# Patient Record
Sex: Male | Born: 1965
Health system: Southern US, Community
[De-identification: ages and names within clinical notes are randomized; demographics above are authoritative.]

## PROBLEM LIST (undated history)

## (undated) DIAGNOSIS — S069X9A Unspecified intracranial injury with loss of consciousness of unspecified duration, initial encounter: Secondary | ICD-10-CM

## (undated) DIAGNOSIS — R569 Unspecified convulsions: Secondary | ICD-10-CM

## (undated) DIAGNOSIS — E119 Type 2 diabetes mellitus without complications: Secondary | ICD-10-CM

## (undated) HISTORY — DX: Type 2 diabetes mellitus without complications: E11.9

---

## 2005-01-09 ENCOUNTER — Ambulatory Visit: Payer: Self-pay | Admitting: Internal Medicine

## 2005-02-02 ENCOUNTER — Ambulatory Visit: Payer: Self-pay | Admitting: Internal Medicine

## 2005-02-18 ENCOUNTER — Ambulatory Visit: Payer: Self-pay | Admitting: Internal Medicine

## 2013-10-19 ENCOUNTER — Encounter (HOSPITAL_COMMUNITY): Payer: Self-pay | Admitting: Emergency Medicine

## 2013-10-19 ENCOUNTER — Emergency Department (HOSPITAL_COMMUNITY)
Admission: EM | Admit: 2013-10-19 | Discharge: 2013-10-19 | Disposition: A | Discharge status: Home / Self Care | Payer: BC Managed Care – PPO | Attending: Emergency Medicine | Admitting: Emergency Medicine

## 2013-10-19 DIAGNOSIS — Z792 Long term (current) use of antibiotics: Secondary | ICD-10-CM | POA: Insufficient documentation

## 2013-10-19 DIAGNOSIS — L723 Sebaceous cyst: Secondary | ICD-10-CM | POA: Insufficient documentation

## 2013-10-19 DIAGNOSIS — L720 Epidermal cyst: Secondary | ICD-10-CM

## 2013-10-19 DIAGNOSIS — L0211 Cutaneous abscess of neck: Secondary | ICD-10-CM | POA: Insufficient documentation

## 2013-10-19 DIAGNOSIS — M549 Dorsalgia, unspecified: Secondary | ICD-10-CM | POA: Insufficient documentation

## 2013-10-19 LAB — CBC WITH DIFFERENTIAL/PLATELET
Basophils Relative: 0 % (ref 0–1)
Eosinophils Absolute: 0.1 10*3/uL (ref 0.0–0.7)
Eosinophils Relative: 1 % (ref 0–5)
HCT: 41.2 % (ref 39.0–52.0)
Hemoglobin: 15.2 g/dL (ref 13.0–17.0)
MCH: 30.6 pg (ref 26.0–34.0)
MCHC: 36.9 g/dL — ABNORMAL HIGH (ref 30.0–36.0)
MCV: 83.1 fL (ref 78.0–100.0)
Monocytes Absolute: 0.3 10*3/uL (ref 0.1–1.0)
Monocytes Relative: 4 % (ref 3–12)
Neutro Abs: 5.6 10*3/uL (ref 1.7–7.7)
RBC: 4.96 MIL/uL (ref 4.22–5.81)

## 2013-10-19 MED ORDER — CLINDAMYCIN HCL 150 MG PO CAPS: 450.0000 mg | ORAL_CAPSULE | Freq: Three times a day (TID) | ORAL | Status: DC

## 2013-10-19 MED ORDER — SODIUM CHLORIDE 0.9 % IV BOLUS (SEPSIS)
1000.0000 mL | Freq: Once | INTRAVENOUS | Status: AC
  Administered 2013-10-19: 1000 mL via INTRAVENOUS

## 2013-10-19 NOTE — ED Notes (Signed)
Pt has abscess to back of neck and upper back, sts comes and goes, now has become painful.

## 2013-10-19 NOTE — ED Provider Notes (Signed)
CSN: [id number]     Arrival date & time 10/19/13  4098 History  This chart was scribed for non-physician practitioner, Raymon Mutton, PA-C working with Raeford Razor, MD by Greggory Stallion, ED scribe. This patient was seen in room TR08C/TR08C and the patient's care was started at 10:29 AM.   Chief Complaint  Patient presents with  . Neck Pain  . Abscess   The history is provided by the patient. No language interpreter was used.   HPI Comments: [name] is a 47 y.o. male who presents to the Emergency Department complaining of a recurrent abscess to the back of his neck that started 2-3 weeks ago. He states it has worsened and become more painful in the last 3 days. Pt states palpation causes neck pain and upper back pain. Denies drainage. Denies fever, chills, nausea, emesis, neck stiffness, abnormal weight change. Pt denies family history of cancer.   History reviewed. No pertinent past medical history. History reviewed. No pertinent past surgical history. History reviewed. No pertinent family history. History  Substance Use Topics  . Smoking status: Never Smoker   . Smokeless tobacco: Not on file  . Alcohol Use: Yes     Comment: socially    Review of Systems  Constitutional: Negative for fever, chills and unexpected weight change.  Gastrointestinal: Negative for nausea and vomiting.  Musculoskeletal: Positive for back pain and neck pain. Negative for neck stiffness.  Skin:       Abscess  All other systems reviewed and are negative.    Allergies  Review of patient's allergies indicates no known allergies.  Home Medications   Current Outpatient Rx  Name  Route  Sig  Dispense  Refill  . clindamycin (CLEOCIN) 150 MG capsule   Oral   Take 3 capsules (450 mg total) by mouth 3 (three) times daily.   90 capsule   0     BP 133/79  Pulse 79  Temp(Src) 98 F (36.7 C) (Oral)  Resp 20  Ht 5\' 2"  (1.575 m)  Wt 160 lb (72.576 kg)  BMI 29.26 kg/m2  SpO2  100%  Physical Exam  Nursing note and vitals reviewed. Constitutional: He is oriented to person, place, and time. He appears well-developed and well-nourished. No distress.  HENT:  Head: Normocephalic and atraumatic.  Eyes: EOM are normal. Right eye exhibits no discharge. Left eye exhibits no discharge.  Neck: Normal range of motion. Neck supple. No tracheal deviation present.    Negative neck stiffness Negative nuchal rigidity Negative pain upon palpation to the c-spine  Cardiovascular: Normal rate, regular rhythm and normal heart sounds.  Exam reveals no friction rub.   No murmur heard. Pulses:      Radial pulses are 2+ on the right side, and 2+ on the left side.  Pulmonary/Chest: Effort normal and breath sounds normal. No respiratory distress. He has no wheezes. He has no rales.  Musculoskeletal: Normal range of motion.  Full ROM to upper and lower extremities bilaterally without difficulty noted  Neurological: He is alert and oriented to person, place, and time. He exhibits normal muscle tone. Coordination normal.  Strength 5+/5+ to upper extremities bilaterally with resistance applied, equal distribution noted   Skin: Skin is warm and dry.  Approximately 3 cm x 3 cm indurated lesion localized nears the C7 prominence, leftward deviation from the spine. Negative erythema, inflammation, drainage, bleeding, pustule, warmth upon palpation. Pain upon palpation to the lesion.   Psychiatric: He has a normal mood and affect. His  behavior is normal.    ED Course  Procedures (including critical care time)  DIAGNOSTIC STUDIES: Oxygen Saturation is 100% on RA, normal by my interpretation.    COORDINATION OF CARE: 10:32 AM-Discussed treatment plan which includes I&D with pt at bedside and pt agreed to plan.   INCISION AND DRAINAGE Performed by: Raymon Mutton, PA-C Consent: Verbal consent obtained. Risks and benefits: risks, benefits and alternatives were discussed Type:  abscess Body area: nape of neck, near C7 prominence Anesthesia: local infiltration Incision was made with a scalpel. Local anesthetic: lidocaine 2% with epinephrine Anesthetic total: 1 ml Complexity: complex Blunt dissection to break up loculations Drainage: purulent Drainage amount: 10-15 mL Patient tolerance: Patient tolerated the procedure well with no immediate complications.  12:10 PM Patient reported that he started to feel dizzy. Blood pressure was taken and was 90/64. Patient given fluids and sandwich PO and monitored. Blood pressure continued to be low and patient continued to report that he felt dizzy. Patient placed on IV fluids and monitored blood pressure.   2:07 PM Patient reported that he was feeling better after 2L of IV fluids. Patient reported that he did not eat or drink anything this morning. Blood pressure improved to 114/72. Patient walking around without any neurological deficits noted to gait. Patient reported that he is feeling better and ready to go home. Discussed instructions with patient and wound care. Patient discharged.   Labs Review Labs Reviewed  CBC WITH DIFFERENTIAL - Abnormal; Notable for the following:    MCHC 36.9 (*)    All other components within normal limits  WOUND CULTURE   Imaging Review No results found.  EKG Interpretation   None         MDM   1. Epidermal cyst     Filed Vitals:   10/19/13 1311 10/19/13 1314 10/19/13 1317 10/19/13 1357  BP: 112/70 118/74 108/64 114/72  Pulse: 73 66 68 70  Temp:      TempSrc:      Resp: 16 16 16 16   Height:      Weight:      SpO2: 100% 100% 100% 100%   I personally performed the services described in this documentation, which was scribed in my presence. The recorded information has been reviewed and is accurate.  Patient presenting to emergency department with lesion localized to the back of the neck that has been ongoing for the past 2 weeks. Patient reports that it hurts with  palpation. Reported that the lesion has gotten progressively larger.  Negative drainage, erythema, inflammation, fevers, neck stiffness. Alert and oriented. GCS 15. Heart rate and rhythm normal. Lungs clear to auscultation upper and lower lobes bilaterally. Pulses palpable and strong, radial 2+ bilaterally. Full range of motion to the neck. Cranial nerves grossly intact. Negative cervical lymphadenopathy identified. Approximately 3 cm x 3 cm lesion localized to the C7 prominence, leftward deviation. Indurated. Negative erythema, negative swelling, negative inflammation noted. Negative warmth upon palpation. Incision and drainage performed with immense amount of pus removed and drained. Irrigated and cleaned thoroughly. Wound bandaged. Patient tolerated procedure well. Shortly before discharge patient reported that he felt dizzy. Blood pressure taken and was recorded to be 90/64. Patient given food and fluids PO - continued to have dizziness and low blood pressure. Patient placed on IV fluids and monitored. Patient tolerated and responded well to the 2 L of IV fluids. Patient felt better and blood pressure improved. Blood pressure 114/72 upon discharge.  Suspicion to be epidermal cyst. Doubt  cancer. Doubt tumor. Doubt abscess.Patient stable, afebrile. Discharged patient. Discussed with patient proper hygiene techniques. Discussed with patient to closely monitor symptoms and if symptoms are to worsen or change report back to emergency department - strict return structures given. Patient agreed to plan of care, understood, all questions answered.  Raymon Mutton, PA-C 10/20/13 [address] PA-C 10/20/13 1258

## 2013-10-22 LAB — WOUND CULTURE
Culture: NO GROWTH
Gram Stain: NONE SEEN

## 2013-10-27 NOTE — ED Provider Notes (Signed)
Medical screening examination/treatment/procedure(s) were performed by non-physician practitioner and as supervising physician I was immediately available for consultation/collaboration.  EKG Interpretation    Date/Time:  Thursday October 19 2013 12:28:51 EST Ventricular Rate:  69 PR Interval:  178 QRS Duration: 84 QT Interval:  394 QTC Calculation: 422 R Axis:   55 Text Interpretation:  Normal sinus rhythm Normal ECG ED PHYSICIAN INTERPRETATION AVAILABLE IN [facility] Confirmed by TEST, RECORD ([id number]) on 10/21/2013 8:50:40 AM             Raeford Razor, MD 10/27/13 2158

## 2014-12-31 ENCOUNTER — Ambulatory Visit (INDEPENDENT_AMBULATORY_CARE_PROVIDER_SITE_OTHER): Payer: Self-pay | Admitting: Physician Assistant

## 2014-12-31 VITALS — BP 118/78 | HR 86 | Temp 98.1°F | Resp 16 | Ht 64.75 in | Wt 156.2 lb

## 2014-12-31 DIAGNOSIS — K047 Periapical abscess without sinus: Secondary | ICD-10-CM

## 2014-12-31 DIAGNOSIS — K088 Other specified disorders of teeth and supporting structures: Secondary | ICD-10-CM

## 2014-12-31 DIAGNOSIS — K089 Disorder of teeth and supporting structures, unspecified: Secondary | ICD-10-CM

## 2014-12-31 DIAGNOSIS — K0889 Other specified disorders of teeth and supporting structures: Secondary | ICD-10-CM

## 2014-12-31 MED ORDER — NAPROXEN 500 MG PO TABS: 500.0000 mg | ORAL_TABLET | Freq: Two times a day (BID) | ORAL | Status: DC

## 2014-12-31 MED ORDER — AMOXICILLIN-POT CLAVULANATE 875-125 MG PO TABS: 1.0000 | ORAL_TABLET | Freq: Two times a day (BID) | ORAL | Status: AC

## 2014-12-31 NOTE — Patient Instructions (Signed)
[facility] at [phone] for an appointment.

## 2014-12-31 NOTE — Progress Notes (Signed)
   12/31/2014 at 11:29 AM  [name] / DOB: 1966-07-11 / MRN: [id number]  The patient  does not have a problem list on file.  SUBJECTIVE  Chief compalaint: Dental Pain   History of present illness: [name] is 49 y.o. well appearing male presenting for teeth pain which he states is moderate. This started 7 days ago and is worsening . Associated symptoms include nothing.  He denies difficulty eating, ear pain, change in hearing, nasal and eye symptoms, fevers, chills presyncope, and diaphoresis. Marland Kitchen.  He has tried nothing for this problem. He would have gone to a dentist first, however, he suspected an abscess, as he had on 6 years ago with a similar presentation, and was told he needed antibiotics before any procedure.  His last dental check up was 2 years ago and was "normal."   He  has a past medical history of Diabetes mellitus without complication.    He currently has no medications in their medication list.  [name] has No Known Allergies. He  reports that he has never smoked. He does not have any smokeless tobacco history on file. He reports that he drinks alcohol. He reports that he does not use illicit drugs. He  has no sexual activity history on file. The patient  has no past surgical history on file.  His family history includes Diabetes in his father and mother.  ROS  Per HPI   OBJECTIVE  His  height is 5' 4.75" (1.645 m) and weight is 156 lb 3.2 oz (70.852 kg). His oral temperature is 98.1 F (36.7 C). His blood pressure is 118/78 and his pulse is 86. His respiration is 16 and oxygen saturation is 98%.  The patient's body mass index is 26.18 kg/(m^2).  Physical Exam  Constitutional: Vital signs are normal. He appears well-developed and well-nourished. No distress.  HENT:  Mouth/Throat: Uvula is midline, oropharynx is clear and moist and mucous membranes are normal. He does not have dentures. Dental caries present. No uvula swelling.    Skin: He is not diaphoretic.     No results found for this or any previous visit (from the past 24 hour(s)).  ASSESSMENT & PLAN  [name] was seen today for dental pain.  Diagnoses and all orders for this visit:  Dental abscess: Orders: -     amoxicillin-clavulanate (AUGMENTIN) 875-125 MG per tablet; Take 1 tablet by mouth 2 (two) times daily.  Pain in a tooth or teeth Orders: -     naproxen (NAPROSYN) 500 MG tablet; Take 1 tablet (500 mg total) by mouth 2 (two) times daily with a meal.  Poor dentition: [facility]      The patient was advised to call or come back to clinic if he does not see an improvement in symptoms, or worsens with the above plan.   Deliah BostonMichael Clark, MHS, PA-C [facility] 12/31/2014 11:29 AM

## 2015-06-13 ENCOUNTER — Ambulatory Visit (INDEPENDENT_AMBULATORY_CARE_PROVIDER_SITE_OTHER): Payer: Self-pay | Admitting: Urgent Care

## 2015-06-13 ENCOUNTER — Telehealth: Payer: Self-pay | Admitting: Urgent Care

## 2015-06-13 VITALS — BP 122/68 | HR 72 | Temp 97.2°F | Resp 15 | Ht 64.5 in | Wt 155.0 lb

## 2015-06-13 DIAGNOSIS — E1165 Type 2 diabetes mellitus with hyperglycemia: Secondary | ICD-10-CM

## 2015-06-13 DIAGNOSIS — M7750 Other enthesopathy of unspecified foot: Secondary | ICD-10-CM

## 2015-06-13 DIAGNOSIS — M6588 Other synovitis and tenosynovitis, other site: Secondary | ICD-10-CM

## 2015-06-13 DIAGNOSIS — N529 Male erectile dysfunction, unspecified: Secondary | ICD-10-CM

## 2015-06-13 DIAGNOSIS — Z833 Family history of diabetes mellitus: Secondary | ICD-10-CM

## 2015-06-13 DIAGNOSIS — IMO0002 Reserved for concepts with insufficient information to code with codable children: Secondary | ICD-10-CM

## 2015-06-13 DIAGNOSIS — E781 Pure hyperglyceridemia: Secondary | ICD-10-CM

## 2015-06-13 LAB — COMPREHENSIVE METABOLIC PANEL
ALBUMIN: 4.1 g/dL (ref 3.6–5.1)
ALT: 18 U/L (ref 9–46)
AST: 12 U/L (ref 10–40)
Alkaline Phosphatase: 55 U/L (ref 40–115)
BUN: 17 mg/dL (ref 7–25)
CO2: 26 mmol/L (ref 20–31)
Calcium: 8.7 mg/dL (ref 8.6–10.3)
Chloride: 103 mmol/L (ref 98–110)
Creat: 0.73 mg/dL (ref 0.60–1.35)
Glucose, Bld: 214 mg/dL — ABNORMAL HIGH (ref 65–99)
POTASSIUM: 4.4 mmol/L (ref 3.5–5.3)
Sodium: 136 mmol/L (ref 135–146)
Total Bilirubin: 0.7 mg/dL (ref 0.2–1.2)
Total Protein: 6.9 g/dL (ref 6.1–8.1)

## 2015-06-13 LAB — LIPID PANEL
CHOLESTEROL: 229 mg/dL — AB (ref 125–200)
HDL: 45 mg/dL (ref >=40)
LDL CALC: 106 mg/dL (ref <130)
TRIGLYCERIDES: 389 mg/dL — AB (ref <150)
Total CHOL/HDL Ratio: 5.1 Ratio — ABNORMAL HIGH (ref <=5.0)
VLDL: 78 mg/dL — AB (ref <30)

## 2015-06-13 LAB — POCT GLYCOSYLATED HEMOGLOBIN (HGB A1C): Hemoglobin A1C: 8

## 2015-06-13 MED ORDER — SILDENAFIL CITRATE 100 MG PO TABS: 50.0000 mg | ORAL_TABLET | Freq: Every day | ORAL | Status: DC | PRN

## 2015-06-13 MED ORDER — FENOFIBRATE 54 MG PO TABS: 54.0000 mg | ORAL_TABLET | Freq: Every day | ORAL | Status: DC

## 2015-06-13 MED ORDER — METFORMIN HCL 500 MG PO TABS: 500.0000 mg | ORAL_TABLET | Freq: Two times a day (BID) | ORAL | Status: DC

## 2015-06-13 NOTE — Telephone Encounter (Signed)
Reported CMet was unremarkable including electrolytes, blood sugar, liver enzymes and kidney function. Patient is to start fenofibrate for his hypertriglyceridemia. Script sent electronically. Recheck lipids in 6 months, diabetes f/u in 3 months.

## 2015-06-13 NOTE — Progress Notes (Signed)
MRN: [id number] DOB: [birth year]  Subjective:   [name] is a 49 y.o. male presenting for chief complaint of Follow-up  Diabetes - reports that he would like to be checked for diabetes. Has a family history with his father, mother, siblings. Also reports an unhealthy diet but does exercise 2-3 times per week. He drinks 1-2 beers per week. Denies polydipsia, polyuria, polyphagia, blurred vision, chest pain, shob, abdominal pain, numbness and tingling.  Foot issue - reports that he has intermittent bilateral plantar tenderness. 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 Has tried ibuprofen intermittently. Denies ankle pain, toe pain, numbness or tingling, decreased ROM.  ED - reports several month history of difficulty obtaining and maintaning an erection. He denies history of ED. Denies decreased libido. Denies any other aggravating or relieving factors, no other questions or concerns.  [name] has a current medication list which includes the following prescription(s): naproxen. Also has No Known Allergies.  [name]  has a past medical history of Diabetes mellitus without complication. Also  has no past surgical history on file.  Objective:   Vitals: BP 122/68 mmHg  Pulse 72  Temp(Src) 97.2 F (36.2 C) (Oral)  Resp 15  Ht 5' 4.5" (1.638 m)  Wt 155 lb (70.308 kg)  BMI 26.20 kg/m2  SpO2 98%  Physical Exam  Constitutional: He is oriented to person, place, and time. He appears well-developed and well-nourished.  HENT:  Mouth/Throat: Oropharynx is clear and moist.  Eyes: Conjunctivae are normal. Pupils are equal, round, and reactive to light. No scleral icterus.  Cardiovascular: Normal rate, regular rhythm and intact distal pulses.  Exam reveals no gallop and no friction rub.   No murmur heard. Pulmonary/Chest: No respiratory distress. He has no wheezes. He has no rales.  Abdominal: Soft. Bowel sounds are normal. He exhibits no distension and no  mass. There is no tenderness.  Musculoskeletal: Normal range of motion. He exhibits no edema.       Right foot: There is tenderness (over plantar surface bilaterally). There is normal range of motion, no bony tenderness, no swelling, normal capillary refill, no crepitus, no deformity and no laceration.       Left foot: There is tenderness (over plantar surface bilaterally). There is normal range of motion, no bony tenderness, no swelling, normal capillary refill, no crepitus, no deformity and no laceration.  Neurological: He is alert and oriented to person, place, and time.  Skin: Skin is warm and dry. No rash noted. No erythema. No pallor.   Results for orders placed or performed in visit on 06/13/15 (from the past 24 hour(s))  POCT glycosylated hemoglobin (Hb A1C)     Status: Abnormal   Collection Time: 06/13/15 11:14 AM  Result Value Ref Range   Hemoglobin A1C 8.0    Assessment and Plan :   1. Diabetes type 2, uncontrolled 2. Family history of diabetes mellitus - Start Metformin, advised dietary modifications and continue regular exercise. Follow up in 3 months. - metFORMIN (GLUCOPHAGE) 500 MG tablet; Take 1 tablet (500 mg total) by mouth 2 (two) times daily with a meal.  Dispense: 180 tablet; Refill: 1  3. Erectile dysfunction, unspecified erectile dysfunction type - Labs pending, offered Viagra but will draw testosterone level if lipid panel is normal since patient is paying out of pocket. - Comprehensive metabolic panel - Lipid panel - sildenafil (VIAGRA) 100 MG tablet; Take 0.5 tablets (50 mg total) by mouth daily as needed for  erectile dysfunction.  Dispense: 5 tablet; Refill: 11  4. Tendinitis of foot - Recommended conservative management, wear plantar supports, ice bath for feet, NSAIDs. Recheck in 3 months  Wallis Bamberg, PA-C [facility] [phone] 06/13/2015 10:40 AM

## 2015-06-13 NOTE — Patient Instructions (Signed)
Diabetes mellitus tipo2 (Type 2 Diabetes Mellitus) La diabetes mellitus tipo2, generalmente denominada diabetes tipo2, es una enfermedad prolongada (crnica). En la diabetes tipo2, el pncreas no produce suficiente insulina (una hormona), las clulas son menos sensibles a la insulina que se produce (resistencia a la insulina), o ambos. Normalmente, la insulina mueve los azcares de los alimentos a las clulas de los tejidos. Las clulas de los tejidos utilizan los azcares para obtener energa. La falta de insulina o la falta de una respuesta normal a la insulina hace que el exceso de azcar se acumule en la sangre en lugar de penetrar en las clulas de los tejidos. Como resultado, se producen niveles altos de azcar en la sangre (hiperglucemia). El efecto de los niveles altos de azcar (glucosa) puede causar muchas complicaciones.  La diabetes tipo2 antes tambin se denominaba diabetes del adulto, pero puede ocurrir a cualquier edad.  FACTORES DE RIESGO  Una persona tiene mayor predisposicin a desarrollar diabetes tipo 2 si alguien en su familia tiene la enfermedad y tambin tiene uno o ms de los siguientes factores de riesgo principales:  Sobrepeso.  Estilo de vida sedentario.  Una historia de consumo constante de alimentos de altas caloras. Mantener un peso saludable y realizar actividad fsica regular puede reducir la probabilidad de desarrollar diabetes tipo 2. SNTOMAS  Una persona con diabetes tipo 2 no presenta sntomas en un principio. Los sntomas de la diabetes tipo 2 aparecen lentamente. Los sntomas son:  Aumento de la sed (polidipsia).  Aumento de la miccin (poliuria).  Orina con ms frecuencia durante la noche (nocturia).  Prdida de peso. La prdida de peso puede ser muy rpida.  Infecciones frecuentes y recurrentes.  Cansancio (fatiga).  Debilidad.  Cambios en la visin, como visin borrosa.  Olor a fruta en el aliento.  Dolor abdominal.  Nuseas o  vmitos.  Cortes o moretones que tardan en sanarse.  Hormigueo o adormecimiento de las manos y los pies. DIAGNSTICO Con frecuencia la diabetes tipo 2 no se diagnostica hasta que se presentan las complicaciones de la diabetes. La diabetes tipo 2 se diagnostica cuando los sntomas o las complicaciones se presentan y cuando aumentan los niveles de glucosa en la sangre. El nivel de glucosa en la sangre puede controlarse en uno o ms de los siguientes anlisis de sangre:  Medicin de glucosa en la sangre en ayunas. No se le permitir comer durante al menos 8 horas antes de que se tome una muestra de sangre.  Pruebas al azar de glucosa en la sangre. El nivel de glucosa en la sangre se controla en cualquier momento del da sin importar el momento en que haya comido.  Prueba de A1c (hemoglobina glucosilada) Una prueba de A1c proporciona informacin sobre el control de la glucosa en la sangre durante los ltimos 3 meses.  Prueba de tolerancia a la glucosa oral (PTGO). La glucosa en la sangre se mide despus de no haber comido (ayunas) durante dos horas y despus de beber una bebida que contenga glucosa. TRATAMIENTO   Usted puede necesitar administrarse insulina o medicamentos para la diabetes todos los das para mantener los niveles de glucosa en la sangre en el rango deseado.  Si usa insulina, tal vez necesite ajustar la dosis segn los carbohidratos que haya consumido en cada comida o colacin. El objetivo del tratamiento es mantener el nivel de azcar en la sangre previo a comer (glucosa preprandial) entre 70 y 130mg/dl. INSTRUCCIONES PARA EL CUIDADO EN EL HOGAR   Controle su nivel de   hemoglobina A1c dos veces al ao.  Contrlese a diario Retail buyer de glucosa en la sangre segn las indicaciones de su mdico.  Supervise las cetonas en la orina cuando est enferma y segn las indicaciones de su [place] medicamento para la diabetes o adminstrese insulina segn las indicaciones de su  mdico para Contractor nivel de glucosa en la sangre en el rango deseado.  Nunca se quede sin medicamento para la diabetes o sin insulina. Es necesario que la reciba [employer].  Si [place] insulina, tal vez deba ajustar la cantidad de insulina administrada segn los carbohidratos consumidos. Los hidratos de carbono pueden aumentar los niveles de glucosa en la sangre, pero deben incluirse en su dieta. Los hidratos de carbono aportan vitaminas, minerales y [place] que son Ardelia Mems parte esencial de una dieta saludable. Los hidratos de carbono se encuentran en frutas, verduras, cereales integrales, productos lcteos, legumbres y alimentos que contienen azcares aadidos.  Consuma alimentos saludables. Programe una cita con un nutricionista certificado para que lo ayude a Building services engineer de alimentacin adecuado para usted.  Baje de peso si es necesario.  Lleve una tarjeta de alerta mdica o use una pulsera o medalla de alerta mdica.  Lleve con usted una colacin de 15gramos de hidratos de carbono en todo momento para controlar los niveles bajos de glucosa en la sangre (hipoglucemia). Algunos ejemplos de colaciones de 15gramos de hidratos de carbono son los siguientes:  Tabletas de glucosa, 3 o 4.  Gel de glucosa, tubo de 15 gramos.  Pasas de uva, 2 cucharadas (24 gramos).  Caramelos de goma, 6.  Galletas de [place], 8.  Gaseosa comn, 4onzas (152mlilitros).  Pastillas de goma, 9.  Reconocer la hipoglucemia. La hipoglucemia se produce cuando los niveles de glucosa en la sangre son de 70 mg/dl o menos. El riesgo de hipoglucemia aumenta durante el ayuno o cuando se saltea las comidas, durante o despus de rOptometristejercicio intenso y mCornvilleduerme. Los sntomas de hipoglucemia son:  Temblores o sacudidas.  Disminucin de la capacidad de concentracin.  Sudoracin.  Aumento de la frecuencia cardaca.  Dolor de c[place]  Sequedad en la  boca.  Hambre.  Irritabilidad.  Ansiedad.  Sueo agitado.  Alteracin del habla o de la coordinacin.  Confusin.  Tratar la hipoglucemia rpidamente. Si usted est alerta y puede tragar con seguridad, siga la regla de 15/15 que consiste en:  TMerck & Co15 y 20gramos de glucosa de accin rpida o carbohidratos. Las opciones de accin rpida son un gel de glucosa, tabletas de glucosa, o 4 onzas (120 ml) de jugo de frutas, gaseosa comn, o leche baja en grasa.  Compruebe su nivel de glucosa en la sangre 15 minutos despus de tomar la glucosa.  Tome entre 15 y 264gramos ms de glucosa si el nivel de glucosa en la sangre todava es de 753mdl o inferior.  Ingiera una comida o una colacin en el lapso de 1 hora una vez que los niveles de glucosa en la sangre vuelven a la normalidad.  Est atento a si siente mucha sed u orina con mayor frecuencia, porque son signos tempranos de hiperglucemia. El reconocimiento temprano de la hiperglucemia permite un tratamiento oportuno. Trate la hiperglucemia segn le indic su mdico.  Haga, al menos, 15060mtos de actividad fsica moderada a la semana, distribuidos en, por lo menos, 3das a la semana o como lo indique su mdico. Adems, debe realizar ejercicios de resistencia por lo menos 2veces a la semEntergy Corporation  o como lo indique su mdico. Trate de no permanecer inactivo durante ms de seguidos.  Ajuste su medicamento y la ingesta de alimentos, segn sea necesario, si inicia un nuevo ejercicio o deporte.  Siga su plan para los [address] de enfermedad cuando no puede comer o beber como de [place].  No consuma ningn producto que contenga tabaco, como cigarrillos, tabaco de Theatre manager o Administrator, Civil Service. Si necesita ayuda para dejar de fumar, hable con el mdico.  Limite el consumo de alcohol a no ms de 1 medida por da en las mujeres no embarazadas y 2 medidas en los hombres. Debe beber alcohol solo mientras come. Hable con su mdico  acerca de si el alcohol es seguro para usted. Informe a su mdico si bebe alcohol varias veces a la semana.  Concurra a todas las visitas de control como se lo haya indicado el mdico. Esto es importante.  Programe un examen de la vista poco despus del diagnstico de diabetes tipo 2 y luego anualmente.  Realice diariamente el cuidado de la piel y de los pies. Examine su piel y los pies diariamente para ver si tiene cortes, moretones, enrojecimiento, problemas en las uas, sangrado, ampollas o Advertising account planner. Su mdico debe hacerle un examen de los pies una vez por ao.  Cepllese los dientes y encas por lo menos dos veces al da y use hilo dental al menos una vez por da. Concurra regularmente a las visitas de control con el dentista.  Comparta su plan de control de diabetes en el trabajo o en la escuela.  Mantngase al da con las vacunas. Se recomienda que las [employer] de [id number] con diabetes se apliquen la vacuna contra la neumona. En algunos casos, pueden administrarse dos inyecciones separadas. Pregntele al mdico si tiene la vacuna contra la neumona al da.  Aprenda a Dealer.  Obtenga la mayor cantidad posible de informacin sobre la diabetes y solicite ayuda siempre que sea necesario.  Busque programas de rehabilitacin y participe en ellos para mantener o mejorar su independencia y su calidad de vida. Solicite la derivacin a fisioterapia o terapia ocupacional si se le [facility] o la mano, o tiene problemas para asearse, vestirse, comer, o durante la [place] fsica. SOLICITE ATENCIN MDICA SI:   No puede comer alimentos o beber por ms de 6 horas.  Tuvo nuseas o ha vomitado durante ms de 6 horas.  Su nivel de glucosa en la sangre es mayor de 240 mg/dl.  Presenta algn cambio en el estado mental.  Desarrolla una enfermedad grave adicional.  Tuvo diarrea durante ms de 6 horas.  Ha estado enfermo o ha tenido fiebre durante un par [address] y no  mejora.  Siente dolor al practicar cualquier actividad fsica. SOLICITE ATENCIN MDICA DE INMEDIATO SI:  Tiene dificultad para respirar.  Tiene niveles de cetonas moderados a altos. ASEGRESE DE QUE:  Comprende estas instrucciones.  Controlar su afeccin.  Recibir ayuda de inmediato si no mejora o si empeora. Document Released: 10/19/2005 Document Revised: 03/05/2014 [facility] Patient Information 2015 [place], [place]. This information is not intended to replace advice given to you by your health care provider. Make sure you discuss any questions you have with your health care provider.    La diabetes mellitus y los alimentos (Diabetes Mellitus and Food) Es importante que controle su nivel de azcar en la sangre (glucosa). El nivel de glucosa en sangre depende en gran medida de lo que usted come. Comer alimentos saludables en las  cantidades [place] a lo largo del Futures trader, aproximadamente a la [employer], lo ayudar a Chief Operating Officer su nivel de Event organiser. Tambin puede ayudarlo a retrasar o Fish farm manager de la diabetes mellitus. Comer de [employer] saludable incluso puede ayudarlo a Event organiser de presin arterial y a Barista o Pharmacologist un peso saludable.  CMO PUEDEN AFECTARME LOS ALIMENTOS? Carbohidratos Los carbohidratos afectan el nivel de glucosa en sangre ms que cualquier otro tipo de alimento. El nutricionista lo ayudar a Chief Strategy Officer cuntos carbohidratos puede consumir en cada comida y ensearle a contarlos. El recuento de carbohidratos es importante para mantener la glucosa en sangre en un nivel saludable, en especial si utiliza insulina o toma determinados medicamentos para la diabetes mellitus. Alcohol El alcohol puede provocar disminuciones sbitas de la glucosa en sangre (hipoglucemia), en especial si utiliza insulina o toma determinados medicamentos para la diabetes mellitus. La hipoglucemia es una afeccin que puede poner en peligro la vida. Los  sntomas de la hipoglucemia (somnolencia, mareos y Administrator) son similares a los sntomas de haber consumido mucho alcohol.  Si el mdico lo autoriza a beber alcohol, hgalo con moderacin y siga estas pautas:  Las mujeres no deben beber ms de un trago por da, y los hombres no deben beber ms de dos tragos por Futures trader. Un trago es igual a:  12 onzas (355 ml) de cerveza  5 onzas de vino (150 ml) de vino  1,5onzas (45ml) de bebidas espirituosas  No beba con el estmago vaco.  Mantngase hidratado. Beba agua, gaseosas dietticas o t helado sin azcar.  Las gaseosas comunes, los jugos y otros refrescos podran contener muchos carbohidratos y se Heritage manager. QU ALIMENTOS NO SE RECOMIENDAN? Cuando haga las elecciones de alimentos, es importante que recuerde que todos los alimentos son distintos. Algunos tienen menos nutrientes que otros por porcin, aunque podran tener la misma cantidad de caloras o carbohidratos. Es difcil darle al cuerpo lo que necesita cuando consume alimentos con menos nutrientes. Estos son algunos ejemplos de alimentos que debera evitar ya que contienen muchas caloras y carbohidratos, pero pocos nutrientes:  Neurosurgeon trans (la mayora de los alimentos procesados incluyen grasas trans en la etiqueta de Informacin nutricional).  Gaseosas comunes.  Jugos.  Caramelos.  Dulces, como tortas, pasteles, rosquillas y [place].  Comidas fritas. QU ALIMENTOS PUEDO COMER? Consuma alimentos ricos en nutrientes, que nutrirn el cuerpo y lo mantendrn saludable. Los alimentos que debe comer tambin dependern de varios factores, como:  Las caloras que necesita.  Los medicamentos que toma.  Su peso.  El nivel de glucosa en [place].  El [place] de presin arterial.  El nivel de colesterol. Tambin debe consumir una variedad de [place], como:  Protenas, como carne, aves, pescado, tofu, frutos secos y semillas (las protenas de [place] magros son  mejores).  Nils Pyle.  Verduras.  Productos lcteos, como [place], queso y yogur (descremados son mejores).  Panes, granos, pastas, cereales, arroz y frijoles.  Grasas, como aceite de [place], [place] sin grasas trans, aceite de canola, aguacate y [place]. TODOS LOS QUE PADECEN DIABETES MELLITUS TIENEN EL MISMO PLAN DE COMIDAS? Dado que todas las personas que padecen diabetes mellitus son distintas, no hay un solo plan de comidas que funcione para todos. Es muy importante que se rena con un nutricionista que lo ayudar a crear un plan de comidas adecuado para usted. Document Released: 01/26/2008 Document Revised: 10/24/2013 [facility] Patient Information 2015 [place], [place]. This information is not intended to replace advice given to  you by your health care provider. Make sure you discuss any questions you have with your health care provider.   Tendinitis (Tendinitis) La tendinitis es la hinchazn e inflamacin de los tendones. Los tendones son tejidos similares a una banda que conectan el msculo al [employer]. La tendinitis suele producirse en:   Los hombros (manguito rotador).  Los talones (tendn de [place]).  Los codos (tendn del trceps). CAUSAS La tendinitis suele originarse en el uso excesivo del tendn, los msculos y la articulacin involucrados. Cuando el tejido que rodea al tendn (la sinovia) se inflama, se denomina tenosinovitis. En general, la tendinitis se presenta en las personas que hacen trabajos que requieren movimientos repetitivos. SNTOMAS  Dolor.  Sensibilidad.  Inflamacin leve. DIAGNSTICO La tendinitis normalmente se diagnostica con un examen fsico. El mdico tambin podr solicitar radiografas u otros estudios de diagnstico por imgenes. TRATAMIENTO El mdico podr recomendar determinados medicamentos o ejercicios para Scientist, research (medical). INSTRUCCIONES PARA EL CUIDADO EN EL HOGAR   Use un cabestrillo o una frula durante todo el tiempo indicado por el  mdico hasta que Secretary/administrator.  Aplique hielo sobre la zona lesionada.  Ponga el hielo en una bolsa plstica.  Colquese una toalla entre la piel y la bolsa de hielo.  Aplique el hielo durante 15 a , 3 a 4veces por da, o como se lo haya indicado el mdico.  Evite utilizar la extremidad mientras sienta dolor en el tendn. Haga ejercicios suaves con amplitud de movimientos solamente como se los haya indicado el mdico. Suspenda los ejercicios si el dolor o las molestias [place], a menos que el mdico le indique otra cosa.  Utilice los medicamentos de venta libre o recetados para Primary school teacher, Environmental health practitioner o la fiebre, segn se lo indique el mdico. SOLICITE ATENCIN MDICA SI:   El dolor y la hinchazn aumentan.  Presenta sntomas nuevos o desconocidos, especialmente mayor adormecimiento en las manos. ASEGRESE DE QUE:   Comprende estas instrucciones.  Controlar su afeccin.  Recibir ayuda de inmediato si no mejora o si empeora. Document Released: 07/29/2005 Document Revised: 03/05/2014 [facility] Patient Information 2015 [place], [place]. This information is not intended to replace advice given to you by your health care provider. Make sure you discuss any questions you have with your health care provider.

## 2015-12-12 ENCOUNTER — Ambulatory Visit (INDEPENDENT_AMBULATORY_CARE_PROVIDER_SITE_OTHER): Payer: Self-pay | Admitting: Family Medicine

## 2015-12-12 VITALS — BP 132/70 | HR 75 | Temp 98.5°F | Resp 16 | Ht 64.0 in | Wt 158.0 lb

## 2015-12-12 DIAGNOSIS — IMO0001 Reserved for inherently not codable concepts without codable children: Secondary | ICD-10-CM

## 2015-12-12 DIAGNOSIS — N529 Male erectile dysfunction, unspecified: Secondary | ICD-10-CM

## 2015-12-12 DIAGNOSIS — K089 Disorder of teeth and supporting structures, unspecified: Secondary | ICD-10-CM

## 2015-12-12 DIAGNOSIS — E1165 Type 2 diabetes mellitus with hyperglycemia: Secondary | ICD-10-CM

## 2015-12-12 DIAGNOSIS — E781 Pure hyperglyceridemia: Secondary | ICD-10-CM

## 2015-12-12 LAB — POCT GLYCOSYLATED HEMOGLOBIN (HGB A1C): Hemoglobin A1C: 8.8

## 2015-12-12 MED ORDER — SITAGLIPTIN PHOS-METFORMIN HCL 50-1000 MG PO TABS: 1.0000 | ORAL_TABLET | Freq: Two times a day (BID) | ORAL | Status: DC

## 2015-12-12 MED ORDER — FENOFIBRATE 54 MG PO TABS: 54.0000 mg | ORAL_TABLET | Freq: Every day | ORAL | Status: DC

## 2015-12-12 MED ORDER — SILDENAFIL CITRATE 100 MG PO TABS: 50.0000 mg | ORAL_TABLET | Freq: Every day | ORAL | Status: DC | PRN

## 2015-12-12 MED ORDER — METFORMIN HCL 500 MG PO TABS: 500.0000 mg | ORAL_TABLET | Freq: Two times a day (BID) | ORAL | Status: DC

## 2015-12-12 NOTE — Patient Instructions (Signed)
Diabetes y normas bsicas de atencin mdica (Diabetes and Standards of Medical Care) La diabetes es una enfermedad complicada. El equipo que trate su diabetes deber incluir un nutricionista, un enfermero, un educador para la diabetes, un oftalmlogo y ms. Para que todas las personas conozcan sobre su enfermedad y para que los pacientes tengan los cuidados que necesitan, se crearon las siguientes normas bsicas para un mejor control. A continuacin se indican los estudios, vacunas, medicamentos, educacin y planes que necesitar. Prueba de HbA1c Esta prueba muestra cmo ha sido controlada su glucosa en los ltimos 2 o 3 meses. Se utiliza para verificar si el plan de control de la diabetes debe ser ajustado.   Hgalos al menos 2 veces al ao si cumple los objetivos del tratamiento.  Si le han cambiado el tratamiento o si no cumple con los objetivos del tratamiento, debe hacerlo 4 veces al ao. Control de la presin arterial.  Hgalas en cada visita mdica de rutina. El objetivo es tener menos de 140/90 mm Hg en la mayora de las personas, pero 130/80 mm Hg en algunos casos. Consulte a su mdico acerca de su objetivo. Examen dental.  Concurra regularmente a las visitas de control con el dentista. Examen ocular.  Si le diagnosticaron diabetes tipo 1 siendo un nio, debe hacerse estudios al llegar a los 10 aos o ms y si ha sufrido de diabetes durante 3 a 5 aos. Se recomienda hacer anualmente los exmenes oculares despus de ese examen inicial.  Si le diagnosticaron diabetes tipo 1 siendo adulto, hgase un examen dentro de los 5 aos del diagnstico y luego una vez por ao.  Si le diagnosticaron diabetes tipo 2, hgase un estudio lo antes posible despus del diagnstico y luego una vez por ao. Examen de los pies  Se har una inspeccin visual en cada visita mdica de rutina. Estos controles observarn si hay cortes, lesiones u otros problemas en los pies.  Debe realizarse un examen  completo de los pies cada ao. Esto incluye revisar la estructura y la piel de los pies, y examinar los pulsos y la sensacin de los pies.  Diabetes tipo 1: La primera prueba se realiza 5 aos despus del diagnstico.  Diabetes tipo 2: La primera prueba se realiza en el momento del diagnstico.  Contrlese los pies todas las noches para ver si hay cortes, lesiones u otros problemas. Comunquese con su mdico si observa que no se curan. Estudio de la funcin renal (microalbmina en orina)  Debe realizarse una vez por ao.  Diabetes tipo 1: La primera prueba se realiza a los 5 aos despus del diagnstico.  Diabetes tipo2: La primera prueba se realiza en el momento del diagnstico.  La creatinina srica y el ndice de filtracin glomerular estimada (eGFR, por sus siglas en ingls) se realizan una vez por ao para informar el nivel de enfermedad renal crnica, si la hubiera. Perfil lipdico (colesterol, HDL, LDL, triglicridos).  La mayora de las personas lo hacen cada 5 aos.  En relacin al LDL, el objetivo es tener menos de 100 mg/dl. Si tiene alto riesgo, el objetivo es tener menos de 70 mg/dl.  En relacin al HDL, el objetivo es tener entre 40 y 50 mg/dl para los hombres y entre 50 y 60 mg/dl para las mujeres. Un nivel de colesterol HDL de 60 mg/dl o superior da una cierta proteccin contra la enfermedad cardaca.  En relacin a los triglicridos, el objetivo es tener menos de 150 mg/dl. Vacunas  Se recomienda   aplicar de forma anual la vacuna contra la gripe a todas las personas de 6 meses en adelante que tengan diabetes.  La vacuna contra la neumona (antineumoccica) est recomendada para todas las personas de 2 aos en adelante que tengan diabetes. Los adultos de 65 aos o ms pueden recibir la vacuna antineumoccica como una serie de dos inyecciones diferentes.  Se recomienda administrar la vacuna contra la hepatitis B en adultos poco despus de que hayan recibido el  diagnstico de diabetes.  La vacuna Tdap (contra el ttanos, la difteria y la tosferina) debe aplicarse de la siguiente manera:  Segn las pautas normales de vacunacin infantil en el caso de los nios.  Cada 10 aos en el caso de los adultos con diabetes. Educacin para el autocontrol de la diabetes  Recomendaciones al momento del diagnstico y los controles segn sea necesario. Plan de tratamiento  Su plan de tratamiento ser revisado en cada visita mdica.   Esta informacin no tiene Marine scientist el consejo del mdico. Asegrese de hacerle al mdico cualquier pregunta que tenga.   Document Released: 01/13/2010 Document Revised: 11/09/2014 Elsevier Interactive Patient Education [employer]. Diabetes y cuidados del pie (Diabetes and Foot Care) La diabetes puede ser la causa de que el flujo sanguneo (circulacin) en las piernas y los pies sea deficiente. Debido a esto, la piel de los pies se torna ms delgada, se rompe con facilidad y se cura ms lentamente. La piel puede estar seca, despellejarse y Medical illustrator. Tambin pueden estar daados los nervios de las piernas y de los pies lo que provoca una disminucin de la sensibilidad. Es posible que no advierta heridas ms pequeas en los pies, que pueden causar infecciones graves. Cuidar sus pies es una de las cosas ms importantes que puede hacer por usted mismo.  INSTRUCCIONES PARA EL CUIDADO EN EL HOGAR  Use siempre calzado, an dentro de su casa. No camine descalzo. Caminar descalzo facilita que se lastime.  Controle sus pies diariamente para observar ampollas, cortes y enrojecimiento. Si no puede ver la planta del pie, use un espejo o pdale ayuda a Nurse, children's.  Lave sus pies con agua tibia (no use agua caliente) y un [place]. Seque bien sus pies, y la zona [employer] dedos dando [place], hasta que estn completamente secos. Noremoje los pies, ya que esto puede resecar la piel.  Aplique una locin hidratante o  vaselina (que no contenga alcohol ni perfume) en los pies y en las uas secas y [place]. No aplique locin entre los dedos.  Recorte las uas en forma recta. No escarbe debajo de las uas o alrededor [employer]. Lime los bordes de las uas con una lima o esmeril.  No corte las durezas o callosidades, ni trate de quitarlas con medicamentos.  Use calcetines de algodn o medias [employer]. Asegrese de que no le [employer]. Nouse calcetines que le lleguen a las rodillas, ya que podran disminuir el flujo de sangre a las piernas.  Use zapatos de cuero que le queden bien y que sean acolchados. Para amoldar los zapatos, clcelos slo algunas horas por da. Esto evitar lesiones en los pies. Revise siempre los zapatos antes de ponerlos para asegurarse de que no haya objetos en su interior.  No cruce las piernas. Esto puede disminuir el flujo de sangre a los pies.  Si algo le ha raspado, cortado o lastimado la piel de los pies, mantenga la piel de esa zona limpia y [place]. Debe higienizar  estas zonas con agua y un jabn suave. No limpie la zona con agua oxigenada, alcohol ni yodo.  Cuando se quite un vendaje adhesivo, asegrese de no daar la piel.  Si tiene una herida, obsrvela varias veces por da para asegurarse de que se est curando.  No use bolsas de agua caliente ni almohadillas trmicas. Podran causar quemaduras. Si ha perdido la sensibilidad en los pies o las piernas, no sabr lo que le est sucediendo hasta que sea demasiado tarde.  Asegrese de que su mdico le haga un examen completo de los pies por lo menos una vez al ao, o con ms frecuencia si usted tiene [employer]. Informe todos los cortes, llagas o moretones a su mdico inmediatamente. SOLICITE ATENCIN MDICA SI:   Tiene una lesin que no se cura.  Tiene cortes o rajaduras en la piel.  Tiene una ua encarnada.  Nota una zona irritada en las piernas o los pies.  Siente una sensacin de  ardor u hormigueo en las piernas o los pies.  Siente dolor o calambres en las piernas o los pies.  Las piernas o los pies estn adormecidos.  Siente los pies siempre fros. SOLICITE ATENCIN MDICA DE INMEDIATO SI:   Presenta enrojecimiento, hinchazn o aumento del dolor en una herida.  Nota una lnea roja que sube por pierna.  Aparece pus en la herida.  Le sube la fiebre o segn lo que le indique el mdico.  Advierte un olor ftido que proviene de una lcera o una herida.   Esta informacin no tiene Marine scientist el consejo del mdico. Asegrese de hacerle al mdico cualquier pregunta que tenga.   Document Released: 10/19/2005 Document Revised: 06/21/2013 Elsevier Interactive Patient Education [employer].

## 2015-12-12 NOTE — Progress Notes (Addendum)
By signing my name below, I, Rawaa Al Rifaie, attest that this documentation has been prepared under the direction and in the presence of Elvina Sidle, MD.  Watt Climes Rifaie, Medical Scribe. 12/12/2015.  10:05 AM.  Patient ID: [name] MRN: [id number], DOB: 07-06-1966, 50 y.o. Date of Encounter: 12/12/2015  Primary Physician: No PCP Per Patient  Chief Complaint:  Chief Complaint  Patient presents with  . Diabetes    check glucose levels  . Medication Refill    metformin    HPI:  [name] is a 50 y.o. male with a history of DM who presents to [facility] for a diabetes check.  Pt reports that his wife requested him to be checked for this. Pt is requesting a medication refill for metformin.  Pt is also complaining with difficulty keeping an erection for about 6 months now.   Pt is a spanish-speaking only. Pt works 2 jobs.    Past Medical History  Diagnosis Date  . Diabetes mellitus without complication (HCC)      Home Meds: Prior to Admission medications   Medication Sig Start Date End Date Taking? Authorizing Provider  metFORMIN (GLUCOPHAGE) 500 MG tablet Take 1 tablet (500 mg total) by mouth 2 (two) times daily with a meal. 06/13/15  Yes Wallis Bamberg, PA-C  fenofibrate 54 MG tablet Take 1 tablet (54 mg total) by mouth daily. 06/13/15   Wallis Bamberg, PA-C  naproxen (NAPROSYN) 500 MG tablet Take 1 tablet (500 mg total) by mouth 2 (two) times daily with a meal. Patient not taking: Reported on 06/13/2015 12/31/14   Ofilia Neas, PA-C  sildenafil (VIAGRA) 100 MG tablet Take 0.5 tablets (50 mg total) by mouth daily as needed for erectile dysfunction. 06/13/15   Wallis Bamberg, PA-C    Allergies: No Known Allergies  Social History   Social History  . Marital Status: Married    Spouse Name: N/A  . Number of Children: N/A  . Years of Education: N/A   Occupational History  . Not on file.   Social History Main Topics  . Smoking status: Never  Smoker   . Smokeless tobacco: Not on file  . Alcohol Use: Yes     Comment: socially  . Drug Use: No  . Sexual Activity: Not on file   Other Topics Concern  . Not on file   Social History Narrative     Review of Systems: Constitutional: negative for chills, fever, night sweats, weight changes, or fatigue  HEENT: negative for vision changes, hearing loss, congestion, rhinorrhea, ST, epistaxis, or sinus pressure Cardiovascular: negative for chest pain or palpitations Respiratory: negative for hemoptysis, wheezing, shortness of breath, or cough Abdominal: negative for abdominal pain, nausea, vomiting, diarrhea, or constipation Dermatological: negative for rash Neurologic: negative for headache, dizziness, or syncope All other systems reviewed and are otherwise negative with the exception to those above and in the HPI.  Physical Exam: Blood pressure 132/70, pulse 75, temperature 98.5 F (36.9 C), resp. rate 16, height  (1.626 m), weight 158 lb (71.668 kg), SpO2 97 %., Body mass index is 27.11 kg/(m^2). General: Well developed, well nourished, in no acute distress. Head: Normocephalic, atraumatic, eyes without discharge, sclera non-icteric, nares are without discharge. Bilateral auditory canals clear, TM's are without perforation, pearly grey and translucent with reflective cone of light bilaterally. Oral cavity moist, posterior pharynx without exudate, erythema, peritonsillar abscess, or post nasal drip. Poor dentition. Several dental carries.   Neck:  Supple. No thyromegaly. Full ROM. No lymphadenopathy. Lungs: Clear bilaterally to auscultation without wheezes, rales, or rhonchi. Breathing is unlabored. Heart: RRR with S1 S2. No murmurs, rubs, or gallops appreciated. Good PD pulse. Msk:  Strength and tone normal for age. Extremities/Skin: Warm and dry. No clubbing or cyanosis. No edema. No rashes or suspicious lesions. No signs of infection on the legs or feet.  Good pedal pulses and  no interdigital erythema on feet.  Normal sensation of feet Neuro: Alert and oriented X 3. Moves all extremities spontaneously. Gait is normal. CNII-XII grossly in tact. Psych:  Responds to questions appropriately with a normal affect.   Labs: Results for orders placed or performed in visit on 12/12/15  POCT glycosylated hemoglobin (Hb A1C)  Result Value Ref Range   Hemoglobin A1C 8.8      ASSESSMENT AND PLAN:  50 y.o. year old male with uncontrolled diabetes  This chart was scribed in my presence and reviewed by me personally.    ICD-9-CM ICD-10-CM   1. Uncontrolled type 2 diabetes mellitus without complication, without long-term current use of insulin (HCC) 250.02 E11.65 POCT glycosylated hemoglobin (Hb A1C)     metFORMIN (GLUCOPHAGE) 500 MG tablet     sitaGLIPtin-metformin (JANUMET) 50-1000 MG tablet  2. Poor dentition 525.9 K08.9 POCT glycosylated hemoglobin (Hb A1C)  3. Erectile dysfunction, unspecified erectile dysfunction type 607.84 N52.9 POCT glycosylated hemoglobin (Hb A1C)     sildenafil (VIAGRA) 100 MG tablet  4. Hypertriglyceridemia 272.1 E78.1 fenofibrate 54 MG tablet      Signed, Elvina Sidle, MD 12/12/2015 9:58 AM

## 2016-06-02 ENCOUNTER — Ambulatory Visit (INDEPENDENT_AMBULATORY_CARE_PROVIDER_SITE_OTHER): Payer: 59 | Admitting: Family Medicine

## 2016-06-02 VITALS — BP 116/80 | HR 86 | Temp 98.1°F | Resp 18 | Ht 64.0 in | Wt 154.0 lb

## 2016-06-02 DIAGNOSIS — E1165 Type 2 diabetes mellitus with hyperglycemia: Secondary | ICD-10-CM | POA: Diagnosis not present

## 2016-06-02 DIAGNOSIS — R809 Proteinuria, unspecified: Secondary | ICD-10-CM

## 2016-06-02 DIAGNOSIS — E785 Hyperlipidemia, unspecified: Secondary | ICD-10-CM

## 2016-06-02 DIAGNOSIS — E1129 Type 2 diabetes mellitus with other diabetic kidney complication: Secondary | ICD-10-CM

## 2016-06-02 DIAGNOSIS — IMO0001 Reserved for inherently not codable concepts without codable children: Secondary | ICD-10-CM

## 2016-06-02 LAB — COMPREHENSIVE METABOLIC PANEL
ALK PHOS: 54 U/L (ref 40–115)
ALT: 17 U/L (ref 9–46)
AST: 13 U/L (ref 10–35)
Albumin: 3.8 g/dL (ref 3.6–5.1)
BUN: 12 mg/dL (ref 7–25)
CALCIUM: 8.8 mg/dL (ref 8.6–10.3)
CHLORIDE: 100 mmol/L (ref 98–110)
CO2: 25 mmol/L (ref 20–31)
Creat: 0.71 mg/dL (ref 0.70–1.33)
GLUCOSE: 309 mg/dL — AB (ref 65–99)
Potassium: 4.6 mmol/L (ref 3.5–5.3)
Sodium: 134 mmol/L — ABNORMAL LOW (ref 135–146)
Total Bilirubin: 0.6 mg/dL (ref 0.2–1.2)
Total Protein: 6.8 g/dL (ref 6.1–8.1)

## 2016-06-02 LAB — LIPID PANEL
CHOL/HDL RATIO: 5.2 ratio — AB (ref <=5.0)
CHOLESTEROL: 263 mg/dL — AB (ref 125–200)
HDL: 51 mg/dL (ref >=40)
Triglycerides: 487 mg/dL — ABNORMAL HIGH (ref <150)

## 2016-06-02 LAB — GLUCOSE, POCT (MANUAL RESULT ENTRY): POC GLUCOSE: 277 mg/dL — AB (ref 70–99)

## 2016-06-02 LAB — POCT GLYCOSYLATED HEMOGLOBIN (HGB A1C): HEMOGLOBIN A1C: 10.9

## 2016-06-02 MED ORDER — ASPIRIN EC 81 MG PO TBEC: 81.0000 mg | DELAYED_RELEASE_TABLET | Freq: Every day | ORAL | 3 refills | Status: DC

## 2016-06-02 MED ORDER — SITAGLIPTIN PHOS-METFORMIN HCL 50-1000 MG PO TABS: 1.0000 | ORAL_TABLET | Freq: Two times a day (BID) | ORAL | 1 refills | Status: DC

## 2016-06-02 MED ORDER — GLIPIZIDE 5 MG PO TABS: 5.0000 mg | ORAL_TABLET | Freq: Every day | ORAL | 1 refills | Status: DC

## 2016-06-02 NOTE — Patient Instructions (Addendum)
Your diabetes has gotten much worse.  We will restart medications but please recheck in 3 months BEFORE you run out of medi   IF you received an x-ray today, you will receive an invoice from [facility]. Please contact [facility] at [phone] with questions or concerns regarding your invoice.   IF you received labwork today, you will receive an invoice from [employer]. Please contact [facility] at [phone] with questions or concerns regarding your invoice.   Our billing staff will not be able to assist you with questions regarding bills from these companies.  You will be contacted with the lab results as soon as they are available. The fastest way to get your results is to activate your My Chart account. Instructions are located on the last page of this paperwork. If you have not heard from [place] regarding the results in 2 weeks, please contact this office.     Control del nivel de glucosa en la sangre - Adultos (Blood Glucose Monitoring, Adult) El control de la glucosa en la sangre (tambin llamada azcar en la sangre) lo ayudar a tener la diabetes bajo control. Tambin ayuda a que usted y Lexicographer la diabetes y determinen si el tratamiento es Engineer, manufacturing. POR QU HAY QUE CONTROLAR LA GLUCOSA EN LA SANGRE?  Esto puede ayudar a comprender de [employer], la actividad fsica y los medicamentos inciden en los niveles de [place].  Le permite conocer el nivel de glucosa en la sangre en cualquier momento dado. Puede saber rpidamente si el nivel es bajo (hipoglucemia) o alto (hiperglucemia).  Puede ser de ayuda para que usted y el mdico sepan cmo Presenter, broadcasting,  y para entender cmo controlar una enfermedad o ajustar los medicamentos para hacer ejercicio. CUNDO DEBE HACERSE LAS PRUEBAS? El mdico lo ayudar a decidir con qu frecuencia deber [employer] niveles de glucosa en la [place]. Esto puede depender  del tipo de diabetes que tenga, su control de la diabetes o los tipos de medicamentos que tome. Asegrese de anotar todos los valores de la glucosa en la [place], de modo que esta informacin pueda ser revisada por su mdico. A continuacin puede ver ejemplos de los momentos para Education officer, environmental la prueba que el mdico puede Neurosurgeon. Diabetes tipo1  Mdaselo al menos 2 veces al da si la diabetes est bien controlada, si [place] una bomba de insulina o si se aplica muchas inyecciones diarias.  Si la diabetes no est bien controlada o si est enfermo, puede ser necesario que se controle con ms frecuencia.  Es recomendable que tambin lo mida en estas oportunidades:  Antes de cada inyeccin de insulina.  Antes y despus de hacer ejercicio.  Entre las comidas y 2horas despus de Arts administrator.  Ocasionalmente, entre las 2:00a.m. y las 3:00a.m. Diabetes tipo2  Si est utilizando insulina, realice la medicin al menos 2 veces al [employer]. Sin embargo, es Insurance claims handler medicin antes de cada inyeccin de [place].  Si toma medicamentos por boca (va oral), hgase la prueba 2veces por da.  Si sigue una dieta controlada, hgase la prueba una vez por da.  Si la diabetes no est bien controlada o si est enfermo, puede ser necesario que se controle con ms frecuencia. CMO CONTROLAR EL NIVEL DE GLUCOSA EN LA SANGRE Insumos necesarios  Medidor de glucosa en la sangre.  Tiras reactivas para el medidor. Cada medidor tiene sus propias tiras reactivas. Debe usar las tiras reactivas correspondientes a su medidor.  Neomia Dear  aguja para pinchar (lanceta).  Un dispositivo que sujeta la lanceta (dispositivo de puncin).  Un diario o libro de anotaciones para [employer]. Procedimiento  Lave sus manos con agua y [place]. No se recomienda usar alcohol.  Pnchese el costado del dedo (no la punta) con Optometrist.  Apriete suavemente el dedo hasta que aparezca una pequea gota de [place].  Siga las  instrucciones que vienen con el medidor para Public affairs consultant tira Firefighter, Contractor la sangre sobre la tira y usar el medidor de Horticulturist, commercial. Otras zonas de las que se puede tomar sangre para la prueba Algunos medidores le permiten tomar sangre para la prueba de otras zonas del cuerpo (que no son el dedo). Estas reas se llaman sitios alternativos. Los sitios alternativos ms comunes son los siguientes:  El Product manager.  El muslo.  La zona posterior de la parte inferior de la pierna.  La palma de la mano. El flujo de sangre en estas zonas es ms lento. Por lo tanto, los valores de glucosa en la sangre que obtenga pueden estar demorados, y los nmeros son diferentes de los que obtiene de los dedos. No saque sangre de sitios alternativos si cree que tiene hipoglucemia. Los valores no sern precisos. Siempre extraiga del dedo si tiene hipoglucemia. Adems, si no puede darse cuenta cuando tiene bajos los niveles (hipoglucemia asintomtica), siempre extraiga sangre de los dedos para los controles de glucosa en la [place]. CONSEJOS ADICIONALES PARA EL CONTROL DE LA GLUCOSA  No vuelva a utilizar las lancetas.  Siempre tenga los insumos a mano.  Todos los medidores de glucosa incluyen un nmero de telfono "directo", disponible las 24 horas, al que podr llamar si tiene preguntas o [place].  Ajuste (calibre) el medidor de glucosa con una solucin de control despus de terminar algunas cajas de tiras reactivas. LLEVE REGISTROS DE LOS NIVELES DE GLUCOSA EN LA SANGRE Es recomendable llevar un diario o un registro de los valores de glucosa en la [place]. La [employer] de los medidores de glucosa, sino todos, conservan el registro de la glucosa en el dispositivo. Algunos medidores permiten descargar los registros a su computadora. Llevar un registro de los valores de glucosa en la sangre es especialmente til si desea observar los patrones. Haga anotaciones simultneas con la Microbiologist de los valores de  glucosa en la sangre debido a que podra olvidar lo que ocurri en el momento exacto. Llevar un buen registro los ayudar a usted y al mdico a Printmaker juntos para Personnel officer un buen control de la diabetes.    Esta informacin no tiene Theme park manager el consejo del mdico. Asegrese de hacerle al mdico cualquier pregunta que tenga.   Document Released: 10/19/2005 Document Revised: 11/09/2014 Elsevier Interactive Patient Education 2016 [employer]. Cmo evitar los problemas relacionados con la diabetes (How to Avoid Diabetes Problems) Usted puede hacer varias cosas para prevenir o disminuir los problemas relacionados con la diabetes. Seguir un plan para la diabetes y cuidarse usted mismo puede reducir el riesgo de complicaciones graves o potencialmente mortales. A continuacin, encontrar algunas cosas que puede hacer para prevenir los problemas de la diabetes. CONTROLE LA DIABETES Siga las instrucciones de su mdico, enfermera educadora en diabetes y nutricionista para Scientist, physiological enfermedad. Le ensearn los fundamentos para el cuidado de la diabetes. Le ayudar con las preguntas que pueda tener. Aprenda acerca de la diabetes y a tomar decisiones saludables en materia de alimentacin y [place] fsica. Controle su nivel de glucosa en la  sangre con regularidad. El Conservation officer, historic buildings a decidir con qu frecuencia debe revisar su nivel de glucosa en la sangre, en funcin de los objetivos de su tratamiento y el xito en cumplirlos.  NO USE NICOTINA La nicotina y la diabetes son Neomia Dear combinacin peligrosa. La nicotina aumenta el riesgo de problemas con la diabetes. Si deja de Leisure centre manager, reducir el riesgo de infarto de miocardio, ictus, enfermedades del sistema nervioso y enfermedades renales. Pueden mejorar el colesterol y sus niveles de presin arterial. La circulacin sangunea mejorar tambin. No consuma ningn producto que contenga tabaco, incluidos cigarrillos, tabaco de Theatre manager o cigarrillos  electrnicos. Si necesita ayuda para dejar de fumar, hable con el mdico. MANTENGA SU PRESIN ARTERIAL BAJO CONTROL El mdico determinar cul debera ser su presin arterial en funcin de su edad, los medicamentos que [place], el tiempo que hace que tiene diabetes y cualquier otra enfermedad que padezca. La presin arterial consiste en dos nmeros. En general, el objetivo es mantener el nmero de [place] (presin sistlica) en un valor mximo de 130 y el nmero de abajo (presin diastlica) en un valor mximo de 80. Si corresponde, el mdico recomendar un objetivo de presin arterial con valores ms bajos. La planificacin de comidas, los medicamentos y el ejercicio pueden ayudarle a Barista sus objetivos. Asegrese de que el mdico le mida la presin arterial en cada visita. MANTENGA LOS NIVELES DE COLESTEROL BAJO CONTROL Los niveles normales de colesterol ayudan a prevenir enfermedades del corazn y el ictus. Estos son los [employer] de salud para las personas con diabetes. Mantener los niveles de colesterol bajo control tambin puede ayudar con el flujo sanguneo. Controle su nivel de colesterol por lo menos una vez al ao. Su mdico puede recetarle un medicamento llamado estatina. Las estatinas reducen Print production planner. Si no est tomando una estatina, pregntele a su mdico si debera tomarla. La planificacin de comidas, el ejercicio y los medicamentos pueden ayudar a Barista sus objetivos relacionados con el nivel de colesterol.  PLANIFIQUE Y CUMPLA CON SUS EXAMENES FISICOS Y OCULARES ANUALES El mdico le dir la frecuencia con la que quiere controlarlo en funcin de su plan de tratamiento. Es importante que cumpla con estos controles para identificar rpidamente posibles problemas y puedan evitarse o tratarse las complicaciones.  En cada visita, su mdico debe pesarlo, medirle la presin arterial y Development worker, community su control del nivel de glucosa.  La hemoglobina A1c debe controlarse:  Por lo [employer] al ao si usted est en el nivel adecuado.  Cada 3 meses si hay cambios en el tratamiento.  Si usted no est alcanzando sus objetivos.  Los lpidos de la sangre deben controlarse anualmente. Tambin hay que controlar anualmente la presencia de protenas en la orina (microalbuminuria).  Si tiene diabetes tipo1, programe un examen de fondo de ojos en el perodo de 5aos a partir del diagnstico y despus una vez al ao. Si tiene diabetes tipo2, programe un examen de fondo de ojos cuando reciba el diagnstico y despus una vez al ao. Los exmenes posteriores deben hacerse cada 2 o 3 aos si uno o ms exmenes han sido normales. MANTNGASE AL DA CON LAS VACUNAS Se recomienda que se vacune contra la gripe todos los [place]. Adems, que se vacune contra la neumona (vacuna antineumoccica). Si es mayor de 73 aos y nunca se Animator la neumona, esta vacuna puede administrarse como una serie de dos vacunas por separado. Pregntele al mdico qu otras vacunas se pueden recomendar. CUIDE SUS  PIES  La diabetes puede hacer que el flujo sanguneo (circulacin) en las piernas y los pies sea deficiente. Debido a esto, la piel se torna ms delgada, se rompe con facilidad y se cura ms lentamente. Tambin puede sufrir un dao en los nervios de las piernas y los pies, lo que disminuye la sensibilidad. Es posible que no advierta las heridas ms pequeas que pueden conducir a infecciones graves. El cuidado de los pies es muy importante. Se har una inspeccin visual en cada visita mdica de rutina. Estos controles observarn si hay cortes, lesiones u otros problemas en los pies. Una vez por ao debe hacerse un examen ms intensivo. Este examen incluye la inspeccin visual y Neomia Dear evaluacin de los pulsos del pie y la sensibilidad. Usted tambin debe hacer lo siguiente:  Examine sus pies todos los [place] para detectar cortes, ampollas, callos, uas encarnadas, y signos de infeccin, tales como  enrojecimiento, hinchazn o pus.  Lave y seque bien los pies, [employer] dedos.  Evite sumergir sus pies regularmente en agua caliente.  Hidrate la piel seca con locin, evitando colocarla [employer] dedos.  Crtese las uas en lnea recta y lime los bordes.  Evite los zapatos que no calzan bien o tienen reas que irritan la piel.  Evite ir descalzo o con calcetines solamente. Sus pies necesitan proteccin. CUIDE SUS DIENTES Las personas con diabetes mal controlada son ms propensas a Immunologist (periodontales). Estas infecciones hacen que la diabetes sea difcil de Chief Operating Officer. Las [employer], si se dejan sin tratamiento, pueden conducir a la prdida de dientes. [place] sus [employer] al da, use hilo dental y visite al dentista para los controles y limpieza cada 6 meses o 2 veces al ao. CONSULTE A SU MDICO SOBRE EL CONSUMO DE ASPIRINA Tomar aspirina a diario se recomienda para ayudar a prevenir la enfermedad cardiovascular en personas con y sin diabetes. Pregntele a su mdico si esto lo beneficiara y cul es la dosis [employer] recomienda. BEBA DE MANERA RESPONSABLE Las cantidades moderadas de alcohol (menos de 1 bebida al da para mujeres adultas y menores de 2 bebidas al da para hombres adultos) tienen un mnimo efecto sobre la glucosa en la sangre si se ingiere con los alimentos. Es importante comer alimentos cuando se bebe alcohol para evitar la hipoglucemia. Las Building services engineer el alcohol si tienen un historial de consumo excesivo o dependencia, si es una mujer Industry, y si tiene una enfermedad heptica, pancreatitis, neuropata avanzada, o hipertrigliceridemia grave. DISMINUYA EL NIVEL DE ESTRS Vivir con diabetes puede ser estresante. Cuando usted est bajo estrs, el nivel de glucosa en la sangre puede verse afectada de dos maneras:  Las hormonas del estrs pueden hacer que la glucosa en la sangre se  eleve.  Probablemente no se cuid lo suficiente. Es Neomia Dear buena idea estar al tanto del nivel de estrs y Radio producer los cambios que sean necesarios para ayudar a Company secretary mejor las situaciones difciles. Los grupos de apoyo, Manufacturing systems engineer planificada, un pasatiempo que le guste, la [place], las relaciones saludables y Radio producer ejercicio son factores que ayudan a reducir el nivel de Librarian, academic. Si sus esfuerzos no parecen [employer], pdale ayuda a su mdico o a Administrator, sports de la salud mental capacitado.   Esta informacin no tiene Theme park manager el consejo del mdico. Asegrese de hacerle al mdico cualquier pregunta que tenga.   Document Released: 10/08/2011 Document Revised: 11/09/2014 Elsevier Interactive Patient Education 2016 Elsevier  Inc.  

## 2016-06-03 LAB — MICROALBUMIN / CREATININE URINE RATIO
CREATININE, URINE: 67 mg/dL (ref 20–370)
MICROALB/CREAT RATIO: 109 ug/mg{creat} — AB (ref <30)
Microalb, Ur: 7.3 mg/dL

## 2016-06-08 NOTE — Progress Notes (Addendum)
By signing my name below, I, [name], attest that this documentation has been prepared under the direction and in the presence of [name], MD.  Electronically Signed: Arvilla MarketMesha Guinyard, Medical Scribe. 06/08/16. 12:09 PM.  Subjective:    Patient ID: [name], male    DOB: 1966-09-02, 50 y.o.   MRN: [id number]  HPI Chief Complaint  Patient presents with  . Follow-up    Diabetes    HPI Comments: [name] is a 50 y.o. male who presents to the [facility] for DM follow-up. Pt doesn't speak English so an interpreter was used to translate (interpreter [phone]). 6 months prior pt was seen by Dr. Milus GlazierLauenstein and his A1c was 8.8. He was placed on Janumet 50-100 mg BID, question to addition to Metformin 500 mg BID. Pt had a glass of milk around 7am this morning.  Pt takes a medication for cholesterol, and for DM. Pt states he just ran out of his DM medication yesterday. Pt takes 2 pills in the morning and two pills at night. Pt mentions his feet feel wet even when it's dry, and feels like there is will be foot edema in bilateral feet. Pt puts an OTC on his feet. Pt doesn't check his blood sugar at home, although he has a glucometer at home, and hasn't seen a ophthalmologist in the past year. Pt denies experiencing hypoglycemic symptoms- sweaty, tremors, or syncopal. Pt denies experiencing side affects  while on his DM medication- no N/V/D. Pt denies having any negative side affects from his cholesterol medications- chest pain, and abdominal pain.   Patient Active Problem List   Diagnosis Date Noted  . Diabetes type 2, uncontrolled (HCC) 06/13/2015   Past Medical History:  Diagnosis Date  . Diabetes mellitus without complication (HCC)    No past surgical history on file. No Known Allergies Prior to Admission medications   Medication Sig Start Date End Date Taking? Authorizing Provider  fenofibrate 54 MG tablet Take 1 tablet (54 mg total) by mouth  daily. Patient not taking: Reported on 06/02/2016 12/12/15   Elvina SidleKurt Lauenstein, MD  metFORMIN (GLUCOPHAGE) 500 MG tablet Take 1 tablet (500 mg total) by mouth 2 (two) times daily with a meal. Patient not taking: Reported on 06/02/2016 12/12/15   Elvina SidleKurt Lauenstein, MD  naproxen (NAPROSYN) 500 MG tablet Take 1 tablet (500 mg total) by mouth 2 (two) times daily with a meal. Patient not taking: Reported on 06/02/2016 12/31/14   Ofilia NeasMichael L Clark, PA-C  sildenafil (VIAGRA) 100 MG tablet Take 0.5 tablets (50 mg total) by mouth daily as needed for erectile dysfunction. Patient not taking: Reported on 06/02/2016 12/12/15   Elvina SidleKurt Lauenstein, MD  sitaGLIPtin-metformin (JANUMET) 50-1000 MG tablet Take 1 tablet by mouth 2 (two) times daily with a meal. Patient not taking: Reported on 06/02/2016 12/12/15   Elvina SidleKurt Lauenstein, MD   Social History   Social History  . Marital status: Married    Spouse name: N/A  . Number of children: N/A  . Years of education: N/A   Occupational History  . Not on file.   Social History Main Topics  . Smoking status: Never Smoker  . Smokeless tobacco: Not on file  . Alcohol use Yes     Comment: socially  . Drug use: No  . Sexual activity: Not on file   Other Topics Concern  . Not on file   Social History Narrative  . No narrative on file   Depression screen  PHQ 2/9 06/02/2016  Decreased Interest 0  Down, Depressed, Hopeless 0  PHQ - 2 Score 0   Review of Systems  Constitutional: Negative for diaphoresis and fever.  Respiratory: Negative for shortness of breath.   Cardiovascular: Negative for chest pain and palpitations.  Gastrointestinal: Negative for abdominal pain, diarrhea, nausea and vomiting.  Musculoskeletal: Negative for arthralgias and myalgias.  Skin: Negative for rash.  Neurological: Negative for tremors, syncope and numbness.    Objective:  Physical Exam  Constitutional: He appears well-developed and well-nourished. No distress.  HENT:  Head: Normocephalic and  atraumatic.  Eyes: Conjunctivae are normal.  Neck: Neck supple.  Cardiovascular: Normal rate, regular rhythm and normal heart sounds.  Exam reveals no gallop and no friction rub.   No murmur heard. Pulses:      Dorsalis pedis pulses are 2+ on the right side, and 2+ on the left side.       Posterior tibial pulses are 2+ on the right side, and 2+ on the left side.  Pulmonary/Chest: Effort normal and breath sounds normal. No respiratory distress. He has no wheezes. He has no rales.  Neurological: He is alert.  Skin: Skin is warm and dry.  Psychiatric: He has a normal mood and affect. His behavior is normal.  Nursing note and vitals reviewed.  BP 116/80 (BP Location: Right Arm, Patient Position: Sitting, Cuff Size: Normal)   Pulse 86   Temp 98.1 F (36.7 C) (Oral)   Resp 18   Ht  (1.626 m)   Wt 154 lb (69.9 kg)   SpO2 97%   BMI 26.43 kg/m   Diabetic Foot Exam - Simple   Simple Foot Form Diabetic Foot exam was performed with the following findings:  Yes 06/02/2016 12:24 PM  Visual Inspection No deformities, no ulcerations, no other skin breakdown bilaterally:  Yes Sensation Testing Intact to touch and monofilament testing bilaterally:  Yes Pulse Check Posterior Tibialis and Dorsalis pulse intact bilaterally:  Yes Comments     Results for orders placed or performed in visit on 06/02/16  Comprehensive metabolic panel  Result Value Ref Range   Sodium 134 (L) 135 - 146 mmol/L   Potassium 4.6 3.5 - 5.3 mmol/L   Chloride 100 98 - 110 mmol/L   CO2 25 20 - 31 mmol/L   Glucose, Bld 309 (H) 65 - 99 mg/dL   BUN 12 7 - 25 mg/dL   Creat 7.82 9.56 - 2.13 mg/dL   Total Bilirubin 0.6 0.2 - 1.2 mg/dL   Alkaline Phosphatase 54 40 - 115 U/L   AST 13 10 - 35 U/L   ALT 17 9 - 46 U/L   Total Protein 6.8 6.1 - 8.1 g/dL   Albumin 3.8 3.6 - 5.1 g/dL   Calcium 8.8 8.6 - 08.6 mg/dL  Lipid panel  Result Value Ref Range   Cholesterol 263 (H) 125 - 200 mg/dL   Triglycerides 578 (H) <150  mg/dL   HDL 51 >=46 mg/dL   Total CHOL/HDL Ratio 5.2 (H) <=5.0 Ratio   VLDL NOT CALC <30 mg/dL   LDL Cholesterol NOT CALC <130 mg/dL  Microalbumin/Creatinine Ratio, Urine  Result Value Ref Range   Creatinine, Urine 67 20 - 370 mg/dL   Microalb, Ur 7.3 Not estab mg/dL   Microalb Creat Ratio 109 (H) <30 mcg/mg creat  POCT glycosylated hemoglobin (Hb A1C)  Result Value Ref Range   Hemoglobin A1C 10.9   POCT glucose (manual entry)  Result Value Ref Range  POC Glucose 277 (A) 70 - 99 mg/dl   Assessment & Plan:   1. Uncontrolled type 2 diabetes mellitus with hyperglycemia, without long-term current use of insulin (HCC)   2. Uncontrolled type 2 diabetes mellitus without complication, without long-term current use of insulin (HCC)   Unsure what pt is actually taking now as reports he is taking the max dose janumet in addition to metformin both bid which would mean he is on 1500mg  bid.  Stop the metformin 500 bid, cont the janumet 50-1000 bid, start glipizide since a1c sig worsened.  Start asa 81mg . Refer for diabetic eye exam.  +microalbuminuria - start lisinopril 2.5.  Recheck bmp at next ov  HPL: LDL incalculable but non-HDL 212 (when goal is 130) so start atrovastatin 40. Check lfts at next OV.  Recheck on 11/1 for repeat a1c and cmp.  Orders Placed This Encounter  Procedures  . Comprehensive metabolic panel    Order Specific Question:   Has the patient fasted?    Answer:   Yes  . Lipid panel    Order Specific Question:   Has the patient fasted?    Answer:   Yes  . Microalbumin/Creatinine Ratio, Urine  . Ambulatory referral to Ophthalmology    Referral Priority:   Routine    Referral Type:   Consultation    Referral Reason:   Specialty Services Required    Requested Specialty:   Ophthalmology    Number of Visits Requested:   1  . POCT glycosylated hemoglobin (Hb A1C)  . POCT glucose (manual entry)  . HM DIABETES FOOT EXAM    Meds ordered this encounter  Medications  .  aspirin EC 81 MG tablet    Sig: Take 1 tablet (81 mg total) by mouth daily.    Dispense:  90 tablet    Refill:  3  . DISCONTD: sitaGLIPtin-metformin (JANUMET) 50-1000 MG tablet    Sig: Take 1 tablet by mouth 2 (two) times daily with a meal.    Dispense:  180 tablet    Refill:  1  . glipiZIDE (GLUCOTROL) 5 MG tablet    Sig: Take 1 tablet (5 mg total) by mouth daily before breakfast.    Dispense:  90 tablet    Refill:  1  . sitaGLIPtin-metformin (JANUMET) 50-1000 MG tablet    Sig: Take 1 tablet by mouth 2 (two) times daily with a meal.    Dispense:  180 tablet    Refill:  1   Language level caveat - interpretor used.  I personally performed the services described in this documentation, which was scribed in my presence. The recorded information has been reviewed and considered, and addended by me as needed.   Norberto Sorenson, M.D.  [facility] [address] [place], [place] [address] [phone] phone [phone] fax  06/08/16 1:21 AM

## 2016-06-14 MED ORDER — LISINOPRIL 2.5 MG PO TABS: 2.5000 mg | ORAL_TABLET | Freq: Every day | ORAL | 0 refills | Status: DC

## 2016-06-14 MED ORDER — ATORVASTATIN CALCIUM 40 MG PO TABS: 40.0000 mg | ORAL_TABLET | Freq: Every day | ORAL | 0 refills | Status: DC

## 2016-06-14 NOTE — Addendum Note (Signed)
Addended by: Norberto SorensonSHAW, Odena Mcquaid on: 06/14/2016 12:10 PM   Modules accepted: Orders

## 2016-07-27 ENCOUNTER — Ambulatory Visit (INDEPENDENT_AMBULATORY_CARE_PROVIDER_SITE_OTHER): Payer: 59 | Admitting: Family Medicine

## 2016-07-27 VITALS — BP 116/76 | HR 87 | Temp 98.3°F | Resp 17 | Ht 64.0 in | Wt 152.0 lb

## 2016-07-27 DIAGNOSIS — E11628 Type 2 diabetes mellitus with other skin complications: Secondary | ICD-10-CM

## 2016-07-27 DIAGNOSIS — Z23 Encounter for immunization: Secondary | ICD-10-CM | POA: Diagnosis not present

## 2016-07-27 DIAGNOSIS — L84 Corns and callosities: Secondary | ICD-10-CM | POA: Diagnosis not present

## 2016-07-27 DIAGNOSIS — S91331A Puncture wound without foreign body, right foot, initial encounter: Secondary | ICD-10-CM

## 2016-07-27 NOTE — Progress Notes (Signed)
Patient ID: [name], male    DOB: Jul 17, 1966, 50 y.o.   MRN: [id number]  PCP: No PCP Per Patient  Chief Complaint  Patient presents with  . Foot Injury    Stepped on nail Saturday.     Subjective:   HPI interpreter number [id number] 50 year old male presents for evaluation his right foot after stepping on a rusty nail two days ago. He was able to immediately pull the nail from his foot.  He reports small amount of bleeding occurred. He cleaned the site of injury and wanted to come in to have it checked out. He reports mild pain still present when he stands on his foot. Patient is uncertain as to whether or not he's had a tetanus vaccination.  Social History   Social History  . Marital status: Married    Spouse name: N/A  . Number of children: N/A  . Years of education: N/A   Occupational History  . Not on file.   Social History Main Topics  . Smoking status: Never Smoker  . Smokeless tobacco: Not on file  . Alcohol use Yes     Comment: socially  . Drug use: No  . Sexual activity: Not on file   Other Topics Concern  . Not on file   Social History Narrative  . No narrative on file    Family History  Problem Relation Age of Onset  . Diabetes Mother   . Diabetes Father    Review of Systems SEE HPI  Patient Active Problem List   Diagnosis Date Noted  . Diabetes type 2, uncontrolled (HCC) 06/13/2015     Prior to Admission medications   Medication Sig Start Date End Date Taking? Authorizing Provider  aspirin EC 81 MG tablet Take 1 tablet (81 mg total) by mouth daily. 06/02/16  Yes Sherren MochaEva N Shaw, MD  atorvastatin (LIPITOR) 40 MG tablet Take 1 tablet (40 mg total) by mouth daily at 6 PM. 06/14/16  Yes Sherren MochaEva N Shaw, MD  glipiZIDE (GLUCOTROL) 5 MG tablet Take 1 tablet (5 mg total) by mouth daily before breakfast. 06/02/16  Yes Sherren MochaEva N Shaw, MD  lisinopril (PRINIVIL,ZESTRIL) 2.5 MG tablet Take 1 tablet (2.5 mg total) by mouth daily. 06/14/16  Yes Sherren MochaEva N Shaw, MD    sitaGLIPtin-metformin (JANUMET) 50-1000 MG tablet Take 1 tablet by mouth 2 (two) times daily with a meal. 06/02/16  Yes Sherren MochaEva N Shaw, MD     No Known Allergies     Objective:  Physical Exam  Constitutional: He is oriented to person, place, and time. He appears well-developed and well-nourished.  HENT:  Head: Normocephalic and atraumatic.  Right Ear: External ear normal.  Left Ear: External ear normal.  Mouth/Throat: Oropharynx is clear and moist.  Eyes: Conjunctivae and EOM are normal. Pupils are equal, round, and reactive to light.  Neck: Normal range of motion.  Cardiovascular: Normal rate, regular rhythm, normal heart sounds and intact distal pulses.   Pulmonary/Chest: Effort normal.  Musculoskeletal: Normal range of motion.  Neurological: He is alert and oriented to person, place, and time.  Skin: Skin is warm and dry.  Right foot has approximately 3 mm closed puncture wound. Non erythematous. Mildly tender to touch. Negative for edema.  Psychiatric: He has a normal mood and affect. His behavior is normal. Judgment and thought content normal.    Diabetic Foot Exam - Simple   Simple Foot Form Visual Inspection No deformities, no ulcerations, no other skin breakdown bilaterally:  Yes Sensation Testing Intact to touch and monofilament testing bilaterally:  Yes Pulse Check Comments Posterior dorsalis pulse intact bilaterally. Unable to palpate posterior tibialis  Vitals:   07/27/16 1550  BP: 116/76  Pulse: 87  Resp: 17  Temp: 98.3 F (36.8 C)   Assessment & Plan:  1. Puncture wound of foot excluding toes without complication, right, initial encounter On exam the right foot is negative for signs of infection. The patient has type 2 diabetes therefore, advising him to return to the office for follow-up 07/31/16 in order to have foot inspected.   2. Type 2 diabetes mellitus with pressure callus (HCC), multiple regions of both feet bilaterally - Ambulatory referral to  Podiatry   Godfrey Pick. Tiburcio Pea, MSN, FNP-C [facility]

## 2016-07-27 NOTE — Patient Instructions (Addendum)
Return for foot evaluation on 07/31/16.  Referral to podiatry to evaluate foot ulcers ordered.  They will call you to schedule appointment.   Vuelta para la evaluacin del pie el 29/09/17. Referencia a la podologa para evaluar lceras peditricas ordenadas. Ellos le llamarn para programar una cita.  IF you received an x-ray today, you will receive an invoice from [facility]. Please contact [facility] at [phone] with questions or concerns regarding your invoice.   IF you received labwork today, you will receive an invoice from [facility]. Please contact [facility] at [phone] with questions or concerns regarding your invoice.   Our billing staff will not be able to assist you with questions regarding bills from these companies.  You will be contacted with the lab results as soon as they are available. The fastest way to get your results is to activate your My Chart account. Instructions are located on the last page of this paperwork. If you have not heard from us regarding the results in 2 weeks, please contact this office.

## 2016-07-31 ENCOUNTER — Ambulatory Visit (INDEPENDENT_AMBULATORY_CARE_PROVIDER_SITE_OTHER): Payer: 59 | Admitting: Family Medicine

## 2016-07-31 VITALS — BP 116/72 | HR 77 | Temp 98.5°F | Resp 16 | Ht 62.5 in | Wt 150.0 lb

## 2016-07-31 DIAGNOSIS — M79671 Pain in right foot: Secondary | ICD-10-CM

## 2016-07-31 MED ORDER — NAPROXEN 500 MG PO TABS: 500.0000 mg | ORAL_TABLET | Freq: Two times a day (BID) | ORAL | 0 refills | Status: DC

## 2016-07-31 NOTE — Progress Notes (Signed)
Patient ID: [name], male    DOB: [birth year], 50 y.o.   MRN: [id number]  PCP: No PCP Per Patient  Chief Complaint  Patient presents with  . Follow-up    recheck Rt Foot    Subjective:   HPI 50 year old male presents for evaluation of right foot pain following stepping on a nail times 6 days ago.  He reports his foot remains tender with walking. Pain hasn't worsened. Denies any drainage or swelling. Remains able to perform daily work responsibilities. Request something mild for pain due to he experiences pain after standing on foot for prolonged period of time.  Social History   Social History  . Marital status: Married    Spouse name: N/A  . Number of children: N/A  . Years of education: N/A   Occupational History  . Not on file.   Social History Main Topics  . Smoking status: Never Smoker  . Smokeless tobacco: Not on file  . Alcohol use Yes     Comment: socially  . Drug use: No  . Sexual activity: Not on file   Other Topics Concern  . Not on file   Social History Narrative  . No narrative on file   Family History  Problem Relation Age of Onset  . Diabetes Mother   . Diabetes Father    Review of Systems See HPI   Patient Active Problem List   Diagnosis Date Noted  . Diabetes type 2, uncontrolled (HCC) 06/13/2015     Prior to Admission medications   Medication Sig Start Date End Date Taking? Authorizing Provider  aspirin EC 81 MG tablet Take 1 tablet (81 mg total) by mouth daily. 06/02/16  Yes Sherren MochaEva N Shaw, MD  atorvastatin (LIPITOR) 40 MG tablet Take 1 tablet (40 mg total) by mouth daily at 6 PM. 06/14/16  Yes Sherren MochaEva N Shaw, MD  glipiZIDE (GLUCOTROL) 5 MG tablet Take 1 tablet (5 mg total) by mouth daily before breakfast. 06/02/16  Yes Sherren MochaEva N Shaw, MD  lisinopril (PRINIVIL,ZESTRIL) 2.5 MG tablet Take 1 tablet (2.5 mg total) by mouth daily. 06/14/16  Yes Sherren MochaEva N Shaw, MD  sitaGLIPtin-metformin (JANUMET) 50-1000 MG tablet Take 1 tablet by mouth 2 (two)  times daily with a meal. 06/02/16  Yes Sherren MochaEva N Shaw, MD   No Known Allergies     Objective:  Physical Exam  Constitutional: He is oriented to person, place, and time. He appears well-developed and well-nourished.  HENT:  Head: Normocephalic and atraumatic.  Right Ear: External ear normal.  Left Ear: External ear normal.  Nose: Nose normal.  Eyes: Conjunctivae and EOM are normal. Pupils are equal, round, and reactive to light.  Neck: Normal range of motion.  Cardiovascular: Normal rate, regular rhythm, normal heart sounds and intact distal pulses.   Pulmonary/Chest: Effort normal and breath sounds normal.  Musculoskeletal: Normal range of motion.  Neurological: He is alert and oriented to person, place, and time.  Skin: Skin is warm and dry.  Psychiatric: He has a normal mood and affect. His behavior is normal. Judgment and thought content normal.    Vitals:   07/31/16 1223  BP: 116/72  Pulse: 77  Resp: 16  Temp: 98.5 F (36.9 C)   Assessment & Plan:  1. Right foot pain, patient presents to for follow-up of his right foot after he stepped on a nail and suffered a puncture to foot.  His foot is negative of redness, swelling, and the puncture site is  completely closed. Negative for tenderness with palpation, although the patient reports tenderness of right foot with prolonged standing.  . naproxen (NAPROSYN) 500 MG tablet    Sig: Take 1 tablet (500 mg total) by mouth 2 (two) times daily with a meal.    Dispense:  30 tablet   Godfrey Pick. Tiburcio Pea, MSN, FNP-C [facility]

## 2016-07-31 NOTE — Patient Instructions (Addendum)
Take naproxen 500 mg twice daily as needed for foot pain.  Return for follow-up 1-2 month for diabetes recheck.  Tome naproxen 500 mg dos veces al da segn sea necesario para Insurance claims handlerel dolor en el pie. Regreso para el seguimiento 1-2 meses para la revisin de la diabetes   IF you received an x-ray today, you will receive an invoice from [facility]. Please contact [facility] at [phone] with questions or concerns regarding your invoice.   IF you received labwork today, you will receive an invoice from [facility]. Please contact [facility] at [phone] with questions or concerns regarding your invoice.   Our billing staff will not be able to assist you with questions regarding bills from these companies.  You will be contacted with the lab results as soon as they are available. The fastest way to get your results is to activate your My Chart account. Instructions are located on the last page of this paperwork. If you have not heard from us regarding the results in 2 weeks, please contact this office.

## 2016-08-25 ENCOUNTER — Telehealth: Payer: Self-pay

## 2016-08-25 MED ORDER — LINAGLIPTIN-METFORMIN HCL 2.5-1000 MG PO TABS: 1.0000 | ORAL_TABLET | Freq: Two times a day (BID) | ORAL | 1 refills | Status: DC

## 2016-08-25 NOTE — Telephone Encounter (Signed)
Dr Clelia CroftShaw, message from pharm that janumet needs PA. When I attempted it, the form advised that pt needs to have tried/failed:  Jentadueto (linagliptin/metformin immediate-release) AND one of the following: Kazano (alogliptin/metformin immediate-release), Kombiglyze XR. Do you want to Rx one of these?

## 2016-08-25 NOTE — Telephone Encounter (Signed)
Pt has been on Janumet since 12/2015 - 8 mos.  I assume his ins or formulary must have changed but I don't see that there would be any problems trying him on these other meds. Please let pt know. If he can't tolerate it, let us know.

## 2016-09-03 NOTE — Telephone Encounter (Signed)
LMOM for pt [facility] Asked for CB w/any further ?s and if he has any SEs or it is not effective.

## 2017-01-10 ENCOUNTER — Inpatient Hospital Stay (HOSPITAL_COMMUNITY): Payer: Medicaid Other

## 2017-01-10 ENCOUNTER — Emergency Department (HOSPITAL_COMMUNITY): Payer: Medicaid Other

## 2017-01-10 ENCOUNTER — Encounter (HOSPITAL_COMMUNITY): Payer: Self-pay | Admitting: Anesthesiology

## 2017-01-10 ENCOUNTER — Inpatient Hospital Stay (HOSPITAL_COMMUNITY): Payer: Medicaid Other | Admitting: Certified Registered"

## 2017-01-10 ENCOUNTER — Inpatient Hospital Stay (HOSPITAL_COMMUNITY)
Admission: EM | Admit: 2017-01-10 | Discharge: 2017-02-01 | DRG: 023 | Disposition: A | Discharge status: Rehabilitation Facility | Payer: Medicaid Other | Attending: General Surgery | Admitting: General Surgery

## 2017-01-10 ENCOUNTER — Encounter (HOSPITAL_COMMUNITY): Payer: Self-pay

## 2017-01-10 DIAGNOSIS — S066X9A Traumatic subarachnoid hemorrhage with loss of consciousness of unspecified duration, initial encounter: Principal | ICD-10-CM | POA: Diagnosis present

## 2017-01-10 DIAGNOSIS — E1165 Type 2 diabetes mellitus with hyperglycemia: Secondary | ICD-10-CM | POA: Diagnosis present

## 2017-01-10 DIAGNOSIS — I619 Nontraumatic intracerebral hemorrhage, unspecified: Secondary | ICD-10-CM | POA: Diagnosis present

## 2017-01-10 DIAGNOSIS — E872 Acidosis: Secondary | ICD-10-CM | POA: Diagnosis present

## 2017-01-10 DIAGNOSIS — S06339A Contusion and laceration of cerebrum, unspecified, with loss of consciousness of unspecified duration, initial encounter: Secondary | ICD-10-CM

## 2017-01-10 DIAGNOSIS — Z79899 Other long term (current) drug therapy: Secondary | ICD-10-CM

## 2017-01-10 DIAGNOSIS — Z0189 Encounter for other specified special examinations: Secondary | ICD-10-CM

## 2017-01-10 DIAGNOSIS — D62 Acute posthemorrhagic anemia: Secondary | ICD-10-CM | POA: Diagnosis not present

## 2017-01-10 DIAGNOSIS — Z09 Encounter for follow-up examination after completed treatment for conditions other than malignant neoplasm: Secondary | ICD-10-CM

## 2017-01-10 DIAGNOSIS — J9690 Respiratory failure, unspecified, unspecified whether with hypoxia or hypercapnia: Secondary | ICD-10-CM | POA: Diagnosis present

## 2017-01-10 DIAGNOSIS — R319 Hematuria, unspecified: Secondary | ICD-10-CM | POA: Diagnosis not present

## 2017-01-10 DIAGNOSIS — S0219XA Other fracture of base of skull, initial encounter for closed fracture: Secondary | ICD-10-CM | POA: Diagnosis present

## 2017-01-10 DIAGNOSIS — Z7982 Long term (current) use of aspirin: Secondary | ICD-10-CM | POA: Diagnosis not present

## 2017-01-10 DIAGNOSIS — J189 Pneumonia, unspecified organism: Secondary | ICD-10-CM

## 2017-01-10 DIAGNOSIS — Z7984 Long term (current) use of oral hypoglycemic drugs: Secondary | ICD-10-CM | POA: Diagnosis not present

## 2017-01-10 DIAGNOSIS — E876 Hypokalemia: Secondary | ICD-10-CM | POA: Diagnosis not present

## 2017-01-10 DIAGNOSIS — R402432 Glasgow coma scale score 3-8, at arrival to emergency department: Secondary | ICD-10-CM | POA: Diagnosis present

## 2017-01-10 DIAGNOSIS — B9620 Unspecified Escherichia coli [E. coli] as the cause of diseases classified elsewhere: Secondary | ICD-10-CM | POA: Diagnosis not present

## 2017-01-10 DIAGNOSIS — E871 Hypo-osmolality and hyponatremia: Secondary | ICD-10-CM | POA: Diagnosis not present

## 2017-01-10 DIAGNOSIS — W109XXA Fall (on) (from) unspecified stairs and steps, initial encounter: Secondary | ICD-10-CM | POA: Diagnosis present

## 2017-01-10 DIAGNOSIS — J398 Other specified diseases of upper respiratory tract: Secondary | ICD-10-CM

## 2017-01-10 DIAGNOSIS — J15211 Pneumonia due to Methicillin susceptible Staphylococcus aureus: Secondary | ICD-10-CM | POA: Diagnosis not present

## 2017-01-10 DIAGNOSIS — Z978 Presence of other specified devices: Secondary | ICD-10-CM

## 2017-01-10 DIAGNOSIS — S299XXA Unspecified injury of thorax, initial encounter: Secondary | ICD-10-CM

## 2017-01-10 DIAGNOSIS — I6200 Nontraumatic subdural hemorrhage, unspecified: Secondary | ICD-10-CM | POA: Diagnosis present

## 2017-01-10 DIAGNOSIS — S065XAA Traumatic subdural hemorrhage with loss of consciousness status unknown, initial encounter: Secondary | ICD-10-CM

## 2017-01-10 DIAGNOSIS — G936 Cerebral edema: Secondary | ICD-10-CM | POA: Diagnosis present

## 2017-01-10 DIAGNOSIS — S065X9A Traumatic subdural hemorrhage with loss of consciousness of unspecified duration, initial encounter: Secondary | ICD-10-CM | POA: Diagnosis present

## 2017-01-10 DIAGNOSIS — S0990XA Unspecified injury of head, initial encounter: Secondary | ICD-10-CM

## 2017-01-10 DIAGNOSIS — N390 Urinary tract infection, site not specified: Secondary | ICD-10-CM | POA: Diagnosis not present

## 2017-01-10 DIAGNOSIS — R29818 Other symptoms and signs involving the nervous system: Secondary | ICD-10-CM

## 2017-01-10 DIAGNOSIS — W19XXXA Unspecified fall, initial encounter: Secondary | ICD-10-CM | POA: Diagnosis present

## 2017-01-10 DIAGNOSIS — R29898 Other symptoms and signs involving the musculoskeletal system: Secondary | ICD-10-CM

## 2017-01-10 LAB — RAPID URINE DRUG SCREEN, HOSP PERFORMED
Amphetamines: NOT DETECTED
Barbiturates: NOT DETECTED
Benzodiazepines: NOT DETECTED
Cocaine: NOT DETECTED
Opiates: NOT DETECTED
Tetrahydrocannabinol: NOT DETECTED

## 2017-01-10 LAB — COMPREHENSIVE METABOLIC PANEL
ALT: 24 U/L (ref 17–63)
AST: 37 U/L (ref 15–41)
Albumin: 4.2 g/dL (ref 3.5–5.0)
Alkaline Phosphatase: 45 U/L (ref 38–126)
Anion gap: 18 — ABNORMAL HIGH (ref 5–15)
BILIRUBIN TOTAL: 0.9 mg/dL (ref 0.3–1.2)
BUN: 18 mg/dL (ref 6–20)
CO2: 22 mmol/L (ref 22–32)
CREATININE: 1 mg/dL (ref 0.61–1.24)
Calcium: 9.2 mg/dL (ref 8.9–10.3)
Chloride: 101 mmol/L (ref 101–111)
GFR calc Af Amer: >60 mL/min (ref >60)
Glucose, Bld: 456 mg/dL — ABNORMAL HIGH (ref 65–99)
Potassium: 4.5 mmol/L (ref 3.5–5.1)
Sodium: 141 mmol/L (ref 135–145)
TOTAL PROTEIN: 7.6 g/dL (ref 6.5–8.1)

## 2017-01-10 LAB — PROTIME-INR
INR: 1.04
PROTHROMBIN TIME: 13.7 s (ref 11.4–15.2)

## 2017-01-10 LAB — BLOOD GAS, ARTERIAL
Acid-base deficit: 1.1 mmol/L (ref 0.0–2.0)
Bicarbonate: 22.8 mmol/L (ref 20.0–28.0)
DRAWN BY: 24513
FIO2: 60
MECHVT: 400 mL
O2 SAT: 99.1 %
PEEP: 5 cmH2O
PH ART: 7.418 (ref 7.350–7.450)
Patient temperature: 98.6
RATE: 15 resp/min
pCO2 arterial: 35.9 mmHg (ref 32.0–48.0)
pO2, Arterial: 163 mmHg — ABNORMAL HIGH (ref 83.0–108.0)

## 2017-01-10 LAB — I-STAT VENOUS BLOOD GAS, ED
ACID-BASE DEFICIT: 2 mmol/L (ref 0.0–2.0)
BICARBONATE: 22.5 mmol/L (ref 20.0–28.0)
O2 SAT: 48 %
PH VEN: 7.415 (ref 7.250–7.430)
TCO2: 24 mmol/L (ref 0–100)
pCO2, Ven: 35.2 mmHg — ABNORMAL LOW (ref 44.0–60.0)
pO2, Ven: 25 mmHg — CL (ref 32.0–45.0)

## 2017-01-10 LAB — ETHANOL: ALCOHOL ETHYL (B): 6 mg/dL — AB (ref <5)

## 2017-01-10 LAB — I-STAT CG4 LACTIC ACID, ED: Lactic Acid, Venous: 7.79 mmol/L (ref 0.5–1.9)

## 2017-01-10 LAB — CBC WITH DIFFERENTIAL/PLATELET
Basophils Absolute: 0 10*3/uL (ref 0.0–0.1)
Basophils Relative: 0 %
EOS ABS: 0 10*3/uL (ref 0.0–0.7)
EOS PCT: 0 %
HCT: 40.6 % (ref 39.0–52.0)
HEMOGLOBIN: 14.5 g/dL (ref 13.0–17.0)
LYMPHS ABS: 0.6 10*3/uL — AB (ref 0.7–4.0)
LYMPHS PCT: 4 %
MCH: 31 pg (ref 26.0–34.0)
MCHC: 35.7 g/dL (ref 30.0–36.0)
MCV: 86.8 fL (ref 78.0–100.0)
MONOS PCT: 4 %
Monocytes Absolute: 0.5 10*3/uL (ref 0.1–1.0)
NEUTROS PCT: 92 %
Neutro Abs: 14.1 10*3/uL — ABNORMAL HIGH (ref 1.7–7.7)
Platelets: 212 10*3/uL (ref 150–400)
RBC: 4.68 MIL/uL (ref 4.22–5.81)
RDW: 12.6 % (ref 11.5–15.5)
WBC: 15.2 10*3/uL — ABNORMAL HIGH (ref 4.0–10.5)

## 2017-01-10 LAB — ACETAMINOPHEN LEVEL: Acetaminophen (Tylenol), Serum: <10 ug/mL — ABNORMAL LOW (ref 10–30)

## 2017-01-10 LAB — GLUCOSE, CAPILLARY
GLUCOSE-CAPILLARY: 240 mg/dL — AB (ref 65–99)
GLUCOSE-CAPILLARY: 257 mg/dL — AB (ref 65–99)
Glucose-Capillary: 367 mg/dL — ABNORMAL HIGH (ref 65–99)

## 2017-01-10 LAB — URINALYSIS, ROUTINE W REFLEX MICROSCOPIC
BILIRUBIN URINE: NEGATIVE
Bacteria, UA: NONE SEEN
KETONES UR: 20 mg/dL — AB
LEUKOCYTES UA: NEGATIVE
Nitrite: NEGATIVE
Protein, ur: NEGATIVE mg/dL
RBC / HPF: NONE SEEN RBC/hpf (ref 0–5)
Specific Gravity, Urine: 1.024 (ref 1.005–1.030)
Squamous Epithelial / LPF: NONE SEEN
pH: 5 (ref 5.0–8.0)

## 2017-01-10 LAB — TRIGLYCERIDES: TRIGLYCERIDES: 213 mg/dL — AB (ref <150)

## 2017-01-10 LAB — I-STAT CHEM 8, ED
BUN: 23 mg/dL — AB (ref 6–20)
CALCIUM ION: 1.02 mmol/L — AB (ref 1.15–1.40)
CHLORIDE: 102 mmol/L (ref 101–111)
Creatinine, Ser: 0.7 mg/dL (ref 0.61–1.24)
GLUCOSE: 471 mg/dL — AB (ref 65–99)
HCT: 41 % (ref 39.0–52.0)
Hemoglobin: 13.9 g/dL (ref 13.0–17.0)
Potassium: 4.4 mmol/L (ref 3.5–5.1)
Sodium: 140 mmol/L (ref 135–145)
TCO2: 23 mmol/L (ref 0–100)

## 2017-01-10 LAB — MRSA PCR SCREENING: MRSA BY PCR: NEGATIVE

## 2017-01-10 LAB — SALICYLATE LEVEL: Salicylate Lvl: <7 mg/dL (ref 2.8–30.0)

## 2017-01-10 LAB — LACTIC ACID, PLASMA: Lactic Acid, Venous: 2 mmol/L (ref 0.5–1.9)

## 2017-01-10 MED ORDER — IOPAMIDOL (ISOVUE-300) INJECTION 61%
INTRAVENOUS | Status: AC
  Administered 2017-01-10: 100 mL
  Filled 2017-01-10: qty 100

## 2017-01-10 MED ORDER — DEXTROSE 5 % IV SOLN
500.0000 mg | Freq: Once | INTRAVENOUS | Status: AC
  Administered 2017-01-10: 500 mg via INTRAVENOUS
  Filled 2017-01-10: qty 500

## 2017-01-10 MED ORDER — FENTANYL BOLUS VIA INFUSION
50.0000 ug | INTRAVENOUS | Status: DC | PRN
  Filled 2017-01-10: qty 50

## 2017-01-10 MED ORDER — ORAL CARE MOUTH RINSE
15.0000 mL | OROMUCOSAL | Status: DC
  Administered 2017-01-11 – 2017-01-19 (×84): 15 mL via OROMUCOSAL

## 2017-01-10 MED ORDER — FENTANYL 2500MCG IN NS 250ML (10MCG/ML) PREMIX INFUSION
25.0000 ug/h | INTRAVENOUS | Status: DC
  Administered 2017-01-10: 50 ug/h via INTRAVENOUS
  Administered 2017-01-13: 125 ug/h via INTRAVENOUS
  Administered 2017-01-13: 150 ug/h via INTRAVENOUS
  Administered 2017-01-14: 100 ug/h via INTRAVENOUS
  Administered 2017-01-15 – 2017-01-17 (×2): 50 ug/h via INTRAVENOUS
  Filled 2017-01-10 (×7): qty 250

## 2017-01-10 MED ORDER — PROPOFOL 1000 MG/100ML IV EMUL
5.0000 ug/kg/min | INTRAVENOUS | Status: DC
  Administered 2017-01-10: 60 ug/kg/min via INTRAVENOUS
  Administered 2017-01-10: 50 ug/kg/min via INTRAVENOUS
  Administered 2017-01-11 (×2): 25 ug/kg/min via INTRAVENOUS
  Administered 2017-01-11: 40 ug/kg/min via INTRAVENOUS
  Administered 2017-01-12: 25.012 ug/kg/min via INTRAVENOUS
  Administered 2017-01-12: 35 ug/kg/min via INTRAVENOUS
  Administered 2017-01-13: 25 ug/kg/min via INTRAVENOUS
  Administered 2017-01-13: 45 ug/kg/min via INTRAVENOUS
  Administered 2017-01-13: 25 ug/kg/min via INTRAVENOUS
  Administered 2017-01-14: 60 ug/kg/min via INTRAVENOUS
  Administered 2017-01-14: 40 ug/kg/min via INTRAVENOUS
  Administered 2017-01-14: 10 ug/kg/min via INTRAVENOUS
  Administered 2017-01-15: 30 ug/kg/min via INTRAVENOUS
  Filled 2017-01-10 (×15): qty 100

## 2017-01-10 MED ORDER — FENTANYL CITRATE (PF) 100 MCG/2ML IJ SOLN: 50.0000 ug | Freq: Once | INTRAMUSCULAR | Status: DC

## 2017-01-10 MED ORDER — METRONIDAZOLE IN NACL 5-0.79 MG/ML-% IV SOLN
500.0000 mg | Freq: Once | INTRAVENOUS | Status: DC
Start: 2017-01-10 — End: 2017-01-10
  Filled 2017-01-10: qty 100

## 2017-01-10 MED ORDER — PROPOFOL 10 MG/ML IV BOLUS
INTRAVENOUS | Status: DC | PRN
  Administered 2017-01-10: 120 mg via INTRAVENOUS

## 2017-01-10 MED ORDER — SUCCINYLCHOLINE CHLORIDE 20 MG/ML IJ SOLN
INTRAMUSCULAR | Status: DC | PRN
  Administered 2017-01-10: 100 mg via INTRAVENOUS

## 2017-01-10 MED ORDER — CIPROFLOXACIN-DEXAMETHASONE 0.3-0.1 % OT SUSP
4.0000 [drp] | Freq: Two times a day (BID) | OTIC | Status: AC
  Administered 2017-01-10 – 2017-01-16 (×13): 4 [drp] via OTIC
  Filled 2017-01-10: qty 7.5

## 2017-01-10 MED ORDER — SODIUM CHLORIDE 0.9 % IV SOLN
1000.0000 mg | Freq: Once | INTRAVENOUS | Status: AC
  Administered 2017-01-10: 1000 mg via INTRAVENOUS
  Filled 2017-01-10: qty 10

## 2017-01-10 MED ORDER — SODIUM CHLORIDE 0.9 % IV BOLUS (SEPSIS)
1000.0000 mL | Freq: Once | INTRAVENOUS | Status: AC
  Administered 2017-01-10: 1000 mL via INTRAVENOUS

## 2017-01-10 MED ORDER — SODIUM CHLORIDE 0.9 % IV SOLN
500.0000 mg | Freq: Two times a day (BID) | INTRAVENOUS | Status: DC
  Administered 2017-01-11 – 2017-01-15 (×10): 500 mg via INTRAVENOUS
  Filled 2017-01-10 (×11): qty 5

## 2017-01-10 MED ORDER — METRONIDAZOLE IN NACL 5-0.79 MG/ML-% IV SOLN
500.0000 mg | Freq: Once | INTRAVENOUS | Status: AC
  Administered 2017-01-10: 500 mg via INTRAVENOUS
  Filled 2017-01-10: qty 100

## 2017-01-10 MED ORDER — INSULIN ASPART 100 UNIT/ML ~~LOC~~ SOLN
0.0000 [IU] | SUBCUTANEOUS | Status: DC
  Administered 2017-01-10: 5 [IU] via SUBCUTANEOUS
  Administered 2017-01-10: 15 [IU] via SUBCUTANEOUS
  Administered 2017-01-11: 8 [IU] via SUBCUTANEOUS
  Administered 2017-01-11: 5 [IU] via SUBCUTANEOUS
  Administered 2017-01-11 (×2): 8 [IU] via SUBCUTANEOUS
  Administered 2017-01-11: 2 [IU] via SUBCUTANEOUS
  Administered 2017-01-11: 8 [IU] via SUBCUTANEOUS
  Administered 2017-01-11: 3 [IU] via SUBCUTANEOUS
  Administered 2017-01-12 (×2): 5 [IU] via SUBCUTANEOUS
  Administered 2017-01-12 – 2017-01-13 (×7): 3 [IU] via SUBCUTANEOUS
  Administered 2017-01-13: 5 [IU] via SUBCUTANEOUS
  Administered 2017-01-13 – 2017-01-14 (×3): 3 [IU] via SUBCUTANEOUS
  Administered 2017-01-14: 5 [IU] via SUBCUTANEOUS
  Administered 2017-01-14: 8 [IU] via SUBCUTANEOUS
  Administered 2017-01-14: 5 [IU] via SUBCUTANEOUS
  Administered 2017-01-14: 3 [IU] via SUBCUTANEOUS
  Administered 2017-01-15 (×2): 5 [IU] via SUBCUTANEOUS
  Administered 2017-01-15: 3 [IU] via SUBCUTANEOUS
  Administered 2017-01-15: 5 [IU] via SUBCUTANEOUS
  Administered 2017-01-15: 3 [IU] via SUBCUTANEOUS
  Administered 2017-01-16 (×5): 5 [IU] via SUBCUTANEOUS
  Administered 2017-01-16 – 2017-01-17 (×7): 3 [IU] via SUBCUTANEOUS
  Administered 2017-01-18: 2 [IU] via SUBCUTANEOUS
  Administered 2017-01-18 (×3): 3 [IU] via SUBCUTANEOUS
  Administered 2017-01-18: 5 [IU] via SUBCUTANEOUS
  Administered 2017-01-18 – 2017-01-19 (×2): 3 [IU] via SUBCUTANEOUS
  Administered 2017-01-19: 5 [IU] via SUBCUTANEOUS
  Administered 2017-01-19: 2 [IU] via SUBCUTANEOUS
  Administered 2017-01-19 – 2017-01-20 (×4): 3 [IU] via SUBCUTANEOUS
  Administered 2017-01-20: 5 [IU] via SUBCUTANEOUS
  Administered 2017-01-20 – 2017-01-21 (×3): 3 [IU] via SUBCUTANEOUS
  Administered 2017-01-21: 5 [IU] via SUBCUTANEOUS
  Administered 2017-01-21: 2 [IU] via SUBCUTANEOUS
  Administered 2017-01-21: 3 [IU] via SUBCUTANEOUS
  Administered 2017-01-22: 5 [IU] via SUBCUTANEOUS
  Administered 2017-01-22: 2 [IU] via SUBCUTANEOUS
  Administered 2017-01-22 (×3): 3 [IU] via SUBCUTANEOUS
  Administered 2017-01-22: 2 [IU] via SUBCUTANEOUS
  Administered 2017-01-23 (×6): 3 [IU] via SUBCUTANEOUS
  Administered 2017-01-24: 5 [IU] via SUBCUTANEOUS
  Administered 2017-01-24 (×5): 3 [IU] via SUBCUTANEOUS
  Administered 2017-01-25 (×2): 5 [IU] via SUBCUTANEOUS
  Administered 2017-01-25: 3 [IU] via SUBCUTANEOUS
  Administered 2017-01-25: 2 [IU] via SUBCUTANEOUS
  Administered 2017-01-25: 3 [IU] via SUBCUTANEOUS
  Administered 2017-01-25: 5 [IU] via SUBCUTANEOUS
  Administered 2017-01-26 (×4): 3 [IU] via SUBCUTANEOUS
  Administered 2017-01-27 (×4): 2 [IU] via SUBCUTANEOUS
  Administered 2017-01-27: 3 [IU] via SUBCUTANEOUS
  Administered 2017-01-28: 2 [IU] via SUBCUTANEOUS
  Administered 2017-01-28: 5 [IU] via SUBCUTANEOUS
  Administered 2017-01-28: 3 [IU] via SUBCUTANEOUS
  Administered 2017-01-28: 2 [IU] via SUBCUTANEOUS
  Administered 2017-01-28 – 2017-01-29 (×2): 3 [IU] via SUBCUTANEOUS
  Administered 2017-01-29 (×2): 2 [IU] via SUBCUTANEOUS
  Administered 2017-01-29 – 2017-01-30 (×4): 3 [IU] via SUBCUTANEOUS
  Administered 2017-01-30: 2 [IU] via SUBCUTANEOUS
  Administered 2017-01-31 (×2): 3 [IU] via SUBCUTANEOUS
  Administered 2017-01-31 – 2017-02-01 (×2): 2 [IU] via SUBCUTANEOUS
  Administered 2017-02-01: 3 [IU] via SUBCUTANEOUS
  Administered 2017-02-01: 2 [IU] via SUBCUTANEOUS

## 2017-01-10 MED ORDER — DEXTROSE 5 % IV SOLN
1.0000 g | Freq: Once | INTRAVENOUS | Status: AC
  Administered 2017-01-10: 1 g via INTRAVENOUS
  Filled 2017-01-10: qty 10

## 2017-01-10 MED ORDER — ACETAMINOPHEN 325 MG PO TABS
650.0000 mg | ORAL_TABLET | ORAL | Status: DC | PRN
  Administered 2017-01-11 – 2017-01-29 (×11): 650 mg via ORAL
  Filled 2017-01-10 (×11): qty 2

## 2017-01-10 MED ORDER — PROPOFOL 1000 MG/100ML IV EMUL
INTRAVENOUS | Status: AC
  Filled 2017-01-10: qty 100

## 2017-01-10 MED ORDER — SODIUM CHLORIDE 0.9 % IV SOLN
INTRAVENOUS | Status: DC
  Administered 2017-01-10 – 2017-01-21 (×9): via INTRAVENOUS
  Administered 2017-01-22: 50 mL/h via INTRAVENOUS
  Administered 2017-01-23 – 2017-01-31 (×7): via INTRAVENOUS

## 2017-01-10 MED ORDER — FENTANYL CITRATE (PF) 100 MCG/2ML IJ SOLN
INTRAMUSCULAR | Status: AC
  Filled 2017-01-10: qty 2

## 2017-01-10 MED ORDER — HYDRALAZINE HCL 20 MG/ML IJ SOLN: 10.0000 mg | INTRAMUSCULAR | Status: DC | PRN

## 2017-01-10 MED ORDER — M.V.I. ADULT IV INJ
INJECTION | Freq: Once | INTRAVENOUS | Status: DC
  Filled 2017-01-10: qty 1000

## 2017-01-10 MED ORDER — ACETAMINOPHEN 650 MG RE SUPP
650.0000 mg | RECTAL | Status: DC | PRN
  Administered 2017-01-10 – 2017-01-27 (×3): 650 mg via RECTAL
  Filled 2017-01-10 (×3): qty 1

## 2017-01-10 MED ORDER — FENTANYL CITRATE (PF) 100 MCG/2ML IJ SOLN
12.5000 ug | INTRAMUSCULAR | Status: DC | PRN
Start: 2017-01-10 — End: 2017-01-14
  Administered 2017-01-10: 50 ug via INTRAVENOUS

## 2017-01-10 MED ORDER — CHLORHEXIDINE GLUCONATE 0.12% ORAL RINSE (MEDLINE KIT)
15.0000 mL | Freq: Two times a day (BID) | OROMUCOSAL | Status: DC
  Administered 2017-01-10 – 2017-01-19 (×18): 15 mL via OROMUCOSAL

## 2017-01-10 NOTE — ED Notes (Signed)
Pt taken to CT 3 at this time 

## 2017-01-10 NOTE — ED Notes (Signed)
Pt family came out reporting that pt was wet. Pt changed by this RN and tech.

## 2017-01-10 NOTE — ED Notes (Signed)
Pt brought back to TRAUMA C from CT without incident

## 2017-01-10 NOTE — Progress Notes (Signed)
Notified Trauma MD about patient's GCS going from 8 to 6.  Also, informed that patient has been ST since he arrived on unit.  Patient does have a fever.  Orders received to continue monitor patient for airway protection and to give tylenol RE to treat fever.  Will continue to monitor patient.

## 2017-01-10 NOTE — Anesthesia Procedure Notes (Signed)
Procedure Name: Intubation Date/Time: 01/10/2017 7:27 PM Performed by: Arlice ColtMANESS, Dyllon Henken B Pre-anesthesia Checklist: Patient identified, Emergency Drugs available, Suction available, Patient being monitored and Timeout performed Patient Re-evaluated:Patient Re-evaluated prior to inductionOxygen Delivery Method: Circle system utilized Preoxygenation: Pre-oxygenation with 100% oxygen Intubation Type: IV induction and Rapid sequence Grade View: Grade I Tube type: Subglottic suction tube Tube size: 8.0 mm Number of attempts: 1 Airway Equipment and Method: Stylet and Video-laryngoscopy Placement Confirmation: ETT inserted through vocal cords under direct vision and positive ETCO2 Secured at: 22 cm Tube secured with: Tape Dental Injury: Teeth and Oropharynx as per pre-operative assessment

## 2017-01-10 NOTE — ED Notes (Signed)
Dr. Donell BeersByerly at bedside with trauma

## 2017-01-10 NOTE — ED Notes (Addendum)
Family at bedside and Dr. Madilyn Hookees at bedside providing family with an update. His sister Elease Etiennedriana Garcia states the patient has a roommate, who reports pt was drinking alcohol, he told her he would be right back, she heard him fall down 3 steps and went to him and called neighbors for help to put him on the couch. This was around 12am. She went to sleep and woke up this morning and went to check on pt and found him minimally responsive and covered in feces.

## 2017-01-10 NOTE — ED Notes (Signed)
Neuro surgeon at bedside at 1250

## 2017-01-10 NOTE — ED Triage Notes (Signed)
Pt arrived via [facility]; According to EMS, the patients family told them the patient was drinking alcohol last night, was found by family this morning and was not responded. EMS arrived to find the patient only responsive to pain. O2 sats 96%, RR unlabored and even. Pt still only responsive to pain. Pt arrived covered from head to toe in feces. Pt cleaned up upon arrival. CBG was 522 en route.

## 2017-01-10 NOTE — ED Notes (Signed)
Pt has returned from CT without incident 

## 2017-01-10 NOTE — Progress Notes (Addendum)
CRITICAL VALUE ALERT  Critical value received: Lactic Acid 2.0  Date of notification:  01/10/2017  Time of notification:  1635  Critical value read back:Yes.    Nurse who received alert:  Serena ColonelYeni Pineda, RN

## 2017-01-10 NOTE — Consult Note (Signed)
Anesthesiology Note:  Asked to intubate 51 year old hispanic male admitted this morning after having been found down. Head CT shows cerebral contusions with small R. subdural hematoma. Labs notable for glucose 471. He has been unresponsive to commands, no signs of airway obstruction at present but he has been exhibiting decerebrate posturing on the l. Side.  Patient given 150 mg propofol, 120 mg succinyl choline. Intubated with glide scope, 8.0 glottic suction ETT placed with out difficulty. Chords well visualized. Dried blood noted in mouth and posterior pharynx. BBSE, ETT taped at 22 cm at lip. (+) CO2 color change.  BP 105/70 HR-1105 O2 sat 100% following intubation.  Kipp Broodavid [place] Noyce

## 2017-01-10 NOTE — ED Notes (Signed)
Dr. Madilyn Hookees states to hold off on Banana Bag at this time

## 2017-01-10 NOTE — H&P (Signed)
History   [name] is an 51 y.o. male.   Chief Complaint:  Chief Complaint  Patient presents with  . Altered Mental Status    Pt is a 51 yo M who was found down this morning.  He was initially worked up as a medical admission with a low grade fever and mental status changes.  Once his family found him, more information came to light.  He was at a friend's house and was drinking.  He was assumed to be somewhere sleeping it off.  This morning they found him near the bottom of a staircase and they couldn't wake him up. He is assumed to have fallen.   He is unable to give any history.    Past Medical History:  Diagnosis Date  . Diabetes mellitus without complication ([place])     History reviewed. No pertinent surgical history.  Family History  Problem Relation Age of Onset  . Diabetes Mother   . Diabetes Father    Social History:  reports that he has never smoked. He has never used smokeless tobacco. He reports that he drinks alcohol. He reports that he does not use drugs.  Allergies  No Known Allergies  Home Medications   (Not in a hospital admission)  Trauma Course   Results for orders placed or performed during the hospital encounter of 01/10/17 (from the past 48 hour(s))  Comprehensive metabolic panel     Status: Abnormal   Collection Time: 01/10/17 10:22 AM  Result Value Ref Range   Sodium 141 135 - 145 mmol/L   Potassium 4.5 3.5 - 5.1 mmol/L   Chloride 101 101 - 111 mmol/L   CO2 22 22 - 32 mmol/L   Glucose, Bld 456 (H) 65 - 99 mg/dL   BUN 18 6 - 20 mg/dL   Creatinine, Ser 1.00 0.61 - 1.24 mg/dL   Calcium 9.2 8.9 - 10.3 mg/dL   Total Protein 7.6 6.5 - 8.1 g/dL   Albumin 4.2 3.5 - 5.0 g/dL   AST 37 15 - 41 U/L   ALT 24 17 - 63 U/L   Alkaline Phosphatase 45 38 - 126 U/L   Total Bilirubin 0.9 0.3 - 1.2 mg/dL   GFR calc non Af Amer >60 >60 mL/min   GFR calc Af Amer >60 >60 mL/min    Comment: (NOTE) The eGFR has been calculated using the CKD EPI  equation. This calculation has not been validated in all clinical situations. eGFR's persistently <60 mL/min signify possible Chronic Kidney Disease.    Anion gap 18 (H) 5 - 15  Ethanol     Status: Abnormal   Collection Time: 01/10/17 10:22 AM  Result Value Ref Range   Alcohol, Ethyl (B) 6 (H) <5 mg/dL    Comment:        LOWEST DETECTABLE LIMIT FOR SERUM ALCOHOL IS 5 mg/dL FOR MEDICAL PURPOSES ONLY   Acetaminophen level     Status: Abnormal   Collection Time: 01/10/17 10:22 AM  Result Value Ref Range   Acetaminophen (Tylenol), Serum <10 (L) 10 - 30 ug/mL    Comment:        THERAPEUTIC CONCENTRATIONS VARY SIGNIFICANTLY. A RANGE OF 10-30 ug/mL MAY BE AN EFFECTIVE CONCENTRATION FOR MANY PATIENTS. HOWEVER, SOME ARE BEST TREATED AT CONCENTRATIONS OUTSIDE THIS RANGE. ACETAMINOPHEN CONCENTRATIONS >150 ug/mL AT 4 HOURS AFTER INGESTION AND >50 ug/mL AT 12 HOURS AFTER INGESTION ARE OFTEN ASSOCIATED WITH TOXIC REACTIONS.   Salicylate level     Status: None  Collection Time: 01/10/17 10:22 AM  Result Value Ref Range   Salicylate Lvl <7.6 2.8 - 30.0 mg/dL  CBC with Differential     Status: Abnormal   Collection Time: 01/10/17 10:22 AM  Result Value Ref Range   WBC 15.2 (H) 4.0 - 10.5 K/uL   RBC 4.68 4.22 - 5.81 MIL/uL   Hemoglobin 14.5 13.0 - 17.0 g/dL   HCT 40.6 39.0 - 52.0 %   MCV 86.8 78.0 - 100.0 fL   MCH 31.0 26.0 - 34.0 pg   MCHC 35.7 30.0 - 36.0 g/dL   RDW 12.6 11.5 - 15.5 %   Platelets 212 150 - 400 K/uL   Neutrophils Relative % 92 %   Neutro Abs 14.1 (H) 1.7 - 7.7 K/uL   Lymphocytes Relative 4 %   Lymphs Abs 0.6 (L) 0.7 - 4.0 K/uL   Monocytes Relative 4 %   Monocytes Absolute 0.5 0.1 - 1.0 K/uL   Eosinophils Relative 0 %   Eosinophils Absolute 0.0 0.0 - 0.7 K/uL   Basophils Relative 0 %   Basophils Absolute 0.0 0.0 - 0.1 K/uL  Protime-INR     Status: None   Collection Time: 01/10/17 10:22 AM  Result Value Ref Range   Prothrombin Time 13.7 11.4 - 15.2  seconds   INR 1.04   I-stat Chem 8, ED     Status: Abnormal   Collection Time: 01/10/17 10:26 AM  Result Value Ref Range   Sodium 140 135 - 145 mmol/L   Potassium 4.4 3.5 - 5.1 mmol/L   Chloride 102 101 - 111 mmol/L   BUN 23 (H) 6 - 20 mg/dL   Creatinine, Ser 0.70 0.61 - 1.24 mg/dL   Glucose, Bld 471 (H) 65 - 99 mg/dL   Calcium, Ion 1.02 (L) 1.15 - 1.40 mmol/L   TCO2 23 0 - 100 mmol/L   Hemoglobin 13.9 13.0 - 17.0 g/dL   HCT 41.0 39.0 - 52.0 %  I-Stat CG4 Lactic Acid, ED     Status: Abnormal   Collection Time: 01/10/17 10:27 AM  Result Value Ref Range   Lactic Acid, Venous 7.79 (HH) 0.5 - 1.9 mmol/L   Comment NOTIFIED PHYSICIAN   I-Stat venous blood gas, ED     Status: Abnormal   Collection Time: 01/10/17 10:28 AM  Result Value Ref Range   pH, Ven 7.415 7.250 - 7.430   pCO2, Ven 35.2 (L) 44.0 - 60.0 mmHg   pO2, Ven 25.0 (LL) 32.0 - 45.0 mmHg   Bicarbonate 22.5 20.0 - 28.0 mmol/L   TCO2 24 0 - 100 mmol/L   O2 Saturation 48.0 %   Acid-base deficit 2.0 0.0 - 2.0 mmol/L   Patient temperature HIDE    Sample type VENOUS    Comment NOTIFIED PHYSICIAN   Urinalysis, Routine w reflex microscopic     Status: Abnormal   Collection Time: 01/10/17 12:43 PM  Result Value Ref Range   Color, Urine STRAW (A) YELLOW   APPearance CLEAR CLEAR   Specific Gravity, Urine 1.024 1.005 - 1.030   pH 5.0 5.0 - 8.0   Glucose, UA >=500 (A) NEGATIVE mg/dL   Hgb urine dipstick SMALL (A) NEGATIVE   Bilirubin Urine NEGATIVE NEGATIVE   Ketones, ur 20 (A) NEGATIVE mg/dL   Protein, ur NEGATIVE NEGATIVE mg/dL   Nitrite NEGATIVE NEGATIVE   Leukocytes, UA NEGATIVE NEGATIVE   RBC / HPF NONE SEEN 0 - 5 RBC/hpf   WBC, UA 0-5 0 - 5 WBC/hpf  Bacteria, UA NONE SEEN NONE SEEN   Squamous Epithelial / LPF NONE SEEN NONE SEEN   Ct Head Wo Contrast  Result Date: 01/10/2017 CLINICAL DATA:  51 year old male with fall and altered mental status. Initial encounter. EXAM: CT HEAD WITHOUT CONTRAST CT CERVICAL SPINE  WITHOUT CONTRAST TECHNIQUE: Multidetector CT imaging of the head and cervical spine was performed following the standard protocol without intravenous contrast. Multiplanar CT image reconstructions of the cervical spine were also generated. COMPARISON:  None. FINDINGS: CT HEAD FINDINGS Brain: Scattered areas of intraparenchymal hemorrhage bilaterally are noted, with the largest area measuring 4.5 cm the right frontal region. Smaller areas of hemorrhage are noted within the right temporal, right frontal, left temporal and left parietal lobes. A right subdural hematoma is noted measuring 6.5 mm in greatest diameter. Scattered areas of subarachnoid hemorrhage are identified, greatest in the high right frontal region. There is a 7 mm right to left midline shift. A small amount of intraventricular hemorrhage in the left occipital forearm identified. Vascular: No hyperdense vessels identified. Skull: A nondisplaced vertical fracture through the left mastoid air cells/left temporal bone noted with left mastoid effusion. The left middle ear is clear. Sinuses/Orbits: No acute finding. Other: Left scalp soft tissue swelling is noted. CT CERVICAL SPINE FINDINGS Alignment: Normal. Skull base and vertebrae: No cervical spine fracture identified. No focal bony lesions present. Soft tissues and spinal canal: No prevertebral fluid or swelling. No visible canal hematoma. Disc levels: Mild degenerative disc disease and spondylosis at C4-5 noted. Upper chest: Negative. Other: A fracture through the left mastoid/temporal bone noted with left mastoid effusion. IMPRESSION: Intracranial hemorrhage with 4.5 cm right frontal intraparenchymal hemorrhage, 6 mm right subdural hematoma and other scattered areas of intraparenchymal and subarachnoid hemorrhage as described. 7 mm right to left midline shift. Nondisplaced vertical fracture through the left temporal bone. Consider high-resolution CT as clinically indicated. No static evidence of  acute injury to the cervical spine. Critical Value/emergent results were called by telephone at the time of interpretation on 01/10/2017 at 11:45 am to Dr. Quintella Reichert , who verbally acknowledged these results. Electronically Signed   By: Margarette Canada M.D.   On: 01/10/2017 11:50   Ct Chest W Contrast  Result Date: 01/10/2017 CLINICAL DATA:  51 year old male with fall and unresponsive. Traumatic head injury. EXAM: CT CHEST, ABDOMEN, AND PELVIS WITH CONTRAST TECHNIQUE: Multidetector CT imaging of the chest, abdomen and pelvis was performed following the standard protocol during bolus administration of intravenous contrast. CONTRAST:  12m ISOVUE-300 IOPAMIDOL (ISOVUE-300) INJECTION 61% COMPARISON:  None. FINDINGS: CT CHEST FINDINGS Cardiovascular: No significant vascular findings. Normal heart size. No pericardial effusion. There is no evidence of thoracic aortic aneurysm. Mediastinum/Nodes: No enlarged mediastinal, hilar, or axillary lymph nodes. Thyroid gland, trachea, and esophagus demonstrate no significant findings. Lungs/Pleura: Mild central and bilateral lower lung ground-glass opacities are nonspecific and may represent edema, aspiration or infection. There is no evidence of consolidation, pleural effusion, pulmonary mass for pneumothorax. Musculoskeletal: No chest wall mass or suspicious bone lesions identified. CT ABDOMEN PELVIS FINDINGS Hepatobiliary: Mild hepatic steatosis noted. The liver and gallbladder are otherwise unremarkable. No biliary dilatation. Pancreas: Unremarkable Spleen: Unremarkable Adrenals/Urinary Tract: The bladder is markedly distended. There is moderate right and mild left hydroureteronephrosis which may be related to the distended bladder is no obstructing cause is identified. No solid renal mass or urinary calculi identified. The adrenal glands are unremarkable. Stomach/Bowel: Stomach is within normal limits. Appendix appears normal. No evidence of bowel wall thickening,  distention,  or inflammatory changes. Vascular/Lymphatic: Aortic atherosclerotic calcifications noted without aneurysm. No enlarged lymph nodes identified. Reproductive: Mild prostate enlargement noted. Other: No free fluid, focal collection or pneumoperitoneum. Musculoskeletal: No acute or suspicious abnormalities. Moderate degenerative disc disease and spondylosis at L5-S1 noted. IMPRESSION: No evidence of acute injury within the chest, abdomen or pelvis. Mild central bilateral lower lung ground-glass opacities which are nonspecific but may represent edema or infection. Aspiration is a consideration. Markedly distended bladder with moderate right and mild left hydroureteronephrosis. Mild hepatic steatosis and mild prostate enlargement. Abdominal aortic atherosclerosis. Electronically Signed   By: Margarette Canada M.D.   On: 01/10/2017 12:38   Ct Cervical Spine Wo Contrast  Result Date: 01/10/2017 CLINICAL DATA:  51 year old male with fall and altered mental status. Initial encounter. EXAM: CT HEAD WITHOUT CONTRAST CT CERVICAL SPINE WITHOUT CONTRAST TECHNIQUE: Multidetector CT imaging of the head and cervical spine was performed following the standard protocol without intravenous contrast. Multiplanar CT image reconstructions of the cervical spine were also generated. COMPARISON:  None. FINDINGS: CT HEAD FINDINGS Brain: Scattered areas of intraparenchymal hemorrhage bilaterally are noted, with the largest area measuring 4.5 cm the right frontal region. Smaller areas of hemorrhage are noted within the right temporal, right frontal, left temporal and left parietal lobes. A right subdural hematoma is noted measuring 6.5 mm in greatest diameter. Scattered areas of subarachnoid hemorrhage are identified, greatest in the high right frontal region. There is a 7 mm right to left midline shift. A small amount of intraventricular hemorrhage in the left occipital forearm identified. Vascular: No hyperdense vessels identified.  Skull: A nondisplaced vertical fracture through the left mastoid air cells/left temporal bone noted with left mastoid effusion. The left middle ear is clear. Sinuses/Orbits: No acute finding. Other: Left scalp soft tissue swelling is noted. CT CERVICAL SPINE FINDINGS Alignment: Normal. Skull base and vertebrae: No cervical spine fracture identified. No focal bony lesions present. Soft tissues and spinal canal: No prevertebral fluid or swelling. No visible canal hematoma. Disc levels: Mild degenerative disc disease and spondylosis at C4-5 noted. Upper chest: Negative. Other: A fracture through the left mastoid/temporal bone noted with left mastoid effusion. IMPRESSION: Intracranial hemorrhage with 4.5 cm right frontal intraparenchymal hemorrhage, 6 mm right subdural hematoma and other scattered areas of intraparenchymal and subarachnoid hemorrhage as described. 7 mm right to left midline shift. Nondisplaced vertical fracture through the left temporal bone. Consider high-resolution CT as clinically indicated. No static evidence of acute injury to the cervical spine. Critical Value/emergent results were called by telephone at the time of interpretation on 01/10/2017 at 11:45 am to Dr. Quintella Reichert , who verbally acknowledged these results. Electronically Signed   By: Margarette Canada M.D.   On: 01/10/2017 11:50   Ct Abdomen Pelvis W Contrast  Result Date: 01/10/2017 CLINICAL DATA:  51 year old male with fall and unresponsive. Traumatic head injury. EXAM: CT CHEST, ABDOMEN, AND PELVIS WITH CONTRAST TECHNIQUE: Multidetector CT imaging of the chest, abdomen and pelvis was performed following the standard protocol during bolus administration of intravenous contrast. CONTRAST:  122m ISOVUE-300 IOPAMIDOL (ISOVUE-300) INJECTION 61% COMPARISON:  None. FINDINGS: CT CHEST FINDINGS Cardiovascular: No significant vascular findings. Normal heart size. No pericardial effusion. There is no evidence of thoracic aortic aneurysm.  Mediastinum/Nodes: No enlarged mediastinal, hilar, or axillary lymph nodes. Thyroid gland, trachea, and esophagus demonstrate no significant findings. Lungs/Pleura: Mild central and bilateral lower lung ground-glass opacities are nonspecific and may represent edema, aspiration or infection. There is no evidence of consolidation, pleural effusion, pulmonary  mass for pneumothorax. Musculoskeletal: No chest wall mass or suspicious bone lesions identified. CT ABDOMEN PELVIS FINDINGS Hepatobiliary: Mild hepatic steatosis noted. The liver and gallbladder are otherwise unremarkable. No biliary dilatation. Pancreas: Unremarkable Spleen: Unremarkable Adrenals/Urinary Tract: The bladder is markedly distended. There is moderate right and mild left hydroureteronephrosis which may be related to the distended bladder is no obstructing cause is identified. No solid renal mass or urinary calculi identified. The adrenal glands are unremarkable. Stomach/Bowel: Stomach is within normal limits. Appendix appears normal. No evidence of bowel wall thickening, distention, or inflammatory changes. Vascular/Lymphatic: Aortic atherosclerotic calcifications noted without aneurysm. No enlarged lymph nodes identified. Reproductive: Mild prostate enlargement noted. Other: No free fluid, focal collection or pneumoperitoneum. Musculoskeletal: No acute or suspicious abnormalities. Moderate degenerative disc disease and spondylosis at L5-S1 noted. IMPRESSION: No evidence of acute injury within the chest, abdomen or pelvis. Mild central bilateral lower lung ground-glass opacities which are nonspecific but may represent edema or infection. Aspiration is a consideration. Markedly distended bladder with moderate right and mild left hydroureteronephrosis. Mild hepatic steatosis and mild prostate enlargement. Abdominal aortic atherosclerosis. Electronically Signed   By: Margarette Canada M.D.   On: 01/10/2017 12:38   Dg Chest Port 1 View  Result Date:  01/10/2017 CLINICAL DATA:  51 year old male with altered mental status and fever. EXAM: PORTABLE CHEST 1 VIEW COMPARISON:  None. FINDINGS: The cardiomediastinal silhouette is unremarkable. There is no evidence of focal airspace disease, pulmonary edema, suspicious pulmonary nodule/mass, pleural effusion, or pneumothorax. No acute bony abnormalities are identified. IMPRESSION: No active disease. Electronically Signed   By: Margarette Canada M.D.   On: 01/10/2017 10:30   Ct Maxillofacial Wo Cm  Result Date: 01/10/2017 CLINICAL DATA:  51 year old male status post fall with altered mental status. Hemorrhagic cerebral contusions with subarachnoid hemorrhage and subdural hematoma on the head CT earlier today. EXAM: CT MAXILLOFACIAL WITHOUT CONTRAST TECHNIQUE: Multidetector CT imaging of the maxillofacial structures was performed. Multiplanar CT image reconstructions were also generated. A small metallic BB was placed on the right temple in order to reliably differentiate right from left. COMPARISON:  Head and cervical spine CT 1009 hours today FINDINGS: Motion artifact on the initial axial images. Osseous: Mandible intact. Poor dentition right mandible bicuspid. No maxilla fracture. No zygoma fracture. No definite acute nasal bone fracture. Pterygoids are intact. The central skullbase appears intact. There is a nondepressed horizontally oriented left lateral skull fracture through the squamousal portion of the temporal bone best seen on coronal series 13, image 47. On sagittal images (series 14, image 73) there appears to be a second parallel nondisplaced fracture which tracks from the the lateral left temporal bone anteriorly to the greater wing of the sphenoid (series 14, image 62) and perhaps into the lateral wall of the left orbit (image 58). This orbital component also appears to beyond series 11, image 13. There is no associated orbital soft tissue abnormality. There is a superimposed nondisplaced vertically-oriented  fracture through the left mastoid air cells. There is fluid/blood throughout the mastoids and also in the left inferior temp panic cavity (hypo 10 planum) and throughout the left EAC. The left ossicles appear to remain normally aligned. Orbits: Aside from the suggestion of a nondisplaced fracture of the left skull which travels into the lateral wall of the left orbit (detailed above), the orbital walls appear intact. Bilateral orbits soft tissues appear normal. Sinuses: Left mastoids and middle ear described above. Mild paranasal sinus mucosal thickening. Right tympanic cavity and mastoids are clear. Soft  tissues: Negative visualized noncontrast larynx, pharynx (retained secretions in the hypopharynx), parapharyngeal spaces, retropharyngeal space, sublingual space, submandibular glands and parotid glands. The masticator spaces appear normal. Limited intracranial: Partially visible extensive right frontal and temporal hemorrhagic contusions plus right side subdural hematoma. Smaller posterior left temporal lobe hemorrhagic contusion also re- demonstrated. IMPRESSION: 1. Parallel nondisplaced horizontally oriented skull fractures along the left lateral convexity (sagittal series 14, image 73) tracking anteriorly to the greater wing of the left sphenoid. There may be extension into the left lateral wall of the orbit, but the orbits otherwise appear normal. 2. Vertically-oriented fracture through the left mastoid bone and air cells with fluid/blood in the left middle ear, mastoids, and EAC. The left ossicles appear intact. 3. No facial fracture identified. 4. Partially visible extensive intracranial hemorrhage as seen on the head CT today. Electronically Signed   By: Genevie Ann M.D.   On: 01/10/2017 12:58    Review of Systems  Unable to perform ROS: Patient unresponsive    Blood pressure 146/73, pulse (!) 124, temperature 100.7 F (38.2 C), temperature source Rectal, resp. rate 26, height '5\' 2"'$  (1.575 m), weight  77.1 kg (170 lb), SpO2 97 %. Physical Exam  Vitals reviewed. Constitutional: He appears well-developed and well-nourished. He appears distressed.  HENT:  Right Ear: External ear normal.  Left Ear: External ear normal.  Mouth/Throat: Oropharynx is clear and moist.  Eyes: Conjunctivae are normal. Pupils are equal, round, and reactive to light. Right eye exhibits no discharge. Left eye exhibits no discharge. No scleral icterus.  Neck: Neck supple. No tracheal deviation present. No thyromegaly present.  Cardiovascular: Regular rhythm, normal heart sounds and intact distal pulses.  Exam reveals no gallop and no friction rub.   No murmur heard. Respiratory: Effort normal and breath sounds normal. No respiratory distress. He has no wheezes. He has no rales. He exhibits no tenderness.  GI: Soft. He exhibits no distension. There is no tenderness. There is no rebound.  Musculoskeletal: He exhibits no edema, tenderness or deformity.  Lymphadenopathy:    He has no cervical adenopathy.  Neurological: He is unresponsive. GCS eye subscore is 1. GCS verbal subscore is 2. GCS motor subscore is 5.  Skin: Skin is warm and dry. No rash noted. He is not diaphoretic. No erythema. No pallor.  Psychiatric:  Unable to assess.     Assessment/Plan Presumed fall with SDH/IPH Mastoid fracture in temporal bone Low grade fever DM with hyperglycemia Lactic acidosis  ICU admission Neurosurgery consult- Dr. Ronnald Ramp. Pt with GCS 8, but appears to be protecting airway well. At this point will hold on intubation.  Can better assess neuro status and improvement if no meds given.   Have d/w Dr. Redmond Baseman of ENT.   Given skull fracture, trauma most likely mechanism of injury. NPO Insulin sliding scale for DM Resuscitate for acidosis.   Discussed with sister at bedside.    [name] 01/10/2017, 1:42 PM   Procedures

## 2017-01-10 NOTE — ED Provider Notes (Signed)
[facility]-EMERGENCY DEPT Provider Note   CSN: [id number] Arrival date & time: 01/10/17  4098     History   Chief Complaint Chief Complaint  Patient presents with  . Altered Mental Status    HPI [name] is a 51 y.o. male.  The history is provided by the EMS personnel. No language interpreter was used.    [name] is a 51 y.o. male who presents to the Emergency Department complaining of AMS.  Level V caveat due to AMS.  History is provided by EMS. Per report he came in his house last night intoxicated and attempted to go up the stairs and got up about 3 stairs before falling back down to the living room floor. He slept on the floor in a fetal position and had stooled and urinated on himself. He was minimally responsive this morning in his roomate and called 911.   After ED arrival additional history available from the patient's sister. His sister. states that the roommate told her he had been drinking last night and he fell down the stairs and the roommate went to help him get to the couch. When he did not wake up this morning she called 911. He came home around midnight. Past Medical History:  Diagnosis Date  . Diabetes mellitus without complication [facility])     Patient Active Problem List   Diagnosis Date Noted  . Diabetes type 2, uncontrolled (HCC) 06/13/2015    History reviewed. No pertinent surgical history.     Home Medications    Prior to Admission medications   Medication Sig Start Date End Date Taking? Authorizing Provider  aspirin EC 81 MG tablet Take 1 tablet (81 mg total) by mouth daily. 06/02/16   Sherren Mocha, MD  atorvastatin (LIPITOR) 40 MG tablet Take 1 tablet (40 mg total) by mouth daily at 6 PM. 06/14/16   Sherren Mocha, MD  glipiZIDE (GLUCOTROL) 5 MG tablet Take 1 tablet (5 mg total) by mouth daily before breakfast. 06/02/16   Sherren Mocha, MD  Linagliptin-Metformin HCl 2.03-999 MG TABS Take 1 tablet by mouth 2 (two) times daily. With food  08/25/16   Sherren Mocha, MD  lisinopril (PRINIVIL,ZESTRIL) 2.5 MG tablet Take 1 tablet (2.5 mg total) by mouth daily. 06/14/16   Sherren Mocha, MD  naproxen (NAPROSYN) 500 MG tablet Take 1 tablet (500 mg total) by mouth 2 (two) times daily with a meal. 07/31/16   Doyle Askew, FNP    Family History Family History  Problem Relation Age of Onset  . Diabetes Mother   . Diabetes Father     Social History Social History  Substance Use Topics  . Smoking status: Never Smoker  . Smokeless tobacco: Never Used  . Alcohol use Yes     Comment: socially     Allergies   Patient has no known allergies.   Review of Systems Review of Systems  Unable to perform ROS: Mental status change     Physical Exam Updated Vital Signs BP 147/79   Pulse 115   Temp 100.7 F (38.2 C) (Rectal)   Resp 24   SpO2 96%   Physical Exam  Constitutional: He appears well-developed and well-nourished.  HENT:  Head: Normocephalic.  Dried blood in bilateral nares, left ear canal.  Eyes: Pupils are equal, round, and reactive to light.  Cardiovascular: Regular rhythm.   No murmur heard. tachycardic  Pulmonary/Chest: Effort normal and breath sounds normal. No respiratory distress.  Abdominal:  Soft. There is no tenderness. There is no rebound and no guarding.  Musculoskeletal: He exhibits no edema or tenderness.  Neurological:  Lethargic, moans to painful stimuli, localizes to pain.    Skin: Skin is warm and dry.  Psychiatric: He has a normal mood and affect. His behavior is normal.  Nursing note and vitals reviewed.    ED Treatments / Results  Labs (all labs ordered are listed, but only abnormal results are displayed) Labs Reviewed  COMPREHENSIVE METABOLIC PANEL - Abnormal; Notable for the following:       Result Value   Glucose, Bld 456 (*)    Anion gap 18 (*)    All other components within normal limits  ETHANOL - Abnormal; Notable for the following:    Alcohol, Ethyl (B) 6 (*)    All  other components within normal limits  ACETAMINOPHEN LEVEL - Abnormal; Notable for the following:    Acetaminophen (Tylenol), Serum <10 (*)    All other components within normal limits  CBC WITH DIFFERENTIAL/PLATELET - Abnormal; Notable for the following:    WBC 15.2 (*)    Neutro Abs 14.1 (*)    Lymphs Abs 0.6 (*)    All other components within normal limits  I-STAT CHEM 8, ED - Abnormal; Notable for the following:    BUN 23 (*)    Glucose, Bld 471 (*)    Calcium, Ion 1.02 (*)    All other components within normal limits  I-STAT CG4 LACTIC ACID, ED - Abnormal; Notable for the following:    Lactic Acid, Venous 7.79 (*)    All other components within normal limits  I-STAT VENOUS BLOOD GAS, ED - Abnormal; Notable for the following:    pCO2, Ven 35.2 (*)    pO2, Ven 25.0 (*)    All other components within normal limits  CULTURE, BLOOD (ROUTINE X 2)  CULTURE, BLOOD (ROUTINE X 2)  URINE CULTURE  SALICYLATE LEVEL  URINALYSIS, ROUTINE W REFLEX MICROSCOPIC  RAPID URINE DRUG SCREEN, HOSP PERFORMED  PROTIME-INR  CBG MONITORING, ED    EKG  EKG Interpretation  Date/Time:  Sunday January 10 2017 10:18:00 EDT Ventricular Rate:  117 PR Interval:    QRS Duration: 79 QT Interval:  331 QTC Calculation: 462 R Axis:   88 Text Interpretation:  Atrial fibrillation Confirmed by Lincoln Brighamees, Liz [phone]([id number]) on 01/10/2017 11:07:38 AM       Radiology Ct Head Wo Contrast  Result Date: 01/10/2017 CLINICAL DATA:  51 year old male with fall and altered mental status. Initial encounter. EXAM: CT HEAD WITHOUT CONTRAST CT CERVICAL SPINE WITHOUT CONTRAST TECHNIQUE: Multidetector CT imaging of the head and cervical spine was performed following the standard protocol without intravenous contrast. Multiplanar CT image reconstructions of the cervical spine were also generated. COMPARISON:  None. FINDINGS: CT HEAD FINDINGS Brain: Scattered areas of intraparenchymal hemorrhage bilaterally are noted, with the largest area  measuring 4.5 cm the right frontal region. Smaller areas of hemorrhage are noted within the right temporal, right frontal, left temporal and left parietal lobes. A right subdural hematoma is noted measuring 6.5 mm in greatest diameter. Scattered areas of subarachnoid hemorrhage are identified, greatest in the high right frontal region. There is a 7 mm right to left midline shift. A small amount of intraventricular hemorrhage in the left occipital forearm identified. Vascular: No hyperdense vessels identified. Skull: A nondisplaced vertical fracture through the left mastoid air cells/left temporal bone noted with left mastoid effusion. The left middle ear is clear. Sinuses/Orbits: No  acute finding. Other: Left scalp soft tissue swelling is noted. CT CERVICAL SPINE FINDINGS Alignment: Normal. Skull base and vertebrae: No cervical spine fracture identified. No focal bony lesions present. Soft tissues and spinal canal: No prevertebral fluid or swelling. No visible canal hematoma. Disc levels: Mild degenerative disc disease and spondylosis at C4-5 noted. Upper chest: Negative. Other: A fracture through the left mastoid/temporal bone noted with left mastoid effusion. IMPRESSION: Intracranial hemorrhage with 4.5 cm right frontal intraparenchymal hemorrhage, 6 mm right subdural hematoma and other scattered areas of intraparenchymal and subarachnoid hemorrhage as described. 7 mm right to left midline shift. Nondisplaced vertical fracture through the left temporal bone. Consider high-resolution CT as clinically indicated. No static evidence of acute injury to the cervical spine. Critical Value/emergent results were called by telephone at the time of interpretation on 01/10/2017 at 11:45 am to Dr. Tilden Fossa , who verbally acknowledged these results. Electronically Signed   By: Harmon Pier M.D.   On: 01/10/2017 11:50   Ct Cervical Spine Wo Contrast  Result Date: 01/10/2017 CLINICAL DATA:  51 year old male with fall and  altered mental status. Initial encounter. EXAM: CT HEAD WITHOUT CONTRAST CT CERVICAL SPINE WITHOUT CONTRAST TECHNIQUE: Multidetector CT imaging of the head and cervical spine was performed following the standard protocol without intravenous contrast. Multiplanar CT image reconstructions of the cervical spine were also generated. COMPARISON:  None. FINDINGS: CT HEAD FINDINGS Brain: Scattered areas of intraparenchymal hemorrhage bilaterally are noted, with the largest area measuring 4.5 cm the right frontal region. Smaller areas of hemorrhage are noted within the right temporal, right frontal, left temporal and left parietal lobes. A right subdural hematoma is noted measuring 6.5 mm in greatest diameter. Scattered areas of subarachnoid hemorrhage are identified, greatest in the high right frontal region. There is a 7 mm right to left midline shift. A small amount of intraventricular hemorrhage in the left occipital forearm identified. Vascular: No hyperdense vessels identified. Skull: A nondisplaced vertical fracture through the left mastoid air cells/left temporal bone noted with left mastoid effusion. The left middle ear is clear. Sinuses/Orbits: No acute finding. Other: Left scalp soft tissue swelling is noted. CT CERVICAL SPINE FINDINGS Alignment: Normal. Skull base and vertebrae: No cervical spine fracture identified. No focal bony lesions present. Soft tissues and spinal canal: No prevertebral fluid or swelling. No visible canal hematoma. Disc levels: Mild degenerative disc disease and spondylosis at C4-5 noted. Upper chest: Negative. Other: A fracture through the left mastoid/temporal bone noted with left mastoid effusion. IMPRESSION: Intracranial hemorrhage with 4.5 cm right frontal intraparenchymal hemorrhage, 6 mm right subdural hematoma and other scattered areas of intraparenchymal and subarachnoid hemorrhage as described. 7 mm right to left midline shift. Nondisplaced vertical fracture through the left  temporal bone. Consider high-resolution CT as clinically indicated. No static evidence of acute injury to the cervical spine. Critical Value/emergent results were called by telephone at the time of interpretation on 01/10/2017 at 11:45 am to Dr. Tilden Fossa , who verbally acknowledged these results. Electronically Signed   By: Harmon Pier M.D.   On: 01/10/2017 11:50   Dg Chest Port 1 View  Result Date: 01/10/2017 CLINICAL DATA:  51 year old male with altered mental status and fever. EXAM: PORTABLE CHEST 1 VIEW COMPARISON:  None. FINDINGS: The cardiomediastinal silhouette is unremarkable. There is no evidence of focal airspace disease, pulmonary edema, suspicious pulmonary nodule/mass, pleural effusion, or pneumothorax. No acute bony abnormalities are identified. IMPRESSION: No active disease. Electronically Signed   By: Henrietta Hoover.D.  On: 01/10/2017 10:30    Procedures Procedures (including critical care time) CRITICAL CARE Performed by: Tilden Fossa   Total critical care time: 45 minutes  Critical care time was exclusive of separately billable procedures and treating other patients.  Critical care was necessary to treat or prevent imminent or life-threatening deterioration.  Critical care was time spent personally by me on the following activities: development of treatment plan with patient and/or surrogate as well as nursing, discussions with consultants, evaluation of patient's response to treatment, examination of patient, obtaining history from patient or surrogate, ordering and performing treatments and interventions, ordering and review of laboratory studies, ordering and review of radiographic studies, pulse oximetry and re-evaluation of patient's condition.   Medications Ordered in ED Medications  azithromycin (ZITHROMAX) 500 mg in dextrose 5 % 250 mL IVPB (not administered)  metroNIDAZOLE (FLAGYL) IVPB 500 mg (not administered)  levETIRAcetam (KEPPRA) 1,000 mg in sodium  chloride 0.9 % 100 mL IVPB (1,000 mg Intravenous New Bag/Given 01/10/17 1232)  sodium chloride 0.9 % bolus 1,000 mL (0 mLs Intravenous Stopped 01/10/17 1125)  sodium chloride 0.9 % bolus 1,000 mL (0 mLs Intravenous Stopped 01/10/17 1200)  cefTRIAXone (ROCEPHIN) 1 g in dextrose 5 % 50 mL IVPB (1 g Intravenous New Bag/Given 01/10/17 1157)  iopamidol (ISOVUE-300) 61 % injection (100 mLs  Contrast Given 01/10/17 1205)     Initial Impression / Assessment and Plan / ED Course  I have reviewed the triage vital signs and the nursing notes.  Pertinent labs & imaging results that were available during my care of the patient were reviewed by me and considered in my medical decision making (see chart for details).     Patient brought in by EMS with history of diabetes and reports of vomiting, altered mental status and alcohol intoxication. Initially concern for toxidrome or medical illness given complaints. He was noted to be mildly febrile and tachycardic, lactate elevated. He was treated with IV fluids and antibiotics for possible pneumonia. There is lactic acidosis present that is not thought to be due to sepsis. Acidosis likely secondary to head injury and other medical illnesses.  There is minimal external evidence of trauma and no significant trauma reported based on history. CT head demonstrates subdural hematoma as well as proximal hemorrhage and small skull fracture. Additional trauma imaging obtained to evaluate for other injuries given limited external evidence of trauma and patient's altered mental status. Discussed with Dr. Yetta Barre with neurosurgery who will see the patient in consult. Trauma surgeon consulted for admission given his injuries.  Final Clinical Impressions(s) / ED Diagnoses   Final diagnoses:  Subdural hematoma [facility])    New Prescriptions New Prescriptions   No medications on file     Tilden Fossa, MD 01/10/17 [phone]

## 2017-01-10 NOTE — ED Notes (Signed)
Report attempted 

## 2017-01-10 NOTE — Progress Notes (Signed)
Notified Trauma MD about patient becoming more agitated and HR in the 120s.  Also, notified that patient was unable to protect airway.  He was aspirating saliva and unable to cough properly.  Orders received to give Fentanyl 12.5 mcg- 50 mcg q1h for pain.  MD calling OR for anesthesia to intubate patient.  Will continue to monitor patient.

## 2017-01-10 NOTE — Consult Note (Signed)
Reason for Consult:[facility] Referring Physician: EDP  [name] is an 51 y.o. male.   HPI:  51 year old Hispanic male to the emergency department with altered mental status. Mechanism of injury is unknown. The EDP suggested that she heard the patient was intoxicated and fell down "3 steps." Head CT showed multiple cerebral contusions and a small subdural hematoma and neurosurgical consultation was requested. Patient is unable to cooperate with history and physical exam. His sister is at the bedside and serves as an interpreter.  Past Medical History:  Diagnosis Date  . Diabetes mellitus without complication (HCC)     History reviewed. No pertinent surgical history.  No Known Allergies  Social History  Substance Use Topics  . Smoking status: Never Smoker  . Smokeless tobacco: Never Used  . Alcohol use Yes     Comment: socially    Family History  Problem Relation Age of Onset  . Diabetes Mother   . Diabetes Father      Review of Systems  Positive ROS: unable to obtain  All other systems have been reviewed and were otherwise negative with the exception of those mentioned in the HPI and as above.  Objective: Vital signs in last 24 hours: Temp:  [100.7 F (38.2 C)] 100.7 F (38.2 C) (03/11 1020) Pulse Rate:  [105-115] 115 (03/11 1145) Resp:  [20-24] 24 (03/11 1145) BP: (134-147)/(73-79) 147/79 (03/11 1145) SpO2:  [93 %-97 %] 96 % (03/11 1145) Weight:  [77.1 kg (170 lb)] 77.1 kg (170 lb) (03/11 1254)  General Appearance: Lethargic but arousable, opens eyes spontaneously, will not follow commands Head: Dried blood around the face and mouth Eyes: PERRL, conjunctiva/corneas clear, gaze conjugate    Neck: Supple, symmetrical, trachea midline Lungs: respirations unlabored Heart: Tachycardic but regular rhythm Abdomen: Soft Extremities: Multiple small abrasions to the lower extremities no obvious deformities  NEUROLOGIC:   Mental status: Lethargic, arousable,  cannot test speech or attention span or fund of knowledge or memory Motor Exam - localizes on the right, flexes and extends the left arm does seem somewhat weaker in that extremity when compared to the right, good muscle tone and bulk Sensory Exam - unable to examine Reflexes: symmetric, no pathologic reflexes Coordination - unable to examine Gait - unable to examine Balance - unable to examine Cranial Nerves: I: smell Not tested  II: visual acuity  OS: na    OD: na  II: visual fields   II: pupils Equal and reactive  III,VII: ptosis   III,IV,VI: extraocular muscles    V: mastication   V: facial light touch sensation    V,VII: corneal reflex  present  VII: facial muscle function - upper    VII: facial muscle function - lower   VIII: hearing   IX: soft palate elevation    IX,X: gag reflex   XI: trapezius strength    XI: sternocleidomastoid strength   XI: neck flexion strength    XII: tongue strength      Data Review Lab Results  Component Value Date   WBC 15.2 (H) 01/10/2017   HGB 13.9 01/10/2017   HCT 41.0 01/10/2017   MCV 86.8 01/10/2017   PLT 212 01/10/2017   Lab Results  Component Value Date   NA 140 01/10/2017   K 4.4 01/10/2017   CL 102 01/10/2017   CO2 22 01/10/2017   BUN 23 (H) 01/10/2017   CREATININE 0.70 01/10/2017   GLUCOSE 471 (H) 01/10/2017   Lab Results  Component Value Date  INR 1.04 01/10/2017    Radiology: Ct Head Wo Contrast  Result Date: 01/10/2017 CLINICAL DATA:  51 year old male with fall and altered mental status. Initial encounter. EXAM: CT HEAD WITHOUT CONTRAST CT CERVICAL SPINE WITHOUT CONTRAST TECHNIQUE: Multidetector CT imaging of the head and cervical spine was performed following the standard protocol without intravenous contrast. Multiplanar CT image reconstructions of the cervical spine were also generated. COMPARISON:  None. FINDINGS: CT HEAD FINDINGS Brain: Scattered areas of intraparenchymal hemorrhage bilaterally are noted, with  the largest area measuring 4.5 cm the right frontal region. Smaller areas of hemorrhage are noted within the right temporal, right frontal, left temporal and left parietal lobes. A right subdural hematoma is noted measuring 6.5 mm in greatest diameter. Scattered areas of subarachnoid hemorrhage are identified, greatest in the high right frontal region. There is a 7 mm right to left midline shift. A small amount of intraventricular hemorrhage in the left occipital forearm identified. Vascular: No hyperdense vessels identified. Skull: A nondisplaced vertical fracture through the left mastoid air cells/left temporal bone noted with left mastoid effusion. The left middle ear is clear. Sinuses/Orbits: No acute finding. Other: Left scalp soft tissue swelling is noted. CT CERVICAL SPINE FINDINGS Alignment: Normal. Skull base and vertebrae: No cervical spine fracture identified. No focal bony lesions present. Soft tissues and spinal canal: No prevertebral fluid or swelling. No visible canal hematoma. Disc levels: Mild degenerative disc disease and spondylosis at C4-5 noted. Upper chest: Negative. Other: A fracture through the left mastoid/temporal bone noted with left mastoid effusion. IMPRESSION: Intracranial hemorrhage with 4.5 cm right frontal intraparenchymal hemorrhage, 6 mm right subdural hematoma and other scattered areas of intraparenchymal and subarachnoid hemorrhage as described. 7 mm right to left midline shift. Nondisplaced vertical fracture through the left temporal bone. Consider high-resolution CT as clinically indicated. No static evidence of acute injury to the cervical spine. Critical Value/emergent results were called by telephone at the time of interpretation on 01/10/2017 at 11:45 am to Dr. Tilden FossaELIZABETH REES , who verbally acknowledged these results. Electronically Signed   By: Harmon PierJeffrey  Hu M.D.   On: 01/10/2017 11:50   Ct Chest W Contrast  Result Date: 01/10/2017 CLINICAL DATA:  51 year old male with  fall and unresponsive. Traumatic head injury. EXAM: CT CHEST, ABDOMEN, AND PELVIS WITH CONTRAST TECHNIQUE: Multidetector CT imaging of the chest, abdomen and pelvis was performed following the standard protocol during bolus administration of intravenous contrast. CONTRAST:  100mL ISOVUE-300 IOPAMIDOL (ISOVUE-300) INJECTION 61% COMPARISON:  None. FINDINGS: CT CHEST FINDINGS Cardiovascular: No significant vascular findings. Normal heart size. No pericardial effusion. There is no evidence of thoracic aortic aneurysm. Mediastinum/Nodes: No enlarged mediastinal, hilar, or axillary lymph nodes. Thyroid gland, trachea, and esophagus demonstrate no significant findings. Lungs/Pleura: Mild central and bilateral lower lung ground-glass opacities are nonspecific and may represent edema, aspiration or infection. There is no evidence of consolidation, pleural effusion, pulmonary mass for pneumothorax. Musculoskeletal: No chest wall mass or suspicious bone lesions identified. CT ABDOMEN PELVIS FINDINGS Hepatobiliary: Mild hepatic steatosis noted. The liver and gallbladder are otherwise unremarkable. No biliary dilatation. Pancreas: Unremarkable Spleen: Unremarkable Adrenals/Urinary Tract: The bladder is markedly distended. There is moderate right and mild left hydroureteronephrosis which may be related to the distended bladder is no obstructing cause is identified. No solid renal mass or urinary calculi identified. The adrenal glands are unremarkable. Stomach/Bowel: Stomach is within normal limits. Appendix appears normal. No evidence of bowel wall thickening, distention, or inflammatory changes. Vascular/Lymphatic: Aortic atherosclerotic calcifications noted without aneurysm. No enlarged  lymph nodes identified. Reproductive: Mild prostate enlargement noted. Other: No free fluid, focal collection or pneumoperitoneum. Musculoskeletal: No acute or suspicious abnormalities. Moderate degenerative disc disease and spondylosis at L5-S1  noted. IMPRESSION: No evidence of acute injury within the chest, abdomen or pelvis. Mild central bilateral lower lung ground-glass opacities which are nonspecific but may represent edema or infection. Aspiration is a consideration. Markedly distended bladder with moderate right and mild left hydroureteronephrosis. Mild hepatic steatosis and mild prostate enlargement. Abdominal aortic atherosclerosis. Electronically Signed   By: Harmon Pier M.D.   On: 01/10/2017 12:38   Ct Cervical Spine Wo Contrast  Result Date: 01/10/2017 CLINICAL DATA:  51 year old male with fall and altered mental status. Initial encounter. EXAM: CT HEAD WITHOUT CONTRAST CT CERVICAL SPINE WITHOUT CONTRAST TECHNIQUE: Multidetector CT imaging of the head and cervical spine was performed following the standard protocol without intravenous contrast. Multiplanar CT image reconstructions of the cervical spine were also generated. COMPARISON:  None. FINDINGS: CT HEAD FINDINGS Brain: Scattered areas of intraparenchymal hemorrhage bilaterally are noted, with the largest area measuring 4.5 cm the right frontal region. Smaller areas of hemorrhage are noted within the right temporal, right frontal, left temporal and left parietal lobes. A right subdural hematoma is noted measuring 6.5 mm in greatest diameter. Scattered areas of subarachnoid hemorrhage are identified, greatest in the high right frontal region. There is a 7 mm right to left midline shift. A small amount of intraventricular hemorrhage in the left occipital forearm identified. Vascular: No hyperdense vessels identified. Skull: A nondisplaced vertical fracture through the left mastoid air cells/left temporal bone noted with left mastoid effusion. The left middle ear is clear. Sinuses/Orbits: No acute finding. Other: Left scalp soft tissue swelling is noted. CT CERVICAL SPINE FINDINGS Alignment: Normal. Skull base and vertebrae: No cervical spine fracture identified. No focal bony lesions  present. Soft tissues and spinal canal: No prevertebral fluid or swelling. No visible canal hematoma. Disc levels: Mild degenerative disc disease and spondylosis at C4-5 noted. Upper chest: Negative. Other: A fracture through the left mastoid/temporal bone noted with left mastoid effusion. IMPRESSION: Intracranial hemorrhage with 4.5 cm right frontal intraparenchymal hemorrhage, 6 mm right subdural hematoma and other scattered areas of intraparenchymal and subarachnoid hemorrhage as described. 7 mm right to left midline shift. Nondisplaced vertical fracture through the left temporal bone. Consider high-resolution CT as clinically indicated. No static evidence of acute injury to the cervical spine. Critical Value/emergent results were called by telephone at the time of interpretation on 01/10/2017 at 11:45 am to Dr. Tilden Fossa , who verbally acknowledged these results. Electronically Signed   By: Harmon Pier M.D.   On: 01/10/2017 11:50   Ct Abdomen Pelvis W Contrast  Result Date: 01/10/2017 CLINICAL DATA:  51 year old male with fall and unresponsive. Traumatic head injury. EXAM: CT CHEST, ABDOMEN, AND PELVIS WITH CONTRAST TECHNIQUE: Multidetector CT imaging of the chest, abdomen and pelvis was performed following the standard protocol during bolus administration of intravenous contrast. CONTRAST:  ISOVUE-300 IOPAMIDOL (ISOVUE-300) INJECTION 61% COMPARISON:  None. FINDINGS: CT CHEST FINDINGS Cardiovascular: No significant vascular findings. Normal heart size. No pericardial effusion. There is no evidence of thoracic aortic aneurysm. Mediastinum/Nodes: No enlarged mediastinal, hilar, or axillary lymph nodes. Thyroid gland, trachea, and esophagus demonstrate no significant findings. Lungs/Pleura: Mild central and bilateral lower lung ground-glass opacities are nonspecific and may represent edema, aspiration or infection. There is no evidence of consolidation, pleural effusion, pulmonary mass for  pneumothorax. Musculoskeletal: No chest wall mass or suspicious bone  lesions identified. CT ABDOMEN PELVIS FINDINGS Hepatobiliary: Mild hepatic steatosis noted. The liver and gallbladder are otherwise unremarkable. No biliary dilatation. Pancreas: Unremarkable Spleen: Unremarkable Adrenals/Urinary Tract: The bladder is markedly distended. There is moderate right and mild left hydroureteronephrosis which may be related to the distended bladder is no obstructing cause is identified. No solid renal mass or urinary calculi identified. The adrenal glands are unremarkable. Stomach/Bowel: Stomach is within normal limits. Appendix appears normal. No evidence of bowel wall thickening, distention, or inflammatory changes. Vascular/Lymphatic: Aortic atherosclerotic calcifications noted without aneurysm. No enlarged lymph nodes identified. Reproductive: Mild prostate enlargement noted. Other: No free fluid, focal collection or pneumoperitoneum. Musculoskeletal: No acute or suspicious abnormalities. Moderate degenerative disc disease and spondylosis at L5-S1 noted. IMPRESSION: No evidence of acute injury within the chest, abdomen or pelvis. Mild central bilateral lower lung ground-glass opacities which are nonspecific but may represent edema or infection. Aspiration is a consideration. Markedly distended bladder with moderate right and mild left hydroureteronephrosis. Mild hepatic steatosis and mild prostate enlargement. Abdominal aortic atherosclerosis. Electronically Signed   By: Harmon Pier M.D.   On: 01/10/2017 12:38   Dg Chest Port 1 View  Result Date: 01/10/2017 CLINICAL DATA:  51 year old male with altered mental status and fever. EXAM: PORTABLE CHEST 1 VIEW COMPARISON:  None. FINDINGS: The cardiomediastinal silhouette is unremarkable. There is no evidence of focal airspace disease, pulmonary edema, suspicious pulmonary nodule/mass, pleural effusion, or pneumothorax. No acute bony abnormalities are identified.  IMPRESSION: No active disease. Electronically Signed   By: Harmon Pier M.D.   On: 01/10/2017 10:30     Assessment/Plan: 51 year old gentleman with an unknown mechanism of trauma who presents with altered mental status and significant cerebral contusions and a small right subdural hematoma. At this point intracranial pressure monitoring is not indicated. I suspect he can get worse before it gets better as he developed some cerebral edema over the next 3 days.  At the present time I would prefer for this to be a trauma admission since we have an unknown mechanism of trauma, but a head injury which would be more consistent with a more significant trauma mechanism such as an assault, fall down a long staircase, a motor vehicle accident, or a fall from some height. In that instance it is not uncommon to find injuries that are unrecognized in the emergency department during initial workup. It may be considered "overkill," and this may eventually turn out to be isolated head trauma, but right now we simply don't know that he has a high lactic acid, mild fever, and leukocytosis. Trauma scans such as chest abdomen and pelvis are pending.  Repeat head CT scheduled for tomorrow morning. He will need a neuro/ trauma ICU bed with frequent neuro checks. Maintain systolic blood pressure less than 180.   Lucilla Petrenko S 01/10/2017 12:56 PM

## 2017-01-10 NOTE — Consult Note (Signed)
Reason for Consult:Temporal bone fracture Referring Physician: Trauma  [name] is an 51 y.o. male.  HPI: 51 year old male who evidently was intoxicated last night and fell down stairs.  When found this morning, he was minimally responsive and had defecated and urinated on himself.  He was brought to the ER by EMS.  CT imaging demonstrates a left-side temporal bone fracture.  He is being admitted to ICU but has not required intubation.  Past Medical History:  Diagnosis Date  . Diabetes mellitus without complication ([place])     History reviewed. No pertinent surgical history.  Family History  Problem Relation Age of Onset  . Diabetes Mother   . Diabetes Father     Social History:  reports that he has never smoked. He has never used smokeless tobacco. He reports that he drinks alcohol. He reports that he does not use drugs.  Allergies: No Known Allergies  Medications: I have reviewed the patient's current medications.  Results for orders placed or performed during the hospital encounter of 01/10/17 (from the past 48 hour(s))  Comprehensive metabolic panel     Status: Abnormal   Collection Time: 01/10/17 10:22 AM  Result Value Ref Range   Sodium 141 135 - 145 mmol/L   Potassium 4.5 3.5 - 5.1 mmol/L   Chloride 101 101 - 111 mmol/L   CO2 22 22 - 32 mmol/L   Glucose, Bld 456 (H) 65 - 99 mg/dL   BUN 18 6 - 20 mg/dL   Creatinine, Ser 1.00 0.61 - 1.24 mg/dL   Calcium 9.2 8.9 - 10.3 mg/dL   Total Protein 7.6 6.5 - 8.1 g/dL   Albumin 4.2 3.5 - 5.0 g/dL   AST 37 15 - 41 U/L   ALT 24 17 - 63 U/L   Alkaline Phosphatase 45 38 - 126 U/L   Total Bilirubin 0.9 0.3 - 1.2 mg/dL   GFR calc non Af Amer >60 >60 mL/min   GFR calc Af Amer >60 >60 mL/min    Comment: (NOTE) The eGFR has been calculated using the CKD EPI equation. This calculation has not been validated in all clinical situations. eGFR's persistently <60 mL/min signify possible Chronic Kidney Disease.    Anion gap  18 (H) 5 - 15  Ethanol     Status: Abnormal   Collection Time: 01/10/17 10:22 AM  Result Value Ref Range   Alcohol, Ethyl (B) 6 (H) <5 mg/dL    Comment:        LOWEST DETECTABLE LIMIT FOR SERUM ALCOHOL IS 5 mg/dL FOR MEDICAL PURPOSES ONLY   Acetaminophen level     Status: Abnormal   Collection Time: 01/10/17 10:22 AM  Result Value Ref Range   Acetaminophen (Tylenol), Serum <10 (L) 10 - 30 ug/mL    Comment:        THERAPEUTIC CONCENTRATIONS VARY SIGNIFICANTLY. A RANGE OF 10-30 ug/mL MAY BE AN EFFECTIVE CONCENTRATION FOR MANY PATIENTS. HOWEVER, SOME ARE BEST TREATED AT CONCENTRATIONS OUTSIDE THIS RANGE. ACETAMINOPHEN CONCENTRATIONS >150 ug/mL AT 4 HOURS AFTER INGESTION AND >50 ug/mL AT 12 HOURS AFTER INGESTION ARE OFTEN ASSOCIATED WITH TOXIC REACTIONS.   Salicylate level     Status: None   Collection Time: 01/10/17 10:22 AM  Result Value Ref Range   Salicylate Lvl <7.9 2.8 - 30.0 mg/dL  CBC with Differential     Status: Abnormal   Collection Time: 01/10/17 10:22 AM  Result Value Ref Range   WBC 15.2 (H) 4.0 - 10.5 K/uL  RBC 4.68 4.22 - 5.81 MIL/uL   Hemoglobin 14.5 13.0 - 17.0 g/dL   HCT 40.6 39.0 - 52.0 %   MCV 86.8 78.0 - 100.0 fL   MCH 31.0 26.0 - 34.0 pg   MCHC 35.7 30.0 - 36.0 g/dL   RDW 12.6 11.5 - 15.5 %   Platelets 212 150 - 400 K/uL   Neutrophils Relative % 92 %   Neutro Abs 14.1 (H) 1.7 - 7.7 K/uL   Lymphocytes Relative 4 %   Lymphs Abs 0.6 (L) 0.7 - 4.0 K/uL   Monocytes Relative 4 %   Monocytes Absolute 0.5 0.1 - 1.0 K/uL   Eosinophils Relative 0 %   Eosinophils Absolute 0.0 0.0 - 0.7 K/uL   Basophils Relative 0 %   Basophils Absolute 0.0 0.0 - 0.1 K/uL  Protime-INR     Status: None   Collection Time: 01/10/17 10:22 AM  Result Value Ref Range   Prothrombin Time 13.7 11.4 - 15.2 seconds   INR 1.04   I-stat Chem 8, ED     Status: Abnormal   Collection Time: 01/10/17 10:26 AM  Result Value Ref Range   Sodium 140 135 - 145 mmol/L   Potassium 4.4  3.5 - 5.1 mmol/L   Chloride 102 101 - 111 mmol/L   BUN 23 (H) 6 - 20 mg/dL   Creatinine, Ser 0.70 0.61 - 1.24 mg/dL   Glucose, Bld 471 (H) 65 - 99 mg/dL   Calcium, Ion 1.02 (L) 1.15 - 1.40 mmol/L   TCO2 23 0 - 100 mmol/L   Hemoglobin 13.9 13.0 - 17.0 g/dL   HCT 41.0 39.0 - 52.0 %  I-Stat CG4 Lactic Acid, ED     Status: Abnormal   Collection Time: 01/10/17 10:27 AM  Result Value Ref Range   Lactic Acid, Venous 7.79 (HH) 0.5 - 1.9 mmol/L   Comment NOTIFIED PHYSICIAN   I-Stat venous blood gas, ED     Status: Abnormal   Collection Time: 01/10/17 10:28 AM  Result Value Ref Range   pH, Ven 7.415 7.250 - 7.430   pCO2, Ven 35.2 (L) 44.0 - 60.0 mmHg   pO2, Ven 25.0 (LL) 32.0 - 45.0 mmHg   Bicarbonate 22.5 20.0 - 28.0 mmol/L   TCO2 24 0 - 100 mmol/L   O2 Saturation 48.0 %   Acid-base deficit 2.0 0.0 - 2.0 mmol/L   Patient temperature HIDE    Sample type VENOUS    Comment NOTIFIED PHYSICIAN   Urinalysis, Routine w reflex microscopic     Status: Abnormal   Collection Time: 01/10/17 12:43 PM  Result Value Ref Range   Color, Urine STRAW (A) YELLOW   APPearance CLEAR CLEAR   Specific Gravity, Urine 1.024 1.005 - 1.030   pH 5.0 5.0 - 8.0   Glucose, UA >=500 (A) NEGATIVE mg/dL   Hgb urine dipstick SMALL (A) NEGATIVE   Bilirubin Urine NEGATIVE NEGATIVE   Ketones, ur 20 (A) NEGATIVE mg/dL   Protein, ur NEGATIVE NEGATIVE mg/dL   Nitrite NEGATIVE NEGATIVE   Leukocytes, UA NEGATIVE NEGATIVE   RBC / HPF NONE SEEN 0 - 5 RBC/hpf   WBC, UA 0-5 0 - 5 WBC/hpf   Bacteria, UA NONE SEEN NONE SEEN   Squamous Epithelial / LPF NONE SEEN NONE SEEN    Ct Head Wo Contrast  Result Date: 01/10/2017 CLINICAL DATA:  51 year old male with fall and altered mental status. Initial encounter. EXAM: CT HEAD WITHOUT CONTRAST CT CERVICAL SPINE WITHOUT CONTRAST TECHNIQUE: Multidetector  CT imaging of the head and cervical spine was performed following the standard protocol without intravenous contrast. Multiplanar CT  image reconstructions of the cervical spine were also generated. COMPARISON:  None. FINDINGS: CT HEAD FINDINGS Brain: Scattered areas of intraparenchymal hemorrhage bilaterally are noted, with the largest area measuring 4.5 cm the right frontal region. Smaller areas of hemorrhage are noted within the right temporal, right frontal, left temporal and left parietal lobes. A right subdural hematoma is noted measuring 6.5 mm in greatest diameter. Scattered areas of subarachnoid hemorrhage are identified, greatest in the high right frontal region. There is a 7 mm right to left midline shift. A small amount of intraventricular hemorrhage in the left occipital forearm identified. Vascular: No hyperdense vessels identified. Skull: A nondisplaced vertical fracture through the left mastoid air cells/left temporal bone noted with left mastoid effusion. The left middle ear is clear. Sinuses/Orbits: No acute finding. Other: Left scalp soft tissue swelling is noted. CT CERVICAL SPINE FINDINGS Alignment: Normal. Skull base and vertebrae: No cervical spine fracture identified. No focal bony lesions present. Soft tissues and spinal canal: No prevertebral fluid or swelling. No visible canal hematoma. Disc levels: Mild degenerative disc disease and spondylosis at C4-5 noted. Upper chest: Negative. Other: A fracture through the left mastoid/temporal bone noted with left mastoid effusion. IMPRESSION: Intracranial hemorrhage with 4.5 cm right frontal intraparenchymal hemorrhage, 6 mm right subdural hematoma and other scattered areas of intraparenchymal and subarachnoid hemorrhage as described. 7 mm right to left midline shift. Nondisplaced vertical fracture through the left temporal bone. Consider high-resolution CT as clinically indicated. No static evidence of acute injury to the cervical spine. Critical Value/emergent results were called by telephone at the time of interpretation on 01/10/2017 at 11:45 am to Dr. Quintella Reichert , who  verbally acknowledged these results. Electronically Signed   By: Margarette Canada M.D.   On: 01/10/2017 11:50   Ct Chest W Contrast  Result Date: 01/10/2017 CLINICAL DATA:  51 year old male with fall and unresponsive. Traumatic head injury. EXAM: CT CHEST, ABDOMEN, AND PELVIS WITH CONTRAST TECHNIQUE: Multidetector CT imaging of the chest, abdomen and pelvis was performed following the standard protocol during bolus administration of intravenous contrast. CONTRAST:  153m ISOVUE-300 IOPAMIDOL (ISOVUE-300) INJECTION 61% COMPARISON:  None. FINDINGS: CT CHEST FINDINGS Cardiovascular: No significant vascular findings. Normal heart size. No pericardial effusion. There is no evidence of thoracic aortic aneurysm. Mediastinum/Nodes: No enlarged mediastinal, hilar, or axillary lymph nodes. Thyroid gland, trachea, and esophagus demonstrate no significant findings. Lungs/Pleura: Mild central and bilateral lower lung ground-glass opacities are nonspecific and may represent edema, aspiration or infection. There is no evidence of consolidation, pleural effusion, pulmonary mass for pneumothorax. Musculoskeletal: No chest wall mass or suspicious bone lesions identified. CT ABDOMEN PELVIS FINDINGS Hepatobiliary: Mild hepatic steatosis noted. The liver and gallbladder are otherwise unremarkable. No biliary dilatation. Pancreas: Unremarkable Spleen: Unremarkable Adrenals/Urinary Tract: The bladder is markedly distended. There is moderate right and mild left hydroureteronephrosis which may be related to the distended bladder is no obstructing cause is identified. No solid renal mass or urinary calculi identified. The adrenal glands are unremarkable. Stomach/Bowel: Stomach is within normal limits. Appendix appears normal. No evidence of bowel wall thickening, distention, or inflammatory changes. Vascular/Lymphatic: Aortic atherosclerotic calcifications noted without aneurysm. No enlarged lymph nodes identified. Reproductive: Mild prostate  enlargement noted. Other: No free fluid, focal collection or pneumoperitoneum. Musculoskeletal: No acute or suspicious abnormalities. Moderate degenerative disc disease and spondylosis at L5-S1 noted. IMPRESSION: No evidence of acute injury within the  chest, abdomen or pelvis. Mild central bilateral lower lung ground-glass opacities which are nonspecific but may represent edema or infection. Aspiration is a consideration. Markedly distended bladder with moderate right and mild left hydroureteronephrosis. Mild hepatic steatosis and mild prostate enlargement. Abdominal aortic atherosclerosis. Electronically Signed   By: Margarette Canada M.D.   On: 01/10/2017 12:38   Ct Cervical Spine Wo Contrast  Result Date: 01/10/2017 CLINICAL DATA:  51 year old male with fall and altered mental status. Initial encounter. EXAM: CT HEAD WITHOUT CONTRAST CT CERVICAL SPINE WITHOUT CONTRAST TECHNIQUE: Multidetector CT imaging of the head and cervical spine was performed following the standard protocol without intravenous contrast. Multiplanar CT image reconstructions of the cervical spine were also generated. COMPARISON:  None. FINDINGS: CT HEAD FINDINGS Brain: Scattered areas of intraparenchymal hemorrhage bilaterally are noted, with the largest area measuring 4.5 cm the right frontal region. Smaller areas of hemorrhage are noted within the right temporal, right frontal, left temporal and left parietal lobes. A right subdural hematoma is noted measuring 6.5 mm in greatest diameter. Scattered areas of subarachnoid hemorrhage are identified, greatest in the high right frontal region. There is a 7 mm right to left midline shift. A small amount of intraventricular hemorrhage in the left occipital forearm identified. Vascular: No hyperdense vessels identified. Skull: A nondisplaced vertical fracture through the left mastoid air cells/left temporal bone noted with left mastoid effusion. The left middle ear is clear. Sinuses/Orbits: No acute  finding. Other: Left scalp soft tissue swelling is noted. CT CERVICAL SPINE FINDINGS Alignment: Normal. Skull base and vertebrae: No cervical spine fracture identified. No focal bony lesions present. Soft tissues and spinal canal: No prevertebral fluid or swelling. No visible canal hematoma. Disc levels: Mild degenerative disc disease and spondylosis at C4-5 noted. Upper chest: Negative. Other: A fracture through the left mastoid/temporal bone noted with left mastoid effusion. IMPRESSION: Intracranial hemorrhage with 4.5 cm right frontal intraparenchymal hemorrhage, 6 mm right subdural hematoma and other scattered areas of intraparenchymal and subarachnoid hemorrhage as described. 7 mm right to left midline shift. Nondisplaced vertical fracture through the left temporal bone. Consider high-resolution CT as clinically indicated. No static evidence of acute injury to the cervical spine. Critical Value/emergent results were called by telephone at the time of interpretation on 01/10/2017 at 11:45 am to Dr. Quintella Reichert , who verbally acknowledged these results. Electronically Signed   By: Margarette Canada M.D.   On: 01/10/2017 11:50   Ct Abdomen Pelvis W Contrast  Result Date: 01/10/2017 CLINICAL DATA:  51 year old male with fall and unresponsive. Traumatic head injury. EXAM: CT CHEST, ABDOMEN, AND PELVIS WITH CONTRAST TECHNIQUE: Multidetector CT imaging of the chest, abdomen and pelvis was performed following the standard protocol during bolus administration of intravenous contrast. CONTRAST:  165m ISOVUE-300 IOPAMIDOL (ISOVUE-300) INJECTION 61% COMPARISON:  None. FINDINGS: CT CHEST FINDINGS Cardiovascular: No significant vascular findings. Normal heart size. No pericardial effusion. There is no evidence of thoracic aortic aneurysm. Mediastinum/Nodes: No enlarged mediastinal, hilar, or axillary lymph nodes. Thyroid gland, trachea, and esophagus demonstrate no significant findings. Lungs/Pleura: Mild central and  bilateral lower lung ground-glass opacities are nonspecific and may represent edema, aspiration or infection. There is no evidence of consolidation, pleural effusion, pulmonary mass for pneumothorax. Musculoskeletal: No chest wall mass or suspicious bone lesions identified. CT ABDOMEN PELVIS FINDINGS Hepatobiliary: Mild hepatic steatosis noted. The liver and gallbladder are otherwise unremarkable. No biliary dilatation. Pancreas: Unremarkable Spleen: Unremarkable Adrenals/Urinary Tract: The bladder is markedly distended. There is moderate right and mild left hydroureteronephrosis  which may be related to the distended bladder is no obstructing cause is identified. No solid renal mass or urinary calculi identified. The adrenal glands are unremarkable. Stomach/Bowel: Stomach is within normal limits. Appendix appears normal. No evidence of bowel wall thickening, distention, or inflammatory changes. Vascular/Lymphatic: Aortic atherosclerotic calcifications noted without aneurysm. No enlarged lymph nodes identified. Reproductive: Mild prostate enlargement noted. Other: No free fluid, focal collection or pneumoperitoneum. Musculoskeletal: No acute or suspicious abnormalities. Moderate degenerative disc disease and spondylosis at L5-S1 noted. IMPRESSION: No evidence of acute injury within the chest, abdomen or pelvis. Mild central bilateral lower lung ground-glass opacities which are nonspecific but may represent edema or infection. Aspiration is a consideration. Markedly distended bladder with moderate right and mild left hydroureteronephrosis. Mild hepatic steatosis and mild prostate enlargement. Abdominal aortic atherosclerosis. Electronically Signed   By: Margarette Canada M.D.   On: 01/10/2017 12:38   Dg Chest Port 1 View  Result Date: 01/10/2017 CLINICAL DATA:  51 year old male with altered mental status and fever. EXAM: PORTABLE CHEST 1 VIEW COMPARISON:  None. FINDINGS: The cardiomediastinal silhouette is unremarkable.  There is no evidence of focal airspace disease, pulmonary edema, suspicious pulmonary nodule/mass, pleural effusion, or pneumothorax. No acute bony abnormalities are identified. IMPRESSION: No active disease. Electronically Signed   By: Margarette Canada M.D.   On: 01/10/2017 10:30   Ct Maxillofacial Wo Cm  Result Date: 01/10/2017 CLINICAL DATA:  51 year old male status post fall with altered mental status. Hemorrhagic cerebral contusions with subarachnoid hemorrhage and subdural hematoma on the head CT earlier today. EXAM: CT MAXILLOFACIAL WITHOUT CONTRAST TECHNIQUE: Multidetector CT imaging of the maxillofacial structures was performed. Multiplanar CT image reconstructions were also generated. A small metallic BB was placed on the right temple in order to reliably differentiate right from left. COMPARISON:  Head and cervical spine CT 1009 hours today FINDINGS: Motion artifact on the initial axial images. Osseous: Mandible intact. Poor dentition right mandible bicuspid. No maxilla fracture. No zygoma fracture. No definite acute nasal bone fracture. Pterygoids are intact. The central skullbase appears intact. There is a nondepressed horizontally oriented left lateral skull fracture through the squamousal portion of the temporal bone best seen on coronal series 13, image 47. On sagittal images (series 14, image 73) there appears to be a second parallel nondisplaced fracture which tracks from the the lateral left temporal bone anteriorly to the greater wing of the sphenoid (series 14, image 62) and perhaps into the lateral wall of the left orbit (image 58). This orbital component also appears to beyond series 11, image 13. There is no associated orbital soft tissue abnormality. There is a superimposed nondisplaced vertically-oriented fracture through the left mastoid air cells. There is fluid/blood throughout the mastoids and also in the left inferior temp panic cavity (hypo 10 planum) and throughout the left EAC. The  left ossicles appear to remain normally aligned. Orbits: Aside from the suggestion of a nondisplaced fracture of the left skull which travels into the lateral wall of the left orbit (detailed above), the orbital walls appear intact. Bilateral orbits soft tissues appear normal. Sinuses: Left mastoids and middle ear described above. Mild paranasal sinus mucosal thickening. Right tympanic cavity and mastoids are clear. Soft tissues: Negative visualized noncontrast larynx, pharynx (retained secretions in the hypopharynx), parapharyngeal spaces, retropharyngeal space, sublingual space, submandibular glands and parotid glands. The masticator spaces appear normal. Limited intracranial: Partially visible extensive right frontal and temporal hemorrhagic contusions plus right side subdural hematoma. Smaller posterior left temporal lobe hemorrhagic contusion also  re- demonstrated. IMPRESSION: 1. Parallel nondisplaced horizontally oriented skull fractures along the left lateral convexity (sagittal series 14, image 73) tracking anteriorly to the greater wing of the left sphenoid. There may be extension into the left lateral wall of the orbit, but the orbits otherwise appear normal. 2. Vertically-oriented fracture through the left mastoid bone and air cells with fluid/blood in the left middle ear, mastoids, and EAC. The left ossicles appear intact. 3. No facial fracture identified. 4. Partially visible extensive intracranial hemorrhage as seen on the head CT today. Electronically Signed   By: Genevie Ann M.D.   On: 01/10/2017 12:58    Review of Systems  Unable to perform ROS: Mental status change   Blood pressure 141/70, pulse 114, temperature 100.7 F (38.2 C), temperature source Rectal, resp. rate 26, height _0  (1.575 m), weight 170 lb (77.1 kg), SpO2 92 %. Physical Exam  Constitutional: He appears well-developed and well-nourished. No distress.  HENT:  Head: Normocephalic and atraumatic.  Right Ear: External ear  normal.  Left Ear: External ear normal.  Right EAC clear with intact tympanic membrane and aerated middle ear.  Left EAC obstructed by dry blood.  Dry blood at nares and teeth.  No active bleeding.  Eyes: Pupils are equal, round, and reactive to light.  Neck: Normal range of motion. Neck supple.  Cardiovascular: Normal rate.   Respiratory: Effort normal.  Musculoskeletal: Normal range of motion.  Neurological: No cranial nerve deficit.  Somnolent, not following commands.  Facial movement appears normal but difficult to fully evaluate.  Skin: Skin is warm and dry.  Psychiatric:  Somnolent.    Assessment/Plan: Left temporal bone fracture  I personally reviewed his head and maxillofacial CT scans demonstrating a non-displaced left temporal bone fracture with no involvement of the ossicles or otic capsule.  Facial movement appears to be normal.  He will need no intervention.  I will have the left ear treated with drops for one week.  He can follow-up in two weeks for reevaluation.  He will need an audiogram after full recovery.   Nalee Lightle 01/10/2017, 1:53 PM

## 2017-01-11 ENCOUNTER — Inpatient Hospital Stay (HOSPITAL_COMMUNITY): Payer: Medicaid Other

## 2017-01-11 LAB — HEMOGLOBIN A1C
HEMOGLOBIN A1C: 10.4 % — AB (ref 4.8–5.6)
MEAN PLASMA GLUCOSE: 252 mg/dL

## 2017-01-11 LAB — CBC
HEMATOCRIT: 31.2 % — AB (ref 39.0–52.0)
HEMOGLOBIN: 11.1 g/dL — AB (ref 13.0–17.0)
MCH: 31.5 pg (ref 26.0–34.0)
MCHC: 35.6 g/dL (ref 30.0–36.0)
MCV: 88.6 fL (ref 78.0–100.0)
Platelets: 159 10*3/uL (ref 150–400)
RBC: 3.52 MIL/uL — AB (ref 4.22–5.81)
RDW: 13.1 % (ref 11.5–15.5)
WBC: 10.2 10*3/uL (ref 4.0–10.5)

## 2017-01-11 LAB — BASIC METABOLIC PANEL
Anion gap: 7 (ref 5–15)
BUN: 18 mg/dL (ref 6–20)
CHLORIDE: 110 mmol/L (ref 101–111)
CO2: 24 mmol/L (ref 22–32)
Calcium: 7.6 mg/dL — ABNORMAL LOW (ref 8.9–10.3)
Creatinine, Ser: 0.76 mg/dL (ref 0.61–1.24)
GFR calc non Af Amer: >60 mL/min (ref >60)
Glucose, Bld: 162 mg/dL — ABNORMAL HIGH (ref 65–99)
POTASSIUM: 3.3 mmol/L — AB (ref 3.5–5.1)
SODIUM: 141 mmol/L (ref 135–145)

## 2017-01-11 LAB — URINE CULTURE: CULTURE: NO GROWTH

## 2017-01-11 LAB — GLUCOSE, CAPILLARY
GLUCOSE-CAPILLARY: 139 mg/dL — AB (ref 65–99)
GLUCOSE-CAPILLARY: 197 mg/dL — AB (ref 65–99)
GLUCOSE-CAPILLARY: 209 mg/dL — AB (ref 65–99)
GLUCOSE-CAPILLARY: 262 mg/dL — AB (ref 65–99)
Glucose-Capillary: 235 mg/dL — ABNORMAL HIGH (ref 65–99)
Glucose-Capillary: 257 mg/dL — ABNORMAL HIGH (ref 65–99)

## 2017-01-11 LAB — MAGNESIUM
Magnesium: 2.3 mg/dL (ref 1.7–2.4)
Magnesium: 2.3 mg/dL (ref 1.7–2.4)

## 2017-01-11 LAB — PHOSPHORUS
PHOSPHORUS: 2.5 mg/dL (ref 2.5–4.6)
Phosphorus: 2.6 mg/dL (ref 2.5–4.6)

## 2017-01-11 LAB — HIV ANTIBODY (ROUTINE TESTING W REFLEX): HIV Screen 4th Generation wRfx: NONREACTIVE

## 2017-01-11 MED ORDER — SODIUM CHLORIDE 0.9 % IV SOLN
30.0000 meq | Freq: Once | INTRAVENOUS | Status: AC
  Administered 2017-01-11: 30 meq via INTRAVENOUS
  Filled 2017-01-11: qty 15

## 2017-01-11 MED ORDER — PRO-STAT SUGAR FREE PO LIQD
30.0000 mL | Freq: Two times a day (BID) | ORAL | Status: DC
  Administered 2017-01-11 – 2017-02-01 (×42): 30 mL
  Filled 2017-01-11 (×41): qty 30

## 2017-01-11 MED ORDER — VITAL HIGH PROTEIN PO LIQD: 1000.0000 mL | ORAL | Status: DC

## 2017-01-11 MED ORDER — ADULT MULTIVITAMIN LIQUID CH
15.0000 mL | Freq: Every day | ORAL | Status: DC
  Administered 2017-01-11 – 2017-02-01 (×21): 15 mL
  Filled 2017-01-11 (×22): qty 15

## 2017-01-11 MED ORDER — SELENIUM 50 MCG PO TABS
200.0000 ug | ORAL_TABLET | Freq: Every day | ORAL | Status: AC
  Administered 2017-01-11 – 2017-01-17 (×7): 200 ug
  Filled 2017-01-11 (×7): qty 4

## 2017-01-11 MED ORDER — INSULIN GLARGINE 100 UNIT/ML ~~LOC~~ SOLN
10.0000 [IU] | Freq: Every day | SUBCUTANEOUS | Status: DC
  Administered 2017-01-11: 10 [IU] via SUBCUTANEOUS
  Filled 2017-01-11 (×2): qty 0.1

## 2017-01-11 MED ORDER — GLUCERNA 1.2 CAL PO LIQD
1000.0000 mL | ORAL | Status: DC
  Administered 2017-01-11 – 2017-01-18 (×7): 1000 mL
  Filled 2017-01-11 (×13): qty 1000

## 2017-01-11 MED ORDER — PANTOPRAZOLE SODIUM 40 MG IV SOLR
40.0000 mg | Freq: Every day | INTRAVENOUS | Status: DC
Start: 2017-01-11 — End: 2017-01-12
  Administered 2017-01-11: 40 mg via INTRAVENOUS
  Filled 2017-01-11: qty 40

## 2017-01-11 MED ORDER — VITAMIN C 500 MG PO TABS
1000.0000 mg | ORAL_TABLET | Freq: Three times a day (TID) | ORAL | Status: AC
  Administered 2017-01-11 – 2017-01-17 (×21): 1000 mg
  Filled 2017-01-11 (×21): qty 2

## 2017-01-11 NOTE — Procedures (Signed)
Right frontal region shaved and prepped. Draped in usual sterile manor. Small stab incision made in midpupillary line right behind the hair line. Small twist drill burr hole placed in right frontal region. Bolt inserted through twist drill hole. Dural opening checked. The intracranial pressure monitor zeroed to atmospheric pressure, secured in right frontal region and in correct position with ICP of 12. Sterile dressing applied.   I was present and assisted with entire procedure by Dr. Marikay Alaravid Jones.

## 2017-01-11 NOTE — Progress Notes (Signed)
At bedside to place PICC and sister to sign consent , however sister refused to sign consent unless we give her a copy of the consent before we place it. I explained that she was welcome to go to medical records and sign a release form and the would give her a copy but we were not allowed to do so at the bedside. Therefore she stated she would  not sign.

## 2017-01-11 NOTE — Progress Notes (Signed)
Follow up - Trauma Critical Care  Patient Details:    [name] is an 51 y.o. male.  Lines/tubes : Airway 8 mm (Active)  Secured at (cm) 22 cm 01/11/2017  7:50 AM  Measured From Lips 01/11/2017  7:50 AM  Secured Location Center 01/11/2017  7:50 AM  Secured By [employer] 01/11/2017  7:50 AM  Tube Holder Repositioned Yes 01/11/2017  7:50 AM  Cuff Pressure (cm H2O) 24 cm H2O 01/11/2017  7:50 AM  Site Condition Dry 01/11/2017  7:50 AM     NG/OG Tube Orogastric 16 Fr. Center mouth (Active)  Site Assessment Clean;Dry;Intact 01/10/2017  8:00 PM  Ongoing Placement Verification Xray 01/10/2017  8:00 PM  Status Suction-low intermittent 01/10/2017  8:00 PM    Microbiology/Sepsis markers: Results for orders placed or performed during the hospital encounter of 01/10/17  MRSA PCR Screening     Status: None   Collection Time: 01/10/17  3:21 PM  Result Value Ref Range Status   MRSA by PCR NEGATIVE NEGATIVE Final    Comment:        The GeneXpert MRSA Assay (FDA approved for NASAL specimens only), is one component of a comprehensive MRSA colonization surveillance program. It is not intended to diagnose MRSA infection nor to guide or monitor treatment for MRSA infections.     Anti-infectives:  Anti-infectives    Start     Dose/Rate Route Frequency Ordered Stop   01/10/17 1600  metroNIDAZOLE (FLAGYL) IVPB 500 mg     500 mg 100 mL/hr over 60 Minutes Intravenous  Once 01/10/17 1518 01/10/17 1629   01/10/17 1045  cefTRIAXone (ROCEPHIN) 1 g in dextrose 5 % 50 mL IVPB     1 g 100 mL/hr over 30 Minutes Intravenous  Once 01/10/17 1035 01/10/17 1248   01/10/17 1045  azithromycin (ZITHROMAX) 500 mg in dextrose 5 % 250 mL IVPB     500 mg 250 mL/hr over 60 Minutes Intravenous  Once 01/10/17 1035 01/10/17 1353   01/10/17 1045  metroNIDAZOLE (FLAGYL) IVPB 500 mg  Status:  Discontinued     500 mg 100 mL/hr over 60 Minutes Intravenous  Once 01/10/17 1035 01/10/17 1517       Best Practice/Protocols:  VTE Prophylaxis: Mechanical Continous Sedation  Consults:     Studies:    Events:  Subjective:    Overnight Issues:   Objective:  Vital signs for last 24 hours: Temp:  [98.5 F (36.9 C)-101 F (38.3 C)] 98.5 F (36.9 C) (03/12 0400) Pulse Rate:  [71-129] 71 (03/12 0600) Resp:  [16-34] 20 (03/12 0600) BP: (105-164)/(60-139) 114/68 (03/12 0600) SpO2:  [92 %-100 %] 99 % (03/12 0750) FiO2 (%):  [30 %-100 %] 30 % (03/12 0750) Weight:  [67.3 kg (148 lb 5.9 oz)-77.1 kg (170 lb)] 67.3 kg (148 lb 5.9 oz) (03/11 1545)  Hemodynamic parameters for last 24 hours:    Intake/Output from previous day: 03/11 0701 - 03/12 0700 In: 4070.1 [I.V.:1705.1; IV Piggyback:2365] Out: 3495 [Urine:3495]  Intake/Output this shift: No intake/output data recorded.  Vent settings for last 24 hours: Vent Mode: PSV FiO2 (%):  [30 %-100 %] 30 % Set Rate:  [15 bmp] 15 bmp Vt Set:  [400 mL] 400 mL PEEP:  [5 cmH20] 5 cmH20 Pressure Support:  [5 cmH20] 5 cmH20 Plateau Pressure:  [11 cmH20-15 cmH20] 15 cmH20  Physical Exam:  General: on vent Neuro: PERL, grimace to pain, sedation just held HEENT/Neck: ETT Resp: clear to auscultation bilaterally CVS: RRR GI:  soft, NT, ND Extremities: claves soft  Results for orders placed or performed during the hospital encounter of 01/10/17 (from the past 24 hour(s))  Comprehensive metabolic panel     Status: Abnormal   Collection Time: 01/10/17 10:22 AM  Result Value Ref Range   Sodium 141 135 - 145 mmol/L   Potassium 4.5 3.5 - 5.1 mmol/L   Chloride 101 101 - 111 mmol/L   CO2 22 22 - 32 mmol/L   Glucose, Bld 456 (H) 65 - 99 mg/dL   BUN 18 6 - 20 mg/dL   Creatinine, Ser 1.61 0.61 - 1.24 mg/dL   Calcium 9.2 8.9 - 09.6 mg/dL   Total Protein 7.6 6.5 - 8.1 g/dL   Albumin 4.2 3.5 - 5.0 g/dL   AST 37 15 - 41 U/L   ALT 24 17 - 63 U/L   Alkaline Phosphatase 45 38 - 126 U/L   Total Bilirubin 0.9 0.3 - 1.2 mg/dL   GFR calc  non Af Amer >60 >60 mL/min   GFR calc Af Amer >60 >60 mL/min   Anion gap 18 (H) 5 - 15  Ethanol     Status: Abnormal   Collection Time: 01/10/17 10:22 AM  Result Value Ref Range   Alcohol, Ethyl (B) 6 (H) <5 mg/dL  Acetaminophen level     Status: Abnormal   Collection Time: 01/10/17 10:22 AM  Result Value Ref Range   Acetaminophen (Tylenol), Serum <10 (L) 10 - 30 ug/mL  Salicylate level     Status: None   Collection Time: 01/10/17 10:22 AM  Result Value Ref Range   Salicylate Lvl <7.0 2.8 - 30.0 mg/dL  CBC with Differential     Status: Abnormal   Collection Time: 01/10/17 10:22 AM  Result Value Ref Range   WBC 15.2 (H) 4.0 - 10.5 K/uL   RBC 4.68 4.22 - 5.81 MIL/uL   Hemoglobin 14.5 13.0 - 17.0 g/dL   HCT 04.5 40.9 - 81.1 %   MCV 86.8 78.0 - 100.0 fL   MCH 31.0 26.0 - 34.0 pg   MCHC 35.7 30.0 - 36.0 g/dL   RDW 91.4 78.2 - 95.6 %   Platelets 212 150 - 400 K/uL   Neutrophils Relative % 92 %   Neutro Abs 14.1 (H) 1.7 - 7.7 K/uL   Lymphocytes Relative 4 %   Lymphs Abs 0.6 (L) 0.7 - 4.0 K/uL   Monocytes Relative 4 %   Monocytes Absolute 0.5 0.1 - 1.0 K/uL   Eosinophils Relative 0 %   Eosinophils Absolute 0.0 0.0 - 0.7 K/uL   Basophils Relative 0 %   Basophils Absolute 0.0 0.0 - 0.1 K/uL  Protime-INR     Status: None   Collection Time: 01/10/17 10:22 AM  Result Value Ref Range   Prothrombin Time 13.7 11.4 - 15.2 seconds   INR 1.04   I-stat Chem 8, ED     Status: Abnormal   Collection Time: 01/10/17 10:26 AM  Result Value Ref Range   Sodium 140 135 - 145 mmol/L   Potassium 4.4 3.5 - 5.1 mmol/L   Chloride 102 101 - 111 mmol/L   BUN 23 (H) 6 - 20 mg/dL   Creatinine, Ser 2.13 0.61 - 1.24 mg/dL   Glucose, Bld 086 (H) 65 - 99 mg/dL   Calcium, Ion 5.78 (L) 1.15 - 1.40 mmol/L   TCO2 23 0 - 100 mmol/L   Hemoglobin 13.9 13.0 - 17.0 g/dL   HCT 46.9 62.9 - 52.8 %  I-Stat CG4 Lactic Acid, ED     Status: Abnormal   Collection Time: 01/10/17 10:27 AM  Result Value Ref Range    Lactic Acid, Venous 7.79 (HH) 0.5 - 1.9 mmol/L   Comment NOTIFIED PHYSICIAN   I-Stat venous blood gas, ED     Status: Abnormal   Collection Time: 01/10/17 10:28 AM  Result Value Ref Range   pH, Ven 7.415 7.250 - 7.430   pCO2, Ven 35.2 (L) 44.0 - 60.0 mmHg   pO2, Ven 25.0 (LL) 32.0 - 45.0 mmHg   Bicarbonate 22.5 20.0 - 28.0 mmol/L   TCO2 24 0 - 100 mmol/L   O2 Saturation 48.0 %   Acid-base deficit 2.0 0.0 - 2.0 mmol/L   Patient temperature HIDE    Sample type VENOUS    Comment NOTIFIED PHYSICIAN   Urinalysis, Routine w reflex microscopic     Status: Abnormal   Collection Time: 01/10/17 12:43 PM  Result Value Ref Range   Color, Urine STRAW (A) YELLOW   APPearance CLEAR CLEAR   Specific Gravity, Urine 1.024 1.005 - 1.030   pH 5.0 5.0 - 8.0   Glucose, UA >=500 (A) NEGATIVE mg/dL   Hgb urine dipstick SMALL (A) NEGATIVE   Bilirubin Urine NEGATIVE NEGATIVE   Ketones, ur 20 (A) NEGATIVE mg/dL   Protein, ur NEGATIVE NEGATIVE mg/dL   Nitrite NEGATIVE NEGATIVE   Leukocytes, UA NEGATIVE NEGATIVE   RBC / HPF NONE SEEN 0 - 5 RBC/hpf   WBC, UA 0-5 0 - 5 WBC/hpf   Bacteria, UA NONE SEEN NONE SEEN   Squamous Epithelial / LPF NONE SEEN NONE SEEN  Urine rapid drug screen (hosp performed)     Status: None   Collection Time: 01/10/17 12:43 PM  Result Value Ref Range   Opiates NONE DETECTED NONE DETECTED   Cocaine NONE DETECTED NONE DETECTED   Benzodiazepines NONE DETECTED NONE DETECTED   Amphetamines NONE DETECTED NONE DETECTED   Tetrahydrocannabinol NONE DETECTED NONE DETECTED   Barbiturates NONE DETECTED NONE DETECTED  MRSA PCR Screening     Status: None   Collection Time: 01/10/17  3:21 PM  Result Value Ref Range   MRSA by PCR NEGATIVE NEGATIVE  Glucose, capillary     Status: Abnormal   Collection Time: 01/10/17  3:33 PM  Result Value Ref Range   Glucose-Capillary 367 (H) 65 - 99 mg/dL  Lactic acid, plasma     Status: Abnormal   Collection Time: 01/10/17  3:40 PM  Result Value Ref  Range   Lactic Acid, Venous 2.0 (HH) 0.5 - 1.9 mmol/L  Triglycerides     Status: Abnormal   Collection Time: 01/10/17  7:55 PM  Result Value Ref Range   Triglycerides 213 (H) <150 mg/dL  Glucose, capillary     Status: Abnormal   Collection Time: 01/10/17  8:03 PM  Result Value Ref Range   Glucose-Capillary 240 (H) 65 - 99 mg/dL  Blood gas, arterial     Status: Abnormal   Collection Time: 01/10/17  8:35 PM  Result Value Ref Range   FIO2 60.00    Delivery systems VENTILATOR    Mode PRESSURE REGULATED VOLUME CONTROL    VT 400 mL   LHR 15 resp/min   Peep/cpap 5.0 cm H20   pH, Arterial 7.418 7.350 - 7.450   pCO2 arterial 35.9 32.0 - 48.0 mmHg   pO2, Arterial 163 (H) 83.0 - 108.0 mmHg   Bicarbonate 22.8 20.0 - 28.0 mmol/L   Acid-base deficit 1.1  0.0 - 2.0 mmol/L   O2 Saturation 99.1 %   Patient temperature 98.6    Collection site RIGHT BRACHIAL    Drawn by [phone]    Sample type ARTERIAL DRAW    Allens test (pass/fail) PASS PASS  Glucose, capillary     Status: Abnormal   Collection Time: 01/10/17 11:08 PM  Result Value Ref Range   Glucose-Capillary 257 (H) 65 - 99 mg/dL  CBC     Status: Abnormal   Collection Time: 01/11/17  2:27 AM  Result Value Ref Range   WBC 10.2 4.0 - 10.5 K/uL   RBC 3.52 (L) 4.22 - 5.81 MIL/uL   Hemoglobin 11.1 (L) 13.0 - 17.0 g/dL   HCT 19.1 (L) 47.8 - 29.5 %   MCV 88.6 78.0 - 100.0 fL   MCH 31.5 26.0 - 34.0 pg   MCHC 35.6 30.0 - 36.0 g/dL   RDW 62.1 30.8 - 65.7 %   Platelets 159 150 - 400 K/uL  Basic metabolic panel     Status: Abnormal   Collection Time: 01/11/17  2:27 AM  Result Value Ref Range   Sodium 141 135 - 145 mmol/L   Potassium 3.3 (L) 3.5 - 5.1 mmol/L   Chloride 110 101 - 111 mmol/L   CO2 24 22 - 32 mmol/L   Glucose, Bld 162 (H) 65 - 99 mg/dL   BUN 18 6 - 20 mg/dL   Creatinine, Ser 8.46 0.61 - 1.24 mg/dL   Calcium 7.6 (L) 8.9 - 10.3 mg/dL   GFR calc non Af Amer >60 >60 mL/min   GFR calc Af Amer >60 >60 mL/min   Anion gap 7 5 - 15   Glucose, capillary     Status: Abnormal   Collection Time: 01/11/17  3:20 AM  Result Value Ref Range   Glucose-Capillary 139 (H) 65 - 99 mg/dL    Assessment & Plan: Present on Admission: . Fall . Subdural hemorrhage (HCC)    LOS: 1 day   Additional comments:I reviewed the patient's new clinical lab test results. and CT Fall TBI/L SDH/B ICC/IVH - F/U CT done this AM, per Dr. Clyde Canterbury dependent resp failure - weaning for now, will not extubate DM/Hyperglycemia - improved on SSI FEN - hypokalemia - replace, PICC ordered, start TF VTE - PAS DIspo - ICU I spoke with his sister at the bedside  Critical Care Total Time*: 67 Minutes  [name], MD, MPH, FACS Trauma: [id number] General Surgery: [phone]  01/11/2017  *Care during the described time interval was provided by me. I have reviewed this patient's available data, including medical history, events of note, physical examination and test results as part of my evaluation.  Patient ID: [name], male   DOB: 09/24/1966, 51 y.o.   MRN: [id number]

## 2017-01-11 NOTE — Progress Notes (Addendum)
Subjective: Patient intubated and sedated.  Objective: Vital signs in last 24 hours: Temp:  [98.5 F (36.9 C)-101 F (38.3 C)] 98.5 F (36.9 C) (03/12 0400) Pulse Rate:  [71-129] 71 (03/12 0600) Resp:  [16-34] 20 (03/12 0600) BP: (105-164)/(60-139) 114/68 (03/12 0600) SpO2:  [92 %-100 %] 99 % (03/12 0750) FiO2 (%):  [30 %-100 %] 30 % (03/12 0750) Weight:  [67.3 kg (148 lb 5.9 oz)-77.1 kg (170 lb)] 67.3 kg (148 lb 5.9 oz) (03/11 1545)  Intake/Output from previous day: 03/11 0701 - 03/12 0700 In: 4070.1 [I.V.:1705.1; IV Piggyback:2365] Out: 3495 [Urine:3495] Intake/Output this shift: No intake/output data recorded.  Neurologic: Mental Status: obtunded, cannot test speech or attention span, knowledge, or memory Motor exam: no response to painful stimulus in all four extremities Sensory: unable to examine Coordination: unable to examine Gait: unable to examine Balance: unable to examine  Cranial Nerves: I: smell Not tested  II: visual acuity  OS: na    OD: na  II: visual fields   II: pupils Equal and reactive, sluggish  III,VII: ptosis   III,IV,VI: extraocular muscles    V: mastication   V: facial light touch sensation    V,VII: corneal reflex  present  VII: facial muscle function - upper    VII: facial muscle function - lower   VIII: hearing   IX: soft palate elevation    IX,X: gag reflex   XI: trapezius strength    XI: sternocleidomastoid strength   XI: neck flexion strength    XII: tongue strength       Lab Results: Lab Results  Component Value Date   WBC 10.2 01/11/2017   HGB 11.1 (L) 01/11/2017   HCT 31.2 (L) 01/11/2017   MCV 88.6 01/11/2017   PLT 159 01/11/2017   Lab Results  Component Value Date   INR 1.04 01/10/2017   BMET Lab Results  Component Value Date   NA 141 01/11/2017   K 3.3 (L) 01/11/2017   CL 110 01/11/2017   CO2 24 01/11/2017   GLUCOSE 162 (H) 01/11/2017   BUN 18 01/11/2017   CREATININE 0.76 01/11/2017   CALCIUM  7.6 (L) 01/11/2017    Studies/Results: Ct Head Wo Contrast  Result Date: 01/11/2017 CLINICAL DATA:  Followup intracranial hemorrhage. EXAM: CT HEAD WITHOUT CONTRAST TECHNIQUE: Contiguous axial images were obtained from the base of the skull through the vertex without intravenous contrast. COMPARISON:  Prior CT from 01/10/2017. FINDINGS: Brain: Right holo hemispheric subdural hematoma again seen, measuring up to 4 mm in maximal thickness. Slight extension along the falx. Superimposed extensive hemorrhagic contusions within the subjacent right frontal and temporal lobes, overall relatively similar in size. Associated vasogenic edema slightly increased. Scattered subarachnoid hemorrhage involving the bilateral cerebral hemispheres relatively similar. Few scattered peripheral hemorrhagic contusions within the left temporal lobe relatively stable in size as well the with increased edema. Mass effect within the right cerebral hemisphere with right-to-left midline shift, measuring 8 mm on today's study. Right lateral ventricle partially effaced. Mild asymmetric dilatation of the left lateral ventricle, relatively similar. Small volume intraventricular hemorrhage, likely related to redistribution, also similar. No hydrocephalus. Mild basilar cistern crowding is stable. No new intracranial process.  No appreciable infarct. Vascular: No hyperdense vessel. Skull: Slightly reduced scalp contusion on the left. Left-sided calvarial fracture again noted with blood/fluid within the left middle ear cavity and left mastoid air cells. Sinuses/Orbits: Globes and orbital soft tissues within normal limits. Scattered mucosal thickening within the paranasal sinuses. IMPRESSION: Traumatic brain injury.  Left subdural hematoma similar measuring up to 4 mm, with interval increase in vasogenic edema about multiple bilateral hemorrhagic contusions. Acute subarachnoid and intraventricular hemorrhage is similar. Slightly increased  right-to-left shift now measuring 8 mm. Electronically Signed   By: Rise MuBenjamin  McClintock M.D.   On: 01/11/2017 04:47   Ct Head Wo Contrast  Result Date: 01/10/2017 CLINICAL DATA:  51 year old male with fall and altered mental status. Initial encounter. EXAM: CT HEAD WITHOUT CONTRAST CT CERVICAL SPINE WITHOUT CONTRAST TECHNIQUE: Multidetector CT imaging of the head and cervical spine was performed following the standard protocol without intravenous contrast. Multiplanar CT image reconstructions of the cervical spine were also generated. COMPARISON:  None. FINDINGS: CT HEAD FINDINGS Brain: Scattered areas of intraparenchymal hemorrhage bilaterally are noted, with the largest area measuring 4.5 cm the right frontal region. Smaller areas of hemorrhage are noted within the right temporal, right frontal, left temporal and left parietal lobes. A right subdural hematoma is noted measuring 6.5 mm in greatest diameter. Scattered areas of subarachnoid hemorrhage are identified, greatest in the high right frontal region. There is a 7 mm right to left midline shift. A small amount of intraventricular hemorrhage in the left occipital forearm identified. Vascular: No hyperdense vessels identified. Skull: A nondisplaced vertical fracture through the left mastoid air cells/left temporal bone noted with left mastoid effusion. The left middle ear is clear. Sinuses/Orbits: No acute finding. Other: Left scalp soft tissue swelling is noted. CT CERVICAL SPINE FINDINGS Alignment: Normal. Skull base and vertebrae: No cervical spine fracture identified. No focal bony lesions present. Soft tissues and spinal canal: No prevertebral fluid or swelling. No visible canal hematoma. Disc levels: Mild degenerative disc disease and spondylosis at C4-5 noted. Upper chest: Negative. Other: A fracture through the left mastoid/temporal bone noted with left mastoid effusion. IMPRESSION: Intracranial hemorrhage with 4.5 cm right frontal intraparenchymal  hemorrhage, 6 mm right subdural hematoma and other scattered areas of intraparenchymal and subarachnoid hemorrhage as described. 7 mm right to left midline shift. Nondisplaced vertical fracture through the left temporal bone. Consider high-resolution CT as clinically indicated. No static evidence of acute injury to the cervical spine. Critical Value/emergent results were called by telephone at the time of interpretation on 01/10/2017 at 11:45 am to Dr. Tilden FossaELIZABETH REES , who verbally acknowledged these results. Electronically Signed   By: Harmon PierJeffrey  Hu M.D.   On: 01/10/2017 11:50   Ct Chest W Contrast  Result Date: 01/10/2017 CLINICAL DATA:  51 year old male with fall and unresponsive. Traumatic head injury. EXAM: CT CHEST, ABDOMEN, AND PELVIS WITH CONTRAST TECHNIQUE: Multidetector CT imaging of the chest, abdomen and pelvis was performed following the standard protocol during bolus administration of intravenous contrast. CONTRAST:  100mL ISOVUE-300 IOPAMIDOL (ISOVUE-300) INJECTION 61% COMPARISON:  None. FINDINGS: CT CHEST FINDINGS Cardiovascular: No significant vascular findings. Normal heart size. No pericardial effusion. There is no evidence of thoracic aortic aneurysm. Mediastinum/Nodes: No enlarged mediastinal, hilar, or axillary lymph nodes. Thyroid gland, trachea, and esophagus demonstrate no significant findings. Lungs/Pleura: Mild central and bilateral lower lung ground-glass opacities are nonspecific and may represent edema, aspiration or infection. There is no evidence of consolidation, pleural effusion, pulmonary mass for pneumothorax. Musculoskeletal: No chest wall mass or suspicious bone lesions identified. CT ABDOMEN PELVIS FINDINGS Hepatobiliary: Mild hepatic steatosis noted. The liver and gallbladder are otherwise unremarkable. No biliary dilatation. Pancreas: Unremarkable Spleen: Unremarkable Adrenals/Urinary Tract: The bladder is markedly distended. There is moderate right and mild left  hydroureteronephrosis which may be related to the distended bladder is no  obstructing cause is identified. No solid renal mass or urinary calculi identified. The adrenal glands are unremarkable. Stomach/Bowel: Stomach is within normal limits. Appendix appears normal. No evidence of bowel wall thickening, distention, or inflammatory changes. Vascular/Lymphatic: Aortic atherosclerotic calcifications noted without aneurysm. No enlarged lymph nodes identified. Reproductive: Mild prostate enlargement noted. Other: No free fluid, focal collection or pneumoperitoneum. Musculoskeletal: No acute or suspicious abnormalities. Moderate degenerative disc disease and spondylosis at L5-S1 noted. IMPRESSION: No evidence of acute injury within the chest, abdomen or pelvis. Mild central bilateral lower lung ground-glass opacities which are nonspecific but may represent edema or infection. Aspiration is a consideration. Markedly distended bladder with moderate right and mild left hydroureteronephrosis. Mild hepatic steatosis and mild prostate enlargement. Abdominal aortic atherosclerosis. Electronically Signed   By: Harmon Pier M.D.   On: 01/10/2017 12:38   Ct Cervical Spine Wo Contrast  Result Date: 01/10/2017 CLINICAL DATA:  51 year old male with fall and altered mental status. Initial encounter. EXAM: CT HEAD WITHOUT CONTRAST CT CERVICAL SPINE WITHOUT CONTRAST TECHNIQUE: Multidetector CT imaging of the head and cervical spine was performed following the standard protocol without intravenous contrast. Multiplanar CT image reconstructions of the cervical spine were also generated. COMPARISON:  None. FINDINGS: CT HEAD FINDINGS Brain: Scattered areas of intraparenchymal hemorrhage bilaterally are noted, with the largest area measuring 4.5 cm the right frontal region. Smaller areas of hemorrhage are noted within the right temporal, right frontal, left temporal and left parietal lobes. A right subdural hematoma is noted measuring 6.5  mm in greatest diameter. Scattered areas of subarachnoid hemorrhage are identified, greatest in the high right frontal region. There is a 7 mm right to left midline shift. A small amount of intraventricular hemorrhage in the left occipital forearm identified. Vascular: No hyperdense vessels identified. Skull: A nondisplaced vertical fracture through the left mastoid air cells/left temporal bone noted with left mastoid effusion. The left middle ear is clear. Sinuses/Orbits: No acute finding. Other: Left scalp soft tissue swelling is noted. CT CERVICAL SPINE FINDINGS Alignment: Normal. Skull base and vertebrae: No cervical spine fracture identified. No focal bony lesions present. Soft tissues and spinal canal: No prevertebral fluid or swelling. No visible canal hematoma. Disc levels: Mild degenerative disc disease and spondylosis at C4-5 noted. Upper chest: Negative. Other: A fracture through the left mastoid/temporal bone noted with left mastoid effusion. IMPRESSION: Intracranial hemorrhage with 4.5 cm right frontal intraparenchymal hemorrhage, 6 mm right subdural hematoma and other scattered areas of intraparenchymal and subarachnoid hemorrhage as described. 7 mm right to left midline shift. Nondisplaced vertical fracture through the left temporal bone. Consider high-resolution CT as clinically indicated. No static evidence of acute injury to the cervical spine. Critical Value/emergent results were called by telephone at the time of interpretation on 01/10/2017 at 11:45 am to Dr. Tilden Fossa , who verbally acknowledged these results. Electronically Signed   By: Harmon Pier M.D.   On: 01/10/2017 11:50   Ct Abdomen Pelvis W Contrast  Result Date: 01/10/2017 CLINICAL DATA:  51 year old male with fall and unresponsive. Traumatic head injury. EXAM: CT CHEST, ABDOMEN, AND PELVIS WITH CONTRAST TECHNIQUE: Multidetector CT imaging of the chest, abdomen and pelvis was performed following the standard protocol during  bolus administration of intravenous contrast. CONTRAST:  ISOVUE-300 IOPAMIDOL (ISOVUE-300) INJECTION 61% COMPARISON:  None. FINDINGS: CT CHEST FINDINGS Cardiovascular: No significant vascular findings. Normal heart size. No pericardial effusion. There is no evidence of thoracic aortic aneurysm. Mediastinum/Nodes: No enlarged mediastinal, hilar, or axillary lymph nodes. Thyroid gland, trachea,  and esophagus demonstrate no significant findings. Lungs/Pleura: Mild central and bilateral lower lung ground-glass opacities are nonspecific and may represent edema, aspiration or infection. There is no evidence of consolidation, pleural effusion, pulmonary mass for pneumothorax. Musculoskeletal: No chest wall mass or suspicious bone lesions identified. CT ABDOMEN PELVIS FINDINGS Hepatobiliary: Mild hepatic steatosis noted. The liver and gallbladder are otherwise unremarkable. No biliary dilatation. Pancreas: Unremarkable Spleen: Unremarkable Adrenals/Urinary Tract: The bladder is markedly distended. There is moderate right and mild left hydroureteronephrosis which may be related to the distended bladder is no obstructing cause is identified. No solid renal mass or urinary calculi identified. The adrenal glands are unremarkable. Stomach/Bowel: Stomach is within normal limits. Appendix appears normal. No evidence of bowel wall thickening, distention, or inflammatory changes. Vascular/Lymphatic: Aortic atherosclerotic calcifications noted without aneurysm. No enlarged lymph nodes identified. Reproductive: Mild prostate enlargement noted. Other: No free fluid, focal collection or pneumoperitoneum. Musculoskeletal: No acute or suspicious abnormalities. Moderate degenerative disc disease and spondylosis at L5-S1 noted. IMPRESSION: No evidence of acute injury within the chest, abdomen or pelvis. Mild central bilateral lower lung ground-glass opacities which are nonspecific but may represent edema or infection. Aspiration is a  consideration. Markedly distended bladder with moderate right and mild left hydroureteronephrosis. Mild hepatic steatosis and mild prostate enlargement. Abdominal aortic atherosclerosis. Electronically Signed   By: Harmon Pier M.D.   On: 01/10/2017 12:38   Dg Chest Port 1 View  Result Date: 01/10/2017 CLINICAL DATA:  51 year old male with fall and unresponsiveness and traumatic head injury. Status post intubation EXAM: PORTABLE CHEST 1 VIEW COMPARISON:  Chest CT dated 10/01/2017 FINDINGS: An endotracheal tube is noted with tip approximately 4.5 cm above the carina. And enteric tube extends into the abdomen with side-port in the epigastric area over the L1 vertebra and the tip beyond the inferior margin of the image, likely in the distal stomach. Bibasilar streaky densities more pronounced on the left as seen on the prior CT may represent atelectasis/infiltrate. No focal consolidation, pleural effusion, or pneumothorax. The cardiac silhouette is within normal limits. No acute osseous pathology. IMPRESSION: 1. Endotracheal tube above the carina and enteric tube with tip likely in the mid to distal stomach. 2. Bibasilar linear densities, likely atelectasis versus infiltrate. Electronically Signed   By: Elgie Collard M.D.   On: 01/10/2017 20:26   Dg Chest Port 1 View  Result Date: 01/10/2017 CLINICAL DATA:  51 year old male with altered mental status and fever. EXAM: PORTABLE CHEST 1 VIEW COMPARISON:  None. FINDINGS: The cardiomediastinal silhouette is unremarkable. There is no evidence of focal airspace disease, pulmonary edema, suspicious pulmonary nodule/mass, pleural effusion, or pneumothorax. No acute bony abnormalities are identified. IMPRESSION: No active disease. Electronically Signed   By: Harmon Pier M.D.   On: 01/10/2017 10:30   Ct Maxillofacial Wo Cm  Result Date: 01/10/2017 CLINICAL DATA:  51 year old male status post fall with altered mental status. Hemorrhagic cerebral contusions with  subarachnoid hemorrhage and subdural hematoma on the head CT earlier today. EXAM: CT MAXILLOFACIAL WITHOUT CONTRAST TECHNIQUE: Multidetector CT imaging of the maxillofacial structures was performed. Multiplanar CT image reconstructions were also generated. A small metallic BB was placed on the right temple in order to reliably differentiate right from left. COMPARISON:  Head and cervical spine CT 1009 hours today FINDINGS: Motion artifact on the initial axial images. Osseous: Mandible intact. Poor dentition right mandible bicuspid. No maxilla fracture. No zygoma fracture. No definite acute nasal bone fracture. Pterygoids are intact. The central skullbase appears intact. There is  a nondepressed horizontally oriented left lateral skull fracture through the squamousal portion of the temporal bone best seen on coronal series 13, image 47. On sagittal images (series 14, image 73) there appears to be a second parallel nondisplaced fracture which tracks from the the lateral left temporal bone anteriorly to the greater wing of the sphenoid (series 14, image 62) and perhaps into the lateral wall of the left orbit (image 58). This orbital component also appears to beyond series 11, image 13. There is no associated orbital soft tissue abnormality. There is a superimposed nondisplaced vertically-oriented fracture through the left mastoid air cells. There is fluid/blood throughout the mastoids and also in the left inferior temp panic cavity (hypo 10 planum) and throughout the left EAC. The left ossicles appear to remain normally aligned. Orbits: Aside from the suggestion of a nondisplaced fracture of the left skull which travels into the lateral wall of the left orbit (detailed above), the orbital walls appear intact. Bilateral orbits soft tissues appear normal. Sinuses: Left mastoids and middle ear described above. Mild paranasal sinus mucosal thickening. Right tympanic cavity and mastoids are clear. Soft tissues: Negative  visualized noncontrast larynx, pharynx (retained secretions in the hypopharynx), parapharyngeal spaces, retropharyngeal space, sublingual space, submandibular glands and parotid glands. The masticator spaces appear normal. Limited intracranial: Partially visible extensive right frontal and temporal hemorrhagic contusions plus right side subdural hematoma. Smaller posterior left temporal lobe hemorrhagic contusion also re- demonstrated. IMPRESSION: 1. Parallel nondisplaced horizontally oriented skull fractures along the left lateral convexity (sagittal series 14, image 73) tracking anteriorly to the greater wing of the left sphenoid. There may be extension into the left lateral wall of the orbit, but the orbits otherwise appear normal. 2. Vertically-oriented fracture through the left mastoid bone and air cells with fluid/blood in the left middle ear, mastoids, and EAC. The left ossicles appear intact. 3. No facial fracture identified. 4. Partially visible extensive intracranial hemorrhage as seen on the head CT today. Electronically Signed   By: Odessa Fleming M.D.   On: 01/10/2017 12:58    Assessment/Plan: Patient is intubated. WUA after one hour revealed no response to pain in his extremities, just facial grimacing. Slight increase in shift on CT scan from this morning, to be expected over the next couple days. Will continue to monitor.    LOS: 1 day    Tiana Loft Meyran 01/11/2017, 8:28 AM   Pt seen and examined. I think placement of an ICP monitor is now reasonable. CT reviewed - large contusions, but nothing requiring decompressive surgery yet, cisterns open. Marikay Alar, MD

## 2017-01-11 NOTE — Progress Notes (Addendum)
  Inpatient Diabetes Program Recommendations  AACE/ADA: New Consensus Statement on Inpatient Glycemic Control (2015)  Target Ranges:  Prepandial:   less than 140 mg/dL      Peak postprandial:   less than 180 mg/dL (1-2 hours)      Critically ill patients:  140 - 180 mg/dL   Results for [name], [name] (MRN [id number]) as of 01/11/2017 10:25  Ref. Range 01/10/2017 15:33 01/10/2017 20:03 01/10/2017 23:08 01/11/2017 03:20 01/11/2017 08:16  Glucose-Capillary Latest Ref Range: 65 - 99 mg/dL 130367 (H) 865240 (H) 784257 (H) 139 (H) 209 (H)    Admit with: Head Trauma  History: DM2  Home DM Meds: Glipizide 5 mg daily       Jentadueto 2.03/999 mg BID  Current Insulin Orders: Novolog Moderate Correction Scale/ SSI (0-15 units) Q4 hours      MD- Please consider initiating ICU Glycemic Control Protocol- Phase 1  Novolog 2-4-6 Q4 hours (standard SSI)  Addendum 1220pm- Spoke with Dr. Janee Mornhompson by phone.  Discussed CBGs.  Decision made to initiate Lantus 10 units daily.  Order placed.  If CBGs remain elevated, could still consider starting ICU Glycemic Control Protocol tomorrow     --Will follow patient during hospitalization--  Ambrose FinlandJeannine Johnston Oniya Mandarino RN, MSN, CDE Diabetes Coordinator Inpatient Glycemic Control Team Team Pager: [phone] (8a-5p)

## 2017-01-11 NOTE — Progress Notes (Signed)
Initial Nutrition Assessment  DOCUMENTATION CODES:   Not applicable  INTERVENTION:   Glucerna 1.2 @ 40 ml/hr (960 ml/day) 30 ml Prostat BID MVI daily Provides: 1552 kcal, 117 grams protein, and 772 ml free water.  TF regimen and propofol at current rate providing 1818 total kcal/day   NUTRITION DIAGNOSIS:   Increased nutrient needs related to  (TBI) as evidenced by estimated needs.  GOAL:   Patient will meet greater than or equal to 90% of their needs  MONITOR:   TF tolerance, Vent status, I & O's, Labs  REASON FOR ASSESSMENT:   Consult Enteral/tube feeding initiation and management  ASSESSMENT:   Pt admitted after a fall while intoxicated with TBI, L SDH, B ICC, IVH. Pt with hyperglycemia on admission with hx of DM.    Patient is currently intubated on ventilator support MV: 8.8 L/min Temp (24hrs), Avg:99.8 F (37.7 C), Min:98.5 F (36.9 C), Max:101 F (38.3 C)  Propofol: 10.1 ml/hr provides: 266 kcal  Medications reviewed and include: selenium, vitamin C CBG's: 209-235 Per family no recent weight changes. Good appetite PTA but not eating healthy. Works as a Administratorlandscaper.   Diet Order:  Diet NPO time specified  Skin:  Reviewed, no issues  Last BM:  3/11  Height:   Ht Readings from Last 1 Encounters:  01/10/17 5\' 1"  (1.549 m)    Weight:   Wt Readings from Last 1 Encounters:  01/10/17 148 lb 5.9 oz (67.3 kg)    Ideal Body Weight:  59 kg  BMI:  Body mass index is 28.03 kg/m.  Estimated Nutritional Needs:   Kcal:  1792  Protein:  100-120 grams  Fluid:  > 1.8 L/day  EDUCATION NEEDS:   No education needs identified at this time  Kendell BaneHeather Mauri Tolen RD, LDN, CNSC [phone] Pager [phone] After Hours Pager

## 2017-01-11 NOTE — Progress Notes (Signed)
Interpreter Wyvonnia DuskyGraciela Namihira for CSW and Florentina AddisonKatie

## 2017-01-12 ENCOUNTER — Inpatient Hospital Stay (HOSPITAL_COMMUNITY): Payer: Medicaid Other

## 2017-01-12 LAB — BLOOD GAS, ARTERIAL
ACID-BASE EXCESS: 1.3 mmol/L (ref 0.0–2.0)
BICARBONATE: 26 mmol/L (ref 20.0–28.0)
Drawn by: 40415
FIO2: 30
LHR: 15 {breaths}/min
MECHVT: 450 mL
O2 Saturation: 97.5 %
PEEP/CPAP: 5 cmH2O
PO2 ART: 96.5 mmHg (ref 83.0–108.0)
Patient temperature: 98.5
pCO2 arterial: 46.1 mmHg (ref 32.0–48.0)
pH, Arterial: 7.37 (ref 7.350–7.450)

## 2017-01-12 LAB — MAGNESIUM
Magnesium: 2.4 mg/dL (ref 1.7–2.4)
Magnesium: 2.1 mg/dL (ref 1.7–2.4)

## 2017-01-12 LAB — BASIC METABOLIC PANEL
ANION GAP: 3 — AB (ref 5–15)
BUN: 14 mg/dL (ref 6–20)
CALCIUM: 7.7 mg/dL — AB (ref 8.9–10.3)
CHLORIDE: 113 mmol/L — AB (ref 101–111)
CO2: 26 mmol/L (ref 22–32)
Creatinine, Ser: 0.54 mg/dL — ABNORMAL LOW (ref 0.61–1.24)
GFR calc non Af Amer: >60 mL/min (ref >60)
Glucose, Bld: 196 mg/dL — ABNORMAL HIGH (ref 65–99)
POTASSIUM: 4 mmol/L (ref 3.5–5.1)
Sodium: 142 mmol/L (ref 135–145)

## 2017-01-12 LAB — GLUCOSE, CAPILLARY
GLUCOSE-CAPILLARY: 168 mg/dL — AB (ref 65–99)
GLUCOSE-CAPILLARY: 177 mg/dL — AB (ref 65–99)
Glucose-Capillary: 164 mg/dL — ABNORMAL HIGH (ref 65–99)
Glucose-Capillary: 176 mg/dL — ABNORMAL HIGH (ref 65–99)
Glucose-Capillary: 210 mg/dL — ABNORMAL HIGH (ref 65–99)
Glucose-Capillary: 212 mg/dL — ABNORMAL HIGH (ref 65–99)

## 2017-01-12 LAB — CBC
HEMATOCRIT: 30.1 % — AB (ref 39.0–52.0)
HEMOGLOBIN: 10.2 g/dL — AB (ref 13.0–17.0)
MCH: 31.2 pg (ref 26.0–34.0)
MCHC: 33.9 g/dL (ref 30.0–36.0)
MCV: 92 fL (ref 78.0–100.0)
Platelets: 120 10*3/uL — ABNORMAL LOW (ref 150–400)
RBC: 3.27 MIL/uL — AB (ref 4.22–5.81)
RDW: 12.9 % (ref 11.5–15.5)
WBC: 7.5 10*3/uL (ref 4.0–10.5)

## 2017-01-12 LAB — PHOSPHORUS
PHOSPHORUS: 2.5 mg/dL (ref 2.5–4.6)
Phosphorus: 2.4 mg/dL — ABNORMAL LOW (ref 2.5–4.6)

## 2017-01-12 MED ORDER — SODIUM CHLORIDE 0.9% FLUSH: 10.0000 mL | INTRAVENOUS | Status: DC | PRN

## 2017-01-12 MED ORDER — PANTOPRAZOLE SODIUM 40 MG PO PACK
40.0000 mg | PACK | Freq: Every day | ORAL | Status: DC
  Administered 2017-01-12 – 2017-02-01 (×20): 40 mg
  Filled 2017-01-12 (×20): qty 20

## 2017-01-12 MED ORDER — INSULIN GLARGINE 100 UNIT/ML ~~LOC~~ SOLN
15.0000 [IU] | Freq: Every day | SUBCUTANEOUS | Status: DC
  Administered 2017-01-12 – 2017-01-15 (×4): 15 [IU] via SUBCUTANEOUS
  Filled 2017-01-12 (×4): qty 0.15

## 2017-01-12 MED ORDER — SODIUM CHLORIDE 0.9% FLUSH
10.0000 mL | Freq: Two times a day (BID) | INTRAVENOUS | Status: DC
  Administered 2017-01-12 – 2017-01-15 (×6): 10 mL
  Administered 2017-01-16: 20 mL
  Administered 2017-01-17 – 2017-01-19 (×5): 10 mL

## 2017-01-12 MED ORDER — CHLORHEXIDINE GLUCONATE CLOTH 2 % EX PADS
6.0000 | MEDICATED_PAD | Freq: Every day | CUTANEOUS | Status: DC
  Administered 2017-01-12 – 2017-01-19 (×9): 6 via TOPICAL

## 2017-01-12 NOTE — Care Management Note (Signed)
Case Management Note  Patient Details  Name: Loni BeckwithMiguel Garcia Cardenas MRN: [id number] Date of Birth: [birth year]  Subjective/Objective:  Pt admitted on 01/10/17 s/p presumed fall with SDH/IPH and mastoid fracture in temporal bone.  PTA, pt independent of ADLS.                    Action/Plan: Pt remains intubated currently.  Will follow for discharge planning as pt progresses.    Expected Discharge Date:                  Expected Discharge Plan:     In-House Referral:  Clinical Social Work  Discharge planning Services  CM Consult  Post Acute Care Choice:    Choice offered to:     DME Arranged:    DME Agency:     HH Arranged:    HH Agency:     Status of Service:  In process, will continue to follow  If discussed at Long Length of Stay Meetings, dates discussed:    Additional Comments:  Glennon Macmerson, Selah Zelman M, RN 01/12/2017, 4:38 PM

## 2017-01-12 NOTE — Progress Notes (Signed)
Interpreter Wyvonnia DuskyGraciela Namihira for family member

## 2017-01-12 NOTE — Progress Notes (Signed)
Patient ID: [name], male   DOB: 1966-11-01, 51 y.o.   MRN: [id number] Subjective: Patient remains intubated/ sedated so no exam availble  Objective: Vital signs in last 24 hours: Temp:  [98.5 F (36.9 C)-99.5 F (37.5 C)] 98.5 F (36.9 C) (03/13 0400) Pulse Rate:  [64-89] 73 (03/13 0803) Resp:  [11-21] 17 (03/13 0803) BP: (112-167)/(65-88) 125/75 (03/13 0803) SpO2:  [97 %-100 %] 99 % (03/13 0803) FiO2 (%):  [30 %] 30 % (03/13 0803) Weight:  [70.3 kg (154 lb 15.7 oz)-74.9 kg (165 lb 2 oz)] 74.9 kg (165 lb 2 oz) (03/13 0247)  Intake/Output from previous day: 03/12 0701 - 03/13 0700 In: 3997.1 [I.V.:2677.4; NG/GT:844.7; IV Piggyback:475] Out: 1150 [Urine:1150] Intake/Output this shift: No intake/output data recorded.  No real exam available, but nurses state he doesn't do much off sedation  Lab Results: Lab Results  Component Value Date   WBC 7.5 01/12/2017   HGB 10.2 (L) 01/12/2017   HCT 30.1 (L) 01/12/2017   MCV 92.0 01/12/2017   PLT 120 (L) 01/12/2017   Lab Results  Component Value Date   INR 1.04 01/10/2017   BMET Lab Results  Component Value Date   NA 142 01/12/2017   K 4.0 01/12/2017   CL 113 (H) 01/12/2017   CO2 26 01/12/2017   GLUCOSE 196 (H) 01/12/2017   BUN 14 01/12/2017   CREATININE 0.54 (L) 01/12/2017   CALCIUM 7.7 (L) 01/12/2017    Studies/Results: Ct Head Wo Contrast  Result Date: 01/11/2017 CLINICAL DATA:  Followup intracranial hemorrhage. EXAM: CT HEAD WITHOUT CONTRAST TECHNIQUE: Contiguous axial images were obtained from the base of the skull through the vertex without intravenous contrast. COMPARISON:  Prior CT from 01/10/2017. FINDINGS: Brain: Right holo hemispheric subdural hematoma again seen, measuring up to 4 mm in maximal thickness. Slight extension along the falx. Superimposed extensive hemorrhagic contusions within the subjacent right frontal and temporal lobes, overall relatively similar in size. Associated vasogenic edema  slightly increased. Scattered subarachnoid hemorrhage involving the bilateral cerebral hemispheres relatively similar. Few scattered peripheral hemorrhagic contusions within the left temporal lobe relatively stable in size as well the with increased edema. Mass effect within the right cerebral hemisphere with right-to-left midline shift, measuring 8 mm on today's study. Right lateral ventricle partially effaced. Mild asymmetric dilatation of the left lateral ventricle, relatively similar. Small volume intraventricular hemorrhage, likely related to redistribution, also similar. No hydrocephalus. Mild basilar cistern crowding is stable. No new intracranial process.  No appreciable infarct. Vascular: No hyperdense vessel. Skull: Slightly reduced scalp contusion on the left. Left-sided calvarial fracture again noted with blood/fluid within the left middle ear cavity and left mastoid air cells. Sinuses/Orbits: Globes and orbital soft tissues within normal limits. Scattered mucosal thickening within the paranasal sinuses. IMPRESSION: Traumatic brain injury. Left subdural hematoma similar measuring up to 4 mm, with interval increase in vasogenic edema about multiple bilateral hemorrhagic contusions. Acute subarachnoid and intraventricular hemorrhage is similar. Slightly increased right-to-left shift now measuring 8 mm. Electronically Signed   By: Rise Mu M.D.   On: 01/11/2017 04:47   Ct Head Wo Contrast  Result Date: 01/10/2017 CLINICAL DATA:  51 year old male with fall and altered mental status. Initial encounter. EXAM: CT HEAD WITHOUT CONTRAST CT CERVICAL SPINE WITHOUT CONTRAST TECHNIQUE: Multidetector CT imaging of the head and cervical spine was performed following the standard protocol without intravenous contrast. Multiplanar CT image reconstructions of the cervical spine were also generated. COMPARISON:  None. FINDINGS: CT HEAD FINDINGS Brain: Scattered areas  of intraparenchymal hemorrhage  bilaterally are noted, with the largest area measuring 4.5 cm the right frontal region. Smaller areas of hemorrhage are noted within the right temporal, right frontal, left temporal and left parietal lobes. A right subdural hematoma is noted measuring 6.5 mm in greatest diameter. Scattered areas of subarachnoid hemorrhage are identified, greatest in the high right frontal region. There is a 7 mm right to left midline shift. A small amount of intraventricular hemorrhage in the left occipital forearm identified. Vascular: No hyperdense vessels identified. Skull: A nondisplaced vertical fracture through the left mastoid air cells/left temporal bone noted with left mastoid effusion. The left middle ear is clear. Sinuses/Orbits: No acute finding. Other: Left scalp soft tissue swelling is noted. CT CERVICAL SPINE FINDINGS Alignment: Normal. Skull base and vertebrae: No cervical spine fracture identified. No focal bony lesions present. Soft tissues and spinal canal: No prevertebral fluid or swelling. No visible canal hematoma. Disc levels: Mild degenerative disc disease and spondylosis at C4-5 noted. Upper chest: Negative. Other: A fracture through the left mastoid/temporal bone noted with left mastoid effusion. IMPRESSION: Intracranial hemorrhage with 4.5 cm right frontal intraparenchymal hemorrhage, 6 mm right subdural hematoma and other scattered areas of intraparenchymal and subarachnoid hemorrhage as described. 7 mm right to left midline shift. Nondisplaced vertical fracture through the left temporal bone. Consider high-resolution CT as clinically indicated. No static evidence of acute injury to the cervical spine. Critical Value/emergent results were called by telephone at the time of interpretation on 01/10/2017 at 11:45 am to Dr. Tilden FossaELIZABETH REES , who verbally acknowledged these results. Electronically Signed   By: Harmon PierJeffrey  Hu M.D.   On: 01/10/2017 11:50   Ct Chest W Contrast  Result Date: 01/10/2017 CLINICAL  DATA:  51 year old male with fall and unresponsive. Traumatic head injury. EXAM: CT CHEST, ABDOMEN, AND PELVIS WITH CONTRAST TECHNIQUE: Multidetector CT imaging of the chest, abdomen and pelvis was performed following the standard protocol during bolus administration of intravenous contrast. CONTRAST:  100mL ISOVUE-300 IOPAMIDOL (ISOVUE-300) INJECTION 61% COMPARISON:  None. FINDINGS: CT CHEST FINDINGS Cardiovascular: No significant vascular findings. Normal heart size. No pericardial effusion. There is no evidence of thoracic aortic aneurysm. Mediastinum/Nodes: No enlarged mediastinal, hilar, or axillary lymph nodes. Thyroid gland, trachea, and esophagus demonstrate no significant findings. Lungs/Pleura: Mild central and bilateral lower lung ground-glass opacities are nonspecific and may represent edema, aspiration or infection. There is no evidence of consolidation, pleural effusion, pulmonary mass for pneumothorax. Musculoskeletal: No chest wall mass or suspicious bone lesions identified. CT ABDOMEN PELVIS FINDINGS Hepatobiliary: Mild hepatic steatosis noted. The liver and gallbladder are otherwise unremarkable. No biliary dilatation. Pancreas: Unremarkable Spleen: Unremarkable Adrenals/Urinary Tract: The bladder is markedly distended. There is moderate right and mild left hydroureteronephrosis which may be related to the distended bladder is no obstructing cause is identified. No solid renal mass or urinary calculi identified. The adrenal glands are unremarkable. Stomach/Bowel: Stomach is within normal limits. Appendix appears normal. No evidence of bowel wall thickening, distention, or inflammatory changes. Vascular/Lymphatic: Aortic atherosclerotic calcifications noted without aneurysm. No enlarged lymph nodes identified. Reproductive: Mild prostate enlargement noted. Other: No free fluid, focal collection or pneumoperitoneum. Musculoskeletal: No acute or suspicious abnormalities. Moderate degenerative disc  disease and spondylosis at L5-S1 noted. IMPRESSION: No evidence of acute injury within the chest, abdomen or pelvis. Mild central bilateral lower lung ground-glass opacities which are nonspecific but may represent edema or infection. Aspiration is a consideration. Markedly distended bladder with moderate right and mild left hydroureteronephrosis. Mild hepatic steatosis and  mild prostate enlargement. Abdominal aortic atherosclerosis. Electronically Signed   By: Harmon Pier M.D.   On: 01/10/2017 12:38   Ct Cervical Spine Wo Contrast  Result Date: 01/10/2017 CLINICAL DATA:  51 year old male with fall and altered mental status. Initial encounter. EXAM: CT HEAD WITHOUT CONTRAST CT CERVICAL SPINE WITHOUT CONTRAST TECHNIQUE: Multidetector CT imaging of the head and cervical spine was performed following the standard protocol without intravenous contrast. Multiplanar CT image reconstructions of the cervical spine were also generated. COMPARISON:  None. FINDINGS: CT HEAD FINDINGS Brain: Scattered areas of intraparenchymal hemorrhage bilaterally are noted, with the largest area measuring 4.5 cm the right frontal region. Smaller areas of hemorrhage are noted within the right temporal, right frontal, left temporal and left parietal lobes. A right subdural hematoma is noted measuring 6.5 mm in greatest diameter. Scattered areas of subarachnoid hemorrhage are identified, greatest in the high right frontal region. There is a 7 mm right to left midline shift. A small amount of intraventricular hemorrhage in the left occipital forearm identified. Vascular: No hyperdense vessels identified. Skull: A nondisplaced vertical fracture through the left mastoid air cells/left temporal bone noted with left mastoid effusion. The left middle ear is clear. Sinuses/Orbits: No acute finding. Other: Left scalp soft tissue swelling is noted. CT CERVICAL SPINE FINDINGS Alignment: Normal. Skull base and vertebrae: No cervical spine fracture  identified. No focal bony lesions present. Soft tissues and spinal canal: No prevertebral fluid or swelling. No visible canal hematoma. Disc levels: Mild degenerative disc disease and spondylosis at C4-5 noted. Upper chest: Negative. Other: A fracture through the left mastoid/temporal bone noted with left mastoid effusion. IMPRESSION: Intracranial hemorrhage with 4.5 cm right frontal intraparenchymal hemorrhage, 6 mm right subdural hematoma and other scattered areas of intraparenchymal and subarachnoid hemorrhage as described. 7 mm right to left midline shift. Nondisplaced vertical fracture through the left temporal bone. Consider high-resolution CT as clinically indicated. No static evidence of acute injury to the cervical spine. Critical Value/emergent results were called by telephone at the time of interpretation on 01/10/2017 at 11:45 am to Dr. Tilden Fossa , who verbally acknowledged these results. Electronically Signed   By: Harmon Pier M.D.   On: 01/10/2017 11:50   Ct Abdomen Pelvis W Contrast  Result Date: 01/10/2017 CLINICAL DATA:  51 year old male with fall and unresponsive. Traumatic head injury. EXAM: CT CHEST, ABDOMEN, AND PELVIS WITH CONTRAST TECHNIQUE: Multidetector CT imaging of the chest, abdomen and pelvis was performed following the standard protocol during bolus administration of intravenous contrast. CONTRAST:  ISOVUE-300 IOPAMIDOL (ISOVUE-300) INJECTION 61% COMPARISON:  None. FINDINGS: CT CHEST FINDINGS Cardiovascular: No significant vascular findings. Normal heart size. No pericardial effusion. There is no evidence of thoracic aortic aneurysm. Mediastinum/Nodes: No enlarged mediastinal, hilar, or axillary lymph nodes. Thyroid gland, trachea, and esophagus demonstrate no significant findings. Lungs/Pleura: Mild central and bilateral lower lung ground-glass opacities are nonspecific and may represent edema, aspiration or infection. There is no evidence of consolidation, pleural  effusion, pulmonary mass for pneumothorax. Musculoskeletal: No chest wall mass or suspicious bone lesions identified. CT ABDOMEN PELVIS FINDINGS Hepatobiliary: Mild hepatic steatosis noted. The liver and gallbladder are otherwise unremarkable. No biliary dilatation. Pancreas: Unremarkable Spleen: Unremarkable Adrenals/Urinary Tract: The bladder is markedly distended. There is moderate right and mild left hydroureteronephrosis which may be related to the distended bladder is no obstructing cause is identified. No solid renal mass or urinary calculi identified. The adrenal glands are unremarkable. Stomach/Bowel: Stomach is within normal limits. Appendix appears normal. No evidence  of bowel wall thickening, distention, or inflammatory changes. Vascular/Lymphatic: Aortic atherosclerotic calcifications noted without aneurysm. No enlarged lymph nodes identified. Reproductive: Mild prostate enlargement noted. Other: No free fluid, focal collection or pneumoperitoneum. Musculoskeletal: No acute or suspicious abnormalities. Moderate degenerative disc disease and spondylosis at L5-S1 noted. IMPRESSION: No evidence of acute injury within the chest, abdomen or pelvis. Mild central bilateral lower lung ground-glass opacities which are nonspecific but may represent edema or infection. Aspiration is a consideration. Markedly distended bladder with moderate right and mild left hydroureteronephrosis. Mild hepatic steatosis and mild prostate enlargement. Abdominal aortic atherosclerosis. Electronically Signed   By: Harmon Pier M.D.   On: 01/10/2017 12:38   Dg Chest Port 1 View  Result Date: 01/12/2017 CLINICAL DATA:  51 year old male post fall.  Subsequent encounter. EXAM: PORTABLE CHEST 1 VIEW COMPARISON:  01/10/2017. FINDINGS: Endotracheal tube tip 3.5 cm above the carina. Nasogastric tube courses into the stomach. Tip is not included on the present exam. Basilar subsegmental atelectasis greater on the left. Limited evaluation  of the aorta by plain film exam. Heart size top-normal. IMPRESSION: Basilar subsegmental atelectasis greater on left. Electronically Signed   By: Lacy Duverney M.D.   On: 01/12/2017 07:49   Dg Chest Port 1 View  Result Date: 01/10/2017 CLINICAL DATA:  51 year old male with fall and unresponsiveness and traumatic head injury. Status post intubation EXAM: PORTABLE CHEST 1 VIEW COMPARISON:  Chest CT dated 10/01/2017 FINDINGS: An endotracheal tube is noted with tip approximately 4.5 cm above the carina. And enteric tube extends into the abdomen with side-port in the epigastric area over the L1 vertebra and the tip beyond the inferior margin of the image, likely in the distal stomach. Bibasilar streaky densities more pronounced on the left as seen on the prior CT may represent atelectasis/infiltrate. No focal consolidation, pleural effusion, or pneumothorax. The cardiac silhouette is within normal limits. No acute osseous pathology. IMPRESSION: 1. Endotracheal tube above the carina and enteric tube with tip likely in the mid to distal stomach. 2. Bibasilar linear densities, likely atelectasis versus infiltrate. Electronically Signed   By: Elgie Collard M.D.   On: 01/10/2017 20:26   Dg Chest Port 1 View  Result Date: 01/10/2017 CLINICAL DATA:  51 year old male with altered mental status and fever. EXAM: PORTABLE CHEST 1 VIEW COMPARISON:  None. FINDINGS: The cardiomediastinal silhouette is unremarkable. There is no evidence of focal airspace disease, pulmonary edema, suspicious pulmonary nodule/mass, pleural effusion, or pneumothorax. No acute bony abnormalities are identified. IMPRESSION: No active disease. Electronically Signed   By: Harmon Pier M.D.   On: 01/10/2017 10:30   Ct Maxillofacial Wo Cm  Result Date: 01/10/2017 CLINICAL DATA:  51 year old male status post fall with altered mental status. Hemorrhagic cerebral contusions with subarachnoid hemorrhage and subdural hematoma on the head CT earlier  today. EXAM: CT MAXILLOFACIAL WITHOUT CONTRAST TECHNIQUE: Multidetector CT imaging of the maxillofacial structures was performed. Multiplanar CT image reconstructions were also generated. A small metallic BB was placed on the right temple in order to reliably differentiate right from left. COMPARISON:  Head and cervical spine CT 1009 hours today FINDINGS: Motion artifact on the initial axial images. Osseous: Mandible intact. Poor dentition right mandible bicuspid. No maxilla fracture. No zygoma fracture. No definite acute nasal bone fracture. Pterygoids are intact. The central skullbase appears intact. There is a nondepressed horizontally oriented left lateral skull fracture through the squamousal portion of the temporal bone best seen on coronal series 13, image 47. On sagittal images (series 14, image  73) there appears to be a second parallel nondisplaced fracture which tracks from the the lateral left temporal bone anteriorly to the greater wing of the sphenoid (series 14, image 62) and perhaps into the lateral wall of the left orbit (image 58). This orbital component also appears to beyond series 11, image 13. There is no associated orbital soft tissue abnormality. There is a superimposed nondisplaced vertically-oriented fracture through the left mastoid air cells. There is fluid/blood throughout the mastoids and also in the left inferior temp panic cavity (hypo 10 planum) and throughout the left EAC. The left ossicles appear to remain normally aligned. Orbits: Aside from the suggestion of a nondisplaced fracture of the left skull which travels into the lateral wall of the left orbit (detailed above), the orbital walls appear intact. Bilateral orbits soft tissues appear normal. Sinuses: Left mastoids and middle ear described above. Mild paranasal sinus mucosal thickening. Right tympanic cavity and mastoids are clear. Soft tissues: Negative visualized noncontrast larynx, pharynx (retained secretions in the  hypopharynx), parapharyngeal spaces, retropharyngeal space, sublingual space, submandibular glands and parotid glands. The masticator spaces appear normal. Limited intracranial: Partially visible extensive right frontal and temporal hemorrhagic contusions plus right side subdural hematoma. Smaller posterior left temporal lobe hemorrhagic contusion also re- demonstrated. IMPRESSION: 1. Parallel nondisplaced horizontally oriented skull fractures along the left lateral convexity (sagittal series 14, image 73) tracking anteriorly to the greater wing of the left sphenoid. There may be extension into the left lateral wall of the orbit, but the orbits otherwise appear normal. 2. Vertically-oriented fracture through the left mastoid bone and air cells with fluid/blood in the left middle ear, mastoids, and EAC. The left ossicles appear intact. 3. No facial fracture identified. 4. Partially visible extensive intracranial hemorrhage as seen on the head CT today. Electronically Signed   By: Odessa Fleming M.D.   On: 01/10/2017 12:58    Assessment/Plan: Stable, ICP controlled   LOS: 2 days    Gabriana Wilmott S 01/12/2017, 8:17 AM

## 2017-01-12 NOTE — Progress Notes (Signed)
Inpatient Diabetes Program Recommendations  AACE/ADA: New Consensus Statement on Inpatient Glycemic Control (2015)  Target Ranges:  Prepandial:   less than 140 mg/dL      Peak postprandial:   less than 180 mg/dL (1-2 hours)      Critically ill patients:  140 - 180 mg/dL   Lab Results  Component Value Date   GLUCAP 212 (H) 01/12/2017   HGBA1C 10.4 (H) 01/10/2017   Results for [name], [place] (MRN [id number]) as of 01/12/2017 12:08  Ref. Range 01/11/2017 08:16 01/11/2017 11:40 01/11/2017 16:15 01/11/2017 19:38 01/11/2017 23:40 01/12/2017 03:17 01/12/2017 08:05  Glucose-Capillary Latest Ref Range: 65 - 99 mg/dL 086209 (H) 578235 (H) 469257 (H) 262 (H) 197 (H) 164 (H) 212 (H)   Admit with: Head Trauma  History: DM2  Home DM Meds: Glipizide 5 mg daily                             Jentadueto 2.03/999 mg BID  Current Insulin Orders: Novolog Moderate Correction Scale/ SSI (0-15 units) Q4 hours, Lantus 15 units Q4H  Inpatient Diabetes Program Recommendations:   Insulin - Basal: Noted increase in Lantus to 15 units daily, please note that if tube feedings are held, basal insulin may need to be decreased.  Insulin - Meal Coverage: If CBG's trend greater than goal, please consider tube feeding coverage (to be held if tube feedings are held).  Thank you,  Kristine LineaKaren Shatarra Wehling, RN, MSN Diabetes Coordinator Inpatient Diabetes Program [phone] (Team Pager)

## 2017-01-12 NOTE — Progress Notes (Addendum)
Patient ID: [name], male   DOB: [birth year], 51 y.o.   MRN: [id number] Follow up - Trauma Critical Care  Patient Details:    [name] is an 51 y.o. male.  Lines/tubes : Airway 8 mm (Active)  Secured at (cm) 22 cm 01/12/2017  8:03 AM  Measured From Lips 01/12/2017  8:03 AM  Secured Location Left 01/12/2017  8:03 AM  Secured By [employer] Tube [employer] 01/12/2017  8:03 AM  Tube Holder Repositioned Yes 01/12/2017  8:03 AM  Cuff Pressure (cm H2O) 24 cm H2O 01/11/2017  3:34 PM  Site Condition Cool;Dry 01/12/2017  8:03 AM     NG/OG Tube Orogastric 16 Fr. Center mouth (Active)  Site Assessment Clean;Dry;Intact 01/12/2017  6:00 AM  Ongoing Placement Verification Xray 01/11/2017 12:00 PM  Status Infusing tube feed 01/12/2017  6:00 AM     ICP/Ventriculostomy ICP only via fiberoptic sensor-microsensor Right (Active)  Level Other (Comment) 01/12/2017  6:00 AM  Status To pressure monitoring 01/12/2017  6:00 AM  Site Assessment Clean;Dry 01/12/2017  6:00 AM  Dressing Status Clean;Dry;Intact 01/12/2017  6:00 AM    Microbiology/Sepsis markers: Results for orders placed or performed during the hospital encounter of 01/10/17  Blood Culture (routine x 2)     Status: None (Preliminary result)   Collection Time: 01/10/17 10:35 AM  Result Value Ref Range Status   Specimen Description BLOOD RIGHT ANTECUBITAL  Final   Special Requests BOTTLES DRAWN AEROBIC AND ANAEROBIC 10CC  Final   Culture NO GROWTH 1 DAY  Final   Report Status PENDING  Incomplete  Blood Culture (routine x 2)     Status: None (Preliminary result)   Collection Time: 01/10/17 10:40 AM  Result Value Ref Range Status   Specimen Description BLOOD RIGHT HAND  Final   Special Requests BOTTLES DRAWN AEROBIC AND ANAEROBIC 5CC  Final   Culture NO GROWTH 1 DAY  Final   Report Status PENDING  Incomplete  Urine culture     Status: None   Collection Time: 01/10/17 12:43 PM  Result Value Ref Range Status   Specimen  Description URINE, CATHETERIZED  Final   Special Requests NONE  Final   Culture NO GROWTH  Final   Report Status 01/11/2017 FINAL  Final  MRSA PCR Screening     Status: None   Collection Time: 01/10/17  3:21 PM  Result Value Ref Range Status   MRSA by PCR NEGATIVE NEGATIVE Final    Comment:        The GeneXpert MRSA Assay (FDA approved for NASAL specimens only), is one component of a comprehensive MRSA colonization surveillance program. It is not intended to diagnose MRSA infection nor to guide or monitor treatment for MRSA infections.     Anti-infectives:  Anti-infectives    Start     Dose/Rate Route Frequency Ordered Stop   01/10/17 1600  metroNIDAZOLE (FLAGYL) IVPB 500 mg     500 mg 100 mL/hr over 60 Minutes Intravenous  Once 01/10/17 1518 01/10/17 1629   01/10/17 1045  cefTRIAXone (ROCEPHIN) 1 g in dextrose 5 % 50 mL IVPB     1 g 100 mL/hr over 30 Minutes Intravenous  Once 01/10/17 1035 01/10/17 1248   01/10/17 1045  azithromycin (ZITHROMAX) 500 mg in dextrose 5 % 250 mL IVPB     500 mg 250 mL/hr over 60 Minutes Intravenous  Once 01/10/17 1035 01/10/17 1353   01/10/17 1045  metroNIDAZOLE (FLAGYL) IVPB 500 mg  Status:  Discontinued  500 mg 100 mL/hr over 60 Minutes Intravenous  Once 01/10/17 1035 01/10/17 1517      Best Practice/Protocols:  VTE Prophylaxis: Mechanical Continous Sedation  Consults: Treatment Team:  Tia Alert, MD    Studies:    Events:  Subjective:    Overnight Issues:  none Objective:  Vital signs for last 24 hours: Temp:  [98.5 F (36.9 C)-99.5 F (37.5 C)] 98.5 F (36.9 C) (03/13 0400) Pulse Rate:  [64-89] 73 (03/13 0803) Resp:  [11-21] 17 (03/13 0803) BP: (112-167)/(65-88) 125/75 (03/13 0803) SpO2:  [97 %-100 %] 99 % (03/13 0803) FiO2 (%):  [30 %] 30 % (03/13 0803) Weight:  [70.3 kg (154 lb 15.7 oz)-74.9 kg (165 lb 2 oz)] 74.9 kg (165 lb 2 oz) (03/13 0247)  Hemodynamic parameters for last 24 hours:    Intake/Output  from previous day: 03/12 0701 - 03/13 0700 In: 3997.1 [I.V.:2677.4; NG/GT:844.7; IV Piggyback:475] Out: 1150 [Urine:1150]  Intake/Output this shift: Total I/O In: 9 [I.V.:9] Out: -   Vent settings for last 24 hours: Vent Mode: PRVC FiO2 (%):  [30 %] 30 % Set Rate:  [15 bmp] 15 bmp Vt Set:  [450 mL] 450 mL PEEP:  [5 cmH20] 5 cmH20 Pressure Support:  [5 cmH20] 5 cmH20 Plateau Pressure:  [11 cmH20-15 cmH20] 12 cmH20  Physical Exam:  Intubated/sedated cta b/l Reg Soft, nd, +BS +SCDs, good pulses, no edema  Results for orders placed or performed during the hospital encounter of 01/10/17 (from the past 24 hour(s))  Magnesium     Status: None   Collection Time: 01/11/17  8:51 AM  Result Value Ref Range   Magnesium 2.3 1.7 - 2.4 mg/dL  Phosphorus     Status: None   Collection Time: 01/11/17  8:51 AM  Result Value Ref Range   Phosphorus 2.6 2.5 - 4.6 mg/dL  Glucose, capillary     Status: Abnormal   Collection Time: 01/11/17 11:40 AM  Result Value Ref Range   Glucose-Capillary 235 (H) 65 - 99 mg/dL   Comment 1 Notify RN    Comment 2 Document in Chart   Glucose, capillary     Status: Abnormal   Collection Time: 01/11/17  4:15 PM  Result Value Ref Range   Glucose-Capillary 257 (H) 65 - 99 mg/dL   Comment 1 Notify RN    Comment 2 Document in Chart   Magnesium     Status: None   Collection Time: 01/11/17  4:52 PM  Result Value Ref Range   Magnesium 2.3 1.7 - 2.4 mg/dL  Phosphorus     Status: None   Collection Time: 01/11/17  4:52 PM  Result Value Ref Range   Phosphorus 2.5 2.5 - 4.6 mg/dL  Glucose, capillary     Status: Abnormal   Collection Time: 01/11/17  7:38 PM  Result Value Ref Range   Glucose-Capillary 262 (H) 65 - 99 mg/dL  Glucose, capillary     Status: Abnormal   Collection Time: 01/11/17 11:40 PM  Result Value Ref Range   Glucose-Capillary 197 (H) 65 - 99 mg/dL  Glucose, capillary     Status: Abnormal   Collection Time: 01/12/17  3:17 AM  Result Value Ref  Range   Glucose-Capillary 164 (H) 65 - 99 mg/dL  Blood gas, arterial     Status: None   Collection Time: 01/12/17  4:06 AM  Result Value Ref Range   FIO2 30.00    Delivery systems VENTILATOR    Mode PRESSURE REGULATED VOLUME  CONTROL    VT 450 mL   LHR 15 resp/min   Peep/cpap 5.0 cm H20   pH, Arterial 7.370 7.350 - 7.450   pCO2 arterial 46.1 32.0 - 48.0 mmHg   pO2, Arterial 96.5 83.0 - 108.0 mmHg   Bicarbonate 26.0 20.0 - 28.0 mmol/L   Acid-Base Excess 1.3 0.0 - 2.0 mmol/L   O2 Saturation 97.5 %   Patient temperature 98.5    Collection site RIGHT RADIAL    Drawn by [phone]    Sample type ARTERIAL DRAW    Allens test (pass/fail) PASS PASS  CBC     Status: Abnormal   Collection Time: 01/12/17  5:15 AM  Result Value Ref Range   WBC 7.5 4.0 - 10.5 K/uL   RBC 3.27 (L) 4.22 - 5.81 MIL/uL   Hemoglobin 10.2 (L) 13.0 - 17.0 g/dL   HCT 60.4 (L) 54.0 - 98.1 %   MCV 92.0 78.0 - 100.0 fL   MCH 31.2 26.0 - 34.0 pg   MCHC 33.9 30.0 - 36.0 g/dL   RDW 19.1 47.8 - 29.5 %   Platelets 120 (L) 150 - 400 K/uL  Basic metabolic panel     Status: Abnormal   Collection Time: 01/12/17  5:15 AM  Result Value Ref Range   Sodium 142 135 - 145 mmol/L   Potassium 4.0 3.5 - 5.1 mmol/L   Chloride 113 (H) 101 - 111 mmol/L   CO2 26 22 - 32 mmol/L   Glucose, Bld 196 (H) 65 - 99 mg/dL   BUN 14 6 - 20 mg/dL   Creatinine, Ser 6.21 (L) 0.61 - 1.24 mg/dL   Calcium 7.7 (L) 8.9 - 10.3 mg/dL   GFR calc non Af Amer >60 >60 mL/min   GFR calc Af Amer >60 >60 mL/min   Anion gap 3 (L) 5 - 15  Magnesium     Status: None   Collection Time: 01/12/17  5:15 AM  Result Value Ref Range   Magnesium 2.4 1.7 - 2.4 mg/dL  Phosphorus     Status: Abnormal   Collection Time: 01/12/17  5:15 AM  Result Value Ref Range   Phosphorus 2.4 (L) 2.5 - 4.6 mg/dL  Glucose, capillary     Status: Abnormal   Collection Time: 01/12/17  8:05 AM  Result Value Ref Range   Glucose-Capillary 212 (H) 65 - 99 mg/dL   Comment 1 Notify RN     Comment 2 Document in Chart     Assessment & Plan: Present on Admission: . Fall . Subdural hemorrhage (HCC) Fall TBI/L SDH/B ICC/IVH - BOLT placed 3/12, ICP in teens, mgmt per Dr. Yetta Barre Vent dependent resp failure - weaning for now, will not extubate DM/Hyperglycemia - BS still slightly elevated, increase lantus FEN - hypokalemia - resolved, PICC placed, tolerating TF - continue; decrease IVF; change IV protonix to via tube VTE - PAS DIspo - ICU   LOS: 2 days  Spoke with family at [facility]  Additional comments:I reviewed the patient's new clinical lab test results.  and I reviewed the patients new imaging test results.   Critical Care Total Time*: 30 Minutes  Mary Sella. Andrey Campanile, MD, FACS General, Bariatric, & Minimally Invasive Surgery [facility], [place]   01/12/2017  *Care during the described time interval was provided by me. I have reviewed this patient's available data, including medical history, events of note, physical examination and test results as part of my evaluation.

## 2017-01-12 NOTE — Progress Notes (Signed)
Peripherally Inserted Central Catheter/Midline Placement  The IV Nurse has discussed with the patient and/or persons authorized to consent for the patient, the purpose of this procedure and the potential benefits and risks involved with this procedure.  The benefits include less needle sticks, lab draws from the catheter, and the patient may be discharged home with the catheter. Risks include, but not limited to, infection, bleeding, blood clot (thrombus formation), and puncture of an artery; nerve damage and irregular heartbeat and possibility to perform a PICC exchange if needed/ordered by physician.  Alternatives to this procedure were also discussed.  Bard Power PICC patient education guide, fact sheet on infection prevention and patient information card has been provided to patient /or left at bedside.    PICC/Midline Placement Documentation        Lisabeth DevoidGibbs, Mozell Haber Jeanette 01/12/2017, 3:09 PM Consent obtained by Arlina RobesLinda Alford, RN from family at bedside

## 2017-01-13 ENCOUNTER — Inpatient Hospital Stay (HOSPITAL_COMMUNITY): Payer: Medicaid Other

## 2017-01-13 ENCOUNTER — Encounter (HOSPITAL_COMMUNITY): Admission: EM | Disposition: A | Discharge status: Rehabilitation Facility | Payer: Self-pay | Source: Home / Self Care

## 2017-01-13 ENCOUNTER — Inpatient Hospital Stay (HOSPITAL_COMMUNITY): Payer: Medicaid Other | Admitting: Anesthesiology

## 2017-01-13 HISTORY — PX: CRANIOTOMY: SHX93

## 2017-01-13 LAB — BASIC METABOLIC PANEL
ANION GAP: 6 (ref 5–15)
BUN: 15 mg/dL (ref 6–20)
CALCIUM: 8 mg/dL — AB (ref 8.9–10.3)
CO2: 30 mmol/L (ref 22–32)
Chloride: 107 mmol/L (ref 101–111)
Creatinine, Ser: 0.59 mg/dL — ABNORMAL LOW (ref 0.61–1.24)
GLUCOSE: 190 mg/dL — AB (ref 65–99)
POTASSIUM: 3.9 mmol/L (ref 3.5–5.1)
Sodium: 143 mmol/L (ref 135–145)

## 2017-01-13 LAB — CBC
HCT: 29.9 % — ABNORMAL LOW (ref 39.0–52.0)
Hemoglobin: 9.9 g/dL — ABNORMAL LOW (ref 13.0–17.0)
MCH: 30.5 pg (ref 26.0–34.0)
MCHC: 33.1 g/dL (ref 30.0–36.0)
MCV: 92 fL (ref 78.0–100.0)
Platelets: 125 10*3/uL — ABNORMAL LOW (ref 150–400)
RBC: 3.25 MIL/uL — AB (ref 4.22–5.81)
RDW: 12.2 % (ref 11.5–15.5)
WBC: 6.7 10*3/uL (ref 4.0–10.5)

## 2017-01-13 LAB — GLUCOSE, CAPILLARY
GLUCOSE-CAPILLARY: 196 mg/dL — AB (ref 65–99)
GLUCOSE-CAPILLARY: 182 mg/dL — AB (ref 65–99)
GLUCOSE-CAPILLARY: 208 mg/dL — AB (ref 65–99)
Glucose-Capillary: 181 mg/dL — ABNORMAL HIGH (ref 65–99)
Glucose-Capillary: 186 mg/dL — ABNORMAL HIGH (ref 65–99)

## 2017-01-13 LAB — BLOOD GAS, ARTERIAL
ACID-BASE EXCESS: 5.2 mmol/L — AB (ref 0.0–2.0)
BICARBONATE: 30.1 mmol/L — AB (ref 20.0–28.0)
FIO2: 30
O2 Saturation: 97.6 %
PEEP: 5 cmH2O
Patient temperature: 98.9
RATE: 15 resp/min
VT: 450 mL
pCO2 arterial: 52.5 mmHg — ABNORMAL HIGH (ref 32.0–48.0)
pH, Arterial: 7.377 (ref 7.350–7.450)
pO2, Arterial: 97 mmHg (ref 83.0–108.0)

## 2017-01-13 LAB — TRIGLYCERIDES: Triglycerides: 152 mg/dL — ABNORMAL HIGH (ref <150)

## 2017-01-13 SURGERY — CRANIOTOMY HEMATOMA EVACUATION SUBDURAL
Anesthesia: General | Site: Head | Laterality: Right

## 2017-01-13 MED ORDER — ROCURONIUM BROMIDE 100 MG/10ML IV SOLN
INTRAVENOUS | Status: DC | PRN
  Administered 2017-01-13 – 2017-01-14 (×2): 50 mg via INTRAVENOUS

## 2017-01-13 MED ORDER — BACITRACIN ZINC 500 UNIT/GM EX OINT
TOPICAL_OINTMENT | CUTANEOUS | Status: AC
  Filled 2017-01-13: qty 28.35

## 2017-01-13 MED ORDER — PIPERACILLIN-TAZOBACTAM 3.375 G IVPB
3.3750 g | Freq: Three times a day (TID) | INTRAVENOUS | Status: DC
  Administered 2017-01-13 – 2017-01-18 (×14): 3.375 g via INTRAVENOUS
  Filled 2017-01-13 (×15): qty 50

## 2017-01-13 MED ORDER — THROMBIN 5000 UNITS EX SOLR
CUTANEOUS | Status: AC
  Filled 2017-01-13: qty 5000

## 2017-01-13 MED ORDER — SUFENTANIL CITRATE 50 MCG/ML IV SOLN
INTRAVENOUS | Status: DC | PRN
  Administered 2017-01-13: 20 ug via INTRAVENOUS
  Administered 2017-01-14 (×3): 10 ug via INTRAVENOUS

## 2017-01-13 MED ORDER — MANNITOL 25 % IV SOLN
25.0000 g | INTRAVENOUS | Status: AC
  Administered 2017-01-13 (×2): 12.5 g via INTRAVENOUS
  Filled 2017-01-13: qty 100

## 2017-01-13 MED ORDER — LIDOCAINE-EPINEPHRINE (PF) 2 %-1:200000 IJ SOLN
INTRAMUSCULAR | Status: AC
  Filled 2017-01-13: qty 20

## 2017-01-13 MED ORDER — SUFENTANIL CITRATE 50 MCG/ML IV SOLN
INTRAVENOUS | Status: AC
  Filled 2017-01-13: qty 1

## 2017-01-13 MED ORDER — PHENYLEPHRINE HCL 10 MG/ML IJ SOLN
INTRAMUSCULAR | Status: AC
  Filled 2017-01-13: qty 1

## 2017-01-13 MED ORDER — PIPERACILLIN-TAZOBACTAM 3.375 G IVPB 30 MIN
3.3750 g | Freq: Once | INTRAVENOUS | Status: AC
  Administered 2017-01-13: 3.375 g via INTRAVENOUS
  Filled 2017-01-13: qty 50

## 2017-01-13 MED ORDER — BUPIVACAINE-EPINEPHRINE (PF) 0.5% -1:200000 IJ SOLN
INTRAMUSCULAR | Status: AC
  Filled 2017-01-13: qty 30

## 2017-01-13 MED ORDER — THROMBIN 20000 UNITS EX SOLR
CUTANEOUS | Status: AC
  Filled 2017-01-13: qty 20000

## 2017-01-13 MED ORDER — BISACODYL 10 MG RE SUPP
10.0000 mg | Freq: Once | RECTAL | Status: AC
  Administered 2017-01-13: 10 mg via RECTAL
  Filled 2017-01-13: qty 1

## 2017-01-13 MED ORDER — SODIUM CHLORIDE 0.9 % IV SOLN
INTRAVENOUS | Status: DC | PRN
  Administered 2017-01-13: 23:00:00 via INTRAVENOUS

## 2017-01-13 SURGICAL SUPPLY — 73 items
ADH SKN CLS APL DERMABOND .7 (GAUZE/BANDAGES/DRESSINGS) ×1
APL SKNCLS STERI-STRIP NONHPOA (GAUZE/BANDAGES/DRESSINGS)
BANDAGE GAUZE 4  KLING STR (GAUZE/BANDAGES/DRESSINGS) ×3 IMPLANT
BENZOIN TINCTURE PRP APPL 2/3 (GAUZE/BANDAGES/DRESSINGS) IMPLANT
BIT DRILL WIRE PASS 1.3MM (BIT) ×1 IMPLANT
BLADE CLIPPER SURG (BLADE) ×3 IMPLANT
BLADE ULTRA TIP 2M (BLADE) ×3 IMPLANT
BNDG GAUZE ELAST 4 BULKY (GAUZE/BANDAGES/DRESSINGS) ×6 IMPLANT
BUR ACORN 6.0 PRECISION (BURR) ×2 IMPLANT
BUR ACORN 6.0MM PRECISION (BURR) ×1
BUR MATCHSTICK NEURO 3.0 LAGG (BURR) IMPLANT
BUR SPIRAL ROUTER 2.3 (BUR) ×2 IMPLANT
BUR SPIRAL ROUTER 2.3MM (BUR) ×1
CANISTER SUCT 3000ML PPV (MISCELLANEOUS) ×3 IMPLANT
CARTRIDGE OIL MAESTRO DRILL (MISCELLANEOUS) ×1 IMPLANT
CLIP TI MEDIUM 6 (CLIP) IMPLANT
DERMABOND ADVANCED (GAUZE/BANDAGES/DRESSINGS) ×2
DERMABOND ADVANCED .7 DNX12 (GAUZE/BANDAGES/DRESSINGS) ×1 IMPLANT
DIFFUSER DRILL AIR PNEUMATIC (MISCELLANEOUS) ×3 IMPLANT
DRAPE NEUROLOGICAL W/INCISE (DRAPES) ×3 IMPLANT
DRAPE SURG 17X23 STRL (DRAPES) IMPLANT
DRAPE WARM FLUID 44X44 (DRAPE) ×3 IMPLANT
DRILL WIRE PASS 1.3MM (BIT) ×3
DURAMATRIX ONLAY 3X3 (Plate) ×3 IMPLANT
DURAPREP 6ML APPLICATOR 50/CS (WOUND CARE) ×3 IMPLANT
ELECT CAUTERY BLADE 6.4 (BLADE) ×3 IMPLANT
ELECT REM PT RETURN 9FT ADLT (ELECTROSURGICAL) ×3
ELECTRODE REM PT RTRN 9FT ADLT (ELECTROSURGICAL) ×1 IMPLANT
EVACUATOR 1/8 PVC DRAIN (DRAIN) IMPLANT
EVACUATOR SILICONE 100CC (DRAIN) IMPLANT
GAUZE SPONGE 4X4 12PLY STRL (GAUZE/BANDAGES/DRESSINGS) ×3 IMPLANT
GAUZE SPONGE 4X4 16PLY XRAY LF (GAUZE/BANDAGES/DRESSINGS) IMPLANT
GLOVE BIO SURGEON STRL SZ 6.5 (GLOVE) ×4 IMPLANT
GLOVE BIO SURGEON STRL SZ7 (GLOVE) ×9 IMPLANT
GLOVE BIO SURGEONS STRL SZ 6.5 (GLOVE) ×2
GLOVE ECLIPSE 6.5 STRL STRAW (GLOVE) ×3 IMPLANT
GLOVE EXAM NITRILE LRG STRL (GLOVE) IMPLANT
GLOVE EXAM NITRILE XL STR (GLOVE) IMPLANT
GLOVE EXAM NITRILE XS STR PU (GLOVE) IMPLANT
GOWN STRL REUS W/ TWL LRG LVL3 (GOWN DISPOSABLE) ×2 IMPLANT
GOWN STRL REUS W/ TWL XL LVL3 (GOWN DISPOSABLE) IMPLANT
GOWN STRL REUS W/TWL 2XL LVL3 (GOWN DISPOSABLE) IMPLANT
GOWN STRL REUS W/TWL LRG LVL3 (GOWN DISPOSABLE) ×6
GOWN STRL REUS W/TWL XL LVL3 (GOWN DISPOSABLE)
HEMOSTAT SURGICEL 2X14 (HEMOSTASIS) IMPLANT
KIT BASIN OR (CUSTOM PROCEDURE TRAY) ×3 IMPLANT
KIT ROOM TURNOVER OR (KITS) ×3 IMPLANT
NEEDLE HYPO 25X1 1.5 SAFETY (NEEDLE) ×3 IMPLANT
NS IRRIG 1000ML POUR BTL (IV SOLUTION) ×3 IMPLANT
OIL CARTRIDGE MAESTRO DRILL (MISCELLANEOUS) ×3
PACK CRANIOTOMY (CUSTOM PROCEDURE TRAY) ×3 IMPLANT
PATTIES SURGICAL .5 X.5 (GAUZE/BANDAGES/DRESSINGS) IMPLANT
PATTIES SURGICAL .5 X3 (DISPOSABLE) IMPLANT
PATTIES SURGICAL 1X1 (DISPOSABLE) IMPLANT
SPONGE NEURO XRAY DETECT 1X3 (DISPOSABLE) IMPLANT
SPONGE SURGIFOAM ABS GEL 100 (HEMOSTASIS) ×3 IMPLANT
STAPLER VISISTAT 35W (STAPLE) ×9 IMPLANT
SUT ETHILON 3 0 FSL (SUTURE) IMPLANT
SUT ETHILON 3 0 PS 1 (SUTURE) IMPLANT
SUT NURALON 4 0 TR CR/8 (SUTURE) ×3 IMPLANT
SUT STEEL 0 (SUTURE)
SUT STEEL 0 18XMFL TIE 17 (SUTURE) IMPLANT
SUT VIC AB 2-0 CT2 18 VCP726D (SUTURE) ×12 IMPLANT
SUT VIC AB 3-0 SH 8-18 (SUTURE) ×3 IMPLANT
SYR CONTROL 10ML LL (SYRINGE) ×3 IMPLANT
TAPE CLOTH 1X10 TAN NS (GAUZE/BANDAGES/DRESSINGS) ×3 IMPLANT
TOWEL GREEN STERILE (TOWEL DISPOSABLE) ×2 IMPLANT
TOWEL GREEN STERILE FF (TOWEL DISPOSABLE) ×3 IMPLANT
TRAY FOLEY W/METER SILVER 16FR (SET/KITS/TRAYS/PACK) ×3 IMPLANT
TUBE CONNECTING 12'X1/4 (SUCTIONS) ×1
TUBE CONNECTING 12X1/4 (SUCTIONS) ×2 IMPLANT
UNDERPAD 30X30 (UNDERPADS AND DIAPERS) ×3 IMPLANT
WATER STERILE IRR 1000ML POUR (IV SOLUTION) ×3 IMPLANT

## 2017-01-13 NOTE — Progress Notes (Signed)
Patient transported on vent to CT and back to 65M-10 without complication.

## 2017-01-13 NOTE — Progress Notes (Signed)
Pharmacy Antibiotic Note  [name] is a 51 y.o. male admitted on 01/10/2017 with pneumonia, thick and purulent sputum per Trauma.  Pharmacy has been consulted for Zosyn dosing. Tmax/24h 101.5, wbc wnl, CrCl~90-95.  Plan: Zosyn 3.375g IV (30min inf) x1; then 3.375g IV q8h (4h inf) Monitor clinical progress, c/s, renal function, abx plan/LOT   Height: 5\' 1"  (154.9 cm) Weight: 159 lb 9.8 oz (72.4 kg) IBW/kg (Calculated) : 52.3  Temp (24hrs), Avg:99.8 F (37.7 C), Min:98.6 F (37 C), Max:101.5 F (38.6 C)   Recent Labs Lab 01/10/17 1022 01/10/17 1026 01/10/17 1027 01/10/17 1540 01/11/17 0227 01/12/17 0515 01/13/17 0500  WBC 15.2*  --   --   --  10.2 7.5 6.7  CREATININE 1.00 0.70  --   --  0.76 0.54* 0.59*  LATICACIDVEN  --   --  7.79* 2.0*  --   --   --     Estimated Creatinine Clearance: 93.2 mL/min (by C-G formula based on SCr of 0.59 mg/dL (L)).    No Known Allergies  [name], PharmD, BCPS Clinical Pharmacist 01/13/2017 10:15 AM

## 2017-01-13 NOTE — Progress Notes (Signed)
Follow up - Trauma and Critical Care  Patient Details:    [name] is an 51 y.o. male.  Lines/tubes : Airway 8 mm (Active)  Secured at (cm) 22 cm 01/13/2017  8:04 AM  Measured From Lips 01/13/2017  8:04 AM  Secured Location Left 01/13/2017  8:04 AM  Secured By [employer] 01/13/2017  8:04 AM  Tube Holder Repositioned Yes 01/13/2017  8:04 AM  Cuff Pressure (cm H2O) 30 cm H2O 01/12/2017 12:00 PM  Site Condition Dry 01/13/2017  8:04 AM     PICC Double Lumen 01/12/17 PICC Right Basilic 42 cm 0 cm (Active)  Indication for Insertion or Continuance of Line Vasoactive infusions;Prolonged intravenous therapies 01/12/2017  8:00 PM  Exposed Catheter (cm) 0 cm 01/12/2017  3:28 PM  Site Assessment Clean;Dry;Intact 01/12/2017  8:00 PM  Lumen #1 Status Flushed;Infusing 01/12/2017  8:00 PM  Lumen #2 Status Flushed 01/12/2017  8:00 PM  Dressing Type Transparent 01/12/2017  8:00 PM  Dressing Status Clean;Dry;Intact;Antimicrobial disc in place 01/12/2017  8:00 PM  Line Care Connections checked and tightened 01/12/2017  8:00 PM  Dressing Change Due 01/19/17 01/12/2017  8:00 PM     NG/OG Tube Orogastric 16 Fr. Center mouth (Active)  Site Assessment Clean;Dry;Intact 01/12/2017  8:00 PM  Ongoing Placement Verification No change in cm markings or external length of tube from initial placement;No change in respiratory status;No acute changes, not attributed to clinical condition 01/12/2017  8:00 PM  Status Infusing tube feed 01/12/2017  8:00 PM     ICP/Ventriculostomy ICP only via fiberoptic sensor-microsensor Right (Active)  Level Other (Comment) 01/12/2017  8:00 PM  Status To pressure monitoring 01/12/2017  8:00 PM  Site Assessment Clean;Dry 01/12/2017  8:00 PM  Dressing Status Clean;Dry;Intact 01/12/2017  8:00 PM    Microbiology/Sepsis markers: Results for orders placed or performed during the hospital encounter of 01/10/17  Blood Culture (routine x 2)     Status: None (Preliminary result)    Collection Time: 01/10/17 10:35 AM  Result Value Ref Range Status   Specimen Description BLOOD RIGHT ANTECUBITAL  Final   Special Requests BOTTLES DRAWN AEROBIC AND ANAEROBIC 10CC  Final   Culture NO GROWTH 2 DAYS  Final   Report Status PENDING  Incomplete  Blood Culture (routine x 2)     Status: None (Preliminary result)   Collection Time: 01/10/17 10:40 AM  Result Value Ref Range Status   Specimen Description BLOOD RIGHT HAND  Final   Special Requests BOTTLES DRAWN AEROBIC AND ANAEROBIC 5CC  Final   Culture NO GROWTH 2 DAYS  Final   Report Status PENDING  Incomplete  Urine culture     Status: None   Collection Time: 01/10/17 12:43 PM  Result Value Ref Range Status   Specimen Description URINE, CATHETERIZED  Final   Special Requests NONE  Final   Culture NO GROWTH  Final   Report Status 01/11/2017 FINAL  Final  MRSA PCR Screening     Status: None   Collection Time: 01/10/17  3:21 PM  Result Value Ref Range Status   MRSA by PCR NEGATIVE NEGATIVE Final    Comment:        The GeneXpert MRSA Assay (FDA approved for NASAL specimens only), is one component of a comprehensive MRSA colonization surveillance program. It is not intended to diagnose MRSA infection nor to guide or monitor treatment for MRSA infections.     Anti-infectives:  Anti-infectives    Start     Dose/Rate Route  Frequency Ordered Stop   01/10/17 1600  metroNIDAZOLE (FLAGYL) IVPB 500 mg     500 mg 100 mL/hr over 60 Minutes Intravenous  Once 01/10/17 1518 01/10/17 1629   01/10/17 1045  cefTRIAXone (ROCEPHIN) 1 g in dextrose 5 % 50 mL IVPB     1 g 100 mL/hr over 30 Minutes Intravenous  Once 01/10/17 1035 01/10/17 1248   01/10/17 1045  azithromycin (ZITHROMAX) 500 mg in dextrose 5 % 250 mL IVPB     500 mg 250 mL/hr over 60 Minutes Intravenous  Once 01/10/17 1035 01/10/17 1353   01/10/17 1045  metroNIDAZOLE (FLAGYL) IVPB 500 mg  Status:  Discontinued     500 mg 100 mL/hr over 60 Minutes Intravenous  Once  01/10/17 1035 01/10/17 1517      Best Practice/Protocols:  VTE Prophylaxis: Mechanical GI Prophylaxis: Proton Pump Inhibitor Continous Sedation Sedations being used to keep ICP down.  Consults: Treatment Team:  Tia Alert, MD    Events:  Subjective:    Overnight Issues: Spiked a fever to 101.5.  No distress.  Has been sedated  Objective:  Vital signs for last 24 hours: Temp:  [98.6 F (37 C)-101.5 F (38.6 C)] 98.9 F (37.2 C) (03/14 0804) Pulse Rate:  [63-72] 66 (03/14 0900) Resp:  [14-18] 15 (03/14 0900) BP: (105-141)/(60-80) 122/66 (03/14 0900) SpO2:  [97 %-100 %] 100 % (03/14 0900) FiO2 (%):  [30 %] 30 % (03/14 0804) Weight:  [72.4 kg (159 lb 9.8 oz)] 72.4 kg (159 lb 9.8 oz) (03/14 0500)  Hemodynamic parameters for last 24 hours:    Intake/Output from previous day: 03/13 0701 - 03/14 0700 In: 3138.7 [I.V.:2048.7; NG/GT:880; IV Piggyback:210] Out: 1950 [Urine:1950]  Intake/Output this shift: No intake/output data recorded.  Vent settings for last 24 hours: Vent Mode: PRVC FiO2 (%):  [30 %] 30 % Set Rate:  [15 bmp] 15 bmp Vt Set:  [450 mL] 450 mL PEEP:  [5 cmH20] 5 cmH20 Pressure Support:  [5 cmH20] 5 cmH20 Plateau Pressure:  [13 cmH20] 13 cmH20  Physical Exam:  General: no respiratory distress Neuro: nonfocal exam, RASS -1 and RASS -2 Resp: clear to auscultation bilaterally CVS: regular rate and rhythm, S1, S2 normal, no murmur, click, rub or gallop GI: soft, nontender, BS WNL, no r/g and tolerating tube feedings. Extremities: no edema, no erythema, pulses WNL  Results for orders placed or performed during the hospital encounter of 01/10/17 (from the past 24 hour(s))  Glucose, capillary     Status: Abnormal   Collection Time: 01/12/17 11:54 AM  Result Value Ref Range   Glucose-Capillary 210 (H) 65 - 99 mg/dL   Comment 1 Notify RN    Comment 2 Document in Chart   Magnesium     Status: None   Collection Time: 01/12/17  3:46 PM  Result Value  Ref Range   Magnesium 2.1 1.7 - 2.4 mg/dL  Phosphorus     Status: None   Collection Time: 01/12/17  3:46 PM  Result Value Ref Range   Phosphorus 2.5 2.5 - 4.6 mg/dL  Glucose, capillary     Status: Abnormal   Collection Time: 01/12/17  3:47 PM  Result Value Ref Range   Glucose-Capillary 168 (H) 65 - 99 mg/dL  Glucose, capillary     Status: Abnormal   Collection Time: 01/12/17  7:24 PM  Result Value Ref Range   Glucose-Capillary 177 (H) 65 - 99 mg/dL  Glucose, capillary     Status: Abnormal  Collection Time: 01/12/17 11:32 PM  Result Value Ref Range   Glucose-Capillary 176 (H) 65 - 99 mg/dL  Glucose, capillary     Status: Abnormal   Collection Time: 01/13/17  3:18 AM  Result Value Ref Range   Glucose-Capillary 181 (H) 65 - 99 mg/dL  Triglycerides     Status: Abnormal   Collection Time: 01/13/17  5:00 AM  Result Value Ref Range   Triglycerides 152 (H) <150 mg/dL  CBC     Status: Abnormal   Collection Time: 01/13/17  5:00 AM  Result Value Ref Range   WBC 6.7 4.0 - 10.5 K/uL   RBC 3.25 (L) 4.22 - 5.81 MIL/uL   Hemoglobin 9.9 (L) 13.0 - 17.0 g/dL   HCT 69.629.9 (L) 29.539.0 - 28.452.0 %   MCV 92.0 78.0 - 100.0 fL   MCH 30.5 26.0 - 34.0 pg   MCHC 33.1 30.0 - 36.0 g/dL   RDW 13.212.2 44.011.5 - 10.215.5 %   Platelets 125 (L) 150 - 400 K/uL  Basic metabolic panel     Status: Abnormal   Collection Time: 01/13/17  5:00 AM  Result Value Ref Range   Sodium 143 135 - 145 mmol/L   Potassium 3.9 3.5 - 5.1 mmol/L   Chloride 107 101 - 111 mmol/L   CO2 30 22 - 32 mmol/L   Glucose, Bld 190 (H) 65 - 99 mg/dL   BUN 15 6 - 20 mg/dL   Creatinine, Ser 7.250.59 (L) 0.61 - 1.24 mg/dL   Calcium 8.0 (L) 8.9 - 10.3 mg/dL   GFR calc non Af Amer >60 >60 mL/min   GFR calc Af Amer >60 >60 mL/min   Anion gap 6 5 - 15  Glucose, capillary     Status: Abnormal   Collection Time: 01/13/17  8:15 AM  Result Value Ref Range   Glucose-Capillary 196 (H) 65 - 99 mg/dL   Comment 1 Notify RN    Comment 2 Document in Chart       Assessment/Plan:   NEURO  Altered Mental Status:  sedation Trauma-CNS:  depressed level of consciousness, intracranial injury and increased intracranial pressure   Plan: Per neurosurgery, keep ICP monitoring,   PULM  No known issues, but sputum reported thick and purulent   Plan: Send respiratory culture.  Consider starting empiric antibiotics  CARDIO  No issues   Plan: CPM  RENAL  Urine output and renal function are good.   Plan: CPM  GI  No specific issues   Plan: Continue tube feedings.  ID  No known infectious problems   Plan: Consider empiric antibiotic  HEME  Anemia acute blood loss anemia)   Plan: No blood transfusion necessary  ENDO Diabetes Mellitus (Type II) and Hyperglycemia (stress related and diabetes)   Plan: CPM with control  Global Issues  Can start to wean the patient on the ventilator when neurosurgery wants to start to back down on sedation and consider removing ICP monitor.  Will send sputum for culture.  Start Zosyn empirically (patient is diabetic).  Check ABG    LOS: 3 days   Additional comments:I reviewed the patient's new clinical lab test results. cbc/bmet and I reviewed the patients new imaging test results. cxr  Critical Care Total Time*: 45 Minutes  Rad Gramling 01/13/2017  *Care during the described time interval was provided by me and/or other providers on the critical care team.  I have reviewed this patient's available data, including medical history, events of note, physical examination and  test results as part of my evaluation.

## 2017-01-13 NOTE — Anesthesia Preprocedure Evaluation (Signed)
Anesthesia Evaluation  Patient identified by MRN, date of birth, ID bandGeneral Assessment Comment:History noted. CGPreop documentation limited or incomplete due to emergent nature of procedure.  Airway Mallampati: Intubated       Dental   Pulmonary     + decreased breath sounds      Cardiovascular  Rhythm:Regular Rate:Normal     Neuro/Psych    GI/Hepatic   Endo/Other  diabetes  Renal/GU      Musculoskeletal   Abdominal   Peds  Hematology   Anesthesia Other Findings   Reproductive/Obstetrics                             Anesthesia Physical Anesthesia Plan  ASA: IV and emergent  Anesthesia Plan: General   Post-op Pain Management:    Induction: Intravenous  Airway Management Planned: Oral ETT  Additional Equipment:   Intra-op Plan:   Post-operative Plan: Post-operative intubation/ventilation  Informed Consent:   Plan Discussed with: CRNA, Anesthesiologist and Surgeon  Anesthesia Plan Comments:         Anesthesia Quick Evaluation

## 2017-01-13 NOTE — Progress Notes (Signed)
Patient ID: [name], male   DOB: 23-Sep-1966, 51 y.o.   MRN: [id number] BP 121/65   Pulse 73   Temp 98 F (36.7 C) (Axillary)   Resp 15   Ht 5\' 1"  (1.549 m)   Wt 72.4 kg (159 lb 9.8 oz)   SpO2 100%   BMI 30.16 kg/m  Patients CT shows increased right to left shift, now greater than 1cm. Basal cisterns now slightly effaced. Given the steady rise in his ICP not responsive to heavy sedation, and the evolution of the clots I have recommended that [name] undergo a craniotomy for hematoma evacuation and relief of elevated ICP.

## 2017-01-13 NOTE — Progress Notes (Signed)
Subjective: Patient continues to be intubated and sedated  Objective: Vital signs in last 24 hours: Temp:  [98.6 F (37 C)-101.5 F (38.6 C)] 98.6 F (37 C) (03/14 0400) Pulse Rate:  [63-72] 63 (03/14 0700) Resp:  [14-18] 15 (03/14 0700) BP: (105-141)/(60-80) 116/65 (03/14 0700) SpO2:  [97 %-100 %] 100 % (03/14 0804) FiO2 (%):  [30 %] 30 % (03/14 0804) Weight:  [72.4 kg (159 lb 9.8 oz)] 72.4 kg (159 lb 9.8 oz) (03/14 0500)  Intake/Output from previous day: 03/13 0701 - 03/14 0700 In: 3138.7 [I.V.:2048.7; NG/GT:880; IV Piggyback:210] Out: 1950 [Urine:1950] Intake/Output this shift: No intake/output data recorded.  Neurologic: Mental status: sedated Cranial nerves: I: sense of smell not tested, II: visual acuity not tested not tested, II: visual field normal, not tested, II: pupils equal, round, reactive to light and accommodation, III,VII: ptosis not tested, III,IV,VI: extraocular muscles not tested, V: mastication  , V: facial light touch sensation    , V,VII: corneal reflex    , VII: upper facial muscle function    , VII: lower facial muscle function    , VIII: hearing  , IX: soft palate elevation    , IX,X: gag reflex  , XI: trapezius strength    , XI: sternocleidomastoid strength    , XI: neck flexion strength  , XII: tongue strength    Sensory: normal, unable to examine Motor: no response to painful stimulus in all four extremities Reflexes: not tested Coordination: unable to examine Gait: unable to examine  Lab Results: Lab Results  Component Value Date   WBC 6.7 01/13/2017   HGB 9.9 (L) 01/13/2017   HCT 29.9 (L) 01/13/2017   MCV 92.0 01/13/2017   PLT 125 (L) 01/13/2017   Lab Results  Component Value Date   INR 1.04 01/10/2017   BMET Lab Results  Component Value Date   NA 143 01/13/2017   K 3.9 01/13/2017   CL 107 01/13/2017   CO2 30 01/13/2017   GLUCOSE 190 (H) 01/13/2017   BUN 15 01/13/2017   CREATININE 0.59 (L) 01/13/2017   CALCIUM 8.0 (L) 01/13/2017     Studies/Results: Dg Chest Port 1 View  Result Date: 01/13/2017 CLINICAL DATA:  Increased tracheal secretions. Intubated patient, subdural hemorrhage, diabetes. EXAM: PORTABLE CHEST 1 VIEW COMPARISON:  Portable chest x-ray of January 12, 2017 FINDINGS: There are increased densities in the left retrocardiac region in the right infrahilar region consistent with pneumonia. There is no pleural effusion or pneumothorax. The heart and pulmonary vascularity are normal. The endotracheal tube tip lies 3.7 cm above the carina. The esophagogastric tube tip in proximal port project below the inferior margin of the image. The right-sided PICC line tip projects over the distal third of the SVC. The observed bony thorax is unremarkable. IMPRESSION: Slight worsening of bilateral basilar pneumonia. The support tubes are in reasonable position. Electronically Signed   By: David  SwazilandJordan M.D.   On: 01/13/2017 07:52   Dg Chest Port 1 View  Result Date: 01/12/2017 CLINICAL DATA:  51 year old male post fall.  Subsequent encounter. EXAM: PORTABLE CHEST 1 VIEW COMPARISON:  01/10/2017. FINDINGS: Endotracheal tube tip 3.5 cm above the carina. Nasogastric tube courses into the stomach. Tip is not included on the present exam. Basilar subsegmental atelectasis greater on the left. Limited evaluation of the aorta by plain film exam. Heart size top-normal. IMPRESSION: Basilar subsegmental atelectasis greater on left. Electronically Signed   By: Lacy DuverneySteven  Olson M.D.   On: 01/12/2017 07:49  Assessment/Plan:  CT of head tomorrow morning. ICP's remain stable and less than 20. Possible DC of bolt tomorrow pending results of CT scan.    LOS: 3 days    Tiana Loft [facility] 01/13/2017, 8:34 AM

## 2017-01-14 ENCOUNTER — Encounter (HOSPITAL_COMMUNITY): Payer: Self-pay | Admitting: Neurosurgery

## 2017-01-14 DIAGNOSIS — I619 Nontraumatic intracerebral hemorrhage, unspecified: Secondary | ICD-10-CM | POA: Diagnosis present

## 2017-01-14 LAB — CBC WITH DIFFERENTIAL/PLATELET
BASOS ABS: 0 10*3/uL (ref 0.0–0.1)
BASOS PCT: 0 %
EOS ABS: 0 10*3/uL (ref 0.0–0.7)
EOS PCT: 0 %
HCT: 25.5 % — ABNORMAL LOW (ref 39.0–52.0)
Hemoglobin: 9 g/dL — ABNORMAL LOW (ref 13.0–17.0)
LYMPHS PCT: 9 %
Lymphs Abs: 0.5 10*3/uL — ABNORMAL LOW (ref 0.7–4.0)
MCH: 31.8 pg (ref 26.0–34.0)
MCHC: 35.3 g/dL (ref 30.0–36.0)
MCV: 90.1 fL (ref 78.0–100.0)
MONO ABS: 0.4 10*3/uL (ref 0.1–1.0)
Monocytes Relative: 6 %
Neutro Abs: 4.9 10*3/uL (ref 1.7–7.7)
Neutrophils Relative %: 85 %
PLATELETS: 135 10*3/uL — AB (ref 150–400)
RBC: 2.83 MIL/uL — AB (ref 4.22–5.81)
RDW: 12.1 % (ref 11.5–15.5)
WBC: 5.8 10*3/uL (ref 4.0–10.5)

## 2017-01-14 LAB — GLUCOSE, CAPILLARY
GLUCOSE-CAPILLARY: 184 mg/dL — AB (ref 65–99)
GLUCOSE-CAPILLARY: 202 mg/dL — AB (ref 65–99)
GLUCOSE-CAPILLARY: 294 mg/dL — AB (ref 65–99)
Glucose-Capillary: 164 mg/dL — ABNORMAL HIGH (ref 65–99)
Glucose-Capillary: 199 mg/dL — ABNORMAL HIGH (ref 65–99)
Glucose-Capillary: 203 mg/dL — ABNORMAL HIGH (ref 65–99)

## 2017-01-14 LAB — BLOOD GAS, ARTERIAL
ACID-BASE EXCESS: 7.7 mmol/L — AB (ref 0.0–2.0)
Acid-Base Excess: 6.5 mmol/L — ABNORMAL HIGH (ref 0.0–2.0)
Bicarbonate: 31.2 mmol/L — ABNORMAL HIGH (ref 20.0–28.0)
Bicarbonate: 31.5 mmol/L — ABNORMAL HIGH (ref 20.0–28.0)
Drawn by: 30136
Drawn by: 301361
FIO2: 30
FIO2: 30
LHR: 15 {breaths}/min
O2 SAT: 96.5 %
O2 Saturation: 96.9 %
PCO2 ART: 43.1 mmHg (ref 32.0–48.0)
PEEP: 5 cmH2O
PEEP: 5 cmH2O
PH ART: 7.477 — AB (ref 7.350–7.450)
PO2 ART: 89.9 mmHg (ref 83.0–108.0)
Patient temperature: 98.6
Patient temperature: 98.6
RATE: 20 resp/min
VT: 450 mL
VT: 450 mL
pCO2 arterial: 50.8 mmHg — ABNORMAL HIGH (ref 32.0–48.0)
pH, Arterial: 7.405 (ref 7.350–7.450)
pO2, Arterial: 82.2 mmHg — ABNORMAL LOW (ref 83.0–108.0)

## 2017-01-14 LAB — BASIC METABOLIC PANEL
ANION GAP: 3 — AB (ref 5–15)
BUN: 13 mg/dL (ref 6–20)
CALCIUM: 7.5 mg/dL — AB (ref 8.9–10.3)
CO2: 31 mmol/L (ref 22–32)
Chloride: 107 mmol/L (ref 101–111)
Creatinine, Ser: 0.5 mg/dL — ABNORMAL LOW (ref 0.61–1.24)
GFR calc Af Amer: >60 mL/min (ref >60)
GLUCOSE: 187 mg/dL — AB (ref 65–99)
POTASSIUM: 3.6 mmol/L (ref 3.5–5.1)
Sodium: 141 mmol/L (ref 135–145)

## 2017-01-14 MED ORDER — LIDOCAINE-EPINEPHRINE (PF) 2 %-1:200000 IJ SOLN
INTRAMUSCULAR | Status: DC | PRN
  Administered 2017-01-14: 10 mL via INTRADERMAL

## 2017-01-14 MED ORDER — LORAZEPAM 2 MG/ML IJ SOLN
1.0000 mg | INTRAMUSCULAR | Status: DC | PRN
  Administered 2017-01-14: 2 mg via INTRAVENOUS
  Filled 2017-01-14: qty 1

## 2017-01-14 MED ORDER — PROMETHAZINE HCL 12.5 MG PO TABS
12.5000 mg | ORAL_TABLET | ORAL | Status: DC | PRN
  Filled 2017-01-14: qty 2

## 2017-01-14 MED ORDER — SODIUM CHLORIDE 0.9 % IV SOLN
500.0000 mg | Freq: Once | INTRAVENOUS | Status: AC
  Administered 2017-01-14: 500 mg via INTRAVENOUS
  Filled 2017-01-14: qty 5

## 2017-01-14 MED ORDER — ONDANSETRON HCL 4 MG/2ML IJ SOLN: 4.0000 mg | INTRAMUSCULAR | Status: DC | PRN

## 2017-01-14 MED ORDER — MORPHINE SULFATE (PF) 2 MG/ML IV SOLN: 1.0000 mg | INTRAVENOUS | Status: DC | PRN

## 2017-01-14 MED ORDER — MICROFIBRILLAR COLL HEMOSTAT EX PADS
MEDICATED_PAD | CUTANEOUS | Status: DC | PRN
  Administered 2017-01-14: 1 via TOPICAL

## 2017-01-14 MED ORDER — 0.9 % SODIUM CHLORIDE (POUR BTL) OPTIME
TOPICAL | Status: DC | PRN
  Administered 2017-01-14 (×2): 1000 mL

## 2017-01-14 MED ORDER — ROCURONIUM BROMIDE 50 MG/5ML IV SOSY
PREFILLED_SYRINGE | INTRAVENOUS | Status: AC
  Filled 2017-01-14: qty 5

## 2017-01-14 MED ORDER — NALOXONE HCL 0.4 MG/ML IJ SOLN: 0.0800 mg | INTRAMUSCULAR | Status: DC | PRN

## 2017-01-14 MED ORDER — PROPOFOL 1000 MG/100ML IV EMUL: 5.0000 ug/kg/min | INTRAVENOUS | Status: DC

## 2017-01-14 MED ORDER — HYDROCODONE-ACETAMINOPHEN 5-325 MG PO TABS: 1.0000 | ORAL_TABLET | ORAL | Status: DC | PRN

## 2017-01-14 MED ORDER — LORAZEPAM 2 MG/ML IJ SOLN
INTRAMUSCULAR | Status: AC
  Filled 2017-01-14: qty 1

## 2017-01-14 MED ORDER — LACTATED RINGERS IV SOLN
INTRAVENOUS | Status: DC | PRN
  Administered 2017-01-13: 23:00:00 via INTRAVENOUS

## 2017-01-14 MED ORDER — LABETALOL HCL 5 MG/ML IV SOLN: 10.0000 mg | INTRAVENOUS | Status: DC | PRN

## 2017-01-14 MED ORDER — HEPARIN SODIUM (PORCINE) 5000 UNIT/ML IJ SOLN
5000.0000 [IU] | Freq: Three times a day (TID) | INTRAMUSCULAR | Status: DC
  Administered 2017-01-16 – 2017-02-01 (×50): 5000 [IU] via SUBCUTANEOUS
  Filled 2017-01-14 (×50): qty 1

## 2017-01-14 MED ORDER — THROMBIN 20000 UNITS EX SOLR
CUTANEOUS | Status: DC | PRN
  Administered 2017-01-14: 20 mL via TOPICAL

## 2017-01-14 MED ORDER — ONDANSETRON HCL 4 MG PO TABS: 4.0000 mg | ORAL_TABLET | ORAL | Status: DC | PRN

## 2017-01-14 NOTE — Anesthesia Postprocedure Evaluation (Signed)
Anesthesia Post Note  Patient: [name]  Procedure(s) Performed: Procedure(s) (LRB): 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, Placement of bone flap in abdomen (Right)  Patient location during evaluation: PACU Anesthesia Type: General Level of consciousness: awake Pain management: pain level controlled Vital Signs Assessment: post-procedure vital signs reviewed and stable Respiratory status: spontaneous breathing Cardiovascular status: stable Anesthetic complications: no       Last Vitals:  Vitals:   01/14/17 0630 01/14/17 0700  BP: 134/73 135/73  Pulse: 86 87  Resp: 15 15  Temp:      Last Pain:  Vitals:   01/14/17 0400  TempSrc: Axillary                 Atul Delucia

## 2017-01-14 NOTE — Progress Notes (Signed)
Dr. Franky Machoabbell called and notified of seizure-like activity. Patient had slight twitching to right neck, right face, and right upper extremity. Order placed for Ativan 1-2 mg q20 minutes until seizing stops. Per MD also wanted patient to receive an additional 500mg  of Keppra. Will continue to monitor patient.

## 2017-01-14 NOTE — Progress Notes (Signed)
Patient transported to OR for emergent crani without complications. Hand off given to CRNA.

## 2017-01-14 NOTE — Progress Notes (Signed)
Patient ID: Loni BeckwithMiguel Garcia Cardenas, male   DOB: Dec 23, 1965, 51 y.o.   MRN: [id number] I updated his sister at the bedside.  [name] Davonte Siebenaler, MD, MPH, FACS Trauma: [phone] General Surgery: [phone]

## 2017-01-14 NOTE — Progress Notes (Signed)
Patient transported from 3M10 to OR without any complications.

## 2017-01-14 NOTE — Transfer of Care (Signed)
Immediate Anesthesia Transfer of Care Note  Patient: [name]  Procedure(s) Performed: Procedure(s): 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, Placement of bone flap in abdomen (Right)  Patient Location: SICU  Anesthesia Type:General  Level of Consciousness: Patient remains intubated per anesthesia plan  Airway & Oxygen Therapy: Patient remains intubated per anesthesia plan and Patient placed on Ventilator (see vital sign flow sheet for setting)  Post-op Assessment: Report given to RN and Post -op Vital signs reviewed and stable  Post vital signs: Reviewed and stable  Last Vitals:  Vitals:   01/13/17 2200 01/13/17 2300  BP: (!) 112/54 (!) 93/51  Pulse: 75 68  Resp: 15 15  Temp:      Last Pain:  Vitals:   01/13/17 2000  TempSrc: Axillary         Complications: No apparent anesthesia complications

## 2017-01-14 NOTE — Op Note (Signed)
01/10/2017 - 01/14/2017  2:11 AM  PATIENT:  [name]  51 y.o. male whom sustained a traumatic head injury with right temporal and frontal intracerebral hematomas with increasing and refractory ICP.  PRE-OPERATIVE DIAGNOSIS: right frontal temporal Intracerebral hematomas, ELEVATED ICP  POST-OPERATIVE DIAGNOSIS: right frontal and temporal Intracerebral hematomas, ELEVATED ICP  PROCEDURE:  Procedure(s): 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, Placement of bone flap in abdomen  SURGEON: Surgeon(s): Coletta MemosKyle Halla Chopp, MD  ASSISTANTS:none  ANESTHESIA:   general  EBL:  Total I/O In: 1395 [I.V.:1285; NG/GT:110] Out: 500 [Urine:400; Blood:100]  BLOOD ADMINISTERED:none  CELL SAVER GIVEN:none  COUNT:per nursing  DRAINS: none   SPECIMEN:  No Specimen  DICTATION: [name] was taken to the operating room already intubated, and placed under a general anesthetic without difficulty. He was positioned supine on the operating table.Once adequate anesthesia was obtained I placed a three pin Mayfield head holder on his head. I then attached the Mayfield head holder to the bed and secured him with the [facility] [place] system. His head was shaved, prepped and draped finally in a sterile manner. I infiltrated the reverse question mark planned incision with lidocaine. I opened the scalp with a 10 blade and dissected sharply to fully open the scalp. I placed Raney clips around the scalp edges to control bleeding. I reflected the temporalis muscle together with the scalp to expose the frontotemporoparietal region of the skull. I drilled burr holes, connected them with the craniotome, and raised a bone flap exposing the frontal temporal and parietal regions. I immediately opened the dura basing the flap rostrally. I encountered dark clotted blood. I used the suction to remove the temporal tip which had been pulped by the injury. I also used the suction to remove  involved frontal lobe over the planum sphenoidale and orbital roof, and along the sylvian fissure. I used irrigation, cotton balls, and other hemostatic agents to achieve hemostasis. Once accomplished I irrigated , then subsequently closed. I chose not to replace the bone flap due to cerebral swelling. I covered the brain with native dura and a dural substitute. I then approximated the temporalis muscle loosely with vicryl sutures, I approximated the galea with vicryl sutures, and finally the scalp edges with staples.  The abdomen had also been prepped and draped in case I needed to place the bone flap in a subcutaneous pocket. I opened the skin with a 10 blade, and developed a subcutaneous pocket with cautery to hold the bone. I placed the bone flap in the pocket, then closed the incision. I approximated the wound with subcutaneous, and subcuticular sutures. I used dermabond for a sterile dressing on the abdominal wound. I wrapped the cranial incision with gauze to dress the incision. [name] was taken directly to the ICU for his postoperative recovery.     PLAN OF CARE: Admit to inpatient   PATIENT DISPOSITION:  PACU - hemodynamically stable.   Delay start of Pharmacological VTE agent (>24hrs) due to surgical blood loss or risk of bleeding:  yes

## 2017-01-14 NOTE — Progress Notes (Signed)
Dr. Franky Machoabbell paged and notified that patient's ICP is staying at 23-24 after receiving maximum sedation. No change in pupillary response or neuro status. Dr. Franky Machoabbell ordered STAT head CT. CT completed without complications. Plan per Dr. Franky Machoabbell is to take patient to OR for crani and evacuation of hematoma. Dr. Franky Machoabbell at bedside talking with sister and other family members. CHG bath performed prior to transporting patient to OR.

## 2017-01-14 NOTE — Progress Notes (Addendum)
Follow up - Trauma Critical Care  Patient Details:    [name] is an 51 y.o. male.  Lines/tubes : Airway 8 mm (Active)  Secured at (cm) 22 cm 01/14/2017  7:53 AM  Measured From Lips 01/14/2017  7:53 AM  Secured Location Right 01/14/2017  7:53 AM  Secured By [employer] 01/14/2017  7:53 AM  Tube Holder Repositioned Yes 01/14/2017  7:53 AM  Cuff Pressure (cm H2O) 24 cm H2O 01/14/2017  2:56 AM  Site Condition Dry 01/14/2017  7:53 AM     PICC Double Lumen 01/12/17 PICC Right Basilic 42 cm 0 cm (Active)  Indication for Insertion or Continuance of Line Prolonged intravenous therapies 01/13/2017  8:00 PM  Exposed Catheter (cm) 0 cm 01/12/2017  3:28 PM  Site Assessment Clean;Dry;Intact 01/13/2017  8:00 PM  Lumen #1 Status Infusing 01/13/2017  8:00 PM  Lumen #2 Status Flushed;Other (Comment) 01/13/2017  8:00 PM  Dressing Type Other (Comment) 01/13/2017  8:00 PM  Dressing Status Clean;Dry;Intact;Antimicrobial disc in place 01/13/2017  8:00 PM  Line Care Connections checked and tightened 01/13/2017  8:00 PM  Dressing Change Due 01/19/17 01/13/2017  8:00 PM     Arterial Line 01/13/17 Left Radial (Active)  Site Assessment Clean;Dry;Intact 01/14/2017  4:00 AM  Line Status Pulsatile blood flow 01/14/2017  4:00 AM  Art Line Waveform Whip 01/14/2017  4:00 AM  Art Line Interventions Zeroed and calibrated;Leveled;Connections checked and tightened;Flushed per protocol;Line pulled back 01/14/2017  4:00 AM  Color/Movement/Sensation Capillary refill less than 3 sec 01/14/2017  4:00 AM  Dressing Type Occlusive;Transparent 01/14/2017  4:00 AM  Dressing Status Clean;Dry;Intact 01/14/2017  4:00 AM     NG/OG Tube Orogastric 16 Fr. Center mouth (Active)  Site Assessment Clean;Dry;Intact 01/13/2017  8:00 PM  Ongoing Placement Verification No change in cm markings or external length of tube from initial placement;No change in respiratory status;No acute changes, not attributed to clinical condition  01/13/2017  8:00 PM  Status Infusing tube feed 01/13/2017  8:00 PM     Urethral Catheter Junious Dresser Dupont Latex 16 Fr. (Active)  Indication for Insertion or Continuance of Catheter Acute urinary retention 01/13/2017  8:00 PM  Site Assessment Clean;Intact 01/13/2017  8:00 PM  Catheter Maintenance Bag below level of bladder;Catheter secured;Drainage bag/tubing not touching floor;Seal intact;No dependent loops;Insertion date on drainage bag;Bag emptied prior to transport 01/13/2017  8:00 PM  Collection Container Standard drainage bag 01/13/2017  8:00 PM  Securement Method Securing device (Describe) 01/13/2017  8:00 PM  Urinary Catheter Interventions Unclamped 01/13/2017 12:12 PM  Output (mL) 525 mL 01/14/2017  6:16 AM     ICP/Ventriculostomy ICP only via fiberoptic sensor-microsensor Right (Active)  Level Other (Comment) 01/13/2017  8:00 PM  Status To pressure monitoring 01/13/2017  8:00 PM  Site Assessment Clean;Dry 01/13/2017  8:00 PM  Dressing Status Clean;Dry;Intact 01/13/2017  8:00 PM    Microbiology/Sepsis markers: Results for orders placed or performed during the hospital encounter of 01/10/17  Blood Culture (routine x 2)     Status: None (Preliminary result)   Collection Time: 01/10/17 10:35 AM  Result Value Ref Range Status   Specimen Description BLOOD RIGHT ANTECUBITAL  Final   Special Requests BOTTLES DRAWN AEROBIC AND ANAEROBIC 10CC  Final   Culture NO GROWTH 3 DAYS  Final   Report Status PENDING  Incomplete  Blood Culture (routine x 2)     Status: None (Preliminary result)   Collection Time: 01/10/17 10:40 AM  Result Value Ref Range Status  Specimen Description BLOOD RIGHT HAND  Final   Special Requests BOTTLES DRAWN AEROBIC AND ANAEROBIC 5CC  Final   Culture NO GROWTH 3 DAYS  Final   Report Status PENDING  Incomplete  Urine culture     Status: None   Collection Time: 01/10/17 12:43 PM  Result Value Ref Range Status   Specimen Description URINE, CATHETERIZED  Final   Special  Requests NONE  Final   Culture NO GROWTH  Final   Report Status 01/11/2017 FINAL  Final  MRSA PCR Screening     Status: None   Collection Time: 01/10/17  3:21 PM  Result Value Ref Range Status   MRSA by PCR NEGATIVE NEGATIVE Final    Comment:        The GeneXpert MRSA Assay (FDA approved for NASAL specimens only), is one component of a comprehensive MRSA colonization surveillance program. It is not intended to diagnose MRSA infection nor to guide or monitor treatment for MRSA infections.   Culture, respiratory (NON-Expectorated)     Status: None (Preliminary result)   Collection Time: 01/13/17 11:17 AM  Result Value Ref Range Status   Specimen Description TRACHEAL ASPIRATE  Final   Special Requests Normal  Final   Gram Stain   Final    MODERATE WBC PRESENT, PREDOMINANTLY PMN FEW SQUAMOUS EPITHELIAL CELLS PRESENT MODERATE GRAM POSITIVE COCCI IN PAIRS FEW GRAM NEGATIVE COCCOBACILLI FEW GRAM POSITIVE RODS    Culture PENDING  Incomplete   Report Status PENDING  Incomplete    Anti-infectives:  Anti-infectives    Start     Dose/Rate Route Frequency Ordered Stop   01/13/17 1830  piperacillin-tazobactam (ZOSYN) IVPB 3.375 g     3.375 g 12.5 mL/hr over 240 Minutes Intravenous Every 8 hours 01/13/17 1016     01/13/17 1030  piperacillin-tazobactam (ZOSYN) IVPB 3.375 g     3.375 g 100 mL/hr over 30 Minutes Intravenous  Once 01/13/17 1016 01/13/17 1125   01/10/17 1600  metroNIDAZOLE (FLAGYL) IVPB 500 mg     500 mg 100 mL/hr over 60 Minutes Intravenous  Once 01/10/17 1518 01/10/17 1629   01/10/17 1045  cefTRIAXone (ROCEPHIN) 1 g in dextrose 5 % 50 mL IVPB     1 g 100 mL/hr over 30 Minutes Intravenous  Once 01/10/17 1035 01/10/17 1248   01/10/17 1045  azithromycin (ZITHROMAX) 500 mg in dextrose 5 % 250 mL IVPB     500 mg 250 mL/hr over 60 Minutes Intravenous  Once 01/10/17 1035 01/10/17 1353   01/10/17 1045  metroNIDAZOLE (FLAGYL) IVPB 500 mg  Status:  Discontinued     500  mg 100 mL/hr over 60 Minutes Intravenous  Once 01/10/17 1035 01/10/17 1517      Best Practice/Protocols:  VTE Prophylaxis: Mechanical Continous Sedation  Consults: Treatment Team:  Tia Alertavid S Jones, MD   Subjective:    Overnight Issues:  craniectomy Objective:  Vital signs for last 24 hours: Temp:  [97.6 F (36.4 C)-99 F (37.2 C)] 97.6 F (36.4 C) (03/15 0400) Pulse Rate:  [61-96] 89 (03/15 0753) Resp:  [14-16] 15 (03/15 0753) BP: (93-176)/(51-81) 176/68 (03/15 0753) SpO2:  [96 %-100 %] 99 % (03/15 0753) Arterial Line BP: (148-181)/(62-69) 181/69 (03/15 0700) FiO2 (%):  [30 %] 30 % (03/15 0753) Weight:  [73 kg (160 lb 15 oz)] 73 kg (160 lb 15 oz) (03/15 0500)  Hemodynamic parameters for last 24 hours:    Intake/Output from previous day: 03/14 0701 - 03/15 0700 In: 3968.8 [I.V.:3194.5; NG/GT:619.3; IV  Piggyback:155] Out: 2310 [Urine:2210; Blood:100]  Intake/Output this shift: No intake/output data recorded.  Vent settings for last 24 hours: Vent Mode: PRVC FiO2 (%):  [30 %] 30 % Set Rate:  [15 bmp] 15 bmp Vt Set:  [450 mL] 450 mL PEEP:  [5 cmH20] 5 cmH20 Plateau Pressure:  [12 cmH20-14 cmH20] 12 cmH20  Physical Exam:  General: on vent Neuro: PERL, hard to open R eye, no movement to pain but sedated HEENT/Neck: ETT and dressing on head Resp: clear to auscultation bilaterally CVS: RRR GI: soft, NT, ND    Flap Results for orders placed or performed during the hospital encounter of 01/10/17 (from the past 24 hour(s))  Glucose, capillary     Status: Abnormal   Collection Time: 01/13/17  8:15 AM  Result Value Ref Range   Glucose-Capillary 196 (H) 65 - 99 mg/dL   Comment 1 Notify RN    Comment 2 Document in Chart   Culture, respiratory (NON-Expectorated)     Status: None (Preliminary result)   Collection Time: 01/13/17 11:17 AM  Result Value Ref Range   Specimen Description TRACHEAL ASPIRATE    Special Requests Normal    Gram Stain      MODERATE WBC  PRESENT, PREDOMINANTLY PMN FEW SQUAMOUS EPITHELIAL CELLS PRESENT MODERATE GRAM POSITIVE COCCI IN PAIRS FEW GRAM NEGATIVE COCCOBACILLI FEW GRAM POSITIVE RODS    Culture PENDING    Report Status PENDING   Glucose, capillary     Status: Abnormal   Collection Time: 01/13/17 11:34 AM  Result Value Ref Range   Glucose-Capillary 182 (H) 65 - 99 mg/dL  Blood gas, arterial     Status: Abnormal   Collection Time: 01/13/17 11:35 AM  Result Value Ref Range   FIO2 30.00    Delivery systems VENTILATOR    Mode PRESSURE REGULATED VOLUME CONTROL    VT 450 mL   LHR 15.0 resp/min   Peep/cpap 5.0 cm H20   pH, Arterial 7.377 7.350 - 7.450   pCO2 arterial 52.5 (H) 32.0 - 48.0 mmHg   pO2, Arterial 97.0 83.0 - 108.0 mmHg   Bicarbonate 30.1 (H) 20.0 - 28.0 mmol/L   Acid-Base Excess 5.2 (H) 0.0 - 2.0 mmol/L   O2 Saturation 97.6 %   Patient temperature 98.9    Collection site LEFT RADIAL    Sample type ARTERIAL DRAW    Allens test (pass/fail) PASS PASS  Glucose, capillary     Status: Abnormal   Collection Time: 01/13/17  4:08 PM  Result Value Ref Range   Glucose-Capillary 186 (H) 65 - 99 mg/dL  Glucose, capillary     Status: Abnormal   Collection Time: 01/13/17  7:28 PM  Result Value Ref Range   Glucose-Capillary 208 (H) 65 - 99 mg/dL  Glucose, capillary     Status: Abnormal   Collection Time: 01/14/17  2:04 AM  Result Value Ref Range   Glucose-Capillary 202 (H) 65 - 99 mg/dL  CBC with Differential/Platelet     Status: Abnormal   Collection Time: 01/14/17  4:00 AM  Result Value Ref Range   WBC 5.8 4.0 - 10.5 K/uL   RBC 2.83 (L) 4.22 - 5.81 MIL/uL   Hemoglobin 9.0 (L) 13.0 - 17.0 g/dL   HCT 96.2 (L) 95.2 - 84.1 %   MCV 90.1 78.0 - 100.0 fL   MCH 31.8 26.0 - 34.0 pg   MCHC 35.3 30.0 - 36.0 g/dL   RDW 32.4 40.1 - 02.7 %   Platelets 135 (L) 150 -  400 K/uL   Neutrophils Relative % 85 %   Neutro Abs 4.9 1.7 - 7.7 K/uL   Lymphocytes Relative 9 %   Lymphs Abs 0.5 (L) 0.7 - 4.0 K/uL    Monocytes Relative 6 %   Monocytes Absolute 0.4 0.1 - 1.0 K/uL   Eosinophils Relative 0 %   Eosinophils Absolute 0.0 0.0 - 0.7 K/uL   Basophils Relative 0 %   Basophils Absolute 0.0 0.0 - 0.1 K/uL  Basic metabolic panel     Status: Abnormal   Collection Time: 01/14/17  4:00 AM  Result Value Ref Range   Sodium 141 135 - 145 mmol/L   Potassium 3.6 3.5 - 5.1 mmol/L   Chloride 107 101 - 111 mmol/L   CO2 31 22 - 32 mmol/L   Glucose, Bld 187 (H) 65 - 99 mg/dL   BUN 13 6 - 20 mg/dL   Creatinine, Ser 1.61 (L) 0.61 - 1.24 mg/dL   Calcium 7.5 (L) 8.9 - 10.3 mg/dL   GFR calc non Af Amer >60 >60 mL/min   GFR calc Af Amer >60 >60 mL/min   Anion gap 3 (L) 5 - 15  Glucose, capillary     Status: Abnormal   Collection Time: 01/14/17  8:11 AM  Result Value Ref Range   Glucose-Capillary 164 (H) 65 - 99 mg/dL    Assessment & Plan: Present on Admission: . Fall . Subdural hemorrhage (HCC) . Intracerebral hemorrhage (HCC)    LOS: 4 days   Additional comments:I reviewed the patient's new clinical lab test results. . Fall TBI/L SDH/B ICC/IVH - ICPs remained high last night, went for decompressive craniectomy by Dr. Franky Macho, will keep sedated today Vent dependent resp failure - hold off on weaning today, check ABG now DM/Hyperglycemia - SSI, Lantus 15u QD ID - Zosyn empiric, resp CX P FEN - TF, Na OK ABL anemia VTE - SQ hep DIspo - ICU Critical Care Total Time*: 16 Minutes  [name], MD, MPH, FACS Trauma: [phone] General Surgery: [phone]  01/14/2017  *Care during the described time interval was provided by me. I have reviewed this patient's available data, including medical history, events of note, physical examination and test results as part of my evaluation.  Patient ID: [name], male   DOB: Apr 09, 1966, 52 y.o.   MRN: [id number]

## 2017-01-14 NOTE — Progress Notes (Signed)
Patient ID: [name], male   DOB: 02-14-1966, 51 y.o.   MRN: [id number] Subjective: Patient remains intubated and sedated, no exam available  Objective: Vital signs in last 24 hours: Temp:  [97.6 F (36.4 C)-101.5 F (38.6 C)] 99.7 F (37.6 C) (03/15 1600) Pulse Rate:  [67-101] 88 (03/15 1900) Resp:  [13-20] 20 (03/15 1900) BP: (93-176)/(51-81) 137/75 (03/15 1900) SpO2:  [95 %-100 %] 98 % (03/15 1900) Arterial Line BP: (146-186)/(62-94) 171/66 (03/15 1900) FiO2 (%):  [30 %] 30 % (03/15 1624) Weight:  [73 kg (160 lb 15 oz)] 73 kg (160 lb 15 oz) (03/15 0500)  Intake/Output from previous day: 03/14 0701 - 03/15 0700 In: 3968.8 [I.V.:3194.5; NG/GT:619.3; IV Piggyback:155] Out: 2310 [Urine:2210; Blood:100] Intake/Output this shift: No intake/output data recorded.  no eaxm available  Lab Results: Lab Results  Component Value Date   WBC 5.8 01/14/2017   HGB 9.0 (L) 01/14/2017   HCT 25.5 (L) 01/14/2017   MCV 90.1 01/14/2017   PLT 135 (L) 01/14/2017   Lab Results  Component Value Date   INR 1.04 01/10/2017   BMET Lab Results  Component Value Date   NA 141 01/14/2017   K 3.6 01/14/2017   CL 107 01/14/2017   CO2 31 01/14/2017   GLUCOSE 187 (H) 01/14/2017   BUN 13 01/14/2017   CREATININE 0.50 (L) 01/14/2017   CALCIUM 7.5 (L) 01/14/2017    Studies/Results: Ct Head Wo Contrast  Result Date: 01/13/2017 CLINICAL DATA:  Neurologic abnormalities, followup traumatic brain injury. History of diabetes. EXAM: CT HEAD WITHOUT CONTRAST TECHNIQUE: Contiguous axial images were obtained from the base of the skull through the vertex without intravenous contrast. COMPARISON:  CT HEAD January 11, 2017 FINDINGS: BRAIN: New RIGHT frontal burr hole and ICP monitor terminating in RIGHT frontal lobe. No hemorrhage about the monitor. Large confluent RIGHT frontotemporal hemorrhagic contusions with extensive surrounding vasogenic edema effacing the RIGHT lateral ventricle, resulting an  11 mm RIGHT to LEFT midline shift, previously 8 mm. Mild LEFT lateral ventricle entrapment, and new from prior examination. Similar dependent LEFT occipital horn blood products. Evolving small LEFT temporal hemorrhagic contusion. Moderate scattered subarachnoid hemorrhage. 3 mm RIGHT holo hemispheric subdural hematoma similar. Global edema and sulcal effacement, effaced basal cisterns, worse than prior examination. VASCULAR: Unremarkable. SKULL/SOFT TISSUES: LEFT scalp hematomas. Re- demonstration of LEFT parietotemporal skull fracture. ORBITS/SINUSES: The included ocular globes and orbital contents are normal.Blood products LEFT middle ear, mastoid air cells and external auditory canal. OTHER: Life-support lines in place. IMPRESSION: Evolving large RIGHT frontotemporal hemorrhagic contusions. Increasing 11 mm RIGHT to LEFT midline shift with new LEFT lateral ventricle entrapment. Worsening global edema.  Small LEFT temporal hemorrhagic contusion. New RIGHT frontal ICP monitor. Stable small RIGHT subdural hematoma and scattered subarachnoid hemorrhage. These results will be called to the ordering clinician or representative by the Radiologist Assistant, and communication documented in the [facility] Dashboard. Electronically Signed   By: Awilda Metro M.D.   On: 01/13/2017 22:27   Dg Chest Port 1 View  Result Date: 01/13/2017 CLINICAL DATA:  Increased tracheal secretions. Intubated patient, subdural hemorrhage, diabetes. EXAM: PORTABLE CHEST 1 VIEW COMPARISON:  Portable chest x-ray of January 12, 2017 FINDINGS: There are increased densities in the left retrocardiac region in the right infrahilar region consistent with pneumonia. There is no pleural effusion or pneumothorax. The heart and pulmonary vascularity are normal. The endotracheal tube tip lies 3.7 cm above the carina. The esophagogastric tube tip in proximal port project below the inferior margin  of the image. The right-sided PICC line tip projects over  the distal third of the SVC. The observed bony thorax is unremarkable. IMPRESSION: Slight worsening of bilateral basilar pneumonia. The support tubes are in reasonable position. Electronically Signed   By: Elaria Osias  [place] M.D.   On: 01/13/2017 07:52    Assessment/Plan: Events of last night noted, appreciate Dr Sueanne Margaritaabbell's assistance/ expertise on call. Continue supportive care   LOS: 4 days    Santia Labate S 01/14/2017, 7:15 PM

## 2017-01-15 ENCOUNTER — Inpatient Hospital Stay (HOSPITAL_COMMUNITY): Payer: Medicaid Other

## 2017-01-15 LAB — BLOOD GAS, ARTERIAL
ACID-BASE EXCESS: 6.9 mmol/L — AB (ref 0.0–2.0)
Bicarbonate: 30.6 mmol/L — ABNORMAL HIGH (ref 20.0–28.0)
DRAWN BY: 301361
FIO2: 30
LHR: 20 {breaths}/min
MECHVT: 450 mL
O2 SAT: 96 %
PATIENT TEMPERATURE: 99.8
PCO2 ART: 43.1 mmHg (ref 32.0–48.0)
PEEP: 5 cmH2O
PH ART: 7.469 — AB (ref 7.350–7.450)
PO2 ART: 80 mmHg — AB (ref 83.0–108.0)

## 2017-01-15 LAB — CULTURE, BLOOD (ROUTINE X 2)
CULTURE: NO GROWTH
Culture: NO GROWTH

## 2017-01-15 LAB — BASIC METABOLIC PANEL
Anion gap: 5 (ref 5–15)
BUN: 13 mg/dL (ref 6–20)
CALCIUM: 7.5 mg/dL — AB (ref 8.9–10.3)
CO2: 31 mmol/L (ref 22–32)
CREATININE: 0.5 mg/dL — AB (ref 0.61–1.24)
Chloride: 102 mmol/L (ref 101–111)
GFR calc Af Amer: >60 mL/min (ref >60)
GFR calc non Af Amer: >60 mL/min (ref >60)
GLUCOSE: 184 mg/dL — AB (ref 65–99)
Potassium: 3.4 mmol/L — ABNORMAL LOW (ref 3.5–5.1)
Sodium: 138 mmol/L (ref 135–145)

## 2017-01-15 LAB — GLUCOSE, CAPILLARY
GLUCOSE-CAPILLARY: 192 mg/dL — AB (ref 65–99)
GLUCOSE-CAPILLARY: 205 mg/dL — AB (ref 65–99)
Glucose-Capillary: 207 mg/dL — ABNORMAL HIGH (ref 65–99)
Glucose-Capillary: 186 mg/dL — ABNORMAL HIGH (ref 65–99)
Glucose-Capillary: 227 mg/dL — ABNORMAL HIGH (ref 65–99)
Glucose-Capillary: 229 mg/dL — ABNORMAL HIGH (ref 65–99)

## 2017-01-15 LAB — CULTURE, RESPIRATORY: SPECIAL REQUESTS: NORMAL

## 2017-01-15 LAB — CBC
HEMATOCRIT: 23.3 % — AB (ref 39.0–52.0)
Hemoglobin: 8.2 g/dL — ABNORMAL LOW (ref 13.0–17.0)
MCH: 30.9 pg (ref 26.0–34.0)
MCHC: 35.2 g/dL (ref 30.0–36.0)
MCV: 87.9 fL (ref 78.0–100.0)
Platelets: 162 10*3/uL (ref 150–400)
RBC: 2.65 MIL/uL — ABNORMAL LOW (ref 4.22–5.81)
RDW: 12.3 % (ref 11.5–15.5)
WBC: 6 10*3/uL (ref 4.0–10.5)

## 2017-01-15 LAB — CULTURE, RESPIRATORY W GRAM STAIN

## 2017-01-15 MED ORDER — INSULIN GLARGINE 100 UNIT/ML ~~LOC~~ SOLN
15.0000 [IU] | Freq: Two times a day (BID) | SUBCUTANEOUS | Status: DC
  Administered 2017-01-15 – 2017-02-01 (×35): 15 [IU] via SUBCUTANEOUS
  Filled 2017-01-15 (×37): qty 0.15

## 2017-01-15 MED ORDER — INSULIN GLARGINE 100 UNIT/ML ~~LOC~~ SOLN: 10.0000 [IU] | Freq: Two times a day (BID) | SUBCUTANEOUS | Status: DC

## 2017-01-15 MED ORDER — SODIUM CHLORIDE 0.9 % IV SOLN
750.0000 mg | Freq: Two times a day (BID) | INTRAVENOUS | Status: DC
  Administered 2017-01-16 – 2017-01-30 (×29): 750 mg via INTRAVENOUS
  Filled 2017-01-15 (×31): qty 7.5

## 2017-01-15 MED ORDER — INSULIN GLARGINE 100 UNIT/ML ~~LOC~~ SOLN
10.0000 [IU] | Freq: Two times a day (BID) | SUBCUTANEOUS | Status: DC
  Filled 2017-01-15: qty 0.1

## 2017-01-15 NOTE — Progress Notes (Signed)
Follow up - Trauma and Critical Care  Patient Details:    [name] is an 51 y.o. male.  Lines/tubes : Airway 8 mm (Active)  Secured at (cm) 22 cm 01/15/2017  8:00 AM  Measured From Lips 01/15/2017  8:00 AM  Secured Location Center 01/15/2017  8:00 AM  Secured By [employer] 01/15/2017  8:00 AM  Tube Holder Repositioned Yes 01/15/2017  8:00 AM  Cuff Pressure (cm H2O) 26 cm H2O 01/15/2017  2:59 AM  Site Condition Dry 01/15/2017  8:00 AM     PICC Double Lumen 01/12/17 PICC Right Basilic 42 cm 0 cm (Active)  Indication for Insertion or Continuance of Line Prolonged intravenous therapies;Head or chest injuries (Tracheotomy, burns, open chest wounds) 01/15/2017  8:00 AM  Exposed Catheter (cm) 0 cm 01/12/2017  3:28 PM  Site Assessment Clean;Dry;Intact 01/15/2017  8:00 AM  Lumen #1 Status Infusing 01/15/2017  8:00 AM  Lumen #2 Status Infusing 01/15/2017  8:00 AM  Dressing Type Transparent 01/15/2017  8:00 AM  Dressing Status Clean;Dry;Intact 01/15/2017  8:00 AM  Line Care Connections checked and tightened 01/15/2017  8:00 AM  Dressing Change Due 01/19/17 01/14/2017  8:00 PM     Arterial Line 01/13/17 Left Radial (Active)  Site Assessment Clean;Dry;Intact 01/14/2017  8:00 PM  Line Status Pulsatile blood flow 01/14/2017  8:00 PM  Art Line Waveform Whip 01/14/2017  8:00 PM  Art Line Interventions Zeroed and calibrated;Leveled;Flushed per protocol;Line pulled back 01/14/2017  8:00 PM  Color/Movement/Sensation Capillary refill less than 3 sec 01/14/2017  8:00 PM  Dressing Type Occlusive;Transparent 01/14/2017  8:00 PM  Dressing Status Clean;Dry;Intact 01/14/2017  8:00 PM  Dressing Change Due 01/20/17 01/14/2017  8:00 PM     NG/OG Tube Orogastric 16 Fr. Center mouth (Active)  Site Assessment Clean;Dry;Intact 01/15/2017  8:00 AM  Ongoing Placement Verification No change in cm markings or external length of tube from initial placement;No change in respiratory status 01/15/2017  8:00 AM   Status Infusing tube feed 01/15/2017  8:00 AM  Drainage Appearance Cloudy;Milky 01/14/2017  2:00 PM     Urethral Catheter Junious Dresser Dupont Latex 16 Fr. (Active)  Indication for Insertion or Continuance of Catheter Unstable critical patients (first 24-48 hours) 01/15/2017  8:00 AM  Site Assessment Clean;Intact;Dry 01/15/2017  8:00 AM  Catheter Maintenance Bag below level of bladder;Catheter secured;Drainage bag/tubing not touching floor;Insertion date on drainage bag;No dependent loops;Seal intact 01/15/2017  8:00 AM  Collection Container Standard drainage bag 01/15/2017  8:00 AM  Securement Method Securing device (Describe) 01/14/2017  8:00 PM  Urinary Catheter Interventions Unclamped 01/15/2017  8:00 AM  Output (mL) 125 mL 01/15/2017  6:00 AM    Microbiology/Sepsis markers: Results for orders placed or performed during the hospital encounter of 01/10/17  Blood Culture (routine x 2)     Status: None (Preliminary result)   Collection Time: 01/10/17 10:35 AM  Result Value Ref Range Status   Specimen Description BLOOD RIGHT ANTECUBITAL  Final   Special Requests BOTTLES DRAWN AEROBIC AND ANAEROBIC 10CC  Final   Culture NO GROWTH 4 DAYS  Final   Report Status PENDING  Incomplete  Blood Culture (routine x 2)     Status: None (Preliminary result)   Collection Time: 01/10/17 10:40 AM  Result Value Ref Range Status   Specimen Description BLOOD RIGHT HAND  Final   Special Requests BOTTLES DRAWN AEROBIC AND ANAEROBIC 5CC  Final   Culture NO GROWTH 4 DAYS  Final   Report Status PENDING  Incomplete  Urine culture     Status: None   Collection Time: 01/10/17 12:43 PM  Result Value Ref Range Status   Specimen Description URINE, CATHETERIZED  Final   Special Requests NONE  Final   Culture NO GROWTH  Final   Report Status 01/11/2017 FINAL  Final  MRSA PCR Screening     Status: None   Collection Time: 01/10/17  3:21 PM  Result Value Ref Range Status   MRSA by PCR NEGATIVE NEGATIVE Final    Comment:         The GeneXpert MRSA Assay (FDA approved for NASAL specimens only), is one component of a comprehensive MRSA colonization surveillance program. It is not intended to diagnose MRSA infection nor to guide or monitor treatment for MRSA infections.   Culture, respiratory (NON-Expectorated)     Status: None (Preliminary result)   Collection Time: 01/13/17 11:17 AM  Result Value Ref Range Status   Specimen Description TRACHEAL ASPIRATE  Final   Special Requests Normal  Final   Gram Stain   Final    MODERATE WBC PRESENT, PREDOMINANTLY PMN FEW SQUAMOUS EPITHELIAL CELLS PRESENT MODERATE GRAM POSITIVE COCCI IN PAIRS FEW GRAM NEGATIVE COCCOBACILLI FEW GRAM POSITIVE RODS    Culture   Final    MODERATE STAPHYLOCOCCUS AUREUS SUSCEPTIBILITIES TO FOLLOW    Report Status PENDING  Incomplete    Anti-infectives:  Anti-infectives    Start     Dose/Rate Route Frequency Ordered Stop   01/13/17 1830  piperacillin-tazobactam (ZOSYN) IVPB 3.375 g     3.375 g 12.5 mL/hr over 240 Minutes Intravenous Every 8 hours 01/13/17 1016     01/13/17 1030  piperacillin-tazobactam (ZOSYN) IVPB 3.375 g     3.375 g 100 mL/hr over 30 Minutes Intravenous  Once 01/13/17 1016 01/13/17 1125   01/10/17 1600  metroNIDAZOLE (FLAGYL) IVPB 500 mg     500 mg 100 mL/hr over 60 Minutes Intravenous  Once 01/10/17 1518 01/10/17 1629   01/10/17 1045  cefTRIAXone (ROCEPHIN) 1 g in dextrose 5 % 50 mL IVPB     1 g 100 mL/hr over 30 Minutes Intravenous  Once 01/10/17 1035 01/10/17 1248   01/10/17 1045  azithromycin (ZITHROMAX) 500 mg in dextrose 5 % 250 mL IVPB     500 mg 250 mL/hr over 60 Minutes Intravenous  Once 01/10/17 1035 01/10/17 1353   01/10/17 1045  metroNIDAZOLE (FLAGYL) IVPB 500 mg  Status:  Discontinued     500 mg 100 mL/hr over 60 Minutes Intravenous  Once 01/10/17 1035 01/10/17 1517      Best Practice/Protocols:  VTE Prophylaxis: Heparin (SQ) and Mechanical GI Prophylaxis: Proton Pump Inhibitor Continous  Sedation Only on fentanyl 50 per h ours  Consults: Treatment Team:  Tia Alert, MD    Events:  Subjective:    Overnight Issues: No significant events overnight.  Objective:  Vital signs for last 24 hours: Temp:  [98.7 F (37.1 C)-101.5 F (38.6 C)] 99.8 F (37.7 C) (03/16 0800) Pulse Rate:  [88-101] 92 (03/16 0800) Resp:  [13-20] 20 (03/16 0800) BP: (119-152)/(67-77) 142/72 (03/16 0800) SpO2:  [95 %-100 %] 98 % (03/16 0800) Arterial Line BP: (137-186)/(58-94) 183/69 (03/16 0800) FiO2 (%):  [30 %] 30 % (03/16 0700) Weight:  [73.3 kg (161 lb 9.6 oz)] 73.3 kg (161 lb 9.6 oz) (03/16 0445)  Hemodynamic parameters for last 24 hours:    Intake/Output from previous day: 03/15 0701 - 03/16 0700 In: 3539.9 [I.V.:2219.9; NG/GT:960; IV Piggyback:360] Out: 2625 [  Urine:2625]  Intake/Output this shift: Total I/O In: 91.3 [I.V.:11.3; NG/GT:80] Out: -   Vent settings for last 24 hours: Vent Mode: PRVC FiO2 (%):  [30 %] 30 % Set Rate:  [15 bmp-20 bmp] 20 bmp Vt Set:  [450 mL] 450 mL PEEP:  [5 cmH20] 5 cmH20 Plateau Pressure:  [9 cmH20-13 cmH20] 13 cmH20  Physical Exam:  General: no respiratory distress Neuro: RASS -1 and Moves a bit to stimulation, hard to tell if it is posturing.  Right pupil is 2mm, left is 1, hard to tell if reactive. Resp: clear to auscultation bilaterally CVS: regular rate and rhythm, S1, S2 normal, no murmur, click, rub or gallop GI: soft, nontender, BS WNL, no r/g and tolerating tube feedings well at 40 Extremities: no edema, no erythema, pulses WNL  Results for orders placed or performed during the hospital encounter of 01/10/17 (from the past 24 hour(s))  Glucose, capillary     Status: Abnormal   Collection Time: 01/14/17 11:43 AM  Result Value Ref Range   Glucose-Capillary 199 (H) 65 - 99 mg/dL   Comment 1 Notify RN    Comment 2 Document in Chart   Glucose, capillary     Status: Abnormal   Collection Time: 01/14/17  3:15 PM  Result Value  Ref Range   Glucose-Capillary 203 (H) 65 - 99 mg/dL   Comment 1 Notify RN    Comment 2 Document in Chart   Blood gas, arterial     Status: Abnormal   Collection Time: 01/14/17  3:50 PM  Result Value Ref Range   FIO2 30.00    Delivery systems VENTILATOR    Mode PRESSURE REGULATED VOLUME CONTROL    VT 450 mL   LHR 20 resp/min   Peep/cpap 5.0 cm H20   pH, Arterial 7.477 (H) 7.350 - 7.450   pCO2 arterial 43.1 32.0 - 48.0 mmHg   pO2, Arterial 82.2 (L) 83.0 - 108.0 mmHg   Bicarbonate 31.5 (H) 20.0 - 28.0 mmol/L   Acid-Base Excess 7.7 (H) 0.0 - 2.0 mmol/L   O2 Saturation 96.5 %   Patient temperature 98.6    Collection site A-LINE    Drawn by [id number]    Sample type ARTERIAL DRAW   Glucose, capillary     Status: Abnormal   Collection Time: 01/14/17  8:13 PM  Result Value Ref Range   Glucose-Capillary 184 (H) 65 - 99 mg/dL  Glucose, capillary     Status: Abnormal   Collection Time: 01/14/17 11:32 PM  Result Value Ref Range   Glucose-Capillary 294 (H) 65 - 99 mg/dL  Glucose, capillary     Status: Abnormal   Collection Time: 01/15/17  3:44 AM  Result Value Ref Range   Glucose-Capillary 207 (H) 65 - 99 mg/dL  CBC     Status: Abnormal   Collection Time: 01/15/17  5:45 AM  Result Value Ref Range   WBC 6.0 4.0 - 10.5 K/uL   RBC 2.65 (L) 4.22 - 5.81 MIL/uL   Hemoglobin 8.2 (L) 13.0 - 17.0 g/dL   HCT 47.823.3 (L) 29.539.0 - 62.152.0 %   MCV 87.9 78.0 - 100.0 fL   MCH 30.9 26.0 - 34.0 pg   MCHC 35.2 30.0 - 36.0 g/dL   RDW 30.812.3 65.711.5 - 84.615.5 %   Platelets 162 150 - 400 K/uL  Basic metabolic panel     Status: Abnormal   Collection Time: 01/15/17  5:45 AM  Result Value Ref Range   Sodium 138 135 -  145 mmol/L   Potassium 3.4 (L) 3.5 - 5.1 mmol/L   Chloride 102 101 - 111 mmol/L   CO2 31 22 - 32 mmol/L   Glucose, Bld 184 (H) 65 - 99 mg/dL   BUN 13 6 - 20 mg/dL   Creatinine, Ser 4.09 (L) 0.61 - 1.24 mg/dL   Calcium 7.5 (L) 8.9 - 10.3 mg/dL   GFR calc non Af Amer >60 >60 mL/min   GFR calc Af Amer  >60 >60 mL/min   Anion gap 5 5 - 15  Glucose, capillary     Status: Abnormal   Collection Time: 01/15/17  8:11 AM  Result Value Ref Range   Glucose-Capillary 186 (H) 65 - 99 mg/dL     Assessment/Plan:   NEURO  Altered Mental Status:  coma and sedation   Plan: Wean sedation as much as possible  PULM  Atelectasis/collapse (focal and LLL) small left effusion   Plan: Check ABG  CARDIO  No significant issues   Plan: CPM  RENAL  Urine output and renal function are good   Plan: CPM  GI  No specific issues, bone flap on the right.   Plan: CPM with tube feedings.  ID  Pneumonia (hospital acquired (not ventilator-associated) Staph aureus pneumonia, sensitivities pending.  Has not spike a fever and WBC okay.  Will just keep on Zosyn until sensitvities are back)   Plan: CPM  HEME  Anemia acute blood loss anemia)   Plan: Not anemic enough to transfuse  ENDO Diabetes Mellitus (Type II)   Plan: COntrol hyperglycemia  Global Issues  Patient is only on a small amount of sedation currently.  Not weaning yet, but will attempt.  Neurologically he has not started to progress.  Touched on the topic of tracheostomy and PEG with the sister today.  We will give the patient the weekend to declare how much he may recover neurologically.    LOS: 5 days   Additional comments:I reviewed the patient's new clinical lab test results. cbc/bmet and I reviewed the patients new imaging test results. cxr  Critical Care Total Time*: 30 Minutes  Labria Wos 01/15/2017  *Care during the described time interval was provided by me and/or other providers on the critical care team.  I have reviewed this patient's available data, including medical history, events of note, physical examination and test results as part of my evaluation.

## 2017-01-15 NOTE — Progress Notes (Signed)
Patient ID: Jamie BeckwithMiguel Garcia Berg, male   DOB: [birth year], 51 y.o.   MRN: [id number] No exam to follow, intubated and sedated, does have a cough, R eye swollen shut, L pupil small and sluggish

## 2017-01-15 NOTE — Progress Notes (Signed)
Case Management Note  Patient Details  Name: Loni BeckwithMiguel Garcia Cardenas MRN: [id number] Date of Birth: 1966/02/19  Subjective/Objective:  Pt admitted on 01/10/17 s/p presumed fall with SDH/IPH and mastoid fracture in temporal bone.  PTA, pt independent of ADLS.                    Action/Plan: Pt remains intubated currently.  Will follow for discharge planning as pt progresses.    Expected Discharge Date:                         Expected Discharge Plan:     In-House Referral:  Clinical Social Work  Discharge planning Services  CM Consult  Post Acute Care Choice:    Choice offered to:     DME Arranged:    DME Agency:     HH Arranged:    HH Agency:     Status of Service:  In process, will continue to follow  If discussed at Long Length of Stay Meetings, dates discussed:    Additional Comments:  01/15/17 J. Kailey Esquilin, RN, BSN  Pt to OR on 01/14/17 for Rt fronto temporal parietal craniectomy for hematoma evacuation.  Pt remains intubated and sedated currently.    Glennon MacAmerson, Aniyia Rane M, RN 01/12/2017, 4:38 PM

## 2017-01-16 ENCOUNTER — Inpatient Hospital Stay (HOSPITAL_COMMUNITY): Payer: Medicaid Other

## 2017-01-16 LAB — BASIC METABOLIC PANEL
ANION GAP: 11 (ref 5–15)
BUN: 13 mg/dL (ref 6–20)
CALCIUM: 7.5 mg/dL — AB (ref 8.9–10.3)
CO2: 26 mmol/L (ref 22–32)
Chloride: 99 mmol/L — ABNORMAL LOW (ref 101–111)
Creatinine, Ser: 0.55 mg/dL — ABNORMAL LOW (ref 0.61–1.24)
GFR calc Af Amer: >60 mL/min (ref >60)
GFR calc non Af Amer: >60 mL/min (ref >60)
GLUCOSE: 217 mg/dL — AB (ref 65–99)
POTASSIUM: 3.3 mmol/L — AB (ref 3.5–5.1)
SODIUM: 136 mmol/L (ref 135–145)

## 2017-01-16 LAB — CBC WITH DIFFERENTIAL/PLATELET
Basophils Absolute: 0 10*3/uL (ref 0.0–0.1)
Basophils Relative: 0 %
EOS ABS: 0 10*3/uL (ref 0.0–0.7)
EOS PCT: 0 %
HCT: 23.2 % — ABNORMAL LOW (ref 39.0–52.0)
Hemoglobin: 8.2 g/dL — ABNORMAL LOW (ref 13.0–17.0)
LYMPHS ABS: 0.7 10*3/uL (ref 0.7–4.0)
Lymphocytes Relative: 10 %
MCH: 30.7 pg (ref 26.0–34.0)
MCHC: 35.3 g/dL (ref 30.0–36.0)
MCV: 86.9 fL (ref 78.0–100.0)
Monocytes Absolute: 0.6 10*3/uL (ref 0.1–1.0)
Monocytes Relative: 9 %
Neutro Abs: 5.7 10*3/uL (ref 1.7–7.7)
Neutrophils Relative %: 81 %
PLATELETS: 198 10*3/uL (ref 150–400)
RBC: 2.67 MIL/uL — AB (ref 4.22–5.81)
RDW: 12.3 % (ref 11.5–15.5)
WBC: 7 10*3/uL (ref 4.0–10.5)

## 2017-01-16 LAB — BLOOD GAS, ARTERIAL
ACID-BASE EXCESS: 4.9 mmol/L — AB (ref 0.0–2.0)
BICARBONATE: 28.1 mmol/L — AB (ref 20.0–28.0)
DRAWN BY: 23604
FIO2: 30
O2 SAT: 97.3 %
PEEP/CPAP: 5 cmH2O
Patient temperature: 98.6
RATE: 20 resp/min
VT: 450 mL
pCO2 arterial: 36.1 mmHg (ref 32.0–48.0)
pH, Arterial: 7.503 — ABNORMAL HIGH (ref 7.350–7.450)
pO2, Arterial: 92.4 mmHg (ref 83.0–108.0)

## 2017-01-16 LAB — TRIGLYCERIDES: TRIGLYCERIDES: 88 mg/dL (ref <150)

## 2017-01-16 LAB — GLUCOSE, CAPILLARY
GLUCOSE-CAPILLARY: 222 mg/dL — AB (ref 65–99)
GLUCOSE-CAPILLARY: 188 mg/dL — AB (ref 65–99)
Glucose-Capillary: 170 mg/dL — ABNORMAL HIGH (ref 65–99)
Glucose-Capillary: 205 mg/dL — ABNORMAL HIGH (ref 65–99)
Glucose-Capillary: 206 mg/dL — ABNORMAL HIGH (ref 65–99)
Glucose-Capillary: 236 mg/dL — ABNORMAL HIGH (ref 65–99)

## 2017-01-16 NOTE — Progress Notes (Signed)
Patient ID: [name], male   DOB: [birth year], 51 y.o.   MRN: [id number] Patient remains comatose and intubated  Extensor posturing in the right left hemiparesis incision clean dry and intact  Continue observation

## 2017-01-16 NOTE — Progress Notes (Signed)
Follow up - Trauma and Critical Care  Patient Details:    [name] is an 51 y.o. male.  Lines/tubes : Airway 8 mm (Active)  Secured at (cm) 22 cm 01/16/2017 12:06 PM  Measured From Lips 01/16/2017 12:06 PM  Secured Location Right 01/16/2017 12:06 PM  Secured By [employer] 01/16/2017 12:06 PM  Tube Holder Repositioned Yes 01/16/2017 12:06 PM  Cuff Pressure (cm H2O) 22 cm H2O 01/16/2017 12:06 PM  Site Condition Dry 01/16/2017 12:06 PM     PICC Double Lumen 01/12/17 PICC Right Basilic 42 cm 0 cm (Active)  Indication for Insertion or Continuance of Line Prolonged intravenous therapies;Head or chest injuries (Tracheotomy, burns, open chest wounds) 01/16/2017  9:00 AM  Exposed Catheter (cm) 0 cm 01/12/2017  3:28 PM  Site Assessment Clean;Dry;Intact 01/16/2017  9:00 AM  Lumen #1 Status Infusing;Flushed;Blood return noted 01/16/2017  9:00 AM  Lumen #2 Status Infusing;Flushed;Blood return noted 01/16/2017  9:00 AM  Dressing Type Transparent 01/16/2017  9:00 AM  Dressing Status Clean;Dry;Intact 01/16/2017  9:00 AM  Line Care Connections checked and tightened 01/16/2017  9:00 AM  Dressing Change Due 01/19/17 01/16/2017  9:00 AM     NG/OG Tube Orogastric 16 Fr. Center mouth (Active)  Site Assessment Clean;Dry;Intact 01/16/2017  8:00 AM  Ongoing Placement Verification No change in cm markings or external length of tube from initial placement;No change in respiratory status 01/16/2017  8:00 AM  Status Infusing tube feed 01/16/2017  8:00 AM  Drainage Appearance Cloudy;Milky 01/14/2017  2:00 PM  Intake (mL) 100 mL 01/15/2017 10:00 PM     Urethral Catheter Connie Dupont Latex 16 Fr. (Active)  Indication for Insertion or Continuance of Catheter Acute urinary retention 01/16/2017  8:00 AM  Site Assessment Clean;Intact;Dry 01/16/2017  8:00 AM  Catheter Maintenance Bag below level of bladder;Catheter secured;Drainage bag/tubing not touching floor;Insertion date on drainage bag;No dependent  loops;Seal intact 01/16/2017  8:00 AM  Collection Container Standard drainage bag 01/16/2017  8:00 AM  Securement Method Securing device (Describe) 01/16/2017  8:00 AM  Urinary Catheter Interventions Unclamped 01/16/2017  8:00 AM  Output (mL) 1850 mL 01/16/2017  1:39 PM    Microbiology/Sepsis markers: Results for orders placed or performed during the hospital encounter of 01/10/17  Blood Culture (routine x 2)     Status: None   Collection Time: 01/10/17 10:35 AM  Result Value Ref Range Status   Specimen Description BLOOD RIGHT ANTECUBITAL  Final   Special Requests BOTTLES DRAWN AEROBIC AND ANAEROBIC 10CC  Final   Culture NO GROWTH 5 DAYS  Final   Report Status 01/15/2017 FINAL  Final  Blood Culture (routine x 2)     Status: None   Collection Time: 01/10/17 10:40 AM  Result Value Ref Range Status   Specimen Description BLOOD RIGHT HAND  Final   Special Requests BOTTLES DRAWN AEROBIC AND ANAEROBIC 5CC  Final   Culture NO GROWTH 5 DAYS  Final   Report Status 01/15/2017 FINAL  Final  Urine culture     Status: None   Collection Time: 01/10/17 12:43 PM  Result Value Ref Range Status   Specimen Description URINE, CATHETERIZED  Final   Special Requests NONE  Final   Culture NO GROWTH  Final   Report Status 01/11/2017 FINAL  Final  MRSA PCR Screening     Status: None   Collection Time: 01/10/17  3:21 PM  Result Value Ref Range Status   MRSA by PCR NEGATIVE NEGATIVE Final    Comment:  The GeneXpert MRSA Assay (FDA approved for NASAL specimens only), is one component of a comprehensive MRSA colonization surveillance program. It is not intended to diagnose MRSA infection nor to guide or monitor treatment for MRSA infections.   Culture, respiratory (NON-Expectorated)     Status: None   Collection Time: 01/13/17 11:17 AM  Result Value Ref Range Status   Specimen Description TRACHEAL ASPIRATE  Final   Special Requests Normal  Final   Gram Stain   Final    MODERATE WBC PRESENT,  PREDOMINANTLY PMN FEW SQUAMOUS EPITHELIAL CELLS PRESENT MODERATE GRAM POSITIVE COCCI IN PAIRS FEW GRAM NEGATIVE COCCOBACILLI FEW GRAM POSITIVE RODS    Culture MODERATE STAPHYLOCOCCUS AUREUS  Final   Report Status 01/15/2017 FINAL  Final   Organism ID, Bacteria STAPHYLOCOCCUS AUREUS  Final      Susceptibility   Staphylococcus aureus - MIC*    CIPROFLOXACIN <=0.5 SENSITIVE Sensitive     ERYTHROMYCIN >=8 RESISTANT Resistant     GENTAMICIN <=0.5 SENSITIVE Sensitive     OXACILLIN 0.5 SENSITIVE Sensitive     TETRACYCLINE >=16 RESISTANT Resistant     VANCOMYCIN 1 SENSITIVE Sensitive     TRIMETH/SULFA <=10 SENSITIVE Sensitive     CLINDAMYCIN RESISTANT Resistant     RIFAMPIN <=0.5 SENSITIVE Sensitive     Inducible Clindamycin POSITIVE Resistant     * MODERATE STAPHYLOCOCCUS AUREUS    Anti-infectives:  Anti-infectives    Start     Dose/Rate Route Frequency Ordered Stop   01/13/17 1830  piperacillin-tazobactam (ZOSYN) IVPB 3.375 g     3.375 g 12.5 mL/hr over 240 Minutes Intravenous Every 8 hours 01/13/17 1016     01/13/17 1030  piperacillin-tazobactam (ZOSYN) IVPB 3.375 g     3.375 g 100 mL/hr over 30 Minutes Intravenous  Once 01/13/17 1016 01/13/17 1125   01/10/17 1600  metroNIDAZOLE (FLAGYL) IVPB 500 mg     500 mg 100 mL/hr over 60 Minutes Intravenous  Once 01/10/17 1518 01/10/17 1629   01/10/17 1045  cefTRIAXone (ROCEPHIN) 1 g in dextrose 5 % 50 mL IVPB     1 g 100 mL/hr over 30 Minutes Intravenous  Once 01/10/17 1035 01/10/17 1248   01/10/17 1045  azithromycin (ZITHROMAX) 500 mg in dextrose 5 % 250 mL IVPB     500 mg 250 mL/hr over 60 Minutes Intravenous  Once 01/10/17 1035 01/10/17 1353   01/10/17 1045  metroNIDAZOLE (FLAGYL) IVPB 500 mg  Status:  Discontinued     500 mg 100 mL/hr over 60 Minutes Intravenous  Once 01/10/17 1035 01/10/17 1517      Best Practice/Protocols:  VTE Prophylaxis: Lovenox (prophylaxtic dose) and Mechanical GI Prophylaxis: Proton Pump  Inhibitor Sedation is currently off  Consults: Treatment Team:  Tia Alert, MD    Events:  Subjective:    Overnight Issues: No issues overnight.  Fever curve is better.  Objective:  Vital signs for last 24 hours: Temp:  [98.5 F (36.9 C)-100 F (37.8 C)] 99.2 F (37.3 C) (03/17 1200) Pulse Rate:  [81-103] 81 (03/17 1400) Resp:  [14-23] 21 (03/17 1400) BP: (99-152)/(52-79) 107/56 (03/17 1400) SpO2:  [97 %-100 %] 100 % (03/17 1400) FiO2 (%):  [30 %] 30 % (03/17 1400) Weight:  [71.6 kg (157 lb 13.6 oz)] 71.6 kg (157 lb 13.6 oz) (03/17 0500)  Hemodynamic parameters for last 24 hours:    Intake/Output from previous day: 03/16 0701 - 03/17 0700 In: 3407.1 [I.V.:2034.6; NG/GT:1060; IV Piggyback:312.5] Out: 3295 [Urine:3295]  Intake/Output this shift:  Total I/O In: 857.5 [I.V.:527.5; NG/GT:280; IV Piggyback:50] Out: 1850 [Urine:1850]  Vent settings for last 24 hours: Vent Mode: PSV;CPAP FiO2 (%):  [30 %] 30 % Set Rate:  [20 bmp] 20 bmp Vt Set:  [450 mL] 450 mL PEEP:  [5 cmH20] 5 cmH20 Pressure Support:  [5 cmH20-8 cmH20] 5 cmH20 Plateau Pressure:  [14 cmH20] 14 cmH20  Physical Exam:  General: no respiratory distress Neuro: weakness left upper extremity, weakness left lower extremity and postures on the right upper extremity Resp: clear to auscultation bilaterally CVS: regular rate and rhythm, S1, S2 normal, no murmur, click, rub or gallop GI: soft, nontender, BS WNL, no r/g Extremities: no edema, no erythema, pulses WNL  Results for orders placed or performed during the hospital encounter of 01/10/17 (from the past 24 hour(s))  Glucose, capillary     Status: Abnormal   Collection Time: 01/15/17  3:57 PM  Result Value Ref Range   Glucose-Capillary 192 (H) 65 - 99 mg/dL  Glucose, capillary     Status: Abnormal   Collection Time: 01/15/17  8:27 PM  Result Value Ref Range   Glucose-Capillary 205 (H) 65 - 99 mg/dL  Glucose, capillary     Status: Abnormal    Collection Time: 01/15/17 11:15 PM  Result Value Ref Range   Glucose-Capillary 229 (H) 65 - 99 mg/dL  Glucose, capillary     Status: Abnormal   Collection Time: 01/16/17  3:12 AM  Result Value Ref Range   Glucose-Capillary 206 (H) 65 - 99 mg/dL  Blood gas, arterial     Status: Abnormal   Collection Time: 01/16/17  4:09 AM  Result Value Ref Range   FIO2 30.00    Delivery systems VENTILATOR    Mode PRESSURE REGULATED VOLUME CONTROL    VT 450 mL   LHR 20 resp/min   Peep/cpap 5.0 cm H20   pH, Arterial 7.503 (H) 7.350 - 7.450   pCO2 arterial 36.1 32.0 - 48.0 mmHg   pO2, Arterial 92.4 83.0 - 108.0 mmHg   Bicarbonate 28.1 (H) 20.0 - 28.0 mmol/L   Acid-Base Excess 4.9 (H) 0.0 - 2.0 mmol/L   O2 Saturation 97.3 %   Patient temperature 98.6    Collection site LEFT RADIAL    Drawn by [id number]    Sample type ARTERIAL DRAW    Allens test (pass/fail) PASS PASS  Triglycerides     Status: None   Collection Time: 01/16/17  5:48 AM  Result Value Ref Range   Triglycerides 88 <150 mg/dL  CBC with Differential/Platelet     Status: Abnormal   Collection Time: 01/16/17  5:48 AM  Result Value Ref Range   WBC 7.0 4.0 - 10.5 K/uL   RBC 2.67 (L) 4.22 - 5.81 MIL/uL   Hemoglobin 8.2 (L) 13.0 - 17.0 g/dL   HCT 60.423.2 (L) 54.039.0 - 98.152.0 %   MCV 86.9 78.0 - 100.0 fL   MCH 30.7 26.0 - 34.0 pg   MCHC 35.3 30.0 - 36.0 g/dL   RDW 19.112.3 47.811.5 - 29.515.5 %   Platelets 198 150 - 400 K/uL   Neutrophils Relative % 81 %   Neutro Abs 5.7 1.7 - 7.7 K/uL   Lymphocytes Relative 10 %   Lymphs Abs 0.7 0.7 - 4.0 K/uL   Monocytes Relative 9 %   Monocytes Absolute 0.6 0.1 - 1.0 K/uL   Eosinophils Relative 0 %   Eosinophils Absolute 0.0 0.0 - 0.7 K/uL   Basophils Relative 0 %   Basophils  Absolute 0.0 0.0 - 0.1 K/uL  Basic metabolic panel     Status: Abnormal   Collection Time: 01/16/17  5:48 AM  Result Value Ref Range   Sodium 136 135 - 145 mmol/L   Potassium 3.3 (L) 3.5 - 5.1 mmol/L   Chloride 99 (L) 101 - 111 mmol/L   CO2  26 22 - 32 mmol/L   Glucose, Bld 217 (H) 65 - 99 mg/dL   BUN 13 6 - 20 mg/dL   Creatinine, Ser 6.04 (L) 0.61 - 1.24 mg/dL   Calcium 7.5 (L) 8.9 - 10.3 mg/dL   GFR calc non Af Amer >60 >60 mL/min   GFR calc Af Amer >60 >60 mL/min   Anion gap 11 5 - 15  Glucose, capillary     Status: Abnormal   Collection Time: 01/16/17  7:52 AM  Result Value Ref Range   Glucose-Capillary 222 (H) 65 - 99 mg/dL  Glucose, capillary     Status: Abnormal   Collection Time: 01/16/17 12:03 PM  Result Value Ref Range   Glucose-Capillary 236 (H) 65 - 99 mg/dL     Assessment/Plan:   NEURO  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   Plan: Tolerating tube feedings.  ID  Pneumonia (hospital acquired (not ventilator-associated) Staph sensitive to meds that he is on.)   Plan: CPM  HEME  Anemia acute blood loss anemia)   Plan: No blood for now  ENDO No specific issues   Plan: CPM  Global Issues  Patient is weaning.  Not responding neurologically.Pneumonia.  Probably will need tracheostomy and PEG next week.    LOS: 6 days   Additional comments:I reviewed the patient's new clinical lab test results. cbc/bmet  Critical Care Total Time*: 30 Minutes  Yorley Buch 01/16/2017  *Care during the described time interval was provided by me and/or other providers on the critical care team.  I have reviewed this patient's available data, including medical history, events of note, physical examination and test results as part of my evaluation.

## 2017-01-17 ENCOUNTER — Inpatient Hospital Stay (HOSPITAL_COMMUNITY): Payer: Medicaid Other

## 2017-01-17 LAB — BASIC METABOLIC PANEL
Anion gap: 9 (ref 5–15)
BUN: 15 mg/dL (ref 6–20)
CALCIUM: 7.5 mg/dL — AB (ref 8.9–10.3)
CO2: 27 mmol/L (ref 22–32)
CREATININE: 0.57 mg/dL — AB (ref 0.61–1.24)
Chloride: 101 mmol/L (ref 101–111)
Glucose, Bld: 186 mg/dL — ABNORMAL HIGH (ref 65–99)
Potassium: 3.2 mmol/L — ABNORMAL LOW (ref 3.5–5.1)
SODIUM: 137 mmol/L (ref 135–145)

## 2017-01-17 LAB — GLUCOSE, CAPILLARY
GLUCOSE-CAPILLARY: 190 mg/dL — AB (ref 65–99)
GLUCOSE-CAPILLARY: 161 mg/dL — AB (ref 65–99)
GLUCOSE-CAPILLARY: 179 mg/dL — AB (ref 65–99)
Glucose-Capillary: 198 mg/dL — ABNORMAL HIGH (ref 65–99)
Glucose-Capillary: 196 mg/dL — ABNORMAL HIGH (ref 65–99)

## 2017-01-17 LAB — CBC WITH DIFFERENTIAL/PLATELET
BASOS PCT: 0 %
Basophils Absolute: 0 10*3/uL (ref 0.0–0.1)
EOS ABS: 0.1 10*3/uL (ref 0.0–0.7)
Eosinophils Relative: 1 %
HCT: 23.7 % — ABNORMAL LOW (ref 39.0–52.0)
HEMOGLOBIN: 8.2 g/dL — AB (ref 13.0–17.0)
LYMPHS ABS: 0.7 10*3/uL (ref 0.7–4.0)
Lymphocytes Relative: 8 %
MCH: 30.5 pg (ref 26.0–34.0)
MCHC: 34.6 g/dL (ref 30.0–36.0)
MCV: 88.1 fL (ref 78.0–100.0)
MONOS PCT: 7 %
Monocytes Absolute: 0.6 10*3/uL (ref 0.1–1.0)
NEUTROS PCT: 84 %
Neutro Abs: 7.7 10*3/uL (ref 1.7–7.7)
PLATELETS: 231 10*3/uL (ref 150–400)
RBC: 2.69 MIL/uL — ABNORMAL LOW (ref 4.22–5.81)
RDW: 12.5 % (ref 11.5–15.5)
WBC: 9.1 10*3/uL (ref 4.0–10.5)

## 2017-01-17 MED ORDER — SENNA 8.6 MG PO TABS
1.0000 | ORAL_TABLET | Freq: Every day | ORAL | Status: DC
  Administered 2017-01-17 – 2017-01-31 (×13): 8.6 mg
  Filled 2017-01-17 (×14): qty 1

## 2017-01-17 MED ORDER — DOCUSATE SODIUM 50 MG/5ML PO LIQD
100.0000 mg | Freq: Every day | ORAL | Status: DC
  Administered 2017-01-17 – 2017-01-31 (×12): 100 mg
  Filled 2017-01-17 (×14): qty 10

## 2017-01-17 MED ORDER — MAGNESIUM HYDROXIDE 400 MG/5ML PO SUSP
30.0000 mL | Freq: Every day | ORAL | Status: DC
  Administered 2017-01-18 – 2017-01-26 (×7): 30 mL via ORAL
  Filled 2017-01-17 (×9): qty 30

## 2017-01-17 NOTE — Progress Notes (Signed)
Patient ID: [name], male   DOB: 07-07-1966, 51 y.o.   MRN: [id number] No significant change neurologically minimal nonpurposeful movement right upper extremity occasionally extensor posturing incision clean dry and intact flap tense

## 2017-01-17 NOTE — Progress Notes (Signed)
4 Days Post-Op  Subjective: Pt wih no acute events overnight.  Objective: Vital signs in last 24 hours: Temp:  [98 F (36.7 C)-100 F (37.8 C)] 98.1 F (36.7 C) (03/18 0831) Pulse Rate:  [79-97] 90 (03/18 0835) Resp:  [16-23] 19 (03/18 0835) BP: (107-146)/(52-78) 125/64 (03/18 0835) SpO2:  [99 %-100 %] 100 % (03/18 0835) FiO2 (%):  [30 %-60 %] 30 % (03/18 0835) Weight:  [73.2 kg (161 lb 6 oz)] 73.2 kg (161 lb 6 oz) (03/18 0339) Last BM Date: 01/14/17  Intake/Output from previous day: 03/17 0701 - 03/18 0700 In: 3071.8 [I.V.:1854.3; NG/GT:960; IV Piggyback:257.5] Out: 3775 [Urine:3775] Intake/Output this shift: Total I/O In: 120 [I.V.:80; NG/GT:40] Out: -   General appearance: sedated Cardio: RRR, not tachy GI: soft, non-tender; bowel sounds normal; no masses,  no organomegaly Neuro: not following  Lab Results:   Recent Labs  01/16/17 0548 01/17/17 0500  WBC 7.0 9.1  HGB 8.2* 8.2*  HCT 23.2* 23.7*  PLT 198 231   BMET  Recent Labs  01/16/17 0548 01/17/17 0500  NA 136 137  K 3.3* 3.2*  CL 99* 101  CO2 26 27  GLUCOSE 217* 186*  BUN 13 15  CREATININE 0.55* 0.57*  CALCIUM 7.5* 7.5*   PT/INR No results for input(s): LABPROT, INR in the last 72 hours. ABG  Recent Labs  01/15/17 0933 01/16/17 0409  PHART 7.469* 7.503*  HCO3 30.6* 28.1*    Studies/Results: Ct Head Wo Contrast  Result Date: 01/17/2017 CLINICAL DATA:  Intracranial hemorrhage EXAM: CT HEAD WITHOUT CONTRAST TECHNIQUE: Contiguous axial images were obtained from the base of the skull through the vertex without intravenous contrast. COMPARISON:  Head CT 01/13/2017 FINDINGS: Brain: The patient has undergone right pterional decompressive craniectomy. Midline shift has improved, now measuring 7 mm at the level of the foramina of [place]. The hemorrhagic contusion in the right frontal lobe is decreased relative to the prior study. Small focus of hemorrhage in the left temporal lobe and left parietal  subarachnoid blood is unchanged. A small amount of blood remains in the occipital horn of the left lateral ventricle. The size of the left temporal horn has decreased. Vascular: No hyperdense vessel or unexpected calcification. Skull: Status post right frontotemporoparietal craniectomy. There is an overlying scalp hematoma and multiple foci of soft tissue air. Sinuses/Orbits: Partial opacification of the left mastoid air cells. Mild bilateral maxillary mucosal thickening. Other: None. IMPRESSION: 1. Status post right pterional decompressive craniectomy and hematoma evacuation with decreased midline shift and decreased amount of right frontoparietal blood products. 2. Decreased size of the temporal horn of the left lateral ventricle with no current evidence of hydrocephalus or ventricular trapping. 3. Unchanged small amount of blood in the left lateral ventricle occipital horn. 4. Unchanged small focus hemorrhage in the left temporal lobe and left parietal subarachnoid blood. Electronically Signed   By: Deatra Robinson M.D.   On: 01/17/2017 05:44   Dg Chest Port 1 View  Result Date: 01/17/2017 CLINICAL DATA:  Chest trauma. EXAM: PORTABLE CHEST 1 VIEW COMPARISON:  01/15/2017 FINDINGS: Endotracheal tube with tip 3.9 cm above the carina. Nasogastric tube unchanged. Right-sided PICC line has tip over the cavoatrial junction. Lungs are hypoinflated with mild worsening opacification in the right perihilar and infrahilar region and persistent mild opacification of the left hilar region. No effusion. Cardiomediastinal silhouette and remainder the exam is unchanged. IMPRESSION: Hypoinflated lungs with slight worsening opacification in the right perihilar region and stable left perihilar opacification which may be  due to infection versus edema and atelectasis. Tubes and lines as described. Electronically Signed   By: Elberta Fortisaniel  Boyle M.D.   On: 01/17/2017 07:47    Anti-infectives: Anti-infectives    Start     Dose/Rate  Route Frequency Ordered Stop   01/13/17 1830  piperacillin-tazobactam (ZOSYN) IVPB 3.375 g     3.375 g 12.5 mL/hr over 240 Minutes Intravenous Every 8 hours 01/13/17 1016     01/13/17 1030  piperacillin-tazobactam (ZOSYN) IVPB 3.375 g     3.375 g 100 mL/hr over 30 Minutes Intravenous  Once 01/13/17 1016 01/13/17 1125   01/10/17 1600  metroNIDAZOLE (FLAGYL) IVPB 500 mg     500 mg 100 mL/hr over 60 Minutes Intravenous  Once 01/10/17 1518 01/10/17 1629   01/10/17 1045  cefTRIAXone (ROCEPHIN) 1 g in dextrose 5 % 50 mL IVPB     1 g 100 mL/hr over 30 Minutes Intravenous  Once 01/10/17 1035 01/10/17 1248   01/10/17 1045  azithromycin (ZITHROMAX) 500 mg in dextrose 5 % 250 mL IVPB     500 mg 250 mL/hr over 60 Minutes Intravenous  Once 01/10/17 1035 01/10/17 1353   01/10/17 1045  metroNIDAZOLE (FLAGYL) IVPB 500 mg  Status:  Discontinued     500 mg 100 mL/hr over 60 Minutes Intravenous  Once 01/10/17 1035 01/10/17 1517      Assessment/Plan: s/p Procedure(s): 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, Placement of bone flap in abdomen (Right) Con't supportive care con't abx Start bowel regimen May need trach-PEG later this week. [facility]   LOS: 7 days    Marigene EhlersRamirez Jr., [facility] 01/17/2017

## 2017-01-18 LAB — GLUCOSE, CAPILLARY
GLUCOSE-CAPILLARY: 148 mg/dL — AB (ref 65–99)
GLUCOSE-CAPILLARY: 168 mg/dL — AB (ref 65–99)
Glucose-Capillary: 181 mg/dL — ABNORMAL HIGH (ref 65–99)
Glucose-Capillary: 183 mg/dL — ABNORMAL HIGH (ref 65–99)
Glucose-Capillary: 192 mg/dL — ABNORMAL HIGH (ref 65–99)
Glucose-Capillary: 215 mg/dL — ABNORMAL HIGH (ref 65–99)

## 2017-01-18 MED ORDER — GLUCERNA 1.2 CAL PO LIQD
1000.0000 mL | ORAL | Status: DC
  Filled 2017-01-18 (×4): qty 1000

## 2017-01-18 MED ORDER — FREE WATER
100.0000 mL | Freq: Three times a day (TID) | Status: DC
  Administered 2017-01-18 (×3): 100 mL

## 2017-01-18 MED ORDER — CEFAZOLIN IN D5W 1 GM/50ML IV SOLN
1.0000 g | Freq: Three times a day (TID) | INTRAVENOUS | Status: AC
  Administered 2017-01-18 – 2017-01-22 (×15): 1 g via INTRAVENOUS
  Filled 2017-01-18 (×18): qty 50

## 2017-01-18 NOTE — Progress Notes (Signed)
This note also relates to the following rows which could not be included: SpO2 - Cannot attach notes to unvalidated device data  Pt placed back on full vent support due to decreased pt effort. 

## 2017-01-18 NOTE — Progress Notes (Signed)
Nutrition Follow-up  DOCUMENTATION CODES:   Not applicable  INTERVENTION:   Increase Glucerna 1.2 CAL to goal rate of 5355ml/hr (134820ml/day) Continue Prostat 30ml BID MVI daily  Regimen Provides 1784kcal, 109g protein, and 1063ml free water  NUTRITION DIAGNOSIS:   Increased nutrient needs related to  (TBI) as evidenced by increased estimated needs from protein.  GOAL:   Patient will meet greater than or equal to 90% of their needs  MONITOR:   TF tolerance, Vent status, I & O's, Labs  ASSESSMENT:   Pt admitted after a fall while intoxicated with TBI, L SDH, B ICC, IVH. Pt with hyperglycemia on admission with hx of DM.    Pt remains on ventilator. Pt had PICC placed 3/13. Pt underwent craniectomy for evacuation of hematoma on 3/15 followed by a possible seizure. Pt tolerating tube feeds well; blood glucoses improved. RD will adjust tube feed rate as pt is no longer on propofol. Plan is for pt to possibly get trach/PEG later this week. Pt with weight gain since admit. Pt with hypokalemia; continue to monitor and supplement as needed per MD discretion.     Medications reviewed and include: cefazolin, colace, fentanyl, heparin, insulin, Mg hydroxide, MVI, protonix, senokot  Labs reviewed: K 3.2(L), creat 0.57(L), Ca 7.5(L) Hgb 8.2(L), Hct 23.7(L) cbgs- 184, 217, 186 x 48 hrs AIC 10.4(H) 3/11  Patient is currently intubated on ventilator support MV: 9.2 L/min Temp (24hrs), Avg:99.7 F (37.6 C), Min:98.6 F (37 C), Max:101.3 F (38.5 C)  Propofol: none   Diet Order:  Diet NPO time specified  Skin:  Wound (see comment) (incision head and abdomen )  Last BM:  3/15  Height:   Ht Readings from Last 1 Encounters:  01/10/17 5\' 1"  (1.549 m)    Weight:   Wt Readings from Last 1 Encounters:  01/18/17 160 lb 0.9 oz (72.6 kg)    Ideal Body Weight:  59 kg  BMI:  Body mass index is 30.24 kg/m.  Estimated Nutritional Needs:   Kcal:  1792  Protein:  100-120  grams  Fluid:  > 1.8 L/day  EDUCATION NEEDS:   No education needs identified at this time  Betsey Holidayasey Zakariyya Helfman, RD, LDN Pager #- [phone]

## 2017-01-18 NOTE — Progress Notes (Signed)
Follow up - Trauma Critical Care  Patient Details:    [name] is an 51 y.o. male.  Lines/tubes : Airway 8 mm (Active)  Secured at (cm) 22 cm 01/18/2017  3:15 AM  Measured From Lips 01/18/2017  3:15 AM  Secured Location Left 01/18/2017  3:15 AM  Secured By [employer] Tube Holder 01/18/2017  3:15 AM  Tube Holder Repositioned Yes 01/18/2017  3:15 AM  Cuff Pressure (cm H2O) 28 cm H2O 01/17/2017  7:50 PM  Site Condition Dry 01/18/2017  3:15 AM     PICC Double Lumen 01/12/17 PICC Right Basilic 42 cm 0 cm (Active)  Indication for Insertion or Continuance of Line Head or chest injuries (Tracheotomy, burns, open chest wounds);Prolonged intravenous therapies 01/18/2017  8:00 AM  Exposed Catheter (cm) 0 cm 01/12/2017  3:28 PM  Site Assessment Clean;Dry;Intact 01/18/2017  8:00 AM  Lumen #1 Status Infusing;Flushed;Blood return noted 01/18/2017  8:00 AM  Lumen #2 Status Infusing;Flushed;Blood return noted 01/18/2017  8:00 AM  Dressing Type Transparent 01/18/2017  8:00 AM  Dressing Status Clean;Dry;Intact 01/18/2017  8:00 AM  Line Care Connections checked and tightened 01/18/2017  8:00 AM  Dressing Change Due 01/19/17 01/17/2017  8:00 PM     NG/OG Tube Orogastric 16 Fr. Center mouth (Active)  Site Assessment Clean;Dry;Intact 01/17/2017  8:00 PM  Ongoing Placement Verification No change in cm markings or external length of tube from initial placement;No change in respiratory status 01/17/2017  8:00 PM  Status Infusing tube feed 01/17/2017  8:00 PM  Drainage Appearance Cloudy;Milky 01/16/2017  8:00 PM  Intake (mL) 100 mL 01/15/2017 10:00 PM     Urethral Catheter Connie Dupont Latex 16 Fr. (Active)  Indication for Insertion or Continuance of Catheter Acute urinary retention 01/17/2017  8:00 PM  Site Assessment Clean;Intact;Dry 01/17/2017  8:00 PM  Catheter Maintenance Bag below level of bladder;Catheter secured;Drainage bag/tubing not touching floor;Insertion date on drainage bag;No dependent loops;Seal  intact 01/17/2017  8:00 PM  Collection Container Standard drainage bag 01/17/2017  8:00 PM  Securement Method Securing device (Describe) 01/17/2017  8:00 PM  Urinary Catheter Interventions Unclamped 01/17/2017  8:00 PM  Output (mL) 150 mL 01/18/2017  6:00 AM    Microbiology/Sepsis markers: Results for orders placed or performed during the hospital encounter of 01/10/17  Blood Culture (routine x 2)     Status: None   Collection Time: 01/10/17 10:35 AM  Result Value Ref Range Status   Specimen Description BLOOD RIGHT ANTECUBITAL  Final   Special Requests BOTTLES DRAWN AEROBIC AND ANAEROBIC 10CC  Final   Culture NO GROWTH 5 DAYS  Final   Report Status 01/15/2017 FINAL  Final  Blood Culture (routine x 2)     Status: None   Collection Time: 01/10/17 10:40 AM  Result Value Ref Range Status   Specimen Description BLOOD RIGHT HAND  Final   Special Requests BOTTLES DRAWN AEROBIC AND ANAEROBIC 5CC  Final   Culture NO GROWTH 5 DAYS  Final   Report Status 01/15/2017 FINAL  Final  Urine culture     Status: None   Collection Time: 01/10/17 12:43 PM  Result Value Ref Range Status   Specimen Description URINE, CATHETERIZED  Final   Special Requests NONE  Final   Culture NO GROWTH  Final   Report Status 01/11/2017 FINAL  Final  MRSA PCR Screening     Status: None   Collection Time: 01/10/17  3:21 PM  Result Value Ref Range Status   MRSA by PCR NEGATIVE  NEGATIVE Final    Comment:        The GeneXpert MRSA Assay (FDA approved for NASAL specimens only), is one component of a comprehensive MRSA colonization surveillance program. It is not intended to diagnose MRSA infection nor to guide or monitor treatment for MRSA infections.   Culture, respiratory (NON-Expectorated)     Status: None   Collection Time: 01/13/17 11:17 AM  Result Value Ref Range Status   Specimen Description TRACHEAL ASPIRATE  Final   Special Requests Normal  Final   Gram Stain   Final    MODERATE WBC PRESENT, PREDOMINANTLY  PMN FEW SQUAMOUS EPITHELIAL CELLS PRESENT MODERATE GRAM POSITIVE COCCI IN PAIRS FEW GRAM NEGATIVE COCCOBACILLI FEW GRAM POSITIVE RODS    Culture MODERATE STAPHYLOCOCCUS AUREUS  Final   Report Status 01/15/2017 FINAL  Final   Organism ID, Bacteria STAPHYLOCOCCUS AUREUS  Final      Susceptibility   Staphylococcus aureus - MIC*    CIPROFLOXACIN <=0.5 SENSITIVE Sensitive     ERYTHROMYCIN >=8 RESISTANT Resistant     GENTAMICIN <=0.5 SENSITIVE Sensitive     OXACILLIN 0.5 SENSITIVE Sensitive     TETRACYCLINE >=16 RESISTANT Resistant     VANCOMYCIN 1 SENSITIVE Sensitive     TRIMETH/SULFA <=10 SENSITIVE Sensitive     CLINDAMYCIN RESISTANT Resistant     RIFAMPIN <=0.5 SENSITIVE Sensitive     Inducible Clindamycin POSITIVE Resistant     * MODERATE STAPHYLOCOCCUS AUREUS    Anti-infectives:  Anti-infectives    Start     Dose/Rate Route Frequency Ordered Stop   01/18/17 0845  ceFAZolin (ANCEF) IVPB 1 g/50 mL premix     1 g 100 mL/hr over 30 Minutes Intravenous Every 8 hours 01/18/17 0843 01/23/17 0559   01/13/17 1830  piperacillin-tazobactam (ZOSYN) IVPB 3.375 g  Status:  Discontinued     3.375 g 12.5 mL/hr over 240 Minutes Intravenous Every 8 hours 01/13/17 1016 01/18/17 0843   01/13/17 1030  piperacillin-tazobactam (ZOSYN) IVPB 3.375 g     3.375 g 100 mL/hr over 30 Minutes Intravenous  Once 01/13/17 1016 01/13/17 1125   01/10/17 1600  metroNIDAZOLE (FLAGYL) IVPB 500 mg     500 mg 100 mL/hr over 60 Minutes Intravenous  Once 01/10/17 1518 01/10/17 1629   01/10/17 1045  cefTRIAXone (ROCEPHIN) 1 g in dextrose 5 % 50 mL IVPB     1 g 100 mL/hr over 30 Minutes Intravenous  Once 01/10/17 1035 01/10/17 1248   01/10/17 1045  azithromycin (ZITHROMAX) 500 mg in dextrose 5 % 250 mL IVPB     500 mg 250 mL/hr over 60 Minutes Intravenous  Once 01/10/17 1035 01/10/17 1353   01/10/17 1045  metroNIDAZOLE (FLAGYL) IVPB 500 mg  Status:  Discontinued     500 mg 100 mL/hr over 60 Minutes Intravenous   Once 01/10/17 1035 01/10/17 1517      Best Practice/Protocols:  VTE Prophylaxis: Heparin (SQ) Continous Sedation  Consults: Treatment Team:  Tia Alert, MD   Subjective:    Overnight Issues: stable  Objective:  Vital signs for last 24 hours: Temp:  [98.6 F (37 C)-101.3 F (38.5 C)] 99.6 F (37.6 C) (03/19 0800) Pulse Rate:  [76-94] 84 (03/19 0800) Resp:  [13-21] 13 (03/19 0800) BP: (116-141)/(58-72) 141/72 (03/19 0800) SpO2:  [98 %-100 %] 100 % (03/19 0800) FiO2 (%):  [30 %] 30 % (03/19 0315) Weight:  [72.6 kg (160 lb 0.9 oz)] 72.6 kg (160 lb 0.9 oz) (03/19 0500)  Hemodynamic parameters for  last 24 hours:    Intake/Output from previous day: 03/18 0701 - 03/19 0700 In: 3265 [I.V.:1940; NG/GT:960; IV Piggyback:365] Out: 1775 [Urine:1775]  Intake/Output this shift: Total I/O In: 120 [I.V.:80; NG/GT:40] Out: -   Vent settings for last 24 hours: Vent Mode: PRVC FiO2 (%):  [30 %] 30 % Set Rate:  [20 bmp] 20 bmp Vt Set:  [450 mL] 450 mL PEEP:  [5 cmH20] 5 cmH20 Pressure Support:  [5 cmH20] 5 cmH20 Plateau Pressure:  [15 cmH20] 15 cmH20  Physical Exam:  General: on vent Neuro: PERL, not F/C, occ opens eyes and spont MVT BUE HEENT/Neck: ETT and some edema anterior neck Resp: few rhonchi B CVS: RRR GI: soft, NT, ND  Results for orders placed or performed during the hospital encounter of 01/10/17 (from the past 24 hour(s))  Glucose, capillary     Status: Abnormal   Collection Time: 01/17/17 12:20 PM  Result Value Ref Range   Glucose-Capillary 198 (H) 65 - 99 mg/dL  Glucose, capillary     Status: Abnormal   Collection Time: 01/17/17  3:41 PM  Result Value Ref Range   Glucose-Capillary 196 (H) 65 - 99 mg/dL  Glucose, capillary     Status: Abnormal   Collection Time: 01/17/17  7:47 PM  Result Value Ref Range   Glucose-Capillary 179 (H) 65 - 99 mg/dL  Glucose, capillary     Status: Abnormal   Collection Time: 01/18/17 12:10 AM  Result Value Ref Range    Glucose-Capillary 192 (H) 65 - 99 mg/dL  Glucose, capillary     Status: Abnormal   Collection Time: 01/18/17  3:30 AM  Result Value Ref Range   Glucose-Capillary 183 (H) 65 - 99 mg/dL  Glucose, capillary     Status: Abnormal   Collection Time: 01/18/17  8:01 AM  Result Value Ref Range   Glucose-Capillary 181 (H) 65 - 99 mg/dL    Assessment & Plan: Present on Admission: . Fall . Subdural hemorrhage (HCC) . Intracerebral hemorrhage (HCC)    LOS: 8 days   Additional comments:I reviewed the patient's new clinical lab test results. . Fall TBI/L SDH/B ICC/IVH - S/P decompressive craniectomy 3/15 by Dr. Franky Macho, Dr. Yetta Barre following, on Keppra Vent dependent resp failure - weaning now, will need trach but anterior neck is quite swollen, will plan later this week DM/Hyperglycemia - SSI, Lantus 15u BID ID - OSSA PNA, D/C Zosyn, Ancef for 5 more days treatment FEN - TF, decrease IVF, add free water, labs in AM ABL anemia VTE - SQ hep DIspo - ICU I spoke with his sister at the bedside. I discussed the plan for trach/PEG later this week with her as well. Critical Care Total Time*: 34 Minutes  [name], MD, MPH, [facility] Trauma: [phone] General Surgery: [phone]  01/18/2017  *Care during the described time interval was provided by me. I have reviewed this patient's available data, including medical history, events of note, physical examination and test results as part of my evaluation.  Patient ID: Maxon Kresse, male   DOB: 1966-07-14, 51 y.o.   MRN: [id number]

## 2017-01-19 ENCOUNTER — Inpatient Hospital Stay (HOSPITAL_COMMUNITY): Payer: Medicaid Other

## 2017-01-19 LAB — CBC
HEMATOCRIT: 23.3 % — AB (ref 39.0–52.0)
HEMOGLOBIN: 8.1 g/dL — AB (ref 13.0–17.0)
MCH: 30.2 pg (ref 26.0–34.0)
MCHC: 34.8 g/dL (ref 30.0–36.0)
MCV: 86.9 fL (ref 78.0–100.0)
Platelets: 325 10*3/uL (ref 150–400)
RBC: 2.68 MIL/uL — ABNORMAL LOW (ref 4.22–5.81)
RDW: 12.4 % (ref 11.5–15.5)
WBC: 8.1 10*3/uL (ref 4.0–10.5)

## 2017-01-19 LAB — GLUCOSE, CAPILLARY
GLUCOSE-CAPILLARY: 151 mg/dL — AB (ref 65–99)
GLUCOSE-CAPILLARY: 106 mg/dL — AB (ref 65–99)
Glucose-Capillary: 215 mg/dL — ABNORMAL HIGH (ref 65–99)
Glucose-Capillary: 195 mg/dL — ABNORMAL HIGH (ref 65–99)
Glucose-Capillary: 184 mg/dL — ABNORMAL HIGH (ref 65–99)
Glucose-Capillary: 103 mg/dL — ABNORMAL HIGH (ref 65–99)
Glucose-Capillary: 151 mg/dL — ABNORMAL HIGH (ref 65–99)

## 2017-01-19 LAB — BASIC METABOLIC PANEL
Anion gap: 8 (ref 5–15)
BUN: 14 mg/dL (ref 6–20)
CHLORIDE: 98 mmol/L — AB (ref 101–111)
CO2: 28 mmol/L (ref 22–32)
CREATININE: 0.58 mg/dL — AB (ref 0.61–1.24)
Calcium: 7.8 mg/dL — ABNORMAL LOW (ref 8.9–10.3)
GFR calc Af Amer: >60 mL/min (ref >60)
GFR calc non Af Amer: >60 mL/min (ref >60)
GLUCOSE: 203 mg/dL — AB (ref 65–99)
Potassium: 3.6 mmol/L (ref 3.5–5.1)
SODIUM: 134 mmol/L — AB (ref 135–145)

## 2017-01-19 MED ORDER — IPRATROPIUM-ALBUTEROL 0.5-2.5 (3) MG/3ML IN SOLN
3.0000 mL | Freq: Four times a day (QID) | RESPIRATORY_TRACT | Status: DC | PRN
  Administered 2017-01-19 – 2017-01-29 (×2): 3 mL via RESPIRATORY_TRACT
  Filled 2017-01-19 (×2): qty 3

## 2017-01-19 MED ORDER — CHLORHEXIDINE GLUCONATE CLOTH 2 % EX PADS
6.0000 | MEDICATED_PAD | Freq: Every day | CUTANEOUS | Status: DC
  Administered 2017-01-20 – 2017-02-01 (×10): 6 via TOPICAL

## 2017-01-19 MED ORDER — OSMOLITE 1.2 CAL PO LIQD
1000.0000 mL | ORAL | Status: DC
  Filled 2017-01-19 (×2): qty 1000

## 2017-01-19 MED ORDER — GLUCERNA 1.2 CAL PO LIQD
1000.0000 mL | ORAL | Status: DC
  Administered 2017-01-19 – 2017-01-31 (×10): 1000 mL
  Filled 2017-01-19 (×24): qty 1000

## 2017-01-19 NOTE — Progress Notes (Signed)
Case Management Note  Patient Details  Name: Jamie BeckwithMiguel Garcia Berg MRN: [id number] Date of Birth: [birth year]  Subjective/Objective:  Pt admitted on 01/10/17 s/p presumed fall with SDH/IPH and mastoid fracture in temporal bone.  PTA, pt independent of ADLS.                    Action/Plan: Pt remains intubated currently.  Will follow for discharge planning as pt progresses.    Expected Discharge Date:                         Expected Discharge Plan:     In-House Referral:  Clinical Social Work  Discharge planning Services  CM Consult  Post Acute Care Choice:    Choice offered to:     DME Arranged:    DME Agency:     HH Arranged:    HH Agency:     Status of Service:  In process, will continue to follow  If discussed at Long Length of Stay Meetings, dates discussed:    Additional Comments:  01/19/17 J. Deunta Beneke, RN, BSN Pt extubated successfully today.  He is following commands well, per report.  Recommend PT/OT consults when able to tolerate therapies.    Glennon MacAmerson, [place] Souter M, RN 01/12/2017, 4:38 PM

## 2017-01-19 NOTE — Progress Notes (Signed)
Follow up - Trauma Critical Care  Patient Details:    [name] is an 50 y.o. male.  Lines/tubes : Airway 8 mm (Active)  Secured at (cm) 22 cm 01/19/2017  3:50 AM  Measured From Lips 01/19/2017  3:50 AM  Secured Location Left 01/19/2017  3:50 AM  Secured By [employer] 01/19/2017  3:50 AM  Tube Holder Repositioned Yes 01/19/2017  3:50 AM  Cuff Pressure (cm H2O) 26 cm H2O 01/18/2017  7:26 PM  Site Condition Dry 01/19/2017  3:50 AM     PICC Double Lumen 01/12/17 PICC Right Basilic 42 cm 0 cm (Active)  Indication for Insertion or Continuance of Line Head or chest injuries (Tracheotomy, burns, open chest wounds);Prolonged intravenous therapies;Limited venous access - need for IV therapy >5 days (PICC only) 01/18/2017  8:00 PM  Exposed Catheter (cm) 0 cm 01/12/2017  3:28 PM  Site Assessment Clean;Dry;Intact 01/18/2017  8:00 PM  Lumen #1 Status Infusing;Flushed;Blood return noted 01/18/2017  8:00 PM  Lumen #2 Status Infusing;Flushed;Blood return noted 01/18/2017  8:00 PM  Dressing Type Transparent 01/18/2017  8:00 PM  Dressing Status Clean;Dry;Intact 01/18/2017  8:00 PM  Line Care Connections checked and tightened 01/18/2017  8:00 PM  Dressing Change Due 01/19/17 01/18/2017  8:00 PM     NG/OG Tube Orogastric 16 Fr. Center mouth (Active)  Site Assessment Clean;Dry;Intact 01/18/2017  8:00 PM  Ongoing Placement Verification No change in cm markings or external length of tube from initial placement;No change in respiratory status 01/18/2017  8:00 PM  Status Infusing tube feed 01/18/2017  8:00 PM  Drainage Appearance Cloudy;Milky 01/16/2017  8:00 PM  Intake (mL) 100 mL 01/15/2017 10:00 PM     Urethral Catheter [name] Latex 16 Fr. (Active)  Indication for Insertion or Continuance of Catheter Acute urinary retention 01/18/2017  8:00 PM  Site Assessment Clean;Intact;Dry 01/18/2017  8:00 PM  Catheter Maintenance Bag below level of bladder;Catheter secured;Drainage bag/tubing not  touching floor;Insertion date on drainage bag;No dependent loops;Seal intact 01/18/2017  8:00 PM  Collection Container Standard drainage bag 01/18/2017  8:00 PM  Securement Method Securing device (Describe) 01/18/2017  8:00 PM  Urinary Catheter Interventions Unclamped 01/18/2017  8:00 PM  Output (mL) 300 mL 01/19/2017  6:00 AM    Microbiology/Sepsis markers: Results for orders placed or performed during the hospital encounter of 01/10/17  Blood Culture (routine x 2)     Status: None   Collection Time: 01/10/17 10:35 AM  Result Value Ref Range Status   Specimen Description BLOOD RIGHT ANTECUBITAL  Final   Special Requests BOTTLES DRAWN AEROBIC AND ANAEROBIC 10CC  Final   Culture NO GROWTH 5 DAYS  Final   Report Status 01/15/2017 FINAL  Final  Blood Culture (routine x 2)     Status: None   Collection Time: 01/10/17 10:40 AM  Result Value Ref Range Status   Specimen Description BLOOD RIGHT HAND  Final   Special Requests BOTTLES DRAWN AEROBIC AND ANAEROBIC 5CC  Final   Culture NO GROWTH 5 DAYS  Final   Report Status 01/15/2017 FINAL  Final  Urine culture     Status: None   Collection Time: 01/10/17 12:43 PM  Result Value Ref Range Status   Specimen Description URINE, CATHETERIZED  Final   Special Requests NONE  Final   Culture NO GROWTH  Final   Report Status 01/11/2017 FINAL  Final  MRSA PCR Screening     Status: None   Collection Time: 01/10/17  3:21 PM  Result Value Ref Range Status   MRSA by PCR NEGATIVE NEGATIVE Final    Comment:        The GeneXpert MRSA Assay (FDA approved for NASAL specimens only), is one component of a comprehensive MRSA colonization surveillance program. It is not intended to diagnose MRSA infection nor to guide or monitor treatment for MRSA infections.   Culture, respiratory (NON-Expectorated)     Status: None   Collection Time: 01/13/17 11:17 AM  Result Value Ref Range Status   Specimen Description TRACHEAL ASPIRATE  Final   Special Requests Normal   Final   Gram Stain   Final    MODERATE WBC PRESENT, PREDOMINANTLY PMN FEW SQUAMOUS EPITHELIAL CELLS PRESENT MODERATE GRAM POSITIVE COCCI IN PAIRS FEW GRAM NEGATIVE COCCOBACILLI FEW GRAM POSITIVE RODS    Culture MODERATE STAPHYLOCOCCUS AUREUS  Final   Report Status 01/15/2017 FINAL  Final   Organism ID, Bacteria STAPHYLOCOCCUS AUREUS  Final      Susceptibility   Staphylococcus aureus - MIC*    CIPROFLOXACIN <=0.5 SENSITIVE Sensitive     ERYTHROMYCIN >=8 RESISTANT Resistant     GENTAMICIN <=0.5 SENSITIVE Sensitive     OXACILLIN 0.5 SENSITIVE Sensitive     TETRACYCLINE >=16 RESISTANT Resistant     VANCOMYCIN 1 SENSITIVE Sensitive     TRIMETH/SULFA <=10 SENSITIVE Sensitive     CLINDAMYCIN RESISTANT Resistant     RIFAMPIN <=0.5 SENSITIVE Sensitive     Inducible Clindamycin POSITIVE Resistant     * MODERATE STAPHYLOCOCCUS AUREUS    Anti-infectives:  Anti-infectives    Start     Dose/Rate Route Frequency Ordered Stop   01/18/17 0845  ceFAZolin (ANCEF) IVPB 1 g/50 mL premix     1 g 100 mL/hr over 30 Minutes Intravenous Every 8 hours 01/18/17 0843 01/23/17 0559   01/13/17 1830  piperacillin-tazobactam (ZOSYN) IVPB 3.375 g  Status:  Discontinued     3.375 g 12.5 mL/hr over 240 Minutes Intravenous Every 8 hours 01/13/17 1016 01/18/17 0843   01/13/17 1030  piperacillin-tazobactam (ZOSYN) IVPB 3.375 g     3.375 g 100 mL/hr over 30 Minutes Intravenous  Once 01/13/17 1016 01/13/17 1125   01/10/17 1600  metroNIDAZOLE (FLAGYL) IVPB 500 mg     500 mg 100 mL/hr over 60 Minutes Intravenous  Once 01/10/17 1518 01/10/17 1629   01/10/17 1045  cefTRIAXone (ROCEPHIN) 1 g in dextrose 5 % 50 mL IVPB     1 g 100 mL/hr over 30 Minutes Intravenous  Once 01/10/17 1035 01/10/17 1248   01/10/17 1045  azithromycin (ZITHROMAX) 500 mg in dextrose 5 % 250 mL IVPB     500 mg 250 mL/hr over 60 Minutes Intravenous  Once 01/10/17 1035 01/10/17 1353   01/10/17 1045  metroNIDAZOLE (FLAGYL) IVPB 500 mg  Status:   Discontinued     500 mg 100 mL/hr over 60 Minutes Intravenous  Once 01/10/17 1035 01/10/17 1517      Best Practice/Protocols:  VTE Prophylaxis: Heparin (SQ) Intermittent Sedation  Consults: Treatment Team:  Tia Alert, MD    Studies:    Events:  Subjective:    Overnight Issues:   Objective:  Vital signs for last 24 hours: Temp:  [98.5 F (36.9 C)-102.6 F (39.2 C)] 100.5 F (38.1 C) (03/20 0400) Pulse Rate:  [76-96] 76 (03/20 0600) Resp:  [12-22] 20 (03/20 0600) BP: (132-157)/(70-83) 135/74 (03/20 0600) SpO2:  [91 %-100 %] 100 % (03/20 0600) FiO2 (%):  [30 %] 30 % (03/20 0400) Weight:  [  70.1 kg (154 lb 8.7 oz)] 70.1 kg (154 lb 8.7 oz) (03/20 0500)  Hemodynamic parameters for last 24 hours:    Intake/Output from previous day: 03/19 0701 - 03/20 0700 In: 2346.4 [I.V.:467.7; NG/GT:1356.3; IV Piggyback:522.5] Out: 2200 [Urine:2200]  Intake/Output this shift: No intake/output data recorded.  Vent settings for last 24 hours: Vent Mode: PRVC FiO2 (%):  [30 %] 30 % Set Rate:  [20 bmp] 20 bmp Vt Set:  [450 mL] 450 mL PEEP:  [5 cmH20] 5 cmH20 Pressure Support:  [8 cmH20] 8 cmH20 Plateau Pressure:  [13 cmH20-18 cmH20] 18 cmH20  Physical Exam:  General: on vent Neuro: awake, F/C well with RUE and some with LUE HEENT/Neck: ETT and still some edema anterior neck Resp: clear to auscultation bilaterally CVS: RRR GI: soft, NT, bone flap  Results for orders placed or performed during the hospital encounter of 01/10/17 (from the past 24 hour(s))  Glucose, capillary     Status: Abnormal   Collection Time: 01/18/17 11:17 AM  Result Value Ref Range   Glucose-Capillary 215 (H) 65 - 99 mg/dL   Comment 1 Notify RN    Comment 2 Document in Chart   Glucose, capillary     Status: Abnormal   Collection Time: 01/18/17  3:36 PM  Result Value Ref Range   Glucose-Capillary 148 (H) 65 - 99 mg/dL   Comment 1 Notify RN    Comment 2 Document in Chart   Glucose, capillary      Status: Abnormal   Collection Time: 01/18/17  8:10 PM  Result Value Ref Range   Glucose-Capillary 168 (H) 65 - 99 mg/dL   Comment 1 Notify RN    Comment 2 Document in Chart   Glucose, capillary     Status: Abnormal   Collection Time: 01/19/17  1:01 AM  Result Value Ref Range   Glucose-Capillary 215 (H) 65 - 99 mg/dL  Glucose, capillary     Status: Abnormal   Collection Time: 01/19/17  4:12 AM  Result Value Ref Range   Glucose-Capillary 195 (H) 65 - 99 mg/dL  CBC     Status: Abnormal   Collection Time: 01/19/17  4:30 AM  Result Value Ref Range   WBC 8.1 4.0 - 10.5 K/uL   RBC 2.68 (L) 4.22 - 5.81 MIL/uL   Hemoglobin 8.1 (L) 13.0 - 17.0 g/dL   HCT 16.123.3 (L) 09.639.0 - 04.552.0 %   MCV 86.9 78.0 - 100.0 fL   MCH 30.2 26.0 - 34.0 pg   MCHC 34.8 30.0 - 36.0 g/dL   RDW 40.912.4 81.111.5 - 91.415.5 %   Platelets 325 150 - 400 K/uL  Basic metabolic panel     Status: Abnormal   Collection Time: 01/19/17  4:30 AM  Result Value Ref Range   Sodium 134 (L) 135 - 145 mmol/L   Potassium 3.6 3.5 - 5.1 mmol/L   Chloride 98 (L) 101 - 111 mmol/L   CO2 28 22 - 32 mmol/L   Glucose, Bld 203 (H) 65 - 99 mg/dL   BUN 14 6 - 20 mg/dL   Creatinine, Ser 7.820.58 (L) 0.61 - 1.24 mg/dL   Calcium 7.8 (L) 8.9 - 10.3 mg/dL   GFR calc non Af Amer >60 >60 mL/min   GFR calc Af Amer >60 >60 mL/min   Anion gap 8 5 - 15  Glucose, capillary     Status: Abnormal   Collection Time: 01/19/17  8:01 AM  Result Value Ref Range   Glucose-Capillary 184 (  H) 65 - 99 mg/dL   Comment 1 Notify RN    Comment 2 Document in Chart     Assessment & Plan: Present on Admission: . Fall . Subdural hemorrhage (HCC) . Intracerebral hemorrhage (HCC)    LOS: 9 days   Additional comments:I reviewed the patient's new clinical lab test results. . Fall TBI/L SDH/B ICC/IVH - S/P decompressive craniectomy 3/15 by Dr. Franky Macho, Dr. Yetta Barre following, on Keppra. Exam has improved. Vent dependent resp failure - weaned well yesterday, hope to extubate this  AM now that his neuro status has improved DM/Hyperglycemia - SSI, Lantus 15u BID ID - Ancef for OSSA PNA FEN - stop TF and aspirate stomach with extubation plan, stop free water ABL anemia VTE - SQ hep DIspo - ICU I spoke with his sister at the bedside. Critical Care Total Time*: 32 Minutes  [name], MD, MPH, [facility] Trauma: [phone] General Surgery: [phone]  01/19/2017  *Care during the described time interval was provided by me. I have reviewed this patient's available data, including medical history, events of note, physical examination and test results as part of my evaluation.  Patient ID: [name], male   DOB: [birth year], 51 y.o.   MRN: [id number]

## 2017-01-19 NOTE — Progress Notes (Signed)
NEUROSURGERY PROGRESS NOTE  Patient purposeful in upper extremities. Follows commands in uppers when spoken to in spanish. Opening eyes spontaneously. Incision CDI.   Temp:  [98.5 F (36.9 C)-102.6 F (39.2 C)] 100.5 F (38.1 C) (03/20 0400) Pulse Rate:  [76-96] 76 (03/20 0600) Resp:  [12-22] 20 (03/20 0600) BP: (132-157)/(70-83) 135/74 (03/20 0600) SpO2:  [91 %-100 %] 100 % (03/20 0600) FiO2 (%):  [30 %] 30 % (03/20 0400) Weight:  [70.1 kg (154 lb 8.7 oz)] 70.1 kg (154 lb 8.7 oz) (03/20 0500)   Sherryl MangesKimberly Hannah Meyran, NP 01/19/2017 8:17 AM

## 2017-01-19 NOTE — Progress Notes (Signed)
50mL of fentanyl wasted in sink with Vianne Bullsich [place], RN witness

## 2017-01-19 NOTE — Procedures (Signed)
Extubation Procedure Note  Patient Details:   Name: [name] DOB: 31-Aug-1966 MRN: [id number]   Airway Documentation:     Evaluation  O2 sats: stable throughout Complications: No apparent complications Patient did tolerate procedure well. Bilateral Breath Sounds: Expiratory wheezes, Stridor   No   Positive cuff leak noted. Pt placed on [place] 4 L with humidity, some stridor/wheeze noted.  MD ordered Duoneb prn. Pt tolerating well at this time. Family present.  Rayburn FeltJean S Jazz Biddy 01/19/2017, 10:17 AM

## 2017-01-20 LAB — BASIC METABOLIC PANEL
ANION GAP: 9 (ref 5–15)
BUN: 16 mg/dL (ref 6–20)
CHLORIDE: 96 mmol/L — AB (ref 101–111)
CO2: 27 mmol/L (ref 22–32)
Calcium: 8.2 mg/dL — ABNORMAL LOW (ref 8.9–10.3)
Creatinine, Ser: 0.52 mg/dL — ABNORMAL LOW (ref 0.61–1.24)
Glucose, Bld: 199 mg/dL — ABNORMAL HIGH (ref 65–99)
POTASSIUM: 3.9 mmol/L (ref 3.5–5.1)
Sodium: 132 mmol/L — ABNORMAL LOW (ref 135–145)

## 2017-01-20 LAB — GLUCOSE, CAPILLARY
GLUCOSE-CAPILLARY: 167 mg/dL — AB (ref 65–99)
GLUCOSE-CAPILLARY: 190 mg/dL — AB (ref 65–99)
GLUCOSE-CAPILLARY: 225 mg/dL — AB (ref 65–99)
GLUCOSE-CAPILLARY: 171 mg/dL — AB (ref 65–99)
Glucose-Capillary: 192 mg/dL — ABNORMAL HIGH (ref 65–99)
Glucose-Capillary: 194 mg/dL — ABNORMAL HIGH (ref 65–99)

## 2017-01-20 MED ORDER — CHLORHEXIDINE GLUCONATE 0.12 % MT SOLN
15.0000 mL | Freq: Two times a day (BID) | OROMUCOSAL | Status: DC
  Administered 2017-01-20 – 2017-02-01 (×25): 15 mL via OROMUCOSAL
  Filled 2017-01-20 (×24): qty 15

## 2017-01-20 MED ORDER — BETHANECHOL CHLORIDE 10 MG PO TABS
10.0000 mg | ORAL_TABLET | Freq: Three times a day (TID) | ORAL | Status: DC
  Administered 2017-01-20 – 2017-01-26 (×20): 10 mg via ORAL
  Filled 2017-01-20 (×22): qty 1

## 2017-01-20 MED ORDER — ORAL CARE MOUTH RINSE
15.0000 mL | Freq: Two times a day (BID) | OROMUCOSAL | Status: DC
  Administered 2017-01-20 – 2017-02-01 (×20): 15 mL via OROMUCOSAL

## 2017-01-20 NOTE — Evaluation (Addendum)
Speech Language Pathology Evaluation Patient Details Name: Jamie BeckwithMiguel Garcia Berg MRN: [id number] DOB: September 09, 1966 Today's Date: 01/20/2017 Time: [id number] SLP Time Calculation (min) (ACUTE ONLY): 27 min  Problem List:  Patient Active Problem List   Diagnosis Date Noted  . Intracerebral hemorrhage (HCC) 01/14/2017  . Fall 01/10/2017  . Subdural hemorrhage (HCC) 01/10/2017  . Diabetes type 2, uncontrolled (HCC) 06/13/2015   Past Medical History:  Past Medical History:  Diagnosis Date  . Diabetes mellitus without complication [facility](HCC)    Past Surgical History:  Past Surgical History:  Procedure Laterality Date  . CRANIOTOMY Right 01/13/2017   Procedure: 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, Placement of bone flap in abdomen;  Surgeon: Coletta MemosKyle Cabbell, MD;  [facility];  Service: Neurosurgery;  Laterality: Right;   HPI:  Pt is a 51 y.o. male admitted to ED on 01/10/17 after being found down from a supposed fall. CT 3/11 shows R frontal intraparenchymal hemorrhage, R SDH, SAH, and midline shift; non-displaced L temporal bone fx; L mastoid bone fx. CT spine clear. CT 3/14 shows additional L temporal hemorrhagic contusion. S/p frontotemporal parietal craniotomy with bone flab in abdomen 3/15. CXR 3/18 shows hypoinflated lungs with potential infection. Intubated 3/11-3/20.    Assessment / Plan / Recommendation Clinical Impression  Pt presents most consistently as a Rancho level II (generlized response), although as session continued and his arousal started to improve, he did show some signs of more localized repsonses. He is inattentive to his right side, but does withdrawal to pain in RUE and he did follow a few simple commands with his BUE with interpreter present for translation. There were no attempts to vocalize or communicate despite Max cues. Education was initiated with his sister, who was present throughout session. Pt will benefit from additional SLP f/u to  maximize functional cognition and communication.     SLP Assessment  SLP Recommendation/Assessment: Patient needs continued Speech Lanaguage Pathology Services SLP Visit Diagnosis: Cognitive communication deficit (R41.841)    Follow Up Recommendations  Inpatient Rehab (pending progression)    Frequency and Duration min 2x/week  2 weeks      SLP Evaluation Cognition  Overall Cognitive Status: Impaired/Different from baseline Arousal/Alertness: Lethargic Orientation Level: Intubated/Tracheostomy - Unable to assess Attention: Focused Focused Attention: Impaired Focused Attention Impairment: Verbal basic;Functional basic Problem Solving: Impaired Problem Solving Impairment: Functional basic Safety/Judgment: Impaired [place] [address] Scales of Cognitive Functioning: Generalized response (some emerging III)       Comprehension  Auditory Comprehension Overall Auditory Comprehension: Impaired Commands: Impaired One Step Basic Commands: 25-49% accurate Interfering Components: Attention;Processing speed;Other (comment) (lethargy)    Expression Expression Primary Mode of Expression: Other (comment) (no attempts to communicate) Verbal Expression Overall Verbal Expression: Impaired Initiation: Impaired Automatic Speech:  (none) Level of Generative/Spontaneous Verbalization:  (none) Non-Verbal Means of Communication: Other (comment) (no attempts)   Oral / Motor  Motor Speech Overall Motor Speech: Other (comment) (not observed)   GO                    Maxcine Hamaiewonsky, Rayetta Veith 01/20/2017, 3:45 PM  Maxcine HamLaura Paiewonsky, M.A. CCC-SLP [phone]

## 2017-01-20 NOTE — Progress Notes (Signed)
Inpatient Rehabilitation  Per PT request, patient was screened by Douglas Rooks for appropriateness for an Inpatient Acute Rehab consult.  At this time we are recommending an Inpatient Rehab consult.  Please order if you are agreeable.    [name], M.A., CCC/SLP Admission Coordinator  [facility]  Cell [phone]  

## 2017-01-20 NOTE — Progress Notes (Signed)
Trauma Service Note  Subjective: Patient very sleepy this morning.  Will not follow any commands for me today.  Objective: Vital signs in last 24 hours: Temp:  [98.6 F (37 C)-99.9 F (37.7 C)] 99.4 F (37.4 C) (03/21 0800) Pulse Rate:  [76-96] 86 (03/21 0800) Resp:  [13-25] 15 (03/21 0800) BP: (127-156)/(67-81) 127/67 (03/21 0800) SpO2:  [92 %-100 %] 99 % (03/21 0800) Weight:  [66 kg (145 lb 8.1 oz)] 66 kg (145 lb 8.1 oz) (03/21 0500) Last BM Date: 01/14/17  Intake/Output from previous day: 03/20 0701 - 03/21 0700 In: 1232.7 [I.V.:270; NG/GT:597.7; IV Piggyback:365] Out: 4255 [Urine:4255] Intake/Output this shift: Total I/O In: 65 [I.V.:10; NG/GT:55] Out: -   General: No distress, but extremely somnolent.  Lungs: Clear  Abd: tolerating tube feedings well.  Extremities: No changes  Neuro: Sleepy.  Flap is not tense, but not soft also  Mild hyponatremia Lab Results: CBC   Recent Labs  01/19/17 0430  WBC 8.1  HGB 8.1*  HCT 23.3*  PLT 325   BMET  Recent Labs  01/19/17 0430 01/20/17 0627  NA 134* 132*  K 3.6 3.9  CL 98* 96*  CO2 28 27  GLUCOSE 203* 199*  BUN 14 16  CREATININE 0.58* 0.52*  CALCIUM 7.8* 8.2*   PT/INR No results for input(s): LABPROT, INR in the last 72 hours. ABG No results for input(s): PHART, HCO3 in the last 72 hours.  Invalid input(s): PCO2, PO2  Studies/Results: Dg Abd Portable 1v  Result Date: 01/19/2017 CLINICAL DATA:  Check for feeding catheter placement EXAM: PORTABLE ABDOMEN - 1 VIEW COMPARISON:  01/17/2017 FINDINGS: Previously seen nasogastric catheter has been removed and a feeding catheter has been placed in lies within the junction of the third and fourth portions of the duodenum just short of the ligamenta trans. Scattered large and small bowel gas is noted. No obstructive changes are seen. No bony abnormality is noted. Rounded density is noted over the right mid abdomen likely extrinsic to the patient. Clinical  correlation is recommended. IMPRESSION: Feeding catheter within the distal duodenum as described. Vague rounded density overlying the right mid abdomen laterally likely related to extrinsic artifact. Clinical correlation is recommended. Electronically Signed   By: Alcide CleverMark  Lukens M.D.   On: 01/19/2017 16:11    Anti-infectives: Anti-infectives    Start     Dose/Rate Route Frequency Ordered Stop   01/18/17 0845  ceFAZolin (ANCEF) IVPB 1 g/50 mL premix     1 g 100 mL/hr over 30 Minutes Intravenous Every 8 hours 01/18/17 0843 01/23/17 0559   01/13/17 1830  piperacillin-tazobactam (ZOSYN) IVPB 3.375 g  Status:  Discontinued     3.375 g 12.5 mL/hr over 240 Minutes Intravenous Every 8 hours 01/13/17 1016 01/18/17 0843   01/13/17 1030  piperacillin-tazobactam (ZOSYN) IVPB 3.375 g     3.375 g 100 mL/hr over 30 Minutes Intravenous  Once 01/13/17 1016 01/13/17 1125   01/10/17 1600  metroNIDAZOLE (FLAGYL) IVPB 500 mg     500 mg 100 mL/hr over 60 Minutes Intravenous  Once 01/10/17 1518 01/10/17 1629   01/10/17 1045  cefTRIAXone (ROCEPHIN) 1 g in dextrose 5 % 50 mL IVPB     1 g 100 mL/hr over 30 Minutes Intravenous  Once 01/10/17 1035 01/10/17 1248   01/10/17 1045  azithromycin (ZITHROMAX) 500 mg in dextrose 5 % 250 mL IVPB     500 mg 250 mL/hr over 60 Minutes Intravenous  Once 01/10/17 1035 01/10/17 1353   01/10/17  1045  metroNIDAZOLE (FLAGYL) IVPB 500 mg  Status:  Discontinued     500 mg 100 mL/hr over 60 Minutes Intravenous  Once 01/10/17 1035 01/10/17 1517      Assessment/Plan: s/p Procedure(s): 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, Placement of bone flap in abdomen Swallowing evaluation, keep Cortrak tube until passes swallowing evaluation  Keep in ICU for now.  Patient too somnolent for SDU or floor Mild hyponatremia  LOS: 10 days   Marta Lamas. Gae Bon, MD, FACS [phone] Trauma Surgeon 01/20/2017

## 2017-01-20 NOTE — Progress Notes (Signed)
Nutrition Follow-up  INTERVENTION:   Continue: Glucerna 1.2 @ 55 ml/hr (1320 ml/day) 30 ml Prostat BID MVI daily Provides 1784 kcal, 109 grams protein, and 1063 ml free water.   Once IVF's are d/c'ed recommend: 200 ml free water every 6 hours  NUTRITION DIAGNOSIS:   Increased nutrient needs related to  (TBI) as evidenced by estimated needs. Ongoing.   GOAL:   Patient will meet greater than or equal to 90% of their needs Met.   MONITOR:   TF tolerance, Diet advancement, Labs  ASSESSMENT:   Pt admitted after a fall while intoxicated with TBI, L SDH, B ICC, IVH. Pt with hyperglycemia on admission with hx of DM.   3/20 extubated, Cortrak placed (tip at LOT) Na 132 CBG's: 190-225-192 Pt spanish speaking, no family in the room  NS @ 50 ml/hr  Diet Order:  Diet NPO time specified  Skin:   (head and abd incisions)  Last BM:  3/15  Height:   Ht Readings from Last 1 Encounters:  01/10/17 _0  (1.549 m)    Weight:   Wt Readings from Last 1 Encounters:  01/20/17 145 lb 8.1 oz (66 kg)    Ideal Body Weight:  59 kg  BMI:  Body mass index is 27.49 kg/m.  Estimated Nutritional Needs:   Kcal:  1700-1900  Protein:  100-120 grams  Fluid:  > 1.8 L/day  EDUCATION NEEDS:   No education needs identified at this time  [place], [place], [place] Pager [phone] After Hours Pager

## 2017-01-20 NOTE — Progress Notes (Signed)
OT Cancellation Note  Patient Details Name: Jamie BeckwithMiguel Garcia Cardenas MRN: [id number] DOB: 1966-10-13   Cancelled Treatment:    Reason Eval/Treat Not Completed: Patient not medically ready. Pt on bedrest. Please update activity orders when pt appropriate for therapy. Thanks  [facility],HILLARY  Terianne Thaker, OT/L  [phone] 01/20/2017 01/20/2017, 7:43 AM

## 2017-01-20 NOTE — Evaluation (Signed)
Physical Therapy Evaluation Patient Details Name: Loni BeckwithMiguel Garcia Cardenas MRN: [id number] DOB: 1966/02/06 Today's Date: 01/20/2017   History of Present Illness  Pt is a 51 y.o. male admitted to ED on 01/10/17 after being found down from a supposed fall. CT 3/11 shows R frontal intraparenchymal hemorrhage, R SDH, SAH, and midline shift; non-displaced L temporal bone fx; L mastoid bone fx. CT spine clear. CT 3/14 shows additional L temporal hemorrhagic contusion. S/p frontotemporal parietal craniotomy with bone flab in abdomen 3/15. CXR 3/18 shows hypoinflated lungs with potential infection. Intubated 3/11-3/20.   Clinical Impression  Pt presents to PT with R-side inattention, lack of active movement in BLE, decreased arousal, decreased awareness, and an overall decrease in functional mobility secondary to above. Sister present throughout session and reports that PTA pt was indep and living with a roommate; she plans to have pt move in with her upon d/c so she can provide 24/7 care. Today, pt requiring totalA to sit EOB, inconsistently following commands to open eyes; intermittent purposeful response in BUE to painful stimulus; Rancho level II (emerging III). Pt Spanish-speaking. Pt would benefit from continued PT services to further assess mobility.     Follow Up Recommendations CIR;Supervision/Assistance - 24 hour    Equipment Recommendations  Other (comment) (Defer to next venue)    Recommendations for Other Services Rehab consult;OT consult     Precautions / Restrictions Precautions Precautions: Fall Restrictions Weight Bearing Restrictions: No      Mobility  Bed Mobility Overal bed mobility: Needs Assistance Bed Mobility: Supine to Sit;Sit to Supine     Supine to sit: Total assist Sit to supine: Total assist      Transfers                    Ambulation/Gait                Stairs            Wheelchair Mobility    Modified Rankin (Stroke Patients  Only) Modified Rankin (Stroke Patients Only) Pre-Morbid Rankin Score: No symptoms Modified Rankin: Severe disability     Balance Overall balance assessment: Needs assistance Sitting-balance support: Bilateral upper extremity supported;Feet supported Sitting balance-Leahy Scale: Zero                                       Pertinent Vitals/Pain Pain Assessment: Faces Faces Pain Scale: Hurts a little bit Pain Location: Head Pain Descriptors / Indicators: Grimacing Pain Intervention(s): Monitored during session;Repositioned    Home Living Family/patient expects to be discharged to:: Private residence Living Arrangements: Other relatives (Sister) Available Help at Discharge: Family;Available 24 hours/day Type of Home: House Home Access: Level entry     Home Layout: Two level;Able to live on main level with bedroom/bathroom;Other (Comment) Forensic scientist(Shower upstairs) Home Equipment: None Additional Comments: Pt only Spanish-speaking. Sister reports she plans to have pt live with her upon d/c, as she will be available for 24/7 assist.     Prior Function Level of Independence: Independent         Comments: According to sister, 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     Hand Dominance        Extremity/Trunk Assessment   Upper Extremity Assessment Upper Extremity Assessment: RUE deficits/detail;LUE deficits/detail;Difficult to assess due to impaired cognition RUE Deficits / Details: Nonpurposeful movement of R fingers and wrist LUE Deficits /  Details: Purposeful movement of L fingers, wrist, and bicep    Lower Extremity Assessment Lower Extremity Assessment: RLE deficits/detail;LLE deficits/detail;Difficult to assess due to impaired cognition RLE Deficits / Details: No active movement in RLE; low tone LLE Deficits / Details: No active movement in LLE; low tone    Cervical / Trunk Assessment Cervical / Trunk Assessment: Other  exceptions Cervical / Trunk Exceptions: Impaired neck and trunk control  Communication   Communication: Interpreter utilized;Expressive difficulties (Spanish; pt unable to communicate today)  Cognition Arousal/Alertness: Lethargic Behavior During Therapy: Flat affect Overall Cognitive Status: Impaired/Different from baseline Area of Impairment: Attention;Following commands;Awareness;Problem solving;Rancho level;Orientation Orientation Level: Disoriented to Current Attention Level: Focused   Following Commands: Follows one step commands inconsistently   Awareness: Intellectual Problem Solving: Slow processing;Decreased initiation;Requires verbal cues;Requires tactile cues General Comments: Decreased ability to participate with therapy today, requiring max multimodal cues to open eyes; inconsistently follows one-step commands to open eyes and look at therapist. Able to visually track therapist to L-side and midline; intermittent tracking to R-side. Nonpurposeful response with fingers to painful stimulus in BUE; no response in BLE.     General Comments General comments (skin integrity, edema, etc.): At rest: BP 126/78, MAP 89. Post-mobility: BP 124/102, MAP 107. HR up to 100s with sitting.    Exercises     Assessment/Plan    PT Assessment Patient needs continued PT services  PT Problem List Decreased strength;Decreased mobility;Impaired tone;Decreased range of motion;Decreased coordination;Decreased activity tolerance;Decreased cognition;Cardiopulmonary status limiting activity;Decreased balance;Decreased skin integrity       PT Treatment Interventions DME instruction;Gait training;Stair training;Functional mobility training;Neuromuscular re-education;Balance training;Therapeutic exercise;Therapeutic activities;Patient/family education;Cognitive remediation    PT Goals (Current goals can be found in the Care Plan section)  Acute Rehab PT Goals PT Goal Formulation: Patient unable to  participate in goal setting Time For Goal Achievement: 02/03/17    Frequency Min 4X/week   Barriers to discharge        Co-evaluation               End of Session Equipment Utilized During Treatment: Gait belt;Oxygen Activity Tolerance: Patient limited by lethargy Patient left: in bed;with call bell/phone within reach;with family/visitor present;with SCD's reapplied Nurse Communication: Mobility status PT Visit Diagnosis: Other abnormalities of gait and mobility (R26.89);Muscle weakness (generalized) (M62.81)         Time: 1610-9604 PT Time Calculation (min) (ACUTE ONLY): 43 min   Charges:   PT Evaluation $PT Eval Moderate Complexity: 1 Procedure PT Treatments $Therapeutic Activity: 23-37 mins   PT G Codes:       Dewayne Hatch, SPT Office-[phone]  Ina Homes 01/20/2017, 3:29 PM

## 2017-01-20 NOTE — Progress Notes (Signed)
  NEUROSURGERY PROGRESS NOTE  Patient extubated on nasal cannula. Follow commands in upper extremities when spoken to in spanish  Incision CDI. Doing well.   Temp:  [98.6 F (37 C)-99.9 F (37.7 C)] 99.4 F (37.4 C) (03/21 0800) Pulse Rate:  [73-96] 86 (03/21 0800) Resp:  [13-25] 15 (03/21 0800) BP: (127-156)/(67-81) 127/67 (03/21 0800) SpO2:  [92 %-100 %] 99 % (03/21 0800) Weight:  [66 kg (145 lb 8.1 oz)] 66 kg (145 lb 8.1 oz) (03/21 0500)  Jamie MangesKimberly Hannah Ezekial Arns, NP 01/20/2017 8:44 AM

## 2017-01-20 NOTE — Evaluation (Signed)
Clinical/Bedside Swallow Evaluation Patient Details  Name: [name] MRN: [id number] Date of Birth: January 29, 1966  Today's Date: 01/20/2017 Time: SLP Start Time (ACUTE ONLY): 1112 SLP Stop Time (ACUTE ONLY): 1139 SLP Time Calculation (min) (ACUTE ONLY): 27 min  Past Medical History:  Past Medical History:  Diagnosis Date  . Diabetes mellitus without complication [facility](HCC)    Past Surgical History:  Past Surgical History:  Procedure Laterality Date  . CRANIOTOMY Right 01/13/2017   Procedure: 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, Placement of bone flap in abdomen;  Surgeon: Jamie MemosKyle Cabbell, MD;  [facility];  Service: Neurosurgery;  Laterality: Right;   HPI:  Pt is a [age] y.o. male admitted to ED on 01/10/17 after being found down from a supposed fall. CT 3/11 shows R frontal intraparenchymal hemorrhage, R SDH, SAH, and midline shift; non-displaced L temporal bone fx; L mastoid bone fx. CT spine clear. CT 3/14 shows additional L temporal hemorrhagic contusion. S/p frontotemporal parietal craniotomy with bone flab in abdomen 3/15. CXR 3/18 shows hypoinflated lungs with potential infection. Intubated 3/11-3/20.    Assessment / Plan / Recommendation Clinical Impression  Pt has audible wetness at baseline and intermittent, wet coughing, concerning for decreased management of his secretions. No attempts to vocalize were noted, therefore vocal quality could not be better assessed s/p prolonged intubation. SLP presented ice chip trial with Total A for self-feeding, but pt made no oral movements and no attempts to accept POs. His current mentation does not allow for safe oral intake - would remain NPO. SLP Visit Diagnosis: Dysphagia, unspecified (R13.10)    Aspiration Risk  Severe aspiration risk    Diet Recommendation NPO;Alternative means - temporary   Medication Administration: Via alternative means    Other  Recommendations Oral Care Recommendations: Oral  care QID Other Recommendations: Have oral suction available   Follow up Recommendations Inpatient Rehab (pending progression)      Frequency and Duration min 2x/week  2 weeks       Prognosis Prognosis for Safe Diet Advancement: Fair Barriers to Reach Goals: Cognitive deficits;Severity of deficits      Swallow Study   General HPI: Pt is a 51 y.o. male admitted to ED on 01/10/17 after being found down from a supposed fall. CT 3/11 shows R frontal intraparenchymal hemorrhage, R SDH, SAH, and midline shift; non-displaced L temporal bone fx; L mastoid bone fx. CT spine clear. CT 3/14 shows additional L temporal hemorrhagic contusion. S/p frontotemporal parietal craniotomy with bone flab in abdomen 3/15. CXR 3/18 shows hypoinflated lungs with potential infection. Intubated 3/11-3/20.  Type of Study: Bedside Swallow Evaluation Previous Swallow Assessment: none in chart Diet Prior to this Study: NPO;NG Tube Temperature Spikes Noted: No Respiratory Status: Nasal cannula History of Recent Intubation: Yes Length of Intubations (days): 9 days Date extubated: 01/19/17 Behavior/Cognition: Lethargic/Drowsy;Requires cueing Oral Care Completed by SLP: Yes Oral Cavity - Dentition: Poor condition Self-Feeding Abilities: Total assist Patient Positioning: Other (comment) (EOB) Baseline Vocal Quality: Not observed Volitional Cough: Cognitively unable to elicit Volitional Swallow: Unable to elicit    Oral/Motor/Sensory Function     Ice Chips Ice chips: Impaired Presentation: Spoon Oral Phase Impairments: Poor awareness of bolus;Other (comment) (no oral acceptance)   Thin Liquid Thin Liquid: Not tested    Nectar Thick Nectar Thick Liquid: Not tested   Honey Thick Honey Thick Liquid: Not tested   Puree Puree: Not tested   Solid   GO   Solid: Not tested  [name] 01/20/2017,3:50 PM  [name], M.A. CCC-SLP [phone]

## 2017-01-21 ENCOUNTER — Inpatient Hospital Stay (HOSPITAL_COMMUNITY): Payer: Medicaid Other

## 2017-01-21 DIAGNOSIS — S069X3S Unspecified intracranial injury with loss of consciousness of 1 hour to 5 hours 59 minutes, sequela: Secondary | ICD-10-CM

## 2017-01-21 LAB — BASIC METABOLIC PANEL
ANION GAP: 9 (ref 5–15)
BUN: 17 mg/dL (ref 6–20)
CHLORIDE: 103 mmol/L (ref 101–111)
CO2: 26 mmol/L (ref 22–32)
Calcium: 7.8 mg/dL — ABNORMAL LOW (ref 8.9–10.3)
Creatinine, Ser: 0.43 mg/dL — ABNORMAL LOW (ref 0.61–1.24)
Glucose, Bld: 157 mg/dL — ABNORMAL HIGH (ref 65–99)
POTASSIUM: 3.8 mmol/L (ref 3.5–5.1)
SODIUM: 138 mmol/L (ref 135–145)

## 2017-01-21 LAB — GLUCOSE, CAPILLARY
GLUCOSE-CAPILLARY: 153 mg/dL — AB (ref 65–99)
GLUCOSE-CAPILLARY: 139 mg/dL — AB (ref 65–99)
GLUCOSE-CAPILLARY: 119 mg/dL — AB (ref 65–99)
Glucose-Capillary: 220 mg/dL — ABNORMAL HIGH (ref 65–99)

## 2017-01-21 MED ORDER — HYDROCODONE-ACETAMINOPHEN 7.5-325 MG/15ML PO SOLN
15.0000 mL | ORAL | Status: DC | PRN
  Administered 2017-01-24 – 2017-01-25 (×4): 15 mL
  Filled 2017-01-21 (×5): qty 15

## 2017-01-21 NOTE — Progress Notes (Signed)
  Speech Language Pathology Treatment: Cognitive-Linquistic  Patient Details Name: Jamie BeckwithMiguel Garcia Berg MRN: [id number] DOB: 1966-04-06 Today's Date: 01/21/2017 Time: [id number] SLP Time Calculation (min) (ACUTE ONLY): 48 min  Assessment / Plan / Recommendation Clinical Impression  Pt presents as a Rancho level III (localized response) with improved alertness and increased restlessness this session. He is moving his L > R UE but does not move BLE, even with painful stimulus. No attempts to communicate again today, but his sister believes he mouthed her name. He has fleeting sustained attention with cueing, as he is easily distracted by his environment. He spontaneously brought the wash cloth to his mouth to wipe his lips and followed command to "give me the wash cloth". TBI book was given to his sister today, who was present and assisted with language barrier today (although she reports that he did speak English fairly well PTA). Will continue to follow.   HPI HPI: Pt is a 51 y.o. male admitted to ED on 01/10/17 after being found down from a supposed fall. CT 3/11 shows R frontal intraparenchymal hemorrhage, R SDH, SAH, and midline shift; non-displaced L temporal bone fx; L mastoid bone fx. CT spine clear. CT 3/14 shows additional L temporal hemorrhagic contusion. S/p frontotemporal parietal craniotomy with bone flab in abdomen 3/15. CXR 3/18 shows hypoinflated lungs with potential infection. Intubated 3/11-3/20.       SLP Plan  Continue with current plan of care       Recommendations  Diet recommendations: NPO Medication Administration: Via alternative means                Oral Care Recommendations: Oral care QID Follow up Recommendations: Inpatient Rehab SLP Visit Diagnosis: Cognitive communication deficit (H08.657(R41.841) Plan: Continue with current plan of care       GO                [name], [name] 01/21/2017, 3:42 PM  [name], M.A. CCC-SLP [phone]

## 2017-01-21 NOTE — H&P (Signed)
Physical Medicine and Rehabilitation Admission H&P    Chief Complaint  Patient presents with  . Altered Mental Status  : HPI: [name] is a [name] with history of hypertension, diabetes mellitus. Per chart review patient lives with sister was independent prior to admission.Plan is for sister and brother-in-law to provide assistance as needed at time of discharge. Presented 01/10/2017 fter being found down from a supposed fall. CT of the head and imaging revealed intracranial hemorrhage with a 4.5 cm right frontal intraparenchymal hemorrhage, 6 mm right subdural hematoma and other scattered areas of intraparenchymal and subarachnoid hemorrhage with right to left midline shift. Nondisplaced vertical fracture through the left temporal bone.CT cervical spine negative.CT abdomen and pelvis with no evidence of acute injury.UDS negative.neurosurgery Dr. Sherley Bounds advised conservative care with ICP monitor.follow-up ENT Dr. Redmond Baseman for temporal bone fracture no surgical intervention required.follow-up CT imaging showed increased right to left shift greater than 1 cm. Basal cisterns slightly effaced.Patient With steady rise in his ICP and not responsive to heavy sedation. .Underwent right frontal temporal parietal craniectomy for hematoma evacuation 01/14/2017 per Dr. Cyndy Freeze with placement of bone flap in the abdomen. Keppra for seizure prophylaxis.Patient was extubated 01/19/2017.Nasogastric tube in place for nutritional support patient remains NPO. Later placed on subcutaneous heparin for DVT prophylaxis. Placed on Ancef 01/18/2017 for suspected pneumonia 5 days. Bouts of urinary retention with Urecholine added as well as greater than 100,000 Escherichia coli UTI placed on Cipro 01/28/2017. Physical therapy evaluation completed with recommendations of physical medicine rehabilitation consult.patient was admitted for a comprehensive rehabilitation  program  Review of Systems  Unable to perform ROS: Acuity of condition   Past Medical History:  Diagnosis Date  . Diabetes mellitus without complication [facility])    Past Surgical History:  Procedure Laterality Date  . CRANIOTOMY Right 01/13/2017   Procedure: 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, Placement of bone flap in abdomen;  Surgeon: Ashok Pall, MD;  Location: [facility];  Service: Neurosurgery;  Laterality: Right;   Family History  Problem Relation Age of Onset  . Diabetes Mother   . Diabetes Father    Social History:  reports that he has never smoked. He has never used smokeless tobacco. He reports that he drinks alcohol. He reports that he does not use drugs. Allergies: No Known Allergies Medications Prior to Admission  Medication Sig Dispense Refill  . metFORMIN (GLUCOPHAGE) 500 MG tablet Take 500 mg by mouth 2 (two) times daily with a meal.    . aspirin EC 81 MG tablet Take 1 tablet (81 mg total) by mouth daily. (Patient not taking: Reported on 01/11/2017) 90 tablet 3  . atorvastatin (LIPITOR) 40 MG tablet Take 1 tablet (40 mg total) by mouth daily at 6 PM. (Patient not taking: Reported on 01/11/2017) 90 tablet 0  . glipiZIDE (GLUCOTROL) 5 MG tablet Take 1 tablet (5 mg total) by mouth daily before breakfast. (Patient not taking: Reported on 01/11/2017) 90 tablet 1  . Linagliptin-Metformin HCl 2.03-999 MG TABS Take 1 tablet by mouth 2 (two) times daily. With food (Patient not taking: Reported on 01/11/2017) 180 tablet 1  . lisinopril (PRINIVIL,ZESTRIL) 2.5 MG tablet Take 1 tablet (2.5 mg total) by mouth daily. (Patient not taking: Reported on 01/11/2017) 90 tablet 0  . naproxen (NAPROSYN) 500 MG tablet Take 1 tablet (500 mg total) by mouth 2 (two) times daily with a meal. (Patient not taking: Reported on 01/11/2017) 30 tablet 0  Home: Home Living Family/patient expects to be discharged to:: Private residence Living Arrangements: Other relatives  (Sister) Available Help at Discharge: Family, Available 24 hours/day Type of Home: House Home Access: Level entry Home Layout: Two level, Able to live on main level with bedroom/bathroom, Other (Comment) (Shower upstairs) Alternate Level Stairs-Number of Steps: 15 Alternate Level Stairs-Rails: Left Bathroom Shower/Tub: Tub/shower unit Home Equipment: None Additional Comments: Pt only Spanish-speaking. Sister reports she plans to have pt live with her upon d/c, as she will be available for 24/7 assist.    Functional History: Prior Function Level of Independence: Independent Comments: According to sister, pt lives with roommate, works as Development worker, [employer], and drives.  Functional Status:  Mobility: Bed Mobility Overal bed mobility: Needs Assistance Bed Mobility: Supine to Sit, Sit to Supine Supine to sit: Total assist Sit to supine: Total assist        ADL:    Cognition: Cognition Overall Cognitive Status: Impaired/Different from baseline Arousal/Alertness: Lethargic Orientation Level: Other (comment) (mute) Attention: Focused Focused Attention: Impaired Focused Attention Impairment: Verbal basic, Functional basic Problem Solving: Impaired Problem Solving Impairment: Functional basic Safety/Judgment: Impaired Rancho Los Amigos Scales of Cognitive Functioning: Generalized response (some emerging III) Cognition Arousal/Alertness: Lethargic Behavior During Therapy: Flat affect Overall Cognitive Status: Impaired/Different from baseline Area of Impairment: Attention, Following commands, Awareness, Problem solving, Rancho level, Orientation Orientation Level: Disoriented to Current Attention Level: Focused Following Commands: Follows one step commands inconsistently Awareness: Intellectual Problem Solving: Slow processing, Decreased initiation, Requires verbal cues, Requires tactile cues General Comments: Decreased ability to participate with therapy today, requiring max  multimodal cues to open eyes; inconsistently follows one-step commands to open eyes and look at therapist. Able to visually track therapist to L-side and midline; intermittent tracking to R-side. Nonpurposeful response with fingers to painful stimulus in BUE; no response in BLE.   Physical Exam: Blood pressure 128/77, pulse 85, temperature 98.2 F (36.8 C), temperature source Axillary, resp. rate 19, height '5\' 1"'$  (1.549 m), weight 63 kg (138 lb 14.2 oz), SpO2 97 %. Physical Exam  Vitals reviewed. HENT:  Craniotomy site clean and dry. Nasogastric tube in place  Eyes:  Pupils reactive to light  Neck: Normal range of motion. Neck supple. No thyromegaly present.  Cardiovascular: Normal rate and regular rhythm.   Respiratory:  Limited inspiratory effort but clear to auscultation  GI: Soft. Bowel sounds are normal. He exhibits no distension.  Neurological: He is alert.  Patient is restless alert. Bilateral mittens in place. Moving all extremities. He did have some spontaneous speech primarily speaking [place]. He was not able to say his name but was able to say thank you. Followed simple 1 step commands less than 10%. Difficulty initiating any purposeful movement or speech. Family states he understands some basic english  Psychiatric:  Restless and distracted   Skin. Warm and dry  Results for orders placed or performed during the hospital encounter of 01/10/17 (from the past 48 hour(s))  Glucose, capillary     Status: Abnormal   Collection Time: 01/19/17  3:52 PM  Result Value Ref Range   Glucose-Capillary 106 (H) 65 - 99 mg/dL  Glucose, capillary     Status: Abnormal   Collection Time: 01/19/17  8:05 PM  Result Value Ref Range   Glucose-Capillary 103 (H) 65 - 99 mg/dL  Glucose, capillary     Status: Abnormal   Collection Time: 01/19/17 11:28 PM  Result Value Ref Range   Glucose-Capillary 151 (H) 65 - 99 mg/dL  Glucose, capillary  Status: Abnormal   Collection Time: 01/20/17  4:36 AM   Result Value Ref Range   Glucose-Capillary 167 (H) 65 - 99 mg/dL  Basic metabolic panel     Status: Abnormal   Collection Time: 01/20/17  6:27 AM  Result Value Ref Range   Sodium 132 (L) 135 - 145 mmol/L   Potassium 3.9 3.5 - 5.1 mmol/L   Chloride 96 (L) 101 - 111 mmol/L   CO2 27 22 - 32 mmol/L   Glucose, Bld 199 (H) 65 - 99 mg/dL   BUN 16 6 - 20 mg/dL   Creatinine, Ser 0.52 (L) 0.61 - 1.24 mg/dL   Calcium 8.2 (L) 8.9 - 10.3 mg/dL   GFR calc non Af Amer >60 >60 mL/min   GFR calc Af Amer >60 >60 mL/min    Comment: (NOTE) The eGFR has been calculated using the CKD EPI equation. This calculation has not been validated in all clinical situations. eGFR's persistently <60 mL/min signify possible Chronic Kidney Disease.    Anion gap 9 5 - 15  Glucose, capillary     Status: Abnormal   Collection Time: 01/20/17  7:56 AM  Result Value Ref Range   Glucose-Capillary 190 (H) 65 - 99 mg/dL   Comment 1 Notify RN    Comment 2 Document in Chart   Glucose, capillary     Status: Abnormal   Collection Time: 01/20/17 11:46 AM  Result Value Ref Range   Glucose-Capillary 225 (H) 65 - 99 mg/dL   Comment 1 Notify RN    Comment 2 Document in Chart   Glucose, capillary     Status: Abnormal   Collection Time: 01/20/17  3:22 PM  Result Value Ref Range   Glucose-Capillary 192 (H) 65 - 99 mg/dL  Glucose, capillary     Status: Abnormal   Collection Time: 01/20/17  7:52 PM  Result Value Ref Range   Glucose-Capillary 171 (H) 65 - 99 mg/dL  Glucose, capillary     Status: Abnormal   Collection Time: 01/20/17 11:11 PM  Result Value Ref Range   Glucose-Capillary 194 (H) 65 - 99 mg/dL  Basic metabolic panel     Status: Abnormal   Collection Time: 01/21/17  5:00 AM  Result Value Ref Range   Sodium 138 135 - 145 mmol/L   Potassium 3.8 3.5 - 5.1 mmol/L   Chloride 103 101 - 111 mmol/L   CO2 26 22 - 32 mmol/L   Glucose, Bld 157 (H) 65 - 99 mg/dL   BUN 17 6 - 20 mg/dL   Creatinine, Ser 0.43 (L) 0.61 -  1.24 mg/dL   Calcium 7.8 (L) 8.9 - 10.3 mg/dL   GFR calc non Af Amer >60 >60 mL/min   GFR calc Af Amer >60 >60 mL/min    Comment: (NOTE) The eGFR has been calculated using the CKD EPI equation. This calculation has not been validated in all clinical situations. eGFR's persistently <60 mL/min signify possible Chronic Kidney Disease.    Anion gap 9 5 - 15  Glucose, capillary     Status: Abnormal   Collection Time: 01/21/17  5:12 AM  Result Value Ref Range   Glucose-Capillary 153 (H) 65 - 99 mg/dL  Glucose, capillary     Status: Abnormal   Collection Time: 01/21/17  7:58 AM  Result Value Ref Range   Glucose-Capillary 139 (H) 65 - 99 mg/dL   Comment 1 Notify RN    Comment 2 Document in Chart   Glucose, capillary  Status: Abnormal   Collection Time: 01/21/17 11:33 AM  Result Value Ref Range   Glucose-Capillary 119 (H) 65 - 99 mg/dL   Dg Abd Portable 1v  Result Date: 01/21/2017 CLINICAL DATA:  Nasogastric tube placement EXAM: PORTABLE ABDOMEN - 1 VIEW COMPARISON:  01/19/2017 FINDINGS: Nasogastric tube with the tip projecting over the duodenojejunal flexure. Persistent gaseous distention of small bowel and colon. There is no evidence of pneumoperitoneum, portal venous gas or pneumatosis. There are no pathologic calcifications along the expected course of the ureters. The osseous structures are unremarkable. IMPRESSION: Nasogastric tube with the tip projecting over the duodenojejunal flexure. Electronically Signed   By: Kathreen Devoid   On: 01/21/2017 11:22   Dg Abd Portable 1v  Result Date: 01/19/2017 CLINICAL DATA:  Check for feeding catheter placement EXAM: PORTABLE ABDOMEN - 1 VIEW COMPARISON:  01/17/2017 FINDINGS: Previously seen nasogastric catheter has been removed and a feeding catheter has been placed in lies within the junction of the third and fourth portions of the duodenum just short of the ligamenta trans. Scattered large and small bowel gas is noted. No obstructive changes are  seen. No bony abnormality is noted. Rounded density is noted over the right mid abdomen likely extrinsic to the patient. Clinical correlation is recommended. IMPRESSION: Feeding catheter within the distal duodenum as described. Vague rounded density overlying the right mid abdomen laterally likely related to extrinsic artifact. Clinical correlation is recommended. Electronically Signed   By: Inez Catalina M.D.   On: 01/19/2017 16:11       Medical Problem List and Plan: 1.  Decreased functional mobility with cognitive deficits secondary to right IPH/SDH/temporal bone fracture after fall S/P right frontal temporoparietal craniectomy 01/14/2017 with placement of bone flap in the abdomen  -admit to inpatient rehab 2.  DVT Prophylaxis/Anticoagulation: Subcutaneous heparin. Monitor platelet counts and any signs of bleeding. Check vascular study 3. Pain Management: Hycet 4. Mood: Ativan as needed 5. Neuropsych: This patient is not capable of making decisions on his own behalf. 6. Skin/Wound Care: Routine skin checks 7. Fluids/Electrolytes/Nutrition: Routine I&O with follow-up chemistries 8.Seizure prophylaxis. Keppra 750 mg twice daily 9.Pneumonia. Five-day course of Ancef to be completed 01/22/2017 10.Dysphagia. CorTrak nasogastric tube for a traditional support.Follow-up speech therapy for dysphagia 11.Urinary retention/Escherichia coli UTI. Continue Urecholine. Complete course of Cipro. Check PVR 3 12.Diabetes mellitus with peripheral neuropathy. Hemoglobin A1c 10.4. Lantus insulin 15 units twice a day. Patient on Glucophage 500 mg twice a day and Glucotrol 5 mg daily prior to admission. Resume his needed 13.History of hypertension. Patient on lisinopril 2.5 mg daily prior to admission. Resume as needed 14. Constipation. Laxative assistance  Post Admission Physician Evaluation: 1. Functional deficits secondary  to right SDH,IPH with temporal bone fracture after fall. 2. Patient is admitted to  receive collaborative, interdisciplinary care between the physiatrist, rehab nursing staff, and therapy team. 3. Patient's level of medical complexity and substantial therapy needs in context of that medical necessity cannot be provided at a lesser intensity of care such as a SNF. 4. Patient has experienced substantial functional loss from his/her baseline which was documented above under the "Functional History" and "Functional Status" headings.  Judging by the patient's diagnosis, physical exam, and functional history, the patient has potential for functional progress which will result in measurable gains while on inpatient rehab.  These gains will be of substantial and practical use upon discharge  in facilitating mobility and self-care at the household level. 5. Physiatrist will provide 24 hour management of medical  needs as well as oversight of the therapy plan/treatment and provide guidance as appropriate regarding the interaction of the two. 6. The Preadmission Screening has been reviewed and patient status is unchanged unless otherwise stated above. 7. 24 hour rehab nursing will assist with bladder management, bowel management, safety, skin/wound care, disease management, medication administration, pain management and patient education  and help integrate therapy concepts, techniques,education, etc. 8. PT will assess and treat for/with: Lower extremity strength, range of motion, stamina, balance, functional mobility, safety, adaptive techniques and equipment, NMR, cognitive perceptual awareness, family education, initiation.   Goals are: supervision to min assist. 9. OT will assess and treat for/with: ADL's, functional mobility, safety, upper extremity strength, adaptive techniques and equipment, NMR, cognitive perceptual awareness, initiation, family ed.   Goals are: supervision to min assist. Therapy may not yet proceed with showering this patient. 10. SLP will assess and treat for/with: cognition,  communication, swallowing, family education.  Goals are: min to mod assist. 11. Case Management and Social Worker will assess and treat for psychological issues and discharge planning. 12. Team conference will be held weekly to assess progress toward goals and to determine barriers to discharge. 13. Patient will receive at least 3 hours of therapy per day at least 5 days per week. 14. ELOS: 25-30 days       15. Prognosis:  good     Meredith Staggers, MD, [facility] 02/01/2017  Cathlyn Parsons., PA-C 01/21/2017

## 2017-01-21 NOTE — Consult Note (Signed)
Physical Medicine and Rehabilitation Consult Reason for Consult:TBI/right frontal intraparenchymal hemorrhage/SDH/SAH Referring Physician: Trauma services   HPI: [name] is a [name] with history of diabetes mellitus. Per chart review patient lives with sister was independent prior to admission. Presented 01/10/2017 fter being found down from a supposed fall. CT of the head and imaging revealed intracranial hemorrhage with a 4.5 cm right frontal intraparenchymal hemorrhage, 6 mm right subdural hematoma and other scattered areas of intraparenchymal and subarachnoid hemorrhage with right to left midline shift. Nondisplaced vertical fracture through the left temporal bone.CT cervical spine negative.CT abdomen and pelvis with no evidence of acute injury.UDS negative.neurosurgery Dr. Marikay Alar advised conservative care with ICP monitor.follow-up ENT Dr. Jenne Pane for temporal bone fracture no surgical intervention required.follow-up CT imaging showed increased right to left shift greater than 1 cm. Basal cisterns slightly effaced.Patient was steady rise in his ICP and not responsive to heavy sedation. .Underwent right frontal temporal parietal craniectomy for hematoma evacuation 01/14/2017 per Dr. Mikal Plane with placement of bone flap in the abdomen. Keppra for seizure prophylaxis.Patient was extubated 01/19/2017. Later placed on subcutaneous heparin for DVT prophylaxis. Bouts of urinary retention with Urecholine added. Physical therapy evaluation completed with recommendations of physical medicine rehabilitation consult.   Review of Systems  Unable to perform ROS: Acuity of condition   Past Medical History:  Diagnosis Date  . Diabetes mellitus without complication [facility])    Past Surgical History:  Procedure Laterality Date  . CRANIOTOMY Right 01/13/2017   Procedure: 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, Placement  of bone flap in abdomen;  Surgeon: Coletta Memos, MD;  Location: [facility] OR;  Service: Neurosurgery;  Laterality: Right;   Family History  Problem Relation Age of Onset  . Diabetes Mother   . Diabetes Father    Social History:  reports that he has never smoked. He has never used smokeless tobacco. He reports that he drinks alcohol. He reports that he does not use drugs. Allergies: No Known Allergies Medications Prior to Admission  Medication Sig Dispense Refill  . metFORMIN (GLUCOPHAGE) 500 MG tablet Take 500 mg by mouth 2 (two) times daily with a meal.    . aspirin EC 81 MG tablet Take 1 tablet (81 mg total) by mouth daily. (Patient not taking: Reported on 01/11/2017) 90 tablet 3  . atorvastatin (LIPITOR) 40 MG tablet Take 1 tablet (40 mg total) by mouth daily at 6 PM. (Patient not taking: Reported on 01/11/2017) 90 tablet 0  . glipiZIDE (GLUCOTROL) 5 MG tablet Take 1 tablet (5 mg total) by mouth daily before breakfast. (Patient not taking: Reported on 01/11/2017) 90 tablet 1  . Linagliptin-Metformin HCl 2.03-999 MG TABS Take 1 tablet by mouth 2 (two) times daily. With food (Patient not taking: Reported on 01/11/2017) 180 tablet 1  . lisinopril (PRINIVIL,ZESTRIL) 2.5 MG tablet Take 1 tablet (2.5 mg total) by mouth daily. (Patient not taking: Reported on 01/11/2017) 90 tablet 0  . naproxen (NAPROSYN) 500 MG tablet Take 1 tablet (500 mg total) by mouth 2 (two) times daily with a meal. (Patient not taking: Reported on 01/11/2017) 30 tablet 0    Home: Home Living Family/patient expects to be discharged to:: Private residence Living Arrangements: Other relatives (Sister) Available Help at Discharge: Family, Available 24 hours/day Type of Home: House Home Access: Level entry Home Layout: Two level, Able to live on main level with bedroom/bathroom, Other (Comment) (Shower upstairs) Alternate Level Stairs-Number of Steps:  15 Alternate Level Stairs-Rails: Left Bathroom Shower/Tub: Tub/shower unit Home  Equipment: None Additional Comments: Pt only Spanish-speaking. Sister reports she plans to have pt live with her upon d/c, as she will be available for 24/7 assist.   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 Functional Status:  Mobility: Bed Mobility Overal bed mobility: Needs Assistance Bed Mobility: Supine to Sit, Sit to Supine Supine to sit: Total assist Sit to supine: Total assist        ADL:    Cognition: Cognition Overall Cognitive Status: Impaired/Different from baseline Arousal/Alertness: Lethargic Orientation Level: Other (comment) ([place]) Attention: Focused Focused Attention: Impaired Focused Attention Impairment: Verbal basic, Functional basic Problem Solving: Impaired Problem Solving Impairment: Functional basic Safety/Judgment: Impaired [place] [address] Scales of Cognitive Functioning: Generalized response (some emerging III) Cognition Arousal/Alertness: Lethargic Behavior During Therapy: Flat affect Overall Cognitive Status: Impaired/Different from baseline Area of Impairment: Attention, Following commands, Awareness, Problem solving, [place] level, Orientation Orientation Level: Disoriented to Current Attention Level: Focused Following Commands: Follows one step commands inconsistently Awareness: Intellectual Problem Solving: Slow processing, Decreased initiation, Requires verbal cues, Requires tactile cues General Comments: Decreased ability to participate with therapy today, requiring max multimodal cues to open eyes; inconsistently follows one-step commands to open eyes and look at therapist. Able to visually track therapist to L-side and midline; intermittent tracking to R-side. Nonpurposeful response with fingers to painful stimulus in BUE; no response in BLE.   Blood pressure 137/70, pulse 91, temperature 97.7 F (36.5 C), temperature  source Axillary, resp. rate 17, height 5\' 1"  (1.549 m), weight 63 kg (138 lb 14.2 oz), SpO2 96 %. Physical Exam  Vitals reviewed. HENT:  Craniotomy site clean and dry  Eyes:  Pupils sluggish to light  Neck: Normal range of motion. Neck supple. No thyromegaly present.  Cardiovascular: Normal rate and regular rhythm.   Respiratory: Effort normal and breath sounds normal. No respiratory distress.  GI: Soft. Bowel sounds are normal. He exhibits no distension.  Neurological:  Patient very lethargic with fall back asleep during exam. Made minimal eye contact with examiner. He did not follow commands and remained nonverbal. Moves all 4 limbs but inconsistent due to arousal. Sensed more pain actually in LUE today.     Results for orders placed or performed during the hospital encounter of 01/10/17 (from the past 24 hour(s))  Glucose, capillary     Status: Abnormal   Collection Time: 01/20/17 11:46 AM  Result Value Ref Range   Glucose-Capillary 225 (H) 65 - 99 mg/dL   Comment 1 Notify RN    Comment 2 Document in Chart   Glucose, capillary     Status: Abnormal   Collection Time: 01/20/17  3:22 PM  Result Value Ref Range   Glucose-Capillary 192 (H) 65 - 99 mg/dL  Glucose, capillary     Status: Abnormal   Collection Time: 01/20/17  7:52 PM  Result Value Ref Range   Glucose-Capillary 171 (H) 65 - 99 mg/dL  Glucose, capillary     Status: Abnormal   Collection Time: 01/20/17 11:11 PM  Result Value Ref Range   Glucose-Capillary 194 (H) 65 - 99 mg/dL  Basic metabolic panel     Status: Abnormal   Collection Time: 01/21/17  5:00 AM  Result Value Ref Range   Sodium 138 135 - 145 mmol/L   Potassium 3.8 3.5 - 5.1 mmol/L   Chloride 103 101 - 111 mmol/L  CO2 26 22 - 32 mmol/L   Glucose, Bld 157 (H) 65 - 99 mg/dL   BUN 17 6 - 20 mg/dL   Creatinine, Ser 1.61 (L) 0.61 - 1.24 mg/dL   Calcium 7.8 (L) 8.9 - 10.3 mg/dL   GFR calc non Af Amer >60 >60 mL/min   GFR calc Af Amer >60 >60 mL/min   Anion  gap 9 5 - 15  Glucose, capillary     Status: Abnormal   Collection Time: 01/21/17  5:12 AM  Result Value Ref Range   Glucose-Capillary 153 (H) 65 - 99 mg/dL  Glucose, capillary     Status: Abnormal   Collection Time: 01/21/17  7:58 AM  Result Value Ref Range   Glucose-Capillary 139 (H) 65 - 99 mg/dL   Comment 1 Notify RN    Comment 2 Document in Chart    Dg Abd Portable 1v  Result Date: 01/19/2017 CLINICAL DATA:  Check for feeding catheter placement EXAM: PORTABLE ABDOMEN - 1 VIEW COMPARISON:  01/17/2017 FINDINGS: Previously seen nasogastric catheter has been removed and a feeding catheter has been placed in lies within the junction of the third and fourth portions of the duodenum just short of the ligamenta trans. Scattered large and small bowel gas is noted. No obstructive changes are seen. No bony abnormality is noted. Rounded density is noted over the right mid abdomen likely extrinsic to the patient. Clinical correlation is recommended. IMPRESSION: Feeding catheter within the distal duodenum as described. Vague rounded density overlying the right mid abdomen laterally likely related to extrinsic artifact. Clinical correlation is recommended. Electronically Signed   By: Alcide Clever M.D.   On: 01/19/2017 16:11    Assessment/Plan: Diagnosis: Right IPH/SDH after fall 1. Does the need for close, 24 hr/day medical supervision in concert with the patient's rehab needs make it unreasonable for this patient to be served in a less intensive setting? Yes 2. Co-Morbidities requiring supervision/potential complications: dm2, post-tbi sequelae 3. Due to bladder management, bowel management, safety, skin/wound care, disease management, medication administration, pain management and patient education, does the patient require 24 hr/day rehab nursing? Yes 4. Does the patient require coordinated care of a physician, rehab nurse, PT (1-2 hrs/day, 5 days/week), OT (1-2 hrs/day, 5 days/week) and SLP (1-2  hrs/day, 5 days/week) to address physical and functional deficits in the context of the above medical diagnosis(es)? Yes Addressing deficits in the following areas: balance, endurance, locomotion, strength, transferring, bowel/bladder control, bathing, dressing, feeding, grooming, toileting, cognition, speech, language, swallowing and psychosocial support 5. Can the patient actively participate in an intensive therapy program of at least 3 hrs of therapy per day at least 5 days per week? Yes and Potentially 6. The potential for patient to make measurable gains while on inpatient rehab is good 7. Anticipated functional outcomes upon discharge from inpatient rehab are modified independent and supervision  with PT, modified independent, supervision and min assist with OT, supervision and min assist with SLP. 8. Estimated rehab length of stay to reach the above functional goals is: potentially 18-25 days 9. Does the patient have adequate social supports and living environment to accommodate these discharge functional goals? Yes and Potentially 10. Anticipated D/C setting: Home 11. Anticipated post D/C treatments: HH therapy and Outpatient therapy 12. Overall Rehab/Functional Prognosis: excellent  RECOMMENDATIONS: This patient's condition is appropriate for continued rehabilitative care in the following setting: CIR Patient has agreed to participate in recommended program. N/A Note that insurance prior authorization may be required for reimbursement for  recommended care.  Comment: Rehab Admissions Coordinator to follow up.  Thanks,  Ranelle OysterZachary T. Cope Marte, MD, [place] DomFAAPMR    Charlton AmorANGIULLI,DANIEL J., PA-C 01/21/2017

## 2017-01-21 NOTE — Evaluation (Signed)
Occupational Therapy Evaluation Patient Details Name: [name] MRN: [id number] DOB: Apr 10, 1966 Today's Date: 01/21/2017    History of Present Illness pt was found down on 3/11 after a fall on stairs the evening before.  Pt sustained R Frontal Intraparenchymal Hemorrhage, R SDH, and SAH with midline shift.  Pt also sustained non-displaced L Temporal Bone fx and L Mastoid fx.  Follow-up CT on 3/14 showed additional L Temporal Hemorrhagic Contusion.  Pt not protecting airway and then intubated once in ICU on 3/11.  On 3/12 pt underwent burr hole with placement of ICP monitor.  Pt is now s/p Frontotemporal Parietal Craniotomy with bone flap removed and placed in abdomen on 3/14.  pt with hx of DM.     Clinical Impression   Pt admitted with above. He demonstrates the below listed deficits and will benefit from continued OT to maximize safety and independence with BADLs.  Pt will follow one step commands inconsistently, and will spontaneously pick up washcloth to wipe mouth.  He demonstrates behaviors consistent with [place] level III.   Sister present throughout eval.  Pt with no spontaneous movement bil. UEs, and does not withdraw to pain bil. LEs despite multiple attempts to elicit responses from LEs       Follow Up Recommendations  CIR;Supervision/Assistance - 24 hour    Equipment Recommendations  None recommended by OT    Recommendations for Other Services Rehab consult     Precautions / Restrictions Precautions Precautions: Fall;Other (comment) Precaution Comments: Bone flap removed on R. Restrictions Weight Bearing Restrictions: No      Mobility Bed Mobility Overal bed mobility: Needs Assistance Bed Mobility: Supine to Sit;Sit to Supine     Supine to sit: Total assist;+2 for physical assistance Sit to supine: Total assist;+2 for physical assistance   General bed mobility comments: pt does not participate with bed mobility.    Transfers                  General transfer comment: unable     Balance Overall balance assessment: Needs assistance Sitting-balance support: Single extremity supported;Bilateral upper extremity supported;No upper extremity supported;Feet supported Sitting balance-Leahy Scale: Poor Sitting balance - Comments: pt at times pushing and pulling with L UE on the bed rail, but not using UE functionally for maintaining balance.                                      ADL Overall ADL's : Needs assistance/impaired Eating/Feeding: NPO   Grooming: Wash/dry hands;Wash/dry face;Maximal assistance;Sitting Grooming Details (indicate cue type and reason): Pt requires total A for EOB sitting.  He will spontaneously wipe mouth and wipe hands, but with decreased thoroughness  Upper Body Bathing: Total assistance;Bed level   Lower Body Bathing: Total assistance;Bed level   Upper Body Dressing : Total assistance;Bed level   Lower Body Dressing: Total assistance;Bed level   Toilet Transfer: Total assistance   Toileting- Clothing Manipulation and Hygiene: Total assistance;Bed level       Functional mobility during ADLs: Total assistance;+2 for physical assistance       Vision   Additional Comments: Pt unable to participate in formal visual assessment.  Pt with Lt gaze preference, but will look spontaneously to Rt.  Lolling and roaving eye movements noted      Perception Perception Perception Tested?: Yes Perception Deficits: Inattention/neglect Inattention/Neglect: Does not attend to right visual field Spatial deficits: Pt  wtih Lt gaze preference    Praxis Praxis Praxis tested?: Deficits Deficits: Initiation    Pertinent Vitals/Pain Pain Assessment: Faces Faces Pain Scale: Hurts a little bit Pain Location: withrdawals from pain BUE but NOT BLE Pain Descriptors / Indicators: Restless Pain Intervention(s): Monitored during session     Hand Dominance Right   Extremity/Trunk Assessment Upper  Extremity Assessment Upper Extremity Assessment: RUE deficits/detail;LUE deficits/detail RUE Deficits / Details: Pt spontaneously moving elbow distally  LUE Deficits / Details: Pt moving Lt UE purposefully    Lower Extremity Assessment Lower Extremity Assessment: Defer to PT evaluation   Cervical / Trunk Assessment Cervical / Trunk Assessment: Other exceptions Cervical / Trunk Exceptions: Pt with flexed head/neck and trunk.  He does not initiate trunk extension    Communication Communication Communication: Prefers language other than English (sister reports he speaks good [place], but she also interpre)   Cognition Arousal/Alertness: Lethargic Behavior During Therapy: Flat affect Overall Cognitive Status: Impaired/Different from baseline Area of Impairment: Attention;Following commands;JFK Recovery Scale;[place] level   Current Attention Level: Focused   Following Commands: Follows one step commands inconsistently     Problem Solving: Slow processing;Decreased initiation General Comments: pt lethargic and at times needs stimulation for eye opening and arousal.  pt tends to keep gaze to L side, but will come past midline to R side.  pt functionally uses wash cloth when presented with it and does follow some directions to give back wash cloth and to apply lotion to hands.     General Comments       Exercises       Shoulder Instructions      Home Living Family/patient expects to be discharged to:: Private residence Living Arrangements: Other relatives Available Help at Discharge: Family;Available 24 hours/day Type of Home: House Home Access: Level entry     Home Layout: Two level;Able to live on main level with bedroom/bathroom;Other (Comment) Alternate Level Stairs-Number of Steps: 15 Alternate Level Stairs-Rails: Left Bathroom Shower/Tub: Tub/shower unit         Home Equipment: None   Additional Comments: sister reports she will be primary caregiver at discharge        Prior Functioning/Environment Level of Independence: Independent        Comments: According to sister, 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        OT Problem List: Decreased strength;Decreased range of motion;Decreased activity tolerance;Impaired balance (sitting and/or standing);Impaired vision/perception;Decreased coordination;Decreased cognition;Decreased safety awareness;Decreased knowledge of use of DME or AE;Decreased knowledge of precautions;Impaired UE functional use      OT Treatment/Interventions: Self-care/ADL training;Neuromuscular education;DME and/or AE instruction;Manual therapy;Therapeutic activities;Cognitive remediation/compensation;Visual/perceptual remediation/compensation;Patient/family education;Balance training    OT Goals(Current goals can be found in the care plan section) Acute Rehab OT Goals Patient Stated Goal: Per sister for pt to be able to return to her home. OT Goal Formulation: With family Time For Goal Achievement: 02/04/17 Potential to Achieve Goals: Good ADL Goals Pt Will Perform Grooming: with mod assist;sitting Pt Will Perform Upper Body Bathing: with max assist;sitting Pt Will Transfer to Toilet: with max assist;stand pivot transfer;bedside commode Additional ADL Goal #1: Pt will sit EOB with mod A in prep for ADLs  Additional ADL Goal #2: Pt will consistently follow one step commands  Additional ADL Goal #3: Pt will locate items on Rt with mod cues   OT Frequency: Min 3X/week   Barriers to D/C:            Co-evaluation PT/OT/SLP Co-Evaluation/Treatment: Yes  Reason for Co-Treatment: Complexity of the patient's impairments (multi-system involvement);Necessary to address cognition/behavior during functional activity PT goals addressed during session: Mobility/safety with mobility;Balance OT goals addressed during session: ADL's and self-care SLP goals addressed during session: Cognition    End of Session Nurse  Communication: Mobility status  Activity Tolerance: Patient limited by lethargy;Other (comment) (cognition ) Patient left: in bed;with call bell/phone within reach;with bed alarm set;with family/visitor present;with restraints reapplied  OT Visit Diagnosis: Cognitive communication deficit (R41.841);Muscle weakness (generalized) (M62.81)                ADL either performed or assessed with clinical judgement  Time: 1610-9604 OT Time Calculation (min): 49 min Charges:  OT General Charges $OT Visit: 1 Procedure OT Evaluation $OT Eval High Complexity: 1 Procedure G-Codes:     Jeani Hawking, OTR/L [phone]    Jeani Hawking M 01/21/2017, 4:17 PM

## 2017-01-21 NOTE — Progress Notes (Signed)
TBI TEAM EVALUATION  HPI: pt was found down on 3/11 after a fall on stairs the evening before.  Pt sustained R Frontal Intraparenchymal Hemorrhage, R SDH, and SAH with midline shift.  Pt also sustained non-displaced L Temporal Bone fx and L Mastoid fx.  Follow-up CT on 3/14 showed additional L Temporal Hemorrhagic Contusion.  Pt not protecting airway and then intubated once in ICU on 3/11.  On 3/12 pt underwent burr hole with placement of ICP monitor.  Pt is now s/p Frontotemporal Parietal Craniotomy with bone flap removed and placed in abdomen on 3/14.   Occupation: Unknown Primary Language: Spanish  Loss of conscious:  Yes     If yes, length of time? Unknown, as pt was found down.    Intubation:   Yes                   If yes, location/ dates? January 10, 2017 in ICU.  MRI complete: No Date:         Results: Pertinent F/u MRI: n/a Date: Results:  Initial CT:Yes Date:January 10, 2017 Results:Intracranial hemorrhage with 4.5 cm right frontal intraparenchymal hemorrhage, 6 mm right subdural hematoma and other scattered areas of intraparenchymal and subarachnoid hemorrhage as described. 7 mm right to left midline shift.  Nondisplaced vertical fracture through the left temporal bone. Consider high-resolution CT as clinically indicated.  Pertinent F/u CT:yes Date:January 13, 2017 Results:Evolving large RIGHT frontotemporal hemorrhagic contusions. Increasing 11 mm RIGHT to LEFT midline shift with new LEFT lateral ventricle entrapment.  Worsening global edema.  Small LEFT temporal hemorrhagic contusion.  New RIGHT frontal ICP monitor.  Stable small RIGHT subdural hematoma and scattered subarachnoid hemorrhage.  Pertinent Chest xray: no Date: Results:  Initial GCS score: January 10, 2017, 8  Sedation required:No , Currently sedated:No,  Sedation lifted? :         Pupil Appearance: abnormal - R constricted and not responding to light.  L slow to respond to light, direct  pupillary reaction to light abnormal Response to Sensory Testing: abnormal - Bil LEs with absent pain sensation.    Primitive reflexes present: No    ("x" if present)  grasp   snout   bite   Tongue thrust   sucking   rooting   Flexor withdrawal   Extensor thrust   palmonmental   babinski   Asymmetrical tonic neck reflex   glabellar    Additional Skilled Neurobehavioral abnormalities: No   ("x" if present)  Decerebrate   Decorticate   Posturing    Precautions: ICP Pressure: n/a

## 2017-01-21 NOTE — Progress Notes (Signed)
8 Days Post-Op  Subjective: Pulled out CorTrak overnight  Objective: Vital signs in last 24 hours: Temp:  [97.7 F (36.5 C)-99 F (37.2 C)] 97.7 F (36.5 C) (03/22 0000) Pulse Rate:  [82-105] 91 (03/22 0600) Resp:  [12-23] 17 (03/22 0600) BP: (112-147)/(58-98) 137/70 (03/22 0600) SpO2:  [96 %-100 %] 96 % (03/22 0600) Weight:  [63 kg (138 lb 14.2 oz)] 63 kg (138 lb 14.2 oz) (03/22 0500) Last BM Date: 01/20/17  Intake/Output from previous day: 03/21 0701 - 03/22 0700 In: 2403.8 [I.V.:981.3; NG/GT:1265; IV Piggyback:157.5] Out: 2600 [Urine:2600] Intake/Output this shift: No intake/output data recorded.  General appearance: no distress Resp: clear after cough Cardio: regular rate and rhythm GI: soft, non-tender; bowel sounds normal; no masses,  no organomegaly  Neuro: follows commands in Spanish  Lab Results: CBC   Recent Labs  01/19/17 0430  WBC 8.1  HGB 8.1*  HCT 23.3*  PLT 325   BMET  Recent Labs  01/20/17 0627 01/21/17 0500  NA 132* 138  K 3.9 3.8  CL 96* 103  CO2 27 26  GLUCOSE 199* 157*  BUN 16 17  CREATININE 0.52* 0.43*  CALCIUM 8.2* 7.8*   PT/INR No results for input(s): LABPROT, INR in the last 72 hours. ABG No results for input(s): PHART, HCO3 in the last 72 hours.  Invalid input(s): PCO2, PO2  Studies/Results: Dg Abd Portable 1v  Result Date: 01/19/2017 CLINICAL DATA:  Check for feeding catheter placement EXAM: PORTABLE ABDOMEN - 1 VIEW COMPARISON:  01/17/2017 FINDINGS: Previously seen nasogastric catheter has been removed and a feeding catheter has been placed in lies within the junction of the third and fourth portions of the duodenum just short of the ligamenta trans. Scattered large and small bowel gas is noted. No obstructive changes are seen. No bony abnormality is noted. Rounded density is noted over the right mid abdomen likely extrinsic to the patient. Clinical correlation is recommended. IMPRESSION: Feeding catheter within the distal  duodenum as described. Vague rounded density overlying the right mid abdomen laterally likely related to extrinsic artifact. Clinical correlation is recommended. Electronically Signed   By: Alcide Clever M.D.   On: 01/19/2017 16:11    Anti-infectives: Anti-infectives    Start     Dose/Rate Route Frequency Ordered Stop   01/18/17 0845  ceFAZolin (ANCEF) IVPB 1 g/50 mL premix     1 g 100 mL/hr over 30 Minutes Intravenous Every 8 hours 01/18/17 0843 01/23/17 0559   01/13/17 1830  piperacillin-tazobactam (ZOSYN) IVPB 3.375 g  Status:  Discontinued     3.375 g 12.5 mL/hr over 240 Minutes Intravenous Every 8 hours 01/13/17 1016 01/18/17 0843   01/13/17 1030  piperacillin-tazobactam (ZOSYN) IVPB 3.375 g     3.375 g 100 mL/hr over 30 Minutes Intravenous  Once 01/13/17 1016 01/13/17 1125   01/10/17 1600  metroNIDAZOLE (FLAGYL) IVPB 500 mg     500 mg 100 mL/hr over 60 Minutes Intravenous  Once 01/10/17 1518 01/10/17 1629   01/10/17 1045  cefTRIAXone (ROCEPHIN) 1 g in dextrose 5 % 50 mL IVPB     1 g 100 mL/hr over 30 Minutes Intravenous  Once 01/10/17 1035 01/10/17 1248   01/10/17 1045  azithromycin (ZITHROMAX) 500 mg in dextrose 5 % 250 mL IVPB     500 mg 250 mL/hr over 60 Minutes Intravenous  Once 01/10/17 1035 01/10/17 1353   01/10/17 1045  metroNIDAZOLE (FLAGYL) IVPB 500 mg  Status:  Discontinued     500 mg 100  mL/hr over 60 Minutes Intravenous  Once 01/10/17 1035 01/10/17 1517      Assessment/Plan: Fall TBI/L SDH/B ICC/IVH - S/P decompressive craniectomy 3/15 by Dr. Franky Machoabbell, Dr. Yetta BarreJones following, on Keppra. Exam has improved. 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   MRN: [id number]

## 2017-01-21 NOTE — Progress Notes (Signed)
Rehab admissions - I met with patient's sister at the bedside.  Patient asleep on rounds.  Sister would like inpatient rehab and then home with sister and brother-in-law.  Patient has no SS number, no insurance.  Patient speaks limited [place].  I will follow along for medical readiness.  Call me for questions.  #[phone]

## 2017-01-21 NOTE — Progress Notes (Signed)
Physical Therapy Treatment Patient Details Name: Loni BeckwithMiguel Garcia Cardenas MRN: [id number] DOB: [birth year] Today's Date: 01/21/2017    History of Present Illness pt was found down on 3/11 after a fall on stairs the evening before.  Pt sustained R Frontal Intraparenchymal Hemorrhage, R SDH, and SAH with midline shift.  Pt also sustained non-displaced L Temporal Bone fx and L Mastoid fx.  Follow-up CT on 3/14 showed additional L Temporal Hemorrhagic Contusion.  Pt not protecting airway and then intubated once in ICU on 3/11.  On 3/12 pt underwent burr hole with placement of ICP monitor.  Pt is now s/p Frontotemporal Parietal Craniotomy with bone flap removed and placed in abdomen on 3/14.  pt with hx of DM.      PT Comments    Pt today presents as a Rancho III with localized responses.  Pt restless throughout session and purposefully grabbing at lines an tubes.  Pt's L UE tends to roam and will push or pull on bed rail, but not functionally.  Pt is following some directions mostly in Spanish, but one in [place], though not consistent in either language.  Sister present throughout session, assisted with interpreting, provided TBI education booklet, and education on TBI recovery.  OF note: pt with absent sensation in Bil LEs, no response to pain sensation, and no active movement noted in either LE.  Continue to feel pt would benefit from CIR level of therapies to decrease burden of care and A with family education.     Follow Up Recommendations  CIR     Equipment Recommendations  None recommended by PT    Recommendations for Other Services       Precautions / Restrictions Precautions Precautions: Fall;Other (comment) Precaution Comments: Bone flap removed on R. Restrictions Weight Bearing Restrictions: No    Mobility  Bed Mobility Overal bed mobility: Needs Assistance Bed Mobility: Supine to Sit;Sit to Supine     Supine to sit: Total assist;+2 for physical assistance Sit to supine:  Total assist;+2 for physical assistance   General bed mobility comments: pt does not participate with bed mobility.    Transfers                    Ambulation/Gait                 Stairs            Wheelchair Mobility    Modified Rankin (Stroke Patients Only)       Balance Overall balance assessment: Needs assistance Sitting-balance support: Single extremity supported;Bilateral upper extremity supported;No upper extremity supported;Feet supported Sitting balance-Leahy Scale: Poor Sitting balance - Comments: pt at times pushing and pulling with L UE on the bed rail, but not using UE functionally for maintaining balance.                              Cognition Arousal/Alertness: Lethargic Behavior During Therapy: Flat affect Overall Cognitive Status: Impaired/Different from baseline Area of Impairment: Attention;Following commands;JFK Recovery Scale;Rancho level   Current Attention Level: Focused   Following Commands: Follows one step commands inconsistently       General Comments: pt lethargic and at times needs stimulation for eye opening and arousal.  pt tends to keep gaze to L side, but will come past midline to R side.  pt functionally uses wash cloth when presented with it and does follow some directions to give back wash cloth and to  apply lotion to hands.      Exercises      General Comments        Pertinent Vitals/Pain Pain Assessment: Faces Faces Pain Scale: Hurts a little bit Pain Location: withrdawals from pain BUE but NOT BLE Pain Descriptors / Indicators: Restless Pain Intervention(s): Limited activity within patient's tolerance;Monitored during session;Repositioned    Home Living                      Prior Function            PT Goals (current goals can now be found in the care plan section) Acute Rehab PT Goals Patient Stated Goal: Per sister for pt to be able to return to her home. PT Goal  Formulation: Patient unable to participate in goal setting Time For Goal Achievement: 02/03/17 Progress towards PT goals: Progressing toward goals    Frequency    Min 3X/week      PT Plan Frequency needs to be updated    Co-evaluation PT/OT/SLP Co-Evaluation/Treatment: Yes Reason for Co-Treatment: Complexity of the patient's impairments (multi-system involvement);Necessary to address cognition/behavior during functional activity;For patient/therapist safety PT goals addressed during session: Mobility/safety with mobility;Balance   SLP goals addressed during session: Cognition   End of Session Equipment Utilized During Treatment: Oxygen Activity Tolerance: Patient limited by lethargy Patient left: in bed;with call bell/phone within reach;with bed alarm set;with family/visitor present (mitts applied) Nurse Communication: Mobility status PT Visit Diagnosis: Muscle weakness (generalized) (M62.81)     Time: 1610-9604 PT Time Calculation (min) (ACUTE ONLY): 49 min  Charges:  $Therapeutic Activity: 8-22 mins                    G CodesSunny Schlein, [place] [phone] 01/21/2017, 3:58 PM

## 2017-01-22 LAB — GLUCOSE, CAPILLARY
GLUCOSE-CAPILLARY: 166 mg/dL — AB (ref 65–99)
Glucose-Capillary: 145 mg/dL — ABNORMAL HIGH (ref 65–99)
Glucose-Capillary: 146 mg/dL — ABNORMAL HIGH (ref 65–99)
Glucose-Capillary: 154 mg/dL — ABNORMAL HIGH (ref 65–99)
Glucose-Capillary: 165 mg/dL — ABNORMAL HIGH (ref 65–99)
Glucose-Capillary: 220 mg/dL — ABNORMAL HIGH (ref 65–99)

## 2017-01-22 MED ORDER — SODIUM CHLORIDE 0.9% FLUSH
10.0000 mL | INTRAVENOUS | Status: DC | PRN
  Administered 2017-01-22 – 2017-01-31 (×13): 10 mL
  Filled 2017-01-22 (×13): qty 40

## 2017-01-22 NOTE — Progress Notes (Signed)
Stopped by to visit w/ family in rm as pt slept. Wife said they are Catholic and attend [employer] [facility] [employer]. But no religious designation was made on admissions data, so Catholic communion ministers do not know to come.   Nurse in rm who joined us in prayer graciously said she'd add pt's religious preference to pt's record, and put in a consult for Monday asking Catholic communion ministers to come on their next M-W-F regular rounds. Offered spiritual/emotional support and prayer - which wife much appreciated. Chaplain available for f/u.   01/22/17 1400  Clinical Encounter Type  Visited With Patient and family together;Health care provider  Visit Type Initial;Psychological support;Spiritual support;Social support  Referral From Chaplain  Spiritual Encounters  Spiritual Needs Prayer;Ritual;Emotional  Stress Factors  Patient Stress Factors Health changes;Loss of control  Family Stress Factors Family relationships;Health changes;Loss of control   [name], [address]

## 2017-01-22 NOTE — Progress Notes (Signed)
[facility] Progress Note  9 Days Post-Op  Subjective: CorTrak replaced. Resting comfortably. Opening eyes to command.  Objective: Vital signs in last 24 hours: Temp:  [97.5 F (36.4 C)-99.5 F (37.5 C)] 97.7 F (36.5 C) (03/23 0543) Pulse Rate:  [85-109] 109 (03/23 0543) Resp:  [13-20] 20 (03/23 0543) BP: (128-163)/(59-80) 161/67 (03/23 0543) SpO2:  [95 %-100 %] 100 % (03/23 0543) Weight:  [64.8 kg (142 lb 14.4 oz)-65.9 kg (145 lb 4.8 oz)] 65.9 kg (145 lb 4.8 oz) (03/23 0500) Last BM Date: 01/21/17  Intake/Output from previous day: 03/22 0701 - 03/23 0700 In: 373.3 [I.V.:300; NG/GT:73.3] Out: 2375 [Urine:2375] Intake/Output this shift: No intake/output data recorded.  PE: Gen:  Somnolent, no distress Card:  Regular rate and rhythm Pulm:  Mild expiratory rhonchi bilaterally  Abd: Soft, non-tender, palpable skull flap in right hemiabdomen.  Neuro: arousable and follows commands in spanish  Lab Results:  No results for input(s): WBC, HGB, HCT, PLT in the last 72 hours. BMET  Recent Labs  01/20/17 0627 01/21/17 0500  NA 132* 138  K 3.9 3.8  CL 96* 103  CO2 27 26  GLUCOSE 199* 157*  BUN 16 17  CREATININE 0.52* 0.43*  CALCIUM 8.2* 7.8*   PT/INR No results for input(s): LABPROT, INR in the last 72 hours. CMP     Component Value Date/Time   NA 138 01/21/2017 0500   K 3.8 01/21/2017 0500   CL 103 01/21/2017 0500   CO2 26 01/21/2017 0500   GLUCOSE 157 (H) 01/21/2017 0500   BUN 17 01/21/2017 0500   CREATININE 0.43 (L) 01/21/2017 0500   CREATININE 0.71 06/02/2016 1232   CALCIUM 7.8 (L) 01/21/2017 0500   PROT 7.6 01/10/2017 1022   ALBUMIN 4.2 01/10/2017 1022   AST 37 01/10/2017 1022   ALT 24 01/10/2017 1022   ALKPHOS 45 01/10/2017 1022   BILITOT 0.9 01/10/2017 1022   GFRNONAA >60 01/21/2017 0500   GFRAA >60 01/21/2017 0500   Lipase  No results found for: LIPASE     Studies/Results: Dg Abd Portable 1v  Result Date: 01/21/2017 CLINICAL  DATA:  Nasogastric tube placement EXAM: PORTABLE ABDOMEN - 1 VIEW COMPARISON:  01/19/2017 FINDINGS: Nasogastric tube with the tip projecting over the duodenojejunal flexure. Persistent gaseous distention of small bowel and colon. There is no evidence of pneumoperitoneum, portal venous gas or pneumatosis. There are no pathologic calcifications along the expected course of the ureters. The osseous structures are unremarkable. IMPRESSION: Nasogastric tube with the tip projecting over the duodenojejunal flexure. Electronically Signed   By: Elige KoHetal  Patel   On: 01/21/2017 11:22    Anti-infectives: Anti-infectives    Start     Dose/Rate Route Frequency Ordered Stop   01/18/17 0845  ceFAZolin (ANCEF) IVPB 1 g/50 mL premix     1 g 100 mL/hr over 30 Minutes Intravenous Every 8 hours 01/18/17 0843 01/23/17 0559   01/13/17 1830  piperacillin-tazobactam (ZOSYN) IVPB 3.375 g  Status:  Discontinued     3.375 g 12.5 mL/hr over 240 Minutes Intravenous Every 8 hours 01/13/17 1016 01/18/17 0843   01/13/17 1030  piperacillin-tazobactam (ZOSYN) IVPB 3.375 g     3.375 g 100 mL/hr over 30 Minutes Intravenous  Once 01/13/17 1016 01/13/17 1125   01/10/17 1600  metroNIDAZOLE (FLAGYL) IVPB 500 mg     500 mg 100 mL/hr over 60 Minutes Intravenous  Once 01/10/17 1518 01/10/17 1629   01/10/17 1045  cefTRIAXone (ROCEPHIN) 1 g in dextrose 5 %  50 mL IVPB     1 g 100 mL/hr over 30 Minutes Intravenous  Once 01/10/17 1035 01/10/17 1248   01/10/17 1045  azithromycin (ZITHROMAX) 500 mg in dextrose 5 % 250 mL IVPB     500 mg 250 mL/hr over 60 Minutes Intravenous  Once 01/10/17 1035 01/10/17 1353   01/10/17 1045  metroNIDAZOLE (FLAGYL) IVPB 500 mg  Status:  Discontinued     500 mg 100 mL/hr over 60 Minutes Intravenous  Once 01/10/17 1035 01/10/17 1517     Assessment/Plan Fall TBI/L SDH/B ICC/IVH - S/P decompressive craniectomy 3/15 by Dr. Franky Macho, Dr. Yetta Barre following, on Keppra. Exam has improved. Resp failure -  improved DM/Hyperglycemia - SSI, Lantus 15u BID ID - Ancef for OSSA PNA FEN - TF per CorTrak ABL anemia - CBC in AM VTE - SQ hep DIspo - CIR if continues to progress with therapies.    LOS: 12 days    Adam Phenix , [facility] 01/22/2017, 10:54 AM Pager: [phone] Consults: [phone] Mon-Fri 7:00 am-4:30 pm Sat-Sun 7:00 am-11:30 am

## 2017-01-23 ENCOUNTER — Inpatient Hospital Stay (HOSPITAL_COMMUNITY): Payer: Medicaid Other

## 2017-01-23 LAB — GLUCOSE, CAPILLARY
GLUCOSE-CAPILLARY: 188 mg/dL — AB (ref 65–99)
GLUCOSE-CAPILLARY: 192 mg/dL — AB (ref 65–99)
GLUCOSE-CAPILLARY: 196 mg/dL — AB (ref 65–99)
Glucose-Capillary: 180 mg/dL — ABNORMAL HIGH (ref 65–99)
Glucose-Capillary: 173 mg/dL — ABNORMAL HIGH (ref 65–99)
Glucose-Capillary: 166 mg/dL — ABNORMAL HIGH (ref 65–99)

## 2017-01-23 LAB — CBC
HCT: 28.5 % — ABNORMAL LOW (ref 39.0–52.0)
Hemoglobin: 9.4 g/dL — ABNORMAL LOW (ref 13.0–17.0)
MCH: 28.7 pg (ref 26.0–34.0)
MCHC: 33 g/dL (ref 30.0–36.0)
MCV: 87.2 fL (ref 78.0–100.0)
PLATELETS: 475 10*3/uL — AB (ref 150–400)
RBC: 3.27 MIL/uL — ABNORMAL LOW (ref 4.22–5.81)
RDW: 12.4 % (ref 11.5–15.5)
WBC: 12.1 10*3/uL — ABNORMAL HIGH (ref 4.0–10.5)

## 2017-01-23 LAB — BASIC METABOLIC PANEL
Anion gap: 9 (ref 5–15)
BUN: 17 mg/dL (ref 6–20)
CHLORIDE: 103 mmol/L (ref 101–111)
CO2: 26 mmol/L (ref 22–32)
Calcium: 8.4 mg/dL — ABNORMAL LOW (ref 8.9–10.3)
Creatinine, Ser: 0.48 mg/dL — ABNORMAL LOW (ref 0.61–1.24)
Glucose, Bld: 207 mg/dL — ABNORMAL HIGH (ref 65–99)
Potassium: 3.9 mmol/L (ref 3.5–5.1)
SODIUM: 138 mmol/L (ref 135–145)

## 2017-01-23 MED ORDER — LORAZEPAM 2 MG/ML IJ SOLN
1.0000 mg | INTRAMUSCULAR | Status: DC | PRN
  Administered 2017-01-23 – 2017-01-30 (×2): 1 mg via INTRAVENOUS
  Filled 2017-01-23 (×3): qty 1

## 2017-01-23 NOTE — Progress Notes (Signed)
10 Days Post-Op  Subjective: Non-verbal  Objective: Vital signs in last 24 hours: Temp:  [98.1 F (36.7 C)-99.1 F (37.3 C)] 98.8 F (37.1 C) (03/24 0500) Pulse Rate:  [96-103] 103 (03/24 0500) Resp:  [20] 20 (03/24 0500) BP: (105-152)/(65-81) 128/81 (03/24 0500) SpO2:  [98 %-100 %] 98 % (03/24 0500) Weight:  [64.5 kg (142 lb 3.2 oz)] 64.5 kg (142 lb 3.2 oz) (03/24 0444) Last BM Date: 01/21/17  Intake/Output from previous day: 03/23 0701 - 03/24 0700 In: 2250 [I.V.:1700; IV Piggyback:430] Out: 2150 [Urine:2150] Intake/Output this shift: No intake/output data recorded.  General appearance: no distress Resp: clear to auscultation bilaterally Cardio: S1, S2 normal GI: soft, NT  Neuro: awake, figity with BUE, follows some commands, WD to pain BLE slightly  Lab Results: CBC   Recent Labs  01/23/17 0449  WBC 12.1*  HGB 9.4*  HCT 28.5*  PLT 475*   BMET  Recent Labs  01/21/17 0500 01/23/17 0449  NA 138 138  K 3.8 3.9  CL 103 103  CO2 26 26  GLUCOSE 157* 207*  BUN 17 17  CREATININE 0.43* 0.48*  CALCIUM 7.8* 8.4*   PT/INR No results for input(s): LABPROT, INR in the last 72 hours. ABG No results for input(s): PHART, HCO3 in the last 72 hours.  Invalid input(s): PCO2, PO2  Studies/Results: Dg Abd Portable 1v  Result Date: 01/21/2017 CLINICAL DATA:  Nasogastric tube placement EXAM: PORTABLE ABDOMEN - 1 VIEW COMPARISON:  01/19/2017 FINDINGS: Nasogastric tube with the tip projecting over the duodenojejunal flexure. Persistent gaseous distention of small bowel and colon. There is no evidence of pneumoperitoneum, portal venous gas or pneumatosis. There are no pathologic calcifications along the expected course of the ureters. The osseous structures are unremarkable. IMPRESSION: Nasogastric tube with the tip projecting over the duodenojejunal flexure. Electronically Signed   By: Elige KoHetal  Patel   On: 01/21/2017 11:22    Anti-infectives: Anti-infectives    Start      Dose/Rate Route Frequency Ordered Stop   01/18/17 0845  ceFAZolin (ANCEF) IVPB 1 g/50 mL premix     1 g 100 mL/hr over 30 Minutes Intravenous Every 8 hours 01/18/17 0843 01/22/17 2217   01/13/17 1830  piperacillin-tazobactam (ZOSYN) IVPB 3.375 g  Status:  Discontinued     3.375 g 12.5 mL/hr over 240 Minutes Intravenous Every 8 hours 01/13/17 1016 01/18/17 0843   01/13/17 1030  piperacillin-tazobactam (ZOSYN) IVPB 3.375 g     3.375 g 100 mL/hr over 30 Minutes Intravenous  Once 01/13/17 1016 01/13/17 1125   01/10/17 1600  metroNIDAZOLE (FLAGYL) IVPB 500 mg     500 mg 100 mL/hr over 60 Minutes Intravenous  Once 01/10/17 1518 01/10/17 1629   01/10/17 1045  cefTRIAXone (ROCEPHIN) 1 g in dextrose 5 % 50 mL IVPB     1 g 100 mL/hr over 30 Minutes Intravenous  Once 01/10/17 1035 01/10/17 1248   01/10/17 1045  azithromycin (ZITHROMAX) 500 mg in dextrose 5 % 250 mL IVPB     500 mg 250 mL/hr over 60 Minutes Intravenous  Once 01/10/17 1035 01/10/17 1353   01/10/17 1045  metroNIDAZOLE (FLAGYL) IVPB 500 mg  Status:  Discontinued     500 mg 100 mL/hr over 60 Minutes Intravenous  Once 01/10/17 1035 01/10/17 1517      Assessment/Plan: Fall TBI/L SDH/B ICC/IVH - S/P decompressive craniectomy 3/15 by Dr. Franky Machoabbell, Dr. Yetta BarreJones following, on Keppra. Exam has improved. Not moving BLE much at all, NS has ordered MR  T&L spine Resp failure - improved DM/Hyperglycemia - SSI, Lantus 15u BID ID - Ancef for OSSA PNA FEN - TF per CorTrak ABL anemia -stable VTE - SQ hep DIspo - CIR if continues to progress with therapies  LOS: 13 days    [name], MD, MPH, FACS Trauma: [phone] General Surgery: [phone]  3/24/2018Patient ID: [name], male   DOB: April 22, 1966, 51 y.o.   MRN: [id number]

## 2017-01-23 NOTE — Progress Notes (Signed)
Patient premedicated before coming down for MRI. Was coughing and moving arms. Wet cough, questionable for aspiration. RN took patient back upstairs and will call doctor.

## 2017-01-23 NOTE — Progress Notes (Signed)
Pt has no s/s pain or discomfort this shift. Moving arms randomly at times. Diarrhea @ 1830 this evening. Awaiting MRI at this time. PRN Ativan ordered for pre-medication for MRI.

## 2017-01-24 LAB — GLUCOSE, CAPILLARY
GLUCOSE-CAPILLARY: 187 mg/dL — AB (ref 65–99)
GLUCOSE-CAPILLARY: 223 mg/dL — AB (ref 65–99)
GLUCOSE-CAPILLARY: 181 mg/dL — AB (ref 65–99)
Glucose-Capillary: 173 mg/dL — ABNORMAL HIGH (ref 65–99)
Glucose-Capillary: 173 mg/dL — ABNORMAL HIGH (ref 65–99)

## 2017-01-24 MED ORDER — GUAIFENESIN 200 MG PO TABS
200.0000 mg | ORAL_TABLET | ORAL | Status: DC | PRN
  Administered 2017-01-24 – 2017-01-26 (×7): 200 mg via ORAL
  Filled 2017-01-24 (×8): qty 1

## 2017-01-24 NOTE — Progress Notes (Signed)
RN went down to MRI with the patient.  Patient was very restless, continuously moving, and a lot of wet coughing.  Wet cough gave concern for aspiration.  Contacted provider who gave verbal PRN order for guaifensin.  RN will continue to montior

## 2017-01-24 NOTE — Progress Notes (Signed)
11 Days Post-Op  Subjective: Non-verbal  Objective: Vital signs in last 24 hours: Temp:  [97.6 F (36.4 C)-100 F (37.8 C)] 100 F (37.8 C) (03/25 0441) Pulse Rate:  [90-106] 106 (03/25 0441) Resp:  [20-22] 20 (03/25 0441) BP: (104-158)/(56-85) 104/63 (03/25 0441) SpO2:  [97 %-100 %] 100 % (03/25 0441) Weight:  [64.7 kg (142 lb 9 oz)] 64.7 kg (142 lb 9 oz) (03/25 0441) Last BM Date: 01/23/17  Intake/Output from previous day: 03/24 0701 - 03/25 0700 In: 20 [I.V.:20] Out: 1160 [Urine:1160] Intake/Output this shift: No intake/output data recorded.  General appearance: cooperative Head: incision Resp: clear to auscultation bilaterally Cardio: regular rate and rhythm GI: soft, flap, +BS, NT  Neuro: follows some commands, WD to pain RLE  Lab Results: CBC   Recent Labs  01/23/17 0449  WBC 12.1*  HGB 9.4*  HCT 28.5*  PLT 475*   BMET  Recent Labs  01/23/17 0449  NA 138  K 3.9  CL 103  CO2 26  GLUCOSE 207*  BUN 17  CREATININE 0.48*  CALCIUM 8.4*   PT/INR No results for input(s): LABPROT, INR in the last 72 hours. ABG No results for input(s): PHART, HCO3 in the last 72 hours.  Invalid input(s): PCO2, PO2  Studies/Results: No results found.  Anti-infectives: Anti-infectives    Start     Dose/Rate Route Frequency Ordered Stop   01/18/17 0845  ceFAZolin (ANCEF) IVPB 1 g/50 mL premix     1 g 100 mL/hr over 30 Minutes Intravenous Every 8 hours 01/18/17 0843 01/22/17 2217   01/13/17 1830  piperacillin-tazobactam (ZOSYN) IVPB 3.375 g  Status:  Discontinued     3.375 g 12.5 mL/hr over 240 Minutes Intravenous Every 8 hours 01/13/17 1016 01/18/17 0843   01/13/17 1030  piperacillin-tazobactam (ZOSYN) IVPB 3.375 g     3.375 g 100 mL/hr over 30 Minutes Intravenous  Once 01/13/17 1016 01/13/17 1125   01/10/17 1600  metroNIDAZOLE (FLAGYL) IVPB 500 mg     500 mg 100 mL/hr over 60 Minutes Intravenous  Once 01/10/17 1518 01/10/17 1629   01/10/17 1045  cefTRIAXone  (ROCEPHIN) 1 g in dextrose 5 % 50 mL IVPB     1 g 100 mL/hr over 30 Minutes Intravenous  Once 01/10/17 1035 01/10/17 1248   01/10/17 1045  azithromycin (ZITHROMAX) 500 mg in dextrose 5 % 250 mL IVPB     500 mg 250 mL/hr over 60 Minutes Intravenous  Once 01/10/17 1035 01/10/17 1353   01/10/17 1045  metroNIDAZOLE (FLAGYL) IVPB 500 mg  Status:  Discontinued     500 mg 100 mL/hr over 60 Minutes Intravenous  Once 01/10/17 1035 01/10/17 1517      Assessment/Plan: Fall TBI/L SDH/B ICC/IVH - S/P decompressive craniectomy 3/15 by Dr. Franky Machoabbell, Dr. Yetta BarreJones following, on Keppra. Exam has improved. Not moving BLE much at all, NS has ordered MR T&L spine but could not be done yesterday due to coughing. Resp failure - improved DM/Hyperglycemia - SSI, Lantus 15u BID ID - Ancef for OSSA PNA FEN - TF per CorTrak ABL anemia -stable VTE - SQ hep DIspo - CIR if continues to progress with therapies  LOS: 14 days    [name] Marieke Lubke, MD, MPH, FACS Trauma: [phone] General Surgery: [phone]  3/25/2018Patient ID: Jamie BeckwithMiguel Garcia Cardenas, male   DOB: Mar 04, 1966, 51 y.o.   MRN: [id number]

## 2017-01-24 NOTE — Progress Notes (Signed)
MRI staff called to notify RN that after multiple tries to do MRI without success, they are recommending trying other options. MD(trauma) notified.

## 2017-01-25 LAB — GLUCOSE, CAPILLARY
GLUCOSE-CAPILLARY: 213 mg/dL — AB (ref 65–99)
GLUCOSE-CAPILLARY: 199 mg/dL — AB (ref 65–99)
Glucose-Capillary: 170 mg/dL — ABNORMAL HIGH (ref 65–99)
Glucose-Capillary: 170 mg/dL — ABNORMAL HIGH (ref 65–99)
Glucose-Capillary: 195 mg/dL — ABNORMAL HIGH (ref 65–99)
Glucose-Capillary: 221 mg/dL — ABNORMAL HIGH (ref 65–99)
Glucose-Capillary: 250 mg/dL — ABNORMAL HIGH (ref 65–99)

## 2017-01-25 NOTE — Progress Notes (Signed)
qPhysical Therapy Treatment Patient Details Name: [name] MRN: [id number] DOB: June 21, 1966 Today's Date: 01/25/2017    History of Present Illness pt was found down on 3/11 after a fall on stairs the evening before.  Pt sustained R Frontal Intraparenchymal Hemorrhage, R SDH, and SAH with midline shift.  Pt also sustained non-displaced L Temporal Bone fx and L Mastoid fx.  Follow-up CT on 3/14 showed additional L Temporal Hemorrhagic Contusion.  Pt not protecting airway and then intubated once in ICU on 3/11.  On 3/12 pt underwent burr hole with placement of ICP monitor.  Pt is now s/p Frontotemporal Parietal Craniotomy with bone flap removed and placed in abdomen on 3/14.  pt with hx of DM.      PT Comments    Pt today presenting as a Rancho II with generalized responses.  Pt with increased lethargy this session and not even withdrawing from noxious stimuli in any extremity.  Pt opens eyes in response to changes of position, but only briefly.  Pt's respiration sound more wet and noted hiccup sounds at times.  SLP present and providing oral care which also did not elicit increased arousal or sustained eye opening.  Some L UE movements noted, but more in resistance to hand over hand cueing from SLP.  RN made aware of changes.  Sister present during session.  Will continue to follow.     Follow Up Recommendations  CIR     Equipment Recommendations  None recommended by PT    Recommendations for Other Services       Precautions / Restrictions Precautions Precautions: Fall;Other (comment) Precaution Comments: Bone flap removed on R. Restrictions Weight Bearing Restrictions: No    Mobility  Bed Mobility Overal bed mobility: Needs Assistance Bed Mobility: Supine to Sit;Sit to Supine     Supine to sit: Total assist;+2 for physical assistance Sit to supine: Total assist;+2 for physical assistance   General bed mobility comments: pt does not participate with bed mobility.     Transfers                    Ambulation/Gait                 Stairs            Wheelchair Mobility    Modified Rankin (Stroke Patients Only)       Balance Overall balance assessment: Needs assistance Sitting-balance support: No upper extremity supported;Feet supported Sitting balance-Leahy Scale: Zero Sitting balance - Comments: No participation in maintaining balance.                                      Cognition Arousal/Alertness: Lethargic Behavior During Therapy: Flat affect Overall Cognitive Status: Impaired/Different from baseline Area of Impairment: Attention;Following commands;Rancho level               Rancho Levels of Cognitive Functioning Rancho Los Amigos Scales of Cognitive Functioning: Generalized response (Simultaneous filing. User may not have seen previous data.)   Current Attention Level: Focused   Following Commands: Follows one step commands inconsistently (Not following directions)       General Comments: pt with increased lethargy today and not following any directions.  Even with noxious stimuli pt hardly opens eyes.  Also not withdrawing to painful stimuli today.        Exercises      General Comments  Pertinent Vitals/Pain Pain Assessment: Faces Faces Pain Scale: No hurt Pain Location: pt lethargic and flat.  Not responding to painful stimuli.      Home Living                      Prior Function            PT Goals (current goals can now be found in the care plan section) Acute Rehab PT Goals Patient Stated Goal: Per sister for pt to be able to return to her home. PT Goal Formulation: Patient unable to participate in goal setting Time For Goal Achievement: 02/03/17 Progress towards PT goals: Not progressing toward goals - comment (Increased lethargy)    Frequency    Min 3X/week      PT Plan Current plan remains appropriate    Co-evaluation PT/OT/SLP  Co-Evaluation/Treatment: Yes Reason for Co-Treatment: Complexity of the patient's impairments (multi-system involvement);Necessary to address cognition/behavior during functional activity;For patient/therapist safety (Simultaneous filing. User may not have seen previous data.) PT goals addressed during session: Mobility/safety with mobility;Balance   SLP goals addressed during session: Cognition;Communication   End of Session Equipment Utilized During Treatment: Oxygen Activity Tolerance: Patient limited by lethargy Patient left: in bed;with call bell/phone within reach;with family/visitor present Nurse Communication: Mobility status (Increased lethargy and poor oral hygiene.) PT Visit Diagnosis: Muscle weakness (generalized) (M62.81)     Time: 1000-1033 PT Time Calculation (min) (ACUTE ONLY): 33 min  Charges:  $Therapeutic Activity: 8-22 mins                    G CodesSunny Schlein, [place] [phone] 01/25/2017, 10:49 AM

## 2017-01-25 NOTE — Progress Notes (Signed)
  Speech Language Pathology Treatment: Dysphagia  Patient Details Name: Jamie BeckwithMiguel Garcia Berg MRN: [id number] DOB: 08-18-1966 Today's Date: 01/25/2017 Time: 1000-1033 SLP Time Calculation (min) (ACUTE ONLY): 33 min  Assessment / Plan / Recommendation Clinical Impression  Pts function impacted today by poor arousal. Pt opened eyes only with positional changes or max verbal stimuli or max hand over hand movement of left UE. Pt could not sustain eyes open for more than 10 seconds. Behaviors most consistent with Rancho Level II/III (generalized/localized responses). Best localized responses noted with LE when stimuli given to the palm of hand (hand squeezing, though not on command, grasping towel or tooth brush). Pt did not respond to painful stimuli anywhere today, not even bilateral hands. Again suspect interference of lethargy impacting function.   Pt noted to have poor upper airway management, coughing on thick white secretions intermittently with some gagging and hiccups noted. SLP provided thorough oral care and reinforced the importance of oral care and upright position  with sister and RN to reduce risk of pulmonary infection. Continue to recommend inpatient rehab as pt expected to progress when alert.   HPI HPI: Pt is a 51 y.o. male admitted to ED on 01/10/17 after being found down from a supposed fall. CT 3/11 shows R frontal intraparenchymal hemorrhage, R SDH, SAH, and midline shift; non-displaced L temporal bone fx; L mastoid bone fx. CT spine clear. CT 3/14 shows additional L temporal hemorrhagic contusion. S/p frontotemporal parietal craniotomy with bone flab in abdomen 3/15. CXR 3/18 shows hypoinflated lungs with potential infection. Intubated 3/11-3/20.       SLP Plan  Continue with current plan of care       Recommendations                   General recommendations: Rehab consult Oral Care Recommendations: Oral care QID Follow up Recommendations: Inpatient Rehab SLP Visit  Diagnosis: Cognitive communication deficit (A21.308(R41.841) Plan: Continue with current plan of care       GO               [facility] MercyBonnie Briggs Edelen, MA CCC-SLP [phone]  Claudine MoutonDeBlois, Jazzlynn Rawe Caroline 01/25/2017, 10:44 AM

## 2017-01-25 NOTE — Progress Notes (Addendum)
12 Days Post-Op  Subjective: Sleeping but aroused, non-verbal  Objective: Vital signs in last 24 hours: Temp:  [98 F (36.7 C)-99.3 F (37.4 C)] 98 F (36.7 C) (03/26 0427) Pulse Rate:  [83-98] 88 (03/26 0427) Resp:  [18-20] 18 (03/26 0427) BP: (95-126)/(54-87) 109/54 (03/26 0427) SpO2:  [95 %-98 %] 96 % (03/26 0427) Weight:  [66.6 kg (146 lb 14.4 oz)-67.2 kg (148 lb 1 oz)] 67.2 kg (148 lb 1 oz) (03/26 0435) Last BM Date: 01/23/17  Intake/Output from previous day: 03/25 0701 - 03/26 0700 In: 30 [I.V.:30] Out: 3600 [Urine:3600] Intake/Output this shift: No intake/output data recorded.  General appearance: no distress Resp: clear to auscultation bilaterally Cardio: regular rate and rhythm GI: soft. Flap on R, NT, +BS  Neuro: opens eyes to voice, follows some commands, moved L toes  Lab Results: CBC   Recent Labs  01/23/17 0449  WBC 12.1*  HGB 9.4*  HCT 28.5*  PLT 475*   BMET  Recent Labs  01/23/17 0449  NA 138  K 3.9  CL 103  CO2 26  GLUCOSE 207*  BUN 17  CREATININE 0.48*  CALCIUM 8.4*   PT/INR No results for input(s): LABPROT, INR in the last 72 hours. ABG No results for input(s): PHART, HCO3 in the last 72 hours.  Invalid input(s): PCO2, PO2  Studies/Results: No results found.  Anti-infectives: Anti-infectives    Start     Dose/Rate Route Frequency Ordered Stop   01/18/17 0845  ceFAZolin (ANCEF) IVPB 1 g/50 mL premix     1 g 100 mL/hr over 30 Minutes Intravenous Every 8 hours 01/18/17 0843 01/22/17 2217   01/13/17 1830  piperacillin-tazobactam (ZOSYN) IVPB 3.375 g  Status:  Discontinued     3.375 g 12.5 mL/hr over 240 Minutes Intravenous Every 8 hours 01/13/17 1016 01/18/17 0843   01/13/17 1030  piperacillin-tazobactam (ZOSYN) IVPB 3.375 g     3.375 g 100 mL/hr over 30 Minutes Intravenous  Once 01/13/17 1016 01/13/17 1125   01/10/17 1600  metroNIDAZOLE (FLAGYL) IVPB 500 mg     500 mg 100 mL/hr over 60 Minutes Intravenous  Once 01/10/17  1518 01/10/17 1629   01/10/17 1045  cefTRIAXone (ROCEPHIN) 1 g in dextrose 5 % 50 mL IVPB     1 g 100 mL/hr over 30 Minutes Intravenous  Once 01/10/17 1035 01/10/17 1248   01/10/17 1045  azithromycin (ZITHROMAX) 500 mg in dextrose 5 % 250 mL IVPB     500 mg 250 mL/hr over 60 Minutes Intravenous  Once 01/10/17 1035 01/10/17 1353   01/10/17 1045  metroNIDAZOLE (FLAGYL) IVPB 500 mg  Status:  Discontinued     500 mg 100 mL/hr over 60 Minutes Intravenous  Once 01/10/17 1035 01/10/17 1517      Assessment/Plan: Fall TBI/L SDH/B ICC/IVH - S/P decompressive craniectomy 3/15 by Dr. Franky Machoabbell, Dr. Yetta BarreJones following, on Keppra. Follows some commands. Neurosurgery ordered T and L spine MR but he was not able to get due to agitation/coughing. Is moving LE some now. Resp failure - improved DM/Hyperglycemia - SSI, Lantus 15u BID FEN - TF per [employer] ABL anemia - labs in AM VTE - SQ hep DIspo - CIR if continues to progress to higher level with therapies  LOS: 15 days    [name] Zenora Karpel, MD, MPH, FACS Trauma: [phone] General Surgery: [phone]  3/26/2018Patient ID: Jamie BeckwithMiguel Garcia Cardenas, male   DOB: [birth year], 51 y.o.   MRN: [id number]

## 2017-01-25 NOTE — Progress Notes (Signed)
Writer called in patient's room around 1430 and noted patient sweating with upper part of gown wet. Sister very concerned about sweating and why MRI was not done and writer explained to her that this happens sometimes with head injury patients. NP notified of concerns and MRI cancelled. Patient's sister came to writer again at shift change and stated that patient is having periods of apnea and want to see a doctor stat. Writer went back in room and noted patient breathing normal with periods of rests that lasts less than 2 sec. Dr. Davina Pokeornet notified and stated he will see patient. Family notified. Family needs to be educated by MD's about patient's prognosis.

## 2017-01-25 NOTE — Progress Notes (Signed)
Rehab admissions - Sleeping on my rounds today.  Not participating well with PT yet.  Noted ranchos II today.  Not ready for inpatient rehab today.  Call me for questions.  #[phone]

## 2017-01-26 ENCOUNTER — Inpatient Hospital Stay (HOSPITAL_COMMUNITY): Payer: Medicaid Other

## 2017-01-26 LAB — BASIC METABOLIC PANEL
ANION GAP: 8 (ref 5–15)
BUN: 23 mg/dL — ABNORMAL HIGH (ref 6–20)
CHLORIDE: 102 mmol/L (ref 101–111)
CO2: 28 mmol/L (ref 22–32)
Calcium: 7.8 mg/dL — ABNORMAL LOW (ref 8.9–10.3)
Creatinine, Ser: 0.49 mg/dL — ABNORMAL LOW (ref 0.61–1.24)
GFR calc Af Amer: >60 mL/min (ref >60)
GLUCOSE: 209 mg/dL — AB (ref 65–99)
POTASSIUM: 3.8 mmol/L (ref 3.5–5.1)
SODIUM: 138 mmol/L (ref 135–145)

## 2017-01-26 LAB — CBC
HCT: 25.7 % — ABNORMAL LOW (ref 39.0–52.0)
HEMOGLOBIN: 8.6 g/dL — AB (ref 13.0–17.0)
MCH: 29.6 pg (ref 26.0–34.0)
MCHC: 33.5 g/dL (ref 30.0–36.0)
MCV: 88.3 fL (ref 78.0–100.0)
Platelets: 330 10*3/uL (ref 150–400)
RBC: 2.91 MIL/uL — AB (ref 4.22–5.81)
RDW: 12.8 % (ref 11.5–15.5)
WBC: 8.6 10*3/uL (ref 4.0–10.5)

## 2017-01-26 LAB — GLUCOSE, CAPILLARY
GLUCOSE-CAPILLARY: 116 mg/dL — AB (ref 65–99)
GLUCOSE-CAPILLARY: 160 mg/dL — AB (ref 65–99)
GLUCOSE-CAPILLARY: 162 mg/dL — AB (ref 65–99)
GLUCOSE-CAPILLARY: 187 mg/dL — AB (ref 65–99)
GLUCOSE-CAPILLARY: 200 mg/dL — AB (ref 65–99)
Glucose-Capillary: 182 mg/dL — ABNORMAL HIGH (ref 65–99)
Glucose-Capillary: 99 mg/dL (ref 65–99)

## 2017-01-26 MED ORDER — HYDROCODONE-ACETAMINOPHEN 7.5-325 MG/15ML PO SOLN
10.0000 mL | ORAL | Status: DC | PRN
  Administered 2017-01-31 (×2): 10 mL
  Filled 2017-01-26 (×3): qty 15

## 2017-01-26 MED ORDER — WHITE PETROLATUM GEL
Status: AC
  Administered 2017-01-26: 23:00:00
  Filled 2017-01-26: qty 1

## 2017-01-26 NOTE — Progress Notes (Signed)
Occupational Therapy Treatment Patient Details Name: Jamie BeckwithMiguel Berg Berg MRN: [id number] DOB: [birth year] Today's Date: 01/26/2017    History of present illness pt was found down on 3/11 after a fall on stairs the evening before.  Pt sustained R Frontal Intraparenchymal Hemorrhage, R SDH, and SAH with midline shift.  Pt also sustained non-displaced L Temporal Bone fx and L Mastoid fx.  Follow-up CT on 3/14 showed additional L Temporal Hemorrhagic Contusion.  Pt not protecting airway and then intubated once in ICU on 3/11.  On 3/12 pt underwent burr hole with placement of ICP monitor.  Pt is now s/p Frontotemporal Parietal Craniotomy with bone flap removed and placed in abdomen on 3/14.  pt with hx of DM.     OT comments  Pt with increased alertness and ability to follow one step commands initially, but as he fatigued ability to follow commands decreased.  He was able to follow once step motor commands to hold up 2 fingers, hold up thumb, lift head, squeeze hand.  He was able to nod that he knew his sister, and when given choices, was able to correctly nod that her name is [name].  He maintained EOB sitting x 15 mins with max A.  He demonstrates behaviors consistent with Ranchos Level III (localized responses)  Follow Up Recommendations  CIR;Supervision/Assistance - 24 hour    Equipment Recommendations  None recommended by OT    Recommendations for Other Services Rehab consult    Precautions / Restrictions Precautions Precautions: Fall;Other (comment)       Mobility Bed Mobility Overal bed mobility: Needs Assistance Bed Mobility: Supine to Sit;Sit to Supine     Supine to sit: Total assist Sit to supine: Max assist   General bed mobility comments: Pt assists minimally with lifting trunk .  As pt fatigued, he kept attempting to lie down.  he required assist to lift LEs back onto bed and to reposition trunk   Transfers                      Balance Overall balance  assessment: Needs assistance Sitting-balance support: Single extremity supported Sitting balance-Leahy Scale: Poor Sitting balance - Comments: Pt required max A to maintain EOB.  He maintains flexed posture, and will lift his head to command x 2                                    ADL either performed or assessed with clinical judgement   ADL Overall ADL's : Needs assistance/impaired     Grooming: Wash/dry hands;Maximal assistance;Sitting                                       Vision   Additional Comments: Pt with Lt gaze preference    Perception     Praxis      Cognition Arousal/Alertness: Awake/alert;Lethargic Behavior During Therapy: Flat affect Overall Cognitive Status: Impaired/Different from baseline Area of Impairment: Attention;Following commands;Problem solving               Rancho Levels of Cognitive Functioning Rancho Los Amigos Scales of Cognitive Functioning: Localized response   Current Attention Level: Focused   Following Commands: Follows one step commands inconsistently     Problem Solving: Slow processing;Decreased initiation General Comments: Pt initially awake, but became increasingly lethargic.  He  followed simple one step motor commands to hold up 2 fingers, hold up thumb, sqeeze hand.  He was able to nod that he recognized his sister, and when given choices nod appropriately that her name is [name].  As he fatigued, his ability to follow commands decreased         Exercises     Shoulder Instructions       General Comments      Pertinent Vitals/ Pain       Pain Assessment: Faces Faces Pain Scale: No hurt  Home Living                                          Prior Functioning/Environment              Frequency  Min 3X/week        Progress Toward Goals  OT Goals(current goals can now be found in the care plan section)  Progress towards OT goals: Progressing toward  goals     Plan Discharge plan remains appropriate    Co-evaluation                 End of Session    OT Visit Diagnosis: Cognitive communication deficit (R41.841);Muscle weakness (generalized) (M62.81)   Activity Tolerance Patient limited by fatigue   Patient Left in bed;with call bell/phone within reach;with bed alarm set;with family/visitor present   Nurse Communication Mobility status        Time: 1610-9604 OT Time Calculation (min): 17 min  Charges: OT General Charges $OT Visit: 1 Procedure OT Treatments $Therapeutic Activity: 8-22 mins  [employer], OTR/L [phone]    [name] 01/26/2017, 3:11 PM

## 2017-01-26 NOTE — Progress Notes (Signed)
Nutrition Follow-up   INTERVENTION:  Continue: Glucerna 1.2 @ 55 ml/hr (1320 ml/day) 30 ml Prostat BID MVI daily Provides 1784 kcal, 109 grams protein, and 1063 ml free water.   Once IVF's are d/c'ed recommend: 200 ml free water every 6 hours  NUTRITION DIAGNOSIS:   Increased nutrient needs related to  (TBI) as evidenced by estimated needs.  ongoing  GOAL:   Patient will meet greater than or equal to 90% of their needs  Being met  MONITOR:   TF tolerance, Diet advancement, Labs  REASON FOR ASSESSMENT:   Consult Enteral/tube feeding initiation and management  ASSESSMENT:   Pt admitted after a fall while intoxicated with TBI, L SDH, B ICC, IVH. Pt with hyperglycemia on admission with hx of DM.   Pt continues to received Glucerna 1.2 @ 55 ml/hr via Cortrak NGT with 30 ml water flushes every 4 hours and IV fluids at 50 ml/hr. Pt asleep at time of visit. No problems per RN.  Pt's weight remains stable.   Labs: low calcium, glucose ranging 162 to 250 mg/dL  Diet Order:  Diet NPO time specified  Skin:  Reviewed, no issues (right head incision)  Last BM:  3/27  Height:   Ht Readings from Last 1 Encounters:  01/10/17 '5\' 1"'$  (1.549 m)    Weight:   Wt Readings from Last 1 Encounters:  01/26/17 145 lb 5 oz (65.9 kg)    Ideal Body Weight:  59 kg  BMI:  Body mass index is 27.46 kg/m.  Estimated Nutritional Needs:   Kcal:  1700-1900  Protein:  100-120 grams  Fluid:  > 1.8 L/day  EDUCATION NEEDS:   No education needs identified at this time  Scarlette Ar RD, LDN, CSP Inpatient Clinical Dietitian Pager: [phone] After Hours Pager: [phone]

## 2017-01-26 NOTE — Progress Notes (Signed)
Patients Oxygen saturation dropped to 82 and maintained there for about 5 minutes.  RN increased patient's oxygen to 4L.  Patient's oxygen saturation increased to above 90 and maintained.  RN did contacted rapid response before patient's O2 sat increased.  However patient's O2 sat increased while on the phone with rapid response.  RN will continue to monitor patient

## 2017-01-26 NOTE — Progress Notes (Signed)
13 Days Post-Op  Subjective: CC - Non-verbal  Objective: Vital signs in last 24 hours: Temp:  [97.7 F (36.5 C)-99.2 F (37.3 C)] 99.2 F (37.3 C) (03/27 0549) Pulse Rate:  [98-101] 100 (03/27 0549) Resp:  [18-20] 20 (03/27 0549) BP: (105-128)/(61-73) 128/63 (03/27 0549) SpO2:  [94 %-98 %] 94 % (03/27 0549) Weight:  [65.9 kg (145 lb 5 oz)] 65.9 kg (145 lb 5 oz) (03/27 0500) Last BM Date: 01/26/17  Intake/Output from previous day: 03/26 0701 - 03/27 0700 In: 980 [I.V.:550; IV Piggyback:430] Out: 850 [Urine:850] Intake/Output this shift: Total I/O In: -  Out: 200 [Urine:200]  General appearance: no distress Resp: clear to auscultation bilaterally Cardio: regular rate and rhythm GI: soft, NT, ND Male genitalia: foley Neuro: awake, spont movement BUE, not F/C today, WD to pain LE  Lab Results: CBC   Recent Labs  01/26/17 0417  WBC 8.6  HGB 8.6*  HCT 25.7*  PLT 330   BMET  Recent Labs  01/26/17 0417  NA 138  K 3.8  CL 102  CO2 28  GLUCOSE 209*  BUN 23*  CREATININE 0.49*  CALCIUM 7.8*   PT/INR No results for input(s): LABPROT, INR in the last 72 hours. ABG No results for input(s): PHART, HCO3 in the last 72 hours.  Invalid input(s): PCO2, PO2  Studies/Results: No results found.  Anti-infectives: Anti-infectives    Start     Dose/Rate Route Frequency Ordered Stop   01/18/17 0845  ceFAZolin (ANCEF) IVPB 1 g/50 mL premix     1 g 100 mL/hr over 30 Minutes Intravenous Every 8 hours 01/18/17 0843 01/22/17 2217   01/13/17 1830  piperacillin-tazobactam (ZOSYN) IVPB 3.375 g  Status:  Discontinued     3.375 g 12.5 mL/hr over 240 Minutes Intravenous Every 8 hours 01/13/17 1016 01/18/17 0843   01/13/17 1030  piperacillin-tazobactam (ZOSYN) IVPB 3.375 g     3.375 g 100 mL/hr over 30 Minutes Intravenous  Once 01/13/17 1016 01/13/17 1125   01/10/17 1600  metroNIDAZOLE (FLAGYL) IVPB 500 mg     500 mg 100 mL/hr over 60 Minutes Intravenous  Once 01/10/17 1518  01/10/17 1629   01/10/17 1045  cefTRIAXone (ROCEPHIN) 1 g in dextrose 5 % 50 mL IVPB     1 g 100 mL/hr over 30 Minutes Intravenous  Once 01/10/17 1035 01/10/17 1248   01/10/17 1045  azithromycin (ZITHROMAX) 500 mg in dextrose 5 % 250 mL IVPB     500 mg 250 mL/hr over 60 Minutes Intravenous  Once 01/10/17 1035 01/10/17 1353   01/10/17 1045  metroNIDAZOLE (FLAGYL) IVPB 500 mg  Status:  Discontinued     500 mg 100 mL/hr over 60 Minutes Intravenous  Once 01/10/17 1035 01/10/17 1517      Assessment/Plan: Fall TBI/L SDH/B ICC/IVH - S/P decompressive craniectomy 3/15 by Dr. Franky Machoabbell, Dr. Yetta BarreJones following, on Keppra. Follows some commands. Decrease pain meds to improve wakefulness Acute urinary retention - remove foley, try condom cath, continue urecholine DM/Hyperglycemia - SSI, Lantus 15u BID FEN - TF per [employer] ABL anemia - labs in AM VTE - SQ hep DIspo - CIR if continues to progress to higher level with therapies I spoke with his sister at the bedside  LOS: 16 days    [name] [name], MD, MPH, FACS Trauma: [phone] General Surgery: [phone]  3/27/2018Patient ID: Jamie BeckwithMiguel Garcia Berg, male   DOB: December 21, 1965, 51 y.o.   MRN: [id number]

## 2017-01-27 LAB — GLUCOSE, CAPILLARY
GLUCOSE-CAPILLARY: 134 mg/dL — AB (ref 65–99)
GLUCOSE-CAPILLARY: 136 mg/dL — AB (ref 65–99)
Glucose-Capillary: 141 mg/dL — ABNORMAL HIGH (ref 65–99)
Glucose-Capillary: 160 mg/dL — ABNORMAL HIGH (ref 65–99)

## 2017-01-27 LAB — URINALYSIS, ROUTINE W REFLEX MICROSCOPIC
Bilirubin Urine: NEGATIVE
Glucose, UA: NEGATIVE mg/dL
KETONES UR: NEGATIVE mg/dL
NITRITE: POSITIVE — AB
PROTEIN: 30 mg/dL — AB
Specific Gravity, Urine: 1.017 (ref 1.005–1.030)
pH: 7 (ref 5.0–8.0)

## 2017-01-27 MED ORDER — MAGNESIUM HYDROXIDE 400 MG/5ML PO SUSP
30.0000 mL | Freq: Every day | ORAL | Status: DC
  Administered 2017-01-29 – 2017-02-01 (×4): 30 mL
  Filled 2017-01-27 (×4): qty 30

## 2017-01-27 MED ORDER — GUAIFENESIN 200 MG PO TABS
200.0000 mg | ORAL_TABLET | ORAL | Status: DC | PRN
  Administered 2017-01-27: 200 mg
  Filled 2017-01-27 (×2): qty 1

## 2017-01-27 MED ORDER — ACETAMINOPHEN 160 MG/5ML PO SOLN
650.0000 mg | ORAL | Status: DC | PRN
  Administered 2017-01-28: 650 mg
  Filled 2017-01-27: qty 20.3

## 2017-01-27 MED ORDER — GUAIFENESIN 100 MG/5ML PO SOLN
10.0000 mL | ORAL | Status: DC | PRN
  Administered 2017-01-27 – 2017-01-31 (×8): 200 mg
  Filled 2017-01-27 (×6): qty 10
  Filled 2017-01-27: qty 20
  Filled 2017-01-27 (×2): qty 10

## 2017-01-27 MED ORDER — BETHANECHOL CHLORIDE 10 MG PO TABS
10.0000 mg | ORAL_TABLET | Freq: Three times a day (TID) | ORAL | Status: DC
  Administered 2017-01-27 – 2017-02-01 (×15): 10 mg
  Filled 2017-01-27 (×15): qty 1

## 2017-01-27 NOTE — Progress Notes (Signed)
qPhysical Therapy Treatment Patient Details Name: [name] MRN: [id number] DOB: 09/27/1966 Today's Date: 01/27/2017    History of Present Illness pt was found down on 3/11 after a fall on stairs the evening before.  Pt sustained R Frontal Intraparenchymal Hemorrhage, R SDH, and SAH with midline shift.  Pt also sustained non-displaced L Temporal Bone fx and L Mastoid fx.  Follow-up CT on 3/14 showed additional L Temporal Hemorrhagic Contusion.  Pt not protecting airway and then intubated once in ICU on 3/11.  On 3/12 pt underwent burr hole with placement of ICP monitor.  Pt is now s/p Frontotemporal Parietal Craniotomy with bone flap removed and placed in abdomen on 3/14.  pt with hx of DM.      PT Comments    Pt progressing towards physical therapy goals. Pt seen with SLP for TBI team interventions. This session, pt demonstrates behaviors consistent with Rancho III (localized response), with some emerging Rancho IV (confused, agitated) behaviors noted. Pt was able to follow 1 step commands including reaching for a spoon, scratching his nose, wiping his mouth, and grabbing his ear. He tolerated EOB sitting x20 minutes and attempted sit<>stand transfer with +2 total assistance. Will continue to follow.   Follow Up Recommendations  CIR     Equipment Recommendations  None recommended by PT    Recommendations for Other Services Rehab consult;OT consult     Precautions / Restrictions Precautions Precautions: Fall Precaution Comments: Bone flap removed on R; NG tube Restrictions Weight Bearing Restrictions: No    Mobility  Bed Mobility Overal bed mobility: Needs Assistance Bed Mobility: Supine to Sit;Sit to Supine     Supine to sit: Total assist;+2 for physical assistance Sit to supine: Total assist;+2 for physical assistance   General bed mobility comments: Pt appears to attempt to raise shoulders somewhat during transition to EOB, and then fatigues. Essentially a total  assist for transfer to and from EOB.   Transfers Overall transfer level: Needs assistance Equipment used: 2 person hand held assist Transfers: Sit to/from Stand Sit to Stand: Total assist;+2 physical assistance         General transfer comment: Bed pad used for assist in clearing hips from bed to attempt stand. Pt was asked if he could stand up and he nods yes, however no initiation of LE musculature noted.  Ambulation/Gait                 Stairs            Wheelchair Mobility    Modified Rankin (Stroke Patients Only) Modified Rankin (Stroke Patients Only) Pre-Morbid Rankin Score: No symptoms Modified Rankin: Severe disability     Balance Overall balance assessment: Needs assistance Sitting-balance support: Bilateral upper extremity supported;Feet supported Sitting balance-Leahy Scale: Zero Sitting balance - Comments: Pt required max A to maintain EOB.  He maintains flexed posture and requires assist to raise head.                                     Cognition Arousal/Alertness: Awake/alert;Lethargic Behavior During Therapy: Flat affect Overall Cognitive Status: Impaired/Different from baseline Area of Impairment: Attention;Following commands;Problem solving               Rancho Levels of Cognitive Functioning Rancho Los Amigos Scales of Cognitive Functioning: Localized response Orientation Level: Disoriented to;Place;Time;Situation Current Attention Level: Focused   Following Commands: Follows one step commands inconsistently;Follows one  step commands with increased time   Awareness: Intellectual Problem Solving: Slow processing;Decreased initiation;Requires verbal cues;Requires tactile cues General Comments: Pt more alert in sitting EOB. Was able to repeat the phrase "My name is [name]" in spanish. Noted response to painful stimuli in B feet (L side with a larger response than the R).       Exercises      General Comments         Pertinent Vitals/Pain Pain Assessment: Faces Faces Pain Scale: Hurts a little bit Pain Location: Grimacing with general movement Pain Descriptors / Indicators: Grimacing Pain Intervention(s): Monitored during session    Home Living                      Prior Function            PT Goals (current goals can now be found in the care plan section) Acute Rehab PT Goals Patient Stated Goal: Per sister for pt to be able to return to her home. PT Goal Formulation: Patient unable to participate in goal setting Time For Goal Achievement: 02/03/17 Progress towards PT goals: Progressing toward goals    Frequency    Min 3X/week      PT Plan Current plan remains appropriate    Co-evaluation PT/OT/SLP Co-Evaluation/Treatment: Yes Reason for Co-Treatment: Complexity of the patient's impairments (multi-system involvement);Necessary to address cognition/behavior during functional activity PT goals addressed during session: Mobility/safety with mobility;Balance   SLP goals addressed during session: Swallowing;Cognition;Communication   End of Session Equipment Utilized During Treatment: Oxygen Activity Tolerance: Patient limited by lethargy Patient left: in bed;with call bell/phone within reach;with family/visitor present Nurse Communication: Mobility status PT Visit Diagnosis: Muscle weakness (generalized) (M62.81)     Time: [id number] PT Time Calculation (min) (ACUTE ONLY): 55 min  Charges:  $Therapeutic Activity: 23-37 mins                    G Codes:       Conni SlipperLaura Jaylani Mcguinn, PT, DPT Acute Rehabilitation Services Pager: [phone]   Jamie PearsonLaura D Kerina Berg 01/27/2017, 3:23 PM

## 2017-01-27 NOTE — Progress Notes (Signed)
14 Days Post-Op  CC: nonverbal  Subjective: Sister reports no issues. Did a little more yesterday  Objective: Vital signs in last 24 hours: Temp:  [98 F (36.7 C)-99.3 F (37.4 C)] 98.7 F (37.1 C) (03/28 0532) Pulse Rate:  [87-103] 87 (03/28 0532) Resp:  [16-20] 20 (03/28 0532) BP: (107-122)/(62-69) 122/63 (03/28 0532) SpO2:  [94 %-99 %] 94 % (03/28 0532) Weight:  [68.9 kg (151 lb 12.8 oz)] 68.9 kg (151 lb 12.8 oz) (03/28 0450) Last BM Date: 01/26/17  Intake/Output from previous day: 03/27 0701 - 03/28 0700 In: 7000.8 [I.V.:400.8; NG/GT:6600] Out: 1250 [Urine:1250] Intake/Output this shift: No intake/output data recorded.  Awake, no FC Mittens Pupils equal; scalp wound ok cta b/l Reg Soft, nt, nd No edema, good pulse. Condom cath   Lab Results:   Recent Labs  01/26/17 0417  WBC 8.6  HGB 8.6*  HCT 25.7*  PLT 330   BMET  Recent Labs  01/26/17 0417  NA 138  K 3.8  CL 102  CO2 28  GLUCOSE 209*  BUN 23*  CREATININE 0.49*  CALCIUM 7.8*   PT/INR No results for input(s): LABPROT, INR in the last 72 hours. ABG No results for input(s): PHART, HCO3 in the last 72 hours.  Invalid input(s): PCO2, PO2  Studies/Results: Ct Head Wo Contrast  Result Date: 01/26/2017 CLINICAL DATA:  Head injury follow-up EXAM: CT HEAD WITHOUT CONTRAST TECHNIQUE: Contiguous axial images were obtained from the base of the skull through the vertex without intravenous contrast. COMPARISON:  01/16/2017 FINDINGS: Brain: Extensive hemorrhagic contusion in the right frontal and temporal lobe. Interval improvement in high-density hemorrhage since the prior study. Low-density edema/ encephalomalacia remains in the right frontal and temporal lobe without progression. Resolving pneumocephalus on the right. Small right subdural fluid collection is lower density than the prior study. No new area of subdural hemorrhage. Ventricle size normal. Small amount of blood in the left occipital horn has  improved. Mass-effect on the right lateral ventricle is improved. Improvement in midline shift which is now minimal. No acute infarct. Vascular: No hyperdense vessel or unexpected calcification. Skull: Right from upright craniectomy. Soft tissue swelling and gas in the scalp improved since the prior study. Sinuses/Orbits: Negative Other: None IMPRESSION: Hemorrhagic contusion right frontotemporal lobe shows interval improvement. Decrease in blood products. No evidence of new hemorrhage. Resolving small right-sided subdural hematoma. Improving intraventricular hemorrhage. No hydrocephalus. Midline shift has improved in the interval. Electronically Signed   By: Marlan Palau M.D.   On: 01/26/2017 11:43    Anti-infectives: Anti-infectives    Start     Dose/Rate Route Frequency Ordered Stop   01/18/17 0845  ceFAZolin (ANCEF) IVPB 1 g/50 mL premix     1 g 100 mL/hr over 30 Minutes Intravenous Every 8 hours 01/18/17 0843 01/22/17 2217   01/13/17 1830  piperacillin-tazobactam (ZOSYN) IVPB 3.375 g  Status:  Discontinued     3.375 g 12.5 mL/hr over 240 Minutes Intravenous Every 8 hours 01/13/17 1016 01/18/17 0843   01/13/17 1030  piperacillin-tazobactam (ZOSYN) IVPB 3.375 g     3.375 g 100 mL/hr over 30 Minutes Intravenous  Once 01/13/17 1016 01/13/17 1125   01/10/17 1600  metroNIDAZOLE (FLAGYL) IVPB 500 mg     500 mg 100 mL/hr over 60 Minutes Intravenous  Once 01/10/17 1518 01/10/17 1629   01/10/17 1045  cefTRIAXone (ROCEPHIN) 1 g in dextrose 5 % 50 mL IVPB     1 g 100 mL/hr over 30 Minutes Intravenous  Once 01/10/17  1035 01/10/17 1248   01/10/17 1045  azithromycin (ZITHROMAX) 500 mg in dextrose 5 % 250 mL IVPB     500 mg 250 mL/hr over 60 Minutes Intravenous  Once 01/10/17 1035 01/10/17 1353   01/10/17 1045  metroNIDAZOLE (FLAGYL) IVPB 500 mg  Status:  Discontinued     500 mg 100 mL/hr over 60 Minutes Intravenous  Once 01/10/17 1035 01/10/17 1517      Assessment/Plan: s/p  Procedure(s): 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, Placement of bone flap in abdomen (Right) Fall TBI/L SDH/B ICC/IVH- S/P decompressive craniectomy 3/15 by Dr. Franky Machoabbell, Dr. Yetta BarreJones following, on Keppra. Follows some commands. Decrease pain meds to improve wakefulness; CT head 3/27 reviewed and shows improvement Acute urinary retention- remove foley, try condom cath, continue urecholine DM/Hyperglycemia- SSI, Lantus 15u BID; blood sugars ok FEN- TF per [employer] ABL anemia - labs in AM VTE- SQ hep DIspo- CIR if continues to progress to higher level with therapies I spoke with his sister at the bedside  Mary SellaEric M. Andrey CampanileWilson, MD, FACS General, Bariatric, & Minimally In[facility] [place]   LOS: 17 days    Atilano InaWILSON,Lavante Toso M 01/27/2017

## 2017-01-27 NOTE — Progress Notes (Signed)
MD notified patient's urine bloody and red. Patient's sister states patient has been rubbing his back on right side, states she believes he is in pain on right side. MD notified.

## 2017-01-27 NOTE — Progress Notes (Signed)
NEUROSURGERY PROGRESS NOTE  Doing well. Alert and responding to his sister who is speaking in spanish. Follows simple commands.  Incision CDI  Temp:  [98 F (36.7 C)-99.3 F (37.4 C)] 98.7 F (37.1 C) (03/28 0532) Pulse Rate:  [87-103] 87 (03/28 0532) Resp:  [16-20] 20 (03/28 0532) BP: (107-122)/(62-69) 122/63 (03/28 0532) SpO2:  [94 %-99 %] 94 % (03/28 0532) Weight:  [68.9 kg (151 lb 12.8 oz)] 68.9 kg (151 lb 12.8 oz) (03/28 0450)   Sherryl MangesKimberly Hannah Brenson Hartman, NP 01/27/2017 8:43 AM

## 2017-01-27 NOTE — Progress Notes (Signed)
  Speech Language Pathology Treatment: Dysphagia;Cognitive-Linquistic  Patient Details Name: Loni BeckwithMiguel Garcia Cardenas MRN: [id number] DOB: April 18, 1966 Today's Date: 01/27/2017 Time: 1400-1430 SLP Time Calculation (min) (ACUTE ONLY): 30 min  Assessment / Plan / Recommendation Clinical Impression  Pt demonstrates ongoing improvement with arousal and cognition. Pt seen with PT/SLP for TBI team interventions; demonstrates behaviors most consistent with Rancho III (localized response) with some emerging purposeful behaviors associated with Rancho IV (confused, agitated) including reaching for spoon, gripping and feeding himself ice. Pt was able to initiate swallow response, though movement was quite slow and delayed, labial seal poor with severe anterior spillage. Pt followed about 80% of single verbal commands that were not associated with LE movement and also responded verbally x3 during session, saying in spanish a full sentence "me llamo Irving ShowsMiguel." Discussed TBI education with sister. Continue to recommend CIR.    HPI HPI: Pt is a 51 y.o. male admitted to ED on 01/10/17 after being found down from a supposed fall. CT 3/11 shows R frontal intraparenchymal hemorrhage, R SDH, SAH, and midline shift; non-displaced L temporal bone fx; L mastoid bone fx. CT spine clear. CT 3/14 shows additional L temporal hemorrhagic contusion. S/p frontotemporal parietal craniotomy with bone flab in abdomen 3/15. CXR 3/18 shows hypoinflated lungs with potential infection. Intubated 3/11-3/20.       SLP Plan  Continue with current plan of care       Recommendations  Diet recommendations: NPO                Plan: Continue with current plan of care       GO                Allexis Bordenave, Riley NearingBonnie Caroline 01/27/2017, 3:03 PM

## 2017-01-27 NOTE — Progress Notes (Signed)
Blood in urine.  Possible sediment.  Could be a stone.  Will check UA.  Possible CT abdomen tomorrow if persists.  Marta LamasJames O. Gae BonWyatt, III, MD, FACS [phone] Trauma Surgeon

## 2017-01-28 LAB — CBC
HCT: 26.2 % — ABNORMAL LOW (ref 39.0–52.0)
HEMOGLOBIN: 8.6 g/dL — AB (ref 13.0–17.0)
MCH: 28.3 pg (ref 26.0–34.0)
MCHC: 32.8 g/dL (ref 30.0–36.0)
MCV: 86.2 fL (ref 78.0–100.0)
Platelets: 413 10*3/uL — ABNORMAL HIGH (ref 150–400)
RBC: 3.04 MIL/uL — AB (ref 4.22–5.81)
RDW: 12.7 % (ref 11.5–15.5)
WBC: 8.7 10*3/uL (ref 4.0–10.5)

## 2017-01-28 LAB — BASIC METABOLIC PANEL
ANION GAP: 8 (ref 5–15)
BUN: 16 mg/dL (ref 6–20)
CHLORIDE: 106 mmol/L (ref 101–111)
CO2: 28 mmol/L (ref 22–32)
Calcium: 8.4 mg/dL — ABNORMAL LOW (ref 8.9–10.3)
Creatinine, Ser: 0.45 mg/dL — ABNORMAL LOW (ref 0.61–1.24)
GFR calc non Af Amer: >60 mL/min (ref >60)
Glucose, Bld: 166 mg/dL — ABNORMAL HIGH (ref 65–99)
POTASSIUM: 3.9 mmol/L (ref 3.5–5.1)
Sodium: 142 mmol/L (ref 135–145)

## 2017-01-28 LAB — GLUCOSE, CAPILLARY
GLUCOSE-CAPILLARY: 102 mg/dL — AB (ref 65–99)
GLUCOSE-CAPILLARY: 135 mg/dL — AB (ref 65–99)
GLUCOSE-CAPILLARY: 192 mg/dL — AB (ref 65–99)
GLUCOSE-CAPILLARY: 222 mg/dL — AB (ref 65–99)
Glucose-Capillary: 149 mg/dL — ABNORMAL HIGH (ref 65–99)
Glucose-Capillary: 157 mg/dL — ABNORMAL HIGH (ref 65–99)
Glucose-Capillary: 127 mg/dL — ABNORMAL HIGH (ref 65–99)

## 2017-01-28 MED ORDER — CIPROFLOXACIN IN D5W 400 MG/200ML IV SOLN
400.0000 mg | Freq: Two times a day (BID) | INTRAVENOUS | Status: DC
  Administered 2017-01-28 – 2017-02-01 (×9): 400 mg via INTRAVENOUS
  Filled 2017-01-28 (×9): qty 200

## 2017-01-28 NOTE — Progress Notes (Signed)
15 Days Post-Op  Subjective: CC - Non-verbal  Objective: Vital signs in last 24 hours: Temp:  [97.7 F (36.5 C)-98.6 F (37 C)] 97.7 F (36.5 C) (03/29 0859) Pulse Rate:  [79-96] 93 (03/29 0859) Resp:  [16-20] 16 (03/29 0859) BP: (110-136)/(61-73) 123/73 (03/29 0859) SpO2:  [93 %-99 %] 96 % (03/29 0859) Weight:  [63.7 kg (140 lb 8 oz)] 63.7 kg (140 lb 8 oz) (03/29 0400) Last BM Date: 01/28/17  Intake/Output from previous day: 03/28 0701 - 03/29 0700 In: 20 [I.V.:20] Out: 1101 [Urine:1100; Stool:1] Intake/Output this shift: No intake/output data recorded.  General appearance: cooperative Nose: Cortrak Resp: clear to auscultation bilaterally Cardio: regular rate and rhythm GI: soft, NT, flap  Neuro: follows some commands  Lab Results: CBC   Recent Labs  01/26/17 0417 01/28/17 0503  WBC 8.6 8.7  HGB 8.6* 8.6*  HCT 25.7* 26.2*  PLT 330 413*   BMET  Recent Labs  01/26/17 0417 01/28/17 0503  NA 138 142  K 3.8 3.9  CL 102 106  CO2 28 28  GLUCOSE 209* 166*  BUN 23* 16  CREATININE 0.49* 0.45*  CALCIUM 7.8* 8.4*   PT/INR No results for input(s): LABPROT, INR in the last 72 hours. ABG No results for input(s): PHART, HCO3 in the last 72 hours.  Invalid input(s): PCO2, PO2  Studies/Results: Ct Head Wo Contrast  Result Date: 01/26/2017 CLINICAL DATA:  Head injury follow-up EXAM: CT HEAD WITHOUT CONTRAST TECHNIQUE: Contiguous axial images were obtained from the base of the skull through the vertex without intravenous contrast. COMPARISON:  01/16/2017 FINDINGS: Brain: Extensive hemorrhagic contusion in the right frontal and temporal lobe. Interval improvement in high-density hemorrhage since the prior study. Low-density edema/ encephalomalacia remains in the right frontal and temporal lobe without progression. Resolving pneumocephalus on the right. Small right subdural fluid collection is lower density than the prior study. No new area of subdural hemorrhage.  Ventricle size normal. Small amount of blood in the left occipital horn has improved. Mass-effect on the right lateral ventricle is improved. Improvement in midline shift which is now minimal. No acute infarct. Vascular: No hyperdense vessel or unexpected calcification. Skull: Right from upright craniectomy. Soft tissue swelling and gas in the scalp improved since the prior study. Sinuses/Orbits: Negative Other: None IMPRESSION: Hemorrhagic contusion right frontotemporal lobe shows interval improvement. Decrease in blood products. No evidence of new hemorrhage. Resolving small right-sided subdural hematoma. Improving intraventricular hemorrhage. No hydrocephalus. Midline shift has improved in the interval. Electronically Signed   By: Marlan Palau M.D.   On: 01/26/2017 11:43    Anti-infectives: Anti-infectives    Start     Dose/Rate Route Frequency Ordered Stop   01/28/17 0930  ciprofloxacin (CIPRO) IVPB 400 mg     400 mg 200 mL/hr over 60 Minutes Intravenous Every 12 hours 01/28/17 0926 02/02/17 0929   01/18/17 0845  ceFAZolin (ANCEF) IVPB 1 g/50 mL premix     1 g 100 mL/hr over 30 Minutes Intravenous Every 8 hours 01/18/17 0843 01/22/17 2217   01/13/17 1830  piperacillin-tazobactam (ZOSYN) IVPB 3.375 g  Status:  Discontinued     3.375 g 12.5 mL/hr over 240 Minutes Intravenous Every 8 hours 01/13/17 1016 01/18/17 0843   01/13/17 1030  piperacillin-tazobactam (ZOSYN) IVPB 3.375 g     3.375 g 100 mL/hr over 30 Minutes Intravenous  Once 01/13/17 1016 01/13/17 1125   01/10/17 1600  metroNIDAZOLE (FLAGYL) IVPB 500 mg     500 mg 100 mL/hr over 60  Minutes Intravenous  Once 01/10/17 1518 01/10/17 1629   01/10/17 1045  cefTRIAXone (ROCEPHIN) 1 g in dextrose 5 % 50 mL IVPB     1 g 100 mL/hr over 30 Minutes Intravenous  Once 01/10/17 1035 01/10/17 1248   01/10/17 1045  azithromycin (ZITHROMAX) 500 mg in dextrose 5 % 250 mL IVPB     500 mg 250 mL/hr over 60 Minutes Intravenous  Once 01/10/17 1035  01/10/17 1353   01/10/17 1045  metroNIDAZOLE (FLAGYL) IVPB 500 mg  Status:  Discontinued     500 mg 100 mL/hr over 60 Minutes Intravenous  Once 01/10/17 1035 01/10/17 1517      Assessment/Plan: Fall TBI/L SDH/B ICC/IVH- S/P decompressive craniectomy 3/15 by Dr. Franky Machoabbell, Dr. Yetta BarreJones following, on Keppra. Follows some commands. Decrease pain meds to improve wakefulness; CT head 3/27 reviewed and shows improvement, TBI team therapies UTI - hematuria has cleared, CX P, start Cipro IV (cannot give through tube) DM/Hyperglycemia- SSI, Lantus 15u BID FEN- TF per [employer] ABL anemia - labs in AM VTE- SQ hep DIspo- CIR I spoke with his sister at the bedside.  LOS: 18 days    Jamie GelinasBurke Sinan Tuch, MD, MPH, FACS Trauma: [phone] General Surgery: [phone]  3/29/2018Patient ID: Jamie BeckwithMiguel Garcia Berg, male   DOB: May 16, 1966, 51 y.o.   MRN: [id number]

## 2017-01-28 NOTE — Progress Notes (Signed)
Occupational Therapy Treatment Patient Details Name: Jamie BeckwithMiguel Garcia Berg MRN: [id number] DOB: 12-21-1965 Today's Date: 01/28/2017    History of present illness pt was found down on 3/11 after a fall on stairs the evening before.  Pt sustained R Frontal Intraparenchymal Hemorrhage, R SDH, and SAH with midline shift.  Pt also sustained non-displaced L Temporal Bone fx and L Mastoid fx.  Follow-up CT on 3/14 showed additional L Temporal Hemorrhagic Contusion.  Pt not protecting airway and then intubated once in ICU on 3/11.  On 3/12 pt underwent burr hole with placement of ICP monitor.  Pt is now s/p Frontotemporal Parietal Craniotomy with bone flap removed and placed in abdomen on 3/14.  pt with hx of DM.     OT comments  Pt more sluggish today and lethargic. He follows one step motor commands ~30% of the time.  He spontaneously picked up cloth to wipe mouth/face.  He demonstrates behaviors consistent with Ranchos Level III (localized responses).  Follow Up Recommendations  CIR;Supervision/Assistance - 24 hour    Equipment Recommendations  None recommended by OT    Recommendations for Other Services Rehab consult    Precautions / Restrictions Precautions Precautions: Fall       Mobility Bed Mobility Overal bed mobility: Needs Assistance Bed Mobility: Supine to Sit;Sit to Supine     Supine to sit: Total assist Sit to supine: Total assist   General bed mobility comments: Pt did not attempt to assist   Transfers                 General transfer comment: unable to safely attempt     Balance Overall balance assessment: Needs assistance Sitting-balance support: Feet supported Sitting balance-Leahy Scale: Poor Sitting balance - Comments: Pt requires mod A - max A for EOB sitting.  He maintains flexed posture with head/neck flexed.  Does not attempt to shift weight or right posture                                   ADL either performed or assessed with  clinical judgement   ADL Overall ADL's : Needs assistance/impaired     Grooming: Sitting;Moderate assistance;Wash/dry Surveyor, mineralsface                                       Vision       Perception     Praxis      Cognition Arousal/Alertness: Lethargic Behavior During Therapy: Flat affect Overall Cognitive Status: Impaired/Different from baseline Area of Impairment: Attention;Following commands               Rancho Levels of Cognitive Functioning Rancho Los Amigos Scales of Cognitive Functioning: Localized response   Current Attention Level: Focused   Following Commands: Follows one step commands inconsistently;Follows one step commands with increased time     Problem Solving: Slow processing;Decreased initiation;Requires verbal cues;Requires tactile cues General Comments: Pt lethargic today.  Follows one step commands very sluggishly and inconsistently.   He did spontaneously pick up wash cloth to wipe mouth/face.  He followed commands ~30% of the time         Exercises     Shoulder Instructions       General Comments No withdrawal to pain bil. LEs     Pertinent Vitals/ Pain       Pain  Assessment: Faces Faces Pain Scale: No hurt  Home Living                                          Prior Functioning/Environment              Frequency  Min 3X/week        Progress Toward Goals  OT Goals(current goals can now be found in the care plan section)  Progress towards OT goals: Progressing toward goals     Plan Discharge plan remains appropriate    Co-evaluation                 End of Session Equipment Utilized During Treatment: Oxygen  OT Visit Diagnosis: Cognitive communication deficit (R41.841);Muscle weakness (generalized) (M62.81)   Activity Tolerance Patient limited by fatigue;Patient limited by lethargy   Patient Left in bed;with call bell/phone within reach;with bed alarm set;with family/visitor  present   Nurse Communication Mobility status        Time: 1610-9604 OT Time Calculation (min): 30 min  Charges: OT General Charges $OT Visit: 1 Procedure OT Treatments $Therapeutic Activity: 23-37 mins  [employer], OTR/L [phone]    Jeani Hawking M 01/28/2017, 4:26 PM

## 2017-01-28 NOTE — Progress Notes (Signed)
Urine culture specimen obtained via u/a specimen. Requisition form sent to lab and family updated. RN will continue to monitor patient.

## 2017-01-29 LAB — GLUCOSE, CAPILLARY
GLUCOSE-CAPILLARY: 124 mg/dL — AB (ref 65–99)
GLUCOSE-CAPILLARY: 110 mg/dL — AB (ref 65–99)
Glucose-Capillary: 147 mg/dL — ABNORMAL HIGH (ref 65–99)
Glucose-Capillary: 150 mg/dL — ABNORMAL HIGH (ref 65–99)
Glucose-Capillary: 161 mg/dL — ABNORMAL HIGH (ref 65–99)
Glucose-Capillary: 162 mg/dL — ABNORMAL HIGH (ref 65–99)
Glucose-Capillary: 172 mg/dL — ABNORMAL HIGH (ref 65–99)

## 2017-01-29 LAB — URINE CULTURE: Culture: >=100000 — AB

## 2017-01-29 MED ORDER — CHLORPHENIRAMINE MALEATE 4 MG PO TABS
4.0000 mg | ORAL_TABLET | Freq: Four times a day (QID) | ORAL | Status: DC | PRN
  Filled 2017-01-29: qty 1

## 2017-01-29 MED ORDER — PSEUDOEPHEDRINE HCL 30 MG PO TABS
30.0000 mg | ORAL_TABLET | Freq: Four times a day (QID) | ORAL | Status: DC | PRN
  Filled 2017-01-29: qty 1

## 2017-01-29 MED ORDER — BROMPHENIRAMINE-PSEUDOEPH 1-15 MG/5ML PO ELIX
5.0000 mL | ORAL_SOLUTION | Freq: Four times a day (QID) | ORAL | Status: DC | PRN
  Filled 2017-01-29: qty 5

## 2017-01-29 NOTE — Progress Notes (Signed)
Patient ID: [name], male   DOB: 1966-01-28, 51 y.o.   MRN: [id number]  [facility] Surgery Progress Note  16 Days Post-Op  Subjective: CC- nonverbal Sitting up in bed. Shakes head "no" when asked if he is in any pain. Per nurse patient had persistent cough yesterday. Nonproductive. Seen by RT who did not feel that it was related to TF/aspiration. Robitussin did not help. Night team thought that cough was decreasing. Urine significantly clearer.  Objective: Vital signs in last 24 hours: Temp:  [97.7 F (36.5 C)-98.6 F (37 C)] 98.6 F (37 C) (03/30 0400) Pulse Rate:  [82-94] 90 (03/30 0400) Resp:  [16-20] 18 (03/30 0400) BP: (101-123)/(61-98) 115/65 (03/30 0400) SpO2:  [94 %-99 %] 99 % (03/30 0400) Weight:  [140 lb 6.4 oz (63.7 kg)] 140 lb 6.4 oz (63.7 kg) (03/30 0435) Last BM Date: 01/28/17  Intake/Output from previous day: 03/29 0701 - 03/30 0700 In: 4830 [I.V.:2210; NG/GT:2420; IV Piggyback:200] Out: 3000 [Urine:3000] Intake/Output this shift: No intake/output data recorded.  PE: Gen:  Alert, NAD, cooperative Card:  RRR, no M/G/R heard Pulm:  CTAB, no W/R/R, effort normal Abd: Soft, NT/ND, +BS Ext:  BUE in mittens Neuro: awake, spontaneous movement BUE  Lab Results:   Recent Labs  01/28/17 0503  WBC 8.7  HGB 8.6*  HCT 26.2*  PLT 413*   BMET  Recent Labs  01/28/17 0503  NA 142  K 3.9  CL 106  CO2 28  GLUCOSE 166*  BUN 16  CREATININE 0.45*  CALCIUM 8.4*   PT/INR No results for input(s): LABPROT, INR in the last 72 hours. CMP     Component Value Date/Time   NA 142 01/28/2017 0503   K 3.9 01/28/2017 0503   CL 106 01/28/2017 0503   CO2 28 01/28/2017 0503   GLUCOSE 166 (H) 01/28/2017 0503   BUN 16 01/28/2017 0503   CREATININE 0.45 (L) 01/28/2017 0503   CREATININE 0.71 06/02/2016 1232   CALCIUM 8.4 (L) 01/28/2017 0503   PROT 7.6 01/10/2017 1022   ALBUMIN 4.2 01/10/2017 1022   AST 37 01/10/2017 1022   ALT 24 01/10/2017  1022   ALKPHOS 45 01/10/2017 1022   BILITOT 0.9 01/10/2017 1022   GFRNONAA >60 01/28/2017 0503   GFRAA >60 01/28/2017 0503   Lipase  No results found for: LIPASE     Studies/Results: No results found.  Anti-infectives: Anti-infectives    Start     Dose/Rate Route Frequency Ordered Stop   01/28/17 0930  ciprofloxacin (CIPRO) IVPB 400 mg     400 mg 200 mL/hr over 60 Minutes Intravenous Every 12 hours 01/28/17 0926 02/02/17 0929   01/18/17 0845  ceFAZolin (ANCEF) IVPB 1 g/50 mL premix     1 g 100 mL/hr over 30 Minutes Intravenous Every 8 hours 01/18/17 0843 01/22/17 2217   01/13/17 1830  piperacillin-tazobactam (ZOSYN) IVPB 3.375 g  Status:  Discontinued     3.375 g 12.5 mL/hr over 240 Minutes Intravenous Every 8 hours 01/13/17 1016 01/18/17 0843   01/13/17 1030  piperacillin-tazobactam (ZOSYN) IVPB 3.375 g     3.375 g 100 mL/hr over 30 Minutes Intravenous  Once 01/13/17 1016 01/13/17 1125   01/10/17 1600  metroNIDAZOLE (FLAGYL) IVPB 500 mg     500 mg 100 mL/hr over 60 Minutes Intravenous  Once 01/10/17 1518 01/10/17 1629   01/10/17 1045  cefTRIAXone (ROCEPHIN) 1 g in dextrose 5 % 50 mL IVPB     1 g 100 mL/hr over  30 Minutes Intravenous  Once 01/10/17 1035 01/10/17 1248   01/10/17 1045  azithromycin (ZITHROMAX) 500 mg in dextrose 5 % 250 mL IVPB     500 mg 250 mL/hr over 60 Minutes Intravenous  Once 01/10/17 1035 01/10/17 1353   01/10/17 1045  metroNIDAZOLE (FLAGYL) IVPB 500 mg  Status:  Discontinued     500 mg 100 mL/hr over 60 Minutes Intravenous  Once 01/10/17 1035 01/10/17 1517       Assessment/Plan Fall TBI/L SDH/B ICC/IVH- S/P decompressive craniectomy 3/15 by Dr. Franky Macho, Dr. Yetta Barre following, on Keppra. CT head 3/27 reviewed and shows improvement, TBI team therapies. UTI - urine culture growing E coli, on Cipro IV day #2 (cannot give through tube) DM/Hyperglycemia- SSI, Lantus 15u BID Cough - may be improving. Lungs CTAB. Continue to monitor. Robitussin or  dimetapp PRN. Continue protonix. t/c CXR if not improving.  FEN- NPO, TF per [employer] at 50mL/hr ABL anemia - Hg 8.6 (3/29), recheck tomorrow VTE- SQ heparin  DIspo- CIR   LOS: 19 days    Edson Snowball , [facility] Surgery 01/29/2017, 8:20 AM Pager: [phone] Consults: [phone] Mon-Fri 7:00 am-4:30 pm Sat-Sun 7:00 am-11:30 am

## 2017-01-29 NOTE — Progress Notes (Signed)
  Speech Language Pathology Treatment: Cognitive-Linquistic  Patient Details Name: Shemuel Harkleroad MRN: [id number] DOB: 1966-04-24 Today's Date: 01/29/2017 Time: 4098-1191 SLP Time Calculation (min) (ACUTE ONLY): 52 min  Assessment / Plan / Recommendation Clinical Impression  Pt seen for co-tx with PT, with SLP focus on cognitive goals. He remains a Rancho level III (localized response), following commands ~30% of the time. He focused his attention to familiar pictures with brief track, with eye gaze harder to sustain in his right visual field. Throughout session he was exploring his environment with both hands, grabbing at tubes, catheter, and his gown. Continue to recommend CIR.   HPI HPI: Pt is a 51 y.o. male admitted to ED on 01/10/17 after being found down from a supposed fall. CT 3/11 shows R frontal intraparenchymal hemorrhage, R SDH, SAH, and midline shift; non-displaced L temporal bone fx; L mastoid bone fx. CT spine clear. CT 3/14 shows additional L temporal hemorrhagic contusion. S/p frontotemporal parietal craniotomy with bone flab in abdomen 3/15. CXR 3/18 shows hypoinflated lungs with potential infection. Intubated 3/11-3/20.       SLP Plan  Continue with current plan of care       Recommendations  Diet recommendations: NPO Medication Administration: Via alternative means                Oral Care Recommendations: Oral care QID Follow up Recommendations: Inpatient Rehab SLP Visit Diagnosis: Cognitive communication deficit (Y78.295) Plan: Continue with current plan of care       GO                Maxcine Ham 01/29/2017, 3:05 PM  Maxcine Ham, M.A. CCC-SLP [phone]

## 2017-01-29 NOTE — Progress Notes (Signed)
Case Management Note  Patient Details  Name: [name] MRN: [id number] Date of Birth: [birth year]  Subjective/Objective:  Pt admitted on 01/10/17 s/p presumed fall with SDH/IPH and mastoid fracture in temporal bone.  PTA, pt independent of ADLS.                    Action/Plan: Pt remains intubated currently.  Will follow for discharge planning as pt progresses.    Expected Discharge Date:                         Expected Discharge Plan:     In-House Referral:  Clinical Social Work  Discharge planning Services  CM Consult  Post Acute Care Choice:    Choice offered to:     DME Arranged:    DME Agency:     HH Arranged:    HH Agency:     Status of Service:  In process, will continue to follow  If discussed at Long Length of Stay Meetings, dates discussed:    Additional Comments:  01/19/17 J. Troi Bechtold, RN, BSN Pt extubated successfully today.  He is following commands well, per report.  Recommend PT/OT consults when able to tolerate therapies.   01/29/17 J. Percilla Tweten, RN, BSN Pt's sister, [name], provided with letter for pt's employer stating dates of hospitalization.  Pt remains at [facility] III level, and unable to participate fully with therapies.  May need to pursue SNF if not progressing.  CSW aware.    Glennon Mac, RN 01/12/2017, 4:38 PM

## 2017-01-29 NOTE — Progress Notes (Signed)
qPhysical Therapy Treatment Patient Details Name: [name] MRN: [id number] DOB: 1965/12/18 Today's Date: 01/29/2017    History of Present Illness pt was found down on 3/11 after a fall on stairs the evening before.  Pt sustained R Frontal Intraparenchymal Hemorrhage, R SDH, and SAH with midline shift.  Pt also sustained non-displaced L Temporal Bone fx and L Mastoid fx.  Follow-up CT on 3/14 showed additional L Temporal Hemorrhagic Contusion.  Pt not protecting airway and then intubated once in ICU on 3/11.  On 3/12 pt underwent burr hole with placement of ICP monitor.  Pt is now s/p Frontotemporal Parietal Craniotomy with bone flap removed and placed in abdomen on 3/14.  pt with hx of DM.      PT Comments    Pt progressing towards physical therapy goals. Pt seen with SLP for TBI team interventions. This session, pt demonstrates behaviors consistent with Rancho III (localized response). Upon first waking, pt waving at therapist and verbally responding - asking "how are you". Was not verbal for the rest of the session. He continues to follow one step commands ~30% of the time. Sister provided pictures and recognized himself and his sister in the pictures (nodded his head yes when asked "Is this [name]?" and pointed at sister when therapist pointed to her in the picture). Will continue to follow.   Follow Up Recommendations  CIR     Equipment Recommendations  None recommended by PT    Recommendations for Other Services Rehab consult;OT consult     Precautions / Restrictions Precautions Precautions: Fall Precaution Comments: Bone flap removed on R; NG tube Restrictions Weight Bearing Restrictions: No    Mobility  Bed Mobility Overal bed mobility: Needs Assistance Bed Mobility: Supine to Sit;Sit to Supine     Supine to sit: Total assist;+2 for physical assistance Sit to supine: Total assist;+2 for physical assistance   General bed mobility comments: Pt did not attempt  to assist   Transfers                 General transfer comment: Not able to attempt this session  Ambulation/Gait                 Stairs            Wheelchair Mobility    Modified Rankin (Stroke Patients Only)       Balance Overall balance assessment: Needs assistance Sitting-balance support: Feet supported Sitting balance-Leahy Scale: Poor Sitting balance - Comments: Heavy mod to max assist required for sitting EOB. Holding head more upright this session for short bouts with inability to maintain.  Postural control: Posterior lean                                  Cognition Arousal/Alertness: Lethargic Behavior During Therapy: Flat affect Overall Cognitive Status: Impaired/Different from baseline Area of Impairment: Attention;Following commands Auditory: Localization to Sound Visual: Visual Startle Motor: Automatic Motor Response Oromotor/Verbal: Vocalization/Oral Movement Communication: None Arousal: Eye opening without stimulation Total Score: 12 Rancho Levels of Cognitive Functioning Rancho Mirant Scales of Cognitive Functioning: Localized response Orientation Level: Disoriented to;Place;Time;Situation Current Attention Level: Focused   Following Commands: Follows one step commands inconsistently;Follows one step commands with increased time   Awareness: Intellectual Problem Solving: Slow processing;Decreased initiation;Requires verbal cues;Requires tactile cues General Comments: Multimodal cues utilized for attention to task today. Tracking therapist and objects L>R. Grabbed washcloth and wiped his  mouth when cued that his mouth was wet.       Exercises      General Comments        Pertinent Vitals/Pain Pain Assessment: Faces Faces Pain Scale: No hurt    Home Living                      Prior Function            PT Goals (current goals can now be found in the care plan section) Acute Rehab PT  Goals Patient Stated Goal: Per sister for pt to be able to return to her home. PT Goal Formulation: Patient unable to participate in goal setting Time For Goal Achievement: 02/03/17 Progress towards PT goals: Progressing toward goals    Frequency    Min 3X/week      PT Plan Current plan remains appropriate    Co-evaluation PT/OT/SLP Co-Evaluation/Treatment: Yes Reason for Co-Treatment: Complexity of the patient's impairments (multi-system involvement);Necessary to address cognition/behavior during functional activity;For patient/therapist safety PT goals addressed during session: Mobility/safety with mobility;Balance       End of Session Equipment Utilized During Treatment: Oxygen Activity Tolerance: Patient limited by lethargy Patient left: in bed;with call bell/phone within reach;with family/visitor present;with bed alarm set Nurse Communication: Mobility status PT Visit Diagnosis: Muscle weakness (generalized) (M62.81)     Time: 9604-5409 PT Time Calculation (min) (ACUTE ONLY): 52 min  Charges:  $Therapeutic Activity: 23-37 mins                    G Codes:       Conni Slipper, PT, DPT Acute Rehabilitation Services Pager: [phone]    Marylynn Pearson 01/29/2017, 2:57 PM

## 2017-01-29 NOTE — Progress Notes (Signed)
Rehab admissions - Patient not at a level yet where he can tolerate and participate fully with therapies.  Will follow up on Monday for progress.  Not ready for acute inpatient rehab yet.  #[phone]

## 2017-01-30 LAB — CBC
HCT: 28.5 % — ABNORMAL LOW (ref 39.0–52.0)
Hemoglobin: 9.1 g/dL — ABNORMAL LOW (ref 13.0–17.0)
MCH: 27.7 pg (ref 26.0–34.0)
MCHC: 31.9 g/dL (ref 30.0–36.0)
MCV: 86.9 fL (ref 78.0–100.0)
Platelets: 454 10*3/uL — ABNORMAL HIGH (ref 150–400)
RBC: 3.28 MIL/uL — ABNORMAL LOW (ref 4.22–5.81)
RDW: 13 % (ref 11.5–15.5)
WBC: 7.7 10*3/uL (ref 4.0–10.5)

## 2017-01-30 LAB — BASIC METABOLIC PANEL
ANION GAP: 7 (ref 5–15)
BUN: 16 mg/dL (ref 6–20)
CALCIUM: 8.4 mg/dL — AB (ref 8.9–10.3)
CO2: 27 mmol/L (ref 22–32)
CREATININE: 0.45 mg/dL — AB (ref 0.61–1.24)
Chloride: 106 mmol/L (ref 101–111)
Glucose, Bld: 140 mg/dL — ABNORMAL HIGH (ref 65–99)
Potassium: 4 mmol/L (ref 3.5–5.1)
SODIUM: 140 mmol/L (ref 135–145)

## 2017-01-30 LAB — GLUCOSE, CAPILLARY
GLUCOSE-CAPILLARY: 139 mg/dL — AB (ref 65–99)
GLUCOSE-CAPILLARY: 189 mg/dL — AB (ref 65–99)
GLUCOSE-CAPILLARY: 119 mg/dL — AB (ref 65–99)
Glucose-Capillary: 105 mg/dL — ABNORMAL HIGH (ref 65–99)
Glucose-Capillary: 178 mg/dL — ABNORMAL HIGH (ref 65–99)

## 2017-01-30 MED ORDER — LEVETIRACETAM 100 MG/ML PO SOLN
750.0000 mg | Freq: Two times a day (BID) | ORAL | Status: DC
  Administered 2017-01-30 – 2017-02-01 (×5): 750 mg via ORAL
  Filled 2017-01-30 (×6): qty 7.5

## 2017-01-30 NOTE — Progress Notes (Signed)
Patient ID: [name], male   DOB: 10/27/1966, 51 y.o.   MRN: [id number]  [facility] Surgery Progress Note  17 Days Post-Op  Subjective: No complaints, non-verbal. Following one step commands with PT ~30% of the time.  Per RN cough responding well to robitussin. He has not been coughing this morning.  Objective: Vital signs in last 24 hours: Temp:  [97.5 F (36.4 C)-98.8 F (37.1 C)] 97.9 F (36.6 C) (03/31 0512) Pulse Rate:  [66-96] 86 (03/31 0512) Resp:  [18-19] 18 (03/31 0512) BP: (97-117)/(61-77) 101/64 (03/31 0512) SpO2:  [97 %-100 %] 100 % (03/31 0512) Weight:  [141 lb 9.6 oz (64.2 kg)] 141 lb 9.6 oz (64.2 kg) (03/31 0458) Last BM Date: 01/30/17  Intake/Output from previous day: 03/30 0701 - 03/31 0700 In: 495 [NG/GT:495] Out: 1540 [Urine:1540] Intake/Output this shift: No intake/output data recorded.  PE: Gen:  Alert, NAD, cooperative Card:  RRR, no M/G/R heard Pulm:  CTAB, no W/R/R, effort normal Abd: Soft, NT/ND, +BS Ext:  BUE in mittens Neuro: awake, spontaneous movement BUE  Lab Results:   Recent Labs  01/28/17 0503 01/30/17 0426  WBC 8.7 7.7  HGB 8.6* 9.1*  HCT 26.2* 28.5*  PLT 413* 454*   BMET  Recent Labs  01/28/17 0503 01/30/17 0426  NA 142 140  K 3.9 4.0  CL 106 106  CO2 28 27  GLUCOSE 166* 140*  BUN 16 16  CREATININE 0.45* 0.45*  CALCIUM 8.4* 8.4*   PT/INR No results for input(s): LABPROT, INR in the last 72 hours. CMP     Component Value Date/Time   NA 140 01/30/2017 0426   K 4.0 01/30/2017 0426   CL 106 01/30/2017 0426   CO2 27 01/30/2017 0426   GLUCOSE 140 (H) 01/30/2017 0426   BUN 16 01/30/2017 0426   CREATININE 0.45 (L) 01/30/2017 0426   CREATININE 0.71 06/02/2016 1232   CALCIUM 8.4 (L) 01/30/2017 0426   PROT 7.6 01/10/2017 1022   ALBUMIN 4.2 01/10/2017 1022   AST 37 01/10/2017 1022   ALT 24 01/10/2017 1022   ALKPHOS 45 01/10/2017 1022   BILITOT 0.9 01/10/2017 1022   GFRNONAA >60 01/30/2017  0426   GFRAA >60 01/30/2017 0426   Lipase  No results found for: LIPASE     Studies/Results: No results found.  Anti-infectives: Anti-infectives    Start     Dose/Rate Route Frequency Ordered Stop   01/28/17 0930  ciprofloxacin (CIPRO) IVPB 400 mg     400 mg 200 mL/hr over 60 Minutes Intravenous Every 12 hours 01/28/17 0926 02/02/17 0929   01/18/17 0845  ceFAZolin (ANCEF) IVPB 1 g/50 mL premix     1 g 100 mL/hr over 30 Minutes Intravenous Every 8 hours 01/18/17 0843 01/22/17 2217   01/13/17 1830  piperacillin-tazobactam (ZOSYN) IVPB 3.375 g  Status:  Discontinued     3.375 g 12.5 mL/hr over 240 Minutes Intravenous Every 8 hours 01/13/17 1016 01/18/17 0843   01/13/17 1030  piperacillin-tazobactam (ZOSYN) IVPB 3.375 g     3.375 g 100 mL/hr over 30 Minutes Intravenous  Once 01/13/17 1016 01/13/17 1125   01/10/17 1600  metroNIDAZOLE (FLAGYL) IVPB 500 mg     500 mg 100 mL/hr over 60 Minutes Intravenous  Once 01/10/17 1518 01/10/17 1629   01/10/17 1045  cefTRIAXone (ROCEPHIN) 1 g in dextrose 5 % 50 mL IVPB     1 g 100 mL/hr over 30 Minutes Intravenous  Once 01/10/17 1035 01/10/17 1248  01/10/17 1045  azithromycin (ZITHROMAX) 500 mg in dextrose 5 % 250 mL IVPB     500 mg 250 mL/hr over 60 Minutes Intravenous  Once 01/10/17 1035 01/10/17 1353   01/10/17 1045  metroNIDAZOLE (FLAGYL) IVPB 500 mg  Status:  Discontinued     500 mg 100 mL/hr over 60 Minutes Intravenous  Once 01/10/17 1035 01/10/17 1517       Assessment/Plan Fall TBI/L SDH/B ICC/IVH- S/P decompressive craniectomy 3/15 by Dr. Franky Macho, Dr. Yetta Barre following, on Keppra. CT head 3/27 reviewed and shows improvement, TBI team therapies. UTI- urine culture growing E coli, on Cipro IV day #3  DM/Hyperglycemia- SSI, Lantus 15u BID Cough - improving, continue to monitor. Lungs CTAB. Robitussin or dimetapp PRN. Continue protonix.  FEN- NPO, TF per [employer] at 76mL/hr ABL anemia - Hg 9.1, stable VTE- SQ  heparin  DIspo- CIR, possible SNF if not progressing   LOS: 20 days    Edson Snowball , [facility] Surgery 01/30/2017, 8:26 AM Pager: [phone] Consults: [phone] Mon-Fri 7:00 am-4:30 pm Sat-Sun 7:00 am-11:30 am

## 2017-01-31 DIAGNOSIS — E876 Hypokalemia: Secondary | ICD-10-CM | POA: Diagnosis not present

## 2017-01-31 DIAGNOSIS — D62 Acute posthemorrhagic anemia: Secondary | ICD-10-CM | POA: Diagnosis not present

## 2017-01-31 DIAGNOSIS — G936 Cerebral edema: Secondary | ICD-10-CM | POA: Diagnosis present

## 2017-01-31 DIAGNOSIS — R319 Hematuria, unspecified: Secondary | ICD-10-CM | POA: Diagnosis not present

## 2017-01-31 DIAGNOSIS — N390 Urinary tract infection, site not specified: Secondary | ICD-10-CM | POA: Diagnosis not present

## 2017-01-31 DIAGNOSIS — E872 Acidosis: Secondary | ICD-10-CM | POA: Diagnosis present

## 2017-01-31 DIAGNOSIS — B9620 Unspecified Escherichia coli [E. coli] as the cause of diseases classified elsewhere: Secondary | ICD-10-CM | POA: Diagnosis not present

## 2017-01-31 DIAGNOSIS — S066X9A Traumatic subarachnoid hemorrhage with loss of consciousness of unspecified duration, initial encounter: Secondary | ICD-10-CM | POA: Diagnosis present

## 2017-01-31 DIAGNOSIS — S0219XA Other fracture of base of skull, initial encounter for closed fracture: Secondary | ICD-10-CM | POA: Diagnosis present

## 2017-01-31 DIAGNOSIS — J15211 Pneumonia due to Methicillin susceptible Staphylococcus aureus: Secondary | ICD-10-CM | POA: Diagnosis not present

## 2017-01-31 DIAGNOSIS — S065X9A Traumatic subdural hemorrhage with loss of consciousness of unspecified duration, initial encounter: Secondary | ICD-10-CM | POA: Diagnosis present

## 2017-01-31 DIAGNOSIS — W109XXA Fall (on) (from) unspecified stairs and steps, initial encounter: Secondary | ICD-10-CM | POA: Diagnosis present

## 2017-01-31 DIAGNOSIS — J9690 Respiratory failure, unspecified, unspecified whether with hypoxia or hypercapnia: Secondary | ICD-10-CM | POA: Diagnosis present

## 2017-01-31 DIAGNOSIS — Z7984 Long term (current) use of oral hypoglycemic drugs: Secondary | ICD-10-CM | POA: Diagnosis not present

## 2017-01-31 DIAGNOSIS — Z79899 Other long term (current) drug therapy: Secondary | ICD-10-CM | POA: Diagnosis not present

## 2017-01-31 DIAGNOSIS — E1165 Type 2 diabetes mellitus with hyperglycemia: Secondary | ICD-10-CM | POA: Diagnosis present

## 2017-01-31 DIAGNOSIS — I6200 Nontraumatic subdural hemorrhage, unspecified: Secondary | ICD-10-CM | POA: Diagnosis present

## 2017-01-31 DIAGNOSIS — R402432 Glasgow coma scale score 3-8, at arrival to emergency department: Secondary | ICD-10-CM | POA: Diagnosis present

## 2017-01-31 DIAGNOSIS — E871 Hypo-osmolality and hyponatremia: Secondary | ICD-10-CM | POA: Diagnosis not present

## 2017-01-31 DIAGNOSIS — Z7982 Long term (current) use of aspirin: Secondary | ICD-10-CM | POA: Diagnosis not present

## 2017-01-31 LAB — GLUCOSE, CAPILLARY
GLUCOSE-CAPILLARY: 104 mg/dL — AB (ref 65–99)
GLUCOSE-CAPILLARY: 107 mg/dL — AB (ref 65–99)
GLUCOSE-CAPILLARY: 157 mg/dL — AB (ref 65–99)
Glucose-Capillary: 136 mg/dL — ABNORMAL HIGH (ref 65–99)
Glucose-Capillary: 177 mg/dL — ABNORMAL HIGH (ref 65–99)

## 2017-01-31 NOTE — Progress Notes (Signed)
Patient ID: [name], male   DOB: [birth year], 51 y.o.   MRN: [id number]  [facility] Surgery Progress Note  18 Days Post-Op  Subjective: Awake and alert this morning. No definite complaints. Patient mumbling today, I think he said "un poco." Does not follow commands. Denies headache or abdominal pain. Per RN cough improved.  Objective: Vital signs in last 24 hours: Temp:  [97.5 F (36.4 C)-98.2 F (36.8 C)] 97.5 F (36.4 C) (04/01 0100) Pulse Rate:  [89-99] 90 (04/01 0100) Resp:  [18] 18 (04/01 0100) BP: (98-117)/(61-70) 98/61 (04/01 0100) SpO2:  [94 %-100 %] 94 % (04/01 0100) Weight:  [140 lb 4 oz (63.6 kg)] 140 lb 4 oz (63.6 kg) (04/01 0459) Last BM Date: 01/30/17  Intake/Output from previous day: 03/31 0701 - 04/01 0700 In: 10 [I.V.:10] Out: -  Intake/Output this shift: No intake/output data recorded.  PE: Gen: Alert, NAD, cooperative Card: RRR, no M/G/R heard Pulm: CTAB, no W/R/R, effort normal Abd: Soft, NT/ND, +BS Ext: BUE in mittens Neuro: awake, spontaneous movement BUE, does not FC, mumbling  Lab Results:   Recent Labs  01/30/17 0426  WBC 7.7  HGB 9.1*  HCT 28.5*  PLT 454*   BMET  Recent Labs  01/30/17 0426  NA 140  K 4.0  CL 106  CO2 27  GLUCOSE 140*  BUN 16  CREATININE 0.45*  CALCIUM 8.4*   PT/INR No results for input(s): LABPROT, INR in the last 72 hours. CMP     Component Value Date/Time   NA 140 01/30/2017 0426   K 4.0 01/30/2017 0426   CL 106 01/30/2017 0426   CO2 27 01/30/2017 0426   GLUCOSE 140 (H) 01/30/2017 0426   BUN 16 01/30/2017 0426   CREATININE 0.45 (L) 01/30/2017 0426   CREATININE 0.71 06/02/2016 1232   CALCIUM 8.4 (L) 01/30/2017 0426   PROT 7.6 01/10/2017 1022   ALBUMIN 4.2 01/10/2017 1022   AST 37 01/10/2017 1022   ALT 24 01/10/2017 1022   ALKPHOS 45 01/10/2017 1022   BILITOT 0.9 01/10/2017 1022   GFRNONAA >60 01/30/2017 0426   GFRAA >60 01/30/2017 0426   Lipase  No results found for:  LIPASE     Studies/Results: No results found.  Anti-infectives: Anti-infectives    Start     Dose/Rate Route Frequency Ordered Stop   01/28/17 0930  ciprofloxacin (CIPRO) IVPB 400 mg     400 mg 200 mL/hr over 60 Minutes Intravenous Every 12 hours 01/28/17 0926 02/02/17 0929   01/18/17 0845  ceFAZolin (ANCEF) IVPB 1 g/50 mL premix     1 g 100 mL/hr over 30 Minutes Intravenous Every 8 hours 01/18/17 0843 01/22/17 2217   01/13/17 1830  piperacillin-tazobactam (ZOSYN) IVPB 3.375 g  Status:  Discontinued     3.375 g 12.5 mL/hr over 240 Minutes Intravenous Every 8 hours 01/13/17 1016 01/18/17 0843   01/13/17 1030  piperacillin-tazobactam (ZOSYN) IVPB 3.375 g     3.375 g 100 mL/hr over 30 Minutes Intravenous  Once 01/13/17 1016 01/13/17 1125   01/10/17 1600  metroNIDAZOLE (FLAGYL) IVPB 500 mg     500 mg 100 mL/hr over 60 Minutes Intravenous  Once 01/10/17 1518 01/10/17 1629   01/10/17 1045  cefTRIAXone (ROCEPHIN) 1 g in dextrose 5 % 50 mL IVPB     1 g 100 mL/hr over 30 Minutes Intravenous  Once 01/10/17 1035 01/10/17 1248   01/10/17 1045  azithromycin (ZITHROMAX) 500 mg in dextrose 5 % 250 mL IVPB  500 mg 250 mL/hr over 60 Minutes Intravenous  Once 01/10/17 1035 01/10/17 1353   01/10/17 1045  metroNIDAZOLE (FLAGYL) IVPB 500 mg  Status:  Discontinued     500 mg 100 mL/hr over 60 Minutes Intravenous  Once 01/10/17 1035 01/10/17 1517       Assessment/Plan Fall TBI/L SDH/B ICC/IVH- S/P decompressive craniectomy 3/15 by Dr. Franky Macho, Dr. Yetta Barre following, on Keppra. CT head 3/27 reviewed and shows improvement, TBI team therapies. UTI- urine culture growing E coli, onCipro IV day #4 DM/Hyperglycemia- SSI, Lantus 15u BID Cough - improving, continue to monitor. Lungs CTAB. Robitussin PRN. Continue protonix.  FEN- NPO, TF per [employer] at 69mL/hr ABL anemia - Hg 9.1, (3/31) stable VTE- SQ heparin  DIspo- Continue therapies. CIR, possible SNF if not progressing   LOS: 21  days    Edson Snowball , [facility] Surgery 01/31/2017, 8:35 AM Pager: [phone] Consults: [phone] Mon-Fri 7:00 am-4:30 pm Sat-Sun 7:00 am-11:30 am

## 2017-02-01 ENCOUNTER — Inpatient Hospital Stay (HOSPITAL_COMMUNITY)
Admission: RE | Admit: 2017-02-01 | Discharge: 2017-03-19 | DRG: 560 | Disposition: A | Discharge status: Other Acute Inpatient Hospital | Payer: Self-pay | Source: Intra-hospital | Attending: Physical Medicine & Rehabilitation | Admitting: Physical Medicine & Rehabilitation

## 2017-02-01 DIAGNOSIS — W19XXXD Unspecified fall, subsequent encounter: Secondary | ICD-10-CM

## 2017-02-01 DIAGNOSIS — S0219XD Other fracture of base of skull, subsequent encounter for fracture with routine healing: Principal | ICD-10-CM

## 2017-02-01 DIAGNOSIS — Z781 Physical restraint status: Secondary | ICD-10-CM

## 2017-02-01 DIAGNOSIS — R1310 Dysphagia, unspecified: Secondary | ICD-10-CM

## 2017-02-01 DIAGNOSIS — R5383 Other fatigue: Secondary | ICD-10-CM

## 2017-02-01 DIAGNOSIS — B9620 Unspecified Escherichia coli [E. coli] as the cause of diseases classified elsewhere: Secondary | ICD-10-CM

## 2017-02-01 DIAGNOSIS — R4189 Other symptoms and signs involving cognitive functions and awareness: Secondary | ICD-10-CM

## 2017-02-01 DIAGNOSIS — S06304S Unspecified focal traumatic brain injury with loss of consciousness of 6 hours to 24 hours, sequela: Secondary | ICD-10-CM

## 2017-02-01 DIAGNOSIS — E1165 Type 2 diabetes mellitus with hyperglycemia: Secondary | ICD-10-CM

## 2017-02-01 DIAGNOSIS — S065X9D Traumatic subdural hemorrhage with loss of consciousness of unspecified duration, subsequent encounter: Secondary | ICD-10-CM

## 2017-02-01 DIAGNOSIS — Z791 Long term (current) use of non-steroidal anti-inflammatories (NSAID): Secondary | ICD-10-CM

## 2017-02-01 DIAGNOSIS — I10 Essential (primary) hypertension: Secondary | ICD-10-CM

## 2017-02-01 DIAGNOSIS — Z79899 Other long term (current) drug therapy: Secondary | ICD-10-CM

## 2017-02-01 DIAGNOSIS — Z7984 Long term (current) use of oral hypoglycemic drugs: Secondary | ICD-10-CM

## 2017-02-01 DIAGNOSIS — S0990XA Unspecified injury of head, initial encounter: Secondary | ICD-10-CM

## 2017-02-01 DIAGNOSIS — S062X3S Diffuse traumatic brain injury with loss of consciousness of 1 hour to 5 hours 59 minutes, sequela: Secondary | ICD-10-CM

## 2017-02-01 DIAGNOSIS — R0902 Hypoxemia: Secondary | ICD-10-CM

## 2017-02-01 DIAGNOSIS — S06349D Traumatic hemorrhage of right cerebrum with loss of consciousness of unspecified duration, subsequent encounter: Secondary | ICD-10-CM

## 2017-02-01 DIAGNOSIS — R339 Retention of urine, unspecified: Secondary | ICD-10-CM

## 2017-02-01 DIAGNOSIS — K5900 Constipation, unspecified: Secondary | ICD-10-CM

## 2017-02-01 DIAGNOSIS — R1312 Dysphagia, oropharyngeal phase: Secondary | ICD-10-CM

## 2017-02-01 DIAGNOSIS — N390 Urinary tract infection, site not specified: Secondary | ICD-10-CM

## 2017-02-01 DIAGNOSIS — E1142 Type 2 diabetes mellitus with diabetic polyneuropathy: Secondary | ICD-10-CM

## 2017-02-01 DIAGNOSIS — R52 Pain, unspecified: Secondary | ICD-10-CM

## 2017-02-01 DIAGNOSIS — S066X9D Traumatic subarachnoid hemorrhage with loss of consciousness of unspecified duration, subsequent encounter: Secondary | ICD-10-CM

## 2017-02-01 DIAGNOSIS — R4701 Aphasia: Secondary | ICD-10-CM

## 2017-02-01 LAB — CBC
HEMATOCRIT: 28.5 % — AB (ref 39.0–52.0)
Hemoglobin: 9.3 g/dL — ABNORMAL LOW (ref 13.0–17.0)
MCH: 28.2 pg (ref 26.0–34.0)
MCHC: 32.6 g/dL (ref 30.0–36.0)
MCV: 86.4 fL (ref 78.0–100.0)
PLATELETS: 393 10*3/uL (ref 150–400)
RBC: 3.3 MIL/uL — ABNORMAL LOW (ref 4.22–5.81)
RDW: 13 % (ref 11.5–15.5)
WBC: 7.9 10*3/uL (ref 4.0–10.5)

## 2017-02-01 LAB — CREATININE, SERUM
Creatinine, Ser: 0.44 mg/dL — ABNORMAL LOW (ref 0.61–1.24)
GFR calc Af Amer: >60 mL/min (ref >60)
GFR calc non Af Amer: >60 mL/min (ref >60)

## 2017-02-01 LAB — GLUCOSE, CAPILLARY
GLUCOSE-CAPILLARY: 113 mg/dL — AB (ref 65–99)
GLUCOSE-CAPILLARY: 190 mg/dL — AB (ref 65–99)
Glucose-Capillary: 133 mg/dL — ABNORMAL HIGH (ref 65–99)
Glucose-Capillary: 144 mg/dL — ABNORMAL HIGH (ref 65–99)
Glucose-Capillary: 120 mg/dL — ABNORMAL HIGH (ref 65–99)

## 2017-02-01 MED ORDER — SODIUM CHLORIDE 0.9 % IV SOLN
INTRAVENOUS | Status: DC
  Administered 2017-02-02 – 2017-02-04 (×4): via INTRAVENOUS

## 2017-02-01 MED ORDER — SENNA 8.6 MG PO TABS: 1.0000 | ORAL_TABLET | Freq: Every day | ORAL | 0 refills | Status: DC

## 2017-02-01 MED ORDER — ACETAMINOPHEN 160 MG/5ML PO SOLN: 650.0000 mg | ORAL | 0 refills | Status: DC | PRN

## 2017-02-01 MED ORDER — SODIUM CHLORIDE 0.9% FLUSH
10.0000 mL | INTRAVENOUS | Status: DC | PRN
  Administered 2017-02-02 – 2017-02-04 (×3): 10 mL
  Administered 2017-02-08: 20 mL
  Filled 2017-02-01 (×4): qty 40

## 2017-02-01 MED ORDER — DOCUSATE SODIUM 50 MG/5ML PO LIQD: 100.0000 mg | Freq: Every day | ORAL | 0 refills | Status: DC

## 2017-02-01 MED ORDER — HEPARIN SODIUM (PORCINE) 5000 UNIT/ML IJ SOLN: 5000.0000 [IU] | Freq: Three times a day (TID) | INTRAMUSCULAR | Status: DC

## 2017-02-01 MED ORDER — LEVETIRACETAM 100 MG/ML PO SOLN: 750.0000 mg | Freq: Two times a day (BID) | ORAL | 12 refills | Status: DC

## 2017-02-01 MED ORDER — PSEUDOEPHEDRINE HCL 30 MG PO TABS: 30.0000 mg | ORAL_TABLET | Freq: Four times a day (QID) | ORAL | 0 refills | Status: DC | PRN

## 2017-02-01 MED ORDER — PRO-STAT SUGAR FREE PO LIQD
30.0000 mL | Freq: Two times a day (BID) | ORAL | Status: DC
  Administered 2017-02-01 – 2017-02-09 (×15): 30 mL
  Filled 2017-02-01 (×16): qty 30

## 2017-02-01 MED ORDER — HYDRALAZINE HCL 20 MG/ML IJ SOLN: 10.0000 mg | INTRAMUSCULAR | Status: DC | PRN

## 2017-02-01 MED ORDER — SENNA 8.6 MG PO TABS
1.0000 | ORAL_TABLET | Freq: Every day | ORAL | Status: DC
  Administered 2017-02-02 – 2017-02-09 (×5): 8.6 mg
  Filled 2017-02-01 (×7): qty 1

## 2017-02-01 MED ORDER — GUAIFENESIN 100 MG/5ML PO SOLN: 10.0000 mL | ORAL | 0 refills | Status: DC | PRN

## 2017-02-01 MED ORDER — GLUCERNA 1.2 CAL PO LIQD: 1000.0000 mL | ORAL | Status: DC

## 2017-02-01 MED ORDER — INSULIN ASPART 100 UNIT/ML ~~LOC~~ SOLN: 0.0000 [IU] | SUBCUTANEOUS | 11 refills | Status: DC

## 2017-02-01 MED ORDER — ADULT MULTIVITAMIN LIQUID CH
15.0000 mL | Freq: Every day | ORAL | Status: DC
  Administered 2017-02-02 – 2017-02-09 (×7): 15 mL
  Filled 2017-02-01 (×9): qty 15

## 2017-02-01 MED ORDER — PRO-STAT SUGAR FREE PO LIQD
30.0000 mL | Freq: Two times a day (BID) | ORAL | 0 refills | Status: DC
Start: 2017-02-01 — End: 2017-03-27

## 2017-02-01 MED ORDER — INSULIN GLARGINE 100 UNIT/ML ~~LOC~~ SOLN: 15.0000 [IU] | Freq: Two times a day (BID) | SUBCUTANEOUS | 11 refills | Status: DC

## 2017-02-01 MED ORDER — HEPARIN SODIUM (PORCINE) 5000 UNIT/ML IJ SOLN
5000.0000 [IU] | Freq: Three times a day (TID) | INTRAMUSCULAR | Status: DC
Start: 2017-02-01 — End: 2017-03-19
  Administered 2017-02-01 – 2017-03-18 (×134): 5000 [IU] via SUBCUTANEOUS
  Filled 2017-02-01 (×134): qty 1

## 2017-02-01 MED ORDER — HYDROCODONE-ACETAMINOPHEN 7.5-325 MG/15ML PO SOLN: 10.0000 mL | ORAL | 0 refills | Status: DC | PRN

## 2017-02-01 MED ORDER — CIPROFLOXACIN IN D5W 400 MG/200ML IV SOLN: 400.0000 mg | Freq: Two times a day (BID) | INTRAVENOUS | Status: DC

## 2017-02-01 MED ORDER — SODIUM CHLORIDE 0.9% FLUSH: 10.0000 mL | INTRAVENOUS | Status: DC | PRN

## 2017-02-01 MED ORDER — MAGNESIUM HYDROXIDE 400 MG/5ML PO SUSP
30.0000 mL | Freq: Every day | ORAL | 0 refills | Status: DC
Start: 2017-02-02 — End: 2017-03-27

## 2017-02-01 MED ORDER — LEVETIRACETAM 100 MG/ML PO SOLN
750.0000 mg | Freq: Two times a day (BID) | ORAL | Status: DC
  Administered 2017-02-01 – 2017-02-09 (×15): 750 mg
  Filled 2017-02-01 (×17): qty 10

## 2017-02-01 MED ORDER — HYDROCODONE-ACETAMINOPHEN 7.5-325 MG/15ML PO SOLN
10.0000 mL | ORAL | Status: DC | PRN
  Administered 2017-02-02 – 2017-02-03 (×3): 10 mL
  Filled 2017-02-01 (×3): qty 15

## 2017-02-01 MED ORDER — ACETAMINOPHEN 160 MG/5ML PO SOLN: 650.0000 mg | ORAL | Status: DC | PRN

## 2017-02-01 MED ORDER — BETHANECHOL CHLORIDE 10 MG PO TABS
10.0000 mg | ORAL_TABLET | Freq: Three times a day (TID) | ORAL | Status: DC
  Administered 2017-02-01: 10 mg
  Filled 2017-02-01 (×2): qty 1

## 2017-02-01 MED ORDER — PANTOPRAZOLE SODIUM 40 MG PO PACK: 40.0000 mg | PACK | Freq: Every day | ORAL | Status: DC

## 2017-02-01 MED ORDER — CIPROFLOXACIN IN D5W 400 MG/200ML IV SOLN
400.0000 mg | Freq: Two times a day (BID) | INTRAVENOUS | Status: AC
  Administered 2017-02-02: 400 mg via INTRAVENOUS
  Filled 2017-02-01: qty 200

## 2017-02-01 MED ORDER — IPRATROPIUM-ALBUTEROL 0.5-2.5 (3) MG/3ML IN SOLN: 3.0000 mL | Freq: Four times a day (QID) | RESPIRATORY_TRACT | Status: DC | PRN

## 2017-02-01 MED ORDER — GLUCERNA 1.2 CAL PO LIQD
1000.0000 mL | ORAL | Status: DC
  Administered 2017-02-01: 1000 mL
  Filled 2017-02-01 (×9): qty 1000

## 2017-02-01 MED ORDER — LORAZEPAM 2 MG/ML IJ SOLN
1.0000 mg | INTRAMUSCULAR | Status: DC | PRN
  Administered 2017-02-02: 1 mg via INTRAVENOUS
  Filled 2017-02-01: qty 1

## 2017-02-01 MED ORDER — BETHANECHOL CHLORIDE 10 MG PO TABS: 10.0000 mg | ORAL_TABLET | Freq: Three times a day (TID) | ORAL | Status: DC

## 2017-02-01 MED ORDER — INSULIN GLARGINE 100 UNIT/ML ~~LOC~~ SOLN
15.0000 [IU] | Freq: Two times a day (BID) | SUBCUTANEOUS | Status: DC
  Administered 2017-02-01 – 2017-02-09 (×16): 15 [IU] via SUBCUTANEOUS
  Filled 2017-02-01 (×20): qty 0.15

## 2017-02-01 MED ORDER — DOCUSATE SODIUM 50 MG/5ML PO LIQD
100.0000 mg | Freq: Every day | ORAL | Status: DC
  Administered 2017-02-02 – 2017-02-09 (×5): 100 mg
  Filled 2017-02-01 (×7): qty 10

## 2017-02-01 MED ORDER — ORAL CARE MOUTH RINSE: 15.0000 mL | Freq: Two times a day (BID) | OROMUCOSAL | 0 refills | Status: DC

## 2017-02-01 MED ORDER — CHLORPHENIRAMINE MALEATE 4 MG PO TABS: 4.0000 mg | ORAL_TABLET | Freq: Four times a day (QID) | ORAL | 0 refills | Status: DC | PRN

## 2017-02-01 MED ORDER — LORAZEPAM 2 MG/ML IJ SOLN: 0.5000 mg | INTRAMUSCULAR | 0 refills | Status: DC | PRN

## 2017-02-01 MED ORDER — PANTOPRAZOLE SODIUM 40 MG PO PACK
40.0000 mg | PACK | Freq: Every day | ORAL | Status: DC
  Administered 2017-02-02 – 2017-02-09 (×7): 40 mg
  Filled 2017-02-01 (×6): qty 20

## 2017-02-01 MED ORDER — NALOXONE HCL 0.4 MG/ML IJ SOLN: 0.0800 mg | INTRAMUSCULAR | Status: DC | PRN

## 2017-02-01 MED ORDER — INSULIN ASPART 100 UNIT/ML ~~LOC~~ SOLN
0.0000 [IU] | SUBCUTANEOUS | Status: DC
  Administered 2017-02-02 (×2): 2 [IU] via SUBCUTANEOUS
  Administered 2017-02-02: 3 [IU] via SUBCUTANEOUS
  Administered 2017-02-02: 2 [IU] via SUBCUTANEOUS
  Administered 2017-02-02: 5 [IU] via SUBCUTANEOUS
  Administered 2017-02-02 – 2017-02-03 (×3): 2 [IU] via SUBCUTANEOUS
  Administered 2017-02-03: 3 [IU] via SUBCUTANEOUS
  Administered 2017-02-03 – 2017-02-05 (×4): 2 [IU] via SUBCUTANEOUS
  Administered 2017-02-06: 3 [IU] via SUBCUTANEOUS
  Administered 2017-02-06 – 2017-02-07 (×3): 2 [IU] via SUBCUTANEOUS
  Administered 2017-02-08: 3 [IU] via SUBCUTANEOUS
  Administered 2017-02-08 (×3): 2 [IU] via SUBCUTANEOUS
  Administered 2017-02-09: 3 [IU] via SUBCUTANEOUS
  Administered 2017-02-09: 2 [IU] via SUBCUTANEOUS
  Administered 2017-02-09: 3 [IU] via SUBCUTANEOUS

## 2017-02-01 MED ORDER — LABETALOL HCL 5 MG/ML IV SOLN: 10.0000 mg | INTRAVENOUS | Status: DC | PRN

## 2017-02-01 MED ORDER — ONDANSETRON HCL 4 MG/2ML IJ SOLN: 4.0000 mg | INTRAMUSCULAR | 0 refills | Status: DC | PRN

## 2017-02-01 MED ORDER — ADULT MULTIVITAMIN LIQUID CH: 15.0000 mL | Freq: Every day | ORAL | Status: DC

## 2017-02-01 NOTE — Progress Notes (Signed)
Report given to [facility] in inpatient rehab.  Pt has no s/s of any acute distress.

## 2017-02-01 NOTE — Progress Notes (Signed)
  Speech Language Pathology Treatment: Dysphagia;Cognitive-Linquistic  Patient Details Name: [name] MRN: [id number] DOB: December 13, 1965 Today's Date: 02/01/2017 Time: 1320-1406 SLP Time Calculation (min) (ACUTE ONLY): 46 min  Assessment / Plan / Recommendation Clinical Impression  Pt presents more as a Rancho level IV (confused, agitated) but without signs of agitation. He is restless and pulling at lines/tubes, with increase in frequency as session continues. He had intermittent verbal output, mostly at the word level and always in Spanish. He was intermittently following commands when given in Spanish but also occasionally when given in [place]. He showed brief, but purposeful sustained attention to self-fed PO trials. Minimal intake of thin liquids and pureed boluses were observed with anterior loss and impulsive rate observed, requiring Max cues. No overt s/s of aspiration were noted, but given cognitive impairment and signs of significant oral dysphagia noted, would continue NPO pending additional diagnostic trials with SLP. Note that pt is to go to CIR today - he will benefit from intensive SLP f/u.    HPI HPI: Pt is a 51 y.o. male admitted to ED on 01/10/17 after being found down from a supposed fall. CT 3/11 shows R frontal intraparenchymal hemorrhage, R SDH, SAH, and midline shift; non-displaced L temporal bone fx; L mastoid bone fx. CT spine clear. CT 3/14 shows additional L temporal hemorrhagic contusion. S/p frontotemporal parietal craniotomy with bone flab in abdomen 3/15. CXR 3/18 shows hypoinflated lungs with potential infection. Intubated 3/11-3/20.       SLP Plan  Continue with current plan of care       Recommendations  Diet recommendations: NPO Medication Administration: Via alternative means                Oral Care Recommendations: Oral care QID Follow up Recommendations: Inpatient Rehab SLP Visit Diagnosis: Cognitive communication deficit  (R41.841);Dysphagia, unspecified (R13.10) Plan: Continue with current plan of care       GO                [name] 02/01/2017, 3:35 PM  [name], M.A. CCC-SLP [phone]

## 2017-02-01 NOTE — Progress Notes (Signed)
Case Management Note  Patient Details  Name: [name] MRN: [id number] Date of Birth: [birth year]  Subjective/Objective:  Pt admitted on 01/10/17 s/p presumed fall with SDH/IPH and mastoid fracture in temporal bone.  PTA, pt independent of ADLS.                    Action/Plan: Pt remains intubated currently.  Will follow for discharge planning as pt progresses.    Expected Discharge Date:  02/01/17                       Expected Discharge Plan:  IP Rehab Facility   In-House Referral:  Clinical Social Work  Discharge planning Services  CM Consult  Post Acute Care Choice:    Choice offered to:     DME Arranged:    DME Agency:     HH Arranged:    HH Agency:     Status of Service:  Completed, will sign off  If discussed at [employer], dates discussed:    Additional Comments:  01/19/17 J. Kalem Rockwell, RN, BSN Pt extubated successfully today.  He is following commands well, per report.  Recommend PT/OT consults when able to tolerate therapies.   01/29/17 J. Monda Chastain, RN, BSN Pt's sister, [name], provided with letter for pt's employer stating dates of hospitalization.  Pt remains at [facility] III level, and unable to participate fully with therapies.  May need to pursue SNF if not progressing.  CSW aware.    02/01/17 J. Venson Ferencz, RN, BSN Pt has made good progress over the weekend; responding well and speaking.  Plan for discharge to [facility] later today.    Glennon Mac, RN 01/12/2017, 4:38 PM

## 2017-02-01 NOTE — Discharge Summary (Signed)
Physician Discharge Summary   Patient ID: [name] MRN: [id number] DOB/AGE: January 26, 1966 51 y.o.  Admit date: 01/10/2017 Discharge date: 02/01/2017  Discharge Diagnoses Patient Active Problem List   Diagnosis Date Noted  . Intraparenchymal hemorrhage of brain (HCC) 02/01/2017  . Intracerebral hemorrhage (HCC) 01/14/2017  . Fall 01/10/2017  . Subdural hemorrhage (HCC) 01/10/2017  . Diabetes type 2, uncontrolled (HCC) 06/13/2015    Consultants Dr. Marikay Alar, Neurosurgery Dr. Christia Reading, Otolaryngology  Dr. Kipp Brood, Anesthesiology Dr. Gentry Roch, Physical Medicine and rehabilitation   Procedures Dr. Coletta Memos, 01/14/2017, 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, Placement of bone flap in abdomen for right frontal temporal Intracerebral hematomas, ELEVATED ICP  HPI: Pt is a 51 yo M who was found down.  He was initially worked up as a medical admission with a low grade fever and mental status changes.  Once his family found him, more information came to light.  He was at a friend's house and was drinking.  He was assumed to be somewhere sleeping it off.  The morning they found him near the bottom of a staircase and they couldn't wake him up. He was assumed to have fallen.   He was unable to give any history. Pt was GCS 8 upon arrival to the ED but was maintaining his airway so intubation was not performed. Work up revealed intracranial hemorrhage and intraparenchymal hemorrhage, right subdural hematoma with right left midline shift, nondisplaced vertical fracture through the left temporal bone, parallel nondisplaced horizontally oriented skull fractures along the left lateral convexity tracking anteriorly to the greater wing of the left sphenoid, fracture through the left mastoid bone and air cells with fluid/blood in the left middle ear, mastoids, and EAC.   Hospital Course: Pt was intubated and admitted to the ICU. Neurosurgery was  consulted and suggested frequent neuro checks and ICP monitoring and a bolt was placed on 3/12. Tube feeds were started. ENT was consulted and they suggested no surgical intervention. On 3/14 pt was noted to have a fever and was started on empiric Zosyn. On 3/14 CT head shows increased right to left shift. On 3/15 Dr. Coletta Memos, took pt to the OR for 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, Placement of bone flap in abdomen for right frontal temporal Intracerebral hematomas. Pt was found to have staph aureus pneumonia which was treated. Also had ABL anemia but Hg remained stable. Pt was extubated on 3/20. Pt pulled corTrak on 3/22 and it was replaced. Foley was removed on 3/27. On 3/28 pt had blood in his urine but cleared the following day. UA revealed E. Coli. Pt was started on Cipro. By 4/2 pt was improving with therapies, was speaking, VSS, pain controlled, and felt stable for discharge to inpatient rehab.  Pt was discharged in good condition.  The [place] substance control database was reviewed prior to prescribing narcotic pain medication to this pt.   I did not see nor take part in this patient's care. All of the information provided in this discharge summary was taken from the pt's EMR.  Allergies as of 02/01/2017   No Known Allergies     Medication List    STOP taking these medications   aspirin EC 81 MG tablet   atorvastatin 40 MG tablet Commonly known as:  LIPITOR   glipiZIDE 5 MG tablet Commonly known as:  GLUCOTROL   Linagliptin-Metformin HCl 2.03-999 MG Tabs   lisinopril 2.5 MG tablet Commonly known as:  PRINIVIL,ZESTRIL  metFORMIN 500 MG tablet Commonly known as:  GLUCOPHAGE   naproxen 500 MG tablet Commonly known as:  NAPROSYN     TAKE these medications   acetaminophen 160 MG/5ML solution Commonly known as:  TYLENOL Place 20.3 mLs (650 mg total) into feeding tube every 4 (four) hours as needed for mild pain or fever.   bethanechol 10 MG  tablet Commonly known as:  URECHOLINE Place 1 tablet (10 mg total) into feeding tube 3 (three) times daily.   chlorpheniramine 4 MG tablet Commonly known as:  CHLOR-TRIMETON Take 1 tablet (4 mg total) by mouth every 6 (six) hours as needed (congestion, cough).   ciprofloxacin 400 MG/200ML Soln Commonly known as:  CIPRO Inject 200 mLs (400 mg total) into the vein every 12 (twelve) hours.   docusate 50 MG/5ML liquid Commonly known as:  COLACE Place 10 mLs (100 mg total) into feeding tube daily. Start taking on:  02/02/2017   feeding supplement (GLUCERNA 1.2 CAL) Liqd Place 1,000 mLs into feeding tube continuous.   feeding supplement (PRO-STAT SUGAR FREE 64) Liqd Place 30 mLs into feeding tube 2 (two) times daily.   guaiFENesin 100 MG/5ML Soln Commonly known as:  ROBITUSSIN Place 10 mLs (200 mg total) into feeding tube every 4 (four) hours as needed for cough or to loosen phlegm.   heparin 5000 UNIT/ML injection Inject 1 mL (5,000 Units total) into the skin every 8 (eight) hours.   hydrALAZINE 20 MG/ML injection Commonly known as:  APRESOLINE Inject 0.5 mLs (10 mg total) into the vein every 2 (two) hours as needed (SBP>180).   HYDROcodone-acetaminophen 7.5-325 mg/15 ml solution Commonly known as:  HYCET Place 10 mLs into feeding tube every 4 (four) hours as needed for moderate pain.   insulin aspart 100 UNIT/ML injection Commonly known as:  novoLOG Inject 0-15 Units into the skin every 4 (four) hours.   insulin glargine 100 UNIT/ML injection Commonly known as:  LANTUS Inject 0.15 mLs (15 Units total) into the skin 2 (two) times daily.   ipratropium-albuterol 0.5-2.5 (3) MG/3ML Soln Commonly known as:  DUONEB Take 3 mLs by nebulization every 6 (six) hours as needed.   labetalol 5 MG/ML injection Commonly known as:  NORMODYNE,TRANDATE Inject 2-8 mLs (10-40 mg total) into the vein every 10 (ten) minutes as needed.   levETIRAcetam 100 MG/ML solution Commonly known as:   KEPPRA Take 7.5 mLs (750 mg total) by mouth 2 (two) times daily.   LORazepam 2 MG/ML injection Commonly known as:  ATIVAN Inject 0.25 mLs (0.5 mg total) into the vein every 2 (two) hours as needed for anxiety or sedation (For procedures).   magnesium hydroxide 400 MG/5ML suspension Commonly known as:  MILK OF MAGNESIA Place 30 mLs into feeding tube daily. Start taking on:  02/02/2017   mouth rinse Liqd solution 15 mLs by Mouth Rinse route 2 times daily at 12 noon and 4 pm.   multivitamin Liqd Place 15 mLs into feeding tube daily. Start taking on:  02/02/2017   naloxone 0.4 MG/ML injection Commonly known as:  NARCAN Inject 0.2 mLs (0.08 mg total) into the vein as needed (to reverse respiratory depression.  May give every 3 minutes prn. Not to exceed 0.4 mg total dose.).   ondansetron 4 MG/2ML Soln injection Commonly known as:  ZOFRAN Inject 2 mLs (4 mg total) into the vein every 4 (four) hours as needed for nausea or vomiting.   pantoprazole sodium 40 mg/20 mL Pack Commonly known as:  PROTONIX Place 20  mLs (40 mg total) into feeding tube daily. Start taking on:  02/02/2017   pseudoephedrine 30 MG tablet Commonly known as:  SUDAFED Take 1 tablet (30 mg total) by mouth every 6 (six) hours as needed for congestion (cough).   senna 8.6 MG Tabs tablet Commonly known as:  SENOKOT Place 1 tablet (8.6 mg total) into feeding tube daily. Start taking on:  02/02/2017   sodium chloride flush 0.9 % Soln Commonly known as:  NS 10-40 mLs by Intracatheter route as needed (flush).       Follow-up Information    [facility]. Schedule an appointment as soon as possible for a visit in 2 week(s).   Contact information: [address] [place] [address] [phone]            Signed:  Mattie Marlin, [facility] Surgery Pager [phone] General Trauma PA pager [phone]   02/01/2017, 4:10 PM

## 2017-02-01 NOTE — PMR Pre-admission (Signed)
PMR Admission Coordinator Pre-Admission Assessment  Patient: [name] is an 51 y.o., male MRN: [id number] DOB: 01-Sep-1966 Height:  (154.9 cm) Weight: 65.6 kg (144 lb 9.6 oz)              Insurance Information Self pay - no insurance  Medicaid Application Date:        Case Manager:   Disability Application Date:        Case Worker:    Emergency Contact Information Contact Information    Name Relation Home Work Mobile   Carney Sister [phone]       Current Medical History  Patient Admitting Diagnosis:  Right IPH/SDH after fall   History of Present Illness: A 51 y.o.right handedlimited English-speaking malewith history of hypertension, diabetes mellitus. Per chart review patient lives with sister was independent prior to admission. Plan is for sister and brother-in-law to provide assistance as needed at time of discharge. Presented 01/10/2017 after being found down from a supposed fall. CT of the head and imaging revealed intracranial hemorrhage with a 4.5 cm right frontal intraparenchymal hemorrhage, 6 mm right subdural hematoma and other scattered areas of intraparenchymal and subarachnoid hemorrhage with right to left midline shift. Nondisplaced vertical fracture through the left temporal bone. CT cervical spine negative.CT abdomen and pelvis with no evidence of acute injury.UDS negative.neurosurgery Dr. Marikay Alar advised conservative care with ICP monitor.follow-up ENT Dr. Jenne Pane for temporal bone fracture no surgical intervention required.follow-up CT imaging showed increased right to left shift greater than 1 cm. Basal cisterns slightly effaced.Patient with steady rise in his ICP and not responsive to heavy sedation. .Underwent right frontal temporal parietal craniectomy for hematoma evacuation 01/14/2017 per Dr. Mikal Plane with placement of bone flap in the abdomen. Keppra for seizure prophylaxis.Patient was extubated 01/19/2017. Nasogastric tube in place for  nutritional support patient remains NPO. Later placed on subcutaneous heparin for DVT prophylaxis. Placed on Ancef 01/18/2017 for suspected pneumonia 5 days. Bouts of urinary retention with urecholine added as well as greater than 100,000 Escherichia coli UTI placed on Cipro 01/28/2017. Physical therapy evaluation completed with recommendations of physical medicine rehabilitation consult.  Patient to be admitted for a comprehensive inpatient rehabilitation program.   Total: 7=NIH  Past Medical History  Past Medical History:  Diagnosis Date  . Diabetes mellitus without complication (HCC)     Family History  family history includes Diabetes in his father and mother.  Prior Rehab/Hospitalizations: No previous rehab admissions.  Has the patient had major surgery during 100 days prior to admission? No  Current Medications   Current Facility-Administered Medications:  .  0.9 %  sodium chloride infusion, , Intravenous, Continuous, Jimmye Norman, MD, Last Rate: 50 mL/hr at 01/31/17 0457 .  acetaminophen (TYLENOL) solution 650 mg, 650 mg, Per Tube, Q4H PRN, Joaquim Nam, RPH, 650 mg at 01/28/17 1854 .  acetaminophen (TYLENOL) suppository 650 mg, 650 mg, Rectal, Q4H PRN, Almond Lint, MD, 650 mg at 01/27/17 0959 .  acetaminophen (TYLENOL) tablet 650 mg, 650 mg, Oral, Q4H PRN, Almond Lint, MD, 650 mg at 01/29/17 1550 .  bethanechol (URECHOLINE) tablet 10 mg, 10 mg, Per Tube, TID, Joaquim Nam, RPH, 10 mg at 02/01/17 0902 .  chlorhexidine (PERIDEX) 0.12 % solution 15 mL, 15 mL, Mouth Rinse, BID, [name], MD, 15 mL at 02/01/17 0902 .  Chlorhexidine Gluconate Cloth 2 % PADS 6 each, 6 each, Topical, Daily, [name], MD, 6 each at 02/01/17 [phone] .  pseudoephedrine (SUDAFED) tablet 30 mg, 30  mg, Oral, Q6H PRN **AND** chlorpheniramine (CHLOR-TRIMETON) tablet 4 mg, 4 mg, Oral, Q6H PRN, Edson Snowball, PA-C .  ciprofloxacin (CIPRO) IVPB 400 mg, 400 mg, Intravenous, Q12H, [name], MD,  400 mg at 01/31/17 2049 .  docusate (COLACE) 50 MG/5ML liquid 100 mg, 100 mg, Per Tube, Daily, Axel Filler, MD, Stopped at 02/01/17 [phone] .  feeding supplement (GLUCERNA 1.2 CAL) liquid 1,000 mL, 1,000 mL, Per Tube, Continuous, [name], MD, Last Rate: 55 mL/hr at 01/31/17 0108, 1,000 mL at 01/31/17 0108 .  feeding supplement (PRO-STAT SUGAR FREE 64) liquid 30 mL, 30 mL, Per Tube, BID, [name], MD, 30 mL at 02/01/17 0903 .  guaiFENesin (ROBITUSSIN) 100 MG/5ML solution 200 mg, 10 mL, Per Tube, Q4H PRN, Gaynelle Adu, MD, 200 mg at 01/31/17 1038 .  heparin injection 5,000 Units, 5,000 Units, Subcutaneous, Q8H, Coletta Memos, MD, 5,000 Units at 02/01/17 0429 .  hydrALAZINE (APRESOLINE) injection 10 mg, 10 mg, Intravenous, Q2H PRN, Almond Lint, MD .  HYDROcodone-acetaminophen (HYCET) 7.5-325 mg/15 ml solution 10 mL, 10 mL, Per Tube, Q4H PRN, [name], MD, 10 mL at 01/31/17 2359 .  insulin aspart (novoLOG) injection 0-15 Units, 0-15 Units, Subcutaneous, Q4H, Almond Lint, MD, 2 Units at 02/01/17 0429 .  insulin glargine (LANTUS) injection 15 Units, 15 Units, Subcutaneous, BID, Jimmye Norman, MD, 15 Units at 02/01/17 [phone] .  ipratropium-albuterol (DUONEB) 0.5-2.5 (3) MG/3ML nebulizer solution 3 mL, 3 mL, Nebulization, Q6H PRN, [name], MD, 3 mL at 01/29/17 1911 .  labetalol (NORMODYNE,TRANDATE) injection 10-40 mg, 10-40 mg, Intravenous, Q10 min PRN, Coletta Memos, MD .  levETIRAcetam (KEPPRA) 100 MG/ML solution 750 mg, 750 mg, Oral, BID, Berna Bue, MD, 750 mg at 02/01/17 0903 .  LORazepam (ATIVAN) injection 1 mg, 1 mg, Intravenous, Q2H PRN, Rodman Pickle, MD, 1 mg at 01/30/17 4782 .  magnesium hydroxide (MILK OF MAGNESIA) suspension 30 mL, 30 mL, Per Tube, Daily, Joaquim Nam, RPH, 30 mL at 02/01/17 0902 .  MEDLINE mouth rinse, 15 mL, Mouth Rinse, q12n4p, [name], MD, 15 mL at 01/31/17 1200 .  multivitamin liquid 15 mL, 15 mL, Per Tube, Daily, [name],  MD, 15 mL at 02/01/17 0903 .  naloxone [facility]) injection 0.08 mg, 0.08 mg, Intravenous, PRN, Coletta Memos, MD .  ondansetron (ZOFRAN) tablet 4 mg, 4 mg, Oral, Q4H PRN **OR** ondansetron (ZOFRAN) injection 4 mg, 4 mg, Intravenous, Q4H PRN, Coletta Memos, MD .  pantoprazole sodium (PROTONIX) 40 mg/20 mL oral suspension 40 mg, 40 mg, Per Tube, Daily, Gaynelle Adu, MD, 40 mg at 02/01/17 0902 .  promethazine (PHENERGAN) tablet 12.5-25 mg, 12.5-25 mg, Oral, Q4H PRN, Coletta Memos, MD .  senna (SENOKOT) tablet 8.6 mg, 1 tablet, Per Tube, Daily, Axel Filler, MD, 8.6 mg at 01/31/17 1030 .  sodium chloride flush (NS) 0.9 % injection 10-40 mL, 10-40 mL, Intracatheter, PRN, [name], MD, 10 mL at 01/31/17 1222  Patients Current Diet: Diet NPO time specified  Precautions / Restrictions Precautions Precautions: Fall Precaution Comments: Bone flap removed on R; NG tube Restrictions Weight Bearing Restrictions: No   Has the patient had 2 or more falls or a fall with injury in the past year?Yes.  Had a fall recently PTA.  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  Home Assistive Devices / Equipment Home Assistive Devices/Equipment: None Home Equipment: None  Prior Device Use: Indicate devices/aids used by the patient prior to current illness, exacerbation or injury?  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  Self Care: Did the patient need help bathing, dressing, using the toilet or eating?  Independent  Indoor Mobility: Did the patient need assistance with walking from room to room (with or without device)? Independent  Stairs: Did the patient need assistance with internal or external stairs (with or without device)? Independent  Functional Cognition: Did the patient need help planning regular tasks such as shopping  or remembering to take medications? Independent  Current Functional Level Cognition  Arousal/Alertness: Lethargic Overall Cognitive Status: Impaired/Different from baseline Current Attention Level: Focused Orientation Level: Disoriented X4 Following Commands: Follows one step commands inconsistently, Follows one step commands with increased time General Comments: Multimodal cues utilized for attention to task today. Tracking therapist and objects L>R. Grabbed washcloth and wiped his mouth when cued that his mouth was wet.  Attention: Focused Focused Attention: Impaired Focused Attention Impairment: Verbal basic, Functional basic Problem Solving: Impaired Problem Solving Impairment: Functional basic Safety/Judgment: Impaired [place] [address] Scales of Cognitive Functioning: Localized response ([place] 12)    Extremity Assessment (includes Sensation/Coordination)  Upper Extremity Assessment: RUE deficits/detail, LUE deficits/detail RUE Deficits / Details: Pt spontaneously moving elbow distally  LUE Deficits / Details: Pt moving Lt UE purposefully   Lower Extremity Assessment: Defer to PT evaluation RLE Deficits / Details: No active movement in RLE; low tone LLE Deficits / Details: No active movement in LLE; low tone    ADLs  Overall ADL's : Needs assistance/impaired Eating/Feeding: NPO Grooming: Sitting, Moderate assistance, Wash/dry face Grooming Details (indicate cue type and reason): Pt requires total A for EOB sitting.  He will spontaneously wipe mouth and wipe hands, but with decreased thoroughness  Upper Body Bathing: Total assistance, Bed level Lower Body Bathing: Total assistance, Bed level Upper Body Dressing : Total assistance, Bed level Lower Body Dressing: Total assistance, Bed level Toilet Transfer: Total assistance Toileting- Clothing Manipulation and Hygiene: Total assistance, Bed level Functional mobility during ADLs: Total assistance, +2 for physical assistance     Mobility  Overal bed mobility: Needs Assistance Bed Mobility: Supine to Sit, Sit to Supine Supine to sit: Total assist, +2 for physical assistance Sit to supine: Total assist, +2 for physical assistance General bed mobility comments: Pt did not attempt to assist     Transfers  Overall transfer level: Needs assistance Equipment used: 2 person hand held assist Transfers: Sit to/from Stand Sit to Stand: Total assist, +2 physical assistance General transfer comment: Not able to attempt this session    Ambulation / Gait / Stairs / Wheelchair Mobility       Posture / Balance Dynamic Sitting Balance Sitting balance - Comments: Heavy mod to max assist required for sitting EOB. Holding head more upright this session for short bouts with inability to maintain.  Balance Overall balance assessment: Needs assistance Sitting-balance support: Feet supported Sitting balance-Leahy Scale: Poor Sitting balance - Comments: Heavy mod to max assist required for sitting EOB. Holding head more upright this session for short bouts with inability to maintain.  Postural control: Posterior lean    Special needs/care consideration BiPAP/CPAP No CPM No Continuous Drip IV 0.9% NS at 50 mL/hr Dialysis No        Life Vest No Oxygen 2L Barry Special Bed No Trach Size No Wound Vac (area) No    Skin Crani right scalp incision  Bowel mgmt: Last BM 01/30/17 Bladder mgmt: Condom catheter for urinary retention Diabetic mgmt Yes, diabetic Restraints:  Mittens on both hands   Previous Home Environment Living Arrangements: Other relatives Available Help at Discharge: Family, Available 24 hours/day Type of Home: House Home Layout: Two level, Able to live on main level with bedroom/bathroom, Other (Comment) Alternate Level Stairs-Rails: Left Alternate Level Stairs-Number of Steps: 15 Home Access: Level entry Bathroom Shower/Tub: Tub/shower unit Home Care Services: No Additional  Comments: sister reports she will be primary caregiver at discharge   Discharge Living Setting Plans for Discharge Living Setting: House, Lives with (comment) (Townhouse) Type of Home at Discharge: House Teacher, adult education) Discharge Home Layout: Two level, 1/2 bath on main level, Bed/bath upstairs (Plans to stay in living room/den on main floor.) Alternate Level Stairs-Number of Steps: 15 Discharge Home Access: Level entry Does the patient have any problems obtaining your medications?: Yes (Describe) (Patient with no insurance.)  Social/Family/Support Systems Patient Roles: Other (Comment) (Has a sister and brother-in-law.) Contact Information: [name] - sister Anticipated Caregiver: sister and family Anticipated Caregiver's Contact Information: [name] - sister - [phone] Ability/Limitations of Caregiver: Sister plans to be caregiver, her husband will assist as able. Caregiver Availability: 24/7 Discharge Plan Discussed with Primary Caregiver: Yes Is Caregiver In Agreement with Plan?: Yes Does Caregiver/Family have Issues with Lodging/Transportation while Pt is in Rehab?: No  Goals/Additional Needs Patient/Family Goal for Rehab: PT mod I and supervision, OT mod I to supervision to min assist, SLP supervision to min assist goals Expected length of stay: 18-25 days Cultural Considerations: Speaks very little [place], speaks [place]. Dietary Needs: NPO with tube feedings Equipment Needs: TBD Pt/Family Agrees to Admission and willing to participate: Yes (I spoke with patient's sister, [name].) Program Orientation Provided & Reviewed with Pt/Caregiver Including Roles  & Responsibilities: Yes  Decrease burden of Care through IP rehab admission: N/A  Possible need for SNF placement upon discharge: Not planned, sister plans to care for patient at home after rehab.  Patient Condition: This patient's medical and functional status has changed since the consult dated: 01/21/17 in which the  Rehabilitation Physician determined and documented that the patient's condition is appropriate for intensive rehabilitative care in an inpatient rehabilitation facility. See "History of Present Illness" (above) for medical update. Functional changes are: Currently requiring total assist. Patient's medical and functional status update has been discussed with the Rehabilitation physician and patient remains appropriate for inpatient rehabilitation. Will admit to inpatient rehab today.  Preadmission Screen Completed By:  Trish Mage, 02/01/2017 11:31 AM ______________________________________________________________________   Discussed status with Dr. Riley Kill on 02/01/17 at 1150 and received telephone approval for admission today.  Admission Coordinator:  Trish Mage, time 1150/Date 02/01/17

## 2017-02-01 NOTE — Progress Notes (Signed)
qPhysical Therapy Treatment Patient Details Name: [name] MRN: [id number] DOB: 06-Oct-1966 Today's Date: 02/01/2017    History of Present Illness pt was found down on 3/11 after a fall on stairs the evening before.  Pt sustained R Frontal Intraparenchymal Hemorrhage, R SDH, and SAH with midline shift.  Pt also sustained non-displaced L Temporal Bone fx and L Mastoid fx.  Follow-up CT on 3/14 showed additional L Temporal Hemorrhagic Contusion.  Pt not protecting airway and then intubated once in ICU on 3/11.  On 3/12 pt underwent burr hole with placement of ICP monitor.  Pt is now s/p Frontotemporal Parietal Craniotomy with bone flap removed and placed in abdomen on 3/14.  pt with hx of DM.      PT Comments    Pt progressing towards physical therapy goals. Pt seen with SLP for TBI team interventions. This session, pt demonstrates behaviors consistent with Rancho Level IV (confused/agitated). He follows commands ~40% of the time with continued delayed response. Throughout session, pt grabbing at lines/gown/washcloth/mits with increased anxiety noted at end of session and a sudden increase in frequency of pt grabbing at these items. Noted CIR to admit today. Will continue to follow until inpatient rehab team takes over.   Follow Up Recommendations  CIR     Equipment Recommendations  None recommended by PT    Recommendations for Other Services Rehab consult;OT consult     Precautions / Restrictions Precautions Precautions: Fall Precaution Comments: Bone flap removed on R; NG tube Restrictions Weight Bearing Restrictions: No    Mobility  Bed Mobility Overal bed mobility: Needs Assistance Bed Mobility: Supine to Sit;Sit to Supine     Supine to sit: Total assist;+2 for physical assistance Sit to supine: Max assist;+2 for physical assistance   General bed mobility comments: Pt beginning to lay down at end of session. Max +2 still provided for LE elevation and positioning.    Transfers Overall transfer level: Needs assistance Equipment used: 2 person hand held assist Transfers: Sit to/from Stand Sit to Stand: Total assist;+2 physical assistance         General transfer comment: Pt able to briefly improve posture during standing EOB with VC's in spanish. Unable to maintain. Cleared bed with hips enough to get a clean bed pad under him. Stood x2 to perform pericare (with 3rd person) and then place clean pad under pt.   Ambulation/Gait                 Stairs            Wheelchair Mobility    Modified Rankin (Stroke Patients Only) Modified Rankin (Stroke Patients Only) Pre-Morbid Rankin Score: No symptoms Modified Rankin: Severe disability     Balance Overall balance assessment: Needs assistance Sitting-balance support: Feet supported Sitting balance-Leahy Scale: Poor Sitting balance - Comments: Heavy mod to max assist required for sitting EOB. Holding head more upright this session for short bouts with inability to maintain.  Postural control: Posterior lean;Right lateral lean;Left lateral lean Standing balance support: Bilateral upper extremity supported;During functional activity Standing balance-Leahy Scale: Zero                              Cognition Arousal/Alertness: Awake/alert Behavior During Therapy: Flat affect Overall Cognitive Status: Impaired/Different from baseline Area of Impairment: Attention;Following commands               Rancho Levels of Cognitive Functioning [facility] Scales  of Cognitive Functioning: Confused/agitated Orientation Level: Disoriented to;Time;Situation Current Attention Level: Focused   Following Commands: Follows one step commands inconsistently;Follows one step commands with increased time   Awareness: Intellectual Problem Solving: Slow processing;Decreased initiation;Requires verbal cues;Requires tactile cues General Comments: Multimodal cues utilized for  attention to task today. Tracking therapist and objects L>R. Grabbed washcloth and wiped his mouth when cued that his mouth was wet.       Exercises      General Comments General comments (skin integrity, edema, etc.): Very delayed and minor withdrawl to pain on RLE. Noted active LLE movement in supine, and faint active hip flexion on L in sitting.       Pertinent Vitals/Pain Pain Assessment: Faces Faces Pain Scale: No hurt    Home Living                      Prior Function            PT Goals (current goals can now be found in the care plan section) Acute Rehab PT Goals Patient Stated Goal: Per sister for pt to be able to return to her home. PT Goal Formulation: Patient unable to participate in goal setting Time For Goal Achievement: 02/03/17 Progress towards PT goals: Progressing toward goals    Frequency    Min 3X/week      PT Plan Current plan remains appropriate    Co-evaluation PT/OT/SLP Co-Evaluation/Treatment: Yes Reason for Co-Treatment: Complexity of the patient's impairments (multi-system involvement);Necessary to address cognition/behavior during functional activity;To address functional/ADL transfers PT goals addressed during session: Balance;Mobility/safety with mobility       End of Session Equipment Utilized During Treatment: Oxygen Activity Tolerance: Patient tolerated treatment well Patient left: in bed;with call bell/phone within reach;with family/visitor present;with restraints reapplied;with SCD's reapplied Nurse Communication: Mobility status PT Visit Diagnosis: Muscle weakness (generalized) (M62.81)     Time: 4098-1191 PT Time Calculation (min) (ACUTE ONLY): 47 min  Charges:  $Therapeutic Activity: 8-22 mins $Neuromuscular Re-education: 8-22 mins                    G Codes:       Conni Slipper, PT, DPT Acute Rehabilitation Services Pager: [phone]    Marylynn Pearson 02/01/2017, 2:57 PM

## 2017-02-01 NOTE — Progress Notes (Signed)
Made CM aware of pt's dc order at this time.

## 2017-02-01 NOTE — Progress Notes (Signed)
19 Days Post-Op  Subjective: CC awake, speaking, had some R flank pain yesterday  Objective: Vital signs in last 24 hours: Temp:  [97.7 F (36.5 C)-97.8 F (36.6 C)] 97.8 F (36.6 C) (04/02 0513) Pulse Rate:  [88-102] 88 (04/02 0513) Resp:  [17-18] 18 (04/02 0513) BP: (96-114)/(59-72) 108/67 (04/02 0513) SpO2:  [95 %-100 %] 97 % (04/02 0513) Weight:  [65.6 kg (144 lb 9.6 oz)] 65.6 kg (144 lb 9.6 oz) (04/02 0429) Last BM Date: 01/30/17  Intake/Output from previous day: 04/01 0701 - 04/02 0700 In: 10 [I.V.:10] Out: 2200 [Urine:2200] Intake/Output this shift: No intake/output data recorded.  General appearance: cooperative Resp: clear to auscultation bilaterally Cardio: regular rate and rhythm GI: soft, NT, ND Incision/Wound:scalp CDI Neuro: speaks in Spanish, F/C including LLE  Lab Results: CBC   Recent Labs  01/30/17 0426  WBC 7.7  HGB 9.1*  HCT 28.5*  PLT 454*   BMET  Recent Labs  01/30/17 0426  NA 140  K 4.0  CL 106  CO2 27  GLUCOSE 140*  BUN 16  CREATININE 0.45*  CALCIUM 8.4*   PT/INR No results for input(s): LABPROT, INR in the last 72 hours. ABG No results for input(s): PHART, HCO3 in the last 72 hours.  Invalid input(s): PCO2, PO2  Studies/Results: No results found.  Anti-infectives: Anti-infectives    Start     Dose/Rate Route Frequency Ordered Stop   01/28/17 0930  ciprofloxacin (CIPRO) IVPB 400 mg     400 mg 200 mL/hr over 60 Minutes Intravenous Every 12 hours 01/28/17 0926 02/02/17 0929   01/18/17 0845  ceFAZolin (ANCEF) IVPB 1 g/50 mL premix     1 g 100 mL/hr over 30 Minutes Intravenous Every 8 hours 01/18/17 0843 01/22/17 2217   01/13/17 1830  piperacillin-tazobactam (ZOSYN) IVPB 3.375 g  Status:  Discontinued     3.375 g 12.5 mL/hr over 240 Minutes Intravenous Every 8 hours 01/13/17 1016 01/18/17 0843   01/13/17 1030  piperacillin-tazobactam (ZOSYN) IVPB 3.375 g     3.375 g 100 mL/hr over 30 Minutes Intravenous  Once 01/13/17  1016 01/13/17 1125   01/10/17 1600  metroNIDAZOLE (FLAGYL) IVPB 500 mg     500 mg 100 mL/hr over 60 Minutes Intravenous  Once 01/10/17 1518 01/10/17 1629   01/10/17 1045  cefTRIAXone (ROCEPHIN) 1 g in dextrose 5 % 50 mL IVPB     1 g 100 mL/hr over 30 Minutes Intravenous  Once 01/10/17 1035 01/10/17 1248   01/10/17 1045  azithromycin (ZITHROMAX) 500 mg in dextrose 5 % 250 mL IVPB     500 mg 250 mL/hr over 60 Minutes Intravenous  Once 01/10/17 1035 01/10/17 1353   01/10/17 1045  metroNIDAZOLE (FLAGYL) IVPB 500 mg  Status:  Discontinued     500 mg 100 mL/hr over 60 Minutes Intravenous  Once 01/10/17 1035 01/10/17 1517      Assessment/Plan: Fall TBI/L SDH/B ICC/IVH- S/P decompressive craniectomy 3/15 by Dr. Franky Macho, Dr. Yetta Barre following, on Keppra. CT head 3/27 reviewed and shows improvement, TBI team therapies. D/C staples. I asked Dr. Yetta Barre to F/U regarding timing of cranioplasty as sister is worried about this. UTI- urine culture growing E coli, onCipro IV day #5 DM/Hyperglycemia- SSI, Lantus 15u BID Cough - improving, continue to monitor. Lungs CTAB. Robitussin PRN. Continue protonix. FEN- NPO, TF per [employer] at 40mL/hr ABL anemia - Hg 9.1, (3/31) stable VTE- SQ heparin DIspo- Continue therapies. CIR to F/U. He has improved a lot over the weekend.  I spoke with his sister.  LOS: 22 days    [name], MD, MPH, FACS Trauma: [phone] General Surgery: [phone]  4/2/2018Patient ID: [name], male   DOB: 1966-08-08, 51 y.o.   MRN: [id number]

## 2017-02-01 NOTE — H&P (Signed)
Physical Medicine and Rehabilitation Admission H&P       Chief Complaint  Patient presents with  . Altered Mental Status  : HPI: Jamie Taillon Cardenasis a 51 y.o.right handedlimited English-speaking malewith history of hypertension, diabetes mellitus. Per chart review patient lives with sister was independent prior to admission.Plan is for sister and brother-in-law to provide assistance as needed at time of discharge. Presented 01/10/2017 fter being found down from a supposed fall. CT of the head and imaging revealed intracranial hemorrhage with a 4.5 cm right frontal intraparenchymal hemorrhage, 6 mm right subdural hematoma and other scattered areas of intraparenchymal and subarachnoid hemorrhage with right to left midline shift. Nondisplaced vertical fracture through the left temporal bone.CT cervical spine negative.CT abdomen and pelvis with no evidence of acute injury.UDS negative.neurosurgery Dr. Sherley Bounds advised conservative care with ICP monitor.follow-up ENT Dr. Redmond Baseman for temporal bone fracture no surgical intervention required.follow-up CT imaging showed increased right to left shift greater than 1 cm. Basal cisterns slightly effaced.Patient With steady rise in his ICP and not responsive to heavy sedation. .Underwent right frontal temporal parietal craniectomy for hematoma evacuation 01/14/2017 per Dr. Cyndy Freeze with placement of bone flap in the abdomen. Keppra for seizure prophylaxis.Patient was extubated 01/19/2017.Nasogastric tube in place for nutritional support patient remains NPO. Later placed on subcutaneous heparin for DVT prophylaxis. Placed on Ancef 01/18/2017 for suspected pneumonia 5 days. Bouts of urinary retention with Urecholine added as well as greater than 100,000 Escherichia coli UTI placed on Cipro 01/28/2017. Physical therapy evaluation completed with recommendations of physical medicine rehabilitation consult.patient was admitted for a comprehensive rehabilitation  program  Review of Systems  Unable to perform ROS: Acuity of condition       Past Medical History:  Diagnosis Date  . Diabetes mellitus without complication [facility])         Past Surgical History:  Procedure Laterality Date  . CRANIOTOMY Right 01/13/2017   Procedure: 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, Placement of bone flap in abdomen;  Surgeon: Ashok Pall, MD;  Location: [facility];  Service: Neurosurgery;  Laterality: Right;        Family History  Problem Relation Age of Onset  . Diabetes Mother   . Diabetes Father    Social History:  reports that he has never smoked. He has never used smokeless tobacco. He reports that he drinks alcohol. He reports that he does not use drugs. Allergies: No Known Allergies       Medications Prior to Admission  Medication Sig Dispense Refill  . metFORMIN (GLUCOPHAGE) 500 MG tablet Take 500 mg by mouth 2 (two) times daily with a meal.    . aspirin EC 81 MG tablet Take 1 tablet (81 mg total) by mouth daily. (Patient not taking: Reported on 01/11/2017) 90 tablet 3  . atorvastatin (LIPITOR) 40 MG tablet Take 1 tablet (40 mg total) by mouth daily at 6 PM. (Patient not taking: Reported on 01/11/2017) 90 tablet 0  . glipiZIDE (GLUCOTROL) 5 MG tablet Take 1 tablet (5 mg total) by mouth daily before breakfast. (Patient not taking: Reported on 01/11/2017) 90 tablet 1  . Linagliptin-Metformin HCl 2.03-999 MG TABS Take 1 tablet by mouth 2 (two) times daily. With food (Patient not taking: Reported on 01/11/2017) 180 tablet 1  . lisinopril (PRINIVIL,ZESTRIL) 2.5 MG tablet Take 1 tablet (2.5 mg total) by mouth daily. (Patient not taking: Reported on 01/11/2017) 90 tablet 0  . naproxen (NAPROSYN) 500 MG tablet Take 1 tablet (500 mg  total) by mouth 2 (two) times daily with a meal. (Patient not taking: Reported on 01/11/2017) 30 tablet 0    Home: Home Living Family/patient expects to be discharged to:: Private residence Living  Arrangements: Other relatives (Sister) Available Help at Discharge: Family, Available 24 hours/day Type of Home: House Home Access: Level entry Home Layout: Two level, Able to live on main level with bedroom/bathroom, Other (Comment) (Shower upstairs) Alternate Level Stairs-Number of Steps: 15 Alternate Level Stairs-Rails: Left Bathroom Shower/Tub: Tub/shower unit Home Equipment: None Additional Comments: Pt only Spanish-speaking. Sister reports she plans to have pt live with her upon d/c, as she will be available for 24/7 assist.    Functional History: Prior Function Level of Independence: Independent Comments: According to sister, pt lives with roommate, works as Development worker, international [employer], and drives.  Functional Status:  Mobility: Bed Mobility Overal bed mobility: Needs Assistance Bed Mobility: Supine to Sit, Sit to Supine Supine to sit: Total assist Sit to supine: Total assist  ADL:  Cognition: Cognition Overall Cognitive Status: Impaired/Different from baseline Arousal/Alertness: Lethargic Orientation Level: Other (comment) (mute) Attention: Focused Focused Attention: Impaired Focused Attention Impairment: Verbal basic, Functional basic Problem Solving: Impaired Problem Solving Impairment: Functional basic Safety/Judgment: Impaired Rancho Los Amigos Scales of Cognitive Functioning: Generalized response (some emerging III) Cognition Arousal/Alertness: Lethargic Behavior During Therapy: Flat affect Overall Cognitive Status: Impaired/Different from baseline Area of Impairment: Attention, Following commands, Awareness, Problem solving, Rancho level, Orientation Orientation Level: Disoriented to Current Attention Level: Focused Following Commands: Follows one step commands inconsistently Awareness: Intellectual Problem Solving: Slow processing, Decreased initiation, Requires verbal cues, Requires tactile cues General Comments: Decreased ability to participate with therapy today,  requiring max multimodal cues to open eyes; inconsistently follows one-step commands to open eyes and look at therapist. Able to visually track therapist to L-side and midline; intermittent tracking to R-side. Nonpurposeful response with fingers to painful stimulus in BUE; no response in BLE.   Physical Exam: Blood pressure 128/77, pulse 85, temperature 98.2 F (36.8 C), temperature source Axillary, resp. rate 19, height _0  (1.549 m), weight 63 kg (138 lb 14.2 oz), SpO2 97 %. Physical Exam  Vitals reviewed. HENT:  Craniotomy site clean and dry. Nasogastric tube in place  Eyes:  Pupils reactive to light  Neck: Normal range of motion. Neck supple. No thyromegaly present.  Cardiovascular: Normal rate and regular rhythm.   Respiratory:  Limited inspiratory effort but clear to auscultation  GI: Soft. Bowel sounds are normal. He exhibits no distension.  Neurological: He is alert.  Patient is restless alert. Bilateral mittens in place. Moving all extremities. He did have some spontaneous speech primarily speaking [place]. He was not able to say his name but was able to say thank you. Followed simple 1 step commands less than 10%. Difficulty initiating any purposeful movement or speech. Family states he understands some basic english  Psychiatric:  Restless and distracted   Skin. Warm and dry  Lab Results Last 48 Hours        Results for orders placed or performed during the hospital encounter of 01/10/17 (from the past 48 hour(s))  Glucose, capillary     Status: Abnormal   Collection Time: 01/19/17  3:52 PM  Result Value Ref Range   Glucose-Capillary 106 (H) 65 - 99 mg/dL  Glucose, capillary     Status: Abnormal   Collection Time: 01/19/17  8:05 PM  Result Value Ref Range   Glucose-Capillary 103 (H) 65 - 99 mg/dL  Glucose, capillary  Status: Abnormal   Collection Time: 01/19/17 11:28 PM  Result Value Ref Range   Glucose-Capillary 151 (H) 65 - 99 mg/dL  Glucose, capillary      Status: Abnormal   Collection Time: 01/20/17  4:36 AM  Result Value Ref Range   Glucose-Capillary 167 (H) 65 - 99 mg/dL  Basic metabolic panel     Status: Abnormal   Collection Time: 01/20/17  6:27 AM  Result Value Ref Range   Sodium 132 (L) 135 - 145 mmol/L   Potassium 3.9 3.5 - 5.1 mmol/L   Chloride 96 (L) 101 - 111 mmol/L   CO2 27 22 - 32 mmol/L   Glucose, Bld 199 (H) 65 - 99 mg/dL   BUN 16 6 - 20 mg/dL   Creatinine, Ser 0.52 (L) 0.61 - 1.24 mg/dL   Calcium 8.2 (L) 8.9 - 10.3 mg/dL   GFR calc non Af Amer >60 >60 mL/min   GFR calc Af Amer >60 >60 mL/min    Comment: (NOTE) The eGFR has been calculated using the CKD EPI equation. This calculation has not been validated in all clinical situations. eGFR's persistently <60 mL/min signify possible Chronic Kidney Disease.    Anion gap 9 5 - 15  Glucose, capillary     Status: Abnormal   Collection Time: 01/20/17  7:56 AM  Result Value Ref Range   Glucose-Capillary 190 (H) 65 - 99 mg/dL   Comment 1 Notify RN    Comment 2 Document in Chart   Glucose, capillary     Status: Abnormal   Collection Time: 01/20/17 11:46 AM  Result Value Ref Range   Glucose-Capillary 225 (H) 65 - 99 mg/dL   Comment 1 Notify RN    Comment 2 Document in Chart   Glucose, capillary     Status: Abnormal   Collection Time: 01/20/17  3:22 PM  Result Value Ref Range   Glucose-Capillary 192 (H) 65 - 99 mg/dL  Glucose, capillary     Status: Abnormal   Collection Time: 01/20/17  7:52 PM  Result Value Ref Range   Glucose-Capillary 171 (H) 65 - 99 mg/dL  Glucose, capillary     Status: Abnormal   Collection Time: 01/20/17 11:11 PM  Result Value Ref Range   Glucose-Capillary 194 (H) 65 - 99 mg/dL  Basic metabolic panel     Status: Abnormal   Collection Time: 01/21/17  5:00 AM  Result Value Ref Range   Sodium 138 135 - 145 mmol/L   Potassium 3.8 3.5 - 5.1 mmol/L   Chloride 103 101 - 111 mmol/L   CO2 26 22 - 32 mmol/L     Glucose, Bld 157 (H) 65 - 99 mg/dL   BUN 17 6 - 20 mg/dL   Creatinine, Ser 0.43 (L) 0.61 - 1.24 mg/dL   Calcium 7.8 (L) 8.9 - 10.3 mg/dL   GFR calc non Af Amer >60 >60 mL/min   GFR calc Af Amer >60 >60 mL/min    Comment: (NOTE) The eGFR has been calculated using the CKD EPI equation. This calculation has not been validated in all clinical situations. eGFR's persistently <60 mL/min signify possible Chronic Kidney Disease.    Anion gap 9 5 - 15  Glucose, capillary     Status: Abnormal   Collection Time: 01/21/17  5:12 AM  Result Value Ref Range   Glucose-Capillary 153 (H) 65 - 99 mg/dL  Glucose, capillary     Status: Abnormal   Collection Time: 01/21/17  7:58 AM  Result  Value Ref Range   Glucose-Capillary 139 (H) 65 - 99 mg/dL   Comment 1 Notify RN    Comment 2 Document in Chart   Glucose, capillary     Status: Abnormal   Collection Time: 01/21/17 11:33 AM  Result Value Ref Range   Glucose-Capillary 119 (H) 65 - 99 mg/dL      Imaging Results (Last 48 hours)  Dg Abd Portable 1v  Result Date: 01/21/2017 CLINICAL DATA:  Nasogastric tube placement EXAM: PORTABLE ABDOMEN - 1 VIEW COMPARISON:  01/19/2017 FINDINGS: Nasogastric tube with the tip projecting over the duodenojejunal flexure. Persistent gaseous distention of small bowel and colon. There is no evidence of pneumoperitoneum, portal venous gas or pneumatosis. There are no pathologic calcifications along the expected course of the ureters. The osseous structures are unremarkable. IMPRESSION: Nasogastric tube with the tip projecting over the duodenojejunal flexure. Electronically Signed   By: Kathreen Devoid   On: 01/21/2017 11:22   Dg Abd Portable 1v  Result Date: 01/19/2017 CLINICAL DATA:  Check for feeding catheter placement EXAM: PORTABLE ABDOMEN - 1 VIEW COMPARISON:  01/17/2017 FINDINGS: Previously seen nasogastric catheter has been removed and a feeding catheter has been placed in lies within the  junction of the third and fourth portions of the duodenum just short of the ligamenta trans. Scattered large and small bowel gas is noted. No obstructive changes are seen. No bony abnormality is noted. Rounded density is noted over the right mid abdomen likely extrinsic to the patient. Clinical correlation is recommended. IMPRESSION: Feeding catheter within the distal duodenum as described. Vague rounded density overlying the right mid abdomen laterally likely related to extrinsic artifact. Clinical correlation is recommended. Electronically Signed   By: Inez Catalina M.D.   On: 01/19/2017 16:11        Medical Problem List and Plan: 1.  Decreased functional mobility with cognitive deficits secondary to right IPH/SDH/temporal bone fracture after fall S/P right frontal temporoparietal craniectomy 01/14/2017 with placement of bone flap in the abdomen             -admit to inpatient rehab 2.  DVT Prophylaxis/Anticoagulation: Subcutaneous heparin. Monitor platelet counts and any signs of bleeding. Check vascular study 3. Pain Management: Hycet 4. Mood: Ativan as needed 5. Neuropsych: This patient is not capable of making decisions on his own behalf. 6. Skin/Wound Care: Routine skin checks 7. Fluids/Electrolytes/Nutrition: Routine I&O with follow-up chemistries 8.Seizure prophylaxis. Keppra 750 mg twice daily 9.Pneumonia. Five-day course of Ancef to be completed 01/22/2017 10.Dysphagia. CorTrak nasogastric tube for a traditional support.Follow-up speech therapy for dysphagia 11.Urinary retention/Escherichia coli UTI. Continue Urecholine. Complete course of Cipro. Check PVR 3 12.Diabetes mellitus with peripheral neuropathy. Hemoglobin A1c 10.4. Lantus insulin 15 units twice a day. Patient on Glucophage 500 mg twice a day and Glucotrol 5 mg daily prior to admission. Resume his needed 13.History of hypertension. Patient on lisinopril 2.5 mg daily prior to admission. Resume as needed 14.  Constipation. Laxative assistance  Post Admission Physician Evaluation: 1. Functional deficits secondary  to right SDH,IPH with temporal bone fracture after fall. 2. Patient is admitted to receive collaborative, interdisciplinary care between the physiatrist, rehab nursing staff, and therapy team. 3. Patient's level of medical complexity and substantial therapy needs in context of that medical necessity cannot be provided at a lesser intensity of care such as a SNF. 4. Patient has experienced substantial functional loss from his/her baseline which was documented above under the "Functional History" and "Functional Status" headings.  Judging by the  patient's diagnosis, physical exam, and functional history, the patient has potential for functional progress which will result in measurable gains while on inpatient rehab.  These gains will be of substantial and practical use upon discharge  in facilitating mobility and self-care at the household level. 5. Physiatrist will provide 24 hour management of medical needs as well as oversight of the therapy plan/treatment and provide guidance as appropriate regarding the interaction of the two. 6. The Preadmission Screening has been reviewed and patient status is unchanged unless otherwise stated above. 7. 24 hour rehab nursing will assist with bladder management, bowel management, safety, skin/wound care, disease management, medication administration, pain management and patient education  and help integrate therapy concepts, techniques,education, etc. 8. PT will assess and treat for/with: Lower extremity strength, range of motion, stamina, balance, functional mobility, safety, adaptive techniques and equipment, NMR, cognitive perceptual awareness, family education, initiation.   Goals are: supervision to min assist. 9. OT will assess and treat for/with: ADL's, functional mobility, safety, upper extremity strength, adaptive techniques and equipment, NMR, cognitive  perceptual awareness, initiation, family ed.   Goals are: supervision to min assist. Therapy may not yet proceed with showering this patient. 10. SLP will assess and treat for/with: cognition, communication, swallowing, family education.  Goals are: min to mod assist. 11. Case Management and Social Worker will assess and treat for psychological issues and discharge planning. 12. Team conference will be held weekly to assess progress toward goals and to determine barriers to discharge. 13. Patient will receive at least 3 hours of therapy per day at least 5 days per week. 14. ELOS: 25-30 days       15. Prognosis:  good     Meredith Staggers, MD, [facility] 02/01/2017  Cathlyn Parsons., PA-C 01/21/2017

## 2017-02-01 NOTE — Progress Notes (Signed)
Pt admitted to unit at 1600 with sister at bedside. Pt A&O to self only. Unable to follow commands to complete my head to toe assessment. Pt non verbal with NG tube in right nare, BL mittens, and SRx3 with bed alarm in place.  Tele sitter ordered. Pt continues to attempt to remove mittens and pull at NG tube. Sister at bedside for now.   RN educated sister on rehab process and safety plan.

## 2017-02-01 NOTE — Progress Notes (Signed)
Rehab admissions - Patient is responding and is awake today.  I have approval from Dr. Riley Kill to admit today and he has been cleared by trauma team for admit today.  Bed available and will admit today.  Call me for questions.  #[phone]

## 2017-02-02 ENCOUNTER — Inpatient Hospital Stay (HOSPITAL_COMMUNITY): Payer: Self-pay

## 2017-02-02 ENCOUNTER — Inpatient Hospital Stay (HOSPITAL_COMMUNITY): Payer: Self-pay | Admitting: Physical Therapy

## 2017-02-02 ENCOUNTER — Encounter (HOSPITAL_COMMUNITY): Payer: Self-pay | Admitting: *Deleted

## 2017-02-02 ENCOUNTER — Inpatient Hospital Stay (HOSPITAL_COMMUNITY): Payer: Self-pay | Admitting: Speech Pathology

## 2017-02-02 DIAGNOSIS — M7989 Other specified soft tissue disorders: Secondary | ICD-10-CM

## 2017-02-02 LAB — GLUCOSE, CAPILLARY
GLUCOSE-CAPILLARY: 131 mg/dL — AB (ref 65–99)
GLUCOSE-CAPILLARY: 144 mg/dL — AB (ref 65–99)
GLUCOSE-CAPILLARY: 146 mg/dL — AB (ref 65–99)
GLUCOSE-CAPILLARY: 150 mg/dL — AB (ref 65–99)
GLUCOSE-CAPILLARY: 163 mg/dL — AB (ref 65–99)
Glucose-Capillary: 224 mg/dL — ABNORMAL HIGH (ref 65–99)

## 2017-02-02 LAB — COMPREHENSIVE METABOLIC PANEL
ALT: 79 U/L — ABNORMAL HIGH (ref 17–63)
AST: 39 U/L (ref 15–41)
Albumin: 2.5 g/dL — ABNORMAL LOW (ref 3.5–5.0)
Alkaline Phosphatase: 76 U/L (ref 38–126)
Anion gap: 6 (ref 5–15)
BUN: 15 mg/dL (ref 6–20)
CHLORIDE: 106 mmol/L (ref 101–111)
CO2: 26 mmol/L (ref 22–32)
Calcium: 8.4 mg/dL — ABNORMAL LOW (ref 8.9–10.3)
Creatinine, Ser: 0.45 mg/dL — ABNORMAL LOW (ref 0.61–1.24)
GFR calc Af Amer: >60 mL/min (ref >60)
Glucose, Bld: 169 mg/dL — ABNORMAL HIGH (ref 65–99)
POTASSIUM: 3.8 mmol/L (ref 3.5–5.1)
Sodium: 138 mmol/L (ref 135–145)
Total Bilirubin: 0.2 mg/dL — ABNORMAL LOW (ref 0.3–1.2)
Total Protein: 6.4 g/dL — ABNORMAL LOW (ref 6.5–8.1)

## 2017-02-02 LAB — CBC WITH DIFFERENTIAL/PLATELET
BASOS ABS: 0 10*3/uL (ref 0.0–0.1)
Basophils Relative: 0 %
EOS PCT: 1 %
Eosinophils Absolute: 0.1 10*3/uL (ref 0.0–0.7)
HCT: 29.6 % — ABNORMAL LOW (ref 39.0–52.0)
Hemoglobin: 9.9 g/dL — ABNORMAL LOW (ref 13.0–17.0)
LYMPHS ABS: 1.1 10*3/uL (ref 0.7–4.0)
LYMPHS PCT: 18 %
MCH: 29 pg (ref 26.0–34.0)
MCHC: 33.4 g/dL (ref 30.0–36.0)
MCV: 86.8 fL (ref 78.0–100.0)
Monocytes Absolute: 0.3 10*3/uL (ref 0.1–1.0)
Monocytes Relative: 4 %
NEUTROS ABS: 4.4 10*3/uL (ref 1.7–7.7)
NEUTROS PCT: 77 %
PLATELETS: 402 10*3/uL — AB (ref 150–400)
RBC: 3.41 MIL/uL — ABNORMAL LOW (ref 4.22–5.81)
RDW: 13.4 % (ref 11.5–15.5)
WBC: 5.8 10*3/uL (ref 4.0–10.5)

## 2017-02-02 MED ORDER — METHYLPHENIDATE HCL 5 MG PO TABS
5.0000 mg | ORAL_TABLET | Freq: Two times a day (BID) | ORAL | Status: DC
  Administered 2017-02-02 – 2017-02-09 (×14): 5 mg
  Filled 2017-02-02 (×14): qty 1

## 2017-02-02 MED ORDER — GLUCERNA 1.2 CAL PO LIQD
1000.0000 mL | ORAL | Status: DC
  Administered 2017-02-02 – 2017-02-07 (×7): 1000 mL
  Administered 2017-02-07: 948 mL
  Administered 2017-02-09: 1000 mL
  Filled 2017-02-02 (×4): qty 1000
  Filled 2017-02-02: qty 1500
  Filled 2017-02-02 (×9): qty 1000
  Filled 2017-02-02: qty 1500
  Filled 2017-02-02 (×2): qty 1000

## 2017-02-02 MED ORDER — LORAZEPAM 2 MG/ML IJ SOLN
1.0000 mg | Freq: Four times a day (QID) | INTRAMUSCULAR | Status: DC | PRN
  Administered 2017-02-06 – 2017-02-08 (×4): 1 mg via INTRAVENOUS
  Filled 2017-02-02 (×4): qty 1

## 2017-02-02 MED ORDER — QUETIAPINE FUMARATE 50 MG PO TABS: 50.0000 mg | ORAL_TABLET | Freq: Every evening | ORAL | Status: DC | PRN

## 2017-02-02 MED ORDER — QUETIAPINE FUMARATE 50 MG PO TABS
50.0000 mg | ORAL_TABLET | Freq: Every day | ORAL | Status: DC
  Administered 2017-02-02 – 2017-02-09 (×7): 50 mg
  Filled 2017-02-02 (×7): qty 1

## 2017-02-02 MED ORDER — QUETIAPINE FUMARATE 50 MG PO TABS
50.0000 mg | ORAL_TABLET | Freq: Every evening | ORAL | Status: DC | PRN
  Administered 2017-02-04: 50 mg
  Filled 2017-02-02: qty 1

## 2017-02-02 NOTE — Progress Notes (Signed)
Patient information reviewed and entered into eRehab system by Mishell Donalson, RN, CRRN, PPS Coordinator.  Information including medical coding and functional independence measure will be reviewed and updated through discharge.    

## 2017-02-02 NOTE — Progress Notes (Signed)
Initial Nutrition Assessment  DOCUMENTATION CODES:   Not applicable  INTERVENTION:  Increase Glucerna 1.2 formula via Cortrak NGT by 10 ml every 4 hours to new goal rate of 70 ml/hr x 20 hours (may hold TF for up to 4 hours for therapy) with 30 ml Prostat BID to provide 1880 kcal (100% of needs), 114 grams of protein, and 1134 ml of free water.  Once IV fluids are discontinued, recommend free water flushes of 125 ml QID.  RD to continue to monitor.   NUTRITION DIAGNOSIS:   Increased nutrient needs related to acute illness as evidenced by estimated needs.  GOAL:   Patient will meet greater than or equal to 90% of their needs  MONITOR:   TF tolerance, Weight trends, Labs, I & O's, Skin  REASON FOR ASSESSMENT:    Consult Enteral/tube feeding initiation and management  ASSESSMENT:   [name] with history of hypertension, diabetes mellitus. Presented 01/10/2017 after being found down from a supposed fall. CT of the head and imaging revealed intracranial hemorrhage with a 4.5 cm right frontal intraparenchymal hemorrhage, 6 mm right subdural hematoma and other scattered areas of intraparenchymal and subarachnoid hemorrhage with right to left midline shift. Nondisplaced vertical fracture through the left temporal bone.  Underwent right frontal temporal parietal craniectomy for hematoma evacuation 01/14/2017 per Dr. Mikal Plane with placement of bone flap in the abdomen.  Nasogastric tube in place for nutritional support patient remains NPO  Pt was asleep during time of visit and very slow and difficult to awake. Family member at beside. He reports pt has been tolerating his tube feeding fine with no other difficulties. Pt currently has Glucerna 1.2 formula infusing at 55 ml/hr x 20 hours with 30 ml Prostat BID which is providing 1520 kcal (84% of kcal needs), 96 grams of protein (96% of protein needs), and 891 ml of free water. RD to modify tube feeds to  better meet nutrition needs. Noted IV fluids are infusing. Recommend providing free water flushes once IV fluids are discontinued. Question accuracy of most recent weight recorded as weight yesterday was 144 lbs. RD to continue to monitor.   Nutrition-Focused physical exam completed. Findings are no fat depletion, mild muscle depletion, and mild edema.   Labs and medications reviewed.   Diet Order:  Diet NPO time specified  Skin:   (Incision on head R)  Last BM:  3/31  Height:   Ht Readings from Last 1 Encounters:  02/01/17  (1.575 m)    Weight:   Wt Readings from Last 1 Encounters:  02/02/17 130 lb (59 kg)    Ideal Body Weight:  53.6 kg  BMI:  Body mass index is 23.78 kg/m.  Estimated Nutritional Needs:   Kcal:  1800-2000  Protein:  100-115 grams  Fluid:  >/= 1.8 L/day  EDUCATION NEEDS:   No education needs identified at this time  Roslyn Smiling, MS, RD, LDN Pager # [phone] After hours/ weekend pager # [phone]

## 2017-02-02 NOTE — Progress Notes (Signed)
Orthopedic Tech Progress Note Patient Details:  [facility]ise [facility] [birth year] [id number] Brace completed by [facility] Patient ID: Loni Beckwith, male   DOB: Dec 06, 1965, 51 y.o.   MRN: [id number]   Jennye Moccasin 02/02/2017, 8:37 PM

## 2017-02-02 NOTE — Progress Notes (Signed)
[name], MD Physician Signed Physical Medicine and Rehabilitation  Consult Note Date of Service: 01/21/2017 8:19 AM  Related encounter: ED to Hosp-Admission (Discharged) from 01/10/2017 in [facility] 62[facility] NEURO MEDICAL     Expand All Collapse All   Hide copied text Hover for attribution information      Physical Medicine and Rehabilitation Consult Reason for Consult:TBI/right frontal intraparenchymal hemorrhage/SDH/SAH Referring Physician: Trauma Berg   HPI: [name] is a [name] with history of diabetes mellitus. Per chart review patient lives with sister was independent prior to admission. Presented 01/10/2017 fter being found down from a supposed fall. CT of the head and imaging revealed intracranial hemorrhage with a 4.5 cm right frontal intraparenchymal hemorrhage, 6 mm right subdural hematoma and other scattered areas of intraparenchymal and subarachnoid hemorrhage with right to left midline shift. Nondisplaced vertical fracture through the left temporal bone.CT cervical spine negative.CT abdomen and pelvis with no evidence of acute injury.UDS negative.neurosurgery Dr. Marikay Alar advised conservative care with ICP monitor.follow-up ENT Dr. Jenne Pane for temporal bone fracture no surgical intervention required.follow-up CT imaging showed increased right to left shift greater than 1 cm. Basal cisterns slightly effaced.Patient was steady rise in his ICP and not responsive to heavy sedation. .Underwent right frontal temporal parietal craniectomy for hematoma evacuation 01/14/2017 per Dr. Mikal Plane with placement of bone flap in the abdomen. Keppra for seizure prophylaxis.Patient was extubated 01/19/2017. Later placed on subcutaneous heparin for DVT prophylaxis. Bouts of urinary retention with Urecholine added. Physical therapy evaluation completed with recommendations of physical medicine rehabilitation  consult.   Review of Systems  Unable to perform ROS: Acuity of condition       Past Medical History:  Diagnosis Date  . Diabetes mellitus without complication [name])         Past Surgical History:  Procedure Laterality Date  . CRANIOTOMY Right 01/13/2017   Procedure: 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, Placement of bone flap in abdomen;  Surgeon: Coletta Memos, MD;  Location: [facility] OR;  Service: Neurosurgery;  Laterality: Right;        Family History  Problem Relation Age of Onset  . Diabetes Mother   . Diabetes Father    Social History:  reports that he has never smoked. He has never used smokeless tobacco. He reports that he drinks alcohol. He reports that he does not use drugs. Allergies: No Known Allergies       Medications Prior to Admission  Medication Sig Dispense Refill  . metFORMIN (GLUCOPHAGE) 500 MG tablet Take 500 mg by mouth 2 (two) times daily with a meal.    . aspirin EC 81 MG tablet Take 1 tablet (81 mg total) by mouth daily. (Patient not taking: Reported on 01/11/2017) 90 tablet 3  . atorvastatin (LIPITOR) 40 MG tablet Take 1 tablet (40 mg total) by mouth daily at 6 PM. (Patient not taking: Reported on 01/11/2017) 90 tablet 0  . glipiZIDE (GLUCOTROL) 5 MG tablet Take 1 tablet (5 mg total) by mouth daily before breakfast. (Patient not taking: Reported on 01/11/2017) 90 tablet 1  . Linagliptin-Metformin HCl 2.03-999 MG TABS Take 1 tablet by mouth 2 (two) times daily. With food (Patient not taking: Reported on 01/11/2017) 180 tablet 1  . lisinopril (PRINIVIL,ZESTRIL) 2.5 MG tablet Take 1 tablet (2.5 mg total) by mouth daily. (Patient not taking: Reported on 01/11/2017) 90 tablet 0  . naproxen (NAPROSYN) 500 MG tablet Take 1 tablet (500 mg total)  by mouth 2 (two) times daily with a meal. (Patient not taking: Reported on 01/11/2017) 30 tablet 0    Home: Home Living Family/patient expects to be discharged to:: Private  residence Living Arrangements: Other relatives (Sister) Available Help at Discharge: Family, Available 24 hours/day Type of Home: House Home Access: Level entry Home Layout: Two level, Able to live on main level with bedroom/bathroom, Other (Comment) (Shower upstairs) Alternate Level Stairs-Number of Steps: 15 Alternate Level Stairs-Rails: Left Bathroom Shower/Tub: Tub/shower unit Home Equipment: None Additional Comments: Pt only Spanish-speaking. Sister reports she plans to have pt live with her upon d/c, as she will be available for 24/7 assist.   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 Functional Status:  Mobility: Bed Mobility Overal bed mobility: Needs Assistance Bed Mobility: Supine to Sit, Sit to Supine Supine to sit: Total assist Sit to supine: Total assist  ADL:  Cognition: Cognition Overall Cognitive Status: Impaired/Different from baseline Arousal/Alertness: Lethargic Orientation Level: Other (comment) ([place]) Attention: Focused Focused Attention: Impaired Focused Attention Impairment: Verbal basic, Functional basic Problem Solving: Impaired Problem Solving Impairment: Functional basic Safety/Judgment: Impaired [place] [id number] Scales of Cognitive Functioning: Generalized response (some emerging III) Cognition Arousal/Alertness: Lethargic Behavior During Therapy: Flat affect Overall Cognitive Status: Impaired/Different from baseline Area of Impairment: Attention, Following commands, Awareness, Problem solving, [place] level, Orientation Orientation Level: Disoriented to Current Attention Level: Focused Following Commands: Follows one step commands inconsistently Awareness: Intellectual Problem Solving: Slow processing, Decreased initiation, Requires verbal cues, Requires tactile cues General Comments: Decreased ability to participate with  therapy today, requiring max multimodal cues to open eyes; inconsistently follows one-step commands to open eyes and look at therapist. Able to visually track therapist to L-side and midline; intermittent tracking to R-side. Nonpurposeful response with fingers to painful stimulus in BUE; no response in BLE.   Blood pressure 137/70, pulse 91, temperature 97.7 F (36.5 C), temperature source Axillary, resp. rate 17, height  (1.549 m), weight 63 kg (138 lb 14.2 oz), SpO2 96 %. Physical Exam  Vitals reviewed. HENT:  Craniotomy site clean and dry  Eyes:  Pupils sluggish to light  Neck: Normal range of motion. Neck supple. No thyromegaly present.  Cardiovascular: Normal rate and regular rhythm.   Respiratory: Effort normal and breath sounds normal. No respiratory distress.  GI: Soft. Bowel sounds are normal. He exhibits no distension.  Neurological:  Patient very lethargic with fall back asleep during exam. Made minimal eye contact with examiner. He did not follow commands and remained nonverbal. Moves all 4 limbs but inconsistent due to arousal. Sensed more pain actually in LUE today.     Lab Results Last 24 Hours       Results for orders placed or performed during the hospital encounter of 01/10/17 (from the past 24 hour(s))  Glucose, capillary     Status: Abnormal   Collection Time: 01/20/17 11:46 AM  Result Value Ref Range   Glucose-Capillary 225 (H) 65 - 99 mg/dL   Comment 1 Notify RN    Comment 2 Document in Chart   Glucose, capillary     Status: Abnormal   Collection Time: 01/20/17  3:22 PM  Result Value Ref Range   Glucose-Capillary 192 (H) 65 - 99 mg/dL  Glucose, capillary     Status: Abnormal   Collection Time: 01/20/17  7:52 PM  Result Value Ref Range   Glucose-Capillary 171 (H) 65 - 99 mg/dL  Glucose, capillary     Status: Abnormal   Collection Time: 01/20/17 11:11 PM  Result Value Ref Range   Glucose-Capillary 194 (H) 65 - 99 mg/dL  Basic metabolic  panel     Status: Abnormal   Collection Time: 01/21/17  5:00 AM  Result Value Ref Range   Sodium 138 135 - 145 mmol/L   Potassium 3.8 3.5 - 5.1 mmol/L   Chloride 103 101 - 111 mmol/L   CO2 26 22 - 32 mmol/L   Glucose, Bld 157 (H) 65 - 99 mg/dL   BUN 17 6 - 20 mg/dL   Creatinine, Ser 0.98 (L) 0.61 - 1.24 mg/dL   Calcium 7.8 (L) 8.9 - 10.3 mg/dL   GFR calc non Af Amer >60 >60 mL/min   GFR calc Af Amer >60 >60 mL/min   Anion gap 9 5 - 15  Glucose, capillary     Status: Abnormal   Collection Time: 01/21/17  5:12 AM  Result Value Ref Range   Glucose-Capillary 153 (H) 65 - 99 mg/dL  Glucose, capillary     Status: Abnormal   Collection Time: 01/21/17  7:58 AM  Result Value Ref Range   Glucose-Capillary 139 (H) 65 - 99 mg/dL   Comment 1 Notify RN    Comment 2 Document in Chart       Imaging Results (Last 48 hours)  Dg Abd Portable 1v  Result Date: 01/19/2017 CLINICAL DATA:  Check for feeding catheter placement EXAM: PORTABLE ABDOMEN - 1 VIEW COMPARISON:  01/17/2017 FINDINGS: Previously seen nasogastric catheter has been removed and a feeding catheter has been placed in lies within the junction of the third and fourth portions of the duodenum just short of the ligamenta trans. Scattered large and small bowel gas is noted. No obstructive changes are seen. No bony abnormality is noted. Rounded density is noted over the right mid abdomen likely extrinsic to the patient. Clinical correlation is recommended. IMPRESSION: Feeding catheter within the distal duodenum as described. Vague rounded density overlying the right mid abdomen laterally likely related to extrinsic artifact. Clinical correlation is recommended. Electronically Signed   By: Alcide Clever M.D.   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 Does the need for close, 24 hr/day medical supervision in concert with the patient's rehab needs make it unreasonable for this patient  to be served in a less intensive setting? Yes 2. Co-Morbidities requiring supervision/potential complications: dm2, post-tbi sequelae 3. Due to bladder management, bowel management, safety, skin/wound care, disease management, medication administration, pain management and patient education, does the patient require 24 hr/day rehab nursing? Yes 4. Does the patient require coordinated care of a physician, rehab nurse, PT (1-2 hrs/day, 5 days/week), OT (1-2 hrs/day, 5 days/week) and SLP (1-2 hrs/day, 5 days/week) to address physical and functional deficits in the context of the above medical diagnosis(es)? Yes Addressing deficits in the following areas: balance, endurance, locomotion, strength, transferring, bowel/bladder control, bathing, dressing, feeding, grooming, toileting, cognition, speech, language, swallowing and psychosocial support 5. Can the patient actively participate in an intensive therapy program of at least 3 hrs of therapy per day at least 5 days per week? Yes and Potentially 6. The potential for patient to make measurable gains while on inpatient rehab is good 7. Anticipated functional outcomes upon discharge from inpatient rehab are modified independent and supervision  with PT, modified independent, supervision and min assist with OT, supervision and min assist with SLP. 8. Estimated rehab length  of stay to reach the above functional goals is: potentially 18-25 days 9. Does the patient have adequate social supports and living environment to accommodate these discharge functional goals? Yes and Potentially 10. Anticipated D/C setting: Home 11. Anticipated post D/C treatments: HH therapy and Outpatient therapy 12. Overall Rehab/Functional Prognosis: excellent  RECOMMENDATIONS: This patient's condition is appropriate for continued rehabilitative care in the following setting: CIR Patient has agreed to participate in recommended program. N/A Note that insurance prior authorization  may be required for reimbursement for recommended care.  Comment: Rehab Admissions Coordinator to follow up.  Thanks,  [name], MD, [place] [facility]    Charlton Amor., PA-C

## 2017-02-02 NOTE — Progress Notes (Signed)
[place] PHYSICAL MEDICINE & REHABILITATION     PROGRESS NOTE    Subjective/Complaints: Restless last night. Received ativan. Slow to awaken today  ROS: Unable to obtain due to cognitive/mental status issues.   Objective: Vital Signs: Blood pressure 134/77, pulse 97, temperature 97.6 F (36.4 C), temperature source Axillary, resp. rate 17, height  (1.575 m), weight 59 kg (130 lb), SpO2 96 %. No results found.  Recent Labs  02/01/17 1714 02/02/17 0447  WBC 7.9 5.8  HGB 9.3* 9.9*  HCT 28.5* 29.6*  PLT 393 402*    Recent Labs  02/01/17 1714 02/02/17 0447  NA  --  138  K  --  3.8  CL  --  106  GLUCOSE  --  169*  BUN  --  15  CREATININE 0.44* 0.45*  CALCIUM  --  8.4*   CBG (last 3)   Recent Labs  02/02/17 0018 02/02/17 0413 02/02/17 0743  GLUCAP 144* 150* 163*    Wt Readings from Last 3 Encounters:  02/02/17 59 kg (130 lb)  02/01/17 65.6 kg (144 lb 9.6 oz)  07/31/16 68 kg (150 lb)    Physical Exam:    Gen: pt with eyes open, slow to respond HENT:  Craniotomy site clean and dry. Nasogastric tube in place Eyes:  Pupils reactive to light Neck: Normal range of motion. Neck supple. No thyromegalypresent.  Cardiovascular: RRR.  Respiratory:  Lungs clear, limited inspiration GI: Soft. Bowel sounds are normal. He exhibits no distension. Flap site intact Neurological: He is alert.  Patient is awake, less alert, very distracted.   Difficulty initiating any purposeful movement or speech. Doesn't follow any basic commands today. Psychiatric:    distractedand flat Skin. Warm and dry  Assessment/Plan: 1. Cognitive and functional deficits secondary to right traumatic ICH/SDH s/p craniectomy which require 3+ hours per day of interdisciplinary therapy in a comprehensive inpatient rehab setting. Physiatrist is providing close team supervision and 24 hour management of active medical problems listed below. Physiatrist and rehab team continue to assess  barriers to discharge/monitor patient progress toward functional and medical goals.  Function:  Bathing Bathing position      Bathing parts      Bathing assist        Upper Body Dressing/Undressing Upper body dressing                    Upper body assist        Lower Body Dressing/Undressing Lower body dressing                                  Lower body assist        Toileting Toileting          Toileting assist     Transfers Chair/bed transfer             Locomotion Ambulation           Wheelchair          Cognition Comprehension Comprehension assist level: Understands basic less than 25% of the time/ requires cueing >75% of the time  Expression Expression assist level: Expresses basis less than 25% of the time/requires cueing >75% of the time.  Social Interaction Social Interaction assist level: Interacts appropriately less than 25% of the time. May be withdrawn or combative.  Problem Solving Problem solving assist level: Solves basic less than 25% of the time - needs  direction nearly all the time or does not effectively solve problems and may need a restraint for safety  Memory Memory assist level: Recognizes or recalls less than 25% of the time/requires cueing greater than 75% of the time  Medical Problem List and Plan: 1. Decreased functional mobility with cognitive deficitssecondary to right IPH/SDH/temporal bone fracture after fall S/P right frontal temporoparietal craniectomy 01/14/2017 with placement of bone flap in the abdomen -begin CIR therapies 2. DVT Prophylaxis/Anticoagulation: Subcutaneous heparin. Monitor platelet counts and any signs of bleeding. Check vascular study 3. Pain Management: Hycet  4. Mood: Ativan as needed 5. Neuropsych: This patient is notcapable of making decisions on hisown behalf.  -sleep chart  -seroquel for sleep/agitation  -limit benzos  -trial of ritalin for agitation 6.  Skin/Wound Care: Routine skin checks  -helmet for cranium protection  -flap site intact 7. Fluids/Electrolytes/Nutrition: NGT for nutrition 8.Seizure prophylaxis. Keppra 750 mg twice daily 9.Pneumonia. Five-day course of Ancef to completed 01/22/2017 10.Dysphagia. CorTrak nasogastric tube for a traditional support. Follow-up speech therapy for dysphagia 11.Urinary retention/Escherichia coli UTI. Hold Urecholine.Complete course of Cipro.Check PVR 3  -condom cath 12.Diabetes mellitus with peripheral neuropathy. Hemoglobin A1c 10.4. Lantus insulin 15 units twice a day. Patient on Glucophage 500 mg twice a day and Glucotrol 5 mg daily prior to admission. Resume as needed  -fair control at present 13.History of hypertension. Patient on lisinopril 2.5 mg daily prior to admission. Resume as needed 14.Constipation. Laxative assistance  LOS (Days) 1 A FACE TO FACE EVALUATION WAS PERFORMED  [name], MD 02/02/2017 9:43 AM

## 2017-02-02 NOTE — Plan of Care (Signed)
Problem: RH Ambulation Goal: LTG Patient will ambulate in controlled environment (PT) LTG: Patient will ambulate in a controlled environment, # of feet with assistance (PT). 50 ft Goal: LTG Patient will ambulate in home environment (PT) LTG: Patient will ambulate in home environment, # of feet with assistance (PT). 25 ft  Problem: RH Wheelchair Mobility Goal: LTG Patient will propel w/c in controlled environment (PT) LTG: Patient will propel wheelchair in controlled environment, # of feet with assist (PT) 50 ft   Problem: RH Stairs Goal: LTG Patient will ambulate up and down stairs w/assist (PT) LTG: Patient will ambulate up and down # of stairs with assistance (PT) 4 steps with B rails for strengthening with PT  Problem: RH Attention Goal: LTG Patient will demonstrate focused/sustained (PT) LTG:  Patient will demonstrate focused/sustained/selective/alternating/divided attention during functional mobility in specific environment with assist for # of minutes  (PT) 5 minutes

## 2017-02-02 NOTE — Progress Notes (Signed)
**  Preliminary report by tech**  Bilateral lower extremity venous duplex complete. There is evidence of acute deep vein thrombosis involving the peroneal veins of the right and left lower extremities. There is no evidence of superficial vein thrombosis involving the right and left lower extremities. There is no evidence of Baker's cyst on the right or left. Results were given to the patient's nurse, Misty Stanley.  02/02/17 4:44 PM Olen Cordial RVT

## 2017-02-02 NOTE — Progress Notes (Signed)
Trish Mage, RN Rehab Admission Coordinator Signed Physical Medicine and Rehabilitation  PMR Pre-admission Date of Service: 02/01/2017 11:31 AM  Related encounter: ED to Hosp-Admission (Discharged) from 01/10/2017 in [facility] 19M NEURO MEDICAL       Hide copied text PMR Admission Coordinator Pre-Admission Assessment  Patient: [name] is an 51 y.o., male MRN: [id number] DOB: 09/27/1966 Height:  (154.9 cm) Weight: 65.6 kg (144 lb 9.6 oz)                                                                                                                                                  Insurance Information Self pay - no insurance  Medicaid Application Date:        Case Manager:   Disability Application Date:        Case Worker:    Emergency Actuary Information    Name Relation Home Work Mobile   [place] Sister [phone]       Current Medical History  Patient Admitting Diagnosis: Right IPH/SDH after fall   History of Present Illness: A 51 y.o.right handedlimited English-speaking malewith history of hypertension,diabetes mellitus. Per chart review patient lives with sister was independent prior to admission. Plan is for sister and brother-in-law to provide assistance as needed at time of discharge.Presented 01/10/2017 after being found down from a supposed fall. CT of the head and imaging revealed intracranial hemorrhage with a 4.5 cm right frontal intraparenchymal hemorrhage, 6 mm right subdural hematoma and other scattered areas of intraparenchymal and subarachnoid hemorrhage with right to left midline shift. Nondisplaced vertical fracture through the left temporal bone. CT cervical spine negative.CT abdomen and pelvis with no evidence of acute injury.UDS negative.neurosurgery Dr. Marikay Alar advised conservative care with ICP monitor.follow-up ENT Dr. Jenne Pane for temporal bone fracture no surgical  intervention required.follow-up CT imaging showed increased right to left shift greater than 1 cm. Basal cisterns slightly effaced.Patient withsteady rise in his ICP and not responsive to heavy sedation. .Underwent right frontal temporal parietal craniectomy for hematoma evacuation 01/14/2017 per Dr. Mikal Plane with placement of bone flap in the abdomen. Keppra for seizure prophylaxis.Patient was extubated 01/19/2017. Nasogastric tube in place for nutritional support patient remains NPO.Later placed on subcutaneous heparin for DVT prophylaxis. Placed on Ancef 01/18/2017 for suspected pneumonia 5 days.Bouts of urinary retention with urecholine addedas well as greater than 100,000 Escherichia coli UTI placed on Cipro 01/28/2017. Physical therapy evaluation completed with recommendations of physical medicine rehabilitation consult.  Patient to be admitted for a comprehensive inpatient rehabilitation program.   Total: 7=NIH  Past Medical History      Past Medical History:  Diagnosis Date  . Diabetes mellitus without complication (HCC)     Family History  family history includes Diabetes in his father and mother.  Prior Rehab/Hospitalizations:  No previous rehab admissions.  Has the patient had major surgery during 100 days prior to admission? No  Current Medications   Current Facility-Administered Medications:  .  0.9 %  sodium chloride infusion, , Intravenous, Continuous, Jimmye Norman, MD, Last Rate: 50 mL/hr at 01/31/17 0457 .  acetaminophen (TYLENOL) solution 650 mg, 650 mg, Per Tube, Q4H PRN, Joaquim Nam, RPH, 650 mg at 01/28/17 1854 .  acetaminophen (TYLENOL) suppository 650 mg, 650 mg, Rectal, Q4H PRN, Almond Lint, MD, 650 mg at 01/27/17 0959 .  acetaminophen (TYLENOL) tablet 650 mg, 650 mg, Oral, Q4H PRN, Almond Lint, MD, 650 mg at 01/29/17 1550 .  bethanechol (URECHOLINE) tablet 10 mg, 10 mg, Per Tube, TID, Joaquim Nam, RPH, 10 mg at 02/01/17 0902 .  chlorhexidine (PERIDEX)  0.12 % solution 15 mL, 15 mL, Mouth Rinse, BID, [name], MD, 15 mL at 02/01/17 0902 .  Chlorhexidine Gluconate Cloth 2 % PADS 6 each, 6 each, Topical, Daily, [name], MD, 6 each at 02/01/17 [phone] .  pseudoephedrine (SUDAFED) tablet 30 mg, 30 mg, Oral, Q6H PRN **AND** chlorpheniramine (CHLOR-TRIMETON) tablet 4 mg, 4 mg, Oral, Q6H PRN, Edson Snowball, PA-C .  ciprofloxacin (CIPRO) IVPB 400 mg, 400 mg, Intravenous, Q12H, [name], MD, 400 mg at 01/31/17 2049 .  docusate (COLACE) 50 MG/5ML liquid 100 mg, 100 mg, Per Tube, Daily, Axel Filler, MD, Stopped at 02/01/17 [phone] .  feeding supplement (GLUCERNA 1.2 CAL) liquid 1,000 mL, 1,000 mL, Per Tube, Continuous, [name], MD, Last Rate: 55 mL/hr at 01/31/17 0108, 1,000 mL at 01/31/17 0108 .  feeding supplement (PRO-STAT SUGAR FREE 64) liquid 30 mL, 30 mL, Per Tube, BID, [name], MD, 30 mL at 02/01/17 0903 .  guaiFENesin (ROBITUSSIN) 100 MG/5ML solution 200 mg, 10 mL, Per Tube, Q4H PRN, Gaynelle Adu, MD, 200 mg at 01/31/17 1038 .  heparin injection 5,000 Units, 5,000 Units, Subcutaneous, Q8H, Coletta Memos, MD, 5,000 Units at 02/01/17 0429 .  hydrALAZINE (APRESOLINE) injection 10 mg, 10 mg, Intravenous, Q2H PRN, Almond Lint, MD .  HYDROcodone-acetaminophen (HYCET) 7.5-325 mg/15 ml solution 10 mL, 10 mL, Per Tube, Q4H PRN, [name], MD, 10 mL at 01/31/17 2359 .  insulin aspart (novoLOG) injection 0-15 Units, 0-15 Units, Subcutaneous, Q4H, Almond Lint, MD, 2 Units at 02/01/17 0429 .  insulin glargine (LANTUS) injection 15 Units, 15 Units, Subcutaneous, BID, Jimmye Norman, MD, 15 Units at 02/01/17 [phone] .  ipratropium-albuterol (DUONEB) 0.5-2.5 (3) MG/3ML nebulizer solution 3 mL, 3 mL, Nebulization, Q6H PRN, [name], MD, 3 mL at 01/29/17 1911 .  labetalol (NORMODYNE,TRANDATE) injection 10-40 mg, 10-40 mg, Intravenous, Q10 min PRN, Coletta Memos, MD .  levETIRAcetam (KEPPRA) 100 MG/ML solution 750 mg, 750 mg, Oral,  BID, Berna Bue, MD, 750 mg at 02/01/17 0903 .  LORazepam (ATIVAN) injection 1 mg, 1 mg, Intravenous, Q2H PRN, Rodman Pickle, MD, 1 mg at 01/30/17 8119 .  magnesium hydroxide (MILK OF MAGNESIA) suspension 30 mL, 30 mL, Per Tube, Daily, Joaquim Nam, RPH, 30 mL at 02/01/17 0902 .  MEDLINE mouth rinse, 15 mL, Mouth Rinse, q12n4p, [name], MD, 15 mL at 01/31/17 1200 .  multivitamin liquid 15 mL, 15 mL, Per Tube, Daily, [name], MD, 15 mL at 02/01/17 0903 .  naloxone [facility]) injection 0.08 mg, 0.08 mg, Intravenous, PRN, Coletta Memos, MD .  ondansetron (ZOFRAN) tablet 4 mg, 4 mg, Oral, Q4H PRN **OR** ondansetron (ZOFRAN) injection 4 mg, 4 mg, Intravenous, Q4H PRN, Ronaldo Miyamoto  Cabbell, MD .  pantoprazole sodium (PROTONIX) 40 mg/20 mL oral suspension 40 mg, 40 mg, Per Tube, Daily, Gaynelle Adu, MD, 40 mg at 02/01/17 0902 .  promethazine (PHENERGAN) tablet 12.5-25 mg, 12.5-25 mg, Oral, Q4H PRN, Coletta Memos, MD .  senna (SENOKOT) tablet 8.6 mg, 1 tablet, Per Tube, Daily, Axel Filler, MD, 8.6 mg at 01/31/17 1030 .  sodium chloride flush (NS) 0.9 % injection 10-40 mL, 10-40 mL, Intracatheter, PRN, [name], MD, 10 mL at 01/31/17 1222  Patients Current Diet: Diet NPO time specified  Precautions / Restrictions Precautions Precautions: Fall Precaution Comments: Bone flap removed on R; NG tube Restrictions Weight Bearing Restrictions: No   Has the patient had 2 or more falls or a fall with injury in the past year?Yes.  Had a fall recently PTA.  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  Home Assistive Devices / Equipment Home Assistive Devices/Equipment: None Home Equipment: None  Prior Device Use: Indicate devices/aids used by the patient prior to current illness, exacerbation or injury? None  Prior Functional Level Prior Function Level of Independence: Independent Comments: According to sister, pt lives with  roommate, works as Administrator, and drives.  Self Care: Did the patient need help bathing, dressing, using the toilet or eating?  Independent  Indoor Mobility: Did the patient need assistance with walking from room to room (with or without device)? Independent  Stairs: Did the patient need assistance with internal or external stairs (with or without device)? Independent  Functional Cognition: Did the patient need help planning regular tasks such as shopping or remembering to take medications? Independent  Current Functional Level Cognition  Arousal/Alertness: Lethargic Overall Cognitive Status: Impaired/Different from baseline Current Attention Level: Focused Orientation Level: Disoriented X4 Following Commands: Follows one step commands inconsistently, Follows one step commands with increased time General Comments: Multimodal cues utilized for attention to task today. Tracking therapist and objects L>R. Grabbed washcloth and wiped his mouth when cued that his mouth was wet.  Attention: Focused Focused Attention: Impaired Focused Attention Impairment: Verbal basic, Functional basic Problem Solving: Impaired Problem Solving Impairment: Functional basic Safety/Judgment: Impaired [place] [address] Scales of Cognitive Functioning: Localized response ([place] 12)    Extremity Assessment (includes Sensation/Coordination)  Upper Extremity Assessment: RUE deficits/detail, LUE deficits/detail RUE Deficits / Details: Pt spontaneously moving elbow distally  LUE Deficits / Details: Pt moving Lt UE purposefully   Lower Extremity Assessment: Defer to PT evaluation RLE Deficits / Details: No active movement in RLE; low tone LLE Deficits / Details: No active movement in LLE; low tone    ADLs  Overall ADL's : Needs assistance/impaired Eating/Feeding: NPO Grooming: Sitting, Moderate assistance, Wash/dry face Grooming Details (indicate cue type and reason): Pt requires total A for EOB  sitting.  He will spontaneously wipe mouth and wipe hands, but with decreased thoroughness  Upper Body Bathing: Total assistance, Bed level Lower Body Bathing: Total assistance, Bed level Upper Body Dressing : Total assistance, Bed level Lower Body Dressing: Total assistance, Bed level Toilet Transfer: Total assistance Toileting- Clothing Manipulation and Hygiene: Total assistance, Bed level Functional mobility during ADLs: Total assistance, +2 for physical assistance    Mobility  Overal bed mobility: Needs Assistance Bed Mobility: Supine to Sit, Sit to Supine Supine to sit: Total assist, +2 for physical assistance Sit to supine: Total assist, +2 for physical assistance General bed mobility comments: Pt did not attempt to assist     Transfers  Overall transfer level: Needs assistance  Equipment used: 2 person hand held assist Transfers: Sit to/from Stand Sit to Stand: Total assist, +2 physical assistance General transfer comment: Not able to attempt this session    Ambulation / Gait / Stairs / Wheelchair Mobility       Posture / Balance Dynamic Sitting Balance Sitting balance - Comments: Heavy mod to max assist required for sitting EOB. Holding head more upright this session for short bouts with inability to maintain.  Balance Overall balance assessment: Needs assistance Sitting-balance support: Feet supported Sitting balance-Leahy Scale: Poor Sitting balance - Comments: Heavy mod to max assist required for sitting EOB. Holding head more upright this session for short bouts with inability to maintain.  Postural control: Posterior lean    Special needs/care consideration BiPAP/CPAP No CPM No Continuous Drip IV 0.9% NS at 50 mL/hr Dialysis No        Life Vest No Oxygen 2L [place] Special Bed No Trach Size No Wound Vac (area) No    Skin Crani right scalp incision                              Bowel mgmt: Last BM 01/30/17 Bladder mgmt: Condom catheter for urinary  retention Diabetic mgmt Yes, diabetic Restraints:  Mittens on both hands   Previous Home Environment Living Arrangements: Other relatives Available Help at Discharge: Family, Available 24 hours/day Type of Home: House Home Layout: Two level, Able to live on main level with bedroom/bathroom, Other (Comment) Alternate Level Stairs-Rails: Left Alternate Level Stairs-Number of Steps: 15 Home Access: Level entry Bathroom Shower/Tub: Tub/shower unit Home Care Services: No Additional Comments: sister reports she will be primary caregiver at discharge   Discharge Living Setting Plans for Discharge Living Setting: House, Lives with (comment) (Townhouse) Type of Home at Discharge: House Teacher, adult education) Discharge Home Layout: Two level, 1/2 bath on main level, Bed/bath upstairs (Plans to stay in living room/den on main floor.) Alternate Level Stairs-Number of Steps: 15 Discharge Home Access: Level entry Does the patient have any problems obtaining your medications?: Yes (Describe) (Patient with no insurance.)  Social/Family/Support Systems Patient Roles: Other (Comment) (Has a sister and brother-in-law.) Contact Information: [name] - sister Anticipated Caregiver: sister and family Anticipated Caregiver's Contact Information: [name] - sister - [phone] Ability/Limitations of Caregiver: Sister plans to be caregiver, her husband will assist as able. Caregiver Availability: 24/7 Discharge Plan Discussed with Primary Caregiver: Yes Is Caregiver In Agreement with Plan?: Yes Does Caregiver/Family have Issues with Lodging/Transportation while Pt is in Rehab?: No  Goals/Additional Needs Patient/Family Goal for Rehab: PT mod I and supervision, OT mod I to supervision to min assist, SLP supervision to min assist goals Expected length of stay: 18-25 days Cultural Considerations: Speaks very little [place], speaks [place]. Dietary Needs: NPO with tube feedings Equipment Needs:  TBD Pt/Family Agrees to Admission and willing to participate: Yes (I spoke with patient's sister, [name].) Program Orientation Provided & Reviewed with Pt/Caregiver Including Roles  & Responsibilities: Yes  Decrease burden of Care through IP rehab admission: N/A  Possible need for SNF placement upon discharge: Not planned, sister plans to care for patient at home after rehab.  Patient Condition: This patient's medical and functional status has changed since the consult dated: 01/21/17 in which the Rehabilitation Physician determined and documented that the patient's condition is appropriate for intensive rehabilitative care in an inpatient rehabilitation facility. See "History of Present Illness" (above) for medical update. Functional changes are: Currently requiring total  assist. Patient's medical and functional status update has been discussed with the Rehabilitation physician and patient remains appropriate for inpatient rehabilitation. Will admit to inpatient rehab today.  Preadmission Screen Completed By:  Trish Mage, 02/01/2017 11:31 AM ______________________________________________________________________   Discussed status with Dr. Riley Kill on 02/01/17 at 1150 and received telephone approval for admission today.  Admission Coordinator:  Trish Mage, time 1150/Date 02/01/17       Cosigned by: [name], MD at 02/01/2017 12:10 PM  Revision History

## 2017-02-02 NOTE — Evaluation (Addendum)
Physical Therapy Assessment and Plan  Patient Details  Name: [name] MRN: [id number] Date of Birth: 1966-03-24  PT Diagnosis: Abnormal posture, Abnormality of gait, Coordination disorder, Difficulty walking, Impaired cognition, Impaired sensation and Muscle weakness Rehab Potential: Fair ELOS: 4 weeks   Today's Date: 02/02/2017 PT Individual Time: 1000-1100 and 1305-1406 PT Individual Time Calculation (min): 60 min and 61 min Pt missed 29 minutes of skilled PT treatment 2/2 fatigue.   Problem List:  Patient Active Problem List   Diagnosis Date Noted  . Traumatic intraparenchymal hemorrhage ([place]) 02/01/2017  . Aphasia 02/01/2017  . Dysphagia, oropharyngeal 02/01/2017  . Intracerebral hemorrhage ([place]) 01/14/2017  . Fall 01/10/2017  . Subdural hemorrhage ([place]) 01/10/2017  . Diabetes type 2, uncontrolled ([place]) 06/13/2015    Past Medical History:  Past Medical History:  Diagnosis Date  . Diabetes mellitus without complication [facility])    Past Surgical History:  Past Surgical History:  Procedure Laterality Date  . CRANIOTOMY Right 01/13/2017   Procedure: 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, Placement of bone flap in abdomen;  Surgeon: Ashok Pall, MD;  Location: [place];  Service: Neurosurgery;  Laterality: Right;    Assessment & Plan Clinical Impression: Patient is a 51 y.o. year old male, right handedlimited English-speaking malewith history of hypertension,diabetes mellitus. Per chart review patient lives with sister was independent prior to admission.Plan is for sister and brother-in-law to provide assistance as needed at time of discharge.Presented 01/10/2017 fter being found down from a supposed fall. CT of the head and imaging revealed intracranial hemorrhage with a 4.5 cm right frontal intraparenchymal hemorrhage, 6 mm right subdural hematoma and other scattered areas of intraparenchymal and subarachnoid hemorrhage with right to  left midline shift. Nondisplaced vertical fracture through the left temporal bone.CT cervical spine negative.CT abdomen and pelvis with no evidence of acute injury.UDS negative.neurosurgery Dr. Sherley Bounds advised conservative care with ICP monitor.follow-up ENT Dr. Redmond Baseman for temporal bone fracture no surgical intervention required.follow-up CT imaging showed increased right to left shift greater than 1 cm. Basal cisterns slightly effaced.Patient Withsteady rise in his ICP and not responsive to heavy sedation. .Underwent right frontal temporal parietal craniectomy for hematoma evacuation 01/14/2017 per Dr. Cyndy Freeze with placement of bone flap in the abdomen. Keppra for seizure prophylaxis.Patient was extubated 01/19/2017.Nasogastric tube in place for nutritional support patient remains NPO.Later placed on subcutaneous heparin for DVT prophylaxis. Placed on Ancef 01/18/2017 for suspected pneumonia 5 days.Bouts of urinary retention with Urecholine addedas well as greater than 100,000 Escherichia coli UTI placed on Cipro 01/28/2017. Physical therapy evaluation completed with recommendations of physical medicine rehabilitation consult.patient was admitted for a comprehensive rehabilitation program.  Patient transferred to CIR on 02/01/2017 .   Patient currently requires total with mobility secondary to muscle weakness, decreased cardiorespiratoy endurance, decreased coordination, decreased visual perceptual skills, decreased midline orientation and decreased attention to right, decreased initiation, decreased attention, decreased awareness, decreased problem solving, decreased safety awareness, decreased memory and delayed processing, and decreased sitting balance, decreased standing balance, decreased postural control and decreased balance strategies.  Prior to hospitalization, patient was independent  with mobility and lived with Other (Comment) (sister) in a House home.  Home access is  Level entry.  Patient  will benefit from skilled PT intervention to maximize safe functional mobility, minimize fall risk and decrease caregiver burden for planned discharge home with 24 hour assist.  Anticipate patient will benefit from follow up [facility] at discharge.  PT - End of Session Activity Tolerance: Tolerates 30+ min  activity with multiple rests Endurance Deficit: Yes Endurance Deficit Description: poor endurance PT Assessment Rehab Potential (ACUTE/IP ONLY): Fair PT Patient demonstrates impairments in the following area(s): Balance;Behavior;Endurance;Motor;Pain;Safety;Perception;Sensory PT Transfers Functional Problem(s): Bed Mobility;Bed to Chair;Car PT Locomotion Functional Problem(s): Ambulation;Wheelchair Mobility;Stairs PT Plan PT Intensity: Minimum of 1-2 x/day ,45 to 90 minutes PT Frequency: 5 out of 7 days PT Duration Estimated Length of Stay: 4 weeks PT Treatment/Interventions: Ambulation/gait training;DME/adaptive equipment instruction;Neuromuscular re-education;Psychosocial support;Stair training;UE/LE Strength taining/ROM;Wheelchair propulsion/positioning;Therapeutic Activities;UE/LE Coordination activities;Pain management;Skin care/wound management;Discharge planning;Balance/vestibular training;Cognitive remediation/compensation;Disease management/prevention;Functional mobility training;Patient/family education;Splinting/orthotics;Therapeutic Exercise;Visual/perceptual remediation/compensation PT Transfers Anticipated Outcome(s): min assist PT Locomotion Anticipated Outcome(s): min assist PT Recommendation Recommendations for Other Services: Speech consult Follow Up Recommendations: 24 hour supervision/assistance;Home health PT Patient destination: Home Equipment Recommended: To be determined  Skilled Therapeutic Intervention Treatment 1: Pt received awake in bed with nephew Gerald Stabs) present to observe session. PT evaluation initiated. Provided pt with TIS w/c, and adjusted it, for increased  comfort, and optimal positioning & support. Pt transferred supine>sitting EOB with +2 total assist, requiring total assist to maintain static sitting balance. Pt transferred bed>w/c with +2 total assist via squat pivot. Pt required assistance with turning water on but able to wash face with damp cloth with supervision. Pt transported throughout unit via w/c total assist. Attempted to have pt transfer sit>stand with use of parallel bars but pt unable to hold head up or initiate movement. During session therapist observed pt's L toes moving but otherwise no active movement noted in BLE. Pt with poor attention to R visual field despite max multimodal cuing. At end of session pt left in handoff to SLP.  Treatment 2: Pt received asleep in bed requiring max multimodal cuing to awaken. Pt transferred supine<>sitting EOB & bed<>w/c via squat pivot with +2 total assist. Pt unable to initiate or actively participate in movements. Transported pt to ortho gym for quiet, controlled environment. Pt transferred w/c<>mat table in same manner as noted above. Pt tolerated sitting on EOM ~30 minutes with max assist 2/2 R lateral lean and pt tendency to pull forward on edge of mat table. Pt unable to correct posture despite use of mirror for visual feedback. While sitting EOM attempted to engage pt in task to address ability to follow one-step commands. Pt requires max multimodal cuing and significantly extra time to attend to and complete task. Pt's B eyes do not track together and he appears to [facility] for objects. Attempted to transfer sit<>stand (3-muskateers method) with use of mirror for feedback but pt unable to initiate or participate in task and minimal activation noted in BLE. Sitting EOM pt began to feel clammy and was returned to room; BP = 96/77 mmHg, HR = 108 bpm. Pt assisted back into bed & was left with BUE mittens donned, all needs within reach, bed alarm set, & sister present.  Pt's nephew Gerald Stabs) present to  observe session. During session pt reported pain when attempting to stand but unable to point or locate where; per RN pt premedicated prior to session.  Professional interpreter present for both sessions on this date.   PT Evaluation Precautions/Restrictions Precautions Precautions: Fall Precaution Comments: NPO (NG tube), BUE mittens, bone flap removed R side of head Restrictions Weight Bearing Restrictions: No  General Chart Reviewed: Yes Additional Pertinent History: HTN, DM Response to Previous Treatment: Patient unable to report, no changes reported from family or staff Family/Caregiver Present: Yes (nephew Gerald Stabs) but did not provide information)  Pain Pain Assessment Pain Assessment: Faces Faces Pain Scale:  No hurt  Home Living/Prior Functioning Home Living Available Help at Discharge: Family;Available 24 hours/day Type of Home: House Home Access: Level entry Home Layout: Two level;Able to live on main level with bedroom/bathroom (family plans for pt to stay in den/living room on main level) Alternate Level Stairs-Number of Steps: 15 Alternate Level Stairs-Rails: Left  Lives With: Other (Comment) (sister) Prior Function Level of Independence: Independent with basic ADLs;Independent with gait;Independent with transfers  Able to Take Stairs?: Yes Driving: Yes  Vision/Perception  Unable to assess vision 2/2 cognitive impairments. Decreased attention to R visual field.  Cognition Overall Cognitive Status: Impaired/Different from baseline Orientation Level: Oriented to person;Oriented to place (able to state name & "hospital") Attention: Sustained;Focused Focused Attention: Impaired Sustained Attention: Impaired Memory: Impaired Awareness: Impaired Awareness Impairment: Intellectual impairment;Emergent impairment;Anticipatory impairment Problem Solving: Impaired Problem Solving Impairment: Functional basic;Verbal basic Safety/Judgment: Impaired [place]  Scales of Cognitive Functioning: confused/agitated (no agitation noted during evaluation) - Rancho Level IV  Sensation Sensation Light Touch:  (pt reports he can feel therapist touching his BLE) Coordination Gross Motor Movements are Fluid and Coordinated: No Fine Motor Movements are Fluid and Coordinated: No  Motor  Motor Motor - Skilled Clinical Observations: BLE weakness, overall general weakness   Mobility Bed Mobility Bed Mobility: Supine to Sit Supine to Sit: 1: +2 Total assist;HOB elevated Supine to Sit Details: Tactile cues for initiation;Tactile cues for posture;Verbal cues for sequencing;Manual facilitation for placement;Verbal cues for technique;Tactile cues for placement;Tactile cues for sequencing;Tactile cues for weight shifting;Manual facilitation for weight shifting Transfers Transfers: Yes Squat Pivot Transfers: 1: +2 Total assist Squat Pivot Transfer Details: Tactile cues for initiation;Tactile cues for posture;Verbal cues for sequencing;Manual facilitation for placement;Verbal cues for technique;Tactile cues for placement;Tactile cues for sequencing;Tactile cues for weight shifting;Manual facilitation for weight shifting;Verbal cues for precautions/safety  Locomotion  Ambulation Ambulation: No Gait Gait: No Stairs / Additional Locomotion Stairs: No Wheelchair Mobility Wheelchair Mobility: No   Trunk/Postural Assessment  Cervical Assessment Cervical Assessment: Exceptions to [facility] (forward head, cervical flexion, unable to hold head at neutral alignment) Thoracic Assessment Thoracic Assessment:  (kyphosis) Postural Control Postural Control:  (poor postural control)   Balance Balance Balance Assessed: Yes Static Sitting Balance Static Sitting - Balance Support: Bilateral upper extremity supported (pushing posteriorly with BUE supported) Static Sitting - Level of Assistance: 1: +1 Total assist  Extremity Assessment  RLE Assessment RLE Assessment:  Exceptions to WFL (PROM WFL, no active movement noted) LLE Assessment LLE Assessment: Exceptions to [facility] (PROM WFL, pt able to move toes but otherwise no active movement noted in limb)   See Function Navigator for Current Functional Status.   Refer to Care Plan for Long Term Goals  Recommendations for other services: Other: Speech Therapy  Discharge Criteria: Patient will be discharged from PT if patient refuses treatment 3 consecutive times without medical reason, if treatment goals not met, if there is a change in medical status, if patient makes no progress towards goals or if patient is discharged from hospital.  The above assessment, treatment plan, treatment alternatives and goals were discussed and mutually agreed upon: No family available/patient unable  [place] 02/02/2017, 4:59 PM

## 2017-02-02 NOTE — Evaluation (Signed)
Speech Language Pathology Assessment and Plan  Patient Details  Name: [name] MRN: [id number] Date of Birth: 1965/12/28  SLP Diagnosis: Cognitive Impairments  Rehab Potential: Good ELOS: 4 weeks     Today's Date: 02/02/2017 SLP Individual Time: 1100-1145 SLP Individual Time Calculation (min): 45 min  Missed Time: 15 minutes, fatigue    Problem List:  Patient Active Problem List   Diagnosis Date Noted  . Traumatic intraparenchymal hemorrhage ([place]) 02/01/2017  . Aphasia 02/01/2017  . Dysphagia, oropharyngeal 02/01/2017  . Intracerebral hemorrhage ([place]) 01/14/2017  . Fall 01/10/2017  . Subdural hemorrhage ([place]) 01/10/2017  . Diabetes type 2, uncontrolled ([place]) 06/13/2015   Past Medical History:  Past Medical History:  Diagnosis Date  . Diabetes mellitus without complication [facility])    Past Surgical History:  Past Surgical History:  Procedure Laterality Date  . CRANIOTOMY Right 01/13/2017   Procedure: 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, Placement of bone flap in abdomen;  Surgeon: Ashok Pall, MD;  Location: [place];  Service: Neurosurgery;  Laterality: Right;    Assessment / Plan / Recommendation Clinical Impression Patient is a 51 y.o.right handedlimited English-speaking malewith history of hypertension,diabetes mellitus. Per chart review patient lives with sister was independent prior to admission.Plan is for sister and brother-in-law to provide assistance as needed at time of discharge.Presented 01/10/2017 fter being found down from a supposed fall. CT of the head and imaging revealed intracranial hemorrhage with a 4.5 cm right frontal intraparenchymal hemorrhage, 6 mm right subdural hematoma and other scattered areas of intraparenchymal and subarachnoid hemorrhage with right to left midline shift. Nondisplaced vertical fracture through the left temporal bone.CT cervical spine negative.CT abdomen and pelvis with no evidence of  acute injury.UDS negative.neurosurgery Dr. Sherley Bounds advised conservative care with ICP monitor.follow-up ENT Dr. Redmond Baseman for temporal bone fracture no surgical intervention required.follow-up CT imaging showed increased right to left shift greater than 1 cm. Basal cisterns slightly effaced.Patient Withsteady rise in his ICP and not responsive to heavy sedation. .Underwent right frontal temporal parietal craniectomy for hematoma evacuation 01/14/2017 per Dr. Cyndy Freeze with placement of bone flap in the abdomen. Keppra for seizure prophylaxis.Patient was extubated 01/19/2017.Nasogastric tube in place for nutritional support patient remains NPO.Later placed on subcutaneous heparin for DVT prophylaxis. Placed on Ancef 01/18/2017 for suspected pneumonia 5 days.Bouts of urinary retention with Urecholine addedas well as greater than 100,000 Escherichia coli UTI placed on Cipro 01/28/2017. Physical therapy evaluation completed with recommendations of physical medicine rehabilitation consult. Patient was admitted for a comprehensive rehabilitation program on 02/01/17.   Patient demonstrates behaviors consistent with a Rancho Level III and requires total A to complete functional and familiar tasks safely in regards to orientation, following commands, initiation, attention, problem solving, recall and awareness. Patient was essentially nonverbal throughout the session and required Max-Total A for arousal and attention for ~10 second intervals. BSE was not administered today due to lethargy. Patent would benefit from skilled SLP intervention to maximize his cognitive function and overall functional independence prior to discharge.     Skilled Therapeutic Interventions          Administered a cognitive-linguistic evaluation. Please see above for details.   SLP Assessment  Patient will need skilled [place] [facility]ology Services during CIR admission    Recommendations   Oral Care Recommendations: Oral care  QID Patient destination: Home Follow up Recommendations: 24 hour supervision/assistance;Home Health SLP Equipment Recommended: To be determined    SLP Frequency 3 to 5 out of 7 days  SLP Duration  SLP Intensity  SLP Treatment/Interventions 4 weeks   Minumum of 1-2 x/day, 30 to 90 minutes  Cognitive remediation/compensation;Cueing hierarchy;Functional tasks;Patient/family education;Therapeutic Activities;Internal/external aids;Speech/Language facilitation;Environmental controls    Pain Pain Assessment Pain Assessment: Faces Faces Pain Scale: No hurt  Prior Functioning Type of Home: House  Lives With: Family (plans to d/c to sister's home) Available Help at Discharge: Family;Available 24 hours/day  Function:  Cognition Comprehension Comprehension assist level: Understands basic less than 25% of the time/ requires cueing >75% of the time  Expression   Expression assist level: Expresses basis less than 25% of the time/requires cueing >75% of the time.  Social Interaction Social Interaction assist level: Interacts appropriately less than 25% of the time. May be withdrawn or combative.  Problem Solving Problem solving assist level: Solves basic less than 25% of the time - needs direction nearly all the time or does not effectively solve problems and may need a restraint for safety  Memory Memory assist level: Recognizes or recalls less than 25% of the time/requires cueing greater than 75% of the time   Short Term Goals: Week 1: SLP Short Term Goal 1 (Week 1): Patient will initiate a functional task with Max A multimodal cues. SLP Short Term Goal 2 (Week 1): Patient will follow 1 step commands in 25% of opportunities with Max A mutlimodal cues.  SLP Short Term Goal 3 (Week 1): Patient will verbally respond to questions in 25% of opportunities with Max A multimodal cues.   Refer to Care Plan for Long Term Goals  Recommendations for other services: None   Discharge Criteria:  Patient will be discharged from SLP if patient refuses treatment 3 consecutive times without medical reason, if treatment goals not met, if there is a change in medical status, if patient makes no progress towards goals or if patient is discharged from hospital.  The above assessment, treatment plan, treatment alternatives and goals were discussed and mutually agreed upon: No family available/patient unable  [place], [place] 02/02/2017, 3:20 PM

## 2017-02-02 NOTE — Evaluation (Signed)
Occupational Therapy Assessment and Plan  Patient Details  Name: [name] MRN: [id number] Date of Birth: 1966/03/16  OT Diagnosis: abnormal posture, cognitive deficits, disturbance of vision and muscle weakness (generalized) Rehab Potential: Rehab Potential (ACUTE ONLY): Fair ELOS: 4 weeks   Today's Date: 02/02/2017 OT Individual Time: 8546-2703     60 minutes  Problem List:  Patient Active Problem List   Diagnosis Date Noted  . Traumatic intraparenchymal hemorrhage ([place]) 02/01/2017  . Aphasia 02/01/2017  . Dysphagia, oropharyngeal 02/01/2017  . Intracerebral hemorrhage ([place]) 01/14/2017  . Fall 01/10/2017  . Subdural hemorrhage ([place]) 01/10/2017  . Diabetes type 2, uncontrolled ([place]) 06/13/2015    Past Medical History:  Past Medical History:  Diagnosis Date  . Diabetes mellitus without complication [facility])    Past Surgical History:  Past Surgical History:  Procedure Laterality Date  . CRANIOTOMY Right 01/13/2017   Procedure: 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, Placement of bone flap in abdomen;  Surgeon: Ashok Pall, MD;  Location: [place];  Service: Neurosurgery;  Laterality: Right;    Assessment & Plan Clinical Impression: Patient is a 51 y.o. right handedlimited English-speaking malewith history of hypertension,diabetes mellitus. Per chart review patient lives with sister was independent prior to admission.Plan is for sister and brother-in-law to provide assistance as needed at time of discharge.Presented 01/10/2017 fter being found down from a supposed fall. CT of the head and imaging revealed intracranial hemorrhage with a 4.5 cm right frontal intraparenchymal hemorrhage, 6 mm right subdural hematoma and other scattered areas of intraparenchymal and subarachnoid hemorrhage with right to left midline shift. Nondisplaced vertical fracture through the left temporal bone.CT cervical spine negative.CT abdomen and pelvis with no  evidence of acute injury.UDS negative.neurosurgery Dr. Sherley Bounds advised conservative care with ICP monitor.follow-up ENT Dr. Redmond Baseman for temporal bone fracture no surgical intervention required.follow-up CT imaging showed increased right to left shift greater than 1 cm. Basal cisterns slightly effaced.Patient Withsteady rise in his ICP and not responsive to heavy sedation. .Underwent right frontal temporal parietal craniectomy for hematoma evacuation 01/14/2017 per Dr. Cyndy Freeze with placement of bone flap in the abdomen. Keppra for seizure prophylaxis.Patient was extubated 01/19/2017.Nasogastric tube in place for nutritional support patient remains NPO.Later placed on subcutaneous heparin for DVT prophylaxis. Placed on Ancef 01/18/2017 for suspected pneumonia 5 days.Bouts of urinary retention with Urecholine addedas well as greater than 100,000 Escherichia coli UTI placed on Cipro 01/28/2017. Physical therapy evaluation completed with recommendations of physical medicine rehabilitation consult.patient was admitted for a comprehensive rehabilitation program.   Patient transferred to CIR on 02/01/2017 .    Patient currently requires total with basic self-care skills secondary to muscle weakness, decreased cardiorespiratoy endurance, unbalanced muscle activation and decreased coordination, decreased visual perceptual skills, decreased midline orientation and decreased attention to right, decreased initiation, decreased attention, decreased awareness, decreased problem solving, decreased safety awareness, decreased memory and delayed processing and decreased sitting balance, decreased standing balance, decreased postural control and decreased balance strategies.  Prior to hospitalization, patient could complete ADLs with independent .  Patient will benefit from skilled intervention to decrease level of assist with basic self-care skills prior to discharge home with care partner.  Anticipate patient will require  24 hour supervision and minimal physical assistance and follow up home health.  OT - End of Session Activity Tolerance: Decreased this session;Tolerates < 10 min activity, no significant change in vital signs Endurance Deficit: Yes Endurance Deficit Description: lethargic with decreased arousal OT Assessment Rehab Potential (ACUTE ONLY): Fair OT  Patient demonstrates impairments in the following area(s): Balance;Behavior;Cognition;Endurance;Motor;Pain;Perception;Safety;Vision OT Basic ADL's Functional Problem(s): Grooming;Bathing;Dressing;Toileting OT Transfers Functional Problem(s): Toilet;Tub/Shower OT Additional Impairment(s): Fuctional Use of Upper Extremity OT Plan OT Intensity: Minimum of 1-2 x/day, 45 to 90 minutes OT Frequency: 5 out of 7 days OT Duration/Estimated Length of Stay: 4 weeks OT Treatment/Interventions: Balance/vestibular training;Cognitive remediation/compensation;Discharge planning;Disease mangement/prevention;DME/adaptive equipment instruction;Functional mobility training;Neuromuscular re-education;Pain management;Patient/family education;Psychosocial support;Self Care/advanced ADL retraining;Skin care/wound managment;Therapeutic Activities;Therapeutic Exercise;UE/LE Strength taining/ROM;UE/LE Coordination activities;Visual/perceptual remediation/compensation;Wheelchair propulsion/positioning OT Self Feeding Anticipated Outcome(s): No goal OT Basic Self-Care Anticipated Outcome(s): Min assist OT Toileting Anticipated Outcome(s): Min-mod assist OT Bathroom Transfers Anticipated Outcome(s): Min-mod assist OT Recommendation Patient destination: Home Follow Up Recommendations: Home health OT;24 hour supervision/assistance Equipment Recommended: To be determined;3 in 1 bedside comode;Tub/shower bench   Skilled Therapeutic Intervention Limited OT eval completed due to pt lethargy and decreased arousal.  Required +2 for bed mobility, with pt opening eyes with increased  stimulus and change in position.  Total assist for sitting balance due to decreased initiation, attention, and decreased arousal.  Pt unable to following any functional commands to engage in bathing tasks.  Attempted sit > stand with +2, with pt maintaining neck flexion with head down and unable to achieve full upright stance.  Incontinent of urine, requiring +2 for rolling in bed for hygiene.  Hand over hand assist to participate in rolling with pt able to hold on to bed rail but unable to assist in rolling or maintain grasp on bed rail.  Volitional movement of UE to scratch head, but no initiation to follow any commands to engage in grooming.  Returned to bed, total assist to bring both legs into bed and position upright in bed.  Mitts returned to both hands and left semi-reclined in bed due to decreased arousal/lethargy.  OT Evaluation Precautions/Restrictions  Precautions Precautions: Fall Precaution Comments: NPO (NG tube), BUE mittens, bone flap removed R side of head Restrictions Weight Bearing Restrictions: No Pain Pain Assessment Pain Assessment: Faces Faces Pain Scale: No hurt Pain Type: Acute pain Pain Location: Head Pain Orientation: Upper Pain Descriptors / Indicators: Headache Pain Frequency: Intermittent Pain Onset: Gradual Patients Stated Pain Goal: 0 Pain Intervention(s): Medication (See eMAR) Home Living/Prior Functioning Home Living Available Help at Discharge: Family, Available 24 hours/day Type of Home: House Home Access: Level entry Home Layout: Two level, Able to live on main level with bedroom/bathroom (family plans for pt to stay in den/living room on main level) Alternate Level Stairs-Number of Steps: 15 Alternate Level Stairs-Rails: Left Bathroom Shower/Tub: Tub/shower unit  Lives With: Family (plans to d/c to sister's home) Prior Function Level of Independence: Independent with basic ADLs, Independent with gait, Independent with transfers  Able to Take  Stairs?: Yes Driving: Yes Comments: According to chart review, pt lives with roommate, works as Development worker, [employer], and drives. ADL  See Function Navigator Vision/Perception  Vision- Assessment Vision Assessment?: Vision impaired- to be further tested in functional context Additional Comments: Pt kept head down majority of session, tended to close Rt eye, dysconjugate gaze but able to track and locate visual stimulus.  To be further assessed in functional context and as pt more alert  Cognition Overall Cognitive Status: Impaired/Different from baseline Arousal/Alertness: Lethargic Orientation Level: Nonverbal/unable to assess Memory: Impaired Attention: Sustained;Focused Focused Attention: Impaired Sustained Attention: Impaired Awareness: Impaired Awareness Impairment: Intellectual impairment;Emergent impairment;Anticipatory impairment Problem Solving: Impaired Problem Solving Impairment: Functional basic;Verbal basic Safety/Judgment: Impaired Rancho Los Amigos Scales of Cognitive Functioning: Localized response Sensation Sensation Light Touch:  (pt reports  he can feel therapist touching his BLE) Proprioception: Impaired by gross assessment Coordination Gross Motor Movements are Fluid and Coordinated: No Fine Motor Movements are Fluid and Coordinated: No Motor  Motor Motor - Skilled Clinical Observations: BLE weakness, overall general weakness Mobility  Bed Mobility Bed Mobility: Supine to Sit Supine to Sit: 1: +2 Total assist;HOB elevated Supine to Sit Details: Tactile cues for initiation;Tactile cues for posture;Verbal cues for sequencing;Manual facilitation for placement;Verbal cues for technique;Tactile cues for placement;Tactile cues for sequencing;Tactile cues for weight shifting;Manual facilitation for weight shifting  Trunk/Postural Assessment  Cervical Assessment Cervical Assessment: Exceptions to [facility] (forward head, cervical flexion, unable to hold head at neutral  alignment) Thoracic Assessment Thoracic Assessment:  (kyphosis) Postural Control Postural Control:  (poor postural control)  Balance Balance Balance Assessed: Yes Static Sitting Balance Static Sitting - Balance Support: Bilateral upper extremity supported (pushing posteriorly with BUE supported) Static Sitting - Level of Assistance: 1: +1 Total assist Extremity/Trunk Assessment       See Function Navigator for Current Functional Status.   Refer to Care Plan for Long Term Goals  Recommendations for other services: None    Discharge Criteria: Patient will be discharged from OT if patient refuses treatment 3 consecutive times without medical reason, if treatment goals not met, if there is a change in medical status, if patient makes no progress towards goals or if patient is discharged from hospital.  The above assessment, treatment plan, treatment alternatives and goals were discussed and mutually agreed upon: No family available/patient unable  [place], [facility] 02/02/2017, 2:05 PM

## 2017-02-03 ENCOUNTER — Inpatient Hospital Stay (HOSPITAL_COMMUNITY): Payer: Self-pay | Admitting: Speech Pathology

## 2017-02-03 ENCOUNTER — Inpatient Hospital Stay (HOSPITAL_COMMUNITY): Payer: Self-pay | Admitting: Physical Therapy

## 2017-02-03 ENCOUNTER — Inpatient Hospital Stay (HOSPITAL_COMMUNITY): Payer: Self-pay

## 2017-02-03 ENCOUNTER — Inpatient Hospital Stay (HOSPITAL_COMMUNITY): Payer: Self-pay | Admitting: Occupational Therapy

## 2017-02-03 LAB — GLUCOSE, CAPILLARY
GLUCOSE-CAPILLARY: 150 mg/dL — AB (ref 65–99)
Glucose-Capillary: 164 mg/dL — ABNORMAL HIGH (ref 65–99)
Glucose-Capillary: 138 mg/dL — ABNORMAL HIGH (ref 65–99)
Glucose-Capillary: 139 mg/dL — ABNORMAL HIGH (ref 65–99)
Glucose-Capillary: 117 mg/dL — ABNORMAL HIGH (ref 65–99)
Glucose-Capillary: 116 mg/dL — ABNORMAL HIGH (ref 65–99)

## 2017-02-03 MED ORDER — ACETAMINOPHEN 160 MG/5ML PO SOLN
650.0000 mg | Freq: Three times a day (TID) | ORAL | Status: DC
  Administered 2017-02-03 – 2017-02-09 (×21): 650 mg
  Filled 2017-02-03 (×24): qty 20.3

## 2017-02-03 MED ORDER — HYDROCODONE-ACETAMINOPHEN 7.5-325 MG/15ML PO SOLN
5.0000 mL | ORAL | Status: DC | PRN
  Administered 2017-02-04 – 2017-02-08 (×2): 5 mL
  Filled 2017-02-03 (×3): qty 15

## 2017-02-03 NOTE — Patient Care Conference (Signed)
Inpatient RehabilitationTeam Conference and Plan of Care Update Date: 02/02/2017   Time: 2:45 PM    Patient Name: [name] [facility]      Medical Record Number: [id number]  Date of Birth: Oct 13, 1966 Sex: Male         Room/Bed: 4W12C/4W12C-01 Payor Info: Payor: MEDICAID POTENTIAL / Plan: MEDICAID POTENTIAL / Product Type: *No Product type* /    Admitting Diagnosis: trauma fall  R IPH  SDH  [place]  Admit Date/Time:  02/01/2017  4:07 PM Admission Comments: No comment available   Primary Diagnosis:  Traumatic intraparenchymal hemorrhage ([facility]) Principal Problem: Traumatic intraparenchymal hemorrhage [facility])  Patient Active Problem List   Diagnosis Date Noted  . Traumatic intraparenchymal hemorrhage ([facility]) 02/01/2017  . Aphasia 02/01/2017  . Dysphagia, oropharyngeal 02/01/2017  . Intracerebral hemorrhage ([facility]) 01/14/2017  . Fall 01/10/2017  . Subdural hemorrhage ([facility]) 01/10/2017  . Diabetes type 2, uncontrolled ([facility]) 06/13/2015    Expected Discharge Date: Expected Discharge Date:  (TBD/ est 4 wks)  Team Members Present: Physician leading conference: Dr. Faith Rogue Social Worker Present: Amada Jupiter, LCSW Nurse Present: Carmie End, RN PT Present: Aleda Grana, PT OT Present: Roney Mans, OT SLP Present: Feliberto Gottron, SLP PPS Coordinator present : Tora Duck, RN, CRRN     Current Status/Progress Goal Weekly Team Focus  Medical   right TBI with craniectomy. profound cogniitve deficits  improve engagement and interaction with team  restore sleep. improve attention   Bowel/Bladder   incontinent of bowel/bladder. Condom cath at night. LBM 3/31  To be continent of bowel/bladder while in [facility]  Monitor bowel/bladder function q shift and time toileting patient.   Swallow/Nutrition/ Hydration   NPO with NG tube  Eval Pending  Eval Pending    ADL's   +2 total assist overall, decreased arousal on eval  min/mod assist  ADL retraining, attention, following commands, sitting  balance   Mobility   +2 total assist overall  min/mod assist overall  cognitive remediation, transfers, balance, bed mobility, pt/family education   Communication   Total A  Min A  expressing basic wants/needs, following 1 step commands    Safety/Cognition/ Behavioral Observations  Total A  Min A  orientation, following commnads, initiation    Pain   no complain of pain   <2  assess and treat pain q shift and as needed   Skin   R head cremi, stapples remove; OTA. R ABD incision with skin glue, OTA Abrasion on bott with foam dsg.  Skin to be free of infrction/breakdown with mod assist.  assess and treat any skin issues q shift and as needed.    Rehab Goals Patient on target to meet rehab goals: Yes *See Care Plan and progress notes for long and short-term goals.  Barriers to Discharge: severe traumatic injury to brain    Possible Resolutions to Barriers:  continue NMR, and cognitive perceptual rx, family education    Discharge Planning/Teaching Needs:  Plan for pt to d/c home with sister and her family.  Sister to be primary caregiver and aware need for 24/7 support.  Teaching to be ongoing.   Team Discussion:  New eval with significant impairments physically and cognitively.  MD reviewing admission meds and making adjustment.  incont b/b.  No initiation or following commands on eval.  Slightly more alert with PT and sat edge of mat x 30 mins!  Goals overall to be min/ mod assist but will revisit these in next week's conf.  Revisions to Treatment Plan:  NONE   Continued Need for Acute Rehabilitation Level of Care: The patient requires daily medical management by a physician with specialized training in physical medicine and rehabilitation for the following conditions: Daily direction of a multidisciplinary physical rehabilitation program to ensure safe treatment while eliciting the highest outcome that is of practical value to the patient.: Yes Daily medical management of patient  stability for increased activity during participation in an intensive rehabilitation regime.: Yes Daily analysis of laboratory values and/or radiology reports with any subsequent need for medication adjustment of medical intervention for : Post surgical problems;Neurological problems;Mood/behavior problems  Dat Derksen 02/03/2017, 2:25 PM

## 2017-02-03 NOTE — Progress Notes (Signed)
Physical Therapy Session Note  Patient Details  Name: [name] MRN: [id number] Date of Birth: [birth year]  Today's Date: 02/03/2017 PT Individual Time: 1300-1400 AND 1100-1155 PT Individual Time Calculation (min): 60 min AND 57mn   Short Term Goals: Week 1:  PT Short Term Goal 1 (Week 1): Pt will initiate rolling L/R and supine<>sitting with max assist. PT Short Term Goal 2 (Week 1): Pt will transfer bed<>w/c with max assist +1. PT Short Term Goal 3 (Week 1): Pt will demonstrate sustained attention in controlled environment for 2 minutes with supervision.  Skilled Therapeutic Interventions/Progress Updates:   Pt received sitting in WC and agreeable to PT. Interpreter present throughout treatment and patient able to respond in spanish throughout treatment sesion.   One step command following to grab object on table. Increased time and multiple cues required by PT to initiate.   Object identification. Able to properly identify 8 of 9 objects with increased time to initiate. Pt able to self correct with one mistake after one minute delay.  Reaching Differentiating 2 objects; unable to reach for proper object for 4/5 trials.   Sitting tolerance EOM x 5 minutes with max-total assist. Pt spontaneously corrected 3 posterior LOB with mod assist from PT.   Squat pivot transfer with total assist x 2. Pt able to initiate forward lean but demonstrated no active movement in BLE.   Session 2:   Pt received sitting in WC and agreeable to PT  BLE AROM to attempt to kick soccer ball, able to initiate hip flexion and knee flexion on LLE for 10/15 trials and able to perform knee extension/hip flexion to kick ball for 6/15 trials on the RLE. Max multimodal cues throughout with significant increase in time to initiate movement.   Animal figure identificatation. Able to proper name 1 of 5 animals.   One step command following with varied success due to increased frustration and irritation  from NG tube and helmet.   Pt returned to room and performed squat transfer to bed with Total + 2 assist. Sit>supine completed with total +2 assist   Pt Required cleaning following incontinent bowel and bladder episode. Total assist for rolling L and R to preformed perineal hygiene by PT.      PT l eft supine in bed with call bell in reach and all needs met.    Therapy Documentation Precautions:  Precautions Precautions: Fall Precaution Comments: NPO (NG tube), BUE mittens, bone flap removed R side of head Restrictions Weight Bearing Restrictions: No General: PT Amount of Missed Time (min): 30 Minutes PT Missed Treatment Reason: Patient fatigue Vital Signs: Therapy Vitals Temp: 98.4 F (36.9 C) Temp Source: Axillary Pulse Rate: 98 Resp: 18 BP: 101/75 Patient Position (if appropriate): Lying Oxygen Therapy SpO2: 96 % O2 Device: Not Delivered Pain: Faces : Mild pain   See Function Navigator for Current Functional Status.   Therapy/Group: Individual Therapy  ALorie Phenix4/01/2017, 5:21 PM

## 2017-02-03 NOTE — Progress Notes (Signed)
Social Work Patient ID: [name], male   DOB: [birth year], 51 y.o.   MRN: [id number]  Met with pt's sister and her husband yesterday to review team conference.  They understand that team set ELOS for 4 weeks and that their overall goals are still being determined.  Anticipate clearer expectations by team conference next week.  Continue to follow.  Marlene Pfluger, LCSW

## 2017-02-03 NOTE — Progress Notes (Signed)
Occupational Therapy Session Note  Patient Details  Name: [name] MRN: [id number] Date of Birth: [birth year]  Today's Date: 02/03/2017 OT Individual Time: 0800-0900 OT Individual Time Calculation (min): 60 min    Short Term Goals: Week 1:  OT Short Term Goal 1 (Week 1): Pt will sustain attention to self-care task for 2 mins OT Short Term Goal 2 (Week 1): Pt will initiate self-care task with mod cues OT Short Term Goal 3 (Week 1): Pt will complete bathing with max assist OT Short Term Goal 4 (Week 1): Pt will maintain sitting balance with min assist for 5 mins to engage in self-care task OT Short Term Goal 5 (Week 1): Pt will complete toilet transfer with max assist of 1 caregiver  Skilled Therapeutic Interventions/Progress Updates:  1:1 Pt asleep in bed when arrived. Pt required total A to come to EOB to help with arousal.  Pt with decr arousal this session resulting in decr attention to tasks and ability to follow one step commands. Engaged in brushing teeth and washing face EOB.  Total A for brushing teeth with suction and steadying A for washing face with max instructional cues for initiation. Pt transferred into tilt in space w/c with total A +2 with no active assist to help with transfer; despite increased time and environmental cues. Pt engaged in following one step commands and item discrimination between two fields. Pt able to follow commands with tactile cues for initiation and extra time.  Pt able to discriminate between two colors but not three fields. Pt continues to demonstrate disconjugate gaze. Sister present to assist with interpretation and provided music at end of session. Left reclined in chair with hand mits.       Therapy Documentation Precautions:  Precautions Precautions: Fall Precaution Comments: NPO (NG tube), BUE mittens, bone flap removed R side of head Restrictions Weight Bearing Restrictions: No Pain:  shook his head no and no indications   See  Function Navigator for Current Functional Status.   Therapy/Group: Individual Therapy  Roney Mans [facility] 02/03/2017, 10:14 AM

## 2017-02-03 NOTE — Progress Notes (Signed)
[facility] PHYSICAL MEDICINE & REHABILITATION     PROGRESS NOTE    Subjective/Complaints: Had a better night. No new issues this morning  ROS: Limited due cognitive/behavioral    Objective: Vital Signs: Blood pressure 103/71, pulse (!) 101, temperature 98.6 F (37 C), temperature source Axillary, resp. rate 18, height  (1.575 m), weight 60.8 kg (134 lb 1.6 oz), SpO2 95 %. No results found.  Recent Labs  02/01/17 1714 02/02/17 0447  WBC 7.9 5.8  HGB 9.3* 9.9*  HCT 28.5* 29.6*  PLT 393 402*    Recent Labs  02/01/17 1714 02/02/17 0447  NA  --  138  K  --  3.8  CL  --  106  GLUCOSE  --  169*  BUN  --  15  CREATININE 0.44* 0.45*  CALCIUM  --  8.4*   CBG (last 3)   Recent Labs  02/03/17 0008 02/03/17 0416 02/03/17 0759  GLUCAP 150* 164* 138*    Wt Readings from Last 3 Encounters:  02/03/17 60.8 kg (134 lb 1.6 oz)  02/01/17 65.6 kg (144 lb 9.6 oz)  07/31/16 68 kg (150 lb)    Physical Exam:    Gen: pt with eyes open, slow to respond HENT:  Craniotomy site clean and dry. Nasogastric tube in place Eyes:  Pupils reactive to light Neck: Normal range of motion. Neck supple. No thyromegalypresent.  Cardiovascular: RRR.  Respiratory:  Normal effort. Lungs clear GI: Soft. Bowel sounds are normal. He exhibits no distension. Flap site intact Neurological: He is alert.  Patient is awake, remains distracted.   Difficulty initiating any purposeful movement or speech. Doesn't follow any basic commands today. Psychiatric:    distractedand flat Skin. Warm and dry  Assessment/Plan: 1. Cognitive and functional deficits secondary to right traumatic ICH/SDH s/p craniectomy which require 3+ hours per day of interdisciplinary therapy in a comprehensive inpatient rehab setting. Physiatrist is providing close team supervision and 24 hour management of active medical problems listed below. Physiatrist and rehab team continue to assess barriers to  discharge/monitor patient progress toward functional and medical goals.  Function:  Bathing Bathing position      Bathing parts      Bathing assist        Upper Body Dressing/Undressing Upper body dressing                    Upper body assist        Lower Body Dressing/Undressing Lower body dressing                                  Lower body assist        Toileting Toileting          Toileting assist     Transfers Chair/bed transfer   Chair/bed transfer method: Squat pivot Chair/bed transfer assist level: 2 helpers Chair/bed transfer assistive device: Armrests, Bedrails     Locomotion Ambulation Ambulation activity did not occur: Safety/medical Investment banker, operational activity did not occur: Safety/medical concerns        Cognition Comprehension Comprehension assist level: Understands basic less than 25% of the time/ requires cueing >75% of the time  Expression Expression assist level: Expresses basis less than 25% of the time/requires cueing >75% of the time.  Social Interaction Social Interaction assist level: Interacts appropriately less than 25% of the time. May be  withdrawn or combative.  Problem Solving Problem solving assist level: Solves basic less than 25% of the time - needs direction nearly all the time or does not effectively solve problems and may need a restraint for safety  Memory Memory assist level: Recognizes or recalls less than 25% of the time/requires cueing greater than 75% of the time  Medical Problem List and Plan: 1. Decreased functional mobility with cognitive deficitssecondary to right IPH/SDH/temporal bone fracture after fall S/P right frontal temporoparietal craniectomy 01/14/2017 with placement of bone flap in the abdomen -continue CIR therapies  -family seems to involved 2. DVT Prophylaxis/Anticoagulation: Subcutaneous heparin. Monitor platelet counts and any signs of bleeding.  Check vascular study 3. Pain Management: Hycet  4. Mood: Ativan as needed 5. Neuropsych: This patient is notcapable of making decisions on hisown behalf.  -sleep chart  -seroquel for sleep/agitation  -limit benzos  -continue ritalin for arousal and initiation 6. Skin/Wound Care: Routine skin checks  -helmet for cranium protection  -flap site intact 7. Fluids/Electrolytes/Nutrition: NGT for nutrition 8.Seizure prophylaxis. Keppra 750 mg twice daily 9.Pneumonia. Five-day course of Ancef to completed 01/22/2017 10.Dysphagia. CorTrak nasogastric tube for a traditional support. Follow-up speech therapy for dysphagia 11.Urinary retention/Escherichia coli UTI. Hold Urecholine.Complete course of Cipro.Check PVR 3  -condom cath 12.Diabetes mellitus with peripheral neuropathy. Hemoglobin A1c 10.4. Lantus insulin 15 units twice a day. Patient on Glucophage 500 mg twice a day and Glucotrol 5 mg daily prior to admission. Resume as needed  -fair control at present  13.History of hypertension. Patient on lisinopril 2.5 mg daily prior to admission. Resume as needed 14.Constipation. Laxative assistance  LOS (Days) 2 A FACE TO FACE EVALUATION WAS PERFORMED  [name], MD 02/03/2017 9:07 AM

## 2017-02-03 NOTE — Progress Notes (Addendum)
ST and family indicating that patient with continued lethargy and difficulty participating in therapy. Will monitor for now--recheck UA/UCS.  Meds reviewed and note that he receive hycet at at 1:50 am and 6:25 am which could be contributing to lethargy. Will decrease hycet to 5 ml prn and schedule tylenol qid for pain.

## 2017-02-03 NOTE — Progress Notes (Signed)
   02/03/17 1318  Clinical Encounter Type  Visited With Patient  Visit Type Initial  Referral From Nurse  Consult/Referral To Chaplain  Recommendations follow up  Spiritual Encounters  Spiritual Needs Literature;Brochure  Stress Factors  Patient Stress Factors None identified  Advance Directives (For Healthcare)  Does Patient Have a Medical Advance Directive? No  Pt is not in room at the moment, will follow up with Unit Chaplain.  Chaplain [place] Lasecki A. Deval Mroczka , MA-PC , BA-REL/PHIL, [phone]

## 2017-02-03 NOTE — Progress Notes (Signed)
Speech Language Pathology Daily Session Note  Patient Details  Name: [name] MRN: [id number] Date of Birth: June 01, 1966  Today's Date: 02/03/2017 SLP Individual Time: 1000-1035 SLP Individual Time Calculation (min): 35 min  Short Term Goals: Week 1: SLP Short Term Goal 1 (Week 1): Patient will initiate a functional task with Max A multimodal cues. SLP Short Term Goal 2 (Week 1): Patient will follow 1 step commands in 25% of opportunities with Max A mutlimodal cues.  SLP Short Term Goal 3 (Week 1): Patient will verbally respond to questions in 25% of opportunities with Max A multimodal cues.   Skilled Therapeutic Interventions: Skilled treatment session focused on cognitive goals. Upon arrival, patient was asleep in the wheelchair with family present. Patient did not open his eyes or demonstrate alertness for more than 10 second intervals despite Max A multimodal cues in the forms of environmental modifications, verbal stimulation, use of oral care via the suction toothbrush and tactile cues. PA and RN aware. Patient's sister educated in regards to patient's current cognitive deficits and goals of skilled SLP intervention, she verbalized understanding. Patient missed remaining 10 minutes of session due to decreased arousal and ability to participate. Patient left upright in wheelchair with sister present. Continue with current plan of care.      Function:  Cognition Comprehension Comprehension assist level: Understands basic less than 25% of the time/ requires cueing >75% of the time  Expression   Expression assist level: Expresses basis less than 25% of the time/requires cueing >75% of the time.  Social Interaction Social Interaction assist level: Interacts appropriately less than 25% of the time. May be withdrawn or combative.  Problem Solving Problem solving assist level: Solves basic less than 25% of the time - needs direction nearly all the time or does not effectively solve  problems and may need a restraint for safety  Memory Memory assist level: Recognizes or recalls less than 25% of the time/requires cueing greater than 75% of the time    Pain No indications of pain   Therapy/Group: Individual Therapy  Lanisa Ishler 02/03/2017, 2:21 PM

## 2017-02-04 ENCOUNTER — Inpatient Hospital Stay (HOSPITAL_COMMUNITY): Payer: Self-pay | Admitting: Physical Therapy

## 2017-02-04 ENCOUNTER — Inpatient Hospital Stay (HOSPITAL_COMMUNITY): Payer: Self-pay | Admitting: Speech Pathology

## 2017-02-04 ENCOUNTER — Inpatient Hospital Stay (HOSPITAL_COMMUNITY): Payer: Self-pay | Admitting: Occupational Therapy

## 2017-02-04 DIAGNOSIS — S06303S Unspecified focal traumatic brain injury with loss of consciousness of 1 hour to 5 hours 59 minutes, sequela: Secondary | ICD-10-CM

## 2017-02-04 LAB — CBC WITH DIFFERENTIAL/PLATELET
BASOS ABS: 0 10*3/uL (ref 0.0–0.1)
BASOS PCT: 0 %
Eosinophils Absolute: 0.1 10*3/uL (ref 0.0–0.7)
Eosinophils Relative: 1 %
HEMATOCRIT: 29.1 % — AB (ref 39.0–52.0)
HEMOGLOBIN: 9.3 g/dL — AB (ref 13.0–17.0)
LYMPHS PCT: 13 %
Lymphs Abs: 0.8 10*3/uL (ref 0.7–4.0)
MCH: 27.9 pg (ref 26.0–34.0)
MCHC: 32 g/dL (ref 30.0–36.0)
MCV: 87.4 fL (ref 78.0–100.0)
MONO ABS: 0.2 10*3/uL (ref 0.1–1.0)
Monocytes Relative: 3 %
NEUTROS ABS: 4.6 10*3/uL (ref 1.7–7.7)
NEUTROS PCT: 83 %
Platelets: 317 10*3/uL (ref 150–400)
RBC: 3.33 MIL/uL — ABNORMAL LOW (ref 4.22–5.81)
RDW: 13.4 % (ref 11.5–15.5)
WBC: 5.6 10*3/uL (ref 4.0–10.5)

## 2017-02-04 LAB — BASIC METABOLIC PANEL
ANION GAP: 7 (ref 5–15)
BUN: 15 mg/dL (ref 6–20)
CALCIUM: 8.1 mg/dL — AB (ref 8.9–10.3)
CO2: 27 mmol/L (ref 22–32)
Chloride: 107 mmol/L (ref 101–111)
Creatinine, Ser: 0.47 mg/dL — ABNORMAL LOW (ref 0.61–1.24)
GLUCOSE: 117 mg/dL — AB (ref 65–99)
Potassium: 3.4 mmol/L — ABNORMAL LOW (ref 3.5–5.1)
Sodium: 141 mmol/L (ref 135–145)

## 2017-02-04 LAB — GLUCOSE, CAPILLARY
GLUCOSE-CAPILLARY: 86 mg/dL (ref 65–99)
GLUCOSE-CAPILLARY: 101 mg/dL — AB (ref 65–99)
GLUCOSE-CAPILLARY: 117 mg/dL — AB (ref 65–99)
Glucose-Capillary: 118 mg/dL — ABNORMAL HIGH (ref 65–99)
Glucose-Capillary: 131 mg/dL — ABNORMAL HIGH (ref 65–99)
Glucose-Capillary: 162 mg/dL — ABNORMAL HIGH (ref 65–99)

## 2017-02-04 LAB — URINALYSIS, ROUTINE W REFLEX MICROSCOPIC
Bilirubin Urine: NEGATIVE
GLUCOSE, UA: NEGATIVE mg/dL
Hgb urine dipstick: NEGATIVE
Ketones, ur: NEGATIVE mg/dL
LEUKOCYTES UA: NEGATIVE
NITRITE: NEGATIVE
PH: 6 (ref 5.0–8.0)
PROTEIN: NEGATIVE mg/dL
Specific Gravity, Urine: 1.017 (ref 1.005–1.030)

## 2017-02-04 NOTE — Progress Notes (Signed)
Speech Language Pathology Daily Session Note  Patient Details  Name: [name] MRN: [id number] Date of Birth: August 12, 1966  Today's Date: 02/04/2017 SLP Individual Time: 1400-1430 SLP Individual Time Calculation (min): 30 min  Short Term Goals: Week 1: SLP Short Term Goal 1 (Week 1): Patient will initiate a functional task with Max A multimodal cues. SLP Short Term Goal 2 (Week 1): Patient will follow 1 step commands in 25% of opportunities with Max A mutlimodal cues.  SLP Short Term Goal 3 (Week 1): Patient will verbally respond to questions in 25% of opportunities with Max A multimodal cues.  SLP Short Term Goal 4 (Week 1): Patient will consume trials of thin liquids with minimal overt s/s of aspiration in 25% of trials with Max A multimodal cues to assess readiness for MBS.  SLP Short Term Goal 5 (Week 1): Patient will consume trials of Dys. 1 textures with minimal overt s/s of aspiration in 25% of trials with Max A multimodal cues.   Skilled Therapeutic Interventions: Skilled treatment session focused on cognitive goals. Upon arrival, patient was awake while upright in the wheelchair and appeared more alert compared to the morning session.  Patient independently wrote his name, verbalized he was at the hospital and named the month when given choices from a field of 2. Patient also identified the year and reason for being in the hospital by circling them from a field of 3. Patient with intermittent attmepts to pull at NG tube when his right hand was not in use with task. Patient's sister present throughout session. Patient transferred back to bed at end of session with total +2 assist for safety. Patient left supine in bed with alarm on, bilateral hand and wrist restraints in place and all needs within reach. Continue with current plan of care.   Function:   Cognition Comprehension Comprehension assist level: Understands basic less than 25% of the time/ requires cueing >75% of the  time  Expression   Expression assist level: Expresses basis less than 25% of the time/requires cueing >75% of the time.  Social Interaction Social Interaction assist level: Interacts appropriately less than 25% of the time. May be withdrawn or combative.  Problem Solving Problem solving assist level: Solves basic problems with no assist;Solves basic less than 25% of the time - needs direction nearly all the time or does not effectively solve problems and may need a restraint for safety  Memory Memory assist level: Recognizes or recalls less than 25% of the time/requires cueing greater than 75% of the time    Pain No/Denies Pain   Therapy/Group: Individual Therapy  Nadiyah Zeis 02/04/2017, 4:27 PM

## 2017-02-04 NOTE — Progress Notes (Signed)
Late entry: Pt awake and alert, mittens in place, pt grabbing and pulling on NG tube.  Attempted to redirect pt with little success.  Tele sitter called this nurse every time staff leaving room.  Called [facility] PAC for new for wrist restraints.  Sister arrived and was able to sit with pt.  Restraints explained and discussed with her prior to placement. Edson Snowball (sister) verbalized understanding of restraints.

## 2017-02-04 NOTE — IPOC Note (Signed)
Overall Plan of Care [facility]) Patient Details Name: [name] MRN: [id number] DOB: 11-17-1965  Admitting Diagnosis: trauma fall  R IPH  SDH  Crani  Hospital Problems: Principal Problem:   Traumatic intraparenchymal hemorrhage (HCC) Active Problems:   Aphasia   Dysphagia, oropharyngeal     Functional Problem List: Nursing Behavior, Bladder, Bowel, Endurance, Medication Management, Motor, Nutrition, Pain, Perception, Safety, Sensory, Skin Integrity  PT Balance, Behavior, Endurance, Motor, Pain, Safety, Perception, Sensory  OT Balance, Behavior, Cognition, Endurance, Motor, Pain, Perception, Safety, Vision  SLP Cognition  TR         Basic ADL's: OT Grooming, Bathing, Dressing, Toileting     Advanced  ADL's: OT       Transfers: PT Bed Mobility, Bed to Chair, Customer service manager, Tub/Shower     Locomotion: PT Ambulation, Psychologist, prison and probation services, Stairs     Additional Impairments: OT Fuctional Use of Upper Extremity  SLP Communication, Social Cognition expression Problem Solving, Memory, Awareness, Social Interaction, Attention  TR      Anticipated Outcomes Item Anticipated Outcome  Self Feeding No goal  Swallowing  Min A   Basic self-care  Min assist  Toileting  Min-mod assist   Bathroom Transfers Min-mod assist  Bowel/Bladder  Minimal assist  Transfers  min assist  Locomotion  min assist  Communication  Min A  Cognition  Min A   Pain  3 or less  Safety/Judgment  mod assist   Therapy Plan: PT Intensity: Minimum of 1-2 x/day ,45 to 90 minutes PT Frequency: 5 out of 7 days PT Duration Estimated Length of Stay: 4 weeks OT Intensity: Minimum of 1-2 x/day, 45 to 90 minutes OT Frequency: 5 out of 7 days OT Duration/Estimated Length of Stay: 4 weeks SLP Intensity: Minumum of 1-2 x/day, 30 to 90 minutes SLP Frequency: 3 to 5 out of 7 days SLP Duration/Estimated Length of Stay: 4 weeks        Team Interventions: Nursing Interventions  Patient/Family Education, Bladder Management, Bowel Management, Disease Management/Prevention, Cognitive Remediation/Compensation, Skin Care/Wound Management, Medication Management, Pain Management, Dysphagia/Aspiration Precaution Training, Discharge Planning, Psychosocial Support  PT interventions Ambulation/gait training, DME/adaptive equipment instruction, Neuromuscular re-education, Psychosocial support, Stair training, UE/LE Strength taining/ROM, Wheelchair propulsion/positioning, Therapeutic Activities, UE/LE Coordination activities, Pain management, Skin care/wound management, Discharge planning, Balance/vestibular training, Cognitive remediation/compensation, Disease management/prevention, Functional mobility training, Patient/family education, Splinting/orthotics, Therapeutic Exercise, Visual/perceptual remediation/compensation  OT Interventions Balance/vestibular training, Cognitive remediation/compensation, Discharge planning, Disease mangement/prevention, DME/adaptive equipment instruction, Functional mobility training, Neuromuscular re-education, Pain management, Patient/family education, Psychosocial support, Self Care/advanced ADL retraining, Skin care/wound managment, Therapeutic Activities, Therapeutic Exercise, UE/LE Strength taining/ROM, UE/LE Coordination activities, Visual/perceptual remediation/compensation, Wheelchair propulsion/positioning  SLP Interventions Cognitive remediation/compensation, Cueing hierarchy, Functional tasks, Patient/family education, Therapeutic Activities, Internal/external aids, Speech/Language facilitation, Environmental controls  TR Interventions    SW/CM Interventions Discharge Planning, Psychosocial Support, Patient/Family Education    Team Discharge Planning: Destination: PT-Home ,OT- Home , SLP-Home Projected Follow-up: PT-24 hour supervision/assistance, Home health PT, OT-  Home health OT, 24 hour supervision/assistance, SLP-24 hour  supervision/assistance, Home Health SLP Projected Equipment Needs: PT-To be determined, OT- To be determined, 3 in 1 bedside comode, Tub/shower bench, SLP-To be determined Equipment Details: PT- , OT-  Patient/family involved in discharge planning: PT- Patient unable/family or caregiver not available,  OT-Patient unable/family or caregiver not available, SLP-Patient unable/family or caregive not available  MD ELOS: 25-30d  Medical Rehab Prognosis:  Good Assessment:  51 y.o.right handedlimited English-speaking malewith history of hypertension,diabetes mellitus. Per chart review patient  lives with sister was independent prior to admission.Plan is for sister and brother-in-law to provide assistance as needed at time of discharge.Presented 01/10/2017 fter being found down from a supposed fall. CT of the head and imaging revealed intracranial hemorrhage with a 4.5 cm right frontal intraparenchymal hemorrhage, 6 mm right subdural hematoma and other scattered areas of intraparenchymal and subarachnoid hemorrhage with right to left midline shift. Nondisplaced vertical fracture through the left temporal bone.CT cervical spine negative.CT abdomen and pelvis with no evidence of acute injury.UDS negative.neurosurgery Dr. Marikay Alar advised conservative care with ICP monitor.follow-up ENT Dr. Jenne Pane for temporal bone fracture no surgical intervention required.follow-up CT imaging showed increased right to left shift greater than 1 cm. Basal cisterns slightly effaced.Patient Withsteady rise in his ICP and not responsive to heavy sedation. .Underwent right frontal temporal parietal craniectomy for hematoma evacuation 01/14/2017 per Dr. Mikal Plane with placement of bone flap in the abdomen. Keppra for seizure prophylaxis.Patient was extubated 01/19/2017.Nasogastric tube in place for nutritional support patient remains NPO.Later placed on subcutaneous heparin for DVT prophylaxis. Placed on Ancef 01/18/2017 for suspected  pneumonia 5 days.Bouts of urinary retention with Urecholine addedas well as greater than 100,000 Escherichia coli UTI placed on Cipro 01/28/2017   Now requiring 24/7 Rehab RN,MD, as well as CIR level PT, OT and SLP.  Treatment team will focus on ADLs and mobility with goals set at min A  See Team Conference Notes for weekly updates to the plan of care

## 2017-02-04 NOTE — Progress Notes (Signed)
Occupational Therapy Session Note  Patient Details  Name: [name] MRN: [id number] Date of Birth: [birth year]  Today's Date: 02/04/2017 OT Individual Time: 0800-0900 OT Individual Time Calculation (min): 60 min    Short Term Goals: Week 1:  OT Short Term Goal 1 (Week 1): Pt will sustain attention to self-care task for 2 mins OT Short Term Goal 2 (Week 1): Pt will initiate self-care task with mod cues OT Short Term Goal 3 (Week 1): Pt will complete bathing with max assist OT Short Term Goal 4 (Week 1): Pt will maintain sitting balance with min assist for 5 mins to engage in self-care task OT Short Term Goal 5 (Week 1): Pt will complete toilet transfer with max assist of 1 caregiver  Skilled Therapeutic Interventions/Progress Updates:    Upon entering the room, pt supine in bed with interpreter present this session. Pt with no signs or symptoms of pain this session. Pt was incontinent of urine and NG tube leaking on bed upon entering the room. Max A to roll pt L <> R for hygiene. Pt sitting on EOB with max A for sitting balance for 7 minutes to wash back and don clean gown. Pt transferred from bed > wheelchair with squat pivot total A of 2 this session for safety. UE mitten's removed in order to encourage use for self care tasks. Pt began attempting to pull at NG tube and helmet and mitten's having to be reapplied this session. Increased time of 1 minute needed for pt to answer orientation questions when asked. Pt needing max A and given 2 options to orient to location, time, and situation. Pt taken in room with moderate external distractions and pt able to name 5/7 ADL items correctly with increased time. Quick release belt donned and pt returned to RN station for safety. Interpreter sitting at RN station with pt. All needed items within reach.   Therapy Documentation Precautions:  Precautions Precautions: Fall Precaution Comments: NPO (NG tube), BUE mittens, bone flap removed R  side of head Restrictions Weight Bearing Restrictions: No General:   Vital Signs: Therapy Vitals Temp: 98.5 F (36.9 C) Temp Source: Axillary Pulse Rate: (!) 108 Resp: (!) 21 BP: 124/86 Patient Position (if appropriate): Sitting Oxygen Therapy SpO2: 98 % O2 Device: Not Delivered  See Function Navigator for Current Functional Status.   Therapy/Group: Individual Therapy  [name] 02/04/2017, 3:28 PM

## 2017-02-04 NOTE — Progress Notes (Signed)
[place] PHYSICAL MEDICINE & REHABILITATION     PROGRESS NOTE    Subjective/Complaints: More alert this morning. Indicates he's having some chest pain (inconsistent)]  ROS: Limited due cognitive/behavioral    Objective: Vital Signs: Blood pressure 96/69, pulse 97, temperature 98.6 F (37 C), temperature source Axillary, resp. rate 20, height  (1.575 m), weight 60.5 kg (133 lb 6.1 oz), SpO2 97 %. Ct Head Wo Contrast  Result Date: 02/03/2017 CLINICAL DATA:  Traumatic brain injury after a fall. Lethargy since yesterday. EXAM: CT HEAD WITHOUT CONTRAST TECHNIQUE: Contiguous axial images were obtained from the base of the skull through the vertex without intravenous contrast. COMPARISON:  01/26/2017 FINDINGS: Brain: Previous right frontal craniectomy. Persistent edema/ low-density in the right lateral temporal lobe, insular region and right frontal lobe consistent with previous hemorrhagic contusions and edema throughout that region. No longer any hyperdense blood. Because of the craniectomy, there is resolution of midline shift. Tiny amount of blood dependent in the left occipital horn lateral ventricle again demonstrated. No evidence of developing hydrocephalus. No extra-axial fluid collection. Low-density in the cingulate gyrus on the left is consistent with infarction in that region, probably secondary to vascular compromise related to the previous mass effect and transtentorial herniation. Vascular: No acute vascular finding. Skull: No acute calvarial finding. Sinuses/Orbits: Clear/normal Other: None significant IMPRESSION: Previous right frontal craniectomy for resolution of pressure. There is no longer any midline shift. There is no longer any hyperdense blood. Persistent edema in the right temporal and frontal region related to previous hemorrhagic contusion. Tiny amount of hyperdense blood remaining visible on the occipital horn of left lateral ventricle. No hydrocephalus. Infarction in  the cingulate gyrus on the left, probably secondary to the previous trans tentorial herniation. Electronically Signed   By: Paulina Fusi M.D.   On: 02/03/2017 15:18    Recent Labs  02/02/17 0447 02/04/17 0428  WBC 5.8 5.6  HGB 9.9* 9.3*  HCT 29.6* 29.1*  PLT 402* 317    Recent Labs  02/02/17 0447 02/04/17 0428  NA 138 141  K 3.8 3.4*  CL 106 107  GLUCOSE 169* 117*  BUN 15 15  CREATININE 0.45* 0.47*  CALCIUM 8.4* 8.1*   CBG (last 3)   Recent Labs  02/03/17 1947 02/04/17 0021 02/04/17 0409  GLUCAP 116* 131* 86    Wt Readings from Last 3 Encounters:  02/04/17 60.5 kg (133 lb 6.1 oz)  02/01/17 65.6 kg (144 lb 9.6 oz)  07/31/16 68 kg (150 lb)    Physical Exam:    Gen: pt with eyes open, slow to respond HENT:  Craniotomy site clean and dry. Nasogastric tube in place Eyes:  Pupils reactive to light Neck: Normal range of motion. Neck supple. No thyromegalypresent.  Cardiovascular: RRR  Respiratory:  Normal effort  GI: Soft. Bowel sounds are normal. He exhibits no distension. Flap site intact Neurological: He is alert.  Patient is awake, remains distracted.  Made eye contact with me.  Moves all 4's. Follows simple commands with tactile cues less than 50% Psychiatric:    distractedand flat Skin. Warm and dry  Assessment/Plan: 1. Cognitive and functional deficits secondary to right traumatic ICH/SDH s/p craniectomy which require 3+ hours per day of interdisciplinary therapy in a comprehensive inpatient rehab setting. Physiatrist is providing close team supervision and 24 hour management of active medical problems listed below. Physiatrist and rehab team continue to assess barriers to discharge/monitor patient progress toward functional and medical goals.  Function:  Bathing Bathing position  Bathing parts      Bathing assist        Upper Body Dressing/Undressing Upper body dressing                    Upper body assist        Lower  Body Dressing/Undressing Lower body dressing                                  Lower body assist        Toileting Toileting Toileting activity did not occur: No continent bowel/bladder event   Toileting steps completed by helper: Adjust clothing prior to toileting, Performs perineal hygiene, Adjust clothing after toileting    Toileting assist Assist level:  (total)   Transfers Chair/bed transfer   Chair/bed transfer method: Squat pivot Chair/bed transfer assist level: 2 helpers Chair/bed transfer assistive device: Armrests, Bedrails     Locomotion Ambulation Ambulation activity did not occur: Safety/medical Investment banker, operational activity did not occur: Safety/medical concerns        Cognition Comprehension Comprehension assist level: Understands basic less than 25% of the time/ requires cueing >75% of the time  Expression Expression assist level: Expresses basis less than 25% of the time/requires cueing >75% of the time.  Social Interaction Social Interaction assist level: Interacts appropriately less than 25% of the time. May be withdrawn or combative.  Problem Solving Problem solving assist level: Solves basic problems with no assist, Solves basic less than 25% of the time - needs direction nearly all the time or does not effectively solve problems and may need a restraint for safety  Memory Memory assist level: Recognizes or recalls less than 25% of the time/requires cueing greater than 75% of the time  Medical Problem List and Plan: 1. Decreased functional mobility with cognitive deficitssecondary to right IPH/SDH/temporal bone fracture after fall S/P right frontal temporoparietal craniectomy 01/14/2017 with placement of bone flap in the abdomen -continue CIR therapies---more alert today  -PT,OT, SLP 2. DVT Prophylaxis/Anticoagulation: Subcutaneous heparin. Monitor platelet counts and any signs of bleeding. Check vascular  study 3. Pain Management: Hycet  4. Mood: Ativan as needed 5. Neuropsych: This patient is notcapable of making decisions on hisown behalf.  -sleep chart  -seroquel for sleep/agitation  -limit benzos  -continue ritalin for arousal and initiation 6. Skin/Wound Care: Routine skin checks  -helmet for cranium protection  -flap site remains intact 7. Fluids/Electrolytes/Nutrition: NGT for nutrition  -I personally reviewed all of the patient's labs today, and lab work is within normal limits. 8.Seizure prophylaxis. Keppra 750 mg twice daily 9.Pneumonia. Five-day course of Ancef  completed 01/22/2017 10.Dysphagia. CorTrak nasogastric tube for a traditional support.   -advance per SLP 11.Urinary retention/Escherichia coli UTI. Hold Urecholine.Complete course of Cipro.Check PVR 3  -condom cath 12.Diabetes mellitus with peripheral neuropathy. Hemoglobin A1c 10.4. Lantus insulin 15 units twice a day. Patient on Glucophage 500 mg twice a day and Glucotrol 5 mg daily prior to admission. Resume as needed  -reasonable control at present  13.History of hypertension. Patient on lisinopril 2.5 mg daily prior to admission. Resume as needed 14.Constipation. Laxative assistance  LOS (Days) 3 A FACE TO FACE EVALUATION WAS PERFORMED  [name], MD 02/04/2017 8:44 AM

## 2017-02-04 NOTE — Progress Notes (Signed)
Physical Therapy Session Note  Patient Details  Name: [name] MRN: [id number] Date of Birth: 28-Feb-1966  Today's Date: 02/04/2017 PT Individual Time: 1000-1058 AND 1300-1400 PT Individual Time Calculation (min): 58 min AND 60 min   Short Term Goals: Week 1:  PT Short Term Goal 1 (Week 1): Pt will initiate rolling L/R and supine<>sitting with max assist. PT Short Term Goal 2 (Week 1): Pt will transfer bed<>w/c with max assist +1. PT Short Term Goal 3 (Week 1): Pt will demonstrate sustained attention in controlled environment for 2 minutes with supervision.   Skilled Therapeutic Interventions/Progress Updates:     Pt received sitting in WC and agreeable to PT. Pt transported to ortho gym with total assist in TIS WC.  Interpreter present throughout treatment.  Reaching initiation task with LUE. Pt able to properly identify all colors of been bags, but only initiated reach with L hand 1 of 6 trials with significant time to initiate task.   Transfer training instructed by PT for squat pivot/lateral scoot for total assist +2 due to poor initiation and activation of BLE. Max cues from PT for anterior weight shift and UE placement prior to transfer. Py unable to maintain UE positioning and demonstrated no activation in BLE throughout training.   Sitting balance sitting EOM x 10 minute with max-total assist from PT throughout. Pt able to initiate righting reactions intermittently throughout in attempt to prevent posterior LOB, but unable to achieve or maintain neutral sitting mulitple anteiror LOB and L lateral LOB without any protective reactions.   Patient returned too room and left sitting in [name] with call bell in reach and all needs met.    Session 2  Interpreter present for entire treatment.  Pt received supine in bed and agreeable to PT. Supine>sit transfer with total assist and max cues for use of BLE to push to sitting position. Pt noted to have had incontinent bladder  evacuation once sitting EOB. Returned to supine with total assist +2. Rolling L and R for personal hygiene with max-total assist. Pt able to initiate use of R arm to reach for rail to assist with roll on this day.   Attempted AAROM BLE for hip flexion/extension. Trace intentional movement in gravity eliminated position for hip flexion/extension x8 BLE  Supine>sit as listed above. Sitting balance with max assist EOB with BUE support on edge of bed. Pt able to maintain neutral position up to 25seconds without additional assist from PT.   Lateral scoot transfer to [name] with total assist +2. Pt self selected to support BUE on PT for transfer.   Object identification completed for 10 items with 10/10 success including whistle, hair brush, flashlight pill bottle and others. Reaching to one object and demonstration of proper use of object completed for 8/10 items. Reaching to 2 fields with 50% success and only mild improvement with cues from PT.   Patient returned too room and left sitting in [name] with call bell in reach and all needs met.       Therapy Documentation Precautions:  Precautions Precautions: Fall Precaution Comments: NPO (NG tube), BUE mittens, bone flap removed R side of head Restrictions Weight Bearing Restrictions: No Pain. 0/10   See Function Navigator for Current Functional Status.   Therapy/Group: Individual Therapy  [name] 02/04/2017, 11:04 AM

## 2017-02-04 NOTE — Evaluation (Addendum)
Speech Language Pathology Bedside Swallow Evaluation   Patient Details  Name: [name] MRN: [id number] Date of Birth: 04-Nov-1965  SLP Diagnosis: Dysphagia  Rehab Potential: Fair ELOS: 4 weeks     Today's Date: 02/04/2017 SLP Individual Time: 1100-1155 SLP Individual Time Calculation (min): 55 min   Problem List:  Patient Active Problem List   Diagnosis Date Noted  . Traumatic intraparenchymal hemorrhage ([place]) 02/01/2017  . Aphasia 02/01/2017  . Dysphagia, oropharyngeal 02/01/2017  . Intracerebral hemorrhage ([place]) 01/14/2017  . Fall 01/10/2017  . Subdural hemorrhage ([place]) 01/10/2017  . Diabetes type 2, uncontrolled ([place]) 06/13/2015   Past Medical History:  Past Medical History:  Diagnosis Date  . Diabetes mellitus without complication [facility])    Past Surgical History:  Past Surgical History:  Procedure Laterality Date  . CRANIOTOMY Right 01/13/2017   Procedure: 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, Placement of bone flap in abdomen;  Surgeon: Ashok Pall, MD;  Location: [place];  Service: Neurosurgery;  Laterality: Right;    Assessment / Plan / Recommendation Clinical Impression Patient was administered trials of ice chips and demonstrated minimal oral manipulation and prolonged AP transit due to decreased awareness of bolus secondary to lethargy and restlessness. Patient demonstrated what appeared to be a delayed swallow initiation with delayed and subtle throat clear X 1. Anterior spillage of trials and/or saliva was also noted. Recommend patient remain NPO at this time.    Skilled Therapeutic Interventions          Administered a BSE. Please see above for details. Patient required Max A verbal cues for sustained attention to task for ~30-60 seconds due lethargy and restlessness. Patient essentially nonverbal throughout session and required Max A verbal cues for his head to remain in a neutral position. Patient transferred back to bed  with total +2 for safety. Patient left supine in bed with all needs within reach, bilateral hand and wrist restraints in place and alarm on. Continue with current plan of care.    SLP Assessment  Patient will need skilled Speech Lanaguage Pathology Services during CIR admission    Recommendations  SLP Diet Recommendations: Alternative means - temporary;NPO Medication Administration: Via alternative means Oral Care Recommendations: Oral care QID Patient destination: Home Follow up Recommendations: 24 hour supervision/assistance;Home Health SLP Equipment Recommended: To be determined    SLP Frequency 3 to 5 out of 7 days   SLP Duration  SLP Intensity  SLP Treatment/Interventions 4 weeks   Minumum of 1-2 x/day, 30 to 90 minutes  Cueing hierarchy;Dysphagia/aspiration precaution training;Internal/external aids;Environmental controls;Therapeutic Activities;Patient/family education;Functional tasks    Pain No/Denies Pain    Function:  Eating Eating   Modified Consistency Diet: Yes Eating Assist Level: Helper feeds patient (with trials from SLP )           Cognition Comprehension Comprehension assist level: Understands basic less than 25% of the time/ requires cueing >75% of the time  Expression   Expression assist level: Expresses basis less than 25% of the time/requires cueing >75% of the time.  Social Interaction Social Interaction assist level: Interacts appropriately less than 25% of the time. May be withdrawn or combative.  Problem Solving Problem solving assist level: Solves basic problems with no assist;Solves basic less than 25% of the time - needs direction nearly all the time or does not effectively solve problems and may need a restraint for safety  Memory Memory assist level: Recognizes or recalls less than 25% of the time/requires cueing greater than 75%  of the time   Short Term Goals: Week 1: SLP Short Term Goal 1 (Week 1): Patient will initiate a functional  task with Max A multimodal cues. SLP Short Term Goal 2 (Week 1): Patient will follow 1 step commands in 25% of opportunities with Max A mutlimodal cues.  SLP Short Term Goal 3 (Week 1): Patient will verbally respond to questions in 25% of opportunities with Max A multimodal cues.  SLP Short Term Goal 4 (Week 1): Patient will consume trials of thin liquids with minimal overt s/s of aspiration in 25% of trials with Max A multimodal cues to assess readiness for MBS.  SLP Short Term Goal 5 (Week 1): Patient will consume trials of Dys. 1 textures with minimal overt s/s of aspiration in 25% of trials with Max A multimodal cues.   Refer to Care Plan for Long Term Goals  Recommendations for other services: None   Discharge Criteria: Patient will be discharged from SLP if patient refuses treatment 3 consecutive times without medical reason, if treatment goals not met, if there is a change in medical status, if patient makes no progress towards goals or if patient is discharged from hospital.  The above assessment, treatment plan, treatment alternatives and goals were discussed and mutually agreed upon: by patient and by family  [name] 02/04/2017, 4:15 PM

## 2017-02-04 NOTE — Care Management Note (Signed)
Inpatient Rehabilitation Center Individual Statement of Services  Patient Name:  [name]  Date:  02/04/2017  Welcome to the Inpatient Rehabilitation Center.  Our goal is to provide you with an individualized program based on your diagnosis and situation, designed to meet your specific needs.  With this comprehensive rehabilitation program, you will be expected to participate in at least 3 hours of rehabilitation therapies Monday-Friday, with modified therapy programming on the weekends.  Your rehabilitation program will include the following services:  Physical Therapy (PT), Occupational Therapy (OT), Speech Therapy (ST), 24 hour per day rehabilitation nursing, Therapeutic Recreaction (TR), Neuropsychology, Case Management (Social Worker), Rehabilitation Medicine, Nutrition Services and Pharmacy Services  Weekly team conferences will be held on Tuesdays to discuss your progress.  Your Social Worker will talk with you frequently to get your input and to update you on team discussions.  Team conferences with you and your family in attendance may also be held.  Expected length of stay: 4 weeks  Overall anticipated outcome: minimal assist  Depending on your progress and recovery, your program may change. Your Social Worker will coordinate services and will keep you informed of any changes. Your Social Worker's name and contact numbers are listed  below.  The following services may also be recommended but are not provided by the Inpatient Rehabilitation Center:   Driving Evaluations  Home Health Rehabiltiation Services  Outpatient Rehabilitation Services  Vocational Rehabilitation   Arrangements will be made to provide these services after discharge if needed.  Arrangements include referral to agencies that provide these services.  Your insurance has been verified to be:  None  Your primary doctor is:  None  Pertinent information will be shared with your doctor and your  insurance company.  Social Worker:  [place], [place] [phone] or ([phone]   Information discussed with and copy given to patient by: Amada Jupiter, 02/04/2017, 4:25 PM

## 2017-02-04 NOTE — Progress Notes (Signed)
Social Work  Social Work Assessment and Plan  Patient Details  Name: [name] MRN: [id number] Date of Birth: [birth year]  Today's Date: 02/04/2017  Problem List:  Patient Active Problem List   Diagnosis Date Noted  . Traumatic intraparenchymal hemorrhage ([facility]) 02/01/2017  . Aphasia 02/01/2017  . Dysphagia, oropharyngeal 02/01/2017  . Intracerebral hemorrhage ([facility]) 01/14/2017  . Fall 01/10/2017  . Subdural hemorrhage ([facility]) 01/10/2017  . Diabetes type 2, uncontrolled ([facility]) 06/13/2015   Past Medical History:  Past Medical History:  Diagnosis Date  . Diabetes mellitus without complication [facility])    Past Surgical History:  Past Surgical History:  Procedure Laterality Date  . CRANIOTOMY Right 01/13/2017   Procedure: 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, Placement of bone flap in abdomen;  Surgeon: Coletta Memos, MD;  Location: [facility] OR;  Service: Neurosurgery;  Laterality: Right;   Social History:  reports that he has never smoked. He has never used smokeless tobacco. He reports that he drinks alcohol. He reports that he does not use drugs.  Family / Support Systems Marital Status: Single Patient Roles: Other (Comment), Parent (has a sister and brother-in-law) Children: Pt does have an adult daughter who lives in [place].   Other Supports: Mother and other extended family in [place].  Primary support:  sister, [name] @ ([phone] and her spouse. Anticipated Caregiver: sister and family Ability/Limitations of Caregiver: Sister plans to be caregiver, her husband will assist as able. Caregiver Availability: 24/7 Family Dynamics: Sister and brother-in-law are extremely supportive and encouraging.  Sister tearful as she spoke of their mutual support of one another and states, "This has just turned our lives upside down."  Social History Preferred language: English Religion:  Cultural Background: Pt originally from [place].  [place]oved to the  [place] ~ 20 y[place] ago but continues to return to [place] to see family every few years. 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 Guardian/Conservator: None - per MD, pt is not capable of making decisions on his own behalf - defer to sister.   Abuse/Neglect Physical Abuse: Denies Verbal Abuse: Denies Sexual Abuse: Denies Exploitation of patient/patient's resources: Denies Self-Neglect: Denies  Emotional Status Pt's affect, behavior adn adjustment status: Pt with significant cognitive deficits and is not verbal with me despite encouragement.  At one point, he and sister were on "face time" with their mother in [place] but still could not get pt to speak with me about that.  Does not appear to be in any emotional distress but will monitor and refer for neuropsychology as indicated. Recent Psychosocial Issues: None Pyschiatric History: None Substance Abuse History: none  Patient / Family Perceptions, Expectations & Goals Pt/Family understanding of illness & functional limitations: Cannot assess how much pt understands about his situation.  Sister with good, basic understanding of his TBI and current functional limitations/ need for CIR.  Education will need to be ongoing with family as they prepare for d/c. Premorbid pt/family roles/activities: Pt was working f/t, living alone and completely independent. Anticipated changes in roles/activities/participation: Anticipate need for 24/7 assistance.  Sister prepared to assume primary caregiver role. Pt/family expectations/goals: Sister very tearful as she reports she "just hope he can get better from this."  [employer]: None Premorbid Home Care/DME Agencies: None Transportation available at discharge:  yes Resource referrals recommended: Neuropsychology, Support group (specify)  Discharge Planning Living Arrangements: Alone Support Systems: Other relatives Type of Residence: Private residence Insurance Resources: Customer service manager Resources: Employment Financial Screen Referred: Yes Living Expenses: Rent Money Management: Patient Does the patient have any problems obtaining your medications?: Yes (Describe) (no insurance) Home Management: Pt was independent Patient/Family Preliminary Plans: Pt to d/c home with sister and her family. Social Work Anticipated Follow Up Needs: HH/OP, Support Group Expected length of stay: 4 weeks  Clinical Impression Very unfortunate gentleman found down at his home and had suffered a severe TBI.  Continues with significant deficits and decreased arousal.  Sister very supportive and assists with completing assessment interview.  Cannot assess emotional status at this point but will monitor as he clears cognitively.  Family prepared to proide 24/7 assist at sister's home (she does not work).  Will follow for ongoing support, education and d/c planning needs.  Cristin Penaflor 02/04/2017, 4:13 PM

## 2017-02-05 ENCOUNTER — Inpatient Hospital Stay (HOSPITAL_COMMUNITY): Payer: Self-pay | Admitting: Speech Pathology

## 2017-02-05 ENCOUNTER — Inpatient Hospital Stay (HOSPITAL_COMMUNITY): Payer: Self-pay | Admitting: Physical Therapy

## 2017-02-05 ENCOUNTER — Inpatient Hospital Stay (HOSPITAL_COMMUNITY): Payer: Self-pay | Admitting: Occupational Therapy

## 2017-02-05 DIAGNOSIS — S069X0S Unspecified intracranial injury without loss of consciousness, sequela: Secondary | ICD-10-CM

## 2017-02-05 DIAGNOSIS — R4689 Other symptoms and signs involving appearance and behavior: Secondary | ICD-10-CM

## 2017-02-05 LAB — URINE CULTURE: Culture: NO GROWTH

## 2017-02-05 LAB — GLUCOSE, CAPILLARY
GLUCOSE-CAPILLARY: 182 mg/dL — AB (ref 65–99)
Glucose-Capillary: 111 mg/dL — ABNORMAL HIGH (ref 65–99)
Glucose-Capillary: 113 mg/dL — ABNORMAL HIGH (ref 65–99)
Glucose-Capillary: 122 mg/dL — ABNORMAL HIGH (ref 65–99)
Glucose-Capillary: 124 mg/dL — ABNORMAL HIGH (ref 65–99)
Glucose-Capillary: 133 mg/dL — ABNORMAL HIGH (ref 65–99)
Glucose-Capillary: 96 mg/dL (ref 65–99)

## 2017-02-05 MED ORDER — POTASSIUM CHLORIDE 20 MEQ/15ML (10%) PO SOLN
20.0000 meq | Freq: Every day | ORAL | Status: DC
  Administered 2017-02-05 – 2017-02-09 (×4): 20 meq
  Filled 2017-02-05 (×5): qty 15

## 2017-02-05 MED ORDER — QUETIAPINE FUMARATE 25 MG PO TABS
25.0000 mg | ORAL_TABLET | Freq: Two times a day (BID) | ORAL | Status: DC
  Administered 2017-02-05 – 2017-02-09 (×8): 25 mg
  Filled 2017-02-05 (×10): qty 1

## 2017-02-05 NOTE — Progress Notes (Signed)
Pt in w/c with sister at bedside. Sister reports that pt wishes to harm himself with plan to jump out of window in room.  Doesn't want to live anymore and will shot himself.  Called interpreter line to get face to face to confirm wish to harm with plan.  PAC/MD aware.

## 2017-02-05 NOTE — Progress Notes (Signed)
Physical Therapy Note  Patient Details  Name: [name] MRN: [id number] Date of Birth: Aug 02, 1966 Today's Date: 02/05/2017    Time: 1300-1400 60 minutes co-tx with OT  2:1 no signs/symptoms of pain. Pt drowsy upon arrival, requires increased cues to wake and participate. Attempted bridging to don pants, pt not initiating or assisting.  Supine to sit multiple attempts with total A due to pt fatigue and leaning Lt.  Seated edge of bed with mod/max A 2 x 5 minutes.  Attempt standing x 2 attempts to don pants with +2 total A, pt not assisting or initiating.  Pt needing to be changed to performed rolling multiple attempts with max multimodal cues for sequencing, max A.  Scooting edge of bed and squat pivot to chair with +2 assist. Pt leans forward and unable to control eccentric motion, requiring total A return to upright.  Seated in chair pt participated in boxing bag task with increased encouragement pt able to hit boxing back with Rt UE multiple times.  Pt limited by fatigue throughout session requiring increased assist for all tasks.   Jeramiah Mccaughey 02/05/2017, 2:00 PM

## 2017-02-05 NOTE — Progress Notes (Signed)
Physical Therapy Session Note  Patient Details  Name: [name] MRN: [id number] Date of Birth: [birth year]  Today's Date: 02/05/2017 PT Individual Time: 0800-0900 PT Individual Time Calculation (min): 60 min   Short Term Goals: Week 1:  PT Short Term Goal 1 (Week 1): Pt will initiate rolling L/R and supine<>sitting with max assist. PT Short Term Goal 2 (Week 1): Pt will transfer bed<>w/c with max assist +1. PT Short Term Goal 3 (Week 1): Pt will demonstrate sustained attention in controlled environment for 2 minutes with supervision.  Skilled Therapeutic Interventions/Progress Updates:    Pt in bed with sister and interpreter present. Bed Mobility: max assist for supine to sitting EOB. Transfers: +2 squat pivot transfer bed>w/c<>mat table. Attempting to cue pt for hand placement but not able to follow cues. Pt frequently reaching for variable surfaces and therapist. Balance activities attempted in sitting on mat table. Pt able to name colored objects and select correct color between options. Pt very distracted throughout session and repeated redirection needed. Pt able to maintain sitting balance with min assist for brief periods but quickly losing balance anteriorly. Attempting to encourage UE placement to assist but pt unable to sit still long enough to utilize UEs consistently. Pt reaching for NG and catheter repeatedly during session. Pt with noted tendency to push with Rt UE in sitting and improved balance when in relaxed position.  Due to increasing agitation and restlessness, pt transferred back to TIS w/c. Returned to room and safety belt applied as well as wrist restraints and mittens. Sister present and nursing aware.   Therapy Documentation Precautions:  Precautions Precautions: Fall Precaution Comments: NPO (NG tube), BUE mittens, bone flap removed R side of head Restrictions Weight Bearing Restrictions: No   Pain:  Denies pain, no observed indications during session.    See Function Navigator for Current Functional Status.   Therapy/Group: Individual Therapy  Delton See, PT 02/05/2017, 12:50 PM

## 2017-02-05 NOTE — Progress Notes (Signed)
During assessment noted that IV therapy was disconnected from pt.  Obtained new supplies to continue iv therapy.  Also noted tube feeding pump connected to pt but pump turned off.

## 2017-02-05 NOTE — Progress Notes (Addendum)
[facility] PHYSICAL MEDICINE & REHABILITATION     PROGRESS NOTE    Subjective/Complaints: Up and alert. Working with therapy. Restless. After I left the room, the patient later expressed that he "wanted to get a gun and shoot himself".   ROS: Unable to obtain due to cognitive/mental status issues.  Objective: Vital Signs: Blood pressure 104/69, pulse 88, temperature 98 F (36.7 C), temperature source Axillary, resp. rate 18, height  (1.575 m), weight 64.4 kg (141 lb 15.6 oz), SpO2 98 %. Ct Head Wo Contrast  Result Date: 02/03/2017 CLINICAL DATA:  Traumatic brain injury after a fall. Lethargy since yesterday. EXAM: CT HEAD WITHOUT CONTRAST TECHNIQUE: Contiguous axial images were obtained from the base of the skull through the vertex without intravenous contrast. COMPARISON:  01/26/2017 FINDINGS: Brain: Previous right frontal craniectomy. Persistent edema/ low-density in the right lateral temporal lobe, insular region and right frontal lobe consistent with previous hemorrhagic contusions and edema throughout that region. No longer any hyperdense blood. Because of the craniectomy, there is resolution of midline shift. Tiny amount of blood dependent in the left occipital horn lateral ventricle again demonstrated. No evidence of developing hydrocephalus. No extra-axial fluid collection. Low-density in the cingulate gyrus on the left is consistent with infarction in that region, probably secondary to vascular compromise related to the previous mass effect and transtentorial herniation. Vascular: No acute vascular finding. Skull: No acute calvarial finding. Sinuses/Orbits: Clear/normal Other: None significant IMPRESSION: Previous right frontal craniectomy for resolution of pressure. There is no longer any midline shift. There is no longer any hyperdense blood. Persistent edema in the right temporal and frontal region related to previous hemorrhagic contusion. Tiny amount of hyperdense blood remaining  visible on the occipital horn of left lateral ventricle. No hydrocephalus. Infarction in the cingulate gyrus on the left, probably secondary to the previous trans tentorial herniation. Electronically Signed   By: Paulina Fusi M.D.   On: 02/03/2017 15:18    Recent Labs  02/04/17 0428  WBC 5.6  HGB 9.3*  HCT 29.1*  PLT 317    Recent Labs  02/04/17 0428  NA 141  K 3.4*  CL 107  GLUCOSE 117*  BUN 15  CREATININE 0.47*  CALCIUM 8.1*   CBG (last 3)   Recent Labs  02/05/17 0003 02/05/17 0422 02/05/17 0850  GLUCAP 133* 96 113*    Wt Readings from Last 3 Encounters:  02/05/17 64.4 kg (141 lb 15.6 oz)  02/01/17 65.6 kg (144 lb 9.6 oz)  07/31/16 68 kg (150 lb)    Physical Exam:    Gen: pt with eyes open, slow to respond HENT:  Craniotomy site clean and dry. Nasogastric tube in place Eyes:  Pupils reactive to light Neck: Normal range of motion. Neck supple. No thyromegalypresent.  Cardiovascular: Reg rhythm, tachy  Respiratory:  Normal effort  GI: Soft. Bowel sounds are normal. He exhibits no distension. Flap site intact Neurological: He is alert.  Patient is awake, restless and distracted. Waved at me when I called his name.  Moves all 4's.  Constantly reaching and grasping at objects around him Psychiatric:   Distracted.restless Skin. Warm and dry  Assessment/Plan: 1. Cognitive and functional deficits secondary to right traumatic ICH/SDH s/p craniectomy which require 3+ hours per day of interdisciplinary therapy in a comprehensive inpatient rehab setting. Physiatrist is providing close team supervision and 24 hour management of active medical problems listed below. Physiatrist and rehab team continue to assess barriers to discharge/monitor patient progress toward functional and medical  goals.  Function:  Bathing Bathing position      Bathing parts      Bathing assist Assist Level: 2 helpers (per report 2+ assist to wash up to prevent tube pulling)       Upper Body Dressing/Undressing Upper body dressing   What is the patient wearing?: Hospital gown                Upper body assist Assist Level:  (total A)      Lower Body Dressing/Undressing Lower body dressing   What is the patient wearing?: Hospital Gown, Non-skid slipper socks                              Lower body assist Assist for lower body dressing: 2 Helpers      Toileting Toileting Toileting activity did not occur: No continent bowel/bladder event (hospital gown)   Toileting steps completed by helper: Adjust clothing prior to toileting, Performs perineal hygiene, Adjust clothing after toileting    Toileting assist Assist level:  (total)   Transfers Chair/bed transfer   Chair/bed transfer method: Squat pivot Chair/bed transfer assist level: 2 helpers Chair/bed transfer assistive device: Armrests, Bedrails     Locomotion Ambulation Ambulation activity did not occur: Safety/medical concerns         Wheelchair Wheelchair activity did not occur: Safety/medical concerns        Cognition Comprehension Comprehension assist level: Other (comment) (spanish speaking)  Expression Expression assist level: Expresses basis less than 25% of the time/requires cueing >75% of the time.  Social Interaction Social Interaction assist level: Interacts appropriately less than 25% of the time. May be withdrawn or combative.  Problem Solving Problem solving assist level: Solves basic problems with no assist, Solves basic less than 25% of the time - needs direction nearly all the time or does not effectively solve problems and may need a restraint for safety  Memory Memory assist level: Recognizes or recalls less than 25% of the time/requires cueing greater than 75% of the time  Medical Problem List and Plan: 1. Decreased functional mobility with cognitive deficitssecondary to right IPH/SDH/temporal bone fracture after fall S/P right frontal temporoparietal  craniectomy 01/14/2017 with placement of bone flap in the abdomen -continue CIR therapies---more alert and interactive today---now a RLAS IV  -PT,OT, SLP  -continues to require soft wrist restraints for safety 2. DVT Prophylaxis/Anticoagulation: Subcutaneous heparin. Monitor platelet counts and any signs of bleeding. Check vascular study 3. Pain Management: Hycet  4. Mood: Ativan as needed 5. Neuropsych: This patient is notcapable of making decisions on hisown behalf.  -sleep chart--improving patterns  -seroquel for sleep/agitation  -add seroquel during the day for agitation as this has worked well to settle him down at night  -consider b-blocker if needed  -limit benzos  -continue ritalin for arousal and initiation  -as far as suicidal ideations are concerned, this patient is so cognitively impaired at this time that not much can be drawn from these impulsive and confused gestures. Continue to ensure patient's safety and supervise as needed.  -soft restraints 6. Skin/Wound Care: Routine skin checks  -helmet for cranium protection  -flap site  intact 7. Fluids/Electrolytes/Nutrition: NGT for nutrition  -I personally reviewed all of the patient's labs today, and lab work is within normal limits. 8.Seizure prophylaxis. Keppra 750 mg twice daily 9.Pneumonia. Five-day course of Ancef  completed 01/22/2017 10.Dysphagia. CorTrak nasogastric tube for a traditional support.   -advance per  SLP 11.Urinary retention/Escherichia coli UTI. Held Urecholine.Completed course of Cipro.   -condom cath 12.Diabetes mellitus with peripheral neuropathy. Hemoglobin A1c 10.4. Lantus insulin 15 units twice a day. Patient on Glucophage 500 mg twice a day and Glucotrol 5 mg daily prior to admission. Resume as needed  -good control at present  13.History of hypertension. Patient on lisinopril 2.5 mg daily prior to admission. Resume as needed 14.Constipation. Laxative assistance  LOS (Days) 4 A  FACE TO FACE EVALUATION WAS PERFORMED  [name], MD 02/05/2017 9:50 AM

## 2017-02-05 NOTE — Progress Notes (Signed)
Occupational Therapy Session Note  Patient Details  Name: [name] MRN: [id number] Date of Birth: Jan 09, 1966  Today's Date: 02/05/2017 OT Individual Time: 1000-1104 OT Individual Time Calculation (min): 64 min  Cotreatment: 1300-1330 during 60 minute session (1300-1400)  Skilled Therapeutic Interventions/Progress Updates: Skilled OT session completed with focus on cognitive skills and sit<stands during BADL completion. Pt in w/c with RN at time of arrival. Interpretor arrived shortly afterwards. He completed bathing w/c level at sink, able to initiate task of washing legs and chest. Cues provided to attend all body areas, with pt able to follow these 1 step commands. Pt needing constant instruction or area of focus at all times, as pt demonstrated unsafe restless behaviors during times of waiting (leaning into sink or over armrest for retrieving hand mitts). He pulled out condom catheter.Tried to have him initiate threading pants. Pt held onto fabric with one hand and kept the other hand on w/c armrest and then shook his head. HOH utilized for finishing task. Sit<stand for pericare completed with 2 helpers facilitating transition via 3 muskateers, and a 3rd helper assisting with hygiene and donning new brief. Pt exhibited significant left pushing in standing, bilateral knees buckling. 2 helpers manually facilitated knee extension. Pt attempting to kiss one therapist during stands (redirection provided). Pt able to stand for 20 second windows of time. It required 3 sit<stands to complete tasks. Ideational apraxia noted with pt trying to consume deodorant, but he was able to complete with [facility] instruction of using it appropriately.  Afterwards pt able to thread 1 arm into new hospital gown. Then he was reclined in TIS, safety belt donned and bilateral mitts/wrist restraints were applied. Pt was left in care of RN at time of departure.      2nd Session 30 min cotreatment Pt was lying in bed at  time of arrival with interpretor present. Pt difficult to fully rouse with sternal rub and wet wash cloth applied to face. Brief change completed with 2 helpers Total A with HOH cues for hand placement during rolling R<L. When asked if pt wanted to don pants again, pt stared at therapist for 1 minute. Pt cued to give thumbs up/down sign and still did not indicate preference. HOH for pt to pull pants over knees while supine with decreased initiation compared to tx this AM. Pt educated on bridging technique via tactile and verbal cues. Total Ax2 for lifting pants over hips due to decreased participation by pt, limited by either behavior or cognition. 2 helpers for safety with sitting balance while therapists donned helmet. Pt often leaning far forward and pushing to left. Squat pivot<w/c completed with 2 helpers Total A. Pt was then escorted to gym for remainder of cotreatment.   Therapy Documentation Precautions:  Precautions Precautions: Fall Precaution Comments: NPO (NG tube), BUE mittens, bone flap removed R side of head Restrictions Weight Bearing Restrictions: No :   :   Pain: No indication of pain during sessions    ADL:  :    See Function Navigator for Current Functional Status.   Therapy/Group: Individual Therapy and Co-Treatment  Palin Tristan A Sanai Frick 02/05/2017, 12:43 PM

## 2017-02-05 NOTE — Plan of Care (Signed)
Problem: RH BLADDER ELIMINATION Goal: RH STG MANAGE BLADDER WITH EQUIPMENT WITH ASSISTANCE STG Manage Bladder With Equipment With Mod Assistance   Outcome: Not Progressing Total assist  Problem: RH SAFETY Goal: RH STG ADHERE TO SAFETY PRECAUTIONS W/ASSISTANCE/DEVICE STG Adhere to Safety Precautions With Max Assistance/Device.   Outcome: Not Progressing Requires restraint and telesitter.

## 2017-02-05 NOTE — Progress Notes (Signed)
Speech Language Pathology Daily Session Notes  Patient Details  Name: [name] MRN: [id number] Date of Birth: Jun 26, 1966  Today's Date: 02/05/2017 Session 1: SLP Individual Time: 1100-1155 SLP Individual Time Calculation (min): 55 min  Session 2: SLP Individual Time:   1400-1430  SLP Individual Time Calculation (min): 30 min   Short Term Goals: Week 1: SLP Short Term Goal 1 (Week 1): Patient will initiate a functional task with Max A multimodal cues. SLP Short Term Goal 2 (Week 1): Patient will follow 1 step commands in 25% of opportunities with Max A mutlimodal cues.  SLP Short Term Goal 3 (Week 1): Patient will verbally respond to questions in 25% of opportunities with Max A multimodal cues.  SLP Short Term Goal 4 (Week 1): Patient will consume trials of thin liquids with minimal overt s/s of aspiration in 25% of trials with Max A multimodal cues to assess readiness for MBS.  SLP Short Term Goal 5 (Week 1): Patient will consume trials of Dys. 1 textures with minimal overt s/s of aspiration in 25% of trials with Max A multimodal cues.   Skilled Therapeutic Interventions:  Session 1:  Skilled treatment session focused on dysphagia and cognitive goals. Upon arrival, patient was upright in the wheelchair but appeared restless. Patient constantly trying to pull at NG tube and scratch incision site on head when hands were not engaged in a task. Patient essentially nonverbal throughout session despite Max A multimodal cues. Patient consumed trials of ice chips and demonstrated minimal oral manipulation and prolonged AP transit due to decreased awareness of bolus secondary to lethargy and restlessness. Patient demonstrated what appeared to be a delayed swallow initiation with overt coughing. Recommend patient remain NPO at this time. Patient reported he needed to void while pulling at his pants, however, patient attempted to use urinal without success. Suspect due to decreased attention  to task. Patient followed 1 step commands in 25% of opportunities with Max A verbal cues and required Max A verbal cues to attend to tasks for ~30 second intervals. Patient was transferred back to bed at end of session with total +2 assist for safety. Patient asleep before clinician left room. Patient left supine in bed with alarm on, restraints in place and sister present. Continue with current plan of care.   Session 2: Skilled treatment session focused on dysphagia and cognitive goals. Upon arrival, patient was upright in the wheelchair but appeared restless. Patient constantly trying to pull at NG tube and scratch incision site on head when hands were not engaged in a task. Patient demonstrated increased verbal expression this session with extra time and Max A multimodal cues. Patient consumed trials of ice chips and sips of thin liquids via the tsp without overt s/s of aspiration but demonstrated what appeared to be a delayed swallow initiation. Patient demonstrated focused attention to tasks for ~30 second intervals and required total A to maintain an upright position in the wheelchair. Patient was transferred back to bed at end of session with total +2 assist for safety.  Patient left supine in bed with alarm on, restraints in place and sister present. Continue with current plan of care.     Function:  Eating Eating   Modified Consistency Diet: Yes Eating Assist Level: Helper feeds patient (with trials from SLP )           Cognition Comprehension Comprehension assist level: Understands basic less than 25% of the time/ requires cueing >75% of the time  Expression  Expression assist level: Expresses basis less than 25% of the time/requires cueing >75% of the time.  Social Interaction Social Interaction assist level: Interacts appropriately less than 25% of the time. May be withdrawn or combative.  Problem Solving Problem solving assist level: Solves basic problems with no assist;Solves  basic less than 25% of the time - needs direction nearly all the time or does not effectively solve problems and may need a restraint for safety  Memory Memory assist level: Recognizes or recalls less than 25% of the time/requires cueing greater than 75% of the time    Pain No/Denies Pain   Therapy/Group: Individual Therapy  [name] 02/05/2017, 12:22 PM

## 2017-02-06 ENCOUNTER — Inpatient Hospital Stay (HOSPITAL_COMMUNITY): Payer: Self-pay | Admitting: Occupational Therapy

## 2017-02-06 ENCOUNTER — Inpatient Hospital Stay (HOSPITAL_COMMUNITY): Payer: Self-pay | Admitting: Speech Pathology

## 2017-02-06 ENCOUNTER — Inpatient Hospital Stay (HOSPITAL_COMMUNITY): Payer: Self-pay

## 2017-02-06 DIAGNOSIS — E1165 Type 2 diabetes mellitus with hyperglycemia: Secondary | ICD-10-CM

## 2017-02-06 DIAGNOSIS — I10 Essential (primary) hypertension: Secondary | ICD-10-CM

## 2017-02-06 DIAGNOSIS — E1142 Type 2 diabetes mellitus with diabetic polyneuropathy: Secondary | ICD-10-CM

## 2017-02-06 DIAGNOSIS — R5383 Other fatigue: Secondary | ICD-10-CM

## 2017-02-06 LAB — GLUCOSE, CAPILLARY
GLUCOSE-CAPILLARY: 151 mg/dL — AB (ref 65–99)
GLUCOSE-CAPILLARY: 119 mg/dL — AB (ref 65–99)
Glucose-Capillary: 111 mg/dL — ABNORMAL HIGH (ref 65–99)
Glucose-Capillary: 117 mg/dL — ABNORMAL HIGH (ref 65–99)
Glucose-Capillary: 128 mg/dL — ABNORMAL HIGH (ref 65–99)
Glucose-Capillary: 77 mg/dL (ref 65–99)

## 2017-02-06 MED ORDER — ALTEPLASE 2 MG IJ SOLR
2.0000 mg | Freq: Once | INTRAMUSCULAR | Status: AC
  Administered 2017-02-06: 2 mg

## 2017-02-06 NOTE — Progress Notes (Signed)
Speech Language Pathology Daily Session Note  Patient Details  Name: [name] MRN: [id number] Date of Birth: [birth year]  Today's Date: 02/06/2017 SLP Individual Time: 0800-0900 SLP Individual Time Calculation (min): 60 min  Short Term Goals: Week 1: SLP Short Term Goal 1 (Week 1): Patient will initiate a functional task with Max A multimodal cues. SLP Short Term Goal 2 (Week 1): Patient will follow 1 step commands in 25% of opportunities with Max A mutlimodal cues.  SLP Short Term Goal 3 (Week 1): Patient will verbally respond to questions in 25% of opportunities with Max A multimodal cues.  SLP Short Term Goal 4 (Week 1): Patient will consume trials of thin liquids with minimal overt s/s of aspiration in 25% of trials with Max A multimodal cues to assess readiness for MBS.  SLP Short Term Goal 5 (Week 1): Patient will consume trials of Dys. 1 textures with minimal overt s/s of aspiration in 25% of trials with Max A multimodal cues.   Skilled Therapeutic Interventions: Skilled treatment session focused on dysphagia and cognition goals with assistance of interpreter. SLP facilitated session by providing Max A verbal and visual cues to follow 1 step directions within context of bed mobility to allow for nursing and SLP to clean pt's brief and bedding. With Max A cues, pt able to follow simple 1 step directions with >75% accuracy. Pt able to answer simple yes/no questions with increased wait time in 50% of opportunities for simple basic questions. SLP set up pt up for suction oral care and pt initiated with perfomed task with Min A. Pt required hand over hand assistance to terminate activity. When given trials of ice chips. Pt with difficulty orally containing ice chips. Small cups sips given with mild increase in oral prep time but on overt s/s of aspiration. Pt's voice remained clear. Pt consumed 1/2 container of applesauce with good oral attention, mild increased oral prep time of 2  seconds but no overt s/s of aspiration. As session was ending, pt with increased restlessness and multiple attempts to scratch head etc. Nursing arrived and helped SLP reapply restraints. Continue per current plan of care.      Function:  Eating Eating   Modified Consistency Diet: Yes (Trials with SLP) Eating Assist Level: Helper feeds patient           Cognition Comprehension Comprehension assist level: Understands basic less than 25% of the time/ requires cueing >75% of the time  Expression   Expression assist level: Expresses basis less than 25% of the time/requires cueing >75% of the time.  Social Interaction Social Interaction assist level: Interacts appropriately less than 25% of the time. May be withdrawn or combative.  Problem Solving Problem solving assist level: Solves basic less than 25% of the time - needs direction nearly all the time or does not effectively solve problems and may need a restraint for safety  Memory Memory assist level: Recognizes or recalls less than 25% of the time/requires cueing greater than 75% of the time    Pain    Therapy/Group: Individual Therapy   Casandra Dallaire B. Dreama Saa, M.S., CCC-SLP Speech-Language Pathologist   Trev Boley 02/06/2017, 12:42 PM

## 2017-02-06 NOTE — Progress Notes (Signed)
Patient is spanish speaking only. While attempting to give medication via NG tube, pt c/o to family member that he was having pain when medications were coming through tube. Family member refused further medications/flushes or nutrition to be given. On call provider notified with orders for KUB to confirm placement.

## 2017-02-06 NOTE — Progress Notes (Signed)
Occupational Therapy Session Note  Patient Details  Name: [name] MRN: [id number] Date of Birth: [birth year]  Today's Date: 02/06/2017 OT Individual Time: 4098-1191 and 1301-1330  OT Individual Time Calculation (min): 61 min and 29 min  Short Term Goals: Week 1:  OT Short Term Goal 1 (Week 1): Pt will sustain attention to self-care task for 2 mins OT Short Term Goal 2 (Week 1): Pt will initiate self-care task with mod cues OT Short Term Goal 3 (Week 1): Pt will complete bathing with max assist OT Short Term Goal 4 (Week 1): Pt will maintain sitting balance with min assist for 5 mins to engage in self-care task OT Short Term Goal 5 (Week 1): Pt will complete toilet transfer with max assist of 1 caregiver  Skilled Therapeutic Interventions/Progress Updates: Pt was lying in bed at time of arrival with interpretor present. With max multimodal cues, pt transitioned to sitting at EOB with 2 helpers due to decreased initiation. 2 helpers for squat pivot to w/c, 1 helper facilitating forward weight shift and the other guiding hips to chair. Tx focus on initiation, following 1 step instructions and sit<stands during BADL session. Pt completed bathing/dressing w/c level at sink. Pt initiated 2/10 bathing tasks today when presented wash cloth, required 1 step instruction for UB/LB or tactile cuing. Max redirectional cues required for pt to not pull on medical lines. Sit<stand for pericare completed with 2 helpers, manual facilitation for upright trunk and knee extension. Pt followed cue to look at mirror for maintaining forward gaze. At end of tx pt was reclined in TIS with hand mitts and wrist restraints applied. He was left at RN station with safety belt and helmet donned.      2nd Session 1:1 tx (29 min) Pt was lying in bed with sister and interpretor present at time of arrival. Supine<sit completed with extra time and multimodal cues for pt to participate. Once at EOB, had pt engage in  simple connect 4 game, focusing on initiation and following 1 step commands. Max-Total A for balance maintenance. Pt required [facility] for bringing L UE closer to trunk for upright stability. Pt exhibited difficultly with attending to task and recalling 3 rules explained at start of game. Pt required gentle tactile cues for not pulling on medical lines. He tried pulling off helmet x3. At end of tx pt was returned to supine. Hand mitts and wrist restraints were applied and bed alarm activated. Pt was left with sister at time of departure.    Therapy Documentation Precautions:  Precautions Precautions: Fall Precaution Comments: NPO (NG tube), BUE mittens, bone flap removed R side of head Restrictions Weight Bearing Restrictions: No General: General PT Missed Treatment Reason: Patient fatigue Vital Signs:   Pain: Pt shakes his head "no" when asked if he has pain   ADL:  :    See Function Navigator for Current Functional Status.   Therapy/Group: Individual Therapy  Ziona Wickens A Mehreen Azizi 02/06/2017, 12:38 PM

## 2017-02-06 NOTE — Progress Notes (Signed)
Physical Therapy Session Note  Patient Details  Name: [name] MRN: [id number] Date of Birth: 12-23-1965  Today's Date: 02/06/2017 PT Individual Time: 1100-1154 PT Individual Time Calculation (min): 54 min   Short Term Goals: Week 1:  PT Short Term Goal 1 (Week 1): Pt will initiate rolling L/R and supine<>sitting with max assist. PT Short Term Goal 2 (Week 1): Pt will transfer bed<>w/c with max assist +1. PT Short Term Goal 3 (Week 1): Pt will demonstrate sustained attention in controlled environment for 2 minutes with supervision.  Skilled Therapeutic Interventions/Progress Updates:    Session focused on functional mobility and neuro re-ed during transfers, bed mobility, unsupported static and dynamic sitting balance, postural control, and cognitive remediation. Pt requires total +2 for squat pivot transfers with max cues for sequencing and initiation - pt able to initiate today more accurate hand placement during transfer and trunk control but unable to sustain and did not initiate actual lift off through BLE. Functional dynamic sitting balance and postural control re-training seated EOB with min to total assist during ball toss activity and kicking ball - pt able to sustain attention to these tasks for about 10 min total.  Several attempts for sit <> stand with +2 assist but pt unable to come into full standing position. Pt does continue to attempt to pull off helmet and itch his nose (unable to decipher if patient actually attempting to pull NG tube vs. Scratching at the area due to it being bothersome). Pt following simple commands more consistently this session. Sister present to observe and provide encouragement. Pt becoming fatigued at end of session and after returned to bed (pt initially initiated bring RLE on to the bed to get into supine but then started falling asleep and required max assist) pt falling sleep. Restraints reapplied and family at bedside.   Therapy  Documentation Precautions:  Precautions Precautions: Fall Precaution Comments: NPO (NG tube), BUE mittens, bone flap removed R side of head Restrictions Weight Bearing Restrictions: No General: PT Amount of Missed Time (min): 6 Minutes PT Missed Treatment Reason: Patient fatigue Pain:  no reports of pain - sister reports the NG tube is painful when liquids administered this morning - plan to have x-ray done.    See Function Navigator for Current Functional Status.   Therapy/Group: Individual Therapy  Karolee Stamps Darrol Poke, PT, DPT  02/06/2017, 12:11 PM

## 2017-02-06 NOTE — Progress Notes (Signed)
[place] PHYSICAL MEDICINE & REHABILITATION     PROGRESS NOTE    Subjective/Complaints: Pt seen laying in bed this AM.  He is somnolent and not following commands.     ROS: Unable to obtain due to cognitive/mental status issues.  Objective: Vital Signs: Blood pressure 110/70, pulse 89, temperature 97.6 F (36.4 C), temperature source Axillary, resp. rate 18, height  (1.575 m), weight 61.8 kg (136 lb 3.9 oz), SpO2 94 %. No results found.  Recent Labs  02/04/17 0428  WBC 5.6  HGB 9.3*  HCT 29.1*  PLT 317    Recent Labs  02/04/17 0428  NA 141  K 3.4*  CL 107  GLUCOSE 117*  BUN 15  CREATININE 0.47*  CALCIUM 8.1*   CBG (last 3)   Recent Labs  02/06/17 0758 02/06/17 1155 02/06/17 1624  GLUCAP 151* 117* 77    Wt Readings from Last 3 Encounters:  02/06/17 61.8 kg (136 lb 3.9 oz)  02/01/17 65.6 kg (144 lb 9.6 oz)  07/31/16 68 kg (150 lb)    Physical Exam:    Gen: NAD. Well developed.  HENT: Craniotomy site clean and dry. +NG Cardiovascular: RRR. No JVD. Respiratory:  Normal effort. Clear GI: Soft. Bowel sounds are normal. Neurological: He is alert.  Patient is somnolent Moves all 4's.   Psychiatric:   Unable to assess due to cognition.  Skin. Warm and dry. Intact.   Assessment/Plan: 1. Cognitive and functional deficits secondary to right traumatic ICH/SDH s/p craniectomy which require 3+ hours per day of interdisciplinary therapy in a comprehensive inpatient rehab setting. Physiatrist is providing close team supervision and 24 hour management of active medical problems listed below. Physiatrist and rehab team continue to assess barriers to discharge/monitor patient progress toward functional and medical goals.  Function:  Bathing Bathing position   Position: Wheelchair/chair at sink  Bathing parts Body parts bathed by patient: Right upper leg, Left upper leg, Abdomen, Chest Body parts bathed by helper: Buttocks, Right lower leg, Left lower  leg, Left arm, Right arm  Bathing assist Assist Level:  (3 helpers, 2 facilitating sit<stand and 1 assisting with pericare)      Upper Body Dressing/Undressing Upper body dressing   What is the patient wearing?: Hospital gown                Upper body assist Assist Level:  (Max A)      Lower Body Dressing/Undressing Lower body dressing   What is the patient wearing?: Pants, Non-skid slipper socks       Pants- Performed by helper: Thread/unthread right pants leg, Thread/unthread left pants leg, Pull pants up/down   Non-skid slipper socks- Performed by helper: Don/doff right sock, Don/doff left sock                  Lower body assist Assist for lower body dressing:  (3 helpers, 2 facilitating sit<stand and 1 helper lifting pants over hips)      Toileting Toileting Toileting activity did not occur: No continent bowel/bladder event (hospital gown)   Toileting steps completed by helper: Adjust clothing prior to toileting, Performs perineal hygiene, Adjust clothing after toileting    Toileting assist Assist level:  (total)   Transfers Chair/bed transfer   Chair/bed transfer method: Squat pivot Chair/bed transfer assist level: 2 helpers Chair/bed transfer assistive device: Armrests     Locomotion Ambulation Ambulation activity did not occur: Safety/medical Investment banker, operational activity did  not occur: Safety/medical concerns     Assist Level: Dependent (Pt equals 0%) (TIS)  Cognition Comprehension Comprehension assist level: Understands basic less than 25% of the time/ requires cueing >75% of the time  Expression Expression assist level: Expresses basis less than 25% of the time/requires cueing >75% of the time.  Social Interaction Social Interaction assist level: Interacts appropriately less than 25% of the time. May be withdrawn or combative.  Problem Solving Problem solving assist level: Solves basic less than 25% of the time - needs  direction nearly all the time or does not effectively solve problems and may need a restraint for safety  Memory Memory assist level: Recognizes or recalls less than 25% of the time/requires cueing greater than 75% of the time  Medical Problem List and Plan: 1. 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  2. DVT Prophylaxis/Anticoagulation: Subcutaneous heparin. Monitor platelet counts and any signs of bleeding. 3. Pain Management: Hycet  4. Mood: Ativan as needed 5. Neuropsych: This patient is notcapable of making decisions on hisown behalf.  -sleep chart--improving patterns overall  -seroquel for sleep/agitation  -added seroquel during the day for agitation as this has worked well to settle him down at night  -consider b-blocker if needed  -limit benzos  -continue ritalin for arousal and initiation  -Continue to ensure patient's safety and supervise as needed.  -soft restraints 6. Skin/Wound Care: Routine skin checks  -helmet for cranium protection  -flap site  intact 7. Fluids/Electrolytes/Nutrition: NGT for nutrition 8.Seizure prophylaxis. Keppra 750 mg twice daily 9.Pneumonia. Five-day course of Ancef  completed 01/22/2017 10.Dysphagia. CorTrak nasogastric tube for a traditional support.   -advance per SLP  KUB reviewed, 4/7, NG at duodenal jejunal junction 11.Urinary retention/Escherichia coli UTI. Held Urecholine.Completed course of Cipro.   -condom cath 12.Diabetes mellitus with peripheral neuropathy. Hemoglobin A1c 10.4. Lantus insulin 15 units twice a day. Patient on Glucophage 500 mg twice a day and Glucotrol 5 mg daily   Relatively controlled 4/7 13.History of hypertension. Patient on lisinopril 2.5 mg daily prior to admission. Resume  as needed  Relatively controlled 4/7 14.Constipation. Laxative assistance  LOS (Days) 5 A FACE TO FACE EVALUATION WAS PERFORMED  Ankit Karis Juba, MD 02/06/2017 5:05 PM

## 2017-02-06 NOTE — Plan of Care (Signed)
Problem: RH BLADDER ELIMINATION Goal: RH STG MANAGE BLADDER WITH ASSISTANCE STG Manage Bladder With Mod Assistance   Outcome: Not Progressing Incontinent/condom cath  Problem: RH SAFETY Goal: RH STG ADHERE TO SAFETY PRECAUTIONS W/ASSISTANCE/DEVICE STG Adhere to Safety Precautions With Max Assistance/Device.   Outcome: Not Progressing Requires telesitter, bed/chair alarms

## 2017-02-07 ENCOUNTER — Inpatient Hospital Stay (HOSPITAL_COMMUNITY): Payer: Self-pay | Admitting: Physical Therapy

## 2017-02-07 DIAGNOSIS — R451 Restlessness and agitation: Secondary | ICD-10-CM

## 2017-02-07 DIAGNOSIS — I10 Essential (primary) hypertension: Secondary | ICD-10-CM

## 2017-02-07 LAB — GLUCOSE, CAPILLARY
GLUCOSE-CAPILLARY: 156 mg/dL — AB (ref 65–99)
GLUCOSE-CAPILLARY: 100 mg/dL — AB (ref 65–99)
GLUCOSE-CAPILLARY: 127 mg/dL — AB (ref 65–99)
GLUCOSE-CAPILLARY: 114 mg/dL — AB (ref 65–99)
Glucose-Capillary: 108 mg/dL — ABNORMAL HIGH (ref 65–99)

## 2017-02-07 MED ORDER — FREE WATER
100.0000 mL | Freq: Four times a day (QID) | Status: DC
  Administered 2017-02-07 – 2017-02-09 (×10): 100 mL

## 2017-02-07 NOTE — Progress Notes (Addendum)
Patient got restless and agitated close to 7 am. He was trying to bite on his mitten and remove them. He also pulled his condom catheter as well. RN removed one of his mitten and he tried to reach his nose to pull the tube. Educated patient that he is not suppose to remove his feeding tube. Ativan IV prn given. Report given to day shift rn.

## 2017-02-07 NOTE — Progress Notes (Signed)
[place] PHYSICAL MEDICINE & REHABILITATION     PROGRESS NOTE    Subjective/Complaints: Patient seen lying in bed this morning. States that he slept well. Confirmed with nursing. He inconsistently follows commands and responds.  ROS: Unable to obtain due to cognitive/mental status issues.  Objective: Vital Signs: Blood pressure 118/79, pulse 95, temperature 98.3 F (36.8 C), temperature source Axillary, resp. rate 18, height  (1.575 m), weight 60.4 kg (133 lb 2.5 oz), SpO2 99 %. Dg Abd Portable 1v  Result Date: 02/06/2017 CLINICAL DATA:  Abdominal pain, NG tube placement. EXAM: PORTABLE ABDOMEN - 1 VIEW COMPARISON:  01/21/2017 and 01/19/2017 FINDINGS: Enteric tube is present with tip projecting over the left mid abdomen likely at the duodenojejunal junction. Bowel gas pattern is nonobstructive. Remainder of the exam is unchanged. IMPRESSION: Nonobstructive bowel gas pattern. Enteric tube with tip over the left mid abdomen likely at the duodenojejunal junction. Electronically Signed   By: Elberta Fortis M.D.   On: 02/06/2017 17:22   No results for input(s): WBC, HGB, HCT, PLT in the last 72 hours. No results for input(s): NA, K, CL, GLUCOSE, BUN, CREATININE, CALCIUM in the last 72 hours.  Invalid input(s): CO CBG (last 3)   Recent Labs  02/06/17 2017 02/06/17 2357 02/07/17 0404  GLUCAP 119* 128* 108*    Wt Readings from Last 3 Encounters:  02/07/17 60.4 kg (133 lb 2.5 oz)  02/01/17 65.6 kg (144 lb 9.6 oz)  07/31/16 68 kg (150 lb)    Physical Exam:    Gen: NAD. Well developed. Frail. HENT: Craniotomy site clean and dry. +NG Cardiovascular: RRR. No JVD. Respiratory:  Normal effort. Clear GI: Soft. Bowel sounds are normal. + NGT.  Neurological: He is alert.  Patient is somnolent Moves all 4's per report.   Psychiatric:   Unable to assess due to cognition.  Skin. Warm and dry. Intact.   Assessment/Plan: 1. Cognitive and functional deficits secondary to right  traumatic ICH/SDH s/p craniectomy which require 3+ hours per day of interdisciplinary therapy in a comprehensive inpatient rehab setting. Physiatrist is providing close team supervision and 24 hour management of active medical problems listed below. Physiatrist and rehab team continue to assess barriers to discharge/monitor patient progress toward functional and medical goals.  Function:  Bathing Bathing position   Position: Wheelchair/chair at sink  Bathing parts Body parts bathed by patient: Right upper leg, Left upper leg, Abdomen, Chest, Left arm Body parts bathed by helper: Right arm, Front perineal area, Buttocks, Right lower leg, Back, Left lower leg  Bathing assist Assist Level: 2 helpers      Upper Body Dressing/Undressing Upper body dressing   What is the patient wearing?: Hospital gown                Upper body assist Assist Level: Touching or steadying assistance(Pt > 75%)      Lower Body Dressing/Undressing Lower body dressing   What is the patient wearing?: Pants, Non-skid slipper socks, Ted Hose       Pants- Performed by helper: Thread/unthread right pants leg, Thread/unthread left pants leg, Pull pants up/down   Non-skid slipper socks- Performed by helper: Don/doff right sock, Don/doff left sock               TED Hose - Performed by helper: Don/doff right TED hose, Don/doff left TED hose  Lower body assist Assist for lower body dressing: 2 Helpers      Toileting Toileting Toileting activity did not occur: No  continent bowel/bladder event (hospital gown)   Toileting steps completed by helper: Adjust clothing prior to toileting, Performs perineal hygiene, Adjust clothing after toileting    Toileting assist Assist level:  (total)   Transfers Chair/bed transfer   Chair/bed transfer method: Squat pivot Chair/bed transfer assist level: 2 helpers Chair/bed transfer assistive device: Armrests     Locomotion Ambulation Ambulation activity did not  occur: Safety/medical Investment banker, operational activity did not occur: Safety/medical concerns     Assist Level: Dependent (Pt equals 0%) (TIS)  Cognition Comprehension Comprehension assist level: Understands basic less than 25% of the time/ requires cueing >75% of the time  Expression Expression assist level: Expresses basis less than 25% of the time/requires cueing >75% of the time.  Social Interaction Social Interaction assist level: Interacts appropriately less than 25% of the time. May be withdrawn or combative.  Problem Solving Problem solving assist level: Solves basic less than 25% of the time - needs direction nearly all the time or does not effectively solve problems and may need a restraint for safety  Memory Memory assist level: Recognizes or recalls less than 25% of the time/requires cueing greater than 75% of the time  Medical Problem List and Plan: 1. 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 DVT Prophylaxis/Anticoagulation: Subcutaneous heparin. Monitor platelet counts and any signs of bleeding. 3. Pain Management: Hycet  4. Mood: Ativan as needed 5. Neuropsych: This patient is notcapable of making decisions on hisown behalf.  -sleep chart--improving patterns overall  -seroquel for sleep/agitation  -added seroquel during the day for agitation as this has worked well to settle him down at night  -consider b-blocker if needed  -limit benzos  -continue ritalin for arousal and initiation  -Continue to ensure patient's safety and supervise as needed.  -soft restraints 6. Skin/Wound Care: Routine skin checks  -helmet for cranium protection  -flap site  intact 7. Fluids/Electrolytes/Nutrition: NGT for nutrition  Water flushes added 8.Seizure prophylaxis.  Keppra 750 mg twice daily 9.Pneumonia. Five-day course of Ancef  completed 01/22/2017 10.Dysphagia. CorTrak nasogastric tube for a traditional support.   -advance per SLP  KUB reviewed, 4/7, NG at duodenal jejunal junction 11.Urinary retention/Escherichia coli UTI. Held Urecholine.Completed course of Cipro.   -condom cath 12.Diabetes mellitus with peripheral neuropathy. Hemoglobin A1c 10.4. Lantus insulin 15 units twice a day. Patient on Glucophage 500 mg twice a day and Glucotrol 5 mg daily   Relatively controlled 4/8 13.History of hypertension. Patient on lisinopril 2.5 mg daily prior to admission. Resume as needed  Relatively controlled 4/8 14.Constipation. Laxative assistance  LOS (Days) 6 A FACE TO FACE EVALUATION WAS PERFORMED  Anntoinette Haefele Karis Juba, MD 02/07/2017 8:01 AM

## 2017-02-07 NOTE — Progress Notes (Signed)
Physical Therapy Session Note  Patient Details  Name: [name] MRN: [id number] Date of Birth: 08-19-1966  Today's Date: 02/07/2017 PT Individual Time: 1007-1101 PT Individual Time Calculation (min): 54 min   Short Term Goals: Week 1:  PT Short Term Goal 1 (Week 1): Pt will initiate rolling L/R and supine<>sitting with max assist. PT Short Term Goal 2 (Week 1): Pt will transfer bed<>w/c with max assist +1. PT Short Term Goal 3 (Week 1): Pt will demonstrate sustained attention in controlled environment for 2 minutes with supervision.  Skilled Therapeutic Interventions/Progress Updates:  Pt received in bed with family present to observe session. Pt able to initiate rolling L/R with max assist to allow therapist & NT to don new brief. Pt transferred supine<>sitting EOB with total assist as pt unable to initiate movement despite max multimodal cuing. Pt transferred bed<>w/c and w/c<>mat table during session with +2 assist. Pt requires max multimodal cuing for head/hips relationship and to initiate movement. Pt only assisted with transfer on one occasion, back to bed at end of session, otherwise he requires total assist. In gym pt tolerated sitting on EOM for ~20 minutes with +2 total assist as pt constantly pulling forward on EOM, reporting he is fearful of falling, despite max encouragement and education to not hold EOM and for upright posture. Pt frequently asking for water during session despite max cuing regarding NG tube; therapist did use moistened mouth swab to provide moisture to pt's mouth. Throughout session pt required frequent cuing to not pull at NG tube or attempt to remove helmet. Pt easily distracted throughout session and with difficulty following one step commands. At end of session pt left in bed with BUE mittens & restraints donned, bed alarm set, all needs within reach, & family present to supervise.   Minimal movement noted in pt's LLE today but therapist did not  observe any movement in RLE.    Therapy Documentation Precautions:  Precautions Precautions: Fall Precaution Comments: NPO (NG tube), BUE mittens, bone flap removed R side of head Restrictions Weight Bearing Restrictions: No  Pain: Faces - no pain.   See Function Navigator for Current Functional Status.   Therapy/Group: Individual Therapy  Sandi Mariscal 02/07/2017, 12:18 PM

## 2017-02-07 NOTE — Progress Notes (Signed)
Dr. Allena Katz notified of patient's intermittent dry cough when he is awake, coughed once or twice during the night shift. Family also reported that he has been frequently having dry cough as well. Bilateral lung sounds clear/diminished. Patient slept all night.This morning, he verbalized that he was hungry. Needs attended and made patient comfortable in bed.

## 2017-02-08 ENCOUNTER — Inpatient Hospital Stay (HOSPITAL_COMMUNITY): Payer: Self-pay | Admitting: Occupational Therapy

## 2017-02-08 ENCOUNTER — Inpatient Hospital Stay (HOSPITAL_COMMUNITY): Payer: Self-pay | Admitting: Physical Therapy

## 2017-02-08 ENCOUNTER — Inpatient Hospital Stay (HOSPITAL_COMMUNITY): Payer: Self-pay | Admitting: Speech Pathology

## 2017-02-08 LAB — GLUCOSE, CAPILLARY
GLUCOSE-CAPILLARY: 119 mg/dL — AB (ref 65–99)
GLUCOSE-CAPILLARY: 116 mg/dL — AB (ref 65–99)
Glucose-Capillary: 130 mg/dL — ABNORMAL HIGH (ref 65–99)
Glucose-Capillary: 130 mg/dL — ABNORMAL HIGH (ref 65–99)
Glucose-Capillary: 147 mg/dL — ABNORMAL HIGH (ref 65–99)
Glucose-Capillary: 180 mg/dL — ABNORMAL HIGH (ref 65–99)

## 2017-02-08 LAB — BASIC METABOLIC PANEL
Anion gap: 7 (ref 5–15)
BUN: 12 mg/dL (ref 6–20)
CHLORIDE: 104 mmol/L (ref 101–111)
CO2: 27 mmol/L (ref 22–32)
Calcium: 8.7 mg/dL — ABNORMAL LOW (ref 8.9–10.3)
Creatinine, Ser: 0.45 mg/dL — ABNORMAL LOW (ref 0.61–1.24)
GFR calc Af Amer: >60 mL/min (ref >60)
GFR calc non Af Amer: >60 mL/min (ref >60)
Glucose, Bld: 132 mg/dL — ABNORMAL HIGH (ref 65–99)
POTASSIUM: 3.6 mmol/L (ref 3.5–5.1)
Sodium: 138 mmol/L (ref 135–145)

## 2017-02-08 LAB — CBC
HEMATOCRIT: 33 % — AB (ref 39.0–52.0)
Hemoglobin: 10.7 g/dL — ABNORMAL LOW (ref 13.0–17.0)
MCH: 28.5 pg (ref 26.0–34.0)
MCHC: 32.4 g/dL (ref 30.0–36.0)
MCV: 87.8 fL (ref 78.0–100.0)
Platelets: 312 10*3/uL (ref 150–400)
RBC: 3.76 MIL/uL — AB (ref 4.22–5.81)
RDW: 13.8 % (ref 11.5–15.5)
WBC: 4 10*3/uL (ref 4.0–10.5)

## 2017-02-08 NOTE — Progress Notes (Signed)
Speech Language Pathology Daily Session Note  Patient Details  Name: [name] MRN: [id number] Date of Birth: Aug 04, 1966  Today's Date: 02/08/2017 SLP Individual Time: 1000-1100 SLP Individual Time Calculation (min): 60 min  Short Term Goals: Week 1: SLP Short Term Goal 1 (Week 1): Patient will initiate a functional task with Max A multimodal cues. SLP Short Term Goal 2 (Week 1): Patient will follow 1 step commands in 25% of opportunities with Max A mutlimodal cues.  SLP Short Term Goal 3 (Week 1): Patient will verbally respond to questions in 25% of opportunities with Max A multimodal cues.  SLP Short Term Goal 4 (Week 1): Patient will consume trials of thin liquids with minimal overt s/s of aspiration in 25% of trials with Max A multimodal cues to assess readiness for MBS.  SLP Short Term Goal 5 (Week 1): Patient will consume trials of Dys. 1 textures with minimal overt s/s of aspiration in 25% of trials with Max A multimodal cues.   Skilled Therapeutic Interventions: Skilled treatment session focused on cognitive and dysphagia goals. Upon arrival, patient was supine in bed and had been incontinent of bowel. Patient followed 1 step commands during self-care tasks with extra time and Mod A verbal and visual cues. Patient was transferred to the wheelchair with total +2 assist for safety and to maximize arousal. Patient exteremly restless while sitting upright in wheelchair with constantly trying to pull at NG tube and scratch his incision site. Patient followed 1 step commands in 25% of opportunities during self care tasks (oral care, washing face, etc) with Max A multimodal cues and threw the washcloth on the floor when asked to wipe his face. Patient consumed trials of thin liquids via cup and Dys. 1 textures. Patient demonstrated what appeared to be a delayed swallow initiation with all trials but without overt s/s of aspiration. Recommend MBS tomorrow to assess swallow function.  Patient's sister present and agreed with plan. Patient handed off to OT. Continue with current plan of care.      Function:  Eating Eating   Modified Consistency Diet: Yes Eating Assist Level: Helper feeds patient           Cognition Comprehension Comprehension assist level: Understands basic less than 25% of the time/ requires cueing >75% of the time  Expression   Expression assist level: Expresses basis less than 25% of the time/requires cueing >75% of the time.  Social Interaction Social Interaction assist level: Interacts appropriately less than 25% of the time. May be withdrawn or combative.  Problem Solving Problem solving assist level: Solves basic less than 25% of the time - needs direction nearly all the time or does not effectively solve problems and may need a restraint for safety  Memory Memory assist level: Recognizes or recalls less than 25% of the time/requires cueing greater than 75% of the time    Pain No/Denies Pain   Therapy/Group: Individual Therapy  [name] 02/08/2017, 3:41 PM

## 2017-02-08 NOTE — Progress Notes (Addendum)
Physical Therapy Session Note  Patient Details  Name: [name] MRN: [id number] Date of Birth: [birth year]  Today's Date: 02/08/2017 PT Individual Time: 1330-1430 PT Individual Time Calculation (min): 60 min  and Today's Date: 02/08/2017 PT Co-Treatment Time: 1315-1330 PT Co-Treatment Time Calculation (min): 15 min   Skilled Therapeutic Interventions/Progress Updates:  Pt seen initially for co-tx session with OT, then for individual session. Pt sister present to observe first half of session but then exited, reporting she felt she was distracting pt. Pt initiated supine>sitting movement with encouragement on this date, but required much less assistance as compared to this morning. Pt tolerated sitting on EOB ~10 minutes with min<>total assist while engaging in task requiring him to toss bean bags & catch a ball. Pt able to attend to task with supervision in controlled environment. Pt able to correctly read/identify foods labeled on bean bags. Pt reports he is R hand dominant and pt with L inattention. Attempted to encourage pt to toss bean bags with LUE but pt not participating and instead moving object to RUE. Pt transferred to w/c and engaged in ball toss demonstrating impaired coordination and inattention of LUE. OT exited session and remainder of session focused on cognitive remediation, specifically one-step commands, attention to task, and L attention. In dayroom pt engaged in activity focusing on one step commands with pt able to attend and correctly complete task ~25% of the time. Pt only able to select correct cup from choice of two, 25% of the time. Pt easily distracted during task. Pt able to write full name and recall birthday, but unable to recall his correct age. Pt able to copy shapes correctly 80% of the time but only able to correctly recall name of 1 shape. Pt engaged in peg board task with pre-selected pieces but pt unable to complete pattern despite max multimodal cuing &  instruction. Throughout session pt requires max cuing to not attempt to drink out of cups, pull on NG tube or PICC line, to not push helmot off, and to not eat pegs. At end of session pt assisted back to bed with +2 total assist via squat pivot. Pt requires max cuing to initiate sit>supine. At end of session pt left in bed with all needs within reach, bed alarm set, & BUE mittens & restraints donned.   Pt not as restless during afternoon session as he was during morning session.   Addendum: Pt also engaged in dynavision with door closed for quiet, controlled environment. Pt able to attend to task for 30-60 seconds at a time with moderate cuing to locate & press lights. Pt with improving willingness to utilize LUE as task progressed but pt continues to demonstrate impaired attention to L environment.   Therapy Documentation Precautions:  Precautions Precautions: Fall Precaution Comments: NPO (NG tube), BUE mittens, bone flap removed R side of head Restrictions Weight Bearing Restrictions: No   See Function Navigator for Current Functional Status.   Therapy/Group: Individual Therapy  Sandi Mariscal 02/08/2017, 5:08 PM

## 2017-02-08 NOTE — Progress Notes (Signed)
Physical Therapy Weekly Progress Note  Patient Details  Name: [name] MRN: [id number] Date of Birth: 06-Jan-1966  Beginning of progress report period: February 03, 2016 End of progress report period: February 09, 2017  Today's Date: 02/09/2017  Patient has met 2 of 3 short term goals and is making slow progress towards long term goals. Pt continues to require +2 assist for squat pivot transfers w/c<>bed and max/total assist for bed mobility. Pt also requires significant assistance for sitting balance. Pt demonstrates inconsistent sustained attention to tasks during sessions. Pt would benefit from continued skilled PT treatment to focus on cognitive remediation, bed mobility, transfers, sitting balance, and to progress to standing and ambulation.   Patient continues to demonstrate the following deficits muscle weakness, decreased cardiorespiratoy endurance, impaired timing and sequencing and decreased coordination, decreased visual perceptual skills, decreased attention to left, decreased initiation, decreased attention, decreased awareness, decreased problem solving, decreased safety awareness, decreased memory and delayed processing, and decreased sitting balance, decreased standing balance, decreased postural control and decreased balance strategies and therefore will continue to benefit from skilled PT intervention to increase functional independence with mobility.  Patient progressing toward long term goals..  Continue plan of care.  PT Short Term Goals Week 1:  PT Short Term Goal 1 (Week 1): Pt will initiate rolling L/R and supine<>sitting with max assist. PT Short Term Goal 1 - Progress (Week 1): Met PT Short Term Goal 2 (Week 1): Pt will transfer bed<>w/c with max assist +1. PT Short Term Goal 2 - Progress (Week 1): Not met PT Short Term Goal 3 (Week 1): Pt will demonstrate sustained attention in controlled environment for 2 minutes with supervision. PT Short Term Goal 3 -  Progress (Week 1): Met (not consistently, but has met on one occasion) Week 2:  PT Short Term Goal 1 (Week 2): Pt will consistently participate in supine<>sitting with max assist +1. PT Short Term Goal 2 (Week 2): Pt will stand with max assist +1. PT Short Term Goal 3 (Week 2): Pt will consistently participate in bed<>w/c transfers with max assist +1.    Therapy Documentation Precautions:  Precautions Precautions: Fall Precaution Comments: NPO (NG tube), BUE mittens, bone flap removed R side of head Restrictions Weight Bearing Restrictions: No   See Function Navigator for Current Functional Status.   [name] 02/09/2017, 7:56 AM

## 2017-02-08 NOTE — Progress Notes (Signed)
Physical Therapy Session Note  Patient Details  Name: [name] MRN: [id number] Date of Birth: 1966-09-11  Today's Date: 02/08/2017 PT Individual Time: 0803-0857 PT Individual Time Calculation (min): 54 min   Skilled Therapeutic Interventions/Progress Updates:  Pt received asleep in bed but awakened with max multimodal stimulation. Observed pt to be incontinent of bowel & bladder and pt rolled L<>R with max assist to initiate movement to allow therapist and rehab tech to perform peri-hygiene total assist. Pt unable to initiate BLE movement/placement to assist with rolling, but later pt voluntarily moved BLE in bed. Pt transferred supine<>siting EOB with +2 total assist as pt not initiating or participating in movement. Pt transferred bed<>w/c and w/c<>mat table with +2 total assist with max tactile cuing for anterior weight shift and sequencing, without any participation from pt. In gym pt tolerated sitting on EOM ~10 minutes with +2 total assist as pt constantly pulling forward on edge of mat, reaching for objects, and leaning forward. Pt resistive to therapist assisting him obtain & maintian upright sitting posture even with use of mirror for visual feedback. Assisted pt back to w/c & pt still restless, reaching forward for leg rests. Pt assisted back to bed & his sister [name]) entered room. Educated [name] on need for pt to have quiet time with minimal stimulation to help him relax until his next therapy session.  Pt left in bed with BUE mittens & restraints donned, all needs within reach, & bed alarm set.   Therapy Documentation Precautions:  Precautions Precautions: Fall Precaution Comments: NPO (NG tube), BUE mittens, bone flap removed R side of head Restrictions Weight Bearing Restrictions: No   Pain: Faces -  No pain.   See Function Navigator for Current Functional Status.   Therapy/Group: Individual Therapy  Sandi Mariscal 02/08/2017, 10:50 AM

## 2017-02-08 NOTE — Progress Notes (Signed)
[place] PHYSICAL MEDICINE & REHABILITATION     PROGRESS NOTE    Subjective/Complaints: Pt lying in bed. Therapy in room. Through translator pt denies any complaints. States his mood is ok  ROS: Limited due cognitive/behavioral  Objective: Vital Signs: Blood pressure 115/74, pulse 94, temperature 98.2 F (36.8 C), temperature source Axillary, resp. rate 18, height  (1.575 m), weight 60.1 kg (132 lb 7.9 oz), SpO2 98 %. Dg Abd Portable 1v  Result Date: 02/06/2017 CLINICAL DATA:  Abdominal pain, NG tube placement. EXAM: PORTABLE ABDOMEN - 1 VIEW COMPARISON:  01/21/2017 and 01/19/2017 FINDINGS: Enteric tube is present with tip projecting over the left mid abdomen likely at the duodenojejunal junction. Bowel gas pattern is nonobstructive. Remainder of the exam is unchanged. IMPRESSION: Nonobstructive bowel gas pattern. Enteric tube with tip over the left mid abdomen likely at the duodenojejunal junction. Electronically Signed   By: Elberta Fortis M.D.   On: 02/06/2017 17:22    Recent Labs  02/08/17 0540  WBC 4.0  HGB 10.7*  HCT 33.0*  PLT 312    Recent Labs  02/08/17 0540  NA 138  K 3.6  CL 104  GLUCOSE 132*  BUN 12  CREATININE 0.45*  CALCIUM 8.7*   CBG (last 3)   Recent Labs  02/08/17 0008 02/08/17 0424 02/08/17 0755  GLUCAP 180* 130* 130*    Wt Readings from Last 3 Encounters:  02/08/17 60.1 kg (132 lb 7.9 oz)  02/01/17 65.6 kg (144 lb 9.6 oz)  07/31/16 68 kg (150 lb)    Physical Exam:    Gen: NAD. Well developed.  HENT: Craniotomy site clean and dry. +NGT in place Cardiovascular: RRR. Respiratory:  CTA B GI: Soft. Bowel sounds are normal. Neurological: He is alert.  Patient is distracted. Follows simple commands Moves all 4's.   Psychiatric:   restless.  Skin. Warm and dry. Intact.   Assessment/Plan: 1. Cognitive and functional deficits secondary to right traumatic ICH/SDH s/p craniectomy which require 3+ hours per day of interdisciplinary  therapy in a comprehensive inpatient rehab setting. Physiatrist is providing close team supervision and 24 hour management of active medical problems listed below. Physiatrist and rehab team continue to assess barriers to discharge/monitor patient progress toward functional and medical goals.  Function:  Bathing Bathing position   Position: Wheelchair/chair at sink  Bathing parts Body parts bathed by patient: Right upper leg, Left upper leg, Abdomen, Chest, Left arm Body parts bathed by helper: Right arm, Front perineal area, Buttocks, Right lower leg, Back, Left lower leg  Bathing assist Assist Level: 2 helpers      Upper Body Dressing/Undressing Upper body dressing   What is the patient wearing?: Hospital gown                Upper body assist Assist Level: Touching or steadying assistance(Pt > 75%)      Lower Body Dressing/Undressing Lower body dressing   What is the patient wearing?: Pants, Non-skid slipper socks, Ted Hose       Pants- Performed by helper: Thread/unthread right pants leg, Thread/unthread left pants leg, Pull pants up/down   Non-skid slipper socks- Performed by helper: Don/doff right sock, Don/doff left sock               TED Hose - Performed by helper: Don/doff right TED hose, Don/doff left TED hose  Lower body assist Assist for lower body dressing: 2 Helpers      Toileting Toileting Toileting activity did not occur: No  continent bowel/bladder event (hospital gown)   Toileting steps completed by helper: Adjust clothing prior to toileting, Performs perineal hygiene, Adjust clothing after toileting    Toileting assist Assist level:  (total)   Transfers Chair/bed transfer   Chair/bed transfer method: Squat pivot Chair/bed transfer assist level: 2 helpers Chair/bed transfer assistive device: Armrests     Locomotion Ambulation Ambulation activity did not occur: Safety/medical Investment banker, operational activity did not  occur: Safety/medical concerns     Assist Level: Dependent (Pt equals 0%) (TIS)  Cognition Comprehension Comprehension assist level: Understands basic less than 25% of the time/ requires cueing >75% of the time  Expression Expression assist level: Expresses basis less than 25% of the time/requires cueing >75% of the time.  Social Interaction Social Interaction assist level: Interacts appropriately less than 25% of the time. May be withdrawn or combative.  Problem Solving Problem solving assist level: Solves basic less than 25% of the time - needs direction nearly all the time or does not effectively solve problems and may need a restraint for safety  Memory Memory assist level: Recognizes or recalls less than 25% of the time/requires cueing greater than 75% of the time  Medical Problem List and Plan: 1. 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 DVT Prophylaxis/Anticoagulation: Subcutaneous heparin. Monitor platelet counts and any signs of bleeding. 3. Pain Management: Hycet  4. Mood: Ativan as needed 5. Neuropsych: This patient is notcapable of making decisions on hisown behalf.  -sleep chart--improved patterns overall  -seroquel for sleep/agitation  -continue seroquel during the day for agitation   -consider b-blocker if needed  -limit benzos  -continue ritalin for arousal and initiation  -team has to be aware that actions/words willl be impulsive and behavior will be intermittently agitated   -soft restraints 6. Skin/Wound Care: Routine skin checks  -helmet for cranium protection  -flap site  intact 7. Fluids/Electrolytes/Nutrition: NGT for nutrition 8.Seizure prophylaxis. Keppra 750 mg twice daily 9.Pneumonia. Five-day course of Ancef  completed  01/22/2017 10.Dysphagia. CorTrak nasogastric tube for a traditional support.   -continue NPO per SLP  -NGT functioning 11.Urinary retention/Escherichia coli UTI. Held Urecholine.Completed course of Cipro.   -condom cath 12.Diabetes mellitus with peripheral neuropathy. Hemoglobin A1c 10.4. Lantus insulin 15 units twice a day. Patient on Glucophage 500 mg twice a day and Glucotrol 5 mg daily   Relatively controlled 4/8 13.History of hypertension. Patient on lisinopril 2.5 mg daily prior to admission. Resume as needed  Relatively controlled 4/8 14.Constipation. Laxative assistance  LOS (Days) 7 A FACE TO FACE EVALUATION WAS PERFORMED  [name], MD 02/08/2017 9:02 AM

## 2017-02-08 NOTE — Progress Notes (Signed)
Occupational Therapy Session Note  Patient Details  Name: [name] MRN: [id number] Date of Birth: 21-Nov-1965  Today's Date: 02/08/2017 OT Individual Time: 1100-1158 OT Individual Time Calculation (min): 58 min  and Today's Date: 02/08/2017 OT Co-Treatment Time: 8295-6213 with Aleda Grana, DPT ( 1300-1330) OT Co-Treatment Time Calculation (min): 15 min   Short Term Goals: Week 1:  OT Short Term Goal 1 (Week 1): Pt will sustain attention to self-care task for 2 mins OT Short Term Goal 2 (Week 1): Pt will initiate self-care task with mod cues OT Short Term Goal 3 (Week 1): Pt will complete bathing with max assist OT Short Term Goal 4 (Week 1): Pt will maintain sitting balance with min assist for 5 mins to engage in self-care task OT Short Term Goal 5 (Week 1): Pt will complete toilet transfer with max assist of 1 caregiver  Skilled Therapeutic Interventions/Progress Updates:    Session 1: Upon entering the room, pt seated in wheelchair with family and interpreter present in the room. Skilled OT session with focus on initiation and sequence of routine self care tasks. Pt seated in wheelchair and engaged in bathing task at sink by washing 5/8 parts with max multimodal cues for initiation, increased time, and demonstrations. Pt able to tolerate clothing this session and motivated to dress. Total A to stand with second helper pulling pants over B hips. Pt becoming increasingly more restless as session continues. OT providing education to caregiver, sister, regarding pt's need for quiet environment. Squat pivot transfer from wheelchair >bed with mod A for sit >supine. B hand mittens applied and wrist restraints donned. Bed alarm activated and caregiver exiting the room with therapist to allow him to rest.   Co tx with PT - Session 2: Pt seen for skilled co-treatment with PT. Pt required total A for supine >sit and sitting on EOB for 10 minutes with min - total A for sitting balance. Pt  tossing bean bags after demonstration given by therapist with increase time and mod cues to initiate. Pt unable to coordinate tossing with L UE with R hand restrained. Pt able to name various foods listed on bean bag as well as how he usually eats them with max verbal questioning cues. Pt transferred from bed >wheelchair with total A +2 for safety with squat pivot transfer to the R. Please see PT note for further details regarding session.   Therapy Documentation Precautions:  Precautions Precautions: Fall Precaution Comments: NPO (NG tube), BUE mittens, bone flap removed R side of head Restrictions Weight Bearing Restrictions: No  See Function Navigator for Current Functional Status.   Therapy/Group: Individual Therapy  [name] 02/08/2017, 9:34 PM

## 2017-02-09 ENCOUNTER — Inpatient Hospital Stay (HOSPITAL_COMMUNITY): Payer: Self-pay | Admitting: Physical Therapy

## 2017-02-09 ENCOUNTER — Inpatient Hospital Stay (HOSPITAL_COMMUNITY): Payer: Self-pay

## 2017-02-09 ENCOUNTER — Inpatient Hospital Stay (HOSPITAL_COMMUNITY): Payer: Self-pay | Admitting: Occupational Therapy

## 2017-02-09 ENCOUNTER — Inpatient Hospital Stay (HOSPITAL_COMMUNITY): Payer: Self-pay | Admitting: Speech Pathology

## 2017-02-09 ENCOUNTER — Encounter (HOSPITAL_COMMUNITY): Payer: Self-pay | Admitting: Occupational Therapy

## 2017-02-09 LAB — GLUCOSE, CAPILLARY
GLUCOSE-CAPILLARY: 115 mg/dL — AB (ref 65–99)
GLUCOSE-CAPILLARY: 142 mg/dL — AB (ref 65–99)
GLUCOSE-CAPILLARY: 172 mg/dL — AB (ref 65–99)
GLUCOSE-CAPILLARY: 192 mg/dL — AB (ref 65–99)
GLUCOSE-CAPILLARY: 95 mg/dL (ref 65–99)
Glucose-Capillary: 107 mg/dL — ABNORMAL HIGH (ref 65–99)

## 2017-02-09 MED ORDER — SENNA 8.6 MG PO TABS
1.0000 | ORAL_TABLET | Freq: Every day | ORAL | Status: DC
  Administered 2017-02-11 – 2017-03-18 (×33): 8.6 mg via ORAL
  Filled 2017-02-09 (×35): qty 1

## 2017-02-09 MED ORDER — SODIUM BICARBONATE 650 MG PO TABS
650.0000 mg | ORAL_TABLET | Freq: Once | ORAL | Status: AC
  Administered 2017-02-09: 650 mg via ORAL
  Filled 2017-02-09: qty 1

## 2017-02-09 MED ORDER — ACETAMINOPHEN 160 MG/5ML PO SOLN
650.0000 mg | Freq: Four times a day (QID) | ORAL | Status: DC | PRN
  Administered 2017-02-14 – 2017-03-17 (×5): 650 mg via ORAL
  Filled 2017-02-09 (×5): qty 20.3

## 2017-02-09 MED ORDER — INSULIN ASPART 100 UNIT/ML ~~LOC~~ SOLN
0.0000 [IU] | Freq: Three times a day (TID) | SUBCUTANEOUS | Status: DC
  Administered 2017-02-10: 2 [IU] via SUBCUTANEOUS
  Administered 2017-02-10 (×3): 3 [IU] via SUBCUTANEOUS
  Administered 2017-02-11: 2 [IU] via SUBCUTANEOUS
  Administered 2017-02-11: 3 [IU] via SUBCUTANEOUS
  Administered 2017-02-11: 2 [IU] via SUBCUTANEOUS
  Administered 2017-02-12: 3 [IU] via SUBCUTANEOUS
  Administered 2017-02-12 – 2017-02-15 (×6): 2 [IU] via SUBCUTANEOUS
  Administered 2017-02-15 (×2): 3 [IU] via SUBCUTANEOUS
  Administered 2017-02-16: 2 [IU] via SUBCUTANEOUS
  Administered 2017-02-16: 3 [IU] via SUBCUTANEOUS
  Administered 2017-02-17 – 2017-02-18 (×3): 2 [IU] via SUBCUTANEOUS
  Administered 2017-02-18 – 2017-02-19 (×2): 3 [IU] via SUBCUTANEOUS
  Administered 2017-02-19 – 2017-02-20 (×2): 2 [IU] via SUBCUTANEOUS
  Administered 2017-02-20: 5 [IU] via SUBCUTANEOUS
  Administered 2017-02-21 (×3): 2 [IU] via SUBCUTANEOUS
  Administered 2017-02-22 (×2): 3 [IU] via SUBCUTANEOUS
  Administered 2017-02-22 – 2017-02-23 (×2): 2 [IU] via SUBCUTANEOUS
  Administered 2017-02-23: 3 [IU] via SUBCUTANEOUS
  Administered 2017-02-23 – 2017-02-25 (×6): 2 [IU] via SUBCUTANEOUS
  Administered 2017-02-25 (×2): 3 [IU] via SUBCUTANEOUS
  Administered 2017-02-26: 2 [IU] via SUBCUTANEOUS
  Administered 2017-02-26: 3 [IU] via SUBCUTANEOUS
  Administered 2017-02-27: 2 [IU] via SUBCUTANEOUS
  Administered 2017-02-27 – 2017-02-28 (×4): 3 [IU] via SUBCUTANEOUS
  Administered 2017-03-01 (×2): 2 [IU] via SUBCUTANEOUS
  Administered 2017-03-01 (×2): 3 [IU] via SUBCUTANEOUS
  Administered 2017-03-02: 2 [IU] via SUBCUTANEOUS
  Administered 2017-03-02: 3 [IU] via SUBCUTANEOUS
  Administered 2017-03-03 – 2017-03-09 (×15): 2 [IU] via SUBCUTANEOUS
  Administered 2017-03-10: 3 [IU] via SUBCUTANEOUS
  Administered 2017-03-11 (×2): 2 [IU] via SUBCUTANEOUS
  Administered 2017-03-11: 3 [IU] via SUBCUTANEOUS
  Administered 2017-03-12 – 2017-03-17 (×10): 2 [IU] via SUBCUTANEOUS
  Administered 2017-03-18: 3 [IU] via SUBCUTANEOUS

## 2017-02-09 MED ORDER — LEVETIRACETAM 100 MG/ML PO SOLN
750.0000 mg | Freq: Two times a day (BID) | ORAL | Status: DC
  Administered 2017-02-09 – 2017-02-17 (×16): 750 mg via ORAL
  Filled 2017-02-09 (×16): qty 10

## 2017-02-09 MED ORDER — QUETIAPINE FUMARATE 25 MG PO TABS
25.0000 mg | ORAL_TABLET | Freq: Two times a day (BID) | ORAL | Status: DC
  Administered 2017-02-10 – 2017-02-17 (×15): 25 mg via ORAL
  Filled 2017-02-09 (×15): qty 1

## 2017-02-09 MED ORDER — METHYLPHENIDATE HCL 5 MG PO TABS
5.0000 mg | ORAL_TABLET | Freq: Two times a day (BID) | ORAL | Status: DC
  Administered 2017-02-10 – 2017-02-11 (×3): 5 mg via ORAL
  Filled 2017-02-09 (×3): qty 1

## 2017-02-09 MED ORDER — DOCUSATE SODIUM 50 MG/5ML PO LIQD
100.0000 mg | Freq: Every day | ORAL | Status: DC
  Administered 2017-02-11 – 2017-03-18 (×34): 100 mg via ORAL
  Filled 2017-02-09 (×36): qty 10

## 2017-02-09 MED ORDER — PANCRELIPASE (LIP-PROT-AMYL) 12000-38000 UNITS PO CPEP
24000.0000 [IU] | ORAL_CAPSULE | Freq: Once | ORAL | Status: AC
  Administered 2017-02-09: 24000 [IU] via ORAL
  Filled 2017-02-09: qty 2

## 2017-02-09 MED ORDER — INSULIN GLARGINE 100 UNIT/ML ~~LOC~~ SOLN
15.0000 [IU] | Freq: Every day | SUBCUTANEOUS | Status: DC
  Administered 2017-02-10 – 2017-03-17 (×34): 15 [IU] via SUBCUTANEOUS
  Filled 2017-02-09 (×36): qty 0.15

## 2017-02-09 MED ORDER — GLUCERNA SHAKE PO LIQD
237.0000 mL | Freq: Three times a day (TID) | ORAL | Status: DC
  Administered 2017-02-09 – 2017-02-12 (×8): 237 mL via ORAL
  Administered 2017-02-12: 1 via ORAL
  Administered 2017-02-12 – 2017-03-18 (×94): 237 mL via ORAL

## 2017-02-09 MED ORDER — QUETIAPINE FUMARATE 50 MG PO TABS
50.0000 mg | ORAL_TABLET | Freq: Every day | ORAL | Status: DC
  Administered 2017-02-09 – 2017-03-02 (×22): 50 mg via ORAL
  Filled 2017-02-09 (×22): qty 1

## 2017-02-09 MED ORDER — HYDROCODONE-ACETAMINOPHEN 7.5-325 MG/15ML PO SOLN
5.0000 mL | ORAL | Status: DC | PRN
  Administered 2017-02-09 – 2017-02-24 (×4): 5 mL via ORAL
  Filled 2017-02-09 (×4): qty 15

## 2017-02-09 NOTE — Progress Notes (Signed)
Physical Therapy Session Note  Patient Details  Name: [name] MRN: [id number] Date of Birth: October 28, 1966  Today's Date: 02/09/2017 PT Individual Time: 803-903 and 8295-6213 PT Individual Time Calculation (min): 60 min and 45 min   Short Term Goals: Week 2:  PT Short Term Goal 1 (Week 2): Pt will consistently participate in supine<>sitting with max assist +1. PT Short Term Goal 2 (Week 2): Pt will stand with max assist +1. PT Short Term Goal 3 (Week 2): Pt will consistently participate in bed<>w/c transfers with max assist +1.   Skilled Therapeutic Interventions/Progress Updates:  Treatment 1: Pt received asleep in bed but easily awakened. Pt required max/total assist for donning pants & shirt as pt did not initiate putting on clothing. Pt transferred bed<>w/c via squat pivot with +2 assist. Pt able to recall he lost a portion of time, with therapist educating him on situation & current location. Remainder of session focused on ambulation with use of lite gait for BLE neuro re-ed and weight bearing through BLE. In hallway pt ambulated 30 ft + 20 ft with +2 assist and use of lite gait. Pt demonstrates very little active hip/knee extension to come to full upright standing and instead relies on lite gait sling to support weight. Pt requires max tactile cuing & manual facilitation to engage glutes, quads & hamstrings. Pt able to initiate and advance stepping with BLE with max multimodal cuing and blocking of contralateral LE. Pt demonstrates ataxic gait (LLE>RLE) and tends to push posteriorly with hands on lite gait handles. Pt appears anxious to use BLE to support him in standing. Attempted to transfer sit>stand with rail in hallway to remove lite gait sling but pt did not initiate transfer despite max multimodal cuing and instead required +2 assist to lift buttocks off w/c seat; no muscle activation in BLE noted. At end of session pt returned to bed & left in bed with all needs within  reach, BUE mittens & restraints donned, and in care of OT.  Treatment 2: Pt received in bed and observed to be incontinent of bowel. Pt performed rolling L<>R with bed rails and max assist to initiate & participate in movement to allow therapist to perform peri-hygiene total assist. Therapist then assisted pt with threading pants on BLE total assist and pt initiated pulling them up to hips but ultimately required assist to pull them over hips. Pt did not initiate supine>sit despite max encouragement, cuing, and time, ultimately requiring +2 assist to complete. Pt requires +2 assist to transfer bed<>w/c and w/c<>mat table via squat pivot 2/2 pt's inability to initiate nor participate in movement. Pt set up at sink & initiated washing hands, requiring assistance & cuing to retrieve soap & paper towels. Transported pt to gym via w/c total assist and transferred onto mat table. Pt engaged in kicking soccer ball with max assist for sitting balance. Pt demonstrates inattention to LLE and impaired coordination of BLE. Pt beginning to pull forward on EOM as task progressed, with pt reporting fatigue & requesting to lie down. Pt returned to w/c and engaged in ball toss to focus on BUE coordination. Pt with difficulty holding ball between BUE despite demo & cuing. Pt also demonstrates LUE inattention and mainly uses RUE to catch/toss ball. Pt continued to report fatigue, therefore pt returned to bed in room. Pt left in bed with BUE mittens & restraints donned, bed alarm set, all needs within reach, & sister present to supervise. Pt missed 15 minutes of skilled PT  treatment 2/2 fatigue.   Therapy Documentation Precautions:  Precautions Precautions: Fall Precaution Comments: NPO (NG tube), BUE mittens, bone flap removed R side of head Restrictions Weight Bearing Restrictions: No   General: PT Amount of Missed Time (min): 15 Minutes PT Missed Treatment Reason: Patient fatigue   Pain: Faces - no pain.   See  Function Navigator for Current Functional Status.   Therapy/Group: Individual Therapy  Sandi Mariscal 02/09/2017, 5:46 PM

## 2017-02-09 NOTE — Progress Notes (Signed)
[facility]     PROGRESS NOTE    Subjective/Complaints: No new issues. Awake in bed. Slept fair last night. Told me he wanted to go to the bathroom  ROS: pt denies nausea, vomiting, diarrhea, cough, shortness of breath or chest pain  Objective: Vital Signs: Blood pressure 110/78, pulse 100, temperature 97.1 F (36.2 C), temperature source Axillary, resp. rate 16, height  (1.575 m), weight 58.9 kg (129 lb 13.6 oz), SpO2 99 %. No results found.  Recent Labs  02/08/17 0540  WBC 4.0  HGB 10.7*  HCT 33.0*  PLT 312    Recent Labs  02/08/17 0540  NA 138  K 3.6  CL 104  GLUCOSE 132*  BUN 12  CREATININE 0.45*  CALCIUM 8.7*   CBG (last 3)   Recent Labs  02/08/17 2359 02/09/17 0359 02/09/17 0812  GLUCAP 115* 172* 142*    Wt Readings from Last 3 Encounters:  02/09/17 58.9 kg (129 lb 13.6 oz)  02/01/17 65.6 kg (144 lb 9.6 oz)  07/31/16 68 kg (150 lb)    Physical Exam:    Gen: NAD. Well developed.  HENT: Craniotomy site clean and dry. +NGT in place Cardiovascular: RRR Respiratory:  CTA B GI: Soft. Bowel sounds are normal. Neurological: He is alert.  Patient is distracted. Follows simple commands Moves all 4's.   Psychiatric:   Restless but not agitated Skin. Warm and dry. Intact.   Assessment/Plan: 1. Cognitive and functional deficits secondary to right traumatic ICH/SDH s/p craniectomy which require 3+ hours per day of interdisciplinary therapy in a comprehensive inpatient rehab setting. Physiatrist is providing close team supervision and 24 hour management of active medical problems listed below. Physiatrist and rehab team continue to assess barriers to discharge/monitor patient progress toward functional and medical goals.  Function:  Bathing Bathing position   Position: Wheelchair/chair at sink  Bathing parts Body parts bathed by patient: Right upper leg, Left upper leg, Abdomen, Chest, Left arm Body parts  bathed by helper: Right arm, Right lower leg, Left lower leg, Back  Bathing assist Assist Level: 2 helpers      Upper Body Dressing/Undressing Upper body dressing   What is the patient wearing?: Pull over shirt/dress       Pull over shirt/dress - Perfomed by helper: Thread/unthread right sleeve, Thread/unthread left sleeve, Put head through opening, Pull shirt over trunk        Upper body assist Assist Level:  (total A)      Lower Body Dressing/Undressing Lower body dressing   What is the patient wearing?: Pants       Pants- Performed by helper: Thread/unthread right pants leg, Thread/unthread left pants leg, Pull pants up/down   Non-skid slipper socks- Performed by helper: Don/doff right sock, Don/doff left sock               TED Hose - Performed by helper: Don/doff right TED hose, Don/doff left TED hose  Lower body assist Assist for lower body dressing: 2 Helpers      Toileting Toileting Toileting activity did not occur: No continent bowel/bladder event (hospital gown)   Toileting steps completed by helper: Adjust clothing prior to toileting, Performs perineal hygiene, Adjust clothing after toileting    Toileting assist Assist level:  (total)   Transfers Chair/bed transfer   Chair/bed transfer method: Squat pivot Chair/bed transfer assist level: 2 helpers Chair/bed transfer assistive device: Armrests     Locomotion Ambulation Ambulation activity did not occur: Safety/medical  concerns         Wheelchair Wheelchair activity did not occur: Safety/medical concerns     Assist Level: Dependent (Pt equals 0%) (TIS)  Cognition Comprehension Comprehension assist level: Understands basic less than 25% of the time/ requires cueing >75% of the time  Expression Expression assist level: Expresses basis less than 25% of the time/requires cueing >75% of the time.  Social Interaction Social Interaction assist level: Interacts appropriately less than 25% of the time.  May be withdrawn or combative.  Problem Solving Problem solving assist level: Solves basic less than 25% of the time - needs direction nearly all the time or does not effectively solve problems and may need a restraint for safety  Memory Memory assist level: Recognizes or recalls less than 25% of the time/requires cueing greater than 75% of the time  Medical Problem List and Plan: 1. Decreased functional mobility with cognitive deficitssecondary to right IPH/SDH/temporal bone fracture after fall S/P right frontal temporoparietal craniectomy 01/14/2017 with placement of bone flap in the abdomen  Cont CIR  Cont restraints  -RLAS IV with expected behaviors 2. DVT Prophylaxis/Anticoagulation: Subcutaneous heparin. Monitor platelet counts and any signs of bleeding. 3. Pain Management: Hycet  4. Mood: Ativan as needed 5. Neuropsych: This patient is notcapable of making decisions on hisown behalf.  -sleep chart--improved patterns overall  -seroquel for sleep/agitation  -continue seroquel during the day for agitation   -limit benzos  -continue ritalin for arousal and initiation  -team has to be aware that actions/words willl be impulsive and behavior will be intermittently agitated   -soft restraints for safety 6. Skin/Wound Care: Routine skin checks  -helmet for cranium protection  -flap site  intact 7. Fluids/Electrolytes/Nutrition: NGT for nutrition 8.Seizure prophylaxis. Keppra 750 mg twice daily 9.Pneumonia. Five-day course of Ancef  completed 01/22/2017 10.Dysphagia. CorTrak nasogastric tube for a traditional support.   -continue NPO per SLP---MBS today  -distraction/cognition biggest factors 11.Urinary retention/Escherichia coli UTI. Held Urecholine.Completed course of Cipro.   -condom cath 12.Diabetes mellitus with peripheral neuropathy. Hemoglobin A1c 10.4. Lantus insulin 15 units twice a day. Patient on Glucophage 500 mg twice a day and Glucotrol 5 mg daily   Relatively  controlled 4/8 13.History of hypertension. Patient on lisinopril 2.5 mg daily prior to admission. Resume as needed  Relatively controlled 4/8 14.Constipation. Laxative assistance  LOS (Days) 8 A FACE TO FACE EVALUATION WAS PERFORMED  [name], MD 02/09/2017 8:41 AM

## 2017-02-09 NOTE — Patient Care Conference (Signed)
Inpatient RehabilitationTeam Conference and Plan of Care Update Date: 02/09/2017   Time: 2:35 PM    Patient Name: [name]      Medical Record Number: [id number]  Date of Birth: 1966-06-29 Sex: Male         Room/Bed: 4W12C/4W12C-01 Payor Info: Payor: MEDICAID POTENTIAL / Plan: MEDICAID POTENTIAL / Product Type: *No Product type* /    Admitting Diagnosis: trauma fall  R IPH  SDH  Crani  Admit Date/Time:  02/01/2017  4:07 PM Admission Comments: No comment available   Primary Diagnosis:  Traumatic intraparenchymal hemorrhage (HCC) Principal Problem: Traumatic intraparenchymal hemorrhage [facility])  Patient Active Problem List   Diagnosis Date Noted  . Essential hypertension   . Agitated   . Lethargy   . Benign essential HTN   . Poorly controlled type 2 diabetes mellitus with peripheral neuropathy (HCC)   . Traumatic intraparenchymal hemorrhage (HCC) 02/01/2017  . Aphasia 02/01/2017  . Dysphagia, oropharyngeal 02/01/2017  . Intracerebral hemorrhage (HCC) 01/14/2017  . Fall 01/10/2017  . Subdural hemorrhage (HCC) 01/10/2017  . Diabetes type 2, uncontrolled (HCC) 06/13/2015    Expected Discharge Date: Expected Discharge Date:  (4 weeks)  Team Members Present: Physician leading conference: Dr. Ferman Hamming, PsyD Social Worker Present: Amada Jupiter, LCSW Nurse Present: Carmie End, RN PT Present: Aleda Grana, PT OT Present: Callie Fielding, OT SLP Present: Feliberto Gottron, SLP PPS Coordinator present : Tora Duck, RN, CRRN     Current Status/Progress Goal Weekly Team Focus  Medical   emerging through stages of TBI recovery. still with NGT, requiring restraints.   continue to improve focus/attention, control agitation  sleep, nutrition, behavioral mgt   Bowel/Bladder   incontinent most of the time , condom cath at night time  continent of b/b  toileting patient more often   Swallow/Nutrition/ Hydration   MBS today. Dys. 1 textures with thin liquids,  Full Supervision  Min A with least restrictive diet  tolerance of current diet, increased use of swallow strategies    ADL's   +2 assist for BADLs w/c level at sink, initiates at most washing 3/10 body parts when bathing, can follow 1 step commands 75% of B/D  Min-Mod A  ADL retraining, sitting/standing balance, attention, initiation, following instruction   Mobility   +2 total assist, poor initiation & awareness  min assist overall  bed mobility, transfers, cognitive remediaiton, activity tolerance, sitting balance, strengthening   Communication   Max-Total A  Min A  ability to express basic wants/needs, following commands    Safety/Cognition/ Behavioral Observations  Max-Total A  Min A  orientation, initiation, sustained attention    Pain   no complains of pain  less<2  monitor and adress it as need it   Skin   head incision is OTA  no breakdown  monitor and assess it every shift    Rehab Goals Patient on target to meet rehab goals: Yes *See Care Plan and progress notes for long and short-term goals.  Barriers to Discharge: severity of injury    Possible Resolutions to Barriers:  continue cognitive/behavioral mgt, family ed, normalize day/night patterns    Discharge Planning/Teaching Needs:  Plan for pt to d/c home with sister and her family.  Sister to be primary caregiver and aware need for 24/7 support.  Teaching to be ongoing.   Team Discussion:  Swallow study today - slight delay but will start D1, thin diet.  Plan to remove NG and move into a VAIL bed (and d/c  telesitter).  Beginning to indicate need to toilet.  Perceptual issues and ataxia with movements.  Do not yet feel ready to set targeted d/c date - stay at 4 weeks ELOS.  Revisions to Treatment Plan:  Beginning diet;  Plan to change to VAIL bed      Shaguana Love 02/09/2017, 6:31 PM

## 2017-02-09 NOTE — Progress Notes (Signed)
Nutrition Follow-up  DOCUMENTATION CODES:   Not applicable  INTERVENTION:  Provide Glucerna Shake po TID, each supplement provides 220 kcal and 10 grams of protein.  Encourage adequate PO intake.   NUTRITION DIAGNOSIS:   Increased nutrient needs related to acute illness as evidenced by estimated needs; ongoing  GOAL:   Patient will meet greater than or equal to 90% of their needs; progressing  MONITOR:   TF tolerance, Weight trends, Labs, I & O's, Skin  REASON FOR ASSESSMENT:   Consult Enteral/tube feeding initiation and management  ASSESSMENT:   [name] with history of hypertension, diabetes mellitus. Presented 01/10/2017 after being found down from a supposed fall. CT of the head and imaging revealed intracranial hemorrhage with a 4.5 cm right frontal intraparenchymal hemorrhage, 6 mm right subdural hematoma and other scattered areas of intraparenchymal and subarachnoid hemorrhage with right to left midline shift. Nondisplaced vertical fracture through the left temporal bone.  Underwent right frontal temporal parietal craniectomy for hematoma evacuation 01/14/2017 per Dr. Mikal Berg with placement of bone flap in the abdomen.  Nasogastric tube in place for nutritional support patient remains NPO  MBS done this AM. Pt with mild aspiration risk. Diet has been advanced to a dysphagia 1 diet with thin liquids. Tube feeds have been discontinued. RD to provide Glucerna Shake to aid in adequate caloric and protein needs.   Labs and medications reviewed.   Diet Order:  DIET - DYS 1 Room service appropriate? Yes; Fluid consistency: Thin  Skin:   (Incision on head R)  Last BM:  4/9  Height:   Ht Readings from Last 1 Encounters:  02/01/17  (1.575 m)    Weight:   Wt Readings from Last 1 Encounters:  02/09/17 129 lb 13.6 oz (58.9 kg)    Ideal Body Weight:  53.6 kg  BMI:  Body mass index is 23.75 kg/m.  Estimated Nutritional  Needs:   Kcal:  1800-2000  Protein:  100-115 grams  Fluid:  >/= 1.8 L/day  EDUCATION NEEDS:   No education needs identified at this time  Jamie Smiling, MS, RD, LDN Pager # [phone] After hours/ weekend pager # [phone]

## 2017-02-09 NOTE — Progress Notes (Signed)
Speech Language Pathology Note  Patient Details  Name: [name] MRN: [id number] Date of Birth: 1965/11/24 Today's Date: 02/09/2017  MBSS complete. Full report located under chart review in imaging section.    Kathalina Ostermann 02/09/2017, 3:15 PM

## 2017-02-09 NOTE — Progress Notes (Signed)
Speech Language Pathology Weekly Progress and Session Note  Patient Details  Name: [name] MRN: [id number] Date of Birth: [birth year]  Beginning of progress report period: February 02, 2017 End of progress report period: February 09, 2017  Today's Date: 02/09/2017 SLP Individual Time: 1300-1330 SLP Individual Time Calculation (min): 30 min  Short Term Goals: Week 1: SLP Short Term Goal 1 (Week 1): Patient will initiate a functional task with Max A multimodal cues. SLP Short Term Goal 1 - Progress (Week 1): Met SLP Short Term Goal 2 (Week 1): Patient will follow 1 step commands in 25% of opportunities with Max A mutlimodal cues.  SLP Short Term Goal 2 - Progress (Week 1): Met SLP Short Term Goal 3 (Week 1): Patient will verbally respond to questions in 25% of opportunities with Max A multimodal cues.  SLP Short Term Goal 3 - Progress (Week 1): Met SLP Short Term Goal 4 (Week 1): Patient will consume trials of thin liquids with minimal overt s/s of aspiration in 25% of trials with Max A multimodal cues to assess readiness for MBS.  SLP Short Term Goal 4 - Progress (Week 1): Met SLP Short Term Goal 5 (Week 1): Patient will consume trials of Dys. 1 textures with minimal overt s/s of aspiration in 25% of trials with Max A multimodal cues.  SLP Short Term Goal 5 - Progress (Week 1): Met    New Short Term Goals: Week 2: SLP Short Term Goal 1 (Week 2): Patient will consume current diet with minimal overt s/s of aspiration with Mod A verbal cues for use of swallowing compensatory strategies.  SLP Short Term Goal 2 (Week 2): Patient will demonstrate sustained attention to a functional task for ~2 minutes with Mod A verbal cues for redirection.  SLP Short Term Goal 3 (Week 2): Patient will orient to place, situation and time with Mod A multimodal cues.  SLP Short Term Goal 4 (Week 2): Patient will verbally respond to questions in 50% of opportunities with Max A multimodal cues.  SLP Short  Term Goal 5 (Week 2): Patient will demonstrate basic problem solving for basic tasks with Max A verbal cues.   Weekly Progress Updates: Patient has made excellent gains and has met 5 of 4 STG's this reporting period. Currently, patient demonstrates behaviors consistent with a Rancho Level IV and requires overall total A to complete functional and familiar tasks safely. However, patient demonstrates increased ability to follow commands, initiate functional tasks and ability to express basic wants/needs but can be limited by lethargy and restlessness at times. Patient had a MBS today and initiated a diet of Dys. 1 textures with thin liquids with full supervision. Patient and family education is ongoing. Patient would benefit from continued skilled SLP intervention to maximize his cognitive and swallowing function and overall functional independence prior to discharge.      Intensity: Minumum of 1-2 x/day, 30 to 90 minutes Frequency: 3 to 5 out of 7 days Duration/Length of Stay: 4 weeks  Treatment/Interventions: English as a second language teacher;Dysphagia/aspiration precaution training;Internal/external aids;Environmental controls;Therapeutic Activities;Patient/family education;Functional tasks;Cognitive remediation/compensation   Daily Session  Skilled Therapeutic Interventions: Skilled treatment session focused on dysphagia goals. SLP facilitated session by providing Min A verbal and tactile cues for a slow rate of self-feeding and intermittent swallow initiation with snack of Dys. 1 textures with thin liquids. Patient consumed snack without overt s/s of aspiration. Patient demonstrated sustained attention to self-feeding for ~2 minutes with Mod A verbal cues for redirection. Patient left upright in  bed with all needs within reach and bilateral hand and wrist restraints in place. Continue with current plan of care.      Function:   Eating Eating   Modified Consistency Diet: Yes Eating Assist Level: More than  reasonable amount of time;Supervision or verbal cues;Set up assist for   Eating Set Up Assist For: Opening containers       Cognition Comprehension Comprehension assist level: Understands basic less than 25% of the time/ requires cueing >75% of the time  Expression   Expression assist level: Expresses basis less than 25% of the time/requires cueing >75% of the time.  Social Interaction Social Interaction assist level: Interacts appropriately less than 25% of the time. May be withdrawn or combative.  Problem Solving Problem solving assist level: Solves basic less than 25% of the time - needs direction nearly all the time or does not effectively solve problems and may need a restraint for safety  Memory Memory assist level: Recognizes or recalls less than 25% of the time/requires cueing greater than 75% of the time   Pain No/Denies Pain   Therapy/Group: Individual Therapy  Tylen Leverich 02/09/2017, 3:20 PM

## 2017-02-09 NOTE — Progress Notes (Signed)
Occupational Therapy Weekly Progress Note  Patient Details  Name: [name] MRN: [id number] Date of Birth: Aug 08, 1966  Beginning of progress report period: February 02, 2017 End of progress report period: February 09, 2017  Today's Date: 02/09/2017 OT Individual Time: 1100-1200 OT Individual Time Calculation (min): 60 min    Patient has met 1 of 5 short term goals. Pt is making slow progress towards occupational therapy goals this week. Pt continues to require +2 assistance for functional transfers, LB clothing management, and toileting. Pt has had increased restlessness as well this week but continues to participate. Pt has demonstrated initiation of routine self care tasks with max multimodal cues. Pt inconsistently follows 1 step verbal commands. Pt is verbalizing toileting needs so that care can be given. Pt continues to benefit from OT intervention.   Patient continues to demonstrate the following deficits: muscle weakness, decreased cardiorespiratoy endurance, decreased coordination and decreased motor planning, decreased awareness, decreased initiation, decreased attention, decreased problem solving, decreased safety awareness and delayed processing and decreased sitting balance, decreased standing balance, decreased postural control and decreased balance strategies and therefore will continue to benefit from skilled OT intervention to enhance overall performance with BADL.  Patient progressing toward long term goals..  Continue plan of care.  OT Short Term Goals Week 1:  OT Short Term Goal 1 (Week 1): Pt will sustain attention to self-care task for 2 mins OT Short Term Goal 1 - Progress (Week 1): Progressing toward goal OT Short Term Goal 2 (Week 1): Pt will initiate self-care task with mod cues OT Short Term Goal 2 - Progress (Week 1): Progressing toward goal OT Short Term Goal 3 (Week 1): Pt will complete bathing with max assist OT Short Term Goal 3 - Progress (Week 1):  Met OT Short Term Goal 4 (Week 1): Pt will maintain sitting balance with min assist for 5 mins to engage in self-care task OT Short Term Goal 4 - Progress (Week 1): Not met OT Short Term Goal 5 (Week 1): Pt will complete toilet transfer with max assist of 1 caregiver OT Short Term Goal 5 - Progress (Week 1): Not met Week 2:  OT Short Term Goal 1 (Week 2): Pt will perform toilet transfer with max A of 1 in order to decrease level of assistance needed. OT Short Term Goal 2 (Week 2): Pt will maintain sitting balance with mod A or less for 3 minutes during functional task. OT Short Term Goal 3 (Week 2): Pt will sustain attention during bathing and dressing for 2 minutes with mod multimodal cues.  Skilled Therapeutic Interventions/Progress Updates:    Upon entering the room, pt supine in bed and resting. No signs or symptoms of pain this session. Supine >sit with total A and pt unable to follow commands with increased time. Pt seated on EOB for  2 minutes with mod - max A static sitting balance. +2 squat pivot transfer from bed > wheelchair. Pt begins asking therapist, "Where is bathroom?" He then states that he needs to have BM. Pt returned to room and transferred onto drop arm commode over toilet with total A of 2 for safety. Pt required second helper to pull clothing down while therapist total assisted pt in standing. Pt was incontinent of urine and soiled clothing. He had successful BM and initiated routine task of grabbing toilet paper and attempting hygiene on his own. Pt needing assistance for hygiene. Pt returning to wheelchair and assisted back to bed for LB clothing management. Max  A rolling L <> R to don brief. B UE mittens donned and wrist restraints in place. Pt resting peacefully upon exiting the room.   Therapy Documentation Precautions:  Precautions Precautions: Fall Precaution Comments: NPO (NG tube), BUE mittens, bone flap removed R side of head Restrictions Weight Bearing  Restrictions: No  See Function Navigator for Current Functional Status.   Therapy/Group: Individual Therapy  Gypsy Decant 02/09/2017, 9:43 PM

## 2017-02-10 ENCOUNTER — Inpatient Hospital Stay (HOSPITAL_COMMUNITY): Payer: Self-pay | Admitting: Occupational Therapy

## 2017-02-10 ENCOUNTER — Inpatient Hospital Stay (HOSPITAL_COMMUNITY): Payer: Self-pay | Admitting: Speech Pathology

## 2017-02-10 ENCOUNTER — Inpatient Hospital Stay (HOSPITAL_COMMUNITY): Payer: Self-pay | Admitting: Physical Therapy

## 2017-02-10 LAB — GLUCOSE, CAPILLARY
GLUCOSE-CAPILLARY: 133 mg/dL — AB (ref 65–99)
GLUCOSE-CAPILLARY: 192 mg/dL — AB (ref 65–99)
GLUCOSE-CAPILLARY: 166 mg/dL — AB (ref 65–99)
Glucose-Capillary: 136 mg/dL — ABNORMAL HIGH (ref 65–99)
Glucose-Capillary: 163 mg/dL — ABNORMAL HIGH (ref 65–99)
Glucose-Capillary: 169 mg/dL — ABNORMAL HIGH (ref 65–99)

## 2017-02-10 NOTE — Progress Notes (Signed)
Speech Language Pathology Daily Session Note  Patient Details  Name: [name] MRN: [id number] Date of Birth: 1966/10/07  Today's Date: 02/10/2017 SLP Individual Time: 1400-1430 SLP Individual Time Calculation (min): 30 min  Short Term Goals: Week 2: SLP Short Term Goal 1 (Week 2): Patient will consume current diet with minimal overt s/s of aspiration with Mod A verbal cues for use of swallowing compensatory strategies.  SLP Short Term Goal 2 (Week 2): Patient will demonstrate sustained attention to a functional task for ~2 minutes with Mod A verbal cues for redirection.  SLP Short Term Goal 3 (Week 2): Patient will orient to place, situation and time with Mod A multimodal cues.  SLP Short Term Goal 4 (Week 2): Patient will verbally respond to questions in 50% of opportunities with Max A multimodal cues.  SLP Short Term Goal 5 (Week 2): Patient will demonstrate basic problem solving for basic tasks with Max A verbal cues.   Skilled Therapeutic Interventions: Skilled treatment session focused on cognitive and dysphagia goals. SLP facilitated session by providing Max A verbal cues for patient to follow 1 step commands during self-care tasks. Patient demonstrated increased verbal expression this session but interpreter demonstrated difficulty understanding patient at the phrase level. SLP also facilitated session by providing Max-Total A verbal and tactile cues for a slow rate of self-feeding and intermittent swallow initiation with lunch of Dys. 1 textures with thin liquids. Patient consumed meak without overt s/s of aspiration. Patient demonstrated sustained attention to self-feeding for ~2 minutes with Mod A verbal cues for redirection. Patient left upright in bed with all needs within reach and NT present. Continue with current plan of care.               Function:  Eating Eating   Modified Consistency Diet: Yes Eating Assist Level: More than reasonable amount of  time;Supervision or verbal cues;Set up assist for   Eating Set Up Assist For: Opening containers       Cognition Comprehension Comprehension assist level: Understands basic 25 - 49% of the time/ requires cueing 50 - 75% of the time  Expression   Expression assist level: Expresses basic 25 - 49% of the time/requires cueing 50 - 75% of the time. Uses single words/gestures.  Social Interaction Social Interaction assist level: Interacts appropriately less than 25% of the time. May be withdrawn or combative.  Problem Solving Problem solving assist level: Solves basic problems with no assist;Solves basic less than 25% of the time - needs direction nearly all the time or does not effectively solve problems and may need a restraint for safety  Memory Memory assist level: Recognizes or recalls less than 25% of the time/requires cueing greater than 75% of the time    Pain No/Denies Pain   Therapy/Group: Individual Therapy  [name] 02/10/2017, 3:37 PM

## 2017-02-10 NOTE — Progress Notes (Signed)
Physical Therapy Session Note  Patient Details  Name: [name] MRN: [id number] Date of Birth: 1966-06-11  Today's Date: 02/10/2017 PT Individual Time: 0800-0900 PT Individual Time Calculation (min): 60 min   Short Term Goals: Week 2:  PT Short Term Goal 1 (Week 2): Pt will consistently participate in supine<>sitting with max assist +1. PT Short Term Goal 2 (Week 2): Pt will stand with max assist +1. PT Short Term Goal 3 (Week 2): Pt will consistently participate in bed<>w/c transfers with max assist +1.   Skilled Therapeutic Interventions/Progress Updates: Pt received supine in bed with handoff from NT; denies pain and no evidence of pain throughout session via FACES scale. Pants donned in supine with maxA +2; pt assisting with lifting LEs to allow therapist to thread pants, minimal bridging to A with pulling up pants. Supine>sit maxA for initiation and trunk control. Seated on EOB, shirt donned maxA with pt threading RUE in only without assist, attempts all other steps but requires assist and max cues. Transfer bed>w/c with totalA +2. Attempted w/c propulsion with BLEs in standard manual hemi-height w/c; hand-over-foot cues, demonstration all unsuccessful. Pt does initiate LE activation when cued to push backwards however not enough to be functional for w/c propulsion. Standing frame x10 min with no report of orthostasis. Engaged pt in ring toss activity; appropriate command following with RUE, however unable with LUE d/t apparent limb apraxia, however does use LUE in task spontaneously. Pt became increasingly restless the longer he stood, pulling at PICC, NG tube and helmet requiring repetitive cueing to stop. Returned to w/c and assisted with scooting R hip back in seat, totalA for L side. Headrest adjusted for proper alignment. Returned to room totalA. Engaged in towel folding activity with min cues; able to perform accurately with small washcloth, unable with large towel. Handoff to  NT to eat breakfast at end of session.      Therapy Documentation Precautions:  Precautions Precautions: Fall Precaution Comments: NPO (NG tube), BUE mittens, bone flap removed R side of head Restrictions Weight Bearing Restrictions: No  See Function Navigator for Current Functional Status.   Therapy/Group: Individual Therapy  Vista Lawman 02/10/2017, 9:11 AM

## 2017-02-10 NOTE — Progress Notes (Signed)
Physical Therapy Session Note  Patient Details  Name: [name] MRN: [id number] Date of Birth: [birth year]  Today's Date: 02/10/2017 PT Individual Time: 1105-1130  Short Term Goals: Week 2:  PT Short Term Goal 1 (Week 2): Pt will consistently participate in supine<>sitting with max assist +1. PT Short Term Goal 2 (Week 2): Pt will stand with max assist +1. PT Short Term Goal 3 (Week 2): Pt will consistently participate in bed<>w/c transfers with max assist +1.   Skilled Therapeutic Interventions/Progress Updates: Pt presented in TIS chair with interpreter present and OT present. Transported to [employer] for trial of [employer]. Pt required max cues for task as pt would begin to touch areas outside of target zone. Pt tolerating approx of activity before becoming increasingly restless and refusing to continue to participate in activity. Transported to day room trial to perform cognition activities including recall of month and season. Pt continued to demonstrate increased restlessness and attempt to pull at PICC line. Pt returned to room performed squat pivot transfer with OT lead. Pt left in bed with restraints on per request from nsg until PICC line removed.      Therapy Documentation Precautions:  Precautions Precautions: Fall Precaution Comments: NPO (NG tube), BUE mittens, bone flap removed R side of head Restrictions Weight Bearing Restrictions: No General: PT Amount of Missed Time (min): 20 Minutes PT Missed Treatment Reason: Patient fatigue Vital Signs: Therapy Vitals Temp: 97.8 F (36.6 C) Temp Source: Axillary Pulse Rate: (!) 109 Resp: 20 BP: 103/71 Patient Position (if appropriate): Lying Oxygen Therapy SpO2: 96 % O2 Device: Not Delivered Pain:          See Function Navigator for Current Functional Status.   Therapy/Group: Individual Therapy  Kahlea Cobert  Jacquita Mulhearn, PTA 02/10/2017, 3:39 PM

## 2017-02-10 NOTE — Progress Notes (Signed)
Occupational Therapy Session Note  Patient Details  Name: [name] MRN: [id number] Date of Birth: 1966/06/01  Today's Date: 02/10/2017 OT Individual Time: 1000-1107 and 1300-1345 OT Individual Time Calculation (min): 67 min and 45 min    Short Term Goals: Week 2:  OT Short Term Goal 1 (Week 2): Pt will perform toilet transfer with max A of 1 in order to decrease level of assistance needed. OT Short Term Goal 2 (Week 2): Pt will maintain sitting balance with mod A or less for 3 minutes during functional task. OT Short Term Goal 3 (Week 2): Pt will sustain attention during bathing and dressing for 2 minutes with mod multimodal cues.  Skilled Therapeutic Interventions/Progress Updates:     Session 1: Upon entering the room, pt supine in bed with sister and interpreter present. Pt with no c/o,signs, or symptoms of pain. Pt stating needs to therapist by reporting his need to have BM. Supine >sit with max A to EOB. Total A +2 squat pivot transfer from bed >wheelchair. Transfer in same manner onto toilet in bathroom. Pt bearing weight through B LE's and standing with total A while second helper pulls clothing down. Pt motioned he needed to urinate as well and OT assisted with urinal provided. Pt sitting on toilet with min - mod A. OT performed hygiene and clothing management in standing with second helper. Pt returned to wheelchair and washed hands at sink with hand over hand assistance. Pt brushing teeth at sink with set up A and mod verbal cues for sequencing and initiation. Pt pushing wheelchair with R hand and max multimodal cues for task. Pt becoming very upset at end of session and stating, " They (therapy/staff) aren't helping me and they are making me do whatever they want." OT and family member attempting to provide education. OT taking pt to mirror and showing pt surgical site at head and abdomen. Pt continues to not show awareness of situation. Pt remained in wheelchair until pt  transitioned to PT session.   Session 2: Upon entering the room, pt in bed with interpreter present and reports no c/o pain. Pt remains in bed this session. TV remains on in room throughout session creating an external distraction. Pt able to attend to tasks with max cues throughout session. Pt given various cards with colors and shapes and pt able to correctly answer questions regarding cards with increased time and max multimodal cues. Pt initiated pulling zipper tab with increased time of 8 minutes and initially hand over hand demonstration decreasing to verbal cues for task. Pt remaining in bed at end of session with wrist restraints donned. Call bell and all needed items within reach upon exiting the room.   Therapy Documentation Precautions:  Precautions Precautions: Fall Precaution Comments: NPO (NG tube), BUE mittens, bone flap removed R side of head Restrictions Weight Bearing Restrictions: No General: General PT Missed Treatment Reason: Patient fatigue Vital Signs: Therapy Vitals Pulse Rate: (!) 115 BP: 119/78  See Function Navigator for Current Functional Status.   Therapy/Group: Individual Therapy  [name] 02/10/2017, 1:53 PM

## 2017-02-10 NOTE — Progress Notes (Signed)
[facility] PHYSICAL MEDICINE & REHABILITATION     PROGRESS NOTE    Subjective/Complaints: Uneventful night. Up with therapy in w/c. [employer] tream was off duty yesterday and NGT was not pulled. Still has NGT and wrist restraints this morning as a result  ROS: Limited due cognitive/behavioral   Objective: Vital Signs: Blood pressure 99/68, pulse 100, temperature 97.8 F (36.6 C), temperature source Axillary, resp. rate 17, height  (1.575 m), weight 60.5 kg (133 lb 6.1 oz), SpO2 97 %. Dg Swallowing Func-speech Pathology  Result Date: 02/09/2017 Objective Swallowing Evaluation: Type of Study: MBS-Modified Barium Swallow Study Patient Details Name: [name] MRN: [id number] Date of Birth: May 24, 1966 Today's Date: 02/09/2017 Time: SLP Start Time (ACUTE ONLY): 0950-SLP Stop Time (ACUTE ONLY): 1020 SLP Time Calculation (min) (ACUTE ONLY): 30 min Past Medical History: Past Medical History: Diagnosis Date . Diabetes mellitus without complication [facility])  Past Surgical History: Past Surgical History: Procedure Laterality Date . 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 Patient's oral phase is characterized by prolonged AP transit with intermittent oral holding and premature spillage which are exacerbated by cognitive impairments. Pharyngeal impairments are characterized by a delayed swallow initiation with all consistencies but  strength appeared [facility]. Patient able to fully protect his airway without penetration or aspiration observed and his swallow initiation appeared more timely with use of a straw. Recommend patient initiate a diet of Dys. 1  textures with thin liquids and full supervision to maximize safety with use of strategies.  SLP Visit Diagnosis Dysphagia, oral phase (R13.11);Dysphagia, oropharyngeal phase (R13.12)  --  -- Impact on safety and function Mild aspiration risk   CHL IP TREATMENT RECOMMENDATION 02/09/2017 Treatment Recommendations Therapy as outlined in treatment plan below   Prognosis 02/09/2017 Prognosis for Safe Diet Advancement Fair Barriers to Reach Goals Cognitive deficits;Severity of deficits Barriers/Prognosis Comment -- CHL IP DIET RECOMMENDATION 02/09/2017 SLP Diet Recommendations Dysphagia 1 (Puree) solids;Thin liquid Liquid Administration via Cup;Straw Medication Administration Crushed with puree Compensations Minimize environmental distractions;Slow rate;Small sips/bites Postural Changes Seated upright at 90 degrees   CHL IP OTHER RECOMMENDATIONS 02/09/2017 Recommended Consults -- Oral Care Recommendations Oral care BID Other Recommendations --   CHL IP FOLLOW UP RECOMMENDATIONS 02/09/2017 Follow up Recommendations Inpatient Rehab   CHL IP FREQUENCY AND DURATION 02/09/2017 Speech Therapy Frequency (ACUTE ONLY) min 5x/week Treatment Duration 3 weeks      CHL IP ORAL PHASE 02/09/2017 Oral Phase Impaired Oral - Pudding Teaspoon -- Oral - Pudding Cup -- Oral - Honey Teaspoon -- Oral - Honey Cup -- Oral - Nectar Teaspoon Delayed oral transit;Premature spillage Oral - Nectar Cup Delayed oral transit;Holding of bolus Oral - Nectar Straw -- Oral - Thin Teaspoon Delayed oral transit;Premature spillage Oral - Thin Cup Delayed oral transit;Premature spillage;Holding of bolus Oral - Thin Straw -- Oral - Puree Delayed oral transit Oral - Mech Soft -- Oral - Regular -- Oral - Multi-Consistency -- Oral - Pill -- Oral Phase - Comment --  CHL IP PHARYNGEAL PHASE 02/09/2017 Pharyngeal Phase Impaired Pharyngeal- Pudding Teaspoon -- Pharyngeal -- Pharyngeal- Pudding Cup -- Pharyngeal -- Pharyngeal- Honey Teaspoon -- Pharyngeal -- Pharyngeal- Honey  Cup -- Pharyngeal -- Pharyngeal- Nectar Teaspoon Delayed swallow initiation-vallecula Pharyngeal -- Pharyngeal-  Nectar Cup Delayed swallow initiation-vallecula Pharyngeal -- Pharyngeal- Nectar Straw -- Pharyngeal -- Pharyngeal- Thin Teaspoon Delayed swallow initiation-pyriform sinuses Pharyngeal -- Pharyngeal- Thin Cup Delayed swallow initiation-pyriform sinuses Pharyngeal -- Pharyngeal- Thin Straw Delayed swallow initiation-vallecula Pharyngeal -- Pharyngeal- Puree Delayed swallow initiation-vallecula Pharyngeal -- Pharyngeal- Mechanical Soft -- Pharyngeal -- Pharyngeal- Regular -- Pharyngeal -- Pharyngeal- Multi-consistency -- Pharyngeal -- Pharyngeal- Pill -- Pharyngeal -- Pharyngeal Comment --  CHL IP CERVICAL ESOPHAGEAL PHASE 02/09/2017 Cervical Esophageal Phase WFL Pudding Teaspoon -- Pudding Cup -- Honey Teaspoon -- Honey Cup -- Nectar Teaspoon -- Nectar Cup -- Nectar Straw -- Thin Teaspoon -- Thin Cup -- Thin Straw -- Puree -- Mechanical Soft -- Regular -- Multi-consistency -- Pill -- Cervical Esophageal Comment -- No flowsheet data found. PAYNE, COURTNEY 02/09/2017, 3:08 PM               Recent Labs  02/08/17 0540  WBC 4.0  HGB 10.7*  HCT 33.0*  PLT 312    Recent Labs  02/08/17 0540  NA 138  K 3.6  CL 104  GLUCOSE 132*  BUN 12  CREATININE 0.45*  CALCIUM 8.7*   CBG (last 3)   Recent Labs  02/10/17 0020 02/10/17 0714 02/10/17 0814  GLUCAP 163* 136* 133*    Wt Readings from Last 3 Encounters:  02/10/17 60.5 kg (133 lb 6.1 oz)  02/01/17 65.6 kg (144 lb 9.6 oz)  07/31/16 68 kg (150 lb)    Physical Exam:    Gen: NAD. Well developed.  HENT: Craniotomy site clean and dry. +NGT remains in place Cardiovascular: RRR Respiratory:  CTA B GI: Soft. Bowel sounds are normal. Neurological: He is alert.  Patient is distracted. Follows simple commands Moves all 4's but much slower to initiate with the left arm and leg.   Psychiatric:   Restless but not agitated Skin. Warm and  dry. Intact.   Assessment/Plan: 1. Cognitive and functional deficits secondary to right traumatic ICH/SDH s/p craniectomy which require 3+ hours per day of interdisciplinary therapy in a comprehensive inpatient rehab setting. Physiatrist is providing close team supervision and 24 hour management of active medical problems listed below. Physiatrist and rehab team continue to assess barriers to discharge/monitor patient progress toward functional and medical goals.  Function:  Bathing Bathing position   Position: Wheelchair/chair at sink  Bathing parts Body parts bathed by patient: Right upper leg, Left upper leg, Abdomen, Chest, Left arm Body parts bathed by helper: Right arm, Right lower leg, Left lower leg, Back  Bathing assist Assist Level: 2 helpers      Upper Body Dressing/Undressing Upper body dressing   What is the patient wearing?: Pull over shirt/dress       Pull over shirt/dress - Perfomed by helper: Thread/unthread right sleeve, Thread/unthread left sleeve, Put head through opening, Pull shirt over trunk        Upper body assist Assist Level:  (total A)      Lower Body Dressing/Undressing Lower body dressing   What is the patient wearing?: Pants       Pants- Performed by helper: Thread/unthread right pants leg, Thread/unthread left pants leg, Pull pants up/down   Non-skid slipper socks- Performed by helper: Don/doff right sock, Don/doff left sock               TED Hose - Performed by helper: Don/doff right TED hose, Don/doff left TED hose  Lower body assist Assist for lower body dressing: 2 Designer, multimedia  activity did not occur: Safety/medical concerns   Toileting steps completed by helper: Adjust clothing prior to toileting, Performs perineal hygiene, Adjust clothing after toileting    Toileting assist Assist level: Two helpers   Transfers Chair/bed transfer   Chair/bed transfer method: Squat pivot Chair/bed transfer  assist level: 2 helpers Chair/bed transfer assistive device: Armrests     Locomotion Ambulation Ambulation activity did not occur: Safety/medical concerns   Max distance: 30 ft Assist level: 2 helpers   Education administrator activity did not occur: Safety/medical concerns     Assist Level: Dependent (Pt equals 0%) (TIS)  Cognition Comprehension Comprehension assist level: Other (comment) (spanish speaking)  Expression Expression assist level: Expresses basis less than 25% of the time/requires cueing >75% of the time.  Social Interaction Social Interaction assist level: Interacts appropriately less than 25% of the time. May be withdrawn or combative.  Problem Solving Problem solving assist level: Solves basic problems with no assist, Solves basic less than 25% of the time - needs direction nearly all the time or does not effectively solve problems and may need a restraint for safety  Memory Memory assist level: Recognizes or recalls less than 25% of the time/requires cueing greater than 75% of the time  Medical Problem List and Plan: 1. Decreased functional mobility with cognitive deficitssecondary to right IPH/SDH/temporal bone fracture after fall S/P right frontal temporoparietal craniectomy 01/14/2017 with placement of bone flap in the abdomen  Cont CIR  Cont restraints  -RLAS IV with expected behaviors 2. DVT Prophylaxis/Anticoagulation: Subcutaneous heparin. Monitor platelet counts and any signs of bleeding. 3. Pain Management: Hycet  4. Mood: Ativan as needed 5. Neuropsych: This patient is notcapable of making decisions on hisown behalf.  -sleep chart--improved patterns overall  -seroquel for sleep/agitation  -continue seroquel during the day for agitation   -limit benzos  -continue ritalin for arousal and initiation  -moving to enclosure bed once NGT out  -soft helmet for safety 6. Skin/Wound Care: Routine skin checks  -helmet for cranium protection  -flap site   intact 7. Fluids/Electrolytes/Nutrition: NGT for nutrition 8.Seizure prophylaxis. Keppra 750 mg twice daily 9.Pneumonia. Five-day course of Ancef  completed 01/22/2017 10.Dysphagia. Advanced to D1, thins diet.   -remove NGT    11.Urinary retention/Escherichia coli UTI. Held Urecholine.Completed course of Cipro.   -condom cath 12.Diabetes mellitus with peripheral neuropathy. Hemoglobin A1c 10.4. Lantus insulin 15 units twice a day. Patient on Glucophage 500 mg twice a day and Glucotrol 5 mg daily   Relatively controlled at present 13.History of hypertension. Patient on lisinopril 2.5 mg daily prior to admission. Resume as needed  Under control 14.Constipation. Laxative assistance  LOS (Days) 9 A FACE TO FACE EVALUATION WAS PERFORMED  Faith Rogue T, MD 02/10/2017 9:00 AM

## 2017-02-10 NOTE — Progress Notes (Signed)
Social Work Patient ID: [name], male   DOB: 02/13/1966, 51 y.o.   MRN: [id number]   Met with pt and sister this morning (and interpreter) to review team conference.  Sister aware that ELOS remains at 4 weeks at this point.  Pt's NG tube had been removed and we discussed plan to change pt to enclosure bed now that this is gone. Sister expressing concerns about potential of pt being d/c'd prior to flap surgery and states, "I don't want him to come home without that (bone flap replacement)...with his behavior I don't feel like that would be safe." Will need to have further discussion about this - will check with MD if plans known about flap.  Continue to follow.  Sarin Comunale, LCSW

## 2017-02-10 NOTE — Progress Notes (Signed)
Occupational Therapy Session Note  Patient Details  Name: [name] MRN: [id number] Date of Birth: Dec 07, 1965  Today's Date: 02/10/2017 OT Individual Time: 1130-1145 (co-tx with PT 11:00-11:45) OT Individual Time Calculation (min): 15 min  (45 min total)   Short Term Goals: Week 2:  OT Short Term Goal 1 (Week 2): Pt will perform toilet transfer with max A of 1 in order to decrease level of assistance needed. OT Short Term Goal 2 (Week 2): Pt will maintain sitting balance with mod A or less for 3 minutes during functional task. OT Short Term Goal 3 (Week 2): Pt will sustain attention during bathing and dressing for 2 minutes with mod multimodal cues.  Skilled Therapeutic Interventions/Progress Updates:    Pt seen for OT session and co-tx with PT. Session focusing on task initiation, attention, and following simple commands. See PT note for tx details.  Pt fatiguing at end of session, pointing and nodding head to return to bed. Completed max- total A +2 squat pivot transfer to bed. Max tactile and verbal cuing for bed mobility sequencing to position pt comfortably in bed. Pt left in supine with wrist restraints donned as enclosure bed had not yet arrived. Pt already asleep as therapists exited.   Therapy Documentation Precautions:  Precautions Precautions: Fall Precaution Comments: NPO (NG tube), BUE mittens, bone flap removed R side of head Restrictions Weight Bearing Restrictions: No  See Function Navigator for Current Functional Status.   Therapy/Group: Individual Therapy  Lewis, Leiyah Maultsby C 02/10/2017, 12:17 PM

## 2017-02-11 ENCOUNTER — Inpatient Hospital Stay (HOSPITAL_COMMUNITY): Payer: Self-pay | Admitting: Physical Therapy

## 2017-02-11 ENCOUNTER — Inpatient Hospital Stay (HOSPITAL_COMMUNITY): Payer: Self-pay | Admitting: Speech Pathology

## 2017-02-11 ENCOUNTER — Inpatient Hospital Stay (HOSPITAL_COMMUNITY): Payer: Self-pay | Admitting: Occupational Therapy

## 2017-02-11 ENCOUNTER — Inpatient Hospital Stay (HOSPITAL_COMMUNITY): Payer: Self-pay

## 2017-02-11 LAB — GLUCOSE, CAPILLARY
GLUCOSE-CAPILLARY: 118 mg/dL — AB (ref 65–99)
Glucose-Capillary: 138 mg/dL — ABNORMAL HIGH (ref 65–99)
Glucose-Capillary: 189 mg/dL — ABNORMAL HIGH (ref 65–99)
Glucose-Capillary: 170 mg/dL — ABNORMAL HIGH (ref 65–99)

## 2017-02-11 MED ORDER — METHYLPHENIDATE HCL 5 MG PO TABS
10.0000 mg | ORAL_TABLET | Freq: Two times a day (BID) | ORAL | Status: DC
  Administered 2017-02-11 – 2017-03-18 (×70): 10 mg via ORAL
  Filled 2017-02-11 (×71): qty 2

## 2017-02-11 NOTE — Progress Notes (Signed)
Occupational Therapy Session Note  Patient Details  Name: [name] MRN: [id number] Date of Birth: 05/12/1966  Today's Date: 02/11/2017 OT Individual Time: 1000-1055 OT Individual Time Calculation (min): 55 min    Short Term Goals: Week 2:  OT Short Term Goal 1 (Week 2): Pt will perform toilet transfer with max A of 1 in order to decrease level of assistance needed. OT Short Term Goal 2 (Week 2): Pt will maintain sitting balance with mod A or less for 3 minutes during functional task. OT Short Term Goal 3 (Week 2): Pt will sustain attention during bathing and dressing for 2 minutes with mod multimodal cues.  Skilled Therapeutic Interventions/Progress Updates:     Upon entering the room, pt seated in wheelchair with sister and interpreter present in the room. Pt agreeable to OT intervention. Pt appeared to have food in mouth. OT assisted pt to sink and handed pt items for brushing. Pt initiated applying tooth paste, turning on water, and brushing teeth with min multimodal cues for initiation and sequence. Pt washing hands with hand over hand assistance to attend to L UE and incorporate into task. Pt unable to propel wheelchair with use of B LE's after max verbal and demonstrational cues pt is unable to do task. Pt is able to propel wheelchair with R UE and max multimodal cues for short distances with assistance from therapist to keep chair from hitting items on the R. Pt able to verbally navigate back towards room with mod cues to find correct room once in hallway. Pt does remember and report this therapist name at end of session. Pt transferred from wheelchair >vale be with total A to L. Vale bed secured and all needs within reach.   Therapy Documentation Precautions:  Precautions Precautions: Fall Precaution Comments: NPO (NG tube), BUE mittens, bone flap removed R side of head Restrictions Weight Bearing Restrictions: No  See Function Navigator for Current Functional  Status.   Therapy/Group: Individual Therapy  [name] 02/11/2017, 12:05 PM

## 2017-02-11 NOTE — Progress Notes (Addendum)
Physical Therapy Note  Patient Details  Name: [name] MRN: [id number] Date of Birth: 04-24-1966 Today's Date: 02/11/2017  1400-1430, 30 min individual tx Pain:difficult to assess; but no signs of pain per FACES scale  Pt brought into gym by previous therapist.  Pt quite agitated in w/c, sliding forward unsafely, stating he needs to move.  +2 to scoot him back into w/c into sitting posture, for +2 squat pivot. Pt lay in supine and rested, then participated with activiey assistive bil lower trunk rotation, in R side lying hip/knee flex/ext.  Pt sat up with max assist.  In sitting, pt leaned heavily L initially, but with task for R hand he achieved midline orientation.  Pt used R hand to push rolling stool forwar/backward to achieve forward wt shift. Sit>< stand x 6 using bil hands on back of armchair, with increased wt bearing RLE. squat pivot to return to w/c with max assist, to R.  Squat pivot to return to enclusure bed.  Mittens donned, HOB raised to 30 degrees.  Pt left resting with call bell within enclosure.  See function navigator for current status.   Aqib Lough 02/11/2017, 9:23 AM

## 2017-02-11 NOTE — Progress Notes (Signed)
Physical Therapy Session Note  Patient Details  Name: [name] MRN: [id number] Date of Birth: Nov 17, 1965  Today's Date: 02/11/2017 PT Individual Time: 0800-0900 PT Individual Time Calculation (min): 60 min   Short Term Goals: Week 2:  PT Short Term Goal 1 (Week 2): Pt will consistently participate in supine<>sitting with max assist +1. PT Short Term Goal 2 (Week 2): Pt will stand with max assist +1. PT Short Term Goal 3 (Week 2): Pt will consistently participate in bed<>w/c transfers with max assist +1.   Skilled Therapeutic Interventions/Progress Updates: Pt received supine in enclosure bed; family present upon arrival however left shortly after. Pt denies pain and no evidence of pain throughout session via FACES scale. Supine>sit maxA with pt initiating rolling. Seated EOB, min/modA for static sitting balance. Pt dons shirt with minA for pulling shirt over trunk, totalA for balance during task. Sit >stand in stedy with maxA; pt with strong posterior lean/push and it was very difficult to anteriorly weight shift pt to bring trunk to neutral and flip seat pads down. While standing in stedy for <1 min, pt lifting up one leg at a time, trying to step over knee pad, reaching for objects outside of stedy. Transferred to w/c totalA in stedy. Pt assisted with propulsion with RUE x75', maxA for navigation d/t only using R extremity. Sit >stand in stedy attempted several times with variable amounts/types of cues, with goal of pt initiating more of activity to improve ease of use of stedy; ultimately unable to initiate any assistance to stand. Attempted sit >stand in parallel bars; pt again reaching for bars, leaning forward however unable to motor plan or sustain attention to task to preform stand. Seated in w/c semi-reclined for safety, pt engaged in basketball catching/throwing and shooting at goal; pt demo sustained attention x5 min while performing this task with min cues. Returned to room  totalA. Squat pivot to bed totalA. Sit >supine maxA and requiring assist to reposition in bed. Once in bed, pt reports he would like to drink some orange juice. Therapist obtained drink, helped pt to EOB with modA and improved initiation. ModA static sitting balance while drinking orange juice. Returned to supine minA for LLE management. Remained in enclosure bed at end of session, all needs in reach.      Therapy Documentation Precautions:  Precautions Precautions: Fall Precaution Comments: NPO (NG tube), BUE mittens, bone flap removed R side of head Restrictions Weight Bearing Restrictions: No  See Function Navigator for Current Functional Status.   Therapy/Group: Individual Therapy  Vista Lawman 02/11/2017, 9:06 AM

## 2017-02-11 NOTE — Progress Notes (Signed)
Speech Language Pathology Daily Session Note  Patient Details  Name: [name] MRN: [id number] Date of Birth: Dec 05, 1965  Today's Date: 02/11/2017 SLP Individual Time: 1300-1400 SLP Individual Time Calculation (min): 60 min  Short Term Goals: Week 2: SLP Short Term Goal 1 (Week 2): Patient will consume current diet with minimal overt s/s of aspiration with Mod A verbal cues for use of swallowing compensatory strategies.  SLP Short Term Goal 2 (Week 2): Patient will demonstrate sustained attention to a functional task for ~2 minutes with Mod A verbal cues for redirection.  SLP Short Term Goal 3 (Week 2): Patient will orient to place, situation and time with Mod A multimodal cues.  SLP Short Term Goal 4 (Week 2): Patient will verbally respond to questions in 50% of opportunities with Max A multimodal cues.  SLP Short Term Goal 5 (Week 2): Patient will demonstrate basic problem solving for basic tasks with Max A verbal cues.   Skilled Therapeutic Interventions: Skilled treatment session focused on cognitive goals. SLP facilitated session by providing Max-Total A for sorting from a field of 4, problem solving and sustained attention to a functional task for ~30 second intervals. Patient exteremly restless throughout session and consistently removing his helmet, pulling at his pants and propelling his wheelchair. Patient required total A for orientation to place and demonstrated orientation to situation and month from a field of two. Patient with increased verbal expression this session that can be characterized by language of confusion and asking to "leave here." Patient handed off to PT. Continue with current plan of care.      Function:  Cognition Comprehension Comprehension assist level: Understands basic 25 - 49% of the time/ requires cueing 50 - 75% of the time  Expression   Expression assist level: Expresses basic 25 - 49% of the time/requires cueing 50 - 75% of the time. Uses  single words/gestures.  Social Interaction Social Interaction assist level: Interacts appropriately less than 25% of the time. May be withdrawn or combative.  Problem Solving Problem solving assist level: Solves basic problems with no assist;Solves basic less than 25% of the time - needs direction nearly all the time or does not effectively solve problems and may need a restraint for safety  Memory Memory assist level: Recognizes or recalls less than 25% of the time/requires cueing greater than 75% of the time    Pain No/Denies Pain   Therapy/Group: Individual Therapy  Christian Treadway 02/11/2017, 3:08 PM

## 2017-02-11 NOTE — Progress Notes (Signed)
Physical Therapy Session Note  Patient Details  Name: [name] MRN: [id number] Date of Birth: [birth year]  Today's Date: 02/11/2017 PT Individual Time: 1100-1200 PT Individual Time Calculation (min): 60 min   Short Term Goals: Week 2:  PT Short Term Goal 1 (Week 2): Pt will consistently participate in supine<>sitting with max assist +1. PT Short Term Goal 2 (Week 2): Pt will stand with max assist +1. PT Short Term Goal 3 (Week 2): Pt will consistently participate in bed<>w/c transfers with max assist +1.   Skilled Therapeutic Interventions/Progress Updates:    Pt presents in enclosure bed with soiled brief. Mod assist to come to EOB for supine > sit with cues for technique. Transferred with squat pivot technique to w/c with max assist +2 for safety with cues for hand placement and facilitation of weightshift and controlled movement. Transferred in similar manner to toilet using grab bar for support with attempts for sit <> stand and total assist to remove soiled brief. Pt demonstrates initiation to grab toilet paper for hygiene but unable to complete effectively due to perceptual impairments, impaired coordination, and decreased attention - pt increasingly restless and requiring redirection. Using [employer], performed total assist hygiene in standing and transferred back to w/c. Assisted patient with donning pants and new shirt (also soiled). Using [employer] engaged in neuro re-ed for gait training with focus on automatic reciprocal movement of BLE, postural control, weightshifting, and upright tolerance. Pt required +2 assist on each side to aid with weightshifting and one person to steer [employer]. Pt able to activate LLE with minimal need for physical advancement. Increased tone noted on R and difficulty completing weightshift onto the L to advance RLE without total assist. Pt somewhat resistive at times as well through trunk and pushing away from lift equipment. Able to progress x 30'  before noted fatigue and pt letting himself flex into the lift.   Therapy Documentation Precautions:  Precautions Precautions: Fall, Other (comment) Precaution Comments: bone flap removed R side of head in abdomen; hemet for OOB; Pt no longer with NG or PICC; enclosure bed Required Braces or Orthoses: Other Brace/Splint Other Brace/Splint: helmet for OOB Restrictions Weight Bearing Restrictions: No  Pain:  Did not report pain.    See Function Navigator for Current Functional Status.   Therapy/Group: Individual Therapy  Karolee Stamps Darrol Poke, PT, DPT  02/11/2017, 12:13 PM

## 2017-02-11 NOTE — Progress Notes (Signed)
[name] PHYSICAL MEDICINE & REHABILITATION     PROGRESS NOTE    Subjective/Complaints: Did fairly well last night. Sister concerned about him scratching his head. Pt denies pain  ROS: pt denies nausea, vomiting, diarrhea, cough, shortness of breath or chest pain    Objective: Vital Signs: Blood pressure 106/81, pulse 100, temperature 97.8 F (36.6 C), temperature source Axillary, resp. rate 18, height  (1.575 m), weight 60.5 kg (133 lb 6.1 oz), SpO2 98 %. Dg Swallowing Func-speech Pathology  Result Date: 02/09/2017 Objective Swallowing Evaluation: Type of Study: MBS-Modified Barium Swallow Study Patient Details Name: [name] MRN: [id number] Date of Birth: Apr 07, 1966 Today's Date: 02/09/2017 Time: SLP Start Time (ACUTE ONLY): 0950-SLP Stop Time (ACUTE ONLY): 1020 SLP Time Calculation (min) (ACUTE ONLY): 30 min Past Medical History: Past Medical History: Diagnosis Date . Diabetes mellitus without complication [facility] 2)  Past Surgical History: Past Surgical History: Procedure Laterality Date . CRANIOTOMY Right 01/13/2017  Procedure: 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, Placement of bone flap in abdomen;  Surgeon: Coletta Memos, MD;  Location: [facility] OR;  Service: Neurosurgery;  Laterality: Right; HPI: See H&P Assessment / Plan / Recommendation CHL IP CLINICAL IMPRESSIONS 02/09/2017 Clinical Impression Patient demonstrates a mild oropharyngeal dysphagia. Patient's oral phase is characterized by prolonged AP transit with intermittent oral holding and premature spillage which are exacerbated by cognitive impairments. Pharyngeal impairments are characterized by a delayed swallow initiation with all consistencies but  strength appeared [facility]. Patient able to fully protect his airway without penetration or aspiration observed and his swallow initiation appeared more timely with use of a straw. Recommend patient initiate a diet of Dys. 1 textures with thin liquids and  full supervision to maximize safety with use of strategies.  SLP Visit Diagnosis Dysphagia, oral phase (R13.11);Dysphagia, oropharyngeal phase (R13.12)  --  -- Impact on safety and function Mild aspiration risk   CHL IP TREATMENT RECOMMENDATION 02/09/2017 Treatment Recommendations Therapy as outlined in treatment plan below   Prognosis 02/09/2017 Prognosis for Safe Diet Advancement Fair Barriers to Reach Goals Cognitive deficits;Severity of deficits Barriers/Prognosis Comment -- CHL IP DIET RECOMMENDATION 02/09/2017 SLP Diet Recommendations Dysphagia 1 (Puree) solids;Thin liquid Liquid Administration via Cup;Straw Medication Administration Crushed with puree Compensations Minimize environmental distractions;Slow rate;Small sips/bites Postural Changes Seated upright at 90 degrees   CHL IP OTHER RECOMMENDATIONS 02/09/2017 Recommended Consults -- Oral Care Recommendations Oral care BID Other Recommendations --   CHL IP FOLLOW UP RECOMMENDATIONS 02/09/2017 Follow up Recommendations Inpatient Rehab   CHL IP FREQUENCY AND DURATION 02/09/2017 Speech Therapy Frequency (ACUTE ONLY) min 5x/week Treatment Duration 3 weeks      CHL IP ORAL PHASE 02/09/2017 Oral Phase Impaired Oral - Pudding Teaspoon -- Oral - Pudding Cup -- Oral - Honey Teaspoon -- Oral - Honey Cup -- Oral - Nectar Teaspoon Delayed oral transit;Premature spillage Oral - Nectar Cup Delayed oral transit;Holding of bolus Oral - Nectar Straw -- Oral - Thin Teaspoon Delayed oral transit;Premature spillage Oral - Thin Cup Delayed oral transit;Premature spillage;Holding of bolus Oral - Thin Straw -- Oral - Puree Delayed oral transit Oral - Mech Soft -- Oral - Regular -- Oral - Multi-Consistency -- Oral - Pill -- Oral Phase - Comment --  CHL IP PHARYNGEAL PHASE 02/09/2017 Pharyngeal Phase Impaired Pharyngeal- Pudding Teaspoon -- Pharyngeal -- Pharyngeal- Pudding Cup -- Pharyngeal -- Pharyngeal- Honey Teaspoon -- Pharyngeal -- Pharyngeal- Honey Cup -- Pharyngeal -- Pharyngeal-  Nectar Teaspoon Delayed swallow initiation-vallecula Pharyngeal -- Pharyngeal- Nectar Cup Delayed swallow  initiation-vallecula Pharyngeal -- Pharyngeal- Nectar Straw -- Pharyngeal -- Pharyngeal- Thin Teaspoon Delayed swallow initiation-pyriform sinuses Pharyngeal -- Pharyngeal- Thin Cup Delayed swallow initiation-pyriform sinuses Pharyngeal -- Pharyngeal- Thin Straw Delayed swallow initiation-vallecula Pharyngeal -- Pharyngeal- Puree Delayed swallow initiation-vallecula Pharyngeal -- Pharyngeal- Mechanical Soft -- Pharyngeal -- Pharyngeal- Regular -- Pharyngeal -- Pharyngeal- Multi-consistency -- Pharyngeal -- Pharyngeal- Pill -- Pharyngeal -- Pharyngeal Comment --  CHL IP CERVICAL ESOPHAGEAL PHASE 02/09/2017 Cervical Esophageal Phase WFL Pudding Teaspoon -- Pudding Cup -- Honey Teaspoon -- Honey Cup -- Nectar Teaspoon -- Nectar Cup -- Nectar Straw -- Thin Teaspoon -- Thin Cup -- Thin Straw -- Puree -- Mechanical Soft -- Regular -- Multi-consistency -- Pill -- Cervical Esophageal Comment -- No flowsheet data found. [name], [name] 02/09/2017, 3:08 PM              No results for input(s): WBC, HGB, HCT, PLT in the last 72 hours. No results for input(s): NA, K, CL, GLUCOSE, BUN, CREATININE, CALCIUM in the last 72 hours.  Invalid input(s): CO CBG (last 3)   Recent Labs  02/10/17 1607 02/10/17 2217 02/11/17 0648  GLUCAP 166* 169* 138*    Wt Readings from Last 3 Encounters:  02/10/17 60.5 kg (133 lb 6.1 oz)  02/01/17 65.6 kg (144 lb 9.6 oz)  07/31/16 68 kg (150 lb)    Physical Exam:    Gen: NAD. Well developed.  HENT: Craniotomy site clean and dry. +NGT remains in place Cardiovascular: RRR Respiratory:  CTA B GI: Soft. Bowel sounds are normal. Neurological: He is alert.  Patient remains distracted. Perseverates on rubbing/scratching head   slower to initiate with the left arm and leg.   Psychiatric:   Restless, flat Skin. Warm and dry. Intact.   Assessment/Plan: 1. Cognitive and  functional deficits secondary to right traumatic ICH/SDH s/p craniectomy which require 3+ hours per day of interdisciplinary therapy in a comprehensive inpatient rehab setting. Physiatrist is providing close team supervision and 24 hour management of active medical problems listed below. Physiatrist and rehab team continue to assess barriers to discharge/monitor patient progress toward functional and medical goals.  Function:  Bathing Bathing position   Position: Wheelchair/chair at sink  Bathing parts Body parts bathed by patient: Right upper leg, Left upper leg, Abdomen, Chest, Left arm Body parts bathed by helper: Right arm, Right lower leg, Left lower leg, Back  Bathing assist Assist Level: 2 helpers      Upper Body Dressing/Undressing Upper body dressing   What is the patient wearing?: Pull over shirt/dress     Pull over shirt/dress - Perfomed by patient: Thread/unthread right sleeve Pull over shirt/dress - Perfomed by helper: Thread/unthread left sleeve, Pull shirt over trunk, Put head through opening        Upper body assist Assist Level: 2 helpers      Lower Body Dressing/Undressing Lower body dressing   What is the patient wearing?: Pants       Pants- Performed by helper: Thread/unthread right pants leg, Thread/unthread left pants leg, Pull pants up/down   Non-skid slipper socks- Performed by helper: Don/doff right sock, Don/doff left sock               TED Hose - Performed by helper: Don/doff right TED hose, Don/doff left TED hose  Lower body assist Assist for lower body dressing: 2 Helpers      Toileting Toileting Toileting activity did not occur: Safety/medical concerns   Toileting steps completed by helper: Adjust clothing prior to toileting, Performs perineal  hygiene, Adjust clothing after toileting    Toileting assist Assist level: Two helpers   Transfers Chair/bed transfer   Chair/bed transfer method: Squat pivot Chair/bed transfer assist  level: 2 helpers Chair/bed transfer assistive device: Armrests     Locomotion Ambulation Ambulation activity did not occur: Safety/medical concerns   Max distance: 30 ft Assist level: 2 helpers   Education administrator activity did not occur: Safety/medical concerns   Max wheelchair distance: 20' Assist Level: Maximal assistance (Pt 25 - 49%)  Cognition Comprehension Comprehension assist level: Understands basic 25 - 49% of the time/ requires cueing 50 - 75% of the time  Expression Expression assist level: Expresses basic 25 - 49% of the time/requires cueing 50 - 75% of the time. Uses single words/gestures.  Social Interaction Social Interaction assist level: Interacts appropriately less than 25% of the time. May be withdrawn or combative.  Problem Solving Problem solving assist level: Solves basic problems with no assist, Solves basic less than 25% of the time - needs direction nearly all the time or does not effectively solve problems and may need a restraint for safety  Memory Memory assist level: Recognizes or recalls less than 25% of the time/requires cueing greater than 75% of the time  Medical Problem List and Plan: 1. Decreased functional mobility with cognitive deficitssecondary to right IPH/SDH/temporal bone fracture after fall S/P right frontal temporoparietal craniectomy 01/14/2017 with placement of bone flap in the abdomen  Cont CIR  Cont restraints  -RLAS IV---I spoke to sister at length regarding his recovery. She asked appropriate questions. She is concerned about him scratching head. For now it's ok. Advised her to cut his nails. Place helmet during the day. If he persists in scratching or ends up opening up wound, will need to use wrist restraint.   2. DVT Prophylaxis/Anticoagulation: Subcutaneous heparin. Monitor platelet counts and any signs of bleeding. 3. Pain Management: Hycet  4. Mood: Ativan as needed 5. Neuropsych: This patient is notcapable of making  decisions on hisown behalf.  -sleep chart--improved patterns overall  -seroquel for sleep/agitation  -continue seroquel during the day for agitation   -limit benzos  -continue ritalin for arousal and initiation--increase to   -moving to enclosure bed once NGT out  -soft helmet for safety 6. Skin/Wound Care: Routine skin checks  -helmet for cranium protection  -flap site  Intact  -crani site is clean without breakdown at the moment 7. Fluids/Electrolytes/Nutrition: NGT for nutrition 8.Seizure prophylaxis. Keppra 750 mg twice daily 9.Pneumonia. Five-day course of Ancef  completed 01/22/2017 10.Dysphagia. On D1 thins. Taking meds without issues   11.Urinary retention/Escherichia coli UTI. Held Urecholine.Completed course of Cipro.   -condom cath  -timed voids eventually 12.Diabetes mellitus with peripheral neuropathy. Hemoglobin A1c 10.4. Lantus insulin 15 units twice a day. Patient on Glucophage 500 mg twice a day and Glucotrol 5 mg daily   Relatively controlled at present 13.History of hypertension. Patient on lisinopril 2.5 mg daily prior to admission. Resume as needed  Under control 14.Constipation. Laxative assistance  LOS (Days) 10 A FACE TO FACE EVALUATION WAS PERFORMED  [name], MD 02/11/2017 8:46 AM

## 2017-02-12 ENCOUNTER — Inpatient Hospital Stay (HOSPITAL_COMMUNITY): Payer: Self-pay | Admitting: Occupational Therapy

## 2017-02-12 ENCOUNTER — Inpatient Hospital Stay (HOSPITAL_COMMUNITY): Payer: Self-pay | Admitting: Speech Pathology

## 2017-02-12 ENCOUNTER — Inpatient Hospital Stay (HOSPITAL_COMMUNITY): Payer: Self-pay | Admitting: Physical Therapy

## 2017-02-12 LAB — GLUCOSE, CAPILLARY
GLUCOSE-CAPILLARY: 169 mg/dL — AB (ref 65–99)
GLUCOSE-CAPILLARY: 126 mg/dL — AB (ref 65–99)
Glucose-Capillary: 102 mg/dL — ABNORMAL HIGH (ref 65–99)
Glucose-Capillary: 140 mg/dL — ABNORMAL HIGH (ref 65–99)

## 2017-02-12 MED ORDER — GLIPIZIDE 5 MG PO TABS
5.0000 mg | ORAL_TABLET | Freq: Every day | ORAL | Status: DC
  Administered 2017-02-12 – 2017-03-17 (×34): 5 mg via ORAL
  Filled 2017-02-12 (×35): qty 1

## 2017-02-12 NOTE — Progress Notes (Signed)
Occupational Therapy Session Note  Patient Details  Name: [name] MRN: [id number] Date of Birth: 09-01-1966  Today's Date: 02/12/2017 OT Individual Time: 1003-1100 and 1400-1430  OT Individual Time Calculation (min): 57 min and 30 min   Short Term Goals: Week 2:  OT Short Term Goal 1 (Week 2): Pt will perform toilet transfer with max A of 1 in order to decrease level of assistance needed. OT Short Term Goal 2 (Week 2): Pt will maintain sitting balance with mod A or less for 3 minutes during functional task. OT Short Term Goal 3 (Week 2): Pt will sustain attention during bathing and dressing for 2 minutes with mod multimodal cues.  Skilled Therapeutic Interventions/Progress Updates: Skilled OT session completed with focus on ADL retraining at shower level. Pt verbalizing that he wanted to take a shower today (via interpretor). Pt transferred with Sara+ and 2 helpers to tub bench facing outwards into bathroom. He completed bathing while seated, able to follow 1 step commands 70% of time. He perseverated on washing his face, and ofte grabbed (and held onto) items with L UE in nonpurposeful manner. Pt resisting when OT pulls dirty wash cloth out of his hands. Pt continent of bladder/incontinent of bowels while on bench. After bathing, pt completed squat pivot transfer to standard w/c with 2 OT helpers. Pt able to orient shirt without cues and don with supervision today! Worked on having him initiate and terminate LB dressing tasks. With extra time, pt able to thread 1 leg into pants and place socks onto feet partway. Unable to terminate these tasks.    Pt unoriented to time, place, and year  . Instructional cues for interpreting meanings on digital clock. PT handoff at end of tx.   2nd Session 1:1 tx (30 min) Pt received via SLP handoff. Pt unable to recall what he did in previous tx. Had him engage in dynavision task w/c level, focusing on trunk control, bilateral UE coordination,  attention, and initiation. Pt refused to use L UE, resisted therapist when attempting gentle HOH guidance. Pt frequently pressing unlit buttons, reporting that they were "red." He continued to report that he was "changing channels on the television," after therapist had told him several times that it was not a TV. Pt could attend to task for 2 correct button presses without sitting back in w/c and disengaging. At end of session pt was escorted back to room. Squat pivot<enclosure bed completed with 2 helpers. He was repositioned for comfort and left with all needs at time of departure.   Therapy Documentation Precautions:  Precautions Precautions: Fall, Other (comment) Precaution Comments: bone flap removed R side of head in abdomen; hemet for OOB; Pt no longer with NG or PICC; enclosure bed Required Braces or Orthoses: Other Brace/Splint Other Brace/Splint: helmet for OOB Restrictions Weight Bearing Restrictions: No  Pain: Pain Assessment Pain Assessment: No/denies pain ADL:      See Function Navigator for Current Functional Status.   Therapy/Group: Individual Therapy  Vina Byrd A Zoua Caporaso 02/12/2017, 12:44 PM

## 2017-02-12 NOTE — Progress Notes (Signed)
Speech Language Pathology Daily Session Note  Patient Details  Name: [name] MRN: [id number] Date of Birth: 1966-07-31  Today's Date: 02/12/2017 SLP Individual Time: 1300-1400 SLP Individual Time Calculation (min): 60 min  Short Term Goals: Week 2: SLP Short Term Goal 1 (Week 2): Patient will consume current diet with minimal overt s/s of aspiration with Mod A verbal cues for use of swallowing compensatory strategies.  SLP Short Term Goal 2 (Week 2): Patient will demonstrate sustained attention to a functional task for ~2 minutes with Mod A verbal cues for redirection.  SLP Short Term Goal 3 (Week 2): Patient will orient to place, situation and time with Mod A multimodal cues.  SLP Short Term Goal 4 (Week 2): Patient will verbally respond to questions in 50% of opportunities with Max A multimodal cues.  SLP Short Term Goal 5 (Week 2): Patient will demonstrate basic problem solving for basic tasks with Max A verbal cues.   Skilled Therapeutic Interventions: Skilled treatment session focused on dysphagia and cognitive goals. SLP facilitated session by providing Max-Total A for small bites/sips and a slow rate of self-feeding during lunch meal of Dys. 1 textures with thin liquids.  Patient also independently utilized a liquid wash to clear oral residue and left buccal pocketing. Patient consumed meal without overt s/s of aspiration. Recommend patient continue current diet.  Patient also required total A for orientation to place, time and situation, therefore, external aids were initiated to maximize recall and carryover of information. Patient handed off to OT. Continue with current plan of care.      Function:  Eating Eating   Modified Consistency Diet: Yes Eating Assist Level: Helper checks for pocketed food;Supervision or verbal cues;More than reasonable amount of time   Eating Set Up Assist For: Opening containers       Cognition Comprehension Comprehension assist  level: Understands basic 25 - 49% of the time/ requires cueing 50 - 75% of the time  Expression   Expression assist level: Expresses basic 25 - 49% of the time/requires cueing 50 - 75% of the time. Uses single words/gestures.  Social Interaction Social Interaction assist level: Interacts appropriately less than 25% of the time. May be withdrawn or combative.  Problem Solving Problem solving assist level: Solves basic less than 25% of the time - needs direction nearly all the time or does not effectively solve problems and may need a restraint for safety  Memory Memory assist level: Recognizes or recalls less than 25% of the time/requires cueing greater than 75% of the time    Pain Pain Assessment Pain Assessment: No/denies pain  Therapy/Group: Individual Therapy  Aison Malveaux 02/12/2017, 3:34 PM

## 2017-02-12 NOTE — Progress Notes (Signed)
[facility] PHYSICAL MEDICINE & REHABILITATION     PROGRESS NOTE    Subjective/Complaints: Up with PT. Sitting eob. Sleep chart indicates he slept fairly well. Pt denies pain or problems  ROS: Limited due cognitive/behavioral     Objective: Vital Signs: Blood pressure 117/83, pulse (!) 104, temperature 98.7 F (37.1 C), temperature source Axillary, resp. rate 19, height  (1.575 m), weight 60.5 kg (133 lb 6.1 oz), SpO2 98 %. No results found. No results for input(s): WBC, HGB, HCT, PLT in the last 72 hours. No results for input(s): NA, K, CL, GLUCOSE, BUN, CREATININE, CALCIUM in the last 72 hours.  Invalid input(s): CO CBG (last 3)   Recent Labs  02/11/17 1631 02/11/17 2212 02/12/17 0640  GLUCAP 118* 170* 169*    Wt Readings from Last 3 Encounters:  02/10/17 60.5 kg (133 lb 6.1 oz)  02/01/17 65.6 kg (144 lb 9.6 oz)  07/31/16 68 kg (150 lb)    Physical Exam:    Gen: NAD. Well developed.  HENT: Craniotomy site clean and dry. No breakdown Cardiovascular: RRR Respiratory:  CTA B GI: Soft. Bowel sounds are normal. Neurological: He is alert.  Patient remains distracted. Perseverates on rubbing/scratching head   Delayed initiaion with the left arm and leg. Left inattention  Psychiatric:   flat Skin. Warm and dry. Intact.   Assessment/Plan: 1. Cognitive and functional deficits secondary to right traumatic ICH/SDH s/p craniectomy which require 3+ hours per day of interdisciplinary therapy in a comprehensive inpatient rehab setting. Physiatrist is providing close team supervision and 24 hour management of active medical problems listed below. Physiatrist and rehab team continue to assess barriers to discharge/monitor patient progress toward functional and medical goals.  Function:  Bathing Bathing position   Position: Wheelchair/chair at sink  Bathing parts Body parts bathed by patient: Right upper leg, Left upper leg, Abdomen, Chest, Left arm Body parts bathed  by helper: Right arm, Right lower leg, Left lower leg, Back  Bathing assist Assist Level: 2 helpers      Upper Body Dressing/Undressing Upper body dressing   What is the patient wearing?: Pull over shirt/dress     Pull over shirt/dress - Perfomed by patient: Thread/unthread right sleeve, Thread/unthread left sleeve, Put head through opening Pull over shirt/dress - Perfomed by helper: Pull shirt over trunk        Upper body assist Assist Level:  (totalA sitting balance during task)      Lower Body Dressing/Undressing Lower body dressing   What is the patient wearing?: Pants       Pants- Performed by helper: Thread/unthread right pants leg, Thread/unthread left pants leg, Pull pants up/down   Non-skid slipper socks- Performed by helper: Don/doff right sock, Don/doff left sock               TED Hose - Performed by helper: Don/doff right TED hose, Don/doff left TED hose  Lower body assist Assist for lower body dressing:  (totalA)      Toileting Toileting Toileting activity did not occur: Safety/medical concerns   Toileting steps completed by helper: Adjust clothing prior to toileting, Performs perineal hygiene, Adjust clothing after toileting    Toileting assist Assist level: Two helpers   Transfers Chair/bed transfer   Chair/bed transfer method: Squat pivot Chair/bed transfer assist level: 2 helpers Chair/bed transfer assistive device: Armrests     Locomotion Ambulation Ambulation activity did not occur: Safety/medical concerns   Max distance: 30 ft Assist level: 2 helpers   Wheelchair  Wheelchair activity did not occur: Safety/medical concerns   Max wheelchair distance: 75 Assist Level: Maximal assistance (Pt 25 - 49%)  Cognition Comprehension Comprehension assist level: Understands basic 25 - 49% of the time/ requires cueing 50 - 75% of the time  Expression Expression assist level: Expresses basic 25 - 49% of the time/requires cueing 50 - 75% of the time.  Uses single words/gestures.  Social Interaction Social Interaction assist level: Interacts appropriately less than 25% of the time. May be withdrawn or combative.  Problem Solving Problem solving assist level: Solves basic problems with no assist, Solves basic less than 25% of the time - needs direction nearly all the time or does not effectively solve problems and may need a restraint for safety  Memory Memory assist level: Recognizes or recalls less than 25% of the time/requires cueing greater than 75% of the time  Medical Problem List and Plan: 1. 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 DVT Prophylaxis/Anticoagulation: Subcutaneous heparin. Monitor platelet counts and any signs of bleeding. 3. Pain Management: Hycet  4. Mood: Ativan as needed 5. Neuropsych: This patient is notcapable of making decisions on hisown behalf.  -sleep chart--improved patterns overall  -seroquel for sleep/agitation  -continue seroquel during the day for agitation   -limit benzos  -continue ritalin for arousal and initiation--increased to  on 4/12  -enclosure bed for safety  -soft helmet for safety 6. Skin/Wound Care: Routine skin checks  -helmet for cranium protection  -flap site  Intact  -crani site is clean without breakdown at the moment 7. Fluids/Electrolytes/Nutrition: eating very well so far  -check labs monday 8.Seizure prophylaxis. Keppra 750 mg twice daily 9.Pneumonia. Five-day course of Ancef  completed 01/22/2017 10.Dysphagia. On D1 thins. Taking meds without issues   11.Urinary retention/Escherichia coli UTI. Held Urecholine.Completed course of Cipro.   -condom cath  -timed voids eventually 12.Diabetes mellitus with peripheral neuropathy. Hemoglobin A1c 10.4.  Patient on  Glucophage 500 mg twice a day and Glucotrol 5 mg daily at home  -resume glucotrol  -lantus currently at 15u qhs  -sugars climbing a bit now that on diet 13.History of hypertension. Patient on lisinopril 2.5 mg daily prior to admission. Resume as needed  Under control 14.Constipation. Laxative assistance  LOS (Days) 11 A FACE TO FACE EVALUATION WAS PERFORMED  [name], MD 02/12/2017 8:49 AM

## 2017-02-12 NOTE — Progress Notes (Signed)
Physical Therapy Session Note  Patient Details  Name: [name] MRN: [id number] Date of Birth: 1966/03/10  Today's Date: 02/12/2017 PT Individual Time: 0801-0901 PT Individual Time Calculation (min): 60 min   Short Term Goals:  Week 2:  PT Short Term Goal 1 (Week 2): Pt will consistently participate in supine<>sitting with max assist +1. PT Short Term Goal 2 (Week 2): Pt will stand with max assist +1. PT Short Term Goal 3 (Week 2): Pt will consistently participate in bed<>w/c transfers with max assist +1.   Skilled Therapeutic Interventions/Progress Updates:   Pt received supine in enclosure bed. Pt noted to have had incontinent bladder movement. PT assist pt in doffing brief with log roll technique and min assist to roll R and L. Increased time to initiate movement. Sit<>supine with mod assist from PT with PT to initiate movement. Squat pivot transfer to [facility] with max assist from PT and increased time to initiate movement. Pt transported to rehab gym. In standing frame pt completed object identification and sorting task x 8 minutes with mod assist for UE placement. Pt able to identify objects with 100% accuracy and sort into proper groups with 2 fields. Accuracy for object identification and proper grouping decreased to 50%-75% with 3 fields. initiate kicking soccer ball towards target with R LE 75% of the time and 25% of the time on the L. Throughout treatment Pt required max cues and assist for proper positioning in WC with up to 1 minute to initiate trunk movement. Patient returned too room and left sitting in TIS WC with call bell in reach and all needs met.          Therapy Documentation Precautions:  Precautions Precautions: Fall, Other (comment) Precaution Comments: bone flap removed R side of head in abdomen; hemet for OOB; Pt no longer with NG or PICC; enclosure bed Required Braces or Orthoses: Other Brace/Splint Other Brace/Splint: helmet for  OOB Restrictions Weight Bearing Restrictions: No    Vital Signs: Therapy Vitals Temp: 98.7 F (37.1 C) Temp Source: Axillary Pulse Rate: (!) 104 Resp: 19 BP: 117/83 Patient Position (if appropriate): Lying Oxygen Therapy SpO2: 98 % O2 Device: Not Delivered   See Function Navigator for Current Functional Status.   Therapy/Group: Individual Therapy  Lorie Phenix 02/12/2017, 9:05 AM

## 2017-02-12 NOTE — Progress Notes (Signed)
Physical Therapy Session Note  Patient Details  Name: [name] MRN: [id number] Date of Birth: 1966-07-01  Today's Date: 02/12/2017 PT Individual Time: 1100-1155 PT Individual Time Calculation (min): 55 min   Short Term Goals: Week 2:  PT Short Term Goal 1 (Week 2): Pt will consistently participate in supine<>sitting with max assist +1. PT Short Term Goal 2 (Week 2): Pt will stand with max assist +1. PT Short Term Goal 3 (Week 2): Pt will consistently participate in bed<>w/c transfers with max assist +1.   Skilled Therapeutic Interventions/Progress Updates:   Patient in wheelchair with sister and translator present for session, handoff from OT. Patient propelled wheelchair to gym using RUE x 150 ft with min-mod A for steering and turning and total A to terminate task by removing RUE from wheel and placing in lap. Patient able to lock/unlock brakes with max multimodal cues for R side using RUE and hand over hand assist for L side with LUE or reaching across body with RUE with supervision. Gait training for neuromuscular re-ed using Huntley Dec Plus to facilitate upright posture, weight shifting, and reciprocal gait pattern with rehab tech steering Huntley Dec, therapist facilitating weight shifts, and third person assisting with advancing RLE with variable assist min-total A and to facilitate increased R step length. With adequate weight shifting patient able to advance LLE 100% of the time with mod faded to min verbal/tactile cues for sequencing. Gait with Huntley Dec Plus x 60 ft, 101 ft, and 45 ft with trials terminated as patient fatigued and required increased physical assistance. In standing using Huntley Dec Plus, patient able to take small steps with RLE without assist 3 times before unable to advance LE independently. Patient smiling and laughing and very motivated to continue ambulation, repeatedly stating he was hungry and wanted to walk back to room for lunch. Patient propelled wheelchair back to room  using bilateral UEs with intermittent hand over hand assist to locate wheel with LUE and excellent initiation of task. Patient able to propel in straight path for periods of supervision before requiring min A to recorrect and max-total A to turn to R with patient resisting and attempting to turn L. Patient able to answer questions for path finding back to room correctly. Patient requested to rest in bed while waiting for lunch meal. Performed stand pivot transfer from wheelchair to bed with max-total A x 1, able to lift RLE into bed but requiring total A to lift LLE and reposition in bed. Patient left in enclosure bed with sister in room.   Therapy Documentation Precautions:  Precautions Precautions: Fall, Other (comment) Precaution Comments: bone flap removed R side of head in abdomen; hemet for OOB; Pt no longer with NG or PICC; enclosure bed Required Braces or Orthoses: Other Brace/Splint Other Brace/Splint: helmet for OOB Restrictions Weight Bearing Restrictions: No Pain: Pain Assessment Pain Assessment: No/denies pain   See Function Navigator for Current Functional Status.   Therapy/Group: Individual Therapy  [name], [name] 02/12/2017, 12:16 PM

## 2017-02-13 ENCOUNTER — Inpatient Hospital Stay (HOSPITAL_COMMUNITY): Payer: Self-pay | Admitting: Occupational Therapy

## 2017-02-13 LAB — GLUCOSE, CAPILLARY
GLUCOSE-CAPILLARY: 130 mg/dL — AB (ref 65–99)
GLUCOSE-CAPILLARY: 116 mg/dL — AB (ref 65–99)
Glucose-Capillary: 89 mg/dL (ref 65–99)
Glucose-Capillary: 143 mg/dL — ABNORMAL HIGH (ref 65–99)

## 2017-02-13 NOTE — Progress Notes (Signed)
Pt had no void throughout shift. Bladder scan 700cc. Pt states, "I need to pee". Attempted with no result. I&O cath 800cc at 1815. Pt tolerated I&O cath well. Pt states, "better. Thank you." RN will continue to monitor urine output.

## 2017-02-13 NOTE — Progress Notes (Signed)
[place] PHYSICAL MEDICINE & REHABILITATION     PROGRESS NOTE    Subjective/Complaints: Had a fair night. Tolerating the enclosure bed. Walked yesterday with therapy. Sister pleased  ROS: Limited due cognitive/behavioral     Objective: Vital Signs: Blood pressure 118/78, pulse (!) 103, temperature 98.3 F (36.8 C), temperature source Oral, resp. rate 18, height  (1.575 m), weight 60.5 kg (133 lb 6.1 oz), SpO2 95 %. No results found. No results for input(s): WBC, HGB, HCT, PLT in the last 72 hours. No results for input(s): NA, K, CL, GLUCOSE, BUN, CREATININE, CALCIUM in the last 72 hours.  Invalid input(s): CO CBG (last 3)   Recent Labs  02/12/17 1636 02/12/17 2054 02/13/17 0625  GLUCAP 102* 140* 130*    Wt Readings from Last 3 Encounters:  02/10/17 60.5 kg (133 lb 6.1 oz)  02/01/17 65.6 kg (144 lb 9.6 oz)  07/31/16 68 kg (150 lb)    Physical Exam:    Gen: NAD. Well developed.  HENT: Craniotomy site clean and dry with scab, no drainage. No breakdown Cardiovascular:  RRR Respiratory:  CTA B GI: Soft. Bowel sounds are normal. Neurological: He is alert.  Distracted with delayed initiaion with the left arm and leg. Left inattention  Psychiatric:   flat Skin. Warm and dry. Intact.   Assessment/Plan: 1. Cognitive and functional deficits secondary to right traumatic ICH/SDH s/p craniectomy which require 3+ hours per day of interdisciplinary therapy in a comprehensive inpatient rehab setting. Physiatrist is providing close team supervision and 24 hour management of active medical problems listed below. Physiatrist and rehab team continue to assess barriers to discharge/monitor patient progress toward functional and medical goals.  Function:  Bathing Bathing position   Position: Shower  Bathing parts Body parts bathed by patient: Right upper leg, Left upper leg, Abdomen, Chest, Left arm Body parts bathed by helper: Right arm, Front perineal area, Buttocks,  Right lower leg, Left lower leg, Back  Bathing assist Assist Level: 2 helpers      Upper Body Dressing/Undressing Upper body dressing   What is the patient wearing?: Pull over shirt/dress     Pull over shirt/dress - Perfomed by patient: Thread/unthread right sleeve, Thread/unthread left sleeve, Put head through opening, Pull shirt over trunk Pull over shirt/dress - Perfomed by helper: Pull shirt over trunk        Upper body assist Assist Level: Supervision or verbal cues      Lower Body Dressing/Undressing Lower body dressing   What is the patient wearing?: Pants, Non-skid slipper socks       Pants- Performed by helper: Thread/unthread right pants leg, Thread/unthread left pants leg, Pull pants up/down   Non-skid slipper socks- Performed by helper: Don/doff right sock, Don/doff left sock               TED Hose - Performed by helper: Don/doff right TED hose, Don/doff left TED hose  Lower body assist Assist for lower body dressing: 2 Helpers      Toileting Toileting Toileting activity did not occur: Safety/medical concerns   Toileting steps completed by helper: Adjust clothing prior to toileting, Performs perineal hygiene, Adjust clothing after toileting Toileting Assistive Devices: Other (comment) (sara)  Toileting assist Assist level: Two helpers   Transfers Chair/bed transfer   Chair/bed transfer method: Stand pivot Chair/bed transfer assist level: 2 helpers Chair/bed transfer assistive device: Armrests     Locomotion Ambulation Ambulation activity did not occur: Safety/medical concerns   Max distance: 101 ft Assist  level: 2 helpers   Education administrator activity did not occur: Safety/medical concerns   Max wheelchair distance: 150 ft Assist Level: Maximal assistance (Pt 25 - 49%)  Cognition Comprehension Comprehension assist level: Understands basic 25 - 49% of the time/ requires cueing 50 - 75% of the time  Expression Expression assist level:  Expresses basic 50 - 74% of the time/requires cueing 25 - 49% of the time. Needs to repeat parts of sentences.  Social Interaction Social Interaction assist level: Interacts appropriately 50 - 74% of the time - May be physically or verbally inappropriate.  Problem Solving Problem solving assist level: Solves basic 50 - 74% of the time/requires cueing 25 - 49% of the time  Memory Memory assist level: Recognizes or recalls 50 - 74% of the time/requires cueing 25 - 49% of the time  Medical Problem List and Plan: 1. 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 DVT Prophylaxis/Anticoagulation: Subcutaneous heparin. Monitor platelet counts and any signs of bleeding. 3. Pain Management: Hycet  4. Mood: Ativan as needed 5. Neuropsych: This patient is notcapable of making decisions on hisown behalf.  - improved patterns overall  -seroquel for sleep/agitation  -continue seroquel during the day for agitation   -limit benzos  -continue ritalin for arousal and initiation--increased to  on 4/12  -enclosure bed for safety  -soft helmet for safety 6. Skin/Wound Care: Routine skin checks  -helmet for cranium protection  -flap site  Intact  -crani site is clean without breakdown at the moment 7. Fluids/Electrolytes/Nutrition: intake a little sporadic. Will need cueing for better intake  -check labs monday 8.Seizure prophylaxis. Keppra 750 mg twice daily 9.Pneumonia. Five-day course of Ancef  completed 01/22/2017 10.Dysphagia. On D1 thins. Taking meds without issues   11.Urinary retention/Escherichia coli UTI. Held Urecholine.Completed course of Cipro.   -condom cath  -timed voids eventually 12.Diabetes mellitus with peripheral neuropathy. Hemoglobin A1c 10.4.  Patient on Glucophage  500 mg twice a day and Glucotrol 5 mg daily at home  -resumed glucotrol as sugars rising  -lantus currently at 15u qhs  -follow for pattern 13.History of hypertension. Patient on lisinopril 2.5 mg daily prior to admission. Resume as needed  Under control 14.Constipation. Laxative assistance  LOS (Days) 12 A FACE TO FACE EVALUATION WAS PERFORMED  [name], MD 02/13/2017 8:42 AM

## 2017-02-13 NOTE — Progress Notes (Signed)
Occupational Therapy Session Note  Patient Details  Name: [name] MRN: [id number] Date of Birth: Aug 22, 1966  Today's Date: 02/13/2017 OT Individual Time: 4098-1191 OT Individual Time Calculation (min): 59 min    Skilled Therapeutic Interventions/Progress Updates: Skilled OT session completed with focus on cognitive skills, functional transfers, sit<stand and ADL retraining. Pt was lying in enclosure bed with family and interpretor present at time of arrival. With 1 cue to initiate, pt able to don right sock while supine. Assist for left sock due to pts problem solving/attention deficits. Stand pivot transfer to w/c completed with Max A 1 helper and additional helper at standby. He verbalized desire to get dressed, and had him complete this w/c level at sink. When given preferred deoderant (different from the hospital kind he's been using) pt attempts to eat it. Max cues and HOH assist for correct use of deoderant. Pt able to orient shirt without cues, then helmet was donned. When given pants, pt able to initiate threading L and R leg (therapist positioned left leg in figure 4 for safety). 2 helpers 3 muskateers for sit<stand and lifting pants over hips. Afterwards had pt try to void for encouraging continence. Stand pivot<drop arm commode over toilet completed with 2 helpers and grab bars. Pt becoming agitated, requesting for therapist and tech to leave in order to void. [name], sister, agreed to sit with pt for increased comfort while voiding. With extra time, pt was unable to void. Sit<stand for clothing mgt with Max A 1 helper while additional helper managed clothing. He was transferred back to w/c. Safety belt was donned and pt was reclined in TIS. HE was left with family at time of departure.        Therapy Documentation Precautions:  Precautions Precautions: Fall, Other (comment) Precaution Comments: bone flap removed R side of head in abdomen; hemet for OOB; Pt no longer  with NG or PICC; enclosure bed Required Braces or Orthoses: Other Brace/Splint Other Brace/Splint: helmet for OOB Restrictions Weight Bearing Restrictions: No  Pain: No c/o pain during session    ADL:  :    See Function Navigator for Current Functional Status.   Therapy/Group: Individual Therapy  Dail Lerew A Austyn Perriello 02/13/2017, 12:45 PM

## 2017-02-14 ENCOUNTER — Inpatient Hospital Stay (HOSPITAL_COMMUNITY): Payer: Self-pay | Admitting: Physical Therapy

## 2017-02-14 DIAGNOSIS — S062X3S Diffuse traumatic brain injury with loss of consciousness of 1 hour to 5 hours 59 minutes, sequela: Secondary | ICD-10-CM

## 2017-02-14 LAB — GLUCOSE, CAPILLARY
GLUCOSE-CAPILLARY: 122 mg/dL — AB (ref 65–99)
Glucose-Capillary: 123 mg/dL — ABNORMAL HIGH (ref 65–99)
Glucose-Capillary: 115 mg/dL — ABNORMAL HIGH (ref 65–99)
Glucose-Capillary: 116 mg/dL — ABNORMAL HIGH (ref 65–99)

## 2017-02-14 MED ORDER — BETHANECHOL CHLORIDE 10 MG PO TABS
10.0000 mg | ORAL_TABLET | Freq: Three times a day (TID) | ORAL | Status: DC
  Administered 2017-02-14 – 2017-02-21 (×23): 10 mg via ORAL
  Filled 2017-02-14 (×24): qty 1

## 2017-02-14 NOTE — Progress Notes (Signed)
[facility] PHYSICAL MEDICINE & REHABILITATION     PROGRESS NOTE    Subjective/Complaints: Urine retention documented. Pt more appropriately interacting with staff  ROS: Limited due cognitive/behavioral      Objective: Vital Signs: Blood pressure 110/73, pulse 84, temperature 98.3 F (36.8 C), resp. rate 18, height  (1.575 m), weight 60.5 kg (133 lb 6.1 oz), SpO2 94 %. No results found. No results for input(s): WBC, HGB, HCT, PLT in the last 72 hours. No results for input(s): NA, K, CL, GLUCOSE, BUN, CREATININE, CALCIUM in the last 72 hours.  Invalid input(s): CO CBG (last 3)   Recent Labs  02/13/17 1657 02/13/17 2147 02/14/17 0634  GLUCAP 143* 116* 123*    Wt Readings from Last 3 Encounters:  02/10/17 60.5 kg (133 lb 6.1 oz)  02/01/17 65.6 kg (144 lb 9.6 oz)  07/31/16 68 kg (150 lb)    Physical Exam:    Gen: NAD. Well developed.  HENT: Craniotomy site clean and dry with scab, no drainage. No breakdown Cardiovascular:  RRR Respiratory:  CTA B GI: Soft. Bowel sounds are normal. Neurological: He is alert.  Slow initiation.  Left inattention  Psychiatric:   flat Skin. Warm and dry. Intact.   Assessment/Plan: 1. Cognitive and functional deficits secondary to right traumatic ICH/SDH s/p craniectomy which require 3+ hours per day of interdisciplinary therapy in a comprehensive inpatient rehab setting. Physiatrist is providing close team supervision and 24 hour management of active medical problems listed below. Physiatrist and rehab team continue to assess barriers to discharge/monitor patient progress toward functional and medical goals.  Function:  Bathing Bathing position   Position: Shower  Bathing parts Body parts bathed by patient: Right upper leg, Left upper leg, Abdomen, Chest, Left arm Body parts bathed by helper: Right arm, Front perineal area, Buttocks, Right lower leg, Left lower leg, Back  Bathing assist Assist Level: 2 helpers      Upper  Body Dressing/Undressing Upper body dressing   What is the patient wearing?: Pull over shirt/dress     Pull over shirt/dress - Perfomed by patient: Thread/unthread right sleeve, Thread/unthread left sleeve, Put head through opening, Pull shirt over trunk Pull over shirt/dress - Perfomed by helper: Pull shirt over trunk        Upper body assist Assist Level: Supervision or verbal cues      Lower Body Dressing/Undressing Lower body dressing   What is the patient wearing?: Pants, Non-skid slipper socks     Pants- Performed by patient: Thread/unthread right pants leg Pants- Performed by helper: Thread/unthread left pants leg, Pull pants up/down Non-skid slipper socks- Performed by patient: Don/doff right sock Non-skid slipper socks- Performed by helper: Don/doff left sock               TED Hose - Performed by helper: Don/doff right TED hose, Don/doff left TED hose  Lower body assist Assist for lower body dressing: 2 Helpers      Toileting Toileting Toileting activity did not occur: Safety/medical concerns   Toileting steps completed by helper: Adjust clothing prior to toileting, Adjust clothing after toileting Toileting Assistive Devices: Other (comment) (sara)  Toileting assist Assist level: Two helpers   Transfers Chair/bed transfer   Chair/bed transfer method: Stand pivot Chair/bed transfer assist level: 2 helpers Chair/bed transfer assistive device: Armrests     Locomotion Ambulation Ambulation activity did not occur: Safety/medical concerns   Max distance: 101 ft Assist level: 2 helpers   Education administrator activity did not occur: Safety/medical  concerns   Max wheelchair distance: 150 ft Assist Level: Maximal assistance (Pt 25 - 49%)  Cognition Comprehension Comprehension assist level: Understands basic 25 - 49% of the time/ requires cueing 50 - 75% of the time  Expression Expression assist level: Expresses basic 50 - 74% of the time/requires cueing 25 -  49% of the time. Needs to repeat parts of sentences.  Social Interaction Social Interaction assist level: Interacts appropriately 50 - 74% of the time - May be physically or verbally inappropriate.  Problem Solving Problem solving assist level: Solves basic 50 - 74% of the time/requires cueing 25 - 49% of the time  Memory Memory assist level: Recognizes or recalls 50 - 74% of the time/requires cueing 25 - 49% of the time  Medical Problem List and Plan: 1. 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 DVT Prophylaxis/Anticoagulation: Subcutaneous heparin. Monitor platelet counts and any signs of bleeding. 3. Pain Management: Hycet  4. Mood: Ativan as needed 5. Neuropsych: This patient is notcapable of making decisions on hisown behalf.  - improved patterns overall  -seroquel for sleep/agitation  -continue seroquel during the day for agitation   -limit benzos  -continue ritalin for arousal and initiation--increased to  on 4/12  -enclosure bed for safety still required  -soft helmet for safety 6. Skin/Wound Care: Routine skin checks  -helmet for cranium protection  -flap site  Intact  -crani site is clean without breakdown at the moment 7. Fluids/Electrolytes/Nutrition: intake a little sporadic. Will need cueing for better intake  -check labs monday 8.Seizure prophylaxis. Keppra 750 mg twice daily 9.Pneumonia. Five-day course of Ancef  completed 01/22/2017 10.Dysphagia. On D1 thins. Taking meds without issues   11.Urinary retention/Escherichia coli UTI.  Completed course of Cipro.   -condom cath  -timed voids    -resume urecholine   -recheck ucx 12.Diabetes mellitus with peripheral neuropathy. Hemoglobin A1c 10.4.  Patient on Glucophage 500 mg twice a day and Glucotrol 5  mg daily at home  -resumed glucotrol as sugars rising  -lantus currently at 15u qhs  -some improvement 13.History of hypertension. Patient on lisinopril 2.5 mg daily prior to admission. Resume as needed  Under control 14.Constipation. Laxative assistance   LOS (Days) 13 A FACE TO FACE EVALUATION WAS PERFORMED  [name], MD 02/14/2017 9:23 AM

## 2017-02-14 NOTE — Progress Notes (Signed)
Physical Therapy Note  Patient Details  Name: [name] MRN: [id number] Date of Birth: [birth year] Today's Date: 02/14/2017    Time: [phone] 57 minutes  1:1 No c/o pain. Pt getting up with sara to use restroom on PT arrival.  Pt able to stand with sara after toileting, total A for clothing management.  Attempt w/c mobility with UEs and then LEs, pt states "it is too much", pt resisting hand over hand assist for w/c mobility.  Standing frame with matching task for numbers and colors with 100% accuracy, mod/max cues for attention to task.  Pt asks to attempt standing without strap.  Pt stands with +2 max A, max A for standing balance at standing frame as pt with posterior lean. Pt able to stand x 1 minute without strap.  Sitting balance task with ball bounce and kicking ball with mod A for sitting balance.  Pt keeps attempting to lay down, requires max encouragement to continue to sit up. Pt with 1 rest in supine requiring +2 assist to return to sitting.  Pt +2 assist to transfer to enclosure bed at end of session with family present.   Theresia Pree 02/14/2017, 10:45 AM

## 2017-02-15 ENCOUNTER — Inpatient Hospital Stay (HOSPITAL_COMMUNITY): Payer: Self-pay | Admitting: Speech Pathology

## 2017-02-15 ENCOUNTER — Inpatient Hospital Stay (HOSPITAL_COMMUNITY): Payer: Self-pay | Admitting: Occupational Therapy

## 2017-02-15 ENCOUNTER — Inpatient Hospital Stay (HOSPITAL_COMMUNITY): Payer: Self-pay | Admitting: Physical Therapy

## 2017-02-15 LAB — CBC
HCT: 35 % — ABNORMAL LOW (ref 39.0–52.0)
Hemoglobin: 11.7 g/dL — ABNORMAL LOW (ref 13.0–17.0)
MCH: 28.7 pg (ref 26.0–34.0)
MCHC: 33.4 g/dL (ref 30.0–36.0)
MCV: 86 fL (ref 78.0–100.0)
Platelets: 288 10*3/uL (ref 150–400)
RBC: 4.07 MIL/uL — ABNORMAL LOW (ref 4.22–5.81)
RDW: 13.8 % (ref 11.5–15.5)
WBC: 4.6 10*3/uL (ref 4.0–10.5)

## 2017-02-15 LAB — BASIC METABOLIC PANEL
Anion gap: 11 (ref 5–15)
BUN: 10 mg/dL (ref 6–20)
CALCIUM: 9 mg/dL (ref 8.9–10.3)
CO2: 25 mmol/L (ref 22–32)
CREATININE: 0.62 mg/dL (ref 0.61–1.24)
Chloride: 105 mmol/L (ref 101–111)
GFR calc Af Amer: >60 mL/min (ref >60)
GLUCOSE: 123 mg/dL — AB (ref 65–99)
Potassium: 3.9 mmol/L (ref 3.5–5.1)
Sodium: 141 mmol/L (ref 135–145)

## 2017-02-15 LAB — URINE CULTURE: CULTURE: NO GROWTH

## 2017-02-15 LAB — GLUCOSE, CAPILLARY
GLUCOSE-CAPILLARY: 125 mg/dL — AB (ref 65–99)
Glucose-Capillary: 99 mg/dL (ref 65–99)
Glucose-Capillary: 175 mg/dL — ABNORMAL HIGH (ref 65–99)
Glucose-Capillary: 160 mg/dL — ABNORMAL HIGH (ref 65–99)

## 2017-02-15 NOTE — Progress Notes (Signed)
Physical Therapy Weekly Progress Note  Patient Details  Name: [name] MRN: [id number] Date of Birth: 1966-03-06  Beginning of progress report period: February 09, 2017 End of progress report period: February 16, 2017  Today's Date: 02/16/2017   Patient has met 0 of 3 short term goals.  Pt is making very slow progress towards LTG's as pt continues to be limited by impaired cognition and appears to be very fearful of falling during all functional mobility tasks. Gait training has been initiated with the use of the lite gait or sara plus but pt continues to require significant assistance to participate in task. Pt has very impaired initiation and requires max/total assist to initiate tasks. Pt would benefit from continued skilled PT treatment to focus on transfers, bed mobility, cognitive remediation, NMR, and gait.  Patient continues to demonstrate the following deficits muscle weakness, decreased cardiorespiratoy endurance, impaired timing and sequencing and decreased coordination, decreased visual perceptual skills, decreased attention to left, decreased initiation, decreased attention, decreased awareness, decreased problem solving, decreased safety awareness, decreased memory and delayed processing, and decreased sitting balance, decreased standing balance, decreased postural control, hemiplegia, decreased balance strategies and difficulty maintaining precautions and therefore will continue to benefit from skilled PT intervention to increase functional independence with mobility.  Patient is slowly progressing toward long term goals..  Continue plan of care.  PT Short Term Goals Week 2:  PT Short Term Goal 1 (Week 2): Pt will consistently participate in supine<>sitting with max assist +1. PT Short Term Goal 1 - Progress (Week 2): Not met PT Short Term Goal 2 (Week 2): Pt will stand with max assist +1. PT Short Term Goal 2 - Progress (Week 2): Not met PT Short Term Goal 3 (Week 2): Pt  will consistently participate in bed<>w/c transfers with max assist +1. PT Short Term Goal 3 - Progress (Week 2): Progressing toward goal Week 3:  PT Short Term Goal 1 (Week 3): Pt will stand with max assist +1.  PT Short Term Goal 2 (Week 3): Pt will intiate all functional mobility tasks 50% of the time. PT Short Term Goal 3 (Week 3): Pt will ambulate 10 ft with max assist +1.   Therapy Documentation Precautions:  Precautions Precautions: Fall, Other (comment) Precaution Comments: bone flap removed R side of head in abdomen; hemet for OOB; Pt no longer with NG or PICC; enclosure bed Required Braces or Orthoses: Other Brace/Splint Other Brace/Splint: helmet for OOB Restrictions Weight Bearing Restrictions: No   See Function Navigator for Current Functional Status.   [name] 02/16/2017, 7:57 AM

## 2017-02-15 NOTE — Progress Notes (Signed)
Speech Language Pathology Daily Session Note  Patient Details  Name: [name] MRN: [id number] Date of Birth: April 16, 1966  Today's Date: 02/15/2017 SLP Individual Time: 1300-1400 SLP Individual Time Calculation (min): 60 min  Short Term Goals: Week 2: SLP Short Term Goal 1 (Week 2): Patient will consume current diet with minimal overt s/s of aspiration with Mod A verbal cues for use of swallowing compensatory strategies.  SLP Short Term Goal 2 (Week 2): Patient will demonstrate sustained attention to a functional task for ~2 minutes with Mod A verbal cues for redirection.  SLP Short Term Goal 3 (Week 2): Patient will orient to place, situation and time with Mod A multimodal cues.  SLP Short Term Goal 4 (Week 2): Patient will verbally respond to questions in 50% of opportunities with Max A multimodal cues.  SLP Short Term Goal 5 (Week 2): Patient will demonstrate basic problem solving for basic tasks with Max A verbal cues.   Skilled Therapeutic Interventions: Skilled treatment session focused on addressing dysphagia and cognition goals. SLP facilitated session by transferring patient out of bed, 2+ assist with Huntley Dec, for increased arousal for PO.  SLP facilitated session by reducing environmental distractions and options on tray as well as providing Max assist multimodal cues for portion control and pacing.  Removal of spoon paired with handing patient the cup of thin liquids was effective at assisting with oral clearance of Dys.1 textures with no overt s/s of aspiration.  For safety reasons cues unable to be faded during session.  After lunch patient reported needing to go to the bathroom, 2+ assist with Huntley Dec was utilized for transfer with Max assist multimodal cues to sequence and problem solve.  Patient with continent bowel and urination while on the commode.  Patient left upright in wheelchair with quick release belt and OT present.  Continue with current plan of care.     Function:  Eating Eating   Modified Consistency Diet: Yes Eating Assist Level: Helper checks for pocketed food;Supervision or verbal cues;Set up assist for;Help with picking up utensils;Help managing cup/glass   Eating Set Up Assist For: Opening containers;Cutting food       Cognition Comprehension Comprehension assist level: Understands basic less than 25% of the time/ requires cueing >75% of the time  Expression   Expression assist level: Expresses basic 50 - 74% of the time/requires cueing 25 - 49% of the time. Needs to repeat parts of sentences.  Social Interaction Social Interaction assist level: Interacts appropriately 25 - 49% of time - Needs frequent redirection.  Problem Solving Problem solving assist level: Solves basic 25 - 49% of the time - needs direction more than half the time to initiate, plan or complete simple activities  Memory Memory assist level: Recognizes or recalls 25 - 49% of the time/requires cueing 50 - 75% of the time    Pain Pain Assessment Pain Assessment: No/denies pain  Therapy/Group: Individual Therapy  [name]., CCC-SLP [phone]  [name] 02/15/2017, 8:48 PM

## 2017-02-15 NOTE — Progress Notes (Signed)
[facility] PHYSICAL MEDICINE & REHABILITATION     PROGRESS NOTE    Subjective/Complaints: Up working with therapies. Denies pain this morning. Still requiring I/O caths  ROS: Limited due cognitive/behavioral      Objective: Vital Signs: Blood pressure 98/60, pulse 68, temperature 98.5 F (36.9 C), temperature source Oral, resp. rate 20, height  (1.575 m), weight 60.5 kg (133 lb 6.1 oz), SpO2 96 %. No results found.  Recent Labs  02/15/17 0544  WBC 4.6  HGB 11.7*  HCT 35.0*  PLT 288    Recent Labs  02/15/17 0544  NA 141  K 3.9  CL 105  GLUCOSE 123*  BUN 10  CREATININE 0.62  CALCIUM 9.0   CBG (last 3)   Recent Labs  02/14/17 1624 02/14/17 2101 02/15/17 0632  GLUCAP 122* 116* 125*    Wt Readings from Last 3 Encounters:  02/10/17 60.5 kg (133 lb 6.1 oz)  02/01/17 65.6 kg (144 lb 9.6 oz)  07/31/16 68 kg (150 lb)    Physical Exam:    Gen: NAD. Well developed.  HENT: Craniotomy site clean and dry with scab, no drainage. No breakdown Cardiovascular:   RRR Respiratory:  CTA B GI: Soft. Bowel sounds are normal. Neurological: He is alert.  Slow initiation.  Left inattention  Psychiatric:   flat Skin. Warm and dry. Intact.   Assessment/Plan: 1. Cognitive and functional deficits secondary to right traumatic ICH/SDH s/p craniectomy which require 3+ hours per day of interdisciplinary therapy in a comprehensive inpatient rehab setting. Physiatrist is providing close team supervision and 24 hour management of active medical problems listed below. Physiatrist and rehab team continue to assess barriers to discharge/monitor patient progress toward functional and medical goals.  Function:  Bathing Bathing position   Position: Shower  Bathing parts Body parts bathed by patient: Right upper leg, Left upper leg, Abdomen, Chest, Left arm Body parts bathed by helper: Right arm, Front perineal area, Buttocks, Right lower leg, Left lower leg, Back  Bathing  assist Assist Level: 2 helpers      Upper Body Dressing/Undressing Upper body dressing   What is the patient wearing?: Pull over shirt/dress     Pull over shirt/dress - Perfomed by patient: Thread/unthread right sleeve, Thread/unthread left sleeve, Put head through opening, Pull shirt over trunk Pull over shirt/dress - Perfomed by helper: Pull shirt over trunk        Upper body assist Assist Level: Supervision or verbal cues      Lower Body Dressing/Undressing Lower body dressing   What is the patient wearing?: Pants, Non-skid slipper socks     Pants- Performed by patient: Thread/unthread right pants leg Pants- Performed by helper: Thread/unthread left pants leg, Pull pants up/down Non-skid slipper socks- Performed by patient: Don/doff right sock Non-skid slipper socks- Performed by helper: Don/doff left sock               TED Hose - Performed by helper: Don/doff right TED hose, Don/doff left TED hose  Lower body assist Assist for lower body dressing: 2 Helpers      Toileting Toileting Toileting activity did not occur: Safety/medical concerns   Toileting steps completed by helper: Adjust clothing prior to toileting, Adjust clothing after toileting Toileting Assistive Devices: Other (comment) (sara)  Toileting assist Assist level: Two helpers   Transfers Chair/bed transfer   Chair/bed transfer method: Stand pivot Chair/bed transfer assist level: 2 helpers Chair/bed transfer assistive device: Armrests     Locomotion Ambulation Ambulation activity did  not occur: Safety/medical concerns   Max distance: 101 ft Assist level: 2 helpers   Education administrator activity did not occur: Safety/medical concerns   Max wheelchair distance: 150 ft Assist Level: Maximal assistance (Pt 25 - 49%)  Cognition Comprehension Comprehension assist level: Understands basic 25 - 49% of the time/ requires cueing 50 - 75% of the time  Expression Expression assist level: Expresses  basic 50 - 74% of the time/requires cueing 25 - 49% of the time. Needs to repeat parts of sentences.  Social Interaction Social Interaction assist level: Interacts appropriately 50 - 74% of the time - May be physically or verbally inappropriate.  Problem Solving Problem solving assist level: Solves basic 50 - 74% of the time/requires cueing 25 - 49% of the time  Memory Memory assist level: Recognizes or recalls 50 - 74% of the time/requires cueing 25 - 49% of the time  Medical Problem List and Plan: 1. 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 DVT Prophylaxis/Anticoagulation: Subcutaneous heparin. Monitor platelet counts and any signs of bleeding. 3. Pain Management: Hycet  4. Mood: Ativan as needed 5. Neuropsych: This patient is notcapable of making decisions on hisown behalf.  - improved patterns overall  -seroquel for sleep/agitation  -continue seroquel during the day for agitation   -limiting benzos  -continue ritalin for arousal and initiation--increased to  on 4/12  -enclosure bed for safety still required--renewed  -soft helmet for safety 6. Skin/Wound Care: Routine skin checks  -helmet for cranium protection  -flap site  Intact  -crani site is clean without breakdown at the moment 7. Fluids/Electrolytes/Nutrition: intake a little sporadic. Will need cueing for better intake  -check labs monday 8.Seizure prophylaxis. Keppra 750 mg twice daily 9.Pneumonia. Five-day course of Ancef  completed 01/22/2017 10.Dysphagia. On D1 thins. Taking meds without issues   11.Urinary retention/Escherichia coli UTI.  Completed course of Cipro.   -condom cath  -timed voids up to toilet    -resumed urecholine at  TID.    - ucx pending 12.Diabetes mellitus with peripheral  neuropathy. Hemoglobin A1c 10.4.  Patient on Glucophage 500 mg twice a day and Glucotrol 5 mg daily at home  -resumed glucotrol as sugars rising  -lantus currently at 15u qhs  -  improvement 13.History of hypertension. Patient on lisinopril 2.5 mg daily prior to admission. Resume as needed  Under control 14.Constipation. Laxative assistance   LOS (Days) 14 A FACE TO FACE EVALUATION WAS PERFORMED  [name], MD 02/15/2017 9:11 AM

## 2017-02-15 NOTE — Progress Notes (Signed)
Occupational Therapy Session Note  Patient Details  Name: [name] MRN: [id number] Date of Birth: Mar 19, 1966  Today's Date: 02/15/2017 OT Individual Time: 1000-1043 and 1308-6578 OT Individual Time Calculation (min): 43 min and 28 min    Short Term Goals: Week 2:  OT Short Term Goal 1 (Week 2): Pt will perform toilet transfer with max A of 1 in order to decrease level of assistance needed. OT Short Term Goal 2 (Week 2): Pt will maintain sitting balance with mod A or less for 3 minutes during functional task. OT Short Term Goal 3 (Week 2): Pt will sustain attention during bathing and dressing for 2 minutes with mod multimodal cues.  Skilled Therapeutic Interventions/Progress Updates:    Session 1: Upon entering the room, pt supine in vale bed with no signs or symptoms of pain this session. Total A for supine >sit onto EOB. Pt sitting with mod - max A for sitting balance and therapist assisting him in removing his shirt. Pt attempting to push self back into bed and resistively pushing posteriorly. Pt laying onto back and able to pull B LE's back into bed with steady assistance. Pt agreeable to dressing this session. OT handing pt pull over shirt and he donned while laying in supine with mod verbal cues for initiation and sequence. Pt removing pants and brief from supine with max verbal cues. Pt threading R LE into LB clothing and partially pulling up but needing assistance with task. Pt given increased time and max cues for task. Use of orientation sheets printed on wall to orient pt to location, time, and situation. Pt unable to correctly answer when asked. Pt fatigued and laying on side away from therapist and therefore given time to rest. Vale bed secured and all needs within reach.   Session 2: Pt transitioning from SLP session with no signs or symptoms of pain. Pt with decreased frustration tolerance as he reports, " I am ready to do home. I don't know why I have to do this." Pt  agreeable to brush teeth at sink with min cues and handing items needed to pt. He was able to sequence brushing teeth with increased time, able to remove toothpaste top and squeeze onto brush. Pt sitting on edge of wheelchair with min - mod A for sitting balance while tossing ball to sister and counting each rep correctly. Pt requesting to standing with sit <>stand from wheelchair x 2 reps. Pt initiating more on the second stand but still requiring total A and bearing weight through LEs for 1 minute. Pt requesting to remain in wheelchair and quick release belt donned and sister remaining present in room.   Therapy Documentation Precautions:  Precautions Precautions: Fall, Other (comment) Precaution Comments: bone flap removed R side of head in abdomen; hemet for OOB; Pt no longer with NG or PICC; enclosure bed Required Braces or Orthoses: Other Brace/Splint Other Brace/Splint: helmet for OOB Restrictions Weight Bearing Restrictions: No General: General OT Amount of Missed Time: 17 Minutes  See Function Navigator for Current Functional Status.   Therapy/Group: Individual Therapy  [name] 02/15/2017, 1:45 PM

## 2017-02-15 NOTE — Progress Notes (Signed)
Physical Therapy Session Note  Patient Details  Name: [name] MRN: [id number] Date of Birth: Jul 19, 1966  Today's Date: 02/15/2017 PT Individual Time: 7425-9563 and 8756-4332 PT Individual Time Calculation (min): 59 min and 55 min  Short Term Goals: Week 2:  PT Short Term Goal 1 (Week 2): Pt will consistently participate in supine<>sitting with max assist +1. PT Short Term Goal 2 (Week 2): Pt will stand with max assist +1. PT Short Term Goal 3 (Week 2): Pt will consistently participate in bed<>w/c transfers with max assist +1.   Skilled Therapeutic Interventions/Progress Updates:  Treatment 1: Pt received in bed, finishing breakfast with assistance of NT. Pt reports he is finished with breakfast and requires max cuing to don B slipper socks. Pt requires assistance to maintain figure four and assistance with donning R sock. Pt is able to actively move both legs to don socks. Pt transferred supine>sitting EOB with max assist as pt does not initiate movement at all. Pt transferred bed>w/c via Hunt[place] Dec Plus. Transported pt to gym via w/c total assist and pt transferred to standing in standing frame; pt tolerated standing x 8 minutes while engaging in cognitive task. Attempted to have pt recall name of plastic animals but pt unable to correctly identify animals and perseverated on identifying everything as "deer" despite cuing and education. Pt restless and fidgeting with standing frame sling despite cuing to not to do so. Pt requested to sit down and then reported need to use the bathroom. Returned to bathroom in pt's room and pt transferred w/c>toilet with +2 assist and total assist for managing clothing. Pt required max assist for body awareness on toilet and assistance for sitting balance. Pt with continent BM on toilet and transferred sit>stand in Hunt[place] Dec Plus to allow rehab tech to perform peri hygiene total assist. Pt then returned to bed via [place] N[place]. At end of session pt left in enclosure  bed with all needs within reach & family present.   Treatment 2: Pt received in bed & transferred supine>sitting EOB with max assist. Pt required max assist for sitting balance while placing Sara sling. Pt transferred bed<>w/c via [place] Dec Plus requiring max cuing and manual facilitation for RLE placement as pt attempts to move foot off of platform. Transitioned to hallway by gym and attempted gait training with lite gait. Pt requires max assist to clear buttocks from w/c seat for corset placement. Attempted ambulation with lite gait with various methods: pt holding therapist's hands for assistance, holding onto lite gait rails, holding RW, but pt continues to push posteriorly and appears to be very fearful of falling. Pt requires max/total assist to advance BLE as pt resists movement. Pt repeatedly states "I can't" or "we need to all work together" but will not initiate stepping. Pt is able to bear weight through BLE as he will attempt to move RLE onto edge of lite gait base without buckling in LLE knee noted. Pt appeared to become overstimulated with task and returned to w/c. Pt returned to room & transitioned back to bed. At end of session pt left with all needs within reach & NT present.  Therapy Documentation Precautions:  Precautions Precautions: Fall, Other (comment) Precaution Comments: bone flap removed R side of head in abdomen; hemet for OOB; Pt no longer with NG or PICC; enclosure bed Required Braces or Orthoses: Other Brace/Splint Other Brace/Splint: helmet for OOB Restrictions Weight Bearing Restrictions: No  Pain: Faces - no pain.   See Function Navigator for Current Functional Status.  Therapy/Group: Individual Therapy  Sandi Mariscal 02/15/2017, 1:03 PM

## 2017-02-16 ENCOUNTER — Inpatient Hospital Stay (HOSPITAL_COMMUNITY): Payer: Self-pay | Admitting: Physical Therapy

## 2017-02-16 ENCOUNTER — Inpatient Hospital Stay (HOSPITAL_COMMUNITY): Payer: Self-pay | Admitting: Occupational Therapy

## 2017-02-16 ENCOUNTER — Inpatient Hospital Stay (HOSPITAL_COMMUNITY): Payer: Self-pay | Admitting: Speech Pathology

## 2017-02-16 ENCOUNTER — Inpatient Hospital Stay (HOSPITAL_COMMUNITY): Payer: Self-pay

## 2017-02-16 LAB — GLUCOSE, CAPILLARY
GLUCOSE-CAPILLARY: 132 mg/dL — AB (ref 65–99)
Glucose-Capillary: 158 mg/dL — ABNORMAL HIGH (ref 65–99)
Glucose-Capillary: 104 mg/dL — ABNORMAL HIGH (ref 65–99)
Glucose-Capillary: 72 mg/dL (ref 65–99)

## 2017-02-16 NOTE — Progress Notes (Signed)
[place] PHYSICAL MEDICINE & REHABILITATION     PROGRESS NOTE    Subjective/Complaints: Just waking up. In enclosure bed. No new problems per rn  ROS: remains limited due to cognition      Objective: Vital Signs: Blood pressure 103/76, pulse 90, temperature 98 F (36.7 C), temperature source Oral, resp. rate 18, height  (1.575 m), weight 60.5 kg (133 lb 6.1 oz), SpO2 98 %. No results found.  Recent Labs  02/15/17 0544  WBC 4.6  HGB 11.7*  HCT 35.0*  PLT 288    Recent Labs  02/15/17 0544  NA 141  K 3.9  CL 105  GLUCOSE 123*  BUN 10  CREATININE 0.62  CALCIUM 9.0   CBG (last 3)   Recent Labs  02/15/17 1643 02/15/17 2326 02/16/17 0706  GLUCAP 175* 160* 158*    Wt Readings from Last 3 Encounters:  02/10/17 60.5 kg (133 lb 6.1 oz)  02/01/17 65.6 kg (144 lb 9.6 oz)  07/31/16 68 kg (150 lb)    Physical Exam:    Gen: NAD. Well developed.  HENT: Craniotomy site clean and dry with scab anteriorly Cardiovascular:   RRR Respiratory:  CTA B GI: Soft. Bowel sounds are normal. Neurological: He is alert.  Slow initiation.  Left inattention  Psychiatric:   flat Skin. Warm and dry. Intact.   Assessment/Plan: 1. Cognitive and functional deficits secondary to right traumatic ICH/SDH s/p craniectomy which require 3+ hours per day of interdisciplinary therapy in a comprehensive inpatient rehab setting. Physiatrist is providing close team supervision and 24 hour management of active medical problems listed below. Physiatrist and rehab team continue to assess barriers to discharge/monitor patient progress toward functional and medical goals.  Function:  Bathing Bathing position   Position: Shower  Bathing parts Body parts bathed by patient: Right upper leg, Left upper leg, Abdomen, Chest, Left arm Body parts bathed by helper: Right arm, Front perineal area, Buttocks, Right lower leg, Left lower leg, Back  Bathing assist Assist Level: 2 helpers       Upper Body Dressing/Undressing Upper body dressing   What is the patient wearing?: Pull over shirt/dress     Pull over shirt/dress - Perfomed by patient: Thread/unthread right sleeve, Thread/unthread left sleeve, Put head through opening, Pull shirt over trunk Pull over shirt/dress - Perfomed by helper: Pull shirt over trunk        Upper body assist Assist Level: Supervision or verbal cues, Set up   Set up : To obtain clothing/put away  Lower Body Dressing/Undressing Lower body dressing   What is the patient wearing?: Pants     Pants- Performed by patient: Thread/unthread right pants leg Pants- Performed by helper: Thread/unthread left pants leg, Pull pants up/down Non-skid slipper socks- Performed by patient: Don/doff right sock Non-skid slipper socks- Performed by helper: Don/doff left sock               TED Hose - Performed by helper: Don/doff right TED hose, Don/doff left TED hose  Lower body assist Assist for lower body dressing:  (max A)      Toileting Toileting Toileting activity did not occur: Safety/medical concerns   Toileting steps completed by helper: Adjust clothing prior to toileting, Performs perineal hygiene, Adjust clothing after toileting Toileting Assistive Devices: Other (comment) (sara)  Toileting assist Assist level: Two helpers   Transfers Chair/bed transfer   Chair/bed transfer method: Other Chair/bed transfer assist level: 2 helpers Chair/bed transfer assistive device: Mechanical lift Mechanical lift: Huntley Dec  Locomotion Ambulation Ambulation activity did not occur: Safety/medical concerns   Max distance: 5 ft Assist level: Total assist (Pt < 25%)   Wheelchair Wheelchair activity did not occur: Safety/medical concerns   Max wheelchair distance: 150 ft Assist Level: Maximal assistance (Pt 25 - 49%)  Cognition Comprehension Comprehension assist level: Understands basic less than 25% of the time/ requires cueing >75% of the time   Expression Expression assist level: Expresses basic 50 - 74% of the time/requires cueing 25 - 49% of the time. Needs to repeat parts of sentences.  Social Interaction Social Interaction assist level: Interacts appropriately 25 - 49% of time - Needs frequent redirection.  Problem Solving Problem solving assist level: Solves basic 25 - 49% of the time - needs direction more than half the time to initiate, plan or complete simple activities  Memory Memory assist level: Recognizes or recalls 25 - 49% of the time/requires cueing 50 - 75% of the time  Medical Problem List and Plan: 1. 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 DVT Prophylaxis/Anticoagulation: Subcutaneous heparin. Monitor platelet counts and any signs of bleeding. 3. Pain Management: Hycet  4. Mood: Ativan as needed 5. Neuropsych: This patient is notcapable of making decisions on hisown behalf.  - improved patterns overall  -seroquel for sleep/agitation  -continue seroquel during the day for agitation   -limiting benzos  -continue ritalin for arousal and initiation--increased to  on 4/12  -enclosure bed for safety still required--renewed  -soft helmet for safety 6. Skin/Wound Care: Routine skin checks  -helmet for cranium protection  -flap site  Intact  -crani site is clean   7. Fluids/Electrolytes/Nutrition:    -intake reasonable 8.Seizure prophylaxis. Keppra 750 mg twice daily 9.Pneumonia. Five-day course of Ancef  completed 01/22/2017 10.Dysphagia. On D1 thins. Taking meds without issues   11.Urinary retention/Escherichia coli UTI.  Completed course of Cipro.   -emptied a little better yesterday  -timed voids up to toilet    -resumed urecholine at  TID.    - f/u ucx negative 12.Diabetes mellitus with peripheral  neuropathy. Hemoglobin A1c 10.4.  Patient on Glucophage 500 mg twice a day and Glucotrol 5 mg daily at home  -resumed glucotrol as sugars rising  -lantus currently at 15u qhs  -  Improvement overall---no changes today 13.History of hypertension. Patient on lisinopril 2.5 mg daily prior to admission. Resume as needed  Under control 14.Constipation. Laxative assistance   LOS (Days) 15 A FACE TO FACE EVALUATION WAS PERFORMED  [name], MD 02/16/2017 8:46 AM

## 2017-02-16 NOTE — Progress Notes (Signed)
Nutrition Follow-up  DOCUMENTATION CODES:   Not applicable  INTERVENTION:  Continue Glucerna Shake po TID, each supplement provides 220 kcal and 10 grams of protein.  Encourage adequate PO intake.  NUTRITION DIAGNOSIS:   Increased nutrient needs related to acute illness as evidenced by estimated needs; ongoing  GOAL:   Patient will meet greater than or equal to 90% of their needs; met  MONITOR:   PO intake, Supplement acceptance, Labs, Weight trends, Skin, I & O's, Diet advancement  REASON FOR ASSESSMENT:   Consult Enteral/tube feeding initiation and management  ASSESSMENT:   [name] with history of hypertension, diabetes mellitus. Presented 01/10/2017 after being found down from a supposed fall. CT of the head and imaging revealed intracranial hemorrhage with a 4.5 cm right frontal intraparenchymal hemorrhage, 6 mm right subdural hematoma and other scattered areas of intraparenchymal and subarachnoid hemorrhage with right to left midline shift. Nondisplaced vertical fracture through the left temporal bone.  Underwent right frontal temporal parietal craniectomy for hematoma evacuation 01/14/2017 per Dr. Cyndy Freeze with placement of bone flap in the abdomen.  Nasogastric tube in place for nutritional support patient remains NPO  Pt continues on a dysphagia 1 diet with thin liquids. Meal completion has been varied from 25-100% with most recent po intake of 90-100%. Pt currently has Glucerna Shake ordered and has been consuming them. RD to continue with current orders.   Diet Order:  DIET - DYS 1 Room service appropriate? Yes; Fluid consistency: Thin  Skin:   (Incision on head R)  Last BM:  4/17  Height:   Ht Readings from Last 1 Encounters:  02/01/17 _0  (1.575 m)    Weight:   Wt Readings from Last 1 Encounters:  02/10/17 133 lb 6.1 oz (60.5 kg)    Ideal Body Weight:  53.6 kg  BMI:  Body mass index is 24.4 kg/m.  Estimated  Nutritional Needs:   Kcal:  1800-2000  Protein:  100-115 grams  Fluid:  >/= 1.8 L/day  EDUCATION NEEDS:   No education needs identified at this time  Corrin Parker, MS, RD, LDN Pager # [phone] After hours/ weekend pager # [phone]

## 2017-02-16 NOTE — Progress Notes (Signed)
Occupational Therapy Session Note  Patient Details  Name: [name] MRN: [id number] Date of Birth: [birth year]  Today's Date: 02/16/2017 OT Individual Time: 1000-1100 and 1400-1428 OT Individual Time Calculation (min): 60 min and 28 min    Short Term Goals: Week 2:  OT Short Term Goal 1 (Week 2): Pt will perform toilet transfer with max A of 1 in order to decrease level of assistance needed. OT Short Term Goal 2 (Week 2): Pt will maintain sitting balance with mod A or less for 3 minutes during functional task. OT Short Term Goal 3 (Week 2): Pt will sustain attention during bathing and dressing for 2 minutes with mod multimodal cues.   Skilled Therapeutic Interventions/Progress Updates:    Session 1: Upon entering the room, pt supine in bed awaiting therapist with no c/o,signs, or symptoms of pain. Pt returned greeting to therapist. Supine >sit with min A to EOB. Stand pivot transfer from bed to wheelchair with max A and pt bearing weight on B LEs and initiating transfer this session. Pt propelled wheelchair 55' with max A and pt utilized R UE only after maximal verbal and demonstrational cues. Pt seated at table and able to match cards 2 cards with 3 card option presented 6/8 times. Pt able to explain what characters were potentially doing as displayed on card correctly with mod verbal questioning cues. Pt verbalized he likes to cook and especially enjoys cooking meatballs. Pt able to provide therapist with list of needed items to make meatballs. Pt requesting to go to kitchen to locate items but was able to determine most items listed no currently in kitchen. Pt able to verbally navigate correct path back to room. Pt remained in wheelchair with quick release belt donned and PT arriving for next session.   Session 2: Upon entering the room, pt seated in wheelchair awaiting therapist and stating, "let's go! Go, so that I can get out of here." OT utilized signs on wall for pt orientation  to time, location, and situation with max cues. OT propelled pt via wheelchair into day room. Pt sitting and writing name correctly but home address and date written incorrectly. Pt requesting to return to room with therapist assisting pt back and providing max A for stand pivot transfer from wheelchair >bed. Pt performed sit >supine with min A and mod cues for sequencing. Enclosure bed secured and family present in room.   Therapy Documentation Precautions:  Precautions Precautions: Fall, Other (comment) Precaution Comments: bone flap removed R side of head in abdomen; hemet for OOB; Pt no longer with NG or PICC; enclosure bed Required Braces or Orthoses: Other Brace/Splint Other Brace/Splint: helmet for OOB Restrictions Weight Bearing Restrictions: No General:   Vital Signs:   Pain: Pain Assessment Pain Assessment: No/denies pain Pain Score: 0-No pain ADL:   Exercises:   Other Treatments:    See Function Navigator for Current Functional Status.   Therapy/Group: Individual Therapy  [name] 02/16/2017, 12:46 PM

## 2017-02-16 NOTE — Progress Notes (Signed)
Physical Therapy Session Note  Patient Details [name] MRN: [id number] Date of Birth: Sep 14, 1966  Today's Date: 02/16/2017 PT Individual Time: 1100-1155 PT Individual Time Calculation (min): 55 min    Skilled Therapeutic Interventions/Progress Updates:    Session focused on functional participation in therapeutic activities to address functional transfers, w/c mobility, balance activities, and cognitive remediation to address attention, initiation, following commands, sequencing, problem solving, and perception. Engaged in use of Nustep for active assisted reciprocal movement in BUE/BLE when given time to process able to eventually complete a few repetitions with max cues. Boxing activity with pt able to successfully complete 5 repetitions when asked to from seated position. Redirection needed to focus on this task. Gym becoming increasingly busy and loud, and removed pt from overly stimulated environment to move to a quieter location. Pt given time to practice taking off boxing glove and demonstrates perceptual impairments on LUE with removal of glove but eventually able to do it. Pt beginning to put hands down pants and asked if needed to bathroom and pt confirms this need. Returned to room and engaged in toilet transfers with +2 assist and max cues and facilitation for sit <> stands with total assist and +2 needed for safety and to complete squat/stand pivot transfers. Pt unsuccessful with attempt. Returned back to bed to rest with +2 assist and pt positioned himself in supine with supervision. Pt perseverates during session on "let's go" and "we aren't getting anything accomplished with 4 people". Redirection and reorientation provided as able.   Medical interpreter present throughout session. Pt's nephew also present.   Therapy Documentation Precautions:  Precautions Precautions: Fall, Other (comment) Precaution Comments: bone flap removed R side of head in abdomen;  hemet for OOB; Pt no longer with NG or PICC; enclosure bed Required Braces or Orthoses: Other Brace/Splint Other Brace/Splint: helmet for OOB Restrictions Weight Bearing Restrictions: No  Pain: No reports of pain.    See Function Navigator for Current Functional Status.   Therapy/Group: Individual Therapy  Karolee Stamps Darrol Poke, PT, DPT  02/16/2017, 12:06 PM

## 2017-02-16 NOTE — Progress Notes (Signed)
Physical Therapy Session Note  Patient Details  Name: [name] MRN: [id number] Date of Birth: 1966-10-21  Today's Date: 02/16/2017 PT Individual Time: 0807-0902 PT Individual Time Calculation (min): 55 min   Short Term Goals: Week 3:  PT Short Term Goal 1 (Week 3): Pt will stand with max assist +1.  PT Short Term Goal 2 (Week 3): Pt will intiate all functional mobility tasks 50% of the time. PT Short Term Goal 3 (Week 3): Pt will ambulate 10 ft with max assist +1.  Skilled Therapeutic Interventions/Progress Updates:  Pt received asleep in bed but awakened with verbal & tactile stimulation. Pt required max encouragement and total assist for donning pants, shirt, and helmet as pt unable to initiate task. Pt transferred supine>sitting EOB with total assist due to lethargy & poor initiation. Pt transferred bed>TIS w/c via squat pivot with max assist +1 and pt able to initiate and assist with movement. Pt setup at sink and required total HOH assist to wash hands. Transported pt to dayroom with session focusing on consuming breakfast and increased awareness. Pt required max cuing and assist to consume small bites and occasional assistance to hold cup to drink out of, as pt instead attempted to remove straw from cup then drink through straw. Pt required cuing to remove lid from plate and well as max cuing to swallow bolus; pt would attempt to chew and then hold bolus in mouth for prolonged periods of time. Pt also appears to be inconsistent with "yes/no" answers throughout session. Pt was able to attend to food on all areas of plate without cuing. At end of session pt left sitting in TIS w/c with QRB donned in room, with NT present.   Therapy Documentation Precautions:  Precautions Precautions: Fall, Other (comment) Precaution Comments: bone flap removed R side of head in abdomen; hemet for OOB; Pt no longer with NG or PICC; enclosure bed Required Braces or Orthoses: Other  Brace/Splint Other Brace/Splint: helmet for OOB Restrictions Weight Bearing Restrictions: No  Pain: Denied c/o pain.   See Function Navigator for Current Functional Status.   Therapy/Group: Individual Therapy  Sandi Mariscal 02/16/2017, 12:23 PM

## 2017-02-16 NOTE — Progress Notes (Signed)
Speech Language Pathology Weekly Progress and Session Note  Patient Details  Name: [name] MRN: [id number] Date of Birth: 1966/04/24  Beginning of progress report period: February 09, 2017 End of progress report period: February 16, 2017  Today's Date: 02/16/2017 SLP Individual Time: 1300-1400 SLP Individual Time Calculation (min): 60 min  Short Term Goals: Week 2: SLP Short Term Goal 1 (Week 2): Patient will consume current diet with minimal overt s/s of aspiration with Mod A verbal cues for use of swallowing compensatory strategies.  SLP Short Term Goal 1 - Progress (Week 2): Progressing toward goal SLP Short Term Goal 2 (Week 2): Patient will demonstrate sustained attention to a functional task for ~2 minutes with Mod A verbal cues for redirection.  SLP Short Term Goal 2 - Progress (Week 2): Progressing toward goal SLP Short Term Goal 3 (Week 2): Patient will orient to place, situation and time with Mod A multimodal cues.  SLP Short Term Goal 3 - Progress (Week 2): Progressing toward goal SLP Short Term Goal 4 (Week 2): Patient will verbally respond to questions in 50% of opportunities with Max A multimodal cues.  SLP Short Term Goal 4 - Progress (Week 2): Met SLP Short Term Goal 5 (Week 2): Patient will demonstrate basic problem solving for basic tasks with Max A verbal cues.  SLP Short Term Goal 5 - Progress (Week 2): Met    New Short Term Goals: Week 3: SLP Short Term Goal 1 (Week 3): Patient will consume current diet with minimal overt s/s of aspiration with Mod A verbal cues for use of swallowing compensatory strategies.  SLP Short Term Goal 2 (Week 3): Patient will demonstrate sustained attention to a functional task for ~2 minutes with Mod A verbal cues for redirection.  SLP Short Term Goal 3 (Week 3): Patient will orient to place, situation and time with Mod A multimodal cues.  SLP Short Term Goal 4 (Week 3): Patient will verbally respond to questions in 80% of  opportunities with Max A multimodal cues.  SLP Short Term Goal 5 (Week 3): Patient will demonstrate basic problem solving for basic tasks with Mod A verbal cues.   Weekly Progress Updates: Patient has made functional gains and has met 2 out of 5 short term goals this reporting period due to improved verbal responses and basic problem solving with highly structure and familiar tasks.  Currently, patient continues to require mostly Max assist for basic tasks. Patient and family education is ongoing at this time. Patient would benefit from continued skilled SLP intervention to maximize functional independence and to maximize their functional independence prior to discharge with 24 hour supervision.   Intensity: Minumum of 1-2 x/day, 30 to 90 minutes Frequency: 3 to 5 out of 7 days Duration/Length of Stay: 4 weeks  Treatment/Interventions: English as a second language teacher;Dysphagia/aspiration precaution training;Internal/external aids;Environmental controls;Therapeutic Activities;Patient/family education;Functional tasks;Cognitive remediation/compensation  Daily Session  Skilled Therapeutic Interventions:  Skilled treatment session focused on addressing dysphagia and cognition goals. SLP facilitated session by transferring patient out of bed, 2+ assist with Clarise Cruz, for increased arousal for PO.  SLP facilitated session by reducing environmental distractions and options on tray as well as providing Max assist multimodal cues for portion control and pacing.  Removal of spoon paired with handing patient the cup of thin liquids was effective at assisting with oral clearance of Dys.1 textures with no overt s/s of aspiration.  SLP able to fade cues to Mod assist multimodal cues during session today for pacing but patient continued  to require Max assist multimodal cues for oral clearance.  After lunch patient reported needing to go to the bathroom, 2+ assist with Clarise Cruz was utilized for transfer with Max assist multimodal cues to  sequence and problem solve.  Patient with continent bowel and urination while on the commode.  Patient left upright in wheelchair with quick release belt and nurse tech present.  Continue with current plan of care.      Function:   Eating Eating   Modified Consistency Diet: Yes Eating Assist Level: Helper checks for pocketed food;Supervision or verbal cues;Set up assist for;Help with picking up utensils;Help managing cup/glass   Eating Set Up Assist For: Opening containers;Cutting food Helper Scoops Food on Utensil: Occasionally Helper Brings Food to Mouth: Occasionally   Cognition Comprehension Comprehension assist level: Understands basic 25 - 49% of the time/ requires cueing 50 - 75% of the time  Expression   Expression assist level: Expresses basic 50 - 74% of the time/requires cueing 25 - 49% of the time. Needs to repeat parts of sentences.  Social Interaction Social Interaction assist level: Interacts appropriately 25 - 49% of time - Needs frequent redirection.  Problem Solving Problem solving assist level: Solves basic less than 25% of the time - needs direction nearly all the time or does not effectively solve problems and may need a restraint for safety  Memory Memory assist level: Recognizes or recalls less than 25% of the time/requires cueing greater than 75% of the time   General    Pain Pain Assessment Pain Assessment: No/denies pain  Therapy/Group: Individual Therapy  Carmelia Roller., CCC-SLP [phone]  [facility] 02/16/2017, 4:32 PM

## 2017-02-17 ENCOUNTER — Inpatient Hospital Stay (HOSPITAL_COMMUNITY): Payer: Self-pay | Admitting: Speech Pathology

## 2017-02-17 ENCOUNTER — Inpatient Hospital Stay (HOSPITAL_COMMUNITY): Payer: Self-pay | Admitting: Physical Therapy

## 2017-02-17 ENCOUNTER — Inpatient Hospital Stay (HOSPITAL_COMMUNITY): Payer: Self-pay

## 2017-02-17 LAB — GLUCOSE, CAPILLARY
GLUCOSE-CAPILLARY: 87 mg/dL (ref 65–99)
GLUCOSE-CAPILLARY: 122 mg/dL — AB (ref 65–99)
GLUCOSE-CAPILLARY: 187 mg/dL — AB (ref 65–99)
Glucose-Capillary: 148 mg/dL — ABNORMAL HIGH (ref 65–99)

## 2017-02-17 MED ORDER — LEVETIRACETAM 100 MG/ML PO SOLN
500.0000 mg | Freq: Two times a day (BID) | ORAL | Status: DC
  Administered 2017-02-17 – 2017-03-03 (×28): 500 mg via ORAL
  Filled 2017-02-17 (×28): qty 5

## 2017-02-17 NOTE — Progress Notes (Signed)
Occupational Therapy Weekly Progress Note and OT Intervention  Patient Details  Name: [name] MRN: [id number] Date of Birth: Mar 28, 1966  Beginning of progress report period: February 09, 2017 End of progress report period: February 17, 2017  Today's Date: 02/17/2017 OT Individual Time: 1000-1100 OT Individual Time Calculation (min): 60 min    Patient has met 2 of 3 short term goals.  Pt is making significant progress this week towards occupational therapy goals. Pt's progress ranges secondary to level of fatigue. However, pt has been able to increase participation through initiation, sequencing, and attention during functional tasks. Pt is currently utilizing L UE with more routine functional tasks that require bilateral coordination. Functional transfers range from max A - total A. Family has been present during therapy sessions for observation.  Patient continues to demonstrate the following deficits: muscle weakness, decreased cardiorespiratoy endurance, decreased attention to left, decreased initiation, decreased attention, decreased awareness, decreased problem solving, decreased safety awareness, decreased memory and delayed processing and decreased sitting balance, decreased standing balance and decreased balance strategies and therefore will continue to benefit from skilled OT intervention to enhance overall performance with BADL.  Patient progressing toward long term goals..  Continue plan of care.  OT Short Term Goals Week 2:  OT Short Term Goal 1 (Week 2): Pt will perform toilet transfer with max A of 1 in order to decrease level of assistance needed. OT Short Term Goal 1 - Progress (Week 2): Progressing toward goal OT Short Term Goal 2 (Week 2): Pt will maintain sitting balance with mod A or less for 3 minutes during functional task. OT Short Term Goal 2 - Progress (Week 2): Met OT Short Term Goal 3 (Week 2): Pt will sustain attention during bathing and dressing for 2  minutes with mod multimodal cues. OT Short Term Goal 3 - Progress (Week 2): Met Week 3:  OT Short Term Goal 1 (Week 3): Pt will perform toileting with max A in order to decrease assistance with functional task.  OT Short Term Goal 2 (Week 3): Pt will utilize L UE in functional tasks with mod multimodal cues. OT Short Term Goal 3 (Week 3): Pt will initiate sit <>stand for LB clothing management with mod A.   Skilled Therapeutic Interventions/Progress Updates:    Pt received at RN station and motivated this session for shower. OT returned pt to room via wheelchair. Pt transferred from wheelchair > TTB with max A and max multimodal cues for initiation of transfer. Pt seated on TTB with min A sitting balance during shower. Pt utilized L UE to wash R UE with mod cues this session. Pt initiating stand with min A while in shower and holding onto grab bars for therapist to assist with washing buttocks. Pt notified therapist when he was done and returned to wheelchair in same manner. Pt dressing in wheelchair at sink. When pt handed pull over shirt he initially attempted to don on B LE's and required max multimodal cues and mod A for UB dressing.  Max A to stand from wheelchair with max multimodal cues for LB clothing management. Pt remained in wheelchair at end of session with quick release belt donned and PT arriving for next session.   Therapy Documentation Precautions:  Precautions Precautions: Fall, Other (comment) Precaution Comments: bone flap removed R side of head in abdomen; hemet for OOB; Pt no longer with NG or PICC; enclosure bed Required Braces or Orthoses: Other Brace/Splint Other Brace/Splint: helmet for OOB Restrictions Weight Bearing Restrictions:  No  See Function Navigator for Current Functional Status.   Therapy/Group: Individual Therapy  ,  P 02/17/2017, 8:19 PM   

## 2017-02-17 NOTE — Progress Notes (Signed)
Physical Therapy Session Note  Patient Details  Name: [name] MRN: [id number] Date of Birth: 11-Apr-1966  Today's Date: 02/17/2017 PT Individual Time: 805-902 and 1103-1150 PT Individual Time Calculation (min): 57 min and 47 min   Short Term Goals: Week 3:  PT Short Term Goal 1 (Week 3): Pt will stand with max assist +1.  PT Short Term Goal 2 (Week 3): Pt will intiate all functional mobility tasks 50% of the time. PT Short Term Goal 3 (Week 3): Pt will ambulate 10 ft with max assist +1.  Skilled Therapeutic Interventions/Progress Updates:  Treatment 1: Pt received in bed with family present for initial portion of session. Pt initiated donning pants and socks but required max assist to thread both on BLE. Pt able to pull pants over hips with extra time and cuing. Pt transferred supine>sitting EOB with max assist 2/2 poor initiation and participation in movement. Pt transferred bed>w/c via stand pivot with mod assist as pt initiated and completed movement, requiring assistance for movement and placement. Pt completed hand hygiene at sink with cuing to wash both hands and set up assist at sink. Transported pt to day room where pt consumed breakfast with moderate cuing for small bites and to swallow bolus. Pt with improved ability to take 2 bites then consume liquid, as well as improved ability to swallow bolus on this date. After breakfast pt reports need to use restroom and was able to transfer sit<>stand with mod assist and grab bar in bathroom. Pt did require +2 assist to manage clothing and pivot w/c<>toilet; pt unable to use commode. Transitioned to hallway outside of gym and pt initiated sit>stand with mod physical assist and ambulated 3 ft + 15 ft with BUE supported on rail, max assist overall, and cuing. At end of session pt left sitting in TIS w/c at nurses station with QRB & BUE mittens donned.   Treatment 2: Pt received in handoff from OT. Pt transported to day room via w/c  total assist and transferred w/c>mat on floor with +2 assist as pt with minimal initiation of activity, repeatedly stating "just push me". Pt required assistance to flex knees to kneel down on floor. Once on floor pt able to roll L from supine>prone. Pt required assistance to move LUE to help support his trunk but chose not to maintain position of extremity. Attempted to have pt demonstrate various movements on the floor but pt with poor initiation. Also attempted to have pt transfer floor>w/c or couch with +2 assist to initiate movement but pt not participating in activity. Utilized maximove to transfer pt floor>w/c. Encouraged pt to transfer to standing with use of rail in hallway to allow removal of maximove sling but pt repeatedly stating "no". Pt returned to room and transferred w/c>bed with +2 assist with improved initiation and participation in task. Pt unable to sequence stand pivot transfer and sit>supine. At end of session pt left in enclosure bed with all needs within reach.   Therapy Documentation Precautions:  Precautions Precautions: Fall, Other (comment) Precaution Comments: bone flap removed R side of head in abdomen; hemet for OOB; Pt no longer with NG or PICC; enclosure bed Required Braces or Orthoses: Other Brace/Splint Other Brace/Splint: helmet for OOB Restrictions Weight Bearing Restrictions: No  General: PT Amount of Missed Time (min): 13 Minutes PT Missed Treatment Reason: Patient fatigue  Pain: Faces - no pain.   See Function Navigator for Current Functional Status.   Therapy/Group: Individual Therapy  Sandi Mariscal 02/17/2017,  12:35 PM

## 2017-02-17 NOTE — Progress Notes (Signed)
[facility]     PROGRESS NOTE    Subjective/Complaints: Pt in bed. Slept ok last night. Sister concerned about his flap and the fact that he can't go home without it being replaced. Became tearful when we discussed the proposition of him returning home  ROS: Limited due cognitive/behavioral      Objective: Vital Signs: Blood pressure 109/64, pulse 85, temperature 98.3 F (36.8 C), temperature source Oral, resp. rate 17, height  (1.575 m), weight 61.1 kg (134 lb 11.2 oz), SpO2 99 %. No results found.  Recent Labs  02/15/17 0544  WBC 4.6  HGB 11.7*  HCT 35.0*  PLT 288    Recent Labs  02/15/17 0544  NA 141  K 3.9  CL 105  GLUCOSE 123*  BUN 10  CREATININE 0.62  CALCIUM 9.0   CBG (last 3)   Recent Labs  02/16/17 1633 02/16/17 2201 02/17/17 0659  GLUCAP 132* 72 148*    Wt Readings from Last 3 Encounters:  02/17/17 61.1 kg (134 lb 11.2 oz)  02/01/17 65.6 kg (144 lb 9.6 oz)  07/31/16 68 kg (150 lb)    Physical Exam:    Gen: NAD. Well developed.  HENT: Craniotomy site clean and dry with scab anteriorly--stable Cardiovascular:   RRR Respiratory:  CTA B GI: Soft. Bowel sounds are normal. Neurological: He is alert.  Slow to initiate. Left inattention  Psychiatric:   flat Skin. Warm and dry. Intact.   Assessment/Plan: 1. Cognitive and functional deficits secondary to right traumatic ICH/SDH s/p craniectomy which require 3+ hours per day of interdisciplinary therapy in a comprehensive inpatient rehab setting. Physiatrist is providing close team supervision and 24 hour management of active medical problems listed below. Physiatrist and rehab team continue to assess barriers to discharge/monitor patient progress toward functional and medical goals.  Function:  Bathing Bathing position   Position: Shower  Bathing parts Body parts bathed by patient: Right upper leg, Left upper leg, Abdomen, Chest, Left arm Body parts  bathed by helper: Right arm, Front perineal area, Buttocks, Right lower leg, Left lower leg, Back  Bathing assist Assist Level: 2 helpers      Upper Body Dressing/Undressing Upper body dressing   What is the patient wearing?: Pull over shirt/dress     Pull over shirt/dress - Perfomed by patient: Thread/unthread right sleeve, Thread/unthread left sleeve, Put head through opening, Pull shirt over trunk Pull over shirt/dress - Perfomed by helper: Pull shirt over trunk        Upper body assist Assist Level: Supervision or verbal cues, Set up   Set up : To obtain clothing/put away  Lower Body Dressing/Undressing Lower body dressing   What is the patient wearing?: Pants     Pants- Performed by patient: Thread/unthread right pants leg Pants- Performed by helper: Thread/unthread left pants leg, Pull pants up/down Non-skid slipper socks- Performed by patient: Don/doff right sock Non-skid slipper socks- Performed by helper: Don/doff left sock               TED Hose - Performed by helper: Don/doff right TED hose, Don/doff left TED hose  Lower body assist Assist for lower body dressing:  (max A)      Toileting Toileting Toileting activity did not occur: Safety/medical concerns   Toileting steps completed by helper: Adjust clothing prior to toileting, Performs perineal hygiene, Adjust clothing after toileting Toileting Assistive Devices: Other (comment) (sara)  Toileting assist Assist level: Two helpers   Transfers Chair/bed  transfer   Chair/bed transfer method: Squat pivot Chair/bed transfer assist level: Maximal assist (Pt 25 - 49%/lift and lower) Chair/bed transfer assistive device: Armrests Mechanical lift: Scientist, water quality activity did not occur: Safety/medical concerns   Max distance: 5 ft Assist level: Total assist (Pt < 25%)   Wheelchair Wheelchair activity did not occur: Safety/medical concerns   Max wheelchair distance: 42' Assist Level:  Maximal assistance (Pt 25 - 49%)  Cognition Comprehension Comprehension assist level: Understands basic 25 - 49% of the time/ requires cueing 50 - 75% of the time  Expression Expression assist level: Expresses basic 50 - 74% of the time/requires cueing 25 - 49% of the time. Needs to repeat parts of sentences.  Social Interaction Social Interaction assist level: Interacts appropriately 25 - 49% of time - Needs frequent redirection.  Problem Solving Problem solving assist level: Solves basic less than 25% of the time - needs direction nearly all the time or does not effectively solve problems and may need a restraint for safety  Memory Memory assist level: Recognizes or recalls less than 25% of the time/requires cueing greater than 75% of the time  Medical Problem List and Plan: 1. 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 Explained to her that he has more healing to do and that he is not ready to return home at this time. However, he MAY go home without the flap replaced.   2. DVT Prophylaxis/Anticoagulation: Subcutaneous heparin. Monitor platelet counts and any signs of bleeding. 3. Pain Management: Hycet  4. Mood: Ativan as needed 5. Neuropsych: This patient is notcapable of making decisions on hisown behalf.  - improved patterns overall  -seroquel for sleep/agitation  -will stop day time seroquel to avoid daytime sedation   -limiting benzos  -continue ritalin for arousal and initiation--increased to  on 4/12  -enclosure bed for safety still required--renewed  -soft helmet for safety 6. Skin/Wound Care: Routine skin checks  -helmet for cranium protection  -flap site  Intact  -crani site is clean   7. Fluids/Electrolytes/Nutrition:     -intake reasonable 8.Seizure prophylaxis. Keppra 750 mg twice daily 9.Pneumonia. Five-day course of Ancef  completed 01/22/2017 10.Dysphagia. On D1 thins. Taking meds without issues   11.Urinary retention/Escherichia coli UTI.  Completed course of Cipro.   -emptying but incontinent  -continue urecholine 12.Diabetes mellitus with peripheral neuropathy. Hemoglobin A1c 10.4.  Patient on Glucophage 500 mg twice a day and Glucotrol 5 mg daily at home  -resumed glucotrol as sugars rising  -lantus currently at 15u qhs  -  Improvement overall--  13.History of hypertension. Patient on lisinopril 2.5 mg daily prior to admission. Resume as needed  Under control 14.Constipation. Laxative assistance   LOS (Days) 16 A FACE TO FACE EVALUATION WAS PERFORMED  [name], MD 02/17/2017 9:15 AM

## 2017-02-17 NOTE — Progress Notes (Signed)
Speech Language Pathology Daily Session Note  Patient Details  Name: [name] MRN: [id number] Date of Birth: Mar 21, 1966  Today's Date: 02/17/2017 SLP Individual Time: 1300-1330 SLP Individual Time Calculation (min): 30 min  Short Term Goals: Week 3: SLP Short Term Goal 1 (Week 3): Patient will consume current diet with minimal overt s/s of aspiration with Mod A verbal cues for use of swallowing compensatory strategies.  SLP Short Term Goal 2 (Week 3): Patient will demonstrate sustained attention to a functional task for ~2 minutes with Mod A verbal cues for redirection.  SLP Short Term Goal 3 (Week 3): Patient will orient to place, situation and time with Mod A multimodal cues.  SLP Short Term Goal 4 (Week 3): Patient will verbally respond to questions in 80% of opportunities with Max A multimodal cues.  SLP Short Term Goal 5 (Week 3): Patient will demonstrate basic problem solving for basic tasks with Mod A verbal cues.   Skilled Therapeutic Interventions: Skilled treatment session focused on dysphagia and cognitive goals. Upon arrival, patient was awake while in enclosure bed and was transferred to the wheelchair with Mod A. SLP facilitated session by reducing environmental distractions and options on tray as well as providing Max assist multimodal cues for portion control and pacing. Removal of spoon paired with handing patient the cup of thin liquids was effective at assisting with oral clearance of Dys.1 textures with no overt s/s of aspiration. Oral care was also performed at end of meal.  Patient demonstrated selective attention to self-feeding in a mildly distracting environment for 20 minutes with Mod I. Patient handed off to PT. Continue with current plan of care.      Function:  Eating Eating   Modified Consistency Diet: Yes Eating Assist Level: Helper checks for pocketed food;Supervision or verbal cues;Set up assist for;Help with picking up utensils;Help managing  cup/glass   Eating Set Up Assist For: Opening containers       Cognition Comprehension Comprehension assist level: Understands basic 25 - 49% of the time/ requires cueing 50 - 75% of the time  Expression   Expression assist level: Expresses basic 50 - 74% of the time/requires cueing 25 - 49% of the time. Needs to repeat parts of sentences.  Social Interaction Social Interaction assist level: Interacts appropriately 25 - 49% of time - Needs frequent redirection.  Problem Solving Problem solving assist level: Solves basic less than 25% of the time - needs direction nearly all the time or does not effectively solve problems and may need a restraint for safety  Memory Memory assist level: Recognizes or recalls less than 25% of the time/requires cueing greater than 75% of the time    Pain No/Denies Pain   Therapy/Group: Individual Therapy  Carrell Rahmani 02/17/2017, 4:02 PM

## 2017-02-17 NOTE — Progress Notes (Signed)
Physical Therapy Session Note  Patient Details  Name: [name] MRN: [id number] Date of Birth: May 14, 1966  Today's Date: 02/17/2017 PT Individual Time: 4098-1191 PT Individual Time Calculation (min): 1373 min   Short Term Goals: Week 3:  PT Short Term Goal 1 (Week 3): Pt will stand with max assist +1.  PT Short Term Goal 2 (Week 3): Pt will intiate all functional mobility tasks 50% of the time. PT Short Term Goal 3 (Week 3): Pt will ambulate 10 ft with max assist +1.  Skilled Therapeutic Interventions/Progress Updates:    Pt sitting in w/c upon arrival, agreeable to PT session. Transfers: attempting sit<>stand initially with hall rail X3 unsuccessfully with pt pushing heavily posteriorly. Modified to pt reaching Lt arm over PT and pt then standing with mod assist. Ambulating initially 10 ft using PT on Lt and rail on rt- close chair follow. Distance progressing to 25 ft X 4 and 30 ft X2. Last ambulation distances of 30 ft performed with Lt arm around PT shoulder and HHA on Rt. Manual assist provided at trunk and pelvis. Difficulty with turning to sit especially if turning to Rt (reaching for chair with Lt arm). Pt returned to room via w/c due to fatigue and pt requesting return to bed. Briefs applied with rolling in bed as previous pair came off during ambulation. Pt in enclosure bed with family present and all needs in reach.  Communication provided via Nurse, learning disability. Therapy Documentation Precautions:  Precautions Precautions: Fall, Other (comment) Precaution Comments: bone flap removed R side of head in abdomen; hemet for OOB; Pt no longer with NG or PICC; enclosure bed Required Braces or Orthoses: Other Brace/Splint Other Brace/Splint: helmet for OOB Restrictions Weight Bearing Restrictions: No General: PT Amount of Missed Time (min): 13 Minutes PT Missed Treatment Reason: Patient fatigue Pain:  Denies pain, monitored throughout session.   See Function Navigator for  Current Functional Status.   Therapy/Group: Individual Therapy  Delton See, PT 02/17/2017, 4:11 PM

## 2017-02-18 ENCOUNTER — Inpatient Hospital Stay (HOSPITAL_COMMUNITY): Payer: Self-pay | Admitting: Physical Therapy

## 2017-02-18 ENCOUNTER — Inpatient Hospital Stay (HOSPITAL_COMMUNITY): Payer: Self-pay | Admitting: Occupational Therapy

## 2017-02-18 ENCOUNTER — Inpatient Hospital Stay (HOSPITAL_COMMUNITY): Payer: Self-pay | Admitting: Speech Pathology

## 2017-02-18 ENCOUNTER — Inpatient Hospital Stay (HOSPITAL_COMMUNITY): Payer: Self-pay | Admitting: *Deleted

## 2017-02-18 LAB — GLUCOSE, CAPILLARY
GLUCOSE-CAPILLARY: 89 mg/dL (ref 65–99)
Glucose-Capillary: 159 mg/dL — ABNORMAL HIGH (ref 65–99)
Glucose-Capillary: 125 mg/dL — ABNORMAL HIGH (ref 65–99)
Glucose-Capillary: 117 mg/dL — ABNORMAL HIGH (ref 65–99)

## 2017-02-18 NOTE — Progress Notes (Signed)
Physical Therapy Session Note  Patient Details  Name: [name] MRN: [id number] Date of Birth: 13-Apr-1966  Today's Date: 02/18/2017 PT Individual Time: 5726-2035 PT Individual Time Calculation (min): 33 min   Short Term Goals: Week 2:  PT Short Term Goal 1 (Week 2): Pt will consistently participate in supine<>sitting with max assist +1. PT Short Term Goal 1 - Progress (Week 2): Not met PT Short Term Goal 2 (Week 2): Pt will stand with max assist +1. PT Short Term Goal 2 - Progress (Week 2): Not met PT Short Term Goal 3 (Week 2): Pt will consistently participate in bed<>w/c transfers with max assist +1. PT Short Term Goal 3 - Progress (Week 2): Progressing toward goal Week 3:  PT Short Term Goal 1 (Week 3): Pt will stand with max assist +1.  PT Short Term Goal 2 (Week 3): Pt will intiate all functional mobility tasks 50% of the time. PT Short Term Goal 3 (Week 3): Pt will ambulate 10 ft with max assist +1.  Skilled Therapeutic Interventions/Progress Updates:    Tx focused on functional mobility training, gait with +2 HHA, and NMR via cognitive remediation, manual facilitation, and multi-modal cues. Family and interpreter present. Pt in bed.  Supine>sit with Mod A and multi-modal cues for initiation and task completion.  Stand-pivot transfer bed>WC<>toilet>WC>bed throughout tx with increased time and mod A overall and manual facilitation for anterior translation over BOS,  initiation and turn completion. Safety cues for impulsivity and helmet compliance.  Gait in hall with +2 for UE support and third for WC follow. Manual facilitation for weight shifting and pelvic rotation to continue task. Attempted multi-modal cues for upright trunk and hip ext, but pt unable to significantly adjust.  Pt left up in bed with handoff to OT. Family very excited for his progress.    Therapy Documentation Precautions:  Precautions Precautions: Fall, Other (comment) Precaution Comments: bone  flap removed R side of head in abdomen; hemet for OOB; Pt no longer with NG or PICC; enclosure bed Required Braces or Orthoses: Other Brace/Splint Other Brace/Splint: helmet for OOB Restrictions Weight Bearing Restrictions: No General: PT Amount of Missed Time (min): 20 Minutes PT Missed Treatment Reason: Patient fatigue;Patient unwilling to participate Vital Signs:   Pain: none    See Function Navigator for Current Functional Status.   Therapy/Group: Individual Therapy  Kennieth Rad, PT, DPT   Kennieth Rad M 02/18/2017, 12:36 PM

## 2017-02-18 NOTE — Progress Notes (Signed)
Physical Therapy Session Note  Patient Details  Name: [name] MRN: [id number] Date of Birth: 1966/05/28  Today's Date: 02/18/2017 PT Individual Time: 4098-1191 PT Individual Time Calculation (min): 40 min    Skilled Therapeutic Interventions/Progress Updates:    Session initiated with pt lying in [place]ail bed.  Session focused on improving attention to task as well as promotion of functional mobility.  Pt perservated t/o session on removing his helmet and required significant cueing to allow helmet to remained donned.  Pt additionally noted to push strongly with R UE when attempted HHA with R UE.  Pt ambulated x 3 reps for 15 ft in hallway with rail and HHA.  Pt demonstrated poor safety with attempting to return to sit after walking.  Attempted to change tasks to work on sit stand with blocked practice, however, despite changing environment back to his room, pt refused to perform task and requested to be returned to bed.  Pt returned to bed, stating that he was fatigued.  Pt did require continual encouragement for participation during session today.  +2 assistance required due to pt impulsivity with transfers and helmet removal.  Pt returned to vail bed with appropriate precautions in place.  Pt denied pain.  Therapy Documentation Precautions:  Precautions Precautions: Fall, Other (comment) Precaution Comments: bone flap removed R side of head in abdomen; hemet for OOB; Pt no longer with NG or PICC; enclosure bed Required Braces or Orthoses: Other Brace/Splint Other Brace/Splint: helmet for OOB Restrictions Weight Bearing Restrictions: No General: PT Amount of Missed Time (min): 20 Minutes PT Missed Treatment Reason: Patient fatigue;Patient unwilling to participate  See Function Navigator for Current Functional Status.   Therapy/Group: Individual Therapy  Shantoya Geurts Elveria Rising 02/18/2017, 11:15 AM

## 2017-02-18 NOTE — Progress Notes (Signed)
[place] PHYSICAL MEDICINE & REHABILITATION     PROGRESS NOTE    Subjective/Complaints: Slept last night. No problems this morning.   ROS: Limited due cognitive/behavioral      Objective: Vital Signs: Blood pressure 102/60, pulse 80, temperature 98.2 F (36.8 C), temperature source Oral, resp. rate 18, height  (1.575 m), weight 59.4 kg (131 lb), SpO2 98 %. No results found. No results for input(s): WBC, HGB, HCT, PLT in the last 72 hours. No results for input(s): NA, K, CL, GLUCOSE, BUN, CREATININE, CALCIUM in the last 72 hours.  Invalid input(s): CO CBG (last 3)   Recent Labs  02/17/17 1635 02/17/17 2131 02/18/17 0627  GLUCAP 122* 187* 159*    Wt Readings from Last 3 Encounters:  02/18/17 59.4 kg (131 lb)  02/01/17 65.6 kg (144 lb 9.6 oz)  07/31/16 68 kg (150 lb)    Physical Exam:    Gen: NAD. Well developed.  HENT: Craniotomy site clean and dry with scab anteriorly--stable Cardiovascular:   RRR Respiratory:  CTA B GI: Soft. Bowel sounds are normal. Neurological: He is alert.  Delayed initiation. Left inattention  Psychiatric:   flat Skin. Warm and dry. Intact.   Assessment/Plan: 1. Cognitive and functional deficits secondary to right traumatic ICH/SDH s/p craniectomy which require 3+ hours per day of interdisciplinary therapy in a comprehensive inpatient rehab setting. Physiatrist is providing close team supervision and 24 hour management of active medical problems listed below. Physiatrist and rehab team continue to assess barriers to discharge/monitor patient progress toward functional and medical goals.  Function:  Bathing Bathing position   Position: Shower  Bathing parts Body parts bathed by patient: Right upper leg, Left upper leg, Abdomen, Chest, Left arm, Right arm, Front perineal area, Right lower leg, Left lower leg Body parts bathed by helper: Buttocks, Back  Bathing assist Assist Level: Touching or steadying assistance(Pt > 75%)       Upper Body Dressing/Undressing Upper body dressing   What is the patient wearing?: Pull over shirt/dress     Pull over shirt/dress - Perfomed by patient: Thread/unthread right sleeve, Pull shirt over trunk Pull over shirt/dress - Perfomed by helper: Thread/unthread left sleeve, Put head through opening        Upper body assist Assist Level:  (mod A)   Set up : To obtain clothing/put away  Lower Body Dressing/Undressing Lower body dressing   What is the patient wearing?: Pants, Non-skid slipper socks, Ted Hose     Pants- Performed by patient: Thread/unthread right pants leg, Thread/unthread left pants leg, Pull pants up/down Pants- Performed by helper: Thread/unthread left pants leg, Pull pants up/down Non-skid slipper socks- Performed by patient: Don/doff right sock Non-skid slipper socks- Performed by helper: Don/doff left sock               TED Hose - Performed by helper: Don/doff right TED hose, Don/doff left TED hose  Lower body assist Assist for lower body dressing: Touching or steadying assistance (Pt > 75%)      Toileting Toileting Toileting activity did not occur: Safety/medical concerns   Toileting steps completed by helper: Adjust clothing prior to toileting, Performs perineal hygiene, Adjust clothing after toileting Toileting Assistive Devices: Other (comment) (sara)  Toileting assist Assist level: Two helpers   Transfers Chair/bed transfer   Chair/bed transfer method: Stand pivot Chair/bed transfer assist level: Moderate assist (Pt 50 - 74%/lift or lower) Chair/bed transfer assistive device: Armrests Mechanical lift: Scientist, water quality activity did  not occur: Safety/medical concerns   Max distance: 30 ft Assist level: 2 helpers   Education administrator activity did not occur: Safety/medical concerns   Max wheelchair distance: 60' Assist Level: Maximal assistance (Pt 25 - 49%)  Cognition Comprehension Comprehension assist  level: Understands basic 25 - 49% of the time/ requires cueing 50 - 75% of the time  Expression Expression assist level: Expresses basic 50 - 74% of the time/requires cueing 25 - 49% of the time. Needs to repeat parts of sentences.  Social Interaction Social Interaction assist level: Interacts appropriately 25 - 49% of time - Needs frequent redirection.  Problem Solving Problem solving assist level: Solves basic less than 25% of the time - needs direction nearly all the time or does not effectively solve problems and may need a restraint for safety  Memory Memory assist level: Recognizes or recalls less than 25% of the time/requires cueing greater than 75% of the time  Medical Problem List and Plan: 1. 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 Explained to her that he has more healing to do and that he is not ready to return home at this time. However, he MAY go home without the flap replaced.   2. DVT Prophylaxis/Anticoagulation: Subcutaneous heparin. Monitor platelet counts and any signs of bleeding. 3. Pain Management: Hycet  4. Mood: Ativan as needed 5. Neuropsych: This patient is notcapable of making decisions on hisown behalf.  - improved patterns overall  -seroquel for sleep/agitation  -have stopped day time seroquel to avoid daytime sedation   -limiting benzos  -continue ritalin for arousal and initiation--increased to  on 4/12  -enclosure bed for safety still required--renewed  -soft helmet for safety 6. Skin/Wound Care: Routine skin checks  -helmet for cranium protection  -flap site  Intact  -crani site is clean   7. Fluids/Electrolytes/Nutrition:    -intake reasonable 8.Seizure prophylaxis. Keppra 750 mg twice daily 9.Pneumonia.  Five-day course of Ancef  completed 01/22/2017 10.Dysphagia. On D1 thins. Taking meds without issues   11.Urinary retention/Escherichia coli UTI.  Completed course of Cipro.   -emptying but incontinent  -continue urecholine 12.Diabetes mellitus with peripheral neuropathy. Hemoglobin A1c 10.4.  Patient on Glucophage 500 mg twice a day and Glucotrol 5 mg daily at home  -reasonable control  -lantus currently at 15u qhs  -no changes today 13.History of hypertension. Patient on lisinopril 2.5 mg daily prior to admission. Resume as needed  Under control 14.Constipation. Laxative assistance   LOS (Days) 17 A FACE TO FACE EVALUATION WAS PERFORMED  [name], MD 02/18/2017 9:04 AM

## 2017-02-18 NOTE — Progress Notes (Signed)
Occupational Therapy Session Note  Patient Details  Name: [name] MRN: [id number] Date of Birth: 1966/04/07  Today's Date: 02/18/2017 OT Individual Time: 4098-1191 OT Individual Time Calculation (min): 54 min    Short Term Goals: Week 3:  OT Short Term Goal 1 (Week 3): Pt will perform toileting with max A in order to decrease assistance with functional task.  OT Short Term Goal 2 (Week 3): Pt will utilize L UE in functional tasks with mod multimodal cues. OT Short Term Goal 3 (Week 3): Pt will initiate sit <>stand for LB clothing management with mod A.   Skilled Therapeutic Interventions/Progress Updates:    Upon entering the room, pt supine in bed resting. Pt with no signs or symptoms of pain this session. Pt donning pants in bed with min A. Pt performed supine >sit with min A to EOB after multiple attempts and max multimodal cues. Mod A stand pivot transfer into wheelchair from bed. OT propelled wheelchair to sink and pt brushed teeth and washed face with set up A and min verbal cues for initiation and sequence. OT assisted pt via wheelchair into day room. Pt engaged in sit <>stand at elevated table to pipe tree activity. Pt did not have patterns but given instructions to make something with pipes and connectors. Pt engaged in sit <>stand 8 reps this session ranging from min - mod A and mod A for cues. Pt standing each time less than 1 minutes before pushing posteriorly and sitting back into wheelchair. Pt was able to construct and deconstruct with use of pipe tree and utilizing B UE's for task. Pt returned to wheelchair and transferred back to bed at end of session with min A cues for initiation and mod lifting assistance. Sit>supine with min A for balance. Bed secured and OT exited the room.   Therapy Documentation Precautions:  Precautions Precautions: Fall, Other (comment) Precaution Comments: bone flap removed R side of head in abdomen; hemet for OOB; Pt no longer with NG  or PICC; enclosure bed Required Braces or Orthoses: Other Brace/Splint Other Brace/Splint: helmet for OOB Restrictions Weight Bearing Restrictions: No General:   Vital Signs: Therapy Vitals Temp: 98.1 F (36.7 C) Temp Source: Oral Pulse Rate: 89 Resp: 18 BP: 114/75 Patient Position (if appropriate): Lying Oxygen Therapy SpO2: 100 % O2 Device: Not Delivered Pain: Pain Assessment Pain Assessment: No/denies pain ADL:   Vision/Perception     Exercises:   Other Treatments:    See Function Navigator for Current Functional Status.   Therapy/Group: Individual Therapy  [name] 02/18/2017, 4:54 PM

## 2017-02-18 NOTE — Patient Care Conference (Signed)
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 Essential hypertension   . Agitated   . Lethargy   . Benign essential HTN   . Poorly controlled type 2 diabetes mellitus with peripheral neuropathy (HCC)   . 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 Aphasia 02/01/2017  . Dysphagia, oropharyngeal 02/01/2017  . Intracerebral hemorrhage (HCC) 01/14/2017  . Fall 01/10/2017  . Subdural hemorrhage (HCC) 01/10/2017  . Diabetes type 2, uncontrolled (HCC) 06/13/2015    Expected Discharge Date: Expected Discharge Date: 03/09/17  Team Members Present: Physician leading conference: Dr. Ferman Hamming, PsyD Social Worker Present: Jamie Jupiter, LCSW Nurse Present: Jamie End, RN PT Present: [name], PT;[name], PT OT Present: [name], OT SLP Present: [name], SLP PPS Coordinator present : Jamie Duck, RN, CRRN     Current Status/Progress Goal Weekly Team Focus  Medical   continues to slowly progress. sleep patterns better. on diet. in enclosure bed for safet  improve engagement,cognition  nutrition, diabetes control, safety   Bowel/Bladder   incontinent of B and B; in and out cath q 8 ;          Swallow/Nutrition/ Hydration   supervision; dys 1 thin  Min A with least restrictive diet   carryover of portion control and pacing and timley oral clearance    ADL's   UB self care min - mod A, LB self care total A, sit <>stand total A, functional transfers max A - +2 total for toileting  Min-Mod A  ADL retraining, cognition, sitting balance/standing, attention, initiation, following commands, family education, NMR   Mobility   2+ assist  min assist overall  NMR, gait, transfers, balance, cognitive remediation, bed mobility   Communication   delayed response; needs interpreter  Min A  ability to express basic wants and needs as well as follow commands    Safety/Cognition/ Behavioral Observations  Max-Total A  Min A  orientation, initiaiton, sustained attention    Pain             Skin   s/p crani wound - head healing ; pt . scratches at times          Rehab Goals Patient on target to meet rehab goals: Yes *See Care Plan and progress notes for long and short-term goals.  Barriers to Discharge: decreased awareness, left visual-spatial deficits    Possible Resolutions to Barriers:  continued NMR, cognitive remediation    Discharge Planning/Teaching Needs:  Plan for pt to d/c home with sister and her family.  Sister to be primary caregiver and aware need for 24/7 support.  Teaching to be ongoing.   Team Discussion:  Behavior improving.  Improved po and eating 100% meals.  Cont of  b/b today!  Still resistent to putting weight through legs, but did one time with OT today.  Poor initiation but less restless and improving nicely overall.  Revisions to Treatment Plan:  None   Continued Need for Acute Rehabilitation Level of Care: The patient requires daily medical management by a physician with specialized training in physical medicine and rehabilitation for the following conditions: Daily direction of a multidisciplinary physical rehabilitation program to ensure safe treatment while eliciting the highest outcome that is of practical value to the patient.: Yes Daily medical  management of patient stability for increased activity during participation in an intensive rehabilitation regime.: Yes Daily analysis of laboratory values and/or radiology reports with any subsequent need for medication adjustment of medical intervention for : Post surgical problems;Wound care problems;Neurological problems;Mood/behavior problems;Nutritional problems  [name] 02/19/2017, 8:47 AM

## 2017-02-18 NOTE — Progress Notes (Signed)
Social Work Patient ID: [name], male   DOB: 27-May-1966, 51 y.o.   MRN: [id number]   Met with pt's sister today to review team conference.  She continues to be very tearful each time we speak about pt and his anticipated care needs at d/c.  She is aware that we have targeted a d/c date of 5/8 with minimal assist goals overall.  She is very concerned about being able to provide needed 24/7 care for pt while also needing to provide support to her children "and drive them places everyday".  Explained that we hope he will continue to make progress and she could possibly take him with her when needing to leave the home.  Unfortunately, pt is not a legal citizen and will not be eligible for any Medicaid coverage.  Sister understands this yet also asking about possibility of "another place...just for a couple of months?"  I, again, tried to explain that we do not have a payor for another facility/ SNF.  I did question if there is more family in [place] then could someone come here or pt go there for further assistance?  Sister reports that the family in [place] could not provide needed care.  Sister is to meet with [employer] this afternoon to see if this program might be able to offer some support in any way.  Very difficult situation with little support options available in the community.  OT does report that pt having a very good day today and hopeful his progress will continue to improve at this rate.  Will follow up with sister tomorrow to see if [employer] able to offer any support to the situation.  Continue to follow.  Graison Leinberger, LCSW

## 2017-02-18 NOTE — Progress Notes (Signed)
Speech Language Pathology Daily Session Note  Patient Details  Name: [name] MRN: [id number] Date of Birth: [birth year]  Today's Date: 02/18/2017 SLP Individual Time: 1345-1445 SLP Individual Time Calculation (min): 60 min  Short Term Goals: Week 3: SLP Short Term Goal 1 (Week 3): Patient will consume current diet with minimal overt s/s of aspiration with Mod A verbal cues for use of swallowing compensatory strategies.  SLP Short Term Goal 2 (Week 3): Patient will demonstrate sustained attention to a functional task for ~2 minutes with Mod A verbal cues for redirection.  SLP Short Term Goal 3 (Week 3): Patient will orient to place, situation and time with Mod A multimodal cues.  SLP Short Term Goal 4 (Week 3): Patient will verbally respond to questions in 80% of opportunities with Max A multimodal cues.  SLP Short Term Goal 5 (Week 3): Patient will demonstrate basic problem solving for basic tasks with Mod A verbal cues.   Skilled Therapeutic Interventions: Skilled treatment session focused on cognition goals. SLP facilitated session by help of interpreter by providing [facility] questions to recall hobbies and provide information related to hobbies to possibly interface hobbies into therapy sessions to increase interest in therapy.  Pt required Mod A when answering WH questions related to history with wrestling. Pt with increased sustained attention and concise language organization when answering questions. Pt required supervision cues to sustain attention for ~ 45 minutes. Pt required Max A to count money d/t difficulty perceiving dime vs quarter (no real quarter present for comparison). Pt was returned to room, left in enclosure bed with nursing present. Continue per current plan of care.      Function:  Eating Eating Eating activity did not occur: Safety/medical concerns Modified Consistency Diet: Yes Eating Assist Level: Helper checks for pocketed food;Supervision or verbal  cues;Set up assist for;Help with picking up utensils;Help managing cup/glass   Eating Set Up Assist For: Opening containers Helper Scoops Food on Utensil: Occasionally Helper Brings Food to Mouth: Occasionally   Cognition Comprehension Comprehension assist level: Understands basic 50 - 74% of the time/ requires cueing 25 - 49% of the time  Expression   Expression assist level: Expresses basic 50 - 74% of the time/requires cueing 25 - 49% of the time. Needs to repeat parts of sentences.  Social Interaction Social Interaction assist level: Interacts appropriately 25 - 49% of time - Needs frequent redirection.  Problem Solving Problem solving assist level: Solves basic less than 25% of the time - needs direction nearly all the time or does not effectively solve problems and may need a restraint for safety  Memory Memory assist level: Recognizes or recalls less than 25% of the time/requires cueing greater than 75% of the time    Pain Pain Assessment Pain Assessment: No/denies pain  Therapy/Group: Individual Therapy  Lynora Dymond 02/18/2017, 3:44 PM

## 2017-02-18 NOTE — Progress Notes (Signed)
Occupational Therapy Session Note  Patient Details  Name: [name] MRN: [id number] Date of Birth: Apr 14, 1966  Today's Date: 02/18/2017 OT Individual Time: 1105-1200 OT Individual Time Calculation (min): 55 min    Short Term Goals: Week 3:  OT Short Term Goal 1 (Week 3): Pt will perform toileting with max A in order to decrease assistance with functional task.  OT Short Term Goal 2 (Week 3): Pt will utilize L UE in functional tasks with mod multimodal cues. OT Short Term Goal 3 (Week 3): Pt will initiate sit <>stand for LB clothing management with mod A.   Skilled Therapeutic Interventions/Progress Updates:    Pt seen this session to facilitate initiation, attention, LUE AROM, functional mobility. Translator present.  Pt needed max to total cues for all 1 step directions and max to total A for mobility. It took 40 min to get out of bed as pt was constantly resisting stating that it was "too late", " he needed to get going" but then would resist getting up. Pt finally allowed therapist to transfer him to w/c. Went to gym to work on sit to stand at parallel bars and work on UE AROM with arm bike and weighted dowel BUT with 20 minutes of coaxing pt did NOT participate.  He continually said nothing would work unless he was moving his entire body at once.  Pt taken to nursing station for lunch with quick release belt on.    Therapy Documentation Precautions:  Precautions Precautions: Fall, Other (comment) Precaution Comments: bone flap removed R side of head in abdomen; hemet for OOB; Pt no longer with NG or PICC; enclosure bed Required Braces or Orthoses: Other Brace/Splint Other Brace/Splint: helmet for OOB Restrictions Weight Bearing Restrictions: No    Pain: Pain Assessment Pain Assessment: No/denies pain ADL:  See Function Navigator for Current Functional Status.   Therapy/Group: Individual Therapy  SAGUIER,JULIA 02/18/2017, 1:17 PM

## 2017-02-19 ENCOUNTER — Inpatient Hospital Stay (HOSPITAL_COMMUNITY): Payer: Self-pay | Admitting: Occupational Therapy

## 2017-02-19 ENCOUNTER — Inpatient Hospital Stay (HOSPITAL_COMMUNITY): Payer: Self-pay | Admitting: Physical Therapy

## 2017-02-19 ENCOUNTER — Inpatient Hospital Stay (HOSPITAL_COMMUNITY): Payer: Self-pay | Admitting: Speech Pathology

## 2017-02-19 LAB — GLUCOSE, CAPILLARY
GLUCOSE-CAPILLARY: 81 mg/dL (ref 65–99)
GLUCOSE-CAPILLARY: 121 mg/dL — AB (ref 65–99)
GLUCOSE-CAPILLARY: 110 mg/dL — AB (ref 65–99)
Glucose-Capillary: 161 mg/dL — ABNORMAL HIGH (ref 65–99)

## 2017-02-19 NOTE — Progress Notes (Signed)
Physical Therapy Session Note  Patient Details  Name: [name] MRN: [id number] Date of Birth: [birth year]  Today's Date: 02/19/2017 PT Individual Time: 4098-1191 PT Individual Time Calculation (min): 50 min   Skilled Therapeutic Interventions/Progress Updates:    Session initiated this afternoon with pt seated in wheelchair.  Attempted activities in gym, however, pt requires max A + 2 to transfer when overstimulated in gym environment.  Attempted seated activity with tray table, however, pt initiated activity by placing objects in his mouth.  Upon redirection, pt was able to sit upright in busy gym and name animals in spanish.  Pt then transferred to [facility] gym where session focused on utilization of procedural memory to facilitate sit to stand transfers.  Pt reached for object (checker) with right hand to facilitate trunk flexion at which pt therapist was able to provide mod assist to facilitate standing.  Pt was then able to reach to place checkers high on checker board to facilitate R sided weight shift.  Pt does impulsively return to sitting and occasionally perseverates on left hand and requires redirection.  Pt does deny pain, however, removes helmet on a consistent basis and requires max assist to return helmet back to head for safety.  Following session, pt returned to [place] bed with bed secured appropriately.  Pt fatigued following above and requested not to use bathroom but instead to return to bed.    Therapy Documentation Precautions:  Precautions Precautions: Fall, Other (comment) Precaution Comments: bone flap removed R side of head in abdomen; hemet for OOB; Pt no longer with NG or PICC; enclosure bed Required Braces or Orthoses: Other Brace/Splint Other Brace/Splint: helmet for OOB Restrictions Weight Bearing Restrictions: No General: PT Amount of Missed Time (min): 10 Minutes PT Missed Treatment Reason: Patient fatigue   See Function Navigator for Current Functional  Status.   Therapy/Group: Individual Therapy  [name] 02/19/2017, 12:21 PM

## 2017-02-19 NOTE — Progress Notes (Signed)
[place] PHYSICAL MEDICINE & REHABILITATION     PROGRESS NOTE    Subjective/Complaints: Slept last night. No problems this morning.   ROS: Limited due cognitive/behavioral      Objective: Vital Signs: Blood pressure 121/71, pulse 81, temperature 98.3 F (36.8 C), temperature source Oral, resp. rate 18, height  (1.575 m), weight 58.2 kg (128 lb 4.9 oz), SpO2 99 %. No results found. No results for input(s): WBC, HGB, HCT, PLT in the last 72 hours. No results for input(s): NA, K, CL, GLUCOSE, BUN, CREATININE, CALCIUM in the last 72 hours.  Invalid input(s): CO CBG (last 3)   Recent Labs  02/18/17 1631 02/18/17 2107 02/19/17 0640  GLUCAP 125* 117* 81    Wt Readings from Last 3 Encounters:  02/19/17 58.2 kg (128 lb 4.9 oz)  02/01/17 65.6 kg (144 lb 9.6 oz)  07/31/16 68 kg (150 lb)    Physical Exam:    Gen: NAD. Well developed.  HENT: Craniotomy site clean and dry with scab anteriorly--stable Cardiovascular:   RRR Respiratory:  CTA B GI: Soft. Bowel sounds are normal. Neurological: He is alert.  Delayed initiation. Left inattention. Doesn't engage as quickly with the left arm/leg Psychiatric:   Flat but cooperative Skin. Warm and dry. Intact.   Assessment/Plan: 1. Cognitive and functional deficits secondary to right traumatic ICH/SDH s/p craniectomy which require 3+ hours per day of interdisciplinary therapy in a comprehensive inpatient rehab setting. Physiatrist is providing close team supervision and 24 hour management of active medical problems listed below. Physiatrist and rehab team continue to assess barriers to discharge/monitor patient progress toward functional and medical goals.  Function:  Bathing Bathing position   Position: Shower  Bathing parts Body parts bathed by patient: Right upper leg, Left upper leg, Abdomen, Chest, Left arm, Right arm, Front perineal area, Right lower leg, Left lower leg Body parts bathed by helper: Buttocks, Back   Bathing assist Assist Level: Touching or steadying assistance(Pt > 75%)      Upper Body Dressing/Undressing Upper body dressing   What is the patient wearing?: Pull over shirt/dress     Pull over shirt/dress - Perfomed by patient: Thread/unthread right sleeve, Pull shirt over trunk Pull over shirt/dress - Perfomed by helper: Thread/unthread left sleeve, Put head through opening        Upper body assist Assist Level:  (mod A)   Set up : To obtain clothing/put away  Lower Body Dressing/Undressing Lower body dressing   What is the patient wearing?: Pants, Non-skid slipper socks, Ted Hose     Pants- Performed by patient: Thread/unthread right pants leg, Thread/unthread left pants leg, Pull pants up/down Pants- Performed by helper: Thread/unthread left pants leg, Pull pants up/down Non-skid slipper socks- Performed by patient: Don/doff right sock Non-skid slipper socks- Performed by helper: Don/doff left sock               TED Hose - Performed by helper: Don/doff right TED hose, Don/doff left TED hose  Lower body assist Assist for lower body dressing: Touching or steadying assistance (Pt > 75%)      Toileting Toileting Toileting activity did not occur: Safety/medical concerns Toileting steps completed by patient: Performs perineal hygiene Toileting steps completed by helper: Adjust clothing prior to toileting, Adjust clothing after toileting Toileting Assistive Devices: Grab bar or rail  Toileting assist Assist level: Touching or steadying assistance (Pt.75%)   Transfers Chair/bed transfer   Chair/bed transfer method: Stand pivot Chair/bed transfer assist level: Moderate assist (Pt  50 - 74%/lift or lower) Chair/bed transfer assistive device: Armrests Mechanical lift: Scientist, water quality activity did not occur: Safety/medical concerns   Max distance: 40 Assist level: 2 helpers   Education administrator activity did not occur: Safety/medical  concerns   Max wheelchair distance: 60' Assist Level: Maximal assistance (Pt 25 - 49%)  Cognition Comprehension Comprehension assist level: Understands basic 50 - 74% of the time/ requires cueing 25 - 49% of the time  Expression Expression assist level: Expresses basic 50 - 74% of the time/requires cueing 25 - 49% of the time. Needs to repeat parts of sentences.  Social Interaction Social Interaction assist level: Interacts appropriately 25 - 49% of time - Needs frequent redirection.  Problem Solving Problem solving assist level: Solves basic less than 25% of the time - needs direction nearly all the time or does not effectively solve problems and may need a restraint for safety  Memory Memory assist level: Recognizes or recalls less than 25% of the time/requires cueing greater than 75% of the time  Medical Problem List and Plan: 1. 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 DVT Prophylaxis/Anticoagulation: Subcutaneous heparin. Monitor platelet counts and any signs of bleeding. 3. Pain Management: Hycet  4. Mood: Ativan as needed 5. Neuropsych: This patient is notcapable of making decisions on hisown behalf.  - improved patterns overall  -HS seroquel for sleep/agitation  -have stopped day time seroquel to avoid daytime sedation   -limiting benzos  -continue ritalin for arousal and initiation-  bid  -enclosure bed for safety still required--renewed  -soft helmet for safety 6. Skin/Wound Care: Routine skin checks  -helmet for cranium protection  -flap site  Intact  -crani site is clean   7. Fluids/Electrolytes/Nutrition:    -intake reasonable 8.Seizure prophylaxis. Keppra 750 mg twice daily 9.Pneumonia. Five-day course of Ancef  completed 01/22/2017 10.Dysphagia.  On D1 thins. Taking meds without issues   11.Urinary retention/Escherichia coli UTI.  Completed course of Cipro.   -emptying more often on his own but incontinent  -continue urecholine  -timed voids 12.Diabetes mellitus with peripheral neuropathy. Hemoglobin A1c 10.4.  Patient on Glucophage 500 mg twice a day and Glucotrol 5 mg daily at home  -reasonable control  -lantus currently at 15u qhs  -no changes today 13.History of hypertension. Patient on lisinopril 2.5 mg daily prior to admission. Resume as needed  Under control 14.Constipation. Laxative assistance   LOS (Days) 18 A FACE TO FACE EVALUATION WAS PERFORMED  [name], MD 02/19/2017 9:31 AM

## 2017-02-19 NOTE — Progress Notes (Signed)
Physical Therapy Session Note  Patient Details  Name: [name] MRN: [id number] Date of Birth: 08/04/1966  Today's Date: 02/19/2017 PT Individual Time: 1300-1427 PT Individual Time Calculation (min): 87 min   Short Term Goals: Week 3:  PT Short Term Goal 1 (Week 3): Pt will stand with max assist +1.  PT Short Term Goal 2 (Week 3): Pt will intiate all functional mobility tasks 50% of the time. PT Short Term Goal 3 (Week 3): Pt will ambulate 10 ft with max assist +1.  Skilled Therapeutic Interventions/Progress Updates: Pt received supine in veil bed; denies pain and no evidence of pain throughout session. Pt perseverative on removing helmet throughout session requiring repetitive redirection and education. Pt reporting via translator "It has no function, the holes in it will hurt my head, I want to put it in the trash"; improved tolerance with washcloth on top of head between head and helmet as discomfort is suspected due to healing incision irritated by helmet. Pt propels w/c with RUE; unable to utilize LUE on w/c despite max multimodal cueing, however pt does initiate reaching LUE for hall rail and assisting with propulsion and navigation by pulling on rail. Pt agreeable to ambulate, however when attempting to perform, unable to initiate task despite max cueing and various methods for helping initiate anterior weight shift. Performed sit <>stand in BI gym with instruction to place checkers in bag; 4 reps of standing with min/modA and facilitation for R lateral weight shift and RLE weight bearing. Pt engaged in bimanual task stringing beads; LUE perseverative on previously threaded bead and unable to release grasp to assist with threading new bead. Pt then perseverative on threading string through already threaded bead. Sit <>stand x3 trials with RUE on chair while attempting to engage in putt putt with BUE; pt unwilling to release RUE from chair to assist with task. Gait x2 trials of 9'  with R rail and modA; chair placed in front of pt for visual target, and gradually moved backward to increase gait distance per pt tolerance. Followed pt's lead to allow him to sit when he felt ready to rest. ModA stand pivot transfer to return to bed with pt impulsively lying straight back in bed with legs hanging out. Required max cues and modA to reorient in bed. Remained in bed with RN present, all needs in reach.      Therapy Documentation Precautions:  Precautions Precautions: Fall, Other (comment) Precaution Comments: bone flap removed R side of head in abdomen; hemet for OOB; Pt no longer with NG or PICC; enclosure bed Required Braces or Orthoses: Other Brace/Splint Other Brace/Splint: helmet for OOB Restrictions Weight Bearing Restrictions: No   See Function Navigator for Current Functional Status.   Therapy/Group: Individual Therapy  Vista Lawman 02/19/2017, 2:29 PM

## 2017-02-19 NOTE — Progress Notes (Signed)
Speech Language Pathology Daily Session Note  Patient Details  Name: [name] MRN: [id number] Date of Birth: [birth year]  Today's Date: 02/19/2017 SLP Individual Time: 0800-0900 SLP Individual Time Calculation (min): 60 min  Short Term Goals: Week 3: SLP Short Term Goal 1 (Week 3): Patient will consume current diet with minimal overt s/s of aspiration with Mod A verbal cues for use of swallowing compensatory strategies.  SLP Short Term Goal 2 (Week 3): Patient will demonstrate sustained attention to a functional task for ~2 minutes with Mod A verbal cues for redirection.  SLP Short Term Goal 3 (Week 3): Patient will orient to place, situation and time with Mod A multimodal cues.  SLP Short Term Goal 4 (Week 3): Patient will verbally respond to questions in 80% of opportunities with Max A multimodal cues.  SLP Short Term Goal 5 (Week 3): Patient will demonstrate basic problem solving for basic tasks with Mod A verbal cues.   Skilled Therapeutic Interventions: Skilled treatment session focused on dysphagia and cognitive goals. SLP facilitated session by providing Mod A verbal and visual cues for use of small bites/sips, a slow rate of self-feeding and to clear right pocketing with breakfast meal of Dys. 1 textures with thin liquids via cup. Patient consumed meal without overt s/s of aspiration. Patient also required Max A verbal and visual cues for orientation to time, place and situation in 100% of opportunities. Patient transferred to enclosure bed at end of session. Continue with current plan of care.      Function:  Eating Eating   Modified Consistency Diet: Yes Eating Assist Level: Helper checks for pocketed food;Supervision or verbal cues;Set up assist for           Cognition Comprehension Comprehension assist level: Understands basic 50 - 74% of the time/ requires cueing 25 - 49% of the time  Expression   Expression assist level: Expresses basic 50 - 74% of the  time/requires cueing 25 - 49% of the time. Needs to repeat parts of sentences.  Social Interaction Social Interaction assist level: Interacts appropriately 25 - 49% of time - Needs frequent redirection.  Problem Solving Problem solving assist level: Solves basic less than 25% of the time - needs direction nearly all the time or does not effectively solve problems and may need a restraint for safety  Memory Memory assist level: Recognizes or recalls less than 25% of the time/requires cueing greater than 75% of the time    Pain No/Denies Pain   Therapy/Group: Individual Therapy  Milessa Hogan 02/19/2017, 3:19 PM

## 2017-02-19 NOTE — Progress Notes (Signed)
Occupational Therapy Session Note  Patient Details  Name: [name] MRN: [id number] Date of Birth: [birth year]  Today's Date: 02/19/2017 OT Individual Time: 0926-1100 OT Individual Time Calculation (min): 94 min    Short Term Goals: Week 1:  OT Short Term Goal 1 (Week 1): Pt will sustain attention to self-care task for 2 mins OT Short Term Goal 1 - Progress (Week 1): Progressing toward goal OT Short Term Goal 2 (Week 1): Pt will initiate self-care task with mod cues OT Short Term Goal 2 - Progress (Week 1): Progressing toward goal OT Short Term Goal 3 (Week 1): Pt will complete bathing with max assist OT Short Term Goal 3 - Progress (Week 1): Met OT Short Term Goal 4 (Week 1): Pt will maintain sitting balance with min assist for 5 mins to engage in self-care task OT Short Term Goal 4 - Progress (Week 1): Not met OT Short Term Goal 5 (Week 1): Pt will complete toilet transfer with max assist of 1 caregiver OT Short Term Goal 5 - Progress (Week 1): Not met Week 2:  OT Short Term Goal 1 (Week 2): Pt will perform toilet transfer with max A of 1 in order to decrease level of assistance needed. OT Short Term Goal 1 - Progress (Week 2): Progressing toward goal OT Short Term Goal 2 (Week 2): Pt will maintain sitting balance with mod A or less for 3 minutes during functional task. OT Short Term Goal 2 - Progress (Week 2): Met OT Short Term Goal 3 (Week 2): Pt will sustain attention during bathing and dressing for 2 minutes with mod multimodal cues. OT Short Term Goal 3 - Progress (Week 2): Met Week 3:  OT Short Term Goal 1 (Week 3): Pt will perform toileting with max A in order to decrease assistance with functional task.  OT Short Term Goal 2 (Week 3): Pt will utilize L UE in functional tasks with mod multimodal cues. OT Short Term Goal 3 (Week 3): Pt will initiate sit <>stand for LB clothing management with mod A.   Skilled Therapeutic Interventions/Progress Updates:    Pt  engaged in the following:  Bed mobility, sit to stand, transfers to wc shower, sofa, following directions.  PPt lying in bed with sister persent.  Pt verbalized thata he wanted to shower.  Performed supine to sit with verbal cues and min assist.  Sat EOB with min assist for 5 mins.  Transferred to wc with mod assist with stand pivot.  Pt brushed teeth at sink with min assist and maintained sustained attention for 5 minutes.   Transferred to shower bench with manual cues and mod assist for sit to stand and stand pivot to TTB.  Pt maintained attention to bathing BUE, and torso, and peri area and then verbalized he was done.  Provided verbal cues for other body parts to be washed, then hand over hand assist.  Pt was done and ready to get out..  Transferred to wc with increased time to motor plan and process gesture cues as well as verbal cues.  Dressed in wc.  Went to day room and transferred to sofa and back to wc with max assist and increased time.  Placed pt in room with safety belt and rehab tech.    Therapy Documentation Precautions:  Precautions Precautions: Fall, Other (comment) Precaution Comments: bone flap removed R side of head in abdomen; hemet for OOB; Pt no longer with NG or PICC; enclosure bed Required Braces or Orthoses:  Other Brace/Splint Other Brace/Splint: helmet for OOB Restrictions Weight Bearing Restrictions: No       Pain: none   See Function Navigator for Current Functional Status.   Therapy/Group: Individual Therapy  Lisa Roca 02/19/2017, 11:41 AM

## 2017-02-20 ENCOUNTER — Inpatient Hospital Stay (HOSPITAL_COMMUNITY): Payer: Self-pay

## 2017-02-20 LAB — GLUCOSE, CAPILLARY
GLUCOSE-CAPILLARY: 104 mg/dL — AB (ref 65–99)
GLUCOSE-CAPILLARY: 109 mg/dL — AB (ref 65–99)
GLUCOSE-CAPILLARY: 210 mg/dL — AB (ref 65–99)
Glucose-Capillary: 134 mg/dL — ABNORMAL HIGH (ref 65–99)

## 2017-02-20 NOTE — Progress Notes (Signed)
Patient ID: [name], male   DOB: 1966/07/10, 51 y.o.   MRN: [id number]   02/20/17.  [name] is a 51 y.o. 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 Diabetes mellitus without complication (HCC)     Subjective: No new complaints. No new problems.  Remains in enclosure bed  Objective: Vital signs in last 24 hours: Temp:  [98 F (36.7 C)-98.2 F (36.8 C)] 98 F (36.7 C) (04/21 0506) Pulse Rate:  [84-86] 86 (04/21 0506) Resp:  [18] 18 (04/21 0506) BP: (118-120)/(66-68) 120/68 (04/21 0506) SpO2:  [92 %-98 %] 98 % (04/21 0506) Weight:  [129 lb 13.6 oz (58.9 kg)] 129 lb 13.6 oz (58.9 kg) (04/21 0506) Weight change: 1 lb 8.7 oz (0.7 kg) Last BM Date: 02/18/17  Intake/Output from previous day: 04/20 0701 - 04/21 0700 In: 600 [P.O.:600] Out: 1300 [Urine:1300] Last cbgs: CBG (last 3)   Recent Labs  02/19/17 1620 02/19/17 2034 02/20/17 0637  GLUCAP 110* 161* 104*   Lab Results  Component Value Date   HGBA1C 10.4 (H) 01/10/2017    Physical Exam General: No apparent distress   HEENT: not dry Lungs: Normal effort. Lungs clear to auscultation, no crackles or wheezes. Cardiovascular: Regular rate and rhythm, no edema Abdomen: S/NT/ND; BS(+) Musculoskeletal:  unchanged Neurological: No new neurological deficits; simple one word responses  Skin: clear  .  Craniotomy site clean Mental state: Alert, oriented, cooperative  BP Readings from Last 3 Encounters:  02/20/17 120/68  02/01/17 117/70  07/31/16 116/72    Lab Results: BMET    Component Value Date/Time   NA 141 02/15/2017 0544   K 3.9 02/15/2017 0544   CL 105 02/15/2017 0544   CO2 25 02/15/2017 0544   GLUCOSE 123 (H) 02/15/2017 0544   BUN 10 02/15/2017 0544   CREATININE 0.62 02/15/2017  0544   CREATININE 0.71 06/02/2016 1232   CALCIUM 9.0 02/15/2017 0544   GFRNONAA >60 02/15/2017 0544   GFRAA >60 02/15/2017 0544   CBC    Component Value Date/Time   WBC 4.6 02/15/2017 0544   RBC 4.07 (L) 02/15/2017 0544   HGB 11.7 (L) 02/15/2017 0544   HCT 35.0 (L) 02/15/2017 0544   PLT 288 02/15/2017 0544   MCV 86.0 02/15/2017 0544   MCH 28.7 02/15/2017 0544   MCHC 33.4 02/15/2017 0544   RDW 13.8 02/15/2017 0544   LYMPHSABS 0.8 02/04/2017 0428   MONOABS 0.2 02/04/2017 0428   EOSABS 0.1 02/04/2017 0428   BASOSABS 0.0 02/04/2017 0428    Medications: I have reviewed the patient's current medications.  Assessment/Plan:  Cognitive and functional deficits secondary to traumatic.  ICH/SDH.  Status post craniectomy Seizure prophylaxis.  Continue Keppra 750 twice a day DM2- nice glycemic control H/O HTN- nice BP control; continue to monitor    Length of stay, days: 19  Rogelia Boga , MD 02/20/2017, 9:50 AM

## 2017-02-20 NOTE — Progress Notes (Signed)
Occupational Therapy Session Note  Patient Details  Name: [name] MRN: [id number] Date of Birth: 19-Nov-1965  Today's Date: 02/20/2017 OT Individual Time: 1000-1100 OT Individual Time Calculation (min): 60 min    Short Term Goals: Week 3:  OT Short Term Goal 1 (Week 3): Pt will perform toileting with max A in order to decrease assistance with functional task.  OT Short Term Goal 2 (Week 3): Pt will utilize L UE in functional tasks with mod multimodal cues. OT Short Term Goal 3 (Week 3): Pt will initiate sit <>stand for LB clothing management with mod A.   Skilled Therapeutic Interventions/Progress Updates: ADL-retraining with focus on family education and training (sister present) on assisted BADL, transfers and mobility.   Sister functioned as +2 helper during session and as interpreted with interpreter present.  OT educated sister on use of minimal cues to reduce confusion.   Pt was able to rise to sit at edge of enclosure bed within 4 minutes of cueing with sister assisting.   Pt rose to stand supported by his sister on his right and OT on his left with OT providing simple marching commands ("left foot" "right foot").   Pt performed stand pivot transfer to toilet with BSC over toilet and use of grab bars with overall mod assist of 1 and helper standing by for safety.   Pt completed similar transfer to tub bench, walking to bench from toilet but required max assist to recover to bench d/t confusion with directions.  Pt bathed seated on bench with overall min assist, and steadying assist while standing supported using grab bar.   Pt dressed in with similar assist and extra time while remaining seated on tub bench.   Pt completed transfer to w/c and was positioned at sink for assist with grooming.   OT completed shaving pt for 7 minutes with pt following cues as assisted by interpreter.  Pt required repeated vc to inhibit use of left hand and to refrain from scratching his head.   Pt  recovered to bed at end of session with sister present, overall mod assist X 1 for transfer.     Therapy Documentation Precautions:  Precautions Precautions: Fall, Other (comment) Precaution Comments: bone flap removed R side of head in abdomen; hemet for OOB; Pt no longer with NG or PICC; enclosure bed Required Braces or Orthoses: Other Brace/Splint Other Brace/Splint: helmet for OOB Restrictions Weight Bearing Restrictions: No  Pain: Pain Assessment Pain Assessment: No/denies pain  See Function Navigator for Current Functional Status.   Therapy/Group: Individual Therapy   Second session: Time: 1300-1330 Time Calculation (min):  30 min  Pain Assessment: No/denies pain.  Skilled Therapeutic Interventions: Therapeutic activity with focus on LUE NMR, static standing, dynamic sitting.   Pt received seated in w/c, self-feeding w/RN supervision.   Pt completed self-feeding with setup and supervision.   Pt was then presented with RW placed against wall for stability to attempt sit<>stand and static standing w/o goal of ambulating.   Pt completed sit<>stand 3 times with mod assist although resistant to verbal and tactile cues for hand placement on w/c.   Pt demo'd increased confusion during training resulting in change of plan to address LUE NMR.  Pt was escorted to [facility] in his w/c.   Using dynavision, pt completed 3 60 second trials to improve gross LUE FMC while reacting to stimuli.   With right hand restrained pt completed reach and touch with left hand 7 times consecutively with mod  vc to attend to stimuli presented.   Pt demo'd decreased tolerance to task after 8 minutes and returned to his room to groom at sink, brushing his teeth with only setup assist to apply toothpaste.   Pt requested return to bed d/t fatigue 15 min prior to end of session.       See FIM for current functional status  Therapy/Group: Individual Therapy  [name] 02/20/2017, 12:34 PM

## 2017-02-21 ENCOUNTER — Inpatient Hospital Stay (HOSPITAL_COMMUNITY): Payer: Self-pay

## 2017-02-21 LAB — GLUCOSE, CAPILLARY
GLUCOSE-CAPILLARY: 130 mg/dL — AB (ref 65–99)
Glucose-Capillary: 91 mg/dL (ref 65–99)
Glucose-Capillary: 134 mg/dL — ABNORMAL HIGH (ref 65–99)

## 2017-02-21 MED ORDER — WHITE PETROLATUM GEL
Status: AC
  Administered 2017-02-21: 13:00:00
  Filled 2017-02-21: qty 1

## 2017-02-21 MED ORDER — WHITE PETROLATUM GEL
Status: AC
  Filled 2017-02-21: qty 1

## 2017-02-21 NOTE — Progress Notes (Signed)
Occupational Therapy Session Note  Patient Details  Name: [name] MRN: [id number] Date of Birth: Jun 24, 1966  Today's Date: 02/21/2017 OT Individual Time: 4098-1191 OT Individual Time Calculation (min): 34 min   Short Term Goals: Week 3:  OT Short Term Goal 1 (Week 3): Pt will perform toileting with max A in order to decrease assistance with functional task.  OT Short Term Goal 2 (Week 3): Pt will utilize L UE in functional tasks with mod multimodal cues. OT Short Term Goal 3 (Week 3): Pt will initiate sit <>stand for LB clothing management with mod A.   Skilled Therapeutic Interventions/Progress Updates: Therapeutic activity with focus on functional transfers, improved BLE coordination, static standing.   Pt received in w/c with family present.  Pt requested assist with change of shirt and transfer to toilet.  Pt removed helmet d/t irritation and unwilling to reapply.   OT educated pt's sister on assisted dressing to reduce effect of alien hand (LUE); pt required mod assist to don shirt.   Pt was escorted to toilet in w/c and assist to [facility] over toilet (mod assist d/t impaired standing balance).   Pt attempted BM but was unsuccessful at voiding BM or urin after 5 min.   Pt returned to w/c after assist with clothing management and requested setup to brush his teeth.   Pt completed oral hygiene with setup and supervision but ignored cues relating to use of emesis basin when spitting.   After oral hygiene pt completed only 5 min of seated soccer ball dribble using corner of room to contain ball.   Pt attempted 1 stand with Max assist but was unable to weight-shift to his LLE to enable kicking with his right LE.   Pt requested termination of session and refused further participation with 11 minutes remaining.   Pt returned to enclosure bed from w/c with overall mod assist x 1.     Therapy Documentation Precautions:  Precautions Precautions: Fall, Other (comment) Precaution Comments:  bone flap removed R side of head in abdomen; hemet for OOB; Pt no longer with NG or PICC; enclosure bed Required Braces or Orthoses: Other Brace/Splint Other Brace/Splint: helmet for OOB Restrictions Weight Bearing Restrictions: No   General: General OT Amount of Missed Time: 11 Minutes   Pain:     See Function Navigator for Current Functional Status.   Therapy/Group: Individual Therapy  Cyntia Staley 02/21/2017, 1:35 PM

## 2017-02-21 NOTE — Progress Notes (Signed)
Patient ID: [name], male   DOB: 03/26/1966, 51 y.o.   MRN: [id number]   02/21/17.  [name] is a 51 y.o. male  who is status post traumatic right ICH/SDH.  He is status post right frontal temporoparietal craniectomy on 01/14/2017.  He is admitted for CIR with functional mobility and cognitive deficits  Past Medical History:  Diagnosis Date  . Diabetes mellitus without complication (HCC)      Subjective: No new complaints. Remains stable  Objective: Vital signs in last 24 hours: Temp:  [98.1 F (36.7 C)-98.9 F (37.2 C)] 98.1 F (36.7 C) (04/22 0459) Pulse Rate:  [88-92] 88 (04/22 0459) Resp:  [18] 18 (04/22 0459) BP: (102-118)/(67-68) 118/68 (04/22 0459) SpO2:  [99 %-100 %] 100 % (04/22 0459) Weight change:  Last BM Date: 02/18/17  Intake/Output from previous day: 04/21 0701 - 04/22 0700 In: 480 [P.O.:480] Out: 1000 [Urine:1000] Last cbgs: CBG (last 3)   Recent Labs  02/20/17 1624 02/20/17 2053 02/21/17 0639  GLUCAP 109* 210* 130*     Physical Exam General: No apparent distress   HEENT: not dry Lungs: Normal effort. Lungs clear to auscultation, no crackles or wheezes. Cardiovascular: Regular rate and rhythm, no edema Abdomen: S/NT/ND; BS(+) Musculoskeletal:  unchanged Neurological: No new neurological deficits Wounds: N/A    Skin: clear  Aging changes Mental state: Alert, Simple one word answers.  Remains in enclosure bed  CBG (last 3)   Recent Labs  02/20/17 1624 02/20/17 2053 02/21/17 0639  GLUCAP 109* 210* 130*   BP Readings from Last 3 Encounters:  02/21/17 118/68  02/01/17 117/70  07/31/16 116/72     Lab Results: BMET    Component Value Date/Time   NA 141 02/15/2017 0544   K 3.9 02/15/2017 0544   CL 105 02/15/2017 0544   CO2 25 02/15/2017 0544   GLUCOSE 123 (H) 02/15/2017 0544   BUN 10 02/15/2017 0544   CREATININE 0.62 02/15/2017 0544   CREATININE 0.71 06/02/2016 1232   CALCIUM 9.0 02/15/2017 0544   GFRNONAA >60 02/15/2017 0544   GFRAA >60 02/15/2017 0544   CBC    Component Value Date/Time   WBC 4.6 02/15/2017 0544   RBC 4.07 (L) 02/15/2017 0544   HGB 11.7 (L) 02/15/2017 0544   HCT 35.0 (L) 02/15/2017 0544   PLT 288 02/15/2017 0544   MCV 86.0 02/15/2017 0544   MCH 28.7 02/15/2017 0544   MCHC 33.4 02/15/2017 0544   RDW 13.8 02/15/2017 0544   LYMPHSABS 0.8 02/04/2017 0428   MONOABS 0.2 02/04/2017 0428   EOSABS 0.1 02/04/2017 0428   BASOSABS 0.0 02/04/2017 0428    Medications: I have reviewed the patient's current medications.  Assessment/Plan:  Cognitive and functional deficits secondary to traumatic ICH/SDH.  Continue CIR.  Renew restraints Seizure prophylaxis.  Continue Keppra Diabetes mellitus type 2.  Stable History of hypertension.  Blood pressure remains well controlled  Length of stay, days: 20  Rogelia Boga , MD 02/21/2017, 9:00 AM

## 2017-02-22 ENCOUNTER — Inpatient Hospital Stay (HOSPITAL_COMMUNITY): Payer: Self-pay

## 2017-02-22 ENCOUNTER — Inpatient Hospital Stay (HOSPITAL_COMMUNITY): Payer: Self-pay | Admitting: Occupational Therapy

## 2017-02-22 ENCOUNTER — Inpatient Hospital Stay (HOSPITAL_COMMUNITY): Payer: Self-pay | Admitting: Speech Pathology

## 2017-02-22 LAB — GLUCOSE, CAPILLARY
GLUCOSE-CAPILLARY: 104 mg/dL — AB (ref 65–99)
GLUCOSE-CAPILLARY: 156 mg/dL — AB (ref 65–99)
Glucose-Capillary: 126 mg/dL — ABNORMAL HIGH (ref 65–99)

## 2017-02-22 MED ORDER — BETHANECHOL CHLORIDE 25 MG PO TABS
25.0000 mg | ORAL_TABLET | Freq: Three times a day (TID) | ORAL | Status: DC
  Administered 2017-02-22 – 2017-03-04 (×28): 25 mg via ORAL
  Filled 2017-02-22 (×29): qty 1

## 2017-02-22 NOTE — Progress Notes (Addendum)
Physical Therapy Note  Patient Details  Name: [name] MRN: [id number] Date of Birth: August 18, 1966 Today's Date: 02/22/2017  1100-1200, 60 min individual tx Pain: none per pt, but he initially refused to wear helmet due to discomfort it causes head.  ABD eventually placed over scar, then helmet donned.  tx focused on transfers, gait training, and neuromuscular re-education via wt bearing through bil LEs .    Pt initially would state that he wanted to walk, and 2 ADs were attempted: grocery cart, EVA walker.  Pt became distracted by use of AD and gait would deteriorate after 5-10 steps.  With +2 gait, pt's arms across helper's shoulders, gait was much more fluid: x 50', x 40' including turn to sit, and x 35' including turn to sit on [place] bed.  Assistance needed for wt shifting to facilitate stepping.  PT educated sister on avoiding excessive explanations/discussion with pt when he initially refuses movement, as this is counterproductive at this point.. Pt left resting in [place] bed with all needs within reach, sister present.  See function navigator for current status.   Romilda Proby 02/22/2017, 7:57 AM

## 2017-02-22 NOTE — Progress Notes (Signed)
[facility] PHYSICAL MEDICINE & REHABILITATION     PROGRESS NOTE    Subjective/Complaints: Slept last night. No problems this morning.   ROS: Limited due cognitive/behavioral      Objective: Vital Signs: Blood pressure 110/66, pulse 80, temperature 98.5 F (36.9 C), temperature source Oral, resp. rate 18, height  (1.575 m), weight 58.2 kg (128 lb 4.9 oz), SpO2 99 %. No results found. No results for input(s): WBC, HGB, HCT, PLT in the last 72 hours. No results for input(s): NA, K, CL, GLUCOSE, BUN, CREATININE, CALCIUM in the last 72 hours.  Invalid input(s): CO CBG (last 3)   Recent Labs  02/21/17 1145 02/21/17 1648 02/22/17 0655  GLUCAP 91 134* 126*    Wt Readings from Last 3 Encounters:  02/19/17 58.2 kg (128 lb 4.9 oz)  02/01/17 65.6 kg (144 lb 9.6 oz)  07/31/16 68 kg (150 lb)    Physical Exam:    Gen: NAD. Well developed.  HENT: Craniotomy site clean and dry with scab anteriorly--stable Cardiovascular:   RRR Respiratory:  CTA B GI: Soft. Bowel sounds are normal. Neurological: He is alert.  Delayed initiation. Left inattention. Doesn't engage as quickly with the left arm/leg Psychiatric:   Flat but cooperative Skin. Warm and dry. Intact.   Assessment/Plan: 1. Cognitive and functional deficits secondary to right traumatic ICH/SDH s/p craniectomy which require 3+ hours per day of interdisciplinary therapy in a comprehensive inpatient rehab setting. Physiatrist is providing close team supervision and 24 hour management of active medical problems listed below. Physiatrist and rehab team continue to assess barriers to discharge/monitor patient progress toward functional and medical goals.  Function:  Bathing Bathing position   Position: Shower  Bathing parts Body parts bathed by patient: Right arm, Left arm, Chest, Abdomen, Front perineal area, Right upper leg, Left upper leg Body parts bathed by helper: Back, Buttocks  Bathing assist Assist Level:  Touching or steadying assistance(Pt > 75%)      Upper Body Dressing/Undressing Upper body dressing   What is the patient wearing?: Pull over shirt/dress     Pull over shirt/dress - Perfomed by patient: Thread/unthread right sleeve, Thread/unthread left sleeve, Put head through opening Pull over shirt/dress - Perfomed by helper: Pull shirt over trunk        Upper body assist Assist Level: Touching or steadying assistance(Pt > 75%)   Set up : To obtain clothing/put away  Lower Body Dressing/Undressing Lower body dressing   What is the patient wearing?: Pants, Non-skid slipper socks     Pants- Performed by patient: Pull pants up/down Pants- Performed by helper: Thread/unthread right pants leg, Thread/unthread left pants leg Non-skid slipper socks- Performed by patient: Don/doff right sock, Don/doff left sock (doffed but not donned) Non-skid slipper socks- Performed by helper: Don/doff right sock, Don/doff left sock               TED Hose - Performed by helper: Don/doff right TED hose, Don/doff left TED hose  Lower body assist Assist for lower body dressing:  (Mod A)      Toileting Toileting Toileting activity did not occur: Safety/medical concerns Toileting steps completed by patient: Performs perineal hygiene Toileting steps completed by helper: Adjust clothing prior to toileting, Adjust clothing after toileting Toileting Assistive Devices: Grab bar or rail  Toileting assist Assist level: Touching or steadying assistance (Pt.75%)   Transfers Chair/bed transfer   Chair/bed transfer method: Stand pivot Chair/bed transfer assist level: 2 helpers Chair/bed transfer assistive device: Bedrails, Armrests Mechanical  lift: Scientist, water quality activity did not occur: Safety/medical concerns   Max distance: 15 Assist level: Moderate assist (Pt 50 - 74%)   Wheelchair Wheelchair activity did not occur: Safety/medical concerns   Max wheelchair distance:  2' Assist Level: Maximal assistance (Pt 25 - 49%)  Cognition Comprehension Comprehension assist level: Understands basic 25 - 49% of the time/ requires cueing 50 - 75% of the time  Expression Expression assist level: Expresses basic 25 - 49% of the time/requires cueing 50 - 75% of the time. Uses single words/gestures.  Social Interaction Social Interaction assist level: Interacts appropriately 25 - 49% of time - Needs frequent redirection.  Problem Solving Problem solving assist level: Solves basic less than 25% of the time - needs direction nearly all the time or does not effectively solve problems and may need a restraint for safety  Memory Memory assist level: Recognizes or recalls less than 25% of the time/requires cueing greater than 75% of the time  Medical Problem List and Plan: 1. 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 DVT Prophylaxis/Anticoagulation: Subcutaneous heparin. Monitor platelet counts and any signs of bleeding. 3. Pain Management: Hycet  4. Mood: Ativan as needed 5. Neuropsych: This patient is notcapable of making decisions on hisown behalf.  - improved patterns overall  -HS seroquel for sleep/agitation  -limiting benzos  -continue ritalin for arousal and initiation-  bid  -enclosure bed for safety still required--renewed  -soft helmet for safety 6. Skin/Wound Care: Routine skin checks  -helmet for cranium protection  -flap site  Intact  -crani site is clean   7. Fluids/Electrolytes/Nutrition:    -intake reasonable 8.Seizure prophylaxis. Keppra 750 mg twice daily 9.Pneumonia. Five-day course of Ancef  completed 01/22/2017 10.Dysphagia. On D1 thins. Taking meds without issues   11.Urinary retention/Escherichia coli UTI.  Completed course of Cipro.    -emptying more often on his own but incontinent  -continue urecholine---increase to    -timed voids 12.Diabetes mellitus with peripheral neuropathy. Hemoglobin A1c 10.4.  Patient on Glucophage 500 mg twice a day and Glucotrol 5 mg daily at home  -reasonable control  -lantus currently at 15u qhs  -no changes today 13.History of hypertension. Patient on lisinopril 2.5 mg daily prior to admission. Resume as needed  Under control 14.Constipation. Laxative assistance   LOS (Days) 21 A FACE TO FACE EVALUATION WAS PERFORMED  [name], MD 02/22/2017 8:43 AM

## 2017-02-22 NOTE — Progress Notes (Signed)
Speech Language Pathology Daily Session Note  Patient Details  Name: [name] MRN: [id number] Date of Birth: [birth year]  Today's Date: 02/22/2017 SLP Individual Time: 0800-0900 SLP Individual Time Calculation (min): 60 min  Short Term Goals: Week 3: SLP Short Term Goal 1 (Week 3): Patient will consume current diet with minimal overt s/s of aspiration with Mod A verbal cues for use of swallowing compensatory strategies.  SLP Short Term Goal 2 (Week 3): Patient will demonstrate sustained attention to a functional task for ~2 minutes with Mod A verbal cues for redirection.  SLP Short Term Goal 3 (Week 3): Patient will orient to place, situation and time with Mod A multimodal cues.  SLP Short Term Goal 4 (Week 3): Patient will verbally respond to questions in 80% of opportunities with Max A multimodal cues.  SLP Short Term Goal 5 (Week 3): Patient will demonstrate basic problem solving for basic tasks with Mod A verbal cues.   Skilled Therapeutic Interventions: Skilled treatment session focused on dysphagia and cognitive goals. Patient consumed breakfast meal of Dys. 1 textures with thin liquids without overt s/s of aspiration with intermittent Min A verbal cues needed for swallow initiation. Patient also consistently used liquid washes prior to swallowing Dys. 1 textures. Recommend patient continue current diet. Patient demonstrated increased verbal expression and social engagement with clinician. However, patient speaking at an extremely fast rate which made it difficult for the interpreter to decode but majority consisted of language of confusion. Patient independently requested to use the bathroom with Max A verbal cues needed for following commands and safety with transfer via the [place] Dec. Patient reported he voided but actually did not appear to. Patient left in enclosure bed with RN present. Continue with current plan of care.   Function:  Eating Eating   Modified Consistency  Diet: Yes Eating Assist Level: Helper checks for pocketed food;Supervision or verbal cues;Set up assist for   Eating Set Up Assist For: Opening containers       Cognition Comprehension Comprehension assist level: Understands basic 50 - 74% of the time/ requires cueing 25 - 49% of the time  Expression   Expression assist level: Expresses basic 50 - 74% of the time/requires cueing 25 - 49% of the time. Needs to repeat parts of sentences.  Social Interaction Social Interaction assist level: Interacts appropriately 25 - 49% of time - Needs frequent redirection.  Problem Solving Problem solving assist level: Solves basic less than 25% of the time - needs direction nearly all the time or does not effectively solve problems and may need a restraint for safety  Memory Memory assist level: Recognizes or recalls less than 25% of the time/requires cueing greater than 75% of the time    Pain No/Denies Pain   Therapy/Group: Individual Therapy  [name] 02/22/2017, 3:08 PM

## 2017-02-22 NOTE — Progress Notes (Signed)
Occupational Therapy Session Note  Patient Details  Name: [name] MRN: [id number] Date of Birth: 1966/09/19  Today's Date: 02/22/2017 OT Individual Time: 1000-1059 and 1345-1430 OT Individual Time Calculation (min): 59 min and 45 min  Skilled Therapeutic Interventions/Progress Updates: Skilled OT session completed with focus on cognitive remediation, functional transfers, and standing balance. Pt was lying in bed without LB garments at time of arrival, had torn off brief. Pt was agreeable to shower. Gripper socks donned while supine with supervision. Pt transitioned to EOB with therapist's hand to guide. Stand pivot<w/c completed with 2 helpers with max verbal/tactile cues for hand placement. Pt with posterior lean, required cues to move his feet. Shower bench transfer completed with Max A and grab bars. He bathed with helmet donned and therapist controlling HH shower hose to keep head dry. Pt able to apply soap to wash cloth when presented with items. Required cues to attend to all body areas, as he washed chest, face, and left upper leg and announced "I'm done." He required cues for thoroughness and assist for lower legs. Sit<stand with Max A for pericare. Afterwards he completed dressing w/c level at sink. Pt able to thread both legs into pants today! Pt still tries to bring deoderant to face, tells interpretor that he needs to put it on his cheek also. Pt becoming agitated prior to lifting pants over hips and pulling off helmet. He reports "screws are sticking into him." Even with wash cloth placed under helmet, pt still refusing to reapply. Pt redirected to dressing task and stood with Max A at sink, 2nd helper assisting with pants. Afterwards he was returned to enclosure bed, provided calming cues to improve affect. He was left in secure bed at time of departure.   PA made aware of pts discomfort with helmet.     2nd Session 1:1 tx (45 min) Pt was received via PT handoff. He was  escorted to dayroom where bowling activity was set up. Tx focus on initiation, attention, and bilateral UE coordination. Pt participated in bowling while seated, able to integrate bilateral UEs in coordination fashion to knock over pins. Able to initiate appropriate action when ball was placed in lap. Attention maintained for 1-2 minutes at a time. Max redirectional cues for pt to keep helmet on and to keep hands out of pants. Pt attempting to stand x2. Afterwards pt was transferred to elevated mat with with 2 helpers, pt leaning posteriorly and resistant to allowing therapist move L UE for proper hand placement. At [facility], worked on trunk control and attention with ball toss activity. Pt only using R UE, laid down x2 and required cues and encouragement to sit back up. He transferred from EOM<w/c<enclosure bed with Max A-2 helpers. He was secured in bed with all needs at time of departure. Sister present.    Therapy Documentation Precautions:  Precautions Precautions: Fall, Other (comment) Precaution Comments: bone flap removed R side of head in abdomen; hemet for OOB; Pt no longer with NG or PICC; enclosure bed Required Braces or Orthoses: Other Brace/Splint Other Brace/Splint: helmet for OOB Restrictions Weight Bearing Restrictions: No   Pain: No c/o pain during tx    ADL:      See Function Navigator for Current Functional Status.   Therapy/Group: Individual Therapy  Raychelle Hudman A Xiomar Crompton 02/22/2017, 12:42 PM

## 2017-02-22 NOTE — Progress Notes (Signed)
Physical Therapy Session Note  Patient Details  Name: [name] MRN: [id number] Date of Birth: 02/01/1966  Today's Date: 02/22/2017 PT Individual Time: 1300-1345 PT Individual Time Calculation (min): 45 min   Short Term Goals: Week 3:  PT Short Term Goal 1 (Week 3): Pt will stand with max assist +1.  PT Short Term Goal 2 (Week 3): Pt will intiate all functional mobility tasks 50% of the time. PT Short Term Goal 3 (Week 3): Pt will ambulate 10 ft with max assist +1.  Skilled Therapeutic Interventions/Progress Updates:    Session focused on functional transfers and mobility while addressing cognitive remediation including basic transfers, simulated car transfer, gait, and w/c propulsion. Focused on attention, sequencing, initiation, motor planning, coordination, and awareness during mobility. Pt requires mod to max assist with +2 assist for transfers and gait (one person on each side) with cues and facilitation for weightshifting and initiating stepping sequence. Pt with increased difficulty with motor planning to get out of car despite multiple attempts at cues, demonstration, and hand over hand assist. Pt able to gait up/down ramp with +2 assist and intermittent use of railings for support. Attempted w/c mobility with any combination of UE/LE use - pt able to use BUE for about 5' with max cues but then returned to only RUE - even when pt going in circles, made no attempt to adjust. Handoff to OT in hallway. Pt able to recall that we worked on car transfer and walking but also stated we "cleaned'. Medical interpreter present as well as sister observing session.   Therapy Documentation Precautions:  Precautions Precautions: Fall, Other (comment) Precaution Comments: bone flap removed R side of head in abdomen; hemet for OOB; Pt no longer with NG or PICC; enclosure bed Required Braces or Orthoses: Other Brace/Splint Other Brace/Splint: helmet for OOB Restrictions Weight Bearing  Restrictions: No  Pain:  No c/o pain.   See Function Navigator for Current Functional Status.   Therapy/Group: Individual Therapy  Karolee Stamps Darrol Poke, PT, DPT  02/22/2017, 2:47 PM

## 2017-02-23 ENCOUNTER — Inpatient Hospital Stay (HOSPITAL_COMMUNITY): Payer: Self-pay | Admitting: Occupational Therapy

## 2017-02-23 ENCOUNTER — Inpatient Hospital Stay (HOSPITAL_COMMUNITY): Payer: Self-pay | Admitting: Speech Pathology

## 2017-02-23 ENCOUNTER — Inpatient Hospital Stay (HOSPITAL_COMMUNITY): Payer: Self-pay | Admitting: Physical Therapy

## 2017-02-23 DIAGNOSIS — R339 Retention of urine, unspecified: Secondary | ICD-10-CM

## 2017-02-23 LAB — GLUCOSE, CAPILLARY
GLUCOSE-CAPILLARY: 117 mg/dL — AB (ref 65–99)
Glucose-Capillary: 146 mg/dL — ABNORMAL HIGH (ref 65–99)
Glucose-Capillary: 122 mg/dL — ABNORMAL HIGH (ref 65–99)
Glucose-Capillary: 134 mg/dL — ABNORMAL HIGH (ref 65–99)

## 2017-02-23 MED ORDER — CEPHALEXIN 250 MG PO CAPS
500.0000 mg | ORAL_CAPSULE | Freq: Three times a day (TID) | ORAL | Status: DC
  Administered 2017-02-23 – 2017-02-26 (×10): 500 mg via ORAL
  Filled 2017-02-23 (×11): qty 2

## 2017-02-23 NOTE — Progress Notes (Signed)
Patient noted with yellow colored drainage on pillow case. RN assessed right scalp incision and scabbed areas and noted yellow colored pus drainage from anterior and posterior areas of scalp.MD notified and assessed area. Orders received. Painted incision with betadine. Continue with plan of care . Cleotilde Neer

## 2017-02-23 NOTE — Progress Notes (Signed)
Physical Therapy Session Note  Patient Details  Name: [name] MRN: [id number] Date of Birth: July 05, 1966  Today's Date: 02/23/2017 PT Individual Time: 7829-5621 and 3086-5784 PT Individual Time Calculation (min): 42 min and 38 min  Short Term Goals: Week 3:  PT Short Term Goal 1 (Week 3): Pt will stand with max assist +1.  PT Short Term Goal 2 (Week 3): Pt will intiate all functional mobility tasks 50% of the time. PT Short Term Goal 3 (Week 3): Pt will ambulate 10 ft with max assist +1.  Skilled Therapeutic Interventions/Progress Updates:  Treatment 1: Pt received in w/c at nurses station with professional interpreter present. Pt without behaviors or c/o pain. Session focused on BLE NMR, gait, and cognitive remediation (initiation, attention, orientation). Pt ambulated 35 ft + 30 ft with rail in hallway & max assist; pt requires cuing to advance BLE as he will attempt to lean further down rail without moving feet. Attempted to have pt ambulate without UE support but pt continues to reach for objects to hold to (rail, door). Pt demonstrates inconsistent step length & width BLE, as well as impaired coordination. Pt requires significantly extra time and max encouragement to initiate tasks, and pt is easily distracted in hallway. Transitioned to cognitive tasks in day room; pt engaged in peg board task but unable to reproduce most simple pattern nor follow commands for selecting a certain number of colored pegs to place on board despite max cuing & education. Pt able to correctly write his first name, birthday, and copy shapes on white board but pt consistently states the current year is "1960" or "1966". During tasks, L inattention was addressed; pt continues to not use LUE. Pt then repeatedly stating "Let's be done with this. I'm ready to go." Pt returned to room via w/c total assist and assisted back to bed. Pt required total assist for doffing tennis shoes 2/2 poor initiation. At end of  session pt left in enclosure bed with all needs within reach. Throughout session pt required max education and encouragement to continue to wear helmet as pt repeatedly stated "it's injuring me".  Treatment 2: Pt received on toilet with assistance from RN & rehab tech. Pt without void or BM on toilet but completed peri hygiene with cuing and extra time for initiation; therapist assisted to ensure cleanliness. Once standing pt required cuing to assist with pulling pants over hips then +2 assist to pivot to w/c. Gave pt choice of 2 options, ride bike or walk, & pt selected to walk. Transported pt to rail in hallway outside of gym. Pt easily distracted and required max cuing to initiate sit>stand. Pt able to transfer sit>stand with max assist but unable to maintain position. Pt did not engage in ambulation despite extra time and cuing. Transitioned to day room and cognitive task of sorting red vs black coins which pt was able to do correctly with moderate cuing for attention to task. Pt then unable to correctly place coins on checkerboard despite error correction & max cuing. Encouraged pt to place all coins back in box using LUE but pt unwilling and kept LUE in a fist throughout session, despite ability to extend fingers. Pt becoming restless in w/c, removing shoes & attempting to walk without assistance. Pt beginning to report he is ready to go. Pt returned to room & assisted back to bed. Pt missed 7 minutes of skilled PT treatment 2/2 fatigue & restlessness. At end of session pt left in enclosure bed with  all needs within reach.   Therapy Documentation Precautions:  Precautions Precautions: Fall, Other (comment) Precaution Comments: bone flap removed R side of head in abdomen; hemet for OOB; Pt no longer with NG or PICC; enclosure bed Required Braces or Orthoses: Other Brace/Splint Other Brace/Splint: helmet for OOB Restrictions Weight Bearing Restrictions: No   General: PT Amount of Missed Time  (min): 18 Minutes + 7 minutes PT Missed Treatment Reason: Patient fatigue & restlessness   See Function Navigator for Current Functional Status.   Therapy/Group: Individual Therapy  Sandi Mariscal 02/23/2017, 4:16 PM

## 2017-02-23 NOTE — Progress Notes (Signed)
Nutrition Follow-up  DOCUMENTATION CODES:   Not applicable  INTERVENTION:  Continue Glucerna Shake po TID, each supplement provides 220 kcal and 10 grams of protein.  Encourage adequate PO intake.  NUTRITION DIAGNOSIS:   Increased nutrient needs related to acute illness as evidenced by estimated needs; ongoing  GOAL:   Patient will meet greater than or equal to 90% of their needs; met  MONITOR:   PO intake, Supplement acceptance, Labs, Weight trends, Skin, I & O's, Diet advancement  REASON FOR ASSESSMENT:   Consult Enteral/tube feeding initiation and management  ASSESSMENT:   [name] with history of hypertension, diabetes mellitus. Presented 01/10/2017 after being found down from a supposed fall. CT of the head and imaging revealed intracranial hemorrhage with a 4.5 cm right frontal intraparenchymal hemorrhage, 6 mm right subdural hematoma and other scattered areas of intraparenchymal and subarachnoid hemorrhage with right to left midline shift. Nondisplaced vertical fracture through the left temporal bone.  Underwent right frontal temporal parietal craniectomy for hematoma evacuation 01/14/2017 per Dr. Cyndy Freeze with placement of bone flap in the abdomen.  Nasogastric tube in place for nutritional support patient remains NPO  Pt continues on a dysphagia 1 diet with thin liquids. Meal completion has been 75-100%. Pt currently has Glucerna Shake ordered and has been consuming them. Intake has been adequate. RD to continue with current orders to aid increased nutrition and protein needs. Labs and medications reviewed.   Diet Order:  DIET - DYS 1 Room service appropriate? Yes; Fluid consistency: Thin  Skin:  Wound (see comment) (Incisin on head R)  Last BM:  4/24  Height:   Ht Readings from Last 1 Encounters:  02/01/17 '5\' 2"'$  (1.575 m)    Weight:   Wt Readings from Last 1 Encounters:  02/19/17 128 lb 4.9 oz (58.2 kg)    Ideal  Body Weight:  53.6 kg  BMI:  Body mass index is 23.47 kg/m.  Estimated Nutritional Needs:   Kcal:  1800-2000  Protein:  100-115 grams  Fluid:  >/= 1.8 L/day  EDUCATION NEEDS:   No education needs identified at this time  Corrin Parker, MS, RD, LDN Pager # [phone] After hours/ weekend pager # [phone]

## 2017-02-23 NOTE — Progress Notes (Signed)
Physical Therapy Weekly Progress Note  Patient Details  Name: [name] MRN: [id number] Date of Birth: 10/31/1966  Beginning of progress report period: February 16, 2017 End of progress report period: February 23, 2017  Today's Date: 02/23/2017  Patient has met 2 of 3 short term goals.  Pt is making slow progress towards LTG's as pt continues to be limited by physical and cognitive impairments. Pt continues to demonstrate poor initiation of tasks and impaired attention. Pt has initiated ambulation but requires +2 to max assist +1 2/2 impaired coordination and strength. Pt has potential to continue to make physical gains and would benefit from continued skilled PT treatment to focus on cognitive remediation and functional mobility.  Patient continues to demonstrate the following deficits muscle weakness, decreased cardiorespiratoy endurance, impaired timing and sequencing and decreased coordination, decreased visual perceptual skills, decreased attention to left, decreased initiation, decreased attention, decreased awareness, decreased problem solving, decreased safety awareness, decreased memory and delayed processing, and decreased sitting balance, decreased standing balance, decreased postural control and decreased balance strategies and therefore will continue to benefit from skilled PT intervention to increase functional independence with mobility.  Patient's long term ambulation goals have been downgraded to Mod assist 2/2 pt's slow progress.  Continue plan of care.  PT Short Term Goals Week 3:  PT Short Term Goal 1 (Week 3): Pt will stand with max assist +1.  PT Short Term Goal 1 - Progress (Week 3): Met PT Short Term Goal 2 (Week 3): Pt will intiate all functional mobility tasks 50% of the time. PT Short Term Goal 2 - Progress (Week 3): Not met PT Short Term Goal 3 (Week 3): Pt will ambulate 10 ft with max assist +1. PT Short Term Goal 3 - Progress (Week 3): Met Week 4:  PT Short  Term Goal 1 (Week 4): Pt will consistently ambulate 30 ft with mod assist +1. PT Short Term Goal 2 (Week 4): Pt will initiate functional mobilty tasks with moderate cuing. PT Short Term Goal 3 (Week 4): Pt will negotiate 4 steps with max assist.   Therapy Documentation Precautions:  Precautions Precautions: Fall, Other (comment) Precaution Comments: bone flap removed R side of head in abdomen; hemet for OOB; Pt no longer with NG or PICC; enclosure bed Required Braces or Orthoses: Other Brace/Splint Other Brace/Splint: helmet for OOB Restrictions Weight Bearing Restrictions: No   See Function Navigator for Current Functional Status.  Therapy/Group: Individual Therapy  [name] 02/23/2017, 3:06 PM

## 2017-02-23 NOTE — Progress Notes (Signed)
Occupational Therapy Session Note  Patient Details  Name: [name] MRN: [id number] Date of Birth: [birth year]  Today's Date: 02/23/2017 OT Individual Time: 1000-1100 OT Individual Time Calculation (min): 60 min    Short Term Goals: Week 2:  OT Short Term Goal 1 (Week 2): Pt will perform toilet transfer with max A of 1 in order to decrease level of assistance needed. OT Short Term Goal 1 - Progress (Week 2): Progressing toward goal OT Short Term Goal 2 (Week 2): Pt will maintain sitting balance with mod A or less for 3 minutes during functional task. OT Short Term Goal 2 - Progress (Week 2): Met OT Short Term Goal 3 (Week 2): Pt will sustain attention during bathing and dressing for 2 minutes with mod multimodal cues. OT Short Term Goal 3 - Progress (Week 2): Met Week 3:  OT Short Term Goal 1 (Week 3): Pt will perform toileting with max A in order to decrease assistance with functional task.  OT Short Term Goal 2 (Week 3): Pt will utilize L UE in functional tasks with mod multimodal cues. OT Short Term Goal 3 (Week 3): Pt will initiate sit <>stand for LB clothing management with mod A.   Skilled Therapeutic Interventions/Progress Updates:  1:1 Pt in bed when arrived. Focus on transfer training and self care retraining at shower level. Pt required more than reasonable amt of time and max multimodal cues to come to EOB and transfers (with min A to w/c). Again with extra time and use of the grab bar pt transfer to toilet with mod A and able to pull down pants with mod A for standing balance. Ambulated short distance into the shower with grab bars with mod A. Bathed with setup  With min to mod A after setup.  Pt transferred with min A into the w/c (with max A cues) from the shower. Pt able to pick out clothing and dress with setup and extra time with min to mod A for balance and organization of clothing. Pt ambulated from dresser to sink with mod to max A and stood to brush his teeth  with setup, Pt then ambulated to RN station with max A with intermittent use of rail in hallway. Pt not oriented to place, time or situation. Mod A transfer in and out of regular (high bed ~30inches) in ADL apartment.   Left at Rn station in prep for next therapy session.      1:1 2nd session 13:00-13:45 (23mn) Focus on following one step directions, transitional movements, hip and trunk postural control in tall kneeling, selective attention with mod A. Pt transferred from w/c to mat with min A and then max A down to the floor. Transitioned into tall kneeling with mod -max A to assist with motor planning. Pt engaged in playing Spot in in tall kneeling with mod to max A to attend to task in minimal distracting environment. Pt with required max A to find a matching object in a small field. Transitioned back into sitting on the mat with max A with more than reasonable amt of time and and into w/c from mat with mod A,    Therapy Documentation Precautions:  Precautions Precautions: Fall, Other (comment) Precaution Comments: bone flap removed R side of head in abdomen; hemet for OOB; Pt no longer with NG or PICC; enclosure bed Required Braces or Orthoses: Other Brace/Splint Other Brace/Splint: helmet for OOB Restrictions Weight Bearing Restrictions: No  Pain:  No pain in first session 2nd  session : Pain Assessment Faces Pain Scale: Hurts little more Pain Type: Acute pain Pain Location: Head Pain Descriptors / Indicators: Discomfort;Grimacing;Headache Pain Intervention(s): Medication (See eMAR)  See Function Navigator for Current Functional Status.   Therapy/Group: Individual Therapy  [name] [facility] 02/23/2017, 2:27 PM

## 2017-02-23 NOTE — Patient Care Conference (Signed)
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 Essential hypertension   . Agitated   . Lethargy   . Benign essential HTN   . Poorly controlled type 2 diabetes mellitus with peripheral neuropathy (HCC)   . 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 Aphasia 02/01/2017  . Dysphagia, oropharyngeal 02/01/2017  . Intracerebral hemorrhage (HCC) 01/14/2017  . Fall 01/10/2017  . Subdural hemorrhage (HCC) 01/10/2017  . Diabetes type 2, uncontrolled (HCC) 06/13/2015    Expected Discharge Date: Expected Discharge Date: 03/12/17  Team Members Present: Physician leading conference: Dr. Faith Rogue Social Worker Present: Amada Jupiter, LCSW Nurse Present: Carmie End, RN PT Present: Karolee Stamps, PT;Victoria Hyacinth Meeker, PT OT Present: Roney Mans, OT SLP Present: Feliberto Gottron, SLP PPS Coordinator present : Tora Duck, RN, CRRN     Current Status/Progress Goal Weekly Team Focus  Medical   improving attention/awareness. still requires enclosure bed for safety, retaining urine  ipmrove safety awareness  bladder mgt, safety, pain, nutrition   Bowel/Bladder   Incontinent of bowel/bladder; I&O cath q 8; LBM 4/22  continent of bowe/bladder with mod I  Timed toileting q 2 and as needed   Swallow/Nutrition/ Hydration   Dys. 1  textures with thin liquids, Min A  Min A with least restrictive diet  use of swallowing strategies, possible trials of upgraded textures    ADL's   UB self care supervision-Min A, LB self care Mod A, Max A sit<stand, Mod-Max A functional transfers  Min-Mod A  ADL retraining, cognitive remediation, dynamic balance, family education, NMR   Mobility   poor initiation & awareness, +1-2 for ambulation, impaired coordination  min assist overall  cognitive remediation, L NMR, gait training, endurance/strength training, transfers   Communication   Mod A, interpreter haveing difficulty translating at time due to rate of speech  Min A  ability to follow commands and express basic wants/needs    Safety/Cognition/ Behavioral Observations  Max-Total A  Min A  orientation, initiation, attention   Pain   no complain of pain  <2  assess and treat pain q shift and as needed   Skin   s/p crani wound- head healing; pt. scratched site at times  skin to be free of breakdown/infection with min assist  assess and adress any skin issues q shift and as needed    Rehab Goals Patient on target to meet rehab goals: Yes *See Care Plan and progress notes for long and short-term goals.  Barriers to Discharge: see prior, urine retention    Possible Resolutions to Barriers:  see prior, i/o caths, rx ?UTI    Discharge Planning/Teaching Needs:  Plan for pt to d/c home with sister and her family.  Sister to be primary caregiver and  aware need for 24/7 support.  Teaching to be ongoing.   Team Discussion:  Slow and steady progress;  restless but not agitated;  Still with poor orientation but better initiation.  Still needing caths but at times can empty and is continent - MD to recheck UCX.  Ambulating now with +1 and rail/  Apraxic left hand and with feeding.  Still on D1, thin.  Significant confusion which is becoming more evident with increased verbalizations.  Continues to touch and pick at head.  Revisions to  Treatment Plan:  None   Continued Need for Acute Rehabilitation Level of Care: The patient requires daily medical management by a physician with specialized training in physical medicine and rehabilitation for the following conditions: Daily direction of a multidisciplinary physical rehabilitation program to ensure safe treatment while eliciting the highest outcome that is of practical value to the patient.: Yes Daily medical management of patient stability for increased activity during participation in an intensive rehabilitation regime.: Yes Daily analysis of laboratory values and/or radiology reports with any subsequent need for medication adjustment of medical intervention for : Neurological problems;Mood/behavior problems;Urological problems  [name] 02/23/2017, 3:53 PM

## 2017-02-23 NOTE — Progress Notes (Signed)
Social Work  Patient ID: [name], male   DOB: 1966/04/27, 51 y.o.   MRN: [id number]   Have reviewed team conference with pt's sister who is aware and agreeable with change in d/c date to 5/11 as he continues to make good progress and team feels this would be beneficial to reaching goals.  She remains very concerned about his care but hopeful he will reach a level she can manage.  Not yet ready for neuropsych.    Korrina Zern, LCSW

## 2017-02-23 NOTE — Plan of Care (Signed)
Problem: RH Balance Goal: LTG Patient will maintain dynamic standing balance (PT) LTG:  Patient will maintain dynamic standing balance with assistance during mobility activities (PT)  Downgrade 2/2 slow progress  Problem: RH Ambulation Goal: LTG Patient will ambulate in controlled environment (PT) LTG: Patient will ambulate in a controlled environment, # of feet with assistance (PT).  50 ft; downgrade 2/2 slow progress Goal: LTG Patient will ambulate in home environment (PT) LTG: Patient will ambulate in home environment, # of feet with assistance (PT).  25 ft; downgrade 2/2 slow progress  Problem: RH Wheelchair Mobility Goal: LTG Patient will propel w/c in controlled environment (PT) LTG: Patient will propel wheelchair in controlled environment, # of feet with assist (PT)  25 ft; downgrade 2/2 slow progress  Problem: RH Awareness Goal: LTG: Patient will demonstrate intellectual/emergent (PT) LTG: Patient will demonstrate intellectual/emergent/anticipatory awareness with assist during a mobility activity  (PT)  Downgrade 2/2 slow progress

## 2017-02-23 NOTE — Progress Notes (Addendum)
Speech Language Pathology Weekly Progress and Session Note  Patient Details  Name: [name] MRN: [id number] Date of Birth: 1966-06-21  Beginning of progress report period: February 16, 2017 End of progress report period: February 23, 2017  Today's Date: 02/23/2017 SLP Individual Time: 0800-0900 SLP Individual Time Calculation (min): 60 min  Short Term Goals: Week 3: SLP Short Term Goal 1 (Week 3): Patient will consume current diet with minimal overt s/s of aspiration with Mod A verbal cues for use of swallowing compensatory strategies.  SLP Short Term Goal 1 - Progress (Week 3): Met SLP Short Term Goal 2 (Week 3): Patient will demonstrate sustained attention to a functional task for ~2 minutes with Mod A verbal cues for redirection.  SLP Short Term Goal 2 - Progress (Week 3): Met SLP Short Term Goal 3 (Week 3): Patient will orient to place, situation and time with Mod A multimodal cues.  SLP Short Term Goal 3 - Progress (Week 3): Not met SLP Short Term Goal 4 (Week 3): Patient will verbally respond to questions in 80% of opportunities with Max A multimodal cues.  SLP Short Term Goal 4 - Progress (Week 3): Met SLP Short Term Goal 5 (Week 3): Patient will demonstrate basic problem solving for basic tasks with Mod A verbal cues.  SLP Short Term Goal 5 - Progress (Week 3): Not met    New Short Term Goals: Week 4: SLP Short Term Goal 1 (Week 4): Patient will consume current diet with minimal overt s/s of aspiration with Min A verbal cues for use of swallowing compensatory strategies.  SLP Short Term Goal 2 (Week 4): Patient will demonstrate sustained attention to a functional task for ~2 minutes with Min A verbal cues for redirection.  SLP Short Term Goal 3 (Week 4): Patient will orient to place, situation and time with Max A multimodal cues.  SLP Short Term Goal 4 (Week 4): Patient will demonstrate basic problem solving for basic tasks with Max A verbal cues.  SLP Short Term Goal 5  (Week 4): Patient will demonstrate efficient mastication with mild oral residue and minimal overt s/s of aspiration with Mod A verbal cues over 2 sessions prior to upgrade.   Weekly Progress Updates: Patient has made small and inconsistent gains and has met 3 of 5 STG's this reporting period. Currently, patient demonstrates behaviors consistent with a Rancho Level V and requires overall Max-Total A to complete functional and familiar tasks safely as well as orientation. However, patient demonstrates increased verbal expression and social engagement but also demonstrates increased language of confusion and confabulation. Patient continues to consume Dys. 1 textures with thin liquids with minimal overt s/s of aspiration requires Min A verbal cues for use of swallowing compensatory strategies. Patient and family education is ongoing. Patient would benefit from continued skilled SLP intervention to maximize his cognitive and swallowing function and overall functional independence prior to discharge.      Intensity: Minumum of 1-2 x/day, 30 to 90 minutes Frequency: 3 to 5 out of 7 days Duration/Length of Stay: 03/12/17 Treatment/Interventions: English as a second language teacher;Dysphagia/aspiration precaution training;Internal/external aids;Environmental controls;Therapeutic Activities;Patient/family education;Functional tasks;Cognitive remediation/compensation   Daily Session  Skilled Therapeutic Interventions: Skilled treatment session focused on dysphagia and cognitive goals. Patient consumed breakfast meal of Dys. 1 textures with thin liquids without overt s/s of aspiration with intermittent Min A verbal cues needed for swallow initiation. Patient also consistently used liquid washes prior to swallowing Dys. 1 textures. Recommend patient continue current diet. Patient continues to demonstrate  increased verbal expression and social engagement with clinician but continues to demonstrate language of confusion and total A for  orientation. Patient independently requested to use the bathroom with Max A verbal cues needed for following commands and safety with transfer via the Clarise Cruz. Patient able to successfully void. Patient left in enclosure bed with RN present. Continue with current plan of care.      Function:   Eating Eating   Modified Consistency Diet: Yes Eating Assist Level: Set up assist for;Supervision or verbal cues;Helper checks for pocketed food   Eating Set Up Assist For: Opening containers       Cognition Comprehension Comprehension assist level: Understands basic 25 - 49% of the time/ requires cueing 50 - 75% of the time  Expression   Expression assist level: Expresses basic 50 - 74% of the time/requires cueing 25 - 49% of the time. Needs to repeat parts of sentences.  Social Interaction Social Interaction assist level: Interacts appropriately 25 - 49% of time - Needs frequent redirection.  Problem Solving Problem solving assist level: Solves basic less than 25% of the time - needs direction nearly all the time or does not effectively solve problems and may need a restraint for safety  Memory Memory assist level: Recognizes or recalls less than 25% of the time/requires cueing greater than 75% of the time   Pain No/Denies Pain   Therapy/Group: Individual Therapy  Kristol Almanzar 02/23/2017, 3:16 PM

## 2017-02-23 NOTE — Progress Notes (Signed)
[facility] & RE[facility]ILIT[facility]     PROGRESS NOTE    Subjective/Complaints: Up with therapy. No new issues overnight.    ROS: Limited due cognitive/behavioral    Objective: Vital Signs: Blood pressure (!) 98/55, pulse 84, temperature 97.3 F (36.3 C), temperature source Oral, resp. rate 20, height  (1.575 m), weight 58.2 kg (128 lb 4.9 oz), SpO2 99 %. No results found. No results for input(s): WBC, HGB, HCT, PLT in the last 72 hours. No results for input(s): NA, K, CL, GLUCOSE, BUN, CREATININE, CALCIUM in the last 72 hours.  Invalid input(s): CO CBG (last 3)   Recent Labs  02/22/17 1206 02/22/17 1617 02/23/17 0643  GLUCAP 104* 156* 146*    Wt Readings from Last 3 Encounters:  02/19/17 58.2 kg (128 lb 4.9 oz)  02/01/17 65.6 kg (144 lb 9.6 oz)  07/31/16 68 kg (150 lb)    Physical Exam:    Gen: NAD. Well developed.  HENT: Craniotomy site clean and dry with scab anteriorly--stable Cardiovascular:   RRR Respiratory:  CTA B GI: Soft. Bowel sounds are normal. Neurological: He is alert.  Delayed initiation. Left inattention. Doesn't engage as quickly with the left arm/leg Psychiatric:   Flat but cooperative Skin. Warm and dry. Intact.   Assessment/Plan: 1. Cognitive and functional deficits secondary to right traumatic ICH/SDH s/p craniectomy which require 3+ hours per day of interdisciplinary therapy in a comprehensive inpatient rehab setting. Physiatrist is providing close team supervision and 24 hour management of active medical problems listed below. Physiatrist and rehab team continue to assess barriers to discharge/monitor patient progress toward functional and medical goals.  Function:  Bathing Bathing position   Position: Shower  Bathing parts Body parts bathed by patient: Right arm, Left arm, Chest, Abdomen, Front perineal area, Right upper leg, Left upper leg, Buttocks Body parts bathed by helper: Right lower leg, Left lower leg, Back,  Buttocks  Bathing assist Assist Level: Touching or steadying assistance(Pt > 75%)      Upper Body Dressing/Undressing Upper body dressing   What is the patient wearing?: Pull over shirt/dress     Pull over shirt/dress - Perfomed by patient: Thread/unthread right sleeve, Thread/unthread left sleeve, Put head through opening Pull over shirt/dress - Perfomed by helper: Pull shirt over trunk        Upper body assist Assist Level: Supervision or verbal cues   Set up : To obtain clothing/put away  Lower Body Dressing/Undressing Lower body dressing   What is the patient wearing?: Pants, Non-skid slipper socks     Pants- Performed by patient: Thread/unthread right pants leg, Thread/unthread left pants leg Pants- Performed by helper: Pull pants up/down Non-skid slipper socks- Performed by patient: Don/doff right sock, Don/doff left sock Non-skid slipper socks- Performed by helper: Don/doff right sock, Don/doff left sock               TED Hose - Performed by helper: Don/doff right TED hose, Don/doff left TED hose  Lower body assist Assist for lower body dressing: 2 Helpers (1 helper sit<stand, 1 helper lifting pants over hips)      Toileting Toileting Toileting activity did not occur: Safety/medical concerns Toileting steps completed by patient: Performs perineal hygiene Toileting steps completed by helper: Adjust clothing prior to toileting, Performs perineal hygiene, Adjust clothing after toileting Toileting Assistive Devices: Grab bar or rail  Toileting assist Assist level: Two helpers   Transfers Chair/bed transfer   Chair/bed transfer method: Ambulatory Chair/bed transfer assist level: 2  helpers Chair/bed transfer assistive device: Consulting civil engineer lift: Scientist, water quality activity did not occur: Safety/medical concerns   Max distance: 50 Assist level: 2 helpers   Education administrator activity did not occur: Safety/medical concerns   Max  wheelchair distance: 15' Assist Level: Maximal assistance (Pt 25 - 49%)  Cognition Comprehension Comprehension assist level: Understands basic 25 - 49% of the time/ requires cueing 50 - 75% of the time  Expression Expression assist level: Expresses basic 50 - 74% of the time/requires cueing 25 - 49% of the time. Needs to repeat parts of sentences.  Social Interaction Social Interaction assist level: Interacts appropriately 25 - 49% of time - Needs frequent redirection.  Problem Solving Problem solving assist level: Solves basic less than 25% of the time - needs direction nearly all the time or does not effectively solve problems and may need a restraint for safety  Memory Memory assist level: Recognizes or recalls less than 25% of the time/requires cueing greater than 75% of the time  Medical Problem List and Plan: 1. 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 DVT Prophylaxis/Anticoagulation: Subcutaneous heparin. Monitor platelet counts and any signs of bleeding. 3. Pain Management: Hycet  4. Mood: Ativan as needed 5. Neuropsych: This patient is notcapable of making decisions on hisown behalf.  -HS seroquel for sleep/agitation  -limiting benzos  -continue ritalin for arousal and initiation-  bid  -enclosure bed for safety still required--renewed  -soft helmet for safety 6. Skin/Wound Care: Routine skin checks  -helmet for cranium protection  -flap site  Intact  -crani site is clean   7. Fluids/Electrolytes/Nutrition:    -intake reasonable 8.Seizure prophylaxis. Keppra 750 mg twice daily 9.Pneumonia. Five-day course of Ancef  completed 01/22/2017 10.Dysphagia. On D1 thins. Taking meds without issues   11.Urinary retention/Escherichia coli UTI.  Completed course of Cipro.   -still  req I/O caths.   -urine with odor again---re-send for culture  -continue urecholine---increased to    -timed voids 12.Diabetes mellitus with peripheral neuropathy. Hemoglobin A1c 10.4.  Patient on Glucophage 500 mg twice a day and Glucotrol 5 mg daily at home  -reasonable control at present  -lantus currently at 15u qhs  -no changes today 13.History of hypertension. Patient on lisinopril 2.5 mg daily prior to admission. Resume as needed  Under control 14.Constipation. Laxative assistance   LOS (Days) 22 A FACE TO FACE EVALUATION WAS PERFORMED  [name], MD 02/23/2017 8:49 AM

## 2017-02-24 ENCOUNTER — Inpatient Hospital Stay (HOSPITAL_COMMUNITY): Payer: Self-pay | Admitting: Speech Pathology

## 2017-02-24 ENCOUNTER — Inpatient Hospital Stay (HOSPITAL_COMMUNITY): Payer: Self-pay | Admitting: Occupational Therapy

## 2017-02-24 ENCOUNTER — Inpatient Hospital Stay (HOSPITAL_COMMUNITY): Payer: Self-pay | Admitting: Physical Therapy

## 2017-02-24 DIAGNOSIS — T814XXA Infection following a procedure, initial encounter: Secondary | ICD-10-CM

## 2017-02-24 LAB — GLUCOSE, CAPILLARY
GLUCOSE-CAPILLARY: 137 mg/dL — AB (ref 65–99)
GLUCOSE-CAPILLARY: 137 mg/dL — AB (ref 65–99)
GLUCOSE-CAPILLARY: 135 mg/dL — AB (ref 65–99)
Glucose-Capillary: 146 mg/dL — ABNORMAL HIGH (ref 65–99)

## 2017-02-24 NOTE — Progress Notes (Signed)
Speech Language Pathology Daily Session Note  Patient Details  Name: [name] MRN: [id number] Date of Birth: 06/05/1966  Today's Date: 02/24/2017 SLP Individual Time: 0800-0900 SLP Individual Time Calculation (min): 60 min  Short Term Goals: Week 4: SLP Short Term Goal 1 (Week 4): Patient will consume current diet with minimal overt s/s of aspiration with Min A verbal cues for use of swallowing compensatory strategies.  SLP Short Term Goal 2 (Week 4): Patient will demonstrate sustained attention to a functional task for ~2 minutes with Min A verbal cues for redirection.  SLP Short Term Goal 3 (Week 4): Patient will orient to place, situation and time with Max A multimodal cues.  SLP Short Term Goal 4 (Week 4): Patient will demonstrate basic problem solving for basic tasks with Max A verbal cues.  SLP Short Term Goal 5 (Week 4): Patient will demonstrate efficient mastication with mild oral residue and minimal overt s/s of aspiration with Mod A verbal cues over 2 sessions prior to upgrade.   Skilled Therapeutic Interventions: Skilled treatment session focused on cognitive and dysphagia goals. Upon arrival, patient was supine in enclosure bed and required extra time and Min A verbal cues to sit EOB and transfer to the wheelchair. Patient also required Max A verbal cues for problem solving and initiation with sitting on commode and was unable to void. Patient required total A for orientation to time, place and situation with continued confabulation and perseveration on getting back into bed. Patient also performed self-care tasks at the sink with Mod-Max A verbal cues for problem solving, especially in regards to use of BUE with grooming tools. Patient consumed breakfast meal without overt s/s of aspiration but continues to require Mod-Max A verbal cues for use of small bites/sips and swallow initiation.  Patient transferred back to enclosure bed at end of session with call bell within  reach. Continue with current plan of care.      Function:  Eating Eating   Modified Consistency Diet: Yes Eating Assist Level: Set up assist for;Supervision or verbal cues;Helper checks for pocketed food   Eating Set Up Assist For: Opening containers       Cognition Comprehension Comprehension assist level: Understands basic 25 - 49% of the time/ requires cueing 50 - 75% of the time  Expression   Expression assist level: Expresses basic 25 - 49% of the time/requires cueing 50 - 75% of the time. Uses single words/gestures.  Social Interaction Social Interaction assist level: Interacts appropriately 25 - 49% of time - Needs frequent redirection.  Problem Solving Problem solving assist level: Solves basic less than 25% of the time - needs direction nearly all the time or does not effectively solve problems and may need a restraint for safety  Memory Memory assist level: Recognizes or recalls less than 25% of the time/requires cueing greater than 75% of the time    Pain No/Denies Pain   Therapy/Group: Individual Therapy  Janalyn Higby 02/24/2017, 3:58 PM

## 2017-02-24 NOTE — Progress Notes (Signed)
[facility] MEDICINE & REHABILITATION     PROGRESS NOTE    Subjective/Complaints: No new problems overnight. Pt still retaining urine, picking intermittently at wound  ROS: Limited due cognitive/behavioral     Objective: Vital Signs: Blood pressure 100/60, pulse 92, temperature 97.5 F (36.4 C), temperature source Oral, resp. rate 18, height  (1.575 m), weight 58.2 kg (128 lb 4.9 oz), SpO2 97 %. No results found. No results for input(s): WBC, HGB, HCT, PLT in the last 72 hours. No results for input(s): NA, K, CL, GLUCOSE, BUN, CREATININE, CALCIUM in the last 72 hours.  Invalid input(s): CO CBG (last 3)   Recent Labs  02/23/17 1623 02/23/17 2158 02/24/17 0745  GLUCAP 122* 134* 137*    Wt Readings from Last 3 Encounters:  02/19/17 58.2 kg (128 lb 4.9 oz)  02/01/17 65.6 kg (144 lb 9.6 oz)  07/31/16 68 kg (150 lb)    Physical Exam:    Gen: NAD. Well developed.  HENT: Craniotomy site clean and dry with scab anteriorly--stable Cardiovascular:   RRR Respiratory:  CTA B GI: Soft. Bowel sounds are normal. Neurological: He is alert.  Delayed initiation. Left inattention. Doesn't engage as quickly with the left arm/leg Psychiatric:   Flat but cooperative Skin. Warm and dry. Wound with two pockets of fibronecrotic debris which can be expressed from wound. Minimal erythema. Area doesn't appear overly tender with deep palpation  Assessment/Plan: 1. Cognitive and functional deficits secondary to right traumatic ICH/SDH s/p craniectomy which require 3+ hours per day of interdisciplinary therapy in a comprehensive inpatient rehab setting. Physiatrist is providing close team supervision and 24 hour management of active medical problems listed below. Physiatrist and rehab team continue to assess barriers to discharge/monitor patient progress toward functional and medical goals.  Function:  Bathing Bathing position   Position: Shower  Bathing parts Body parts  bathed by patient: Right arm, Left arm, Chest, Abdomen, Front perineal area, Right upper leg, Left upper leg, Buttocks Body parts bathed by helper: Right lower leg, Left lower leg, Back  Bathing assist Assist Level: Touching or steadying assistance(Pt > 75%)      Upper Body Dressing/Undressing Upper body dressing   What is the patient wearing?: Pull over shirt/dress     Pull over shirt/dress - Perfomed by patient: Thread/unthread right sleeve, Thread/unthread left sleeve, Put head through opening, Pull shirt over trunk Pull over shirt/dress - Perfomed by helper: Pull shirt over trunk        Upper body assist Assist Level: Supervision or verbal cues, Set up   Set up : To obtain clothing/put away  Lower Body Dressing/Undressing Lower body dressing   What is the patient wearing?: Pants, Socks, Shoes     Pants- Performed by patient: Thread/unthread right pants leg, Thread/unthread left pants leg, Pull pants up/down Pants- Performed by helper: Pull pants up/down Non-skid slipper socks- Performed by patient: Don/doff right sock, Don/doff left sock Non-skid slipper socks- Performed by helper: Don/doff right sock, Don/doff left sock   Socks - Performed by helper: Don/doff right sock, Don/doff left sock Shoes - Performed by patient: Don/doff right shoe, Don/doff left shoe, Fasten right Shoes - Performed by helper: Fasten left       TED Hose - Performed by helper: Don/doff right TED hose, Don/doff left TED hose  Lower body assist Assist for lower body dressing: Touching or steadying assistance (Pt > 75%)      Toileting Toileting Toileting activity did not occur: Safety/medical concerns Toileting steps completed  by patient: Performs perineal hygiene Toileting steps completed by helper: Adjust clothing prior to toileting, Performs perineal hygiene, Adjust clothing after toileting Toileting Assistive Devices: Grab bar or rail  Toileting assist Assist level: Touching or steadying  assistance (Pt.75%)   Transfers Chair/bed transfer   Chair/bed transfer method: Ambulatory Chair/bed transfer assist level: Maximal assist (Pt 25 - 49%/lift and lower) Chair/bed transfer assistive device: Armrests Mechanical lift: Scientist, water quality activity did not occur: Safety/medical concerns   Max distance: 35 ft Assist level: Maximal assist (Pt 25 - 49%)   Wheelchair Wheelchair activity did not occur: Safety/medical concerns   Max wheelchair distance: 15' Assist Level: Maximal assistance (Pt 25 - 49%)  Cognition Comprehension Comprehension assist level: Understands basic 25 - 49% of the time/ requires cueing 50 - 75% of the time  Expression Expression assist level: Expresses basic 50 - 74% of the time/requires cueing 25 - 49% of the time. Needs to repeat parts of sentences.  Social Interaction Social Interaction assist level: Interacts appropriately 25 - 49% of time - Needs frequent redirection.  Problem Solving Problem solving assist level: Solves basic less than 25% of the time - needs direction nearly all the time or does not effectively solve problems and may need a restraint for safety  Memory Memory assist level: Recognizes or recalls less than 25% of the time/requires cueing greater than 75% of the time  Medical Problem List and Plan: 1. 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 DVT Prophylaxis/Anticoagulation: Subcutaneous heparin. Monitor platelet counts and any signs of bleeding. 3. Pain Management: Hycet  4. Mood: Ativan as needed 5. Neuropsych: This patient is notcapable of making decisions on hisown behalf.  -HS seroquel for sleep/agitation  -limiting benzos  -continue ritalin for arousal and  initiation-  bid  -enclosure bed for safety still required--renewed  -soft helmet for safety 6. Skin/Wound Care: Routine skin checks  -helmet for cranium protection  -flap site  Intact  -crani site with fibronecrotic/puss-like drainage----empiric keflex started  -paint wound bid with betadine. Attempt to express drainage from pockets as tolerated  -check cbc tomorrow, he's afebrile   7. Fluids/Electrolytes/Nutrition:    -intake reasonable 8.Seizure prophylaxis. Keppra 750 mg twice daily 9.Pneumonia. Five-day course of Ancef  completed 01/22/2017 10.Dysphagia. On D1 thins. Taking meds without issues   11.Urinary retention/Escherichia coli UTI.  Completed course of Cipro.   -still req I/O caths.   -urine with odor again---repeat urine culture pending  -continue urecholine---increased to    -timed voids 12.Diabetes mellitus with peripheral neuropathy. Hemoglobin A1c 10.4.  Patient on Glucophage 500 mg twice a day and Glucotrol 5 mg daily at home  -reasonable control at present  -lantus currently at 15u qhs  -no changes today 13.History of hypertension. Patient on lisinopril 2.5 mg daily prior to admission. Resume as needed  Under control 14.Constipation. Laxative assistance   LOS (Days) 23 A FACE TO FACE EVALUATION WAS PERFORMED  [name], MD 02/24/2017 8:46 AM

## 2017-02-24 NOTE — Progress Notes (Signed)
Occupational Therapy Session Note  Patient Details  Name: [name] MRN: [id number] Date of Birth: Mar 18, 1966  Today's Date: 02/24/2017 OT Individual Time: 1000-1100 OT Individual Time Calculation (min): 60 min    Short Term Goals: Week 2:  OT Short Term Goal 1 (Week 2): Pt will perform toilet transfer with max A of 1 in order to decrease level of assistance needed. OT Short Term Goal 1 - Progress (Week 2): Progressing toward goal OT Short Term Goal 2 (Week 2): Pt will maintain sitting balance with mod A or less for 3 minutes during functional task. OT Short Term Goal 2 - Progress (Week 2): Met OT Short Term Goal 3 (Week 2): Pt will sustain attention during bathing and dressing for 2 minutes with mod multimodal cues. OT Short Term Goal 3 - Progress (Week 2): Met Week 3:  OT Short Term Goal 1 (Week 3): Pt will perform toileting with max A in order to decrease assistance with functional task.  OT Short Term Goal 2 (Week 3): Pt will utilize L UE in functional tasks with mod multimodal cues. OT Short Term Goal 3 (Week 3): Pt will initiate sit <>stand for LB clothing management with mod A.   Skilled Therapeutic Interventions/Progress Updates: 1:1 Pt in bed when arrived pulling up and down pants.  When asked what he was doing - pt replied his pants were twisted and needed to be adjusted - which pt then pulled them up.  Pt with improved initiation to come to EOB when asked and initiated transfer into w/c from bed within reasonable amt of time with min A. Pt brushed teeth at sink with min A with mod cuing for sequencing during teeth brushing. Transitioned via w/c into bathroom and transferred into shower with min A and pt initiating doffing pants with minimal cuing. Continued focus on bilateral hand coordination and isolated left hand coordination with bathing and dressing. Pt demonstrated improvements this week with isolated coordination of left hand however still difficulty with bilateral  UE coordination with grooming tools. Pt assisted with picking out clothing from drawers with mod questioning cues. Pt able to don shirt and pants with minimal cuing for orientation of clothing. Pt demonstrated decr motor planning and coordination with donning socks and shoes requiring max A with these tasks- pt also demonstrates poor postural control during this tasks requiring intermittent mod A to maintain dynamic sitting balance.  Transitioned down to the apartment and perform sorting utensil task - pt requiring max A to follow sorting task but able to identify and demonstrate use of all tools correctly. Pt required mod - max a during this task to sustain his attention at this time due to fatigue.   Passed off to the next therapist.  Pt continues to want to scratch/ pick his cranial scab.     Therapy Documentation Precautions:  Precautions Precautions: Fall, Other (comment) Precaution Comments: bone flap removed R side of head in abdomen; hemet for OOB; Pt no longer with NG or PICC; enclosure bed Required Braces or Orthoses: Other Brace/Splint Other Brace/Splint: helmet for OOB Restrictions Weight Bearing Restrictions: No    Pain:  no pain but continual scratching  of head  See Function Navigator for Current Functional Status.   Therapy/Group: Individual Therapy  [name] [facility] 02/24/2017, 3:13 PM

## 2017-02-24 NOTE — Progress Notes (Signed)
Physical Therapy Session Note  Patient Details  Name: [name] MRN: [id number] Date of Birth: 26-Dec-1965  Today's Date: 02/24/2017 PT Individual Time: 4098-1191 and 1307-1430 PT Individual Time Calculation (min): 48 min and 83 min  Short Term Goals: Week 4:  PT Short Term Goal 1 (Week 4): Pt will consistently ambulate 30 ft with mod assist +1. PT Short Term Goal 2 (Week 4): Pt will initiate functional mobilty tasks with moderate cuing. PT Short Term Goal 3 (Week 4): Pt will negotiate 4 steps with max assist.  Skilled Therapeutic Interventions/Progress Updates:  Treatment 1: Pt received in w/c. Throughout session pt required cuing to not scratch at head and to maintain wearing of his helmet. Pt repeatedly stated "it's causing injury" or "infection" despite educational instruction on need to wear helmet. Session focused on functional mobility tasks (car transfer, sit<>stand transfer, ambulation, & stair negotiation). Pt completed car transfer with assistance 2/2 pt's impulsiveness to crawl into seat. Pt requires cuing to transfer LLE in/out of car. Pt ambulated ortho gym>main gym>day room>room with rest breaks in each area. Pt ambulated with +1 HHA with 2nd person providing intermittent assistance for balance, as well as +2 HHA (pt = 75%) of the work. Pt negotiated 12 steps (6" + 3") with B rails & +2 assist with poor eccentric control initially but improving as pt descended stairs. Pt with impaired placement of LE on each step also. Pt very fatigued despite frequent rest breaks throughout session. Pt returned to room & left in enclosure bed with all needs within reach & family present in room.   Pt with c/o head pain when asked but did not demonstrate behaviors of pain.  Treatment 2: Pt received in bed & agreeable to tx, without c/o pain. Pt in bed without pants or sock on and required max cuing to initiate donning clothing, as well as significantly extra time to complete task. Pt  attempted to put sock on foot that already had sock donned & required max assist for error correction. Pt required max cuing to initiate supine>sit and mod physical assist. Pt donned B shoes with mod assist (assistance with tying shoes and placing R shoe on foot). Pt attempted to tie shoes, actively using BUE during task, and successfully tying 1 shoe. Pt transferred bed>w/c via stand pivot with +2 assist as pt with poor initiation and carryout of task. Pt washed hands at sink with HOH assist to wash LUE also, and to use soap. Transported pt to dayroom where he consumed lunch using RUE. Pt able to consume small bites and appropriately consume liquids with occasional cuing. Pt required cuing 30% of the time to use LUE to stabilize cups to allow himself to remove lid with RUE. After finishing lunch transitioned to [facility] gym where pt utilized dynavision while standing with mod assist for balance. Pt able to stand for up to 2 minutes at a time but required max cuing & encouragement as he would attempt to sit after only a few seconds. Pt attempted to use LUE during task but demonstrates apraxia and impaired control of extremity. Pt transitioned to pipe tree task and required cuing 100% of the time to assemble most simple shape from preselected pieces. Pt became frustrated with inability to disassemble shape as he wanted. Pt then threaded various shapes on string with task focusing on BUE [facility] with more use of RUE than L. At end of session pt ambulated w/c>bed with mod assist +1. Pt left in enclosure bed with all  needs within reach & family present.   Therapy Documentation Precautions:  Precautions Precautions: Fall, Other (comment) Precaution Comments: bone flap removed R side of head in abdomen; hemet for OOB; Pt no longer with NG or PICC; enclosure bed Required Braces or Orthoses: Other Brace/Splint Other Brace/Splint: helmet for OOB Restrictions Weight Bearing Restrictions: No   General: PT Amount of Missed  Time (min): 12 Minutes PT Missed Treatment Reason: Patient fatigue    See Function Navigator for Current Functional Status.   Therapy/Group: Individual Therapy  Sandi Mariscal 02/24/2017, 3:06 PM

## 2017-02-24 NOTE — Plan of Care (Signed)
Problem: RH SKIN INTEGRITY Goal: RH STG SKIN FREE OF INFECTION/BREAKDOWN Outcome: Not Progressing Scalp incision noted with yellow drainage

## 2017-02-25 ENCOUNTER — Inpatient Hospital Stay (HOSPITAL_COMMUNITY): Payer: Self-pay | Admitting: Physical Therapy

## 2017-02-25 ENCOUNTER — Inpatient Hospital Stay (HOSPITAL_COMMUNITY): Payer: Self-pay | Admitting: Speech Pathology

## 2017-02-25 ENCOUNTER — Inpatient Hospital Stay (HOSPITAL_COMMUNITY): Payer: Self-pay | Admitting: Occupational Therapy

## 2017-02-25 ENCOUNTER — Inpatient Hospital Stay (HOSPITAL_COMMUNITY): Payer: Self-pay

## 2017-02-25 DIAGNOSIS — T814XXS Infection following a procedure, sequela: Secondary | ICD-10-CM

## 2017-02-25 LAB — CBC
HEMATOCRIT: 34.7 % — AB (ref 39.0–52.0)
HEMOGLOBIN: 11.8 g/dL — AB (ref 13.0–17.0)
MCH: 29.1 pg (ref 26.0–34.0)
MCHC: 34 g/dL (ref 30.0–36.0)
MCV: 85.5 fL (ref 78.0–100.0)
PLATELETS: 295 10*3/uL (ref 150–400)
RBC: 4.06 MIL/uL — AB (ref 4.22–5.81)
RDW: 13.3 % (ref 11.5–15.5)
WBC: 4.8 10*3/uL (ref 4.0–10.5)

## 2017-02-25 LAB — GLUCOSE, CAPILLARY
GLUCOSE-CAPILLARY: 104 mg/dL — AB (ref 65–99)
GLUCOSE-CAPILLARY: 165 mg/dL — AB (ref 65–99)
Glucose-Capillary: 141 mg/dL — ABNORMAL HIGH (ref 65–99)
Glucose-Capillary: 167 mg/dL — ABNORMAL HIGH (ref 65–99)

## 2017-02-25 LAB — BASIC METABOLIC PANEL
ANION GAP: 9 (ref 5–15)
BUN: 10 mg/dL (ref 6–20)
CHLORIDE: 103 mmol/L (ref 101–111)
CO2: 28 mmol/L (ref 22–32)
Calcium: 9.4 mg/dL (ref 8.9–10.3)
Creatinine, Ser: 0.58 mg/dL — ABNORMAL LOW (ref 0.61–1.24)
GFR calc Af Amer: >60 mL/min (ref >60)
GLUCOSE: 125 mg/dL — AB (ref 65–99)
Potassium: 4.3 mmol/L (ref 3.5–5.1)
Sodium: 140 mmol/L (ref 135–145)

## 2017-02-25 LAB — URINE CULTURE: Culture: >=100000 — AB

## 2017-02-25 MED ORDER — POVIDONE-IODINE 10 % EX SOLN
Freq: Two times a day (BID) | CUTANEOUS | Status: DC
  Administered 2017-02-25 – 2017-03-03 (×13): via TOPICAL
  Administered 2017-03-04 – 2017-03-05 (×3): 1 via TOPICAL
  Administered 2017-03-05 – 2017-03-09 (×9): via TOPICAL
  Administered 2017-03-10: 1 via TOPICAL
  Administered 2017-03-10 – 2017-03-15 (×11): via TOPICAL
  Administered 2017-03-16: 1 via TOPICAL
  Administered 2017-03-16 – 2017-03-18 (×5): via TOPICAL
  Filled 2017-02-25: qty 118

## 2017-02-25 NOTE — Progress Notes (Signed)
[place] PHYSICAL MEDICINE & REHABILITATION     PROGRESS NOTE    Subjective/Complaints: Slept last night. Still with drainage from wound. No fevers  ROS: pt denies nausea, vomiting, diarrhea, cough, shortness of breath or chest pain     Objective: Vital Signs: Blood pressure 118/68, pulse 87, temperature 98.6 F (37 C), temperature source Oral, resp. rate 18, height  (1.575 m), weight 58.7 kg (129 lb 6.6 oz), SpO2 98 %. No results found. No results for input(s): WBC, HGB, HCT, PLT in the last 72 hours. No results for input(s): NA, K, CL, GLUCOSE, BUN, CREATININE, CALCIUM in the last 72 hours.  Invalid input(s): CO CBG (last 3)   Recent Labs  02/24/17 1644 02/24/17 2054 02/25/17 0624  GLUCAP 146* 135* 141*    Wt Readings from Last 3 Encounters:  02/25/17 58.7 kg (129 lb 6.6 oz)  02/01/17 65.6 kg (144 lb 9.6 oz)  07/31/16 68 kg (150 lb)    Physical Exam:    Gen: NAD. Well developed.  HENT:  Craniectomy scar/wound Cardiovascular:   RRR Respiratory:  CTA B GI: Soft. Bowel sounds are normal. Neurological: He is alert.  Delayed initiation. Left inattention. Doesn't engage as quickly with the left arm/leg Psychiatric:   Flat but cooperative Skin. Warm and dry. Wound still with two areas of scab/opening with fibronecrotic debris still expressible from wound. Minimal erythema. Only mildly tender  Assessment/Plan: 1. Cognitive and functional deficits secondary to right traumatic ICH/SDH s/p craniectomy which require 3+ hours per day of interdisciplinary therapy in a comprehensive inpatient rehab setting. Physiatrist is providing close team supervision and 24 hour management of active medical problems listed below. Physiatrist and rehab team continue to assess barriers to discharge/monitor patient progress toward functional and medical goals.  Function:  Bathing Bathing position   Position: Shower  Bathing parts Body parts bathed by patient: Right arm, Left  arm, Chest, Abdomen, Front perineal area, Right upper leg, Left upper leg, Buttocks, Right lower leg Body parts bathed by helper: Left lower leg, Back, Buttocks  Bathing assist Assist Level: Touching or steadying assistance(Pt > 75%)      Upper Body Dressing/Undressing Upper body dressing   What is the patient wearing?: Pull over shirt/dress     Pull over shirt/dress - Perfomed by patient: Thread/unthread right sleeve, Thread/unthread left sleeve, Put head through opening, Pull shirt over trunk Pull over shirt/dress - Perfomed by helper: Pull shirt over trunk        Upper body assist Assist Level: Supervision or verbal cues   Set up : To obtain clothing/put away  Lower Body Dressing/Undressing Lower body dressing   What is the patient wearing?: Pants, Socks, Shoes     Pants- Performed by patient: Thread/unthread right pants leg, Thread/unthread left pants leg, Pull pants up/down Pants- Performed by helper: Pull pants up/down Non-skid slipper socks- Performed by patient: Don/doff right sock, Don/doff left sock Non-skid slipper socks- Performed by helper: Don/doff right sock, Don/doff left sock   Socks - Performed by helper: Don/doff right sock, Don/doff left sock Shoes - Performed by patient: Don/doff right shoe, Don/doff left shoe Shoes - Performed by helper: Fasten right, Fasten left       TED Hose - Performed by helper: Don/doff right TED hose, Don/doff left TED hose  Lower body assist Assist for lower body dressing: Touching or steadying assistance (Pt > 75%)      Toileting Toileting Toileting activity did not occur: Safety/medical concerns Toileting steps completed by patient: Performs  perineal hygiene Toileting steps completed by helper: Adjust clothing prior to toileting, Performs perineal hygiene, Adjust clothing after toileting Toileting Assistive Devices: Grab bar or rail  Toileting assist Assist level: Touching or steadying assistance (Pt.75%)    Transfers Chair/bed transfer   Chair/bed transfer method: Ambulatory Chair/bed transfer assist level: Moderate assist (Pt 50 - 74%/lift or lower) Chair/bed transfer assistive device: Armrests Mechanical lift: Scientist, water quality activity did not occur: Safety/medical concerns   Max distance: 150 ft  Assist level: 2 helpers   Education administrator activity did not occur: Safety/medical concerns   Max wheelchair distance: 15' Assist Level: Maximal assistance (Pt 25 - 49%)  Cognition Comprehension Comprehension assist level: Understands basic 25 - 49% of the time/ requires cueing 50 - 75% of the time  Expression Expression assist level: Expresses basic 25 - 49% of the time/requires cueing 50 - 75% of the time. Uses single words/gestures.  Social Interaction Social Interaction assist level: Interacts appropriately 25 - 49% of time - Needs frequent redirection.  Problem Solving Problem solving assist level: Solves basic less than 25% of the time - needs direction nearly all the time or does not effectively solve problems and may need a restraint for safety  Memory Memory assist level: Recognizes or recalls less than 25% of the time/requires cueing greater than 75% of the time  Medical Problem List and Plan: 1. 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 DVT Prophylaxis/Anticoagulation: Subcutaneous heparin. Monitor platelet counts and any signs of bleeding. 3. Pain Management: Hycet  4. Mood: Ativan as needed 5. Neuropsych: This patient is notcapable of making decisions on hisown behalf.  -HS seroquel for sleep/agitation  -limiting benzos  -continue ritalin for arousal and initiation-  bid  -enclosure bed for safety still required-renewed  -soft helmet for safety 6.  Skin/Wound Care: Routine skin checks  -helmet for cranium protection  -flap site  Intact  -crani site with fibronecrotic/puss-like drainage----empiric keflex started on 02/23/17  -continue to paint wound bid with betadine. express drainage from pockets as able  -cbc pending today   7. Fluids/Electrolytes/Nutrition:    -intake reasonable 8.Seizure prophylaxis. Keppra 750 mg twice daily 9.Pneumonia. Five-day course of Ancef  completed 01/22/2017 10.Dysphagia. On D1 thins. Taking meds without issues   11.Urinary retention/Escherichia coli UTI.  Completed course of Cipro.   -still req I/O caths 300-500cc.   -urine with odor again---repeat urine culture with 100k coag neg staph  -continue urecholine---increased to    -timed voids 12.Diabetes mellitus with peripheral neuropathy. Hemoglobin A1c 10.4.  Patient on Glucophage 500 mg twice a day and Glucotrol 5 mg daily at home  -reasonable control at present  -lantus currently at 15u qhs  -no changes today 13.History of hypertension. Patient on lisinopril 2.5 mg daily prior to admission. Resume as needed  Under control 14.Constipation. Laxative assistance   LOS (Days) 24 A FACE TO FACE EVALUATION WAS PERFORMED  [name], MD 02/25/2017 7:40 AM

## 2017-02-25 NOTE — Progress Notes (Signed)
Occupational Therapy Session Note  Patient Details  Name: [name] MRN: [id number] Date of Birth: [birth year]  Today's Date: 02/25/2017 OT Individual Time: 8295-6213 OT Individual Time Calculation (min): 26 min    Short Term Goals: Week 3:  OT Short Term Goal 1 (Week 3): Pt will perform toileting with max A in order to decrease assistance with functional task.  OT Short Term Goal 2 (Week 3): Pt will utilize L UE in functional tasks with mod multimodal cues. OT Short Term Goal 3 (Week 3): Pt will initiate sit <>stand for LB clothing management with mod A.   Skilled Therapeutic Interventions/Progress Updates:    OT treatment session focused on cognitive retraining and B UE coordination. Pt supine in enclosed bed upon OT arrival, interpreter present throughout session. Pt required assistance to orient helmet once he placed it on his head and assistance to snap chin strap. Pt came to sitting EOB with verbal and tactile cues to initiate bed mobility. B UE coordination task using graded beading activity using beads of different shapes and colors. Pt able to name color and shapes accurately 75%. With demonstration, pt able to thread 3 beads, then became increasingly confused, pulling the string instead of the bead. Provided hand-over hand assist to coordinate L hand. Pt then able to create 3 color pattern with min cues and increased time.  Pt returned to supine, helmet removed by OT, then nurse tech entered room to take vitals, pt left in nurse tech care.   Therapy Documentation Precautions:  Precautions Precautions: Fall, Other (comment) Precaution Comments: bone flap removed R side of head in abdomen; hemet for OOB; Pt no longer with NG or PICC; enclosure bed Required Braces or Orthoses: Other Brace/Splint Other Brace/Splint: helmet for OOB Restrictions Weight Bearing Restrictions: No Pain: Pain Assessment Pain Assessment: No/denies pain  See Function Navigator for Current  Functional Status.   Therapy/Group: Individual Therapy  [name] 02/25/2017, 2:29 PM

## 2017-02-25 NOTE — Plan of Care (Signed)
Problem: RH SKIN INTEGRITY Goal: RH STG SKIN FREE OF INFECTION/BREAKDOWN Outcome: Not Progressing Pt on antibiotic for scalp wounds; pt keeps on picking wounds

## 2017-02-25 NOTE — Progress Notes (Signed)
Speech Language Pathology Daily Session Note  Patient Details  Name: [name] MRN: [id number] Date of Birth: May 07, 1966  Today's Date: 02/25/2017 SLP Individual Time: 4696-2952 SLP Individual Time Calculation (min): 55 min  Short Term Goals: Week 4: SLP Short Term Goal 1 (Week 4): Patient will consume current diet with minimal overt s/s of aspiration with Min A verbal cues for use of swallowing compensatory strategies.  SLP Short Term Goal 2 (Week 4): Patient will demonstrate sustained attention to a functional task for ~2 minutes with Min A verbal cues for redirection.  SLP Short Term Goal 3 (Week 4): Patient will orient to place, situation and time with Max A multimodal cues.  SLP Short Term Goal 4 (Week 4): Patient will demonstrate basic problem solving for basic tasks with Max A verbal cues.  SLP Short Term Goal 5 (Week 4): Patient will demonstrate efficient mastication with mild oral residue and minimal overt s/s of aspiration with Mod A verbal cues over 2 sessions prior to upgrade.   Skilled Therapeutic Interventions: Skilled treatment session focused on dysphagia and cognitive goals. SLP facilitated session with trials of Dys. 2 textures. Patient demonstrated intermittent oral holding with prolonged mastication and utilization of liquid washes that led to intermittent swallowing of bolus without mastication. Recommend patient continue current diet with trials with SLP only. Patient was consistently oriented to place (hospital) and city but required total A for orientation to time and Max A for orientation to situation. Patient also required Max-Total A verbal cues to follow simple commands within basic but unfamiliar novel tasks. Throughout session, patient required total A for immediate and short-term recall of information. Patient left upright in wheelchair with all needs within reach and sister present. Continue with current plan of care.      Function:  Eating Eating    Modified Consistency Diet: Yes Eating Assist Level: Set up assist for;Supervision or verbal cues;Helper checks for pocketed food   Eating Set Up Assist For: Opening containers Helper Scoops Food on Utensil: Occasionally Helper Brings Food to Mouth: Occasionally   Cognition Comprehension Comprehension assist level: Understands basic 25 - 49% of the time/ requires cueing 50 - 75% of the time  Expression   Expression assist level: Expresses basic 25 - 49% of the time/requires cueing 50 - 75% of the time. Uses single words/gestures.  Social Interaction Social Interaction assist level: Interacts appropriately 25 - 49% of time - Needs frequent redirection.  Problem Solving Problem solving assist level: Solves basic less than 25% of the time - needs direction nearly all the time or does not effectively solve problems and may need a restraint for safety  Memory Memory assist level: Recognizes or recalls less than 25% of the time/requires cueing greater than 75% of the time    Pain Pain Assessment Pain Assessment: No/denies pain  Therapy/Group: Individual Therapy  Saanvi Hakala 02/25/2017, 3:14 PM

## 2017-02-25 NOTE — Progress Notes (Signed)
Occupational Therapy Session Note  Patient Details  Name: [name] MRN: [id number] Date of Birth: 1966/07/02  Today's Date: 02/25/2017 OT Individual Time: 1000-1100 OT Individual Time Calculation (min): 60 min    Short Term Goals: Week 2:  OT Short Term Goal 1 (Week 2): Pt will perform toilet transfer with max A of 1 in order to decrease level of assistance needed. OT Short Term Goal 1 - Progress (Week 2): Progressing toward goal OT Short Term Goal 2 (Week 2): Pt will maintain sitting balance with mod A or less for 3 minutes during functional task. OT Short Term Goal 2 - Progress (Week 2): Met OT Short Term Goal 3 (Week 2): Pt will sustain attention during bathing and dressing for 2 minutes with mod multimodal cues. OT Short Term Goal 3 - Progress (Week 2): Met Week 3:  OT Short Term Goal 1 (Week 3): Pt will perform toileting with max A in order to decrease assistance with functional task.  OT Short Term Goal 2 (Week 3): Pt will utilize L UE in functional tasks with mod multimodal cues. OT Short Term Goal 3 (Week 3): Pt will initiate sit <>stand for LB clothing management with mod A.   Skilled Therapeutic Interventions/Progress Updates: 1:1 Pt in w/c requested to use the commode. Pt transferred min A with grab bar to the toilet and performed toileting with mod A. Pt void a large volume of urine and double voided. Pt transitioned into the shower from toilet with mod A with grab bar. Pt bathed with overall mod A with mod cues for sequencing and functional use of left hand. Pt demonstrate with increased grasped left hand with difficulty opening hand to perform a bilateral tasks. Pt dressed today with improved accuracy. Pt's sister brought in pictures and pt able to answer biographical information with min cuing. Pt not oriented to time (his age or the age of others). Transferred into bed to rest at end of session.      Therapy Documentation Precautions:  Precautions Precautions:  Fall, Other (comment) Precaution Comments: bone flap removed R side of head in abdomen; hemet for OOB; Pt no longer with NG or PICC; enclosure bed Required Braces or Orthoses: Other Brace/Splint Other Brace/Splint: helmet for OOB Restrictions Weight Bearing Restrictions: No Pain: Pain Assessment Pain Assessment: No/denies pain  See Function Navigator for Current Functional Status.   Therapy/Group: Individual Therapy  [name] [employer] 02/25/2017, 4:04 PM

## 2017-02-25 NOTE — Progress Notes (Addendum)
Physical Therapy Note  Patient Details  Name: [name] MRN: [id number] Date of Birth: November 13, 1965 Today's Date: 02/25/2017  1300-1400, 60 min  Pain: none per pt  Pt compliant with placing helmet on head before standing , 2x during session.  Gait x 60' with bil HHA , PT in front of him, on level tile,  x 60' at end of session with mod assist on straight path, max assist to make R turn due to excessive L lean.  BOS is wide, with steppage gait LLE. PT supervised pt while he ate lunch, with cueing to to put spoon down q 2-3 bites, with delayed swallow, but safe before taking another bite.  In sitting, pt kicked beach ball on command with R/L feet x 10 x 2, and caught and passed ball using bil UEs x 15 x 1,  With forced use of L hand, pt volleyed/tapped beach ball with good excursion and power, x 10 x 2.  Pt left resting in [place] bed with all needs within reach.  See function navigator for current status.    Karianne Nogueira 02/25/2017, 2:01 PM

## 2017-02-26 ENCOUNTER — Inpatient Hospital Stay (HOSPITAL_COMMUNITY): Payer: Self-pay | Admitting: Occupational Therapy

## 2017-02-26 ENCOUNTER — Inpatient Hospital Stay (HOSPITAL_COMMUNITY): Payer: Self-pay | Admitting: Speech Pathology

## 2017-02-26 ENCOUNTER — Inpatient Hospital Stay (HOSPITAL_COMMUNITY): Payer: Self-pay | Admitting: *Deleted

## 2017-02-26 DIAGNOSIS — A499 Bacterial infection, unspecified: Secondary | ICD-10-CM

## 2017-02-26 DIAGNOSIS — N390 Urinary tract infection, site not specified: Secondary | ICD-10-CM

## 2017-02-26 LAB — GLUCOSE, CAPILLARY
GLUCOSE-CAPILLARY: 153 mg/dL — AB (ref 65–99)
GLUCOSE-CAPILLARY: 111 mg/dL — AB (ref 65–99)
GLUCOSE-CAPILLARY: 126 mg/dL — AB (ref 65–99)
Glucose-Capillary: 98 mg/dL (ref 65–99)

## 2017-02-26 MED ORDER — NITROFURANTOIN MONOHYD MACRO 100 MG PO CAPS
100.0000 mg | ORAL_CAPSULE | Freq: Two times a day (BID) | ORAL | Status: AC
  Administered 2017-02-26 – 2017-03-04 (×14): 100 mg via ORAL
  Filled 2017-02-26 (×14): qty 1

## 2017-02-26 NOTE — Progress Notes (Signed)
Physical Therapy Session Note  Patient Details  Name: [name] MRN: [id number] Date of Birth: [birth year]  Today's Date: 02/25/2017 PT Individual Time:1445-1545   52mn  Short Term Goals: Week 4:  PT Short Term Goal 1 (Week 4): Pt will consistently ambulate 30 ft with mod assist +1. PT Short Term Goal 2 (Week 4): Pt will initiate functional mobilty tasks with moderate cuing. PT Short Term Goal 3 (Week 4): Pt will negotiate 4 steps with max assist.  Skilled Therapeutic Interventions/Progress Updates:    Pt received supine in bed. No translator present on this day. Sister present to assist with translation and encourage participation.  Supine>sit transfer with min assist and max cues for participation Stand [facility] due to poor problem solving and awareness of WC.  PT instructed pt in gait training x 641fwith 2 turns and Mod assist with forearm support. Pt engaged in kicking soccer ball throughout gait for improved step length. Step-to gait pattern throughout expect when kicking ball with LLE. Nustep reciprocal movement training and sustained attention tasks x 8 minutes with 1 rest break. Pt noted to become frustrated with helmet at end of nustep training and refused any more activity on nustep unless he could take helmet off. Pt re-directed pt and reduced agitation over helmet [facility] from [facility]. [facility] to [facility] to perform dynavision. Attempted to perform in standing but pt refused after 15 seconds standing due to irritation from helmet. Perform x 2 In sitting. program A for 2 min throughout with 3 rings. Pt hit 7 targets in standing, 25 on first bout sitting and 37 on 2nd bout sitting. Pt returned to room and performed squat pivot transfer to bed with mod. Sit>supine completed with mod assist and left supine in enclosure bed with sister present and all needs met.     Therapy Documentation Precautions:   Precautions Precautions: Fall, Other (comment) Precaution Comments: bone flap removed R side of head in abdomen; hemet for OOB; Pt no longer with NG or PICC; enclosure bed Required Braces or Orthoses: Other Brace/Splint Other Brace/Splint: helmet for OOB Restrictions Weight Bearing Restrictions: No Vital Signs: Therapy Vitals Temp: 98 F (36.7 C) Temp Source: Oral Pulse Rate: 90 Resp: 18 BP: 100/66 Patient Position (if appropriate): Lying Oxygen Therapy SpO2: 98 % O2 Device: Not Delivered Pain: Faces. Hurts a little.   See Function Navigator for Current Functional Status.   Therapy/Group: Individual Therapy  AuLorie Phenix/27/2018, 6:06 AM

## 2017-02-26 NOTE — Progress Notes (Signed)
Speech Language Pathology Daily Session Note  Patient Details  Name: [name] MRN: [id number] Date of Birth: April 22, 1966  Today's Date: 02/26/2017 SLP Individual Time: 0800-0900 SLP Individual Time Calculation (min): 60 min  Short Term Goals: Week 4: SLP Short Term Goal 1 (Week 4): Patient will consume current diet with minimal overt s/s of aspiration with Min A verbal cues for use of swallowing compensatory strategies.  SLP Short Term Goal 2 (Week 4): Patient will demonstrate sustained attention to a functional task for ~2 minutes with Min A verbal cues for redirection.  SLP Short Term Goal 3 (Week 4): Patient will orient to place, situation and time with Max A multimodal cues.  SLP Short Term Goal 4 (Week 4): Patient will demonstrate basic problem solving for basic tasks with Max A verbal cues.  SLP Short Term Goal 5 (Week 4): Patient will demonstrate efficient mastication with mild oral residue and minimal overt s/s of aspiration with Mod A verbal cues over 2 sessions prior to upgrade.   Skilled Therapeutic Interventions:  Skilled treatment session focused on cognitive and dysphagia goals. Upon arrival, patient was supine in enclosure bed and required extra time and Min A verbal cues to donn pants and sit EOB and transfer to the wheelchair.  Patient required Max A and choices from a field of 2 for orientation to time, place and situation with decreased confabulation and language of confusion noted. Patient also performed self-care tasks at the sink with Mod-Max A verbal cues for problem solving and initiation but demonstrated decreased apraxia when using BUE with grooming tools. Patient consumed breakfast meal of Dys. 1 textures with thin liquids without overt s/s of aspiration and Mod A verbal cues for small bites/sips and was able to set-up his own tray today with Min A verbal cues.  Patient transferred back to enclosure bed at end of session with Max A and call bell within reach.  Continue with current plan of care.      Function:  Eating Eating   Modified Consistency Diet: Yes Eating Assist Level: More than reasonable amount of time;Supervision or verbal cues           Cognition Comprehension Comprehension assist level: Understands basic 25 - 49% of the time/ requires cueing 50 - 75% of the time  Expression   Expression assist level: Expresses basic 25 - 49% of the time/requires cueing 50 - 75% of the time. Uses single words/gestures.  Social Interaction Social Interaction assist level: Interacts appropriately 25 - 49% of time - Needs frequent redirection.  Problem Solving Problem solving assist level: Solves basic 25 - 49% of the time - needs direction more than half the time to initiate, plan or complete simple activities  Memory Memory assist level: Recognizes or recalls less than 25% of the time/requires cueing greater than 75% of the time    Pain No/Denies Pain   Therapy/Group: Individual Therapy  Labrenda Lasky 02/26/2017, 3:30 PM

## 2017-02-26 NOTE — Progress Notes (Signed)
Pt constantly picking at scalp incision/scabs on head. Pulled large scab off incision area at front of scalp/top of head. Painted with betadine and reminded pt not to pick at head. Reports understanding however will go right back to it. Refuses to wear helmet or dressing on head to cover wound.[name], [name]

## 2017-02-26 NOTE — Progress Notes (Signed)
Occupational Therapy Weekly Progress Note  Patient Details  Name: [name] MRN: [id number] Date of Birth: 1965-11-25  Beginning of progress report period: February 17, 2017 End of progress report period: February 26, 2017   Patient has met 3 of 3 short term goals.  Pt is progressing well over this last week. Pt can now perform stand pivot/ squat pivot transfers with min to mod A with extra time.  Pt demonstrating improved initiation with functional mobility in context of an ADL task. Pt is consistently not oriented to time, date, place or situation. Pt can bathe at shower level with min A and dressing with min A- mod A sit to stand. Pt still requires A to orient clothing prior to donning. Pt can ambulated with the rail on the wall with mod A. Pt continues to demonstrated apraxia and poor coordination/ motor planning with left hand with decr awareness or ability to self correct without intervention. Currently, patient demonstrates behaviors consistent with a Rancho Level V and requires overall Max-Total A for cogntion.  However, patient demonstrates increased verbal expression and social engagement but also demonstrates increased language of confusion and confabulation  Patient continues to demonstrate the following deficits: muscle weakness, decreased cardiorespiratoy endurance, impaired timing and sequencing, abnormal tone, unbalanced muscle activation, motor apraxia, decreased coordination and decreased motor planning, decreased midline orientation, decreased attention to right and decreased motor planning, decreased initiation, decreased attention, decreased awareness, decreased problem solving, decreased safety awareness, decreased memory and delayed processing and decreased standing balance, decreased postural control, hemiplegia, decreased balance strategies and difficulty maintaining precautions and therefore will continue to benefit from skilled OT intervention to enhance overall  performance with BADL and Reduce care partner burden.  Patient progressing toward long term goals..  Continue plan of care.  OT Short Term Goals Week 3:  OT Short Term Goal 1 (Week 3): Pt will perform toileting with max A in order to decrease assistance with functional task.  OT Short Term Goal 1 - Progress (Week 3): Met OT Short Term Goal 2 (Week 3): Pt will utilize L UE in functional tasks with mod multimodal cues. OT Short Term Goal 2 - Progress (Week 3): Met OT Short Term Goal 3 (Week 3): Pt will initiate sit <>stand for LB clothing management with mod A.  OT Short Term Goal 3 - Progress (Week 3): Met Week 4:  OT Short Term Goal 1 (Week 4): Pt will transfer to toilet with min A with family member OT Short Term Goal 2 (Week 4): Pt will demonstrate intellecual awarenss with mod A (of at least two things) OT Short Term Goal 3 (Week 4): Pt will transfer into tub shower without grab bars with LRAD with min A  OT Short Term Goal 4 (Week 4): Pt will be oriented x3 with external aids with mod A  Skilled Therapeutic Interventions/Progress Updates: continued with POC     Therapy Documentation Precautions:  Precautions Precautions: Fall, Other (comment) Precaution Comments: bone flap removed R side of head in abdomen; hemet for OOB; Pt no longer with NG or PICC; enclosure bed Required Braces or Orthoses: Other Brace/Splint Other Brace/Splint: helmet for OOB Restrictions Weight Bearing Restrictions: No   See Function Navigator for Current Functional Status.   Therapy/Group: Individual Therapy  [name] 02/26/2017, 1:50 PM

## 2017-02-26 NOTE — Progress Notes (Addendum)
Occupational Therapy Session Note  Patient Details  Name: [name] MRN: [id number] Date of Birth: 1966-01-19  Today's Date: 02/26/2017 OT Individual Time: 4098-1191 and 4782-9562 OT Individual Time Calculation (min): 64 min and 95 min   Skilled Therapeutic Interventions/Progress Updates: Skilled OT session completed with focus on ADL retraining, functional transfers, and cognitive remediation. Pt in enclosure bed with interpretor present at time of arrival. Pt requires options to select meaningful therapeutic activity. Today he was agreeable to shower. Pt required instructional cues to retrieve 1/4 necessary clothing items. He tried donning pants, cued him that he needed to shower first. Pt bathed while on tub bench with helmet donned. Pt still requires cues to attend to bilateral UEs/LEs as he will continue to wash chest/face and say that he is done. Sit<stand for pericare with Min A, pt able to wash buttocks himself. Dressing completed w/c level at sink, cuing for lifting pants up entirely. Oral care completed in standing with Min-Mod A and instructional cues to terminate tasks. Pt left in w/c with helmet and safety belt donned anticipating next therapy. Sister present.   Extra time provided throughout for pt to problem solve in controlled environment.   All transfers completed with Min-Mod A via stand pivot.   2nd Session 1:1 tx (95 min) Skilled OT session completed with focus on ADL retraining, cognitive remediation, and balance. Pt lying in enclosure bed at time of arrival, had doffed TED stockings and shoes from this morning's session. Had him practice donning/doffing shoes and tying laces. With extra time, pt able to don footwear and tie laces at EOB! Tactile cues and visual demonstrations required. Pt then transferred to w/c with Min A and helmet. Had him practice clasping helmet with use of mirror for visual feedback, which he was able to do! Pt then responded yes when asked  if he needed to use bathroom. Stand pivot with Min A and grab bars to toilet. He successfully voided bladder, initiated hygiene completion. Max cues for lifting pants entirely over hips, with multiple sit<stands to complete himself. Hand washing completed while standing at sink for balance without UE support. Afterwards pt sidestepping away from sink and verbalizing desire to return to bed. Pt ambulated with Max A HHA to enclosure bed, OT in front of pt guiding forearms, w/c follow from interpretor. At this time, sister arrived. Had him practice doffing/donning shoes for her to see at EOB. At this time, pt very fatigued and required max cues to maintain attention to task. Tried to further engage/motivate him with his favorite music, however this appeared to over-stimulate him. OT assist was required for footwear after given significant time to complete himself. Pt verbalized desire to sit up after therapy. He was transferred to [facility] with safety belt and helmet donned. Pt left with sister at time of departure, reclined in w/c.      Therapy Documentation Precautions:  Precautions Precautions: Fall, Other (comment) Precaution Comments: bone flap removed R side of head in abdomen; hemet for OOB; Pt no longer with NG or PICC; enclosure bed Required Braces or Orthoses: Other Brace/Splint Other Brace/Splint: helmet for OOB Restrictions Weight Bearing Restrictions: No   Pain: No c/o pain during tx    ADL:  :    See Function Navigator for Current Functional Status.   Therapy/Group: Individual Therapy  Naava Janeway A Shigeko Manard 02/26/2017, 12:55 PM

## 2017-02-26 NOTE — Progress Notes (Signed)
Physical Therapy Session Note  Patient Details  Name: [name] MRN: [id number] Date of Birth: 1966/01/26  Today's Date: 02/26/2017 PT Individual Time: 1105-1205 PT Individual Time Calculation (min): 60 min   Short Term Goals: Week 2:  PT Short Term Goal 1 (Week 2): Pt will consistently participate in supine<>sitting with max assist +1. PT Short Term Goal 1 - Progress (Week 2): Not met PT Short Term Goal 2 (Week 2): Pt will stand with max assist +1. PT Short Term Goal 2 - Progress (Week 2): Not met PT Short Term Goal 3 (Week 2): Pt will consistently participate in bed<>w/c transfers with max assist +1. PT Short Term Goal 3 - Progress (Week 2): Progressing toward goal Week 3:  PT Short Term Goal 1 (Week 3): Pt will stand with max assist +1.  PT Short Term Goal 1 - Progress (Week 3): Met PT Short Term Goal 2 (Week 3): Pt will intiate all functional mobility tasks 50% of the time. PT Short Term Goal 2 - Progress (Week 3): Not met PT Short Term Goal 3 (Week 3): Pt will ambulate 10 ft with max assist +1. PT Short Term Goal 3 - Progress (Week 3): Met Week 4:  PT Short Term Goal 1 (Week 4): Pt will consistently ambulate 30 ft with mod assist +1. PT Short Term Goal 2 (Week 4): Pt will initiate functional mobilty tasks with moderate cuing. PT Short Term Goal 3 (Week 4): Pt will negotiate 4 steps with max assist.  Skilled Therapeutic Interventions/Progress Updates:  Tx focused on gait training, NMR for cognitive remediation, and functional mobility tasks. Pt up in [facility], ready to go. Pt needed Mod reminders x6 throughout to keep/put helmet on, but he was easily redirected and agreeable to do so. Interpreter present.    Sit<>stand x6 throughout tx with min/mod A for initiation and hand placement.  Gait training in controlled settings 3x25' with bil forearm support and 1x100' with shopping cart, all with +2 for WC follow or steering cart. Gait and cadence markedly improved with cart  although pt had difficulty with maintaining proximity to self.  Nustep x6 min with all 4 extremities (LUE grip assist) for continuous reciprocal movement in coordinated pattern. Duration limited by fatigue.  Attempted standing functional nut/bolt fitting task, but pt fatigued and only able to stand x1 min. Completed additional 3 min in sitting for bil UE [place], but then declared he was finished.  Assisted pt with handwashing in room with max/total cues for task completion. Pt left up in [facility] with lap belt at RN station.   Therapy Documentation Precautions:  Precautions Precautions: Fall, Other (comment) Precaution Comments: bone flap removed R side of head in abdomen; hemet for OOB; Pt no longer with NG or PICC; enclosure bed Required Braces or Orthoses: Other Brace/Splint Other Brace/Splint: helmet for OOB Restrictions Weight Bearing Restrictions: No    Pain: none   See Function Navigator for Current Functional Status.   Therapy/Group: Individual Therapy  Eymi Lipuma Soundra Pilon, PT, DPT  02/26/2017, 12:24 PM

## 2017-02-26 NOTE — Progress Notes (Signed)
[place] PHYSICAL MEDICINE & REHABILITATION     PROGRESS NOTE    Subjective/Complaints: No new issues overnight. Up with therapy already.   ROS: Limited due cognitive/behavioral      Objective: Vital Signs: Blood pressure 100/66, pulse 90, temperature 98 F (36.7 C), temperature source Oral, resp. rate 18, height  (1.575 m), weight 58.7 kg (129 lb 6.6 oz), SpO2 98 %. No results found.  Recent Labs  02/25/17 0635  WBC 4.8  HGB 11.8*  HCT 34.7*  PLT 295    Recent Labs  02/25/17 0635  NA 140  K 4.3  CL 103  GLUCOSE 125*  BUN 10  CREATININE 0.58*  CALCIUM 9.4   CBG (last 3)   Recent Labs  02/25/17 1727 02/25/17 2051 02/26/17 0637  GLUCAP 165* 167* 153*    Wt Readings from Last 3 Encounters:  02/25/17 58.7 kg (129 lb 6.6 oz)  02/01/17 65.6 kg (144 lb 9.6 oz)  07/31/16 68 kg (150 lb)    Physical Exam:    Gen: NAD. Well developed.  HENT:  Craniectomy scar  Cardiovascular:   RRR Respiratory:  CTA B GI: Soft. Bowel sounds are normal. Neurological: He is alert.  Delayed initiation. Left inattention. Doesn't engage as quickly with the left arm/leg Psychiatric:   Flat but cooperative Skin. Warm and dry. Craniectomy wound still with two areas of scab/opening with some fibronecrotic debris slightly expressible from wound.  r  Assessment/Plan: 1. Cognitive and functional deficits secondary to right traumatic ICH/SDH s/p craniectomy which require 3+ hours per day of interdisciplinary therapy in a comprehensive inpatient rehab setting. Physiatrist is providing close team supervision and 24 hour management of active medical problems listed below. Physiatrist and rehab team continue to assess barriers to discharge/monitor patient progress toward functional and medical goals.  Function:  Bathing Bathing position   Position: Shower  Bathing parts Body parts bathed by patient: Right arm, Left arm, Chest, Abdomen, Front perineal area, Right upper leg,  Left upper leg, Buttocks, Right lower leg Body parts bathed by helper: Left lower leg, Back, Buttocks  Bathing assist Assist Level: Touching or steadying assistance(Pt > 75%)      Upper Body Dressing/Undressing Upper body dressing   What is the patient wearing?: Pull over shirt/dress     Pull over shirt/dress - Perfomed by patient: Thread/unthread right sleeve, Thread/unthread left sleeve, Put head through opening, Pull shirt over trunk Pull over shirt/dress - Perfomed by helper: Pull shirt over trunk        Upper body assist Assist Level: Supervision or verbal cues   Set up : To obtain clothing/put away  Lower Body Dressing/Undressing Lower body dressing   What is the patient wearing?: Pants, Socks, Shoes     Pants- Performed by patient: Thread/unthread right pants leg, Thread/unthread left pants leg, Pull pants up/down Pants- Performed by helper: Pull pants up/down Non-skid slipper socks- Performed by patient: Don/doff right sock, Don/doff left sock Non-skid slipper socks- Performed by helper: Don/doff right sock, Don/doff left sock   Socks - Performed by helper: Don/doff right sock, Don/doff left sock Shoes - Performed by patient: Don/doff right shoe, Don/doff left shoe Shoes - Performed by helper: Fasten right, Fasten left       TED Hose - Performed by helper: Don/doff right TED hose, Don/doff left TED hose  Lower body assist Assist for lower body dressing: Touching or steadying assistance (Pt > 75%)      Toileting Toileting Toileting activity did not occur: Safety/medical  concerns Toileting steps completed by patient: Performs perineal hygiene Toileting steps completed by helper: Adjust clothing prior to toileting, Performs perineal hygiene, Adjust clothing after toileting Toileting Assistive Devices: Grab bar or rail  Toileting assist Assist level: Touching or steadying assistance (Pt.75%)   Transfers Chair/bed transfer   Chair/bed transfer method:  Ambulatory Chair/bed transfer assist level: Moderate assist (Pt 50 - 74%/lift or lower) Chair/bed transfer assistive device: Other (PT's hands) Mechanical lift: Scientist, water quality activity did not occur: Safety/medical concerns   Max distance: 60 Assist level: Maximal assist (Pt 25 - 49%)   Wheelchair Wheelchair activity did not occur: Safety/medical concerns   Max wheelchair distance: 10 Assist Level: Maximal assistance (Pt 25 - 49%)  Cognition Comprehension Comprehension assist level: Understands basic 25 - 49% of the time/ requires cueing 50 - 75% of the time  Expression Expression assist level: Expresses basic 25 - 49% of the time/requires cueing 50 - 75% of the time. Uses single words/gestures.  Social Interaction Social Interaction assist level: Interacts appropriately 25 - 49% of time - Needs frequent redirection.  Problem Solving Problem solving assist level: Solves basic less than 25% of the time - needs direction nearly all the time or does not effectively solve problems and may need a restraint for safety  Memory Memory assist level: Recognizes or recalls less than 25% of the time/requires cueing greater than 75% of the time  Medical Problem List and Plan: 1. 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 DVT Prophylaxis/Anticoagulation: Subcutaneous heparin. Monitor platelet counts and any signs of bleeding. 3. Pain Management: Hycet  4. Mood: Ativan as needed 5. Neuropsych: This patient is notcapable of making decisions on hisown behalf.  -HS seroquel for sleep/agitation  -limiting benzos  -continue ritalin for arousal, attention and initiation-  bid  -enclosure bed for safety still required-renewed  -soft helmet for safety 6. Skin/Wound Care: Routine  skin checks  -helmet for cranium protection  -flap site  Intact  -crani site with fibronecrotic/?purulent drainage----empiric keflex started on 02/23/17  -continue to paint wound bid with betadine. express drainage from pockets as able  -cbc stable yesterday  -wound stable, showing some improvement  -team continues to cue patient regarding keeping his hand out of wound 7. Fluids/Electrolytes/Nutrition:    -intake reasonable 8.Seizure prophylaxis. Keppra 750 mg twice daily 9.Pneumonia. Five-day course of Ancef  completed 01/22/2017 10.Dysphagia. On D1 thins. Taking meds without issues   11.Urinary retention/recent Escherichia coli UTI s/p course of Cipro.   -has begun to void again but still requires I/O caths at times   - repeat urine culture with 100k coag neg staph--Sens to macrodantin---will rx for 5 days  -continue urecholine---increased to    -timed voids 12.Diabetes mellitus with peripheral neuropathy. Hemoglobin A1c 10.4.  Patient on Glucophage 500 mg twice a day and Glucotrol 5 mg daily at home  -reasonable control at present  -lantus currently at 15u qhs 13.History of hypertension. Patient on lisinopril 2.5 mg daily prior to admission. Resume as needed  Under control 14.Constipation. Laxative assistance     LOS (Days) 25 A FACE TO FACE EVALUATION WAS PERFORMED  [name], MD 02/26/2017 9:13 AM

## 2017-02-27 ENCOUNTER — Inpatient Hospital Stay (HOSPITAL_COMMUNITY): Payer: Self-pay | Admitting: Occupational Therapy

## 2017-02-27 LAB — GLUCOSE, CAPILLARY
GLUCOSE-CAPILLARY: 81 mg/dL (ref 65–99)
GLUCOSE-CAPILLARY: 115 mg/dL — AB (ref 65–99)
GLUCOSE-CAPILLARY: 165 mg/dL — AB (ref 65–99)
Glucose-Capillary: 140 mg/dL — ABNORMAL HIGH (ref 65–99)

## 2017-02-27 NOTE — Progress Notes (Signed)
Occupational Therapy Session Note  Patient Details  Name: [name] MRN: [id number] Date of Birth: 26-Nov-1965  Today's Date: 02/27/2017 OT Individual Time: 0350-0938 OT Individual Time Calculation (min): 57 min    Short Term Goals: Week 3:  OT Short Term Goal 1 (Week 3): Pt will perform toileting with max A in order to decrease assistance with functional task.  OT Short Term Goal 1 - Progress (Week 3): Met OT Short Term Goal 2 (Week 3): Pt will utilize L UE in functional tasks with mod multimodal cues. OT Short Term Goal 2 - Progress (Week 3): Met OT Short Term Goal 3 (Week 3): Pt will initiate sit <>stand for LB clothing management with mod A.  OT Short Term Goal 3 - Progress (Week 3): Met  Skilled Therapeutic Interventions/Progress Updates: Skilled OT session completed with focus on ADL retraining, cognitive remediation, and functional ambulation. Pt greeted lying in enclosure bed with interpretor present. Pt verbalizing desire to drink water. Had him don underwear, pants, and sneakers prior to w/c transfer. Pt required extra time for processing/organization, and cues for terminating functional as well as nonfunctional tasks (I.e. Tying shoes after donning them, and correcting him when he tried to untie laces and unthread them completely from shoe). Pt donned helmet backwards x3 times, able to don it correctly 1x and clasp it himself. Mod A stand pivot transfer to w/c, pt with posterior lean and trying to clutch bed/call bell with L UE. Once in w/c, cued pt to take small sips with water. Pt able to correctly identify relationships with family members in room with extra time. Pt then self propelled to dayroom with Mod A, tried to engage him in standing activity involving writing with pt losing interest and sitting down x4. Pt motivated to ambulate due to mother being present via skype chat. With 2 helpers guiding pt at forearms from each side, and helper for w/c follow, pt ambulated  200 ft in semicircle. Manual cues for upright posture. Pt smiling and waving to mother on phone x2. At end of tx pt returned to enclosure bed. Pt left with family at time of departure.      Therapy Documentation Precautions:  Precautions Precautions: Fall, Other (comment) Precaution Comments: bone flap removed R side of head in abdomen; hemet for OOB; Pt no longer with NG or PICC; enclosure bed Required Braces or Orthoses: Other Brace/Splint Other Brace/Splint: helmet for OOB Restrictions Weight Bearing Restrictions: No   Vital Signs: Therapy Vitals Temp: 97.8 F (36.6 C) Temp Source: Oral Pulse Rate: 91 Resp: 20 BP: 110/75 Patient Position (if appropriate): Lying Oxygen Therapy SpO2: 100 % O2 Device: Not Delivered Pain:   ADL:  :    See Function Navigator for Current Functional Status.   Therapy/Group: Individual Therapy  Loc Feinstein A Suellyn Meenan 02/27/2017, 4:01 PM

## 2017-02-27 NOTE — Progress Notes (Signed)
[facility]ILIT[facility]     PROGRESS NOTE    Subjective/Complaints: Difficulty with following gestural cues, remains in enclosure bed for safety, high fall risk  ROS: Limited due cognitive/behavioral      Objective: Vital Signs: Blood pressure 110/72, pulse 89, temperature 98.1 F (36.7 C), temperature source Oral, resp. rate 18, height  (1.575 m), weight 58.7 kg (129 lb 6.6 oz), SpO2 100 %. No results found.  Recent Labs  02/25/17 0635  WBC 4.8  HGB 11.8*  HCT 34.7*  PLT 295    Recent Labs  02/25/17 0635  NA 140  K 4.3  CL 103  GLUCOSE 125*  BUN 10  CREATININE 0.58*  CALCIUM 9.4   CBG (last 3)   Recent Labs  02/26/17 1213 02/26/17 1642 02/26/17 2052  GLUCAP 98 111* 126*    Wt Readings from Last 3 Encounters:  02/25/17 58.7 kg (129 lb 6.6 oz)  02/01/17 65.6 kg (144 lb 9.6 oz)  07/31/16 68 kg (150 lb)    Physical Exam:    Gen: NAD. Well developed.  HENT:  Craniectomy scar  Cardiovascular:   RRR Respiratory:  CTA B GI: Soft. Bowel sounds are normal. Neurological: He is alert.  Delayed initiation. Left inattention. Doesn't engage as quickly with the left arm/leg Psychiatric:   Flat but cooperative Skin. Warm and dry. Craniectomy wound still with two areas of scab/opening with some fibronecrotic debris slightly expressible from wound.  r  Assessment/Plan: 1. Cognitive and functional deficits secondary to right traumatic ICH/SDH s/p craniectomy which require 3+ hours per day of interdisciplinary therapy in a comprehensive inpatient rehab setting. Physiatrist is providing close team supervision and 24 hour management of active medical problems listed below. Physiatrist and rehab team continue to assess barriers to discharge/monitor patient progress toward functional and medical goals.  Function:  Bathing Bathing position   Position: Shower  Bathing parts Body parts bathed by patient: Right arm, Left arm, Chest,  Abdomen, Front perineal area, Right upper leg, Left upper leg, Buttocks Body parts bathed by helper: Left lower leg, Right lower leg, Back  Bathing assist Assist Level: Touching or steadying assistance(Pt > 75%)      Upper Body Dressing/Undressing Upper body dressing   What is the patient wearing?: Pull over shirt/dress     Pull over shirt/dress - Perfomed by patient: Thread/unthread right sleeve, Thread/unthread left sleeve, Put head through opening, Pull shirt over trunk Pull over shirt/dress - Perfomed by helper: Pull shirt over trunk        Upper body assist Assist Level: Supervision or verbal cues   Set up : To obtain clothing/put away  Lower Body Dressing/Undressing Lower body dressing   What is the patient wearing?: Pants, Shoes, [employer], [address] - Performed by patient: Thread/unthread right underwear leg, Thread/unthread left underwear leg, Pull underwear up/down   Pants- Performed by patient: Thread/unthread right pants leg, Thread/unthread left pants leg, Pull pants up/down Pants- Performed by helper: Pull pants up/down Non-skid slipper socks- Performed by patient: Don/doff right sock, Don/doff left sock Non-skid slipper socks- Performed by helper: Don/doff right sock, Don/doff left sock   Socks - Performed by helper: Don/doff right sock, Don/doff left sock Shoes - Performed by patient: Don/doff right shoe, Don/doff left shoe, Fasten right, Fasten left Shoes - Performed by helper: Fasten right, Fasten left       TED Hose - Performed by helper: Don/doff right TED hose, Don/doff left TED hose  Lower body assist Assist  for lower body dressing: Touching or steadying assistance (Pt > 75%)      Toileting Toileting Toileting activity did not occur: Safety/medical concerns Toileting steps completed by patient: Adjust clothing prior to toileting, Performs perineal hygiene, Adjust clothing after toileting Toileting steps completed by helper: Adjust clothing prior  to toileting, Performs perineal hygiene, Adjust clothing after toileting Toileting Assistive Devices: Grab bar or rail  Toileting assist Assist level: Touching or steadying assistance (Pt.75%)   Transfers Chair/bed transfer   Chair/bed transfer method: Stand pivot Chair/bed transfer assist level: Moderate assist (Pt 50 - 74%/lift or lower) Chair/bed transfer assistive device: Armrests Mechanical lift: Scientist, water quality activity did not occur: Safety/medical concerns   Max distance: 100 Assist level: 2 helpers   Education administrator activity did not occur: Safety/medical concerns   Max wheelchair distance: 10 Assist Level: Maximal assistance (Pt 25 - 49%)  Cognition Comprehension Comprehension assist level: Understands basic 25 - 49% of the time/ requires cueing 50 - 75% of the time  Expression Expression assist level: Expresses basic 25 - 49% of the time/requires cueing 50 - 75% of the time. Uses single words/gestures.  Social Interaction Social Interaction assist level: Interacts appropriately 25 - 49% of time - Needs frequent redirection.  Problem Solving Problem solving assist level: Solves basic 25 - 49% of the time - needs direction more than half the time to initiate, plan or complete simple activities  Memory Memory assist level: Recognizes or recalls less than 25% of the time/requires cueing greater than 75% of the time  Medical Problem List and Plan: 1. 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 DVT Prophylaxis/Anticoagulation: Subcutaneous heparin. Monitor platelet counts and any signs of bleeding. 3. Pain Management: Hycet  4. Mood: Ativan as needed 5. Neuropsych: This patient is notcapable of making decisions on hisown behalf.  -HS seroquel for  sleep/agitation  -limiting benzos  -continue ritalin for arousal, attention and initiation-  bid  -enclosure bed for safety still required-renewed  -soft helmet for safety 6. Skin/Wound Care: Routine skin checks  -helmet for cranium protection  -flap site  Intact   -continue to paint wound bid with betadine. express drainage from pockets as able  -cbc stable yesterday  -wound stable, abd wound well healed  -team continues to cue patient regarding keeping his hand out of wound 7. Fluids/Electrolytes/Nutrition:    -intake reasonable 8.Seizure prophylaxis. Keppra 750 mg twice daily 9.Pneumonia. Five-day course of Ancef  completed 01/22/2017 10.Dysphagia. On D1 thins. Taking meds without issues   11.Urinary retention/recent Escherichia coli UTI s/p course of Cipro.   -has begun to void again but still requires I/O caths at times   - repeat urine culture with 100k coag neg staph--Sens to macrodantin--- on day #2, will rx for 5 days  -continue urecholine---increased to    -timed voids 12.Diabetes mellitus with peripheral neuropathy. Hemoglobin A1c 10.4.  Patient on Glucophage 500 mg twice a day and Glucotrol 5 mg daily at home  -reasonable control at present  -lantus currently at 15u qhs 13.History of hypertension. Patient on lisinopril 2.5 mg daily prior to admission. Resume if >130/85   Vitals:   02/26/17 1446 02/27/17 0434  BP: 108/73 110/72  Pulse: 99 89  Resp: 20 18  Temp: 98.5 F (36.9 C) 98.1 F (36.7 C)   14.Constipation. Laxative assistance  LOS (Days) 26 A FACE TO FACE EVALUATION WAS PERFORMED  Erick Colace, MD 02/27/2017 6:38 AM

## 2017-02-28 ENCOUNTER — Inpatient Hospital Stay (HOSPITAL_COMMUNITY): Payer: Self-pay | Admitting: Occupational Therapy

## 2017-02-28 LAB — GLUCOSE, CAPILLARY
GLUCOSE-CAPILLARY: 154 mg/dL — AB (ref 65–99)
Glucose-Capillary: 163 mg/dL — ABNORMAL HIGH (ref 65–99)
Glucose-Capillary: 90 mg/dL (ref 65–99)
Glucose-Capillary: 172 mg/dL — ABNORMAL HIGH (ref 65–99)

## 2017-02-28 NOTE — Progress Notes (Signed)
[facility] MEDICINE & REHABILITATION     PROGRESS NOTE    Subjective/Complaints: Per nursing, patient no longer trying to get out of bed as soon as it is unzipped. Patient cannot follow commands even in Spanish such as breathing deeply, but response to gestural cues  ROS: Limited due cognitive/behavioral      Objective: Vital Signs: Blood pressure (!) 94/59, pulse 87, temperature 97.9 F (36.6 C), temperature source Oral, resp. rate 19, height  (1.575 m), weight 58.7 kg (129 lb 6.6 oz), SpO2 100 %. No results found. No results for input(s): WBC, HGB, HCT, PLT in the last 72 hours. No results for input(s): NA, K, CL, GLUCOSE, BUN, CREATININE, CALCIUM in the last 72 hours.  Invalid input(s): CO CBG (last 3)   Recent Labs  02/27/17 1704 02/27/17 2138 02/28/17 0627  GLUCAP 115* 165* 163*    Wt Readings from Last 3 Encounters:  02/28/17 58.7 kg (129 lb 6.6 oz)  02/01/17 65.6 kg (144 lb 9.6 oz)  07/31/16 68 kg (150 lb)    Physical Exam:    Gen: NAD. Well developed.  HENT:  Craniectomy scar  Cardiovascular:   RRR Respiratory:  CTA B GI: Soft. Bowel sounds are normal. Neurological: He is alert.  Delayed initiation. Left inattention. Doesn't engage as quickly with the left arm/leg Psychiatric:   Flat but cooperative Skin. Warm and dry. Craniectomy wound still with two areas of scab/opening with some fibronecrotic debris slightly expressible from wound.  r  Assessment/Plan: 1. Cognitive and functional deficits secondary to right traumatic ICH/SDH s/p craniectomy which require 3+ hours per day of interdisciplinary therapy in a comprehensive inpatient rehab setting. Physiatrist is providing close team supervision and 24 hour management of active medical problems listed below. Physiatrist and rehab team continue to assess barriers to discharge/monitor patient progress toward functional and medical goals.  Function:  Bathing Bathing position   Position:  Shower  Bathing parts Body parts bathed by patient: Right arm, Left arm, Chest, Abdomen, Front perineal area, Right upper leg, Left upper leg, Buttocks Body parts bathed by helper: Left lower leg, Right lower leg, Back  Bathing assist Assist Level: Touching or steadying assistance(Pt > 75%)      Upper Body Dressing/Undressing Upper body dressing   What is the patient wearing?: Pull over shirt/dress     Pull over shirt/dress - Perfomed by patient: Thread/unthread right sleeve, Thread/unthread left sleeve, Put head through opening, Pull shirt over trunk Pull over shirt/dress - Perfomed by helper: Pull shirt over trunk        Upper body assist Assist Level: Supervision or verbal cues   Set up : To obtain clothing/put away  Lower Body Dressing/Undressing Lower body dressing   What is the patient wearing?: Underwear, Pants, [employer], Shoes Underwear - Performed by patient: Thread/unthread right underwear leg, Thread/unthread left underwear leg, Pull underwear up/down   Pants- Performed by patient: Thread/unthread right pants leg, Thread/unthread left pants leg, Pull pants up/down Pants- Performed by helper: Pull pants up/down Non-skid slipper socks- Performed by patient: Don/doff right sock, Don/doff left sock Non-skid slipper socks- Performed by helper: Don/doff right sock, Don/doff left sock   Socks - Performed by helper: Don/doff right sock, Don/doff left sock Shoes - Performed by patient: Don/doff right shoe, Don/doff left shoe, Fasten right, Fasten left Shoes - Performed by helper: Fasten right, Fasten left       TED Hose - Performed by helper: Don/doff right TED hose, Don/doff left TED hose  Lower body assist Assist for lower body dressing: Touching or steadying assistance (Pt > 75%)      Toileting Toileting Toileting activity did not occur: Safety/medical concerns Toileting steps completed by patient: Adjust clothing prior to toileting, Performs perineal hygiene, Adjust  clothing after toileting Toileting steps completed by helper: Adjust clothing prior to toileting, Performs perineal hygiene, Adjust clothing after toileting Toileting Assistive Devices: Grab bar or rail  Toileting assist Assist level: Touching or steadying assistance (Pt.75%)   Transfers Chair/bed transfer   Chair/bed transfer method: Stand pivot Chair/bed transfer assist level: Moderate assist (Pt 50 - 74%/lift or lower) Chair/bed transfer assistive device: Armrests Mechanical lift: Scientist, water quality activity did not occur: Safety/medical concerns   Max distance: 100 Assist level: 2 helpers   Education administrator activity did not occur: Safety/medical concerns   Max wheelchair distance: 10 Assist Level: Maximal assistance (Pt 25 - 49%)  Cognition Comprehension Comprehension assist level: Understands basic 25 - 49% of the time/ requires cueing 50 - 75% of the time  Expression Expression assist level: Expresses basic 25 - 49% of the time/requires cueing 50 - 75% of the time. Uses single words/gestures.  Social Interaction Social Interaction assist level: Interacts appropriately 25 - 49% of time - Needs frequent redirection.  Problem Solving Problem solving assist level: Solves basic 25 - 49% of the time - needs direction more than half the time to initiate, plan or complete simple activities  Memory Memory assist level: Recognizes or recalls less than 25% of the time/requires cueing greater than 75% of the time  Medical Problem List and Plan: 1. 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 DVT Prophylaxis/Anticoagulation: Subcutaneous heparin. Monitor platelet counts and any signs of bleeding. 3. Pain Management: Hycet  4. Mood: Ativan as needed 5. Neuropsych: This  patient is notcapable of making decisions on hisown behalf.  -HS seroquel for sleep/agitation  -limiting benzos  -continue ritalin for arousal, attention and initiation-  bid  -enclosure bed for safety still required-renewed  -soft helmet for safety 6. Skin/Wound Care: Routine skin checks  -helmet for cranium protection  -flap site  Intact   -continue to paint wound bid with betadine. express drainage from pockets as able  -cbc stable yesterday  -wound stable, abd wound well healed  -team continues to cue patient regarding keeping his hand out of wound 7. Fluids/Electrolytes/Nutrition:    -intake reasonable 8.Seizure prophylaxis. Keppra 750 mg twice daily 9.Pneumonia. Five-day course of Ancef  completed 01/22/2017 10.Dysphagia. On D1 thins. Taking meds without issues   11.Urinary retention/recent Escherichia coli UTI s/p course of Cipro.   -has begun to void again but still requires I/O caths at times   - repeat urine culture with 100k coag neg staph--Sens to macrodantin--- on day #3, will rx for 5 days  -continue urecholine---increased to    -timed voids 12.Diabetes mellitus with peripheral neuropathy. Hemoglobin A1c 10.4.  Patient on Glucophage 500 mg twice a day and Glucotrol 5 mg daily at home  -reasonable control at present  -lantus currently at 15u qhs 13.History of hypertension. Patient on lisinopril 2.5 mg daily prior to admission. Resume if >130/85 Blood pressure currently on the low side. May be medication related, possibly Urecholine   Vitals:   02/27/17 1428 02/28/17 0610  BP: 110/75 (!) 94/59  Pulse: 91 87  Resp:  20 19  Temp: 97.8 F (36.6 C) 97.9 F (36.6 C)   14.Constipation. Laxative assistance     LOS (Days) 27 A FACE TO FACE EVALUATION WAS PERFORMED  Erick Colace, MD 02/28/2017 10:40 AM

## 2017-02-28 NOTE — Progress Notes (Addendum)
Occupational Therapy Session Note  Patient Details  Name: [name] MRN: [id number] Date of Birth: 09/16/1966  Today's Date: 02/28/2017 OT Individual Time: 4098-1191 OT Individual Time Calculation (min): 59 min   Skilled Therapeutic Interventions/Progress Updates: Tx focus on cognitive remediation, functional transfers, standing balance.   Pt lying in enclosure bed without LB garments and interpretor present. Underwear and gripper socks donned with extra time while supine in prep for shower. Mod A stand pivot transfer to w/c with manual cues for anterior weight shifting. Min A transfer to tub bench, he initiated sock removal. He bathed with helmet donned (per RN, keep head relatively dry). Pt able to initiate washing of arms and chest/abdomen, often completed sit<stands and "freezing," requiring cues for sequencing after he was provided with extra time for self organizing.  Pt able to wash feet with therapist assist for placing LEs in figure 4 position. Pt removing helmet during shower, therapist controlled hand held shower to keep head dry. Afterwards pt dressed w/c level at sink. Pt donning underwear inside out/backwards and reporting that they were on correctly.   He assisted therapist with donning Teds and donned footwear himself. He reported desire to remain up in w/c with family present. Stand pivot<TIS with Mod A. Pt reclined and safety belt donned. He was left with family at time of departure.  Pt oriented to place (hospital) today.   Therapy Documentation Precautions:  Precautions Precautions: Fall, Other (comment) Precaution Comments: bone flap removed R side of head in abdomen; hemet for OOB; Pt no longer with NG or PICC; enclosure bed Required Braces or Orthoses: Other Brace/Splint Other Brace/Splint: helmet for OOB Restrictions Weight Bearing Restrictions: No   Vital Signs: Therapy Vitals Temp: 98.2 F (36.8 C) Temp Source: Oral Pulse Rate: 89 Resp: 18 BP:  113/73 Patient Position (if appropriate): Lying Oxygen Therapy SpO2: 100 % O2 Device: Not Delivered Pain: No c/o pan during tx    ADL:      See Function Navigator for Current Functional Status.   Therapy/Group: Individual Therapy  Stevi Hollinshead A Steven Veazie 02/28/2017, 3:51 PM

## 2017-03-01 ENCOUNTER — Inpatient Hospital Stay (HOSPITAL_COMMUNITY): Payer: Self-pay | Admitting: Occupational Therapy

## 2017-03-01 ENCOUNTER — Inpatient Hospital Stay (HOSPITAL_COMMUNITY): Payer: Self-pay | Admitting: Physical Therapy

## 2017-03-01 ENCOUNTER — Inpatient Hospital Stay (HOSPITAL_COMMUNITY): Payer: Self-pay | Admitting: Speech Pathology

## 2017-03-01 LAB — GLUCOSE, CAPILLARY
GLUCOSE-CAPILLARY: 189 mg/dL — AB (ref 65–99)
Glucose-Capillary: 141 mg/dL — ABNORMAL HIGH (ref 65–99)
Glucose-Capillary: 138 mg/dL — ABNORMAL HIGH (ref 65–99)
Glucose-Capillary: 155 mg/dL — ABNORMAL HIGH (ref 65–99)

## 2017-03-01 NOTE — Plan of Care (Signed)
Problem: RH Dressing Goal: LTG Patient will perform upper body dressing (OT) LTG Patient will perform upper body dressing with assist, with/without cues (OT).  Upgraded due to progress Goal: LTG Patient will perform lower body dressing w/assist (OT) LTG: Patient will perform lower body dressing with assist, with/without cues in positioning using equipment (OT)  Upgraded due to progress

## 2017-03-01 NOTE — Progress Notes (Signed)
Physical Therapy Session Note  Patient Details  Name: [name] MRN: [id number] Date of Birth: 12-11-1965  Today's Date: 03/01/2017 PT Individual Time: 1005-1100 and 1305-1400 PT Individual Time Calculation (min): 55 min and 55 min  Short Term Goals: Week 4:  PT Short Term Goal 1 (Week 4): Pt will consistently ambulate 30 ft with mod assist +1. PT Short Term Goal 2 (Week 4): Pt will initiate functional mobilty tasks with moderate cuing. PT Short Term Goal 3 (Week 4): Pt will negotiate 4 steps with max assist.  Skilled Therapeutic Interventions/Progress Updates:  Treatment 1: Pt received in enclosure bed without pants on; educated pt on need to wear pants during therapy session & pt able to don pants, socks, and shoes with supervision, max cuing, and significantly extra time. Pt reported need to use the bathroom & ambulated throughout room to bathroom and sink with +2 assist (moderate assist + 2nd person for safety, pt = 50%). Pt with continent BM on toilet and performed peri-hygiene with therapist ensuring cleanliness. Pt ambulated room<>gym in same manner with decreased gait speed, impaired weight shifting L<>R, and impaired step length BLE. Pt negotiated 24 steps (6"+3") with B rails and rest break in between task. Pt required +2 assist (mod assist + 2nd person for safety) and required occasional physical assistance for appropriate foot placement on step. Pt engaged in ball toss while standing with mod assist for balance; pt frequently attempts to reach for objects for support, as he also does when ambulating. When catching and tossing ball pt demonstrates improved coordination of BUE. At end of session pt left in w/c in room with QRB donned & in handoff to OT.  Treatment 2: Pt received in bed without pants on, requiring cuing to don pants then socks & shoes. Pt required max cuing and more than reasonable amount of time to complete task. Pt required assistance to thread pants on LLE  and cuing to don 2nd sock on alternate foot as pt attempted to put second sock on foot with one already donned. Pt transferred bed>w/c via stand pivot with min assist with pt requiring multiple attempts to initiate transfer and assistance with weight shifting & cuing for technique as pt did not initiate pivot portion of transfer. Majority of session focused on consuming lunch meal in day room with pt requiring only occasional cuing to slow rate of consumption. 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 Pt with improving ability to attend to L side of tray with less cuing. Pt's sister [name]) present for session and inquiring about treatment, pt's progress, and d/c plans. [name] reports she is very concerned about pt's ability to ambulate with very little to no assistance and reports she cannot provide 24 hr supervision unless pt is at this level. Educated [name] on pt's progress and current level of mobility as well as current PT goals; also educated LCSW on Adraina's concerns. Pt unable to recall this therapist's name but demonstrates improved awareness when he utilized name tag to identify therapist (without cuing to do so). Pt then engaged in card matching game requiring max fade to moderate cuing to make correct matches. Pt encouraged to use LUE during task but continues to prefer to use RUE. At end of session pt left sitting in w/c with QRB donned & in handoff to SLP.  Therapy Documentation Precautions:  Precautions Precautions: Fall, Other (comment) Precaution Comments: bone flap removed R side  of head in abdomen; hemet for OOB; Pt no longer with NG or PICC; enclosure bed Required Braces or Orthoses: Other Brace/Splint Other Brace/Splint: helmet for OOB Restrictions Weight Bearing Restrictions: No  Pain: No c/o or behaviors noting pain.  See Function Navigator for Current Functional Status.   Therapy/Group:  Individual Therapy  Sandi Mariscal 03/01/2017, 2:33 PM

## 2017-03-01 NOTE — Progress Notes (Signed)
Speech Language Pathology Daily Session Note  Patient Details  Name: Dayle Mcnerney MRN: [id number] Date of Birth: 1966-10-08  Today's Date: 03/01/2017 SLP Individual Time: 1400-1430 SLP Individual Time Calculation (min): 30 min  Short Term Goals: Week 4: SLP Short Term Goal 1 (Week 4): Patient will consume current diet with minimal overt s/s of aspiration with Min A verbal cues for use of swallowing compensatory strategies.  SLP Short Term Goal 2 (Week 4): Patient will demonstrate sustained attention to a functional task for ~2 minutes with Min A verbal cues for redirection.  SLP Short Term Goal 3 (Week 4): Patient will orient to place, situation and time with Max A multimodal cues.  SLP Short Term Goal 4 (Week 4): Patient will demonstrate basic problem solving for basic tasks with Max A verbal cues.  SLP Short Term Goal 5 (Week 4): Patient will demonstrate efficient mastication with mild oral residue and minimal overt s/s of aspiration with Mod A verbal cues over 2 sessions prior to upgrade.   Skilled Therapeutic Interventions: Skilled treatment session focused on cognitive goals. Patient was oriented to time, place and situation with supervision question cues at beginning of session. Patient identified familiar family members in person pictures with Min A verbal cues and demonstrated increased verbal expression and social interaction with clinician. Patient demonstrated selective attention to pictures in a mildly distracting environment for ~15 minutes with Mod I. Patient left upright at table with sister present. Continue with current plan of care.      Function:  Cognition Comprehension Comprehension assist level: Understands basic 75 - 89% of the time/ requires cueing 10 - 24% of the time  Expression   Expression assist level: Expresses basic 50 - 74% of the time/requires cueing 25 - 49% of the time. Needs to repeat parts of sentences.  Social Interaction Social Interaction  assist level: Interacts appropriately 50 - 74% of the time - May be physically or verbally inappropriate.  Problem Solving Problem solving assist level: Solves basic 25 - 49% of the time - needs direction more than half the time to initiate, plan or complete simple activities  Memory Memory assist level: Recognizes or recalls 25 - 49% of the time/requires cueing 50 - 75% of the time    Pain No/Denies Pain   Therapy/Group: Individual Therapy  [place] Jeff 03/01/2017, 3:18 PM

## 2017-03-01 NOTE — Progress Notes (Signed)
[facility]YSICAL MEDICINE & REHABILITATION     PROGRESS NOTE    Subjective/Complaints: No new issues.   ROS: pt denies nausea, vomiting, diarrhea, cough, shortness of breath or chest pain       Objective: Vital Signs: Blood pressure 117/61, pulse 85, temperature 98.3 F (36.8 C), temperature source Oral, resp. rate 18, height  (1.575 m), weight 58.5 kg (128 lb 15.5 oz), SpO2 100 %. No results found. No results for input(s): WBC, HGB, HCT, PLT in the last 72 hours. No results for input(s): NA, K, CL, GLUCOSE, BUN, CREATININE, CALCIUM in the last 72 hours.  Invalid input(s): CO CBG (last 3)   Recent Labs  02/28/17 1650 02/28/17 2106 03/01/17 0611  GLUCAP 154* 172* 141*    Wt Readings from Last 3 Encounters:  03/01/17 58.5 kg (128 lb 15.5 oz)  02/01/17 65.6 kg (144 lb 9.6 oz)  07/31/16 68 kg (150 lb)    Physical Exam:    Gen: NAD. Well developed.  HENT:  Craniectomy scar  Cardiovascular:   RRR Respiratory:  CTA B GI: Soft. Bowel sounds are normal. Neurological: He is alert.  Delayed initiation. Left inattention. Doesn't engage as quickly with the left arm/leg Psychiatric:   Flat but cooperative Skin. Warm and dry. Crani wound with decreased drainage  Assessment/Plan: 1. Cognitive and functional deficits secondary to right traumatic ICH/SDH s/p craniectomy which require 3+ hours per day of interdisciplinary therapy in a comprehensive inpatient rehab setting. Physiatrist is providing close team supervision and 24 hour management of active medical problems listed below. Physiatrist and rehab team continue to assess barriers to discharge/monitor patient progress toward functional and medical goals.  Function:  Bathing Bathing position   Position: Shower  Bathing parts Body parts bathed by patient: Right arm, Left arm, Chest, Abdomen, Front perineal area, Right upper leg, Left upper leg, Buttocks Body parts bathed by helper: Right lower leg, Left lower  leg, Back  Bathing assist Assist Level: Touching or steadying assistance(Pt > 75%)      Upper Body Dressing/Undressing Upper body dressing   What is the patient wearing?: Pull over shirt/dress     Pull over shirt/dress - Perfomed by patient: Thread/unthread right sleeve, Thread/unthread left sleeve, Put head through opening, Pull shirt over trunk Pull over shirt/dress - Perfomed by helper: Pull shirt over trunk        Upper body assist Assist Level: Supervision or verbal cues   Set up : To obtain clothing/put away  Lower Body Dressing/Undressing Lower body dressing   What is the patient wearing?: Underwear, Pants, [employer], Shoes, Non-skid slipper socks Underwear - Performed by patient: Thread/unthread right underwear leg, Thread/unthread left underwear leg, Pull underwear up/down   Pants- Performed by patient: Thread/unthread right pants leg, Thread/unthread left pants leg, Pull pants up/down Pants- Performed by helper: Pull pants up/down Non-skid slipper socks- Performed by patient: Don/doff left sock, Don/doff right sock Non-skid slipper socks- Performed by helper: Don/doff right sock, Don/doff left sock   Socks - Performed by helper: Don/doff right sock, Don/doff left sock Shoes - Performed by patient: Don/doff right shoe, Don/doff left shoe, Fasten right, Fasten left Shoes - Performed by helper: Fasten right, Fasten left       TED Hose - Performed by helper: Don/doff right TED hose, Don/doff left TED hose  Lower body assist Assist for lower body dressing: Touching or steadying assistance (Pt > 75%)      Toileting Toileting Toileting activity did not occur: Safety/medical concerns Toileting  steps completed by patient: Adjust clothing prior to toileting, Performs perineal hygiene, Adjust clothing after toileting Toileting steps completed by helper: Adjust clothing prior to toileting, Performs perineal hygiene, Adjust clothing after toileting Toileting Assistive Devices:  Grab bar or rail  Toileting assist Assist level: Touching or steadying assistance (Pt.75%)   Transfers Chair/bed transfer   Chair/bed transfer method: Stand pivot Chair/bed transfer assist level: Moderate assist (Pt 50 - 74%/lift or lower) Chair/bed transfer assistive device: Armrests Mechanical lift: Scientist, water quality activity did not occur: Safety/medical concerns   Max distance: 100 Assist level: 2 helpers   Education administrator activity did not occur: Safety/medical concerns   Max wheelchair distance: 10 Assist Level: Maximal assistance (Pt 25 - 49%)  Cognition Comprehension Comprehension assist level: Understands basic 25 - 49% of the time/ requires cueing 50 - 75% of the time  Expression Expression assist level: Expresses basic 25 - 49% of the time/requires cueing 50 - 75% of the time. Uses single words/gestures.  Social Interaction Social Interaction assist level: Interacts appropriately 25 - 49% of time - Needs frequent redirection.  Problem Solving Problem solving assist level: Solves basic 25 - 49% of the time - needs direction more than half the time to initiate, plan or complete simple activities  Memory Memory assist level: Recognizes or recalls 25 - 49% of the time/requires cueing 50 - 75% of the time  Medical Problem List and Plan: 1. 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 DVT Prophylaxis/Anticoagulation: Subcutaneous heparin. Monitor platelet counts and any signs of bleeding. 3. Pain Management: Hycet  4. Mood: Ativan as needed 5. Neuropsych: This patient is notcapable of making decisions on hisown behalf.  -HS seroquel for sleep/agitation  -limiting benzos  -continue ritalin for arousal, attention and initiation-  bid  -enclosure bed for  safety still required-renewed  -soft helmet for safety 6. Skin/Wound Care: Routine skin checks  -helmet for cranium protection  -flap site  Intact   -continue to paint wound bid with betadine. express drainage from pockets as able  -cbc stable yesterday  -wound stable, abd wound well healed  -team continues to cue patient regarding keeping his hand out of wound 7. Fluids/Electrolytes/Nutrition:    -intake reasonable 8.Seizure prophylaxis. Keppra 750 mg twice daily 9.Pneumonia. Five-day course of Ancef  completed 01/22/2017 10.Dysphagia. On D1 thins. Taking meds without issues   11.Urinary retention/recent Escherichia coli UTI s/p course of Cipro.   - requires I/O caths at times   - repeat urine culture with 100k coag neg staph--Sens to macrodantin--- for 5 days  -continue urecholine---increased to    -timed voids 12.Diabetes mellitus with peripheral neuropathy. Hemoglobin A1c 10.4.  Patient on Glucophage 500 mg twice a day and Glucotrol 5 mg daily at home  -reasonable control at present  -lantus currently at 15u qhs 13.History of hypertension. Patient on lisinopril 2.5 mg daily prior to admission. Resume if >130/85 Blood pressure currently on the low side. May be medication related, possibly Urecholine   Vitals:   02/28/17 1428 03/01/17 0547  BP: 113/73 117/61  Pulse: 89 85  Resp: 18 18  Temp: 98.2 F (36.8 C) 98.3 F (36.8 C)   14.Constipation. Laxative assistance     LOS (Days) 28 A FACE TO FACE EVALUATION WAS PERFORMED  [name], MD 03/01/2017 8:59 AM

## 2017-03-01 NOTE — Progress Notes (Signed)
Occupational Therapy Session Note  Patient Details  Name: [name] MRN: [id number] Date of Birth: 1966/02/11  Today's Date: 03/01/2017 OT Individual Time: 3086-5784 and 1100-1201 OT Individual Time Calculation (min): 59 min and 61 min  Skilled Therapeutic Interventions/Progress Updates:  Tx focus on cognitive skills, balance, and functional transfers during BADL completion.   Pt greeted lying in enclosure bed with interpretor present. Pt reporting yes when asked if he needed to void. Pt max needing instructional cues for donning left gripper sock right-side out and orienting pants correctly. Pt donned helmet with extra time and tactile cues. Pt ambulated with Max A HHA with close w/c follow into bathroom. Pt about to transfer to toilet and then ambulating in direction of shower, reporting yes when asked if he wanted to shower and reporting he did not have to void. Pt bathing with overall Min A, tactile cues for thorough washing of LEs. Pt able to carryover visual instruction on elevating R LE onto tub bench to wash foot safely. Pt agreeable to keeping helmet on during entire shower. Afterwards he completed stand pivot out of shower with Min A. Pt requiring mod instructional cues for selecting necessary ADL items when seated at drawers. Tactile cues for terminating footwear tasks, including tying shoes. Pt assisted with Teds.  Sit<stand at sink with Min A, with pt initiating oral care. Cues for turning sink off. At end of session pt returned to secure enclosure bed and left with all needs.    2nd Session 1:1 tx (61 min) Tx focus on cognitive skills, functional ambulation, and bilateral UE coordination.   Pt greeted in w/c after PT handoff. Pt unable to recall activities completed in morning therapy interventions. Pt ambulated 20 ft to [employer] gym with Max A HHA and w/c follow. Pt propelled the remainder of way due to fatigue. Max tactile cues for using both hands, avoiding barriers on left,  and maneuvering in correct direction. 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 Pt able to identify/distinguish tools with 100% accuracy. Max cues for maintaining attention more than 10 seconds when instructed to retrieve all of 1 specific tool. Pt engaged in simple counting of tools with mod cues for accuracy. At this time pt reported being "bored" and wanting to go "over there" though unable to specify where he wanted to go. Continued tx with simple money counting including pennies, nickels, and dimes. Pts performance improved when cued to count aloud. Pt then self propelled back to room in manner as written above. He was left in secure enclosure bed at time of departure.       Therapy Documentation Precautions:  Precautions Precautions: Fall, Other (comment) Precaution Comments: bone flap removed R side of head in abdomen; hemet for OOB; Pt no longer with NG or PICC; enclosure bed Required Braces or Orthoses: Other Brace/Splint Other Brace/Splint: helmet for OOB Restrictions Weight Bearing Restrictions: No   Pain: No c/o pain during tx    ADL:      See Function Navigator for Current Functional Status.   Therapy/Group: Individual Therapy  Ailis Rigaud A Raevon Broom 03/01/2017, 12:20 PM

## 2017-03-01 NOTE — Progress Notes (Signed)
Physical Therapy Weekly Progress Note  Patient Details  Name: [name] MRN: [id number] Date of Birth: 04/17/1966   Beginning of progress report period: February 23, 2017 End of progress report period: Mar 02, 2017  Today's Date: 03/02/2017  Patient has met 1 of 3 short term goals.  Pt is making slow progress towards long term goals. Pt is now able to ambulate with +2 assist but pt =50% as pt continues to be limited by impaired weight shifting and coordination BLE. Pt can now participate in standing balance tasks with improving attention to task, however pt's awareness is still significantly impaired as he is only oriented to himself. Pt would benefit from continued skilled PT treatment to focus on BUE/BLE coordination, gait training, stair negotiation, cognitive remediation and balance. Pt's caregiver would also benefit from hands on training prior to pt's d/c to ensure safety for pt & caregiver with functional mobility.  Patient continues to demonstrate the following deficits muscle weakness, decreased cardiorespiratoy endurance, motor apraxia and decreased coordination, decreased visual perceptual skills, decreased attention to left, decreased initiation, decreased attention, decreased awareness, decreased problem solving, decreased safety awareness, decreased memory and delayed processing, and decreased standing balance, decreased postural control and decreased balance strategies and therefore will continue to benefit from skilled PT intervention to increase functional independence with mobility.  Patient is slowly progressing toward long term goals..  Continue plan of care.  PT Short Term Goals Week 4:  PT Short Term Goal 1 (Week 4): Pt will consistently ambulate 30 ft with mod assist +1. PT Short Term Goal 1 - Progress (Week 4): Progressing toward goal (able to ambulate with mod assist (Pt = 50%) but requires frequently requires +2 for safety) PT Short Term Goal 2 (Week 4): Pt will  initiate functional mobilty tasks with moderate cuing. PT Short Term Goal 2 - Progress (Week 4): Met PT Short Term Goal 3 (Week 4): Pt will negotiate 4 steps with max assist. (requires +2 for safety) PT Short Term Goal 3 - Progress (Week 4): Progressing toward goal Week 5:  PT Short Term Goal 1 (Week 5): Pt will negotiate 4 steps with mod assist +1. PT Short Term Goal 2 (Week 5): Pt will consistently ambulate 100 ft with mod assist +1.   Therapy Documentation Precautions:  Precautions Precautions: Fall, Other (comment) Precaution Comments: bone flap removed R side of head in abdomen; hemet for OOB; Pt no longer with NG or PICC; enclosure bed Required Braces or Orthoses: Other Brace/Splint Other Brace/Splint: helmet for OOB Restrictions Weight Bearing Restrictions: No   See Function Navigator for Current Functional Status.   M  03/02/2017, 7:55 AM  

## 2017-03-02 ENCOUNTER — Inpatient Hospital Stay (HOSPITAL_COMMUNITY): Payer: Self-pay | Admitting: Occupational Therapy

## 2017-03-02 ENCOUNTER — Inpatient Hospital Stay (HOSPITAL_COMMUNITY): Payer: Self-pay | Admitting: Speech Pathology

## 2017-03-02 ENCOUNTER — Inpatient Hospital Stay (HOSPITAL_COMMUNITY): Payer: Self-pay | Admitting: *Deleted

## 2017-03-02 ENCOUNTER — Inpatient Hospital Stay (HOSPITAL_COMMUNITY): Payer: Self-pay | Admitting: Physical Therapy

## 2017-03-02 DIAGNOSIS — S069XAA Unspecified intracranial injury with loss of consciousness status unknown, initial encounter: Secondary | ICD-10-CM

## 2017-03-02 DIAGNOSIS — S069X9A Unspecified intracranial injury with loss of consciousness of unspecified duration, initial encounter: Secondary | ICD-10-CM

## 2017-03-02 HISTORY — DX: Unspecified intracranial injury with loss of consciousness of unspecified duration, initial encounter: S06.9X9A

## 2017-03-02 HISTORY — DX: Unspecified intracranial injury with loss of consciousness status unknown, initial encounter: S06.9XAA

## 2017-03-02 LAB — GLUCOSE, CAPILLARY
GLUCOSE-CAPILLARY: 123 mg/dL — AB (ref 65–99)
GLUCOSE-CAPILLARY: 114 mg/dL — AB (ref 65–99)
Glucose-Capillary: 98 mg/dL (ref 65–99)
Glucose-Capillary: 168 mg/dL — ABNORMAL HIGH (ref 65–99)

## 2017-03-02 NOTE — Progress Notes (Signed)
Physical Therapy Session Note  Patient Details  Name: [name] MRN: [id number] Date of Birth: 05-10-1966  Today's Date: 03/02/2017 PT Individual Time: 1005-1100 and 1400-1429 PT Individual Time Calculation (min): 55 min and 29 min  Short Term Goals: Week 5:  PT Short Term Goal 1 (Week 5): Pt will negotiate 4 steps with mod assist +1. PT Short Term Goal 2 (Week 5): Pt will consistently ambulate 100 ft with mod assist +1.  Skilled Therapeutic Interventions/Progress Updates:  Treatment 1: Pt received in bed & agreeable to tx. Pt able to transfer supine>sitting EOB with supervision, cuing for initiation, and more than reasonable amount of time. Pt required min assist and max cuing to don socks and shoes BLE. Pt continues to demonstrate apraxia LUE with task. Pt ambulated within room to sink to perform hand hygiene with moderate assistance for standing balance and max cuing to wash LUE as well. Pt is limited throughout all activities by his BOS, as he frequently attempts to move feet or to stand without feet in an even, stable position. Pt ambulated room<>gym with up to +2 assist (pt = 50%); therapist provided manual facilitation for weight shifting R and assistance for balance. Pt continues to reach for various objects in his path for UE support; pt demonstrates decreased step length LLE as compared to RLE. While in gym pt engaged in various activities (kicking soccer ball, tossing ball with BUE then LUE, and reaching for and matching cards) with great focus on attention to L, functional use of BUE, standing balance, standing endurance and weight shifting L<>R. Pt with improving ability to shift weight to RLE as standing task progressed. Pt with improving coordination BUE when catching ball, as compared to previous dates. Pt continues to require max cuing for correctness and error correction with card matching game; pt did not recall engaging in task yesterday. Pt continues to be oriented to  himself only (but did recall he is in a "hospital" on one occasion), as he reports it's "November 1918", we are in [facility]", and he does not recall for reason for hospitalization. Throughout session therapist continued to orient pt. At end of session pt left in handoff to OT.  Throughout session pt requires frequent rest breaks 2/2 mental & physical fatigue.  Co-tx with recreational therapist for majority of session.   Treatment 2: Pt received in handoff from SLP. Pt ambulated room>chairs by elevator with mod progressing to max assist with pt experiencing 1 LOB and requiring +2 assist to prevent LOB. Pt completed squat pivot transfers w/c<>nu-step and w/c>bed with mod assist with max multimodal cuing for technique, sequencing, and cuing. Pt utilized nu-step on level 1 x 10 minutes with all four extremities with task focusing on coordination of reciprocal movements, attention to task, endurance training, and strengthening. Pt able to attend to task for 8 minutes with supervision before requiring cuing for attention to task, as pt began asking "can I be finished now?".  At end of session pt returned to enclosure bed and left with all needs within reach & sister present in room.   Therapy Documentation Precautions:  Precautions Precautions: Fall, Other (comment) Precaution Comments: bone flap removed R side of head in abdomen; hemet for OOB; Pt no longer with NG or PICC; enclosure bed Required Braces or Orthoses: Other Brace/Splint Other Brace/Splint: helmet for OOB Restrictions Weight Bearing Restrictions: No   Pain: No behaviors or c/o pain.    See Function Navigator for Current Functional Status.   Therapy/Group:  Individual Therapy  Sandi Mariscal 03/02/2017, 2:41 PM

## 2017-03-02 NOTE — Progress Notes (Signed)
Speech Language Pathology Weekly Progress and Session Note  Patient Details  Name: [name] MRN: [id number] Date of Birth: 05/04/1966  Beginning of progress report period: February 23, 2017 End of progress report period: Mar 02, 2017  Today's Date: 03/02/2017 SLP Individual Time: 1300-1400 SLP Individual Time Calculation (min): 60 min  Short Term Goals: Week 4: SLP Short Term Goal 1 (Week 4): Patient will consume current diet with minimal overt s/s of aspiration with Min A verbal cues for use of swallowing compensatory strategies.  SLP Short Term Goal 1 - Progress (Week 4): Met SLP Short Term Goal 2 (Week 4): Patient will demonstrate sustained attention to a functional task for ~2 minutes with Min A verbal cues for redirection.  SLP Short Term Goal 2 - Progress (Week 4): Met SLP Short Term Goal 3 (Week 4): Patient will orient to place, situation and time with Max A multimodal cues.  SLP Short Term Goal 3 - Progress (Week 4): Met SLP Short Term Goal 4 (Week 4): Patient will demonstrate basic problem solving for basic tasks with Max A verbal cues.  SLP Short Term Goal 4 - Progress (Week 4): Not met SLP Short Term Goal 5 (Week 4): Patient will demonstrate efficient mastication with mild oral residue and minimal overt s/s of aspiration with Mod A verbal cues over 2 sessions prior to upgrade.  SLP Short Term Goal 5 - Progress (Week 4): Not met    New Short Term Goals: Week 5: SLP Short Term Goal 1 (Week 5): Patient will consume current diet with minimal overt s/s of aspiration with supervision verbal cues for use of swallowing compensatory strategies.  SLP Short Term Goal 2 (Week 5): Patient will demonstrate efficient mastication with mild oral residue and minimal overt s/s of aspiration with Mod A verbal cues over 2 sessions prior to upgrade.  SLP Short Term Goal 3 (Week 5): Patient will demonstrate sustained attention to a functional task for ~10 minutes with Mod A verbal cues for  redirection.  SLP Short Term Goal 4 (Week 5): Patient will orient to place, situation and time with Mod  A multimodal cues.  SLP Short Term Goal 5 (Week 5): Patient will demonstrate basic problem solving for basic tasks with Max A verbal cues.   Weekly Progress Updates: Patient has made small and inconsistent gains and has met 3 of 5 STG's this reporting period. Currently, patient demonstrates behaviors consistent with a Rancho Level V and requires overall Max-Total A to complete functional and familiar tasks safely as well as orientation, problem solving, sustained attention and awareness. Patient demonstrates increased verbal expression and social engagement without language of confusion or confabulation. Patient continues to consume Dys. 1 textures with thin liquids with minimal overt s/s of aspiration and requires Min A verbal cues for use of swallowing compensatory strategies. Patient is consuming trials of Dys. 2 textures but continues to demonstrate decreased mastication with oral residue. Recommend continued trials with SLP only. Patient and family education is ongoing. Patient would benefit from continued skilled SLP intervention to maximize his cognitive and swallowing function and overall functional independence prior to discharge.      Intensity: Minumum of 1-2 x/day, 30 to 90 minutes Frequency: 3 to 5 out of 7 days Duration/Length of Stay: 03/12/17 Treatment/Interventions: English as a second language teacher;Dysphagia/aspiration precaution training;Internal/external aids;Environmental controls;Therapeutic Activities;Patient/family education;Functional tasks;Cognitive remediation/compensation   Daily Session  Skilled Therapeutic Interventions: Skilled treatment session focused on dysphagia and cognitive goals. Upon arrival, patient was supine in enclosure bed and required  Max verbal cues for problem solving with donning socks and extra time to sit EOB and complete transfer to wheelchair. SLP facilitated  session with trials of Dys. 2 textures. Patient demonstrated intermittent oral holding with prolonged mastication and utilization of liquid washes that led to intermittent swallowing of bolus without mastication. Recommend patient continue current diet with trials with SLP only. Patient also participated in a basic calendar making task and required total A for problem solving. Patient handed off to PT. Continue with current plan of care.       Function:   Eating Eating   Modified Consistency Diet: Yes Eating Assist Level: More than reasonable amount of time;Supervision or verbal cues   Eating Set Up Assist For: Opening containers       Cognition Comprehension Comprehension assist level: Understands basic 50 - 74% of the time/ requires cueing 25 - 49% of the time  Expression   Expression assist level: Expresses basic 50 - 74% of the time/requires cueing 25 - 49% of the time. Needs to repeat parts of sentences.  Social Interaction Social Interaction assist level: Interacts appropriately 50 - 74% of the time - May be physically or verbally inappropriate.  Problem Solving Problem solving assist level: Solves basic 25 - 49% of the time - needs direction more than half the time to initiate, plan or complete simple activities  Memory Memory assist level: Recognizes or recalls less than 25% of the time/requires cueing greater than 75% of the time   Pain Pain Assessment Pain Assessment: No/denies pain  Therapy/Group: Individual Therapy  [name] 03/02/2017, 2:38 PM

## 2017-03-02 NOTE — Progress Notes (Signed)
Occupational Therapy Session Note  Patient Details  Name: [name] MRN: [id number] Date of Birth: 22-Aug-1966  Today's Date: 03/02/2017 OT Individual Time: 4098-1191 OT Individual Time Calculation (min): 59 min    Short Term Goals: Week 4:  OT Short Term Goal 1 (Week 4): Pt will transfer to toilet with min A with family member OT Short Term Goal 2 (Week 4): Pt will demonstrate intellecual awarenss with mod A (of at least two things) OT Short Term Goal 3 (Week 4): Pt will transfer into tub shower without grab bars with LRAD with min A  OT Short Term Goal 4 (Week 4): Pt will be oriented x3 with external aids with mod A  Skilled Therapeutic Interventions/Progress Updates:    Pt completed shower and dressing for ADL session.  Mod questioning cueing to determine clothing needed after shower from wheelchair level.  Min assist for transfers stand pivot from bed to wheelchair and to tub bench stand pivot during session.  Mod assist for mobility from bathroom to the wheelchair at the sink when working on grooming and dressing tasks.  Max demonstrational cueing to sequence all bathing on the shower seat including being able to apply soap to washcloth with spontaneous use of the LUE noted during bathing tasks.  Pt would rush through bathing with decreased thoroughness noted with washing as well as not attempting to wash peri area, buttocks, or feet until cued by therapist.  Pt washed hair as well with soft helmet removed for washing and drying only.  Pt frequently throughout session would take helmet off and then it would be re-applied by therapist.  Completed dressing sit to stand at the sink from wheelchair position.  Pt able to correctly state items needed to donn next when asked, and then therapist would present them to him.  Min assist for balance and sit to stand when pulling items over hips.  He was able to stand for brushing teeth as well but demonstrated motor planning difficulty when  attempting to initially place the cap on the toothpaste with the left hand.  He eventually completed placing the cap back once oral hygiene was completed and therapist questioned him "Where does the cap go?".  He was able to assist with donning TEDs, mod assist on the left and min assist on the right.  He donned shoes with mod instructional cueing for correct foot placement in the appropriate shoe.  He was able to tie them with min assist as well.  Finished session with ambulation to the therapy gym and back to the room with mod assist and no assistive device.  Pt with increased pushing forward and to the left with mobility.  Max instructional cueing to path find his way back to the room from the gym.  Interpreter present throughout session and pt left in vail bed at end of session with NT present to assist for breakfast.  Pt oriented to place this session but not time or situation.     Session 2 (11:02-12:00)  Pt in wheelchair at beginning of session.  Unable to recall earlier session of bathing and dressing with this therapist.  Ambulated to the therapy gym with mod hand held assist.  Pt with wide gait pattern with decreased control, forward and left lean.  Needs mod demonstrational cueing to slow down.  Once in the gym worked in sitting on the therapy mat with integration of the LUE to pick up large plastic pegs and place in peg board.  Pt needing  RUE restrained in order to initiate reaching and picking up the peg with the left hand.  Pt initially able to correctly pick up the appropriate color stated by therapist but demonstrated decreased ability to orient the peg to place in the hole.  He instead would attempt to place it in upside down, requiring max demonstrational cueing to re-position.  He was able to then place 2-3 pegs correctly but would eventually have trouble with orientation again.  Attempted to also have pt replicate simple peg design using picture as well as therapist also demonstrating it first,  with him using the RUE.  He was able to place the correct 2 colors on the grid but without any orientation to match the pattern.  He was able to attend to task for 10-15 mins with mod demonstrational cueing as he would continue to state that he was tired.  Transitioned to mobility to and from the apartment with max assist due to fatigue and decreased initiation of turning left to start mobility.  Had pt complete two, 3 minute intervals using the Nustep with resistance set on level 4 and average number of steps at 35-41.  Pt needed mod demonstrational cueing to keep left hand on the hand grip and continue with exercise.  Lastly had pt transition back to the therapy mat where he identified pictures of food items on bean bags.  Initially when given 7 bags and asked to find a specific item he could not do correctly.  When reduced to 3 bags he was correct 100% of the time.  When that number was increased back up to 7 bags he was correct on 3 straight attempts when therapist told him to find and item.  He was also able to correctly state item when therapist pointed and asked him as well.  Ambulated back to room with max assist and severe drift to the left and forward lean to complete session.  Pt left in bed at end of session with all sides zipped up and call button in bed.  Noted pt still attempting to take helmet off frequently throughout session, requiring max assist to re-direct.    Therapy Documentation Precautions:  Precautions Precautions: Fall, Other (comment) Precaution Comments: bone flap removed R side of head in abdomen; hemet for OOB; Pt no longer with NG or PICC; enclosure bed Required Braces or Orthoses: Other Brace/Splint Other Brace/Splint: helmet for OOB Restrictions Weight Bearing Restrictions: No  Pain: Pain Assessment Pain Assessment: No/denies pain ADL:  See Function Navigator for Current Functional Status.   Therapy/Group: Individual Therapy  Dinh Ayotte OTR/L 03/02/2017,  12:10 PM

## 2017-03-02 NOTE — Progress Notes (Signed)
[facility]     PROGRESS NOTE    Subjective/Complaints: In bed without complaints. Denies pain.   ROS: Limited due cognitive/behavioral      Objective: Vital Signs: Blood pressure 108/63, pulse 86, temperature 98.1 F (36.7 C), temperature source Oral, resp. rate 18, height  (1.575 m), weight 58.5 kg (128 lb 15.5 oz), SpO2 99 %. No results found. No results for input(s): WBC, HGB, HCT, PLT in the last 72 hours. No results for input(s): NA, K, CL, GLUCOSE, BUN, CREATININE, CALCIUM in the last 72 hours.  Invalid input(s): CO CBG (last 3)   Recent Labs  03/01/17 1632 03/01/17 2131 03/02/17 0634  GLUCAP 155* 189* 123*    Wt Readings from Last 3 Encounters:  03/01/17 58.5 kg (128 lb 15.5 oz)  02/01/17 65.6 kg (144 lb 9.6 oz)  07/31/16 68 kg (150 lb)    Physical Exam:    Gen: NAD. Well developed.  HENT:  Craniectomy scar  Cardiovascular:   RRR Respiratory:  CTA B GI: Soft. Bowel sounds are normal. Neurological:   Left inattention. Engages left side with cueing. More alert over all.  Psychiatric:   Flat but cooperative, pleasant Skin. Warm and dry. Crani wound closing nicely. No drainage. Areas of scab which are decreasing.   Assessment/Plan: 1. Cognitive and functional deficits secondary to right traumatic ICH/SDH s/p craniectomy which require 3+ hours per day of interdisciplinary therapy in a comprehensive inpatient rehab setting. Physiatrist is providing close team supervision and 24 hour management of active medical problems listed below. Physiatrist and rehab team continue to assess barriers to discharge/monitor patient progress toward functional and medical goals.  Function:  Bathing Bathing position   Position: Shower  Bathing parts Body parts bathed by patient: Right arm, Left arm, Chest, Abdomen, Front perineal area, Right upper leg, Left upper leg, Buttocks, Right lower leg, Left lower leg Body parts bathed by  helper: Back  Bathing assist Assist Level: Touching or steadying assistance(Pt > 75%)      Upper Body Dressing/Undressing Upper body dressing   What is the patient wearing?: Pull over shirt/dress     Pull over shirt/dress - Perfomed by patient: Thread/unthread right sleeve, Thread/unthread left sleeve, Put head through opening, Pull shirt over trunk Pull over shirt/dress - Perfomed by helper: Pull shirt over trunk        Upper body assist Assist Level: Supervision or verbal cues   Set up : To obtain clothing/put away  Lower Body Dressing/Undressing Lower body dressing   What is the patient wearing?: Underwear, Pants, [employer], Shoes, Non-skid slipper socks Underwear - Performed by patient: Thread/unthread right underwear leg, Thread/unthread left underwear leg, Pull underwear up/down   Pants- Performed by patient: Thread/unthread right pants leg, Thread/unthread left pants leg, Pull pants up/down Pants- Performed by helper: Pull pants up/down Non-skid slipper socks- Performed by patient: Don/doff left sock, Don/doff right sock Non-skid slipper socks- Performed by helper: Don/doff right sock, Don/doff left sock   Socks - Performed by helper: Don/doff right sock, Don/doff left sock Shoes - Performed by patient: Don/doff right shoe, Don/doff left shoe, Fasten right, Fasten left Shoes - Performed by helper: Fasten right, Fasten left       TED Hose - Performed by helper: Don/doff right TED hose, Don/doff left TED hose  Lower body assist Assist for lower body dressing: Touching or steadying assistance (Pt > 75%)      Toileting Toileting Toileting activity did not occur: Safety/medical concerns Toileting  steps completed by patient: Performs perineal hygiene Toileting steps completed by helper: Adjust clothing prior to toileting, Adjust clothing after toileting Toileting Assistive Devices: Grab bar or rail  Toileting assist Assist level: Touching or steadying assistance (Pt.75%)    Transfers Chair/bed transfer   Chair/bed transfer method: Stand pivot Chair/bed transfer assist level: Touching or steadying assistance (Pt > 75%) Chair/bed transfer assistive device: Armrests Mechanical lift: Scientist, water quality activity did not occur: Safety/medical concerns   Max distance: 150 ft Assist level: 2 helpers (mod assist + 2nd person for safety)   Education administrator activity did not occur: Safety/medical concerns   Max wheelchair distance: 10 Assist Level: Maximal assistance (Pt 25 - 49%)  Cognition Comprehension Comprehension assist level: Understands basic 50 - 74% of the time/ requires cueing 25 - 49% of the time  Expression Expression assist level: Expresses basic 50 - 74% of the time/requires cueing 25 - 49% of the time. Needs to repeat parts of sentences.  Social Interaction Social Interaction assist level: Interacts appropriately 50 - 74% of the time - May be physically or verbally inappropriate.  Problem Solving Problem solving assist level: Solves basic 25 - 49% of the time - needs direction more than half the time to initiate, plan or complete simple activities  Memory Memory assist level: Recognizes or recalls 25 - 49% of the time/requires cueing 50 - 75% of the time  Medical Problem List and Plan: 1. Decreased functional mobility with cognitive deficitssecondary to right IPH/SDH/temporal bone fracture after fall S/P right frontal temporoparietal craniectomy 01/14/2017 with placement of bone flap in the abdomen  Cont CIR  -RLAS V+  -team conference today 2. DVT Prophylaxis/Anticoagulation: Subcutaneous heparin. Monitor platelet counts and any signs of bleeding. 3. Pain Management: Hycet  4. Mood: Ativan as needed 5. Neuropsych: This patient is notcapable of making decisions on hisown behalf.  -HS seroquel for sleep/agitation  -limiting benzos  -continue ritalin for arousal, attention and initiation-  bid  -enclosure bed for  safety still required-renewed  -soft helmet for safety 6. Skin/Wound Care: Routine skin checks  -helmet for cranium protection  -flap site intact  -crani incision much improved---can leave wound alone/open to dry air  -cbc stable yesterday  -pt is not picking at wound any longer 7. Fluids/Electrolytes/Nutrition:    -intake reasonable 8.Seizure prophylaxis. Keppra 750 mg twice daily 9.Pneumonia. resolved 10.Dysphagia. On D1 thins. Taking meds without issues   11.Urinary retention/recent Escherichia coli UTI s/p course of Cipro.   - requires I/O caths still   - repeat urine culture with 100k coag neg staph---will go ahead and continue macrobid for 7 days total  -continue urecholine---increased to    -timed voids. Needs to be up to toilet.  12.Diabetes mellitus with peripheral neuropathy. Hemoglobin A1c 10.4.  Patient on Glucophage 500 mg twice a day and Glucotrol 5 mg daily at home  -reasonable control at present  -lantus currently at 15u qhs 13.History of hypertension. Patient on lisinopril 2.5 mg daily prior to admission. Resume if >130/85 Blood pressure currently on the low side. May be medication related, possibly Urecholine   Vitals:   03/01/17 1510 03/02/17 0511  BP: 102/68 108/63  Pulse: (!) 106 86  Resp: 19 18  Temp: 98.2 F (36.8 C) 98.1 F (36.7 C)   14.Constipation. Laxative assistance     LOS (Days) 29 A FACE TO FACE EVALUATION WAS PERFORMED  [name], MD 03/02/2017 8:31 AM

## 2017-03-02 NOTE — Progress Notes (Signed)
Nutrition Follow-up  DOCUMENTATION CODES:   Not applicable  INTERVENTION:  Continue Glucerna Shake po TID, each supplement provides 220 kcal and 10 grams of protein.  Encourage adequate PO intake.   NUTRITION DIAGNOSIS:   Increased nutrient needs related to acute illness as evidenced by estimated needs; ongoing  GOAL:   Patient will meet greater than or equal to 90% of their needs; met  MONITOR:   PO intake, Supplement acceptance, Labs, Weight trends, Skin, I & O's, Diet advancement  REASON FOR ASSESSMENT:   Consult Enteral/tube feeding initiation and management  ASSESSMENT:   [name] with history of hypertension, diabetes mellitus. Presented 01/10/2017 after being found down from a supposed fall. CT of the head and imaging revealed intracranial hemorrhage with a 4.5 cm right frontal intraparenchymal hemorrhage, 6 mm right subdural hematoma and other scattered areas of intraparenchymal and subarachnoid hemorrhage with right to left midline shift. Nondisplaced vertical fracture through the left temporal bone.  Underwent right frontal temporal parietal craniectomy for hematoma evacuation 01/14/2017 per Dr. Cyndy Freeze with placement of bone flap in the abdomen.  Nasogastric tube in place for nutritional support patient remains NPO  Pt continues on a dysphagia 1 diet with thin liquids. Meal completion has been mostly 100%. Pt currently has Glucerna shake ordered and has been consuming them. Intake has been adequate. RD to continue with current orders to aid in increased nutrition needs.   Diet Order:  DIET - DYS 1 Room service appropriate? Yes; Fluid consistency: Thin  Skin:  Wound (see comment) (Incision on head R)  Last BM:  4/30  Height:   Ht Readings from Last 1 Encounters:  02/01/17 '5\' 2"'$  (1.575 m)    Weight:   Wt Readings from Last 1 Encounters:  03/01/17 128 lb 15.5 oz (58.5 kg)    Ideal Body Weight:  53.6 kg  BMI:  Body  mass index is 23.59 kg/m.  Estimated Nutritional Needs:   Kcal:  1800-2000  Protein:  100-115 grams  Fluid:  >/= 1.8 L/day  EDUCATION NEEDS:   No education needs identified at this time  Corrin Parker, MS, RD, LDN Pager # [phone] After hours/ weekend pager # [phone]

## 2017-03-03 ENCOUNTER — Inpatient Hospital Stay (HOSPITAL_COMMUNITY): Payer: Self-pay | Admitting: Physical Therapy

## 2017-03-03 ENCOUNTER — Inpatient Hospital Stay (HOSPITAL_COMMUNITY): Payer: Self-pay | Admitting: Speech Pathology

## 2017-03-03 ENCOUNTER — Inpatient Hospital Stay (HOSPITAL_COMMUNITY): Payer: Self-pay | Admitting: Occupational Therapy

## 2017-03-03 LAB — GLUCOSE, CAPILLARY
GLUCOSE-CAPILLARY: 104 mg/dL — AB (ref 65–99)
Glucose-Capillary: 135 mg/dL — ABNORMAL HIGH (ref 65–99)
Glucose-Capillary: 130 mg/dL — ABNORMAL HIGH (ref 65–99)
Glucose-Capillary: 132 mg/dL — ABNORMAL HIGH (ref 65–99)

## 2017-03-03 MED ORDER — QUETIAPINE FUMARATE 25 MG PO TABS
25.0000 mg | ORAL_TABLET | Freq: Every day | ORAL | Status: DC
  Administered 2017-03-04 – 2017-03-08 (×5): 25 mg via ORAL
  Filled 2017-03-03 (×6): qty 1

## 2017-03-03 MED ORDER — LEVETIRACETAM 500 MG PO TABS
500.0000 mg | ORAL_TABLET | Freq: Two times a day (BID) | ORAL | Status: DC
  Administered 2017-03-03 – 2017-03-14 (×24): 500 mg via ORAL
  Filled 2017-03-03 (×24): qty 1

## 2017-03-03 MED ORDER — HYDROCODONE-ACETAMINOPHEN 5-325 MG PO TABS
1.0000 | ORAL_TABLET | Freq: Four times a day (QID) | ORAL | Status: DC | PRN
  Administered 2017-03-07 – 2017-03-12 (×4): 1 via ORAL
  Filled 2017-03-03 (×5): qty 1

## 2017-03-03 NOTE — Progress Notes (Signed)
Speech Language Pathology Daily Session Note  Patient Details  Name: [name] MRN: [id number] Date of Birth: [birth year]  Today's Date: 03/03/2017 SLP Individual Time: 1300-1400 SLP Individual Time Calculation (min): 60 min  Short Term Goals: Week 5: SLP Short Term Goal 1 (Week 5): Patient will consume current diet with minimal overt s/s of aspiration with supervision verbal cues for use of swallowing compensatory strategies.  SLP Short Term Goal 2 (Week 5): Patient will demonstrate efficient mastication with mild oral residue and minimal overt s/s of aspiration with Mod A verbal cues over 2 sessions prior to upgrade.  SLP Short Term Goal 3 (Week 5): Patient will demonstrate sustained attention to a functional task for ~10 minutes with Mod A verbal cues for redirection.  SLP Short Term Goal 4 (Week 5): Patient will orient to place, situation and time with Mod  A multimodal cues.  SLP Short Term Goal 5 (Week 5): Patient will demonstrate basic problem solving for basic tasks with Max A verbal cues.   Skilled Therapeutic Interventions:  Pt was seen for pm session targeting dysphagia and cognition and language. Interpreter was present. Pt currently on D1/thin and trials of D2 and solid were continued. Pt exhibited some pocketing in the R sulcus that required max cues to clear. Pt was able to use liquid wash to clear some residue. Pt was unable to fully use tongue sweep to clear. Pt is still high risk for possible aspiration of solids given his poor awareness of residue. Continue on puree/thin, crushed med and continue therapeutic trials of solids. Pt completed simple confrontation naming task with min assist with 100% acc. Pt was challenged with a sequencing task targeting low level problem solving with max cues with 25% acc. Pt was challenged with a card game task challenging memory and problem solving with max cues to complete without accuracy. Pt was not able to recall placement of 6 cards  with max cues. Pt was able to match numbers of different suits showing knowledge of similarities with 80% acc. Pt was able to name the correct season, and month. Pt recalled DOW with max assist. Pt was not oriented to year. Continue with current goals.      Function:  Eating Eating   Modified Consistency Diet: Yes Eating Assist Level: More than reasonable amount of time;Helper checks for pocketed food   Eating Set Up Assist For: Opening containers       Cognition Comprehension Comprehension assist level: Understands basic 25 - 49% of the time/ requires cueing 50 - 75% of the time  Expression Expression assistive device:  (interpreter) Expression assist level: Expresses basic 50 - 74% of the time/requires cueing 25 - 49% of the time. Needs to repeat parts of sentences.  Social Interaction Social Interaction assist level: Interacts appropriately 50 - 74% of the time - May be physically or verbally inappropriate.  Problem Solving Problem solving assist level: Solves basic less than 25% of the time - needs direction nearly all the time or does not effectively solve problems and may need a restraint for safety  Memory Memory assist level: Recognizes or recalls less than 25% of the time/requires cueing greater than 75% of the time    Pain Pain Assessment Pain Assessment: No/denies pain  Therapy/Group: Individual Therapy  Isaac Laud 03/03/2017, 2:33 PM

## 2017-03-03 NOTE — Patient Care Conference (Signed)
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 Essential hypertension   . Agitated   . Lethargy   . Benign essential HTN   . Poorly controlled type 2 diabetes mellitus with peripheral neuropathy (HCC)   . 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 Aphasia 02/01/2017  . Dysphagia, oropharyngeal 02/01/2017  . Intracerebral hemorrhage (HCC) 01/14/2017  . Fall 01/10/2017  . Subdural hemorrhage (HCC) 01/10/2017  . Diabetes type 2, uncontrolled (HCC) 06/13/2015    Expected Discharge Date: Expected Discharge Date: 03/12/17  Team Members Present: Physician leading conference: Dr. Faith Rogue Social Worker Present: Amada Jupiter, LCSW Nurse Present: Chana Bode, RN PT Present: Karolee Stamps, PT;Victoria Hyacinth Meeker, PT OT Present: Perrin Maltese, OT SLP Present: Feliberto Gottron, SLP PPS Coordinator present : Tora Duck, RN, CRRN     Current Status/Progress Goal Weekly Team Focus  Medical   slow progress. still quite confused. wound with infection/drainage due to patient picking at wound  normalize cognition/behavior  improve bladder emptying, wound treatment/resolution, rx uti   Bowel/Bladder   Continent of bowe/; I&O cath q 8; LBM 03/01/17  to be able to urinateon a normal basis  Timed toileting q 2 hrs and as needed    Swallow/Nutrition/ Hydration   Dys. 1 textures with thin liquids, Min A  Min A with least restrictive diet  trials of upgraded textures, use of swallowing strategies    ADL's   Min A bathing at shower level, supervision UB dressing, Min A LB dressing, Min A functional stand pivot transfers   Min-Mod A overall, dressing goals upgraded due to progress  ADL retraining, cognitive remediation, dynamic balance, family education, NMR, functional transfers    Mobility   supervision bed mobility, min assist transfers, +2 assist for ambulation without AD & stairs with B rails (Pt = 50%)  min/mod assist overall  cognitive remediation, NMR, balance, gait, stair negotiation, strengthening, endurance training   Communication   Min A  Min A  following commands    Safety/Cognition/ Behavioral Observations  Max A  Mod A  initiation, orientation, attention, problem solving    Pain   no complain of pain  <2  assess and treat pain q shift and as needed   Skin   s/p crani wound-head healing; patiwent scratched site at times  skin to be free of breakdown/ infection with min assist  assess skin q shift and as needed    Rehab Goals Patient on target to meet rehab goals: Yes *See Care Plan and progress notes for long and short-term goals.  Barriers to Discharge: see prior, family anxiety over care needs    Possible Resolutions to Barriers:  continued cognitive and behavioral remediation    Discharge Planning/Teaching Needs:  Plan for  pt to d/c home with sister and her family.  Sister to be primary caregiver and aware need for 24/7 support.  Teaching to be ongoing.   Team Discussion:  Incision site healing but pt continues to "pick".  Slow improvement with cognition, awareness and attention.  Some movement in left arm.  Currently min assist with ADLs and mod a with ambulation but fatigues very quickly.  Still doing trials or D2 but very pays very poor attention to food in mouth.  Still needing I/O cath.   MD hopes will improve as cognition improves.  Much discussion about possibility of extending LOS to reach better level of functional that sister can manage at home.  Sister remains very concerned and anxious about d/c.  Revisions to Treatment Plan:  None at this point   Continued Need for Acute Rehabilitation Level of Care: The patient requires daily medical management by a physician with specialized training in physical medicine and rehabilitation for the following conditions: Daily direction of a multidisciplinary physical rehabilitation program to ensure safe treatment while eliciting the highest outcome that is of practical value to the patient.: Yes Daily medical management of patient stability for increased activity during participation in an intensive rehabilitation regime.: Yes Daily analysis of laboratory values and/or radiology reports with any subsequent need for medication adjustment of medical intervention for : Post surgical problems;Wound care problems;Urological problems  [name] 03/03/2017, 8:14 AM

## 2017-03-03 NOTE — Progress Notes (Signed)
[place] PHYSICAL MEDICINE & REHABILITATION     PROGRESS NOTE    Subjective/Complaints: In bed without complaints. Denies pain.   ROS: Limited due cognitive/behavioral      Objective: Vital Signs: Blood pressure 100/65, pulse 85, temperature 98.1 F (36.7 C), temperature source Oral, resp. rate 18, height  (1.575 m), weight 58.5 kg (128 lb 15.5 oz), SpO2 98 %. No results found. No results for input(s): WBC, HGB, HCT, PLT in the last 72 hours. No results for input(s): NA, K, CL, GLUCOSE, BUN, CREATININE, CALCIUM in the last 72 hours.  Invalid input(s): CO CBG (last 3)   Recent Labs  03/02/17 1742 03/02/17 2040 03/03/17 0627  GLUCAP 114* 168* 135*    Wt Readings from Last 3 Encounters:  03/01/17 58.5 kg (128 lb 15.5 oz)  02/01/17 65.6 kg (144 lb 9.6 oz)  07/31/16 68 kg (150 lb)    Physical Exam:    Gen: NAD. Well developed.  HENT:  Craniectomy scar  Cardiovascular:   RRR Respiratory:  CTA B GI: Soft. Bowel sounds are normal. Neurological:   Left inattention. Engages left side with cueing. More alert over all.  Psychiatric:   Flat but cooperative, pleasant Skin. Warm and dry. Crani wound closing nicely. No drainage. Areas of scab which are decreasing.   Assessment/Plan: 1. Cognitive and functional deficits secondary to right traumatic ICH/SDH s/p craniectomy which require 3+ hours per day of interdisciplinary therapy in a comprehensive inpatient rehab setting. Physiatrist is providing close team supervision and 24 hour management of active medical problems listed below. Physiatrist and rehab team continue to assess barriers to discharge/monitor patient progress toward functional and medical goals.  Function:  Bathing Bathing position   Position: Shower  Bathing parts Body parts bathed by patient: Right arm, Left arm, Chest, Abdomen, Right upper leg, Left upper leg, Right lower leg, Left lower leg, Front perineal area Body parts bathed by helper:  Buttocks  Bathing assist Assist Level: Touching or steadying assistance(Pt > 75%)      Upper Body Dressing/Undressing Upper body dressing   What is the patient wearing?: Pull over shirt/dress     Pull over shirt/dress - Perfomed by patient: Thread/unthread right sleeve, Thread/unthread left sleeve, Put head through opening, Pull shirt over trunk Pull over shirt/dress - Perfomed by helper: Pull shirt over trunk        Upper body assist Assist Level: Supervision or verbal cues   Set up : To obtain clothing/put away  Lower Body Dressing/Undressing Lower body dressing   What is the patient wearing?: Underwear, Pants, [employer], Shoes, Non-skid slipper socks Underwear - Performed by patient: Thread/unthread right underwear leg, Thread/unthread left underwear leg, Pull underwear up/down   Pants- Performed by patient: Thread/unthread right pants leg, Thread/unthread left pants leg, Pull pants up/down Pants- Performed by helper: Pull pants up/down Non-skid slipper socks- Performed by patient: Don/doff left sock, Don/doff right sock Non-skid slipper socks- Performed by helper: Don/doff right sock, Don/doff left sock   Socks - Performed by helper: Don/doff right sock, Don/doff left sock Shoes - Performed by patient: Don/doff right shoe, Don/doff left shoe, Fasten right, Fasten left Shoes - Performed by helper: Fasten right, Fasten left     TED Hose - Performed by patient: Don/doff right TED hose TED Hose - Performed by helper: Don/doff left TED hose  Lower body assist Assist for lower body dressing: Touching or steadying assistance (Pt > 75%)      Toileting Toileting Toileting activity did not occur:  Safety/medical concerns Toileting steps completed by patient: Performs perineal hygiene Toileting steps completed by helper: Adjust clothing prior to toileting, Adjust clothing after toileting Toileting Assistive Devices: Grab bar or rail  Toileting assist Assist level: Touching or  steadying assistance (Pt.75%)   Transfers Chair/bed transfer Chair/bed transfer activity did not occur:  (standard chair to w/c) Chair/bed transfer method: Stand pivot Chair/bed transfer assist level: Moderate assist (Pt 50 - 74%/lift or lower) Chair/bed transfer assistive device: Armrests Mechanical lift: Scientist, water quality activity did not occur: Safety/medical concerns   Max distance: 80 ft Assist level: Maximal assist (Pt 25 - 49%)   Wheelchair Wheelchair activity did not occur: Safety/medical concerns   Max wheelchair distance: 10 Assist Level: Maximal assistance (Pt 25 - 49%)  Cognition Comprehension Comprehension assist level: Understands basic 50 - 74% of the time/ requires cueing 25 - 49% of the time  Expression Expression assist level: Expresses basic 50 - 74% of the time/requires cueing 25 - 49% of the time. Needs to repeat parts of sentences.  Social Interaction Social Interaction assist level: Interacts appropriately 50 - 74% of the time - May be physically or verbally inappropriate.  Problem Solving Problem solving assist level: Solves basic 25 - 49% of the time - needs direction more than half the time to initiate, plan or complete simple activities  Memory Memory assist level: Recognizes or recalls less than 25% of the time/requires cueing greater than 75% of the time  Medical Problem List and Plan: 1. Decreased functional mobility with cognitive deficitssecondary to right IPH/SDH/temporal bone fracture after fall S/P right frontal temporoparietal craniectomy 01/14/2017 with placement of bone flap in the abdomen  Cont CIR  -RLAS V+  -making some cognitive progress/fatigues during day  -sister still reluctant to commit 2. DVT Prophylaxis/Anticoagulation: Subcutaneous heparin. Monitor platelet counts and any signs of bleeding. 3. Pain Management: Hycet  4. Mood: Ativan as needed 5. Neuropsych: This patient is notcapable of making decisions on  hisown behalf.  -HS seroquel for sleep/agitation, reduce to    -limiting benzos  -continue ritalin for arousal, attention and initiation- titrate to  bid  -enclosure bed for safety still required-renewed  -soft helmet for safety 6. Skin/Wound Care: Routine skin checks  -helmet for cranium protection  -flap site intact  -crani incision much improved---  7. Fluids/Electrolytes/Nutrition:    -intake reasonable 8.Seizure prophylaxis. Keppra 750 mg twice daily 9.Pneumonia. resolved 10.Dysphagia. On D1 thins. Taking meds without issues   11.Urinary retention/recent Escherichia coli UTI s/p course of Cipro.   - requires I/O caths still   - repeat urine culture with 100k coag neg staph--- macrobid for 7 days total  -continue urecholine---increased to    -timed voids. Needs to be up to toilet.  -suspect cognitive/apraxic factors to emptying currently---continue with plan  12.Diabetes mellitus with peripheral neuropathy. Hemoglobin A1c 10.4.  Patient on Glucophage 500 mg twice a day and Glucotrol 5 mg daily at home  -reasonable control at present  -lantus currently at 15u qhs 13.History of hypertension. Patient on lisinopril 2.5 mg daily prior to admission. Resume if >130/85 Blood pressure currently low/stable    Vitals:   03/02/17 1500 03/03/17 0516  BP: 110/60 100/65  Pulse: 90 85  Resp: 19 18  Temp: 98.4 F (36.9 C) 98.1 F (36.7 C)   14.Constipation. Laxative assistance     LOS (Days) 30 A FACE TO FACE EVALUATION WAS PERFORMED  [name], MD 03/03/2017 8:40 AM

## 2017-03-03 NOTE — Progress Notes (Signed)
Occupational Therapy Session Note  Patient Details  Name: [name] MRN: [id number] Date of Birth: January 09, 1966  Today's Date: 03/03/2017 OT Individual Time: 1100-1200 OT Individual Time Calculation (min): 60 min     Skilled Therapeutic Interventions/Progress Updates: 1:1 Pt did excellent today demonstrated but decr cues for functional problem solving with extra time.  Self care retraining at shower level. Pt ambulated with mod A from bed to bathroom. Pt voided BM and urine in the toilet and able to perform 2/2 toileting steps with more than reasonable amt of time and setup of supplies. Pt transitioned into shower stall with min A. Pt continues to demonstrate poor body awareness and preperception when transitioning to different surface or aligning body up in prep for an activity requiring hands on facilitation. Pt still requires setup with washcloth with soap to successful be able to cleanse self but only minimal cuing for washing 10/10 parts sit to stand. Ambulated to dresser to dress and pick out clothing. Mod cuing for picking out all items needed to dress. Pt able to don shirt with steadying A in standing with 100% accuracy.  Pt donned both shoes and tied them without realizing they were on the wrong feet. When pointed it out  The error with questing cue pt laughed when realized they were not right but was not able to self correct requiring mod A to correct mistake due to perceptual deficits. Pt brushed teeth in standing and was able to shave 50% of himself. Left in chair with sister in prep for lunch.     Therapy Documentation Precautions:  Precautions Precautions: Fall, Other (comment) Precaution Comments: bone flap removed R side of head in abdomen; hemet for OOB; Pt no longer with NG or PICC; enclosure bed Required Braces or Orthoses: Other Brace/Splint Other Brace/Splint: helmet for OOB Restrictions Weight Bearing Restrictions: No Pain: No c/o pain See Function Navigator  for Current Functional Status.   Therapy/Group: Individual Therapy  Roney Mans [facility] 03/03/2017, 2:08 PM

## 2017-03-03 NOTE — Progress Notes (Signed)
Physical Therapy Session Note  Patient Details  Name: [name] MRN: [id number] Date of Birth: Sep 15, 1966  Today's Date: 03/03/2017 PT Individual Time: 805-900 and 1001-1059 and 4098-1191 PT Individual Time Calculation (min): 55 min and 58 min and 27 min  Short Term Goals: Week 5:  PT Short Term Goal 1 (Week 5): Pt will negotiate 4 steps with mod assist +1. PT Short Term Goal 2 (Week 5): Pt will consistently ambulate 100 ft with mod assist +1.  Skilled Therapeutic Interventions/Progress Updates:  Treatment 1: Pt received sitting on EOB in care of NT. Pt without pants on and therapist reeducated pt on appropriate clothing when visitors/others are in the room. Pt donned pants, socks, and shoes with min assist for threading pants on BLE and min assist for socks. Pt required min assist for standing balance to pull pants over hips. During session pt ambulated room>BI gym>main gym>room with heavy min assist without AD. Pt requires cuing for path-finding and assistance to maintain balance. In BI gym pt engaged in standing balance activity with focus on LUE NMR; pt able to locate all lights on dynavision with slightly slower reaction time to L side of board. Pt required max tactile cuing to prevent him from using RUE during task. Pt able to tolerate standing and attend to task for max of 3 minutes with min assist for balance. In main gym pt engaged in connect 4 game with focus being on attention to task, ability to follow instructions, and LUE NMR. Pt able to utilize LUE to manipulate and place coins in board with extra time and cuing. Pt required cuing 30% of the time to correctly play game (alternating turns, taking one turn at a time). Pt also engaged in [facility] of buttoning buttons; pt continues to rely heavily on RUE and requires moderate assistance to complete task. At end of session pt left in enclosure bed with all needs within reach. Pt inconsistently oriented to location & situation  throughout session, and still unable to recall correct year or date.  Treatment 2: Pt received in bed with sister present for session. Pt without c/o or behaviors demonstrating pain. Pt transferred to EOB & required min assist with max cuing to correctly don shoes. Throughout session pt ambulated room>dayroom>gym>room with heavy min assist without AD. Pt continues to demonstrate decreased weight shifting R and impaired step length BLE. Pt also requires cuing to not attempt to grab for rail or other objects for support. Pt did experience 1 episode of knee buckling requiring mod assist to prevent loss of balance. In day room pt engaged in cognitive task, arranging cards with days of week and months of year in order. Pt required mod cuing for arranging days of the week but was able to complete months of the year with only occasional cuing. Pt utilized cybex kinetron in sitting with max multimodal cuing to attend and complete task as pt demonstrates impaired use of BLE (L>R). During session therapist educated pt's sister on TBI, pt's current abilities, and expected progress with TBI - provided her with handout as well. [name] does not seem to have full understanding of TBI and would benefit from continued education. At end of session pt left in enclosure bed in room with sister present to supervise & all needs within reach.  Treatment 3: Pt received in w/c. No c/o or behaviors noting pain during session. Pt ambulated gym<>ortho gym without AD and mod assist 2/2 impaired balance and pt consistently leaning forwards; pt also requires  cuing to decreased gait speed as he is impulsive and attempts to hurry to resting spot. Pt completed car transfer from SUV simulated height with min assist with improving ability to transfer LE in/out of car but requires extra time to attend to LLE. While sitting in w/c pt engaged in card matching game (pt unable to recall he had engaged in this activity the past 2 days) with LUE with  task focusing on LUE [facility] and manipulation of objects as well as cognitive remediation. Pt required max cuing to make correct matches and for error correction as pt without awareness of errors. At end of session pt returned to room and required mod assist to transfer into bed as pt attempted to crawl into bed and lost balance requiring assistance to prevent fall. At end of session pt left in enclosure bed with all needs within reach.   Therapy Documentation Precautions:  Precautions Precautions: Fall, Other (comment) Precaution Comments: bone flap removed R side of head in abdomen; hemet for OOB; Pt no longer with NG or PICC; enclosure bed Required Braces or Orthoses: Other Brace/Splint Other Brace/Splint: helmet for OOB Restrictions Weight Bearing Restrictions: No   See Function Navigator for Current Functional Status.   Therapy/Group: Individual Therapy  Sandi Mariscal 03/03/2017, 3:26 PM

## 2017-03-04 ENCOUNTER — Inpatient Hospital Stay (HOSPITAL_COMMUNITY): Payer: Self-pay | Admitting: Speech Pathology

## 2017-03-04 ENCOUNTER — Inpatient Hospital Stay (HOSPITAL_COMMUNITY): Payer: Self-pay | Admitting: Occupational Therapy

## 2017-03-04 ENCOUNTER — Inpatient Hospital Stay (HOSPITAL_COMMUNITY): Payer: Self-pay

## 2017-03-04 ENCOUNTER — Encounter (HOSPITAL_COMMUNITY): Payer: Self-pay | Admitting: Emergency Medicine

## 2017-03-04 LAB — GLUCOSE, CAPILLARY
GLUCOSE-CAPILLARY: 105 mg/dL — AB (ref 65–99)
GLUCOSE-CAPILLARY: 121 mg/dL — AB (ref 65–99)
GLUCOSE-CAPILLARY: 86 mg/dL (ref 65–99)
Glucose-Capillary: 150 mg/dL — ABNORMAL HIGH (ref 65–99)
Glucose-Capillary: 112 mg/dL — ABNORMAL HIGH (ref 65–99)

## 2017-03-04 MED ORDER — BETHANECHOL CHLORIDE 25 MG PO TABS
50.0000 mg | ORAL_TABLET | Freq: Three times a day (TID) | ORAL | Status: DC
  Administered 2017-03-04 – 2017-03-11 (×20): 50 mg via ORAL
  Filled 2017-03-04 (×21): qty 2

## 2017-03-04 NOTE — Progress Notes (Signed)
Occupational Therapy Session Note  Patient Details  Name: [name] MRN: [id number] Date of Birth: 1966/04/29  Today's Date: 03/04/2017 OT Individual Time: 0800-0900 OT Individual Time Calculation (min): 60 min    Short Term Goals: Week 3:  OT Short Term Goal 1 (Week 3): Pt will perform toileting with max A in order to decrease assistance with functional task.  OT Short Term Goal 1 - Progress (Week 3): Met OT Short Term Goal 2 (Week 3): Pt will utilize L UE in functional tasks with mod multimodal cues. OT Short Term Goal 2 - Progress (Week 3): Met OT Short Term Goal 3 (Week 3): Pt will initiate sit <>stand for LB clothing management with mod A.  OT Short Term Goal 3 - Progress (Week 3): Met Week 4:  OT Short Term Goal 1 (Week 4): Pt will transfer to toilet with min A with family member OT Short Term Goal 2 (Week 4): Pt will demonstrate intellecual awarenss with mod A (of at least two things) OT Short Term Goal 3 (Week 4): Pt will transfer into tub shower without grab bars with LRAD with min A  OT Short Term Goal 4 (Week 4): Pt will be oriented x3 with external aids with mod A  Skilled Therapeutic Interventions/Progress Updates:    Pt resting in enclosure bed upon arrival with NT and interpreter present.  Pt engaged in BADL retraining including bathing at shower level and dressing with sit<>stand from seat.  Focus on task initiation, sequencing, functional amb with HHA, LUE use, standing balance, and safety awareness to increase independence with BADLs.  Pt initiated rubbing bar of soap onto bath cloth using BUE but required assistance for thoroughness.  Pt completed bathing tasks without physical assistance but required min verbal cues for thoroughness.  Pt completed dressing tasks with steady A for sit<>stand and standing balance to pull up pants.  Pt amb in room to gather soiled towels and place in dirty linen bag.  Pt stood at sink to brush teeth requiring steady A for standing  balance and assistance applying toothpaste to toothbrush.  Pt returned to enclosure bed and secured.   Therapy Documentation Precautions:  Precautions Precautions: Fall, Other (comment) Precaution Comments: bone flap removed R side of head in abdomen; hemet for OOB; Pt no longer with NG or PICC; enclosure bed Required Braces or Orthoses: Other Brace/Splint Other Brace/Splint: helmet for OOB Restrictions Weight Bearing Restrictions: No Pain:  Pt denied pain  See Function Navigator for Current Functional Status.   Therapy/Group: Individual Therapy  [name] 03/04/2017, 9:06 AM

## 2017-03-04 NOTE — Progress Notes (Signed)
Social Work Patient ID: Loni BeckwithMiguel Garcia Cardenas, male   DOB: [birth year], 51 y.o.   MRN: [id number]   Have reviewed team conf with pt's sister who is aware that we are continuing to aim for d/c on 5/11 but will revisit this date next week to determine if extension is warranted.  She continues to be extremely anxious about providing pt's care at home 24/7.  Discussed with her to plan to begin "hands on" next week to allow her to (hopefully) feel more comfortable with providing care and she is agreeable.  Will follow up with her on Monday.  Aurthur Wingerter, LCSW

## 2017-03-04 NOTE — Progress Notes (Signed)
Occupational Therapy Note  Patient Details  Name: Jamie BeckwithMiguel Garcia Berg MRN: [id number] Date of Birth: 06-05-1966  Today's Date: 03/04/2017 OT Individual Time: 1345-1430 OT Individual Time Calculation (min): 45 min   Pt denied pain Individual Therapy  Pt resting in enclosure bed upon arrival and agreeable to getting OOB and participating in therapy.  Pt sat EOB at supervision level and performed a squat pivot transfer to chair with steady A.  Pt donned Ted hose and shoes with max verbal cues for sequencing and thoroughness.  Pt amb with HHA to therapy gym.  Pt requires max multimodal cues for orientation in space when ambulating with no specific target and when first standing up and initiating ambulation.  Pt continues to prefer using LUE on rail in hallway but after walking several feet, pt exhibits increased comfort with walking with HHA on R.  Pt required tot A for orientation to date, place, and situation.  Pt returned to enclosure bed secured and all needs within reach.    Lavone NeriLanier, [name] [facility] 03/04/2017, 2:47 PM

## 2017-03-04 NOTE — Progress Notes (Signed)
[facility]     PROGRESS NOTE    Subjective/Complaints: In bed sleeping. Awoke easily  ROS: pt denies nausea, vomiting, diarrhea, cough, shortness of breath or chest pain       Objective: Vital Signs: Blood pressure 106/69, pulse 88, temperature 98.2 F (36.8 C), temperature source Oral, resp. rate 18, height 5\' 2"  (1.575 m), weight 61.1 kg (134 lb 11.2 oz), SpO2 100 %. No results found. No results for input(s): WBC, HGB, HCT, PLT in the last 72 hours. No results for input(s): NA, K, CL, GLUCOSE, BUN, CREATININE, CALCIUM in the last 72 hours.  Invalid input(s): CO CBG (last 3)   Recent Labs  03/03/17 1627 03/03/17 2040 03/04/17 0641  GLUCAP 104* 132* 150*    Wt Readings from Last 3 Encounters:  03/03/17 61.1 kg (134 lb 11.2 oz)  02/01/17 65.6 kg (144 lb 9.6 oz)  07/31/16 68 kg (150 lb)    Physical Exam:    Gen: NAD. Well developed.  HENT:  Craniectomy scar  Cardiovascular:   RRR Respiratory:  CTA B GI: Soft. Bowel sounds are normal. Neurological:   Left inattention still present. Engages left side with cueing. More alert over all and less restless.  Psychiatric:   Flat but cooperative, pleasant Skin. Warm and dry. Crani wound closing nicely. No drainage. Areas of scab which are decreasing.   Assessment/Plan: 1. Cognitive and functional deficits secondary to right traumatic ICH/SDH s/p craniectomy which require 3+ hours per day of interdisciplinary therapy in a comprehensive inpatient rehab setting. Physiatrist is providing close team supervision and 24 hour management of active medical problems listed below. Physiatrist and rehab team continue to assess barriers to discharge/monitor patient progress toward functional and medical goals.  Function:  Bathing Bathing position   Position: Shower  Bathing parts Body parts bathed by patient: Right arm, Left arm, Chest, Abdomen, Right upper leg, Left upper leg, Right lower leg,  Left lower leg, Front perineal area, Buttocks Body parts bathed by helper: Back  Bathing assist Assist Level: Touching or steadying assistance(Pt > 75%)      Upper Body Dressing/Undressing Upper body dressing   What is the patient wearing?: Pull over shirt/dress     Pull over shirt/dress - Perfomed by patient: Thread/unthread right sleeve, Thread/unthread left sleeve, Put head through opening, Pull shirt over trunk Pull over shirt/dress - Perfomed by helper: Pull shirt over trunk        Upper body assist Assist Level: Touching or steadying assistance(Pt > 75%) (in standing)   Set up : To obtain clothing/put away  Lower Body Dressing/Undressing Lower body dressing   What is the patient wearing?: Underwear, Pants, Shoes, Socks Underwear - Performed by patient: Thread/unthread right underwear leg, Thread/unthread left underwear leg, Pull underwear up/down   Pants- Performed by patient: Thread/unthread right pants leg, Thread/unthread left pants leg, Pull pants up/down Pants- Performed by helper: Pull pants up/down Non-skid slipper socks- Performed by patient: Don/doff left sock, Don/doff right sock Non-skid slipper socks- Performed by helper: Don/doff right sock, Don/doff left sock Socks - Performed by patient: Don/doff right sock, Don/doff left sock Socks - Performed by helper: Don/doff right sock, Don/doff left sock Shoes - Performed by patient: Don/doff right shoe, Don/doff left shoe, Fasten right, Fasten left Shoes - Performed by helper: Fasten right, Fasten left     TED Hose - Performed by patient: Don/doff right TED hose TED Hose - Performed by helper: Don/doff left TED hose  Lower body assist Assist  for lower body dressing: Touching or steadying assistance (Pt > 75%)      Toileting Toileting Toileting activity did not occur: Safety/medical concerns Toileting steps completed by patient: Adjust clothing prior to toileting, Performs perineal hygiene (2/2 steps) Toileting  steps completed by helper: Adjust clothing prior to toileting, Adjust clothing after toileting Toileting Assistive Devices: Grab bar or rail  Toileting assist Assist level: Touching or steadying assistance (Pt.75%)   Transfers Chair/bed transfer Chair/bed transfer activity did not occur:  (standard chair to w/c) Chair/bed transfer method: Ambulatory Chair/bed transfer assist level: Touching or steadying assistance (Pt > 75%) Chair/bed transfer assistive device: Armrests Mechanical lift: Engineer, [employer]ara   Locomotion Ambulation Ambulation activity did not occur: Safety/medical concerns   Max distance: 150 ft Assist level: Moderate assist (Pt 50 - 74%)   Wheelchair Wheelchair activity did not occur: Safety/medical concerns   Max wheelchair distance: 10 Assist Level: Maximal assistance (Pt 25 - 49%)  Cognition Comprehension Comprehension assist level: Understands basic 25 - 49% of the time/ requires cueing 50 - 75% of the time  Expression Expression assist level: Expresses basic 50 - 74% of the time/requires cueing 25 - 49% of the time. Needs to repeat parts of sentences.  Social Interaction Social Interaction assist level: Interacts appropriately 50 - 74% of the time - May be physically or verbally inappropriate.  Problem Solving Problem solving assist level: Solves basic less than 25% of the time - needs direction nearly all the time or does not effectively solve problems and may need a restraint for safety  Memory Memory assist level: Recognizes or recalls less than 25% of the time/requires cueing greater than 75% of the time  Medical Problem List and Plan: 1. 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 DVT Prophylaxis/Anticoagulation: Subcutaneous heparin. Monitor platelet counts and any signs  of bleeding. 3. Pain Management: Hycet  4. Mood: Ativan as needed 5. Neuropsych: This patient is notcapable of making decisions on hisown behalf.  -HS seroquel for sleep/agitation, reduce to 25mg    -limiting benzos  -continue ritalin for arousal, attention and initiation- titrate to 15mg  bid  -enclosure bed for safety still required-renewed  -soft helmet for safety 6. Skin/Wound Care: Routine skin checks  -helmet for cranium protection  -flap site intact  -crani incision much improved---  7. Fluids/Electrolytes/Nutrition:    -intake reasonable 8.Seizure prophylaxis. Keppra 500 mg twice daily. Hesitate to decrease further given craniectomy 9.Pneumonia. resolved 10.Dysphagia. On D1 thins. Taking meds without issues   11.Urinary retention/recent Escherichia coli UTI s/p course of Cipro.   - requires I/O caths still   - repeat urine culture with 100k coag neg staph--- macrobid for 7 days total  -continue urecholine---increase to 50 mg TID  -timed voids. Needs to be up to toilet.  -suspect cognitive/apraxic factors to emptying currently---continue with plan  12.Diabetes mellitus with peripheral neuropathy. Hemoglobin A1c 10.4.  Patient on Glucophage 500 mg twice a day and Glucotrol 5 mg daily at home  -reasonable control at present  -lantus currently at 15u qhs 13.History of hypertension. Patient on lisinopril 2.5 mg daily prior to admission. Resume if >130/85 Blood pressure currently low/stable    Vitals:   03/03/17 1302 03/04/17 0548  BP: 109/69 106/69  Pulse: 90 88  Resp: 18 18  Temp: 98.5 F (36.9 C) 98.2 F (36.8 C)  14.Constipation. Laxative assistance     LOS (Days) 31 A FACE TO FACE EVALUATION WAS PERFORMED  Faith Rogue T, MD 03/04/2017 8:06 AM

## 2017-03-04 NOTE — Progress Notes (Signed)
Occupational Therapy Note  Patient Details  Name: Loni BeckwithMiguel Garcia Cardenas MRN: [id number] Date of Birth: 09/18/1966  Today's Date: 03/04/2017 OT Individual Time: 1100-1145 OT Individual Time Calculation (min): 45 min   Pt denied pain Individual Therapy  Pt resting in enclosure bed upon arrival with sister and interpreter present.  Pt donned Ted hose and shoes with max verbal cues for thoroughnessPt amb with HHA (mod A) to therapy gym.  Pt initially unsteady with continual reaching for objects to hold onto when walking out of room.  Pt used rail walking in hallway with HHA (min A).  Pt engaged in assembling pipe tree structure.  Pt required multimodal cues to select correct pipe but was able to assemble correctly.  Pt attempted to engage LUE, after max verbal cues, in assembly of structure.  Pt attempted two structures.  Pt verbalized that he was tired and was "finished" but agreed to participate in additional task.  Pt challenged with colored peg pattern (replicating picture and actual pattern). Pt required max multimodal cues to complete task.  Pt amb with HHA and use of rails back to room and returned to enclosure bed with sister present.     Lavone NeriLanier, [place] Haub [facility] LP[facility]appell 03/04/2017, 11:51 AM

## 2017-03-04 NOTE — Progress Notes (Signed)
   Physical Therapy Note  Patient Details  Name: Loni BeckwithMiguel Garcia Cardenas MRN: [id number] Date of Birth: [birth year] Today's Date: 03/04/2017  1000-1100, 60 min individual tx Pain: none per pt  Pt compliant with donning of helmet by PT.  tx focused on gait training in hallway without AD, x 150' x 1, and x 150' requiring weaving around 3 cones, without encountering them, accurate 3/3.  therapeutic activities in standing, kicking Yoga block with RUE support, with good power LLE 50% of trials.  Kicking beach ball with good power LLE 75% of trials.  When asked to step backwards to w/c , pt spontaneously turned L, placed bil hands on rail and side stepped L, repeatedly.When turning to R to sit, pt used L hand sponteously to reach for armrest. Gait with RHHa to kick beach ball with either foot, x 20'.  From seated position in w/c, pt used L hand with mod assist to hold small watering can and use to water plants, accurately.      See function navigator for current status.   Waunetta Riggle 03/04/2017, 8:07 AM

## 2017-03-04 NOTE — Progress Notes (Signed)
Speech Language Pathology Daily Session Note  Patient Details  Name: [name] MRN: [id number] Date of Birth: 05-Jul-1966  Today's Date: 03/04/2017 SLP Individual Time: [id number] SLP Individual Time Calculation (min): 40 min  Short Term Goals: Week 5: SLP Short Term Goal 1 (Week 5): Patient will consume current diet with minimal overt s/s of aspiration with supervision verbal cues for use of swallowing compensatory strategies.  SLP Short Term Goal 2 (Week 5): Patient will demonstrate efficient mastication with mild oral residue and minimal overt s/s of aspiration with Mod A verbal cues over 2 sessions prior to upgrade.  SLP Short Term Goal 3 (Week 5): Patient will demonstrate sustained attention to a functional task for ~10 minutes with Mod A verbal cues for redirection.  SLP Short Term Goal 4 (Week 5): Patient will orient to place, situation and time with Mod  A multimodal cues.  SLP Short Term Goal 5 (Week 5): Patient will demonstrate basic problem solving for basic tasks with Max A verbal cues.   Skilled Therapeutic Interventions:  Pt was seen for skilled ST targeting goals for dysphagia and cognition.  SLP facilitated the session with a trial snack of dys 2 textures and thin liquids to continue working towards diet progressions.  Pt consumed advanced solids with mod-max assist multimodal cues for use of swallowing precautions due to impulsivity and poor awareness of oral residue.  Pt required mod assist multimodal cues to orient to place, date, and situation with the use of external aids in his room.  SLP facilitated the session with a basic card game to address attentiongoals.  Pt sustained his attention to task for ~3 minutes with mod verbal cues for redirection.  Pt was left in  enclosure bed with call bell within reach.  Pt needed max assist to return demonstration of use of call bell.  Continue per current plan of care.    Function:  Eating Eating   Modified Consistency  Diet: Yes Eating Assist Level: Supervision or verbal cues;Helper checks for pocketed food           Cognition Comprehension Comprehension assist level: Understands basic 50 - 74% of the time/ requires cueing 25 - 49% of the time  Expression   Expression assist level: Expresses basic 50 - 74% of the time/requires cueing 25 - 49% of the time. Needs to repeat parts of sentences.  Social Interaction Social Interaction assist level: Interacts appropriately 50 - 74% of the time - May be physically or verbally inappropriate.  Problem Solving Problem solving assist level: Solves basic less than 25% of the time - needs direction nearly all the time or does not effectively solve problems and may need a restraint for safety  Memory Memory assist level: Recognizes or recalls less than 25% of the time/requires cueing greater than 75% of the time    Pain Pain Assessment Pain Assessment: No/denies pain  Therapy/Group: Individual Therapy  Aine Strycharz, [name] 03/04/2017, 4:12 PM

## 2017-03-05 ENCOUNTER — Inpatient Hospital Stay (HOSPITAL_COMMUNITY): Payer: Self-pay | Admitting: Speech Pathology

## 2017-03-05 ENCOUNTER — Inpatient Hospital Stay (HOSPITAL_COMMUNITY): Payer: Self-pay | Admitting: Occupational Therapy

## 2017-03-05 ENCOUNTER — Inpatient Hospital Stay (HOSPITAL_COMMUNITY): Payer: Self-pay

## 2017-03-05 LAB — GLUCOSE, CAPILLARY
GLUCOSE-CAPILLARY: 135 mg/dL — AB (ref 65–99)
GLUCOSE-CAPILLARY: 114 mg/dL — AB (ref 65–99)
GLUCOSE-CAPILLARY: 115 mg/dL — AB (ref 65–99)
GLUCOSE-CAPILLARY: 149 mg/dL — AB (ref 65–99)

## 2017-03-05 NOTE — Plan of Care (Signed)
Problem: RH Balance Goal: LTG Patient will maintain dynamic standing balance (PT) LTG:  Patient will maintain dynamic standing balance with assistance during mobility activities (PT)  Upgraded due to progress ABG  Problem: RH Ambulation Goal: LTG Patient will ambulate in controlled environment (PT) LTG: Patient will ambulate in a controlled environment, # of feet with assistance (PT).  Upgraded due to progress ABG  Problem: RH Stairs Goal: LTG Patient will ambulate up and down stairs w/assist (PT) LTG: Patient will ambulate up and down # of stairs with assistance (PT)  Upgraded due to progress ABG

## 2017-03-05 NOTE — Progress Notes (Signed)
Speech Language Pathology Daily Session Note  Patient Details  Name: Jamie BeckwithMiguel Garcia Berg MRN: [id number] Date of Birth: 1966-09-11  Today's Date: 03/05/2017 SLP Individual Time: [id number] SLP Individual Time Calculation (min): 40 min  Short Term Goals: Week 5: SLP Short Term Goal 1 (Week 5): Patient will consume current diet with minimal overt s/s of aspiration with supervision verbal cues for use of swallowing compensatory strategies.  SLP Short Term Goal 2 (Week 5): Patient will demonstrate efficient mastication with mild oral residue and minimal overt s/s of aspiration with Mod A verbal cues over 2 sessions prior to upgrade.  SLP Short Term Goal 3 (Week 5): Patient will demonstrate sustained attention to a functional task for ~10 minutes with Mod A verbal cues for redirection.  SLP Short Term Goal 4 (Week 5): Patient will orient to place, situation and time with Mod  A multimodal cues.  SLP Short Term Goal 5 (Week 5): Patient will demonstrate basic problem solving for basic tasks with Max A verbal cues.   Skilled Therapeutic Interventions:  Pt was seen for skilled ST targeting cognitive goals.  Pt required max assist multimodal cues to utilize external aids posted in his room to orient to place and situation.  SLP facilitated the session with a pipe tree task targeting attention and problem solving.  Pt was able to sustain his attention to task for ~10 minutes with mod verbal cues for redirection but needed max assist multimodal cues for problem solving and error awareness due to impulsivity.  Max assist question cues were needed for wayfinding with the use of environmental aids when going back to pt's room at the end of today's therapy session.  Pt left in enclosure bed with sister at bedside.  Continue per current plan of care.       Function:  Eating Eating                 Cognition Comprehension Comprehension assist level: Understands basic 50 - 74% of the time/ requires  cueing 25 - 49% of the time  Expression Expression assistive device: Other (Comment) (interpretor) Expression assist level: Expresses basic 50 - 74% of the time/requires cueing 25 - 49% of the time. Needs to repeat parts of sentences.  Social Interaction Social Interaction assist level: Interacts appropriately 50 - 74% of the time - May be physically or verbally inappropriate.  Problem Solving Problem solving assist level: Solves basic 25 - 49% of the time - needs direction more than half the time to initiate, plan or complete simple activities  Memory Memory assist level: Recognizes or recalls 25 - 49% of the time/requires cueing 50 - 75% of the time    Pain Pain Assessment Pain Assessment: No/denies pain  Therapy/Group: Individual Therapy  Thatcher Doberstein, [name] 03/05/2017, 4:12 PM

## 2017-03-05 NOTE — Progress Notes (Addendum)
Physical Therapy Session Note  Patient Details  Name: [name] MRN: [id number] Date of Birth: [birth year]  Today's Date: 03/05/2017 PT Individual Time: 1100-1200 PT Individual Time Calculation (min): 60 min   Skilled Therapeutic Interventions/Progress Updates:    Session focused on family education with pt and pt's sister, [name] with focus on hands on practice for transfers, bed mobility on regular bed in ADL apartment, w/c parts management, and education in regards to TBI education, recovery, safety, and d/c planning and problem solving home environment access and set-up. [name] able to complete basic transfers from various surfaces including out of vail bed <> w/c, recliner <> w/c, and simulated SUV <> w/c providing overall min assist with verbal cues. Educated on use of simple commands and demonstrational cues. Successful with pt holding sister's cell phone in L hand to decrease pt's tendency to grip onto something and be unable to let go during mobility. Handout given with list of items and home set-up to take pictures of and measure to increase ability for accessibility and practice prior to d/c. Discussed home entry (long pathway and no stairs) or another entrance with 10 steps without rails. Recommending pushing pt in w/c on pathway to entrance without stairs at this time. Pt has shower on second level with powder room on first floor and plan for bedroom to be set up downstairs.   Pt able to demonstrate improved functional use of LUE during bimanual tasks automatically though continues to have issues with letting objects go at times. Pt able to don and doff shoes x 3 during session as appropriate (once out of bed initially, then on practice bed in apartment, and again at end of session when returning to enclosure bed) with overall supervision with intermittent cues for which shoe was with which foot and technique. Demonstrates dynamic standing balance with min assist throughout  session.   PT focused on neuro re-ed during gait, stairs, and on Nustep to address reciprocal movement pattern retraining, coordination, postural control, and balance. Pt able to attend to Nustep activity with target goal of 10 min (made it to 9 min and 30 seconds before losing attention due to fatigue). Resistance started at 5 and decreased to 3 at 5 min due to fatigue (pt used LUE the whole time). Pt requires min progressing to mod assist on stairs when decreased attention to foot placement causing pt to mis-step and attempting to move to fast with cues for slower movement on stairs. Would not recommend stairs with family at this time from a safety standpoint and discussed this with sister. Gait with overall min assist except up to mod assist during turns on unit without AD with improved step length noted on L today.   Therapy Documentation Precautions:  Precautions Precautions: Fall, Other (comment) Precaution Comments: bone flap removed R side of head in abdomen; hemet for OOB; Pt no longer with NG or PICC; enclosure bed Required Braces or Orthoses: Other Brace/Splint Other Brace/Splint: helmet for OOB Restrictions Weight Bearing Restrictions: No Pain: Pain Assessment Pain Assessment: No/denies pain   See Function Navigator for Current Functional Status.   Therapy/Group: Individual Therapy  Karolee StampsGray, Ikenna Ohms Darrol PokeBrescia  Milina Pagett B. Davie Claud, PT, DPT  03/05/2017, 12:08 PM

## 2017-03-05 NOTE — Progress Notes (Signed)
Occupational Therapy Session Note  Patient Details  Name: [name] MRN: [id number] Date of Birth: 1966-08-19  Today's Date: 03/05/2017 OT Individual Time: 1191-4782 and 1002-1059 and 1353-1437 OT Individual Time Calculation (min): 63 min and 57 min and 44 min    Short Term Goals: Week 4:  OT Short Term Goal 1 (Week 4): Pt will transfer to toilet with min A with family member OT Short Term Goal 2 (Week 4): Pt will demonstrate intellecual awarenss with mod A (of at least two things) OT Short Term Goal 3 (Week 4): Pt will transfer into tub shower without grab bars with LRAD with min A  OT Short Term Goal 4 (Week 4): Pt will be oriented x3 with external aids with mod A  Skilled Therapeutic Interventions/Progress Updates:  Tx focus on functional ambulation, cognitive remediation, and ADL retraining.   Pt lying in enclosure bed at time of arrival with interpretor present. Pt ambulated to shower with Mod A HHA (and helmet donned), pt reaching out with L UE for furniture. Placed item in left hand while ambulating to address this during session. Pt initiated clothing removal prior to shower. Before removing gripper socks, pt initiated ambulation to toilet for voiding bladder. Pt still requires instructional cues to attend to LEs and buttocks when bathing. Afterwards pt ambulated to dresser to select clothing items. Pt initially picking out 2 shirts and was able to correct with questioning cues. Pt dressed in nearby chair. Max instructional cues required for properly orienting underwear/pants. Pt attempting to don Ted stockings over gripper socks, instructional cues to correct. Pt then ambulated to w/c and was left with nurse tech for eating breakfast.   2nd Session 1:1 tx (57 min) Tx focus on functional ambulation, cognitive remediation, and endurance.   Pt lying in enclosure bed at time of arrival with interpretor present. Pt ambulated with HHA Mod A (and deodorant stick in L hand) to  therapy apartment with close w/c follow. In apartment, had pt restock pantry/cupboards with boxed food items. Pt able to read items and appropriately indicate which time of day the food was typically eaten (I.e. Cereal bars.. During breakfast or dinner). Pt able to name fruit with 100% accuracy (and able to identify that all fruit examples were fake.) Mod cues for locating fridge, oven, coffee pot, and dish washer. Afterwards instructed pt to ambulate back to room. When heading out of apartment, pt ambulated towards right and would not stop, continued into ortho gym and then sat back in w/c. Pt reported feeling fatigued. He was escorted back to room and transferred back to enclosure bed. Pt handoff to PT.     3rd Session 1:1 tx (44 min) Tx focus on caregiver education and functional transfers.   Pt greeted lying in enclosure bed. Interpretor and sister Karna Christmas present. [name] agreeable to transfer training. She was provided with verbal instruction and visual demonstrations regarding proper technique of toilet and tub bench tranfers. She then had hands on practice assisting pt in transferring. Discussed using tactile cues and 1-2 word commands for ease of comprehension. [name] exhibited difficultly with redirecting pt when he began acting impulsively (I.e. Transferring off of toilet with pants down or trying to side step out of tub). OT intervened in these situations for safety. She continues to benefit from transfer training. Also discussed DME needs of tub transfer bench and BSC to place over toilet for home. At end of tx pt was escorted back to room in w/c and transferred back to  bed. He was left in secure enclosure bed with sister present at time of departure.    Therapy Documentation Precautions:  Precautions Precautions: Fall, Other (comment) Precaution Comments: bone flap removed R side of head in abdomen; hemet for OOB; Pt no longer with NG or PICC; enclosure bed Required Braces or Orthoses:  Other Brace/Splint Other Brace/Splint: helmet for OOB Restrictions Weight Bearing Restrictions: No   Pain: No c/o pain during tx  Pain Assessment Pain Assessment: No/denies pain ADL:      See Function Navigator for Current Functional Status.   Therapy/Group: Individual Therapy  Octavion Mollenkopf A Keila Turan 03/05/2017, 12:32 PM

## 2017-03-05 NOTE — Progress Notes (Addendum)
[facility]ILIT[facility]     PROGRESS NOTE    Subjective/Complaints: No complaints. Lying in bed waiting for breakfast.   ROS: pt denies nausea, vomiting, diarrhea, cough, shortness of breath or chest pain        Objective: Vital Signs: Blood pressure 105/72, pulse 85, temperature 98 F (36.7 C), temperature source Oral, resp. rate 18, height 5\' 2"  (1.575 m), weight 61.1 kg (134 lb 11.2 oz), SpO2 100 %. No results found. No results for input(s): WBC, HGB, HCT, PLT in the last 72 hours. No results for input(s): NA, K, CL, GLUCOSE, BUN, CREATININE, CALCIUM in the last 72 hours.  Invalid input(s): CO CBG (last 3)   Recent Labs  03/04/17 2049 03/04/17 2149 03/05/17 0629  GLUCAP 121* 86 135*    Wt Readings from Last 3 Encounters:  03/03/17 61.1 kg (134 lb 11.2 oz)  02/01/17 65.6 kg (144 lb 9.6 oz)  07/31/16 68 kg (150 lb)    Physical Exam:    Gen: NAD. Well developed.  HENT:  Craniectomy scar  Cardiovascular:   RRR Respiratory:  CTA B GI: Soft. Bowel sounds are normal. Neurological:   Engages left side with cueing. Moves all 4's.  More attentive Psychiatric:   Flat but cooperative, pleasant Skin. Warm and dry. Crani wound closing nicely. No drainage. Areas of scab which are decreasing.   Assessment/Plan: 1. Cognitive and functional deficits secondary to right traumatic ICH/SDH s/p craniectomy which require 3+ hours per day of interdisciplinary therapy in a comprehensive inpatient rehab setting. Physiatrist is providing close team supervision and 24 hour management of active medical problems listed below. Physiatrist and rehab team continue to assess barriers to discharge/monitor patient progress toward functional and medical goals.  Function:  Bathing Bathing position   Position: Shower  Bathing parts Body parts bathed by patient: Right arm, Left arm, Chest, Abdomen, Right upper leg, Left upper leg, Right lower leg, Left lower leg, Front  perineal area, Buttocks Body parts bathed by helper: Back  Bathing assist Assist Level: Touching or steadying assistance(Pt > 75%)      Upper Body Dressing/Undressing Upper body dressing   What is the patient wearing?: Pull over shirt/dress     Pull over shirt/dress - Perfomed by patient: Thread/unthread right sleeve, Thread/unthread left sleeve, Put head through opening, Pull shirt over trunk Pull over shirt/dress - Perfomed by helper: Pull shirt over trunk        Upper body assist Assist Level: Supervision or verbal cues   Set up : To obtain clothing/put away  Lower Body Dressing/Undressing Lower body dressing   What is the patient wearing?: Underwear, Pants, Non-skid slipper socks Underwear - Performed by patient: Thread/unthread right underwear leg, Thread/unthread left underwear leg, Pull underwear up/down   Pants- Performed by patient: Thread/unthread right pants leg, Thread/unthread left pants leg, Pull pants up/down Pants- Performed by helper: Pull pants up/down Non-skid slipper socks- Performed by patient: Don/doff left sock, Don/doff right sock Non-skid slipper socks- Performed by helper: Don/doff right sock, Don/doff left sock Socks - Performed by patient: Don/doff right sock, Don/doff left sock Socks - Performed by helper: Don/doff right sock, Don/doff left sock Shoes - Performed by patient: Don/doff right shoe, Don/doff left shoe, Fasten right, Fasten left Shoes - Performed by helper: Fasten right, Fasten left     TED Hose - Performed by patient: Don/doff right TED hose TED Hose - Performed by helper: Don/doff left TED hose  Lower body assist Assist for lower body dressing:  Touching or steadying assistance (Pt > 75%)      Toileting Toileting Toileting activity did not occur: Safety/medical concerns Toileting steps completed by patient: Adjust clothing prior to toileting, Performs perineal hygiene Toileting steps completed by helper: Adjust clothing prior to  toileting, Adjust clothing after toileting Toileting Assistive Devices: Grab bar or rail  Toileting assist Assist level: Touching or steadying assistance (Pt.75%)   Transfers Chair/bed transfer Chair/bed transfer activity did not occur:  (standard chair to w/c) Chair/bed transfer method: Stand pivot Chair/bed transfer assist level: Touching or steadying assistance (Pt > 75%) Chair/bed transfer assistive device: Armrests Mechanical lift: Engineer, petroleum Ambulation activity did not occur: Safety/medical concerns   Max distance: 150 ft Assist level: Moderate assist (Pt 50 - 74%)   Wheelchair Wheelchair activity did not occur: Safety/medical concerns   Max wheelchair distance: 10 Assist Level: Maximal assistance (Pt 25 - 49%)  Cognition Comprehension Comprehension assist level: Understands basic 50 - 74% of the time/ requires cueing 25 - 49% of the time  Expression Expression assist level: Expresses basic 50 - 74% of the time/requires cueing 25 - 49% of the time. Needs to repeat parts of sentences.  Social Interaction Social Interaction assist level: Interacts appropriately 50 - 74% of the time - May be physically or verbally inappropriate.  Problem Solving Problem solving assist level: Solves basic less than 25% of the time - needs direction nearly all the time or does not effectively solve problems and may need a restraint for safety  Memory Memory assist level: Recognizes or recalls less than 25% of the time/requires cueing greater than 75% of the time  Medical Problem List and Plan: 1. 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 DVT Prophylaxis/Anticoagulation: Subcutaneous heparin. Monitor platelet counts and any signs of bleeding. 3. Pain Management: Hycet  4. Mood:  Ativan as needed 5. Neuropsych: This patient is notcapable of making decisions on hisown behalf.  -HS seroquel for sleep/agitation, reduce to 25mg    -limiting benzos  -continue ritalin for arousal, attention and initiation- titrate to 15mg  bid  -enclosure bed for safety still required-renewed  -soft helmet for safety 6. Skin/Wound Care: Routine skin checks  -helmet for cranium protection  -flap site intact  -crani incision much improved---  7. Fluids/Electrolytes/Nutrition:    -intake reasonable 8.Seizure prophylaxis. Keppra 500 mg twice daily. Hesitate to decrease further given craniectomy 9.Pneumonia. resolved 10.Dysphagia. On D1 thins. Taking meds without issues   11.Urinary retention/recent Escherichia coli UTI s/p course of Cipro.   - repeat urine culture with 100k coag neg staph--- macrobid completed  -continue urecholine- 50 mg TID  -timed voids. Pt beginning to empty again, cath prn  -suspect cognitive/apraxic factors to emptying currently---continue with plan  12.Diabetes mellitus with peripheral neuropathy. Hemoglobin A1c 10.4.  Patient on Glucophage 500 mg twice a day and Glucotrol 5 mg daily at home  -reasonable control at present  -lantus currently at 15u qhs 13.History of hypertension. Patient on lisinopril 2.5 mg daily prior to admission. Resume if >130/85 Blood pressure currently low/stable    Vitals:   03/04/17 1621 03/05/17 0356  BP: 101/86 105/72  Pulse: 81 85  Resp: 18 18  Temp:  98 F (36.7 C)   14.Constipation. Laxative assistance     LOS (Days) 32 A FACE TO FACE EVALUATION WAS PERFORMED  [name], MD 03/05/2017 8:45 AM

## 2017-03-06 ENCOUNTER — Inpatient Hospital Stay (HOSPITAL_COMMUNITY): Payer: Self-pay | Admitting: Occupational Therapy

## 2017-03-06 LAB — GLUCOSE, CAPILLARY
GLUCOSE-CAPILLARY: 122 mg/dL — AB (ref 65–99)
GLUCOSE-CAPILLARY: 105 mg/dL — AB (ref 65–99)
Glucose-Capillary: 110 mg/dL — ABNORMAL HIGH (ref 65–99)
Glucose-Capillary: 143 mg/dL — ABNORMAL HIGH (ref 65–99)

## 2017-03-06 NOTE — Progress Notes (Addendum)
Occupational Therapy Session Note  Patient Details  Name: Jamie BeckwithMiguel Garcia Cardenas MRN: [id number] Date of Birth: [birth year]  Today's Date: 03/06/2017 OT Individual Time: 1012-1059 OT Individual Time Calculation (min): 47 min   Skilled Therapeutic Interventions/Progress Updates:  Tx focus on ADL retraining, cognitive remediation, and functional ambulation without device.   Pt greeted in enclosure bed with family present. Pt unable to independently verbalize what he would like to do during tx without provided options. With helmet donned, pt ambulated with Mod A HHA to bathroom (object in L UE to prevent reaching). With extra time, pt needing mod tactile cues to initiate clothing removal and method for starting shower. Pt required instructional cues for attending to R LE and buttocks. Min A all sit<stands. Extra time provided for self sequencing/organizing. Afterwards pt ambulated to [employer]. He retrieved all necessary clothing items with min vcs and proceeded to dress in nearby chair. Max cues for problem solving for correctly orienting clothing items. On two instances, pt stopped during dressing and ambulated back to bed to transition to supine. Sister Karna Christmasdrianna was instructed on methods for redirecting pt in times like this by using gentle tactile cues and short instructions while encouraging pt problem solving. [name] able to verbalize 4 therapeutic methods OT used during session to carryover at home when caregiving. Pt exhibiting apraxia while attempting to use shaving cream as toothpaste during oral care. At end of tx pt was left in secure enclosure bed with all needs within reach and family present.   Interpretor present throughout session.      Therapy Documentation Precautions:  Precautions Precautions: Fall, Other (comment) Precaution Comments: bone flap removed R side of head in abdomen; hemet for OOB; Pt no longer with NG or PICC; enclosure bed Required Braces or Orthoses: Other  Brace/Splint Other Brace/Splint: helmet for OOB Restrictions Weight Bearing Restrictions: No   Pain: No c/o pain during tx  Pain Assessment Pain Assessment: 0-10 Pain Score: 9  Pain Type: Acute pain Pain Location: Abdomen Pain Orientation: Right Pain Descriptors / Indicators: Sore Pain Onset: Gradual Pain Intervention(s): Medication (See eMAR) ADL:      See Function Navigator for Current Functional Status.   Therapy/Group: Individual Therapy  Fronia Depass A Kharma Sampsel 03/06/2017, 12:26 PM

## 2017-03-06 NOTE — Progress Notes (Signed)
[facility] MEDIC[facility] & RE[facility]ABILITATION     PROGRESS NOTE    Subjective/Complaints: Patient seen lying in bed this morning. No reported issues overnight. Per nursing, patient's friends came over yesterday and noticed improvement. No interpreter present.  ROS: Limited by language, but appears to deny CP, SOB, nausea, vomiting, diarrhea.  Objective: Vital Signs: Blood pressure 119/76, pulse 86, temperature 97.8 F (36.6 C), temperature source Oral, resp. rate 17, height 5\' 2"  (1.575 m), weight 61.1 kg (134 lb 11.2 oz), SpO2 96 %. No results found. No results for input(s): WBC, HGB, HCT, PLT in the last 72 hours. No results for input(s): NA, K, CL, GLUCOSE, BUN, CREATININE, CALCIUM in the last 72 hours.  Invalid input(s): CO CBG (last 3)   Recent Labs  03/05/17 2044 03/06/17 0619 03/06/17 1133  GLUCAP 149* 110* 122*    Wt Readings from Last 3 Encounters:  03/03/17 61.1 kg (134 lb 11.2 oz)  02/01/17 65.6 kg (144 lb 9.6 oz)  07/31/16 68 kg (150 lb)    Physical Exam:    Gen: NAD. Well developed.  HENT:  Craniectomy scar  Cardiovascular:   RRR. No JVD Respiratory:  CTA B. Unlabored GI: Soft. Bowel sounds are normal. Neurological:   Spontaneously moving all 4 extremities Psychiatric:   Flat but cooperative, pleasant Skin. Warm and dry. Crani wound healing. No drainage. Areas of scab which are decreasing.   Assessment/Plan: 1. Cognitive and functional deficits secondary to right traumatic ICH/SDH s/p craniectomy which require 3+ hours per day of interdisciplinary therapy in a comprehensive inpatient rehab setting. Physiatrist is providing close team supervision and 24 hour management of active medical problems listed below. Physiatrist and rehab team continue to assess barriers to discharge/monitor patient progress toward functional and medical goals.  Function:  Bathing Bathing position   Position: Shower  Bathing parts Body parts bathed by patient: Right arm,  Left arm, Chest, Abdomen, Right upper leg, Left upper leg, Right lower leg, Left lower leg, Front perineal area, Buttocks Body parts bathed by helper: Back  Bathing assist Assist Level: Touching or steadying assistance(Pt > 75%)      Upper Body Dressing/Undressing Upper body dressing   What is the patient wearing?: Pull over shirt/dress     Pull over shirt/dress - Perfomed by patient: Thread/unthread right sleeve, Thread/unthread left sleeve, Put head through opening, Pull shirt over trunk Pull over shirt/dress - Perfomed by helper: Pull shirt over trunk        Upper body assist Assist Level: Supervision or verbal cues   Set up : To obtain clothing/put away  Lower Body Dressing/Undressing Lower body dressing   What is the patient wearing?: Underwear, Pants, Shoes, [employer] Insuranceed Hose, Non-skid slipper socks Underwear - Performed by patient: Thread/unthread right underwear leg, Thread/unthread left underwear leg, Pull underwear up/down   Pants- Performed by patient: Thread/unthread right pants leg, Thread/unthread left pants leg, Pull pants up/down Pants- Performed by helper: Pull pants up/down Non-skid slipper socks- Performed by patient: Don/doff left sock, Don/doff right sock Non-skid slipper socks- Performed by helper: Don/doff right sock, Don/doff left sock Socks - Performed by patient: Don/doff right sock, Don/doff left sock Socks - Performed by helper: Don/doff right sock, Don/doff left sock Shoes - Performed by patient: Don/doff right shoe, Don/doff left shoe, Fasten right, Fasten left Shoes - Performed by helper: Fasten right, Fasten left     TED Hose - Performed by patient: Don/doff right TED hose TED Hose - Performed by helper: Don/doff right TED hose, Don/doff  left TED hose  Lower body assist Assist for lower body dressing: Touching or steadying assistance (Pt > 75%)      Toileting Toileting Toileting activity did not occur: Safety/medical concerns Toileting steps completed by  patient: Adjust clothing prior to toileting, Adjust clothing after toileting Toileting steps completed by helper: Performs perineal hygiene Toileting Assistive Devices: Grab bar or rail  Toileting assist Assist level: Touching or steadying assistance (Pt.75%)   Transfers Chair/bed transfer Chair/bed transfer activity did not occur:  (standard chair to w/c) Chair/bed transfer method: Stand pivot, Ambulatory Chair/bed transfer assist level: Touching or steadying assistance (Pt > 75%) Chair/bed transfer assistive device: Armrests Mechanical lift: Engineer, petroleum Ambulation activity did not occur: Safety/medical concerns   Max distance: 150 ft Assist level: Moderate assist (Pt 50 - 74%)   Wheelchair Wheelchair activity did not occur: Safety/medical concerns   Max wheelchair distance: 10 Assist Level: Dependent (Pt equals 0%)  Cognition Comprehension Comprehension assist level: Understands basic 50 - 74% of the time/ requires cueing 25 - 49% of the time  Expression Expression assist level: Expresses basic 50 - 74% of the time/requires cueing 25 - 49% of the time. Needs to repeat parts of sentences.  Social Interaction Social Interaction assist level: Interacts appropriately 50 - 74% of the time - May be physically or verbally inappropriate.  Problem Solving Problem solving assist level: Solves basic 25 - 49% of the time - needs direction more than half the time to initiate, plan or complete simple activities  Memory Memory assist level: Recognizes or recalls 25 - 49% of the time/requires cueing 50 - 75% of the time  Medical Problem List and Plan: 1. 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 DVT Prophylaxis/Anticoagulation: Subcutaneous heparin. Monitor platelet counts and any  signs of bleeding. 3. Pain Management: Hycet  4. Mood: Ativan as needed 5. Neuropsych: This patient is notcapable of making decisions on hisown behalf.  -HS seroquel for sleep/agitation, reduce to 25mg    -limiting benzos  -continue ritalin for arousal, attention and initiation- titrate to 15mg  bid  -enclosure bed for safety still required, renewed  -soft helmet for safety 6. Skin/Wound Care: Routine skin checks  -helmet for cranium protection  -flap site intact  -crani incision much improved---  7. Fluids/Electrolytes/Nutrition:    -intake reasonable 8.Seizure prophylaxis. Keppra 500 mg twice daily. Hesitate to decrease further given craniectomy 9.Pneumonia. resolved 10.Dysphagia. On D1 thins. Taking meds without issues   11.Urinary retention/recent Escherichia coli UTI s/p course of Cipro.   - repeat urine culture with 100k coag neg staph--- macrobid completed  -continue urecholine- 50 mg TID  -timed voids. Pt beginning to empty again, cath prn  -suspect cognitive/apraxic factors to emptying currently---continue with plan  12.Diabetes mellitus with peripheral neuropathy. Hemoglobin A1c 10.4.  Patient on Glucophage 500 mg twice a day and Glucotrol 5 mg daily at home  -lantus 15u qhs, glipizide 5 mg  Relatively controlled 5/5 13.History of hypertension. Patient on lisinopril 2.5 mg daily prior to admission. Resume if >130/85  Controlled 5/5   Vitals:   03/05/17 1330 03/06/17 0621  BP: 110/71 119/76  Pulse: 80 86  Resp: 18 17  Temp: 98.2 F (36.8 C) 97.8 F (36.6 C)   14.Constipation. Laxative assistance     LOS (Days) 33 A FACE TO FACE EVALUATION WAS PERFORMED  Jory Welke Karis Juba, MD 03/06/2017 2:01 PM

## 2017-03-07 ENCOUNTER — Inpatient Hospital Stay (HOSPITAL_COMMUNITY): Payer: Self-pay | Admitting: Occupational Therapy

## 2017-03-07 LAB — GLUCOSE, CAPILLARY
GLUCOSE-CAPILLARY: 133 mg/dL — AB (ref 65–99)
GLUCOSE-CAPILLARY: 134 mg/dL — AB (ref 65–99)
GLUCOSE-CAPILLARY: 124 mg/dL — AB (ref 65–99)
Glucose-Capillary: 130 mg/dL — ABNORMAL HIGH (ref 65–99)

## 2017-03-07 NOTE — Progress Notes (Signed)
[place] [facility]ICINE & REHABILITATION     PROGRESS NOTE    Subjective/Complaints: Pt seen laying in bed this AM. No interpretor present. Appears to indicate he slept well.   ROS: Limited by language, but appears to deny CP, SOB, nausea, vomiting, diarrhea.  Objective: Vital Signs: Blood pressure 119/79, pulse 86, temperature 98.1 F (36.7 C), temperature source Oral, resp. rate 18, height 5\' 2"  (1.575 m), weight 60.4 kg (133 lb 1.6 oz), SpO2 95 %. No results found. No results for input(s): WBC, HGB, HCT, PLT in the last 72 hours. No results for input(s): NA, K, CL, GLUCOSE, BUN, CREATININE, CALCIUM in the last 72 hours.  Invalid input(s): CO CBG (last 3)   Recent Labs  03/06/17 1645 03/06/17 2049 03/07/17 0601  GLUCAP 105* 143* 130*    Wt Readings from Last 3 Encounters:  03/07/17 60.4 kg (133 lb 1.6 oz)  02/01/17 65.6 kg (144 lb 9.6 oz)  07/31/16 68 kg (150 lb)    Physical Exam:    Gen: NAD. Well developed.  HENT:  Craniectomy scar  Cardiovascular:   RRR. No JVD Respiratory:  CTA B. Unlabored GI: Soft. Bowel sounds are normal. Neurological:   Spontaneously moving all 4 extremities Psychiatric:   Flat but cooperative, pleasant Skin. Warm and dry. Crani wound healing. No drainage. Areas of scab which are decreasing.   Assessment/Plan: 1. Cognitive and functional deficits secondary to right traumatic ICH/SDH s/p craniectomy which require 3+ hours per day of interdisciplinary therapy in a comprehensive inpatient rehab setting. Physiatrist is providing close team supervision and 24 hour management of active medical problems listed below. Physiatrist and rehab team continue to assess barriers to discharge/monitor patient progress toward functional and medical goals.  Function:  Bathing Bathing position   Position: Shower  Bathing parts Body parts bathed by patient: Right arm, Left arm, Chest, Abdomen, Right upper leg, Left upper leg, Right lower leg, Left  lower leg, Front perineal area, Buttocks Body parts bathed by helper: Back  Bathing assist Assist Level: Touching or steadying assistance(Pt > 75%)      Upper Body Dressing/Undressing Upper body dressing   What is the patient wearing?: Pull over shirt/dress     Pull over shirt/dress - Perfomed by patient: Thread/unthread right sleeve, Thread/unthread left sleeve, Put head through opening, Pull shirt over trunk Pull over shirt/dress - Perfomed by helper: Pull shirt over trunk        Upper body assist Assist Level: Supervision or verbal cues   Set up : To obtain clothing/put away  Lower Body Dressing/Undressing Lower body dressing   What is the patient wearing?: Underwear, Pants, Shoes, [employer] Hose, Non-skid slipper socks Underwear - Performed by patient: Thread/unthread right underwear leg, Thread/unthread left underwear leg, Pull underwear up/down   Pants- Performed by patient: Thread/unthread right pants leg, Thread/unthread left pants leg, Pull pants up/down Pants- Performed by helper: Pull pants up/down Non-skid slipper socks- Performed by patient: Don/doff left sock, Don/doff right sock Non-skid slipper socks- Performed by helper: Don/doff right sock, Don/doff left sock Socks - Performed by patient: Don/doff right sock, Don/doff left sock Socks - Performed by helper: Don/doff right sock, Don/doff left sock Shoes - Performed by patient: Don/doff right shoe, Don/doff left shoe, Fasten right, Fasten left Shoes - Performed by helper: Fasten right, Fasten left     TED Hose - Performed by patient: Don/doff right TED hose TED Hose - Performed by helper: Don/doff right TED hose, Don/doff left TED hose  Lower body assist  Assist for lower body dressing: Touching or steadying assistance (Pt > 75%)      Toileting Toileting Toileting activity did not occur: Safety/medical concerns Toileting steps completed by patient: Adjust clothing prior to toileting, Performs perineal hygiene, Adjust  clothing after toileting Toileting steps completed by helper: Performs perineal hygiene Toileting Assistive Devices: Grab bar or rail  Toileting assist Assist level: Touching or steadying assistance (Pt.75%)   Transfers Chair/bed transfer Chair/bed transfer activity did not occur:  (standard chair to w/c) Chair/bed transfer method: Stand pivot, Ambulatory Chair/bed transfer assist level: Touching or steadying assistance (Pt > 75%) Chair/bed transfer assistive device: Armrests Mechanical lift: Engineer, petroleum Ambulation activity did not occur: Safety/medical concerns   Max distance: 150 ft Assist level: Moderate assist (Pt 50 - 74%)   Wheelchair Wheelchair activity did not occur: Safety/medical concerns   Max wheelchair distance: 10 Assist Level: Dependent (Pt equals 0%)  Cognition Comprehension Comprehension assist level: Understands basic 50 - 74% of the time/ requires cueing 25 - 49% of the time  Expression Expression assist level: Expresses basic 50 - 74% of the time/requires cueing 25 - 49% of the time. Needs to repeat parts of sentences.  Social Interaction Social Interaction assist level: Interacts appropriately 50 - 74% of the time - May be physically or verbally inappropriate.  Problem Solving Problem solving assist level: Solves basic 25 - 49% of the time - needs direction more than half the time to initiate, plan or complete simple activities  Memory Memory assist level: Recognizes or recalls 25 - 49% of the time/requires cueing 50 - 75% of the time  Medical Problem List and Plan: 1. 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 DVT Prophylaxis/Anticoagulation: Subcutaneous heparin. Monitor platelet counts and any signs of bleeding. 3. Pain Management: Hycet  4. Mood:  Ativan as needed 5. Neuropsych: This patient is notcapable of making decisions on hisown behalf.  -HS seroquel for sleep/agitation, reduce to 25mg    -limiting benzos  -continue ritalin for arousal, attention and initiation- titrate to 15mg  bid  -enclosure bed for safety still required, renewed.  Will consider d/cing veil bed this week  -soft helmet for safety 6. Skin/Wound Care: Routine skin checks  -helmet for cranium protection  -flap site intact  -crani incision much improved---  7. Fluids/Electrolytes/Nutrition:    -intake reasonable  Labs ordered for tomorrow 8.Seizure prophylaxis. Keppra 500 mg twice daily. Hesitate to decrease further given craniectomy 9.Pneumonia. resolved 10.Dysphagia. On D1 thins. Taking meds without issues 11.Urinary retention/recent Escherichia coli UTI s/p course of Cipro.   - repeat urine culture with 100k coag neg staph--- macrobid completed  -continue urecholine- 50 mg TID  -timed voids. Pt beginning to empty again, cath prn  -suspect cognitive/apraxic factors to emptying currently---continue with plan  12.Diabetes mellitus with peripheral neuropathy. Hemoglobin A1c 10.4.  Patient on Glucophage 500 mg twice a day and Glucotrol 5 mg daily at home  -lantus 15u qhs, glipizide 5 mg  Relatively controlled 5/6 13.History of hypertension. Patient on lisinopril 2.5 mg daily prior to admission. Resume if >130/85  Controlled 5/6   Vitals:   03/06/17 1408 03/07/17 0600  BP: 119/76 119/79  Pulse: 85 86  Resp: 18 18  Temp: 97.9 F (36.6 C) 98.1 F (36.7 C)   14.Constipation. Laxative assistance     LOS (Days) 34 A  FACE TO FACE EVALUATION WAS PERFORMED  Jamie Mccullum Karis Juba, MD 03/07/2017 8:22 AM

## 2017-03-07 NOTE — Plan of Care (Signed)
Problem: RH SAFETY Goal: RH STG DECREASED RISK OF FALL WITH ASSISTANCE STG Decreased Risk of Fall With Max Assistance.   Outcome: Progressing Safety precautions maintained, no fall or injury this shift  Problem: RH PAIN MANAGEMENT Goal: RH STG PAIN MANAGED AT OR BELOW PT'S PAIN GOAL <2  Outcome: Progressing Denies pain

## 2017-03-07 NOTE — Progress Notes (Signed)
Occupational Therapy Session Note  Patient Details  Name: [name] MRN: [id number] Date of Birth: October 07, 1966  Today's Date: 03/07/2017 OT Individual Time: 1105-1210 OT Individual Time Calculation (min): 65 min    Short Term Goals: Week 3:  OT Short Term Goal 1 (Week 3): Pt will perform toileting with max A in order to decrease assistance with functional task.  OT Short Term Goal 1 - Progress (Week 3): Met OT Short Term Goal 2 (Week 3): Pt will utilize L UE in functional tasks with mod multimodal cues. OT Short Term Goal 2 - Progress (Week 3): Met OT Short Term Goal 3 (Week 3): Pt will initiate sit <>stand for LB clothing management with mod A.  OT Short Term Goal 3 - Progress (Week 3): Met  Skilled Therapeutic Interventions/Progress Updates:  Tx focus on ADL retraining, cognitive remediation, and functional ambulation without device.   Pt greeted in enclosure bed at time of arrival, agreeable to shower. He ambulated to bathroom with Mod A HHA and helmet donned, stopping at toilet and sitting. Questioning cues provided for pt to initiate clothing mgt prior to voiding bladder. Pt then ambulated to shower, initiated clothing removal and turning water on! Pt responding well to tactile cues and visual prompts to maximize thoroughness while bathing. Pt then then ambulated to supported chair and retrieved clothing items from drawers. Pt becoming distracted by soiled laundry on floor and tried to don soiled pants. Max cues for removal soiled pants and properly orienting new ones. Pt trying to don Teds over gripper socks. Cues for attention due to pt stopping while half of sock is off foot. With cue to initiate, pt ambulating into bathroom with Mod A to locate deoderant, visually scanning to shelf above toilet and retrieving successfully. At end of session pt ambulated back to enclosure bed and transitioned to supine. Sister Oley Balm reports concerns regarding extension of pts CIR stay.  Educated her on set d/c of 5/11 and encouraged her to speak with SW regarding date changes. Pt was left with sister and all needs.   Interpretor present throughout tx.      Therapy Documentation Precautions:  Precautions Precautions: Fall, Other (comment) Precaution Comments: bone flap removed R side of head in abdomen; hemet for OOB; Pt no longer with NG or PICC; enclosure bed Required Braces or Orthoses: Other Brace/Splint Other Brace/Splint: helmet for OOB Restrictions Weight Bearing Restrictions: No   Pain: 9/10 pain in abdomen at end of session, RN notified  Pain Assessment Pain Assessment: 0-10 Pain Score: 9  Pain Type: Acute pain Pain Location: Abdomen Pain Orientation: Right Pain Descriptors / Indicators: Sore Pain Frequency: Occasional Pain Intervention(s): Medication (See eMAR) ADL:      See Function Navigator for Current Functional Status.   Therapy/Group: Individual Therapy  Keylani Perlstein A Dinnis Rog 03/07/2017, 12:56 PM

## 2017-03-08 ENCOUNTER — Inpatient Hospital Stay (HOSPITAL_COMMUNITY): Payer: Self-pay | Admitting: Occupational Therapy

## 2017-03-08 ENCOUNTER — Inpatient Hospital Stay (HOSPITAL_COMMUNITY): Payer: Self-pay | Admitting: Speech Pathology

## 2017-03-08 ENCOUNTER — Inpatient Hospital Stay (HOSPITAL_COMMUNITY): Payer: Self-pay | Admitting: Physical Therapy

## 2017-03-08 LAB — CBC WITH DIFFERENTIAL/PLATELET
BASOS ABS: 0 10*3/uL (ref 0.0–0.1)
Basophils Relative: 0 %
Eosinophils Absolute: 0.1 10*3/uL (ref 0.0–0.7)
Eosinophils Relative: 3 %
HEMATOCRIT: 33.9 % — AB (ref 39.0–52.0)
Hemoglobin: 11.5 g/dL — ABNORMAL LOW (ref 13.0–17.0)
LYMPHS ABS: 1.6 10*3/uL (ref 0.7–4.0)
LYMPHS PCT: 36 %
MCH: 28.5 pg (ref 26.0–34.0)
MCHC: 33.9 g/dL (ref 30.0–36.0)
MCV: 84.1 fL (ref 78.0–100.0)
MONO ABS: 0.2 10*3/uL (ref 0.1–1.0)
Monocytes Relative: 4 %
NEUTROS ABS: 2.5 10*3/uL (ref 1.7–7.7)
Neutrophils Relative %: 57 %
Platelets: 238 10*3/uL (ref 150–400)
RBC: 4.03 MIL/uL — AB (ref 4.22–5.81)
RDW: 13.2 % (ref 11.5–15.5)
WBC: 4.4 10*3/uL (ref 4.0–10.5)

## 2017-03-08 LAB — BASIC METABOLIC PANEL
Anion gap: 8 (ref 5–15)
BUN: 13 mg/dL (ref 6–20)
CHLORIDE: 105 mmol/L (ref 101–111)
CO2: 26 mmol/L (ref 22–32)
Calcium: 9.1 mg/dL (ref 8.9–10.3)
Creatinine, Ser: 0.67 mg/dL (ref 0.61–1.24)
GFR calc Af Amer: >60 mL/min (ref >60)
GFR calc non Af Amer: >60 mL/min (ref >60)
Glucose, Bld: 93 mg/dL (ref 65–99)
POTASSIUM: 4.2 mmol/L (ref 3.5–5.1)
SODIUM: 139 mmol/L (ref 135–145)

## 2017-03-08 LAB — GLUCOSE, CAPILLARY
GLUCOSE-CAPILLARY: 99 mg/dL (ref 65–99)
GLUCOSE-CAPILLARY: 114 mg/dL — AB (ref 65–99)
Glucose-Capillary: 92 mg/dL (ref 65–99)
Glucose-Capillary: 128 mg/dL — ABNORMAL HIGH (ref 65–99)

## 2017-03-08 NOTE — Progress Notes (Addendum)
Occupational Therapy Weekly Progress Note  Patient Details  Name: [name] MRN: [id number] Date of Birth: 12/21/1965  Beginning of progress report period: 02/26/17 End of progress report period: 03/08/17 Today's Date: 03/08/2017 OT Individual Time: 0086-7619 and 1400-1446 OT Individual Time Calculation (min): 63 min and 46 min  Patient has met 3 of 4 short term goals.    Pt has continued to make progress towards LTG achievement. He completes bathing/dressing at [place] level for standing balance. Pt is now ambulating with Mod A hand held assist for functional transfers. He still requires Max-total A for cognition and safety awareness. Pt exhibits behaviors consistent with [place] Level V. 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 Caregiver training has been initiated with pts sister, [name] for safe transition home. Skilled OT will continue with caregiver education this week.     Patient continues to demonstrate the following deficits: unbalanced muscle activation, motor apraxia, decreased motor planning, delayed processing, hemiplegia, muscle weakness, decreased cardiorespiratoy endurance, ataxia and decreased coordination, decreased attention and decreased standing balance, decreased problem solving and decreased initiation and therefore will continue to benefit from skilled OT intervention to enhance overall performance with BADL.  Patient progressing toward long term goals..  Continue plan of care.  OT Short Term Goals Week 6:   STGs=LTGs due to ELOS  Skilled Therapeutic Interventions/Progress Updates:  Tx focus on caregiver education, cognitive remediation, and functional ambulation without device.   Pt greeted in couch beside sister. He reported that there were people in his bathroom. He ambulated with Mod A HHA into bathroom and concluded there were no others present. Pt then ambulated to therapy apartment. Had sister assist pt  with bedmaking, dishwashing, and putting dishes away in proper places. [name] providing pt with simple commands, tactile cues, and Min A for ambulating during IADL tasks. [name] also facilitating problem solving when pt made errors during tasks. Feedback provided by OT throughout. Pt oriented to place (hospital in [place]) during tx. Reports year as 53 but oriented to month, and disoriented to situation. Continued problem solving methods with [name] for finding extra assist at discharge including reaching out to family/church members. Oley Balm reports this is not an option.  At end of tx pt ambulated back to room Mod A HHA and transferred back to enclosure bed. Pt left with sister at time of departure.   Interpretor present throughout session.   2nd Session 1:1 tx (46 min) Tx focus on caregiver education and fall recovery.   Pt supine in enclosure bed at time of arrival. Sister and interpretor present. Pt ambulated to toilet with sister [name] providing HHA. OT provided instruction on assisting with balance to maximize his independence with toileting tasks. Afterwards pt ambulated down hallway with OT to dayroom. Practiced fall recovery on mat with visual demonstrations on technique provided by therapist with pt. Pt able to safely lower himself onto mat, and return to seated position on couch with min guard. [name] demonstrated carryover of education while assisting pt with fall recovery practice afterwards. Educated her on notifying fire department if pt falls and is unable to physically recover, and calling 911 if pt hits his head during fall. [name] verbalized understanding. Afterwards pt ambulated back to room with Mod A HHA from OT. He was transferred back to secure enclosure bed and left with sister present at time of departure.      Therapy Documentation Precautions:  Precautions Precautions: Fall, Other (comment) Precaution Comments: bone flap removed  R side of head in abdomen;  hemet for OOB; Pt no longer with NG or PICC; enclosure bed Required Braces or Orthoses: Other Brace/Splint Other Brace/Splint: helmet for OOB Restrictions Weight Bearing Restrictions: No General:   Vital Signs: Therapy Vitals Temp: 98.3 F (36.8 C) Temp Source: Oral Pulse Rate: 81 Resp: 18 BP: 115/71 Patient Position (if appropriate): Lying Oxygen Therapy SpO2: 98 % O2 Device: Not Delivered   ADL:      See Function Navigator for Current Functional Status.   Therapy/Group: Individual Therapy  Patrena Santalucia A Annasophia Crocker 03/08/2017, 7:32 AM

## 2017-03-08 NOTE — Progress Notes (Signed)
[facility] PHYSICAL MEDICINE & REHABILITATION     PROGRESS NOTE    Subjective/Complaints: Up working with therapy. No new problems  ROS: Limited due cognitive/behavioral   Objective: Vital Signs: Blood pressure 115/71, pulse 81, temperature 98.3 F (36.8 C), temperature source Oral, resp. rate 18, height 5\' 2"  (1.575 m), weight 60.4 kg (133 lb 1.6 oz), SpO2 98 %. No results found.  Recent Labs  03/08/17 0721  WBC 4.4  HGB 11.5*  HCT 33.9*  PLT 238    Recent Labs  03/08/17 0721  NA 139  K 4.2  CL 105  GLUCOSE 93  BUN 13  CREATININE 0.67  CALCIUM 9.1   CBG (last 3)   Recent Labs  03/07/17 1728 03/07/17 2051 03/08/17 0705  GLUCAP 134* 124* 92    Wt Readings from Last 3 Encounters:  03/07/17 60.4 kg (133 lb 1.6 oz)  02/01/17 65.6 kg (144 lb 9.6 oz)  07/31/16 68 kg (150 lb)    Physical Exam:    Gen: NAD. Well developed.  HENT:  Craniectomy scar  Cardiovascular:   RRR Respiratory:  CTA B. Unlabored GI: Soft. Bowel sounds are normal. Neurological:   Spontaneously moving all 4 extremities Psychiatric:   Flat but cooperative, pleasant Skin. Warm and dry. Crani wound without drainage. Areas of scab which are decreasing.   Assessment/Plan: 1. Cognitive and functional deficits secondary to right traumatic ICH/SDH s/p craniectomy which require 3+ hours per day of interdisciplinary therapy in a comprehensive inpatient rehab setting. Physiatrist is providing close team supervision and 24 hour management of active medical problems listed below. Physiatrist and rehab team continue to assess barriers to discharge/monitor patient progress toward functional and medical goals.  Function:  Bathing Bathing position   Position: Shower  Bathing parts Body parts bathed by patient: Right arm, Left arm, Chest, Abdomen, Right upper leg, Left upper leg, Right lower leg, Left lower leg, Front perineal area, Buttocks Body parts bathed by helper: Back  Bathing assist  Assist Level: Touching or steadying assistance(Pt > 75%)      Upper Body Dressing/Undressing Upper body dressing   What is the patient wearing?: Pull over shirt/dress     Pull over shirt/dress - Perfomed by patient: Thread/unthread right sleeve, Thread/unthread left sleeve, Put head through opening, Pull shirt over trunk Pull over shirt/dress - Perfomed by helper: Pull shirt over trunk        Upper body assist Assist Level: Supervision or verbal cues   Set up : To obtain clothing/put away  Lower Body Dressing/Undressing Lower body dressing   What is the patient wearing?: Underwear, Pants, Non-skid slipper socks, Ted Hose Underwear - Performed by patient: Thread/unthread right underwear leg, Thread/unthread left underwear leg, Pull underwear up/down   Pants- Performed by patient: Thread/unthread right pants leg, Thread/unthread left pants leg, Pull pants up/down Pants- Performed by helper: Pull pants up/down Non-skid slipper socks- Performed by patient: Don/doff left sock, Don/doff right sock Non-skid slipper socks- Performed by helper: Don/doff right sock, Don/doff left sock Socks - Performed by patient: Don/doff right sock, Don/doff left sock Socks - Performed by helper: Don/doff right sock, Don/doff left sock Shoes - Performed by patient: Don/doff right shoe, Don/doff left shoe, Fasten right, Fasten left Shoes - Performed by helper: Fasten right, Fasten left     TED Hose - Performed by patient: Don/doff right TED hose TED Hose - Performed by helper: Don/doff right TED hose, Don/doff left TED hose  Lower body assist Assist for lower body dressing: Touching  or steadying assistance (Pt > 75%)      Toileting Toileting Toileting activity did not occur: Safety/medical concerns Toileting steps completed by patient: Adjust clothing prior to toileting, Performs perineal hygiene, Adjust clothing after toileting Toileting steps completed by helper: Performs perineal hygiene Toileting  Assistive Devices: Grab bar or rail  Toileting assist Assist level: Touching or steadying assistance (Pt.75%)   Transfers Chair/bed transfer Chair/bed transfer activity did not occur:  (standard chair to w/c) Chair/bed transfer method: Stand pivot, Ambulatory Chair/bed transfer assist level: Touching or steadying assistance (Pt > 75%) Chair/bed transfer assistive device: Armrests Mechanical lift: Engineer, petroleumara   Locomotion Ambulation Ambulation activity did not occur: Safety/medical concerns   Max distance: 150 ft Assist level: Moderate assist (Pt 50 - 74%)   Wheelchair Wheelchair activity did not occur: Safety/medical concerns   Max wheelchair distance: 10 Assist Level: Dependent (Pt equals 0%)  Cognition Comprehension Comprehension assist level: Understands basic 50 - 74% of the time/ requires cueing 25 - 49% of the time  Expression Expression assist level: Expresses basic 50 - 74% of the time/requires cueing 25 - 49% of the time. Needs to repeat parts of sentences.  Social Interaction Social Interaction assist level: Interacts appropriately 50 - 74% of the time - May be physically or verbally inappropriate.  Problem Solving Problem solving assist level: Solves basic 25 - 49% of the time - needs direction more than half the time to initiate, plan or complete simple activities  Memory Memory assist level: Recognizes or recalls 25 - 49% of the time/requires cueing 50 - 75% of the time  Medical Problem List and Plan: 1. 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 DVT Prophylaxis/Anticoagulation: Subcutaneous heparin. Monitor platelet counts and any signs of bleeding. 3. Pain Management: Hycet  4. Mood: Ativan as needed 5. Neuropsych: This patient is notcapable of making decisions on hisown  behalf.  -HS seroquel for sleep/agitation, reduce to 25mg    -limiting benzos  -continue ritalin for arousal, attention and initiation- titrate to 15mg  bid  -enclosure bed for safety still required, renewed.  Continue d/t fall risk  -soft helmet for safety 6. Skin/Wound Care: Routine skin checks  -helmet for cranium protection  -flap site intact  -crani incision much improved---  7. Fluids/Electrolytes/Nutrition:    -intake reasonable  -I personally reviewed the patient's labs today.    -wnl 8.Seizure prophylaxis. Keppra 500 mg twice daily. Hesitate to decrease further given craniectomy 9.Pneumonia. resolved 10.Dysphagia. On D1 thins. Taking meds without issues 11.Urinary retention/recent Escherichia coli UTI s/p course of Cipro.   - repeat urine culture with 100k coag neg staph--- macrobid completed  -continue urecholine- 50 mg TID as still some retention  -timed voids, prn caths  -suspect cognitive/apraxic factors to emptying currently---   12.Diabetes mellitus with peripheral neuropathy. Hemoglobin A1c 10.4.  Patient on Glucophage 500 mg twice a day and Glucotrol 5 mg daily at home  -lantus 15u qhs, glipizide 5 mg  Relatively controlled 5/7 13.History of hypertension. Patient on lisinopril 2.5 mg daily prior to admission. Resume if >130/85  Controlled 5/7   Vitals:   03/07/17 1548 03/08/17 0538  BP: 120/79 115/71  Pulse: 86 81  Resp: 18 18  Temp: 98.4 F (36.9 C) 98.3 F (36.8 C)   14.Constipation. Laxative assistance     LOS (Days) 35 A FACE TO FACE EVALUATION  WAS PERFORMED  [name], MD 03/08/2017 8:59 AM

## 2017-03-08 NOTE — Progress Notes (Signed)
Discussed diet with sister.  Instructed that meals once he is home will still need to be pureed as they are here.  Sister started to get tearful again.  Assured her that will need to make only minimal changes from family dinner.  Informed once a week to make a puree of must used veggies such as peppers, onions or any other combination with meals, puree a weeks worth and use an ice tray to fill each area and remove only the needed portion.  For meats replace with beans or can use baby jar food.  Informed that will not be able to have tortillas or any other items that can not puree.  She verbalized an understanding of meal prep for pt.

## 2017-03-08 NOTE — Progress Notes (Signed)
Physical Therapy Weekly Progress Note  Patient Details  Name: [name] MRN: [id number] Date of Birth: Sep 04, 1966  Beginning of progress report period: Mar 02, 2017 End of progress report period: Mar 09, 2017  Today's Date: 03/09/2017  Patient has met 2 of 2 short term goals.  Pt is making fair progress towards LTG's. Family training has been initiated with pt's sister Dennison Nancy) however she will benefit from as much hands on training as possible to increase her comfort level with assisting pt. Pt with improving initiation of tasks, requiring only min/mod cuing at times. Pt is also able to ambulate with mod assist and complete some transfers with min assist. Pt would benefit from continued skilled PT treatment to focus on cognitive remediation, transfer & gait training, stair training, NMR, and for continued d/c planning.  Patient continues to demonstrate the following deficits muscle weakness, decreased cardiorespiratoy endurance, decreased coordination, decreased visual perceptual skills, decreased attention to left, decreased initiation, decreased attention, decreased awareness, decreased problem solving, decreased safety awareness, decreased memory and delayed processing, and decreased standing balance, decreased postural control and decreased balance strategies and therefore will continue to benefit from skilled PT intervention to increase functional independence with mobility.  Patient progressing toward long term goals..  Continue plan of care.  PT Short Term Goals Week 5:  PT Short Term Goal 1 (Week 5): Pt will negotiate 4 steps with mod assist +1. PT Short Term Goal 1 - Progress (Week 5): Met PT Short Term Goal 2 (Week 5): Pt will consistently ambulate 100 ft with mod assist +1. PT Short Term Goal 2 - Progress (Week 5): Met Week 6:  PT Short Term Goal 1 (Week 6): STG = LTG due to estimated d/c date.    Therapy Documentation Precautions:  Precautions Precautions: Fall,  Other (comment) Precaution Comments: bone flap removed R side of head in abdomen; hemet for OOB; Pt no longer with NG or PICC; enclosure bed Required Braces or Orthoses: Other Brace/Splint Other Brace/Splint: helmet for OOB Restrictions Weight Bearing Restrictions: No   See Function Navigator for Current Functional Status.  Therapy/Group: Individual Therapy  [name] 03/09/2017, 7:53 AM

## 2017-03-08 NOTE — Progress Notes (Signed)
Social Work Patient ID: Jamie BeckwithMiguel Garcia Berg, male   DOB: 31-Dec-1965, 51 y.o.   MRN: [id number]   Spoke with sister today who has been tearful throughout the morning as she expresses ongoing anxiety about taking pt home.  She continues to state that she does not want to take him home without bone flap replaced and pt able to move about in her home with only supervision.  I have attempted to stay very direct with her about the reality that he is not expected to be at that level by our targeted dc date of 5/11 and may not be functioning at that desired level for several weeks.  While she did not say directly to this SW, therapies report that sister has asked "what if I just don't show up (at discharge)."  I have tried to keep sister focused on learning pt's care and team is to re-conf tomorrow afternoon.  Last week we discussed possible extension of stay, however, this would only be a few days that would be considered.  Sister asking to speak with Dr. Riley KillSwartz.  I have alerted him that she will be here in the morning from ~ 6:45 - 8:15 am.  He is aware of her concerns.  Have also discussed with Kandis Mannanon Huston, Rehab Director.  Continue to address concerns.  Mekhi Lascola, LCSW

## 2017-03-08 NOTE — Progress Notes (Addendum)
Physical Therapy Session Note  Patient Details  Name: [name] MRN: [id number] Date of Birth: 1966/10/13  Today's Date: 03/08/2017 PT Individual Time: 1000-1055 PT Individual Time Calculation (min): 55 min   Short Term Goals: Week 5:  PT Short Term Goal 1 (Week 5): Pt will negotiate 4 steps with mod assist +1. PT Short Term Goal 2 (Week 5): Pt will consistently ambulate 100 ft with mod assist +1.  Skilled Therapeutic Interventions/Progress Updates:  Pt received in room with sister Jamie Rodriguez([name]) present for family training. Pt's sister voicing she does not feel capable of caring for pt until he has bone flap replaced and can ambulate more independently. Therapist provided emotional support and therapeutic listening, as well as education regarding pt's progress, CLOF, and anticipated progress. Jamie Rodriguezdriana still very insistent that pt needs to have these things done before his d/c home - LCSW made aware. [name] provided pictures of outside of home reporting pt either has to negotiate steep hill, multiple steps without rails, or either a very long walkway to access home. Discussed potential need for w/c 2/2 to pt fatigue and inability to consistently ambulate long distances. Therapist then assisted pt with ambulation & pt & sister return demonstrated ambulation providing stability at pt's hips and shoulders and then again with a gait belt. Jamie Rodriguezdriana reports she feels slightly more comfortable with use of gait belt but she is not able to independently assist pt as therapist maintains physical assistance while Jamie Rodriguezdriana is helping him. Jamie Rodriguezdriana also requires max cuing for positioning and how to instruct pt for increased safety for them both. Pt then performed car transfer with therapist and return demonstrated it with [name]. Pt requires min assist to complete car transfer at small SUV simulated height with improved awareness of LLE. Pt's sister tearful throughout session with therapist providing continued  education & support. Pt returned to room & left in recliner with sister present to supervise.   Therapy Documentation Precautions:  Precautions Precautions: Fall, Other (comment) Precaution Comments: bone flap removed R side of head in abdomen; hemet for OOB; Pt no longer with NG or PICC; enclosure bed Required Braces or Orthoses: Other Brace/Splint Other Brace/Splint: helmet for OOB Restrictions Weight Bearing Restrictions: No  Pain: No behaviors or c/o pain.   See Function Navigator for Current Functional Status.   Therapy/Group: Individual Therapy  [name] 03/08/2017, 12:57 PM

## 2017-03-08 NOTE — Progress Notes (Signed)
Occupational Therapy Session Note  Patient Details  Name: Jamie BeckwithMiguel Garcia Berg MRN: [id number] Date of Birth: [birth year]  Today's Date: 03/08/2017 OT Individual Time: 0800-0859 OT Individual Time Calculation (min): 59 min     Skilled Therapeutic Interventions/Progress Updates:    Upon entering the room, pt supine in vale bed with no c/o pain and greets therapist. Pt requesting to eat breakfast sitting on EOB. Pt needing assistance to open containers and min verbal cues to follow swallowing precautions. Pt also needing cues to utilize L UE to hold containers and to scan tray to L to locate all utensils needed. Pt ambulating with mod HHA into bathroom for shower. Pt bathing self with min A overall. Dressing from chair with min A for balance. Clothing placed on dresser next to him and pt donning all clothing pieces without cues for initiation or sequence this session. Pt standing at sink for 5 minutes to brush teeth and wash hands with supervision. Pt ambulating 75' to day room with mod A and sitting in recliner chair similar to home environment. Pt able to stand from recliner with supervision and return to room in same manner as above. Pt returning to bed and secured in vale bed upon therapist exiting the room.   Therapy Documentation Precautions:  Precautions Precautions: Fall, Other (comment) Precaution Comments: bone flap removed R side of head in abdomen; hemet for OOB; Pt no longer with NG or PICC; enclosure bed Required Braces or Orthoses: Other Brace/Splint Other Brace/Splint: helmet for OOB Restrictions Weight Bearing Restrictions: No General:   Vital Signs: Therapy Vitals Temp: 98.3 F (36.8 C) Temp Source: Oral Pulse Rate: 81 Resp: 18 BP: 115/71 Patient Position (if appropriate): Lying Oxygen Therapy SpO2: 98 % O2 Device: Not Delivered Pain:   ADL:   Vision/Perception     Exercises:   Other Treatments:    See Function Navigator for Current Functional  Status.   Therapy/Group: Individual Therapy  [name], Haygen Zebrowski P 03/08/2017, 9:04 AM

## 2017-03-08 NOTE — Progress Notes (Signed)
Sister noted to be tearful.  York SpanielSaid that they are d/c pt and he is not ready to come home because he is not safe. Informed that I would let social worker know. Informed Valentina GuLucy that sister has concerns.

## 2017-03-08 NOTE — Progress Notes (Signed)
Speech Language Pathology Daily Session Note  Patient Details  Name: Jamie BeckwithMiguel Garcia Cardenas MRN: [id number] Date of Birth: [birth year]  Today's Date: 03/08/2017 SLP Individual Time: [id number] SLP Individual Time Calculation (min): 44 min  Short Term Goals: Week 5: SLP Short Term Goal 1 (Week 5): Patient will consume current diet with minimal overt s/s of aspiration with supervision verbal cues for use of swallowing compensatory strategies.  SLP Short Term Goal 2 (Week 5): Patient will demonstrate efficient mastication with mild oral residue and minimal overt s/s of aspiration with Mod A verbal cues over 2 sessions prior to upgrade.  SLP Short Term Goal 3 (Week 5): Patient will demonstrate sustained attention to a functional task for ~10 minutes with Mod A verbal cues for redirection.  SLP Short Term Goal 4 (Week 5): Patient will orient to place, situation and time with Mod  A multimodal cues.  SLP Short Term Goal 5 (Week 5): Patient will demonstrate basic problem solving for basic tasks with Max A verbal cues.   Skilled Therapeutic Interventions: Skilled treatment session focused on addressing dysphagia and cognition goals. SLP facilitated session by providing verbal instruction with teach back opportunities for education regarding dysphagia and cognition related to dysphagia education.  Sister provided full supervision of patient consuming Dys.1 textures and thin liquids via straw with intermittent encouragement cues for patient to eat.  Patient's sister was able to verbalize what she would do if patient were more impulsive and reported wanting to take on this task to prepare for going home.  She reported feeling comfortable pureeing foods at home but expressed concern about how/when she would know he was ready for solid foods.  I educated her on the timing of his thinking becoming better, slowing down when he eats, and demonstrating the ability to chew foods.  We also discussed some transition foods  such as banana, avocado, beans, etc. For kind of pureed and soft solid transition when ready.  Patient demonstrated sustained attention to task for ~10 minutes with Mod-Max assist verbal cues for redirection.  Care plan modified for sister to provide full supervision with meals; RN notified.     Function:  Eating Eating   Modified Consistency Diet: Yes Eating Assist Level: Set up assist for;Supervision or verbal cues;Helper checks for pocketed food   Eating Set Up Assist For: Opening containers       Cognition Comprehension Comprehension assist level: Understands basic 50 - 74% of the time/ requires cueing 25 - 49% of the time  Expression   Expression assist level: Expresses basic 75 - 89% of the time/requires cueing 10 - 24% of the time. Needs helper to occlude trach/needs to repeat words.  Social Interaction Social Interaction assist level: Interacts appropriately 75 - 89% of the time - Needs redirection for appropriate language or to initiate interaction.  Problem Solving Problem solving assist level: Solves basic 50 - 74% of the time/requires cueing 25 - 49% of the time  Memory Memory assist level: Recognizes or recalls 25 - 49% of the time/requires cueing 50 - 75% of the time    Pain Pain Assessment Pain Assessment: No/denies pain  Therapy/Group: Individual Therapy  Charlane FerrettiMelissa Gizell Danser, M.A., CCC-SLP [phone]  Ama Mcmaster 03/08/2017, 2:10 PM

## 2017-03-08 NOTE — Plan of Care (Signed)
Problem: RH Wheelchair Mobility Goal: LTG Patient will propel w/c in controlled environment (PT) LTG: Patient will propel wheelchair in controlled environment, # of feet with assist (PT)  Outcome: Adequate for Discharge D/c goal - pt primarily ambulatory

## 2017-03-09 ENCOUNTER — Inpatient Hospital Stay (HOSPITAL_COMMUNITY): Payer: Self-pay | Admitting: Physical Therapy

## 2017-03-09 ENCOUNTER — Inpatient Hospital Stay (HOSPITAL_COMMUNITY): Payer: Self-pay | Admitting: Speech Pathology

## 2017-03-09 ENCOUNTER — Inpatient Hospital Stay (HOSPITAL_COMMUNITY): Payer: Self-pay | Admitting: Occupational Therapy

## 2017-03-09 LAB — GLUCOSE, CAPILLARY
GLUCOSE-CAPILLARY: 110 mg/dL — AB (ref 65–99)
GLUCOSE-CAPILLARY: 140 mg/dL — AB (ref 65–99)
GLUCOSE-CAPILLARY: 129 mg/dL — AB (ref 65–99)
Glucose-Capillary: 98 mg/dL (ref 65–99)

## 2017-03-09 MED ORDER — QUETIAPINE FUMARATE 25 MG PO TABS
12.5000 mg | ORAL_TABLET | Freq: Every day | ORAL | Status: DC
  Administered 2017-03-09 – 2017-03-18 (×10): 12.5 mg via ORAL
  Filled 2017-03-09 (×11): qty 1

## 2017-03-09 MED ORDER — QUETIAPINE FUMARATE 25 MG PO TABS: 25.0000 mg | ORAL_TABLET | Freq: Every evening | ORAL | Status: DC | PRN

## 2017-03-09 NOTE — Progress Notes (Signed)
[facility]     PROGRESS NOTE    Subjective/Complaints: Pt sitting up feeding himself breakfast. Had some RLQ pain yesterday. (10/10 per sister). Mildly tender this am. Otherwise feels well  ROS: Limited due cognitive/behavioral    Objective: Vital Signs: Blood pressure 118/72, pulse 83, temperature 98 F (36.7 C), temperature source Oral, resp. rate 18, height 5\' 2"  (1.575 m), weight 60.4 kg (133 lb 1.6 oz), SpO2 100 %. No results found.  Recent Labs  03/08/17 0721  WBC 4.4  HGB 11.5*  HCT 33.9*  PLT 238    Recent Labs  03/08/17 0721  NA 139  K 4.2  CL 105  GLUCOSE 93  BUN 13  CREATININE 0.67  CALCIUM 9.1   CBG (last 3)   Recent Labs  03/08/17 1639 03/08/17 2050 03/09/17 0706  GLUCAP 99 114* 110*    Wt Readings from Last 3 Encounters:  03/07/17 60.4 kg (133 lb 1.6 oz)  02/01/17 65.6 kg (144 lb 9.6 oz)  07/31/16 68 kg (150 lb)    Physical Exam:    Gen: NAD. Well developed.  HENT:  Craniectomy scar  Cardiovascular: RRR Respiratory:  CTA B GI: Soft. Bowel sounds are normal. Neurological:   Spontaneously moving all 4 extremities. Engaging left side more. More attentive Psychiatric:   Flat but very cooperative, pleasant Skin. Warm and dry. Crani wound without drainage. Areas scab decreased   Assessment/Plan: 1. Cognitive and functional deficits secondary to right traumatic ICH/SDH s/p craniectomy which require 3+ hours per day of interdisciplinary therapy in a comprehensive inpatient rehab setting. Physiatrist is providing close team supervision and 24 hour management of active medical problems listed below. Physiatrist and rehab team continue to assess barriers to discharge/monitor patient progress toward functional and medical goals.  Function:  Bathing Bathing position   Position: Shower  Bathing parts Body parts bathed by patient: Right arm, Left arm, Chest, Abdomen, Right upper leg, Left upper leg, Right  lower leg, Left lower leg, Front perineal area, Buttocks Body parts bathed by helper: Back  Bathing assist Assist Level: Touching or steadying assistance(Pt > 75%)      Upper Body Dressing/Undressing Upper body dressing   What is the patient wearing?: Pull over shirt/dress     Pull over shirt/dress - Perfomed by patient: Thread/unthread right sleeve, Thread/unthread left sleeve, Put head through opening, Pull shirt over trunk Pull over shirt/dress - Perfomed by helper: Pull shirt over trunk        Upper body assist Assist Level: Supervision or verbal cues   Set up : To obtain clothing/put away  Lower Body Dressing/Undressing Lower body dressing   What is the patient wearing?: Underwear, Pants, Socks, Shoes Underwear - Performed by patient: Thread/unthread right underwear leg, Thread/unthread left underwear leg, Pull underwear up/down   Pants- Performed by patient: Thread/unthread right pants leg, Thread/unthread left pants leg, Pull pants up/down Pants- Performed by helper: Pull pants up/down Non-skid slipper socks- Performed by patient: Don/doff left sock, Don/doff right sock Non-skid slipper socks- Performed by helper: Don/doff right sock, Don/doff left sock Socks - Performed by patient: Don/doff right sock, Don/doff left sock Socks - Performed by helper: Don/doff right sock, Don/doff left sock Shoes - Performed by patient: Don/doff right shoe, Don/doff left shoe, Fasten right, Fasten left Shoes - Performed by helper: Fasten right, Fasten left     TED Hose - Performed by patient: Don/doff right TED hose TED Hose - Performed by helper: Don/doff right TED hose, Don/doff  left TED hose  Lower body assist Assist for lower body dressing: Touching or steadying assistance (Pt > 75%)      Toileting Toileting Toileting activity did not occur: Safety/medical concerns Toileting steps completed by patient: Adjust clothing prior to toileting, Performs perineal hygiene, Adjust clothing  after toileting Toileting steps completed by helper: Performs perineal hygiene Toileting Assistive Devices: Grab bar or rail  Toileting assist Assist level: Touching or steadying assistance (Pt.75%)   Transfers Chair/bed transfer Chair/bed transfer activity did not occur:  (standard chair to w/c) Chair/bed transfer method: Stand pivot, Ambulatory Chair/bed transfer assist level: Touching or steadying assistance (Pt > 75%) Chair/bed transfer assistive device: Armrests Mechanical lift: Engineer, petroleum Ambulation activity did not occur: Safety/medical concerns   Max distance: 67' Assist level: Moderate assist (Pt 50 - 74%)   Wheelchair Wheelchair activity did not occur: Safety/medical concerns   Max wheelchair distance: 10 Assist Level: Dependent (Pt equals 0%)  Cognition Comprehension Comprehension assist level: Understands basic 50 - 74% of the time/ requires cueing 25 - 49% of the time  Expression Expression assist level: Expresses basic 75 - 89% of the time/requires cueing 10 - 24% of the time. Needs helper to occlude trach/needs to repeat words.  Social Interaction Social Interaction assist level: Interacts appropriately 75 - 89% of the time - Needs redirection for appropriate language or to initiate interaction.  Problem Solving Problem solving assist level: Solves basic 50 - 74% of the time/requires cueing 25 - 49% of the time  Memory Memory assist level: Recognizes or recalls 25 - 49% of the time/requires cueing 50 - 75% of the time  Medical Problem List and Plan: 1. 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 Referred to losing another brother just prior to Delan's injury. "I don't  want to lose him". Feels stressed being the sole caregiver and having two children of her own. Is worried about potential injury/seizure at home, especially if he goes home without flap as [place] doesn't like to wear helmet. 2. DVT Prophylaxis/Anticoagulation: Subcutaneous heparin. Monitor platelet counts and any signs of bleeding. 3. Pain Management: Hycet  4. Mood: Ativan as needed 5. Neuropsych: This patient is notcapable of making decisions on hisown behalf.  -HS seroquel for sleep/agitation, reduced to 25mg --decrease further to 12 today  -limiting benzos  -continue ritalin for arousal, attention and initiation-   15mg  bid  -enclosure bed for safety still required, renewed.  Continue d/t fall risk  -soft helmet for safety 6. Skin/Wound Care: Routine skin checks  -helmet for cranium protection  -flap site intact  -crani incision much improved---   -have contacted NS about potential cranioplasty 7. Fluids/Electrolytes/Nutrition:    -intake reasonable 8.Seizure prophylaxis. Keppra 500 mg twice daily. Hesitate to decrease further given craniectomy 9.Pneumonia. resolved 10.Dysphagia. On D1 thins. Taking meds without issues 11.Urinary retention/recent Escherichia coli UTI s/p course of Cipro.   - repeat urine culture with 100k coag neg staph--- macrobid completed  -continue urecholine- 50 mg TID--showing improvement this week with an occasional larger PVR   -continue timed voids, prn caths  -suspect cognitive/apraxic factors to emptying  12.Diabetes mellitus with peripheral neuropathy. Hemoglobin A1c 10.4.  Patient on Glucophage 500 mg twice a day and Glucotrol 5 mg daily at home  -  lantus 15u qhs, glipizide 5 mg    13.History of hypertension. Patient on lisinopril 2.5 mg daily prior to admission. Resume if >130/85  Controlled 5/7   Vitals:   03/08/17 1430 03/09/17 0432  BP: 120/75 118/72  Pulse: 80 83  Resp: 18 18  Temp: 98.4 F (36.9 C) 98 F (36.7 C)   14.Constipation.  Laxative assistance     LOS (Days) 36 A FACE TO FACE EVALUATION WAS PERFORMED  [name], MD 03/09/2017 8:49 AM

## 2017-03-09 NOTE — Patient Care Conference (Signed)
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 Essential hypertension   . Agitated   . Lethargy   . Benign essential HTN   . Poorly controlled type 2 diabetes mellitus with peripheral neuropathy (HCC)   . 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 Aphasia 02/01/2017  . Dysphagia, oropharyngeal 02/01/2017  . Intracerebral hemorrhage (HCC) 01/14/2017  . Fall 01/10/2017  . Subdural hemorrhage (HCC) 01/10/2017  . Diabetes type 2, uncontrolled (HCC) 06/13/2015    Expected Discharge Date: Expected Discharge Date: 03/19/17  Team Members Present: Physician leading conference: Dr. Faith Rogue Social Worker Present: Amada Jupiter, LCSW Nurse Present: Ronny Bacon, RN PT Present: Aleda Grana, PT OT Present: Callie Fielding, Marye Round, OT SLP Present: Feliberto Gottron, SLP PPS Coordinator present : Tora Duck, RN, CRRN     Current Status/Progress Goal Weekly Team Focus  Medical   improving attention and awareness, wound healed. still with flap in abdomen  improve cognition and motor engagement of left side  rx uti, mazimize blader emptyng and continence   Bowel/Bladder   continent of bowel and bladder with timed toileting,   continent of bowel and bladder  Assist with tolieting needsm, timed toileting    Swallow/Nutrition/ Hydration   Dys. 1 textures with thin liquids, Min A  Min A with least restrictive diet  use of swallowing strategies, trials of upgraded textures    ADL's   Min A bathing at shower level, supervision UB dressing, Min A LB dressing, Mod A functional ambulation hand held assist for bathroom transfers  Min-Mod A overall   Caregiver education, cognitive remediation, dynamic balance, NMR, ADL retraining    Mobility   supervision sit>stand, min assist car transfer, mod assist ambulation without AD  min assist overall  family education & d/c planning, NMR, gait training, stair negotiation, transfers   Communication             Safety/Cognition/ Behavioral Observations  Mod-Max A  Mod A  initiation, orientation, attention, problem solving    Pain   pain right abd managed with norco 1 tab  ,2  Assess pain q shift and prn   Skin   crani incison OTA betadine BID   no new skin breakdown/infection  Assess skin q shift and prn    Rehab Goals Patient on target to meet rehab goals: Yes *See Care Plan and progress notes for long and short-term goals.  Barriers to Discharge: family education, stabilize pt's medical/surgical needs for discharge    Possible Resolutions to Barriers:  medical mgt, cognitive remediation. ?replacement of bone flap?    Discharge Planning/Teaching Needs:  Plan for pt to d/c home with sister and her family.  Sister to be primary caregiver and aware need for 24/7  support.  Sister continues to be very anxious about taking pt home.  Will determine if extended LOS is warranted.  Teaching this week has involved much more "hands on" with sister.   Team Discussion:  Overall continues to make functional improvements;  Better use of left side;  Await NS input about flap.  MD spoke with pt's sister this morning about goals and focus of home d/c.  Pt still holding food and liquids in mouth - apraxia.  Has been cont b/b ~ one week.  Can be min assist with txs but  more assist as he fatigues. Sister has begun "hands on" education.  Team feels an extension of LOS is warranted as pt continues to make gains.  Change target now to 03/19/17  Revisions to Treatment Plan:  Change in target d/c date to 5/18   Continued Need for Acute Rehabilitation Level of Care: The patient requires daily medical management by a physician with specialized training in physical medicine and rehabilitation for the following conditions: Daily direction of a multidisciplinary physical rehabilitation program to ensure safe treatment while eliciting the highest outcome that is of practical value to the patient.: Yes Daily medical management of patient stability for increased activity during participation in an intensive rehabilitation regime.: Yes Daily analysis of laboratory values and/or radiology reports with any subsequent need for medication adjustment of medical intervention for : Post surgical problems;Neurological problems;Mood/behavior problems  Jason Hauge 03/09/2017, 6:36 PM

## 2017-03-09 NOTE — Progress Notes (Signed)
Initial Nutrition Assessment  DOCUMENTATION CODES:   Not applicable  INTERVENTION:   -Continue Glucerna Shake po TID, each supplement provides 220 kcal and 10 grams of protein  -Encourage adequate PO intake  NUTRITION DIAGNOSIS:   Increased nutrient needs related to acute illness as evidenced by estimated needs.  Ongoing but being addressed via supplements  GOAL:   Patient will meet greater than or equal to 90% of their needs  Met  MONITOR:   PO intake, Supplement acceptance, Labs, Weight trends, Skin, I & O's, Diet advancement  REASON FOR ASSESSMENT:   Consult Enteral/tube feeding initiation and management  ASSESSMENT:   [name] with history of hypertension, diabetes mellitus. Presented 01/10/2017 after being found down from a supposed fall. CT of the head and imaging revealed intracranial hemorrhage with a 4.5 cm right frontal intraparenchymal hemorrhage, 6 mm right subdural hematoma and other scattered areas of intraparenchymal and subarachnoid hemorrhage with right to left midline shift. Nondisplaced vertical fracture through the left temporal bone.  Underwent right frontal temporal parietal craniectomy for hematoma evacuation 01/14/2017 per Dr. Cyndy Freeze with placement of bone flap in the abdomen.  Nasogastric tube in place for nutritional support patient remains NPO  Continues on Dysphagia I Diet with thin Liquids eating mostly 100% of all meals. Pt feeding himself. Pt also receiving Glucerna shakes and drinking  Weight relatively stable since admission. Weight down initially but not trending back up. Current wt 133 pounds. Weight on admission 134 pounds.  Labs: reviewed Meds: reviewed  Diet Order:  DIET - DYS 1 Room service appropriate? Yes; Fluid consistency: Thin  Skin:  Wound (see comment) (incision on head, abdomen; no pressure ulcers)  Last BM:  4/30  Height:   Ht Readings from Last 1 Encounters:  02/01/17 '5\' 2"'$   (1.575 m)    Weight:   Wt Readings from Last 1 Encounters:  03/07/17 133 lb 1.6 oz (60.4 kg)   Filed Weights   03/03/17 0900 03/06/17 1720 03/07/17 0600  Weight: 134 lb 11.2 oz (61.1 kg) 132 lb 1.6 oz (59.9 kg) 133 lb 1.6 oz (60.4 kg)    Ideal Body Weight:  53.6 kg  BMI:  Body mass index is 24.34 kg/m.  Estimated Nutritional Needs:   Kcal:  1800-2000  Protein:  100-115 grams  Fluid:  >/= 1.8 L/day  EDUCATION NEEDS:   No education needs identified at this time  [place], [place], LDN [phone] Pager  [phone] Weekend/On-Call Pager

## 2017-03-09 NOTE — Progress Notes (Signed)
Speech Language Pathology Weekly Progress and Session Note  Patient Details  Name: [name] MRN: [id number] Date of Birth: Nov 06, 1965  Beginning of progress report period: Mar 02, 2017 End of progress report period: Mar 09, 2017  Today's Date: 03/09/2017 SLP Individual Time: 1000-1100 SLP Individual Time Calculation (min): 60 min  Short Term Goals: Week 5: SLP Short Term Goal 1 (Week 5): Patient will consume current diet with minimal overt s/s of aspiration with supervision verbal cues for use of swallowing compensatory strategies.  SLP Short Term Goal 1 - Progress (Week 5): Not met SLP Short Term Goal 2 (Week 5): Patient will demonstrate efficient mastication with mild oral residue and minimal overt s/s of aspiration with Mod A verbal cues over 2 sessions prior to upgrade.  SLP Short Term Goal 2 - Progress (Week 5): Not met SLP Short Term Goal 3 (Week 5): Patient will demonstrate sustained attention to a functional task for ~10 minutes with Mod A verbal cues for redirection.  SLP Short Term Goal 3 - Progress (Week 5): Met SLP Short Term Goal 4 (Week 5): Patient will orient to place, situation and time with Mod  A multimodal cues.  SLP Short Term Goal 4 - Progress (Week 5): Met SLP Short Term Goal 5 (Week 5): Patient will demonstrate basic problem solving for basic tasks with Max A verbal cues.  SLP Short Term Goal 5 - Progress (Week 5): Met    New Short Term Goals: Week 6: SLP Short Term Goal 1 (Week 6): Patient will consume current diet with minimal overt s/s of aspiration with supervision verbal cues for use of swallowing compensatory strategies.  SLP Short Term Goal 2 (Week 6): Patient will demonstrate efficient mastication with mild oral residue and minimal overt s/s of aspiration with Mod A verbal cues over 2 sessions prior to upgrade.  SLP Short Term Goal 3 (Week 6): Patient will demonstrate selective attention to a functional task for ~10 minutes with Mod A verbal cues  for redirection in a mildly distracting enviornment.  SLP Short Term Goal 4 (Week 6): Patient will orient to place, situation and time with Min  A multimodal cues.  SLP Short Term Goal 5 (Week 6): Patient will demonstrate basic problem solving for basic tasks with Mod A verbal cues.   Weekly Progress Updates: Patient has made functional gains and has met 3 of 5STG's this reporting period. Currently, patient demonstrates behaviors consistent with a Rancho Level VI and requires overall Mod-Max A cues to complete functional and familiar tasks safely in regards to orientation, problem solving, sustained attention and awareness.  Patient continues to consume Dys. 1 textures with thin liquids with minimal overt s/s of aspiration and requires Min A verbal cues for use of swallowing compensatory strategies. Patient is consuming trials of Dys. 2 textures but continues to demonstrate decreased mastication with oral residue. Recommend continued trials with SLP only. Patient and family education is ongoing. Patient would benefit from continued skilled SLP intervention to maximize his cognitive and swallowing function and overall functional independence prior to discharge.     Intensity: Minumum of 1-2 x/day, 30 to 90 minutes Frequency: 3 to 5 out of 7 days Duration/Length of Stay: 03/19/17 Treatment/Interventions: English as a second language teacher;Dysphagia/aspiration precaution training;Internal/external aids;Environmental controls;Therapeutic Activities;Patient/family education;Functional tasks;Cognitive remediation/compensation   Daily Session  Skilled Therapeutic Interventions: Skilled treatment session focused on cognitive goals. SLP facilitated session by providing Mod-Max A verbal cues for problem solving with a novel, mildly complex task. Increased cueing needed as session  went on, suspect due to decreased attention to task. However, patient demonstrated selective attention to task in a mildly distracting environment for  ~15 minutes with Mod A verbal cues for redirection. Patient left upright in wheelchair with all needs within reach. Continue with current plan of care.        Function:   Cognition Comprehension Comprehension assist level: Understands basic 50 - 74% of the time/ requires cueing 25 - 49% of the time  Expression   Expression assist level: Expresses basic 75 - 89% of the time/requires cueing 10 - 24% of the time. Needs helper to occlude trach/needs to repeat words.  Social Interaction Social Interaction assist level: Interacts appropriately 75 - 89% of the time - Needs redirection for appropriate language or to initiate interaction.  Problem Solving Problem solving assist level: Solves basic 50 - 74% of the time/requires cueing 25 - 49% of the time  Memory Memory assist level: Recognizes or recalls 25 - 49% of the time/requires cueing 50 - 75% of the time   Pain No/Denies Pain   Therapy/Group: Individual Therapy  [name] 03/09/2017, 3:12 PM

## 2017-03-09 NOTE — Progress Notes (Signed)
Occupational Therapy Session Note  Patient Details  Name: Kamaury Cutbirth MRN: [id number] Date of Birth: 1965-12-28  Today's Date: 03/09/2017 OT Individual Time: 1100-1200 OT Individual Time Calculation (min): 60 min    Short Term Goals: Week 3:  OT Short Term Goal 1 (Week 3): Pt will perform toileting with max A in order to decrease assistance with functional task.  OT Short Term Goal 1 - Progress (Week 3): Met OT Short Term Goal 2 (Week 3): Pt will utilize L UE in functional tasks with mod multimodal cues. OT Short Term Goal 2 - Progress (Week 3): Met OT Short Term Goal 3 (Week 3): Pt will initiate sit <>stand for LB clothing management with mod A.  OT Short Term Goal 3 - Progress (Week 3): Met Week 4:  OT Short Term Goal 1 (Week 4): Pt will transfer to toilet with min A with family member OT Short Term Goal 2 (Week 4): Pt will demonstrate intellecual awarenss with mod A (of at least two things) OT Short Term Goal 3 (Week 4): Pt will transfer into tub shower without grab bars with LRAD with min A  OT Short Term Goal 4 (Week 4): Pt will be oriented x3 with external aids with mod A  Skilled Therapeutic Interventions/Progress Updates:    Upon entering the room, pt in enclosure bed awaiting therapist arrival. Pt with no c/o,signs, or symptoms of pain. Pt ambulated short distance into bathroom of 15' with min A. Pt engaged in bathing at shower level with min A for balance with task. Pt needing min cues for safety and sequence. Pt ambulating to sit in chair next to dresser where he reaches in and obtains clothing items. Pt putting elastic waist shorts on backwards but does not notice and when provided cues for problem solving situation he states, " No, this is how they go." Pt ambulating to stand at sink to brush teeth with supervision for standing balance. Pt then begins ambulating in room while still holding toothbrush in L hand and is unaware needing min cues to attend to task. Pt  cleaning up towels and placing dirty clothing in bag with increased time needed to release items from L hand. Pt ambulating in hallway with increased mod A secondary to forward lean with ambulation. He was unable to navigate back to room without max multimodal cues but he did verbalize correct room number when asked. Pt returning to bed and bed secured.   Therapy Documentation Precautions:  Precautions Precautions: Fall, Other (comment) Precaution Comments: bone flap removed R side of head in abdomen; hemet for OOB; Pt no longer with NG or PICC; enclosure bed Required Braces or Orthoses: Other Brace/Splint Other Brace/Splint: helmet for OOB Restrictions Weight Bearing Restrictions: No General: General PT Missed Treatment Reason:  (MD care) Vital Signs:   Pain:   ADL:   Vision/Perception     Exercises:   Other Treatments:    See Function Navigator for Current Functional Status.   Therapy/Group: Individual Therapy  Gypsy Decant 03/09/2017, 12:57 PM

## 2017-03-09 NOTE — Progress Notes (Signed)
Physical Therapy Session Note  Patient Details  Name: [name] MRN: [id number] Date of Birth: [birth year]  Today's Date: 03/09/2017 PT Individual Time: 0820-0900 and 4098-1191 PT Individual Time Calculation (min): 40 min and 83 min  Short Term Goals: Week 6:  PT Short Term Goal 1 (Week 6): STG = LTG due to estimated d/c date.  Skilled Therapeutic Interventions/Progress Updates:  Treatment 1: Pt received in room with sister [name]) present. Doctor arrived to discuss medical issues with pt & sister therefore pt missed 20 minutes of skilled PT treatment at beginning of session. PT returned later & pt on toilet with help from NT. Therapist assisted pt toilet>w/c with steady assist and use of grab bar. Pt performed hand hygiene from w/c level with set up assistance. Pt ambulated room>gym>dayroom>room without AD and min assist with more normalized gait pattern and more equal step length BLE. In gym pt utilized nu-step on level 3 x 10 minutes with all four extremities with task focusing on attention to task, coordination of reciprocal movements, and endurance training. Pt attended to task for full 10 minutes with supervision but did require moderate cuing at end to terminate task. In dayroom pt engaged in card matching and arranging days of week in correct order. Pt able to correctly match 15/18 cards without assistance and arranged days of week with only 25% cuing for accuracy. Task also focused on L attention & LUE NMR - pt more willing to use LUE with only verbal cuing on this date. Pt continues to require max cuing for orientation to place, time, and situation. At end of session pt returned to enclosure bed & was left with all needs within reach.   Treatment 2: Pt received in bed & agreeable to tx. Pt without c/o or behaviors noting pain. Pt transferred to sitting EOB & was able to don shoes with supervision and required assistance for properly donning helmet. Pt reported need to use  restroom and ambulated within room<>bathroom with min/mod assist for balance without AD. Pt attempted to urinate but unable to void; pt then completed hand hygiene standing at sink with cuing. Pt ambulated room>gym>dayroom>room with min/mod assist; pt experienced 1 loss of balance requiring total assist to prevent fall.  In gym pt negotiated 24 steps with B rails & +2 assist (pt = 50%) with impaired foot placement, requiring mod assist, and cuing for safety. Pt engaged in dynamic standing task involving kicking soccer ball. Pt required max cuing to maintain stable BOS and mod assist for balance. Pt also engaged in boxing with mod assist for balance and required multimodal cuing to alternate punching bag with BUE; pt fatigues quickly and requires frequent seated rest breaks. Pt utilized cybex kinetron in sitting with min assist to utilize LLE. Pt then engaged in peg board task but unable to replicate pattern despite max/total cuing with picture and after therapist demonstrated completed pattern. Pt also required max assist to attend to task of sorting pegs. Pt encouraged to use LUE to return pegs to bin and required HOH to not use RUE. Pt also threaded blocks on string with impaired use of LUE 2/2 apraxia. At end of session pt returned to room and left in bed with NT present to supervise.   Therapy Documentation Precautions:  Precautions Precautions: Fall, Other (comment) Precaution Comments: bone flap removed R side of head in abdomen; hemet for OOB; Pt no longer with NG or PICC; enclosure bed Required Braces or Orthoses: Other Brace/Splint Other Brace/Splint: helmet for OOB  Restrictions Weight Bearing Restrictions: No  General: PT Amount of Missed Time (min): 20 Minutes PT Missed Treatment Reason:  (MD care)  Pain: No behaviors or c/o pain.   See Function Navigator for Current Functional Status.   Therapy/Group: Individual Therapy  Sandi MariscalVictoria M Yzabella Crunk 03/09/2017, 3:59 PM

## 2017-03-09 NOTE — Discharge Instructions (Signed)
Inpatient Rehab Discharge Instructions  Jamie LeapMiguel Garcia Berg Discharge date and time: No discharge date for patient encounter.   Activities/Precautions/ Functional Status: Activity: activity as tolerated Diet: Dysphagia #1 thin liquids Wound Care: keep wound clean and dry Functional status:  ___ No restrictions     ___ Walk up steps independently ___ 24/7 supervision/assistance   ___ Walk up steps with assistance ___ Intermittent supervision/assistance  ___ Bathe/dress independently ___ Walk with walker     __x_ Bathe/dress with assistance ___ Walk Independently    ___ Shower independently ___ Walk with assistance    ___ Shower with assistance ___ No alcohol     ___ Return to work/school ________  Special Instructions:    My questions have been answered and I understand these instructions. I will adhere to these goals and the provided educational materials after my discharge from the hospital.  Patient/Caregiver Signature _______________________________ Date __________  Clinician Signature _______________________________________ Date __________  Please bring this form and your medication list with you to all your follow-up doctor's appointments.

## 2017-03-10 ENCOUNTER — Inpatient Hospital Stay (HOSPITAL_COMMUNITY): Payer: Self-pay | Admitting: Physical Therapy

## 2017-03-10 ENCOUNTER — Inpatient Hospital Stay (HOSPITAL_COMMUNITY): Payer: Self-pay | Admitting: Occupational Therapy

## 2017-03-10 ENCOUNTER — Inpatient Hospital Stay (HOSPITAL_COMMUNITY): Payer: Self-pay | Admitting: Speech Pathology

## 2017-03-10 LAB — GLUCOSE, CAPILLARY
GLUCOSE-CAPILLARY: 68 mg/dL (ref 65–99)
Glucose-Capillary: 64 mg/dL — ABNORMAL LOW (ref 65–99)
Glucose-Capillary: 88 mg/dL (ref 65–99)
Glucose-Capillary: 83 mg/dL (ref 65–99)
Glucose-Capillary: 110 mg/dL — ABNORMAL HIGH (ref 65–99)
Glucose-Capillary: 182 mg/dL — ABNORMAL HIGH (ref 65–99)

## 2017-03-10 NOTE — Progress Notes (Signed)
[place] PHYSICAL MEDICINE & REHABILITATION     PROGRESS NOTE    Subjective/Complaints: No complaints. Sugars low this morning 66.   ROS: pt denies nausea, vomiting, diarrhea, cough, shortness of breath or chest pain    Objective: Vital Signs: Blood pressure 120/80, pulse 88, temperature 98 F (36.7 C), temperature source Oral, resp. rate 18, height 5\' 2"  (1.575 m), weight 60.4 kg (133 lb 1.6 oz), SpO2 100 %. No results found.  Recent Labs  03/08/17 0721  WBC 4.4  HGB 11.5*  HCT 33.9*  PLT 238    Recent Labs  03/08/17 0721  NA 139  K 4.2  CL 105  GLUCOSE 93  BUN 13  CREATININE 0.67  CALCIUM 9.1   CBG (last 3)   Recent Labs  03/10/17 0659 03/10/17 0722 03/10/17 0745  GLUCAP 68 64* 88    Wt Readings from Last 3 Encounters:  03/07/17 60.4 kg (133 lb 1.6 oz)  02/01/17 65.6 kg (144 lb 9.6 oz)  07/31/16 68 kg (150 lb)    Physical Exam:    Gen: NAD. Well developed.  HENT:  Craniectomy scar  Cardiovascular: RRR Respiratory:  CTA B GI: Soft. Bowel sounds are normal. Neurological:   Spontaneously moving all 4 extremities. Engaging left side more. More attentive Psychiatric:   Flat but very cooperative, pleasant Skin. Warm and dry. Crani wound without drainage. Areas of scab decreased   Assessment/Plan: 1. Cognitive and functional deficits secondary to right traumatic ICH/SDH s/p craniectomy which require 3+ hours per day of interdisciplinary therapy in a comprehensive inpatient rehab setting. Physiatrist is providing close team supervision and 24 hour management of active medical problems listed below. Physiatrist and rehab team continue to assess barriers to discharge/monitor patient progress toward functional and medical goals.  Function:  Bathing Bathing position   Position: Shower  Bathing parts Body parts bathed by patient: Right arm, Left arm, Chest, Abdomen, Right upper leg, Left upper leg, Right lower leg, Left lower leg, Front perineal  area, Buttocks Body parts bathed by helper: Back  Bathing assist Assist Level: Touching or steadying assistance(Pt > 75%)      Upper Body Dressing/Undressing Upper body dressing   What is the patient wearing?: Pull over shirt/dress     Pull over shirt/dress - Perfomed by patient: Thread/unthread right sleeve, Thread/unthread left sleeve, Put head through opening, Pull shirt over trunk Pull over shirt/dress - Perfomed by helper: Pull shirt over trunk        Upper body assist Assist Level: Supervision or verbal cues   Set up : To obtain clothing/put away  Lower Body Dressing/Undressing Lower body dressing   What is the patient wearing?: Underwear, Pants, Socks, Shoes Underwear - Performed by patient: Thread/unthread right underwear leg, Thread/unthread left underwear leg, Pull underwear up/down   Pants- Performed by patient: Thread/unthread right pants leg, Thread/unthread left pants leg, Pull pants up/down Pants- Performed by helper: Pull pants up/down Non-skid slipper socks- Performed by patient: Don/doff left sock, Don/doff right sock Non-skid slipper socks- Performed by helper: Don/doff right sock, Don/doff left sock Socks - Performed by patient: Don/doff right sock, Don/doff left sock Socks - Performed by helper: Don/doff right sock, Don/doff left sock Shoes - Performed by patient: Don/doff right shoe, Don/doff left shoe, Fasten right, Fasten left Shoes - Performed by helper: Fasten right, Fasten left     TED Hose - Performed by patient: Don/doff right TED hose TED Hose - Performed by helper: Don/doff right TED hose, Don/doff left TED hose  Lower body assist Assist for lower body dressing: Touching or steadying assistance (Pt > 75%)      Toileting Toileting Toileting activity did not occur: Safety/medical concerns Toileting steps completed by patient: Adjust clothing prior to toileting, Performs perineal hygiene, Adjust clothing after toileting Toileting steps completed by  helper: Performs perineal hygiene Toileting Assistive Devices: Grab bar or rail  Toileting assist Assist level: Touching or steadying assistance (Pt.75%)   Transfers Chair/bed transfer Chair/bed transfer activity did not occur:  (standard chair to w/c) Chair/bed transfer method: Ambulatory Chair/bed transfer assist level: Touching or steadying assistance (Pt > 75%) Chair/bed transfer assistive device: Armrests Mechanical lift: Engineer, petroleum Ambulation activity did not occur: Safety/medical concerns   Max distance: 150 ft Assist level: Moderate assist (Pt 50 - 74%)   Wheelchair Wheelchair activity did not occur: Safety/medical concerns   Max wheelchair distance: 10 Assist Level: Dependent (Pt equals 0%)  Cognition Comprehension Comprehension assist level: Understands basic 50 - 74% of the time/ requires cueing 25 - 49% of the time  Expression Expression assist level: Expresses basic 75 - 89% of the time/requires cueing 10 - 24% of the time. Needs helper to occlude trach/needs to repeat words.  Social Interaction Social Interaction assist level: Interacts appropriately 75 - 89% of the time - Needs redirection for appropriate language or to initiate interaction.  Problem Solving Problem solving assist level: Solves basic 50 - 74% of the time/requires cueing 25 - 49% of the time  Memory Memory assist level: Recognizes or recalls 25 - 49% of the time/requires cueing 50 - 75% of the time  Medical Problem List and Plan: 1. Decreased functional mobility with cognitive deficitssecondary to right IPH/SDH/temporal bone fracture after fall S/P right frontal temporoparietal craniectomy 01/14/2017 with placement of bone flap in the abdomen  Cont CIR  -RLAS V-VI---improving awareness, still impulsive  2. DVT Prophylaxis/Anticoagulation: Subcutaneous heparin. Monitor platelet counts and any signs of bleeding. 3. Pain Management: Hycet  4. Mood: Ativan as needed 5. Neuropsych: This  patient is notcapable of making decisions on hisown behalf.  -HS seroquel for sleep/agitation, reduced to 25mg --decrease further to 12 today  -limiting benzos  -continue ritalin for arousal, attention and initiation-   15mg  bid  -enclosure bed for safety still required, renewed.  Continue d/t fall risk  -soft helmet for safety 6. Skin/Wound Care: Routine skin checks  -helmet for cranium protection  -flap site intact  -crani incision much improved---   -have contacted NS about potential cranioplasty 7. Fluids/Electrolytes/Nutrition:    -intake reasonable 8.Seizure prophylaxis. Keppra 500 mg twice daily. Hesitate to decrease further given craniectomy 9.Pneumonia. resolved 10.Dysphagia. On D1 thins. Taking meds without issues 11.Urinary retention/recent Escherichia coli UTI s/p course of Cipro.   - repeat urine culture with 100k coag neg staph--- macrobid completed  -wean urecholine to 25mg  TID--showing improvement this week    -continue timed voids, prn caths  -suspect cognitive/apraxic factors to emptying  12.Diabetes mellitus with peripheral neuropathy. Hemoglobin A1c 10.4.  Patient on Glucophage 500 mg twice a day and Glucotrol 5 mg daily at home  -lantus 15u qhs, glipizide 5 mg    13.History of hypertension. Patient on lisinopril 2.5 mg daily prior to admission. Resume if >130/85  Controlled 5/7   Vitals:   03/09/17 1427 03/10/17 0526  BP: 122/75 120/80  Pulse: 90 88  Resp: 18 18  Temp: 98 F (36.7 C) 98 F (36.7 C)   14.Constipation. Laxative assistance     LOS (Days) 37  A FACE TO FACE EVALUATION WAS PERFORMED  [name], MD 03/10/2017 8:47 AM

## 2017-03-10 NOTE — Progress Notes (Signed)
Occupational Therapy Session Note  Patient Details  Name: [name] MRN: [id number] Date of Birth: Dec 25, 1965  Today's Date: 03/10/2017 OT Individual Time: [id number] OT Individual Time Calculation (min): 59 min    Short Term Goals: Week 5:  OT Short Term Goal 1 (Week 5): STGs=LTGs due to ELOS  Skilled Therapeutic Interventions/Progress Updates:    Upon entering the room, pt supine in bed with no c/o pain and requesting to shower. Pt performed sit >stand from bed with min A. Pt ambulating with mod HHA into bathroom for toileting. Pt having BM and able to void urine. He initiated hygiene himself with steady assistance for safety. Pt transferred to shower for bathing task. Pt initially standing to wash UB and sitting to wash LB with min verbal cues for safety. Pt exiting the shower halfway through to return to toilet to continue to have BM and returned to shower for continued hygiene. Pt ambulating with mod A to chair next to dresser. Pt obtaining all needed clothing items with  Min verbal questioning cues and increased time. While dressing, pt donning 3 pairs of pants with max A for problem solving and initiation to remove extra clothing. Pt standing at sink for grooming with min A for standing balance and increased time to sequence and perform task but no cues needed. Pt ambulating in hallway with therapist attempting to navigate unit. Pt needing max cues for redirection with ambulation as he opened emergency exit door. Pt returned to room  and secured in enclosed bed at end of session.   Therapy Documentation Precautions:  Precautions Precautions: Fall, Other (comment) Precaution Comments: bone flap removed R side of head in abdomen; hemet for OOB; Pt no longer with NG or PICC; enclosure bed Required Braces or Orthoses: Other Brace/Splint Other Brace/Splint: helmet for OOB Restrictions Weight Bearing Restrictions: No   Pain: Pain Assessment Pain Assessment: No/denies  pain Pain Score: 0-No pain  See Function Navigator for Current Functional Status.   Therapy/Group: Individual Therapy  [name], Taray Normoyle P 03/10/2017, 10:48 AM

## 2017-03-10 NOTE — Progress Notes (Signed)
Physical Therapy Session Note  Patient Details  Name: Zyiere Rosemond MRN: [id number] Date of Birth: [birth year]  Today's Date: 03/10/2017 PT Individual Time: 1103-1201 and 1307-1430 PT Individual Time Calculation (min): 58 min and 83 min  Short Term Goals: Week 1:  PT Short Term Goal 1 (Week 1): Pt will initiate rolling L/R and supine<>sitting with max assist. PT Short Term Goal 1 - Progress (Week 1): Met PT Short Term Goal 2 (Week 1): Pt will transfer bed<>w/c with max assist +1. PT Short Term Goal 2 - Progress (Week 1): Not met PT Short Term Goal 3 (Week 1): Pt will demonstrate sustained attention in controlled environment for 2 minutes with supervision. PT Short Term Goal 3 - Progress (Week 1): Met (not consistently, but has met on one occasion)  Skilled Therapeutic Interventions/Progress Updates:  Treatment 1: Pt agreeable to tx session with sister [name]) present for hands-on training. [name] reports she "feels better" with hands-on assistance with pt following training on Monday. Session focused on d/c planning, caregiver education, hands-on caregiver training, and functional mobility. Discussed d/c plans with pt's sister and educating her on recommendation of 16x16 w/c for community mobility and to access home since pt has a very long sidewalk from parking lot to door. Therapist provided demonstration & [name] return demonstrated w/c parts management.  Therapist ambulated with pt with sister return demonstrating assisting pt. On this date pt is able to ambulate with min assist with therapist and when [name] assists pt therapist only provides steady assist for balance, which is an improvement from Monday. Pt also completed a car transfer from a low seat height with Min assist HHA. Therapist provided demonstration with [name] return demonstrating bumping pt up curb in w/c; [name] demonstrates good safety with task. [name] reports that pt will not want to take a sink bath  downstairs and will have to negotiate a full flight of stairs with a L rail only to access full bathroom on 2nd level of home.  Attempted to have pt negotiate stairs in gym with single L rail but pt very impulsive with task & unable to be redirected to stop or to hold one rail only. [name] reports from observing therapist assist pt up/down steps she does not feel comfortable even attempting this on this date. [name] then assisted pt back to bed in room with continued physical (steady) assist from therapist. [name] appears to fatigue quickly when helping pt ambulate. At end of session pt left in enclosure bed with all needs within reach & sister present in room. [name] inquired about pt swimming in pool during summer with therapist highly discouraging this, educating her on the overall decreased safety with this.   Treatment 2: Pt received in bed & agreeable to tx. Pt transferred to EOB and donned socks and shoes with supervision assistance. Session focused on dynamic balance, NMR, L attention, and cognitive remediation. Pt negotiated 2 full flights of stairs in stairwell with single L rail and +2 assist (pt = 50%) 2/2 impaired safety. Pt requires max cuing for redirection, safety with task, and hand/foot placement. 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 Pt negotiates stairs with slight increase in balance when LUE is supported on person's shoulder. Pt with poor eccentric control with descending stairs and requires max multimodal cuing for step-to instead of reciprocal pattern. Pt then ambulated with [facility] with min assist with intermittent correct use of AD, other times pt would simply carry cane. Pt then engaged in peg board  task requiring cuing 100% of the time for accuracy, problem solving, and error correction. Pt able to complete simple pattern of alternating colors on this date with 100% verbal cuing from therapist. Pt able to return all pegs to bin with LUE with only verbal cuing to use  extremity. Pt then retrieve 10 beans from yellow therapy putty with moderate cuing to hold putty in BUE. Pt unable to recall correct number of beans remaining in putty throughout task. Pt then arranged numbers in order to complete calendar for month of May. Pt required cuing 100% of the time for accuracy, error correction, and attention to L as he still neglects cards lying on his L side. At end of session pt returned to enclosure bed & left with all needs within reach.   Therapy Documentation Precautions:  Precautions Precautions: Fall, Other (comment) Precaution Comments: bone flap removed R side of head in abdomen; hemet for OOB; Pt no longer with NG or PICC; enclosure bed Required Braces or Orthoses: Other Brace/Splint Other Brace/Splint: helmet for OOB Restrictions Weight Bearing Restrictions: No  Pain: Both sessions: ptt denied c/o pain.   See Function Navigator for Current Functional Status.   Therapy/Group: Individual Therapy  [name] 03/10/2017, 3:42 PM

## 2017-03-10 NOTE — Progress Notes (Signed)
Hypoglycemic Event  CBG: 66  Treatment: 15 GM carbohydrate snack  Symptoms: Pale and None  Follow-up CBG: Time:0745 CBG Result:64 then 88 after breakfast  Possible Reasons for Event: medication regimen   Comments/MD notified:Dr. Riley KillSwartz notified as he was rounding.    Dani Gobbleeardon, Henok Heacock J

## 2017-03-10 NOTE — Progress Notes (Signed)
Social Work Patient ID: [name], male   DOB: Jul 03, 1966, 51 y.o.   MRN: [id number]   Met with pt and sister yesterday afternoon following team conference.  Sister was able to speak with Dr. Naaman Plummer yesterday morning as well.  She is aware that team recommends an extension of LOS to new target date of 5/18.  Explained that we also await input from neurosurgery about bone flap.  Requested that she continue to stay as involved in family education as she can and she agrees. Hopeful, with extension, that pt might be closer to a supervision/ min assist level and more consistent with this.  Continue to follow.  Charisse Wendell, LCSW

## 2017-03-10 NOTE — Progress Notes (Signed)
Speech Language Pathology Daily Session Note  Patient Details  Name: Loni BeckwithMiguel Garcia Cardenas MRN: [id number] Date of Birth: 1966/03/09  Today's Date: 03/10/2017 SLP Individual Time: 1000-1100 SLP Individual Time Calculation (min): 60 min  Short Term Goals: Week 6: SLP Short Term Goal 1 (Week 6): Patient will consume current diet with minimal overt s/s of aspiration with supervision verbal cues for use of swallowing compensatory strategies.  SLP Short Term Goal 2 (Week 6): Patient will demonstrate efficient mastication with mild oral residue and minimal overt s/s of aspiration with Mod A verbal cues over 2 sessions prior to upgrade.  SLP Short Term Goal 3 (Week 6): Patient will demonstrate selective attention to a functional task for ~10 minutes with Mod A verbal cues for redirection in a mildly distracting enviornment.  SLP Short Term Goal 4 (Week 6): Patient will orient to place, situation and time with Min  A multimodal cues.  SLP Short Term Goal 5 (Week 6): Patient will demonstrate basic problem solving for basic tasks with Mod A verbal cues.   Skilled Therapeutic Interventions: Skilled treatment session focused on dysphagia and cognitive goals. SLP facilitated session by administering trials of Dys. 2 textures. Patient demonstrated efficient mastication with mild oral residue that cleared with a cued liquid wash and Min verbal cues to attend to bolus. Patient appeared to tolerate trials better today than previous sessions, suspect due to clinician controlling liquid intake. Recommend trial tray at next scheduled session. Patient also participated in a verbal description task and required Mod-Max A verbal cues for verbal reasoning with task. Patient handed off to NT. Continue with current plan of care.      Function:  Eating Eating   Modified Consistency Diet: Yes Eating Assist Level: Set up assist for;Supervision or verbal cues;Helper checks for pocketed food            Cognition Comprehension Comprehension assist level: Understands basic 50 - 74% of the time/ requires cueing 25 - 49% of the time  Expression   Expression assist level: Expresses basic 75 - 89% of the time/requires cueing 10 - 24% of the time. Needs helper to occlude trach/needs to repeat words.  Social Interaction Social Interaction assist level: Interacts appropriately 75 - 89% of the time - Needs redirection for appropriate language or to initiate interaction.  Problem Solving Problem solving assist level: Solves basic 50 - 74% of the time/requires cueing 25 - 49% of the time  Memory Memory assist level: Recognizes or recalls 25 - 49% of the time/requires cueing 50 - 75% of the time    Pain Pain Assessment Pain Assessment: No/denies pain Pain Score: 0-No pain  Therapy/Group: Individual Therapy  Yarelis Ambrosino 03/10/2017, 11:24 AM

## 2017-03-11 ENCOUNTER — Inpatient Hospital Stay (HOSPITAL_COMMUNITY): Payer: Self-pay | Admitting: Speech Pathology

## 2017-03-11 ENCOUNTER — Inpatient Hospital Stay (HOSPITAL_COMMUNITY): Payer: Self-pay | Admitting: Physical Therapy

## 2017-03-11 ENCOUNTER — Ambulatory Visit (HOSPITAL_COMMUNITY): Payer: Self-pay | Admitting: Speech Pathology

## 2017-03-11 ENCOUNTER — Inpatient Hospital Stay (HOSPITAL_COMMUNITY): Payer: Self-pay | Admitting: Occupational Therapy

## 2017-03-11 LAB — GLUCOSE, CAPILLARY
GLUCOSE-CAPILLARY: 152 mg/dL — AB (ref 65–99)
GLUCOSE-CAPILLARY: 140 mg/dL — AB (ref 65–99)
Glucose-Capillary: 106 mg/dL — ABNORMAL HIGH (ref 65–99)
Glucose-Capillary: 139 mg/dL — ABNORMAL HIGH (ref 65–99)

## 2017-03-11 MED ORDER — BETHANECHOL CHLORIDE 25 MG PO TABS
25.0000 mg | ORAL_TABLET | Freq: Three times a day (TID) | ORAL | Status: DC
  Administered 2017-03-11 – 2017-03-14 (×11): 25 mg via ORAL
  Filled 2017-03-11 (×11): qty 1

## 2017-03-11 NOTE — Progress Notes (Signed)
Occupational Therapy Session Note  Patient Details  Name: Jamie BeckwithMiguel Garcia Cardenas MRN: [id number] Date of Birth: 07-17-1966  Today's Date: 03/11/2017 OT Individual Time: 1000-1100 OT Individual Time Calculation (min): 60 min    Short Term Goals: Week 5:  OT Short Term Goal 1 (Week 5): STGs=LTGs due to ELOS  Skilled Therapeutic Interventions/Progress Updates:    Upon entering the room, pt supine in bed with sister present for continued family education. Interpreter also present. Pt transferred from bed >wheelchair with min A this session. Caregiver propelled pt via wheelchair to ADL apartment for shower secondary to time management. OT educated and demonstrated transfer into tub shower with use of TTB. Caregiver then assisting pt onto TTB with mod HHA this session. Pt engaged in bathing with sister providing cues and assist for safety as needed. Pt standing multiple times to wash buttocks and peri area but min A for safety while holding onto grab bar. Pt also following cues from sister to sit back onto bench. Pt dressing from seated position in bathroom with cues and assist given from caregiver. Pt's sister needing encouragement and minimal cues for gesture and tactile cues. Sister providing mod hand held assistance for ambulation back into room and pt performing grooming tasks while standing at sink with steady assistance. Pt returning to vale bed upon exiting the room.  Therapy Documentation Precautions:  Precautions Precautions: Fall, Other (comment) Precaution Comments: bone flap removed R side of head in abdomen; hemet for OOB; Pt no longer with NG or PICC; enclosure bed Required Braces or Orthoses: Other Brace/Splint Other Brace/Splint: helmet for OOB Restrictions Weight Bearing Restrictions: No General:   Vital Signs:   Pain: Pain Assessment Pain Assessment: No/denies pain  See Function Navigator for Current Functional Status.   Therapy/Group: Individual Therapy  [name],  Oneida Mckamey P 03/11/2017, 12:53 PM

## 2017-03-11 NOTE — Progress Notes (Signed)
Physical Therapy Session Note  Patient Details  Name: Jamie BeckwithMiguel Garcia Cardenas MRN: [id number] Date of Birth: 11/14/1965  Today's Date: 03/11/2017 PT Individual Time: 805-900 and 1107-1131 and 1305-1400 PT Individual Time Calculation (min): 55 min and 24 min and 55 min  Short Term Goals: Week 6:  PT Short Term Goal 1 (Week 6): STG = LTG due to estimated d/c date.  Skilled Therapeutic Interventions/Progress Updates:  Treatment 1: Pt received in bed & agreeable to tx. Pt without c/o or behaviors demonstrating pain. Session focused on NMR via gait training and functional tasks. Pt ambulated throughout unit with min assist overall; pt with more normalized gait pattern but still demonstrates wide BOS. Pt utilized cybex kinetron in sitting with min assist and cuing for attention & use of LLE. Pt with difficulty terminating task with verbal cuing. Pt engaged in BUE coordination activities to address L attention and apraxia - pt shuffled cards and took caps on/off medicine bottles. Pt requires max verbal cuing and HOH assistance to hold bottle in RUE to use LUE to manage top. At end of session pt left in bed in room.   Treatment 2: Pt received in room with sister [name]([name]) present for session. Pt without c/o or behaviors demonstrating pain. Discussed potential of placing mattress on floor at home to decrease pt's fall risk out of bed. Educated [name] on use of gait belt with PT's recommendation that she use this to assist pt ambulate at home. Remainder of session focused on [name] assisting pt with ambulation with supervision from therapist but [name] requested assistance on one occasion 2/2 pt's balance worsening. Therapist educated [name] on need to direct pt and cue him to take seated rest breaks when his balance and anterior lean begins to worsen; [name] needs more practice with this. At end of session pt left sitting in w/c in room with sister present to supervise.   Treatment 3: Pt received in room  with sister present for hands on training. [name] requires cuing for w/c parts management, specifically locking brakes to allow pt safe sit<>stand transfer. [name] assisted pt with ambulation throughout unit with use of gait belt and min assist. [name] demonstrates improving ability to direct pt when to take rest breaks with continued cuing from therapist. Therapist did not have to provide hands on assistance today when [name] ambulated with pt. Pt utilized cybex kinetron with improving attention to LLE with less cuing. Educated pt on aquatic therapy (as she was asking about pt getting into the pool yesterday), as well as need for pt to have assistance from trained professional if cleared by MD to participate in aquatic therapy. Pt engaged in folding washcloths for improved BUE coordination and pt demonstrated good use of LUE with min cuing. [name] demonstrated ability to bump pt up curb in w/c without cuing/assist from therapist. At end of session pt left sitting in w/c in room with sister present to supervise.   Therapy Documentation Precautions:  Precautions Precautions: Fall, Other (comment) Precaution Comments: bone flap removed R side of head in abdomen; hemet for OOB; Pt no longer with NG or PICC; enclosure bed Required Braces or Orthoses: Other Brace/Splint Other Brace/Splint: helmet for OOB Restrictions Weight Bearing Restrictions: No   See Function Navigator for Current Functional Status.   Therapy/Group: Individual Therapy  Sandi MariscalVictoria M Finnlee Silvernail 03/11/2017, 7:52 PM

## 2017-03-11 NOTE — Plan of Care (Signed)
Problem: RH SKIN INTEGRITY Goal: RH STG SKIN FREE OF INFECTION/BREAKDOWN No new skin breakdown or infection while on rehab with min assist from staff.  Outcome: Progressing No skin infection or breakdown noted Goal: RH STG MAINTAIN SKIN INTEGRITY WITH ASSISTANCE STG Maintain Skin Integrity With Max Assistance.   Outcome: Progressing No other skin issues noted Goal: RH STG ABLE TO PERFORM INCISION/WOUND CARE W/ASSISTANCE STG Able To Perform Incision/Wound Care With max Assistance.   Outcome: Progressing Incision to head with 3 scabs painted with Betadine as ordered  Problem: RH SAFETY Goal: RH STG ADHERE TO SAFETY PRECAUTIONS W/ASSISTANCE/DEVICE STG Adhere to Safety Precautions With Max Assistance/Device.   Outcome: Progressing Safety precautions and fall preventions maintained, sister at bedside Goal: RH STG DECREASED RISK OF FALL WITH ASSISTANCE STG Decreased Risk of Fall With Max Assistance.   Outcome: Progressing No fall noted this shift

## 2017-03-11 NOTE — Progress Notes (Signed)
[facility] PHYSICAL MEDICINE & REHABILITATION     PROGRESS NOTE    Subjective/Complaints: Pt up with therapies. Sugars better this morning.   ROS: pt denies nausea, vomiting, diarrhea, cough, shortness of breath or chest pain    Objective: Vital Signs: Blood pressure 114/71, pulse 78, temperature 97.6 F (36.4 C), temperature source Oral, resp. rate 18, height 5\' 2"  (1.575 m), weight 60.8 kg (134 lb), SpO2 100 %. No results found. No results for input(s): WBC, HGB, HCT, PLT in the last 72 hours. No results for input(s): NA, K, CL, GLUCOSE, BUN, CREATININE, CALCIUM in the last 72 hours.  Invalid input(s): CO CBG (last 3)   Recent Labs  03/10/17 1635 03/10/17 2045 03/11/17 0623  GLUCAP 110* 182* 106*    Wt Readings from Last 3 Encounters:  03/10/17 60.8 kg (134 lb)  02/01/17 65.6 kg (144 lb 9.6 oz)  07/31/16 68 kg (150 lb)    Physical Exam:    Gen: NAD. Well developed.  HENT:  Craniectomy scar  Cardiovascular: RRR Respiratory:  CTA B GI: Soft. Bowel sounds are normal. Neurological:   Spontaneously moving all 4 extremities. Engages left side more. More attentive Psychiatric:   Flat but very cooperative, pleasant Skin. Warm and dry. Crani wound without drainage. Areas of scab decreasing   Assessment/Plan: 1. Cognitive and functional deficits secondary to right traumatic ICH/SDH s/p craniectomy which require 3+ hours per day of interdisciplinary therapy in a comprehensive inpatient rehab setting. Physiatrist is providing close team supervision and 24 hour management of active medical problems listed below. Physiatrist and rehab team continue to assess barriers to discharge/monitor patient progress toward functional and medical goals.  Function:  Bathing Bathing position   Position: Shower  Bathing parts Body parts bathed by patient: Right arm, Left arm, Chest, Abdomen, Right upper leg, Left upper leg, Right lower leg, Left lower leg, Front perineal area,  Buttocks Body parts bathed by helper: Back  Bathing assist Assist Level: Touching or steadying assistance(Pt > 75%)      Upper Body Dressing/Undressing Upper body dressing   What is the patient wearing?: Pull over shirt/dress     Pull over shirt/dress - Perfomed by patient: Thread/unthread right sleeve, Thread/unthread left sleeve, Put head through opening, Pull shirt over trunk Pull over shirt/dress - Perfomed by helper: Pull shirt over trunk        Upper body assist Assist Level: Supervision or verbal cues   Set up : To obtain clothing/put away  Lower Body Dressing/Undressing Lower body dressing   What is the patient wearing?: Underwear, Pants, Socks, Shoes Underwear - Performed by patient: Thread/unthread right underwear leg, Thread/unthread left underwear leg, Pull underwear up/down   Pants- Performed by patient: Thread/unthread right pants leg, Thread/unthread left pants leg, Pull pants up/down Pants- Performed by helper: Pull pants up/down Non-skid slipper socks- Performed by patient: Don/doff left sock, Don/doff right sock Non-skid slipper socks- Performed by helper: Don/doff right sock, Don/doff left sock Socks - Performed by patient: Don/doff right sock, Don/doff left sock Socks - Performed by helper: Don/doff right sock, Don/doff left sock Shoes - Performed by patient: Don/doff right shoe, Don/doff left shoe Shoes - Performed by helper: Fasten right, Fasten left     TED Hose - Performed by patient: Don/doff right TED hose TED Hose - Performed by helper: Don/doff right TED hose, Don/doff left TED hose  Lower body assist Assist for lower body dressing: Touching or steadying assistance (Pt > 75%)      Toileting Toileting  Toileting activity did not occur: Safety/medical concerns Toileting steps completed by patient: Adjust clothing prior to toileting, Performs perineal hygiene, Adjust clothing after toileting Toileting steps completed by helper: Performs perineal  hygiene Toileting Assistive Devices: Grab bar or rail  Toileting assist Assist level: Touching or steadying assistance (Pt.75%)   Transfers Chair/bed transfer Chair/bed transfer activity did not occur:  (standard chair to w/c) Chair/bed transfer method: Ambulatory Chair/bed transfer assist level: Touching or steadying assistance (Pt > 75%) Chair/bed transfer assistive device: Armrests Mechanical lift: Scientist, water quality activity did not occur: Safety/medical concerns   Max distance: 100 ft Assist level: Touching or steadying assistance (Pt > 75%)   Wheelchair Wheelchair activity did not occur: Safety/medical concerns   Max wheelchair distance: 10 Assist Level: Dependent (Pt equals 0%)  Cognition Comprehension Comprehension assist level: Understands basic 50 - 74% of the time/ requires cueing 25 - 49% of the time  Expression Expression assist level: Expresses basic 75 - 89% of the time/requires cueing 10 - 24% of the time. Needs helper to occlude trach/needs to repeat words.  Social Interaction Social Interaction assist level: Interacts appropriately 75 - 89% of the time - Needs redirection for appropriate language or to initiate interaction.  Problem Solving Problem solving assist level: Solves basic 50 - 74% of the time/requires cueing 25 - 49% of the time  Memory Memory assist level: Recognizes or recalls 25 - 49% of the time/requires cueing 50 - 75% of the time  Medical Problem List and Plan: 1. 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 DVT Prophylaxis/Anticoagulation: Subcutaneous heparin. Monitor platelet counts and any signs of bleeding. 3. Pain Management: Hycet  4. Mood: Ativan as needed 5. Neuropsych: This  patient is notcapable of making decisions on hisown behalf.  -HS seroquel for sleep/agitation, reduced to 25mg --decrease further to 12 today  -limiting benzos  -continue ritalin for arousal, attention and initiation-   15mg  bid  -enclosure bed for safety still required, renewed.  Continue d/t fall risk  -soft helmet for safety 6. Skin/Wound Care: Routine skin checks  -helmet for cranium protection  -flap site intact  -crani incision much improved---   -have contacted NS about potential cranioplasty---still awaiting follow up 7. Fluids/Electrolytes/Nutrition:    -intake reasonable 8.Seizure prophylaxis. Keppra 500 mg twice daily. Hesitate to decrease further given craniectomy 9.Pneumonia. resolved 10.Dysphagia. On D1 thins. Taking meds without issues 11.Urinary retention/recent Escherichia coli UTI s/p course of Cipro.   - repeat urine culture with 100k coag neg staph--- macrobid completed  -decreasing urecholine to 25mg  TID--showing improvement this week ---continue to wean   -continue timed voids, prn caths  -suspect cognitive/apraxic factors to emptying  12.Diabetes mellitus with peripheral neuropathy. Hemoglobin A1c 10.4.  Patient on Glucophage 500 mg twice a day and Glucotrol 5 mg daily at home  -lantus 15u qhs, glipizide 5 mg--tight control    13.History of hypertension. Patient on lisinopril 2.5 mg daily prior to admission. Resume if >130/85  Controlled 5/7   Vitals:   03/10/17 1443 03/11/17 0539  BP: 99/77 114/71  Pulse: 90 78  Resp: 18 18  Temp: 97.5 F (36.4 C) 97.6 F (36.4 C)   14.Constipation. Laxative assistance     LOS (Days) 38 A FACE TO FACE EVALUATION WAS PERFORMED  [name], MD 03/11/2017 9:09  AM

## 2017-03-11 NOTE — Progress Notes (Signed)
Speech Language Pathology Daily Session Note  Patient Details  Name: Jamie BeckwithMiguel Garcia Cardenas MRN: [id number] Date of Birth: 1966-01-20  Today's Date: 03/11/2017 SLP Individual Time: 1140-1205; [id number] SLP Individual Time Calculation (min): 25 min, 26 min   Short Term Goals: Week 6: SLP Short Term Goal 1 (Week 6): Patient will consume current diet with minimal overt s/s of aspiration with supervision verbal cues for use of swallowing compensatory strategies.  SLP Short Term Goal 2 (Week 6): Patient will demonstrate efficient mastication with mild oral residue and minimal overt s/s of aspiration with Mod A verbal cues over 2 sessions prior to upgrade.  SLP Short Term Goal 3 (Week 6): Patient will demonstrate selective attention to a functional task for ~10 minutes with Mod A verbal cues for redirection in a mildly distracting enviornment.  SLP Short Term Goal 4 (Week 6): Patient will orient to place, situation and time with Min  A multimodal cues.  SLP Short Term Goal 5 (Week 6): Patient will demonstrate basic problem solving for basic tasks with Mod A verbal cues.   Skilled Therapeutic Interventions: Session 1:  Pt was seen for skilled ST targeting cognitive and dysphagia goals.  Therapist facilitated the session with a trial meal tray of dys 2 textures to continue working towards diet progression.  Initially pt needed max assist/hand over hand cues for rate and portion control due to impulsivity; however, as meal progressed therapist was able to quickly fade cues to mod assist verbal cues for attention to clear boluses from the oral cavity.  Pt demonstrates limited insight into his oral dysphagia and repeatedly shook his head no when the therapist told him that he still had food in his mouth prior to taking another bite.  No overt s/s of aspiration were evident with solids or liquids.  Recommend diet advancement to dys 2 textures with ongoing needed for full supervision due to cognitive  impairments.  Sister was present and appropriately cued pt for use of swallowing precautions during meal.  Pt was left in enclosure bed with sister at bedside.  Continue per current plan of care.   Session 2:  Pt was seen for skilled ST targeting cognitive goals.  Pt needed mod-max assist verbal cues to reorient to place and situation.  Pt was able to identify safety problems in pictures with min assist-supervision question cues for 100% accuracy.   Pt selectively attended to a simple abstract reasoning inferential task with pictures cards in a mildly distracting environment for ~10 minutes with min verbal cues for redirection.  Upon returning to his room, pt needed mod-max assist verbal cues for safety due to impulsivity when transferring from bed to wheelchair.  Pt left in enclosure bed with sister at bedside.  Continue per current plan of care.    Function:  Eating Eating   Modified Consistency Diet: Yes Eating Assist Level: Set up assist for;Supervision or verbal cues;Helper checks for pocketed food   Eating Set Up Assist For: Opening containers       Cognition Comprehension Comprehension assist level: Understands basic 50 - 74% of the time/ requires cueing 25 - 49% of the time  Expression   Expression assist level: Expresses basic 75 - 89% of the time/requires cueing 10 - 24% of the time. Needs helper to occlude trach/needs to repeat words.  Social Interaction Social Interaction assist level: Interacts appropriately 75 - 89% of the time - Needs redirection for appropriate language or to initiate interaction.  Problem Solving Problem solving assist level:  Solves basic 50 - 74% of the time/requires cueing 25 - 49% of the time  Memory Memory assist level: Recognizes or recalls 25 - 49% of the time/requires cueing 50 - 75% of the time    Pain Pain Assessment Pain Assessment: No/denies pain  Therapy/Group: Individual Therapy  Ty Oshima, Melanee Spry 03/11/2017, 12:41 PM

## 2017-03-12 ENCOUNTER — Inpatient Hospital Stay (HOSPITAL_COMMUNITY): Payer: Self-pay | Admitting: Speech Pathology

## 2017-03-12 ENCOUNTER — Inpatient Hospital Stay (HOSPITAL_COMMUNITY): Payer: Self-pay | Admitting: Physical Therapy

## 2017-03-12 ENCOUNTER — Inpatient Hospital Stay (HOSPITAL_COMMUNITY): Payer: Self-pay | Admitting: Occupational Therapy

## 2017-03-12 LAB — GLUCOSE, CAPILLARY
GLUCOSE-CAPILLARY: 94 mg/dL (ref 65–99)
GLUCOSE-CAPILLARY: 112 mg/dL — AB (ref 65–99)
Glucose-Capillary: 72 mg/dL (ref 65–99)
Glucose-Capillary: 127 mg/dL — ABNORMAL HIGH (ref 65–99)

## 2017-03-12 NOTE — Plan of Care (Signed)
Problem: RH BOWEL ELIMINATION Goal: RH STG MANAGE BOWEL WITH ASSISTANCE STG Manage Bowel with mod  Assistance.    Outcome: Progressing No bm this shift  Problem: RH BLADDER ELIMINATION Goal: RH STG MANAGE BLADDER WITH ASSISTANCE STG Manage Bladder With Mod Assistance   Outcome: Progressing No bladder issues, voiding continently in the toilet  Problem: RH SKIN INTEGRITY Goal: RH STG SKIN FREE OF INFECTION/BREAKDOWN No new skin breakdown or infection while on rehab with min assist from staff.  Outcome: Progressing No skin breakdown noted  Problem: RH SAFETY Goal: RH STG ADHERE TO SAFETY PRECAUTIONS W/ASSISTANCE/DEVICE STG Adhere to Safety Precautions With Max Assistance/Device.   Outcome: Progressing Safety precautions maintained

## 2017-03-12 NOTE — Progress Notes (Signed)
[facility] PHYSICAL MEDICINE & REHABILITATION     PROGRESS NOTE    Subjective/Complaints: No new issues. Denies pain. Slept well. Good appetite  ROS: Limited due cognitive/behavioral    Objective: Vital Signs: Blood pressure 120/72, pulse 77, temperature 98.4 F (36.9 C), temperature source Oral, resp. rate 17, height 5\' 2"  (1.575 m), weight 60.7 kg (133 lb 12.8 oz), SpO2 99 %. No results found. No results for input(s): WBC, HGB, HCT, PLT in the last 72 hours. No results for input(s): NA, K, CL, GLUCOSE, BUN, CREATININE, CALCIUM in the last 72 hours.  Invalid input(s): CO CBG (last 3)   Recent Labs  03/11/17 1642 03/11/17 2118 03/12/17 0657  GLUCAP 152* 140* 72    Wt Readings from Last 3 Encounters:  03/12/17 60.7 kg (133 lb 12.8 oz)  02/01/17 65.6 kg (144 lb 9.6 oz)  07/31/16 68 kg (150 lb)    Physical Exam:    Gen: NAD. Well developed.  HENT:  Craniectomy scar  Cardiovascular: RRR Respiratory:  CTA B GI: Soft. Bowel sounds are normal. Neurological:   Spontaneously moving all 4 extremities. More attentive to left Psychiatric:   Flat but very cooperative, pleasant Skin. Warm and dry. Crani wound without drainage. Areas of scab diminishing   Assessment/Plan: 1. Cognitive and functional deficits secondary to right traumatic ICH/SDH s/p craniectomy which require 3+ hours per day of interdisciplinary therapy in a comprehensive inpatient rehab setting. Physiatrist is providing close team supervision and 24 hour management of active medical problems listed below. Physiatrist and rehab team continue to assess barriers to discharge/monitor patient progress toward functional and medical goals.  Function:  Bathing Bathing position   Position: Shower  Bathing parts Body parts bathed by patient: Right arm, Left arm, Chest, Abdomen, Right upper leg, Left upper leg, Right lower leg, Left lower leg, Front perineal area, Buttocks Body parts bathed by helper: Back   Bathing assist Assist Level: Touching or steadying assistance(Pt > 75%)      Upper Body Dressing/Undressing Upper body dressing   What is the patient wearing?: Pull over shirt/dress     Pull over shirt/dress - Perfomed by patient: Thread/unthread right sleeve, Thread/unthread left sleeve, Put head through opening, Pull shirt over trunk Pull over shirt/dress - Perfomed by helper: Pull shirt over trunk        Upper body assist Assist Level: Supervision or verbal cues   Set up : To obtain clothing/put away  Lower Body Dressing/Undressing Lower body dressing   What is the patient wearing?: Underwear, Pants, Socks, Shoes Underwear - Performed by patient: Thread/unthread right underwear leg, Thread/unthread left underwear leg, Pull underwear up/down   Pants- Performed by patient: Thread/unthread right pants leg, Thread/unthread left pants leg, Pull pants up/down Pants- Performed by helper: Pull pants up/down Non-skid slipper socks- Performed by patient: Don/doff left sock, Don/doff right sock Non-skid slipper socks- Performed by helper: Don/doff right sock, Don/doff left sock Socks - Performed by patient: Don/doff right sock, Don/doff left sock Socks - Performed by helper: Don/doff right sock, Don/doff left sock Shoes - Performed by patient: Don/doff right shoe, Don/doff left shoe Shoes - Performed by helper: Fasten right, Fasten left     TED Hose - Performed by patient: Don/doff right TED hose TED Hose - Performed by helper: Don/doff right TED hose, Don/doff left TED hose  Lower body assist Assist for lower body dressing: Touching or steadying assistance (Pt > 75%)      Toileting Toileting Toileting activity did not occur: Safety/medical concerns  Toileting steps completed by patient: Adjust clothing prior to toileting, Performs perineal hygiene Toileting steps completed by helper: Adjust clothing after toileting Toileting Assistive Devices: Grab bar or rail  Toileting assist  Assist level: Supervision or verbal cues   Transfers Chair/bed transfer Chair/bed transfer activity did not occur:  (standard chair to w/c) Chair/bed transfer method: Ambulatory Chair/bed transfer assist level: Touching or steadying assistance (Pt > 75%) Chair/bed transfer assistive device: Armrests Mechanical lift: Scientist, water quality activity did not occur: Safety/medical concerns   Max distance: 150 ft Assist level: Touching or steadying assistance (Pt > 75%)   Wheelchair Wheelchair activity did not occur: Safety/medical concerns   Max wheelchair distance: 10 Assist Level: Dependent (Pt equals 0%)  Cognition Comprehension Comprehension assist level: Understands basic 50 - 74% of the time/ requires cueing 25 - 49% of the time  Expression Expression assist level: Expresses basic 75 - 89% of the time/requires cueing 10 - 24% of the time. Needs helper to occlude trach/needs to repeat words.  Social Interaction Social Interaction assist level: Interacts appropriately 75 - 89% of the time - Needs redirection for appropriate language or to initiate interaction.  Problem Solving Problem solving assist level: Solves basic 50 - 74% of the time/requires cueing 25 - 49% of the time  Memory Memory assist level: Recognizes or recalls 25 - 49% of the time/requires cueing 50 - 75% of the time  Medical Problem List and Plan: 1. 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 DVT Prophylaxis/Anticoagulation: Subcutaneous heparin. Monitor platelet counts and any signs of bleeding. 3. Pain Management: Hycet  4. Mood: Ativan as needed 5. Neuropsych: This patient is notcapable of making decisions on hisown behalf.  -HS seroquel for  sleep/agitation, reduced to 25mg --decrease further to 12 today  -limiting benzos  -continue ritalin for arousal, attention and initiation-   15mg  bid  -enclosure bed for safety still required, renewed.  Continue d/t fall risk  -soft helmet for safety 6. Skin/Wound Care: Routine skin checks  -helmet for cranium protection  -flap site intact  -crani incision much improved---   -2 attempts to contact NS re: potential cranioplasty---await response 7. Fluids/Electrolytes/Nutrition:    -intake reasonable 8.Seizure prophylaxis. Keppra 500 mg twice daily. Hesitate to decrease further given craniectomy 9.Pneumonia. resolved 10.Dysphagia. On D1 thins. Taking meds without issues 11.Urinary retention/recent Escherichia coli UTI s/p course of Cipro.   - repeat urine culture with 100k coag neg staph--- macrobid completed  -decreasing urecholine to 25mg  TID--showing improvement this week ---continue to wean   -continue timed voids, prn caths  -suspect cognitive/apraxic factors to emptying  12.Diabetes mellitus with peripheral neuropathy. Hemoglobin A1c 10.4.  Patient on Glucophage 500 mg twice a day and Glucotrol 5 mg daily at home  -lantus 15u qhs, glipizide 5 mg--tight control    13.History of hypertension. Patient on lisinopril 2.5 mg daily prior to admission. Resume if >130/85  Controlled 5/7   Vitals:   03/11/17 1316 03/12/17 0502  BP: 113/66 120/72  Pulse: 95 77  Resp: 18 17  Temp: 98.6 F (37 C) 98.4 F (36.9 C)   14.Constipation. Laxative assistance     LOS (Days) 39 A FACE TO FACE EVALUATION WAS PERFORMED  [name], MD 03/12/2017 9:39 AM

## 2017-03-12 NOTE — Progress Notes (Signed)
Occupational Therapy Session Note  Patient Details  Name: [name] MRN: [id number] Date of Birth: 05-10-1966  Today's Date: 03/12/2017 OT Individual Time: [id number] and [id number] OT Individual Time Calculation (min): 62 min and 48 min   Skilled Therapeutic Interventions/Progress Updates:  Tx focus on ADL retraining, cognitive remediation, and functional ambulation without AD.   Pt lying in enclosure bed at time of arrival, agreeable to shower. Caregiver [name] was not present. Pt ambulated with Mod A HHA to shower (with helmet donned(). Pt required mod questioning cues to initiate clothing removal while on tub bench. Pt requiring min instructional cues for attending to all body areas during bathing. Questioning cues provided for appropriate use of shower controls and hand held shower hose. Min A sit<stand with grab bars. Afterwards pt gathered clothing items from dresser (ambulating with HHA). Pt gathering too many clothing articles, instructional cues for correcting. Pt dressed at EOB. He donned Ted stocking over gripper sock and required instructional cues to correct with extra time for self problem solving. With 1 cue to recall previous instruction, pt able to correct the same mistake with his other Ted hose. Pt still exhibits organization deficits when presented with multiple clothing articles and requires 1 item at a time to be presented to reduce L UE nonpurposeful involvement and confusion. Pt ambulated to sink to complete oral care, cues for termination of tasks with tactile cues. At end of tx pt returned to secure enclosure bed and was left with all needs within reach.   Interpretor present during tx.     2nd Session 1:1 tx (48 min) Tx focus on dynamic balance, orientation, and attention in moderately stimulating environment.   Pt received via PT handoff with interpretor and sister present. Discussed DME needs with sister [name] and provided therapeutic listening in  regards to the challenges resulting from her new caregiver role. [name] provided with encouragement and emotional support. She left after this discussion. Pt agreeable to complete meaningful leisure activity with OT. Pt ambulated with Mod A to dayroom (with helmet donned). Had him engage in soccer dribbling/shooting ball into goal. Pt stooping down to retrieve ball PRN and transitioning from sit<stand in chair without armrests and Min A. He required cues for attending to task with 6 other individuals present in room (for therapy sessions). Pt suddenly ambulating to door beside family room, "freezing" and shuffling LEs, unable to state what he was trying to do. With calming cues, pt guided into family room and transferred to couch. Pt oriented to year, place (even name of hospital) and general time with use of standard clock on wall ("almost noon"). Pt requiring cues for exact time, stating it was 11:55 instead of 11:45. Pt able to interact with OT while 3 others in room. Pt suddenly stood up and began walking in direction of gym, "freezing" beside s wall in busy hospital hallway. With calming cues, pt directed back to room. For rest of treatment pt engaged in ball pass/dribbling with interpretor while OT provided Mod A for balance. Pts engagement/attention markedly improved due to decrease in stimulation. At end of session pt returned to secure enclosure bed and left with all needs within reach.   Therapy Documentation Precautions:  Precautions Precautions: Fall, Other (comment) Precaution Comments: bone flap removed R side of head in abdomen; hemet for OOB; Pt no longer with NG or PICC; enclosure bed Required Braces or Orthoses: Other Brace/Splint Other Brace/Splint: helmet for OOB Restrictions Weight Bearing Restrictions: No   Pain: Pt reports "  no pain" when asked in tx    ADL:       See Function Navigator for Current Functional Status.   Therapy/Group: Individual Therapy  [name] A  [name] 03/12/2017, 11:06 AM

## 2017-03-12 NOTE — Progress Notes (Signed)
Speech Language Pathology Daily Session Note  Patient Details  Name: [name] MRN: [id number] Date of Birth: 06-Aug-1966  Today's Date: 03/12/2017 SLP Individual Time: 1300-1400 SLP Individual Time Calculation (min): 60 min  Short Term Goals: Week 6: SLP Short Term Goal 1 (Week 6): Patient will consume current diet with minimal overt s/s of aspiration with supervision verbal cues for use of swallowing compensatory strategies.  SLP Short Term Goal 2 (Week 6): Patient will demonstrate efficient mastication with mild oral residue and minimal overt s/s of aspiration with Mod A verbal cues over 2 sessions prior to upgrade.  SLP Short Term Goal 3 (Week 6): Patient will demonstrate selective attention to a functional task for ~10 minutes with Mod A verbal cues for redirection in a mildly distracting enviornment.  SLP Short Term Goal 4 (Week 6): Patient will orient to place, situation and time with Min  A multimodal cues.  SLP Short Term Goal 5 (Week 6): Patient will demonstrate basic problem solving for basic tasks with Mod A verbal cues.   Skilled Therapeutic Interventions: Skilled treatment session focused on dysphagia goals. SLP facilitated session by providing Mod A verbal cues for initiation to scoot EOB and transfer to wheelchair. Patient appeared more alert this session with increased social engagement but appeared more confused this session. Patient consumed lunch meal of Dys. 2 textures with thin liquids without overt s/s of aspiration but required Max A verbal cues for rate and portion control. Patient also participated in a basic money management task and required Mod A verbal cues for functional problem solving and Min A verbal cues for selective attention in a mildly distracting environment to self-feeding and cognitive tasks for ~50 minutes. Patient left upright in wheelchair. Continue with current plan of care.       Function:  Eating Eating   Modified Consistency Diet:  Yes Eating Assist Level: Set up assist for;Supervision or verbal cues;Helper checks for pocketed food   Eating Set Up Assist For: Opening containers       Cognition Comprehension Comprehension assist level: Understands basic 50 - 74% of the time/ requires cueing 25 - 49% of the time  Expression   Expression assist level: Expresses basic 75 - 89% of the time/requires cueing 10 - 24% of the time. Needs helper to occlude trach/needs to repeat words.  Social Interaction Social Interaction assist level: Interacts appropriately 75 - 89% of the time - Needs redirection for appropriate language or to initiate interaction.  Problem Solving Problem solving assist level: Solves basic 25 - 49% of the time - needs direction more than half the time to initiate, plan or complete simple activities  Memory Memory assist level: Recognizes or recalls 25 - 49% of the time/requires cueing 50 - 75% of the time    Pain No/Denies Pain   Therapy/Group: Individual Therapy  Riya Huxford 03/12/2017, 3:07 PM

## 2017-03-12 NOTE — Progress Notes (Signed)
Physical Therapy Note  Patient Details  Name: Loni BeckwithMiguel Garcia Cardenas MRN: [id number] Date of Birth: 12/08/1965 Today's Date: 03/12/2017    Time: 1400-1425 25 minutes  1:1 No c/o pain.  Gait throughout unit with min A, min/mod cuing for obstacle negotiation.  Stair negotiation x 1 flight with max A for problem solving, safety and eccentric control.  Step ups to 4'' step repetitions 2 x 10 reps with mod A.  Nu step x 8 minutes with cues for attention to task.  Pt improving balance with gait, continues with needing max cues for safety awareness.   Anirudh Baiz 03/12/2017, 2:32 PM

## 2017-03-12 NOTE — Progress Notes (Signed)
Physical Therapy Session Note  Patient Details  Name: [name] MRN: [id number] Date of Birth: 1966/06/01  Today's Date: 03/12/2017 PT Individual Time: [id number] PT Individual Time Calculation (min): 70 min   Short Term Goals: Week 6:  PT Short Term Goal 1 (Week 6): STG = LTG due to estimated d/c date.  Skilled Therapeutic Interventions/Progress Updates:  Pt received in room with [name] present for family training.  Session focused on gait and transfers with sister providing hands-on assistance, stair negotiating, NMR, and cognitive remediation. [name] required cuing to have pt don helmet before ambulation. [name] assisted pt with ambulation throughout unit with only supervision from therapist. [name] required less cuing on this date to direct pt when to take rest breaks. Pt with increasing forward lean and gait speed with fatigue as pt is eager to sit down. Pt negotiated 12 steps in stairwell with 1 rail and max assist from therapist. Pt requires intermittent cuing for step-to pattern instead of reciprocal pattern. Pt also requires placement of LUE on therapist's shoulder/arm or pt gets distracted by extremity. Pt utiluzed nu-step on level 3 x 14 minutes with all 4 extremities with task focusing on coordination of reciprocal movements and endurance training. Pt unable to attend to minutes of activity and unable to terminate task therefore required max multimodal cuing. Pt engaged in peg board task with slightly improving ability to recreate pattern in picture but pt still requires max cuing for correct pattern, shape, and error correction. At end of session pt left in handoff to OT.  Continued to discuss home modifications with [name] to increase pt's safety upon d/c.    Therapy Documentation Precautions:  Precautions Precautions: Fall, Other (comment) Precaution Comments: bone flap removed R side of head in abdomen; hemet for OOB; Pt no longer with NG or PICC; enclosure  bed Required Braces or Orthoses: Other Brace/Splint Other Brace/Splint: helmet for OOB Restrictions Weight Bearing Restrictions: No  Pain: Denies c/o pain.   See Function Navigator for Current Functional Status.   Therapy/Group: Individual Therapy  [name] 03/12/2017, 12:07 PM

## 2017-03-13 ENCOUNTER — Inpatient Hospital Stay (HOSPITAL_COMMUNITY): Payer: Self-pay | Admitting: Physical Therapy

## 2017-03-13 LAB — GLUCOSE, CAPILLARY
GLUCOSE-CAPILLARY: 140 mg/dL — AB (ref 65–99)
GLUCOSE-CAPILLARY: 97 mg/dL (ref 65–99)
Glucose-Capillary: 127 mg/dL — ABNORMAL HIGH (ref 65–99)
Glucose-Capillary: 156 mg/dL — ABNORMAL HIGH (ref 65–99)

## 2017-03-13 NOTE — Progress Notes (Signed)
[facility] PHYSICAL MEDICINE & REHABILITATION     PROGRESS NOTE    Subjective/Complaints: In bed. No complaints.   ROS: pt denies nausea, vomiting, diarrhea, cough, shortness of breath or chest pain    Objective: Vital Signs: Blood pressure 112/79, pulse 84, temperature 98.4 F (36.9 C), temperature source Oral, resp. rate 18, height 5\' 2"  (1.575 m), weight 60.7 kg (133 lb 12.8 oz), SpO2 99 %. No results found. No results for input(s): WBC, HGB, HCT, PLT in the last 72 hours. No results for input(s): NA, K, CL, GLUCOSE, BUN, CREATININE, CALCIUM in the last 72 hours.  Invalid input(s): CO CBG (last 3)   Recent Labs  03/12/17 1636 03/12/17 2139 03/13/17 0551  GLUCAP 112* 127* 140*    Wt Readings from Last 3 Encounters:  03/12/17 60.7 kg (133 lb 12.8 oz)  02/01/17 65.6 kg (144 lb 9.6 oz)  07/31/16 68 kg (150 lb)    Physical Exam:    Gen: NAD. Well developed.  HENT:  Craniectomy scar  Cardiovascular: RRR Respiratory:  CTA B GI: Soft. Bowel sounds are normal. Neurological:   Spontaneously moving all 4 extremities. More attentive to left Psychiatric:   Flat but very cooperative, pleasant Skin. Warm and dry. Crani wound without drainage. Areas of scab diminishing   Assessment/Plan: 1. Cognitive and functional deficits secondary to right traumatic ICH/SDH s/p craniectomy which require 3+ hours per day of interdisciplinary therapy in a comprehensive inpatient rehab setting. Physiatrist is providing close team supervision and 24 hour management of active medical problems listed below. Physiatrist and rehab team continue to assess barriers to discharge/monitor patient progress toward functional and medical goals.  Function:  Bathing Bathing position   Position: Shower  Bathing parts Body parts bathed by patient: Right arm, Left arm, Chest, Abdomen, Right upper leg, Left upper leg, Right lower leg, Left lower leg, Front perineal area, Buttocks Body parts bathed by  helper: Back  Bathing assist Assist Level: Touching or steadying assistance(Pt > 75%)      Upper Body Dressing/Undressing Upper body dressing   What is the patient wearing?: Pull over shirt/dress     Pull over shirt/dress - Perfomed by patient: Thread/unthread right sleeve, Thread/unthread left sleeve, Put head through opening, Pull shirt over trunk Pull over shirt/dress - Perfomed by helper: Pull shirt over trunk        Upper body assist Assist Level: Supervision or verbal cues   Set up : To obtain clothing/put away  Lower Body Dressing/Undressing Lower body dressing   What is the patient wearing?: Underwear, Pants, [employer], Shoes Underwear - Performed by patient: Thread/unthread right underwear leg, Thread/unthread left underwear leg, Pull underwear up/down   Pants- Performed by patient: Thread/unthread right pants leg, Thread/unthread left pants leg, Pull pants up/down Pants- Performed by helper: Pull pants up/down Non-skid slipper socks- Performed by patient: Don/doff left sock, Don/doff right sock Non-skid slipper socks- Performed by helper: Don/doff right sock, Don/doff left sock Socks - Performed by patient: Don/doff right sock, Don/doff left sock Socks - Performed by helper: Don/doff right sock, Don/doff left sock Shoes - Performed by patient: Don/doff right shoe, Don/doff left shoe, Fasten right, Fasten left Shoes - Performed by helper: Fasten right, Fasten left     TED Hose - Performed by patient: Don/doff right TED hose TED Hose - Performed by helper: Don/doff right TED hose, Don/doff left TED hose  Lower body assist Assist for lower body dressing: Touching or steadying assistance (Pt > 75%)  Toileting Toileting Toileting activity did not occur: Safety/medical concerns Toileting steps completed by patient: Adjust clothing prior to toileting, Performs perineal hygiene Toileting steps completed by helper: Adjust clothing after toileting Toileting Assistive  Devices: Grab bar or rail  Toileting assist Assist level: Supervision or verbal cues   Transfers Chair/bed transfer Chair/bed transfer activity did not occur:  (standard chair to w/c) Chair/bed transfer method: Ambulatory Chair/bed transfer assist level: Touching or steadying assistance (Pt > 75%) Chair/bed transfer assistive device: Armrests Mechanical lift: Scientist, water quality activity did not occur: Safety/medical concerns   Max distance: >150 ft Assist level: Touching or steadying assistance (Pt > 75%)   Wheelchair Wheelchair activity did not occur: Safety/medical concerns   Max wheelchair distance: 10 Assist Level: Dependent (Pt equals 0%)  Cognition Comprehension Comprehension assist level: Understands basic 50 - 74% of the time/ requires cueing 25 - 49% of the time  Expression Expression assist level: Expresses basic 75 - 89% of the time/requires cueing 10 - 24% of the time. Needs helper to occlude trach/needs to repeat words.  Social Interaction Social Interaction assist level: Interacts appropriately 75 - 89% of the time - Needs redirection for appropriate language or to initiate interaction.  Problem Solving Problem solving assist level: Solves basic 25 - 49% of the time - needs direction more than half the time to initiate, plan or complete simple activities  Memory Memory assist level: Recognizes or recalls 25 - 49% of the time/requires cueing 50 - 75% of the time  Medical Problem List and Plan: 1. 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 DVT Prophylaxis/Anticoagulation: Subcutaneous heparin. Monitor platelet counts and any signs of bleeding. 3. Pain Management: Hycet  4. Mood: Ativan as needed 5. Neuropsych: This patient is notcapable of making  decisions on hisown behalf.  -HS seroquel for sleep/agitation, reduced to 25mg --decrease further to 12 today  -limiting benzos  -continue ritalin for arousal, attention and initiation-   15mg  bid  -enclosure bed for safety still required, renewed.  Continue d/t fall risk  -soft helmet for safety 6. Skin/Wound Care: Routine skin checks  -helmet for cranium protection  -flap site intact  -crani incision much improved---only minor scabs   -3 attempts to contact NS re: potential cranioplasty---await response 7. Fluids/Electrolytes/Nutrition:    -intake reasonable 8.Seizure prophylaxis. Keppra 500 mg twice daily. Hesitate to decrease further given craniectomy 9.Pneumonia. resolved 10.Dysphagia. On D1 thins. Taking meds without issues 11.Urinary retention/recent Escherichia coli UTI s/p course of Cipro.   - repeat urine culture with 100k coag neg staph--- macrobid completed  -decreasing urecholine to 25mg  TID--showing improvement this week ---continue to wean   -continue timed voids, prn caths  -suspect cognitive/apraxic factors to emptying  12.Diabetes mellitus with peripheral neuropathy. Hemoglobin A1c 10.4.  Patient on Glucophage 500 mg twice a day and Glucotrol 5 mg daily at home  -lantus 15u qhs, glipizide 5 mg--tight control    13.History of hypertension. Patient on lisinopril 2.5 mg daily prior to admission. Resume if >130/85  Controlled 5/7   Vitals:   03/12/17 1300 03/13/17 0458  BP: (!) 146/95 112/79  Pulse: 90 84  Resp: (!) 21 18  Temp: 98.6 F (37 C) 98.4 F (36.9 C)   14.Constipation. Laxative assistance     LOS (Days) 40 A FACE TO FACE EVALUATION WAS PERFORMED  Ranelle OysterSWARTZ,Tayvia Faughnan T, MD 03/13/2017 9:37 AM

## 2017-03-13 NOTE — Progress Notes (Signed)
Physical Therapy Session Note  Patient Details  Name: Jamie BeckwithMiguel Garcia Cardenas MRN: [id number] Date of Birth: December 05, 1965  Today's Date: 03/13/2017 PT Individual Time: 0803-0900 PT Individual Time Calculation (min): 57 min   Short Term Goals: Week 6:  PT Short Term Goal 1 (Week 6): STG = LTG due to estimated d/c date.  Skilled Therapeutic Interventions/Progress Updates: Pt presented in bed per interpreter needs to use toilet. Ambulated to toilet pt stood for urinary void. Returned pt to recliner chair as sister entered room. Per nsg pt has not eaten breakfast sister was able to supervise and provide appropriate cues for eating breaking (chewing, swallowing, pacing). Sister requesting if pt can take shower. PTA provided close supervision while pt ambulated to bathroom with sister providing HHA for gait and cueing pt for incline. Sister provided appropriate multimodal cues for safety and completion of task during bathing with PTA present. Pt with x 1 LOB while standing in shower (for placement of towel on bench) with sister providing modA for regaining balance. Supervision for clothing management in sitting no LOB. After showering pt ambulated around unit with sister providing HHA and guarding at shoulder to maintain slower/safe gait speed. Pt returned to room and returned to bed with family present     Therapy Documentation Precautions:  Precautions Precautions: Fall, Other (comment) Precaution Comments: bone flap removed R side of head in abdomen; hemet for OOB; Pt no longer with NG or PICC; enclosure bed Required Braces or Orthoses: Other Brace/Splint Other Brace/Splint: helmet for OOB Restrictions Weight Bearing Restrictions: No General:   Vital Signs:   Pain: Pain Assessment Pain Assessment: No/denies pain Mobility:   Locomotion :    Trunk/Postural Assessment :    Balance:   Exercises:   Other Treatments:     See Function Navigator for Current Functional  Status.   Therapy/Group: Individual Therapy  Tavyn Kurka 03/13/2017, 10:20 AM

## 2017-03-13 NOTE — Plan of Care (Signed)
Problem: RH BLADDER ELIMINATION Goal: RH STG MANAGE BLADDER WITH MEDICATION WITH ASSISTANCE STG Manage Bladder With Medication With Mod Assistance.   Outcome: Progressing Voiding without difficulties  Problem: RH SKIN INTEGRITY Goal: RH STG SKIN FREE OF INFECTION/BREAKDOWN No new skin breakdown or infection while on rehab with min assist from staff.  Outcome: Progressing No s/s of infection or skin breakdown Goal: RH STG ABLE TO PERFORM INCISION/WOUND CARE W/ASSISTANCE STG Able To Perform Incision/Wound Care With max Assistance.   Outcome: Not Progressing Unable to perform own skin care  Problem: RH SAFETY Goal: RH STG ADHERE TO SAFETY PRECAUTIONS W/ASSISTANCE/DEVICE STG Adhere to Safety Precautions With Max Assistance/Device.   Outcome: Progressing Safety precautions maitained Goal: RH STG DECREASED RISK OF FALL WITH ASSISTANCE STG Decreased Risk of Fall With Max Assistance.   Outcome: Progressing On closure bed, assisted to BR with one person  Problem: RH PAIN MANAGEMENT Goal: RH STG PAIN MANAGED AT OR BELOW PT'S PAIN GOAL <2  Outcome: Progressing Denies pain

## 2017-03-14 ENCOUNTER — Inpatient Hospital Stay (HOSPITAL_COMMUNITY): Payer: Self-pay | Admitting: Occupational Therapy

## 2017-03-14 LAB — GLUCOSE, CAPILLARY
GLUCOSE-CAPILLARY: 109 mg/dL — AB (ref 65–99)
GLUCOSE-CAPILLARY: 136 mg/dL — AB (ref 65–99)
Glucose-Capillary: 143 mg/dL — ABNORMAL HIGH (ref 65–99)
Glucose-Capillary: 117 mg/dL — ABNORMAL HIGH (ref 65–99)

## 2017-03-14 NOTE — Progress Notes (Signed)
Occupational Therapy Session Note  Patient Details  Name: Loni BeckwithMiguel Garcia Cardenas MRN: [id number] Date of Birth: Aug 25, 1966  Today's Date: 03/14/2017 OT Individual Time: 0957-1100 OT Individual Time Calculation (min): 63 min   Skilled Therapeutic Interventions/Progress Updates:  Tx focus on caregiver training and neuro-re ed.   Pt greeted in secure enclosure bed. Pt reporting that he is seeing dead children in room and that they are speaking to him. When asked if children were friendly, pt reported "no." Per pt, they are not instructing him to do anything. OT oriented pt to reality, reinforced that visual hallucinations were not real and to not listen to them. He verbalized understanding. RN and sister made aware.  For remainder of tx, pt participated bathing/dressing at shower level with [name] spouse Janne Lab(Adolfo) providing hands on assist. Education provided on transfer safety, tactile cues, reinforcing pts active problem solving, and minimizing environmental distraction/stimulation. Janne Labdolfo was also instructed on technique for w/c parts mgt and assisted with toilet transfers/toileting. [name] also present and providing appropriate cueing to facilitate pts independence. Education to continue. Pt was left in w/c with safety belt/helmet  donned and family present.   Pt oriented to time of day, place (hospital), and self only.     Interpretor present throughout tx.   Therapy Documentation Precautions:  Precautions Precautions: Fall, Other (comment) Precaution Comments: bone flap removed R side of head in abdomen; hemet for OOB; Pt no longer with NG or PICC; enclosure bed Required Braces or Orthoses: Other Brace/Splint Other Brace/Splint: helmet for OOB Restrictions Weight Bearing Restrictions: No   Pain: No c/o pain during tx    ADL:      See Function Navigator for Current Functional Status.   Therapy/Group: Individual Therapy  Demisha Nokes A Eugune Sine 03/14/2017, 4:06 PM

## 2017-03-14 NOTE — Progress Notes (Signed)
[facility] PHYSICAL MEDICINE & REHABILITATION     PROGRESS NOTE    Subjective/Complaints: Up in bed. Awake. No new complaints  ROS: Limited due cognitive/behavioral   Objective: Vital Signs: Blood pressure 109/69, pulse 87, temperature 98.2 F (36.8 C), temperature source Oral, resp. rate 18, height 5\' 2"  (1.575 m), weight 60.7 kg (133 lb 12.8 oz), SpO2 99 %. No results found. No results for input(s): WBC, HGB, HCT, PLT in the last 72 hours. No results for input(s): NA, K, CL, GLUCOSE, BUN, CREATININE, CALCIUM in the last 72 hours.  Invalid input(s): CO CBG (last 3)   Recent Labs  03/13/17 1658 03/13/17 2154 03/14/17 0648  GLUCAP 127* 156* 143*    Wt Readings from Last 3 Encounters:  03/12/17 60.7 kg (133 lb 12.8 oz)  02/01/17 65.6 kg (144 lb 9.6 oz)  07/31/16 68 kg (150 lb)    Physical Exam:    Gen: NAD. Well developed.  HENT:  Craniectomy scar  Cardiovascular:  RRR Respiratory:  CTA B GI: Soft. Bowel sounds are normal. Neurological:   Spontaneously moving all 4 extremities. More attentive to left Psychiatric:   Flat but very cooperative, pleasant Skin. Warm and dry. Crani wound without drainage. Areas of scab diminishing   Assessment/Plan: 1. Cognitive and functional deficits secondary to right traumatic ICH/SDH s/p craniectomy which require 3+ hours per day of interdisciplinary therapy in a comprehensive inpatient rehab setting. Physiatrist is providing close team supervision and 24 hour management of active medical problems listed below. Physiatrist and rehab team continue to assess barriers to discharge/monitor patient progress toward functional and medical goals.  Function:  Bathing Bathing position   Position: Shower  Bathing parts Body parts bathed by patient: Right arm, Left arm, Chest, Abdomen, Right upper leg, Left upper leg, Right lower leg, Left lower leg, Front perineal area, Buttocks Body parts bathed by helper: Back  Bathing assist Assist  Level: Touching or steadying assistance(Pt > 75%)      Upper Body Dressing/Undressing Upper body dressing   What is the patient wearing?: Pull over shirt/dress     Pull over shirt/dress - Perfomed by patient: Thread/unthread right sleeve, Thread/unthread left sleeve, Put head through opening, Pull shirt over trunk Pull over shirt/dress - Perfomed by helper: Pull shirt over trunk        Upper body assist Assist Level: Supervision or verbal cues   Set up : To obtain clothing/put away  Lower Body Dressing/Undressing Lower body dressing   What is the patient wearing?: Underwear, Pants, [employer], Shoes Underwear - Performed by patient: Thread/unthread right underwear leg, Thread/unthread left underwear leg, Pull underwear up/down   Pants- Performed by patient: Thread/unthread right pants leg, Thread/unthread left pants leg, Pull pants up/down Pants- Performed by helper: Pull pants up/down Non-skid slipper socks- Performed by patient: Don/doff left sock, Don/doff right sock Non-skid slipper socks- Performed by helper: Don/doff right sock, Don/doff left sock Socks - Performed by patient: Don/doff right sock, Don/doff left sock Socks - Performed by helper: Don/doff right sock, Don/doff left sock Shoes - Performed by patient: Don/doff right shoe, Don/doff left shoe, Fasten right, Fasten left Shoes - Performed by helper: Fasten right, Fasten left     TED Hose - Performed by patient: Don/doff right TED hose TED Hose - Performed by helper: Don/doff right TED hose, Don/doff left TED hose  Lower body assist Assist for lower body dressing: Touching or steadying assistance (Pt > 75%)      Toileting Toileting Toileting activity did not  occur: Safety/medical concerns Toileting steps completed by patient: Adjust clothing prior to toileting, Performs perineal hygiene Toileting steps completed by helper: Adjust clothing after toileting Toileting Assistive Devices: Grab bar or rail  Toileting  assist Assist level: Supervision or verbal cues   Transfers Chair/bed transfer Chair/bed transfer activity did not occur:  (standard chair to w/c) Chair/bed transfer method: Ambulatory Chair/bed transfer assist level: Touching or steadying assistance (Pt > 75%) Chair/bed transfer assistive device: Armrests Mechanical lift: Scientist, water qualityara   Locomotion Ambulation Ambulation activity did not occur: Safety/medical concerns   Max distance: >150 ft Assist level: Touching or steadying assistance (Pt > 75%)   Wheelchair Wheelchair activity did not occur: Safety/medical concerns   Max wheelchair distance: 10 Assist Level: Dependent (Pt equals 0%)  Cognition Comprehension Comprehension assist level: Understands basic 50 - 74% of the time/ requires cueing 25 - 49% of the time  Expression Expression assist level: Expresses basic 75 - 89% of the time/requires cueing 10 - 24% of the time. Needs helper to occlude trach/needs to repeat words.  Social Interaction Social Interaction assist level: Interacts appropriately 75 - 89% of the time - Needs redirection for appropriate language or to initiate interaction.  Problem Solving Problem solving assist level: Solves basic 25 - 49% of the time - needs direction more than half the time to initiate, plan or complete simple activities  Memory Memory assist level: Recognizes or recalls 25 - 49% of the time/requires cueing 50 - 75% of the time  Medical Problem List and Plan: 1. 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 DVT Prophylaxis/Anticoagulation: Subcutaneous heparin. Monitor platelet counts and any signs of bleeding. 3. Pain Management: Hycet  4. Mood: Ativan as needed 5. Neuropsych: This patient is notcapable of making decisions on hisown behalf.  -HS seroquel  for sleep/agitation, reduced to 25mg --decrease further to 12 today  -limiting benzos  -continue ritalin for arousal, attention and initiation-   15mg  bid  -enclosure bed for safety still required, renewed.  Continue d/t fall risk  -soft helmet for safety 6. Skin/Wound Care: Routine skin checks  -helmet for cranium protection  -flap site intact  -crani incision much improved---only minor scabs   -3 attempts to contact NS re: potential cranioplasty---Still no response 7. Fluids/Electrolytes/Nutrition:    -intake reasonable 8.Seizure prophylaxis. Keppra 500 mg twice daily. Hesitate to decrease further given craniectomy 9.Pneumonia. resolved 10.Dysphagia. On D1 thins. Taking meds without issues 11.Urinary retention/recent Escherichia coli UTI s/p course of Cipro.   - repeat urine culture with 100k coag neg staph--- macrobid completed  -decreasing urecholine to 25mg  TID--showing improvement this week ---continue to wean   -continue timed voids, prn caths  -suspect cognitive/apraxic factors to emptying  12.Diabetes mellitus with peripheral neuropathy. Hemoglobin A1c 10.4.  Patient on Glucophage 500 mg twice a day and Glucotrol 5 mg daily at home  -lantus 15u qhs, glipizide 5 mg--tight control    13.History of hypertension. Patient on lisinopril 2.5 mg daily prior to admission. Resume if >130/85  Controlled 5/7   Vitals:   03/13/17 1513 03/14/17 0447  BP: 116/72 109/69  Pulse: 90 87  Resp: 20 18  Temp: 97.4 F (36.3 C) 98.2 F (36.8 C)   14.Constipation. Laxative assistance     LOS (Days) 41 A FACE TO FACE EVALUATION WAS PERFORMED  Ranelle OysterSWARTZ,Zarya Lasseigne T, MD 03/14/2017 9:21 AM

## 2017-03-15 ENCOUNTER — Other Ambulatory Visit: Payer: Self-pay | Admitting: Neurological Surgery

## 2017-03-15 ENCOUNTER — Inpatient Hospital Stay (HOSPITAL_COMMUNITY): Payer: Self-pay | Admitting: Speech Pathology

## 2017-03-15 ENCOUNTER — Inpatient Hospital Stay (HOSPITAL_COMMUNITY): Payer: Self-pay | Admitting: Physical Therapy

## 2017-03-15 ENCOUNTER — Inpatient Hospital Stay (HOSPITAL_COMMUNITY): Payer: Self-pay | Admitting: Occupational Therapy

## 2017-03-15 LAB — GLUCOSE, CAPILLARY
GLUCOSE-CAPILLARY: 133 mg/dL — AB (ref 65–99)
GLUCOSE-CAPILLARY: 109 mg/dL — AB (ref 65–99)
Glucose-Capillary: 114 mg/dL — ABNORMAL HIGH (ref 65–99)
Glucose-Capillary: 104 mg/dL — ABNORMAL HIGH (ref 65–99)

## 2017-03-15 MED ORDER — LEVETIRACETAM 100 MG/ML PO SOLN
500.0000 mg | Freq: Two times a day (BID) | ORAL | Status: DC
  Administered 2017-03-15 – 2017-03-18 (×8): 500 mg via ORAL
  Filled 2017-03-15 (×8): qty 5

## 2017-03-15 MED ORDER — BETHANECHOL CHLORIDE 10 MG PO TABS
10.0000 mg | ORAL_TABLET | Freq: Three times a day (TID) | ORAL | Status: DC
  Administered 2017-03-15 – 2017-03-16 (×5): 10 mg via ORAL
  Filled 2017-03-15 (×6): qty 1

## 2017-03-15 NOTE — Progress Notes (Signed)
Speech Language Pathology Daily Session Note  Patient Details  Name: Jamie BeckwithMiguel Garcia Berg MRN: [id number] Date of Birth: 1965/11/07  Today's Date: 03/15/2017 SLP Individual Time: 1330-1430 SLP Individual Time Calculation (min): 60 min  Short Term Goals: Week 6: SLP Short Term Goal 1 (Week 6): Patient will consume current diet with minimal overt s/s of aspiration with supervision verbal cues for use of swallowing compensatory strategies.  SLP Short Term Goal 2 (Week 6): Patient will demonstrate efficient mastication with mild oral residue and minimal overt s/s of aspiration with Mod A verbal cues over 2 sessions prior to upgrade.  SLP Short Term Goal 3 (Week 6): Patient will demonstrate selective attention to a functional task for ~10 minutes with Mod A verbal cues for redirection in a mildly distracting enviornment.  SLP Short Term Goal 4 (Week 6): Patient will orient to place, situation and time with Min  A multimodal cues.  SLP Short Term Goal 5 (Week 6): Patient will demonstrate basic problem solving for basic tasks with Mod A verbal cues.   Skilled Therapeutic Interventions: Skilled treatment session focused on family education with the patient's sister. Patient's sister educated in regards to patient's current cognitive functioning and strategies to utilize at home to maximize recall, problem solving and overall safety such as baby monitors, removing all dangerous items from home, etc. She verbalized understanding but will need reinforcement. She was also educated in regards to his current swallowing function, diet recommendations, appropriate textures, swallowing compensatory strategies and medication administration. She verbalized understanding and all questions were answered. Patient left upright in wheelchair with all needs within reach and family present. Continue with current plan of care.      Function:  Cognition Comprehension Comprehension assist level: Understands basic 50 -  74% of the time/ requires cueing 25 - 49% of the time  Expression   Expression assist level: Expresses basic 75 - 89% of the time/requires cueing 10 - 24% of the time. Needs helper to occlude trach/needs to repeat words.  Social Interaction Social Interaction assist level: Interacts appropriately 75 - 89% of the time - Needs redirection for appropriate language or to initiate interaction.  Problem Solving Problem solving assist level: Solves basic 25 - 49% of the time - needs direction more than half the time to initiate, plan or complete simple activities  Memory Memory assist level: Recognizes or recalls 25 - 49% of the time/requires cueing 50 - 75% of the time    Pain No/Denies Pain   Therapy/Group: Individual Therapy  Basil Blakesley 03/15/2017, 2:49 PM

## 2017-03-15 NOTE — Progress Notes (Signed)
Occupational Therapy Session Note  Patient Details  Name: Jamie BeckwithMiguel Garcia Cardenas MRN: [id number] Date of Birth: [birth year]  Today's Date: 03/15/2017 OT Individual Time: [id number] OT Individual Time Calculation (min): 58 min    Skilled Therapeutic Interventions/Progress Updates:    Tx focus on ADL retraining, cognitive remediation, and dynamic balance.   Pt greeted supine in bed, agreeable to shower. Pt ambulated to shower bench with HHA, initiating clothing removal and starting shower with extra time. He required overall Min A when standing and tactile cues to attend to all body areas. Afterwards pt ambulated to dresser for clothing. Pt donned pants x3 times with both LEs in the same pant leg, he required mod questioning cues to doff pants and don them correctly. Instructional cues provided for pt to doff gripper socks to don clean regular socks (pt will don one sock over the other and report this is correct). Had him practice fixing mistakes/extra knots in laces before tying them. Pt ultimately requiring max A for problem solving. After brushing teeth at sink with Min guard, pt was returned to secure enclosure bed and left with needs within reach.   No interpretor present during tx. Heavy tactile cues utilized for pt understanding.     Therapy Documentation Precautions:  Precautions Precautions: Fall, Other (comment) Precaution Comments: bone flap removed R side of head in abdomen; hemet for OOB; Pt no longer with NG or PICC; enclosure bed Required Braces or Orthoses: Other Brace/Splint Other Brace/Splint: helmet for OOB Restrictions Weight Bearing Restrictions: No   Pain: No c/o pain during tx    ADL:  :    See Function Navigator for Current Functional Status.   Therapy/Group: Individual Therapy  Garielle Mroz A Chan Rosasco 03/15/2017, 12:24 PM

## 2017-03-15 NOTE — Plan of Care (Signed)
Problem: RH BOWEL ELIMINATION Goal: RH STG MANAGE BOWEL WITH ASSISTANCE STG Manage Bowel with mod  Assistance.    Outcome: Progressing Minimum assistance from bed to and back to bed, had a bm tonight  Problem: RH BLADDER ELIMINATION Goal: RH STG MANAGE BLADDER WITH ASSISTANCE STG Manage Bladder With Mod Assistance   Outcome: Progressing Continent of bladder, voided in the toilet with SBA  Problem: RH SKIN INTEGRITY Goal: RH STG SKIN FREE OF INFECTION/BREAKDOWN No new skin breakdown or infection while on rehab with min assist from staff.  Outcome: Progressing Incision to right side of head healing well, right abdomen incision is healed Goal: RH STG ABLE TO PERFORM INCISION/WOUND CARE W/ASSISTANCE STG Able To Perform Incision/Wound Care With max Assistance.   Outcome: Not Progressing Unable to perform own incision care  Problem: RH SAFETY Goal: RH STG ADHERE TO SAFETY PRECAUTIONS W/ASSISTANCE/DEVICE STG Adhere to Safety Precautions With Max Assistance/Device.   Outcome: Progressing Safety precaution and fall prevention maintained, no fall or injury noted this shift  Problem: RH PAIN MANAGEMENT Goal: RH STG PAIN MANAGED AT OR BELOW PT'S PAIN GOAL <2  Outcome: Progressing Denies pain and discomfort

## 2017-03-15 NOTE — Progress Notes (Signed)
Occupational Therapy Session Note  Patient Details  Name: [name] MRN: [id number] Date of Birth: 12-17-1965  Today's Date: 03/15/2017 OT Individual Time: [id number] OT Individual Time Calculation (min): 32 min    Short Term Goals: Week 5:  OT Short Term Goal 1 (Week 5): STGs=LTGs due to ELOS  Skilled Therapeutic Interventions/Progress Updates:    Upon entering the room, pt in bed and calls out greeting as therapist enters the room. No sign or symptoms of pain this session. RN present in room to give medications and pt sitting on EOB with min gestures as interpreter not present this session. Pt seated with overall supervision level while taking medications. Pt standing from bed and ambulating with mod A into bathroom for shower at overall min A level. Pt standing once to wash buttocks and peri area. Pt returning to sit in recliner chair to locate clean clothing from dresser with min cues. Pt attempting to put clothing on backwards and needing mod multimodal cues for redirection. Pt ambulating to bed and declined further therapy at this time. Pt stating, " I am tired." Bed secured and OT exiting the room.   Therapy Documentation Precautions:  Precautions Precautions: Fall, Other (comment) Precaution Comments: bone flap removed R side of head in abdomen; hemet for OOB; Pt no longer with NG or PICC; enclosure bed Required Braces or Orthoses: Other Brace/Splint Other Brace/Splint: helmet for OOB Restrictions Weight Bearing Restrictions: No General: General OT Amount of Missed Time: 13 Minutes Vital Signs: Therapy Vitals Temp: 97.4 F (36.3 C) Temp Source: Oral Pulse Rate: (!) 106 Resp: 18 BP: 113/68 Patient Position (if appropriate): Sitting Oxygen Therapy SpO2: 100 % O2 Device: Not Delivered Pain:   ADL:   Vision   Perception    Praxis   Exercises:   Other Treatments:    See Function Navigator for Current Functional Status.   Therapy/Group:  Individual Therapy  [name], [name] 03/15/2017, 2:52 PM

## 2017-03-15 NOTE — Progress Notes (Signed)
Physical Therapy Session Note  Patient Details  Name: Jamie BeckwithMiguel Garcia Cardenas MRN: [id number] Date of Birth: 1966-09-13  Today's Date: 03/15/2017 PT Individual Time: [id number] and 1304-1330 PT Individual Time Calculation (min): 71 min and 26 min  Short Term Goals: Week 6:  PT Short Term Goal 1 (Week 6): STG = LTG due to estimated d/c date.  Skilled Therapeutic Interventions/Progress Updates:  Treatment 1: Pt received in bed; no behaviors demonstrating pain during session. Pt transferred to sitting EOB & donned shoes; pt able to correct error and place correct shoe on foot without assistance. Pt required assistance from therapist to untie shoe before donning them. Pt ambulated throughout unit with min assist without AD. Pt engaged in standing balance activity including kicking a stationary and a moving soccer ball with mod assist for dynamic standing balance. Pt utilized 6# dumbbells and 5# weighted bar for various BUE strengthening exercises with demonstration from therapist.  When using dumbbells pt demonstrates continued LUE apraxia but he is able to coordinate movements better when using weighted bar. Pt utilized nu-step on level 4 x 12 minutes with all 4 extremities and task focusing on coordination of reciprocal movements and endurance training. Pt utilized cybex kinetron in sitting for BLE strengthening and NMR; pt requires max tactile cuing for technique and reciprocal movements. Pt engaged in zoom ball (while sitting) with max tactile cuing fading to supervision but demo throughout task. Pt with slightly improving BUE coordination and use of LUE during task. At end of session pt left in enclosure bed in room with all needs within reach.  When given choice of two locations (school or hospital) pt reports he is in a school; therapist provided orientation.   Treatment 2: Pt received in bed with sister present for session. Pt without c/o or behaviors demonstrating pain. Pt required encouragement to  transfer to EOB but did so with supervision. Pt required cuing to recognize he did not put appropriate shoe on foot. Pt ambulated throughout unit with assistance from PT and then sister. Pt requires min increasing to mod assist 2/2 impaired balance and fatigue. Pt's sister requested assistance from therapist on one occasion to help maintain pt's balance. Educated [name] on need to recognize when pt fatigues and to prepare locations for seated rest breaks. Pt negotiated full flight of stairs in stairwell with L rail and max assist with max cuing for step to sequencing & safety. Pt requires assistance for hand placement as pt easily distracted by LUE and is unsure where to place RUE. Pt reported need to use restroom & [name] provided assistance for standing balance while pt with continent void. [name] requires cuing from therapist to lock w/c brakes before assisting pt to sit in it. [name] very concerned about pt's enclosure bed & cranial flap; LCSW made aware. At end of session pt left sitting in w/c with sister to present to supervise.   Therapy Documentation Precautions:  Precautions Precautions: Fall, Other (comment) Precaution Comments: bone flap removed R side of head in abdomen; hemet for OOB; Pt no longer with NG or PICC; enclosure bed Required Braces or Orthoses: Other Brace/Splint Other Brace/Splint: helmet for OOB Restrictions Weight Bearing Restrictions: No   See Function Navigator for Current Functional Status.   Therapy/Group: Individual Therapy  Sandi MariscalVictoria M Miller 03/15/2017, 1:54 PM

## 2017-03-15 NOTE — Progress Notes (Signed)
[place] PHYSICAL MEDICINE & REHABILITATION     PROGRESS NOTE    Subjective/Complaints: Up in bed. Awake. No new complaints  ROS: Limited due cognitive/behavioral   Objective: Vital Signs: Blood pressure 127/71, pulse 98, temperature 97.8 F (36.6 C), temperature source Axillary, resp. rate 20, height 5\' 2"  (1.575 m), weight 60.4 kg (133 lb 1.6 oz), SpO2 100 %. No results found. No results for input(s): WBC, HGB, HCT, PLT in the last 72 hours. No results for input(s): NA, K, CL, GLUCOSE, BUN, CREATININE, CALCIUM in the last 72 hours.  Invalid input(s): CO CBG (last 3)   Recent Labs  03/14/17 1643 03/14/17 2127 03/15/17 0620  GLUCAP 117* 136* 114*    Wt Readings from Last 3 Encounters:  03/15/17 60.4 kg (133 lb 1.6 oz)  02/01/17 65.6 kg (144 lb 9.6 oz)  07/31/16 68 kg (150 lb)    Physical Exam:    Gen: NAD. Well developed.  HENT:  Craniectomy scar  Cardiovascular:  RRR Respiratory:  CTA B GI: Soft. Bowel sounds are normal. Neurological:   Spontaneously moving all 4 extremities. More attentive to left Psychiatric:   Flat but very cooperative, pleasant Skin. Warm and dry. Crani wound without drainage. Areas of scab diminishing   Assessment/Plan: 1. Cognitive and functional deficits secondary to right traumatic ICH/SDH s/p craniectomy which require 3+ hours per day of interdisciplinary therapy in a comprehensive inpatient rehab setting. Physiatrist is providing close team supervision and 24 hour management of active medical problems listed below. Physiatrist and rehab team continue to assess barriers to discharge/monitor patient progress toward functional and medical goals.  Function:  Bathing Bathing position   Position: Shower  Bathing parts Body parts bathed by patient: Right arm, Left arm, Chest, Abdomen, Right upper leg, Left upper leg, Right lower leg, Left lower leg, Front perineal area, Buttocks Body parts bathed by helper: Back  Bathing assist  Assist Level: Touching or steadying assistance(Pt > 75%)      Upper Body Dressing/Undressing Upper body dressing   What is the patient wearing?: Pull over shirt/dress     Pull over shirt/dress - Perfomed by patient: Thread/unthread right sleeve, Thread/unthread left sleeve, Put head through opening, Pull shirt over trunk Pull over shirt/dress - Perfomed by helper: Pull shirt over trunk        Upper body assist Assist Level: Supervision or verbal cues   Set up : To obtain clothing/put away  Lower Body Dressing/Undressing Lower body dressing   What is the patient wearing?: Underwear, Pants, Socks, Shoes Underwear - Performed by patient: Thread/unthread right underwear leg, Thread/unthread left underwear leg, Pull underwear up/down   Pants- Performed by patient: Thread/unthread right pants leg, Thread/unthread left pants leg, Pull pants up/down Pants- Performed by helper: Pull pants up/down Non-skid slipper socks- Performed by patient: Don/doff left sock, Don/doff right sock Non-skid slipper socks- Performed by helper: Don/doff right sock, Don/doff left sock Socks - Performed by patient: Don/doff right sock, Don/doff left sock Socks - Performed by helper: Don/doff right sock, Don/doff left sock Shoes - Performed by patient: Don/doff right shoe, Don/doff left shoe, Fasten right, Fasten left Shoes - Performed by helper: Fasten right, Fasten left     TED Hose - Performed by patient: Don/doff right TED hose TED Hose - Performed by helper: Don/doff right TED hose, Don/doff left TED hose  Lower body assist Assist for lower body dressing: Touching or steadying assistance (Pt > 75%)      Toileting Toileting Toileting activity did not occur:  Safety/medical concerns Toileting steps completed by patient: Adjust clothing prior to toileting, Performs perineal hygiene Toileting steps completed by helper: Adjust clothing after toileting Toileting Assistive Devices: Grab bar or rail  Toileting  assist Assist level: Supervision or verbal cues   Transfers Chair/bed transfer Chair/bed transfer activity did not occur:  (standard chair to w/c) Chair/bed transfer method: Ambulatory Chair/bed transfer assist level: Touching or steadying assistance (Pt > 75%) Chair/bed transfer assistive device: Armrests Mechanical lift: Scientist, water quality activity did not occur: Safety/medical concerns   Max distance: >150 ft Assist level: Touching or steadying assistance (Pt > 75%)   Wheelchair Wheelchair activity did not occur: Safety/medical concerns   Max wheelchair distance: 10 Assist Level: Dependent (Pt equals 0%)  Cognition Comprehension Comprehension assist level: Understands basic 50 - 74% of the time/ requires cueing 25 - 49% of the time  Expression Expression assist level: Expresses basic 75 - 89% of the time/requires cueing 10 - 24% of the time. Needs helper to occlude trach/needs to repeat words.  Social Interaction Social Interaction assist level: Interacts appropriately 75 - 89% of the time - Needs redirection for appropriate language or to initiate interaction.  Problem Solving Problem solving assist level: Solves basic 25 - 49% of the time - needs direction more than half the time to initiate, plan or complete simple activities  Memory Memory assist level: Recognizes or recalls 25 - 49% of the time/requires cueing 50 - 75% of the time  Medical Problem List and Plan: 1. 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 DVT Prophylaxis/Anticoagulation: Subcutaneous heparin. Monitor platelet counts and any signs of bleeding. 3. Pain Management: Hycet  4. Mood: Ativan as needed 5. Neuropsych: This patient is notcapable of making decisions on hisown behalf.  -HS seroquel  for sleep/agitation, reduced to 25mg --decrease further to 12 today  -limiting benzos  -continue ritalin for arousal, attention and initiation-   15mg  bid  -enclosure bed for safety still required, renewed.  Continue d/t fall risk  -soft helmet for safety 6. Skin/Wound Care: Routine skin checks  -helmet for cranium protection  -flap site intact  -crani incision much improved---only minor scabs   -3 attempts to contact NS over last 8 days re: potential cranioplasty---Still no response 7. Fluids/Electrolytes/Nutrition:    -intake reasonable 8.Seizure prophylaxis. Keppra 500 mg twice daily. Hesitate to decrease further given craniectomy 9.Pneumonia. resolved 10.Dysphagia. On D1 thins. Taking meds without issues 11.Urinary retention/recent Escherichia coli UTI s/p course of Cipro.   - repeat urine culture with 100k coag neg staph--- macrobid completed  -decrease urecholine to 10mg  TID--showing improvement--now essentially continent   -continue timed voids   12.Diabetes mellitus with peripheral neuropathy. Hemoglobin A1c 10.4.  Patient on Glucophage 500 mg twice a day and Glucotrol 5 mg daily at home  -lantus 15u qhs, glipizide 5 mg--tight control    13.History of hypertension. Patient on lisinopril 2.5 mg daily prior to admission. Resume if >130/85  Controlled 5/7   Vitals:   03/14/17 1545 03/15/17 0513  BP: 114/69 127/71  Pulse: 81 98  Resp: 19 20  Temp: 97.9 F (36.6 C) 97.8 F (36.6 C)   14.Constipation. Laxative assistance     LOS (Days) 42 A FACE TO FACE EVALUATION WAS PERFORMED  [name], MD 03/15/2017 8:57 AM

## 2017-03-16 ENCOUNTER — Inpatient Hospital Stay (HOSPITAL_COMMUNITY): Payer: Self-pay | Admitting: Speech Pathology

## 2017-03-16 ENCOUNTER — Inpatient Hospital Stay (HOSPITAL_COMMUNITY): Payer: Self-pay | Admitting: Physical Therapy

## 2017-03-16 ENCOUNTER — Inpatient Hospital Stay (HOSPITAL_COMMUNITY): Payer: Self-pay | Admitting: Occupational Therapy

## 2017-03-16 ENCOUNTER — Inpatient Hospital Stay (HOSPITAL_COMMUNITY): Payer: Self-pay

## 2017-03-16 LAB — GLUCOSE, CAPILLARY
GLUCOSE-CAPILLARY: 156 mg/dL — AB (ref 65–99)
Glucose-Capillary: 125 mg/dL — ABNORMAL HIGH (ref 65–99)
Glucose-Capillary: 125 mg/dL — ABNORMAL HIGH (ref 65–99)
Glucose-Capillary: 111 mg/dL — ABNORMAL HIGH (ref 65–99)

## 2017-03-16 NOTE — Progress Notes (Signed)
Physical Therapy Session Note  Patient Details  Name: Jamie BeckwithMiguel Garcia Cardenas MRN: [id number] Date of Birth: Aug 31, 1966  Today's Date: 03/16/2017 PT Individual Time: [id number] and 1304-1500 PT Individual Time Calculation (min): 54 min and 56 min  Short Term Goals: Week 6:  PT Short Term Goal 1 (Week 6): STG = LTG due to estimated d/c date.  Skilled Therapeutic Interventions/Progress Updates:  Treatment 1: Pt received in room with sister present for caregiver training. Pt denies c/o pain. Session focused on caregiver training, cognitive remediation, and stair negotiation. [name] assisted pt with ambulating throughout unit with gait belt and min assist. [name] demonstrates decreased safety awareness, at one point she was assisting pt with ambulation while simultaneously video chatting on phone. Educated [name] on need to provide undivided attention & supervision to pt to reduce fall risk. Pt transferred supine<>sit on bed in apartment with supervision; pt with difficulty following commands to roll L<>R in bed. Discussed activities pt could assist [name] with at home; encouraged her to have pt sit while assisting with tasks if she is unable to assist with balance. Educated [name] on not to have pt assist her with any task involving knives in kitchen. Pt negotiated 12 steps in stairwell with mod assist with MAX cuing for step-to pattern and increased safety. Pt continues to require assistance for BUE hand placement to prevent pt from getting distracted by LUE. Pt engaged in calendar task, arranging numbers in order; pt required moderate cuing to attend to L and min cuing to correctly arrange numbers. At end of session pt left in handoff to SLP.   Treatment 2: Pt received in room & agreeable to tx. Session focused on caregiver training & education, stair negotiation, and cognitive remediation. [name] assisted pt with ambulating throughout unit with min assist & gait belt. [name] able to assist pt  with car transfer with pt requiring min assist. Pt with difficulty transferring LLE out of car 2/2 apraxia but [name] able to provide cuing. Therapist assisted pt with stair negotiation (12 steps in stairwell with L rail only); pt required mod assist for balance and MAX verbal cuing for step-to pattern and safety awareness. [name] reports she does not feel comfortable attempting stair negotiation despite therapist's encouragement and education. Pt engaged in pipe tree activity progressing from simple to slightly complex shapes; pt required max cuing for problem solving & error correction with pre-selected pieces. Pt neglecting to build L portion of shape. Discussed checking [name] off to assist pt bed<>bathroom in room (she assisted pt to bathroom at beginning of session without physical assistance from therapist) & [name] reluctantly agreeable to this; encouraged her to continue assisting pt and to ask for help when she feels it's necessary. At end of session pt left in handoff to OT.  Therapy Documentation Precautions:  Precautions Precautions: Fall, Other (comment) Precaution Comments: bone flap removed R side of head in abdomen; hemet for OOB; Pt no longer with NG or PICC; enclosure bed Required Braces or Orthoses: Other Brace/Splint Other Brace/Splint: helmet for OOB Restrictions Weight Bearing Restrictions: No   See Function Navigator for Current Functional Status.   Therapy/Group: Individual Therapy  Sandi MariscalVictoria M Jobeth Pangilinan 03/16/2017, 3:30 PM

## 2017-03-16 NOTE — Progress Notes (Signed)
[place] PHYSICAL MEDICINE & REHABILITATION     PROGRESS NOTE    Subjective/Complaints: No new issues. Slept per rn.   ROS: pt denies nausea, vomiting, diarrhea, cough, shortness of breath or chest pain    Objective: Vital Signs: Blood pressure 115/66, pulse 85, temperature 97.8 F (36.6 C), temperature source Oral, resp. rate 18, height 5\' 2"  (1.575 m), weight 60.4 kg (133 lb 1.6 oz), SpO2 100 %. No results found. No results for input(s): WBC, HGB, HCT, PLT in the last 72 hours. No results for input(s): NA, K, CL, GLUCOSE, BUN, CREATININE, CALCIUM in the last 72 hours.  Invalid input(s): CO CBG (last 3)   Recent Labs  03/15/17 1635 03/15/17 2032 03/16/17 0626  GLUCAP 133* 109* 125*    Wt Readings from Last 3 Encounters:  03/15/17 60.4 kg (133 lb 1.6 oz)  02/01/17 65.6 kg (144 lb 9.6 oz)  07/31/16 68 kg (150 lb)    Physical Exam:    Gen: NAD. Well developed.  HENT:  Craniectomy scar  Cardiovascular:  RRR Respiratory:  CTA B GI: Soft. Bowel sounds are normal. Neurological:   Spontaneously moving all 4 extremities. More attentive to left Psychiatric:   Flat but very cooperative, pleasant Skin. Warm and dry. Crani wound without drainage. Areas of scab diminishing   Assessment/Plan: 1. Cognitive and functional deficits secondary to right traumatic ICH/SDH s/p craniectomy which require 3+ hours per day of interdisciplinary therapy in a comprehensive inpatient rehab setting. Physiatrist is providing close team supervision and 24 hour management of active medical problems listed below. Physiatrist and rehab team continue to assess barriers to discharge/monitor patient progress toward functional and medical goals.  Function:  Bathing Bathing position   Position: Shower  Bathing parts Body parts bathed by patient: Right arm, Left arm, Chest, Abdomen, Right upper leg, Left upper leg, Right lower leg, Left lower leg, Front perineal area, Buttocks Body parts bathed  by helper: Back  Bathing assist Assist Level: Touching or steadying assistance(Pt > 75%)      Upper Body Dressing/Undressing Upper body dressing   What is the patient wearing?: Pull over shirt/dress     Pull over shirt/dress - Perfomed by patient: Thread/unthread right sleeve, Thread/unthread left sleeve, Put head through opening, Pull shirt over trunk Pull over shirt/dress - Perfomed by helper: Pull shirt over trunk        Upper body assist Assist Level: Supervision or verbal cues   Set up : To obtain clothing/put away  Lower Body Dressing/Undressing Lower body dressing   What is the patient wearing?: Underwear, Pants, Socks, Shoes Underwear - Performed by patient: Thread/unthread right underwear leg, Thread/unthread left underwear leg, Pull underwear up/down   Pants- Performed by patient: Thread/unthread right pants leg, Thread/unthread left pants leg, Pull pants up/down Pants- Performed by helper: Pull pants up/down Non-skid slipper socks- Performed by patient: Don/doff left sock, Don/doff right sock Non-skid slipper socks- Performed by helper: Don/doff right sock, Don/doff left sock Socks - Performed by patient: Don/doff right sock, Don/doff left sock Socks - Performed by helper: Don/doff right sock, Don/doff left sock Shoes - Performed by patient: Don/doff right shoe, Don/doff left shoe, Fasten right, Fasten left Shoes - Performed by helper: Fasten right, Fasten left     TED Hose - Performed by patient: Don/doff right TED hose TED Hose - Performed by helper: Don/doff right TED hose, Don/doff left TED hose  Lower body assist Assist for lower body dressing: Touching or steadying assistance (Pt > 75%)  Toileting Toileting Toileting activity did not occur: Safety/medical concerns Toileting steps completed by patient: Adjust clothing prior to toileting, Performs perineal hygiene Toileting steps completed by helper: Adjust clothing after toileting Toileting Assistive  Devices: Grab bar or rail  Toileting assist Assist level: Supervision or verbal cues   Transfers Chair/bed transfer Chair/bed transfer activity did not occur:  (standard chair to w/c) Chair/bed transfer method: Ambulatory Chair/bed transfer assist level: Touching or steadying assistance (Pt > 75%) Chair/bed transfer assistive device: Armrests Mechanical lift: Scientist, water qualityara   Locomotion Ambulation Ambulation activity did not occur: Safety/medical concerns   Max distance: 150 ft  Assist level: Touching or steadying assistance (Pt > 75%)   Wheelchair Wheelchair activity did not occur: Safety/medical concerns   Max wheelchair distance: 10 Assist Level: Dependent (Pt equals 0%)  Cognition Comprehension Comprehension assist level: Understands basic 50 - 74% of the time/ requires cueing 25 - 49% of the time  Expression Expression assist level: Expresses basic 75 - 89% of the time/requires cueing 10 - 24% of the time. Needs helper to occlude trach/needs to repeat words.  Social Interaction Social Interaction assist level: Interacts appropriately 75 - 89% of the time - Needs redirection for appropriate language or to initiate interaction.  Problem Solving Problem solving assist level: Solves basic 25 - 49% of the time - needs direction more than half the time to initiate, plan or complete simple activities  Memory Memory assist level: Recognizes or recalls 25 - 49% of the time/requires cueing 50 - 75% of the time  Medical Problem List and Plan: 1. 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 DVT Prophylaxis/Anticoagulation: Subcutaneous heparin. Monitor platelet counts and any signs of bleeding. 3. Pain Management: Hycet  4. Mood: Ativan as needed 5. Neuropsych: This patient is notcapable of making  decisions on hisown behalf.  -HS seroquel for sleep/agitation, reduced to 25mg --decrease further to 12 today  -limiting benzos  -continue ritalin for arousal, attention and initiation-   15mg  bid  -enclosure bed for safety still required, renewed.  Continue d/t fall risk  -soft helmet for safety 6. Skin/Wound Care: Routine skin checks  -helmet for cranium protection  -flap site intact  -crani incision much improved---only minor scabs   -3 attempts to contact NS over last 8+ days re: potential cranioplasty---Still no response 7. Fluids/Electrolytes/Nutrition:    -intake reasonable 8.Seizure prophylaxis. Keppra 500 mg twice daily. Hesitate to decrease further given craniectomy 9.Pneumonia. resolved 10.Dysphagia. On D1 thins. Taking meds without issues 11.Urinary retention/recent Escherichia coli UTI s/p course of Cipro.   - repeat urine culture with 100k coag neg staph--- macrobid completed  -decrease urecholine to 10mg  TID--showing improvement--now essentially continent   -continue timed voids   12.Diabetes mellitus with peripheral neuropathy. Hemoglobin A1c 10.4.  Patient on Glucophage 500 mg twice a day and Glucotrol 5 mg daily at home  -lantus 15u qhs, glipizide 5 mg--tight control    13.History of hypertension. Patient on lisinopril 2.5 mg daily prior to admission. Resume if >130/85  Controlled 5/7   Vitals:   03/15/17 1422 03/16/17 0451  BP: 113/68 115/66  Pulse: (!) 106 85  Resp: 18 18  Temp: 97.4 F (36.3 C) 97.8 F (36.6 C)   14.Constipation. Laxative assistance     LOS (Days) 43 A FACE TO FACE EVALUATION WAS PERFORMED  Ranelle OysterSWARTZ,ZACHARY T, MD 03/16/2017 8:41  AM

## 2017-03-16 NOTE — Progress Notes (Signed)
Nutrition Follow-up  DOCUMENTATION CODES:   Not applicable  INTERVENTION:  Continue Glucerna Shake po TID, each supplement provides 220 kcal and 10 grams of protein.  Encourage adequate PO intake.   NUTRITION DIAGNOSIS:   Increased nutrient needs related to acute illness as evidenced by estimated needs; ongoing  GOAL:   Patient will meet greater than or equal to 90% of their needs; met  MONITOR:   PO intake, Supplement acceptance, Labs, Weight trends, Skin, I & O's, Diet advancement  REASON FOR ASSESSMENT:   Consult Enteral/tube feeding initiation and management  ASSESSMENT:   [name] with history of hypertension, diabetes mellitus. Presented 01/10/2017 after being found down from a supposed fall. CT of the head and imaging revealed intracranial hemorrhage with a 4.5 cm right frontal intraparenchymal hemorrhage, 6 mm right subdural hematoma and other scattered areas of intraparenchymal and subarachnoid hemorrhage with right to left midline shift. Nondisplaced vertical fracture through the left temporal bone.  Underwent right frontal temporal parietal craniectomy for hematoma evacuation 01/14/2017 per Dr. Cyndy Freeze with placement of bone flap in the abdomen.  Nasogastric tube in place for nutritional support patient remains NPO  Pt is currently on a dysphagia 2 diet with thin liquids. Meal completion has been 75-100%. Pt currently has Glucerna Shake ordered and has been consuming them. RD to continue with current orders. Intake has been adequate.  Diet Order:  DIET DYS 2 Room service appropriate? Yes; Fluid consistency: Thin  Skin:   (Incision on head R)  Last BM:  5/13  Height:   Ht Readings from Last 1 Encounters:  02/01/17 _0  (1.575 m)    Weight:   Wt Readings from Last 1 Encounters:  03/15/17 133 lb 1.6 oz (60.4 kg)    Ideal Body Weight:  53.6 kg  BMI:  Body mass index is 24.34 kg/m.  Estimated Nutritional Needs:    Kcal:  1800-2000  Protein:  100-115 grams  Fluid:  >/= 1.8 L/day  EDUCATION NEEDS:   No education needs identified at this time  Corrin Parker, MS, RD, LDN Pager # [phone] After hours/ weekend pager # [phone]

## 2017-03-16 NOTE — Progress Notes (Signed)
Speech Language Pathology Daily Session Note  Patient Details  Name: [name] MRN: [id number] Date of Birth: 27-Apr-1966  Today's Date: 03/16/2017 SLP Individual Time: 1100-1200 SLP Individual Time Calculation (min): 60 min  Short Term Goals: Week 6: SLP Short Term Goal 1 (Week 6): Patient will consume current diet with minimal overt s/s of aspiration with supervision verbal cues for use of swallowing compensatory strategies.  SLP Short Term Goal 2 (Week 6): Patient will demonstrate efficient mastication with mild oral residue and minimal overt s/s of aspiration with Mod A verbal cues over 2 sessions prior to upgrade.  SLP Short Term Goal 3 (Week 6): Patient will demonstrate selective attention to a functional task for ~10 minutes with Mod A verbal cues for redirection in a mildly distracting enviornment.  SLP Short Term Goal 4 (Week 6): Patient will orient to place, situation and time with Min  A multimodal cues.  SLP Short Term Goal 5 (Week 6): Patient will demonstrate basic problem solving for basic tasks with Mod A verbal cues.   Skilled Therapeutic Interventions: Skilled treatment session focused on family education and dysphagia goals. SLP facilitated session by continuing family education with the patient's sister that focused on memory strategies and generating a schedule to utilize at home to maximize carryover with a routine/schedule. Patient also consumed lunch tray of Dys. 2 textures with thin liquids without overt s/s of aspiration with Min A verbal cues for use of swallowing compensatory strategies. Patient's sister appeared distracted at times and needed encouragement from clinician to provide cues during meal. Patient left supine in enclosure bed with sister present. Continue with current plan of care.      Function:  Eating Eating   Modified Consistency Diet: Yes Eating Assist Level: Set up assist for;Supervision or verbal cues;Helper checks for pocketed food    Eating Set Up Assist For: Opening containers       Cognition Comprehension Comprehension assist level: Understands basic 50 - 74% of the time/ requires cueing 25 - 49% of the time  Expression   Expression assist level: Expresses basic 75 - 89% of the time/requires cueing 10 - 24% of the time. Needs helper to occlude trach/needs to repeat words.  Social Interaction Social Interaction assist level: Interacts appropriately 75 - 89% of the time - Needs redirection for appropriate language or to initiate interaction.  Problem Solving Problem solving assist level: Solves basic 25 - 49% of the time - needs direction more than half the time to initiate, plan or complete simple activities  Memory Memory assist level: Recognizes or recalls 25 - 49% of the time/requires cueing 50 - 75% of the time    Pain No/Denies Pain   Therapy/Group: Individual Therapy  Alexarae Oliva 03/16/2017, 3:14 PM

## 2017-03-16 NOTE — Progress Notes (Signed)
Occupational Therapy Session Note  Patient Details  Name: Jamie BeckwithMiguel Garcia Berg MRN: [id number] Date of Birth: [birth year]  Today's Date: 03/16/2017 OT Individual Time: 0800-0900 OT Individual Time Calculation (min): 60 min    Short Term Goals: Week 5:  OT Short Term Goal 1 (Week 5): STGs=LTGs due to ELOS  Skilled Therapeutic Interventions/Progress Updates:    Upon entering the room, pt supine in bed with no c/o pain. No family present for education at this time. Interpreter present. Pt performed supine >sit with supervision and min tactile cues. Pt ambulates with min HHA into bathroom for toilet. Pt needing close supervision for sitting balance on toilet. Pt having BM and able to void this session with increased time. Pt performed clothing management and hygiene with steady assistance in standing. Pt engaged in bathing from sink based on possible difficulties getting to shower on second floor at home. Pt bathing while standing with mod increased cues for this task secondary to it being different from routine (shower). Pt needing to be redirected with mod multimodal cues as he attempts twice to go to bathroom and stand in shower. Pt selecting proper clothing from dresser with increased time and min cues. Pt seated on edge of bed in order to don all clothing items with steady assistance for balance and safety. Pt ambulating with min hand held assistance to ADL apartment for floor transfer with overall mod A for safety. Pt returning to room in same manner as above. Sister present at this time and reviewed session with her. Pt returning to enclosure bed secondary to fatigue. All needs within reach upon exiting the room.   Therapy Documentation Precautions:  Precautions Precautions: Fall, Other (comment) Precaution Comments: bone flap removed R side of head in abdomen; hemet for OOB; Pt no longer with NG or PICC; enclosure bed Required Braces or Orthoses: Other Brace/Splint Other Brace/Splint:  helmet for OOB Restrictions Weight Bearing Restrictions: No  See Function Navigator for Current Functional Status.   Therapy/Group: Individual Therapy  [name], Shardea Cwynar P 03/16/2017, 2:36 PM

## 2017-03-16 NOTE — Progress Notes (Signed)
Occupational Therapy Session Note  Patient Details  Name: [name] MRN: [id number] Date of Birth: [birth year]  Today's Date: 03/16/2017 OT Individual Time: [id number] OT Individual Time Calculation (min): 24 min    Short Term Goals: Week 5:  OT Short Term Goal 1 (Week 5): STGs=LTGs due to ELOS  Skilled Therapeutic Interventions/Progress Updates:    1:1. Translator and sister present throughout session. Focus of session on caregiver training for sister for toilet transfers and floor recovery. Pt dons B socks and shoes with increased time. Pt puts shoes on wrong foot, and with question cueing pt still unable to identify shoes on backwards. With direct VC, pt puts shoes on correct feet.  Sister provides steadying A and VC for pt to transfer onto toilet and complete clothing management and hygiene. Pt requires tactile and VC to remain seated on toilet while sister gathers washcloths. Pt practices floor recovery 3x with MIN A and VC for use of sturdy furniture. Sister observes first 2 trials and provides A & VC for last trial. OT educates sister on assessing situation if pt was to fall and how to assist pt to floor if unable to catch pt balance completely/avoid injury. Exited session with pt supine in bed vail bed fastened and sister in room.  Therapy Documentation Precautions:  Precautions Precautions: Fall, Other (comment) Precaution Comments: bone flap removed R side of head in abdomen; hemet for OOB; Pt no longer with NG or PICC; enclosure bed Required Braces or Orthoses: Other Brace/Splint Other Brace/Splint: helmet for OOB Restrictions Weight Bearing Restrictions: No  See Function Navigator for Current Functional Status.   Therapy/Group: Individual Therapy  Shon HaleStephanie M Alta Shober 03/16/2017, 2:33 PM

## 2017-03-16 NOTE — Plan of Care (Signed)
Problem: RH BOWEL ELIMINATION Goal: RH STG MANAGE BOWEL WITH ASSISTANCE STG Manage Bowel with mod  Assistance.    Outcome: Progressing No bowel issues reported  Problem: RH BLADDER ELIMINATION Goal: RH STG MANAGE BLADDER WITH ASSISTANCE STG Manage Bladder With Mod Assistance   Outcome: Progressing Minimum assistance to W/C to toilet, voiding without difficulty  Problem: RH SKIN INTEGRITY Goal: RH STG SKIN FREE OF INFECTION/BREAKDOWN No new skin breakdown or infection while on rehab with min assist from staff.  Outcome: Progressing No skin breakdown or infection noted Goal: RH STG ABLE TO PERFORM INCISION/WOUND CARE W/ASSISTANCE STG Able To Perform Incision/Wound Care With max Assistance.   Outcome: Not Progressing Unable to perform own skin/incision care  Problem: RH SAFETY Goal: RH STG ADHERE TO SAFETY PRECAUTIONS W/ASSISTANCE/DEVICE STG Adhere to Safety Precautions With Max Assistance/Device.   Outcome: Progressing Safety precautions and fall prevention maintained Goal: RH STG DECREASED RISK OF FALL WITH ASSISTANCE STG Decreased Risk of Fall With Max Assistance.   Outcome: Progressing No fall or injury this shift

## 2017-03-16 NOTE — Progress Notes (Addendum)
Physical Therapy Weekly Progress Note  Patient Details  Name: [name] MRN: [id number] Date of Birth: 04/05/1966  Beginning of progress report period: Mar 09, 2016 End of progress report period: Mar 16, 2017  Today's Date: 03/16/2017  Patient has met 1 of 1 short term goals. Pt's sister [name]) has been participating in hands-on caregiver training for ambulation & transfers but would benefit from continued practice to progress towards stair negotiation. Pt is making progress in improved balance as he is able to negotiate a flight of stairs with mod/max assist and single rail with MAX cuing. Pt would benefit from continued skilled PT treatment for endurance & strength training, NMR, balance, and cognitive remediation.   Patient continues to demonstrate the following deficits muscle weakness, decreased cardiorespiratoy endurance, motor apraxia and decreased coordination, decreased visual perceptual skills, decreased attention to left, decreased initiation, decreased attention, decreased awareness, decreased problem solving, decreased safety awareness, decreased memory and delayed processing, and decreased standing balance, decreased postural control, hemiplegia and decreased balance strategies and therefore will continue to benefit from skilled PT intervention to increase functional independence with mobility.  Patient progressing toward long term goals..  Continue plan of care.  PT Short Term Goals Week 6:  PT Short Term Goal 1 (Week 6): STG = LTG due to estimated d/c date. Week 7:  PT Short Term Goal 1 (Week 7): STG = LTG due to estimated d/c date.   Therapy Documentation Precautions:  Precautions Precautions: Fall, Other (comment) Precaution Comments: bone flap removed R side of head in abdomen; hemet for OOB; Pt no longer with NG or PICC; enclosure bed Required Braces or Orthoses: Other Brace/Splint Other Brace/Splint: helmet for OOB Restrictions Weight Bearing  Restrictions: No  See Function Navigator for Current Functional Status.  Therapy/Group: Individual Therapy  [name] 03/16/2017, 2:43 PM

## 2017-03-17 ENCOUNTER — Inpatient Hospital Stay (HOSPITAL_COMMUNITY): Payer: Self-pay | Admitting: Speech Pathology

## 2017-03-17 ENCOUNTER — Inpatient Hospital Stay (HOSPITAL_COMMUNITY): Payer: Self-pay | Admitting: Physical Therapy

## 2017-03-17 ENCOUNTER — Inpatient Hospital Stay (HOSPITAL_COMMUNITY): Payer: Self-pay | Admitting: Occupational Therapy

## 2017-03-17 ENCOUNTER — Inpatient Hospital Stay (HOSPITAL_COMMUNITY): Payer: Self-pay

## 2017-03-17 LAB — GLUCOSE, CAPILLARY
GLUCOSE-CAPILLARY: 129 mg/dL — AB (ref 65–99)
GLUCOSE-CAPILLARY: 131 mg/dL — AB (ref 65–99)
Glucose-Capillary: 118 mg/dL — ABNORMAL HIGH (ref 65–99)
Glucose-Capillary: 96 mg/dL (ref 65–99)

## 2017-03-17 NOTE — Progress Notes (Signed)
Occupational Therapy Weekly Progress Note  Patient Details  Name: [name] MRN: [id number] Date of Birth: Mar 17, 1966  Beginning of progress report period: Mar 08, 2017 End of progress report period: Mar 17, 2017  Today's Date: 03/17/2017 OT Individual Time: 3825-0539 and 1300-1330 OT Individual Time Calculation (min): 55 min and 30 min    Patient has met  6 of 11 long term goals. Pt is making steady progress towards long term occupational therapy goals. His sister has been present during several sessions for family education and continues to build confidence to assist pt with functional tasks. She has improved her ability to give specific cues when needed. Education to continue. Pt performs functional transfers and ambulation with min hand held assistance. Pt requires min - mod cues for problem solving tasks depending on environment as well. Pt continues to benefit from OT intervention.   Patient continues to demonstrate the following deficits: muscle weakness, decreased cardiorespiratoy endurance, L inattention, decreased initiation, decreased attention, decreased awareness, decreased problem solving, decreased safety awareness, decreased memory and delayed processing and decreased sitting balance, decreased standing balance and decreased balance strategies and therefore will continue to benefit from skilled OT intervention to enhance overall performance with BADL.  See Patient's Care Plan for progression toward long term goals.  Patient progressing toward long term goals..  Continue plan of care.  Skilled Therapeutic Interventions/Progress Updates:   Session 1: Upon entering the room, pt supine in bed with no c/o pain and offers greeting to therapist. Pt ambulating to dresser to obtain clothing items with min hand held assist. Pt ambulating to shower with min A for transfer. Pt removing clothing items with increased time and min tactile cues. Pt bathing self in bathroom with min  cues for sequencing of task. Exiting the bathroom, pt sitting on EOB to don clothing items. He initially put shoes on wrong foot and required min verbal guidance cues to correct. Pt able to tie B shoes as well. Pt ambulating to day room and taking seated rest break. Pt then able to verbalize room number and able to locate room without any cues from therapist this session. Pt returned to bed to rest with all needed items within reach. Enclosure bed secured.   Session 2: Upon entering the room, pt in bed with no c/o pain this session. Pt oriented to location and correct month this session. Pt taken to stand in front of mirror and helmet removed. When asked why he was here pt states, " because of the accident. I hit my head." Pt showing increased orientation and awareness. Pt ambulated with min hand held assistance to day room. Pt seated at table for table top activity. Pt verbally named each color of peg correctly 5/5 times. Therapist placed 5 colored pegs in row and provided pt same number and color pegs and asked to duplicate which pt did with min cues. OT showing pt picture of red and blue alternating pegs placed in peg board as well as providing demonstration. Pt able to replicate this pattern as well with increased time and min verbal cues. Pt returning to room at end of session and back to bed. Bed secured upon exiting.   Therapy Documentation Precautions:  Precautions Precautions: Fall, Other (comment) Precaution Comments: bone flap removed R side of head in abdomen; hemet for OOB; Pt no longer with NG or PICC; enclosure bed Required Braces or Orthoses: Other Brace/Splint Other Brace/Splint: helmet for OOB Restrictions Weight Bearing Restrictions: No  See Function Navigator for  Current Functional Status.   Therapy/Group: Individual Therapy  Gypsy Decant 03/17/2017, 8:57 AM

## 2017-03-17 NOTE — Progress Notes (Signed)
Physical Therapy Session Note  Patient Details  Name: Jamie BeckwithMiguel Garcia Berg MRN: [id number] Date of Birth: 10/15/1966  Today's Date: 03/17/2017 PT Individual Time: 1005-1100 PT Individual Time Calculation (min): 55 min   Short Term Goals: Week 7:  PT Short Term Goal 1 (Week 7): STG = LTG due to estimated d/c date.  Skilled Therapeutic Interventions/Progress Updates:  Pt received in bed & agreeable to tx. Pt able to transfer to EOB & don socks & shoes with supervision. Session focused on NMR, stair training, and cognitive remediation. Pt asking when he was going to get paid because he's been here 2 weeks; therapist provided orientation. Immediately following this conversation pt asked if we were going to go up/down the stairs again today, which is improved carryover. Pt ambulated throughout unit with min assist overall, with ability to path find dayroom>room at end of session. Pt negotiated 2 flights of stairs with L rail only, min assist, and without verbal cuing for pattern and safety on this date. Pt still requires cuing to turn and descend stairs and for re-direction. Pt utilized nu-step on level 5 x 10 minutes with all 4 extremities with task focusing on coordination of reciprocal movements and attention to task; pt requires max cuing to terminate task at appropriate time & pt able to do so. Pt able to recall all but 4 correct names of small plastic animals with moderate cuing/hints. Pt reported HA with cognitive task & RN made aware. At end of session pt left sitting in w/c with QRB donned & at nurses station.   Therapy Documentation Precautions:  Precautions Precautions: Fall, Other (comment) Precaution Comments: bone flap removed R side of head in abdomen; hemet for OOB; Pt no longer with NG or PICC; enclosure bed Required Braces or Orthoses: Other Brace/Splint Other Brace/Splint: helmet for OOB Restrictions Weight Bearing Restrictions: No   See Function Navigator for Current  Functional Status.   Therapy/Group: Individual Therapy  Sandi MariscalVictoria M Kimya Mccahill 03/17/2017, 12:34 PM

## 2017-03-17 NOTE — Progress Notes (Signed)
PHYSICAL MEDICINE & REHABILITATION     PROGRESS NOTE    Subjective/Complaints: No new complaints. Slept well in enclosure bed  ROS: Limited due cognitive/behavioral    Objective: Vital Signs: Blood pressure 120/63, pulse 80, temperature 97.7 F (36.5 C), temperature source Oral, resp. rate 18, height 5\' 2"  (1.575 m), weight 60.4 kg (133 lb 1.6 oz), SpO2 100 %. No results found. No results for input(s): WBC, HGB, HCT, PLT in the last 72 hours. No results for input(s): NA, K, CL, GLUCOSE, BUN, CREATININE, CALCIUM in the last 72 hours.  Invalid input(s): CO CBG (last 3)   Recent Labs  03/16/17 1656 03/16/17 2048 03/17/17 0643  GLUCAP 111* 156* 129*    Wt Readings from Last 3 Encounters:  03/15/17 60.4 kg (133 lb 1.6 oz)  02/01/17 65.6 kg (144 lb 9.6 oz)  07/31/16 68 kg (150 lb)    Physical Exam:    Gen: NAD. Well developed.  HENT:  Craniectomy scar  Cardiovascular:  RRR Respiratory:  CTA B GI: Soft. Bowel sounds are normal. Neurological:   Spontaneously moving all 4 extremities. More attentive to left Psychiatric:   Flat but very cooperative, pleasant Skin. Warm and dry. Crani wound without drainage. Areas of scab diminishing   Assessment/Plan: 1. Cognitive and functional deficits secondary to right traumatic ICH/SDH s/p craniectomy which require 3+ hours per day of interdisciplinary therapy in a comprehensive inpatient rehab setting. Physiatrist is providing close team supervision and 24 hour management of active medical problems listed below. Physiatrist and rehab team continue to assess barriers to discharge/monitor patient progress toward functional and medical goals.  Function:  Bathing Bathing position   Position: Standing at sink  Bathing parts Body parts bathed by patient: Right arm, Left arm, Chest, Abdomen, Right upper leg, Left upper leg, Front perineal area, Buttocks Body parts bathed by helper: Back  Bathing assist Assist Level:  Touching or steadying assistance(Pt > 75%)      Upper Body Dressing/Undressing Upper body dressing   What is the patient wearing?: Pull over shirt/dress     Pull over shirt/dress - Perfomed by patient: Thread/unthread right sleeve, Thread/unthread left sleeve, Put head through opening, Pull shirt over trunk Pull over shirt/dress - Perfomed by helper: Pull shirt over trunk        Upper body assist Assist Level: Supervision or verbal cues   Set up : To obtain clothing/put away  Lower Body Dressing/Undressing Lower body dressing   What is the patient wearing?: Socks, Shoes Underwear - Performed by patient: Thread/unthread right underwear leg, Thread/unthread left underwear leg, Pull underwear up/down   Pants- Performed by patient: Thread/unthread right pants leg, Thread/unthread left pants leg, Pull pants up/down Pants- Performed by helper: Pull pants up/down Non-skid slipper socks- Performed by patient: Don/doff left sock, Don/doff right sock Non-skid slipper socks- Performed by helper: Don/doff right sock, Don/doff left sock Socks - Performed by patient: Don/doff right sock, Don/doff left sock Socks - Performed by helper: Don/doff right sock, Don/doff left sock Shoes - Performed by patient: Don/doff right shoe, Don/doff left shoe Shoes - Performed by helper: Fasten right, Fasten left     TED Hose - Performed by patient: Don/doff right TED hose TED Hose - Performed by helper: Don/doff right TED hose, Don/doff left TED hose  Lower body assist Assist for lower body dressing: Touching or steadying assistance (Pt > 75%)      Toileting Toileting Toileting activity did not occur: Safety/medical concerns Toileting steps completed by patient: Adjust  clothing prior to toileting, Performs perineal hygiene, Adjust clothing after toileting Toileting steps completed by helper: Adjust clothing after toileting Toileting Assistive Devices: Grab bar or rail  Toileting assist Assist level:  Touching or steadying assistance (Pt.75%)   Transfers Chair/bed transfer Chair/bed transfer activity did not occur:  (standard chair to w/c) Chair/bed transfer method: Ambulatory Chair/bed transfer assist level: Touching or steadying assistance (Pt > 75%) Chair/bed transfer assistive device: Armrests Mechanical lift: Scientist, water qualityara   Locomotion Ambulation Ambulation activity did not occur: Safety/medical concerns   Max distance: 150 ft  Assist level: Touching or steadying assistance (Pt > 75%)   Wheelchair Wheelchair activity did not occur: Safety/medical concerns   Max wheelchair distance: 10 Assist Level: Dependent (Pt equals 0%)  Cognition Comprehension Comprehension assist level: Understands basic 50 - 74% of the time/ requires cueing 25 - 49% of the time  Expression Expression assist level: Expresses basic 75 - 89% of the time/requires cueing 10 - 24% of the time. Needs helper to occlude trach/needs to repeat words.  Social Interaction Social Interaction assist level: Interacts appropriately 75 - 89% of the time - Needs redirection for appropriate language or to initiate interaction.  Problem Solving Problem solving assist level: Solves basic 25 - 49% of the time - needs direction more than half the time to initiate, plan or complete simple activities  Memory Memory assist level: Recognizes or recalls 25 - 49% of the time/requires cueing 50 - 75% of the time  Medical Problem List and Plan: 1. 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 DVT Prophylaxis/Anticoagulation: Subcutaneous heparin. Monitor platelet counts and any signs of bleeding. 3. Pain Management: Hycet  4. Mood: Ativan as needed 5. Neuropsych: This patient is notcapable of making decisions on hisown behalf.  -HS seroquel for  sleep/agitation, reduced to 25mg --decrease further to 12 today  -limiting benzos  -continue ritalin for arousal, attention and initiation-   15mg  bid  -enclosure bed for safety still required, renewed.  Continue d/t fall risk cranial defect  -soft helmet for safety 6. Skin/Wound Care: Routine skin checks  -helmet for cranium protection  -flap site intact  -crani incision much improved---only minor scabs   -for potential cranioplasty Friday per Dr. Yetta BarreJones 7. Fluids/Electrolytes/Nutrition:    -intake reasonable 8.Seizure prophylaxis. Keppra 500 mg twice daily. Hesitate to decrease further given craniectomy 9.Pneumonia. resolved 10.Dysphagia. On D1 thins. Taking meds without issues 11.Urinary retention/recent Escherichia coli UTI s/p course of Cipro.   - repeat urine culture with 100k coag neg staph--- macrobid completed  -decrease urecholine to 10mg  TID--stop today  -continue timed voids   12.Diabetes mellitus with peripheral neuropathy. Hemoglobin A1c 10.4.  Patient on Glucophage 500 mg twice a day and Glucotrol 5 mg daily at home  -lantus 15u qhs, glipizide 5 mg--tight control    13.History of hypertension. Patient on lisinopril 2.5 mg daily prior to admission. Resume if >130/85  Controlled 5/7   Vitals:   03/16/17 1603 03/17/17 0433  BP: 123/60 120/63  Pulse: 76 80  Resp: 18 18  Temp: 97.8 F (36.6 C) 97.7 F (36.5 C)   14.Constipation. Laxative assistance     LOS (Days) 44 A FACE TO FACE EVALUATION WAS PERFORMED  Ranelle OysterSWARTZ,ZACHARY T, MD 03/17/2017 8:55 AM

## 2017-03-17 NOTE — Progress Notes (Signed)
Speech Language Pathology Daily Session Note  Patient Details  Name: Jamie BeckwithMiguel Garcia Berg MRN: [id number] Date of Birth: 10/04/1966  Today's Date: 03/17/2017 SLP Individual Time: 1100-1155 SLP Individual Time Calculation (min): 55 min  Short Term Goals: Week 7: SLP Short Term Goal 1 (Week 7): Patient will consume current diet with minimal overt s/s of aspiration with supervision verbal cues for use of swallowing compensatory strategies.  SLP Short Term Goal 2 (Week 7): Patient will demonstrate selective attention to a functional task for ~15 minutes with Mod A verbal cues for redirection in a mildly distracting enviornment.  SLP Short Term Goal 3 (Week 7): Patient will demonstrate basic problem solving for basic tasks with Mod A verbal cues.  SLP Short Term Goal 4 (Week 7): Patient will orient to place, situation and time with Min  A multimodal cues.  SLP Short Term Goal 5 (Week 7): Patient will identify 2 tasks/activities he can participate in safely at home with focus on anticipatory awareness with Max A multimodal cues.   Skilled Therapeutic Interventions:  Pt was seen for skilled ST targeting cognitive goals.  SLP facilitated the session with a basic medication management task targeting goals for attention, problem solving, and memory.  Pt initially needed max to total assist verbal, visual, and tactile cues for task organization due to impulsivity; however, as task progressed therapist was able to fade cues to mod-max assist verbal and visual cues.  Pt needed mod-max assist question cues to identify safety precautions and their rationale (i.e. Helmet when OOB, quick release belt, shoes on while transferring back to bed).  Pt also needed mod-max assist to return demonstration of use of call bell.  Pt left in enclosure bed with call bell within reach.  Continue per current plan of care.       Function:  Eating Eating                 Cognition Comprehension Comprehension assist  level: Understands basic 50 - 74% of the time/ requires cueing 25 - 49% of the time  Expression   Expression assist level: Expresses basic 75 - 89% of the time/requires cueing 10 - 24% of the time. Needs helper to occlude trach/needs to repeat words.  Social Interaction Social Interaction assist level: Interacts appropriately 25 - 49% of time - Needs frequent redirection.  Problem Solving Problem solving assist level: Solves basic 25 - 49% of the time - needs direction more than half the time to initiate, plan or complete simple activities  Memory Memory assist level: Recognizes or recalls 25 - 49% of the time/requires cueing 50 - 75% of the time    Pain Pain Assessment Pain Assessment: Faces Pain Score: 6  Faces Pain Scale: Hurts even more Pain Type: Acute pain Pain Location: Head Pain Descriptors / Indicators: Aching Pain Frequency: Occasional Pain Onset: Gradual Patients Stated Pain Goal: 2 Pain Intervention(s): RN made aware Multiple Pain Sites: No  Therapy/Group: Individual Therapy  Maven Rosander, [name] 03/17/2017, 12:05 PM

## 2017-03-17 NOTE — Progress Notes (Signed)
Physical Therapy Session Note  Patient Details  Name: Loni BeckwithMiguel Garcia Cardenas MRN: [id number] Date of Birth: 1966/05/28  Today's Date: 03/17/2017 PT Individual Time: 1330-1430 PT Individual Time Calculation (min): 60 min   Short Term Goals: Week 7:  PT Short Term Goal 1 (Week 7): STG = LTG due to estimated d/c date.  Skilled Therapeutic Interventions/Progress Updates: Pt presented in w/c with sister present. PTA providing minA for ambulation with cues for pacing and posture. Ascend/descend x 24 stairs with 1 rail ascending and mod cues for sequencing (via interpreter). Pt required modA with pt placing LUE on PTA and PTA providing max verbal cues for sequencing. Pt continued ambulation in controlled environment with PTA providing min guard then switching off to sister [name]([name]) proving cues for pacing pt and postural cues. Transported pt outside and performed ambulation on uneven surface with min guard. Performed ambulation in bouts of approx 23100ft with PTA and sister both with minA with cues for pacing and posture. Continued edu to sister for stopping pt if necessary for re-direction and/or correction for safety. Pt through main entrance of hospital with minA in moderately distracting environment maintaining to task and required x 3 seated rest breaks returning to rehab gym. NuStep L4 x 8 min for attention to task. Returned to room with pt returning to bed (open) with sister present.      Therapy Documentation Precautions:  Precautions Precautions: Fall, Other (comment) Precaution Comments: bone flap removed R side of head in abdomen; hemet for OOB; Pt no longer with NG or PICC; enclosure bed Required Braces or Orthoses: Other Brace/Splint Other Brace/Splint: helmet for OOB Restrictions Weight Bearing Restrictions: No General:   Vital Signs: Therapy Vitals Temp: 98.6 F (37 C) Temp Source: Oral Pulse Rate: 66 Resp: 17 BP: 110/66 Patient Position (if appropriate): Lying Oxygen  Therapy SpO2: 100 % O2 Device: Not Delivered   See Function Navigator for Current Functional Status.   Therapy/Group: Individual Therapy  Yanelie Abraha  Eliana Lueth, PTA  03/17/2017, 4:09 PM

## 2017-03-17 NOTE — Progress Notes (Signed)
Speech Language Pathology Weekly Progress Note  Patient Details  Name: [name] MRN: [id number] Date of Birth: 1966/10/05  Beginning of progress report period: Mar 09, 2017 End of progress report period: Mar 17, 2017   Short Term Goals: Week 6: SLP Short Term Goal 1 (Week 6): Patient will consume current diet with minimal overt s/s of aspiration with supervision verbal cues for use of swallowing compensatory strategies.  SLP Short Term Goal 1 - Progress (Week 6): Not met SLP Short Term Goal 2 (Week 6): Patient will demonstrate efficient mastication with mild oral residue and minimal overt s/s of aspiration with Mod A verbal cues over 2 sessions prior to upgrade.  SLP Short Term Goal 2 - Progress (Week 6): Met SLP Short Term Goal 3 (Week 6): Patient will demonstrate selective attention to a functional task for ~10 minutes with Mod A verbal cues for redirection in a mildly distracting enviornment.  SLP Short Term Goal 3 - Progress (Week 6): Met SLP Short Term Goal 4 (Week 6): Patient will orient to place, situation and time with Min  A multimodal cues.  SLP Short Term Goal 4 - Progress (Week 6): Not met SLP Short Term Goal 5 (Week 6): Patient will demonstrate basic problem solving for basic tasks with Mod A verbal cues.  SLP Short Term Goal 5 - Progress (Week 6): Met    New Short Term Goals: Week 7: SLP Short Term Goal 1 (Week 7): Patient will consume current diet with minimal overt s/s of aspiration with supervision verbal cues for use of swallowing compensatory strategies.  SLP Short Term Goal 2 (Week 7): Patient will demonstrate selective attention to a functional task for ~15 minutes with Mod A verbal cues for redirection in a mildly distracting enviornment.  SLP Short Term Goal 3 (Week 7): Patient will demonstrate basic problem solving for basic tasks with Mod A verbal cues.  SLP Short Term Goal 4 (Week 7): Patient will orient to place, situation and time with Min  A  multimodal cues.  SLP Short Term Goal 5 (Week 7): Patient will identify 2 tasks/activities he can participate in safely at home with focus on anticipatory awareness with Max A multimodal cues.   Weekly Progress Updates: Patient has made functional gains and has met 3 of 5STG's this reporting period. Currently, patient demonstrates behaviors consistent with a Rancho Level VI and requires overall Mod-Max A cues to complete functional and familiar tasks safely in regards to orientation, problem solving, selective attention and awareness.  Patient is consuming Dys. 2 textures with thin liquids with minimal overt s/s of aspiration andrequires Min A verbal cues for use of swallowing compensatory strategies.  Patient and family education is ongoing. Patient would benefit from continued skilled SLP intervention to maximize his cognitive and swallowing function and overall functional independence prior to discharge.      Intensity: Minumum of 1-2 x/day, 30 to 90 minutes Frequency: 3 to 5 out of 7 days Duration/Length of Stay: TBD due to potential surgery to replace skull flap  Treatment/Interventions: Cueing hierarchy;Dysphagia/aspiration precaution training;Internal/external aids;Environmental controls;Therapeutic Activities;Patient/family education;Functional tasks;Cognitive remediation/compensation    Chezney Huether 03/17/2017, 6:59 AM

## 2017-03-17 NOTE — Progress Notes (Signed)
RN contacted Dr. Marikay Alaravid Jones office about informed consent for the cranioplasty.Pt's sister has been asking for information about the surgery.RN left a message at Dr. Yetta BarreJones office with Mrs. [name]'s telephone number.

## 2017-03-18 ENCOUNTER — Inpatient Hospital Stay (HOSPITAL_COMMUNITY): Payer: Self-pay | Admitting: Speech Pathology

## 2017-03-18 ENCOUNTER — Inpatient Hospital Stay (HOSPITAL_COMMUNITY): Payer: Self-pay | Admitting: Occupational Therapy

## 2017-03-18 ENCOUNTER — Inpatient Hospital Stay (HOSPITAL_COMMUNITY): Payer: Self-pay | Admitting: Physical Therapy

## 2017-03-18 LAB — GLUCOSE, CAPILLARY
GLUCOSE-CAPILLARY: 112 mg/dL — AB (ref 65–99)
GLUCOSE-CAPILLARY: 77 mg/dL (ref 65–99)
Glucose-Capillary: 170 mg/dL — ABNORMAL HIGH (ref 65–99)
Glucose-Capillary: 107 mg/dL — ABNORMAL HIGH (ref 65–99)

## 2017-03-18 MED ORDER — INSULIN GLARGINE 100 UNIT/ML ~~LOC~~ SOLN
10.0000 [IU] | Freq: Every day | SUBCUTANEOUS | Status: DC
  Administered 2017-03-18: 10 [IU] via SUBCUTANEOUS
  Filled 2017-03-18: qty 0.1

## 2017-03-18 MED ORDER — GLIPIZIDE 5 MG PO TABS
5.0000 mg | ORAL_TABLET | Freq: Two times a day (BID) | ORAL | Status: DC
  Administered 2017-03-18: 5 mg via ORAL
  Filled 2017-03-18: qty 1

## 2017-03-18 NOTE — Discharge Summary (Signed)
NAMESAUD, BAIL      ACCOUNT NO.:  [id number]  MEDICAL RECORD NO.:  [id number]  LOCATION:  4W12C                        FACILITY:  [facility]  PHYSICIAN:  [name], M.D.DATE OF BIRTH:  1966/10/07  DATE OF ADMISSION:  02/01/2017 DATE OF DISCHARGE:  03/19/2017                              DISCHARGE SUMMARY   DISCHARGE DIAGNOSES: 1. Right intraparenchymal hemorrhage - subdural hematoma - temporal     bone fracture after fall, status post right frontotemporoparietal     craniectomy January 14, 2017. 2. Subcutaneous heparin for deep venous thrombosis prophylaxis. 3. Mood. 4. Seizure prophylaxis. 5. Pneumonia. 6. Dysphagia. 7. Urinary retention with Escherichia coli urinary tract infection. 8. Diabetes mellitus. 9. History of hypertension. 10.Constipation.  HISTORY OF PRESENT ILLNESS:  This is a 51 year old right-handed, limited English-speaking male, lives with a sister, independent prior to admission.  Presented January 10, 2017, after being found down from a supposed fall.  CT of the head and imaging revealed intracranial hemorrhage with a 4.5 cm right frontal intraparenchymal hemorrhage, 6 mm right subdural hematoma, and other scattered areas of intraparenchymal and subarachnoid hemorrhage with right-to-left midline shift. Nondisplaced vertical fracture through the left temporal bone.  CT cervical spine negative.  Urine drug screen negative.  Neurosurgery consulted initially with conservative care.  ICP monitor.  ENT, Dr. Jenne Pane, for temporal bone fracture.  No surgical intervention.  CT imaging showed increased right to left greater than 1 cm shift, basal cisterns slightly effaced.  Underwent right frontotemporoparietal craniectomy for hematoma evacuation January 14, 2017, per Dr. Franky Macho with placement of bone flap into the abdomen.  Keppra for seizure prophylaxis.  Extubated January 19, 2017, nasogastric tube for nutritional support.  Subcutaneous heparin for DVT  prophylaxis later initiated. Bouts of urinary retention as well as E. coli urinary tract infection, placed on Cipro.  Physical and occupational therapy ongoing.  The patient was admitted for a comprehensive rehab program.  PAST MEDICAL HISTORY:  See discharge diagnoses.  SOCIAL HISTORY:  Lives with sister, independent prior to admission.  FUNCTIONAL STATUS UPON ADMISSION TO REHAB SERVICES:  Total assist, sit to supine, supine to sit.  Max total assist activities of daily living.  PHYSICAL EXAMINATION:  VITAL SIGNS:  Blood pressure 128/77, pulse 85, temperature 98, and respirations 19. GENERAL:  This was a restless male, moving all extremities.  He did have some spontaneous speech, primarily [place].  He was able to say his name. HEENT:  Pupils round and reactive to light.  Craniotomy site clean and dry.  Nasogastric tube in place. LUNGS:  Clear to auscultation without wheeze. CARDIAC:  Regular rate and rhythm. ABDOMEN:  Soft, nontender.  Good bowel sounds.  REHABILITATION HOSPITAL COURSE:  The patient was admitted to inpatient rehab services with therapies initiated on a 3-hour daily basis, consisting of physical therapy, occupational therapy, speech therapy, and rehabilitation nursing.  Following issues were addressed during the patient's rehabilitation stay.  Pertaining to [name] intraparenchymal hemorrhage, subdural hematoma with temporal bone fracture, he had undergone right frontotemporoparietal craniectomy January 14, 2017, placement of bone flap in the abdomen.  Plan was for cranioplasty, the patient to be discharged to acute care services, Dr. Marikay Alar, Mar 19, 2017, for planned procedure.  He had  been on subcutaneous heparin for DVT prophylaxis, no bleeding episodes.  He continued on Keppra for seizure prophylaxis as well as initiation of Ritalin to help the patient focus to tasks and improved awareness.  His diet had been advanced to a dysphagia 2 thin liquid  diet.  He completed a course of Cipro for E. coli urinary tract infection as well as voiding much improved.  His Urecholine had been discontinued.  Blood sugars overall controlled, monitored hemoglobin A1c of 10.4.  Attempts to wean his insulin maintained oral agents.  Blood pressures well controlled. The patient received weekly collaborative interdisciplinary team conferences to discuss estimated length of stay, family teaching, any barriers to his discharge.  Minimal assist for ambulation with cuing. Up and down stairs.  Moderate cues, moderate assist for patient placing left upper extremity on prior to admission providing max verbal cues for sequencing, ambulating up to 200 feet.  He did have a helmet in place for protection.  Speech therapy continued to follow with the assistance of an interpreter.  He was able to communicate his basic needs and express himself, although needing ongoing monitoring for his safety. Plan was to be discharged to acute care services Mar 19, 2017, for cranioplasty.  He could be readmitted back to inpatient rehab Services once medically stable as needed.  DISCHARGE MEDICATIONS:  Colace 100 mg p.o. daily, Glucotrol 5 mg p.o. b.i.d., Lantus insulin 10 units at bedtime, Keppra 500 mg p.o. b.i.d., Ritalin 10 mg p.o. b.i.d., Seroquel 12.5 mg p.o. at bedtime, and hydrocodone 1 tablet p.o. every 6 hours as needed pain.  DIET:  His diet was a dysphagia 2 thin liquid diet.     [name], P.A.   ______________________________ Ranelle OysterZachary T. Swartz, M.D.    DA/MEDQ  D:  03/18/2017  T:  03/18/2017  Job:  [id number]  cc:   Tia Alertavid S. Jones, MD Antony Contraswight D Bates, MD Ranelle OysterZachary T. Swartz, M.D.

## 2017-03-18 NOTE — Progress Notes (Signed)
Occupational Therapy Note  Patient Details  Name: Loni BeckwithMiguel Garcia Cardenas MRN: [id number] Date of Birth: 29-Oct-1966  Today's Date: 03/18/2017 OT Individual Time: 1300-1330 OT Individual Time Calculation (min): 30 min   No c/o pain 1:1 Therapeutic activity with focus on functional ambulation with close supervision to steadying A. Engaged in visual and motor task on the Dynavision with isolated use of left and then right UE. Pt demonstrated improved visual tracking and sustained attention to task evident by his reaction time. Pt also with improved accuracy of left UE. Able to advance task by standing on foam to challenge balance. Pt able ot maintain balance with close supervision. Pt able ot navigate back to room with steadying A without verbal cues about his environment. Pt continues to have difficulty with body awarness demonstrating by getting on the foam surface and getting into the bed at end of session.   Roney MansSmith, Roxanna Mcever [facility] 03/18/2017, 4:31 PM

## 2017-03-18 NOTE — Plan of Care (Signed)
Problem: RH BOWEL ELIMINATION Goal: RH STG MANAGE BOWEL WITH ASSISTANCE STG Manage Bowel with mod  Assistance.    Outcome: Not Progressing LBM 03-15-15 Goal: RH STG MANAGE BOWEL W/MEDICATION W/ASSISTANCE STG Manage Bowel with Medication with Mod Assistance.   Outcome: Not Progressing LBM 03-14-17

## 2017-03-18 NOTE — Progress Notes (Signed)
Social Work Patient ID: Jamie BeckwithMiguel Garcia Berg, male   DOB: September 29, 1966, 51 y.o.   MRN: [id number]   Joined MD this morning as he was discussing NS plans for cranioplasty tomorrow.  MD plans for pt to return to CIR and speculates ~ 1 week further of training and then home with sister.  He is very direct with sister that this is the plan and further stay beyond this is not anticipated.  She states that she understands this plan.  Have placed hold on therapies with tentative plan to resume on Monday as pt likely back on CIR at that point.  Continue to follow.  Keniesha Adderly, LCSW

## 2017-03-18 NOTE — Plan of Care (Signed)
Problem: RH BOWEL ELIMINATION Goal: RH STG MANAGE BOWEL WITH ASSISTANCE STG Manage Bowel with mod  Assistance.    Outcome: Not Progressing LBM 5/13; Dan PA notified claims patient will be npo tonight so we'll leave it for now. Patient has senokot and colace daily

## 2017-03-18 NOTE — Progress Notes (Signed)
Speech Language Pathology Daily Session Note  Patient Details  Name: Jamie BeckwithMiguel Garcia Cardenas MRN: [id number] Date of Birth: 01-04-1966  Today's Date: 03/18/2017 SLP Individual Time: 1005-1105 SLP Individual Time Calculation (min): 60 min  Short Term Goals: Week 7: SLP Short Term Goal 1 (Week 7): Patient will consume current diet with minimal overt s/s of aspiration with supervision verbal cues for use of swallowing compensatory strategies.  SLP Short Term Goal 2 (Week 7): Patient will demonstrate selective attention to a functional task for ~15 minutes with Mod A verbal cues for redirection in a mildly distracting enviornment.  SLP Short Term Goal 3 (Week 7): Patient will demonstrate basic problem solving for basic tasks with Mod A verbal cues.  SLP Short Term Goal 4 (Week 7): Patient will orient to place, situation and time with Min  A multimodal cues.  SLP Short Term Goal 5 (Week 7): Patient will identify 2 tasks/activities he can participate in safely at home with focus on anticipatory awareness with Max A multimodal cues.   Skilled Therapeutic Interventions: Skilled treatment session focused on dysphagia and cognition goals. Interpreter present. SLP facilitated session by providing skilled observation of pt consuming trial snack of dysphagia 3 (graham crackers and peanut butter) with thin liquids via straw. Pt required Mod A to control rate and bite size. As trial continued, pt with right buccal residue and required Mod A visual and verbal cues to perform lingual sweep. Pt is not a candidate for further upgrade at this time d/t cognitive difficulty controlling rate/bolus size. Pt required Mod A to complete peg replication task but he was able to demonstrate selective attention to this tasks in a moderately distracting environment for ~ 20 minutes with Min A verbal cues. Pt was returned to room, left in enclosure bed with sister present. Continue per current plan of care.       Function:  Eating Eating   Modified Consistency Diet: Yes Eating Assist Level: Set up assist for;Supervision or verbal cues           Cognition Comprehension Comprehension assist level: Understands basic 50 - 74% of the time/ requires cueing 25 - 49% of the time  Expression   Expression assist level: Expresses basic 75 - 89% of the time/requires cueing 10 - 24% of the time. Needs helper to occlude trach/needs to repeat words.  Social Interaction Social Interaction assist level: Interacts appropriately 25 - 49% of time - Needs frequent redirection.  Problem Solving Problem solving assist level: Solves basic 25 - 49% of the time - needs direction more than half the time to initiate, plan or complete simple activities  Memory Memory assist level: Recognizes or recalls 25 - 49% of the time/requires cueing 50 - 75% of the time    Pain    Therapy/Group: Individual Therapy  Shuan Statzer 03/18/2017, 11:46 AM

## 2017-03-18 NOTE — Discharge Summary (Signed)
Discharge summary job [id number]

## 2017-03-18 NOTE — Patient Care Conference (Signed)
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 Essential hypertension   . Agitated   . Lethargy   . Benign essential HTN   . Poorly controlled type 2 diabetes mellitus with peripheral neuropathy (HCC)   . 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 Aphasia 02/01/2017  . Dysphagia, oropharyngeal 02/01/2017  . Intracerebral hemorrhage (HCC) 01/14/2017  . Fall 01/10/2017  . Subdural hemorrhage (HCC) 01/10/2017  . Diabetes type 2, uncontrolled (HCC) 06/13/2015    Expected Discharge Date: Expected Discharge Date:  (TBD)  Team Members Present: Physician leading conference: Dr. Faith RogueZachary Swartz Social Worker Present: Amada JupiterLucy Yanni Ruberg, LCSW Nurse Present: Carmie EndAngie Joyce, RN PT Present: Katherine Mantleodney Wishart, PT;Victoria Hyacinth MeekerMiller, PT OT Present: Callie FieldingKatie Pittman, OT SLP Present: Feliberto Gottronourtney Payne, SLP PPS Coordinator present : Tora DuckMarie Noel, RN, CRRN     Current Status/Progress Goal Weekly Team Focus  Medical   improve awareness and insight but still unsafe. req enclosure bed for safety  improve safety awareness and insight  bladder mgt, flap replacdement   Bowel/Bladder   Continent of of bowel and bladder with timed toileting with one helper's assistance to BR and uses toilet  continue to be continent of bowel and bladder  continue to assist with  toileting and every shift and PRN   Swallow/Nutrition/ Hydration   Dys. 2 textures with thin liquids, Min A  Min A with least restrictive diet  use of swallowing strategies    ADL's   Min A bathing at shower level, supervision UB dressing, Min A LB dressing, Min-Mod A HHA for bathroom transfers   Min-Mod A overall   Caregiver training, cognitive remediation, dynamic balance    Mobility   min assist for transfers & gait, supervision bed mobility, max assist for stair negotiation with 1 rail  min assist overall  cognitive remediation, pt/family education, d/c planning, balance, NMR, endurance, gait   Communication             Safety/Cognition/ Behavioral Observations  Mod-Max A   Mod-Max A   initiation, orientation, attention, problem solving    Pain   Denying pain the past three days  to remain pain free  Assess for pain Q shift and PRN   Skin              Rehab Goals Patient on target to meet rehab goals: Yes *See Care Plan and progress notes for long and short-term goals.  Barriers to Discharge: pt and family ed, cognitive-behavioral rx    Possible Resolutions to Barriers:  family and pt ed. improve cognitive awareness. flap replacement    Discharge Planning/Teaching Needs:  Plan for pt to d/c home with sister and her family.  Sister to be primary caregiver and aware need for 24/7  support.  Sister continues to be very anxious about taking pt home.  Will determine if extended LOS is warranted.  Teaching this week has involved much more "hands on" with sister.   Team Discussion:  NS now to replace bone flap end of this week or early next week.  MD does not want to change out the enclosure bed until surgery done.  Upgraded diet to D2, thin.  Very focused on family training.  Sister reluctant to try stairs but will continue to address this.  Team must continue to focus on d/c home with sister as plan.  Reaching a more consistent min assist level and ambulation steadier this week.   D/c date TBD dependent on scheduling of bone flap.  Revisions to Treatment Plan:  Plan to replace bone flap   Continued Need for Acute Rehabilitation Level of Care: The patient requires daily medical management by a physician with specialized training in physical medicine and rehabilitation for the following conditions: Daily direction of a multidisciplinary physical rehabilitation program to ensure safe treatment while eliciting the highest outcome that is of practical value to the patient.: Yes Daily medical management of patient stability for increased activity during participation in an intensive rehabilitation regime.: Yes Daily analysis of laboratory values and/or radiology reports with any subsequent need for medication adjustment of medical intervention for : Post surgical problems;Neurological problems  [name] 03/18/2017, 2:16 PM

## 2017-03-18 NOTE — Progress Notes (Signed)
[place] PHYSICAL MEDICINE & REHABILITATION     PROGRESS NOTE    Subjective/Complaints: Pt in bed. Sister at bedside. No new problems. Slept well  ROS: pt denies nausea, vomiting, diarrhea, cough, shortness of breath or chest pain     Objective: Vital Signs: Blood pressure 118/65, pulse 70, temperature 97.8 F (36.6 C), temperature source Oral, resp. rate 18, height 5\' 2"  (1.575 m), weight 61.5 kg (135 lb 9.3 oz), SpO2 100 %. Ct Head Wo Contrast  Result Date: 03/17/2017 CLINICAL DATA:  Closed head injury.  Followup subdural hematoma EXAM: CT HEAD WITHOUT CONTRAST TECHNIQUE: Contiguous axial images were obtained from the base of the skull through the vertex without intravenous contrast. COMPARISON:  02/03/2017 FINDINGS: Brain: Progressive volume loss and gliosis in the previously contused right lateral frontal and lateral temporal lobes. Progressive volume loss causes sunken deformity at the level of the craniectomy. Maturing gliosis in the inferior left temporal lobe as well. No residual hemorrhage. Ventriculomegaly that is stable and attributed to the volume loss. No signs of infarct, mass, or edema. Vascular: Mild arterial calcification. Skull: Broad craniectomy flap on the right with sunken scalp. Sinuses/Orbits: Negative IMPRESSION: Interval evolution of posttraumatic gliosis and encephalomalacia in right more than left cerebrum, now with sunken scalp deformity at the craniectomy defect. Electronically Signed   By: Marnee Spring M.D.   On: 03/17/2017 16:13   No results for input(s): WBC, HGB, HCT, PLT in the last 72 hours. No results for input(s): NA, K, CL, GLUCOSE, BUN, CREATININE, CALCIUM in the last 72 hours.  Invalid input(s): CO CBG (last 3)   Recent Labs  03/17/17 1700 03/17/17 2121 03/18/17 0636  GLUCAP 96 131* 112*    Wt Readings from Last 3 Encounters:  03/17/17 61.5 kg (135 lb 9.3 oz)  02/01/17 65.6 kg (144 lb 9.6 oz)  07/31/16 68 kg (150 lb)    Physical  Exam:    Gen: NAD. Well developed.  HENT:  Craniectomy scar  Cardiovascular:  RRR Respiratory:  CTA B GI: Soft. Bowel sounds are normal. Neurological:   Spontaneously moving all 4 extremities. More attentive to left Psychiatric:   Flat but very cooperative, pleasant Skin. Warm and dry. Crani wound without drainage. Areas of scab diminishing   Assessment/Plan: 1. Cognitive and functional deficits secondary to right traumatic ICH/SDH s/p craniectomy which require 3+ hours per day of interdisciplinary therapy in a comprehensive inpatient rehab setting. Physiatrist is providing close team supervision and 24 hour management of active medical problems listed below. Physiatrist and rehab team continue to assess barriers to discharge/monitor patient progress toward functional and medical goals.  Function:  Bathing Bathing position   Position: Shower  Bathing parts Body parts bathed by patient: Right arm, Left arm, Chest, Abdomen, Right upper leg, Left upper leg, Front perineal area, Buttocks, Right lower leg, Left lower leg Body parts bathed by helper: Back  Bathing assist Assist Level: Touching or steadying assistance(Pt > 75%)      Upper Body Dressing/Undressing Upper body dressing   What is the patient wearing?: Pull over shirt/dress     Pull over shirt/dress - Perfomed by patient: Thread/unthread right sleeve, Thread/unthread left sleeve, Put head through opening, Pull shirt over trunk Pull over shirt/dress - Perfomed by helper: Pull shirt over trunk        Upper body assist Assist Level: Supervision or verbal cues   Set up : To obtain clothing/put away  Lower Body Dressing/Undressing Lower body dressing   What is the patient  wearing?: Socks, Shoes, Underwear, Pants Underwear - Performed by patient: Thread/unthread right underwear leg, Thread/unthread left underwear leg, Pull underwear up/down   Pants- Performed by patient: Thread/unthread right pants leg, Thread/unthread  left pants leg, Pull pants up/down Pants- Performed by helper: Pull pants up/down Non-skid slipper socks- Performed by patient: Don/doff left sock, Don/doff right sock Non-skid slipper socks- Performed by helper: Don/doff right sock, Don/doff left sock Socks - Performed by patient: Don/doff right sock, Don/doff left sock Socks - Performed by helper: Don/doff right sock, Don/doff left sock Shoes - Performed by patient: Don/doff right shoe, Don/doff left shoe, Fasten right, Fasten left Shoes - Performed by helper: Fasten right, Fasten left     TED Hose - Performed by patient: Don/doff right TED hose TED Hose - Performed by helper: Don/doff right TED hose, Don/doff left TED hose  Lower body assist Assist for lower body dressing: Touching or steadying assistance (Pt > 75%)      Toileting Toileting Toileting activity did not occur: Safety/medical concerns Toileting steps completed by patient: Adjust clothing prior to toileting, Performs perineal hygiene, Adjust clothing after toileting Toileting steps completed by helper: Adjust clothing after toileting Toileting Assistive Devices: Grab bar or rail  Toileting assist Assist level: Touching or steadying assistance (Pt.75%)   Transfers Chair/bed transfer Chair/bed transfer activity did not occur:  (standard chair to w/c) Chair/bed transfer method: Ambulatory Chair/bed transfer assist level: Touching or steadying assistance (Pt > 75%) Chair/bed transfer assistive device: Armrests Mechanical lift: Scientist, water quality activity did not occur: Safety/medical concerns   Max distance: 150 ft Assist level: Touching or steadying assistance (Pt > 75%)   Wheelchair Wheelchair activity did not occur: Safety/medical concerns   Max wheelchair distance: 10 Assist Level: Dependent (Pt equals 0%)  Cognition Comprehension Comprehension assist level: Understands basic 50 - 74% of the time/ requires cueing 25 - 49% of the time   Expression Expression assist level: Expresses basic 75 - 89% of the time/requires cueing 10 - 24% of the time. Needs helper to occlude trach/needs to repeat words.  Social Interaction Social Interaction assist level: Interacts appropriately 25 - 49% of time - Needs frequent redirection.  Problem Solving Problem solving assist level: Solves basic 25 - 49% of the time - needs direction more than half the time to initiate, plan or complete simple activities  Memory Memory assist level: Recognizes or recalls 25 - 49% of the time/requires cueing 50 - 75% of the time  Medical Problem List and Plan: 1. 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 DVT Prophylaxis/Anticoagulation: Subcutaneous heparin. Monitor platelet counts and any signs of bleeding. 3. Pain Management: Hycet  4. Mood: Ativan as needed 5. Neuropsych: This patient is notcapable of making decisions on hisown behalf.  -HS seroquel for sleep/agitation, reduced to 25mg --decrease further to 12 today  -limiting benzos  -continue ritalin for arousal, attention and initiation-   15mg  bid  -enclosure bed for safety still required, renewed.  Continue d/t fall risk cranial defect  -soft helmet for safety 6. Skin/Wound Care: Routine skin checks  -helmet for cranium protection  -flap site intact  -crani incision much improved---only minor scabs   -for potential cranioplasty Friday per Dr. Yetta Barre. I am fine with him coming directly back here if that is appropriate medically for the patient/staff 7. Fluids/Electrolytes/Nutrition:    -  intake reasonable 8.Seizure prophylaxis. Keppra 500 mg twice daily. Hesitate to decrease further given craniectomy 9.Pneumonia. resolved 10.Dysphagia. On D1 thins. Taking meds without issues 11.Urinary retention/recent  Escherichia coli UTI s/p course of Cipro.   - repeat urine culture with 100k coag neg staph--- macrobid completed  -  Urecholine  10mg  TID--stopped  -continue timed voids   12.Diabetes mellitus with peripheral neuropathy. Hemoglobin A1c 10.4.  Patient on Glucophage 500 mg twice a day and Glucotrol 5 mg daily at home  -lantus 15u qhs, glipizide 5 mg qd--tight control  -try to wean lantus off to simplify care---decrease to 10u and increase glipizide to 5mg  bid    13.History of hypertension. Patient on lisinopril 2.5 mg daily prior to admission. Resume if >130/85  Controlled 5/7   Vitals:   03/17/17 1515 03/18/17 0505  BP: 110/66 118/65  Pulse: 66 70  Resp: 17 18  Temp: 98.6 F (37 C) 97.8 F (36.6 C)   14.Constipation. Laxative assistance     LOS (Days) 45 A FACE TO FACE EVALUATION WAS PERFORMED  Ranelle OysterSWARTZ,Starlina Lapre T, MD 03/18/2017 9:23 AM

## 2017-03-18 NOTE — Progress Notes (Signed)
Physical Therapy Session Note  Patient Details  Name: [name] MRN: [id number] Date of Birth: [birth year]  Today's Date: 03/18/2017 PT Individual Time:1332-1430 PT time. 58 min      Short Term Goals: Week 7:  PT Short Term Goal 1 (Week 7): STG = LTG due to estimated d/c date.  Skilled Therapeutic Interventions/Progress Updates:   Pt received supine in bed and agreeable to PT. Interpreter present throughout treatment. Supine>sit transfer without assist or cues. Pt donned shoes sitting EOB with min assist from PT.  Gait in hall 268f, 1536f 25030fith min assist from PT and min cues for decreased speed and increased step length on the LLE. Car transfer with min assist from PT and min cues for proper sit>pivot technique.   Gait training over Ramp and on uneven surface x [id number] with min assist from PT an cues for safety with turn to improve step height.   2 flights of stairs with min assist ascent and descent, but moderate cues for the LUE management to support on therapist with descent. Pt demonstrated difficulty letting go and rail with LUE.   Nustep reciprocal movement training. Level 5, 6 minutes and 4 minutes with max cues for consistent reciprocal movement. And increased length of step on the RLE.   Gait training also performed in simulated home environment x [id number] as picking up multiple items off floor with min progressing to supervision assist with cues for positioning relative to object to improve safety. '  Pt returned to room and performed ambulatory transfer to bed with min assist. Sit>supine completed without assist.  Pt left supine in bed with call bell in reach and all needs met.         Therapy Documentation Precautions:  Precautions Precautions: Fall, Other (comment) Precaution Comments: bone flap removed R side of head in abdomen; hemet for OOB; Pt no longer with NG or PICC; enclosure bed Required Braces or Orthoses: Other  Brace/Splint Other Brace/Splint: helmet for OOB Restrictions Weight Bearing Restrictions: No    Pain:   0/10   See Function Navigator for Current Functional Status.   Therapy/Group: Individual Therapy  AustiLorie Phenix/2018, 1:50 PM

## 2017-03-18 NOTE — Progress Notes (Signed)
Occupational Therapy Session Note  Patient Details  Name: Jamie BeckwithMiguel Garcia Berg MRN: [id number] Date of Birth: Jan 24, 1966  Today's Date: 03/18/2017 OT Individual Time: 1120-1220 OT Individual Time Calculation (min): 60 min   Skilled Therapeutic Interventions/Progress Updates: This clinician asked patient's sister what she preferred treatment encompassed today and she stated, "Walking."    She concurred to patient/family education geared toward transfers, functional ambulation in kitchen and hallways.    Sister was able to return demonstration and able to properly cue her [name] and functional ambulation as well as ambulatoin in kitchen while retrieving items from refrigerator, high and low cabinets and following instructions.  Interpretor was present for session.   Patient was impulsive at times easy to 'reign back in' for stopping or slowing down and/or attending to the task at hand.  Patient was left in the care of his sister upon conclusion of this session.     Therapy Documentation Precautions:  Precautions Precautions: Fall, Other (comment) Precaution Comments: bone flap removed R side of head in abdomen; hemet for OOB; Pt no longer with NG or PICC; enclosure bed Required Braces or Orthoses: Other Brace/Splint Other Brace/Splint: helmet for OOB Restrictions Weight Bearing Restrictions: No  Pain:denied     See Function Navigator for Current Functional Status.   Therapy/Group: Individual Therapy  Bud Faceickett, Bonifacio Pruden [facility] Hospital[facility] 03/18/2017, 7:14 PM

## 2017-03-18 NOTE — Progress Notes (Signed)
Occupational Therapy Session Note  Patient Details  Name: Jamie BeckwithMiguel Garcia Cardenas MRN: [id number] Date of Birth: 09-Apr-1966  Today's Date: 03/18/2017 OT Individual Time: [id number] OT Individual Time Calculation (min): 55 min    Short Term Goals: Week 6:  OT Short Term Goal 1 (Week 6): STGs=LTGs secondary to estimated discharge date  Skilled Therapeutic Interventions/Progress Updates:   Upon entering the room, pt supine in vale bed with sister present in room. Physician and social worker arriving in room as well to discuss plan to return bone flap and plans for upcoming week before discharge. Pt's caregiver asking therapist several questions regarding therapeutic intervention until discharge with continued pain for family education. Questions answered until caregiver satisfied and no further questions needed. Pt requesting to shower this session. Pt ambulating with steady assistance to dresser to obtain clothing items with min verbal cues. Pt seated on TTB and removing clothing without cues to do so. Pt given increased time but needing cue to turn on water and start washing self. Pt dressed seated on EOB with steady assist for LB dressing. Pt standing at sink to brush teeth with increased time to sequence task but no further cues needed. Pt returning to bed to rest secondary to fatigue. Bed secured with all needs within reach.   Therapy Documentation Precautions:  Precautions Precautions: Fall, Other (comment) Precaution Comments: bone flap removed R side of head in abdomen; hemet for OOB; Pt no longer with NG or PICC; enclosure bed Required Braces or Orthoses: Other Brace/Splint Other Brace/Splint: helmet for OOB Restrictions Weight Bearing Restrictions: No General:   Vital Signs: Therapy Vitals Temp: 97.8 F (36.6 C) Temp Source: Oral Pulse Rate: 70 Resp: 18 BP: 118/65 Patient Position (if appropriate): Lying Oxygen Therapy SpO2: 100 % O2 Device: Not Delivered Pain:   ADL:    Vision   Perception    Praxis   Exercises:   Other Treatments:    See Function Navigator for Current Functional Status.   Therapy/Group: Individual Therapy  [name], Shellsea Borunda P 03/18/2017, 9:01 AM

## 2017-03-18 NOTE — Progress Notes (Signed)
As per Dr. Marikay Alaravid Jones of neurosurgery plan is for patient to be discharged from inpatient rehabilitation services for cranioplasty he will be admitted to the ICU per Dr. Marikay Alaravid Jones and readmitted back to inpatient rehabilitation services once medically stable

## 2017-03-19 ENCOUNTER — Inpatient Hospital Stay (HOSPITAL_COMMUNITY): Payer: Self-pay | Admitting: Certified Registered Nurse Anesthetist

## 2017-03-19 ENCOUNTER — Inpatient Hospital Stay (HOSPITAL_COMMUNITY): Payer: Self-pay

## 2017-03-19 ENCOUNTER — Inpatient Hospital Stay (HOSPITAL_COMMUNITY)
Admission: RE | Admit: 2017-03-19 | Discharge: 2017-03-22 | DRG: 983 | Disposition: A | Discharge status: Rehabilitation Facility | Payer: Self-pay | Source: Ambulatory Visit | Attending: Neurological Surgery | Admitting: Neurological Surgery

## 2017-03-19 ENCOUNTER — Encounter (HOSPITAL_COMMUNITY)
Admission: RE | Disposition: A | Discharge status: Rehabilitation Facility | Payer: Self-pay | Source: Ambulatory Visit | Attending: Neurological Surgery

## 2017-03-19 ENCOUNTER — Encounter (HOSPITAL_COMMUNITY): Payer: Self-pay | Admitting: Surgery

## 2017-03-19 DIAGNOSIS — R451 Restlessness and agitation: Secondary | ICD-10-CM

## 2017-03-19 DIAGNOSIS — R41840 Attention and concentration deficit: Secondary | ICD-10-CM

## 2017-03-19 DIAGNOSIS — S065XAA Traumatic subdural hemorrhage with loss of consciousness status unknown, initial encounter: Secondary | ICD-10-CM

## 2017-03-19 DIAGNOSIS — Z298 Encounter for other specified prophylactic measures: Secondary | ICD-10-CM

## 2017-03-19 DIAGNOSIS — Z833 Family history of diabetes mellitus: Secondary | ICD-10-CM

## 2017-03-19 DIAGNOSIS — Z9889 Other specified postprocedural states: Secondary | ICD-10-CM

## 2017-03-19 DIAGNOSIS — Z79899 Other long term (current) drug therapy: Secondary | ICD-10-CM

## 2017-03-19 DIAGNOSIS — E119 Type 2 diabetes mellitus without complications: Secondary | ICD-10-CM | POA: Diagnosis present

## 2017-03-19 DIAGNOSIS — R339 Retention of urine, unspecified: Secondary | ICD-10-CM

## 2017-03-19 DIAGNOSIS — Z428 Encounter for other plastic and reconstructive surgery following medical procedure or healed injury: Principal | ICD-10-CM

## 2017-03-19 DIAGNOSIS — R1310 Dysphagia, unspecified: Secondary | ICD-10-CM

## 2017-03-19 DIAGNOSIS — S065X9A Traumatic subdural hemorrhage with loss of consciousness of unspecified duration, initial encounter: Secondary | ICD-10-CM

## 2017-03-19 DIAGNOSIS — G8918 Other acute postprocedural pain: Secondary | ICD-10-CM

## 2017-03-19 DIAGNOSIS — Z794 Long term (current) use of insulin: Secondary | ICD-10-CM

## 2017-03-19 DIAGNOSIS — E1142 Type 2 diabetes mellitus with diabetic polyneuropathy: Secondary | ICD-10-CM

## 2017-03-19 DIAGNOSIS — R29898 Other symptoms and signs involving the musculoskeletal system: Secondary | ICD-10-CM

## 2017-03-19 HISTORY — PX: CRANIOTOMY: SHX93

## 2017-03-19 LAB — BASIC METABOLIC PANEL
ANION GAP: 9 (ref 5–15)
BUN: 13 mg/dL (ref 6–20)
CALCIUM: 8.8 mg/dL — AB (ref 8.9–10.3)
CO2: 23 mmol/L (ref 22–32)
Chloride: 107 mmol/L (ref 101–111)
Creatinine, Ser: 0.68 mg/dL (ref 0.61–1.24)
GFR calc Af Amer: >60 mL/min (ref >60)
Glucose, Bld: 112 mg/dL — ABNORMAL HIGH (ref 65–99)
Potassium: 3.9 mmol/L (ref 3.5–5.1)
Sodium: 139 mmol/L (ref 135–145)

## 2017-03-19 LAB — PROTIME-INR
INR: 0.99
Prothrombin Time: 13.1 seconds (ref 11.4–15.2)

## 2017-03-19 LAB — CBC WITH DIFFERENTIAL/PLATELET
BASOS ABS: 0 10*3/uL (ref 0.0–0.1)
BASOS PCT: 0 %
Eosinophils Absolute: 0.1 10*3/uL (ref 0.0–0.7)
Eosinophils Relative: 3 %
HEMATOCRIT: 31.7 % — AB (ref 39.0–52.0)
HEMOGLOBIN: 11.2 g/dL — AB (ref 13.0–17.0)
Lymphocytes Relative: 30 %
Lymphs Abs: 1.4 10*3/uL (ref 0.7–4.0)
MCH: 29.2 pg (ref 26.0–34.0)
MCHC: 35.3 g/dL (ref 30.0–36.0)
MCV: 82.8 fL (ref 78.0–100.0)
Monocytes Absolute: 0.3 10*3/uL (ref 0.1–1.0)
Monocytes Relative: 6 %
NEUTROS ABS: 2.9 10*3/uL (ref 1.7–7.7)
NEUTROS PCT: 61 %
Platelets: 224 10*3/uL (ref 150–400)
RBC: 3.83 MIL/uL — ABNORMAL LOW (ref 4.22–5.81)
RDW: 13.3 % (ref 11.5–15.5)
WBC: 4.7 10*3/uL (ref 4.0–10.5)

## 2017-03-19 LAB — GLUCOSE, CAPILLARY
GLUCOSE-CAPILLARY: 107 mg/dL — AB (ref 65–99)
GLUCOSE-CAPILLARY: 102 mg/dL — AB (ref 65–99)
GLUCOSE-CAPILLARY: 102 mg/dL — AB (ref 65–99)
Glucose-Capillary: 100 mg/dL — ABNORMAL HIGH (ref 65–99)

## 2017-03-19 LAB — MRSA PCR SCREENING: MRSA BY PCR: NEGATIVE

## 2017-03-19 SURGERY — CRANIOTOMY BONE FLAP/PROSTHETIC PLATE
Anesthesia: General | Site: Head | Laterality: Right

## 2017-03-19 MED ORDER — LIDOCAINE 2% (20 MG/ML) 5 ML SYRINGE
INTRAMUSCULAR | Status: AC
  Filled 2017-03-19: qty 5

## 2017-03-19 MED ORDER — LIDOCAINE-EPINEPHRINE 1 %-1:100000 IJ SOLN
INTRAMUSCULAR | Status: DC | PRN
  Administered 2017-03-19: 4 mL

## 2017-03-19 MED ORDER — HYDRALAZINE HCL 20 MG/ML IJ SOLN: 10.0000 mg | INTRAMUSCULAR | Status: DC | PRN

## 2017-03-19 MED ORDER — CEFAZOLIN SODIUM-DEXTROSE 2-4 GM/100ML-% IV SOLN
2.0000 g | INTRAVENOUS | Status: DC
  Filled 2017-03-19: qty 100

## 2017-03-19 MED ORDER — THROMBIN 5000 UNITS EX SOLR
OROMUCOSAL | Status: DC | PRN
  Administered 2017-03-19: 16:00:00 via TOPICAL

## 2017-03-19 MED ORDER — DEXAMETHASONE SODIUM PHOSPHATE 10 MG/ML IJ SOLN
INTRAMUSCULAR | Status: AC
  Filled 2017-03-19: qty 1

## 2017-03-19 MED ORDER — GLUCERNA 1.2 CAL PO LIQD
1000.0000 mL | ORAL | Status: DC
  Filled 2017-03-19 (×2): qty 1000

## 2017-03-19 MED ORDER — SODIUM CHLORIDE 0.9% FLUSH: 10.0000 mL | INTRAVENOUS | Status: DC | PRN

## 2017-03-19 MED ORDER — FENTANYL CITRATE (PF) 250 MCG/5ML IJ SOLN
INTRAMUSCULAR | Status: AC
  Filled 2017-03-19: qty 5

## 2017-03-19 MED ORDER — GUAIFENESIN 100 MG/5ML PO SOLN: 10.0000 mL | ORAL | Status: DC | PRN

## 2017-03-19 MED ORDER — PROPOFOL 10 MG/ML IV BOLUS
INTRAVENOUS | Status: AC
  Filled 2017-03-19: qty 20

## 2017-03-19 MED ORDER — CEFAZOLIN SODIUM-DEXTROSE 1-4 GM/50ML-% IV SOLN
1.0000 g | Freq: Three times a day (TID) | INTRAVENOUS | Status: AC
  Administered 2017-03-19 – 2017-03-20 (×2): 1 g via INTRAVENOUS
  Filled 2017-03-19 (×2): qty 50

## 2017-03-19 MED ORDER — DIPHENHYDRAMINE HCL 25 MG PO CAPS
25.0000 mg | ORAL_CAPSULE | Freq: Four times a day (QID) | ORAL | Status: DC
  Administered 2017-03-19 – 2017-03-22 (×10): 25 mg via ORAL
  Filled 2017-03-19 (×23): qty 1

## 2017-03-19 MED ORDER — CIPROFLOXACIN IN D5W 400 MG/200ML IV SOLN
400.0000 mg | Freq: Two times a day (BID) | INTRAVENOUS | Status: DC
  Administered 2017-03-19 – 2017-03-21 (×4): 400 mg via INTRAVENOUS
  Filled 2017-03-19 (×6): qty 200

## 2017-03-19 MED ORDER — BACITRACIN ZINC 500 UNIT/GM EX OINT
TOPICAL_OINTMENT | CUTANEOUS | Status: DC | PRN
  Administered 2017-03-19: 1 via TOPICAL

## 2017-03-19 MED ORDER — SUGAMMADEX SODIUM 500 MG/5ML IV SOLN
INTRAVENOUS | Status: AC
  Filled 2017-03-19: qty 5

## 2017-03-19 MED ORDER — FENTANYL CITRATE (PF) 100 MCG/2ML IJ SOLN
25.0000 ug | INTRAMUSCULAR | Status: DC | PRN
  Administered 2017-03-19 (×2): 50 ug via INTRAVENOUS

## 2017-03-19 MED ORDER — ROCURONIUM BROMIDE 10 MG/ML (PF) SYRINGE
PREFILLED_SYRINGE | INTRAVENOUS | Status: AC
  Filled 2017-03-19: qty 10

## 2017-03-19 MED ORDER — POTASSIUM CHLORIDE IN NACL 20-0.9 MEQ/L-% IV SOLN
INTRAVENOUS | Status: DC
  Administered 2017-03-19: 20:00:00 via INTRAVENOUS
  Filled 2017-03-19 (×2): qty 1000

## 2017-03-19 MED ORDER — SUGAMMADEX SODIUM 200 MG/2ML IV SOLN
INTRAVENOUS | Status: AC
  Filled 2017-03-19: qty 2

## 2017-03-19 MED ORDER — CHLORHEXIDINE GLUCONATE CLOTH 2 % EX PADS
6.0000 | MEDICATED_PAD | Freq: Once | CUTANEOUS | Status: AC
  Administered 2017-03-18: 6 via TOPICAL

## 2017-03-19 MED ORDER — PHENYLEPHRINE 40 MCG/ML (10ML) SYRINGE FOR IV PUSH (FOR BLOOD PRESSURE SUPPORT)
PREFILLED_SYRINGE | INTRAVENOUS | Status: DC | PRN
  Administered 2017-03-19: 80 ug via INTRAVENOUS

## 2017-03-19 MED ORDER — DEXTROSE 5 % IV SOLN
INTRAVENOUS | Status: DC | PRN
  Administered 2017-03-19: 30 ug/min via INTRAVENOUS

## 2017-03-19 MED ORDER — CEFAZOLIN SODIUM-DEXTROSE 2-3 GM-% IV SOLR
INTRAVENOUS | Status: DC | PRN
  Administered 2017-03-19: 2 g via INTRAVENOUS

## 2017-03-19 MED ORDER — HYDROCODONE-ACETAMINOPHEN 7.5-325 MG/15ML PO SOLN: 10.0000 mL | ORAL | Status: DC | PRN

## 2017-03-19 MED ORDER — ONDANSETRON HCL 4 MG/2ML IJ SOLN
INTRAMUSCULAR | Status: AC
  Filled 2017-03-19: qty 2

## 2017-03-19 MED ORDER — ONDANSETRON HCL 4 MG/2ML IJ SOLN: 4.0000 mg | INTRAMUSCULAR | Status: DC | PRN

## 2017-03-19 MED ORDER — OXYCODONE HCL 5 MG PO TABS: 5.0000 mg | ORAL_TABLET | Freq: Once | ORAL | Status: AC | PRN

## 2017-03-19 MED ORDER — BACITRACIN 50000 UNITS IM SOLR
INTRAMUSCULAR | Status: DC | PRN
  Administered 2017-03-19: 16:00:00

## 2017-03-19 MED ORDER — IPRATROPIUM-ALBUTEROL 0.5-2.5 (3) MG/3ML IN SOLN
3.0000 mL | Freq: Four times a day (QID) | RESPIRATORY_TRACT | Status: DC
  Administered 2017-03-19 – 2017-03-20 (×3): 3 mL via RESPIRATORY_TRACT
  Filled 2017-03-19 (×3): qty 3

## 2017-03-19 MED ORDER — OXYCODONE HCL 5 MG/5ML PO SOLN
ORAL | Status: AC
  Filled 2017-03-19: qty 5

## 2017-03-19 MED ORDER — INSULIN ASPART 100 UNIT/ML ~~LOC~~ SOLN
0.0000 [IU] | Freq: Three times a day (TID) | SUBCUTANEOUS | Status: DC
  Administered 2017-03-20 (×2): 2 [IU] via SUBCUTANEOUS
  Administered 2017-03-21: 3 [IU] via SUBCUTANEOUS
  Administered 2017-03-21 (×2): 2 [IU] via SUBCUTANEOUS
  Administered 2017-03-22 (×2): 3 [IU] via SUBCUTANEOUS

## 2017-03-19 MED ORDER — PRO-STAT SUGAR FREE PO LIQD
30.0000 mL | Freq: Two times a day (BID) | ORAL | Status: DC
  Filled 2017-03-19: qty 30

## 2017-03-19 MED ORDER — LABETALOL HCL 5 MG/ML IV SOLN: 10.0000 mg | INTRAVENOUS | Status: DC | PRN

## 2017-03-19 MED ORDER — CHLORHEXIDINE GLUCONATE CLOTH 2 % EX PADS
6.0000 | MEDICATED_PAD | Freq: Once | CUTANEOUS | Status: AC
  Administered 2017-03-19: 6 via TOPICAL

## 2017-03-19 MED ORDER — LEVETIRACETAM 100 MG/ML PO SOLN
750.0000 mg | Freq: Two times a day (BID) | ORAL | Status: DC
  Administered 2017-03-19 – 2017-03-22 (×6): 750 mg via ORAL
  Filled 2017-03-19: qty 10
  Filled 2017-03-19 (×2): qty 7.5
  Filled 2017-03-19: qty 10
  Filled 2017-03-19 (×4): qty 7.5

## 2017-03-19 MED ORDER — SENNA 8.6 MG PO TABS
1.0000 | ORAL_TABLET | Freq: Every day | ORAL | Status: DC
  Administered 2017-03-19 – 2017-03-20 (×2): 8.6 mg
  Filled 2017-03-19 (×2): qty 1

## 2017-03-19 MED ORDER — MIDAZOLAM HCL 2 MG/2ML IJ SOLN
INTRAMUSCULAR | Status: DC | PRN
  Administered 2017-03-19: 1 mg via INTRAVENOUS

## 2017-03-19 MED ORDER — SODIUM CHLORIDE 0.9 % IV SOLN
INTRAVENOUS | Status: DC | PRN
  Administered 2017-03-19: 15:00:00 via INTRAVENOUS

## 2017-03-19 MED ORDER — THROMBIN 20000 UNITS EX SOLR
CUTANEOUS | Status: DC | PRN
  Administered 2017-03-19: 16:00:00 via TOPICAL

## 2017-03-19 MED ORDER — ACETAMINOPHEN 160 MG/5ML PO SOLN: 650.0000 mg | ORAL | Status: DC | PRN

## 2017-03-19 MED ORDER — ADULT MULTIVITAMIN LIQUID CH
15.0000 mL | Freq: Every day | ORAL | Status: DC
  Filled 2017-03-19 (×2): qty 15

## 2017-03-19 MED ORDER — MORPHINE SULFATE (PF) 4 MG/ML IV SOLN
INTRAVENOUS | Status: AC
  Filled 2017-03-19: qty 1

## 2017-03-19 MED ORDER — OXYCODONE HCL 5 MG/5ML PO SOLN
5.0000 mg | Freq: Once | ORAL | Status: AC | PRN
  Administered 2017-03-19: 5 mg via ORAL

## 2017-03-19 MED ORDER — LIDOCAINE 2% (20 MG/ML) 5 ML SYRINGE
INTRAMUSCULAR | Status: DC | PRN
  Administered 2017-03-19: 60 mg via INTRAVENOUS

## 2017-03-19 MED ORDER — MIDAZOLAM HCL 2 MG/2ML IJ SOLN
INTRAMUSCULAR | Status: AC
  Filled 2017-03-19: qty 2

## 2017-03-19 MED ORDER — BACITRACIN ZINC 500 UNIT/GM EX OINT
TOPICAL_OINTMENT | CUTANEOUS | Status: AC
  Filled 2017-03-19: qty 28.35

## 2017-03-19 MED ORDER — BETHANECHOL CHLORIDE 10 MG PO TABS
10.0000 mg | ORAL_TABLET | Freq: Three times a day (TID) | ORAL | Status: DC
  Administered 2017-03-19 – 2017-03-20 (×3): 10 mg
  Filled 2017-03-19 (×3): qty 1

## 2017-03-19 MED ORDER — FENTANYL CITRATE (PF) 100 MCG/2ML IJ SOLN
INTRAMUSCULAR | Status: AC
  Administered 2017-03-19: 50 ug via INTRAVENOUS
  Filled 2017-03-19: qty 2

## 2017-03-19 MED ORDER — LIDOCAINE-EPINEPHRINE 1 %-1:100000 IJ SOLN
INTRAMUSCULAR | Status: AC
Start: 2017-03-19 — End: 2017-03-19
  Filled 2017-03-19: qty 1

## 2017-03-19 MED ORDER — ROCURONIUM BROMIDE 10 MG/ML (PF) SYRINGE
PREFILLED_SYRINGE | INTRAVENOUS | Status: DC | PRN
  Administered 2017-03-19: 70 mg via INTRAVENOUS

## 2017-03-19 MED ORDER — ONDANSETRON HCL 4 MG/2ML IJ SOLN
4.0000 mg | INTRAMUSCULAR | Status: DC | PRN
  Administered 2017-03-19: 4 mg via INTRAVENOUS
  Filled 2017-03-19: qty 2

## 2017-03-19 MED ORDER — PANTOPRAZOLE SODIUM 40 MG PO PACK
40.0000 mg | PACK | Freq: Every day | ORAL | Status: DC
  Administered 2017-03-19: 40 mg
  Filled 2017-03-19: qty 20

## 2017-03-19 MED ORDER — CEFAZOLIN SODIUM-DEXTROSE 2-4 GM/100ML-% IV SOLN
INTRAVENOUS | Status: AC
  Filled 2017-03-19: qty 100

## 2017-03-19 MED ORDER — 0.9 % SODIUM CHLORIDE (POUR BTL) OPTIME
TOPICAL | Status: DC | PRN
  Administered 2017-03-19 (×2): 1000 mL

## 2017-03-19 MED ORDER — FENTANYL CITRATE (PF) 250 MCG/5ML IJ SOLN
INTRAMUSCULAR | Status: DC | PRN
  Administered 2017-03-19: 100 ug via INTRAVENOUS
  Administered 2017-03-19: 50 ug via INTRAVENOUS

## 2017-03-19 MED ORDER — THROMBIN 5000 UNITS EX SOLR
CUTANEOUS | Status: AC
  Filled 2017-03-19: qty 5000

## 2017-03-19 MED ORDER — ONDANSETRON HCL 4 MG/2ML IJ SOLN
INTRAMUSCULAR | Status: DC | PRN
  Administered 2017-03-19: 4 mg via INTRAVENOUS

## 2017-03-19 MED ORDER — MORPHINE SULFATE (PF) 4 MG/ML IV SOLN
1.0000 mg | INTRAVENOUS | Status: DC | PRN
  Administered 2017-03-19 – 2017-03-20 (×4): 2 mg via INTRAVENOUS
  Filled 2017-03-19 (×3): qty 1

## 2017-03-19 MED ORDER — DOCUSATE SODIUM 50 MG/5ML PO LIQD
100.0000 mg | Freq: Every day | ORAL | Status: DC
  Administered 2017-03-19: 100 mg
  Filled 2017-03-19: qty 10

## 2017-03-19 MED ORDER — THROMBIN 20000 UNITS EX SOLR
CUTANEOUS | Status: AC
  Filled 2017-03-19: qty 20000

## 2017-03-19 MED ORDER — ONDANSETRON HCL 4 MG PO TABS: 4.0000 mg | ORAL_TABLET | ORAL | Status: DC | PRN

## 2017-03-19 MED ORDER — DEXAMETHASONE SODIUM PHOSPHATE 10 MG/ML IJ SOLN
10.0000 mg | INTRAMUSCULAR | Status: DC
  Filled 2017-03-19: qty 1

## 2017-03-19 MED ORDER — PROPOFOL 10 MG/ML IV BOLUS
INTRAVENOUS | Status: DC | PRN
  Administered 2017-03-19: 150 mg via INTRAVENOUS

## 2017-03-19 MED ORDER — SUGAMMADEX SODIUM 200 MG/2ML IV SOLN
INTRAVENOUS | Status: DC | PRN
  Administered 2017-03-19: 100 mg via INTRAVENOUS

## 2017-03-19 SURGICAL SUPPLY — 60 items
ADH SKN CLS APL DERMABOND .7 (GAUZE/BANDAGES/DRESSINGS) ×1
BAG DECANTER FOR FLEXI CONT (MISCELLANEOUS) ×3 IMPLANT
BNDG COHESIVE 4X5 TAN NS LF (GAUZE/BANDAGES/DRESSINGS) IMPLANT
BUR ACORN 6.0 PRECISION (BURR) IMPLANT
BUR ACORN 6.0MM PRECISION (BURR)
CANISTER SUCT 3000ML PPV (MISCELLANEOUS) ×6 IMPLANT
CARTRIDGE OIL MAESTRO DRILL (MISCELLANEOUS) IMPLANT
CORDS BIPOLAR (ELECTRODE) IMPLANT
DERMABOND ADVANCED (GAUZE/BANDAGES/DRESSINGS) ×2
DERMABOND ADVANCED .7 DNX12 (GAUZE/BANDAGES/DRESSINGS) ×1 IMPLANT
DIFFUSER DRILL AIR PNEUMATIC (MISCELLANEOUS) IMPLANT
DRAPE NEUROLOGICAL W/INCISE (DRAPES) ×3 IMPLANT
DRAPE SURG 17X23 STRL (DRAPES) IMPLANT
DRAPE WARM FLUID 44X44 (DRAPE) ×3 IMPLANT
DURAPREP 26ML APPLICATOR (WOUND CARE) ×3 IMPLANT
DURAPREP 6ML APPLICATOR 50/CS (WOUND CARE) ×3 IMPLANT
ELECT CAUTERY BLADE 6.4 (BLADE) IMPLANT
ELECT REM PT RETURN 9FT ADLT (ELECTROSURGICAL) ×3
ELECTRODE REM PT RTRN 9FT ADLT (ELECTROSURGICAL) ×1 IMPLANT
EVACUATOR 1/8 PVC DRAIN (DRAIN) ×3 IMPLANT
GAUZE SPONGE 4X4 12PLY STRL (GAUZE/BANDAGES/DRESSINGS) ×3 IMPLANT
GAUZE SPONGE 4X4 16PLY XRAY LF (GAUZE/BANDAGES/DRESSINGS) IMPLANT
GLOVE BIO SURGEON STRL SZ7 (GLOVE) IMPLANT
GLOVE BIO SURGEON STRL SZ8 (GLOVE) ×3 IMPLANT
GLOVE BIOGEL PI IND STRL 7.0 (GLOVE) IMPLANT
GLOVE BIOGEL PI IND STRL 8 (GLOVE) ×4 IMPLANT
GLOVE BIOGEL PI INDICATOR 7.0 (GLOVE)
GLOVE BIOGEL PI INDICATOR 8 (GLOVE) ×8
GLOVE ECLIPSE 7.5 STRL STRAW (GLOVE) ×6 IMPLANT
GOWN STRL REUS W/ TWL LRG LVL3 (GOWN DISPOSABLE) IMPLANT
GOWN STRL REUS W/ TWL XL LVL3 (GOWN DISPOSABLE) ×1 IMPLANT
GOWN STRL REUS W/TWL 2XL LVL3 (GOWN DISPOSABLE) ×6 IMPLANT
GOWN STRL REUS W/TWL LRG LVL3 (GOWN DISPOSABLE)
GOWN STRL REUS W/TWL XL LVL3 (GOWN DISPOSABLE) ×3
KIT BASIN OR (CUSTOM PROCEDURE TRAY) ×3 IMPLANT
KIT ROOM TURNOVER OR (KITS) ×3 IMPLANT
NEEDLE HYPO 25X1 1.5 SAFETY (NEEDLE) ×3 IMPLANT
NS IRRIG 1000ML POUR BTL (IV SOLUTION) ×6 IMPLANT
OIL CARTRIDGE MAESTRO DRILL (MISCELLANEOUS)
PACK CRANIOTOMY (CUSTOM PROCEDURE TRAY) ×3 IMPLANT
PACK LAMINECTOMY NEURO (CUSTOM PROCEDURE TRAY) IMPLANT
PAD ARMBOARD 7.5X6 YLW CONV (MISCELLANEOUS) ×9 IMPLANT
PIN MAYFIELD SKULL DISP (PIN) IMPLANT
PLATE 1.5  2HOLE LNG NEURO (Plate) ×2 IMPLANT
PLATE 1.5 2HOLE LNG NEURO (Plate) ×1 IMPLANT
PLATE 1.5 5HOLE SQUARE (Plate) ×9 IMPLANT
SCREW SELF DRILL HT 1.5/4MM (Screw) ×42 IMPLANT
STAPLER VISISTAT 35W (STAPLE) ×6 IMPLANT
STOCKINETTE 6  STRL (DRAPES)
STOCKINETTE 6 STRL (DRAPES) IMPLANT
SUT VIC AB 2-0 CP2 18 (SUTURE) ×9 IMPLANT
SUT VIC AB 2-0 CT2 18 VCP726D (SUTURE) IMPLANT
SUT VIC AB 3-0 SH 8-18 (SUTURE) ×3 IMPLANT
SYR CONTROL 10ML LL (SYRINGE) IMPLANT
TAPE CLOTH SURG 4X10 WHT LF (GAUZE/BANDAGES/DRESSINGS) ×3 IMPLANT
TOWEL GREEN STERILE (TOWEL DISPOSABLE) ×3 IMPLANT
TOWEL GREEN STERILE FF (TOWEL DISPOSABLE) ×2 IMPLANT
TRAY FOLEY W/METER SILVER 16FR (SET/KITS/TRAYS/PACK) IMPLANT
UNDERPAD 30X30 (UNDERPADS AND DIAPERS) IMPLANT
WATER STERILE IRR 1000ML POUR (IV SOLUTION) ×3 IMPLANT

## 2017-03-19 NOTE — Progress Notes (Signed)
Patient arrived to PACU with moderate grip to right hand and weak grip to left hand on command.  On reassessment, left hand grip is absent and patient has minimal movement of left fingers on command.  Patient's sister at bedside and states that his left hand was previously the stronger hand.  Dr. Yetta BarreJones was notified and ordered head CT on patient.  Will update MD when scan is done.

## 2017-03-19 NOTE — H&P (Signed)
Subjective: Patient is a 51 y.o. male who is admitted for cranioplasty. Onset of symptoms was a few months ago, gradually improving since that time.  Onset was related to head trauma requiring craniectomy. He has been in rehabilitation. He has made slow steady neurologic improvements. He presents today for cranioplasty.  Past Medical History:  Diagnosis Date  . Diabetes mellitus without complication [facility](HCC)     Past Surgical History:  Procedure Laterality Date  . CRANIOTOMY Right 01/13/2017   Procedure: 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, Placement of bone flap in abdomen;  Surgeon: Coletta MemosKyle Cabbell, MD;  [facility];  Service: Neurosurgery;  Laterality: Right;    Allergies  Allergen Reactions  . No Known Allergies     Social History  Substance Use Topics  . Smoking status: Never Smoker  . Smokeless tobacco: Never Used  . Alcohol use Yes     Comment: socially    Family History  Problem Relation Age of Onset  . Diabetes Mother   . Diabetes Father    Prior to Admission medications   Medication Sig Start Date End Date Taking? Authorizing Provider  acetaminophen (TYLENOL) 160 MG/5ML solution Place 20.3 mLs (650 mg total) into feeding tube every 4 (four) hours as needed for mild pain or fever. 02/01/17   Focht, Joyce CopaJessica L, PA  Amino Acids-Protein Hydrolys (FEEDING SUPPLEMENT, PRO-STAT SUGAR FREE 64,) LIQD Place 30 mLs into feeding tube 2 (two) times daily. 02/01/17   Focht, Joyce CopaJessica L, PA  bethanechol (URECHOLINE) 10 MG tablet Place 1 tablet (10 mg total) into feeding tube 3 (three) times daily. 02/01/17   Focht, Joyce CopaJessica L, PA  chlorpheniramine (CHLOR-TRIMETON) 4 MG tablet Take 1 tablet (4 mg total) by mouth every 6 (six) hours as needed (congestion, cough). 02/01/17   Focht, Joyce CopaJessica L, PA  ciprofloxacin (CIPRO) 400 MG/200ML SOLN Inject 200 mLs (400 mg total) into the vein every 12 (twelve) hours. 02/01/17   Focht, Joyce CopaJessica L, PA  docusate (COLACE) 50 MG/5ML liquid  Place 10 mLs (100 mg total) into feeding tube daily. 02/02/17   Focht, Joyce CopaJessica L, PA  guaiFENesin (ROBITUSSIN) 100 MG/5ML SOLN Place 10 mLs (200 mg total) into feeding tube every 4 (four) hours as needed for cough or to loosen phlegm. 02/01/17   Focht, Joyce CopaJessica L, PA  heparin 5000 UNIT/ML injection Inject 1 mL (5,000 Units total) into the skin every 8 (eight) hours. 02/01/17   Focht, Joyce CopaJessica L, PA  hydrALAZINE (APRESOLINE) 20 MG/ML injection Inject 0.5 mLs (10 mg total) into the vein every 2 (two) hours as needed (SBP>180). 02/01/17   Focht, Joyce CopaJessica L, PA  HYDROcodone-acetaminophen (HYCET) 7.5-325 mg/15 ml solution Place 10 mLs into feeding tube every 4 (four) hours as needed for moderate pain. 02/01/17   Focht, Joyce CopaJessica L, PA  insulin aspart (NOVOLOG) 100 UNIT/ML injection Inject 0-15 Units into the skin every 4 (four) hours. 02/01/17   Focht, Joyce CopaJessica L, PA  insulin glargine (LANTUS) 100 UNIT/ML injection Inject 0.15 mLs (15 Units total) into the skin 2 (two) times daily. 02/01/17   Focht, Jessica L, PA  ipratropium-albuterol (DUONEB) 0.5-2.5 (3) MG/3ML SOLN Take 3 mLs by nebulization every 6 (six) hours as needed. 02/01/17   Focht, Joyce CopaJessica L, PA  labetalol (NORMODYNE,TRANDATE) 5 MG/ML injection Inject 2-8 mLs (10-40 mg total) into the vein every 10 (ten) minutes as needed. 02/01/17   Focht, Joyce CopaJessica L, PA  levETIRAcetam (KEPPRA) 100 MG/ML solution Take 7.5 mLs (750 mg total) by mouth 2 (two) times  daily. 02/01/17   Focht, Joyce Copa, PA  LORazepam (ATIVAN) 2 MG/ML injection Inject 0.25 mLs (0.5 mg total) into the vein every 2 (two) hours as needed for anxiety or sedation (For procedures). 02/01/17   Focht, Joyce Copa, PA  magnesium hydroxide (MILK OF MAGNESIA) 400 MG/5ML suspension Place 30 mLs into feeding tube daily. 02/02/17   Focht, Joyce Copa, PA  mouth rinse LIQD solution 15 mLs by Mouth Rinse route 2 times daily at 12 noon and 4 pm. 02/01/17   Focht, Joyce Copa, PA  Multiple Vitamin (MULTIVITAMIN) LIQD Place 15 mLs into  feeding tube daily. 02/02/17   Focht, Joyce Copa, PA  naloxone (NARCAN) 0.4 MG/ML injection Inject 0.2 mLs (0.08 mg total) into the vein as needed (to reverse respiratory depression.  May give every 3 minutes prn. Not to exceed 0.4 mg total dose.). 02/01/17   Focht, Joyce Copa, PA  Nutritional Supplements (FEEDING SUPPLEMENT, GLUCERNA 1.2 CAL,) LIQD Place 1,000 mLs into feeding tube continuous. 02/01/17   Focht, Joyce Copa, PA  ondansetron (ZOFRAN) 4 MG/2ML SOLN injection Inject 2 mLs (4 mg total) into the vein every 4 (four) hours as needed for nausea or vomiting. 02/01/17   Focht, Joyce Copa, PA  pantoprazole sodium (PROTONIX) 40 mg/20 mL PACK Place 20 mLs (40 mg total) into feeding tube daily. 02/02/17   Focht, Joyce Copa, PA  pseudoephedrine (SUDAFED) 30 MG tablet Take 1 tablet (30 mg total) by mouth every 6 (six) hours as needed for congestion (cough). 02/01/17   Focht, Joyce Copa, PA  senna (SENOKOT) 8.6 MG TABS tablet Place 1 tablet (8.6 mg total) into feeding tube daily. 02/02/17   Focht, Jessica L, PA  sodium chloride flush (NS) 0.9 % SOLN 10-40 mLs by Intracatheter route as needed (flush). 02/01/17   Jerre Simon, PA     Review of Systems  Positive ROS: Negative  All other systems have been reviewed and were otherwise negative with the exception of those mentioned in the HPI and as above.  Objective: Vital signs in last 24 hours: Temp:  [98.5 F (36.9 C)] 98.5 F (36.9 C) (05/18 0502) Pulse Rate:  [85-107] 85 (05/18 0502) Resp:  [18] 18 (05/18 0502) BP: (101-113)/(61-70) 101/61 (05/18 0502) SpO2:  [100 %] 100 % (05/18 0502) Weight:  [60.6 kg (133 lb 9.6 oz)] 60.6 kg (133 lb 9.6 oz) (05/18 0502)  General Appearance: Alert, cooperative, no distress, appears stated age Head: Mostly healed incision with obvious craniectomy Eyes: PERRL, conjunctiva/corneas clear, EOM's intact    Ears: Normal TM's and external ear canals, both ears Throat: Lips, mucosa, and tongue normal; teeth and gums  normal Neck: Supple, symmetrical, trachea midline, no adenopathy; thyroid: No enlargement/tenderness/nodules; no carotid bruit or JVD Back: Symmetric, no curvature, ROM normal, no CVA tenderness Lungs: Clear to auscultation bilaterally, respirations unlabored Heart: Regular rate and rhythm, S1 and S2 normal, no murmur, rub or gallop Abdomen: Soft, non-tender, bowel sounds active all four quadrants, no masses, no organomegaly Extremities: Extremities normal, atraumatic, no cyanosis or edema Pulses: 2+ and symmetric all extremities Skin: Skin color, texture, turgor normal, no rashes or lesions  NEUROLOGIC:   Mental status: alert and oriented, no aphasia, good attention span, Fund of knowledge/ memory ok Motor Exam - grossly normal Sensory Exam - grossly normal Reflexes:  Coordination - grossly normal Gait - grossly normal Balance - grossly normal Cranial Nerves: I: smell Not tested  II: visual acuity  OS: na    OD: na  II: visual  fields Full to confrontation  II: pupils Equal, round, reactive to light  III,VII: ptosis None  III,IV,VI: extraocular muscles  Full ROM  V: mastication Normal  V: facial light touch sensation  Normal  V,VII: corneal reflex  Present  VII: facial muscle function - upper  Normal  VII: facial muscle function - lower Normal  VIII: hearing Not tested  IX: soft palate elevation  Normal  IX,X: gag reflex Present  XI: trapezius strength  5/5  XI: sternocleidomastoid strength 5/5  XI: neck flexion strength  5/5  XII: tongue strength  Normal    Data Review Lab Results  Component Value Date   WBC 4.7 03/19/2017   HGB 11.2 (L) 03/19/2017   HCT 31.7 (L) 03/19/2017   MCV 82.8 03/19/2017   PLT 224 03/19/2017   Lab Results  Component Value Date   NA 139 03/19/2017   K 3.9 03/19/2017   CL 107 03/19/2017   CO2 23 03/19/2017   BUN 13 03/19/2017   CREATININE 0.68 03/19/2017   GLUCOSE 112 (H) 03/19/2017   Lab Results  Component Value Date   INR 0.99  03/19/2017    Assessment/Plan: Patient admitted for cranioplasty.   [name] S 03/19/2017 2:46 PM

## 2017-03-19 NOTE — Progress Notes (Signed)
Patient ID: [name], male   DOB: 1966-03-13, 51 y.o.   MRN: [id number] Had long discussion with family today. He is awake and alert. I reviewed his CT scan. He is on the schedule today for cranioplasty. I explained to the family that I would remove the bone flap from the abdomen and replace it with plates. They understand the risks include but are not limited to bleeding, infection, brain injury, stroke, numbness, weakness, loss of vision, coma, lack of relief of symptoms, worsening symptoms, and anesthesia risk including DVT pneumonia MI and death. They agree to proceed.

## 2017-03-19 NOTE — Anesthesia Procedure Notes (Signed)
Procedure Name: Intubation Date/Time: 03/19/2017 3:37 PM Performed by: Julieta Bellini Pre-anesthesia Checklist: Patient identified, Emergency Drugs available, Suction available and Patient being monitored Patient Re-evaluated:Patient Re-evaluated prior to inductionOxygen Delivery Method: Circle system utilized Preoxygenation: Pre-oxygenation with 100% oxygen Intubation Type: IV induction Ventilation: Mask ventilation without difficulty Laryngoscope Size: Mac and 3 Grade View: Grade I Tube type: Oral Tube size: 7.5 mm Number of attempts: 1 Airway Equipment and Method: Stylet Placement Confirmation: positive ETCO2,  breath sounds checked- equal and bilateral and ETT inserted through vocal cords under direct vision Secured at: 23 cm Tube secured with: Tape Dental Injury: Teeth and Oropharynx as per pre-operative assessment

## 2017-03-19 NOTE — Progress Notes (Signed)
[facility]     PROGRESS NOTE    Subjective/Complaints: No new issues. In bed. Rested well   ROS: pt denies nausea, vomiting, diarrhea, cough, shortness of breath or chest pain      Objective: Vital Signs: Blood pressure 101/61, pulse 85, temperature 98.5 F (36.9 C), temperature source Oral, resp. rate 18, height 5\' 2"  (1.575 m), weight 60.6 kg (133 lb 9.6 oz), SpO2 100 %. Ct Head Wo Contrast  Result Date: 03/17/2017 CLINICAL DATA:  Closed head injury.  Followup subdural hematoma EXAM: CT HEAD WITHOUT CONTRAST TECHNIQUE: Contiguous axial images were obtained from the base of the skull through the vertex without intravenous contrast. COMPARISON:  02/03/2017 FINDINGS: Brain: Progressive volume loss and gliosis in the previously contused right lateral frontal and lateral temporal lobes. Progressive volume loss causes sunken deformity at the level of the craniectomy. Maturing gliosis in the inferior left temporal lobe as well. No residual hemorrhage. Ventriculomegaly that is stable and attributed to the volume loss. No signs of infarct, mass, or edema. Vascular: Mild arterial calcification. Skull: Broad craniectomy flap on the right with sunken scalp. Sinuses/Orbits: Negative IMPRESSION: Interval evolution of posttraumatic gliosis and encephalomalacia in right more than left cerebrum, now with sunken scalp deformity at the craniectomy defect. Electronically Signed   By: Marnee Spring M.D.   On: 03/17/2017 16:13    Recent Labs  03/19/17 0549  WBC 4.7  HGB 11.2*  HCT 31.7*  PLT 224    Recent Labs  03/19/17 0549  NA 139  K 3.9  CL 107  GLUCOSE 112*  BUN 13  CREATININE 0.68  CALCIUM 8.8*   CBG (last 3)   Recent Labs  03/18/17 1707 03/18/17 2108 03/19/17 0642  GLUCAP 107* 77 100*    Wt Readings from Last 3 Encounters:  03/19/17 60.6 kg (133 lb 9.6 oz)  02/01/17 65.6 kg (144 lb 9.6 oz)  07/31/16 68 kg (150 lb)    Physical Exam:     Gen: NAD. Well developed.  HENT:  Craniectomy scar  Cardiovascular:  RRR Respiratory:  CTA B GI: Soft. Bowel sounds are normal. Neurological:   Spontaneously moving all 4 extremities. More attentive to left Psychiatric:   Flat but very cooperative, pleasant Skin. Warm and dry. Crani wound without drainage. Areas of scab diminishing   Assessment/Plan: 1. Cognitive and functional deficits secondary to right traumatic ICH/SDH s/p craniectomy which require 3+ hours per day of interdisciplinary therapy in a comprehensive inpatient rehab setting. Physiatrist is providing close team supervision and 24 hour management of active medical problems listed below. Physiatrist and rehab team continue to assess barriers to discharge/monitor patient progress toward functional and medical goals.  Function:  Bathing Bathing position   Position: Shower  Bathing parts Body parts bathed by patient: Right arm, Left arm, Chest, Abdomen, Right upper leg, Left upper leg, Front perineal area, Buttocks, Right lower leg, Left lower leg Body parts bathed by helper: Back  Bathing assist Assist Level: Touching or steadying assistance(Pt > 75%)      Upper Body Dressing/Undressing Upper body dressing   What is the patient wearing?: Pull over shirt/dress     Pull over shirt/dress - Perfomed by patient: Thread/unthread right sleeve, Thread/unthread left sleeve, Put head through opening, Pull shirt over trunk Pull over shirt/dress - Perfomed by helper: Pull shirt over trunk        Upper body assist Assist Level: Supervision or verbal cues   Set up : To obtain clothing/put  away  Lower Body Dressing/Undressing Lower body dressing   What is the patient wearing?: Socks, Shoes, Underwear, Pants Underwear - Performed by patient: Thread/unthread right underwear leg, Thread/unthread left underwear leg, Pull underwear up/down   Pants- Performed by patient: Thread/unthread right pants leg, Thread/unthread left  pants leg, Pull pants up/down Pants- Performed by helper: Pull pants up/down Non-skid slipper socks- Performed by patient: Don/doff left sock, Don/doff right sock Non-skid slipper socks- Performed by helper: Don/doff right sock, Don/doff left sock Socks - Performed by patient: Don/doff right sock, Don/doff left sock Socks - Performed by helper: Don/doff right sock, Don/doff left sock Shoes - Performed by patient: Don/doff right shoe, Don/doff left shoe, Fasten right, Fasten left Shoes - Performed by helper: Fasten right, Fasten left     TED Hose - Performed by patient: Don/doff right TED hose TED Hose - Performed by helper: Don/doff right TED hose, Don/doff left TED hose  Lower body assist Assist for lower body dressing: Touching or steadying assistance (Pt > 75%)      Toileting Toileting Toileting activity did not occur: Safety/medical concerns Toileting steps completed by patient: Adjust clothing prior to toileting, Performs perineal hygiene, Adjust clothing after toileting Toileting steps completed by helper: Adjust clothing after toileting Toileting Assistive Devices: Grab bar or rail  Toileting assist Assist level: Touching or steadying assistance (Pt.75%)   Transfers Chair/bed transfer Chair/bed transfer activity did not occur:  (standard chair to w/c) Chair/bed transfer method: Ambulatory Chair/bed transfer assist level: Touching or steadying assistance (Pt > 75%) Chair/bed transfer assistive device: Armrests Mechanical lift: Scientist, water quality activity did not occur: Safety/medical concerns   Max distance: 256ft  Assist level: Touching or steadying assistance (Pt > 75%)   Wheelchair Wheelchair activity did not occur: N/A   Max wheelchair distance: 10 Assist Level: Dependent (Pt equals 0%)  Cognition Comprehension Comprehension assist level: Understands basic 50 - 74% of the time/ requires cueing 25 - 49% of the time  Expression Expression assist  level: Expresses basic 75 - 89% of the time/requires cueing 10 - 24% of the time. Needs helper to occlude trach/needs to repeat words.  Social Interaction Social Interaction assist level: Interacts appropriately 25 - 49% of time - Needs frequent redirection.  Problem Solving Problem solving assist level: Solves basic 25 - 49% of the time - needs direction more than half the time to initiate, plan or complete simple activities  Memory Memory assist level: Recognizes or recalls 25 - 49% of the time/requires cueing 50 - 75% of the time  Medical Problem List and Plan: 1. Decreased functional mobility with cognitive deficitssecondary to right IPH/SDH/temporal bone fracture after fall S/P right frontal temporoparietal craniectomy 01/14/2017 with placement of bone flap in the abdomen  Cont CIR  -RLAS V-VI  -for cranioplasty today--return to rehab Monday?  2. DVT Prophylaxis/Anticoagulation: Subcutaneous heparin. Monitor platelet counts and any signs of bleeding. 3. Pain Management: Hycet  4. Mood: Ativan as needed 5. Neuropsych: This patient is notcapable of making decisions on hisown behalf.  -HS seroquel for sleep/agitation, reduced to 25mg --decrease further to 12 today  -limiting benzos  -continue ritalin for arousal, attention and initiation-   15mg  bid  -enclosure bed for safety still required, renewed.  Continue d/t fall risk cranial defect  -soft helmet for safety 6. Skin/Wound Care: Routine skin checks  -helmet for cranium protection  -flap site intact  -crani incision much improved---only minor scabs  7. Fluids/Electrolytes/Nutrition:    -intake reasonable 8.Seizure prophylaxis. Keppra  500 mg twice daily. Hesitate to decrease further given craniectomy 9.Pneumonia. resolved 10.Dysphagia. On D1 thins. Taking meds without issues 11.Urinary retention/recent Escherichia coli UTI s/p course of Cipro.   - repeat urine culture with 100k coag neg staph--- macrobid completed  -  Urecholine  10mg  TID--stopped  -continue timed voids   12.Diabetes mellitus with peripheral neuropathy. Hemoglobin A1c 10.4.  Patient on Glucophage 500 mg twice a day and Glucotrol 5 mg daily at home  -lantus 15u qhs, glipizide 5 mg qd--tight control  -try to wean lantus off to simplify care---decrease to 10u and increase glipizide to 5mg  bid    13.History of hypertension. Patient on lisinopril 2.5 mg daily prior to admission. Resume if >130/85  Controlled 5/7   Vitals:   03/18/17 1613 03/19/17 0502  BP: 113/70 101/61  Pulse: (!) 107 85  Resp: 18 18  Temp:  98.5 F (36.9 C)   14.Constipation. Laxative assistance     LOS (Days) 46 A FACE TO FACE EVALUATION WAS PERFORMED  Ranelle OysterSWARTZ,Veronica Guerrant T, MD 03/19/2017 9:40 AM

## 2017-03-19 NOTE — Anesthesia Preprocedure Evaluation (Signed)
Anesthesia Evaluation  Patient identified by MRN, date of birth, ID band Patient confused  General Assessment Comment:Confused Preop documentation limited or incomplete due to emergent nature of procedure.  Airway Mallampati: II  TM Distance: >3 FB Neck ROM: Full    Dental  (+) Poor Dentition, Dental Advisory Given   Pulmonary     + decreased breath sounds      Cardiovascular  Rhythm:Regular Rate:Normal     Neuro/Psych    GI/Hepatic   Endo/Other  diabetes  Renal/GU      Musculoskeletal   Abdominal   Peds  Hematology   Anesthesia Other Findings   Reproductive/Obstetrics                             Anesthesia Physical Anesthesia Plan  ASA: III  Anesthesia Plan: General   Post-op Pain Management:    Induction: Intravenous  Airway Management Planned: Oral ETT  Additional Equipment: None  Intra-op Plan:   Post-operative Plan: Extubation in OR  Informed Consent: I have reviewed the patients History and Physical, chart, labs and discussed the procedure including the risks, benefits and alternatives for the proposed anesthesia with the patient or authorized representative who has indicated his/her understanding and acceptance.   Dental advisory given  Plan Discussed with: CRNA and Surgeon  Anesthesia Plan Comments:         Anesthesia Quick Evaluation

## 2017-03-19 NOTE — Transfer of Care (Signed)
Immediate Anesthesia Transfer of Care Note  Patient: [name]  Procedure(s) Performed: Procedure(s): Cranioplasty with placement of bone flap from abdomen (Right)  Patient Location: PACU  Anesthesia Type:General  Level of Consciousness: drowsy and patient cooperative  Airway & Oxygen Therapy: Patient Spontanous Breathing  Post-op Assessment: Report given to RN, Post -op Vital signs reviewed and stable and Patient moving all extremities X 4  Post vital signs: Reviewed and stable  Last Vitals:  Vitals:   03/19/17 1658  BP: (P) 120/78  Pulse: (P) 80  Resp: (P) 19  Temp: (P) 36.6 C    Last Pain:  Vitals:   03/19/17 1658  PainSc: (P) Asleep         Complications: No apparent anesthesia complications

## 2017-03-19 NOTE — Progress Notes (Signed)
Dr. Yetta BarreJones notified that CT scan was completed. He has reviewed and gave okay for patient to transfer to ICU.

## 2017-03-19 NOTE — Op Note (Signed)
03/19/2017  4:51 PM  PATIENT:  [name]  51 y.o. male  PRE-OPERATIVE DIAGNOSIS:  Cranial defect after craniectomy with placement of bone flap in the abdomen  POST-OPERATIVE DIAGNOSIS:  same  PROCEDURE:  1. Cranioplasty using original bone flap from decompressive craniectomy, which was retrieved from the abdomen through a separate incision  SURGEON:  Marikay Alaravid Kaya Pottenger, MD  ASSISTANTS: None  ANESTHESIA:   General  EBL: 100 ml  No intake/output data recorded.  BLOOD ADMINISTERED: none  DRAINS: Hemovac  SPECIMEN:  none  INDICATION FOR PROCEDURE: This patient presented with cranial defect. Imaging showed cranial defect. Patient underwent a previous decompressive craniectomy with placement of the bone flap in the abdomen. Recommended cranioplasty with original bone flap retrieved from the abdomen. Patient understood the risks, benefits, and alternatives and potential outcomes and wished to proceed.  PROCEDURE DETAILS: Patient was taken to the operating room and after induction of adequate generalized endotracheal anesthesia he was placed in the supine position on the operating room table with his head turned to the left. His head was shaved, cleaned with alcohol and then prepped with DuraPrep. The old abdominal incision was also prepped with DuraPrep after being cleaned. He was then draped in the usual sterile fashion. 5 mL of local anesthesia was injected. The abdominal incision was opened and I dissected through the subcutaneous tissues to identify the bone flap and then dissected the subcutaneous tissues away from the bone flap. We worked up under the flap and then dissected the tissues away from that also. The bone flap was removed and placed in bacitracin containing saline solution. I then irrigated this and closed the subcutaneous tissues with 2-0 Vicryl. The subcuticular tissues were closed with 3-0 Vicryl. The skin was closed with Dermabond. I opened the cranial incision. This  was a large trauma flap incision. It was opened slowly and Raney clips were used for hemostasis. Continued to reflect the skin flap forward while dissecting it away from the surface of the dura. What was left of the temporalis muscle was also reflected forward. Identified all of the bony edges. I then removed the scar tissue from the bony edges. We cleaned the cranial flap and then replaced it with doggie bone plates. It had an excellent fit. We irrigated. We placed a medium Hemovac through a separate stab incision. I then closed the galea with interrupted 2-0 Vicryl's. I closed the skin with staples. Drapes were removed. I placed an occlusive dressing. The patient was then awakened from general anesthesia and transported to the recovery room in stable condition. At the end of the procedure all sponge needle and instrument counts were correct.   PLAN OF CARE: Admit to inpatient   PATIENT DISPOSITION:  PACU - hemodynamically stable.   Delay start of Pharmacological VTE agent (>24hrs) due to surgical blood loss or risk of bleeding:  yes

## 2017-03-20 LAB — GLUCOSE, CAPILLARY
GLUCOSE-CAPILLARY: 127 mg/dL — AB (ref 65–99)
GLUCOSE-CAPILLARY: 153 mg/dL — AB (ref 65–99)
Glucose-Capillary: 138 mg/dL — ABNORMAL HIGH (ref 65–99)
Glucose-Capillary: 89 mg/dL (ref 65–99)

## 2017-03-20 MED ORDER — POTASSIUM CHLORIDE IN NACL 20-0.9 MEQ/L-% IV SOLN
INTRAVENOUS | Status: DC
  Administered 2017-03-20: 16:00:00 via INTRAVENOUS
  Filled 2017-03-20: qty 1000

## 2017-03-20 MED ORDER — GUAIFENESIN 100 MG/5ML PO SOLN: 10.0000 mL | ORAL | Status: DC | PRN

## 2017-03-20 MED ORDER — PANTOPRAZOLE SODIUM 40 MG PO TBEC
40.0000 mg | DELAYED_RELEASE_TABLET | Freq: Every day | ORAL | Status: DC
  Administered 2017-03-20 – 2017-03-22 (×3): 40 mg via ORAL
  Filled 2017-03-20 (×4): qty 1

## 2017-03-20 MED ORDER — DOCUSATE SODIUM 100 MG PO CAPS
100.0000 mg | ORAL_CAPSULE | Freq: Every day | ORAL | Status: DC
  Administered 2017-03-21: 100 mg via ORAL
  Filled 2017-03-20 (×2): qty 1

## 2017-03-20 MED ORDER — ACETAMINOPHEN 325 MG PO TABS
650.0000 mg | ORAL_TABLET | ORAL | Status: DC | PRN
  Administered 2017-03-20: 650 mg via ORAL
  Filled 2017-03-20 (×2): qty 2

## 2017-03-20 MED ORDER — SENNA 8.6 MG PO TABS
1.0000 | ORAL_TABLET | Freq: Every day | ORAL | Status: DC
  Administered 2017-03-21 – 2017-03-22 (×2): 8.6 mg via ORAL
  Filled 2017-03-20 (×3): qty 1

## 2017-03-20 MED ORDER — HYDROCODONE-ACETAMINOPHEN 7.5-325 MG/15ML PO SOLN: 10.0000 mL | ORAL | Status: DC | PRN

## 2017-03-20 MED ORDER — ADULT MULTIVITAMIN W/MINERALS CH
1.0000 | ORAL_TABLET | Freq: Every day | ORAL | Status: DC
  Administered 2017-03-21 – 2017-03-22 (×2): 1 via ORAL
  Filled 2017-03-20 (×4): qty 1

## 2017-03-20 MED ORDER — BETHANECHOL CHLORIDE 10 MG PO TABS
10.0000 mg | ORAL_TABLET | Freq: Three times a day (TID) | ORAL | Status: DC
  Administered 2017-03-20 – 2017-03-22 (×5): 10 mg via ORAL
  Filled 2017-03-20 (×6): qty 1

## 2017-03-20 NOTE — Progress Notes (Signed)
Patient ID: Jamie BeckwithMiguel Garcia Berg, male   DOB: [birth year], 51 y.o.   MRN: [id number] Subjective: Patient reports some soreness  Objective: Vital signs in last 24 hours: Temp:  [97.2 F (36.2 C)-98.1 F (36.7 C)] 97.8 F (36.6 C) (05/19 0757) Pulse Rate:  [72-95] 80 (05/19 0800) Resp:  [10-20] 12 (05/19 0800) BP: (85-122)/(62-91) 99/70 (05/19 0800) SpO2:  [88 %-100 %] 97 % (05/19 0800)  Intake/Output from previous day: 05/18 0701 - 05/19 0700 In: 1840 [I.V.:1590; IV Piggyback:250] Out: 815 [Urine:650; Drains:135; Blood:30] Intake/Output this shift: Total I/O In: 400 [I.V.:150; IV Piggyback:250] Out: 600 [Urine:600]  Neurologic: Grossly normal, incisions good, some perseveration  Lab Results: Lab Results  Component Value Date   WBC 4.7 03/19/2017   HGB 11.2 (L) 03/19/2017   HCT 31.7 (L) 03/19/2017   MCV 82.8 03/19/2017   PLT 224 03/19/2017   Lab Results  Component Value Date   INR 0.99 03/19/2017   BMET Lab Results  Component Value Date   NA 139 03/19/2017   K 3.9 03/19/2017   CL 107 03/19/2017   CO2 23 03/19/2017   GLUCOSE 112 (H) 03/19/2017   BUN 13 03/19/2017   CREATININE 0.68 03/19/2017   CALCIUM 8.8 (L) 03/19/2017    Studies/Results: Ct Head Wo Contrast  Result Date: 03/19/2017 CLINICAL DATA:  Onset of left arm weakness today. EXAM: CT HEAD WITHOUT CONTRAST TECHNIQUE: Contiguous axial images were obtained from the base of the skull through the vertex without intravenous contrast. COMPARISON:  CT exams from 03/17/2017 and 02/03/2017 FINDINGS: Brain: Chronic right frontal and temporal lobe areas of encephalomalacia and gliosis. No new intracranial hemorrhage, midline shift or edema. No large vascular territory infarction is identified. No extra-axial fluid collections are noted. Vascular: Carotid siphon calcifications are stable. No hyperdense vessels. Skull: The right frontal and parietal skull flap have been restored into place. Postop changes of the  overlying scalp. No unaccountable skull fracture nor bone destruction is seen. Sinuses/Orbits: No acute finding. Other: None. IMPRESSION: 1. 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 No acute intracranial abnormality. 2. The right frontal and parietal skull flaps have been restored. Electronically Signed   By: Tollie Ethavid  Kwon M.D.   On: 03/19/2017 18:19    Assessment/Plan: Doing well, to floor, ready for rehab when bed available   LOS: 1 day    Shelia Magallon S 03/20/2017, 9:20 AM

## 2017-03-20 NOTE — Evaluation (Addendum)
Physical Therapy Evaluation Patient Details Name: [name] MRN: [id number] DOB: 08-02-1966 Today's Date: 03/20/2017   History of Present Illness  51 yo with PMHx of DM was found down on 3/11 after a fall on stairs the evening before.  Pt sustained R Frontal Intraparenchymal Hemorrhage, R SDH, and SAH with midline shift, non-displaced L Temporal Bone fx and L Mastoid fx. With VDRF s/p craniotomy with flap. Pt D/C to CIR and [facility] with replacement of skull flap.   Clinical Impression  Pt pleasant with use of video interpreter throughout session. Pt unaware of day and following commands but easily distracted with difficulty problem solving room number for direction. Pt continues to exhibit deficits with balance, strength, cognition and mobility who would benefit from acute therapy as well as return to CIR to increase independence and decrease burden of care.  VSS throughout    Follow Up Recommendations CIR;Supervision/Assistance - 24 hour    Equipment Recommendations  None recommended by PT    Recommendations for Other Services       Precautions / Restrictions Precautions Precautions: Fall      Mobility  Bed Mobility Overal bed mobility: Needs Assistance Bed Mobility: Supine to Sit;Sit to Supine     Supine to sit: Min guard Sit to supine: Min guard   General bed mobility comments: cues for direction and guarding for lines and safety  Transfers Overall transfer level: Needs assistance   Transfers: Sit to/from Stand Sit to Stand: Min assist         General transfer comment: cues for hand placement and redirection to task as pt distracted by monitor lines  Ambulation/Gait Ambulation/Gait assistance: Min assist Ambulation Distance (Feet): 200 Feet Assistive device: None Gait Pattern/deviations: Shuffle;Wide base of support   Gait velocity interpretation: Below normal speed for age/gender General Gait Details: pt with  wide BOS, shuffling gait with increased sway and cues for stride and direction. Pt unable to recall room number and required multiple cues to follow numbers and find room  Stairs            Wheelchair Mobility    Modified Rankin (Stroke Patients Only)       Balance Overall balance assessment: Needs assistance   Sitting balance-Leahy Scale: Good       Standing balance-Leahy Scale: Good                               Pertinent Vitals/Pain Pain Assessment: No/denies pain    Home Living Family/patient expects to be discharged to:: Private residence Living Arrangements: Spouse/significant other;Other relatives Available Help at Discharge: Family;Available 24 hours/day Type of Home: House Home Access: Level entry     Home Layout: Two level;Able to live on main level with bedroom/bathroom Home Equipment: None Additional Comments: sister reports she will be primary caregiver at discharge . pt was living alone in an apartment and caring for himself prior to fall    Prior Function                 Hand Dominance   Dominant Hand: Right    Extremity/Trunk Assessment   Upper Extremity Assessment Upper Extremity Assessment: Defer to OT evaluation    Lower Extremity Assessment Lower Extremity Assessment: Generalized weakness    Cervical / Trunk Assessment Cervical / Trunk Exceptions: tendency for neck flexion  Communication   Communication: Prefers language other than [place];Interpreter utilized  Continental AirlinesCognition Arousal/Alertness:  Awake/alert Behavior During Therapy: Flat affect Overall Cognitive Status: Impaired/Different from baseline Area of Impairment: Attention;Following commands;Orientation                 Orientation Level: Disoriented to;Time;Situation Current Attention Level: Sustained   Following Commands: Follows one step commands consistently     Problem Solving: Slow processing;Requires verbal cues;Requires tactile  cues General Comments: pt with continued difficulty with attending to task, easily distracted by monitor lines, unable to recall room number and needs redirection to follow numerical cues to find room      General Comments      Exercises     Assessment/Plan    PT Assessment Patient needs continued PT services  PT Problem List Decreased mobility;Decreased safety awareness;Decreased balance;Decreased activity tolerance;Decreased cognition       PT Treatment Interventions DME instruction;Gait training;Stair training;Functional mobility training;Neuromuscular re-education;Balance training;Therapeutic exercise;Therapeutic activities;Patient/family education;Cognitive remediation    PT Goals (Current goals can be found in the Care Plan section)  Acute Rehab PT Goals Patient Stated Goal: Per sister for pt to be able to return to her home. PT Goal Formulation: With patient/family Time For Goal Achievement: 04/03/17 Potential to Achieve Goals: Fair    Frequency Min 3X/week   Barriers to discharge        Co-evaluation               AM-PAC PT "6 Clicks" Daily Activity  Outcome Measure Difficulty turning over in bed (including adjusting bedclothes, sheets and blankets)?: None Difficulty moving from lying on back to sitting on the side of the bed? : A Little Difficulty sitting down on and standing up from a chair with arms (e.g., wheelchair, bedside commode, etc,.)?: A Little Help needed moving to and from a bed to chair (including a wheelchair)?: A Little Help needed walking in hospital room?: A Little Help needed climbing 3-5 steps with a railing? : A Lot 6 Click Score: 18    End of Session Equipment Utilized During Treatment: Gait belt Activity Tolerance: Patient tolerated treatment well Patient left: in bed;with call bell/phone within reach;with family/visitor present;with bed alarm set Nurse Communication: Mobility status PT Visit Diagnosis: Unsteadiness on feet  (R26.81);Other abnormalities of gait and mobility (R26.89)    Time: 1610-9604 PT Time Calculation (min) (ACUTE ONLY): 23 min   Charges:   PT Evaluation $PT Eval Moderate Complexity: 1 Procedure     PT G Codes:        Delaney Meigs, PT [phone]   Audryana Hockenberry B Delmon Andrada 03/20/2017, 1:51 PM

## 2017-03-20 NOTE — Progress Notes (Signed)
PT Cancellation Note  Patient Details Name: [name] MRN: [id number] DOB: [birth year]   Cancelled Treatment:    Reason Eval/Treat Not Completed: Medical issues which prohibited therapy (pt currently on bedrest and await increased activity order)   [name] 03/20/2017, 7:17 AM  [name], PT [phone]

## 2017-03-20 NOTE — Progress Notes (Signed)
Patient unable to void at 6 hours.  Patient bladder scanned, showing greater than .  Patient assisted to use urinal to try to urinate.  Patient only able to pass a few drops.  42F foley catheter inserted without difficulty.  Patient sitting up in bed with no complaints of pain or discomfort.  Evening meal set up, feeding self at this time.  Call light within reach.

## 2017-03-20 NOTE — Progress Notes (Signed)
Patient arrived to unit.  Alert, verbally pleasant.  No complaints of pain or discomfort. Staples to scalp intact with some drainage.  No dressing when brought up to unit.  Family at bedside.

## 2017-03-20 NOTE — Anesthesia Postprocedure Evaluation (Addendum)
Anesthesia Post Note  Patient: [name]  Procedure(s) Performed: Procedure(s) (LRB): Cranioplasty with placement of bone flap from abdomen (Right)  Patient location during evaluation: PACU Anesthesia Type: General Level of consciousness: awake and patient cooperative Pain management: pain level controlled Vital Signs Assessment: post-procedure vital signs reviewed and stable Respiratory status: spontaneous breathing, nonlabored ventilation, respiratory function stable and patient connected to nasal cannula oxygen Cardiovascular status: stable Postop Assessment: no signs of nausea or vomiting Anesthetic complications: no       Last Vitals:  Vitals:   03/20/17 1100 03/20/17 1140  BP: 94/68   Pulse: 86   Resp: 14   Temp:  36.9 C    Last Pain:  Vitals:   03/20/17 1140  TempSrc: Oral  PainSc:                  [name]

## 2017-03-21 LAB — GLUCOSE, CAPILLARY
GLUCOSE-CAPILLARY: 161 mg/dL — AB (ref 65–99)
GLUCOSE-CAPILLARY: 166 mg/dL — AB (ref 65–99)
Glucose-Capillary: 137 mg/dL — ABNORMAL HIGH (ref 65–99)
Glucose-Capillary: 146 mg/dL — ABNORMAL HIGH (ref 65–99)

## 2017-03-21 MED ORDER — CIPROFLOXACIN HCL 500 MG PO TABS
500.0000 mg | ORAL_TABLET | Freq: Two times a day (BID) | ORAL | Status: DC
  Administered 2017-03-21: 500 mg via ORAL
  Filled 2017-03-21 (×2): qty 1

## 2017-03-21 NOTE — Progress Notes (Signed)
Patient ID: [name], male   DOB: [birth year], 51 y.o.   MRN: [id number] Doing well, maex4, confused at baseline, incision looks good, awaiting rehab

## 2017-03-21 NOTE — Evaluation (Signed)
Occupational Therapy Evaluation Patient Details Name: [name] MRN: [id number] DOB: Oct 22, 1966 Today's Date: 03/21/2017    History of Present Illness 51 yo with PMHx of DM was found down on 3/11 after a fall on stairs the evening before.  Pt sustained R Frontal Intraparenchymal Hemorrhage, R SDH, and SAH with midline shift, non-displaced L Temporal Bone fx and L Mastoid fx. With VDRF s/p craniotomy with flap. Pt D/C to CIR and returned to [facility] on 5/18 for cranioplasty with replacement of skull flap.    Clinical Impression   Pt presents to OT with the problem list below. Pt very pleasant this session with use of video interpreter throughout session. Pt required multimodal cues throughout session for attention and sequencing for ADL tasks as well as for safety during functional transfers. Pt showed deficits in problem solving. Pt will require skilled OT in the acute setting and will require CIR level therapy to maximize safety and independence in ADL and functional transfers, as well as decrease burden of care. Next session to focus on attention during ADL tasks in standing.    Follow Up Recommendations  CIR;Supervision/Assistance - 24 hour    Equipment Recommendations  Other (comment) (defer to next venue)    Recommendations for Other Services Rehab consult     Precautions / Restrictions Precautions Precautions: Fall Restrictions Weight Bearing Restrictions: No      Mobility Bed Mobility Overal bed mobility: Needs Assistance Bed Mobility: Supine to Sit;Sit to Supine     Supine to sit: Min guard Sit to supine: Min guard   General bed mobility comments: cues for direction, pt able to perform without physical assist  Transfers Overall transfer level: Needs assistance Equipment used: 1 person hand held assist Transfers: Sit to/from Stand Sit to Stand: Min assist         General transfer comment: vc for safe hand placement    Balance Overall balance  assessment: Needs assistance Sitting-balance support: Feet supported;No upper extremity supported Sitting balance-Leahy Scale: Good Sitting balance - Comments: able to maintain sitting EOB for grooming tasks and to don/doff socks   Standing balance support: Single extremity supported;During functional activity Standing balance-Leahy Scale: Fair                             ADL either performed or assessed with clinical judgement   ADL Overall ADL's : Needs assistance/impaired     Grooming: Wash/dry face;Oral care;Minimal assistance;Sitting Grooming Details (indicate cue type and reason): verbal and tactile cues to complete tasks, re-direction for attention (Pt distracted by video interpreter) Upper Body Bathing: Minimal assistance   Lower Body Bathing: Minimal assistance   Upper Body Dressing : Minimal assistance   Lower Body Dressing: Minimal assistance;Cueing for sequencing;Sitting/lateral leans Lower Body Dressing Details (indicate cue type and reason): able to don/doff socks, with re-direction to task Toilet Transfer: Moderate assistance;Ambulation   Toileting- Clothing Manipulation and Hygiene: Minimal assistance   Tub/ Shower Transfer: Moderate assistance   Functional mobility during ADLs: Moderate assistance;Cueing for safety (1 HHA) General ADL Comments: Unsure if Pt has trouble following directions due to language difference or cognitive abilities     Vision         Perception     Praxis      Pertinent Vitals/Pain Pain Assessment: Faces Faces Pain Scale: Hurts little more Pain Location: staples - Pt required vc not to "pick" at them Pain Descriptors / Indicators: Grimacing Pain Intervention(s): Monitored during  session     Hand Dominance Right   Extremity/Trunk Assessment Upper Extremity Assessment Upper Extremity Assessment: RUE deficits/detail;LUE deficits/detail RUE Deficits / Details: strength grossly 4/5 RUE Coordination: decreased fine  motor;decreased gross motor LUE Deficits / Details: strength grossly 3+/5 LUE Coordination: decreased fine motor;decreased gross motor   Lower Extremity Assessment Lower Extremity Assessment: Defer to PT evaluation   Cervical / Trunk Assessment Cervical / Trunk Assessment: Other exceptions Cervical / Trunk Exceptions: tendency for neck flexion   Communication Communication Communication: Prefers language other than [place];Interpreter utilized Sales promotion account executive used for session)   Cognition Arousal/Alertness: Awake/alert Behavior During Therapy: Flat affect Overall Cognitive Status: Impaired/Different from baseline Area of Impairment: Attention;Orientation;Problem solving                   Current Attention Level: Sustained   Following Commands: Follows one step commands inconsistently     Problem Solving: Slow processing;Requires verbal cues;Requires tactile cues General Comments: Pt with difficulty attending, and problem solving how to open containers during grooming   General Comments       Exercises     Shoulder Instructions      Home Living Family/patient expects to be discharged to:: Private residence Living Arrangements: Spouse/significant other;Other relatives Available Help at Discharge: Family;Available 24 hours/day Type of Home: House Home Access: Level entry     Home Layout: Two level;Able to live on main level with bedroom/bathroom     Bathroom Shower/Tub: Chief Strategy Officer: Standard     Home Equipment: None   Additional Comments: Home information gleaned from PT eval  Lives With: Family    Prior Functioning/Environment Level of Independence: Independent        Comments: According to chart review, before original accident pt lived with roommate, works as Administrator, and drives.        OT Problem List: Decreased strength;Decreased range of motion;Decreased activity tolerance;Impaired balance (sitting and/or  standing);Decreased coordination;Decreased cognition;Decreased safety awareness;Decreased knowledge of use of DME or AE;Decreased knowledge of precautions;Impaired UE functional use      OT Treatment/Interventions: Self-care/ADL training;Neuromuscular education;DME and/or AE instruction;Manual therapy;Therapeutic activities;Cognitive remediation/compensation;Patient/family education;Balance training    OT Goals(Current goals can be found in the care plan section) Acute Rehab OT Goals Patient Stated Goal: goal not stated ADL Goals Pt Will Perform Eating: with supervision;sitting Pt Will Perform Grooming: with supervision;standing Pt Will Perform Upper Body Bathing: with supervision;sitting Pt Will Perform Lower Body Bathing: with supervision;sit to/from stand Pt Will Transfer to Toilet: with supervision;ambulating;regular height toilet Pt Will Perform Toileting - Clothing Manipulation and hygiene: with supervision;sit to/from stand  OT Frequency: Min 3X/week   Barriers to D/C:            Co-evaluation              AM-PAC PT "6 Clicks" Daily Activity     Outcome Measure Help from another person eating meals?: A Little Help from another person taking care of personal grooming?: A Little Help from another person toileting, which includes using toliet, bedpan, or urinal?: A Little Help from another person bathing (including washing, rinsing, drying)?: A Little Help from another person to put on and taking off regular upper body clothing?: A Little Help from another person to put on and taking off regular lower body clothing?: A Little 6 Click Score: 18   End of Session Equipment Utilized During Treatment: Gait belt;Other (comment) (video interpreter) Nurse Communication: Mobility status  Activity Tolerance: Patient tolerated treatment well Patient left:  in bed;with call bell/phone within reach;with bed alarm set  OT Visit Diagnosis: Unsteadiness on feet (R26.81);Other symptoms  and signs involving cognitive function                Time: [id number] OT Time Calculation (min): 27 min Charges:  OT General Charges $OT Visit: 1 Procedure OT Evaluation $OT Eval Moderate Complexity: 1 Procedure OT Treatments $Self Care/Home Management : 8-22 mins G-Codes:     Sherryl MangesLaura Odesser Tourangeau OTR/L [phone]   Evern BioLaura J Davari Lopes 03/21/2017, 5:37 PM

## 2017-03-22 ENCOUNTER — Encounter (HOSPITAL_COMMUNITY): Payer: Self-pay | Admitting: Neurological Surgery

## 2017-03-22 ENCOUNTER — Inpatient Hospital Stay (HOSPITAL_COMMUNITY): Payer: Self-pay | Admitting: Occupational Therapy

## 2017-03-22 ENCOUNTER — Inpatient Hospital Stay (HOSPITAL_COMMUNITY): Payer: Self-pay

## 2017-03-22 ENCOUNTER — Inpatient Hospital Stay (HOSPITAL_COMMUNITY)
Admission: RE | Admit: 2017-03-22 | Discharge: 2017-03-27 | DRG: 949 | Disposition: A | Discharge status: Home Health | Payer: Self-pay | Source: Intra-hospital | Attending: Physical Medicine & Rehabilitation | Admitting: Physical Medicine & Rehabilitation

## 2017-03-22 DIAGNOSIS — Z79899 Other long term (current) drug therapy: Secondary | ICD-10-CM

## 2017-03-22 DIAGNOSIS — Z833 Family history of diabetes mellitus: Secondary | ICD-10-CM

## 2017-03-22 DIAGNOSIS — Z9889 Other specified postprocedural states: Secondary | ICD-10-CM

## 2017-03-22 DIAGNOSIS — R1310 Dysphagia, unspecified: Secondary | ICD-10-CM | POA: Diagnosis present

## 2017-03-22 DIAGNOSIS — R1312 Dysphagia, oropharyngeal phase: Secondary | ICD-10-CM | POA: Diagnosis present

## 2017-03-22 DIAGNOSIS — S065X9A Traumatic subdural hemorrhage with loss of consciousness of unspecified duration, initial encounter: Secondary | ICD-10-CM

## 2017-03-22 DIAGNOSIS — R451 Restlessness and agitation: Secondary | ICD-10-CM

## 2017-03-22 DIAGNOSIS — N390 Urinary tract infection, site not specified: Secondary | ICD-10-CM | POA: Diagnosis present

## 2017-03-22 DIAGNOSIS — E1142 Type 2 diabetes mellitus with diabetic polyneuropathy: Secondary | ICD-10-CM

## 2017-03-22 DIAGNOSIS — R339 Retention of urine, unspecified: Secondary | ICD-10-CM | POA: Diagnosis present

## 2017-03-22 DIAGNOSIS — Z298 Encounter for other specified prophylactic measures: Secondary | ICD-10-CM

## 2017-03-22 DIAGNOSIS — G8918 Other acute postprocedural pain: Secondary | ICD-10-CM

## 2017-03-22 DIAGNOSIS — R41840 Attention and concentration deficit: Secondary | ICD-10-CM

## 2017-03-22 DIAGNOSIS — S065XAA Traumatic subdural hemorrhage with loss of consciousness status unknown, initial encounter: Secondary | ICD-10-CM | POA: Diagnosis present

## 2017-03-22 DIAGNOSIS — S062X3S Diffuse traumatic brain injury with loss of consciousness of 1 hour to 5 hours 59 minutes, sequela: Secondary | ICD-10-CM

## 2017-03-22 DIAGNOSIS — W19XXXD Unspecified fall, subsequent encounter: Secondary | ICD-10-CM

## 2017-03-22 DIAGNOSIS — S06369D Traumatic hemorrhage of cerebrum, unspecified, with loss of consciousness of unspecified duration, subsequent encounter: Principal | ICD-10-CM

## 2017-03-22 DIAGNOSIS — IMO0002 Reserved for concepts with insufficient information to code with codable children: Secondary | ICD-10-CM | POA: Diagnosis present

## 2017-03-22 DIAGNOSIS — R509 Fever, unspecified: Secondary | ICD-10-CM

## 2017-03-22 DIAGNOSIS — Z2989 Encounter for other specified prophylactic measures: Secondary | ICD-10-CM

## 2017-03-22 DIAGNOSIS — B9620 Unspecified Escherichia coli [E. coli] as the cause of diseases classified elsewhere: Secondary | ICD-10-CM | POA: Diagnosis present

## 2017-03-22 DIAGNOSIS — E1165 Type 2 diabetes mellitus with hyperglycemia: Secondary | ICD-10-CM | POA: Diagnosis present

## 2017-03-22 DIAGNOSIS — I10 Essential (primary) hypertension: Secondary | ICD-10-CM | POA: Diagnosis present

## 2017-03-22 DIAGNOSIS — S0219XD Other fracture of base of skull, subsequent encounter for fracture with routine healing: Secondary | ICD-10-CM

## 2017-03-22 DIAGNOSIS — I6200 Nontraumatic subdural hemorrhage, unspecified: Secondary | ICD-10-CM

## 2017-03-22 DIAGNOSIS — Z794 Long term (current) use of insulin: Secondary | ICD-10-CM

## 2017-03-22 LAB — GLUCOSE, CAPILLARY
GLUCOSE-CAPILLARY: 184 mg/dL — AB (ref 65–99)
GLUCOSE-CAPILLARY: 158 mg/dL — AB (ref 65–99)
GLUCOSE-CAPILLARY: 123 mg/dL — AB (ref 65–99)
Glucose-Capillary: 160 mg/dL — ABNORMAL HIGH (ref 65–99)

## 2017-03-22 LAB — URINALYSIS, ROUTINE W REFLEX MICROSCOPIC
BILIRUBIN URINE: NEGATIVE
GLUCOSE, UA: NEGATIVE mg/dL
Ketones, ur: NEGATIVE mg/dL
Leukocytes, UA: NEGATIVE
Nitrite: NEGATIVE
PH: 6 (ref 5.0–8.0)
Protein, ur: 30 mg/dL — AB
SPECIFIC GRAVITY, URINE: 1.018 (ref 1.005–1.030)

## 2017-03-22 MED ORDER — DOCUSATE SODIUM 100 MG PO CAPS
100.0000 mg | ORAL_CAPSULE | Freq: Every day | ORAL | Status: DC
  Administered 2017-03-22 – 2017-03-26 (×5): 100 mg via ORAL
  Filled 2017-03-22 (×5): qty 1

## 2017-03-22 MED ORDER — INSULIN GLARGINE 100 UNIT/ML ~~LOC~~ SOLN
10.0000 [IU] | Freq: Every day | SUBCUTANEOUS | Status: DC
  Administered 2017-03-22: 10 [IU] via SUBCUTANEOUS
  Filled 2017-03-22: qty 0.1

## 2017-03-22 MED ORDER — ADULT MULTIVITAMIN W/MINERALS CH
1.0000 | ORAL_TABLET | Freq: Every day | ORAL | Status: DC
  Administered 2017-03-23 – 2017-03-27 (×5): 1 via ORAL
  Filled 2017-03-22 (×5): qty 1

## 2017-03-22 MED ORDER — QUETIAPINE FUMARATE 25 MG PO TABS
12.5000 mg | ORAL_TABLET | Freq: Every day | ORAL | Status: DC
  Administered 2017-03-22 – 2017-03-23 (×2): 12.5 mg via ORAL
  Filled 2017-03-22 (×2): qty 1

## 2017-03-22 MED ORDER — GLIPIZIDE 5 MG PO TABS
5.0000 mg | ORAL_TABLET | Freq: Two times a day (BID) | ORAL | Status: DC
  Administered 2017-03-22 – 2017-03-27 (×10): 5 mg via ORAL
  Filled 2017-03-22 (×10): qty 1

## 2017-03-22 MED ORDER — SENNA 8.6 MG PO TABS
1.0000 | ORAL_TABLET | Freq: Every day | ORAL | Status: DC
  Administered 2017-03-23 – 2017-03-26 (×4): 8.6 mg via ORAL
  Filled 2017-03-22 (×5): qty 1

## 2017-03-22 MED ORDER — ONDANSETRON HCL 4 MG PO TABS: 4.0000 mg | ORAL_TABLET | Freq: Four times a day (QID) | ORAL | Status: DC | PRN

## 2017-03-22 MED ORDER — INSULIN ASPART 100 UNIT/ML ~~LOC~~ SOLN
0.0000 [IU] | Freq: Three times a day (TID) | SUBCUTANEOUS | Status: DC
  Administered 2017-03-23: 3 [IU] via SUBCUTANEOUS
  Administered 2017-03-23 – 2017-03-26 (×2): 2 [IU] via SUBCUTANEOUS

## 2017-03-22 MED ORDER — ACETAMINOPHEN 325 MG PO TABS: 650.0000 mg | ORAL_TABLET | ORAL | Status: DC | PRN

## 2017-03-22 MED ORDER — ONDANSETRON HCL 4 MG/2ML IJ SOLN
4.0000 mg | Freq: Four times a day (QID) | INTRAMUSCULAR | Status: DC | PRN
Start: 2017-03-22 — End: 2017-03-27

## 2017-03-22 MED ORDER — LEVETIRACETAM 100 MG/ML PO SOLN
750.0000 mg | Freq: Two times a day (BID) | ORAL | Status: DC
  Administered 2017-03-22 – 2017-03-25 (×6): 750 mg via ORAL
  Filled 2017-03-22 (×7): qty 10

## 2017-03-22 MED ORDER — HYDROCODONE-ACETAMINOPHEN 7.5-325 MG/15ML PO SOLN
10.0000 mL | ORAL | Status: DC | PRN
  Administered 2017-03-22: 10 mL via ORAL
  Filled 2017-03-22: qty 15

## 2017-03-22 MED ORDER — BETHANECHOL CHLORIDE 10 MG PO TABS
10.0000 mg | ORAL_TABLET | Freq: Three times a day (TID) | ORAL | Status: DC
  Administered 2017-03-22 – 2017-03-26 (×11): 10 mg via ORAL
  Filled 2017-03-22 (×11): qty 1

## 2017-03-22 MED ORDER — CIPROFLOXACIN HCL 500 MG PO TABS
500.0000 mg | ORAL_TABLET | Freq: Two times a day (BID) | ORAL | Status: DC
  Administered 2017-03-22 – 2017-03-24 (×4): 500 mg via ORAL
  Filled 2017-03-22 (×4): qty 1

## 2017-03-22 MED ORDER — PANTOPRAZOLE SODIUM 40 MG PO TBEC
40.0000 mg | DELAYED_RELEASE_TABLET | Freq: Every day | ORAL | Status: DC
Start: 2017-03-23 — End: 2017-03-24
  Administered 2017-03-23 – 2017-03-24 (×2): 40 mg via ORAL
  Filled 2017-03-22 (×2): qty 1

## 2017-03-22 MED ORDER — METHYLPHENIDATE HCL 5 MG PO TABS
10.0000 mg | ORAL_TABLET | Freq: Two times a day (BID) | ORAL | Status: DC
  Administered 2017-03-23 – 2017-03-24 (×3): 10 mg via ORAL
  Filled 2017-03-22 (×4): qty 2

## 2017-03-22 MED ORDER — SORBITOL 70 % SOLN: 30.0000 mL | Freq: Every day | Status: DC | PRN

## 2017-03-22 MED FILL — Thrombin For Soln 20000 Unit: CUTANEOUS | Qty: 1 | Status: CN

## 2017-03-22 MED FILL — Thrombin For Soln 5000 Unit: CUTANEOUS | Qty: 5000 | Status: CN

## 2017-03-22 NOTE — Progress Notes (Signed)
Physical Therapy Treatment Patient Details Name: [name] MRN: [id number] DOB: [birth year] Today's Date: 03/22/2017    History of Present Illness 51 yo with PMHx of DM was found down on 3/11 after a fall on stairs the evening before.  Pt sustained R Frontal Intraparenchymal Hemorrhage, R SDH, and SAH with midline shift, non-displaced L Temporal Bone fx and L Mastoid fx. With VDRF s/p craniotomy with flap. Pt D/C to CIR and [facility] 5/18 for cranioplasty with replacement of skull flap.     PT Comments    Progressing with mobility but continues to require assist for safety, stability and cognition. Current POC remains appropriate. Will continue to see as indicated and progress as tolerated.   Follow Up Recommendations  CIR;Supervision/Assistance - 24 hour     Equipment Recommendations  None recommended by PT    Recommendations for Other Services       Precautions / Restrictions Precautions Precautions: Fall Restrictions Weight Bearing Restrictions: No    Mobility  Bed Mobility Overal bed mobility: Needs Assistance Bed Mobility: Supine to Sit;Sit to Supine     Supine to sit: Min guard Sit to supine: Min guard   General bed mobility comments: cues for direction, pt able to perform without physical assist  Transfers Overall transfer level: Needs assistance Equipment used: 1 person hand held assist Transfers: Sit to/from Stand Sit to Stand: Min assist         General transfer comment: vc for safe hand placement  Ambulation/Gait Ambulation/Gait assistance: Min assist   Assistive device: 1 person hand held assist Gait Pattern/deviations: Shuffle;Wide base of support   Gait velocity interpretation: Below normal speed for age/gender General Gait Details: patient with decreased insight and awareness during ambulation, easily distracted causing multiple LOB. Worse in enclosed areas. Min assist throughout activity. Attempted higher level balance  but patient unable to maintain stability   Stairs            Wheelchair Mobility    Modified Rankin (Stroke Patients Only)       Balance Overall balance assessment: Needs assistance Sitting-balance support: Feet supported;No upper extremity supported Sitting balance-Leahy Scale: Good Sitting balance - Comments: able to maintain sitting EOB for grooming tasks and to don/doff socks   Standing balance support: Single extremity supported;During functional activity Standing balance-Leahy Scale: Fair               High level balance activites: Direction changes;Turns;Head turns High Level Balance Comments: poor stability, assist required to prevent LOB            Cognition Arousal/Alertness: Awake/alert Behavior During Therapy: Flat affect Overall Cognitive Status: Impaired/Different from baseline Area of Impairment: Attention;Orientation;Problem solving                   Current Attention Level: Sustained   Following Commands: Follows one step commands inconsistently     Problem Solving: Slow processing;Requires verbal cues;Requires tactile cues General Comments: continues to demonstrate impuslivity and poor awareness. Patient disoriented, believes he is in [place] despite multiple reorientation attempts      Exercises      General Comments        Pertinent Vitals/Pain Pain Assessment: Faces Faces Pain Scale: Hurts a little bit Pain Location: head Pain Descriptors / Indicators: Grimacing Pain Intervention(s): Monitored during session    Home Living                      Prior Function  PT Goals (current goals can now be found in the care plan section) Acute Rehab PT Goals Patient Stated Goal: goal not stated PT Goal Formulation: With patient/family Time For Goal Achievement: 04/03/17 Potential to Achieve Goals: Fair Progress towards PT goals: Progressing toward goals    Frequency    Min 3X/week      PT Plan  Current plan remains appropriate    Co-evaluation              AM-PAC PT "6 Clicks" Daily Activity  Outcome Measure  Difficulty turning over in bed (including adjusting bedclothes, sheets and blankets)?: None Difficulty moving from lying on back to sitting on the side of the bed? : A Little Difficulty sitting down on and standing up from a chair with arms (e.g., wheelchair, bedside commode, etc,.)?: A Little Help needed moving to and from a bed to chair (including a wheelchair)?: A Little Help needed walking in hospital room?: A Little Help needed climbing 3-5 steps with a railing? : A Lot 6 Click Score: 18    End of Session Equipment Utilized During Treatment: Gait belt Activity Tolerance: Patient tolerated treatment well Patient left: in bed;with call bell/phone within reach;with family/visitor present;with bed alarm set Nurse Communication: Mobility status PT Visit Diagnosis: Unsteadiness on feet (R26.81);Other abnormalities of gait and mobility (R26.89)     Time: 0943-1000 PT Time Calculation (min) (ACUTE ONLY): 17 min  Charges:  $Gait Training: 8-22 mins                    G Codes:       [name], PT DPT  [phone]    [name] 03/22/2017, 11:48 AM

## 2017-03-22 NOTE — Progress Notes (Signed)
Rehab admissions - I spoke with Dr. Ronnald Ramp and he has cleared patient for re admit to acute inpatient rehab.  I met with patient and his sister with an interpreter at the bedside.  Patient does not want to eat or take medications and he says he feels bad.  He is saying he does not want inpatient rehab.  But, sister is saying yes to inpatient rehab.  I will plan to readmit to acute inpatient rehab today.  Call me for questions.  #[phone]

## 2017-03-22 NOTE — Care Management Note (Signed)
Case Management Note  Patient Details  Name: Jamie BeckwithMiguel Garcia Berg MRN: [id number] Date of Birth: 24-Jun-1966  Subjective/Objective:                    Action/Plan: Pt discharging to CIR today. No further needs per CM.   Expected Discharge Date:  03/22/17               Expected Discharge Plan:  IP Rehab Facility  In-House Referral:     Discharge planning Services     Post Acute Care Choice:    Choice offered to:     DME Arranged:    DME Agency:     HH Arranged:    HH Agency:     Status of Service:  Completed, signed off  If discussed at [employer] Meetings, dates discussed:    Additional Comments:  Kermit BaloKelli F Tc Kapusta, RN 03/22/2017, 12:08 PM

## 2017-03-22 NOTE — Progress Notes (Signed)
Patient  Refused  meds

## 2017-03-22 NOTE — Plan of Care (Signed)
Problem: Safety: Goal: Ability to remain free from injury will improve Outcome: Progressing Patient encouraged to use call light for staff assistance. Patient is forgetful and noncompliant with calling staff. Bed in lowest position. Bed alarm on. Will continue to monitor.

## 2017-03-22 NOTE — H&P (Signed)
Physical Medicine and Rehabilitation Admission H&P    Chief complaint: Headache  HPI: [name] a 51 y.o.right handedlimited English-speaking malewith history of hypertension,diabetes mellitus. Per chart review patient lives with sister was independent prior to admission.Plan is for sister and brother-in-law to provide assistance as needed at time of discharge.Presented 01/10/2017 fter being found down from a supposed fall. CT of the head and imaging revealed intracranial hemorrhage with a 4.5 cm right frontal intraparenchymal hemorrhage, 6 mm right subdural hematoma and other scattered areas of intraparenchymal and subarachnoid hemorrhage with right to left midline shift. Nondisplaced vertical fracture through the left temporal bone.CT cervical spine negative.CT abdomen and pelvis with no evidence of acute injury.UDS negative.neurosurgery Dr. Marikay Alar advised conservative care with ICP monitor.follow-up ENT Dr. Jenne Pane for temporal bone fracture no surgical intervention required.follow-up CT imaging showed increased right to left shift greater than 1 cm. Basal cisterns slightly effaced.Patient Withsteady rise in his ICP and not responsive to heavy sedation. .Underwent right frontal temporal parietal craniectomy for hematoma evacuation 01/14/2017 per Dr. Mikal Plane with placement of bone flap in the abdomen. Keppra for seizure prophylaxis.Patient was extubated 01/19/2017.Nasogastric tube in place for nutritional support patient remains NPO.Later placed on subcutaneous heparin for DVT prophylaxis. Placed on Ancef 01/18/2017 for suspected pneumonia 5 days.Bouts of urinary retention with Urecholine addedas well as greater than 100,000 Escherichia coli UTI placed on Cipro 01/28/2017. Physical therapy evaluation completed with recommendations of physical medicine rehabilitation consult.patient was admitted for a comprehensive rehabilitation program 02/01/2017. Patient had been maintained  on Ritalin as well as Seroquel for mood stimulation and improve awareness. His voiding continued to improve and Urecholine ongoing 10 mg 3 times a day. Blood sugars monitored with hemoglobin A1c of 10.4 and Lantus insulin adjusted. He was ambulating up to 200 feet with max verbal cues for sequencing. He was able to communicate his needs with noted language barrier. Neurosurgery was consulted for plan cranioplasty and was discharged acute care services 03/19/2017 for cranioplasty using original bone flap from decompressive craniectomy which was retrieved from the abdomen per Dr. Marikay Alar. Therapies have been resumed and patient is admitted back to inpatient rehabilitation services to continue ongoing comprehensive inpatient rehabilitation services to complete education and plan discharge.  Review of Systems  Unable to perform ROS: Acuity of condition   Past Medical History:  Diagnosis Date  . Diabetes mellitus without complication [facility])    Past Surgical History:  Procedure Laterality Date  . CRANIOTOMY Right 01/13/2017   Procedure: 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, Placement of bone flap in abdomen;  Surgeon: Coletta Memos, MD;  Location: [facility] OR;  Service: Neurosurgery;  Laterality: Right;   Family History  Problem Relation Age of Onset  . Diabetes Mother   . Diabetes Father    Social History:  reports that he has never smoked. He has never used smokeless tobacco. He reports that he drinks alcohol. He reports that he does not use drugs. Allergies:  Allergies  Allergen Reactions  . No Known Allergies    Medications Prior to Admission  Medication Sig Dispense Refill  . acetaminophen (TYLENOL) 160 MG/5ML solution Place 20.3 mLs (650 mg total) into feeding tube every 4 (four) hours as needed for mild pain or fever. 120 mL 0  . Amino Acids-Protein Hydrolys (FEEDING SUPPLEMENT, PRO-STAT SUGAR FREE 64,) LIQD Place 30 mLs into feeding tube 2 (two) times daily. 900  mL 0  . bethanechol (URECHOLINE) 10 MG tablet Place 1 tablet (10 mg  total) into feeding tube 3 (three) times daily.    . chlorpheniramine (CHLOR-TRIMETON) 4 MG tablet Take 1 tablet (4 mg total) by mouth every 6 (six) hours as needed (congestion, cough). 14 tablet 0  . ciprofloxacin (CIPRO) 400 MG/200ML SOLN Inject 200 mLs (400 mg total) into the vein every 12 (twelve) hours. 2000 mL   . docusate (COLACE) 50 MG/5ML liquid Place 10 mLs (100 mg total) into feeding tube daily. 100 mL 0  . guaiFENesin (ROBITUSSIN) 100 MG/5ML SOLN Place 10 mLs (200 mg total) into feeding tube every 4 (four) hours as needed for cough or to loosen phlegm. 1200 mL 0  . heparin 5000 UNIT/ML injection Inject 1 mL (5,000 Units total) into the skin every 8 (eight) hours. 1 mL   . hydrALAZINE (APRESOLINE) 20 MG/ML injection Inject 0.5 mLs (10 mg total) into the vein every 2 (two) hours as needed (SBP>180). 1 mL   . HYDROcodone-acetaminophen (HYCET) 7.5-325 mg/15 ml solution Place 10 mLs into feeding tube every 4 (four) hours as needed for moderate pain. 120 mL 0  . insulin aspart (NOVOLOG) 100 UNIT/ML injection Inject 0-15 Units into the skin every 4 (four) hours. 10 mL 11  . insulin glargine (LANTUS) 100 UNIT/ML injection Inject 0.15 mLs (15 Units total) into the skin 2 (two) times daily. 10 mL 11  . ipratropium-albuterol (DUONEB) 0.5-2.5 (3) MG/3ML SOLN Take 3 mLs by nebulization every 6 (six) hours as needed. 360 mL   . labetalol (NORMODYNE,TRANDATE) 5 MG/ML injection Inject 2-8 mLs (10-40 mg total) into the vein every 10 (ten) minutes as needed. 20 mL   . levETIRAcetam (KEPPRA) 100 MG/ML solution Take 7.5 mLs (750 mg total) by mouth 2 (two) times daily. 473 mL 12  . LORazepam (ATIVAN) 2 MG/ML injection Inject 0.25 mLs (0.5 mg total) into the vein every 2 (two) hours as needed for anxiety or sedation (For procedures). 1 mL 0  . magnesium hydroxide (MILK OF MAGNESIA) 400 MG/5ML suspension Place 30 mLs into feeding tube daily. 360  mL 0  . mouth rinse LIQD solution 15 mLs by Mouth Rinse route 2 times daily at 12 noon and 4 pm.  0  . Multiple Vitamin (MULTIVITAMIN) LIQD Place 15 mLs into feeding tube daily.    . naloxone (NARCAN) 0.4 MG/ML injection Inject 0.2 mLs (0.08 mg total) into the vein as needed (to reverse respiratory depression.  May give every 3 minutes prn. Not to exceed 0.4 mg total dose.). 1 mL   . Nutritional Supplements (FEEDING SUPPLEMENT, GLUCERNA 1.2 CAL,) LIQD Place 1,000 mLs into feeding tube continuous.    . ondansetron (ZOFRAN) 4 MG/2ML SOLN injection Inject 2 mLs (4 mg total) into the vein every 4 (four) hours as needed for nausea or vomiting. 2 mL 0  . pantoprazole sodium (PROTONIX) 40 mg/20 mL PACK Place 20 mLs (40 mg total) into feeding tube daily. 30 each   . pseudoephedrine (SUDAFED) 30 MG tablet Take 1 tablet (30 mg total) by mouth every 6 (six) hours as needed for congestion (cough). 30 tablet 0  . senna (SENOKOT) 8.6 MG TABS tablet Place 1 tablet (8.6 mg total) into feeding tube daily. 120 each 0  . sodium chloride flush (NS) 0.9 % SOLN 10-40 mLs by Intracatheter route as needed (flush).      Home: Home Living Family/patient expects to be discharged to:: Private residence Living Arrangements: Spouse/significant other, Other relatives Available Help at Discharge: Family, Available 24 hours/day Type of Home: House Home Access: Level  entry Home Layout: Two level, Able to live on main level with bedroom/bathroom Bathroom Shower/Tub: Engineer, manufacturing systems: Standard Home Equipment: None Additional Comments: Home information gleaned from PT eval  Lives With: Family   Functional History: Prior Function Level of Independence: Independent Comments: According to chart review, before original accident pt lived with roommate, works as Administrator, and drives.  Functional Status:  Mobility: Bed Mobility Overal bed mobility: Needs Assistance Bed Mobility: Supine to Sit, Sit to  Supine Supine to sit: Min guard Sit to supine: Min guard General bed mobility comments: cues for direction, pt able to perform without physical assist Transfers Overall transfer level: Needs assistance Equipment used: 1 person hand held assist Transfers: Sit to/from Stand Sit to Stand: Min assist General transfer comment: vc for safe hand placement Ambulation/Gait Ambulation/Gait assistance: Min assist Ambulation Distance (Feet): 200 Feet Assistive device: None Gait Pattern/deviations: Shuffle, Wide base of support General Gait Details: pt with wide BOS, shuffling gait with increased sway and cues for stride and direction. Pt unable to recall room number and required multiple cues to follow numbers and find room Gait velocity interpretation: Below normal speed for age/gender    ADL: ADL Overall ADL's : Needs assistance/impaired Grooming: Wash/dry face, Oral care, Minimal assistance, Sitting Grooming Details (indicate cue type and reason): verbal and tactile cues to complete tasks, re-direction for attention (Pt distracted by video interpreter) Upper Body Bathing: Minimal assistance Lower Body Bathing: Minimal assistance Upper Body Dressing : Minimal assistance Lower Body Dressing: Minimal assistance, Cueing for sequencing, Sitting/lateral leans Lower Body Dressing Details (indicate cue type and reason): able to don/doff socks, with re-direction to task Toilet Transfer: Moderate assistance, Ambulation Toileting- Clothing Manipulation and Hygiene: Minimal assistance Tub/ Shower Transfer: Moderate assistance Functional mobility during ADLs: Moderate assistance, Cueing for safety (1 HHA) General ADL Comments: Unsure if Pt has trouble following directions due to language difference or cognitive abilities  Cognition: Cognition Overall Cognitive Status: Impaired/Different from baseline Orientation Level: Oriented to person, Oriented to place, Disoriented to time, Disoriented to  situation Cognition Arousal/Alertness: Awake/alert Behavior During Therapy: Flat affect Overall Cognitive Status: Impaired/Different from baseline Area of Impairment: Attention, Orientation, Problem solving Orientation Level: Disoriented to, Time, Situation Current Attention Level: Sustained Following Commands: Follows one step commands inconsistently Problem Solving: Slow processing, Requires verbal cues, Requires tactile cues General Comments: Pt with difficulty attending, and problem solving how to open containers during grooming  Physical Exam: Blood pressure 115/71, pulse 91, temperature 97.9 F (36.6 C), temperature source Oral, resp. rate 18, SpO2 98 %. Physical Exam  Vitals reviewed. Constitutional: He appears well-developed and well-nourished.  HENT:  Cranioplasty site clean and dry with staples intact  Eyes: EOM are normal. Right eye exhibits no discharge. Left eye exhibits no discharge.  Pupils round and reactive to light  Neck: Normal range of motion. Neck supple. No thyromegaly present.  Cardiovascular: Normal rate, regular rhythm and normal heart sounds.   Respiratory: Effort normal and breath sounds normal.  GI: Soft. Bowel sounds are normal. He exhibits no distension.  Musculoskeletal: He exhibits no edema or tenderness.  Neurological: He is alert.  Mtoor: 4+/5 throughout, ?except for left ankel 3/5 A&Ox1  Skin: Skin is warm and dry.  Psychiatric:  Unable to assess due to mentation    Results for orders placed or performed during the hospital encounter of 03/19/17 (from the past 48 hour(s))  Glucose, capillary     Status: Abnormal   Collection Time: 03/20/17  7:55 AM  Result Value  Ref Range   Glucose-Capillary 127 (H) 65 - 99 mg/dL  Glucose, capillary     Status: Abnormal   Collection Time: 03/20/17 11:40 AM  Result Value Ref Range   Glucose-Capillary 138 (H) 65 - 99 mg/dL  Glucose, capillary     Status: None   Collection Time: 03/20/17  4:38 PM  Result  Value Ref Range   Glucose-Capillary 89 65 - 99 mg/dL  Glucose, capillary     Status: Abnormal   Collection Time: 03/21/17  6:40 AM  Result Value Ref Range   Glucose-Capillary 161 (H) 65 - 99 mg/dL   Comment 1 Notify RN    Comment 2 Document in Chart   Glucose, capillary     Status: Abnormal   Collection Time: 03/21/17 11:40 AM  Result Value Ref Range   Glucose-Capillary 166 (H) 65 - 99 mg/dL  Glucose, capillary     Status: Abnormal   Collection Time: 03/21/17  4:35 PM  Result Value Ref Range   Glucose-Capillary 137 (H) 65 - 99 mg/dL  Glucose, capillary     Status: Abnormal   Collection Time: 03/21/17  9:03 PM  Result Value Ref Range   Glucose-Capillary 146 (H) 65 - 99 mg/dL  Glucose, capillary     Status: Abnormal   Collection Time: 03/22/17  6:16 AM  Result Value Ref Range   Glucose-Capillary 160 (H) 65 - 99 mg/dL   Comment 1 Notify RN    Comment 2 Document in Chart    No results found.     Medical Problem List and Plan: 1.  Decreased functional mobility secondary to right intraparenchymal hemorrhage-SDH-temporal bone fracture after fall. Status post right frontotemporoparietal craniectomy 01/14/2017 with cranioplasty 03/19/2017 2.  DVT Prophylaxis/Anticoagulation: SCDs. Patient is ambulatory 3. Pain Management: Hycet when necessary 4. Mood: Resume Ritalin 10 mg twice a day, Seroquel 12.5 mg daily at bedtime 5. Neuropsych: This patient is capable of making decisions on not own behalf. 6. Skin/Wound Care: Routine skin checks 7. Fluids/Electrolytes/Nutrition: Routine I&O with follow-up chemistries 8. Seizure prophylaxis. Keppra 750 mg twice a day. 9. Dysphagia. Dysphagia #2 FEN liquids. Follow-up speech therapy 10. Urinary retention/Escherichia coli UTI. Urecholine 10 mg 3 times a day. Check PVRs. Complete course of Cipro 11. Diabetes mellitus peripheral neuropathy. Hemoglobin A1c 10.4. Glucotrol 5 mg twice a day, Lantus insulin 10 units daily at bedtime. Wean insulin to  simplify care. Consider resuming Glucophage 500 mg twice daily as prior to admission 12. Hypertension. Patient on lisinopril 2.5 mg daily prior to admission. Resume as needed  Post Admission Physician Evaluation: 1. Preadmission assessment reviewed and changes made below. 2. Functional deficits secondary  to right SDH. 3. Patient is admitted to receive collaborative, interdisciplinary care between the physiatrist, rehab nursing staff, and therapy team. 4. Patient's level of medical complexity and substantial therapy needs in context of that medical necessity cannot be provided at a lesser intensity of care such as a SNF. 5. Patient has experienced substantial functional loss from his/her baseline which was documented above under the "Functional History" and "Functional Status" headings.  Judging by the patient's diagnosis, physical exam, and functional history, the patient has potential for functional progress which will result in measurable gains while on inpatient rehab.  These gains will be of substantial and practical use upon discharge  in facilitating mobility and self-care at the household level. 6. Physiatrist will provide 24 hour management of medical needs as well as oversight of the therapy plan/treatment and provide guidance as appropriate regarding  the interaction of the two. 7. 24 hour rehab nursing will assist with bladder management, bowel management, safety, skin/wound care, disease management, medication administration, pain management and patient education  and help integrate therapy concepts, techniques,education, etc. 8. PT will assess and treat for/with: Lower extremity strength, range of motion, stamina, balance, functional mobility, safety, adaptive techniques and equipment, woundcare, coping skills, pain control, education.   Goals are: Supervision. 9. OT will assess and treat for/with: ADL's, functional mobility, safety, upper extremity strength, adaptive techniques and  equipment, wound mgt, ego support, and community reintegration.   Goals are: Supervision. Therapy may not proceed with showering this patient. 10. SLP will assess and treat for/with: cognition, swallowing.  Goals are: Min A, superivsion. 11. Case Management and Social Worker will assess and treat for psychological issues and discharge planning. 12. Team conference will be held weekly to assess progress toward goals and to determine barriers to discharge. 13. Patient will receive at least 3 hours of therapy per day at least 5 days per week. 14. ELOS: 8-12 days. 15. Prognosis:  good     Maryla Morrow, MD, [place] [facility] Charlton Amor., PA-C 03/22/2017

## 2017-03-22 NOTE — Discharge Summary (Signed)
Physician Discharge Summary  Patient ID: [name] MRN: [id number] DOB/AGE: June 20, 1966 51 y.o.  Admit date: 03/19/2017 Discharge date: 03/22/2017  Admission Diagnoses:  craniectomy    Discharge Diagnoses:  same   Discharged Condition: stable  Hospital Course: The patient was admitted on 03/19/2017 and taken to the operating room where the patient underwent  cranioplasty. The patient tolerated the procedure well and was taken to the recovery room and then to the  ICU in stable condition. The hospital course was routine. There were no complications other than urinary retention requiring Foley catheter placement. The wound remained clean dry and intact. Pt had appropriate  head soreness. No complaints of  new N/T/W. The patient remained afebrile with stable vital signs, and tolerated a regular diet. The patient continued to increase activities, and pain was well controlled with oral pain medications.   Consults: rehabilitation medicine  Significant Diagnostic Studies:  Results for orders placed or performed during the hospital encounter of 03/19/17  Glucose, capillary  Result Value Ref Range   Glucose-Capillary 102 (H) 65 - 99 mg/dL  Glucose, capillary  Result Value Ref Range   Glucose-Capillary 102 (H) 65 - 99 mg/dL   Comment 1 Notify RN    Comment 2 Document in Chart   Glucose, capillary  Result Value Ref Range   Glucose-Capillary 153 (H) 65 - 99 mg/dL  Glucose, capillary  Result Value Ref Range   Glucose-Capillary 127 (H) 65 - 99 mg/dL  Glucose, capillary  Result Value Ref Range   Glucose-Capillary 138 (H) 65 - 99 mg/dL  Glucose, capillary  Result Value Ref Range   Glucose-Capillary 89 65 - 99 mg/dL  Glucose, capillary  Result Value Ref Range   Glucose-Capillary 161 (H) 65 - 99 mg/dL   Comment 1 Notify RN    Comment 2 Document in Chart   Glucose, capillary  Result Value Ref Range   Glucose-Capillary 166 (H) 65 - 99 mg/dL  Glucose, capillary  Result Value  Ref Range   Glucose-Capillary 137 (H) 65 - 99 mg/dL  Glucose, capillary  Result Value Ref Range   Glucose-Capillary 146 (H) 65 - 99 mg/dL  Glucose, capillary  Result Value Ref Range   Glucose-Capillary 160 (H) 65 - 99 mg/dL   Comment 1 Notify RN    Comment 2 Document in Chart     Ct Head Wo Contrast  Result Date: 03/19/2017 CLINICAL DATA:  Onset of left arm weakness today. EXAM: CT HEAD WITHOUT CONTRAST TECHNIQUE: Contiguous axial images were obtained from the base of the skull through the vertex without intravenous contrast. COMPARISON:  CT exams from 03/17/2017 and 02/03/2017 FINDINGS: Brain: Chronic right frontal and temporal lobe areas of encephalomalacia and gliosis. No new intracranial hemorrhage, midline shift or edema. No large vascular territory infarction is identified. No extra-axial fluid collections are noted. Vascular: Carotid siphon calcifications are stable. No hyperdense vessels. Skull: The right frontal and parietal skull flap have been restored into place. Postop changes of the overlying scalp. No unaccountable skull fracture nor bone destruction is seen. Sinuses/Orbits: No acute finding. Other: None. IMPRESSION: 1. 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 No acute intracranial abnormality. 2. The right frontal and parietal skull flaps have been restored. Electronically Signed   By: Tollie Eth M.D.   On: 03/19/2017 18:19   Ct Head Wo Contrast  Result Date: 03/17/2017 CLINICAL DATA:  Closed head injury.  Followup subdural hematoma EXAM: CT HEAD WITHOUT CONTRAST TECHNIQUE: Contiguous axial images were obtained from the  base of the skull through the vertex without intravenous contrast. COMPARISON:  02/03/2017 FINDINGS: Brain: Progressive volume loss and gliosis in the previously contused right lateral frontal and lateral temporal lobes. Progressive volume loss causes sunken deformity at the level of the craniectomy. Maturing gliosis in the inferior left  temporal lobe as well. No residual hemorrhage. Ventriculomegaly that is stable and attributed to the volume loss. No signs of infarct, mass, or edema. Vascular: Mild arterial calcification. Skull: Broad craniectomy flap on the right with sunken scalp. Sinuses/Orbits: Negative IMPRESSION: Interval evolution of posttraumatic gliosis and encephalomalacia in right more than left cerebrum, now with sunken scalp deformity at the craniectomy defect. Electronically Signed   By: Marnee SpringJonathon  Watts M.D.   On: 03/17/2017 16:13    Antibiotics:  Anti-infectives    Start     Dose/Rate Route Frequency Ordered Stop   03/21/17 2000  ciprofloxacin (CIPRO) tablet 500 mg     500 mg Oral 2 times daily 03/21/17 1216     03/19/17 2300  ceFAZolin (ANCEF) IVPB 1 g/50 mL premix     1 g 100 mL/hr over 30 Minutes Intravenous Every 8 hours 03/19/17 1908 03/20/17 0830   03/19/17 2000  ciprofloxacin (CIPRO) IVPB 400 mg  Status:  Discontinued     400 mg 200 mL/hr over 60 Minutes Intravenous Every 12 hours 03/19/17 1908 03/21/17 1216   03/19/17 1604  bacitracin 50,000 Units in sodium chloride irrigation 0.9 % 500 mL irrigation  Status:  Discontinued       As needed 03/19/17 1604 03/19/17 1655   03/19/17 1402  ceFAZolin (ANCEF) 2-4 GM/100ML-% IVPB    Comments:  Schonewitz, Leigh   : cabinet override      03/19/17 1402 03/20/17 0214      Discharge Exam: Blood pressure 115/71, pulse 91, temperature 97.9 F (36.6 C), temperature source Oral, resp. rate 18, SpO2 98 %. Neurologic: Grossly normal  incision clean dry and intact  Discharge Medications:   Allergies as of 03/22/2017      Reactions   No Known Allergies       Medication List    TAKE these medications   acetaminophen 160 MG/5ML solution Commonly known as:  TYLENOL Place 20.3 mLs (650 mg total) into feeding tube every 4 (four) hours as needed for mild pain or fever.   bethanechol 10 MG tablet Commonly known as:  URECHOLINE Place 1 tablet (10 mg total) into  feeding tube 3 (three) times daily.   chlorpheniramine 4 MG tablet Commonly known as:  CHLOR-TRIMETON Take 1 tablet (4 mg total) by mouth every 6 (six) hours as needed (congestion, cough).   ciprofloxacin 400 MG/200ML Soln Commonly known as:  CIPRO Inject 200 mLs (400 mg total) into the vein every 12 (twelve) hours.   docusate 50 MG/5ML liquid Commonly known as:  COLACE Place 10 mLs (100 mg total) into feeding tube daily.   feeding supplement (GLUCERNA 1.2 CAL) Liqd Place 1,000 mLs into feeding tube continuous.   feeding supplement (PRO-STAT SUGAR FREE 64) Liqd Place 30 mLs into feeding tube 2 (two) times daily.   guaiFENesin 100 MG/5ML Soln Commonly known as:  ROBITUSSIN Place 10 mLs (200 mg total) into feeding tube every 4 (four) hours as needed for cough or to loosen phlegm.   heparin 5000 UNIT/ML injection Inject 1 mL (5,000 Units total) into the skin every 8 (eight) hours.   hydrALAZINE 20 MG/ML injection Commonly known as:  APRESOLINE Inject 0.5 mLs (10 mg total) into the vein  every 2 (two) hours as needed (SBP>180).   HYDROcodone-acetaminophen 7.5-325 mg/15 ml solution Commonly known as:  HYCET Place 10 mLs into feeding tube every 4 (four) hours as needed for moderate pain.   insulin aspart 100 UNIT/ML injection Commonly known as:  novoLOG Inject 0-15 Units into the skin every 4 (four) hours.   insulin glargine 100 UNIT/ML injection Commonly known as:  LANTUS Inject 0.15 mLs (15 Units total) into the skin 2 (two) times daily.   ipratropium-albuterol 0.5-2.5 (3) MG/3ML Soln Commonly known as:  DUONEB Take 3 mLs by nebulization every 6 (six) hours as needed.   labetalol 5 MG/ML injection Commonly known as:  NORMODYNE,TRANDATE Inject 2-8 mLs (10-40 mg total) into the vein every 10 (ten) minutes as needed.   levETIRAcetam 100 MG/ML solution Commonly known as:  KEPPRA Take 7.5 mLs (750 mg total) by mouth 2 (two) times daily.   LORazepam 2 MG/ML  injection Commonly known as:  ATIVAN Inject 0.25 mLs (0.5 mg total) into the vein every 2 (two) hours as needed for anxiety or sedation (For procedures).   magnesium hydroxide 400 MG/5ML suspension Commonly known as:  MILK OF MAGNESIA Place 30 mLs into feeding tube daily.   mouth rinse Liqd solution 15 mLs by Mouth Rinse route 2 times daily at 12 noon and 4 pm.   multivitamin Liqd Place 15 mLs into feeding tube daily.   naloxone 0.4 MG/ML injection Commonly known as:  NARCAN Inject 0.2 mLs (0.08 mg total) into the vein as needed (to reverse respiratory depression.  May give every 3 minutes prn. Not to exceed 0.4 mg total dose.).   ondansetron 4 MG/2ML Soln injection Commonly known as:  ZOFRAN Inject 2 mLs (4 mg total) into the vein every 4 (four) hours as needed for nausea or vomiting.   pantoprazole sodium 40 mg/20 mL Pack Commonly known as:  PROTONIX Place 20 mLs (40 mg total) into feeding tube daily.   pseudoephedrine 30 MG tablet Commonly known as:  SUDAFED Take 1 tablet (30 mg total) by mouth every 6 (six) hours as needed for congestion (cough).   senna 8.6 MG Tabs tablet Commonly known as:  SENOKOT Place 1 tablet (8.6 mg total) into feeding tube daily.   sodium chloride flush 0.9 % Soln Commonly known as:  NS 10-40 mLs by Intracatheter route as needed (flush).       Disposition:  Rehabilitation,  inpatient   Final Dx: Cranioplasty  Discharge Instructions    Call MD for:  difficulty breathing, headache or visual disturbances    Complete by:  As directed    Call MD for:  persistant nausea and vomiting    Complete by:  As directed    Call MD for:  redness, tenderness, or signs of infection (pain, swelling, redness, odor or green/yellow discharge around incision site)    Complete by:  As directed    Call MD for:  severe uncontrolled pain    Complete by:  As directed    Call MD for:  temperature >100.4    Complete by:  As directed    Diet - low sodium heart  healthy    Complete by:  As directed    Discharge wound care:    Complete by:  As directed    Remove staples in 10-14 days   Increase activity slowly    Complete by:  As directed          Signed: Sanari Offner S 03/22/2017, 10:51 AM

## 2017-03-22 NOTE — Progress Notes (Signed)
After speaking with interpreter, patient is receptive to taking some meds. He refused food but when food is put in fork/sppon and offered, patient accepts food. Offered ensure-patient accepts it. Patient consumed all of his food this afternoon. Sister is at his bedside. Patient states he likes ensure and would buy some for his sister's children.  Sister is very happy with interpreter. She states "Mr. [name]  is really [place]"  Patient continues to be cooperative this afternoon. Will continue to monitor.

## 2017-03-22 NOTE — Progress Notes (Signed)
Trish MageLogue, Kalem Rockwell M, RN Rehab Admission Coordinator Signed Physical Medicine and Rehabilitation  PMR Pre-admission Date of Service: 03/22/2017 1:24 PM  Related encounter: [facility]Current[facility]       [] Hide copied text PMR Admission Coordinator Pre-Admission Assessment  Patient: [name] is an 51 y.o., male MRN: [id number] DOB: [birth year] Height: 5\' 2"  (157.5 cm) Weight: 60.3 kg (133 lb)                                                                                                                                                  Insurance Information Self pay - no insurance  Emergency Contact Information        Contact Information    Name Relation Home Work Mobile   CorningGarcia,[name] Sister [phone]       Current Medical History  Patient Admitting Diagnosis:  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 Per chart review patient lives with sister was independent prior to admission.Plan is for sister and brother-in-law to provide assistance as needed at time of discharge.Presented 01/10/2017 fter being found down from a supposed fall. CT of the head and imaging revealed intracranial hemorrhage with a 4.5 cm right frontal intraparenchymal hemorrhage, 6 mm right subdural hematoma and other scattered areas of intraparenchymal and subarachnoid hemorrhage with right to left midline shift. Nondisplaced vertical fracture through the left temporal bone.CT cervical spine negative.CT abdomen and pelvis with no evidence of acute injury.UDS negative.neurosurgery Dr. Marikay Alaravid Jones advised conservative care with ICP monitor.follow-up ENT Dr. Jenne PaneBates for temporal bone fracture no surgical intervention required.follow-up CT imaging showed increased right to left shift greater than 1 cm. Basal  cisterns slightly effaced.Patient withsteady rise in his ICP and not responsive to heavy sedation. .Underwent right frontal temporal parietal craniectomy for hematoma evacuation 01/14/2017 per Dr. Mikal Planeabell with placement of bone flap in the abdomen. Keppra for seizure prophylaxis.Patient was extubated 01/19/2017.Nasogastric tube in place for nutritional support patient remains NPO.Later placed on subcutaneous heparin for DVT prophylaxis. Placed on Ancef 01/18/2017 for suspected pneumonia 5 days.Bouts of urinary retention with Urecholine addedas well as greater than 100,000 Escherichia coli UTI placed on Cipro 01/28/2017. Physical therapy evaluation completed with recommendations of physical medicine rehabilitation consult.patient was admitted for a comprehensive rehabilitation program04/12/2016. Patient had been maintained on Ritalin as well as Seroquel for mood stimulation and improve awareness. His voiding continued to improve and Urecholine ongoing 10 mg 3 times a day. Blood sugars monitored with hemoglobin A1c of 10.4 and Lantus insulin adjusted. He was ambulating up to 200 feet with max verbal cues for sequencing. He was able to communicate his needs with noted language barrier. Neurosurgery was consulted for plan cranioplasty and was discharged acute care services 03/19/2017 for  cranioplasty using original bone flap from decompressive craniectomy which was retrieved from the abdomen per Dr. Marikay Alar. Therapies have been resumed and patient is admitted back to inpatient rehabilitation services to continue ongoing comprehensive inpatient rehabilitation services to complete education and plan discharge.   Past Medical History      Past Medical History:  Diagnosis Date  . Diabetes mellitus without complication (HCC)     Family History  family history includes Diabetes in his father and mother.  Prior Rehab/Hospitalizations: Was on CIR 42/18 to 03/19/17.  Returned to acute to have bone flap  replaced in cranium.  Has the patient had major surgery during 100 days prior to admission? Yes  Current Medications   Current Facility-Administered Medications:  .  0.9 % NaCl with KCl 20 mEq/ L  infusion, , Intravenous, Continuous, Tia Alert, MD, Last Rate: 10 mL/hr at 03/20/17 1619 .  acetaminophen (TYLENOL) tablet 650 mg, 650 mg, Oral, Q4H PRN, Tia Alert, MD, 650 mg at 03/20/17 2006 .  bethanechol (URECHOLINE) tablet 10 mg, 10 mg, Oral, TID, Tia Alert, MD, 10 mg at 03/22/17 1212 .  ciprofloxacin (CIPRO) tablet 500 mg, 500 mg, Oral, BID, Tia Alert, MD, 500 mg at 03/21/17 2054 .  diphenhydrAMINE (BENADRYL) capsule 25 mg, 25 mg, Oral, Q6H, Tia Alert, MD, 25 mg at 03/22/17 1259 .  docusate sodium (COLACE) capsule 100 mg, 100 mg, Oral, Daily, Tia Alert, MD, 100 mg at 03/21/17 1024 .  guaiFENesin (ROBITUSSIN) 100 MG/5ML solution 200 mg, 10 mL, Oral, Q4H PRN, Tia Alert, MD .  hydrALAZINE (APRESOLINE) injection 10 mg, 10 mg, Intravenous, Q2H PRN, Tia Alert, MD .  HYDROcodone-acetaminophen (HYCET) 7.5-325 mg/15 ml solution 10 mL, 10 mL, Oral, Q4H PRN, Tia Alert, MD .  insulin aspart (novoLOG) injection 0-15 Units, 0-15 Units, Subcutaneous, TID WC, Tia Alert, MD, 3 Units at 03/22/17 1254 .  labetalol (NORMODYNE,TRANDATE) injection 10-40 mg, 10-40 mg, Intravenous, Q10 min PRN, Tia Alert, MD .  levETIRAcetam (KEPPRA) 100 MG/ML solution 750 mg, 750 mg, Oral, BID, Tia Alert, MD, 750 mg at 03/22/17 1157 .  multivitamin with minerals tablet 1 tablet, 1 tablet, Oral, Daily, Tia Alert, MD, 1 tablet at 03/22/17 1212 .  ondansetron (ZOFRAN) tablet 4 mg, 4 mg, Oral, Q4H PRN **OR** ondansetron (ZOFRAN) injection 4 mg, 4 mg, Intravenous, Q4H PRN, Tia Alert, MD, 4 mg at 03/19/17 2025 .  pantoprazole (PROTONIX) EC tablet 40 mg, 40 mg, Oral, Daily, Tia Alert, MD, 40 mg at 03/22/17 1211 .  senna (SENOKOT) tablet 8.6 mg, 1 tablet, Oral,  Daily, Tia Alert, MD, 8.6 mg at 03/22/17 1211 .  sodium chloride flush (NS) 0.9 % injection 10-40 mL, 10-40 mL, Intracatheter, PRN, Tia Alert, MD  Patients Current Diet: DIET DYS 2 Room service appropriate? Yes; Fluid consistency: Thin Diet - low sodium heart healthy  Precautions / Restrictions Precautions Precautions: Fall Restrictions Weight Bearing Restrictions: No   Has the patient had 2 or more falls or a fall with injury in the past year?Yes.  Underwent right craniectomy 01/14/17.  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 None  Prior Functional Level Prior Function Level of Independence: Independent Comments: According to chart review, before original accident pt  lived with roommate, works as Administrator, and drives.  Self Care: Did the patient need help bathing, dressing, using the toilet or eating?  Independent  Indoor Mobility: Did the patient need assistance with walking from room to room (with or without device)? Independent  Stairs: Did the patient need assistance with internal or external stairs (with or without device)? Independent  Functional Cognition: Did the patient need help planning regular tasks such as shopping or remembering to take medications? Independent  Current Functional Level Cognition  Overall Cognitive Status: Impaired/Different from baseline Current Attention Level: Sustained Orientation Level: Oriented to person, Oriented to place, Disoriented to time, Disoriented to situation Following Commands: Follows one step commands inconsistently General Comments: continues to demonstrate impuslivity and poor awareness. Patient disoriented, believes he is in [place] despite multiple reorientation attempts      Extremity Assessment (includes Sensation/Coordination)  Upper Extremity Assessment: RUE deficits/detail, LUE deficits/detail RUE Deficits / Details: strength grossly 4/5 RUE Coordination: decreased fine motor, decreased gross motor LUE Deficits / Details: strength grossly 3+/5 LUE Coordination: decreased fine motor, decreased gross motor  Lower Extremity Assessment: Defer to PT evaluation    ADLs  Overall ADL's : Needs assistance/impaired Grooming: Wash/dry face, Oral care, Minimal assistance, Sitting Grooming Details (indicate cue type and reason): verbal and tactile cues to complete tasks, re-direction for attention (Pt distracted by video interpreter) Upper Body Bathing: Minimal assistance Lower Body Bathing: Minimal assistance Upper Body Dressing : Minimal assistance Lower Body Dressing: Minimal assistance, Cueing for sequencing, Sitting/lateral leans Lower Body Dressing Details (indicate cue type and reason): able to don/doff socks, with re-direction to task Toilet Transfer: Moderate assistance, Ambulation Toileting- Clothing Manipulation and Hygiene: Minimal assistance Tub/ Shower Transfer: Moderate assistance Functional mobility during ADLs: Moderate assistance, Cueing for safety (1 HHA) General ADL Comments: Unsure if Pt has trouble following directions due to language difference or cognitive abilities    Mobility  Overal bed mobility: Needs Assistance Bed Mobility: Supine to Sit, Sit to Supine Supine to sit: Min guard Sit to supine: Min guard General bed mobility comments: cues for direction, pt able to perform without physical assist    Transfers  Overall transfer level: Needs assistance Equipment used: 1 person hand held assist Transfers: Sit to/from Stand Sit to Stand: Min assist General transfer comment: vc for safe hand placement    Ambulation / Gait / Stairs / Wheelchair Mobility  Ambulation/Gait Ambulation/Gait assistance: Haematologist (Feet): 200 Feet Assistive device: 1 person hand held assist Gait Pattern/deviations: Shuffle, Wide base of support General Gait Details: patient with decreased insight and awareness during ambulation, easily distracted causing multiple LOB. Worse in enclosed areas. Min assist throughout activity. Attempted higher level balance but patient unable to maintain stability Gait velocity interpretation: Below normal speed for age/gender    Posture / Balance Dynamic Sitting Balance Sitting balance - Comments: able to maintain sitting EOB for grooming tasks and to don/doff socks Balance Overall balance assessment: Needs assistance Sitting-balance support: Feet supported, No upper extremity supported Sitting balance-Leahy Scale: Good Sitting balance - Comments: able to maintain sitting EOB for grooming tasks and to don/doff socks Standing balance support: Single extremity supported, During functional activity Standing balance-Leahy Scale: Fair High level balance activites: Direction changes, Turns, Head turns High Level Balance Comments: poor stability, assist required to prevent LOB    Special needs/care consideration BiPAP/CPAP No CPM No Continuous Drip IV 0.9% NS with 20 MEQ KCL at 10 mL/hr Dialysis No     Life Vest No Oxygen  02 2L [place] Special Bed No Trach Size No Wound Vac (area) No     Skin Right scalp incision, Right abdominal incision                            Bowel mgmt: Last BM 03/22/17 Bladder mgmt: Urinary catheter in place Diabetic mgmt: Yes, diabetic    Previous Home Environment Living Arrangements: Spouse/significant other, Other relatives  Lives With: Family Available Help at Discharge: Family, Available 24 hours/day Type of Home: House Home Layout: Two level, Able to live on main level with bedroom/bathroom Home Access: Level entry Bathroom Shower/Tub: Engineer, manufacturing systems: Standard Home Care Services: No Additional Comments: Home information  gleaned from PT eval  Discharge Living Setting Plans for Discharge Living Setting: House, Lives with (comment) (Plans for home with sister to townhouse.) Type of Home at Discharge: House Teacher, adult education) Discharge Home Layout: Two level, 1/2 bath on main level, Bed/bath upstairs (Plans to stay on main level in living room/den area.) Alternate Level Stairs-Number of Steps: 15 Discharge Home Access: Level entry Does the patient have any problems obtaining your medications?: No  Social/Family/Support Systems Patient Roles: Other (Comment) (Has sister and brother in law.) Contact Information: [name] - sister Anticipated Caregiver: sister and brother in law Anticipated Caregiver's Contact Information: [name] - sister - [phone] Ability/Limitations of Caregiver: Sister plans to be the caregiver, brother in law to assist when able Caregiver Availability: Other (Comment) (Sister aware of the potential need for 24/7 assistance.) Discharge Plan Discussed with Primary Caregiver: Yes Is Caregiver In Agreement with Plan?: Yes Does Caregiver/Family have Issues with Lodging/Transportation while Pt is in Rehab?: No  Goals/Additional Needs Patient/Family Goal for Rehab: PT/OT/SLP mod I and supervision Expected length of stay: 7-14 days Cultural Considerations: Speaks spanish, needs interpreter, speaks minimal English Dietary Needs: Dys 2, thin liquids diet Equipment Needs: TBD Pt/Family Agrees to Admission and willing to participate: Yes Program Orientation Provided & Reviewed with Pt/Caregiver Including Roles  & Responsibilities: Yes  Decrease burden of Care through IP rehab admission: N/A  Possible need for SNF placement upon discharge: Not planned, but sister is worried about will patient be safe in her home with her children and will children be safe with patient.  Patient Condition: This patient's medical and functional status has changed since the consult dated: Original consult  dated 01/21/17 in which the Rehabilitation Physician determined and documented that the patient's condition is appropriate for intensive rehabilitative care in an inpatient rehabilitation facility. See "History of Present Illness" (above) for medical update. Functional changes are:  Currently requiring min assist for transfers and min assist to ambulate +1 HHA. Patient's medical and functional status update has been discussed with the Rehabilitation physician and patient remains appropriate for inpatient rehabilitation. Will admit to inpatient rehab today.  Preadmission Screen Completed By:  Trish Mage, 03/22/2017 1:42 PM ______________________________________________________________________   Discussed status with Dr. Allena Katz on 03/22/17 at 1342 and received telephone approval for admission today.  Admission Coordinator:  Trish Mage, time 1342/Date 03/22/17       Cosigned by: Marcello Fennel, MD at 03/22/2017 2:33 PM  Revision History

## 2017-03-22 NOTE — Progress Notes (Signed)
Pt arrived to unit;  Temp on admission 101.8. Marissa NestlePam Love PA-C aware, orders received. Report to night shift RN.

## 2017-03-22 NOTE — PMR Pre-admission (Signed)
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 Per chart review patient lives with sister was independent prior to admission.Plan is for sister and brother-in-law to provide assistance as needed at time of discharge.Presented 01/10/2017 fter being found down from a supposed fall. CT of the head and imaging revealed intracranial hemorrhage with a 4.5 cm right frontal intraparenchymal hemorrhage, 6 mm right subdural hematoma and other scattered areas of intraparenchymal and subarachnoid hemorrhage with right to left midline shift. Nondisplaced vertical fracture through the left temporal bone.CT cervical spine negative.CT abdomen and pelvis with no evidence of acute injury.UDS negative.neurosurgery Dr. Marikay Alaravid Jones advised conservative care with ICP monitor.follow-up ENT Dr. Jenne PaneBates for temporal bone fracture no surgical intervention required.follow-up CT imaging showed increased right to left shift greater than 1 cm. Basal cisterns slightly effaced.Patient withsteady rise in his ICP and not responsive to heavy sedation. .Underwent right frontal temporal parietal craniectomy for hematoma evacuation 01/14/2017 per Dr. Mikal Planeabell with placement of bone flap in the abdomen. Keppra for seizure prophylaxis.Patient was extubated 01/19/2017.Nasogastric tube in place for nutritional support patient remains NPO.Later placed on subcutaneous heparin for DVT prophylaxis.  Placed on Ancef 01/18/2017 for suspected pneumonia 5 days.Bouts of urinary retention with Urecholine addedas well as greater than 100,000 Escherichia coli UTI placed on Cipro 01/28/2017. Physical therapy evaluation completed with recommendations of physical medicine rehabilitation consult.patient was admitted for a comprehensive rehabilitation program 02/01/2017. Patient had been maintained on Ritalin as well as Seroquel for mood stimulation and improve awareness. His voiding continued to improve and Urecholine ongoing 10 mg 3 times a day. Blood sugars monitored with hemoglobin A1c of 10.4 and Lantus insulin adjusted. He was ambulating up to 200 feet with max verbal cues for sequencing. He was able to communicate his needs with noted language barrier. Neurosurgery was consulted for plan cranioplasty and was discharged acute care services 03/19/2017 for cranioplasty using original bone flap from decompressive craniectomy which was retrieved from the abdomen per Dr. Marikay Alaravid Jones. Therapies have been resumed and patient is admitted back to inpatient rehabilitation services to continue ongoing comprehensive inpatient rehabilitation services to complete education and plan discharge.   Past Medical History  Past Medical History:  Diagnosis Date  . Diabetes mellitus without complication (HCC)     Family History  family history includes Diabetes in his father and mother.  Prior Rehab/Hospitalizations: Was on CIR 42/18 to 03/19/17.  Returned to acute to have bone flap replaced in cranium.  Has the patient had major surgery during 100 days prior to admission? Yes  Current Medications   Current Facility-Administered Medications:  .  0.9 % NaCl with KCl 20 mEq/ L  infusion, , Intravenous, Continuous, [name], [name], MD, Last Rate: 10 mL/hr at 03/20/17 1619 .  acetaminophen (TYLENOL) tablet 650 mg, 650 mg, Oral, Q4H PRN, [name], [name], MD, 650 mg at 03/20/17 2006 .  bethanechol (URECHOLINE) tablet 10 mg, 10  mg, Oral, TID, [name], [name],  MD, 10 mg at 03/22/17 1212 .  ciprofloxacin (CIPRO) tablet 500 mg, 500 mg, Oral, BID, Tia Alert, MD, 500 mg at 03/21/17 2054 .  diphenhydrAMINE (BENADRYL) capsule 25 mg, 25 mg, Oral, Q6H, Tia Alert, MD, 25 mg at 03/22/17 1259 .  docusate sodium (COLACE) capsule 100 mg, 100 mg, Oral, Daily, Tia Alert, MD, 100 mg at 03/21/17 1024 .  guaiFENesin (ROBITUSSIN) 100 MG/5ML solution 200 mg, 10 mL, Oral, Q4H PRN, Tia Alert, MD .  hydrALAZINE (APRESOLINE) injection 10 mg, 10 mg, Intravenous, Q2H PRN, Tia Alert, MD .  HYDROcodone-acetaminophen (HYCET) 7.5-325 mg/15 ml solution 10 mL, 10 mL, Oral, Q4H PRN, Tia Alert, MD .  insulin aspart (novoLOG) injection 0-15 Units, 0-15 Units, Subcutaneous, TID WC, Tia Alert, MD, 3 Units at 03/22/17 1254 .  labetalol (NORMODYNE,TRANDATE) injection 10-40 mg, 10-40 mg, Intravenous, Q10 min PRN, Tia Alert, MD .  levETIRAcetam (KEPPRA) 100 MG/ML solution 750 mg, 750 mg, Oral, BID, Tia Alert, MD, 750 mg at 03/22/17 1157 .  multivitamin with minerals tablet 1 tablet, 1 tablet, Oral, Daily, Tia Alert, MD, 1 tablet at 03/22/17 1212 .  ondansetron (ZOFRAN) tablet 4 mg, 4 mg, Oral, Q4H PRN **OR** ondansetron (ZOFRAN) injection 4 mg, 4 mg, Intravenous, Q4H PRN, Tia Alert, MD, 4 mg at 03/19/17 2025 .  pantoprazole (PROTONIX) EC tablet 40 mg, 40 mg, Oral, Daily, Tia Alert, MD, 40 mg at 03/22/17 1211 .  senna (SENOKOT) tablet 8.6 mg, 1 tablet, Oral, Daily, Tia Alert, MD, 8.6 mg at 03/22/17 1211 .  sodium chloride flush (NS) 0.9 % injection 10-40 mL, 10-40 mL, Intracatheter, PRN, Tia Alert, MD  Patients Current Diet: DIET DYS 2 Room service appropriate? Yes; Fluid consistency: Thin Diet - low sodium heart healthy  Precautions / Restrictions Precautions Precautions: Fall Restrictions Weight Bearing Restrictions: No   Has the patient had 2 or more falls or a fall with injury in the  past year?Yes.  Underwent right craniectomy 01/14/17.  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 None  Prior Functional Level Prior Function Level of Independence: Independent Comments: According to chart review, before original accident pt lived with roommate, works as Administrator, and drives.  Self Care: Did the patient need help bathing, dressing, using the toilet or eating?  Independent  Indoor Mobility: Did the patient need assistance with walking from room to room (with or without device)? Independent  Stairs: Did the patient need assistance with internal or external stairs (with or without device)? Independent  Functional Cognition: Did the patient need help planning regular tasks such as shopping or remembering to take medications? Independent  Current Functional Level Cognition  Overall Cognitive Status: Impaired/Different from baseline Current Attention Level: Sustained Orientation Level: Oriented to person, Oriented to place, Disoriented to time, Disoriented to situation Following Commands: Follows one step commands inconsistently General Comments: continues to demonstrate impuslivity and poor awareness. Patient disoriented, believes he is in [place] despite multiple reorientation attempts    Extremity Assessment (includes Sensation/Coordination)  Upper Extremity Assessment: RUE deficits/detail, LUE deficits/detail RUE Deficits / Details: strength grossly 4/5 RUE Coordination: decreased fine motor, decreased gross motor LUE Deficits / Details: strength grossly 3+/5 LUE Coordination: decreased fine motor, decreased gross motor  Lower Extremity Assessment: Defer  to PT evaluation    ADLs  Overall ADL's : Needs assistance/impaired Grooming: Wash/dry face,  Oral care, Minimal assistance, Sitting Grooming Details (indicate cue type and reason): verbal and tactile cues to complete tasks, re-direction for attention (Pt distracted by video interpreter) Upper Body Bathing: Minimal assistance Lower Body Bathing: Minimal assistance Upper Body Dressing : Minimal assistance Lower Body Dressing: Minimal assistance, Cueing for sequencing, Sitting/lateral leans Lower Body Dressing Details (indicate cue type and reason): able to don/doff socks, with re-direction to task Toilet Transfer: Moderate assistance, Ambulation Toileting- Clothing Manipulation and Hygiene: Minimal assistance Tub/ Shower Transfer: Moderate assistance Functional mobility during ADLs: Moderate assistance, Cueing for safety (1 HHA) General ADL Comments: Unsure if Pt has trouble following directions due to language difference or cognitive abilities    Mobility  Overal bed mobility: Needs Assistance Bed Mobility: Supine to Sit, Sit to Supine Supine to sit: Min guard Sit to supine: Min guard General bed mobility comments: cues for direction, pt able to perform without physical assist    Transfers  Overall transfer level: Needs assistance Equipment used: 1 person hand held assist Transfers: Sit to/from Stand Sit to Stand: Min assist General transfer comment: vc for safe hand placement    Ambulation / Gait / Stairs / Wheelchair Mobility  Ambulation/Gait Ambulation/Gait assistance: Architect (Feet): 200 Feet Assistive device: 1 person hand held assist Gait Pattern/deviations: Shuffle, Wide base of support General Gait Details: patient with decreased insight and awareness during ambulation, easily distracted causing multiple LOB. Worse in enclosed areas. Min assist throughout activity. Attempted higher level balance but patient unable to maintain stability Gait velocity interpretation: Below normal speed for age/gender    Posture / Balance Dynamic Sitting  Balance Sitting balance - Comments: able to maintain sitting EOB for grooming tasks and to don/doff socks Balance Overall balance assessment: Needs assistance Sitting-balance support: Feet supported, No upper extremity supported Sitting balance-Leahy Scale: Good Sitting balance - Comments: able to maintain sitting EOB for grooming tasks and to don/doff socks Standing balance support: Single extremity supported, During functional activity Standing balance-Leahy Scale: Fair High level balance activites: Direction changes, Turns, Head turns High Level Balance Comments: poor stability, assist required to prevent LOB    Special needs/care consideration BiPAP/CPAP No CPM No Continuous Drip IV 0.9% NS with 20 MEQ KCL at 10 mL/hr Dialysis No     Life Vest No Oxygen 02 2L [place] Special Bed No Trach Size No Wound Vac (area) No     Skin Right scalp incision, Right abdominal incision                            Bowel mgmt: Last BM 03/22/17 Bladder mgmt: Urinary catheter in place Diabetic mgmt: Yes, diabetic    Previous Home Environment Living Arrangements: Spouse/significant other, Other relatives  Lives With: Family Available Help at Discharge: Family, Available 24 hours/day Type of Home: House Home Layout: Two level, Able to live on main level with bedroom/bathroom Home Access: Level entry Bathroom Shower/Tub: Engineer, manufacturing systems: Standard Home Care Services: No Additional Comments: Home information gleaned from PT eval  Discharge Living Setting Plans for Discharge Living Setting: House, Lives with (comment) (Plans for home with sister to townhouse.) Type of Home at Discharge: House (Townhouse) Discharge Home Layout: Two level, 1/2 bath on main level, Bed/bath upstairs (Plans to stay on main level in living room/den area.) Alternate Level Stairs-Number of Steps: 15 Discharge Home Access: Level entry  Does the patient have any problems obtaining your medications?:  No  Social/Family/Support Systems Patient Roles: Other (Comment) (Has sister and brother in law.) Contact Information: [name] - sister Anticipated Caregiver: sister and brother in law Anticipated Caregiver's Contact Information: [name] - sister - [phone] Ability/Limitations of Caregiver: Sister plans to be the caregiver, brother in law to assist when able Caregiver Availability: Other (Comment) (Sister aware of the potential need for 24/7 assistance.) Discharge Plan Discussed with Primary Caregiver: Yes Is Caregiver In Agreement with Plan?: Yes Does Caregiver/Family have Issues with Lodging/Transportation while Pt is in Rehab?: No  Goals/Additional Needs Patient/Family Goal for Rehab: PT/OT/SLP mod I and supervision Expected length of stay: 7-14 days Cultural Considerations: Speaks spanish, needs interpreter, speaks minimal English Dietary Needs: Dys 2, thin liquids diet Equipment Needs: TBD Pt/Family Agrees to Admission and willing to participate: Yes Program Orientation Provided & Reviewed with Pt/Caregiver Including Roles  & Responsibilities: Yes  Decrease burden of Care through IP rehab admission: N/A  Possible need for SNF placement upon discharge: Not planned, but sister is worried about will patient be safe in her home with her children and will children be safe with patient.  Patient Condition: This patient's medical and functional status has changed since the consult dated: Original consult dated 01/21/17 in which the Rehabilitation Physician determined and documented that the patient's condition is appropriate for intensive rehabilitative care in an inpatient rehabilitation facility. See "History of Present Illness" (above) for medical update. Functional changes are:  Currently requiring min assist for transfers and min assist to ambulate +1 HHA. Patient's medical and functional status update has been discussed with the Rehabilitation physician and patient remains  appropriate for inpatient rehabilitation. Will admit to inpatient rehab today.  Preadmission Screen Completed By:  Trish Mage, 03/22/2017 1:42 PM ______________________________________________________________________   Discussed status with Dr. Allena Katz on 03/22/17 at 1342 and received telephone approval for admission today.  Admission Coordinator:  Trish Mage, time 1342/Date 03/22/17

## 2017-03-22 NOTE — Progress Notes (Signed)
Consult Note Date of Service: 01/21/2017 8:19 AM   [name], MD  Physical Medicine and Rehabilitation  Expand All Collapse All   [] Hide copied text [] Hover for attribution information      Physical Medicine and Rehabilitation Consult Reason for Consult:TBI/right frontal intraparenchymal hemorrhage/SDH/SAH Referring Physician: Trauma services   HPI: [name] is a [name] with history of diabetes mellitus. Per chart review patient lives with sister was independent prior to admission. Presented 01/10/2017 fter being found down from a supposed fall. CT of the head and imaging revealed intracranial hemorrhage with a 4.5 cm right frontal intraparenchymal hemorrhage, 6 mm right subdural hematoma and other scattered areas of intraparenchymal and subarachnoid hemorrhage with right to left midline shift. Nondisplaced vertical fracture through the left temporal bone.CT cervical spine negative.CT abdomen and pelvis with no evidence of acute injury.UDS negative.neurosurgery Dr. Marikay Alar advised conservative care with ICP monitor.follow-up ENT Dr. Jenne Pane for temporal bone fracture no surgical intervention required.follow-up CT imaging showed increased right to left shift greater than 1 cm. Basal cisterns slightly effaced.Patient was steady rise in his ICP and not responsive to heavy sedation. .Underwent right frontal temporal parietal craniectomy for hematoma evacuation 01/14/2017 per Dr. Mikal Plane with placement of bone flap in the abdomen. Keppra for seizure prophylaxis.Patient was extubated 01/19/2017. Later placed on subcutaneous heparin for DVT prophylaxis. Bouts of urinary retention with Urecholine added. Physical therapy evaluation completed with recommendations of physical medicine rehabilitation consult.   Review of Systems  Unable to perform ROS: Acuity of condition       Past Medical History:  Diagnosis Date  . Diabetes  mellitus without complication [facility])    Past Surgical History:  Procedure Laterality Date  . CRANIOTOMY Right 01/13/2017   Procedure: 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, Placement of bone flap in abdomen;  Surgeon: Coletta Memos, MD;  Location: [facility] OR;  Service: Neurosurgery;  Laterality: Right;        Family History  Problem Relation Age of Onset  . Diabetes Mother   . Diabetes Father    Social History:  reports that he has never smoked. He has never used smokeless tobacco. He reports that he drinks alcohol. He reports that he does not use drugs. Allergies: No Known Allergies       Medications Prior to Admission  Medication Sig Dispense Refill  . metFORMIN (GLUCOPHAGE) 500 MG tablet Take 500 mg by mouth 2 (two) times daily with a meal.    . aspirin EC 81 MG tablet Take 1 tablet (81 mg total) by mouth daily. (Patient not taking: Reported on 01/11/2017) 90 tablet 3  . atorvastatin (LIPITOR) 40 MG tablet Take 1 tablet (40 mg total) by mouth daily at 6 PM. (Patient not taking: Reported on 01/11/2017) 90 tablet 0  . glipiZIDE (GLUCOTROL) 5 MG tablet Take 1 tablet (5 mg total) by mouth daily before breakfast. (Patient not taking: Reported on 01/11/2017) 90 tablet 1  . Linagliptin-Metformin HCl 2.03-999 MG TABS Take 1 tablet by mouth 2 (two) times daily. With food (Patient not taking: Reported on 01/11/2017) 180 tablet 1  . lisinopril (PRINIVIL,ZESTRIL) 2.5 MG tablet Take 1 tablet (2.5 mg total) by mouth daily. (Patient not taking: Reported on 01/11/2017) 90 tablet 0  . naproxen (NAPROSYN) 500 MG tablet Take 1 tablet (500 mg total) by mouth 2 (two) times daily with a meal. (Patient not taking: Reported on 01/11/2017) 30 tablet 0    Home: Home Living Family/patient  expects to be discharged to:: Private residence Living Arrangements: Other relatives (Sister) Available Help at Discharge: Family, Available 24 hours/day Type of Home: House Home Access: Level  entry Home Layout: Two level, Able to live on main level with bedroom/bathroom, Other (Comment) (Shower upstairs) Alternate Level Stairs-Number of Steps: 15 Alternate Level Stairs-Rails: Left Bathroom Shower/Tub: Tub/shower unit Home Equipment: None Additional Comments: Pt only Spanish-speaking. Sister reports she plans to have pt live with her upon d/c, as she will be available for 24/7 assist.   Functional History: Prior Function Level of Independence: Independent Comments: According to sister, 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 Functional Status:  Mobility: Bed Mobility Overal bed mobility: Needs Assistance Bed Mobility: Supine to Sit, Sit to Supine Supine to sit: Total assist Sit to supine: Total assist  ADL:  Cognition: Cognition Overall Cognitive Status: Impaired/Different from baseline Arousal/Alertness: Lethargic Orientation Level: Other (comment) (m[place]) Attention: Focused Focused Attention: Impaired Focused Attention Impairment: Verbal basic, Functional basic Problem Solving: Impaired Problem Solving Impairment: Functional basic Safety/Judgment: Impaired [place] [id number] Scales of Cognitive Functioning: Generalized response (some emerging III) Cognition Arousal/Alertness: Lethargic Behavior During Therapy: Flat affect Overall Cognitive Status: Impaired/Different from baseline Area of Impairment: Attention, Following commands, Awareness, Problem solving, [place] level, Orientation Orientation Level: Disoriented to Current Attention Level: Focused Following Commands: Follows one step commands inconsistently Awareness: Intellectual Problem Solving: Slow processing, Decreased initiation, Requires verbal cues, Requires tactile cues General Comments: Decreased ability to participate with therapy today, requiring max multimodal cues to open eyes; inconsistently follows one-step commands to open eyes and look at therapist. Able to visually track  therapist to L-side and midline; intermittent tracking to R-side. Nonpurposeful response with fingers to painful stimulus in BUE; no response in BLE.   Blood pressure 137/70, pulse 91, temperature 97.7 F (36.5 C), temperature source Axillary, resp. rate 17, height 5\' 1"  (1.549 m), weight 63 kg (138 lb 14.2 oz), SpO2 96 %. Physical Exam  Vitals reviewed. HENT:  Craniotomy site clean and dry  Eyes:  Pupils sluggish to light  Neck: Normal range of motion. Neck supple. No thyromegaly present.  Cardiovascular: Normal rate and regular rhythm.   Respiratory: Effort normal and breath sounds normal. No respiratory distress.  GI: Soft. Bowel sounds are normal. He exhibits no distension.  Neurological:  Patient very lethargic with fall back asleep during exam. Made minimal eye contact with examiner. He did not follow commands and remained nonverbal. Moves all 4 limbs but inconsistent due to arousal. Sensed more pain actually in LUE today.     Lab Results Last 24 Hours       Results for orders placed or performed during the hospital encounter of 01/10/17 (from the past 24 hour(s))  Glucose, capillary     Status: Abnormal   Collection Time: 01/20/17 11:46 AM  Result Value Ref Range   Glucose-Capillary 225 (H) 65 - 99 mg/dL   Comment 1 Notify RN    Comment 2 Document in Chart   Glucose, capillary     Status: Abnormal   Collection Time: 01/20/17  3:22 PM  Result Value Ref Range   Glucose-Capillary 192 (H) 65 - 99 mg/dL  Glucose, capillary     Status: Abnormal   Collection Time: 01/20/17  7:52 PM  Result Value Ref Range   Glucose-Capillary 171 (H) 65 - 99 mg/dL  Glucose, capillary     Status: Abnormal   Collection Time: 01/20/17 11:11 PM  Result Value Ref Range   Glucose-Capillary 194 (H)  65 - 99 mg/dL  Basic metabolic panel     Status: Abnormal   Collection Time: 01/21/17  5:00 AM  Result Value Ref Range   Sodium 138 135 - 145 mmol/L   Potassium 3.8 3.5 - 5.1 mmol/L    Chloride 103 101 - 111 mmol/L   CO2 26 22 - 32 mmol/L   Glucose, Bld 157 (H) 65 - 99 mg/dL   BUN 17 6 - 20 mg/dL   Creatinine, Ser 1.61 (L) 0.61 - 1.24 mg/dL   Calcium 7.8 (L) 8.9 - 10.3 mg/dL   GFR calc non Af Amer >60 >60 mL/min   GFR calc Af Amer >60 >60 mL/min   Anion gap 9 5 - 15  Glucose, capillary     Status: Abnormal   Collection Time: 01/21/17  5:12 AM  Result Value Ref Range   Glucose-Capillary 153 (H) 65 - 99 mg/dL  Glucose, capillary     Status: Abnormal   Collection Time: 01/21/17  7:58 AM  Result Value Ref Range   Glucose-Capillary 139 (H) 65 - 99 mg/dL   Comment 1 Notify RN    Comment 2 Document in Chart       Imaging Results (Last 48 hours)  Dg Abd Portable 1v  Result Date: 01/19/2017 CLINICAL DATA:  Check for feeding catheter placement EXAM: PORTABLE ABDOMEN - 1 VIEW COMPARISON:  01/17/2017 FINDINGS: Previously seen nasogastric catheter has been removed and a feeding catheter has been placed in lies within the junction of the third and fourth portions of the duodenum just short of the ligamenta trans. Scattered large and small bowel gas is noted. No obstructive changes are seen. No bony abnormality is noted. Rounded density is noted over the right mid abdomen likely extrinsic to the patient. Clinical correlation is recommended. IMPRESSION: Feeding catheter within the distal duodenum as described. Vague rounded density overlying the right mid abdomen laterally likely related to extrinsic artifact. Clinical correlation is recommended. Electronically Signed   By: Alcide Clever M.D.   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 Does the need for close, 24 hr/day medical supervision in concert with the patient's rehab needs make it unreasonable for this patient to be served in a less intensive setting? Yes 2. Co-Morbidities requiring supervision/potential complications: dm2, post-tbi sequelae 3. Due to bladder  management, bowel management, safety, skin/wound care, disease management, medication administration, pain management and patient education, does the patient require 24 hr/day rehab nursing? Yes 4. Does the patient require coordinated care of a physician, rehab nurse, PT (1-2 hrs/day, 5 days/week), OT (1-2 hrs/day, 5 days/week) and SLP (1-2 hrs/day, 5 days/week) to address physical and functional deficits in the context of the above medical diagnosis(es)? Yes Addressing deficits in the following areas: balance, endurance, locomotion, strength, transferring, bowel/bladder control, bathing, dressing, feeding, grooming, toileting, cognition, speech, language, swallowing and psychosocial support 5. Can the patient actively participate in an intensive therapy program of at least 3 hrs of therapy per day at least 5 days per week? Yes and Potentially 6. The potential for patient to make measurable gains while on inpatient rehab is good 7. Anticipated functional outcomes upon discharge from inpatient rehab are modified independent and supervision  with PT, modified independent, supervision and min assist with OT, supervision and min assist with SLP. 8. Estimated rehab length of stay to reach the above functional goals is: potentially 18-25 days 9. Does the patient have adequate social supports and living environment to accommodate  these discharge functional goals? Yes and Potentially 10. Anticipated D/C setting: Home 11. Anticipated post D/C treatments: HH therapy and Outpatient therapy 12. Overall Rehab/Functional Prognosis: excellent  RECOMMENDATIONS: This patient's condition is appropriate for continued rehabilitative care in the following setting: CIR Patient has agreed to participate in recommended program. N/A Note that insurance prior authorization may be required for reimbursement for recommended care.  Comment: Rehab Admissions Coordinator to follow up.  Thanks,  [name], MD,  [place] [facility]    Charlton Amor., PA-C 01/21/2017    Electronically signed by [name], MD at 01/21/2017 11:12 AM      ED to Hosp-Admission (Discharged) on 01/10/2017        Revision & Routing History

## 2017-03-23 ENCOUNTER — Inpatient Hospital Stay (HOSPITAL_COMMUNITY): Payer: Self-pay | Admitting: Speech Pathology

## 2017-03-23 ENCOUNTER — Inpatient Hospital Stay (HOSPITAL_COMMUNITY): Payer: Self-pay | Admitting: Occupational Therapy

## 2017-03-23 ENCOUNTER — Inpatient Hospital Stay (HOSPITAL_COMMUNITY): Payer: Self-pay | Admitting: Physical Therapy

## 2017-03-23 DIAGNOSIS — S062X4S Diffuse traumatic brain injury with loss of consciousness of 6 hours to 24 hours, sequela: Secondary | ICD-10-CM

## 2017-03-23 LAB — URINE CULTURE: Culture: NO GROWTH

## 2017-03-23 LAB — COMPREHENSIVE METABOLIC PANEL
ALBUMIN: 3.2 g/dL — AB (ref 3.5–5.0)
ALT: 17 U/L (ref 17–63)
AST: 14 U/L — AB (ref 15–41)
Alkaline Phosphatase: 44 U/L (ref 38–126)
Anion gap: 9 (ref 5–15)
BILIRUBIN TOTAL: 0.7 mg/dL (ref 0.3–1.2)
BUN: 14 mg/dL (ref 6–20)
CHLORIDE: 103 mmol/L (ref 101–111)
CO2: 27 mmol/L (ref 22–32)
Calcium: 9 mg/dL (ref 8.9–10.3)
Creatinine, Ser: 0.66 mg/dL (ref 0.61–1.24)
GFR calc Af Amer: >60 mL/min (ref >60)
GFR calc non Af Amer: >60 mL/min (ref >60)
GLUCOSE: 159 mg/dL — AB (ref 65–99)
POTASSIUM: 3.9 mmol/L (ref 3.5–5.1)
SODIUM: 139 mmol/L (ref 135–145)
Total Protein: 6.4 g/dL — ABNORMAL LOW (ref 6.5–8.1)

## 2017-03-23 LAB — CBC WITH DIFFERENTIAL/PLATELET
BASOS ABS: 0 10*3/uL (ref 0.0–0.1)
BASOS PCT: 0 %
EOS ABS: 0.1 10*3/uL (ref 0.0–0.7)
Eosinophils Relative: 3 %
HEMATOCRIT: 30 % — AB (ref 39.0–52.0)
Hemoglobin: 10.2 g/dL — ABNORMAL LOW (ref 13.0–17.0)
LYMPHS ABS: 1.3 10*3/uL (ref 0.7–4.0)
Lymphocytes Relative: 27 %
MCH: 28.1 pg (ref 26.0–34.0)
MCHC: 34 g/dL (ref 30.0–36.0)
MCV: 82.6 fL (ref 78.0–100.0)
MONO ABS: 0.3 10*3/uL (ref 0.1–1.0)
MONOS PCT: 5 %
Neutro Abs: 3.2 10*3/uL (ref 1.7–7.7)
Neutrophils Relative %: 65 %
Platelets: 236 10*3/uL (ref 150–400)
RBC: 3.63 MIL/uL — ABNORMAL LOW (ref 4.22–5.81)
RDW: 13 % (ref 11.5–15.5)
WBC: 4.9 10*3/uL (ref 4.0–10.5)

## 2017-03-23 LAB — GLUCOSE, CAPILLARY
GLUCOSE-CAPILLARY: 184 mg/dL — AB (ref 65–99)
GLUCOSE-CAPILLARY: 142 mg/dL — AB (ref 65–99)

## 2017-03-23 MED ORDER — INSULIN GLARGINE 100 UNIT/ML ~~LOC~~ SOLN
5.0000 [IU] | Freq: Every day | SUBCUTANEOUS | Status: DC
  Administered 2017-03-23: 5 [IU] via SUBCUTANEOUS
  Filled 2017-03-23: qty 0.05

## 2017-03-23 MED ORDER — METFORMIN HCL 500 MG PO TABS
500.0000 mg | ORAL_TABLET | Freq: Every day | ORAL | Status: DC
  Administered 2017-03-23 – 2017-03-27 (×5): 500 mg via ORAL
  Filled 2017-03-23 (×4): qty 1

## 2017-03-23 NOTE — Progress Notes (Signed)
PHYSICAL MEDICINE & REHABILITATION     PROGRESS NOTE    Subjective/Complaints: Seems to have slept well. Has minimal pain.   ROS: Limited due cognitive/behavioral   Objective: Vital Signs: Blood pressure 121/76, pulse 92, temperature 97.9 F (36.6 C), temperature source Oral, resp. rate 18, height 5\' 2"  (1.575 m), weight 60.3 kg (132 lb 15 oz), SpO2 100 %. Dg Chest 2 View  Result Date: 03/22/2017 CLINICAL DATA:  Possible sepsis. Acute onset of fever. Initial encounter. EXAM: CHEST  2 VIEW COMPARISON:  Chest radiograph performed 01/17/2017 FINDINGS: The lungs are well-aerated and clear. There is no evidence of focal opacification, pleural effusion or pneumothorax. The heart is normal in size; the mediastinal contour is within normal limits. No acute osseous abnormalities are seen. IMPRESSION: No acute cardiopulmonary process seen. Electronically Signed   By: Roanna Raider M.D.   On: 03/22/2017 20:19    Recent Labs  03/23/17 0550  WBC 4.9  HGB 10.2*  HCT 30.0*  PLT 236    Recent Labs  03/23/17 0550  NA 139  K 3.9  CL 103  GLUCOSE 159*  BUN 14  CREATININE 0.66  CALCIUM 9.0   CBG (last 3)   Recent Labs  03/22/17 1244 03/22/17 1711 03/22/17 2108  GLUCAP 184* 158* 123*    Wt Readings from Last 3 Encounters:  03/22/17 60.3 kg (132 lb 15 oz)  03/22/17 60.3 kg (133 lb)  07/31/16 68 kg (150 lb)    Physical Exam:  Constitutional: He appears well-developed and well-nourished.  HENT:  Cranioplasty site clean and dry with staples intact, non-tender Eyes: EOM are normal. Right eye exhibits no discharge. Left eye exhibits no discharge.  Pupils round and reactive to light  Neck: Normal range of motion. Neck supple. No thyromegaly present.  Cardiovascular: Normal rate, regular rhythm and normal heart sounds.   Respiratory: Effort normal and breath sounds normal.  GI: Soft. Bowel sounds are normal. He exhibits no distension.  Musculoskeletal: He exhibits no  edema or tenderness.  Neurological: He is alert.  Mtoor: 4+/5 throughout all 4, doesn't initiate as much with left lower extremity A&Ox1. Follows simple commands. Attention improved Skin: Skin is warm and dry.  Psychiatric: pleasant and cooperative   Assessment/Plan: 1. Cognitive and functional deficits secondary to TBI which require 3+ hours per day of interdisciplinary therapy in a comprehensive inpatient rehab setting. Physiatrist is providing close team supervision and 24 hour management of active medical problems listed below. Physiatrist and rehab team continue to assess barriers to discharge/monitor patient progress toward functional and medical goals.  Function:  Bathing Bathing position      Bathing parts      Bathing assist        Upper Body Dressing/Undressing Upper body dressing                    Upper body assist        Lower Body Dressing/Undressing Lower body dressing                                  Lower body assist        Toileting Toileting     Toileting steps completed by helper: Adjust clothing prior to toileting, Performs perineal hygiene, Adjust clothing after toileting    Toileting assist Assist level: Touching or steadying assistance (Pt.75%)   Transfers Chair/bed transfer  Secondary school teacherLocomotion Ambulation           Wheelchair          Cognition Comprehension    Expression    Social Interaction    Problem Solving    Memory     Medical Problem List and Plan: 1.  Decreased functional mobility secondary to right intraparenchymal hemorrhage-SDH-temporal bone fracture after fall. Status post right frontotemporoparietal craniectomy 01/14/2017 with cranioplasty 03/19/2017  -resume therapies today  -will dc enclosure bed and move to low bed  -consider moving him closer to nursing unit 2.  DVT Prophylaxis/Anticoagulation: SCDs. Patient is ambulatory 3. Pain Management: Hycet when necessary 4. Mood: Resume  Ritalin 10 mg twice a day, Seroquel 12.5 mg daily at bedtime 5. Neuropsych: This patient is capable of making decisions on not own behalf. 6. Skin/Wound Care: Routine skin checks 7. Fluids/Electrolytes/Nutrition: Routine I&O with follow-up chemistries 8. Seizure prophylaxis. Keppra 750 mg twice a day. 9. Dysphagia. Dysphagia #2 FEN liquids. Follow-up speech therapy 10. Urinary retention/Escherichia coli UTI. Urecholine 10 mg 3 times a day.   Complete course of Cipro 11. Diabetes mellitus peripheral neuropathy. Hemoglobin A1c 10.4. Glucotrol 5 mg twice a day, Lantus insulin 10 units daily at bedtime---decrease to 5units tonight and continue to Wean insulin to simplify care. Resume Glucophage at 500 mg daily(twice daily as prior to admission) 12. Hypertension. Patient on lisinopril 2.5 mg daily prior to admission. Resume as needed    LOS (Days) 1 A FACE TO FACE EVALUATION WAS PERFORMED  Ranelle OysterSWARTZ,ZACHARY T, MD 03/23/2017 9:02 AM

## 2017-03-23 NOTE — Plan of Care (Signed)
Problem: RH Ambulation Goal: LTG Patient will ambulate in controlled environment (PT) LTG: Patient will ambulate in a controlled environment, # of feet with assistance (PT). 150 ft with min cuing to attend to L  Problem: RH Stairs Goal: LTG Patient will ambulate up and down stairs w/assist (PT) LTG: Patient will ambulate up and down # of stairs with assistance (PT) Full flight of stairs with L rail

## 2017-03-23 NOTE — Evaluation (Signed)
Physical Therapy Assessment and Plan  Patient Details  Name: [name] MRN: [id number] Date of Birth: May 05, 1966  PT Diagnosis: Abnormality of gait, Coordination disorder, Difficulty walking, Hemiplegia, Impaired cognition and Muscle weakness Rehab Potential: Good ELOS: 1 week   Today's Date: 03/23/2017 PT Individual Time: 805-900 and 1403-1430 PT Individual Time Calculation (min): 55 min and 27 min    Problem List:  Patient Active Problem List   Diagnosis Date Noted  . SDH (subdural hematoma) ([place])   . Post-operative pain   . Agitation   . Attention and concentration deficit   . Seizure prophylaxis   . Dysphagia   . Urinary retention   . Type 2 diabetes mellitus with peripheral neuropathy (HCC)   . S/P craniotomy 03/19/2017  . Essential hypertension   . Agitated   . Lethargy   . Benign essential HTN   . Poorly controlled type 2 diabetes mellitus with peripheral neuropathy ([place])   . 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 Aphasia 02/01/2017  . Dysphagia, oropharyngeal 02/01/2017  . Intracerebral hemorrhage ([place]) 01/14/2017  . Fall 01/10/2017  . Subdural hemorrhage ([place]) 01/10/2017  . Diabetes type 2, uncontrolled ([place]) 06/13/2015    Past Medical History:  Past Medical History:  Diagnosis Date  . Diabetes mellitus without complication [facility])    Past Surgical History:  Past Surgical History:  Procedure Laterality Date  . CRANIOTOMY Right 01/13/2017   Procedure: 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, Placement of bone flap in abdomen;  Surgeon: Ashok Pall, MD;  Location: [facility];  Service: Neurosurgery;  Laterality: Right;  . CRANIOTOMY Right 03/19/2017   Procedure: Cranioplasty with placement of bone flap from abdomen;  Surgeon: Eustace Moore, MD;  Location: [place];  Service: Neurosurgery;  Laterality: Right;    Assessment & Plan Clinical Impression: Dontay Harm  Cardenasis a 51 y.o.right handedlimited English-speaking malewith history of hypertension,diabetes mellitus. Per chart review patient lives with sister was independent prior to admission.Plan is for sister and brother-in-law to provide assistance as needed at time of discharge.Presented 01/10/2017 fter being found down from a supposed fall. CT of the head and imaging revealed intracranial hemorrhage with a 4.5 cm right frontal intraparenchymal hemorrhage, 6 mm right subdural hematoma and other scattered areas of intraparenchymal and subarachnoid hemorrhage with right to left midline shift. Nondisplaced vertical fracture through the left temporal bone.CT cervical spine negative.CT abdomen and pelvis with no evidence of acute injury.UDS negative.neurosurgery Dr. Sherley Bounds advised conservative care with ICP monitor.follow-up ENT Dr. Redmond Baseman for temporal bone fracture no surgical intervention required.follow-up CT imaging showed increased right to left shift greater than 1 cm. Basal cisterns slightly effaced.Patient Withsteady rise in his ICP and not responsive to heavy sedation. .Underwent right frontal temporal parietal craniectomy for hematoma evacuation 01/14/2017 per Dr. Cyndy Freeze with placement of bone flap in the abdomen. Keppra for seizure prophylaxis.Patient was extubated 01/19/2017.Nasogastric tube in place for nutritional support patient remains NPO.Later placed on subcutaneous heparin for DVT prophylaxis. Placed on Ancef 01/18/2017 for suspected pneumonia 5 days.Bouts of urinary retention with Urecholine addedas well as greater than 100,000 Escherichia coli UTI placed on Cipro 01/28/2017. Physical therapy evaluation completed with recommendations of physical medicine rehabilitation consult.patient was admitted for a comprehensive rehabilitation program 02/01/2017. Patient had been maintained on Ritalin as well as Seroquel for mood stimulation and improve awareness. His voiding continued to improve and  Urecholine ongoing 10 mg 3 times a day. Blood  sugars monitored with hemoglobin A1c of 10.4 and Lantus insulin adjusted. He was ambulating up to 200 feet with max verbal cues for sequencing. He was able to communicate his needs with noted language barrier. Neurosurgery was consulted for plan cranioplasty and was discharged acute care services 03/19/2017 for cranioplasty using original bone flap from decompressive craniectomy which was retrieved from the abdomen per Dr. Sherley Bounds. Therapies have been resumed and patient is admitted back to inpatient rehabilitation services to continue ongoing comprehensive inpatient rehabilitation services to complete education and plan discharge.  Patient transferred to CIR on 03/22/2017 .   Patient currently requires min with mobility secondary to muscle weakness, decreased cardiorespiratoy endurance, motor apraxia, decreased coordination and decreased motor planning, decreased visual perceptual skills, decreased attention to left, decreased attention, decreased awareness, decreased problem solving, decreased safety awareness, decreased memory and delayed processing, and decreased standing balance, decreased postural control, hemiplegia and decreased balance strategies.  Prior to hospitalization, patient was independent  with mobility and lived with  (will d/c home with sister) in a House home.  Home access is  Level entry.  Patient will benefit from skilled PT intervention to maximize safe functional mobility, minimize fall risk and decrease caregiver burden for planned discharge home with 24 hour assist.  Anticipate patient will benefit from follow up [facility] at discharge.  PT - End of Session Activity Tolerance: Tolerates 30+ min activity with multiple rests Endurance Deficit: Yes Endurance Deficit Description: 2/2 fatigue PT Assessment Rehab Potential (ACUTE/IP ONLY): Good PT Patient demonstrates impairments in the following area(s):  Balance;Behavior;Endurance;Motor;Safety;Perception PT Transfers Functional Problem(s): Bed Mobility;Bed to Chair;Car;Furniture;Floor PT Locomotion Functional Problem(s): Ambulation;Stairs PT Plan PT Intensity: Minimum of 1-2 x/day ,45 to 90 minutes PT Frequency: 5 out of 7 days PT Duration Estimated Length of Stay: 1 week PT Treatment/Interventions: Ambulation/gait training;DME/adaptive equipment instruction;Neuromuscular re-education;Psychosocial support;Stair training;UE/LE Strength taining/ROM;UE/LE Coordination activities;Therapeutic Activities;Pain management;Discharge planning;Balance/vestibular training;Cognitive remediation/compensation;Disease management/prevention;Functional mobility training;Patient/family education;Therapeutic Exercise;Visual/perceptual remediation/compensation PT Transfers Anticipated Outcome(s): supervision overall PT Locomotion Anticipated Outcome(s): supervision/min assist  PT Recommendation Recommendations for Other Services: Speech consult;Therapeutic Recreation consult Therapeutic Recreation Interventions: Pet therapy Follow Up Recommendations: 24 hour supervision/assistance Patient destination: Home Equipment Recommended: To be determined  Skilled Therapeutic Intervention Treatment 1: Pt received in bed & agreeable to tx. Pt donned paper scrub pants & top with set up assist, but put pants on backwards. Session focused on gait (even & uneven surfaces), stair negotiation with L rail, transfers (sit<>stand, car) and bed mobility. Pt completes all functional mobility tasks at min assist level with cuing for safety and technique. No family present to discuss ELOS, POC, goals, etc. Pt requires rest breaks throughout session 2/2 fatigue. At end of session pt left sitting on EOB with RN present & consuming breakfast. No c/o pain reported.  Treatment 2: Pt received in room with sister [name]) present to observe session. Pt without c/o pain. Session focused on gait  for activity tolerance and stair negotiation. Pt ambulates throughout unit and to central unit & back without AD & supervision assist. Pt requires cuing for pathfinding throughout hospital. Pt negotiated 12 steps with L rail & min assist with cuing for safety. Pt requires cuing for technique as he will attempt to negotiate first two steps with RLE only and not advance LLE. Pt also requires LUE to be placed on therapist's shoulder when descending stairs to prevent pt from reaching for various things. Therapist asked pt's sister to assist with stair negotiation but she reports she cannot exert physical effort today  2/2 having a molar pulled this morning but she's agreeable to practicing tomorrow. At end of session pt left sitting on EOB with sister present to supervise.   PT Evaluation Precautions/Restrictions Precautions Precautions: Fall Restrictions Weight Bearing Restrictions: No  General Chart Reviewed: Yes Additional Pertinent History: HTN, DM, hx of craniectomy 01/14/17, cranioplasty 03/19/17 Response to Previous Treatment: Not applicable Family/Caregiver Present: No  Pain Pain Assessment Pain Assessment: No/denies pain  Home Living/Prior Functioning Home Living Available Help at Discharge: Family;Available 24 hours/day Type of Home: House Home Access: Level entry Home Layout: Two level;Bed/bath upstairs Alternate Level Stairs-Number of Steps: 12 Alternate Level Stairs-Rails: Left  Lives With:  (will d/c home with sister) Prior Function Level of Independence: Independent with basic ADLs;Independent with gait;Independent with homemaking with ambulation;Independent with transfers  Able to Take Stairs?: Yes Driving: Yes Vocation:  (working as a Development worker, international aid)  Vision/Perception  Geologist, engineering: Impaired (decreased attention to L side of body and to L environment) Praxis Praxis: Impaired Praxis Impairment Details: Motor planning (LUE/LLE apraxia)   Cognition Overall  Cognitive Status: Impaired/Different from baseline Arousal/Alertness: Awake/alert Orientation Level: Oriented to person;Oriented to place;Oriented to time ("hospital" and "[place]", also knows current month is "May" but reports it's the year "1918") Memory: Impaired Awareness: Impaired Awareness Impairment: Intellectual impairment Problem Solving: Impaired  Motor  Motor Motor:  (general weakness)   Mobility Bed Mobility Bed Mobility: Supine to Sit;Sit to Supine Supine to Sit: 5: Supervision Sit to Supine: 5: Supervision Transfers Transfers: Yes Sit to Stand: 4: Min assist  Locomotion  Ambulation Ambulation: Yes Ambulation/Gait Assistance: 4: Min assist Ambulation Distance (Feet): 150 Feet Assistive device: None Gait Gait: Yes Gait Pattern: Decreased step length - left;Decreased step length - right (wide BOS) Stairs / Additional Locomotion Stairs: Yes Stairs Assistance: 4: Min assist Stairs Assistance Details: Verbal cues for technique;Verbal cues for precautions/safety;Verbal cues for sequencing Stair Management Technique: One rail Left Number of Stairs: 12 Height of Stairs: 6 (inches) Ramp: 4: Min Administrator Mobility: No   Balance Balance Balance Assessed: Yes Dynamic Standing Balance Dynamic Standing - Balance Support: No upper extremity supported Dynamic Standing - Level of Assistance: 4: Min assist Dynamic Standing - Comments: during gait  Extremity Assessment  RLE Assessment RLE Assessment: Within Functional Limits LLE Assessment LLE Assessment: Within Functional Limits   See Function Navigator for Current Functional Status.   Refer to Care Plan for Long Term Goals  Recommendations for other services: Therapeutic Recreation  Pet therapy  Discharge Criteria: Patient will be discharged from PT if patient refuses treatment 3 consecutive times without medical reason, if treatment goals not met, if there is a change in  medical status, if patient makes no progress towards goals or if patient is discharged from hospital.  The above assessment, treatment plan, treatment alternatives and goals were discussed and mutually agreed upon: No family available/patient unable  [place] 03/23/2017, 4:25 PM

## 2017-03-23 NOTE — Evaluation (Signed)
Occupational Therapy Assessment and Plan  Patient Details  Name: [name] MRN: [id number] Date of Birth: [birth year]  OT Diagnosis: cognitive deficits, muscle weakness (generalized) and coordination disorder, hemiplegia Rehab Potential: Rehab Potential (ACUTE ONLY): Good ELOS: 3-5 days   Today's Date: 03/23/2017 OT Individual Time: 1000-1100 and 1300-1400 OT Individual Time Calculation (min): 60 min and 60 min   Problem List:  Patient Active Problem List   Diagnosis Date Noted  . SDH (subdural hematoma) ([place])   . Post-operative pain   . Agitation   . Attention and concentration deficit   . Seizure prophylaxis   . Dysphagia   . Urinary retention   . Type 2 diabetes mellitus with peripheral neuropathy (HCC)   . S/P craniotomy 03/19/2017  . Essential hypertension   . Agitated   . Lethargy   . Benign essential HTN   . Poorly controlled type 2 diabetes mellitus with peripheral neuropathy ([place])   . 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 Aphasia 02/01/2017  . Dysphagia, oropharyngeal 02/01/2017  . Intracerebral hemorrhage ([place]) 01/14/2017  . Fall 01/10/2017  . Subdural hemorrhage ([place]) 01/10/2017  . Diabetes type 2, uncontrolled ([place]) 06/13/2015    Past Medical History:  Past Medical History:  Diagnosis Date  . Diabetes mellitus without complication [facility])    Past Surgical History:  Past Surgical History:  Procedure Laterality Date  . CRANIOTOMY Right 01/13/2017   Procedure: 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, Placement of bone flap in abdomen;  Surgeon: Ashok Pall, MD;  Location: [place];  Service: Neurosurgery;  Laterality: Right;  . CRANIOTOMY Right 03/19/2017   Procedure: Cranioplasty with placement of bone flap from abdomen;  Surgeon: Eustace Moore, MD;  Location: [place];  Service: Neurosurgery;  Laterality: Right;    Assessment & Plan Clinical Impression: Patient  is a 51 y.o. year old malewith history of hypertension,diabetes mellitus. Per chart review patient lives with sister was independent prior to admission.Plan is for sister and brother-in-law to provide assistance as needed at time of discharge.Presented 01/10/2017 fter being found down from a supposed fall. CT of the head and imaging revealed intracranial hemorrhage with a 4.5 cm right frontal intraparenchymal hemorrhage, 6 mm right subdural hematoma and other scattered areas of intraparenchymal and subarachnoid hemorrhage with right to left midline shift. Nondisplaced vertical fracture through the left temporal bone.CT cervical spine negative.CT abdomen and pelvis with no evidence of acute injury.UDS negative.neurosurgery Dr. Sherley Bounds advised conservative care with ICP monitor.follow-up ENT Dr. Redmond Baseman for temporal bone fracture no surgical intervention required.follow-up CT imaging showed increased right to left shift greater than 1 cm. Basal cisterns slightly effaced.Patient Withsteady rise in his ICP and not responsive to heavy sedation. .Underwent right frontal temporal parietal craniectomy for hematoma evacuation 01/14/2017 per Dr. Cyndy Freeze with placement of bone flap in the abdomen. Keppra for seizure prophylaxis.Patient was extubated 01/19/2017.Nasogastric tube in place for nutritional support patient remains NPO.Later placed on subcutaneous heparin for DVT prophylaxis. Placed on Ancef 01/18/2017 for suspected pneumonia 5 days.Bouts of urinary retention with Urecholine addedas well as greater than 100,000 Escherichia coli UTI placed on Cipro 01/28/2017. Physical therapy evaluation completed with recommendations of physical medicine rehabilitation consult.patient was admitted for a comprehensive rehabilitation program 02/01/2017. Patient had been maintained on Ritalin as well as Seroquel for mood stimulation and improve awareness. His voiding continued to improve and Urecholine ongoing 10 mg 3 times a  day. Blood sugars monitored  with hemoglobin A1c of 10.4 and Lantus insulin adjusted. He was ambulating up to 200 feet with max verbal cues for sequencing. He was able to communicate his needs with noted language barrier. Neurosurgery was consulted for plan cranioplasty and was discharged acute care services 03/19/2017 for cranioplasty using original bone flap from decompressive craniectomy which was retrieved from the abdomen per Dr. Sherley Bounds.  Patient transferred to CIR on 03/22/2017 .    Patient currently requires min with basic self-care skills secondary to muscle weakness, decreased cardiorespiratoy endurance, decreased initiation, decreased attention, decreased awareness, decreased problem solving, decreased safety awareness and delayed processing and decreased sitting balance, decreased standing balance, hemiplegia and decreased balance strategies.  Prior to hospitalization, patient could complete ADLs with independent .  Patient will benefit from skilled intervention to decrease level of assist with basic self-care skills prior to discharge home with care partner.  Anticipate patient will require 24 hour supervision and minimal physical assistance and follow up home health.  OT - End of Session Activity Tolerance: Decreased this session Endurance Deficit: Yes Endurance Deficit Description: 2/2 fatigue OT Assessment Rehab Potential (ACUTE ONLY): Good OT Patient demonstrates impairments in the following area(s): Balance;Behavior;Cognition;Endurance;Pain;Safety OT Basic ADL's Functional Problem(s): Grooming;Bathing;Dressing;Toileting OT Transfers Functional Problem(s): Toilet;Tub/Shower OT Additional Impairment(s): None OT Plan OT Intensity: Minimum of 1-2 x/day, 45 to 90 minutes OT Frequency: 5 out of 7 days OT Duration/Estimated Length of Stay: 3-5 days OT Treatment/Interventions: Balance/vestibular training;Cognitive remediation/compensation;Community reintegration;Discharge  planning;Functional mobility training;Psychosocial support;Therapeutic Activities;UE/LE Coordination activities;Patient/family education;DME/adaptive equipment instruction;UE/LE Strength taining/ROM;Wheelchair propulsion/positioning;Therapeutic Exercise;Self Care/advanced ADL retraining;Neuromuscular re-education OT Recommendation Recommendations for Other Services:  (none at this time) Patient destination: Home Follow Up Recommendations: Home health OT Equipment Recommended: Tub/shower bench   Skilled Therapeutic Intervention Session 1: Upon entering the room, pt supine in bed with no c/o,signs, or symptoms of pain. Pt agreeable to OT intervention. Pt declined bathing and dressing and states, " I am going to do that when I get home." Pt oriented to self and month only this session. Pt with bouts of confabulation this session regarding why he is currently in hospital. Pt standing at sink to brush teeth with set up A for task. Pt ambulating without device with steady assistance to ADL apartment for shower transfer onto TTB. OT providing demonstration and pt returning with steady assistance. Pt performed 2 flights of stairs this session with overall min A and mod multimodal cues for technique. Pt having difficulty switching task from upstairs to downstairs but able to be redirected with mod cues. Pt returning to room and vale bed as SLP arrived for next session.   Session 2: Upon entering the room, pt supine in bed awaiting therapist. Caregiver also arrived this session for continued education and d/c planning. OT educating pt and caregiver on OT purpose, POC, and goals. Pt ambulating with overall supervision from room >north tower main entrance 300'+ with overall supervision and sister providing cues for safety. Pt taking seated rest break outside and OT continued to discuss d/c plan and equipment for home environment. Pt requires max cues for orientation this session as he is insistent that he is  currently in [place]. Pt ambulating on uneven surface outside with CGA. Pt returning to room in same manner. Pt returned to bed with it secured and sister present in room. Call bell and all needed items within reach.   OT Evaluation Precautions/Restrictions  Precautions Precautions: Fall Restrictions Weight Bearing Restrictions: No Vital Signs Therapy Vitals Temp: 98.6 F (37 C) Temp  Source: Oral Pulse Rate: 100 Resp: 18 BP: 114/73 Patient Position (if appropriate): Lying Oxygen Therapy SpO2: 100 % O2 Device: Not Delivered Pain Pain Assessment Pain Assessment: No/denies pain Pain Score: 0-No pain Faces Pain Scale: No hurt PAINAD (Pain Assessment in Advanced Dementia) Breathing: normal Facial Expression: smiling or inexpressive Body Language: relaxed Consolability: no need to console Home Living/Prior Functioning Home Living Available Help at Discharge: Family, Available 24 hours/day Type of Home: House Home Access: Level entry Home Layout: Two level, Bed/bath upstairs Alternate Level Stairs-Number of Steps: 12 Alternate Level Stairs-Rails: Left Bathroom Shower/Tub: Chiropodist: Standard  Lives With:  (d/c home with sister) Prior Function Level of Independence: Independent with basic ADLs, Independent with gait, Independent with homemaking with ambulation, Independent with transfers  Able to Take Stairs?: Yes Driving: Yes Vocation:  (working as a Development worker, international aid) Estate agent?: Vision impaired- to be further tested in functional context Perception  Perception: Impaired (decreased attention to L side of body and to L environment) Praxis Praxis: Impaired Praxis Impairment Details: Motor planning (LUE/LLE apraxia) Cognition Overall Cognitive Status: Impaired/Different from baseline Arousal/Alertness: Awake/alert Orientation Level: Person Year: Other (Comment) (1919) Month: May Day of Week: Incorrect (sat) Memory: Impaired Memory  Impairment: Decreased recall of new information;Decreased short term memory Decreased Short Term Memory: Verbal basic;Functional basic Immediate Memory Recall: Sock;Blue;Bed Memory Recall: Sock (1/3) Memory Recall Sock: Without Cue Attention: Selective Selective Attention: Impaired Selective Attention Impairment: Verbal basic;Functional basic Awareness: Impaired Awareness Impairment: Intellectual impairment Problem Solving: Impaired Problem Solving Impairment: Functional basic;Verbal basic Behaviors: Impulsive Safety/Judgment: Impaired Rancho Los Amigos Scales of Cognitive Functioning: Confused/appropriate Motor  Motor Motor: Other (comment) Motor - Skilled Clinical Observations: generalized weakness Mobility  Bed Mobility Bed Mobility: Supine to Sit;Sit to Supine Supine to Sit: 5: Supervision Sit to Supine: 5: Supervision Transfers Sit to Stand: 4: Min assist   Balance Balance Balance Assessed: Yes Dynamic Standing Balance Dynamic Standing - Balance Support: No upper extremity supported Dynamic Standing - Level of Assistance: 4: Min assist Dynamic Standing - Comments: during gait Extremity/Trunk Assessment RUE Assessment RUE Assessment: Within Functional Limits LUE Assessment LUE Assessment: Exceptions to [facility] (3-/5 throughout. Decreased awareness to L side)   See Function Navigator for Current Functional Status.   Refer to Care Plan for Long Term Goals  Recommendations for other services: None    Discharge Criteria: Patient will be discharged from OT if patient refuses treatment 3 consecutive times without medical reason, if treatment goals not met, if there is a change in medical status, if patient makes no progress towards goals or if patient is discharged from hospital.  The above assessment, treatment plan, treatment alternatives and goals were discussed and mutually agreed upon: by patient and by family  Gypsy Dec[place] 03/23/2017, 5:02 PM

## 2017-03-23 NOTE — Evaluation (Signed)
Speech Language Pathology Assessment and Plan  Patient Details  Name: [name] MRN: [id number] Date of Birth: September 02, 1966  SLP Diagnosis: Cognitive Impairments;Dysphagia  Rehab Potential: Good ELOS: 1 week     Today's Date: 03/23/2017 SLP Individual Time: 1100-1200 SLP Individual Time Calculation (min): 60 min   Problem List:  Patient Active Problem List   Diagnosis Date Noted  . SDH (subdural hematoma) ([place])   . Post-operative pain   . Agitation   . Attention and concentration deficit   . Seizure prophylaxis   . Dysphagia   . Urinary retention   . Type 2 diabetes mellitus with peripheral neuropathy (HCC)   . S/P craniotomy 03/19/2017  . Essential hypertension   . Agitated   . Lethargy   . Benign essential HTN   . Poorly controlled type 2 diabetes mellitus with peripheral neuropathy ([place])   . 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 Aphasia 02/01/2017  . Dysphagia, oropharyngeal 02/01/2017  . Intracerebral hemorrhage ([place]) 01/14/2017  . Fall 01/10/2017  . Subdural hemorrhage ([place]) 01/10/2017  . Diabetes type 2, uncontrolled ([place]) 06/13/2015   Past Medical History:  Past Medical History:  Diagnosis Date  . Diabetes mellitus without complication [facility])    Past Surgical History:  Past Surgical History:  Procedure Laterality Date  . CRANIOTOMY Right 01/13/2017   Procedure: 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, Placement of bone flap in abdomen;  Surgeon: Ashok Pall, MD;  Location: [facility];  Service: Neurosurgery;  Laterality: Right;  . CRANIOTOMY Right 03/19/2017   Procedure: Cranioplasty with placement of bone flap from abdomen;  Surgeon: Eustace Moore, MD;  Location: [facility];  Service: Neurosurgery;  Laterality: Right;    Assessment / Plan / Recommendation Clinical Impression Patient is a 51 y.o.right handedlimited English-speaking malewith history of  hypertension,diabetes mellitus. Per chart review patient lives with sister was independent prior to admission.Plan is for sister and brother-in-law to provide assistance as needed at time of discharge.Presented 01/10/2017 fter being found down from a supposed fall. CT of the head and imaging revealed intracranial hemorrhage with a 4.5 cm right frontal intraparenchymal hemorrhage, 6 mm right subdural hematoma and other scattered areas of intraparenchymal and subarachnoid hemorrhage with right to left midline shift. Nondisplaced vertical fracture through the left temporal bone.CT cervical spine negative.CT abdomen and pelvis with no evidence of acute injury.UDS negative.neurosurgery Dr. Sherley Bounds advised conservative care with ICP monitor.follow-up ENT Dr. Redmond Baseman for temporal bone fracture no surgical intervention required.follow-up CT imaging showed increased right to left shift greater than 1 cm. Basal cisterns slightly effaced.Patient Withsteady rise in his ICP and not responsive to heavy sedation. .Underwent right frontal temporal parietal craniectomy for hematoma evacuation 01/14/2017 per Dr. Cyndy Freeze with placement of bone flap in the abdomen. Keppra for seizure prophylaxis.Patient was extubated 01/19/2017.Nasogastric tube in place for nutritional support patient remains NPO.Later placed on subcutaneous heparin for DVT prophylaxis. Placed on Ancef 01/18/2017 for suspected pneumonia 5 days.Bouts of urinary retention with Urecholine addedas well as greater than 100,000 Escherichia coli UTI placed on Cipro 01/28/2017. Physical therapy evaluation completed with recommendations of physical medicine rehabilitation consult.patient was admitted for a comprehensive rehabilitation program 02/01/2017. Patient had been maintained on Ritalin as well as Seroquel for mood stimulation and improve awareness. His voiding continued to improve and Urecholine ongoing 10 mg 3 times a day. Blood sugars monitored with hemoglobin  A1c of 10.4 and Lantus insulin adjusted. He was  ambulating up to 200 feet with max verbal cues for sequencing. He was able to communicate his needs with noted language barrier. Neurosurgery was consulted for plan cranioplasty and was discharged acute care services 03/19/2017 for cranioplasty using original bone flap from decompressive craniectomy which was retrieved from the abdomen per Dr. Marikay Alar. Therapies have been resumed and patient is admitted back to inpatient rehabilitation services to continue ongoing comprehensive inpatient rehabilitation services to complete education and plan discharge.  Patient demonstrates behaviors consistent with a Rancho Level VI-VII and requires overall Mod-Max A verbal cues to complete functional and familiar tasks safely in regards to intellectual awareness, problem solving, recall and attention. Patient also consumed thin liquids via cup and straw without overt s/s of aspiration and demonstrated intermittent oral holding of liquids and Dys. 2 textures. However, patient did not demonstrate any overt s/s of aspiration. Recommend patient continue current diet with Dys. 2 textures with thin liquids and full supervision for use of swallowing compensatory strategies. Patient would benefit from skilled SLP intervention to maximize his cognitive and swallowing function and overall functional independence prior to discharge.    Skilled Therapeutic Interventions          Administered a cognitive-linguistic evaluation and BSE. Please see above for details.   SLP Assessment  Patient will need skilled Speech Lanaguage Pathology Services during CIR admission    Recommendations  SLP Diet Recommendations: Dysphagia 2 (Fine chop);Thin Liquid Administration via: Cup;Straw Medication Administration: Crushed with puree Supervision: Patient able to self feed;Full supervision/cueing for compensatory strategies Compensations: Minimize environmental distractions;Slow rate;Small  sips/bites Postural Changes and/or Swallow Maneuvers: Seated upright 90 degrees Oral Care Recommendations: Oral care BID Recommendations for Other Services: Neuropsych consult Patient destination: Home Follow up Recommendations: Home Health SLP;24 hour supervision/assistance Equipment Recommended: None recommended by SLP    SLP Frequency 3 to 5 out of 7 days   SLP Duration  SLP Intensity  SLP Treatment/Interventions 1 week   Minumum of 1-2 x/day, 30 to 90 minutes  Cognitive remediation/compensation;Cueing hierarchy;Environmental controls;Functional tasks;Dysphagia/aspiration precaution training;Internal/external aids;Patient/family education;Therapeutic Activities    Pain No/Denies Pain   Function:  Eating Eating   Modified Consistency Diet: Yes Eating Assist Level: More than reasonable amount of time;Supervision or verbal cues           Cognition Comprehension Comprehension assist level: Understands basic 75 - 89% of the time/ requires cueing 10 - 24% of the time  Expression   Expression assist level: Expresses basic 50 - 74% of the time/requires cueing 25 - 49% of the time. Needs to repeat parts of sentences.  Social Interaction Social Interaction assist level: Interacts appropriately 50 - 74% of the time - May be physically or verbally inappropriate.  Problem Solving Problem solving assist level: Solves basic 50 - 74% of the time/requires cueing 25 - 49% of the time  Memory Memory assist level: Recognizes or recalls 25 - 49% of the time/requires cueing 50 - 75% of the time   Short Term Goals: Week 1: SLP Short Term Goal 1 (Week 1): STGs=LTGs  Refer to Care Plan for Long Term Goals  Recommendations for other services: None   Discharge Criteria: Patient will be discharged from SLP if patient refuses treatment 3 consecutive times without medical reason, if treatment goals not met, if there is a change in medical status, if patient makes no progress towards goals or if  patient is discharged from hospital.  The above assessment, treatment plan, treatment alternatives and goals were discussed and mutually  agreed upon: by patient  Cole Klugh 03/23/2017, 3:41 PM

## 2017-03-24 ENCOUNTER — Inpatient Hospital Stay (HOSPITAL_COMMUNITY): Payer: Self-pay | Admitting: Occupational Therapy

## 2017-03-24 ENCOUNTER — Inpatient Hospital Stay (HOSPITAL_COMMUNITY): Payer: Self-pay

## 2017-03-24 ENCOUNTER — Inpatient Hospital Stay (HOSPITAL_COMMUNITY): Payer: Self-pay | Admitting: Physical Therapy

## 2017-03-24 ENCOUNTER — Inpatient Hospital Stay (HOSPITAL_COMMUNITY): Payer: Self-pay | Admitting: Speech Pathology

## 2017-03-24 DIAGNOSIS — S065X4S Traumatic subdural hemorrhage with loss of consciousness of 6 hours to 24 hours, sequela: Secondary | ICD-10-CM

## 2017-03-24 DIAGNOSIS — N390 Urinary tract infection, site not specified: Secondary | ICD-10-CM

## 2017-03-24 DIAGNOSIS — A499 Bacterial infection, unspecified: Secondary | ICD-10-CM

## 2017-03-24 LAB — GLUCOSE, CAPILLARY
GLUCOSE-CAPILLARY: 160 mg/dL — AB (ref 65–99)
GLUCOSE-CAPILLARY: 99 mg/dL (ref 65–99)
GLUCOSE-CAPILLARY: 102 mg/dL — AB (ref 65–99)
Glucose-Capillary: 126 mg/dL — ABNORMAL HIGH (ref 65–99)
Glucose-Capillary: 126 mg/dL — ABNORMAL HIGH (ref 65–99)

## 2017-03-24 MED ORDER — QUETIAPINE FUMARATE 25 MG PO TABS
12.5000 mg | ORAL_TABLET | Freq: Every evening | ORAL | Status: DC | PRN
  Administered 2017-03-24 – 2017-03-25 (×2): 12.5 mg via ORAL
  Filled 2017-03-24 (×2): qty 1

## 2017-03-24 MED ORDER — METHYLPHENIDATE HCL 5 MG PO TABS
5.0000 mg | ORAL_TABLET | Freq: Two times a day (BID) | ORAL | Status: DC
Start: 2017-03-24 — End: 2017-03-27
  Administered 2017-03-24 – 2017-03-27 (×7): 5 mg via ORAL
  Filled 2017-03-24 (×7): qty 1

## 2017-03-24 NOTE — Progress Notes (Signed)
Social Work Patient ID: [name], male   DOB: January 05, 1966, 51 y.o.   MRN: [id number]   Have attached initial assessment after review with sister and no changes needed.  Plan still for pt to d/c home with sister providing 24/7 supervision.       Anselm Pancoast, [place] Social Worker Signed   Progress Notes Date of Service: 02/04/2017 4:13 PM      [] Hide copied text [] Hover for attribution information Social Work  Social Work Assessment and Plan  Patient Details  Name: [name] MRN: [id number] Date of Birth: 1966/05/25  Today's Date: 02/04/2017  Problem List:      Patient Active Problem List   Diagnosis Date Noted  . Traumatic intraparenchymal hemorrhage (HCC) 02/01/2017  . Aphasia 02/01/2017  . Dysphagia, oropharyngeal 02/01/2017  . Intracerebral hemorrhage (HCC) 01/14/2017  . Fall 01/10/2017  . Subdural hemorrhage (HCC) 01/10/2017  . Diabetes type 2, uncontrolled (HCC) 06/13/2015   Past Medical History:      Past Medical History:  Diagnosis Date  . Diabetes mellitus without complication [facility])    Past Surgical History:       Past Surgical History:  Procedure Laterality Date  . CRANIOTOMY Right 01/13/2017   Procedure: 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, Placement of bone flap in abdomen;  Surgeon: Coletta Memos, MD;  Location[facility];  Service: Neurosurgery;  Laterality: Right;   Social History:  reports that he has never smoked. He has never used smokeless tobacco. He reports that he drinks alcohol. He reports that he does not use drugs.  Family / Support Systems Marital Status: Single Patient Roles: Other (Comment), Parent (has a sister and brother-in-law) Children: Pt does have an adult daughter who lives in [place].   Other Supports: Mother and other extended family in [place].  Primary support:  sister, [name] @ ([phone] and her spouse. Anticipated Caregiver: sister and  family Ability/Limitations of Caregiver: Sister plans to be caregiver, her husband will assist as able. Caregiver Availability: 24/7 Family Dynamics: Sister and brother-in-law are extremely supportive and encouraging.  Sister tearful as she spoke of their mutual support of one another and states, "This has just turned our lives upside down."  Social History Preferred language: English Religion:  Cultural Background: Pt originally from [place].  [place]oved [place] 20 [place] ago but continues to return to [place] to see family every few years. 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 Guardian/Conservator: None - per MD, pt is not capable of making decisions on his own behalf - defer to sister.   Abuse/Neglect Physical Abuse: Denies Verbal Abuse: Denies Sexual Abuse: Denies Exploitation of patient/patient's resources: Denies Self-Neglect: Denies  Emotional Status Pt's affect, behavior adn adjustment status: Pt with significant cognitive deficits and is not verbal with me despite encouragement.  At one point, he and sister were on "face time" with their mother in [place] but still could not get pt to speak with me about that.  Does not appear to be in any emotional distress but will monitor and refer for neuropsychology as indicated. Recent Psychosocial Issues: None Pyschiatric History: None Substance Abuse History: none  Patient / Family Perceptions, Expectations & Goals Pt/Family understanding of illness & functional limitations: Cannot assess how much pt understands about his  situation.  Sister with good, basic understanding of his TBI and current functional limitations/ need for CIR.  Education will need to be ongoing with family as they prepare for d/c. Premorbid  pt/family roles/activities: Pt was working f/t, living alone and completely independent. Anticipated changes in roles/activities/participation: Anticipate need for 24/7 assistance.  Sister prepared to assume primary caregiver role. Pt/family expectations/goals: Sister very tearful as she reports she "just hope he can get better from this."  [employer] Community Agencies: None Premorbid Home Care/DME Agencies: None Transportation available at discharge: yes Resource referrals recommended: Neuropsychology, Support group (specify)  Discharge Planning Living Arrangements: Alone Support Systems: Other relatives Type of Residence: Private residence Insurance Resources: Customer service managerelf-pay Financial Resources: Employment Financial Screen Referred: Yes Living Expenses: Psychologist, sport and exerciseent Money Management: Patient Does the patient have any problems obtaining your medications?: Yes (Describe) (no insurance) Home Management: Pt was independent Patient/Family Preliminary Plans: Pt to d/c home with sister and her family. Social Work Anticipated Follow Up Needs: HH/OP, Support Group Expected length of stay: 4 weeks  Clinical Impression Very unfortunate gentleman found down at his home and had suffered a severe TBI.  Continues with significant deficits and decreased arousal.  Sister very supportive and assists with completing assessment interview.  Cannot assess emotional status at this point but will monitor as he clears cognitively.  Family prepared to proide 24/7 assist at sister's home (she does not work).  Will follow for ongoing support, education and d/c planning needs.  Jachai Okazaki 02/04/2017, 4:13 PM

## 2017-03-24 NOTE — Progress Notes (Signed)
Occupational Therapy Session Note  Patient Details  Name: Jamie BeckwithMiguel Garcia Cardenas MRN: [id number] Date of Birth: 21-Feb-1966  Today's Date: 03/24/2017 OT Individual Time: 1002-1100 and 1400-1430 OT Individual Time Calculation (min): 58 min and 30  Min    Short Term Goals: Week 1:  OT Short Term Goal 1 (Week 1): STGs=LTGs secondary to short estimated LOS  Skilled Therapeutic Interventions/Progress Updates:  Session 1: Upon entering the room, pt supine in bed sleeping soundly upon entering the room with interpreter present. Pt having difficulty waking up and need max multimodal cues to initiate supine>sit with supervision. Pt ambulates to bathroom with close supervision for toileting. Pt transferred to sit on TTB with shower cap placed over head. Pt did not attempt to remove shower cap while bathing. Pt completed bathing tasks with min verbal cues for sequencing and close supervision while standing to wash buttocks. Pt exited bathroom to don clothing items from seated position. Pt donning shirt backwards and unaware. Pt taking multiple attempts to tie shoe laces this session but able to coordinate B hands to complete task. Pt returning to bed at end of session with bed lowered and bed alarm on.   Session 2: Pt received in day room in wheelchair and transitioned easily from SLP session. Caregiver present for continued family education. Pt with no c/o,signs, or symptoms of pain this session. OT providing education to caregiver regarding utilizing her body's position as tool to redirect pt in desired direction. Caregiver providing supervision and pt placing his L UE on her shoulder for guidance. Pt ambulating up and down flight of stairs with caregiver providing min assist and cues for safety. Pt taking rest break and performing task again with caregiver providing direction. She states, " I feel so much better now that I know I can do it." Caregiver provided supervision to pt back to room. Pt returned to  bed. Bed lowered to floor, bed rails up, and bed alarm activated. All needs within reach.   Therapy Documentation Precautions:  Precautions Precautions: Fall Restrictions Weight Bearing Restrictions: No General:   Vital Signs: Therapy Vitals Temp: 97.8 F (36.6 C) Temp Source: Oral Pulse Rate: 99 Resp: 20 BP: 109/73 Patient Position (if appropriate): Sitting Oxygen Therapy SpO2: 100 % O2 Device: Not Delivered  See Function Navigator for Current Functional Status.   Therapy/Group: Individual Therapy  [name], Cristo Ausburn P 03/24/2017, 4:37 PM

## 2017-03-24 NOTE — Patient Care Conference (Signed)
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 Post-operative pain   . Agitation   . Attention and concentration deficit   . Seizure prophylaxis   . Dysphagia   . Urinary retention   . Type 2 diabetes mellitus with peripheral neuropathy (HCC)   . S/P craniotomy 03/19/2017  . Essential hypertension   . Agitated   . Lethargy   . Benign essential HTN   . Poorly controlled type 2 diabetes mellitus with peripheral neuropathy (HCC)   . 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 Aphasia 02/01/2017  . Dysphagia, oropharyngeal 02/01/2017  . Intracerebral hemorrhage (HCC) 01/14/2017  . Fall 01/10/2017  . Subdural hemorrhage (HCC) 01/10/2017  . Diabetes type 2, uncontrolled (HCC) 06/13/2015    Expected Discharge Date: Expected Discharge Date: 03/27/17  Team Members Present: Physician leading conference: Dr. Faith RogueZachary Swartz Social Worker Present: Amada JupiterLucy Kaius Daino, LCSW Nurse Present: [name] DoomAshley Royal, RN PT Present: Aleda GranaVictoria Miller, PT OT Present: Callie FieldingKatie Pittman, OT SLP Present: Feliberto Gottronourtney Payne, SLP PPS Coordinator present : Edson SnowballBecky Windsor, PT     Current Status/Progress Goal Weekly Team Focus  Medical   back from cranioplasty. out of [place] bed  stabilize medically and behaviorally for discharge  behavioral mod, liberate  from enclosure bed   Bowel/Bladder   continent  to remain continent      Swallow/Nutrition/ Hydration   Dys. 2 textures with thin liquids, Min-Mod A  Min A  tolerance of current diet, use of compensatory strategies    ADL's   one person assist with ques.  Minimal assistance with ADLs  educating, cognition and self care   Mobility   Minimal assistance  Minimal assistance  Educating on gait and balance   Communication             Safety/Cognition/ Behavioral Observations  Max A  Max A  attention, orientation, awereness   Pain             Skin                *See Care Plan and progress notes for long and short-term goals.  Barriers to Discharge: behavior, decreased awareness,     Possible Resolutions to Barriers:  family ed, simplify medications    Discharge Planning/Teaching Needs:  DC home with sister  Teaching to be completed this week.   Team Discussion:  Returning to CIR after flap surgery and doing very well!  Cognition and mobility unchanged from status at tf and some improvements noted.  Team feels pt ready for [facility]  Revisions to Treatment Plan:  None   Continued Need for Acute Rehabilitation Level of Care: The patient requires daily medical management by a physician with specialized  training in physical medicine and rehabilitation for the following conditions: Daily direction of a multidisciplinary physical rehabilitation program to ensure safe treatment while eliciting the highest outcome that is of practical value to the patient.: Yes Daily medical management of patient stability for increased activity during participation in an intensive rehabilitation regime.: Yes Daily analysis of laboratory values and/or radiology reports with any subsequent need for medication adjustment of medical intervention for : Wound care problems;Neurological problems;Mood/behavior problems  Nash Bolls 03/25/2017, 3:15 PM

## 2017-03-24 NOTE — Progress Notes (Signed)
[place] PHYSICAL MEDICINE & REHABILITATION     PROGRESS NOTE    Subjective/Complaints: Seems to have slept well. Has minimal pain.   ROS: Limited due cognitive/behavioral   Objective: Vital Signs: Blood pressure 114/74, pulse 86, temperature 98 F (36.7 C), temperature source Oral, resp. rate 16, height 5\' 2"  (1.575 m), weight 60.3 kg (132 lb 15 oz), SpO2 99 %. Dg Chest 2 View  Result Date: 03/22/2017 CLINICAL DATA:  Possible sepsis. Acute onset of fever. Initial encounter. EXAM: CHEST  2 VIEW COMPARISON:  Chest radiograph performed 01/17/2017 FINDINGS: The lungs are well-aerated and clear. There is no evidence of focal opacification, pleural effusion or pneumothorax. The heart is normal in size; the mediastinal contour is within normal limits. No acute osseous abnormalities are seen. IMPRESSION: No acute cardiopulmonary process seen. Electronically Signed   By: Roanna Raider M.D.   On: 03/22/2017 20:19    Recent Labs  03/23/17 0550  WBC 4.9  HGB 10.2*  HCT 30.0*  PLT 236    Recent Labs  03/23/17 0550  NA 139  K 3.9  CL 103  GLUCOSE 159*  BUN 14  CREATININE 0.66  CALCIUM 9.0   CBG (last 3)   Recent Labs  03/23/17 1201 03/23/17 1711 03/24/17 0620  GLUCAP 184* 142* 126*    Wt Readings from Last 3 Encounters:  03/22/17 60.3 kg (132 lb 15 oz)  03/22/17 60.3 kg (133 lb)  07/31/16 68 kg (150 lb)    Physical Exam:  Constitutional: He appears well-developed and well-nourished.  HENT:  Cranioplasty site clean and dry with staples intact, non-tender Eyes: EOM are normal. Right eye exhibits no discharge. Left eye exhibits no discharge.  Pupils round and reactive to light  Neck: Normal range of motion. Neck supple. No thyromegaly present.  Cardiovascular: Normal rate, regular rhythm and normal heart sounds.   Respiratory: Effort normal and breath sounds normal.  GI: Soft. Bowel sounds are normal. He exhibits no distension.  Musculoskeletal: He exhibits no  edema or tenderness.  Neurological: He is alert.  Mtoor: 4+/5 throughout all 4, doesn't initiate as much with left lower extremity A&Ox1. Follows simple commands. Attention improved Skin: Skin is warm and dry.  Psychiatric: pleasant and cooperative   Assessment/Plan: 1. Cognitive and functional deficits secondary to TBI which require 3+ hours per day of interdisciplinary therapy in a comprehensive inpatient rehab setting. Physiatrist is providing close team supervision and 24 hour management of active medical problems listed below. Physiatrist and rehab team continue to assess barriers to discharge/monitor patient progress toward functional and medical goals.  Function:  Bathing Bathing position Bathing activity did not occur: Refused    Bathing parts      Bathing assist        Upper Body Dressing/Undressing Upper body dressing Upper body dressing/undressing activity did not occur: Refused       Pull over shirt/dress - Perfomed by patient: Put head through opening          Upper body assist Assist Level: Supervision or verbal cues      Lower Body Dressing/Undressing Lower body dressing   What is the patient wearing?: Non-skid slipper socks         Non-skid slipper socks- Performed by patient: Don/doff right sock, Don/doff left sock                    Lower body assist Assist for lower body dressing: Touching or steadying assistance (Pt > 75%)  Toileting Toileting   Toileting steps completed by patient: Adjust clothing prior to toileting, Performs perineal hygiene, Adjust clothing after toileting Toileting steps completed by helper: Adjust clothing prior to toileting, Performs perineal hygiene, Adjust clothing after toileting    Toileting assist Assist level: Touching or steadying assistance (Pt.75%)   Transfers Chair/bed transfer   Chair/bed transfer method: Ambulatory Chair/bed transfer assist level: Supervision or verbal cues        Locomotion Ambulation     Max distance: >500 ft Assist level: Supervision or verbal cues   Wheelchair Wheelchair activity did not occur: N/A        Cognition Comprehension Comprehension assist level: Understands basic 75 - 89% of the time/ requires cueing 10 - 24% of the time  Expression Expression assist level: Expresses basic 50 - 74% of the time/requires cueing 25 - 49% of the time. Needs to repeat parts of sentences.  Social Interaction Social Interaction assist level: Interacts appropriately 50 - 74% of the time - May be physically or verbally inappropriate.  Problem Solving Problem solving assist level: Solves basic 50 - 74% of the time/requires cueing 25 - 49% of the time  Memory Memory assist level: Recognizes or recalls 25 - 49% of the time/requires cueing 50 - 75% of the time   Medical Problem List and Plan: 1.  Decreased functional mobility secondary to right intraparenchymal hemorrhage-SDH-temporal bone fracture after fall. Status post right frontotemporoparietal craniectomy 01/14/2017 with cranioplasty 03/19/2017  -ELOS 5/25  -continue enclosure bed 2.  DVT Prophylaxis/Anticoagulation: SCDs. Patient is ambulatory 3. Pain Management: Hycet when necessary 4. Mood: Resume Ritalin 10 mg twice a day, Seroquel 12.5 mg daily at bedtime 5. Neuropsych: This patient is capable of making decisions on not own behalf. 6. Skin/Wound Care: Routine skin checks 7. Fluids/Electrolytes/Nutrition: Routine I&O with follow-up chemistries 8. Seizure prophylaxis. Keppra 750 mg twice a day. 9. Dysphagia. Dysphagia #2 FEN liquids. Follow-up speech therapy 10. Urinary retention/Escherichia coli UTI. Urecholine 10 mg 3 times a day as he's still having some retention  - F/U ucx negative--dc abx 11. Diabetes mellitus peripheral neuropathy. Hemoglobin A1c 10.4. Glucotrol 5 mg twice a day,  -  Resumed Glucophage at 500 mg daily(twice daily as prior to admission)  -dc lantus today  -sugars under  control 12. Hypertension. Patient on lisinopril 2.5 mg daily prior to admission. Resume as needed    LOS (Days) 2 A FACE TO FACE EVALUATION WAS PERFORMED  Ranelle OysterSWARTZ,Theodore Rahrig T, MD 03/24/2017 8:52 AM

## 2017-03-24 NOTE — Progress Notes (Signed)
Social Work Patient ID: [name], male   DOB: 01/04/1966, 50 y.o.   MRN: [id number]   Met with pt and sister yesterday afternoon and today (with interpreter present).  Both understand and are agreeable target d/c date set for Sat 5/26.  Sister completing education this week.  Have discussed referrals to be made for home support.  Continue to follow.  , , LCSW 

## 2017-03-24 NOTE — Progress Notes (Signed)
Physical Therapy Note  Patient Details  Name: Jamie BeckwithMiguel Garcia Berg MRN: [id number] Date of Birth: 1966-01-28 Today's Date: 03/24/2017  1100-1200, 60 min individual tx Pain: no c/o  Gait to gym with min hand hold assistance.  Leadihng questions for route finding. NuStep for neuro re-ed for alternating reciprocal movement x 4 extremities x 8 minutes.  Floor transfer to mat x 2, with mod cues, min assist for atypical, early standing, then walking feet forward to sit, but safe.  neuromuscular re-education via forced use, multimodal cues for bil hand task in standing; holding papers in L hand, using scissors and stapler in R with assistanc, and turning over and sequncing numbered disks with forced use of L hand, min assist for sequncing.  Also, in standing, using gloves on bil hands, pt cleaned disks with cleaning wipe, mod cues to attend to bil sides.  Advanced gait to return to room, kicking Yoga block with either foot, mod assista and mod cues for safety and attention, as pt would barely kick block then occasionally step on it. Pt returned to bed, in care of [place], [place].  See function navigator for current status.   Drakkar Medeiros 03/24/2017, 11:14 AM

## 2017-03-24 NOTE — Progress Notes (Signed)
Speech Language Pathology Daily Session Note  Patient Details  Name: Loni BeckwithMiguel Garcia Cardenas MRN: [id number] Date of Birth: [birth year]  Today's Date: 03/24/2017 SLP Individual Time: 1300-1400 SLP Individual Time Calculation (min): 60 min  Short Term Goals: Week 1: SLP Short Term Goal 1 (Week 1): STGs=LTGs  Skilled Therapeutic Interventions: Skilled treatment session focused on dysphagia and cognitive goals. Upon arrival, patient was on floor with RNs present. Please see RN note for details. Patient able to transfer from the floor to the wheelchair with supervision verbal cues. Patient consumed lunch meal of Dys. 2 textures with thin liquids without overt s/s of aspiration and required Mod A verbal cues for use of swallowing compensatory strategies.  Patient also participated in a basic money management task and required Mod-Max A verbal cues for problem solving. Patient handed off to OT. Continue with current plan of care.      Function:  Eating Eating   Modified Consistency Diet: Yes Eating Assist Level: More than reasonable amount of time;Supervision or verbal cues           Cognition Comprehension Comprehension assist level: Understands basic 75 - 89% of the time/ requires cueing 10 - 24% of the time  Expression   Expression assist level: Expresses basic 50 - 74% of the time/requires cueing 25 - 49% of the time. Needs to repeat parts of sentences.  Social Interaction Social Interaction assist level: Interacts appropriately 50 - 74% of the time - May be physically or verbally inappropriate.  Problem Solving Problem solving assist level: Solves basic 25 - 49% of the time - needs direction more than half the time to initiate, plan or complete simple activities  Memory Memory assist level: Recognizes or recalls 25 - 49% of the time/requires cueing 50 - 75% of the time    Pain No/Denies Pain   Therapy/Group: Individual Therapy  Mallie Giambra 03/24/2017, 3:54 PM

## 2017-03-24 NOTE — Progress Notes (Addendum)
Physical Therapy Session Note  Patient Details  Name: Jamie BeckwithMiguel Garcia Cardenas MRN: [id number] Date of Birth: July 03, 1966  Today's Date: 03/24/2017 PT Individual Time: 0805-0900 PT Individual Time Calculation (min): 55 min   Short Term Goals: Week 1:  PT Short Term Goal 1 (Week 1): STG = LTG due to short ELOS.  Skilled Therapeutic Interventions/Progress Updates:  Pt received in room & agreeable to tx. No family present for caregiver education. Session focused on functional mobility, stair negotiation, UE coordination, strengthening & NMR. Pt donned shirt & shorts with supervision assistance. Pt ambulated throughout unit with supervision overall, requiring occasional steady assist to prevent LOB. Pt also requires moderate cuing to attend to L to locate room on L side of hallway. Pt negotiates 2 flights of stairs with L rail only with cuing for hand placement and min assist for balance. Pt with improving ability to demonstrate step-to pattern with minimal cuing. Pt utilized kinetron in sitting for BLE strengthening & endurance training. Pt with improved ability to use LLE during task. Pt retrieved beans from pink (medium) therapy putty with max cuing to count number of beans left to retrieve. Pt also required cuing for continued participation in task. Pt placed graded resistance clothespins on bin with LUE for strengthening & NMR; pt with difficulty orienting and manipulating device with LUE only. At end of session pt left in bed positioned low to the floor, 4 rails up, bed alarm on, and call bell within reach.   Therapy Documentation Precautions:  Precautions Precautions: Fall Restrictions Weight Bearing Restrictions: No  Pain: No behaviors noting or c/o pain.   See Function Navigator for Current Functional Status.   Therapy/Group: Individual Therapy  Sandi MariscalVictoria M Brodrick Curran 03/24/2017, 12:24 PM

## 2017-03-24 NOTE — Progress Notes (Signed)
   03/24/17 1300  What Happened  Was fall witnessed? No

## 2017-03-24 NOTE — Care Management Note (Signed)
[facility] Individual Statement of Services  Patient Name:  Jamie BeckwithMiguel Garcia Berg  Date:  03/24/2017  Welcome to the [facility].  Our goal is to provide you with an individualized program based on your diagnosis and situation, designed to meet your specific needs.  With this comprehensive rehabilitation program, you will be expected to participate in at least 3 hours of rehabilitation therapies Monday-Friday, with modified therapy programming on the weekends.  Your rehabilitation program will include the following services:  Physical Therapy (PT), Occupational Therapy (OT), Speech Therapy (ST), 24 hour per day rehabilitation nursing, Therapeutic Recreaction (TR), Case Management (Social Worker), Rehabilitation Medicine, Nutrition Services and Pharmacy Services  Weekly team conferences will be held on Tuesdays to discuss your progress.  Your Social Worker will talk with you frequently to get your input and to update you on team discussions.  Team conferences with you and your family in attendance may also be held.  Expected length of stay: 5-7 days  Overall anticipated outcome: supervision  Depending on your progress and recovery, your program may change. Your Social Worker will coordinate services and will keep you informed of any changes. Your Social Worker's name and contact numbers are listed  below.  The following services may also be recommended but are not provided by the [facility]:   Driving Evaluations  Home Health Rehabiltiation Services  Outpatient Rehabilitation Services  Vocational Rehabilitation   Arrangements will be made to provide these services after discharge if needed.  Arrangements include referral to agencies that provide these services.  Your insurance has been verified to be:  None Your primary doctor is:  None  Pertinent information will be shared with your doctor and your insurance  company.  Social Worker:  AshlandLucy Shenique Childers, [place] [phone] or ([phone]) [phone]   Information discussed with and copy given to patient by: Amada JupiterHOYLE, Lanny Donoso, 03/24/2017, 12:15 PM

## 2017-03-25 ENCOUNTER — Inpatient Hospital Stay (HOSPITAL_COMMUNITY): Payer: Self-pay | Admitting: Speech Pathology

## 2017-03-25 ENCOUNTER — Inpatient Hospital Stay (HOSPITAL_COMMUNITY): Payer: Self-pay

## 2017-03-25 ENCOUNTER — Inpatient Hospital Stay (HOSPITAL_COMMUNITY): Payer: Self-pay | Admitting: Occupational Therapy

## 2017-03-25 DIAGNOSIS — S062X3S Diffuse traumatic brain injury with loss of consciousness of 1 hour to 5 hours 59 minutes, sequela: Secondary | ICD-10-CM

## 2017-03-25 LAB — GLUCOSE, CAPILLARY
GLUCOSE-CAPILLARY: 86 mg/dL (ref 65–99)
Glucose-Capillary: 121 mg/dL — ABNORMAL HIGH (ref 65–99)
Glucose-Capillary: 89 mg/dL (ref 65–99)
Glucose-Capillary: 87 mg/dL (ref 65–99)

## 2017-03-25 MED ORDER — TRAMADOL HCL 50 MG PO TABS: 50.0000 mg | ORAL_TABLET | Freq: Four times a day (QID) | ORAL | Status: DC | PRN

## 2017-03-25 MED ORDER — LEVETIRACETAM 500 MG PO TABS
500.0000 mg | ORAL_TABLET | Freq: Two times a day (BID) | ORAL | Status: DC
  Administered 2017-03-25 – 2017-03-27 (×4): 500 mg via ORAL
  Filled 2017-03-25 (×4): qty 1

## 2017-03-25 NOTE — Progress Notes (Signed)
Physical Therapy Session Note  Patient Details  Name: [name] MRN: [id number] Date of Birth: 1966/05/13  Today's Date: 03/25/2017 PT Individual Time: 1000-1100 PT Individual Time Calculation (min): 60 min   Short Term Goals: Week 1:  PT Short Term Goal 1 (Week 1): STG = LTG due to short ELOS.  Skilled Therapeutic Interventions/Progress Updates:   Pt's sister, [name], present during session and denies any concerns in regards to d/c at this time. Session focused on addressing functional mobility, NMR for functional use of LUE/LLE and balance, cognitive remediation, and overall endurance. Pt performed functional mobility at overall supervision to min assist level including basic transfers, toilet transfers and toileting, gait without AD, and picking up items off of floor. Engaged in cognitive activity during mobility to identify and match colored bean bags to colored block on floor, then identify the food group and address memory for recall of color matching when cleaning up colors in designated order. Pt initially able to complete with min verbal cues but by end of activity requiring mod verbal cues and demonstrating difficulty with attention due to busy gym environment. Throughout session addressed bimanual tasks (donning and doffing socks and shoes, bean bag manipulation, carrying the container with both hands, etc) for forced functional use of LUE.   Therapy Documentation Precautions:  Precautions Precautions: Fall Restrictions Weight Bearing Restrictions: No   Pain: Pain Assessment Pain Assessment: No/denies pain  See Function Navigator for Current Functional Status.   Therapy/Group: Individual Therapy  Karolee StampsGray, Zeric Baranowski Darrol PokeBrescia  Endora Teresi B. Armond Cuthrell, PT, DPT  03/25/2017, 11:50 AM

## 2017-03-25 NOTE — Progress Notes (Signed)
[place] PHYSICAL MEDICINE & REHABILITATION     PROGRESS NOTE    Subjective/Complaints: No new issues. Slept well  ROS: pt denies nausea, vomiting, diarrhea, cough, shortness of breath or chest pain    Objective: Vital Signs: Blood pressure 112/69, pulse 85, temperature 98 F (36.7 C), temperature source Oral, resp. rate 18, height 5\' 2"  (1.575 m), weight 60.3 kg (132 lb 15 oz), SpO2 98 %. No results found.  Recent Labs  03/23/17 0550  WBC 4.9  HGB 10.2*  HCT 30.0*  PLT 236    Recent Labs  03/23/17 0550  NA 139  K 3.9  CL 103  GLUCOSE 159*  BUN 14  CREATININE 0.66  CALCIUM 9.0   CBG (last 3)   Recent Labs  03/24/17 1202 03/24/17 2050 03/25/17 0626  GLUCAP 99 102* 121*    Wt Readings from Last 3 Encounters:  03/22/17 60.3 kg (132 lb 15 oz)  03/22/17 60.3 kg (133 lb)  07/31/16 68 kg (150 lb)    Physical Exam:  Constitutional: He appears well-developed and well-nourished.  HENT:  Cranioplasty site clean and dry with staples intact, remains non-tender Eyes: EOM are normal. Right eye exhibits no discharge. Left eye exhibits no discharge.  Pupils round and reactive to light  Neck: Normal range of motion. Neck supple. No thyromegaly present.  Cardiovascular: Normal rate, regular rhythm and normal heart sounds.   Respiratory: Effort normal and breath sounds normal.  GI: Soft. Bowel sounds are normal. He exhibits no distension. Abdominal incision clean and dry Musculoskeletal: He exhibits no edema or tenderness.  Neurological: He is alert.  Mtoor: 4+/5 throughout all 4, doesn't initiate as much with left lower extremity A&Ox1. Follows simple commands. Attention improved Skin: Skin is warm and dry.  Psychiatric: pleasant and cooperative   Assessment/Plan: 1. Cognitive and functional deficits secondary to TBI which require 3+ hours per day of interdisciplinary therapy in a comprehensive inpatient rehab setting. Physiatrist is providing close team  supervision and 24 hour management of active medical problems listed below. Physiatrist and rehab team continue to assess barriers to discharge/monitor patient progress toward functional and medical goals.  Function:  Bathing Bathing position Bathing activity did not occur: Refused Position: Systems developerhower  Bathing parts Body parts bathed by patient: Right arm, Left arm, Chest, Abdomen, Front perineal area, Buttocks, Right upper leg, Left upper leg, Right lower leg, Left lower leg Body parts bathed by helper: Back  Bathing assist Assist Level: Supervision or verbal cues      Upper Body Dressing/Undressing Upper body dressing Upper body dressing/undressing activity did not occur: Refused What is the patient wearing?: Pull over shirt/dress     Pull over shirt/dress - Perfomed by patient: Put head through opening, Thread/unthread right sleeve, Thread/unthread left sleeve, Pull shirt over trunk          Upper body assist Assist Level: Supervision or verbal cues, Set up   Set up : To obtain clothing/put away  Lower Body Dressing/Undressing Lower body dressing   What is the patient wearing?: Socks, Shoes, Underwear, Pants Underwear - Performed by patient: Thread/unthread right underwear leg, Thread/unthread left underwear leg, Pull underwear up/down   Pants- Performed by patient: Thread/unthread right pants leg, Thread/unthread left pants leg, Pull pants up/down   Non-skid slipper socks- Performed by patient: Don/doff right sock, Don/doff left sock   Socks - Performed by patient: Don/doff right sock, Don/doff left sock   Shoes - Performed by patient: Don/doff left shoe, Don/doff right shoe, Fasten right,  Fasten left            Lower body assist Assist for lower body dressing: Touching or steadying assistance (Pt > 75%)      Toileting Toileting   Toileting steps completed by patient: Adjust clothing prior to toileting, Performs perineal hygiene, Adjust clothing after  toileting Toileting steps completed by helper: Adjust clothing prior to toileting, Performs perineal hygiene, Adjust clothing after toileting    Toileting assist Assist level: Touching or steadying assistance (Pt.75%)   Transfers Chair/bed transfer   Chair/bed transfer method: Ambulatory Chair/bed transfer assist level: Supervision or verbal cues       Locomotion Ambulation     Max distance: 150 ft Assist level: Supervision or verbal cues   Wheelchair Wheelchair activity did not occur: N/A        Cognition Comprehension Comprehension assist level: Understands basic 75 - 89% of the time/ requires cueing 10 - 24% of the time  Expression Expression assist level: Expresses basic 50 - 74% of the time/requires cueing 25 - 49% of the time. Needs to repeat parts of sentences.  Social Interaction Social Interaction assist level: Interacts appropriately 50 - 74% of the time - May be physically or verbally inappropriate.  Problem Solving Problem solving assist level: Solves basic 25 - 49% of the time - needs direction more than half the time to initiate, plan or complete simple activities  Memory Memory assist level: Recognizes or recalls 25 - 49% of the time/requires cueing 50 - 75% of the time   Medical Problem List and Plan: 1.  Decreased functional mobility secondary to right intraparenchymal hemorrhage-SDH-temporal bone fracture after fall. Status post right frontotemporoparietal craniectomy 01/14/2017 with cranioplasty 03/19/2017  -ELOS 5/26  -in low bed 2.  DVT Prophylaxis/Anticoagulation: SCDs. Patient is ambulatory 3. Pain Management: Hycet when necessary 4. Mood: Resume Ritalin 10 mg twice a day, Seroquel 12.5 mg daily at bedtime 5. Neuropsych: This patient is capable of making decisions on not own behalf. 6. Skin/Wound Care: Routine skin checks 7. Fluids/Electrolytes/Nutrition: Routine I&O with follow-up chemistries 8. Seizure prophylaxis. Keppra 750 mg twice a day. 9.  Dysphagia. Dysphagia #2 FEN liquids. Follow-up speech therapy 10. Urinary retention/Escherichia coli UTI. Urecholine 10 mg 3 times a day as he's still having some retention  - F/U ucx negative--off abx 11. Diabetes mellitus peripheral neuropathy. Hemoglobin A1c 10.4. Glucotrol 5 mg twice a day,  -  Resumed Glucophage at 500 mg daily(twice daily as prior to admission)  -off lantus  -sugars under control at present 12. Hypertension. Patient on lisinopril 2.5 mg daily prior to admission. Resume as needed    LOS (Days) 3 A FACE TO FACE EVALUATION WAS PERFORMED  [name], MD 03/25/2017 9:18 AM

## 2017-03-25 NOTE — Progress Notes (Signed)
Speech Language Pathology Daily Session Note  Patient Details  Name: [name] MRN: [id number] Date of Birth: Nov 21, 1965  Today's Date: 03/25/2017 SLP Individual Time: 1103-1200 SLP Individual Time Calculation (min): 57 min  Short Term Goals: Week 1: SLP Short Term Goal 1 (Week 1): STGs=LTGs  Skilled Therapeutic Interventions:  Pt was seen for skilled ST targeting cognitive goals.  Pt in bathroom upon arrival attempting to have a bowel movement without success.  Pt was perseverative on going to the bathroom and attempted 2 more times during today's therapy session without success.  Informed RN as pt does not typically perseverate on going to the bathroom.  Pt was able to recall and follow 1-2 step verbal commands within a structured task for 100% accuracy with min assist to recognize and correct errors.  SLP also facilitated the session with a n-1 working memory task with playing cards.  During task pt needed max faded to min assist verbal cues for use of memory compensatory strategies such as self talk, repetition, and focused, deliberate concentration on a targeted item(i.e. Look at this card and remember this card) for ~80% accuracy.  Pt was returned to room and left in wheelchair with sister at bedside.  Continue per current plan of care.       Function:  Eating Eating               Cognition Comprehension Comprehension assist level: Understands basic 90% of the time/cues < 10% of the time  Expression   Expression assist level: Expresses basic 90% of the time/requires cueing < 10% of the time.  Social Interaction Social Interaction assist level: Interacts appropriately 75 - 89% of the time - Needs redirection for appropriate language or to initiate interaction.  Problem Solving Problem solving assist level: Solves basic 25 - 49% of the time - needs direction more than half the time to initiate, plan or complete simple activities  Memory Memory assist level: Recognizes  or recalls 25 - 49% of the time/requires cueing 50 - 75% of the time    Pain Pain Assessment Pain Assessment: No/denies pain  Therapy/Group: Individual Therapy  [name], [name] 03/25/2017, 12:34 PM

## 2017-03-25 NOTE — IPOC Note (Signed)
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, sequela (HCC) Active Problems:   Diabetes type 2, uncontrolled (HCC)   Dysphagia, oropharyngeal   Essential hypertension   Urinary retention     Functional Problem List: Nursing Bladder  PT Balance, Behavior, Endurance, Motor, Safety, Perception  OT Balance, Behavior, Cognition, Endurance, Pain, Safety  SLP Cognition  TR         Basic ADL's: OT Grooming, Bathing, Dressing, Toileting     Advanced  ADL's: OT       Transfers: PT Bed Mobility, Bed to Chair, Car, State [employer] CorporationFurniture, Floor  OT Toilet, Tub/Shower     Locomotion: PT Ambulation, Stairs     Additional Impairments: OT None  SLP Swallowing, Social Cognition   Problem Solving, Social Interaction, Attention, Awareness, Memory  TR      Anticipated Outcomes Item Anticipated Outcome  Self Feeding    Swallowing  Min A   Basic self-care     Toileting      Bathroom Transfers    Bowel/Bladder  Continent bowel and bladder without retention, no s/s infection.  Transfers  supervision overall  Locomotion  supervision/min assist   Communication     Cognition  Min-Mod A  Pain  Managed at goal 2/10  Safety/Judgment  Increased safety awareness, no falls, injury this admission.   Therapy Plan: PT Intensity: Minimum of 1-2 x/day ,45 to 90 minutes PT Frequency: 5 out of 7 days PT Duration Estimated Length of Stay: 1 week OT Intensity: Minimum of 1-2 x/day, 45 to 90 minutes OT Frequency: 5 out of 7 days OT Duration/Estimated Length of Stay: 3-5 days SLP Intensity: Minumum of 1-2 x/day, 30 to 90 minutes SLP Frequency: 3 to 5 out of 7 days SLP Duration/Estimated Length of Stay: 1 week        Team Interventions: Nursing Interventions  Patient/Family Education  PT interventions Ambulation/gait training, DME/adaptive equipment instruction, Neuromuscular re-education, Psychosocial support, Stair training, UE/LE Strength taining/ROM, UE/LE Coordination activities, Therapeutic Activities, Pain management, Discharge planning, Balance/vestibular training, Cognitive remediation/compensation, Disease management/prevention, Functional mobility training, Patient/family education, Therapeutic Exercise, Visual/perceptual remediation/compensation  OT Interventions Balance/vestibular training, Cognitive remediation/compensation, Community reintegration, Discharge planning, Functional mobility training, Psychosocial support, Therapeutic Activities, UE/LE Coordination activities, Patient/family education, DME/adaptive equipment instruction, UE/LE Strength taining/ROM, Wheelchair propulsion/positioning, Therapeutic Exercise, Self Care/advanced ADL retraining, Neuromuscular re-education  SLP Interventions Cognitive remediation/compensation, Cueing hierarchy, Environmental controls, Functional tasks, Dysphagia/aspiration precaution training, Internal/external aids, Patient/family education, Therapeutic Activities  TR Interventions    SW/CM Interventions      Team Discharge Planning: Destination: PT-Home ,OT- Home , SLP-Home Projected Follow-up: PT-24 hour supervision/assistance, OT-  Home health OT, SLP-Home Health SLP, 24 hour supervision/assistance Projected Equipment Needs: PT-To be determined, OT- Tub/shower bench, SLP-None recommended by SLP Equipment Details: PT- , OT-  Patient/family involved in discharge planning: PT- Patient unable/family or caregiver not available,  OT-Family member/caregiver, SLP-Patient  MD ELOS: 5/26 Medical Rehab Prognosis:  Excellent Assessment: The patient has been admitted for CIR therapies with the diagnosis of TBI s/p cranioplasty. The team will be addressing functional mobility, strength, stamina, balance,  safety, adaptive techniques and equipment, self-care, bowel and bladder mgt, patient and caregiver education, cognition, behavior, wound care. Goals have been set at supervision to min assist.    Ranelle OysterZachary T. Keondre Markson, MD, [facility]. [facility]utz Va Medical CenterFAAPMR  See Team Conference Notes for weekly updates to the plan of care

## 2017-03-25 NOTE — Progress Notes (Signed)
Occupational Therapy Session Note  Patient Details  Name: Loni BeckwithMiguel Garcia Cardenas MRN: [id number] Date of Birth: 29-May-1966  Today's Date: 03/25/2017 OT Individual Time: [id number] OT Individual Time Calculation (min): 58 min    Short Term Goals: Week 1:  OT Short Term Goal 1 (Week 1): STGs=LTGs secondary to short estimated LOS  Skilled Therapeutic Interventions/Progress Updates:    Upon entering the room, pt sleeping soundly in bed. Pt waking easily and agreeable to OT intervention. Pt ambulating to dayroom for breakfast tray. Pt required supervision with mod cues for small sips, small bites, and pocketing. Pt returning to room with min cues to locate room. Pt standing at sink to brush teeth and wash hands with supervision and min cues for sequencing and initiation. Pt returning to bed at end of session with bed lowered and bed alarm activated with bed rails up. Pt oriented to self and time only this session.  Therapy Documentation Precautions:  Precautions Precautions: Fall Restrictions Weight Bearing Restrictions: No General:   Vital Signs: Therapy Vitals Temp: 98 F (36.7 C) Temp Source: Oral Pulse Rate: 85 Resp: 18 BP: 112/69 Patient Position (if appropriate): Lying Oxygen Therapy SpO2: 98 % O2 Device: Not Delivered Pain:   ADL:   Vision   Perception    Praxis   Exercises:   Other Treatments:    See Function Navigator for Current Functional Status.   Therapy/Group: Individual Therapy  [name], Ishmail Mcmanamon P 03/25/2017, 8:59 AM

## 2017-03-25 NOTE — Discharge Instructions (Signed)
Inpatient Rehab Discharge Instructions  Jamie LeapMiguel Garcia Berg Discharge date and time: No discharge date for patient encounter.   Activities/Precautions/ Functional Status: Activity: activity as tolerated Diet: Dysphagia #2 thin liquids Wound Care: keep wound clean and dry Functional status:  ___ No restrictions     ___ Walk up steps independently ___ 24/7 supervision/assistance   ___ Walk up steps with assistance ___ Intermittent supervision/assistance  ___ Bathe/dress independently ___ Walk with walker     _x__ Bathe/dress with assistance ___ Walk Independently    ___ Shower independently ___ Walk with assistance    ___ Shower with assistance ___ No alcohol     ___ Return to work/school ________    COMMUNITY REFERRALS UPON DISCHARGE:    Home Health:   PT     OT     ST    RN     SW                    Agency: [facility] Phone: [phone]   Medical Equipment/Items Ordered: wheelchair, cushion, commode and tub bench                                                     Agency/Supplier:  [facility] @ [phone]   GENERAL COMMUNITY RESOURCES FOR PATIENT/FAMILY:  Support Groups: Brain Injury Support Group  (flyer)      Special Instructions: Supervision for safety   My questions have been answered and I understand these instructions. I will adhere to these goals and the provided educational materials after my discharge from the hospital.  Patient/Caregiver Signature _______________________________ Date __________  Clinician Signature _______________________________________ Date __________  Please bring this form and your medication list with you to all your follow-up doctor's appointments.

## 2017-03-25 NOTE — Progress Notes (Signed)
Occupational Therapy Session Note  Patient Details  Name: Jamie BeckwithMiguel Garcia Berg MRN: [id number] Date of Birth: November 07, 1965  Today's Date: 03/25/2017 OT Individual Time: 1300-1400 OT Individual Time Calculation (min): 60 min    Short Term Goals: Week 1:  OT Short Term Goal 1 (Week 1): STGs=LTGs secondary to short estimated LOS  Skilled Therapeutic Interventions/Progress Updates:    Pt initially engaged in BADL retraining including bathing at shower level and dressing with sit<>stand from chair.  Pt amb into bathroom at supervision level and completed all bathing tasks at supervision level with min verbal cues for thoroughness.  Pt requires min verbal cues to use LUE in functional tasks.  Pt completed dressing with min verbal cues for orientation of pull over shirt before donning.  Pt attempted to don shirt backwards and required min multimodal cues to correct.  Pt transitioned to ADL apartment and therapy gym for ongoing family education and therapeutic activities/tasks with focus on balance, following one step commands, attention to L, increased LUE use, and safety awareness.  Pt returned to room and remained in bed with bed alarm activated.  Pt's sister present throughout session.  Therapy Documentation Precautions:  Precautions Precautions: Fall Restrictions Weight Bearing Restrictions: No   Pain: Pain Assessment Pain Assessment: No/denies pain  See Function Navigator for Current Functional Status.   Therapy/Group: Individual Therapy  Rich BraveLanier, Ollis Daudelin [name] 03/25/2017, 2:48 PM

## 2017-03-26 ENCOUNTER — Inpatient Hospital Stay (HOSPITAL_COMMUNITY): Payer: Self-pay | Admitting: Speech Pathology

## 2017-03-26 ENCOUNTER — Inpatient Hospital Stay (HOSPITAL_COMMUNITY): Payer: Self-pay | Admitting: Occupational Therapy

## 2017-03-26 ENCOUNTER — Inpatient Hospital Stay (HOSPITAL_COMMUNITY): Payer: Self-pay | Admitting: Physical Therapy

## 2017-03-26 ENCOUNTER — Inpatient Hospital Stay (HOSPITAL_COMMUNITY): Payer: Self-pay

## 2017-03-26 LAB — GLUCOSE, CAPILLARY
GLUCOSE-CAPILLARY: 111 mg/dL — AB (ref 65–99)
GLUCOSE-CAPILLARY: 95 mg/dL (ref 65–99)
Glucose-Capillary: 128 mg/dL — ABNORMAL HIGH (ref 65–99)
Glucose-Capillary: 117 mg/dL — ABNORMAL HIGH (ref 65–99)

## 2017-03-26 MED ORDER — GLIPIZIDE 5 MG PO TABS: 5.0000 mg | ORAL_TABLET | Freq: Two times a day (BID) | ORAL | 1 refills | Status: DC

## 2017-03-26 MED ORDER — TRAMADOL HCL 50 MG PO TABS: 50.0000 mg | ORAL_TABLET | Freq: Four times a day (QID) | ORAL | 0 refills | Status: DC | PRN

## 2017-03-26 MED ORDER — LEVETIRACETAM 500 MG PO TABS
500.0000 mg | ORAL_TABLET | Freq: Two times a day (BID) | ORAL | 1 refills | Status: DC
Start: 2017-03-26 — End: 2017-04-01

## 2017-03-26 MED ORDER — METFORMIN HCL 500 MG PO TABS: 500.0000 mg | ORAL_TABLET | Freq: Every day | ORAL | 1 refills | Status: DC

## 2017-03-26 MED ORDER — METHYLPHENIDATE HCL 5 MG PO TABS: 5.0000 mg | ORAL_TABLET | Freq: Two times a day (BID) | ORAL | 0 refills | Status: DC

## 2017-03-26 MED ORDER — QUETIAPINE FUMARATE 25 MG PO TABS: 12.5000 mg | ORAL_TABLET | Freq: Every evening | ORAL | 0 refills | Status: DC | PRN

## 2017-03-26 NOTE — Discharge Summary (Signed)
Occupational Therapy Discharge Summary  Patient Details  Name: [name] MRN: [id number] Date of Birth: May 20, 1966   Patient has met 28 of 11 long term goals due to improved activity tolerance and improved balance, improved attention, improved awareness, improved coordination  Pt made steady progress with BADLs during this admission.  Pt completes all BADLs at supervision level with occasional verbal cue for safety awareness and sequencing.  Pt requires min verbal cues to incorporate LUE in functional tasks.  Pt's sister has been present for and participated in therapy sessions. Pt exhibits behaviors consistent with [place] Level VI. Patient to discharge at overall Supervision level.  Patient's care partner (sister) is independent to provide the necessary cognitive assistance at discharge.    All goals met.   Recommendation:  Patient will benefit from ongoing skilled OT services in home health setting to continue to advance functional skills in the area of BADL. However, due to financial restrictions, pt is unable to be provided with these services at this time.   Equipment: BSC  Reasons for discharge: treatment goals met  Patient/family agrees with progress made and goals achieved: Yes  OT Discharge Vision Vision Assessment?: Vision impaired- to be further tested in functional context Perception  Perception: Impaired Praxis Praxis: Impaired Praxis Impairment Details: Motor planning Cognition Overall Cognitive Status: Impaired/Different from baseline Arousal/Alertness: Awake/alert Orientation Level: Oriented to person;Oriented to place;Disoriented to time;Disoriented to situation Attention: Selective Selective Attention: Impaired Selective Attention Impairment: Verbal basic;Functional basic Memory: Impaired Memory Impairment: Decreased recall of new information;Decreased short term memory Decreased Short Term Memory: Verbal basic;Functional basic Awareness:  Impaired Awareness Impairment: Intellectual impairment Problem Solving: Impaired Problem Solving Impairment: Functional basic;Verbal basic Rancho Los Amigos Scales of Cognitive Functioning: Confused/appropriate Sensation Sensation Light Touch: Appears Intact Stereognosis: Not tested Hot/Cold: Appears Intact Proprioception: Impaired by gross assessment Coordination Gross Motor Movements are Fluid and Coordinated: No Fine Motor Movements are Fluid and Coordinated: No Trunk/Postural Assessment  Cervical Assessment Cervical Assessment: Within Functional Limits Thoracic Assessment Thoracic Assessment: Within Functional Limits Lumbar Assessment Lumbar Assessment: Within Functional Limits Postural Control Postural Control: Within Functional Limits  Balance Dynamic Sitting Balance Sitting balance - Comments: supervision Extremity/Trunk Assessment RUE Assessment RUE Assessment: Within Functional Limits LUE Assessment LUE Assessment: Exceptions to WFL (AROM WFL, 4/5 overall strength, decreased awareness)   See Function Navigator for Current Functional Status.  Leotis Shames [facility] 03/26/2017, 9:42 AM

## 2017-03-26 NOTE — Progress Notes (Signed)
Occupational Therapy Session Note  Patient Details  Name: Jamie BeckwithMiguel Garcia Berg MRN: [id number] Date of Birth: 10/01/1966  Today's Date: 03/26/2017 OT Individual Time: 1300-1405 OT Individual Time Calculation (min): 65 min    Short Term Goals: Week 1:  OT Short Term Goal 1 (Week 1): STGs=LTGs secondary to short estimated LOS     Skilled Therapeutic Interventions/Progress Updates:    Tx focus on cognitive remediation and dynamic balance.   Pt greeted in bathroom. After pt completed hygiene, his sister provided supervision while he ambulated to bedroom chair. Pt then ambulated to outdoor environment. Had him engage in scavenger hunt with 3 items. Pt ambulating over uneven surfaces, through mulch, and around environmental barriers with Min A and cues for safety awareness (i.e. Pt straddling shrub instead of walking around it). Pt able to locate 3/3 items, and at the end of session able to name items he had located x2 instances. Pt then ambulated back to unit, required mod cues for pathfinding using visual aides. Pt stumbled near gym entrance, fell onto leg and then on left side. His head did not make contact with floor. Pt required Mod A to recover and ambulated to nearby chair. No visible injuries, and pt reported he did not feel pain. RN notified. Pt ambulated back to room (Min A) and reported needing to void. Pt ambulated to toilet. Interpretor in bathroom with pt and instructed them to pull call light when pt was finished. NT made aware of pts position at time of departure.   Therapy Documentation Precautions:  Precautions Precautions: Fall Restrictions Weight Bearing Restrictions: No   Vital Signs: Therapy Vitals Temp: 98.9 F (37.2 C) Temp Source: Oral Pulse Rate: (!) 102 Resp: 18 BP: (!) 95/50 Patient Position (if appropriate): Lying Oxygen Therapy SpO2: 99 % O2 Device: Not Delivered Pain: Pain Assessment Pain Assessment: No/denies pain ADL:     Perception   Perception: Impaired Inattention/Neglect:  (decreased attention to L side of body & L visual field) Praxis Praxis: Impaired Praxis Impairment Details: Motor planning (LUE)    See Function Navigator for Current Functional Status.   Therapy/Group: Individual Therapy  Yuval Nolet A Tiffinie Caillier 03/26/2017, 3:53 PM

## 2017-03-26 NOTE — Progress Notes (Signed)
Occupational Therapy Session Note  Patient Details  Name: Loni BeckwithMiguel Garcia Cardenas MRN: [id number] Date of Birth: 1966-07-18  Today's Date: 03/26/2017 OT Individual Time: 4098-11910800-0859 OT Individual Time Calculation (min): 59 min    Short Term Goals: Week 1:  OT Short Term Goal 1 (Week 1): STGs=LTGs secondary to short estimated LOS  Skilled Therapeutic Interventions/Progress Updates:    Focus on continued BADL retraining including bathing at shower level, dressing with sit<>stand from seat, toileting, and self feeding.  Pt continues to require min verbal cues for initial sequencing during bathing tasks.  Pt required min verbal cues during self feeding to reduce rate of eating and to swallow food in mouth before taking another bite.  Pt also engaged in simple path finding task of locating gym and returning to room without any verbal cues.  Pt returned to bed and remained in bed with bed alarm activated and all needs within reach.   Therapy Documentation Precautions:  Precautions Precautions: Fall Restrictions Weight Bearing Restrictions: No Pain:  Pt denied pain    See Function Navigator for Current Functional Status.   Therapy/Group: Individual Therapy  Rich BraveLanier, Aria Pickrell [name] 03/26/2017, 9:02 AM

## 2017-03-26 NOTE — Progress Notes (Signed)
Speech Language Pathology Discharge Summary  Patient Details  Name: [name] MRN: [id number] Date of Birth: 20-Jun-1966  Today's Date: 03/26/2017 SLP Individual Time: 1000-1100 SLP Individual Time Calculation (min): 60 min   Skilled Therapeutic Interventions:  Skilled treatment session focused on cognition goals. SLP facilitated session with interpreter and sister present. SLP facilitated session by creating daily schedule/routine for pt within home environment. Explanation provided on need for routine within home environment. Attempted to teach novel card game with Mod A needed to execute rules of game.      Patient has met 4 of 4 long term goals.  Patient to discharge at [facility] level.    Clinical Impression/Discharge Summary: Pt has made good progress during skilled ST sessions and as results he has meet 4 of 4 LTGs. Education has been provided to sister and all questions answered to pt and sister satisfaction. Pt is currently at Mod level of assist and requires 24 hour supervision. Sister voices understanding.   Care Partner:  Caregiver Able to Provide Assistance: Yes  Type of Caregiver Assistance: Physical;Cognitive  Recommendation:  Home Health SLP;24 hour supervision/assistance  Rationale for SLP Follow Up: Maximize functional communication;Maximize cognitive function and independence;Maximize swallowing safety;Reduce caregiver burden   Equipment: N/A   Reasons for discharge: Treatment goals met   Patient/Family Agrees with Progress Made and Goals Achieved: Yes   Function:  Eating Eating   Modified Consistency Diet: Yes Eating Assist Level: Supervision or verbal cues           Cognition Comprehension Comprehension assist level: Understands basic 90% of the time/cues < 10% of the time  Expression   Expression assist level: Expresses basic 90% of the time/requires cueing < 10% of the time.  Social Interaction Social Interaction assist level:  Interacts appropriately 75 - 89% of the time - Needs redirection for appropriate language or to initiate interaction.  Problem Solving Problem solving assist level: Solves basic 25 - 49% of the time - needs direction more than half the time to initiate, plan or complete simple activities  Memory Memory assist level: Recognizes or recalls 25 - 49% of the time/requires cueing 50 - 75% of the time   Montey Ebel 03/26/2017, 12:53 PM

## 2017-03-26 NOTE — Progress Notes (Signed)
Social Work  Discharge Note  The overall goal for the admission was met for:   Discharge location: Yes - home with sister to provide 24/7 supervision/ assist  Length of Stay: Yes - TOTAL days on CIR = 51 days (with discharge as planned on 5/26)  Discharge activity level: Yes - close supervision to some min assist  Home/community participation: Yes  Services provided included: MD, RD, PT, OT, SLP, RN, TR, Pharmacy and SW  Financial Services: None - pt only eligible for emergency MA  Follow-up services arranged: Home Health: RN, PT, OT, ST,SW via [facility], DME: 16x16 lightweigh w/c, cushion, 3n1 commode and tub bench via [facility], Other: Referred pt for [facility] program and to [facility] and Patient/Family has no preference for HH/DME agencies  Comments (or additional information):  Patient/Family verbalized understanding of follow-up arrangements: Yes  Individual responsible for coordination of the follow-up plan: sister  Confirmed correct DME delivered: ,  03/26/2017    ,  

## 2017-03-26 NOTE — Plan of Care (Signed)
Problem: RH Ambulation Goal: LTG Patient will ambulate in controlled environment (PT) LTG: Patient will ambulate in a controlled environment, # of feet with assistance (PT).  Outcome: Completed/Met Date Met: 03/26/17 150 ft without AD Goal: LTG Patient will ambulate in home environment (PT) LTG: Patient will ambulate in home environment, # of feet with assistance (PT).  Outcome: Completed/Met Date Met: 03/26/17 150 ft   Problem: RH Stairs Goal: LTG Patient will ambulate up and down stairs w/assist (PT) LTG: Patient will ambulate up and down # of stairs with assistance (PT)  Outcome: Completed/Met Date Met: 03/26/17 Full flight with L rail

## 2017-03-26 NOTE — Progress Notes (Signed)
[facility] PHYSICAL MEDICINE & REHABILITATION     PROGRESS NOTE    Subjective/Complaints: Up with therapy  ROS: pt denies nausea, vomiting, diarrhea, cough, shortness of breath or chest pain     Objective: Vital Signs: Blood pressure (!) 104/55, pulse 86, temperature 97.4 F (36.3 C), temperature source Oral, resp. rate 19, height 5\' 2"  (1.575 m), weight 60.3 kg (132 lb 15 oz), SpO2 98 %. No results found. No results for input(s): WBC, HGB, HCT, PLT in the last 72 hours. No results for input(s): NA, K, CL, GLUCOSE, BUN, CREATININE, CALCIUM in the last 72 hours.  Invalid input(s): CO CBG (last 3)   Recent Labs  03/25/17 1717 03/25/17 2157 03/26/17 0643  GLUCAP 87 86 111*    Wt Readings from Last 3 Encounters:  03/22/17 60.3 kg (132 lb 15 oz)  03/22/17 60.3 kg (133 lb)  07/31/16 68 kg (150 lb)    Physical Exam:  Constitutional: He appears well-developed and well-nourished.  HENT:  Cranioplasty site clean and dry with staples intact, remains non-tender Eyes: EOM are normal. Right eye exhibits no discharge. Left eye exhibits no discharge.  Pupils round and reactive to light  Neck: Normal range of motion. Neck supple. No thyromegaly present.  Cardiovascular: RRR.   Respiratory: Effort normal and breath sounds normal.  GI: Soft. Bowel sounds are normal. He exhibits no distension. Abdominal incision clean and dry Musculoskeletal: He exhibits no edema or tenderness.  Neurological: He is alert.  Mtoor: 4+/5 throughout all 4 A&Ox1. Follows simple commands. Attention improved Skin: Skin is warm and dry.  Psychiatric: pleasant and cooperative   Assessment/Plan: 1. Cognitive and functional deficits secondary to TBI which require 3+ hours per day of interdisciplinary therapy in a comprehensive inpatient rehab setting. Physiatrist is providing close team supervision and 24 hour management of active medical problems listed below. Physiatrist and rehab team continue to  assess barriers to discharge/monitor patient progress toward functional and medical goals.  Function:  Bathing Bathing position Bathing activity did not occur: Refused Position: Systems developerhower  Bathing parts Body parts bathed by patient: Right arm, Left arm, Chest, Abdomen, Front perineal area, Buttocks, Right upper leg, Left upper leg, Right lower leg, Left lower leg Body parts bathed by helper: Back  Bathing assist Assist Level: Supervision or verbal cues      Upper Body Dressing/Undressing Upper body dressing Upper body dressing/undressing activity did not occur: Refused What is the patient wearing?: Pull over shirt/dress     Pull over shirt/dress - Perfomed by patient: Put head through opening, Thread/unthread right sleeve, Thread/unthread left sleeve, Pull shirt over trunk          Upper body assist Assist Level: Supervision or verbal cues   Set up : To obtain clothing/put away  Lower Body Dressing/Undressing Lower body dressing   What is the patient wearing?: Socks, Shoes, Underwear, Pants Underwear - Performed by patient: Thread/unthread right underwear leg, Thread/unthread left underwear leg, Pull underwear up/down   Pants- Performed by patient: Thread/unthread right pants leg, Thread/unthread left pants leg, Pull pants up/down   Non-skid slipper socks- Performed by patient: Don/doff right sock, Don/doff left sock   Socks - Performed by patient: Don/doff right sock, Don/doff left sock   Shoes - Performed by patient: Don/doff right shoe, Don/doff left shoe            Lower body assist Assist for lower body dressing: Supervision or verbal cues      Toileting Toileting   Toileting steps completed  by patient: Adjust clothing prior to toileting, Performs perineal hygiene, Adjust clothing after toileting Toileting steps completed by helper: Adjust clothing prior to toileting, Performs perineal hygiene, Adjust clothing after toileting Toileting Assistive Devices: Grab bar or  rail  Toileting assist Assist level: Supervision or verbal cues   Transfers Chair/bed transfer   Chair/bed transfer method: Ambulatory Chair/bed transfer assist level: Touching or steadying assistance (Pt > 75%)       Locomotion Ambulation     Max distance: 150' Assist level: Supervision or verbal cues   Wheelchair Wheelchair activity did not occur: N/A        Cognition Comprehension Comprehension assist level: Understands basic 90% of the time/cues < 10% of the time  Expression Expression assist level: Expresses basic 90% of the time/requires cueing < 10% of the time.  Social Interaction Social Interaction assist level: Interacts appropriately 75 - 89% of the time - Needs redirection for appropriate language or to initiate interaction.  Problem Solving Problem solving assist level: Solves basic 25 - 49% of the time - needs direction more than half the time to initiate, plan or complete simple activities  Memory Memory assist level: Recognizes or recalls 25 - 49% of the time/requires cueing 50 - 75% of the time   Medical Problem List and Plan: 1.  Decreased functional mobility secondary to right intraparenchymal hemorrhage-SDH-temporal bone fracture after fall. Status post right frontotemporoparietal craniectomy 01/14/2017 with cranioplasty 03/19/2017  -ELOS 5/26  -family ed  -Patient to see MD in the office for transitional care encounter in 1-2 weeks. 2.  DVT Prophylaxis/Anticoagulation: SCDs. Patient is ambulatory 3. Pain Management: Hycet when necessary 4. Mood: Resume Ritalin 5 mg twice a day, Seroquel 12.5 mg daily at bedtime 5. Neuropsych: This patient is capable of making decisions on not own behalf. 6. Skin/Wound Care: Routine skin checks 7. Fluids/Electrolytes/Nutrition: Routine I&O with follow-up chemistries 8. Seizure prophylaxis. Keppra 500 mg twice a day. 9. Dysphagia. Dysphagia #2 FEN liquids. Follow-up speech therapy 10. Urinary retention/Escherichia coli UTI.  Urecholine 10 mg 3 times a day --d/c  - F/U ucx negative--off abx 11. Diabetes mellitus peripheral neuropathy. Hemoglobin A1c 10.4. Glucotrol 5 mg twice a day,  -  Resumed Glucophage at 500 mg daily(twice daily as prior to admission)  -off lantus  -sugars under control at present 12. Hypertension. Patient on lisinopril 2.5 mg daily prior to admission. Resume as needed    LOS (Days) 4 A FACE TO FACE EVALUATION WAS PERFORMED  [name], MD 03/26/2017 9:32 AM

## 2017-03-26 NOTE — Progress Notes (Signed)
Physical Therapy Discharge Summary  Patient Details  Name: [name] MRN: [id number] Date of Birth: 1966/10/03  Today's Date: 03/26/2017 PT Individual Time: 1101-1158 PT Individual Time Calculation (min): 57 min    Patient has met 8 of 8 long term goals due to improved activity tolerance, improved balance, improved postural control, increased strength, ability to compensate for deficits, functional use of  right upper extremity, right lower extremity, left upper extremity and left lower extremity, improved attention, improved awareness and improved coordination.  Patient to discharge at an ambulatory level Supervision.   Patient's care partner is independent to provide the necessary physical and cognitive assistance at discharge.  Reasons goals not met: n/a  Recommendation:  Patient will benefit from ongoing skilled PT services in home health setting to continue to advance safe functional mobility, address ongoing impairments in decreased attention, impaired awareness, L NMR, dynamic balance, safety awareness, and minimize fall risk.  Equipment: 16x16 w/c  Reasons for discharge: treatment goals met  Patient/family agrees with progress made and goals achieved: Yes  Skilled PT Treatment: Pt received in room with sister present for session. Pt without c/o or behaviors demonstrating pain during session. First portion of session focused on grad day activities with [name] providing supervision assistance for transfers & ambulation, and min assist for stair negotiation up full flight of stairs with L rail only. [name] voices no concerns or questions regarding pt's d/c home tomorrow. Pt completes floor transfer with supervision assist. Pt engaged in pipe tree activity with task focusing on problem solving and error correction. Pt requires min increasing to max cuing as task progressed from simple to moderately complex shapes. Pt also requires more than reasonable amount of time to  complete task. Pt utilized nu-step on level 5 x 10 minutes with all 4 extremities with task focusing on coordination of reciprocal movements & endurance training. Pt able to terminate task with min cuing on this date. When ambulating back to room pt requires cuing to attend to extreme L to locate room number. At end of session pt left sitting on EOB with sister, LCSW, and PA present.    PT Discharge Precautions/Restrictions Precautions Precautions: Fall Restrictions Weight Bearing Restrictions: No  Pain Pain Assessment Pain Assessment: No/denies pain  Vision/Perception  Perception Perception: Impaired Inattention/Neglect:  (decreased attention to L side of body & L visual field) Praxis Praxis: Impaired Praxis Impairment Details: Motor planning (LUE)   Cognition Overall Cognitive Status: Impaired/Different from baseline Arousal/Alertness: Awake/alert Orientation Level: Oriented to person;Oriented to place;Oriented to time;Oriented to situation (pt oriented to month & year, city, "hospital", and that he's here "for my head") Memory: Impaired Memory Impairment: Decreased short term memory;Decreased recall of new information Problem Solving: Impaired Behaviors: Impulsive Safety/Judgment: Impaired  Sensation Coordination Gross Motor Movements are Fluid and Coordinated: No Fine Motor Movements are Fluid and Coordinated: No  Motor  Motor Motor:  (general weakness)   Mobility Transfers Sit to Stand: 5: Supervision  Locomotion  Ambulation Ambulation: Yes Ambulation/Gait Assistance: 5: Supervision Ambulation Distance (Feet): 150 Feet Assistive device: None Gait Gait: Yes Gait Pattern:  (wide BOS, inconsistent step length BLE) Stairs / Additional Locomotion Stairs: Yes Stairs Assistance: 4: Min assist Stair Management Technique: One rail Left Number of Stairs: 12 Height of Stairs: 6 (inches) Ramp: 5: Supervision (with rail) Wheelchair Mobility Wheelchair Mobility:  No   Balance Balance Balance Assessed: Yes Dynamic Standing Balance Dynamic Standing - Balance Support: No upper extremity supported;During functional activity Dynamic Standing - Level of Assistance: 5:  Stand by assistance Dynamic Standing - Balance Activities:  (during gait over even & uneven surfaces)  Extremity Assessment  RLE Assessment RLE Assessment: Within Functional Limits LLE Assessment LLE Assessment: Within Functional Limits (limited by apraxia)   See Function Navigator for Current Functional Status.  [name] 03/26/2017, 12:31 PM

## 2017-03-26 NOTE — Discharge Summary (Signed)
NAME:  Loni BeckwithGARCIA CARDENAS, Shawna           ACCOUNT NO.:  MEDICAL RECORD NO.:  [id number]  LOCATION:                                 FACILITY:  PHYSICIAN:  Ranelle OysterZachary T. Swartz, M.D.DATE OF BIRTH:  [birth year]  DATE OF ADMISSION:  03/22/2017 DATE OF DISCHARGE:  03/27/2017                              DISCHARGE SUMMARY   DISCHARGE DIAGNOSES: 1. Right intraparenchymal hemorrhage with subdural hematoma, temporal     bone fracture after a fall, status post craniotomy January 14, 2017. 2. Sequential compression stockings for deep vein thrombosis     prophylaxis. 3. Pain management. 4. Seizure prophylaxis. 5. Dysphagia. 6. Urinary retention with Escherichia coli urinary tract infection. 7. Diabetes mellitus. 8. Hypertension.  HISTORY OF PRESENT ILLNESS:  This is a 51 year old, right-handed limited English-speaking male, who lives with his sister, independent prior to admission.  Presented January 10, 2017, after being found down from a supposed fall.  CT of the head and imaging revealed intracranial hemorrhage with a 4.5 cm right frontal intraparenchymal hemorrhage, 6 mm right subdural hematoma, and other scattered areas of intraparenchymal and subarachnoid hemorrhage with right-to-left midline shift. Nondisplaced vertical fracture through the temporal bone.  CT of cervical spine negative.  CT of abdomen and pelvis negative.  Urine drug screen negative.  Neurosurgery, Dr. Marikay Alaravid Jones consulted.  ICP monitor placed.  ENT, Dr. Jenne PaneBates consulted for temporal bone fracture.  No surgical intervention required.  CT imaging showed increased right to left shift greater than 1 cm.  Basal cisterns slightly effaced.  Steady rise with his ICP and not responsive to heavy sedation.  Underwent right frontotemporoparietal craniectomy for hematoma evacuation January 14, 2017, per Dr. Franky Machoabbell with placement of bone flap in the abdomen. Keppra for seizure prophylaxis.  Extubated January 19, 2017, nasogastric tube for  nutritional support.  Placed on subcutaneous heparin for DVT prophylaxis.  Completed a 5-day course of Ancef for pneumonia.  Bouts of urinary retention with Urecholine added as well as greater than 100,000 E coli urinary tract infection, maintained on Cipro.  Physical therapy evaluations were completed.  The patient was admitted for comprehensive rehab program February 01, 2017, placed on Ritalin as well as Seroquel for mood stimulation.  His voiding continued to improve.  Blood sugars monitored, hemoglobin A1c of 10.4.  He was ambulating 200 feet with verbal cues.  Neurosurgery consulted for cranioplasty.  Discharged to Acute Care Services Mar 19, 2017, for cranioplasty.  Decompressive craniectomy, which was retrieved from his abdomen by Dr. Marikay Alaravid Jones. Therapies resumed.  The patient was readmitted for a comprehensive rehab program.  PAST MEDICAL HISTORY:  See discharge diagnoses.  SOCIAL HISTORY:  Lives with sister, independent prior to admission. Functional status upon admission to rehab services was minimal assist, 200 feet without assistive device.  Minimal assist sit to stand, verbal cues for safety, min-to-mod assist activities of daily living.  PHYSICAL EXAMINATION:  VITAL SIGNS:  Blood pressure 115/71, pulse 91, temperature 97, respirations 18. GENERAL:  Alert male, in no acute distress. HEENT:  Cranioplasty site clean and dry.  Staples intact.  EOMs intact. NECK:  Supple.  Nontender.  No JVD. CARDIAC:  Regular rate and rhythm.  No murmur. ABDOMEN:  Soft, nontender.  Good bowel sounds. LUNGS:  Clear to auscultation without wheeze.  REHABILITATION HOSPITAL COURSE:  The patient was admitted to inpatient rehab services with therapies initiated on a 3-hour daily basis, consisting of physical therapy, occupational therapy, speech therapy, and rehabilitation nursing.  The following issues were addressed during the patient's rehabilitation stay.  Pertaining to Mr.  Felipa Furnace intraparenchymal hemorrhage, subdural hematoma, he had undergone craniectomy, cranioplasty, latest being Mar 19, 2017.  He would follow up with Neurosurgery, Dr. Marikay Alar.  Surgical site healing nicely. SCDs for DVT prophylaxis.  The patient was ambulatory.  He was using Hycet, changed to Ultram for pain control.  He continued on Ritalin for mood stimulation, improved awareness.  His Seroquel was discontinued. Keppra for seizure prophylaxis.  His blood sugars were normalizing.  He continued on Glucophage as well as Glucotrol.  Full diabetic teaching. He was off insulin therapy.  Blood pressures monitored, on no current antihypertensive medications.  His diet had been advanced to a dysphagia #2 thin liquid diet.  The patient received weekly collaborative interdisciplinary team conferences to discuss estimated length of stay, family teaching, any barriers to discharge.  Sessions focused on addressing functional mobility, cognitive remediation, overall endurance.  The patient performed functional mobility at overall supervision to minimal assist level including basic transfers, toilet transfers, toileting, ambulation without device, and picking up items from the floor.  Engaged in cognitive activity during mobility to identify and match colored items.  The patient was assisted by an interpreter.  He could gather belongings for activities of daily living and homemaking, ambulate to the day room for his breakfast tray, required supervision with moderate cues for his meals.  He was able to communicate his needs through an interpreter and his family.  Good social interaction.  Again, stressed the family need for overall safety control with multiple family conferences held with his family and discharge planning.  He was discharged to home Mar 27, 2017.  DISCHARGE MEDICATIONS:  Urecholine 10 mg p.o. t.i.d. taper as directed, Colace 100 mg p.o. daily, Glucotrol 5 mg p.o. b.i.d., Keppra  500 mg p.o. b.i.d., Glucophage 500 mg p.o. daily, Ritalin 5 mg p.o. b.i.d., multivitamin daily, Ultram 50 mg p.o. every 6 hours as needed pain.  FOLLOWUP:  The patient would follow up with Dr. Faith Rogue at the outpatient rehab service office as directed; Dr. Marikay Alar, call for appointment.  [facility] appointment to be made.  SPECIAL INSTRUCTIONS:  24 hours supervision for the patient's safety.     Mariam Dollar, P.A.   ______________________________ [name], M.D.    DA/MEDQ  D:  03/26/2017  T:  03/26/2017  Job:  [id number]  cc:   Tia Alert, MD [facility] [name], M.D.

## 2017-03-26 NOTE — Progress Notes (Signed)
Speech Language Pathology Daily Session Note  Patient Details  Name: [name] MRN: [id number] Date of Birth: 02-10-1966  Today's Date: 03/26/2017 SLP Individual Time: 1405-1430 SLP Individual Time Calculation (min): 25 min  Short Term Goals: Week 1: SLP Short Term Goal 1 (Week 1): STGs=LTGs  Skilled Therapeutic Interventions: Skilled treatment session focused on cognitive goals. SLP facilitated session by providing Max A verbal cues for recall and problem solving with a previously learned, novel task. Patient transferred back to bed at end of session and left with bed alarm on and all needs within reach.      Function:  Cognition Comprehension Comprehension assist level: Understands basic 90% of the time/cues < 10% of the time  Expression   Expression assist level: Expresses basic 90% of the time/requires cueing < 10% of the time.  Social Interaction Social Interaction assist level: Interacts appropriately 75 - 89% of the time - Needs redirection for appropriate language or to initiate interaction.  Problem Solving Problem solving assist level: Solves basic 25 - 49% of the time - needs direction more than half the time to initiate, plan or complete simple activities  Memory Memory assist level: Recognizes or recalls 25 - 49% of the time/requires cueing 50 - 75% of the time    Pain Pain Assessment Pain Assessment: No/denies pain  Therapy/Group: Individual Therapy  Ciara Kagan 03/26/2017, 3:28 PM

## 2017-03-26 NOTE — Discharge Summary (Signed)
Discharge summary job [id number]

## 2017-03-27 LAB — CULTURE, BLOOD (ROUTINE X 2)
Culture: NO GROWTH
Culture: NO GROWTH
SPECIAL REQUESTS: ADEQUATE
Special Requests: ADEQUATE

## 2017-03-27 LAB — GLUCOSE, CAPILLARY: Glucose-Capillary: 125 mg/dL — ABNORMAL HIGH (ref 65–99)

## 2017-03-27 NOTE — Progress Notes (Signed)
Pt discharged to home with sister. Discharge instructions given by Harvel Ricksan Anguilli, PA with verbal understanding. Lucy, SW following up with sister regarding ritalin medication. Belongings with pt.

## 2017-03-27 NOTE — Plan of Care (Signed)
Problem: RH SKIN INTEGRITY Goal: RH STG ABLE TO PERFORM INCISION/WOUND CARE W/ASSISTANCE STG Able To Perform Incision/Wound Care With max Assistance.  Outcome: Not Progressing Unable to perform incision care independently  Problem: RH COGNITION-NURSING Goal: RH STG ANTICIPATES NEEDS/CALLS FOR ASSIST W/ASSIST/CUES STG Anticipates Needs/Calls for Assist With min Assistance/Cues.  Outcome: Not Progressing OOB without calling , on low bwd, bed alarm on, door left opened, room across nursing station  Problem: RH PAIN MANAGEMENT Goal: RH STG PAIN MANAGED AT OR BELOW PT'S PAIN GOAL Outcome: Progressing Denies pain  Problem: RH KNOWLEDGE DEFICIT BRAIN INJURY Goal: RH STG INCREASE KNOWLEDGE OF SELF CARE AFTER BRAIN INJURY Outcome: Not Progressing Does not have knowledge

## 2017-03-27 NOTE — Progress Notes (Signed)
  [facility] PHYSICAL MEDICINE & REHABILITATION     PROGRESS NOTE    Subjective/Complaints: Feels well. No complaints.  Denies pain. Eating well. Eager to go home  Objective: Vital Signs: Blood pressure 110/69, pulse 84, temperature 97.9 F (36.6 C), temperature source Oral, resp. rate 18, height 5\' 2"  (1.575 m), weight 134 lb (60.8 kg), SpO2 99 %.   Physical Exam:   well-developed well-nourished male in no acute distress. HEENT exam - has staples in scalp. Wound healing well. ,  neck supple without jugular venous distention. Chest clear to auscultation cardiac exam S1-S2 are regular. Abdominal exam overweight with bowel sounds, soft and nontender. Extremities no edema. Neurologic exam is alert  And moving all 4 extremities without difficulty.    Assessment/Plan: 1. Cognitive and functional deficits secondary to TBI Ok to go home today. Going home with sister Medical Problem List and Plan: 1.  Decreased functional mobility secondary to right intraparenchymal hemorrhage-SDH-temporal bone fracture after fall. Status post right frontotemporoparietal craniectomy 01/14/2017 with cranioplasty 03/19/2017  -ELOS 5/26  -family ed  -Patient to see MD in the office for transitional care encounter in 1-2 weeks. 2.  DVT Prophylaxis/Anticoagulation: SCDs. Patient is ambulatory 3. Pain Management: Hycet when necessary 4. Mood: Resume Ritalin 5 mg twice a day, Seroquel 12.5 mg daily at bedtime 5. Neuropsych: This patient is capable of making decisions on not own behalf. 6. Skin/Wound Care: Routine skin checks 7. Fluids/Electrolytes/Nutrition: Basic Metabolic Panel:    Component Value Date/Time   NA 139 03/23/2017 0550   K 3.9 03/23/2017 0550   CL 103 03/23/2017 0550   CO2 27 03/23/2017 0550   BUN 14 03/23/2017 0550   CREATININE 0.66 03/23/2017 0550   CREATININE 0.71 06/02/2016 1232   GLUCOSE 159 (H) 03/23/2017 0550   CALCIUM 9.0 03/23/2017 0550    8. Seizure prophylaxis. Keppra 500 mg  twice a day. 9. Dysphagia. Dysphagia #2 FEN liquids. Follow-up speech therapy 10. Urinary retention/Escherichia coli UTI. Urecholine 10 mg 3 times a day --d/c  - completed abx 11. Diabetes mellitus peripheral neuropathy. Hemoglobin A1c 10.4. Glucotrol 5 mg twice a day,  -   Glucophage at 500 mg daily(twice daily as prior to admission)  -off lantus   CBG (last 3)   Recent Labs  03/26/17 1650 03/26/17 2050 03/27/17 0647  GLUCAP 128* 117* 125*     12. Hypertension. Controlled. Low dose lisinopril 95/50-110/69  LOS (Days) 5 A FACE TO FACE EVALUATION WAS PERFORMED  Lindley MagnusBruce H Marcellene Shivley, MD 03/27/2017 8:01 AM

## 2017-04-01 ENCOUNTER — Telehealth: Payer: Self-pay | Admitting: *Deleted

## 2017-04-01 ENCOUNTER — Telehealth: Payer: Self-pay

## 2017-04-01 ENCOUNTER — Ambulatory Visit: Payer: Self-pay | Attending: Internal Medicine | Admitting: Physician Assistant

## 2017-04-01 VITALS — BP 114/75 | HR 89 | Temp 98.3°F | Resp 16 | Wt 132.4 lb

## 2017-04-01 DIAGNOSIS — B9620 Unspecified Escherichia coli [E. coli] as the cause of diseases classified elsewhere: Secondary | ICD-10-CM | POA: Insufficient documentation

## 2017-04-01 DIAGNOSIS — R1312 Dysphagia, oropharyngeal phase: Secondary | ICD-10-CM

## 2017-04-01 DIAGNOSIS — R339 Retention of urine, unspecified: Secondary | ICD-10-CM

## 2017-04-01 DIAGNOSIS — R451 Restlessness and agitation: Secondary | ICD-10-CM

## 2017-04-01 DIAGNOSIS — X58XXXS Exposure to other specified factors, sequela: Secondary | ICD-10-CM | POA: Insufficient documentation

## 2017-04-01 DIAGNOSIS — S0219XS Other fracture of base of skull, sequela: Secondary | ICD-10-CM | POA: Insufficient documentation

## 2017-04-01 DIAGNOSIS — E1165 Type 2 diabetes mellitus with hyperglycemia: Secondary | ICD-10-CM

## 2017-04-01 DIAGNOSIS — S06369D Traumatic hemorrhage of cerebrum, unspecified, with loss of consciousness of unspecified duration, subsequent encounter: Secondary | ICD-10-CM

## 2017-04-01 DIAGNOSIS — D649 Anemia, unspecified: Secondary | ICD-10-CM

## 2017-04-01 DIAGNOSIS — I10 Essential (primary) hypertension: Secondary | ICD-10-CM | POA: Insufficient documentation

## 2017-04-01 DIAGNOSIS — N390 Urinary tract infection, site not specified: Secondary | ICD-10-CM | POA: Insufficient documentation

## 2017-04-01 DIAGNOSIS — K5900 Constipation, unspecified: Secondary | ICD-10-CM | POA: Insufficient documentation

## 2017-04-01 DIAGNOSIS — M25511 Pain in right shoulder: Secondary | ICD-10-CM

## 2017-04-01 LAB — POCT URINALYSIS DIPSTICK
BILIRUBIN UA: NEGATIVE
GLUCOSE UA: NEGATIVE
Ketones, UA: NEGATIVE
Nitrite, UA: NEGATIVE
PH UA: 6.5 (ref 5.0–8.0)
RBC UA: NEGATIVE
SPEC GRAV UA: 1.015 (ref 1.010–1.025)
Urobilinogen, UA: 1 E.U./dL

## 2017-04-01 LAB — POCT GLYCOSYLATED HEMOGLOBIN (HGB A1C): HEMOGLOBIN A1C: 5.4

## 2017-04-01 LAB — GLUCOSE, POCT (MANUAL RESULT ENTRY): POC Glucose: 104 mg/dl — AB (ref 70–99)

## 2017-04-01 MED ORDER — METFORMIN HCL 1000 MG PO TABS: 1000.0000 mg | ORAL_TABLET | Freq: Two times a day (BID) | ORAL | 3 refills | Status: DC

## 2017-04-01 MED ORDER — LEVETIRACETAM 500 MG PO TABS: 500.0000 mg | ORAL_TABLET | Freq: Two times a day (BID) | ORAL | 1 refills | Status: DC

## 2017-04-01 MED ORDER — TRAMADOL HCL 50 MG PO TABS: 50.0000 mg | ORAL_TABLET | Freq: Four times a day (QID) | ORAL | 0 refills | Status: DC | PRN

## 2017-04-01 MED ORDER — QUETIAPINE FUMARATE 25 MG PO TABS: 12.5000 mg | ORAL_TABLET | Freq: Every evening | ORAL | 1 refills | Status: DC | PRN

## 2017-04-01 MED FILL — ?LEVETIRACETAM 500 MG TABLE: 500 | 30 days supply | Qty: 60 | Fill #0

## 2017-04-01 MED FILL — metFORMIN HCL 1000 MG TABS: 1000 | 30 days supply | Qty: 60 | Fill #0

## 2017-04-01 MED FILL — ?QUETIAPINE FUMARATE 25 MG: 25 MG | 30 days supply | Qty: 15 | Fill #0

## 2017-04-01 NOTE — Telephone Encounter (Signed)
Allen DerryAllison Mallory, [facility] left message asking for verbal orders to address swallowing 1week3.  Verbal orders given per office protocol

## 2017-04-01 NOTE — Progress Notes (Signed)
[address] Sheppard Plumber, is a 51 y.o. male  ZOX:[id number]  WJX:[id number]  DOB - [birth year]  Subjective:  Chief Complaint and HPI: [name] is a 51 y.o. male here today to establish care and for a follow up visit After extensive injuries and hospitalization 01/10/2017-02/01/2017 and subsequent hospitalization 5/18-5/21/2018.  After each of those hospital stays, he also had inpatient rehab.  He has had multiple procedures and surgical intervention due to being found unresponsive and taken to the hospital and found to have an intracranial bleed on 01/10/2017.  He had multiple complications including urinary retention and UTI as well as pneumonia.  He has not returned to his pre-admission quality of life.  He is staying with his sister and her family currently.    He is here today to establish care and for medical management.  He is seeing Dr Yetta Barre for neurosurgery and Dr Riley Kill for physical rehabilitation medicine.  He is feeling tired overall but is improving.  There are times he is feeling weak and his sister is concerned about hypoglycemia.  She does have a glucometer at home.  He feels lethargic.  Also c/o R shoulder pain after falling backwards the other day from a seated position.  Blood sugars running about 100.  He is able to complete most ADL as long as his sister reminds him of doing them.  He needs assistance with walking.  From hospital discharge: HPI: Pt is a 51 yo M who was found down. He was initially worked up as a medical admission with a low grade fever and mental status changes. Once his family found him, more information came to light. He was at a friend's house and was drinking. He was assumed to be somewhere sleeping it off. The morning they found him near the bottom of a staircase and they couldn't wake him up. He was assumed to have fallen. He was unable to give any history. Pt was GCS 8 upon arrival to the ED but was maintaining his airway so intubation  was not performed. Work up revealed intracranial hemorrhage and intraparenchymal hemorrhage, right subdural hematoma with right left midline shift, nondisplaced vertical fracture through the left temporal bone, parallel nondisplaced horizontally oriented skull fractures along the left lateral convexity tracking anteriorly to the greater wing of the left sphenoid, fracture through the left mastoid bone and air cells with fluid/blood in the left middle ear, mastoids, and EAC.   Hospital Course: Pt was intubated and admitted to the ICU. Neurosurgery was consulted and suggested frequent neuro checks and ICP monitoring and a bolt was placed on 3/12. Tube feeds were started. ENT was consulted and they suggested no surgical intervention. On 3/14 pt was noted to have a fever and was started on empiric Zosyn. On 3/14 CT head shows increased right to left shift. On 3/15 Dr. Coletta Memos, took pt to the OR for 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, Placement of bone flap in abdomen for right frontal temporalIntracerebral hematomas. Pt was found to have staph aureus pneumonia which was treated. Also had ABL anemia but Hg remained stable. Pt was extubated on 3/20. Pt pulled corTrak on 3/22 and it was replaced. Foley was removed on 3/27. On 3/28 pt had blood in his urine but cleared the following day. UA revealed E. Coli. Pt was started on Cipro. By 4/2 pt was improving with therapies, was speaking, VSS, pain controlled, and felt stable for discharge to inpatient rehab.  He was then in  inpatient rehab from 02/01/2017-03/19/2017: From inpatient rehab note:  DISCHARGE DIAGNOSES: 1. Right intraparenchymal hemorrhage - subdural hematoma - temporal     bone fracture after fall, status post right frontotemporoparietal     craniectomy January 14, 2017. 2. Subcutaneous heparin for deep venous thrombosis prophylaxis. 3. Mood. 4. Seizure prophylaxis. 5. Pneumonia. 6. Dysphagia. 7. Urinary  retention with Escherichia coli urinary tract infection. 8. Diabetes mellitus. 9. History of hypertension. 10.Constipation.  HISTORY OF PRESENT ILLNESS:  This is a 51 year old right-handed, limited English-speaking male, lives with a sister, independent prior to admission.  Presented January 10, 2017, after being found down from a supposed fall.  CT of the head and imaging revealed intracranial hemorrhage with a 4.5 cm right frontal intraparenchymal hemorrhage, 6 mm right subdural hematoma, and other scattered areas of intraparenchymal and subarachnoid hemorrhage with right-to-left midline shift. Nondisplaced vertical fracture through the left temporal bone.  CT cervical spine negative.  Urine drug screen negative.  Neurosurgery consulted initially with conservative care.  ICP monitor.  ENT, Dr. Jenne Pane, for temporal bone fracture.  No surgical intervention.  CT imaging showed increased right to left greater than 1 cm shift, basal cisterns slightly effaced.  Underwent right frontotemporoparietal craniectomy for hematoma evacuation January 14, 2017, per Dr. Franky Macho with placement of bone flap into the abdomen.  Keppra for seizure prophylaxis.  Extubated January 19, 2017, nasogastric tube for nutritional support.  Subcutaneous heparin for DVT prophylaxis later initiated. Bouts of urinary retention as well as E. coli urinary tract infection, placed on Cipro.  Physical and occupational therapy ongoing.  The patient was admitted for a comprehensive rehab program.  PAST MEDICAL HISTORY:  See discharge diagnoses.  SOCIAL HISTORY:  Lives with sister, independent prior to admission.  FUNCTIONAL STATUS UPON ADMISSION TO REHAB SERVICES:  Total assist, sit to supine, supine to sit.  Max total assist activities of daily living.  PHYSICAL EXAMINATION:  VITAL SIGNS:  Blood pressure 128/77, pulse 85, temperature 98, and respirations 19. GENERAL:  This was a restless male, moving all extremities.  He  did have some spontaneous speech, primarily [place].  He was able to say his name. HEENT:  Pupils round and reactive to light.  Craniotomy site clean and dry.  Nasogastric tube in place. LUNGS:  Clear to auscultation without wheeze. CARDIAC:  Regular rate and rhythm. ABDOMEN:  Soft, nontender.  Good bowel sounds.  REHABILITATION HOSPITAL COURSE:  The patient was admitted to inpatient rehab services with therapies initiated on a 3-hour daily basis, consisting of physical therapy, occupational therapy, speech therapy, and rehabilitation nursing.  Following issues were addressed during the patient's rehabilitation stay.  Pertaining to [name] intraparenchymal hemorrhage, subdural hematoma with temporal bone fracture, he had undergone right frontotemporoparietal craniectomy January 14, 2017, placement of bone flap in the abdomen.  Plan was for cranioplasty, the patient to be discharged to acute care services, Dr. Marikay Alar, Mar 19, 2017, for planned procedure.  He had been on subcutaneous heparin for DVT prophylaxis, no bleeding episodes.  He continued on Keppra for seizure prophylaxis as well as initiation of Ritalin to help the patient focus to tasks and improved awareness.  His diet had been advanced to a dysphagia 2 thin liquid diet.  He completed a course of Cipro for E. coli urinary tract infection as well as voiding much improved.  His Urecholine had been discontinued.  Blood sugars overall controlled, monitored hemoglobin A1c of 10.4.  Attempts to wean his insulin maintained oral agents.  Blood pressures well  controlled. The patient received weekly collaborative interdisciplinary team conferences to discuss estimated length of stay, family teaching, any barriers to his discharge.  Minimal assist for ambulation with cuing. Up and down stairs.  Moderate cues, moderate assist for patient placing left upper extremity on prior to admission providing max verbal cues  for sequencing, ambulating up to 200 feet.  He did have a helmet in place for protection.  Speech therapy continued to follow with the assistance of an interpreter.  He was able to communicate his basic needs and express himself, although needing ongoing monitoring for his safety. Plan was to be discharged to acute care services Mar 19, 2017, for cranioplasty.  He could be readmitted back to inpatient rehab Services once medically stable as needed.  DISCHARGE MEDICATIONS:  Colace 100 mg p.o. daily, Glucotrol 5 mg p.o. b.i.d., Lantus insulin 10 units at bedtime, Keppra 500 mg p.o. b.i.d., Ritalin 10 mg p.o. b.i.d., Seroquel 12.5 mg p.o. at bedtime, and hydrocodone 1 tablet p.o. every 6 hours as needed pain.  He was then re-admitted 03/19/2017-03/22/2017 for cranioplasty.   D/C summary: Hospital Course: The patient was admitted on 03/19/2017 and taken to the operating room where the patient underwent  cranioplasty. The patient tolerated the procedure well and was taken to the recovery room and then to the  ICU in stable condition. The hospital course was routine. There were no complications other than urinary retention requiring Foley catheter placement. The wound remained clean dry and intact. Pt had appropriate  head soreness. No complaints of  new N/T/W. The patient remained afebrile with stable vital signs, and tolerated a regular diet. The patient continued to increase activities, and pain was well controlled with oral pain medications.   He then went back to inpatient rehab 03/22/2017-03/27/2017:  REHABILITATION HOSPITAL COURSE:  The patient was admitted to inpatient rehab services with therapies initiated on a 3-hour daily basis, consisting of physical therapy, occupational therapy, speech therapy, and rehabilitation nursing.  The following issues were addressed during the patient's rehabilitation stay.  Pertaining to Mr. Felipa Furnace intraparenchymal hemorrhage, subdural hematoma, he had  undergone craniectomy, cranioplasty, latest being Mar 19, 2017.  He would follow up with Neurosurgery, Dr. Marikay Alar.  Surgical site healing nicely. SCDs for DVT prophylaxis.  The patient was ambulatory.  He was using Hycet, changed to Ultram for pain control.  He continued on Ritalin for mood stimulation, improved awareness.  His Seroquel was discontinued. Keppra for seizure prophylaxis.  His blood sugars were normalizing.  He continued on Glucophage as well as Glucotrol.  Full diabetic teaching. He was off insulin therapy.  Blood pressures monitored, on no current antihypertensive medications.  His diet had been advanced to a dysphagia #2 thin liquid diet.  The patient received weekly collaborative interdisciplinary team conferences to discuss estimated length of stay, family teaching, any barriers to discharge.  Sessions focused on addressing functional mobility, cognitive remediation, overall endurance.  The patient performed functional mobility at overall supervision to minimal assist level including basic transfers, toilet transfers, toileting, ambulation without device, and picking up items from the floor.  Engaged in cognitive activity during mobility to identify and match colored items.  The patient was assisted by an interpreter.  He could gather belongings for activities of daily living and homemaking, ambulate to the day room for his breakfast tray, required supervision with moderate cues for his meals.  He was able to communicate his needs through an interpreter and his family.  Good social interaction.  Again, stressed  the family need for overall safety control with multiple family conferences held with his family and discharge planning.  He was discharged to home Mar 27, 2017.  DISCHARGE MEDICATIONS:  Urecholine 10 mg p.o. t.i.d. taper as directed, Colace 100 mg p.o. daily, Glucotrol 5 mg p.o. b.i.d., Keppra 500 mg p.o. b.i.d., Glucophage 500 mg p.o. daily, Ritalin 5 mg  p.o. b.i.d., multivitamin daily, Ultram 50 mg p.o. every 6 hours as needed pain.  FOLLOWUP:  The patient would follow up with Dr. Faith Rogue at the outpatient rehab service office as directed; Dr. Marikay Alar, call for appointment.  [facility] appointment to be made. DIET:  His diet was a dysphagia 2 thin liquid diet. Marland Kitchen   ED/Hospital notes reviewed.    ROS:   Constitutional:  No f/c, No night sweats, No unexplained weight loss. EENT:  No vision changes, No blurry vision, No hearing changes. No mouth, throat, or ear problems.  Respiratory: No cough, No SOB Cardiac: No CP, no palpitations GI:  No abd pain, No N/V/D. GU: No Urinary s/sx Musculoskeletal: No joint pain Neuro: No headache, no dizziness, no motor weakness.  Skin: No rash Endocrine:  No polydipsia. No polyuria.  Psych: Denies SI/HI  No problems updated.  ALLERGIES: Allergies  Allergen Reactions  . No Known Allergies     PAST MEDICAL HISTORY: Past Medical History:  Diagnosis Date  . Diabetes mellitus without complication (HCC)     MEDICATIONS AT HOME: Prior to Admission medications   Medication Sig Start Date End Date Taking? Authorizing Provider  levETIRAcetam (KEPPRA) 500 MG tablet Take 1 tablet (500 mg total) by mouth 2 (two) times daily. 04/01/17  Yes McClung, Angela M, PA-C  QUEtiapine (SEROQUEL) 25 MG tablet Take 0.5 tablets (12.5 mg total) by mouth at bedtime as needed (insomnia agitation). 04/01/17  Yes McClung, Marzella Schlein, PA-C  traMADol (ULTRAM) 50 MG tablet Take 1 tablet (50 mg total) by mouth every 6 (six) hours as needed for severe pain. 04/01/17  Yes Georgian Co M, PA-C  metFORMIN (GLUCOPHAGE) 1000 MG tablet Take 1 tablet (1,000 mg total) by mouth 2 (two) times daily with a meal. 04/01/17   McClung, Marzella Schlein, PA-C  Multiple Vitamin (MULTIVITAMIN) LIQD Place 15 mLs into feeding tube daily. Patient not taking: Reported on 04/01/2017 02/02/17   Jerre Simon, PA     Objective:   EXAM:   Vitals:   04/01/17 1401 04/01/17 1413  BP: 103/69 114/75  Pulse: 93 89  Resp: 16   Temp: 98.3 F (36.8 C)   TempSrc: Oral   SpO2: 98% 96%  Weight: 132 lb 6.4 oz (60.1 kg)     General appearance : A&OX3. NAD. Non-toxic-appearing,  Large incision along top and R side of scalp healing well with staples in place. Abnormal gait Neck: supple, no JVD. No cervical lymphadenopathy. No thyromegaly Chest/Lungs:  Breathing-non-labored, Good air entry bilaterally, breath sounds normal without rales, rhonchi, or wheezing  CVS: S1 S2 regular, no murmurs, gallops, rubs  Abdomen: Bowel sounds present, Non tender and not distended with no gaurding, rigidity or rebound. Extremities: Bilateral Lower Ext shows no edema, both legs are warm to touch with = pulse throughout.  R shoulder with full S&ROM.  TTP along insertion of biceps tendon.  Joint itself is stable.  No ecchymosis of the overlying skin.   Neurology:  Gait is abnormal and requires assisitance.  Some weakness and rigidity in LUE.    Psych:  TP linear. J/I WNL.  Normal speech. Appropriate eye contact and affect.  Skin:  No Rash  Data Review Lab Results  Component Value Date   HGBA1C 5.4 04/01/2017   HGBA1C 10.4 (H) 01/10/2017   HGBA1C 10.9 06/02/2016     Assessment & Plan   S/P intracranial bleed, multiple surgeries/procedures/ and lengthy hospitalization and inpatient rehab: 1. Dysphagia, oropharyngeal resolved  2. Uncontrolled type 2 diabetes mellitus with hyperglycemia, without long-term current use of insulin (HCC) Controlled now-concern for hypoglycemia Stop glipizide and increase dose of metformin from 500mg  daily-titrate to 1000mg  bid - POCT glucose (manual entry) - POCT glycosylated hemoglobin (Hb A1C) - Comprehensive metabolic panel - metFORMIN (GLUCOPHAGE) 1000 MG tablet; Take 1 tablet (1,000 mg total) by mouth 2 (two) times daily with a meal.  Dispense: 180 tablet; Refill: 3  3. Traumatic hemorrhage of  cerebrum with loss of consciousness, unspecified laterality, subsequent encounter F/up Dr. Yetta BarreJones - levETIRAcetam (KEPPRA) 500 MG tablet; Take 1 tablet (500 mg total) by mouth 2 (two) times daily.  Dispense: 60 tablet; Refill: 1  4. Urinary retention resolved - POCT urinalysis dipstick  5. Benign essential HTN Not on meds-controlled  6. Anemia, unspecified type - CBC with Differential/Platelet  7. Acute pain of right shoulder - traMADol (ULTRAM) 50 MG tablet; Take 1 tablet (50 mg total) by mouth every 6 (six) hours as needed for severe pain.  Dispense: 40 tablet; Refill: 0  9. Agitation-continue - QUEtiapine (SEROQUEL) 25 MG tablet; Take 0.5 tablets (12.5 mg total) by mouth at bedtime as needed (insomnia agitation).  Dispense: 30 tablet; Refill: 1  Patient have been counseled extensively about nutrition and exercise.  Spent >35 mins face to face in addition to 45 additional mins reviewing case PTA.    Return in about 2 weeks (around 04/15/2017) for assign PCP and f/up DM and recent intracranial bleed secondary to trauma.  Make f/up appt with Dr Riley KillSwartz and Dr Yetta BarreJones.  The patient was given clear instructions to go to ER or return to medical center if symptoms don't improve, worsen or new problems develop. The patient verbalized understanding. The patient was told to call to get lab results if they haven't heard anything in the next week.     Georgian CoAngela McClung, PA-C [facility] [place], [place] [phone]   04/01/2017, 2:28 PM

## 2017-04-01 NOTE — Patient Instructions (Addendum)
Check fasting blood sugars daily and record and bring to next visit.  Make appointments with Dr.  Riley KillSwartz and Dr. Yetta BarreJones

## 2017-04-01 NOTE — Telephone Encounter (Signed)
[facility] [phone]) requesting verbal orders for PT 2xwk X 4wks, verbal orders were authorized.

## 2017-04-02 ENCOUNTER — Telehealth: Payer: Self-pay

## 2017-04-02 ENCOUNTER — Encounter (HOSPITAL_COMMUNITY): Payer: Self-pay | Admitting: Emergency Medicine

## 2017-04-02 ENCOUNTER — Emergency Department (HOSPITAL_COMMUNITY): Payer: Self-pay

## 2017-04-02 ENCOUNTER — Emergency Department (HOSPITAL_COMMUNITY)
Admission: EM | Admit: 2017-04-02 | Discharge: 2017-04-02 | Disposition: A | Discharge status: Home / Self Care | Payer: Self-pay | Attending: Physician Assistant | Admitting: Physician Assistant

## 2017-04-02 DIAGNOSIS — Z7984 Long term (current) use of oral hypoglycemic drugs: Secondary | ICD-10-CM | POA: Insufficient documentation

## 2017-04-02 DIAGNOSIS — R410 Disorientation, unspecified: Secondary | ICD-10-CM | POA: Insufficient documentation

## 2017-04-02 DIAGNOSIS — I10 Essential (primary) hypertension: Secondary | ICD-10-CM | POA: Insufficient documentation

## 2017-04-02 DIAGNOSIS — E1140 Type 2 diabetes mellitus with diabetic neuropathy, unspecified: Secondary | ICD-10-CM | POA: Insufficient documentation

## 2017-04-02 LAB — CBC WITH DIFFERENTIAL/PLATELET
BASOS ABS: 0 10*3/uL (ref 0.0–0.1)
BASOS: 1 %
Basophils Absolute: 0 10*3/uL (ref 0.0–0.2)
Basophils Relative: 0 %
EOS (ABSOLUTE): 0.1 10*3/uL (ref 0.0–0.4)
EOS ABS: 0.1 10*3/uL (ref 0.0–0.7)
EOS PCT: 1 %
EOS: 3 %
HCT: 33.2 % — ABNORMAL LOW (ref 39.0–52.0)
HEMATOCRIT: 33.1 % — AB (ref 37.5–51.0)
Hemoglobin: 11.1 g/dL — ABNORMAL LOW (ref 13.0–17.7)
Hemoglobin: 11.5 g/dL — ABNORMAL LOW (ref 13.0–17.0)
Immature Grans (Abs): 0 10*3/uL (ref 0.0–0.1)
Immature Granulocytes: 0 %
LYMPHS ABS: 1.4 10*3/uL (ref 0.7–3.1)
LYMPHS ABS: 1.3 10*3/uL (ref 0.7–4.0)
Lymphocytes Relative: 23 %
Lymphs: 31 %
MCH: 28.2 pg (ref 26.6–33.0)
MCH: 28.1 pg (ref 26.0–34.0)
MCHC: 33.5 g/dL (ref 31.5–35.7)
MCHC: 34.6 g/dL (ref 30.0–36.0)
MCV: 84 fL (ref 79–97)
MCV: 81.2 fL (ref 78.0–100.0)
MONOS ABS: 0.2 10*3/uL (ref 0.1–0.9)
Monocytes Absolute: 0.2 10*3/uL (ref 0.1–1.0)
Monocytes Relative: 3 %
Monocytes: 5 %
NEUTROS ABS: 2.7 10*3/uL (ref 1.4–7.0)
Neutro Abs: 4.3 10*3/uL (ref 1.7–7.7)
Neutrophils Relative %: 73 %
Neutrophils: 60 %
PLATELETS: 291 10*3/uL (ref 150–400)
Platelets: 287 10*3/uL (ref 150–379)
RBC: 3.94 x10E6/uL — ABNORMAL LOW (ref 4.14–5.80)
RBC: 4.09 MIL/uL — AB (ref 4.22–5.81)
RDW: 15.2 % (ref 12.3–15.4)
RDW: 13 % (ref 11.5–15.5)
WBC: 4.5 10*3/uL (ref 3.4–10.8)
WBC: 5.8 10*3/uL (ref 4.0–10.5)

## 2017-04-02 LAB — COMPREHENSIVE METABOLIC PANEL
A/G RATIO: 1.5 (ref 1.2–2.2)
ALBUMIN: 4.1 g/dL (ref 3.5–5.5)
ALK PHOS: 51 IU/L (ref 39–117)
ALT: 13 IU/L (ref 0–44)
ALT: 15 U/L — AB (ref 17–63)
ANION GAP: 8 (ref 5–15)
AST: 11 IU/L (ref 0–40)
AST: 15 U/L (ref 15–41)
Albumin: 3.6 g/dL (ref 3.5–5.0)
Alkaline Phosphatase: 44 U/L (ref 38–126)
BUN / CREAT RATIO: 18 (ref 9–20)
BUN: 13 mg/dL (ref 6–24)
BUN: 12 mg/dL (ref 6–20)
Bilirubin Total: 0.2 mg/dL (ref 0.0–1.2)
CHLORIDE: 105 mmol/L (ref 101–111)
CO2: 25 mmol/L (ref 18–29)
CO2: 23 mmol/L (ref 22–32)
CREATININE: 0.67 mg/dL (ref 0.61–1.24)
Calcium: 9.3 mg/dL (ref 8.7–10.2)
Calcium: 8.9 mg/dL (ref 8.9–10.3)
Chloride: 102 mmol/L (ref 96–106)
Creatinine, Ser: 0.72 mg/dL — ABNORMAL LOW (ref 0.76–1.27)
GFR calc Af Amer: 125 mL/min/{1.73_m2} (ref >59)
GFR calc non Af Amer: 108 mL/min/{1.73_m2} (ref >59)
GLOBULIN, TOTAL: 2.7 g/dL (ref 1.5–4.5)
Glucose, Bld: 104 mg/dL — ABNORMAL HIGH (ref 65–99)
Glucose: 98 mg/dL (ref 65–99)
POTASSIUM: 4.2 mmol/L (ref 3.5–5.2)
POTASSIUM: 3.8 mmol/L (ref 3.5–5.1)
SODIUM: 139 mmol/L (ref 134–144)
Sodium: 136 mmol/L (ref 135–145)
Total Bilirubin: 0.5 mg/dL (ref 0.3–1.2)
Total Protein: 6.8 g/dL (ref 6.0–8.5)
Total Protein: 7.1 g/dL (ref 6.5–8.1)

## 2017-04-02 LAB — URINALYSIS, ROUTINE W REFLEX MICROSCOPIC
Bilirubin Urine: NEGATIVE
GLUCOSE, UA: NEGATIVE mg/dL
Hgb urine dipstick: NEGATIVE
KETONES UR: NEGATIVE mg/dL
Nitrite: NEGATIVE
PH: 6 (ref 5.0–8.0)
Protein, ur: NEGATIVE mg/dL
SQUAMOUS EPITHELIAL / LPF: NONE SEEN
Specific Gravity, Urine: 1.013 (ref 1.005–1.030)

## 2017-04-02 LAB — AMMONIA: Ammonia: 31 umol/L (ref 9–35)

## 2017-04-02 LAB — I-STAT TROPONIN, ED: TROPONIN I, POC: 0 ng/mL (ref 0.00–0.08)

## 2017-04-02 LAB — PROTIME-INR
INR: 1.04
PROTHROMBIN TIME: 13.6 s (ref 11.4–15.2)

## 2017-04-02 LAB — CBG MONITORING, ED: GLUCOSE-CAPILLARY: 90 mg/dL (ref 65–99)

## 2017-04-02 MED ORDER — PREDNISONE 20 MG PO TABS
60.0000 mg | ORAL_TABLET | Freq: Once | ORAL | Status: AC
  Administered 2017-04-02: 60 mg via ORAL
  Filled 2017-04-02: qty 3

## 2017-04-02 MED ORDER — PREDNISONE 20 MG PO TABS: 40.0000 mg | ORAL_TABLET | Freq: Every day | ORAL | 0 refills | Status: DC

## 2017-04-02 NOTE — ED Notes (Signed)
[name] [phone]

## 2017-04-02 NOTE — Telephone Encounter (Addendum)
Nurse called [phone] stating patient has made accusations intent on hurting himself during her visit, wants call back, unsure to call ambulance for admitting or what she needs to do in this situation

## 2017-04-02 NOTE — Telephone Encounter (Signed)
Pt is cognitively impaired due to his brain injury. He has not tried to carry these ideations out because of his severe cognitive impairment. Recommend supervision of family and distraction to another topic

## 2017-04-02 NOTE — ED Notes (Signed)
Pt is very agitated, states repeatedly he wants to go home.

## 2017-04-02 NOTE — ED Triage Notes (Signed)
Per ems, pt from home, three months ago had brain bleed due to fall, today home health nurse came out to pt home, noticing pt "wasn't acting right", talking about killing himself, using a car or poison to hurt himself. Neurology called from home health nurse, concerned for second brain bleed. Per ems, pt back to normal baseline. AAOX3, time place and name.. Limited english. Per ems, left sided weakness from previous bleed and difficulty ambulating.

## 2017-04-02 NOTE — Discharge Instructions (Addendum)
As discussed, it is very important that you follow-up with both your neurosurgeon, and a counselor. Please discuss this with her social worker, and if possible arrange for conversation regarding your current condition.  Return here for concerning changes in your condition.

## 2017-04-02 NOTE — ED Notes (Signed)
Pt found in bed with IV pulled out, leads pulled off, pt refusimng to allow nurse to take vitals

## 2017-04-02 NOTE — ED Notes (Signed)
Pt refusing assistance on all counts from this EMT

## 2017-04-02 NOTE — ED Provider Notes (Addendum)
[facility]PT Provider Note   CSN: [id number] Arrival date & time: 04/02/17  1253     History   Chief Complaint Chief Complaint  Patient presents with  . Altered Mental Status    HPI [name] is a 51 y.o. male.  HPI   Patient is a 51 year old male with past medical history significant for traumatic brain injury, seizures, status post craniotomy, dysphasia, essential hypertension and diabetes. He is presenting today with suicidality. Patient's been increasingly aggressive at home. 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 Patient tried to jump out of a moving car. Patient said he feels suicidal like he would drink any liquid in the house in order to kill himself.  Past Medical History:  Diagnosis Date  . Diabetes mellitus without complication [facility])     Patient Active Problem List   Diagnosis Date Noted  . Post-operative pain   . Agitation   . Attention and concentration deficit   . Seizure prophylaxis   . Dysphagia   . Urinary retention   . Type 2 diabetes mellitus with peripheral neuropathy (HCC)   . S/P craniotomy 03/19/2017  . Essential hypertension   . Agitated   . Lethargy   . Benign essential HTN   . Poorly controlled type 2 diabetes mellitus with peripheral neuropathy (HCC)   . 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 Aphasia 02/01/2017  . Dysphagia, oropharyngeal 02/01/2017  . Intracerebral hemorrhage (HCC) 01/14/2017  . Fall 01/10/2017  . Subdural hemorrhage (HCC) 01/10/2017  . Diabetes type 2, uncontrolled (HCC) 06/13/2015    Past Surgical History:  Procedure Laterality Date  . CRANIOTOMY Right 01/13/2017   Procedure: 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, Placement of bone flap in abdomen;  Surgeon: Coletta Memos, MD;  Location: [facility] OR;  Service: Neurosurgery;  Laterality: Right;  . CRANIOTOMY Right 03/19/2017   Procedure: Cranioplasty with  placement of bone flap from abdomen;  Surgeon: Tia Alert, MD;  Location: [facility] OR;  Service: Neurosurgery;  Laterality: Right;       Home Medications    Prior to Admission medications   Medication Sig Start Date End Date Taking? Authorizing Provider  acetaminophen (TYLENOL) 500 MG tablet Take 500 mg by mouth every 6 (six) hours as needed for moderate pain.   Yes [provider]  glipiZIDE (GLUCOTROL) 5 MG tablet Take 5 mg by mouth daily before breakfast.   Yes [provider]  levETIRAcetam (KEPPRA) 500 MG tablet Take 1 tablet (500 mg total) by mouth 2 (two) times daily. 04/01/17  Yes McClung, Angela M, PA-C  metFORMIN (GLUCOPHAGE) 1000 MG tablet Take 1 tablet (1,000 mg total) by mouth 2 (two) times daily with a meal. Patient taking differently: Take 500 mg by mouth 2 (two) times daily with a meal.  04/01/17  Yes McClung, Angela M, PA-C  traMADol (ULTRAM) 50 MG tablet Take 1 tablet (50 mg total) by mouth every 6 (six) hours as needed for severe pain. 04/01/17  Yes Anders Simmonds, PA-C  methylphenidate (RITALIN) 5 MG tablet Take 5 mg by mouth 2 (two) times daily.  03/29/17   [provider]  Multiple Vitamin (MULTIVITAMIN) LIQD Place 15 mLs into feeding tube daily. Patient not taking: Reported on 04/01/2017 02/02/17   Jerre Simon, PA  QUEtiapine (SEROQUEL) 25 MG tablet Take 0.5 tablets (12.5 mg total) by mouth at bedtime as needed (insomnia agitation). Patient not  taking: Reported on 04/02/2017 04/01/17   Anders Simmonds, PA-C    Family History Family History  Problem Relation Age of Onset  . Diabetes Mother   . Diabetes Father     Social History Social History  Substance Use Topics  . Smoking status: Never Smoker  . Smokeless tobacco: Never Used  . Alcohol use Yes     Comment: socially, not since accident     Allergies   No known allergies   Review of Systems Review of Systems  Constitutional: Negative for activity change.  Respiratory:  Negative for shortness of breath.   Cardiovascular: Negative for chest pain.  Gastrointestinal: Negative for abdominal pain.  Psychiatric/Behavioral: Positive for behavioral problems and self-injury.     Physical Exam Updated Vital Signs BP 108/64   Pulse 75   Temp 98.3 F (36.8 C) (Oral)   Resp 16   SpO2 100%   Physical Exam  Constitutional: He appears well-developed and well-nourished.  HENT:  Head: Normocephalic.  Old laceration to head  Eyes: Conjunctivae are normal.  Neck: Neck supple.  Cardiovascular: Normal rate and regular rhythm.   No murmur heard. Pulmonary/Chest: Effort normal and breath sounds normal. No respiratory distress.  Abdominal: Soft. There is no tenderness.  Musculoskeletal: He exhibits no edema.  Neurological: He is alert.  Skin: Skin is warm and dry.  Nursing note and vitals reviewed.    ED Treatments / Results  Labs (all labs ordered are listed, but only abnormal results are displayed) Labs Reviewed  CBC WITH DIFFERENTIAL/PLATELET - Abnormal; Notable for the following:       Result Value   RBC 4.09 (*)    Hemoglobin 11.5 (*)    HCT 33.2 (*)    All other components within normal limits  COMPREHENSIVE METABOLIC PANEL - Abnormal; Notable for the following:    Glucose, Bld 104 (*)    ALT 15 (*)    All other components within normal limits  AMMONIA  PROTIME-INR  URINALYSIS, ROUTINE W REFLEX MICROSCOPIC  CBG MONITORING, ED  I-STAT TROPOININ, ED    EKG  EKG Interpretation None       Radiology Dg Chest 2 View  Result Date: 04/02/2017 CLINICAL DATA:  Patient with history of brain bleed from fall. EXAM: CHEST  2 VIEW COMPARISON:  Chest radiograph 03/22/2017 FINDINGS: Monitoring leads overlie the patient. Stable cardiac and mediastinal contours. No consolidative pulmonary opacities. No pleural effusion or pneumothorax. Thoracic spine degenerative changes. IMPRESSION: No acute cardiopulmonary process.  Basilar atelectasis. Electronically  Signed   By: Annia Belt M.D.   On: 04/02/2017 14:33   Ct Head Wo Contrast  Result Date: 04/02/2017 CLINICAL DATA:  Fall. History of traumatic brain injury. Status post cranioplasty with bone flap placement 03/19/2017. EXAM: CT HEAD WITHOUT CONTRAST TECHNIQUE: Contiguous axial images were obtained from the base of the skull through the vertex without intravenous contrast. COMPARISON:  03/19/2017 head CT. FINDINGS: Brain: Interval resolution of the right extra-axial space pneumocephalus seen on the prior head CT. Stable right frontotemporal encephalomalacia. Mixed attenuation right frontal extra-axial collection deep to the craniotomy flap measures 6 mm thickness, mildly increased from 4 mm on 03/19/2017. No evidence of acute parenchymal hemorrhage. No left convexity extra-axial collection. Mild local anterior frontal sulcal effacement. No mass lesion or midline shift. No CT evidence of acute infarction. Stable ventricles with no evidence of acute hydrocephalus. Vascular: No acute abnormality. Skull: Status post right frontoparietal craniotomy, which is stable in appearance with no interval malalignment of the  craniotomy flap. Scalp skin staples overlie the craniotomy site. Interval resolution of the scalp emphysema seen on the prior head CT. Sinuses/Orbits: No fluid levels. Mild mucoperiosteal thickening in the bilateral paranasal sinuses. Other: Stable partial left mastoid effusion. Clear right mastoid air cells. IMPRESSION: 1. Mixed attenuation right frontal extra-axial collection deep to the craniotomy flap is mildly increased in thickness since 03/19/2017 head CT. Mild local anterior frontal sulcal effacement. No midline shift. 2. Resolved postoperative right scalp emphysema and right extra-axial pneumocephalus. 3. Stable right frontotemporal encephalomalacia. 4. Stable partial left mastoid effusion. Electronically Signed   By: Delbert PhenixJason A Poff M.D.   On: 04/02/2017 15:30    Procedures Procedures (including  critical care time)  Medications Ordered in ED Medications - No data to display   Initial Impression / Assessment and Plan / ED Course  I have reviewed the triage vital signs and the nursing notes.  Pertinent labs & imaging results that were available during my care of the patient were reviewed by me and considered in my medical decision making (see chart for details).     Patient is a 51 year old male status posttraumatic brain injury presenting with increasingly aggressive behavior. We'll do CT to rule out any kind of new bleed. We'll do labs to make sure that patient does not have a lot of abnormality causing increase in her symptoms. We'll then plan to have TTS consultation for the SI/agression earlier today.   4:50 PM CT shows mild attentuation different than 5/18. Suspect this is normal. Will touch base with neurosurgery.  Will have TTS make sure they agree with me that patient is no longer SI.  Note appeared from Dr. Riley KillSwartz (who knows pateitn well) that he is likely safe at hoem.    Final Clinical Impressions(s) / ED Diagnoses   Final diagnoses:  None    New Prescriptions New Prescriptions   No medications on file     Abelino DerrickMackuen, Ethin Drummond Lyn, MD 04/02/17 1620    Abelino DerrickMackuen, [place] Lyn, MD 04/02/17 1650

## 2017-04-02 NOTE — ED Notes (Signed)
Pt back from xray. Urinal at bedside.

## 2017-04-02 NOTE — ED Notes (Signed)
Vinnie PA for Dr. Bevely Palmeritty is aware of pt and will be down soon to see pt.

## 2017-04-02 NOTE — Telephone Encounter (Signed)
Called nurse back she states she has already called 911 and has police and EMS already on the scene, states that for a few weeks patient has seemed to become increasingly aggressive toward friends/family and whoever is near the patient.

## 2017-04-02 NOTE — ED Triage Notes (Signed)
Per family member, pt has left sided weakness from previous brain bleed, pt remembers telling nurse he wanted to take poison or jump in front of a car. Pt was aggressive to home health nurse. Sister states patient is back to baseline and he is denying feeling suicidal. Pt is calm and cooperative.

## 2017-04-02 NOTE — ED Provider Notes (Addendum)
I was called to the patient's room due to his request for discharge. I relayed the concern of suicidal ideation. The patient states that he has no intent of killing himself, offers his word, states that he will go home, rest, does not want, himself.    Gerhard MunchLockwood, Krithi Bray, MD 04/02/17 1746  7:15 PM Patient sister now present. She lives with the patient, notes that she is comfortable taking him home. She requests evaluation of his shoulder, states that it has hurt since a minor fall that occurred at some point in the last week or so. On exam the patient moves the shoulder freely, both against resistance and passively. There is some anterior rotator cuff tenderness, impingement sign, and I discussed with the patient and his sister. Patient cannot take NSAIDs due to prior bleed, will be started on a short course of steroids, as he is very taking Tylenol, other analgesia. Patient again confirms that he has no suicidal thoughts, and with the sister's comfort taking the patient home, he was discharged, per request, will follow up with psychiatry closely as an outpatient.    Gerhard MunchLockwood, Shawn Carattini, MD 04/02/17 [id number]

## 2017-04-02 NOTE — ED Notes (Signed)
Dr. Jeraldine LootsLockwood aware of patients desire to leave, will be in tos [place] to patient shortly

## 2017-04-02 NOTE — ED Notes (Signed)
Pt to xray

## 2017-04-02 NOTE — ED Notes (Signed)
Per Dr. Jeraldine LootsLockwood, pt is okay to be discharged home. Sister will pick up patient at 645pm.

## 2017-04-05 NOTE — Addendum Note (Signed)
Addendum  created 04/05/17 1532 by Alby Schwabe, MD   Sign clinical note    

## 2017-04-06 ENCOUNTER — Telehealth: Payer: Self-pay

## 2017-04-06 ENCOUNTER — Telehealth: Payer: Self-pay | Admitting: *Deleted

## 2017-04-06 NOTE — Telephone Encounter (Signed)
-----   Message from Margaretmary LombardNubia K [place], [place] sent at 04/06/2017  3:09 PM EDT ----- Please inform patient or sister of his labs showing improved blood count. Continue to stay hydrated with water. Follow up as planned.

## 2017-04-06 NOTE — Progress Notes (Signed)
Please inform patient or sister of his labs showing improved blood count. Continue to stay hydrated with water. Follow up as planned.

## 2017-04-06 NOTE — Telephone Encounter (Signed)
[name] after he was adm to obs at hospital since d/c from inpt rehab.  Approval given.

## 2017-04-06 NOTE — Telephone Encounter (Signed)
CMA call regarding lab results   Patient sister  Verify DOB    Patient sister was aware and understood

## 2017-04-07 ENCOUNTER — Ambulatory Visit: Payer: Self-pay

## 2017-04-08 ENCOUNTER — Ambulatory Visit: Payer: Self-pay | Attending: Internal Medicine

## 2017-04-11 ENCOUNTER — Emergency Department (HOSPITAL_COMMUNITY)
Admission: EM | Admit: 2017-04-11 | Discharge: 2017-04-11 | Disposition: A | Discharge status: Home / Self Care | Payer: Self-pay | Attending: Emergency Medicine | Admitting: Emergency Medicine

## 2017-04-11 ENCOUNTER — Emergency Department (HOSPITAL_COMMUNITY): Payer: Self-pay

## 2017-04-11 ENCOUNTER — Encounter (HOSPITAL_COMMUNITY): Payer: Self-pay | Admitting: Emergency Medicine

## 2017-04-11 DIAGNOSIS — Y658 Other specified misadventures during surgical and medical care: Secondary | ICD-10-CM | POA: Insufficient documentation

## 2017-04-11 DIAGNOSIS — T814XXA Infection following a procedure, initial encounter: Secondary | ICD-10-CM | POA: Insufficient documentation

## 2017-04-11 DIAGNOSIS — T8140XA Infection following a procedure, unspecified, initial encounter: Secondary | ICD-10-CM

## 2017-04-11 LAB — BASIC METABOLIC PANEL
Anion gap: 8 (ref 5–15)
BUN: 12 mg/dL (ref 6–20)
CHLORIDE: 104 mmol/L (ref 101–111)
CO2: 24 mmol/L (ref 22–32)
CREATININE: 0.64 mg/dL (ref 0.61–1.24)
Calcium: 8.9 mg/dL (ref 8.9–10.3)
GFR calc Af Amer: >60 mL/min (ref >60)
GFR calc non Af Amer: >60 mL/min (ref >60)
GLUCOSE: 181 mg/dL — AB (ref 65–99)
POTASSIUM: 4.1 mmol/L (ref 3.5–5.1)
SODIUM: 136 mmol/L (ref 135–145)

## 2017-04-11 LAB — CBC WITH DIFFERENTIAL/PLATELET
Basophils Absolute: 0 10*3/uL (ref 0.0–0.1)
Basophils Relative: 0 %
EOS PCT: 1 %
Eosinophils Absolute: 0.1 10*3/uL (ref 0.0–0.7)
HCT: 32 % — ABNORMAL LOW (ref 39.0–52.0)
Hemoglobin: 10.9 g/dL — ABNORMAL LOW (ref 13.0–17.0)
LYMPHS ABS: 1.1 10*3/uL (ref 0.7–4.0)
LYMPHS PCT: 20 %
MCH: 27.9 pg (ref 26.0–34.0)
MCHC: 34.1 g/dL (ref 30.0–36.0)
MCV: 82.1 fL (ref 78.0–100.0)
MONO ABS: 0.3 10*3/uL (ref 0.1–1.0)
Monocytes Relative: 5 %
Neutro Abs: 4 10*3/uL (ref 1.7–7.7)
Neutrophils Relative %: 74 %
PLATELETS: 261 10*3/uL (ref 150–400)
RBC: 3.9 MIL/uL — ABNORMAL LOW (ref 4.22–5.81)
RDW: 13.3 % (ref 11.5–15.5)
WBC: 5.4 10*3/uL (ref 4.0–10.5)

## 2017-04-11 LAB — I-STAT CG4 LACTIC ACID, ED: LACTIC ACID, VENOUS: 2.68 mmol/L — AB (ref 0.5–1.9)

## 2017-04-11 MED ORDER — DOXYCYCLINE HYCLATE 100 MG PO TABS
100.0000 mg | ORAL_TABLET | Freq: Once | ORAL | Status: AC
  Administered 2017-04-11: 100 mg via ORAL
  Filled 2017-04-11: qty 1

## 2017-04-11 MED ORDER — DOXYCYCLINE HYCLATE 100 MG PO CAPS: 100.0000 mg | ORAL_CAPSULE | Freq: Two times a day (BID) | ORAL | 0 refills | Status: DC

## 2017-04-11 MED ORDER — SODIUM CHLORIDE 0.9 % IV BOLUS (SEPSIS)
1000.0000 mL | Freq: Once | INTRAVENOUS | Status: AC
  Administered 2017-04-11: 1000 mL via INTRAVENOUS

## 2017-04-11 NOTE — ED Provider Notes (Signed)
[facility] Provider Note   CSN: [id number] Arrival date & time: 04/11/17  1124     History   Chief Complaint Chief Complaint  Patient presents with  . Facial Swelling    HPI [name] is a 51 y.o. male.  HPI   Pt with hx TBI, seizures, HTN, DM, traumatic intracranial bleed on 01/10/17 with craniotomy and postoperative complications of pneumonia and UTI, cranioplasty with original bone flap retrieved from abdomen 03/19/17 p/w concern for infection of the incision line, increased headache.  Pt began complaining of more intense headache yesterday, was given tylenol and tramadol without relief.  Has been in bed and covered up with blankets yesterday, less interactive than usual.  Family noticed that the incision is red and swollen and seems to have pus coming out of it.  Have an appointment with Dr Yetta Barre, neurosurgery, tomorrow but contacted the office and were told to come be evaluated in the ED.   Pt does not add additional information. Level V caveat for TBI   Past Medical History:  Diagnosis Date  . Diabetes mellitus without complication [facility])     Patient Active Problem List   Diagnosis Date Noted  . Post-operative pain   . Agitation   . Attention and concentration deficit   . Seizure prophylaxis   . Dysphagia   . Urinary retention   . Type 2 diabetes mellitus with peripheral neuropathy (HCC)   . S/P craniotomy 03/19/2017  . Essential hypertension   . Agitated   . Lethargy   . Benign essential HTN   . Poorly controlled type 2 diabetes mellitus with peripheral neuropathy (HCC)   . 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 Aphasia 02/01/2017  . Dysphagia, oropharyngeal 02/01/2017  . Intracerebral hemorrhage (HCC) 01/14/2017  . Fall 01/10/2017  . Subdural hemorrhage (HCC) 01/10/2017  . Diabetes type 2, uncontrolled (HCC) 06/13/2015    Past Surgical History:  Procedure Laterality Date  .  CRANIOTOMY Right 01/13/2017   Procedure: 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, Placement of bone flap in abdomen;  Surgeon: Coletta Memos, MD;  Location: [facility] OR;  Service: Neurosurgery;  Laterality: Right;  . CRANIOTOMY Right 03/19/2017   Procedure: Cranioplasty with placement of bone flap from abdomen;  Surgeon: Tia Alert, MD;  Location: [facility] OR;  Service: Neurosurgery;  Laterality: Right;       Home Medications    Prior to Admission medications   Medication Sig Start Date End Date Taking? Authorizing Provider  acetaminophen (TYLENOL) 500 MG tablet Take 500 mg by mouth every 6 (six) hours as needed for moderate pain.   Yes [provider]  levETIRAcetam (KEPPRA) 500 MG tablet Take 1 tablet (500 mg total) by mouth 2 (two) times daily. 04/01/17  Yes Georgian Co M, PA-C  metFORMIN (GLUCOPHAGE) 1000 MG tablet Take 1 tablet (1,000 mg total) by mouth 2 (two) times daily with a meal. 04/01/17  Yes McClung, Angela M, PA-C  traMADol (ULTRAM) 50 MG tablet Take 1 tablet (50 mg total) by mouth every 6 (six) hours as needed for severe pain. 04/01/17  Yes Georgian Co M, PA-C  doxycycline (VIBRAMYCIN) 100 MG capsule Take 1 capsule (100 mg total) by mouth 2 (two) times daily. One po bid x 7 days 04/11/17   Trixie Dredge, PA-C  Multiple Vitamin (MULTIVITAMIN) LIQD Place 15 mLs into feeding tube daily. Patient not taking: Reported on 04/01/2017 02/02/17   Focht,  Jessica L, PA  QUEtiapine (SEROQUEL) 25 MG tablet Take 0.5 tablets (12.5 mg total) by mouth at bedtime as needed (insomnia agitation). Patient not taking: Reported on 04/02/2017 04/01/17   Anders Simmonds, PA-C    Family History Family History  Problem Relation Age of Onset  . Diabetes Mother   . Diabetes Father     Social History Social History  Substance Use Topics  . Smoking status: Never Smoker  . Smokeless tobacco: Never Used  . Alcohol use Yes     Comment: socially, not since accident      Allergies   No known allergies and Latex   Review of Systems Review of Systems  Unable to perform ROS: Other     Physical Exam Updated Vital Signs BP 111/78   Pulse 81   Temp 98.3 F (36.8 C) (Rectal)   Resp 18   SpO2 100%   Physical Exam  Constitutional: He appears well-developed and well-nourished. No distress.  HENT:  Head: Normocephalic.  Hemicraniotomy incision with staples intact.  Incision closer to forehead with swelling underneath, entire incision with localized erythema, right preauricular swelling, erythema, tenderness.  Please see photos below.  Crusty yellow/white discharge around staples.     Neck: Neck supple.  Cardiovascular: Normal rate and regular rhythm.   Pulmonary/Chest: Effort normal and breath sounds normal. No respiratory distress. He has no wheezes. He has no rales.  Abdominal: Soft. He exhibits no distension and no mass. There is no tenderness. There is no rebound and no guarding.  Neurological: He is alert. He exhibits normal muscle tone.  Skin: He is not diaphoretic.  Nursing note and vitals reviewed.        ED Treatments / Results  Labs (all labs ordered are listed, but only abnormal results are displayed) Labs Reviewed  BASIC METABOLIC PANEL - Abnormal; Notable for the following:       Result Value   Glucose, Bld 181 (*)    All other components within normal limits  CBC WITH DIFFERENTIAL/PLATELET - Abnormal; Notable for the following:    RBC 3.90 (*)    Hemoglobin 10.9 (*)    HCT 32.0 (*)    All other components within normal limits  I-STAT CG4 LACTIC ACID, ED - Abnormal; Notable for the following:    Lactic Acid, Venous 2.68 (*)    All other components within normal limits    EKG  EKG Interpretation None       Radiology Ct Head Wo Contrast  Result Date: 04/11/2017 CLINICAL DATA:  51 year old male with purulent drainage from craniotomy incision. March 2018 craniectomy for posttraumatic intracranial hemorrhage,  with replacement of bone flap in May. EXAM: CT HEAD WITHOUT CONTRAST TECHNIQUE: Contiguous axial images were obtained from the base of the skull through the vertex without intravenous contrast. COMPARISON:  Head CTs 04/02/2017 and earlier. FINDINGS: Brain: Mild dural thickening underlying the craniotomy site appears to be postoperative in nature. Right inferior frontal gyrus and anterior temporal lobe encephalomalacia appears stable with small volume of stable extra-axial fluid that most resembles CSF. No midline shift, mass effect, or evidence of intracranial mass lesion. Stable ventricle size and configuration. Stable gray-white matter differentiation throughout the brain. No cortically based acute infarct identified. No acute intracranial hemorrhage identified. Vascular: Mild Calcified atherosclerosis at the skull base. Skull: Craniotomy bone flap appears stable in configuration since May. Metal fasteners appears stable with no adverse features. No superimposed acute osseous abnormality identified. Sinuses/Orbits: Visualized paranasal sinuses and mastoids are stable and  well pneumatized. Other: Scalp skin staples remain in place. No subcutaneous gas identified, but broad-based residual scalp hematoma or intermediate density fluid collection persists about the craniotomy site, and skin thickening superior to the right pinna has increased since May. No organized or drainable scalp collection is evident on this study. IMPRESSION: 1. Skin staples remain in place Persistent scalp soft tissue thickening and broad-based residual scalp hematoma or intermediate density fluid about the craniotomy site since May, but no subcutaneous gas and no organized or drainable fluid collection is evident on this noncontrast study. 2. Stable postoperative appearance of the craniotomy flap. No evidence of skull osteomyelitis. 3. Stable non contrast CT appearance of the brain. Posttraumatic encephalomalacia of the right inferior frontal  gyrus and anterior temporal lobe. Electronically Signed   By: Odessa FlemingH  Hall M.D.   On: 04/11/2017 14:28    Procedures Procedures (including critical care time)  Medications Ordered in ED Medications  doxycycline (VIBRA-TABS) tablet 100 mg (not administered)  sodium chloride 0.9 % bolus 1,000 mL (0 mLs Intravenous Stopped 04/11/17 1423)     Initial Impression / Assessment and Plan / ED Course  I have reviewed the triage vital signs and the nursing notes.  Pertinent labs & imaging results that were available during my care of the patient were reviewed by me and considered in my medical decision making (see chart for details).  Clinical Course as of Apr 11 1518  Sun Apr 11, 2017  1439 Signed chart so that Dr Danielle DessElsner could visualize chart.  Signed prior to finishing documentation for that purpose.    [EW]  1440 I spoke with Dr Danielle DessElsner who has reviewed patient's CT and has seen the photos of the patient's incision.  He recommends starting doxycycline, giving patient betadine swabs for home, leave sutures in place, f/u with Dr Yetta BarreJones tomorrow as planned.    [EW]    Clinical Course User Index [EW] Trixie DredgeWest, Olson Lucarelli, [place]-C   Afebrile, nontoxic patient with apparently infection of surgical incision from craniectomy and cranioplasty 5/18.  Has slight elevation in lactic acid but has had headache, spending time in bed, more likely dehydration, doubt sepsis.  WBC is normal, no fever.  Pt does note the pain is in his skin, not inside his head.  Discussed pt with Dr Danielle DessElsner, as above.  Will leave staples in place, prescribe doxycycline.  First dose given in ED.  Family provided with betadine swabs to paint on incision to keep clean.  Pt has appointment with Dr Yetta BarreJones tomorrow at 3:30 - will follow up then.  IVF given.   D/C home with doxycycline, betadine, neurosurgery follow up tomorrow.  Discussed result, findings, treatment, and follow up  with patient.  Pt given return precautions.  Pt verbalizes understanding and  agrees with plan.        Final Clinical Impressions(s) / ED Diagnoses   Final diagnoses:  Postoperative infection, initial encounter    New Prescriptions New Prescriptions   DOXYCYCLINE (VIBRAMYCIN) 100 MG CAPSULE    Take 1 capsule (100 mg total) by mouth 2 (two) times daily. One po bid x 7 days     Trixie DredgeWest, Jafet Wissing, Cordelia Poche-C 04/11/17 1436    Trixie DredgeWest, Paola Aleshire, [place]-C 04/11/17 1520    Vanetta MuldersZackowski, Scott, MD 04/12/17 0830

## 2017-04-11 NOTE — ED Triage Notes (Signed)
Pt here for right sided facial swelling post craniotomy; pt with some purulent drainage from area per family

## 2017-04-11 NOTE — ED Notes (Signed)
Date and time results received: 04/11/17 1303 (use smartphrase ".now" to insert current time)  Test: lactic acid Critical Value: 2.68  Name of Provider Notified: Trixie DredgeEmily West, PA  Orders Received? Or Actions Taken?:

## 2017-04-11 NOTE — Discharge Instructions (Signed)
Read the information below.  Use the prescribed medication as directed.  Please discuss all new medications with your pharmacist.  You may return to the Emergency Department at any time for worsening condition or any new symptoms that concern you.   If you develop increased redness, swelling, uncontrolled pain, or fevers greater than 100.4, return to the ER immediately for a recheck.   °

## 2017-04-12 DIAGNOSIS — Z7984 Long term (current) use of oral hypoglycemic drugs: Secondary | ICD-10-CM

## 2017-04-12 DIAGNOSIS — R41840 Attention and concentration deficit: Secondary | ICD-10-CM

## 2017-04-12 DIAGNOSIS — S065X3S Traumatic subdural hemorrhage with loss of consciousness of 1 hour to 5 hours 59 minutes, sequela: Secondary | ICD-10-CM

## 2017-04-12 DIAGNOSIS — S0219XD Other fracture of base of skull, subsequent encounter for fracture with routine healing: Secondary | ICD-10-CM

## 2017-04-12 DIAGNOSIS — E1165 Type 2 diabetes mellitus with hyperglycemia: Secondary | ICD-10-CM

## 2017-04-12 DIAGNOSIS — S066X3S Traumatic subarachnoid hemorrhage with loss of consciousness of 1 hour to 5 hours 59 minutes, sequela: Secondary | ICD-10-CM

## 2017-04-12 DIAGNOSIS — Z9181 History of falling: Secondary | ICD-10-CM

## 2017-04-12 DIAGNOSIS — R1310 Dysphagia, unspecified: Secondary | ICD-10-CM

## 2017-04-12 DIAGNOSIS — Z48811 Encounter for surgical aftercare following surgery on the nervous system: Secondary | ICD-10-CM

## 2017-04-12 DIAGNOSIS — S06343S Traumatic hemorrhage of right cerebrum with loss of consciousness of 1 hours to 5 hours 59 minutes, sequela: Secondary | ICD-10-CM

## 2017-04-12 DIAGNOSIS — R2689 Other abnormalities of gait and mobility: Secondary | ICD-10-CM

## 2017-04-12 DIAGNOSIS — I10 Essential (primary) hypertension: Secondary | ICD-10-CM

## 2017-04-12 MED FILL — DOXYCYCLINE 100 MG TABLET: 100 | 7 days supply | Qty: 14 | Fill #0

## 2017-04-19 ENCOUNTER — Encounter: Payer: Self-pay | Admitting: Internal Medicine

## 2017-04-19 ENCOUNTER — Ambulatory Visit: Payer: Self-pay | Attending: Internal Medicine | Admitting: Internal Medicine

## 2017-04-19 VITALS — BP 102/68 | HR 88 | Temp 98.4°F | Resp 16 | Wt 134.0 lb

## 2017-04-19 DIAGNOSIS — Z7984 Long term (current) use of oral hypoglycemic drugs: Secondary | ICD-10-CM | POA: Insufficient documentation

## 2017-04-19 DIAGNOSIS — K069 Disorder of gingiva and edentulous alveolar ridge, unspecified: Secondary | ICD-10-CM | POA: Insufficient documentation

## 2017-04-19 DIAGNOSIS — IMO0001 Reserved for inherently not codable concepts without codable children: Secondary | ICD-10-CM

## 2017-04-19 DIAGNOSIS — T814XXS Infection following a procedure, sequela: Secondary | ICD-10-CM | POA: Insufficient documentation

## 2017-04-19 DIAGNOSIS — X58XXXS Exposure to other specified factors, sequela: Secondary | ICD-10-CM | POA: Insufficient documentation

## 2017-04-19 DIAGNOSIS — R569 Unspecified convulsions: Secondary | ICD-10-CM | POA: Insufficient documentation

## 2017-04-19 DIAGNOSIS — Z91040 Latex allergy status: Secondary | ICD-10-CM | POA: Insufficient documentation

## 2017-04-19 DIAGNOSIS — E1165 Type 2 diabetes mellitus with hyperglycemia: Secondary | ICD-10-CM | POA: Insufficient documentation

## 2017-04-19 DIAGNOSIS — R1312 Dysphagia, oropharyngeal phase: Secondary | ICD-10-CM | POA: Insufficient documentation

## 2017-04-19 DIAGNOSIS — Z87440 Personal history of urinary (tract) infections: Secondary | ICD-10-CM | POA: Insufficient documentation

## 2017-04-19 DIAGNOSIS — S06369D Traumatic hemorrhage of cerebrum, unspecified, with loss of consciousness of unspecified duration, subsequent encounter: Secondary | ICD-10-CM | POA: Insufficient documentation

## 2017-04-19 DIAGNOSIS — E119 Type 2 diabetes mellitus without complications: Secondary | ICD-10-CM

## 2017-04-19 DIAGNOSIS — R4701 Aphasia: Secondary | ICD-10-CM | POA: Insufficient documentation

## 2017-04-19 DIAGNOSIS — Z9889 Other specified postprocedural states: Secondary | ICD-10-CM | POA: Insufficient documentation

## 2017-04-19 DIAGNOSIS — I10 Essential (primary) hypertension: Secondary | ICD-10-CM | POA: Insufficient documentation

## 2017-04-19 DIAGNOSIS — E1142 Type 2 diabetes mellitus with diabetic polyneuropathy: Secondary | ICD-10-CM | POA: Insufficient documentation

## 2017-04-19 DIAGNOSIS — Z833 Family history of diabetes mellitus: Secondary | ICD-10-CM | POA: Insufficient documentation

## 2017-04-19 DIAGNOSIS — R451 Restlessness and agitation: Secondary | ICD-10-CM | POA: Insufficient documentation

## 2017-04-19 DIAGNOSIS — R45851 Suicidal ideations: Secondary | ICD-10-CM | POA: Insufficient documentation

## 2017-04-19 LAB — GLUCOSE, POCT (MANUAL RESULT ENTRY): POC GLUCOSE: 167 mg/dL — AB (ref 70–99)

## 2017-04-19 MED ORDER — GLUCOSE BLOOD VI STRP: ORAL_STRIP | 12 refills | Status: DC

## 2017-04-19 MED ORDER — LEVETIRACETAM 500 MG PO TABS
500.0000 mg | ORAL_TABLET | Freq: Two times a day (BID) | ORAL | 1 refills | Status: DC
Start: 2017-04-19 — End: 2017-04-19

## 2017-04-19 MED ORDER — LEVETIRACETAM 500 MG PO TABS: 500.0000 mg | ORAL_TABLET | Freq: Two times a day (BID) | ORAL | 1 refills | Status: DC

## 2017-04-19 MED ORDER — TRUE METRIX METER W/DEVICE KIT
PACK | 0 refills | Status: DC
Start: 2017-04-19 — End: 2022-09-29

## 2017-04-19 MED ORDER — TRUEPLUS LANCETS 28G MISC: 6 refills | Status: DC

## 2017-04-19 MED FILL — levETIRAcetam 500 MG TABS: 500 | 30 days supply | Qty: 60 | Fill #0

## 2017-04-19 MED FILL — TRUEplus LANCETS 28G MISC: 30 days supply | Qty: 100 | Fill #0

## 2017-04-19 MED FILL — !TRUE METRIX BLOOD GLUCOSE: 30 days supply | Qty: 1 | Fill #0

## 2017-04-19 MED FILL — TRUE METRIX TEST STRIP: 30 days supply | Qty: 100 | Fill #0

## 2017-04-19 NOTE — Patient Instructions (Signed)
Continue Metformin.  Please call Dr. Yetta BarreJones and follow up with him for drainage of pocket of infection over incision site.

## 2017-04-19 NOTE — Progress Notes (Signed)
Patient ID: [name], male    DOB: 1966-07-07  MRN: [id number]  CC: Establish Care; Diabetes; and Hospitalization Follow-up   Subjective: [name] is a 51 y.o. male who presents for f/u visit.  Sister, [name], is with him and interprets His concerns today include:  Patient is a 51 year old male with history of diabetes type 2 and hypertension who was seen 04/01/2017 by our PA post prolonged hospitalization requiring ICU stay and  inpatient rehabilitation for intracranial bleed, cranial fractures and subdural hematomas. Hospital course was complicated by pneumonia and Escherichia coli UTI. Patient underwent frontotemporparitial craniotomy followed by cranioplasty on 03/2017. He is followed by a neurosurgeon Dr. Ronnald Ramp.  Recent ER visit on 60 for suicidal ideation and aggressive behavior at home. Repeat CAT scan of the head revealed no major changes. Seen again 6/10 with concern for infection at the incision site and headaches. He was afebrile. White blood cell count was normal. There was mild erythema at the incision site. Patient given doxycycline and Betadine swabs and told to follow-up with his  Neurosurgeon.  1. Today pt states he was not suicidal and denies feeling depress -Sister reports that his mood has improved and agitation has decreased.  Takes Seroquil PRN Pt trying to be more independent. Spending less time in wheelchair. Gait more stable -wanting to go back to work in [place]  2.  Saw neurologist 1 wk ago.  Staples removed and Doxycycline extended to 1 more wk -sister noted a small soft pocket at the front edge of incision.  No drainage -had temp of 99. today when seen by P.T -did not take anything for the low grade temp.  P.T called Dr. Stevenson Clinch office and left a message -pt denies HA or increase HA at this time.  No cough/sob/doe  3.  BS 141-169 -Glucotrol d/c on last visit due to hypoglycemia -eating less than before but "eating well." -needs  a meter.  Using his sister's Patient Active Problem List   Diagnosis Date Noted  . Post-operative pain   . Agitation   . Attention and concentration deficit   . Seizure prophylaxis   . Dysphagia   . Urinary retention   . Type 2 diabetes mellitus with peripheral neuropathy (HCC)   . S/P craniotomy 03/19/2017  . Essential hypertension   . Agitated   . Lethargy   . Benign essential HTN   . Poorly controlled type 2 diabetes mellitus with peripheral neuropathy ([place])   . 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 Aphasia 02/01/2017  . Dysphagia, oropharyngeal 02/01/2017  . Intracerebral hemorrhage ([place]) 01/14/2017  . Fall 01/10/2017  . Subdural hemorrhage ([place]) 01/10/2017  . Diabetes type 2, uncontrolled ([place]) 06/13/2015     Current Outpatient Prescriptions on File Prior to Visit  Medication Sig Dispense Refill  . acetaminophen (TYLENOL) 500 MG tablet Take 500 mg by mouth every 6 (six) hours as needed for moderate pain.    Marland Kitchen doxycycline (VIBRAMYCIN) 100 MG capsule Take 1 capsule (100 mg total) by mouth 2 (two) times daily. One po bid x 7 days 14 capsule 0  . metFORMIN (GLUCOPHAGE) 1000 MG tablet Take 1 tablet (1,000 mg total) by mouth 2 (two) times daily with a meal. 180 tablet 3  . Multiple Vitamin (MULTIVITAMIN) LIQD Place 15 mLs into feeding tube daily. (Patient not taking: Reported on 04/01/2017)    . QUEtiapine (SEROQUEL) 25 MG tablet Take 0.5 tablets (12.5  mg total) by mouth at bedtime as needed (insomnia agitation). (Patient not taking: Reported on 04/02/2017) 30 tablet 1  . traMADol (ULTRAM) 50 MG tablet Take 1 tablet (50 mg total) by mouth every 6 (six) hours as needed for severe pain. 40 tablet 0   No current facility-administered medications on file prior to visit.     Allergies  Allergen Reactions  . No Known Allergies   . Latex Itching and Rash    Social History   Social History  . Marital status: Single     Spouse name: N/A  . Number of children: N/A  . Years of education: N/A   Occupational History  . Not on file.   Social History Main Topics  . Smoking status: Never Smoker  . Smokeless tobacco: Never Used  . Alcohol use Yes     Comment: socially, not since accident  . Drug use: No  . Sexual activity: Not on file   Other Topics Concern  . Not on file   Social History Narrative  . No narrative on file    Family History  Problem Relation Age of Onset  . Diabetes Mother   . Diabetes Father     Past Surgical History:  Procedure Laterality Date  . CRANIOTOMY Right 01/13/2017   Procedure: 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, Placement of bone flap in abdomen;  Surgeon: Ashok Pall, MD;  Location: [place];  Service: Neurosurgery;  Laterality: Right;  . CRANIOTOMY Right 03/19/2017   Procedure: Cranioplasty with placement of bone flap from abdomen;  Surgeon: Eustace Moore, MD;  Location: [place];  Service: Neurosurgery;  Laterality: Right;    ROS: Review of Systems As stated above  PHYSICAL EXAM: BP 102/68   Pulse 88   Temp 98.4 F (36.9 C) (Oral)   Resp 16   Wt 134 lb (60.8 kg)   SpO2 99%   BMI 24.51 kg/m   Wt Readings from Last 3 Encounters:  04/19/17 134 lb (60.8 kg)  04/01/17 132 lb 6.4 oz (60.1 kg)  03/27/17 134 lb (60.8 kg)   Physical Exam   General appearance - pt alert and answers questions appropriately Mental status -flat affect Mouth - significant plaque build up around teeth, no oral lesions, throat clear Neck - supple, no significant adenopathy Chest - clear to auscultation, no wheezes, rales or rhonchi, symmetric air entry Heart - normal rate, regular rhythm, normal S1, S2, no murmurs, rubs, clicks or gallops Neurological -broad base gait.  Holds on to walls intermittently Skin - Well-healed incision on scalp except for mild fluctuance on the leading edge of the incision frontal area  Lab Results  Component Value Date    HGBA1C 5.4 04/01/2017   Lab Results  Component Value Date   WBC 5.4 04/11/2017   HGB 10.9 (L) 04/11/2017   HCT 32.0 (L) 04/11/2017   MCV 82.1 04/11/2017   PLT 261 04/11/2017     Chemistry      Component Value Date/Time   NA 136 04/11/2017 1246   NA 139 04/01/2017 1444   K 4.1 04/11/2017 1246   CL 104 04/11/2017 1246   CO2 24 04/11/2017 1246   BUN 12 04/11/2017 1246   BUN 13 04/01/2017 1444   CREATININE 0.64 04/11/2017 1246   CREATININE 0.71 06/02/2016 1232      Component Value Date/Time   CALCIUM 8.9 04/11/2017 1246   ALKPHOS 44 04/02/2017 1316   AST 15 04/02/2017 1316   ALT 15 (L)  04/02/2017 1316   BILITOT 0.5 04/02/2017 1316   BILITOT 0.2 04/01/2017 1444     Depression screen PHQ 2/9 04/19/2017  Decreased Interest 0  Down, Depressed, Hopeless 0  PHQ - 2 Score 0  Altered sleeping 3  Tired, decreased energy 0  Change in appetite 0  Feeling bad or failure about yourself  0  Trouble concentrating 0  Moving slowly or fidgety/restless 0  Suicidal thoughts 0  PHQ-9 Score 3     ASSESSMENT AND PLAN: 1. Controlled type 2 diabetes mellitus without complication, without long-term current use of insulin ([place]) -Prescription sent to pharmacy for glucometer. He will continue metformin. - POCT glucose (manual entry) - Blood Glucose Monitoring Suppl (TRUE METRIX METER) w/Device KIT; Use as directed  Dispense: 1 kit; Refill: 0 - glucose blood (TRUE METRIX BLOOD GLUCOSE TEST) test strip; Use as instructed  Dispense: 100 each; Refill: 12 - TRUEPLUS LANCETS 28G MISC; Use as directed  Dispense: 100 each; Refill: 6  2. Incisional infection, sequela -Advised sister to call Dr. Ronnald Ramp office to report this change in the incision. I think he may have small pocket of pus in that area. 3. Hx of craniotomy 4. Agitation Clinically improved  5. Gum disease - Ambulatory referral to Dentistry  6. Traumatic hemorrhage of cerebrum with loss of consciousness, unspecified laterality,  subsequent encounter - levETIRAcetam (KEPPRA) 500 MG tablet; Take 1 tablet (500 mg total) by mouth 2 (two) times daily.  Dispense: 60 tablet; Refill: 1  Patient was given the opportunity to ask questions.  Patient verbalized understanding of the plan and was able to repeat key elements of the plan.   Orders Placed This Encounter  Procedures  . Ambulatory referral to Dentistry  . POCT glucose (manual entry)     Requested Prescriptions   Signed Prescriptions Disp Refills  . Blood Glucose Monitoring Suppl (TRUE METRIX METER) w/Device KIT 1 kit 0    Sig: Use as directed  . glucose blood (TRUE METRIX BLOOD GLUCOSE TEST) test strip 100 each 12    Sig: Use as instructed  . TRUEPLUS LANCETS 28G MISC 100 each 6    Sig: Use as directed  . levETIRAcetam (KEPPRA) 500 MG tablet 60 tablet 1    Sig: Take 1 tablet (500 mg total) by mouth 2 (two) times daily.    Return in about 2 months (around 06/19/2017).  Karle Plumber, MD, FACP

## 2017-04-20 ENCOUNTER — Telehealth: Payer: Self-pay | Admitting: *Deleted

## 2017-04-20 NOTE — Telephone Encounter (Signed)
[facility]onda Standardllison [facility] 1wk1 . Approval given.

## 2017-04-26 ENCOUNTER — Telehealth: Payer: Self-pay | Admitting: *Deleted

## 2017-04-26 NOTE — Telephone Encounter (Signed)
[facility] to ask for verbal orders to continue PT 2wk3.  Approval given.

## 2017-05-03 ENCOUNTER — Telehealth: Payer: Self-pay

## 2017-05-03 NOTE — Telephone Encounter (Signed)
[facility] during [facility] visit today   patient was having  a hard time remembering and processing information. Amber also states [name] (Patient girlfriend) mentioned to her that Patient has forgotten how to tie his shoe on different occasions. If there are any questions or concerns Amber can be reached at [phone]. Patient does have a follow up appointment schedule for 05/04/2017.

## 2017-05-04 ENCOUNTER — Encounter: Payer: Self-pay | Admitting: Physical Medicine & Rehabilitation

## 2017-05-04 ENCOUNTER — Encounter: Payer: Self-pay | Admitting: Pediatric Intensive Care

## 2017-05-04 ENCOUNTER — Encounter: Payer: Self-pay | Attending: Physical Medicine & Rehabilitation | Admitting: Physical Medicine & Rehabilitation

## 2017-05-04 VITALS — BP 100/67 | HR 93 | Resp 14

## 2017-05-04 DIAGNOSIS — E1142 Type 2 diabetes mellitus with diabetic polyneuropathy: Secondary | ICD-10-CM | POA: Insufficient documentation

## 2017-05-04 DIAGNOSIS — Z7984 Long term (current) use of oral hypoglycemic drugs: Secondary | ICD-10-CM | POA: Insufficient documentation

## 2017-05-04 DIAGNOSIS — S062X3S Diffuse traumatic brain injury with loss of consciousness of 1 hour to 5 hours 59 minutes, sequela: Secondary | ICD-10-CM

## 2017-05-04 DIAGNOSIS — S065X9D Traumatic subdural hemorrhage with loss of consciousness of unspecified duration, subsequent encounter: Secondary | ICD-10-CM | POA: Insufficient documentation

## 2017-05-04 DIAGNOSIS — Z833 Family history of diabetes mellitus: Secondary | ICD-10-CM | POA: Insufficient documentation

## 2017-05-04 DIAGNOSIS — W19XXXD Unspecified fall, subsequent encounter: Secondary | ICD-10-CM | POA: Insufficient documentation

## 2017-05-04 DIAGNOSIS — S0219XD Other fracture of base of skull, subsequent encounter for fracture with routine healing: Secondary | ICD-10-CM | POA: Insufficient documentation

## 2017-05-04 DIAGNOSIS — I10 Essential (primary) hypertension: Secondary | ICD-10-CM | POA: Insufficient documentation

## 2017-05-04 DIAGNOSIS — R410 Disorientation, unspecified: Secondary | ICD-10-CM | POA: Insufficient documentation

## 2017-05-04 DIAGNOSIS — E1165 Type 2 diabetes mellitus with hyperglycemia: Secondary | ICD-10-CM

## 2017-05-04 DIAGNOSIS — Z79899 Other long term (current) drug therapy: Secondary | ICD-10-CM | POA: Insufficient documentation

## 2017-05-04 NOTE — Progress Notes (Signed)
Subjective:    Patient ID: [name], male    DOB: 1966-04-24, 51 y.o.   MRN: [id number]  HPI [name] is here in follow up of his severe TBI. He has been home with family. He was in the ED with a ?infection of scalp wound. Dr. Yetta Barre has been following him as well. He is weaning him off the keppra. Sister reports that he had not been as alert as usual, hence the change in the keppra dosing.   He is sleeping and eating well. Hasn't fallen. Has episodes where he's depressed and wants to return to [facility] responds well to to family interaction, exercise however. I asked him if he was depressed and he said "no. ".   He is not having pain. He is emptying bowel or bladder.   Pain Inventory Average Pain 0 Pain Right Now 0 My pain is no pain  In the last 24 hours, has pain interfered with the following? General activity 0 Relation with others 0 Enjoyment of life 0 What TIME of day is your pain at its worst? no pain Sleep (in general) Good  Pain is worse with: no pain Pain improves with: no pain Relief from Meds: no pain  Mobility walk without assistance do you drive?  no use a wheelchair needs help with transfers  Function disabled: date disabled .  Neuro/Psych trouble walking confusion  Prior Studies hospital f/u  Physicians involved in your care hospital f/u   Family History  Problem Relation Age of Onset  . Diabetes Mother   . Diabetes Father    Social History   Social History  . Marital status: Single    Spouse name: N/A  . Number of children: N/A  . Years of education: N/A   Social History Main Topics  . Smoking status: Never Smoker  . Smokeless tobacco: Never Used  . Alcohol use Yes     Comment: socially, not since accident  . Drug use: No  . Sexual activity: Not Asked   Other Topics Concern  . None   Social History Narrative  . None   Past Surgical History:  Procedure Laterality Date  . CRANIOTOMY Right 01/13/2017   Procedure: 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, Placement of bone flap in abdomen;  Surgeon: Coletta Memos, MD;  Location: [facility] OR;  Service: Neurosurgery;  Laterality: Right;  . CRANIOTOMY Right 03/19/2017   Procedure: Cranioplasty with placement of bone flap from abdomen;  Surgeon: Tia Alert, MD;  Location: [facility] OR;  Service: Neurosurgery;  Laterality: Right;   Past Medical History:  Diagnosis Date  . Diabetes mellitus without complication (HCC)    BP 100/67 (BP Location: Left Arm, Patient Position: Sitting, Cuff Size: Normal)   Pulse 93   Resp 14   SpO2 97%   Opioid Risk Score:   Fall Risk Score:  `1  Depression screen PHQ 2/9  Depression screen [facility] 2/9 04/19/2017 04/01/2017 07/31/2016 07/27/2016 06/02/2016  Decreased Interest 0 0 0 0 0  Down, Depressed, Hopeless 0 0 0 0 0  PHQ - 2 Score 0 0 0 0 0  Altered sleeping 3 3 - - -  Tired, decreased energy 0 3 - - -  Change in appetite 0 0 - - -  Feeling bad or failure about yourself  0 0 - - -  Trouble concentrating 0 3 - - -  Moving slowly or fidgety/restless 0 0 - - -  Suicidal thoughts 0 0 - - -  PHQ-9 Score 3 9 - - -    Review of Systems  Constitutional: Positive for appetite change.  HENT: Negative.   Eyes: Negative.   Respiratory: Negative.   Cardiovascular: Negative.   Gastrointestinal: Negative.   Endocrine:       High blood sugar  Genitourinary: Negative.   Musculoskeletal: Negative.   Skin: Negative.   Allergic/Immunologic: Negative.   Neurological: Negative.   Hematological: Negative.   Psychiatric/Behavioral: Positive for confusion and decreased concentration.  All other systems reviewed and are negative.      Objective:   Physical Exam  Constitutional: He appears well-developed and well-nourished.  HENT:  Eyes: EOM are normal. Right eye exhibits no discharge. Left eye exhibits no discharge.  Pupils round and reactive to light Neck: Normal range of motion. Neck supple. No  thyromegaly present.  Cardiovascular: RRR.  Respiratory: normal. .  GI: Soft. Bowel sounds are normal. He exhibits no distension. Abdominal incision clean and dry Musculoskeletal: He exhibits no edema or tenderness.  Neurological: He is alert.  Motor: nearly 5/5. WALKS with a forward lean and steppage gait. Did not lose balance. Fair insight and awareness. Able to answer basic questions.  A&Ox1. Follows simple commands. Attention improved Skin: Skin is warm and dry.  Psychiatric: pleasant and cooperative      Assessment & Plan:  1. Decreased functional mobility secondary to right intraparenchymal hemorrhage-SDH-temporal bone fracture after fall. Status post right frontotemporoparietal craniectomy 01/14/2017 with cranioplasty 03/19/2017  -had made nice progress  -remains pre-vocational. A letter was written to employer as such. 2. Seizure prophylaxis. Keppra 500 mg at night per NS---off soon. . 3. Diabetes mellitus peripheral neuropathy. Hemoglobin A1c 10.4. Glucotrol 5 mg twice a day,             - continue Glucophage at 500 mg daily 4. Hypertension. bp controlled off meds  Thirty minutes of face to face patient care time were spent during this visit. All questions were encouraged and answered. Follow up in 2 months.

## 2017-05-04 NOTE — Patient Instructions (Signed)
TRY CHECKING SUGARS AT DIFFERENT TIMES DURING THE DAY   PLEASE FEEL FREE TO CALL OUR OFFICE WITH ANY PROBLEMS OR QUESTIONS [phone])

## 2017-05-06 NOTE — Telephone Encounter (Signed)
I reviewed these issues with the patient and sister at OV on Monday. meds are being tapered which may be affecting him cognitively.  Overall, however,  he is improving and I didn't see anything alarming on exam.

## 2017-05-07 ENCOUNTER — Other Ambulatory Visit: Payer: Self-pay | Admitting: *Deleted

## 2017-05-07 NOTE — Telephone Encounter (Signed)
Amber, PT, [facility] called and left a message asking for verbal orders to extend home health physical therapy 2week2 to continue to address balance, strength, and endurance.  She reports patient is making significant progress.  Returned call and gave verbal orders per office protocol

## 2017-05-19 DIAGNOSIS — R2689 Other abnormalities of gait and mobility: Secondary | ICD-10-CM

## 2017-05-19 DIAGNOSIS — Z9181 History of falling: Secondary | ICD-10-CM

## 2017-05-19 DIAGNOSIS — I10 Essential (primary) hypertension: Secondary | ICD-10-CM

## 2017-05-19 DIAGNOSIS — E1165 Type 2 diabetes mellitus with hyperglycemia: Secondary | ICD-10-CM

## 2017-05-19 DIAGNOSIS — Z7984 Long term (current) use of oral hypoglycemic drugs: Secondary | ICD-10-CM

## 2017-05-19 DIAGNOSIS — Z48811 Encounter for surgical aftercare following surgery on the nervous system: Secondary | ICD-10-CM

## 2017-05-19 DIAGNOSIS — R1310 Dysphagia, unspecified: Secondary | ICD-10-CM

## 2017-05-19 DIAGNOSIS — S0219XD Other fracture of base of skull, subsequent encounter for fracture with routine healing: Secondary | ICD-10-CM

## 2017-05-19 DIAGNOSIS — S066X3S Traumatic subarachnoid hemorrhage with loss of consciousness of 1 hour to 5 hours 59 minutes, sequela: Secondary | ICD-10-CM

## 2017-05-19 DIAGNOSIS — S06343S Traumatic hemorrhage of right cerebrum with loss of consciousness of 1 hours to 5 hours 59 minutes, sequela: Secondary | ICD-10-CM

## 2017-05-19 DIAGNOSIS — S065X3S Traumatic subdural hemorrhage with loss of consciousness of 1 hour to 5 hours 59 minutes, sequela: Secondary | ICD-10-CM

## 2017-05-19 DIAGNOSIS — R41840 Attention and concentration deficit: Secondary | ICD-10-CM

## 2017-05-19 MED FILL — ?METFORMIN HCL 1,000 MG TAB: 1000 | 30 days supply | Qty: 60 | Fill #1

## 2017-05-24 MED FILL — TRUE METRIX TEST STRIP: 30 days supply | Qty: 100 | Fill #1

## 2017-06-09 NOTE — Congregational Nurse Program (Signed)
Congregational Nurse Program Note  Date of Encounter: 05/04/2017  Past Medical History: Past Medical History:  Diagnosis Date  . Diabetes mellitus without complication (HCC)     Encounter Details:   Client in clinic with his sister.Client is S/P TBI in March and is living with his sister. Client is diabetic and his sister is requesting assistance with medications. CN will attempt contact client's provider to see if CN program can assist with meds.

## 2017-06-29 ENCOUNTER — Ambulatory Visit: Payer: Self-pay | Attending: Internal Medicine | Admitting: Internal Medicine

## 2017-06-29 ENCOUNTER — Encounter: Payer: Self-pay | Admitting: Internal Medicine

## 2017-06-29 VITALS — BP 100/64 | HR 95 | Temp 98.6°F | Ht 63.0 in | Wt 139.0 lb

## 2017-06-29 DIAGNOSIS — E1142 Type 2 diabetes mellitus with diabetic polyneuropathy: Secondary | ICD-10-CM | POA: Insufficient documentation

## 2017-06-29 DIAGNOSIS — R1312 Dysphagia, oropharyngeal phase: Secondary | ICD-10-CM | POA: Insufficient documentation

## 2017-06-29 DIAGNOSIS — K069 Disorder of gingiva and edentulous alveolar ridge, unspecified: Secondary | ICD-10-CM | POA: Insufficient documentation

## 2017-06-29 DIAGNOSIS — Z87820 Personal history of traumatic brain injury: Secondary | ICD-10-CM | POA: Insufficient documentation

## 2017-06-29 DIAGNOSIS — Z7984 Long term (current) use of oral hypoglycemic drugs: Secondary | ICD-10-CM | POA: Insufficient documentation

## 2017-06-29 DIAGNOSIS — I10 Essential (primary) hypertension: Secondary | ICD-10-CM | POA: Insufficient documentation

## 2017-06-29 DIAGNOSIS — Z23 Encounter for immunization: Secondary | ICD-10-CM

## 2017-06-29 DIAGNOSIS — E119 Type 2 diabetes mellitus without complications: Secondary | ICD-10-CM

## 2017-06-29 DIAGNOSIS — E1165 Type 2 diabetes mellitus with hyperglycemia: Secondary | ICD-10-CM | POA: Insufficient documentation

## 2017-06-29 LAB — GLUCOSE, POCT (MANUAL RESULT ENTRY): POC GLUCOSE: 126 mg/dL — AB (ref 70–99)

## 2017-06-29 MED ORDER — METFORMIN HCL 1000 MG PO TABS: 1000.0000 mg | ORAL_TABLET | Freq: Two times a day (BID) | ORAL | 3 refills | Status: DC

## 2017-06-29 MED FILL — metFORMIN HCL 1000 MG TABS: 1000 | 90 days supply | Qty: 180 | Fill #2

## 2017-06-29 MED FILL — ?METFORMIN HCL 1,000 MG TAB: 1000 | 30 days supply | Qty: 60 | Fill #0

## 2017-06-29 NOTE — Progress Notes (Signed)
Patient ID: [name], male    DOB: 07/04/1966  MRN: [id number]  CC: Follow-up   Subjective: [name] is a 51 y.o. male who presents for f/u sister, [name] is with him and interprets. His concerns today include:  Patient is a 51 year old male with history of diabetes type 2, hypertension and craniotomy due to intracranial bleed, cranial fractures and subdural hematomas earlier this yr.   1. Yesterday pt was very tired. Slept a lot. Sister was concerned about this -they did a lot the day before - went to church, to the mall and then to a birthday party. Got back at 10 p.m -he has been weaned off the [place] by neurologist. -Saw Dr. Naaman Plummer, PMR,last mth. Thought pt making good progress, pt pre-vocational Sister feels that he will be able to return to work because he has problems following directions eg has problems folding clothes the way she directs him and completing other simple task at home -she plans to start taking him to [facility] -pt denies CP/SOB/HA/dizziness  2. DM: BS 98-123. Checking once a day in mornings -good appetite -went for eye exam 2 wks ago but they could not do it because pt was not able to follow commands. Patient refuses to go back or to see another eye doctor  3. Dental referral Applied for [facility] card. Awaiting dental appointment Patient Active Problem List   Diagnosis Date Noted  . Attention and concentration deficit   . Seizure prophylaxis   . Type 2 diabetes mellitus with peripheral neuropathy (HCC)   . S/P craniotomy 03/19/2017  . Essential hypertension   . Dysphagia, oropharyngeal 02/01/2017  . Diabetes type 2, uncontrolled ([place]) 06/13/2015     Current Outpatient Prescriptions on File Prior to Visit  Medication Sig Dispense Refill  . acetaminophen (TYLENOL) 500 MG tablet Take 500 mg by mouth every 6 (six) hours as needed for moderate pain.    . Blood Glucose Monitoring Suppl (TRUE METRIX METER) w/Device KIT Use as directed 1  kit 0  . glucose blood (TRUE METRIX BLOOD GLUCOSE TEST) test strip Use as instructed 100 each 12  . levETIRAcetam (KEPPRA) 500 MG tablet Take 1 tablet (500 mg total) by mouth 2 (two) times daily. (Patient taking differently: Take 500 mg by mouth at bedtime. ) 60 tablet 1  . TRUEPLUS LANCETS 28G MISC Use as directed 100 each 6   No current facility-administered medications on file prior to visit.     Allergies  Allergen Reactions  . No Known Allergies   . Latex Itching and Rash    Social History   Social History  . Marital status: Single    Spouse name: N/A  . Number of children: N/A  . Years of education: N/A   Occupational History  . Not on file.   Social History Main Topics  . Smoking status: Never Smoker  . Smokeless tobacco: Never Used  . Alcohol use Yes     Comment: socially, not since accident  . Drug use: No  . Sexual activity: Not on file   Other Topics Concern  . Not on file   Social History Narrative  . No narrative on file    Family History  Problem Relation Age of Onset  . Diabetes Mother   . Diabetes Father     Past Surgical History:  Procedure Laterality Date  . CRANIOTOMY Right 01/13/2017   Procedure: 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, Placement of bone flap in abdomen;  Surgeon: Ashok Pall, MD;  Location: [place];  Service: Neurosurgery;  Laterality: Right;  . CRANIOTOMY Right 03/19/2017   Procedure: Cranioplasty with placement of bone flap from abdomen;  Surgeon: Eustace Moore, MD;  Location: [place];  Service: Neurosurgery;  Laterality: Right;    ROS: Review of Systems Negative except as stated above PHYSICAL EXAM: BP 100/64 (BP Location: Left Arm, Patient Position: Sitting, Cuff Size: Normal)   Pulse 95   Temp 98.6 F (37 C) (Oral)   Ht '5\' 3"'$  (1.6 m)   Wt 139 lb (63 kg)   BMI 24.62 kg/m   Wt Readings from Last 3 Encounters:  06/29/17 139 lb (63 kg)  04/19/17 134 lb (60.8 kg)  04/01/17 132 lb 6.4 oz  (60.1 kg)   Physical Exam  General appearance - patient in NAD Mental status -flat affect. Answers some questions. Patient has problems following commands when asked to take deep breaths so that I can listen to his lungs. Also had problems understanding and following command to take a deep breath and hold it Neck - supple, no significant adenopathy Chest - clear to auscultation, no wheezes, rales or rhonchi, symmetric air entry Heart - normal rate, regular rhythm, normal S1, S2, no murmurs, rubs, clicks or gallops Extremities - peripheral pulses normal, no pedal edema, no clubbing or cyanosis  Lab Results  Component Value Date   WBC 5.4 04/11/2017   HGB 10.9 (L) 04/11/2017   HCT 32.0 (L) 04/11/2017   MCV 82.1 04/11/2017   PLT 261 04/11/2017     Chemistry      Component Value Date/Time   NA 136 04/11/2017 1246   NA 139 04/01/2017 1444   K 4.1 04/11/2017 1246   CL 104 04/11/2017 1246   CO2 24 04/11/2017 1246   BUN 12 04/11/2017 1246   BUN 13 04/01/2017 1444   CREATININE 0.64 04/11/2017 1246   CREATININE 0.71 06/02/2016 1232      Component Value Date/Time   CALCIUM 8.9 04/11/2017 1246   ALKPHOS 44 04/02/2017 1316   AST 15 04/02/2017 1316   ALT 15 (L) 04/02/2017 1316   BILITOT 0.5 04/02/2017 1316   BILITOT 0.2 04/01/2017 1444      ASSESSMENT AND PLAN: 1. Hx of traumatic brain injury -Patient improved physically -Patient's mental capacity decline since brain injury. 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 I've encouraged her to discuss this further with Dr. Tessa Lerner on the next visit  2. Controlled type 2 diabetes mellitus without complication, without long-term current use of insulin (HCC) - Glucose (CBG) - metFORMIN (GLUCOPHAGE) 1000 MG tablet; Take 1 tablet (1,000 mg total) by mouth 2 (two) times daily with a meal.  Dispense: 180 tablet; Refill: 3  3. Gum disease Await dental appointment.  4. Need for Streptococcus  pneumoniae and influenza vaccination - Pneumococcal polysaccharide vaccine 23-valent greater than or equal to 2yo subcutaneous/IM - Flu Vaccine QUAD 6+ mos PF IM (Fluarix Quad PF)  On next visit I would like to recheck his CBC to see if anemia has improved. If he has the [facility] card at that time we can refer for colonoscopy for colon cancer screening.  Patient was given the opportunity to ask questions.  Patient verbalized understanding of the plan and was able to repeat key elements of the plan.   Orders Placed This Encounter  Procedures  . Pneumococcal polysaccharide vaccine 23-valent greater than or equal to 2yo subcutaneous/IM  . Flu  Vaccine QUAD 6+ mos PF IM (Fluarix Quad PF)  . Glucose (CBG)     Requested Prescriptions   Signed Prescriptions Disp Refills  . metFORMIN (GLUCOPHAGE) 1000 MG tablet 180 tablet 3    Sig: Take 1 tablet (1,000 mg total) by mouth 2 (two) times daily with a meal.    Return in about 4 months (around 10/29/2017).  Karle Plumber, MD, FACP

## 2017-07-06 ENCOUNTER — Encounter: Payer: Self-pay | Admitting: Physical Medicine & Rehabilitation

## 2017-09-15 ENCOUNTER — Encounter: Payer: Self-pay | Admitting: Physical Medicine & Rehabilitation

## 2017-10-13 ENCOUNTER — Encounter: Payer: Self-pay | Admitting: Physical Medicine & Rehabilitation

## 2017-10-29 ENCOUNTER — Ambulatory Visit: Payer: Self-pay | Attending: Internal Medicine | Admitting: Internal Medicine

## 2017-10-29 ENCOUNTER — Ambulatory Visit: Payer: Self-pay | Admitting: Licensed Clinical Social Worker

## 2017-10-29 ENCOUNTER — Encounter: Payer: Self-pay | Admitting: Internal Medicine

## 2017-10-29 VITALS — BP 123/84 | HR 81 | Temp 97.5°F | Resp 16 | Wt 140.7 lb

## 2017-10-29 DIAGNOSIS — Z1331 Encounter for screening for depression: Secondary | ICD-10-CM

## 2017-10-29 DIAGNOSIS — Z7984 Long term (current) use of oral hypoglycemic drugs: Secondary | ICD-10-CM | POA: Insufficient documentation

## 2017-10-29 DIAGNOSIS — S069X9S Unspecified intracranial injury with loss of consciousness of unspecified duration, sequela: Secondary | ICD-10-CM

## 2017-10-29 DIAGNOSIS — I10 Essential (primary) hypertension: Secondary | ICD-10-CM | POA: Insufficient documentation

## 2017-10-29 DIAGNOSIS — S069X9A Unspecified intracranial injury with loss of consciousness of unspecified duration, initial encounter: Secondary | ICD-10-CM | POA: Insufficient documentation

## 2017-10-29 DIAGNOSIS — Z87820 Personal history of traumatic brain injury: Secondary | ICD-10-CM | POA: Insufficient documentation

## 2017-10-29 DIAGNOSIS — F329 Major depressive disorder, single episode, unspecified: Secondary | ICD-10-CM | POA: Insufficient documentation

## 2017-10-29 DIAGNOSIS — R1312 Dysphagia, oropharyngeal phase: Secondary | ICD-10-CM | POA: Insufficient documentation

## 2017-10-29 DIAGNOSIS — D638 Anemia in other chronic diseases classified elsewhere: Secondary | ICD-10-CM | POA: Insufficient documentation

## 2017-10-29 DIAGNOSIS — D649 Anemia, unspecified: Secondary | ICD-10-CM | POA: Insufficient documentation

## 2017-10-29 DIAGNOSIS — E1142 Type 2 diabetes mellitus with diabetic polyneuropathy: Secondary | ICD-10-CM | POA: Insufficient documentation

## 2017-10-29 DIAGNOSIS — E1165 Type 2 diabetes mellitus with hyperglycemia: Secondary | ICD-10-CM | POA: Insufficient documentation

## 2017-10-29 LAB — GLUCOSE, POCT (MANUAL RESULT ENTRY): POC Glucose: 160 mg/dl — AB (ref 70–99)

## 2017-10-29 LAB — POCT GLYCOSYLATED HEMOGLOBIN (HGB A1C): Hemoglobin A1C: 5.8

## 2017-10-29 MED FILL — TRUE METRIX TEST STRIP: 30 days supply | Qty: 100 | Fill #2

## 2017-10-29 MED FILL — ?METFORMIN HCL 1,000 MG TAB: 1000 | 30 days supply | Qty: 60 | Fill #1

## 2017-10-29 NOTE — Patient Instructions (Signed)
You should work on providing more structure in his day.   La vida despus de una lesin cerebral traumtica Living With Traumatic Brain Injury Una lesin cerebral traumtica(LCT) es una lesin en el cerebro que puede ser leve, moderada o grave. Los sntomas de cualquier tipo de LCT pueden duran mucho tiempo (crnicos). Segn la zona del cerebro que se vea afectada, la LCT puede alterar la visin, la memoria, la concentracin, el habla, el equilibrio, el sentido del tacto y el sueo. Tambin puede causar sntomas crnicos como dolor de cabeza o [place] Villagemareos. Cmo hacer frente a los cambios en el estilo de vida Despus de una LCT, podra tener que hacer cambios en su estilo de vida para recuperarse de la mejor forma posible. El tiempo que tarde en recuperase y cun bien lo haga dependern de la gravedad de la lesin. El plan de recuperacin podra incluir lo siguiente:  Trabajar con especialistas para desarrollar un plan de rehabilitacin para ayudarlo a volver a Programme researcher, broadcasting/film/videorealizar sus actividades habituales. El equipo de atencin mdica podra estar conformado por: ? Fisioterapeutas o terapeutas ocupacionales. ? Fonoaudilogos. ? Asesores psicolgicos. ? Mdicos, como su mdico de atencin primaria o un neurlogo.  No asistir durante un tiempo al Aleen Campitrabajo o a la escuela, segn la lesin.  Evitar situaciones en las que pudiera ocurrir otra lesin en la cabeza, como practicar ftbol o ftbol americano, hockey, baloncesto, artes [place] Citymarciales, deportes de nieve cuesta abajo y andar a caballo. No realice estas actividades hasta que el mdico lo autorice.  Hacer reposo. El descanso favorece la curacin del cerebro. Haga lo siguiente: ? Duerma bien por la noche. Evite quedarse despierto hasta muy tarde por la noche. ? Durmase a la Smith Internationalmisma hora todos los das. ? Descanse Administratordurante el da. Tome siestas durante el da o descanse cuando se siente cansado.  Evite sobresforzar los ojos. Es posible que deba limitar el tiempo que  pasa trabajando en la computadora, mirando televisin y [place] Hillleyendo.  Busque maneras de Charity fundraisercontrolar el estrs. Esto puede incluir lo siguiente: ? Public house managervitar realizar actividades que causan estrs. ? Realizar respiraciones profundas, practicar yoga o meditar. ? Optometristscuchar msica o pasar tiempo al OGE Energyaire libre.  Hacer listas, colocar recordatorios o Chemical engineerutilizar un planificador diario para ayudar a Press photographersu memoria.  Darse mucho tiempo para Education officer, environmentalrealizar las tareas cotidianas, como ir de compras, pagar las cuentas y Chief of Stafflavar la ropa.  No conducir. Posiblemente la lesin afecte su capacidad para conducir de IT consultantmanera segura. ? Pida a familiares o amigos, o a un servicio de transporte que lo ayuden a trasladarse y asistir a sus consultas. ? Realcese una evaluacin profesional para controlar su capacidad para Company secretarymanejar. ? Recurra a servicios de ayuda para que lo ayuden a volver a Company secretarymanejar. Podran incluir equipos adaptativos y de capacitacin.  Siga estas instrucciones en su casa:  Tome los medicamentos de venta libre y los recetados solamente como se lo haya indicado el mdico. No tome aspirina ni antiinflamatorios, como ibuprofeno o naproxeno, a menos que lo haya autorizado el mdico.  No tome grandes cantidades de cafena. Quiz su organismo est ms sensible a la cafena luego de la lesin.  No use ningn producto que contenga nicotina o tabaco, como los cigarrillos, los cigarrillos electrnicos, los chicles de nicotina y los parches. Si necesita ayuda para dejar de fumar, consulte al mdico.  No consuma drogas.  Limite el consumo de alcohol a no ms de 1 medida por da si es mujer y no est Orthoptistembarazada, y 2 medidas si es hombre.  BorgWarnerUna medida equivale a 12 onzas de cerveza, 5onzas de vino o 1onzas de bebidas alcohlicas de alta graduacin.  No conduzca hasta que su mdico lo autorice.  Concurra a todas las visitas de control como se lo haya indicado el mdico. Esto es importante. Dnde encontrar apoyo:  Hable con su  empleador, compaeros de trabajo, docentes o asesores escolares sobre la lesin. Lucienne Capersrabajen juntos para Public librariandesarrollar un plan para Education officer, environmentalrealizar las tareas [place] Eastmientras se recupera.  Hable con otras personas que tambin hayan sufrido una LCT. nase a un grupo de apoyo donde Cisco[place] otras personas que hayan sufrido una LCT.  Dgales a sus familiares y amigos qu pueden hacer para ayudarlo. Por ejemplo, ayudarlo en su hogar o llevarlo a las consultas.  Si no puede continuar trabajando despus de la lesin, hable con un trabajador social sobre alternativas para satisfacer sus necesidades econmicas.  Busque otros recursos si es Software engineermilitar o un familiar, como: ? Centro de Hotel managerLesiones Cerebrales para Personal de Defensa y Marine scientistVeteranos(Defense and Veterans Brain Injury Center): [email or web address] ? Lnea de crisis para veteranos y Games developermilitares del Departamento de Asuntos de los Veteranos(Department of Consolidated EdisonVeterans Affairs Military and PPL CorporationVeterans Crisis Line): [phone] Preguntas para hacerle al mdico:  Qu tan grave es la lesin?  Cul es el plan de rehabilitacin?  Cul es la recuperacin prevista?  Cundo puedo regresar al Aleen Campitrabajo o la escuela?  Cundo puedo retomar mis actividades habituales, entre ellas, conducir? Comunquese con un mdico si:  Tiene sntomas nuevos o que empeoran, por ejemplo: ? Mareos. ? Dolor de Turkmenistancabeza. ? Ansiedad o depresin. ? Irritabilidad. ? Confusin. ? Temblores que no puede controlar (convulsiones). ? Sensibilidad extrema a la luz o al sonido. ? Nuseas o vmitos. Resumen  La lesin cerebral traumtica(LCT) es una lesin en el cerebro que puede alterar la visin, la memoria, la concentracin, el habla, el equilibrio, el sentido del tacto y el sueo. Tambin puede causar sntomas crnicos como dolor de cabeza o Ridge Farmmareos.  Despus de una LCT, podra tener que hacer muchos cambios en su estilo de vida para recuperarse de la mejor forma posible. El tiempo que tarde en recuperase y  cun bien lo haga dependern de la gravedad de la lesin.  Hable con su familia, amigos, empleador, compaeros de trabajo, docentes o asesores escolares sobre la lesin. Lucienne Capersrabajen juntos para Public librariandesarrollar un plan para Education officer, environmentalrealizar las tareas Empiremientras se recupera. Esta informacin no tiene Theme park managercomo fin reemplazar el consejo del mdico. Asegrese de hacerle al mdico cualquier pregunta que tenga. Document Released: 02/26/2017 Document Revised: 02/26/2017 Document Reviewed: 02/26/2017 Elsevier Interactive Patient Education  [employer]2018 Elsevier Inc.

## 2017-10-29 NOTE — BH Specialist Note (Signed)
Integrated Behavioral Health Initial Visit  MRN: [id number] Name: Jamie LeapMiguel Garcia [facility]  Number of Integrated Behavioral Health Clinician visits:: 1/6 Session Start time: 10:00 AM  Session End time: 10:20 AM Total time: 20 minutes  Type of Service: Integrated Behavioral Health- Individual/Family Interpretor:No. Interpretor Name and Language: N/A   Warm Hand Off Completed.       SUBJECTIVE: [name] is a 51 y.o. male accompanied by Sibling Patient was referred by Dr. Laural BenesJohnson for depression. Patient reports the following symptoms/concerns: little interest in doing things, decreased motivation, and sleeping for extended periods of time Duration of problem: March 2018; Severity of problem: moderate  OBJECTIVE: Mood: Appropriate and Affect: Appropriate Risk of harm to self or others: No plan to harm self or others  LIFE CONTEXT: Family and Social: Pt resides with sister and her family. He receives strong emotional and financial support from his family. School/Work: Pt is unemployed. He applied for Medicaid in March; however, has not heard anything since. Self-Care: Pt reports watching television Life Changes: Pt has ongoing medical conditions resulting from a traumatic brain injury from earlier this year.   GOALS ADDRESSED: Patient will: 1. Reduce symptoms of: depression 2. Increase knowledge and/or ability of: coping skills  3. Demonstrate ability to: Increase adequate support systems for patient/family  INTERVENTIONS: Interventions utilized: Solution-Focused Strategies, Supportive Counseling, Psychoeducation and/or Health Education and Link to WalgreenCommunity Resources  Standardized Assessments completed: GAD-7 and PHQ 2&9  ASSESSMENT: Patient currently experiencing depression triggered by ongoing medical conditions resulting from a traumatic brain injury from earlier this year. Pt's sister provided majority of the history for patient. She reports pt having little  interest in doing things, decreased motivation, and sleeping for extended periods of time. Denies SI/HI/AVH. He receives strong support from family.    Patient may benefit from psychoeducation, psychotherapy, and medication management. LCSWA educated pt and family on the correlation between one's physical and mental health. Due to TBI, pt has limited independence and is unable to continue doing activities he enjoyed (I.e. Wrestling) LCSWA discussed benefits of applying healthy coping skills to decrease symptoms. Family was provided resources for local support groups for TBI victims and family, V[facility] MixerMonarch to assist with psychotherapy, and information on Brain Injury Association of [place] to strengthen support systems. Family is not interested in initiating medication management at this time.   PLAN: 1. Follow up with behavioral health clinician on : Pt was encouraged to contact LCSWA if symptoms worsen or fail to improve to [facility] 2. Behavioral recommendations: LCSWA recommends that pt apply healthy coping skills discussed and utilize provided resources. Pt is encouraged to schedule follow up appointment with LCSWA 3. Referral(s): ParamedicCommunity Mental Health Services (LME/Outside Clinic) and TBI supportive resources 4. "From scale of 1-10, how likely are you to follow plan?":   Bridgett LarssonJasmine D Timmya Blazier, LCSW 11/01/17 11:27 AM

## 2017-10-29 NOTE — Progress Notes (Signed)
Patient ID: [name], male    DOB: 08/13/1966  MRN: [id number]  CC: Diabetes and Hypertension   Subjective: [name] is a 51 y.o. male who presents for chronic ds management. Sister is with him and interprets.  His concerns today include:  Patient is a 51 year old male with history of diabetes type 2, hypertension and craniotomy due to intracranial bleed, cranial fractures and subdural hematomas earlier this yr.   1. Sister reports that he is sleeping too much -pt just wants to eat and sleep. Does not want to go out in cold or rainy weather. Family tries to take him out of the house as much as they can. But sister has her own family (spouse and kids) to attend to -sister forces him to get on stationary bike 2 x a wk for exercise.  -pt denies feeling depress. PHQ 9 score today is 7 -Keppra d/c by neurologist -sister reports he does not snore much -applied for Medicaid but no word yet   2. DM: -checking BS once a day.  BS have been good except for 180 earlier this wk -sister did not try to get him back for eye exam. Pt had problems following commands  Patient Active Problem List   Diagnosis Date Noted  . Attention and concentration deficit   . Seizure prophylaxis   . Type 2 diabetes mellitus with peripheral neuropathy (HCC)   . S/P craniotomy 03/19/2017  . Essential hypertension   . Dysphagia, oropharyngeal 02/01/2017  . Diabetes type 2, uncontrolled ([place]) 06/13/2015     Current Outpatient Medications on File Prior to Visit  Medication Sig Dispense Refill  . acetaminophen (TYLENOL) 500 MG tablet Take 500 mg by mouth every 6 (six) hours as needed for moderate pain.    . Blood Glucose Monitoring Suppl (TRUE METRIX METER) w/Device KIT Use as directed 1 kit 0  . glucose blood (TRUE METRIX BLOOD GLUCOSE TEST) test strip Use as instructed 100 each 12  . metFORMIN (GLUCOPHAGE) 1000 MG tablet Take 1 tablet (1,000 mg total) by mouth 2 (two) times daily with  a meal. 180 tablet 3  . TRUEPLUS LANCETS 28G MISC Use as directed 100 each 6   No current facility-administered medications on file prior to visit.     Allergies  Allergen Reactions  . No Known Allergies   . Latex Itching and Rash    Social History   Socioeconomic History  . Marital status: Single    Spouse name: Not on file  . Number of children: Not on file  . Years of education: Not on file  . Highest education level: Not on file  Social Needs  . Financial resource strain: Not on file  . Food insecurity - worry: Not on file  . Food insecurity - inability: Not on file  . Transportation needs - medical: Not on file  . Transportation needs - non-medical: Not on file  Occupational History  . Not on file  Tobacco Use  . Smoking status: Never Smoker  . Smokeless tobacco: Never Used  Substance and Sexual Activity  . Alcohol use: Yes    Comment: socially, not since accident  . Drug use: No  . Sexual activity: Not on file  Other Topics Concern  . Not on file  Social History Narrative  . Not on file    Family History  Problem Relation Age of Onset  . Diabetes Mother   . Diabetes Father     Past Surgical History:  Procedure Laterality Date  . CRANIOTOMY Right 01/13/2017   Procedure: 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, Placement of bone flap in abdomen;  Surgeon: Ashok Pall, MD;  Location: [place];  Service: Neurosurgery;  Laterality: Right;  . CRANIOTOMY Right 03/19/2017   Procedure: Cranioplasty with placement of bone flap from abdomen;  Surgeon: Eustace Moore, MD;  Location: [place];  Service: Neurosurgery;  Laterality: Right;    ROS: Review of Systems Neg except as above PHYSICAL EXAM: BP 123/84   Pulse 81   Temp (!) 97.5 F (36.4 C) (Oral)   Resp 16   Wt 140 lb 11.2 oz (63.8 kg)   SpO2 100%   BMI 24.92 kg/m   Wt Readings from Last 3 Encounters:  10/29/17 140 lb 11.2 oz (63.8 kg)  06/29/17 139 lb (63 kg)  04/19/17 134 lb  (60.8 kg)    Physical Exam  General appearance - alert, well appearing, and in no distress Mental status - flat and quiet affect. Does not know day of wk or year Neck - supple, no significant adenopathy Chest - clear to auscultation, no wheezes, rales or rhonchi, symmetric air entry Heart - normal rate, regular rhythm, normal S1, S2, no murmurs, rubs, clicks or gallops Neurological - Grip 3 on LT, 5/5 RT LUE4/5 LEs 5/5 Wide base waddling gate Extremities -no LE edema   Results for orders placed or performed in visit on 10/29/17  POCT glucose (manual entry)  Result Value Ref Range   POC Glucose 160 (A) 70 - 99 mg/dl  POCT glycosylated hemoglobin (Hb A1C)  Result Value Ref Range   Hemoglobin A1C 5.8    Lab Results  Component Value Date   WBC 5.4 04/11/2017   HGB 10.9 (L) 04/11/2017   HCT 32.0 (L) 04/11/2017   MCV 82.1 04/11/2017   PLT 261 04/11/2017    Depression screen PHQ 2/9 10/29/2017 06/29/2017 05/04/2017 04/19/2017 04/01/2017  Decreased Interest 2 0 0 0 0  Down, Depressed, Hopeless 0 0 0 0 0  PHQ - 2 Score 2 0 0 0 0  Altered sleeping 3 0 0 3 3  Tired, decreased energy 0 3 1 0 3  Change in appetite 0 0 0 0 0  Feeling bad or failure about yourself  0 0 0 0 0  Trouble concentrating 2 0 1 0 3  Moving slowly or fidgety/restless 0 0 0 0 0  Suicidal thoughts 0 0 0 0 0  PHQ-9 Score '7 3 2 3 9    '$ ASSESSMENT AND PLAN: 1. Type 2 diabetes mellitus with peripheral neuropathy (HCC) At goal. Cont Metformin - POCT glucose (manual entry) - POCT glycosylated hemoglobin (Hb A1C) - Microalbumin / creatinine urine ratio  2. Traumatic brain injury with loss of consciousness, sequela ([place]) Most of his symptoms are due to traumatic brain injury that caused the intracranial bleed earlier this year.  I also think he has a component of depression and would benefit from being on antidepressant.  However sister declines that at this time. -Patient may also benefit from having more structure  to his day.  Sister is doing her best to provide that but is limited given that she has a family of her own.  I will have our LCSW meet with them today to see whether there are any programs in the area that allow for a day program that he can go to several days a week tto help get him out of the house and provide some  structure for him  3. Anemia, unspecified type - CBC - Iron, TIBC and Ferritin Panel  4. Positive depression screening Pt and sister declines putting him on antidepressant   Patient was given the opportunity to ask questions.  Patient verbalized understanding of the plan and was able to repeat key elements of the plan.   Orders Placed This Encounter  Procedures  . Microalbumin / creatinine urine ratio  . CBC  . Iron, TIBC and Ferritin Panel  . POCT glucose (manual entry)  . POCT glycosylated hemoglobin (Hb A1C)     Requested Prescriptions    No prescriptions requested or ordered in this encounter    Return in about 4 months (around 02/27/2018).  Karle Plumber, MD, FACP

## 2017-10-30 LAB — IRON,TIBC AND FERRITIN PANEL
Ferritin: 100 ng/mL (ref 30–400)
IRON SATURATION: 30 % (ref 15–55)
IRON: 100 ug/dL (ref 38–169)
Total Iron Binding Capacity: 337 ug/dL (ref 250–450)
UIBC: 237 ug/dL (ref 111–343)

## 2017-10-30 LAB — CBC
HEMATOCRIT: 39.2 % (ref 37.5–51.0)
Hemoglobin: 13.9 g/dL (ref 13.0–17.7)
MCH: 30.3 pg (ref 26.6–33.0)
MCHC: 35.5 g/dL (ref 31.5–35.7)
MCV: 85 fL (ref 79–97)
PLATELETS: 260 10*3/uL (ref 150–379)
RBC: 4.59 x10E6/uL (ref 4.14–5.80)
RDW: 13.4 % (ref 12.3–15.4)
WBC: 4.7 10*3/uL (ref 3.4–10.8)

## 2017-10-30 LAB — MICROALBUMIN / CREATININE URINE RATIO
CREATININE, UR: 171 mg/dL
Microalb/Creat Ratio: 87.7 mg/g creat — ABNORMAL HIGH (ref 0.0–30.0)
Microalbumin, Urine: 150 ug/mL

## 2017-11-01 ENCOUNTER — Telehealth: Payer: Self-pay

## 2017-11-01 NOTE — Telephone Encounter (Signed)
[employer] Jerene BearsLeonardo [id number] contacted pt sister to go over results pt sister is aware and doesn't have any questions or concerns

## 2017-11-22 ENCOUNTER — Ambulatory Visit: Payer: Self-pay | Admitting: Physical Medicine & Rehabilitation

## 2017-11-25 MED FILL — ?METFORMIN HCL 1,000 MG TAB: 1000 | 30 days supply | Qty: 60 | Fill #2

## 2017-12-08 ENCOUNTER — Encounter: Payer: Self-pay | Admitting: Physical Medicine & Rehabilitation

## 2017-12-08 ENCOUNTER — Encounter: Payer: Self-pay | Attending: Physical Medicine & Rehabilitation | Admitting: Physical Medicine & Rehabilitation

## 2017-12-08 VITALS — BP 120/79 | HR 85

## 2017-12-08 DIAGNOSIS — I10 Essential (primary) hypertension: Secondary | ICD-10-CM | POA: Insufficient documentation

## 2017-12-08 DIAGNOSIS — Z7409 Other reduced mobility: Secondary | ICD-10-CM | POA: Insufficient documentation

## 2017-12-08 DIAGNOSIS — S0219XD Other fracture of base of skull, subsequent encounter for fracture with routine healing: Secondary | ICD-10-CM | POA: Insufficient documentation

## 2017-12-08 DIAGNOSIS — F329 Major depressive disorder, single episode, unspecified: Secondary | ICD-10-CM | POA: Insufficient documentation

## 2017-12-08 DIAGNOSIS — Z9889 Other specified postprocedural states: Secondary | ICD-10-CM | POA: Insufficient documentation

## 2017-12-08 DIAGNOSIS — I69198 Other sequelae of nontraumatic intracerebral hemorrhage: Secondary | ICD-10-CM | POA: Insufficient documentation

## 2017-12-08 DIAGNOSIS — E1142 Type 2 diabetes mellitus with diabetic polyneuropathy: Secondary | ICD-10-CM | POA: Insufficient documentation

## 2017-12-08 DIAGNOSIS — F419 Anxiety disorder, unspecified: Secondary | ICD-10-CM | POA: Insufficient documentation

## 2017-12-08 DIAGNOSIS — Z833 Family history of diabetes mellitus: Secondary | ICD-10-CM | POA: Insufficient documentation

## 2017-12-08 DIAGNOSIS — S062X3S Diffuse traumatic brain injury with loss of consciousness of 1 hour to 5 hours 59 minutes, sequela: Secondary | ICD-10-CM

## 2017-12-08 NOTE — Patient Instructions (Signed)
WORK ON A WEEKLY ROUTINE WITH CHORES, EXERCISE AND OTHER THINGS WHICH HE IS SUPPOSED TO DO DAILY. HAVE HIM CHECK OFF THE ACTIVITY WHEN IT'S DONE.

## 2017-12-08 NOTE — Progress Notes (Signed)
Subjective:    Patient ID: [name], male    DOB: [birth year], 52 y.o.   MRN: [id number]  HPI   [name] is here in follow up of his TBI. He has been doing fairly well although his wife reports that he has been depressed at times and wanted to sit around the house a lot. He wouldn't go out or go to the gym. His wife was persistent and has gotten him up and now he's been going to the gym and more active at home. He does some small chores.   I asked [name] if he was depressed and he said no.  Wife states that he will not initiate much on his own but does do better when cued or reminded.  She does see that he has short-term memory issues ongoing.  He denies headaches.  Sleep is been generally good.  Bowel and bladder function is normal.   Pain Inventory Average Pain 0 Pain Right Now 0 My pain is .  In the last 24 hours, has pain interfered with the following? General activity 5 Relation with others 8 Enjoyment of life 8 What TIME of day is your pain at its worst? . Sleep (in general) Good  Pain is worse with: na Pain improves with: na Relief from Meds: na  Mobility walk without assistance ability to climb steps?  yes do you drive?  no  Function disabled: date disabled . I need assistance with the following:  meal prep, household duties and shopping  Neuro/Psych depression anxiety  Prior Studies Any changes since last visit?  no  Physicians involved in your care Any changes since last visit?  no   Family History  Problem Relation Age of Onset  . Diabetes Mother   . Diabetes Father    Social History   Socioeconomic History  . Marital status: Single    Spouse name: Not on file  . Number of children: Not on file  . Years of education: Not on file  . Highest education level: Not on file  Social Needs  . Financial resource strain: Not on file  . Food insecurity - worry: Not on file  . Food insecurity - inability: Not on file  . Transportation  needs - medical: Not on file  . Transportation needs - non-medical: Not on file  Occupational History  . Not on file  Tobacco Use  . Smoking status: Never Smoker  . Smokeless tobacco: Never Used  Substance and Sexual Activity  . Alcohol use: Yes    Comment: socially, not since accident  . Drug use: No  . Sexual activity: Not on file  Other Topics Concern  . Not on file  Social History Narrative  . Not on file   Past Surgical History:  Procedure Laterality Date  . CRANIOTOMY Right 01/13/2017   Procedure: 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, Placement of bone flap in abdomen;  Surgeon: Coletta Memos, MD;  [facility];  Service: Neurosurgery;  Laterality: Right;  . CRANIOTOMY Right 03/19/2017   Procedure: Cranioplasty with placement of bone flap from abdomen;  Surgeon: Tia Alert, MD;  Location[facility];  Service: Neurosurgery;  Laterality: Right;   Past Medical History:  Diagnosis Date  . Diabetes mellitus without complication (HCC)    There were no vitals taken for this visit.  Opioid Risk Score:   Fall Risk Score:  `1  Depression screen PHQ 2/9  Depression screen [facility] 2/9 10/29/2017 06/29/2017 05/04/2017 04/19/2017  04/01/2017 07/31/2016 07/27/2016  Decreased Interest 2 0 0 0 0 0 0  Down, Depressed, Hopeless 0 0 0 0 0 0 0  PHQ - 2 Score 2 0 0 0 0 0 0  Altered sleeping 3 0 0 3 3 - -  Tired, decreased energy 0 3 1 0 3 - -  Change in appetite 0 0 0 0 0 - -  Feeling bad or failure about yourself  0 0 0 0 0 - -  Trouble concentrating 2 0 1 0 3 - -  Moving slowly or fidgety/restless 0 0 0 0 0 - -  Suicidal thoughts 0 0 0 0 0 - -  PHQ-9 Score 7 3 2 3 9  - -     Review of Systems  Constitutional: Negative.   HENT: Negative.   Eyes: Negative.   Respiratory: Negative.   Cardiovascular: Negative.   Gastrointestinal: Negative.   Endocrine: Negative.   Genitourinary: Negative.   Musculoskeletal: Negative.   Skin: Negative.     Allergic/Immunologic: Negative.   Neurological: Negative.   Hematological: Negative.   Psychiatric/Behavioral: Negative.   All other systems reviewed and are negative.      Objective:   Physical Exam  Constitutional: He appears well-developed and well-nourished.  HENT:  Eyes: PERRL   Pupils round and reactive to light Neck: Normal range of motion. Neck supple. No thyromegaly present.  Cardiovascular: Regular rate.  Respiratory: Normal effort GI: Soft. Bowel sounds are normal. He exhibits no distension. Abdominal incision clean and dry Musculoskeletal: He exhibits no edema or tenderness.  Neurological: He is alert.  Motor: nearly 5/5 in all fours.  Still with some apraxia in the left arm and leg.  Ambulated still with steppage gait with the right side actually more affected than the left.  With some cueing he was able to strike with heel more effectively.  Balance was stable.  He did not using a adaptive device to help him stay upright.   In Spanish able to answer basic questions..  Insight and awareness.   Skin: Skin is warm and dry.  Psychiatric: Pleasant and cooperative      Assessment & Plan:  1. Decreased functional mobility secondary to right intraparenchymal hemorrhage-SDH-temporal bone fracture after fall. Status post right frontotemporoparietal craniectomy 01/14/2017 with cranioplasty 03/19/2017             -continues to make nice gains.    -DISCUSSED MAKING A DAILY/WEEKLY SCHEDULE. Needs to develop routine to help build his memory/functionality.    -Reviewed gait mechanics with wife and patient.  Suggested use of video to help give him feedback.   -He remains prevocational.  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 2. Seizure prophylaxis. Off keppra 3. Diabetes mellitus peripheral neuropathy.  4. Hypertension. bp controlled off meds  15 minutes of  face to face patient care time were spent during this visit. All questions were encouraged and answered. Follow up in 4 months.

## 2017-12-15 MED FILL — ?METFORMIN HCL 1,000 MG TAB: 1000 | 30 days supply | Qty: 60 | Fill #3

## 2018-01-27 MED FILL — ?METFORMIN HCL 1,000 MG TAB: 1000 | 30 days supply | Qty: 60 | Fill #4

## 2018-02-25 MED FILL — ?METFORMIN HCL 1,000 MG TAB: 1000 | 30 days supply | Qty: 60 | Fill #5

## 2018-03-04 ENCOUNTER — Ambulatory Visit: Payer: Self-pay | Attending: Internal Medicine | Admitting: Internal Medicine

## 2018-03-04 ENCOUNTER — Encounter: Payer: Self-pay | Admitting: Internal Medicine

## 2018-03-04 VITALS — BP 108/69 | HR 7 | Temp 98.7°F | Resp 16 | Wt 143.2 lb

## 2018-03-04 DIAGNOSIS — Z1211 Encounter for screening for malignant neoplasm of colon: Secondary | ICD-10-CM | POA: Insufficient documentation

## 2018-03-04 DIAGNOSIS — E119 Type 2 diabetes mellitus without complications: Secondary | ICD-10-CM

## 2018-03-04 DIAGNOSIS — Z91040 Latex allergy status: Secondary | ICD-10-CM | POA: Insufficient documentation

## 2018-03-04 DIAGNOSIS — S069X9S Unspecified intracranial injury with loss of consciousness of unspecified duration, sequela: Secondary | ICD-10-CM | POA: Insufficient documentation

## 2018-03-04 DIAGNOSIS — I10 Essential (primary) hypertension: Secondary | ICD-10-CM | POA: Insufficient documentation

## 2018-03-04 DIAGNOSIS — Z7984 Long term (current) use of oral hypoglycemic drugs: Secondary | ICD-10-CM | POA: Insufficient documentation

## 2018-03-04 DIAGNOSIS — X58XXXS Exposure to other specified factors, sequela: Secondary | ICD-10-CM | POA: Insufficient documentation

## 2018-03-04 DIAGNOSIS — Z833 Family history of diabetes mellitus: Secondary | ICD-10-CM | POA: Insufficient documentation

## 2018-03-04 DIAGNOSIS — E1142 Type 2 diabetes mellitus with diabetic polyneuropathy: Secondary | ICD-10-CM | POA: Insufficient documentation

## 2018-03-04 LAB — GLUCOSE, POCT (MANUAL RESULT ENTRY): POC Glucose: 175 mg/dl — AB (ref 70–99)

## 2018-03-04 MED ORDER — METFORMIN HCL 1000 MG PO TABS: 1000.0000 mg | ORAL_TABLET | Freq: Two times a day (BID) | ORAL | 3 refills | Status: DC

## 2018-03-04 NOTE — Progress Notes (Signed)
Patient ID: [name], male    DOB: 01-18-1966  MRN: [id number]  CC: Diabetes   Subjective: [name] is a 52 y.o. male who presents for chronic ds management.  Sister is with him and interprets.  His concerns today include:  Patient is a 52 year old male with history of diabetes type 2,hypertension and craniotomy due tointracranial bleed, cranial fractures and subdural hematomas2018.  1.  On last visit we spoke about trying to get patient into a structured day program.  Sister has family membership at [employer].  She takes him 3-4 x a wk.  Uses stationary bikes and participates in Zumba classes.  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  DM: checks BS once a day.  Range 114-120.  Highest was 140.  Sister tries to keep him on a regulated diet but patient sneaks into the cupboard and eats cookies and marsh mellows which are snacks for his young nieces and nephews. Had diarrhea for 2 wks up until several days ago.  Had one incontinence episode during the diarrhea episode Compliant with Metformin Patient Active Problem List   Diagnosis Date Noted  . Traumatic brain injury with loss of consciousness ([place]) 10/29/2017  . Anemia 10/29/2017  . Positive depression screening 10/29/2017  . Attention and concentration deficit   . Seizure prophylaxis   . Type 2 diabetes mellitus with peripheral neuropathy (HCC)   . S/P craniotomy 03/19/2017  . Essential hypertension   . Dysphagia, oropharyngeal 02/01/2017  . Diabetes type 2, uncontrolled ([place]) 06/13/2015     Current Outpatient Medications on File Prior to Visit  Medication Sig Dispense Refill  . Blood Glucose Monitoring Suppl (TRUE METRIX METER) w/Device KIT Use as directed 1 kit 0  . glucose blood (TRUE METRIX BLOOD GLUCOSE TEST) test strip  Use as instructed 100 each 12  . TRUEPLUS LANCETS 28G MISC Use as directed 100 each 6   No current facility-administered medications on file prior to visit.     Allergies  Allergen Reactions  . No Known Allergies   . Latex Itching and Rash    Social History   Socioeconomic History  . Marital status: Single    Spouse name: Not on file  . Number of children: Not on file  . Years of education: Not on file  . Highest education level: Not on file  Occupational History  . Not on file  Social Needs  . Financial resource strain: Not on file  . Food insecurity:    Worry: Not on file    Inability: Not on file  . Transportation needs:    Medical: Not on file    Non-medical: Not on file  Tobacco Use  . Smoking status: Never Smoker  . Smokeless tobacco: Never Used  Substance and Sexual Activity  . Alcohol use: Yes    Comment: socially, not since accident  . Drug use: No  . Sexual activity: Not on file  Lifestyle  . Physical activity:    Days per week: Not on file    Minutes per session: Not on file  . Stress: Not on file  Relationships  . Social connections:    Talks on phone: Not on file    Gets together: Not on file    Attends religious service: Not on file  Active member of club or organization: Not on file    Attends meetings of clubs or organizations: Not on file    Relationship status: Not on file  . Intimate partner violence:    Fear of current or ex partner: Not on file    Emotionally abused: Not on file    Physically abused: Not on file    Forced sexual activity: Not on file  Other Topics Concern  . Not on file  Social History Narrative  . Not on file    Family History  Problem Relation Age of Onset  . Diabetes Mother   . Diabetes Father     Past Surgical History:  Procedure Laterality Date  . CRANIOTOMY Right 01/13/2017   Procedure: 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, Placement of bone flap in abdomen;  Surgeon: Ashok Pall, MD;  Location: [place];  Service: Neurosurgery;  Laterality: Right;  . CRANIOTOMY Right 03/19/2017   Procedure: Cranioplasty with placement of bone flap from abdomen;  Surgeon: Eustace Moore, MD;  Location: [place];  Service: Neurosurgery;  Laterality: Right;    ROS: Review of Systems Negative except as stated above PHYSICAL EXAM: BP 108/69   Pulse (!) 7   Temp 98.7 F (37.1 C) (Oral)   Resp 16   Wt 143 lb 3.2 oz (65 kg)   SpO2 97%   BMI 25.37 kg/m   Physical Exam   General appearance -patient is in NAD.  He is a little bit more interactive today.   Neck - supple, no significant adenopathy Chest - clear to auscultation, no wheezes, rales or rhonchi, symmetric air entry Heart - normal rate, regular rhythm, normal S1, S2, no murmurs, rubs, clicks or gallops Extremities - peripheral pulses normal, no pedal edema, no clubbing or cyanosis  BS 175 Lab Results  Component Value Date   HGBA1C 5.8 10/29/2017    ASSESSMENT AND PLAN: 1. Controlled type 2 diabetes mellitus without complication, without long-term current use of insulin ([place]) -Observe for now.   if the diarrhea returns we may have to can consider lowering the dose of metformin - POCT glucose (manual entry) - metFORMIN (GLUCOPHAGE) 1000 MG tablet; Take 1 tablet (1,000 mg total) by mouth 2 (two) times daily with a meal.  Dispense: 180 tablet; Refill: 3  2. Traumatic brain injury with loss of consciousness, sequela ([place]) I am pleased that they are getting him out of the house several times a week.  In regards to having him do some light duties with his cousin I am in favor of that.  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  3. Colon cancer screening - Fecal occult blood, imunochemical  Patient was given the opportunity to ask questions.  Patient verbalized understanding of the plan and was able to repeat key elements of the plan.   Orders Placed This  Encounter  Procedures  . Fecal occult blood, imunochemical  . POCT glucose (manual entry)     Requested Prescriptions   Signed Prescriptions Disp Refills  . metFORMIN (GLUCOPHAGE) 1000 MG tablet 180 tablet 3    Sig: Take 1 tablet (1,000 mg total) by mouth 2 (two) times daily with a meal.    Return in about 4 months (around 07/05/2018).  Karle Plumber, MD, FACP

## 2018-03-07 ENCOUNTER — Encounter: Payer: Self-pay | Admitting: Physical Medicine & Rehabilitation

## 2018-03-08 ENCOUNTER — Ambulatory Visit: Payer: Self-pay | Attending: Internal Medicine

## 2018-03-22 ENCOUNTER — Encounter: Payer: Self-pay | Admitting: Physical Medicine & Rehabilitation

## 2018-03-22 ENCOUNTER — Ambulatory Visit: Payer: Self-pay | Attending: Internal Medicine

## 2018-03-22 ENCOUNTER — Encounter: Payer: Self-pay | Attending: Physical Medicine & Rehabilitation | Admitting: Physical Medicine & Rehabilitation

## 2018-03-22 VITALS — BP 122/78 | HR 76 | Ht 61.0 in | Wt 139.0 lb

## 2018-03-22 DIAGNOSIS — I10 Essential (primary) hypertension: Secondary | ICD-10-CM | POA: Insufficient documentation

## 2018-03-22 DIAGNOSIS — F419 Anxiety disorder, unspecified: Secondary | ICD-10-CM | POA: Insufficient documentation

## 2018-03-22 DIAGNOSIS — S062X3S Diffuse traumatic brain injury with loss of consciousness of 1 hour to 5 hours 59 minutes, sequela: Secondary | ICD-10-CM

## 2018-03-22 DIAGNOSIS — S0990XS Unspecified injury of head, sequela: Secondary | ICD-10-CM

## 2018-03-22 DIAGNOSIS — E1142 Type 2 diabetes mellitus with diabetic polyneuropathy: Secondary | ICD-10-CM | POA: Insufficient documentation

## 2018-03-22 DIAGNOSIS — Z833 Family history of diabetes mellitus: Secondary | ICD-10-CM | POA: Insufficient documentation

## 2018-03-22 DIAGNOSIS — E119 Type 2 diabetes mellitus without complications: Secondary | ICD-10-CM | POA: Insufficient documentation

## 2018-03-22 DIAGNOSIS — R5383 Other fatigue: Secondary | ICD-10-CM

## 2018-03-22 DIAGNOSIS — F329 Major depressive disorder, single episode, unspecified: Secondary | ICD-10-CM | POA: Insufficient documentation

## 2018-03-22 DIAGNOSIS — I69198 Other sequelae of nontraumatic intracerebral hemorrhage: Secondary | ICD-10-CM | POA: Insufficient documentation

## 2018-03-22 DIAGNOSIS — Z9889 Other specified postprocedural states: Secondary | ICD-10-CM | POA: Insufficient documentation

## 2018-03-22 DIAGNOSIS — Z7409 Other reduced mobility: Secondary | ICD-10-CM | POA: Insufficient documentation

## 2018-03-22 DIAGNOSIS — S0219XD Other fracture of base of skull, subsequent encounter for fracture with routine healing: Secondary | ICD-10-CM | POA: Insufficient documentation

## 2018-03-22 NOTE — Patient Instructions (Signed)
Try caffeine in morning and perhaps around lunchtime to help with energy   Keep doing what you're doing otherwise.

## 2018-03-22 NOTE — Progress Notes (Signed)
Patient here for lab visit only 

## 2018-03-22 NOTE — Progress Notes (Signed)
Subjective:    Patient ID: [name], male    DOB: 04-27-1966, 52 y.o.   MRN: [id number]  HPI   Candelario is here in follow up of his TBI. I last saw him in February. He is initiating more at home. His mood has been better. There is still problems with fatigue during the day. He likes to nap during the mid day. Wife states he is sleeping well.   He is not having any pain.   [facility] is following his diabetes. Wife state that sugars have been well controlled.   Pain Inventory Average Pain 0 Pain Right Now 0 My pain is na  In the last 24 hours, has pain interfered with the following? General activity 0 Relation with others 0 Enjoyment of life 0 What TIME of day is your pain at its worst? na Sleep (in general) Good  Pain is worse with: na Pain improves with: na Relief from Meds: na  Mobility walk without assistance ability to climb steps?  yes  Function not employed: date last employed .  Neuro/Psych bowel control problems  Prior Studies Any changes since last visit?  no  Physicians involved in your care Any changes since last visit?  no   Family History  Problem Relation Age of Onset  . Diabetes Mother   . Diabetes Father    Social History   Socioeconomic History  . Marital status: Single    Spouse name: Not on file  . Number of children: Not on file  . Years of education: Not on file  . Highest education level: Not on file  Occupational History  . Not on file  Social Needs  . Financial resource strain: Not on file  . Food insecurity:    Worry: Not on file    Inability: Not on file  . Transportation needs:    Medical: Not on file    Non-medical: Not on file  Tobacco Use  . Smoking status: Never Smoker  . Smokeless tobacco: Never Used  Substance and Sexual Activity  . Alcohol use: Yes    Comment: socially, not since accident  . Drug use: No  . Sexual activity: Not on file  Lifestyle  . Physical activity:   Days per week: Not on file    Minutes per session: Not on file  . Stress: Not on file  Relationships  . Social connections:    Talks on phone: Not on file    Gets together: Not on file    Attends religious service: Not on file    Active member of club or organization: Not on file    Attends meetings of clubs or organizations: Not on file    Relationship status: Not on file  Other Topics Concern  . Not on file  Social History Narrative  . Not on file   Past Surgical History:  Procedure Laterality Date  . CRANIOTOMY Right 01/13/2017   Procedure: 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, Placement of bone flap in abdomen;  Surgeon: Coletta Memos, MD;  Location: [facility] OR;  Service: Neurosurgery;  Laterality: Right;  . CRANIOTOMY Right 03/19/2017   Procedure: Cranioplasty with placement of bone flap from abdomen;  Surgeon: Tia Alert, MD;  Location: [facility] OR;  Service: Neurosurgery;  Laterality: Right;   Past Medical History:  Diagnosis Date  . Diabetes mellitus without complication (HCC)    There were no vitals taken for this visit.  Opioid Risk Score:  Fall Risk Score:  `1  Depression screen PHQ 2/9  Depression screen [facility] 2/9 03/04/2018 10/29/2017 06/29/2017 05/04/2017 04/19/2017 04/01/2017 07/31/2016  Decreased Interest 2 2 0 0 0 0 0  Down, Depressed, Hopeless 0 0 0 0 0 0 0  PHQ - 2 Score 2 2 0 0 0 0 0  Altered sleeping 1 3 0 0 3 3 -  Tired, decreased energy 0 0 3 1 0 3 -  Change in appetite 2 0 0 0 0 0 -  Feeling bad or failure about yourself  0 0 0 0 0 0 -  Trouble concentrating 0 2 0 1 0 3 -  Moving slowly or fidgety/restless 0 0 0 0 0 0 -  Suicidal thoughts 0 0 0 0 0 0 -  PHQ-9 Score -     Review of Systems  Constitutional: Negative.   HENT: Negative.   Eyes: Negative.   Respiratory: Negative.   Cardiovascular: Negative.   Gastrointestinal: Negative.   Endocrine: Negative.   Genitourinary: Negative.   Musculoskeletal: Negative.     Skin: Negative.   Allergic/Immunologic: Negative.   Neurological: Negative.   Hematological: Negative.   Psychiatric/Behavioral: Negative.   All other systems reviewed and are negative.      Objective:   Physical Exam General: No acute distress HEENT: EOMI, oral membranes moist Cards: reg rate  Chest: normal effort Abdomen: Soft, NT, ND Skin: dry, intact Extremities: no edema Musculoskeletal: He exhibits no edema or tenderness.  Neurological: He is alert.  Motor: nearly 5/5 in all fours.  insight and awareness somewhat limited. Balance improved.  Skin: Skin is warm and dry.  Psychiatric: Pleasant and cooperative    Assessment & Plan:  1. Decreased functional mobility secondary to right intraparenchymal hemorrhage-SDH-temporal bone fracture after fall. Status post right frontotemporoparietal craniectomy 01/14/2017 with cranioplasty 03/19/2017 -continues to make nice gains.             -continued keeping a routine/schedule.            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  2. Seizure prophylaxis. Off keppra 3. Diabetes mellitus peripheral neuropathy.  4. Hypertension. bp controlled off meds 5. Fatigue:  -suggested use of caffeine in the morning and mid day to help with arousal  -will order thyroid, testosterone studies also  15 minutes of face to face patient care time were spent during this visit. All questions were encouraged and answered. Follow up in 6 months.

## 2018-03-23 LAB — TSH: TSH: 1.17 u[IU]/mL (ref 0.450–4.500)

## 2018-03-23 LAB — T4: T4, Total: 7.3 ug/dL (ref 4.5–12.0)

## 2018-03-23 LAB — TESTOSTERONE, FREE: Testosterone, Free: 5.4 pg/mL — ABNORMAL LOW (ref 7.2–24.0)

## 2018-03-23 LAB — TESTOSTERONE: Testosterone: 143 ng/dL — ABNORMAL LOW (ref 264–916)

## 2018-03-23 LAB — T4, FREE: Free T4: 1.28 ng/dL (ref 0.82–1.77)

## 2018-03-25 MED FILL — ?METFORMIN HCL 1,000 MG TAB: 1000 | 30 days supply | Qty: 60 | Fill #0

## 2018-03-30 ENCOUNTER — Telehealth: Payer: Self-pay | Admitting: Physical Medicine & Rehabilitation

## 2018-03-30 DIAGNOSIS — R7989 Other specified abnormal findings of blood chemistry: Secondary | ICD-10-CM

## 2018-03-30 NOTE — Telephone Encounter (Signed)
I reviewed his recent blood work. Free and total testosterone levels are low. Recommend supplementation.  q 2 weeks. Do they have any resources to fill an rx?

## 2018-04-01 MED ORDER — TESTOSTERONE CYPIONATE 200 MG/ML IM SOLN: 100.0000 mg | INTRAMUSCULAR | 5 refills | Status: DC

## 2018-04-01 NOTE — Telephone Encounter (Signed)
rx sent to pharmacy

## 2018-04-01 NOTE — Telephone Encounter (Signed)
Spoke to patients family, they are [facility] for [facility]....if they can afford it they will pick it up, if not they will contact us back.......Marland Kitchen

## 2018-04-26 MED FILL — ?METFORMIN HCL 1,000 MG TAB: 1000 | 30 days supply | Qty: 60 | Fill #6

## 2018-05-02 IMAGING — CT CT CERVICAL SPINE W/O CM
4 of 8 series · 11 of 33 positions shown, 12 images · non-contrast
Comparison: None.

CLINICAL DATA: 50-year-old male with fall and altered mental
status. Initial encounter.

EXAM:
CT HEAD WITHOUT CONTRAST
CT CERVICAL SPINE WITHOUT CONTRAST
TECHNIQUE: Multidetector CT imaging of the head and cervical spine was
performed following the standard protocol without intravenous
contrast. Multiplanar CT image reconstructions of the cervical spine
were also generated.

[Series 9: c_spine 2.0 st · axial · 0.26mm/px · z∈[-249,-159]mm · 3 of 91 slices shown, 4 images]
[im 23/91  soft-tissue]
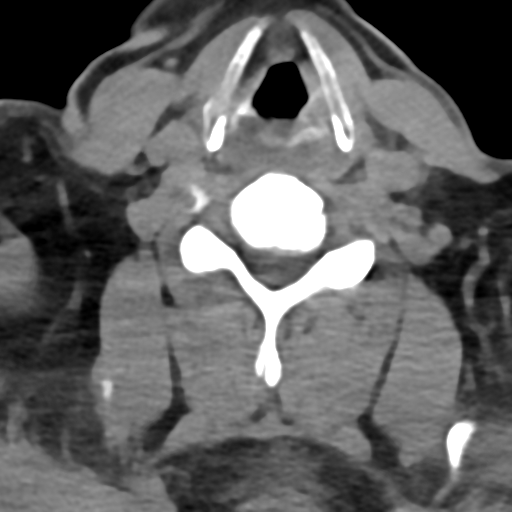
[im 23/91  bone]
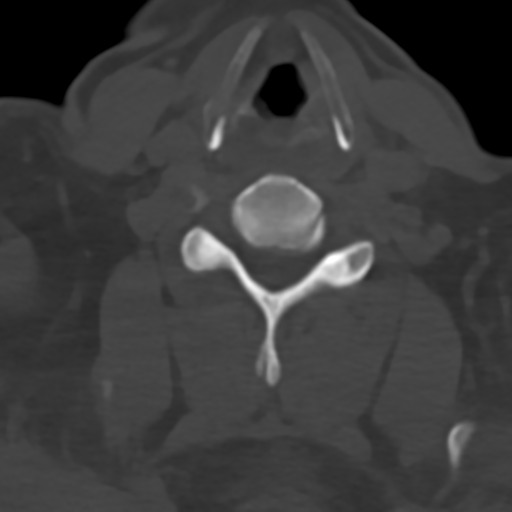
[im 46/91  bone]
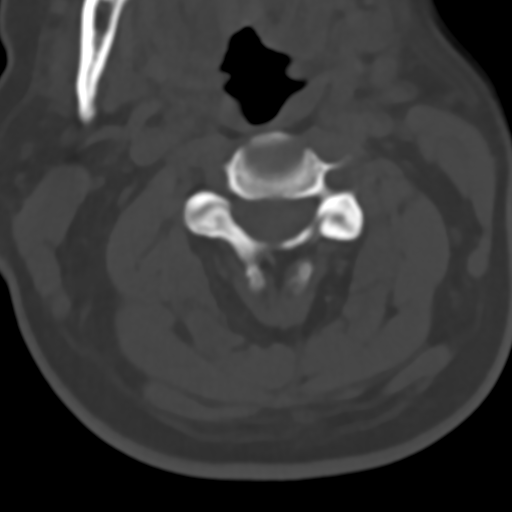
[im 68/91  bone]
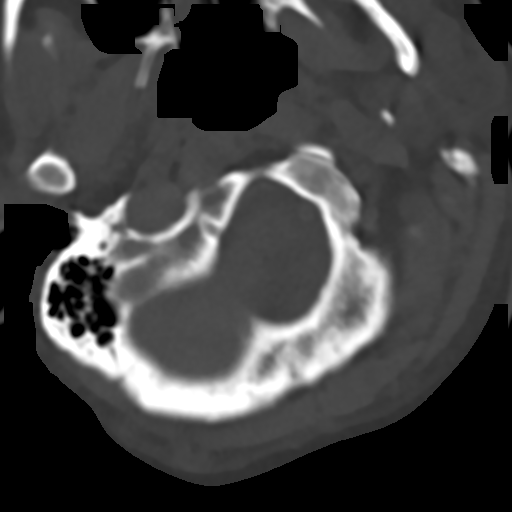

[Series 10: c_spine 2.0 sag bone · sagittal · 0.30mm/px · 4 of 50 slices shown]
[im 10/50  bone]
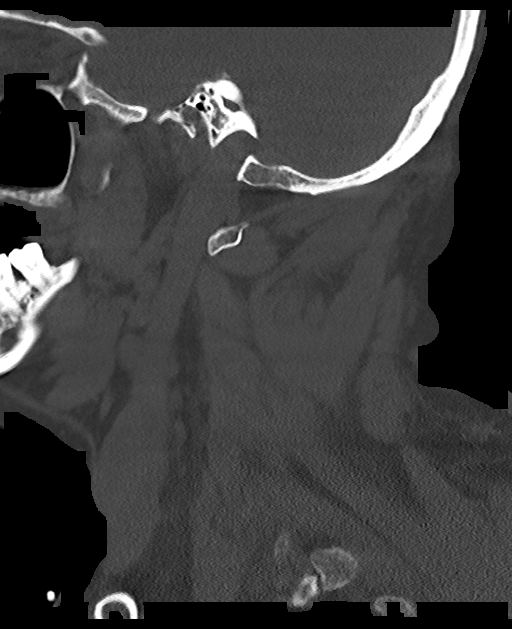
[im 20/50  bone]
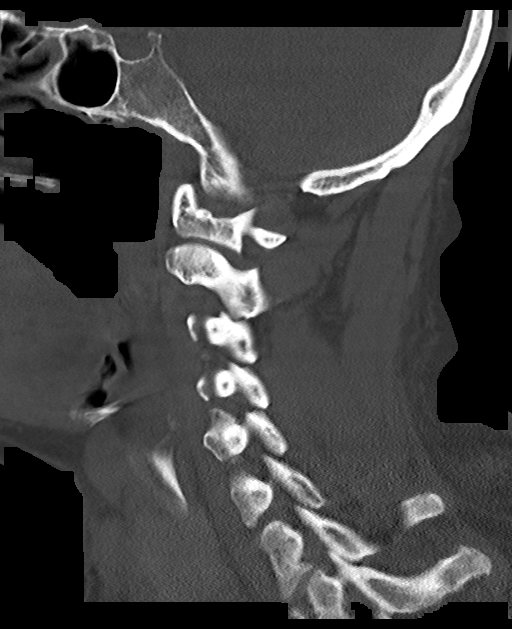
[im 30/50  bone]
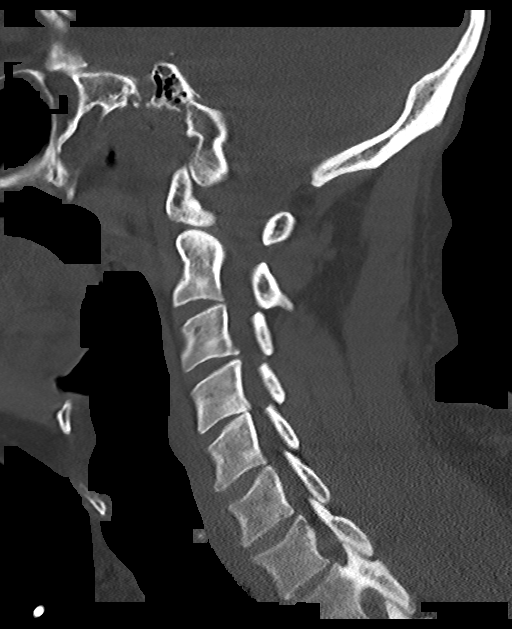
[im 40/50  bone]
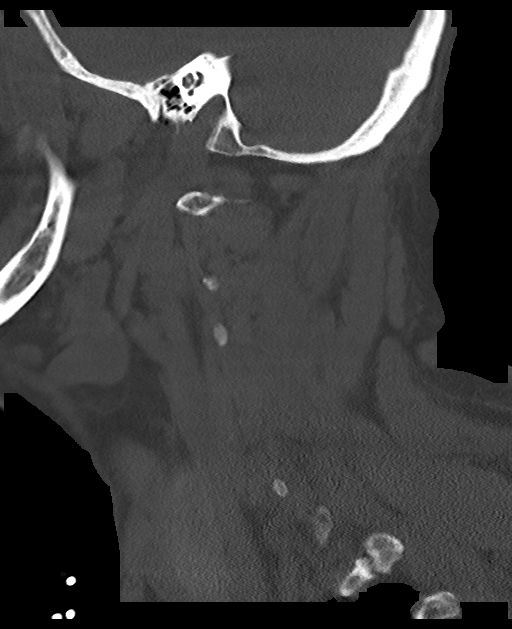

[Series 11: c_spine 2.0 cor bone · coronal · 0.27mm/px · 2 of 61 slices shown]
[im 56/61  bone]
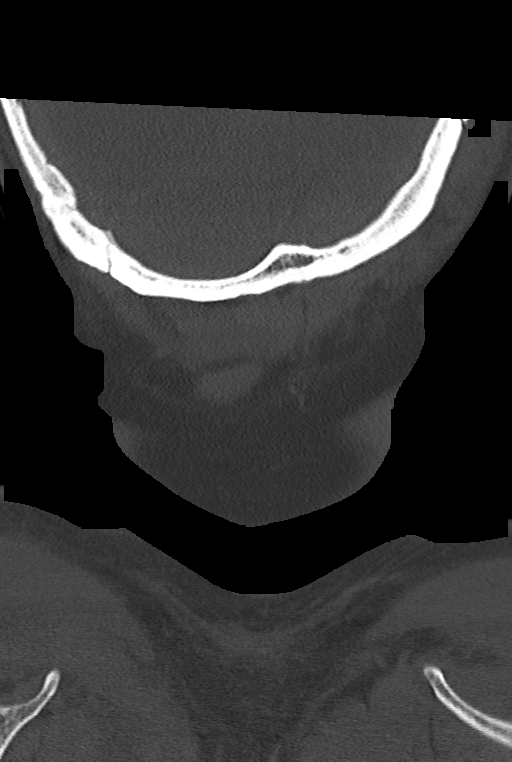
[im 58/61  bone]
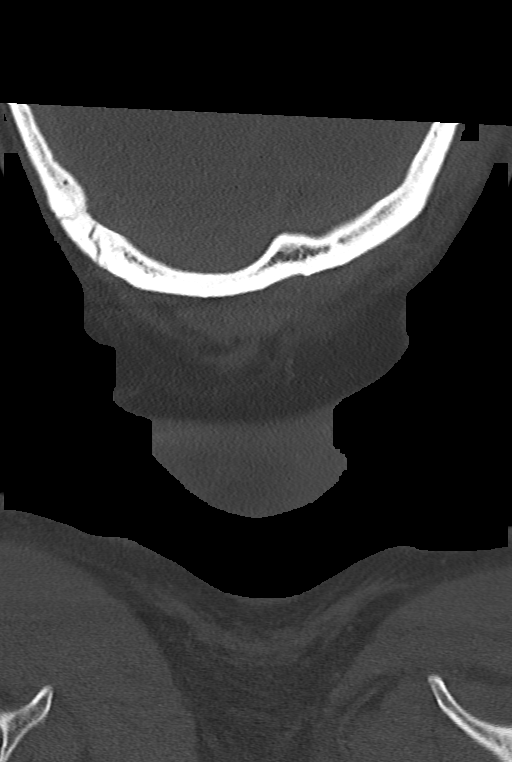

[Series 12: c_spine 2.0 orthogonals · axial · 0.21mm/px · z∈[-265,-216]mm · 2 of 79 slices shown]
[im 27/79  bone]
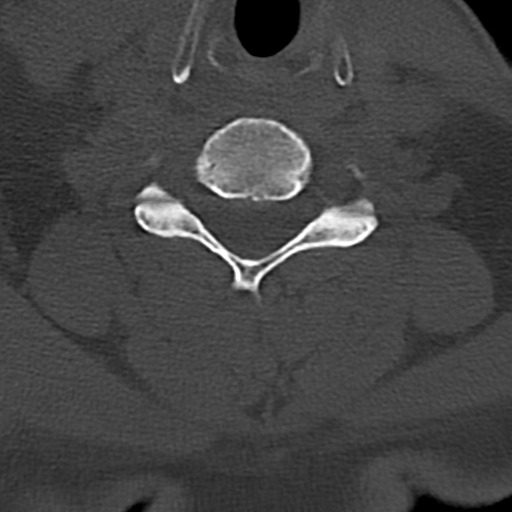
[im 53/79  bone]
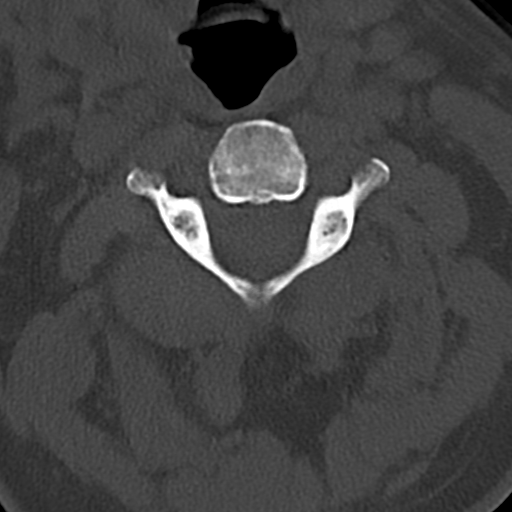

[11 of 33 positions shown; findings below may reference images not displayed]

FINDINGS: CT HEAD FINDINGS

Brain: Scattered areas of intraparenchymal hemorrhage bilaterally
are noted, with the largest area measuring 4.5 cm the right frontal
region. Smaller areas of hemorrhage are noted within the right
temporal, right frontal, left temporal and left parietal lobes.

A right subdural hematoma is noted measuring 6.5 mm in greatest
diameter. Scattered areas of subarachnoid hemorrhage are identified,
greatest in the high right frontal region. There is a 7 mm right to
left midline shift.

A small amount of intraventricular hemorrhage in the left occipital
forearm identified.

Vascular: No hyperdense vessels identified.

Skull: A nondisplaced vertical fracture through the left mastoid air
cells/left temporal bone noted with left mastoid effusion. The left
middle ear is clear.

Sinuses/Orbits: No acute finding.

Other: Left scalp soft tissue swelling is noted.

CT CERVICAL SPINE FINDINGS

Alignment: Normal.

Skull base and vertebrae: No cervical spine fracture identified. No
focal bony lesions present.

Soft tissues and spinal canal: No prevertebral fluid or swelling. No
visible canal hematoma.

Disc levels: Mild degenerative disc disease and spondylosis at C4-5
noted.

Upper chest: Negative.

Other: A fracture through the left mastoid/temporal bone noted with
left mastoid effusion.
IMPRESSION: Intracranial hemorrhage with 4.5 cm right frontal intraparenchymal
hemorrhage, 6 mm right subdural hematoma and other scattered areas
of intraparenchymal and subarachnoid hemorrhage as described. 7 mm
right to left midline shift.

Nondisplaced vertical fracture through the left temporal bone.
Consider high-resolution CT as clinically indicated.

No static evidence of acute injury to the cervical spine.

Critical Value/emergent results were called by telephone at the time
of interpretation on 01/10/2017 at [DATE] to Dr. NESTOR CLAUDIO BRIGITH ,
who verbally acknowledged these results.

## 2018-05-02 IMAGING — CR DG CHEST 1V PORT
1 series · 1 of 1 positions shown · non-contrast
Comparison: Chest CT dated 10/01/2017

CLINICAL DATA: 50-year-old male with fall and unresponsiveness and
traumatic head injury. Status post intubation

EXAM:
PORTABLE CHEST 1 VIEW

[AP]
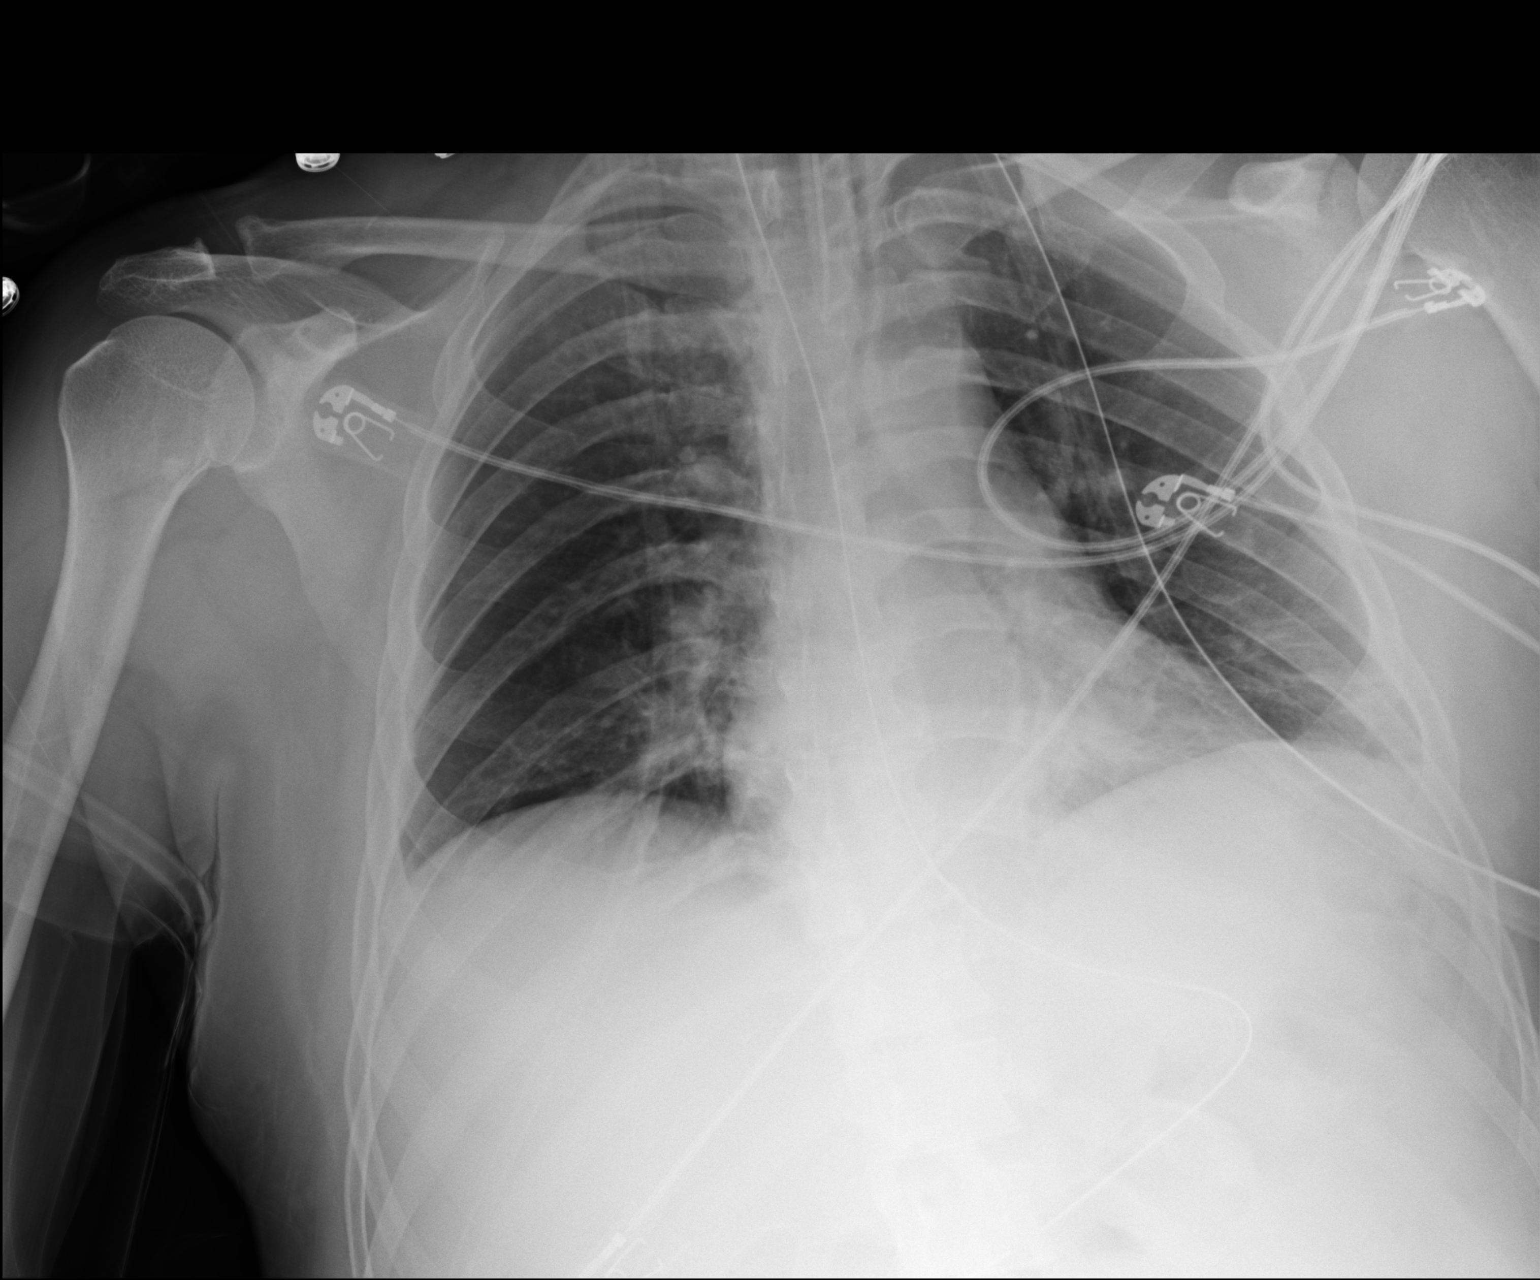

[1 of 1 positions shown; findings below may reference images not displayed]

FINDINGS: An endotracheal tube is noted with tip approximately 4.5 cm above
the carina. And enteric tube extends into the abdomen with side-port
in the epigastric area over the L1 vertebra and the tip beyond the
inferior margin of the image, likely in the distal stomach.
Bibasilar streaky densities more pronounced on the left as seen on
the prior CT may represent atelectasis/infiltrate. No focal
consolidation, pleural effusion, or pneumothorax. The cardiac
silhouette is within normal limits. No acute osseous pathology.
IMPRESSION: 1. Endotracheal tube above the carina and enteric tube with tip
likely in the mid to distal stomach.
2. Bibasilar linear densities, likely atelectasis versus infiltrate.

## 2018-05-02 IMAGING — CT CT MAXILLOFACIAL W/O CM
3 of 6 series · 14 of 47 positions shown, 17 images · non-contrast
Comparison: Head and cervical spine CT 9994 hours today

CLINICAL DATA: 50-year-old male status post fall with altered
mental status. Hemorrhagic cerebral contusions with subarachnoid
hemorrhage and subdural hematoma on the head CT earlier today.

EXAM:
CT MAXILLOFACIAL WITHOUT CONTRAST
TECHNIQUE: Multidetector CT imaging of the maxillofacial structures was
performed. Multiplanar CT image reconstructions were also generated.
A small metallic BB was placed on the right temple in order to
reliably differentiate right from left.

[Series 4: sinus 2.0 h30s · axial · 0.37mm/px · z∈[-621,-491]mm · 9 of 81 slices shown, 12 images]
[im 8/81  brain]
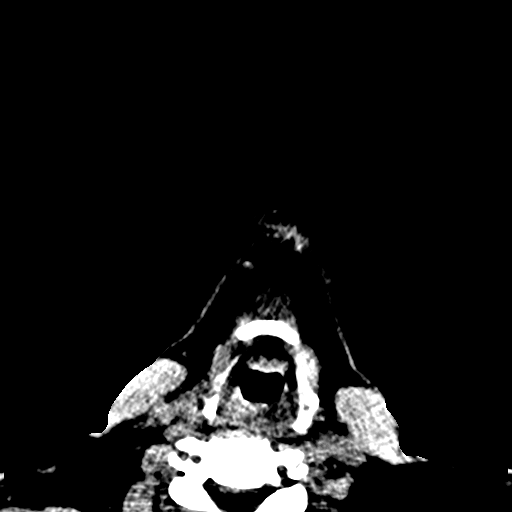
[im 8/81  bone]
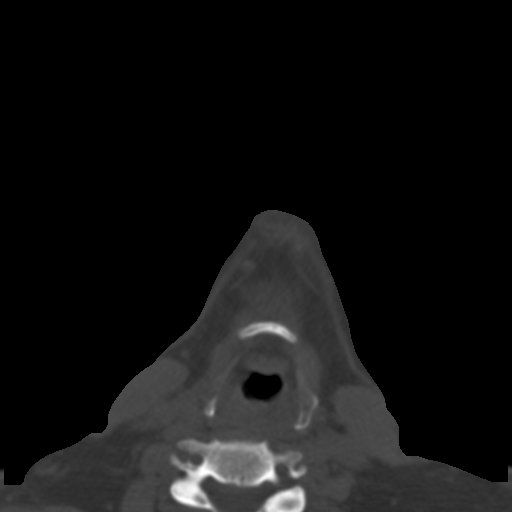
[im 16/81  bone]
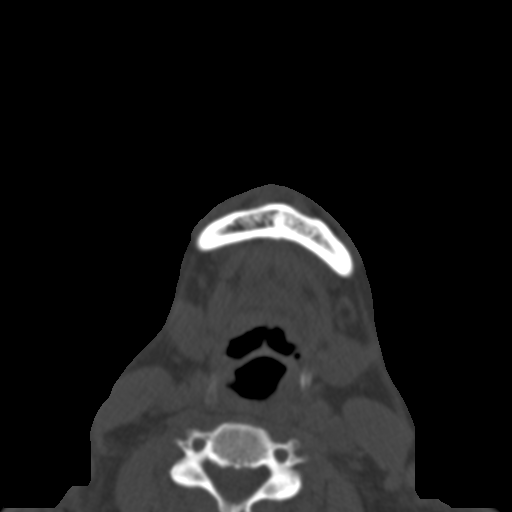
[im 23/81  bone]
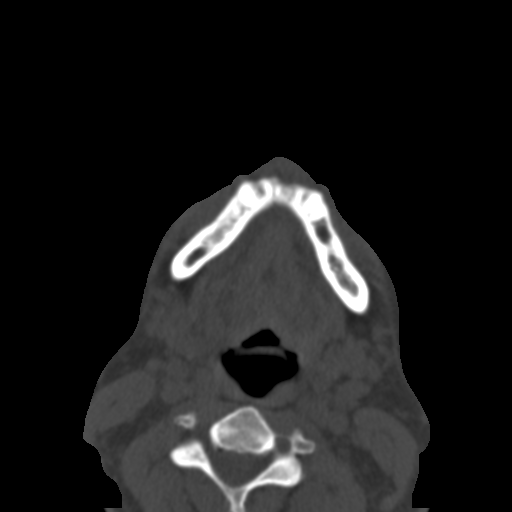
[im 31/81  bone]
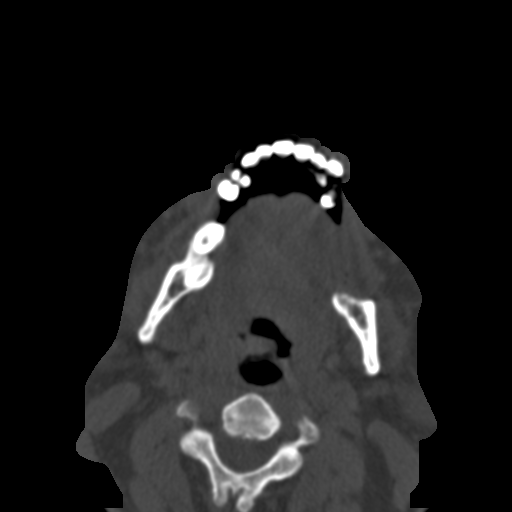
[im 42/81  brain]
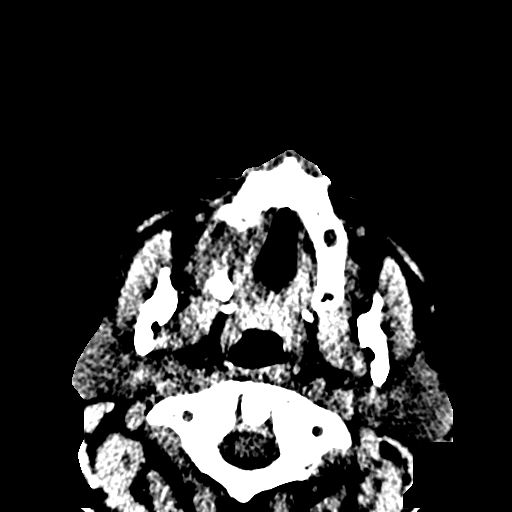
[im 42/81  bone]
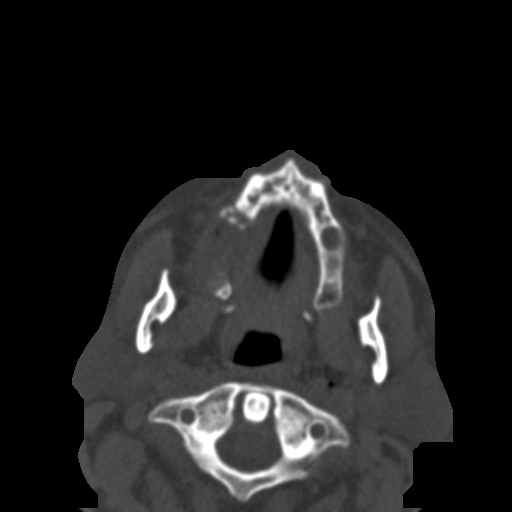
[im 50/81  bone]
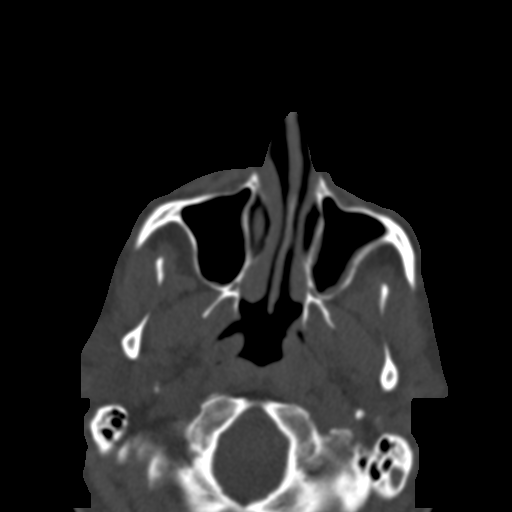
[im 58/81  bone]
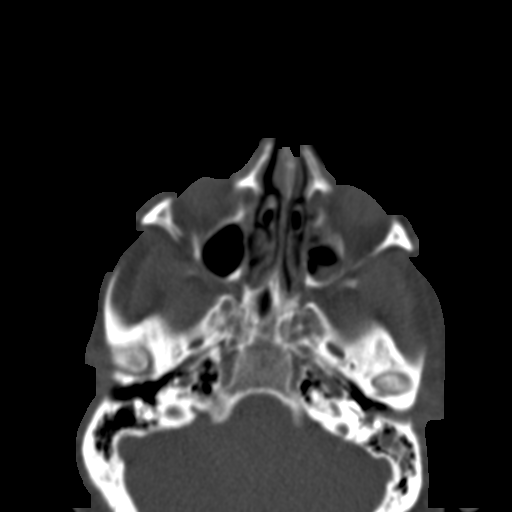
[im 65/81  bone]
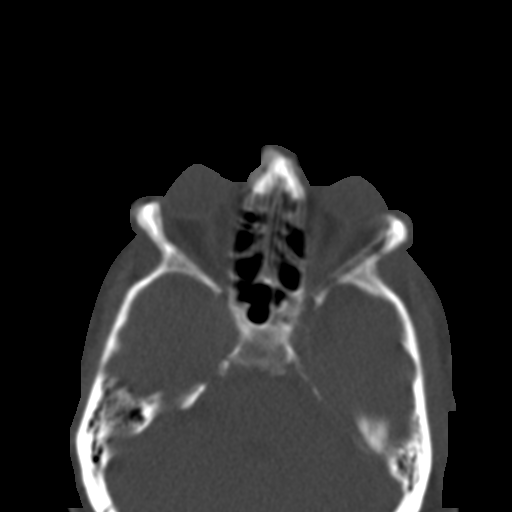
[im 73/81  brain]
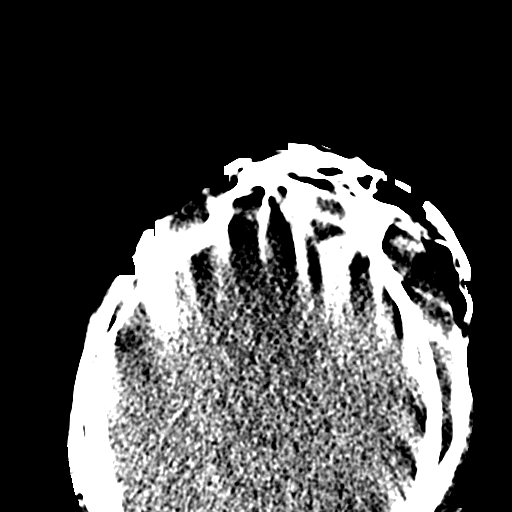
[im 73/81  bone]
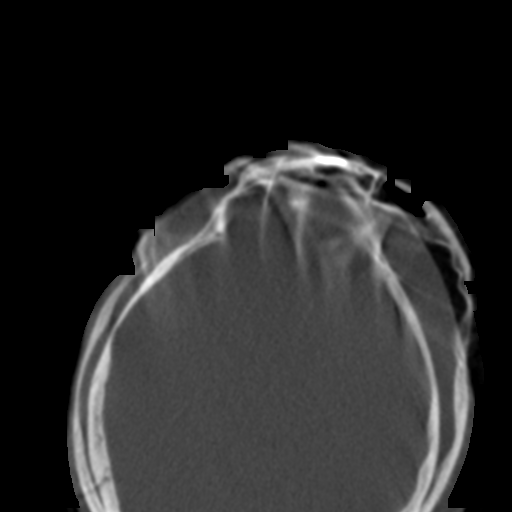

[Series 7: coronal st · coronal · 0.35mm/px · 3 of 74 slices shown]
[im 19/74  bone]
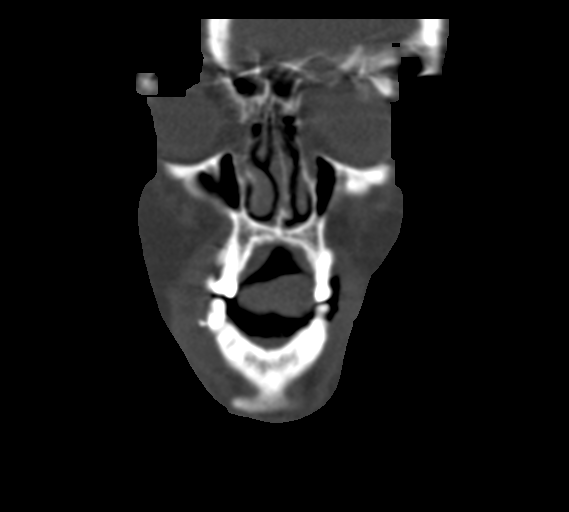
[im 37/74  bone]
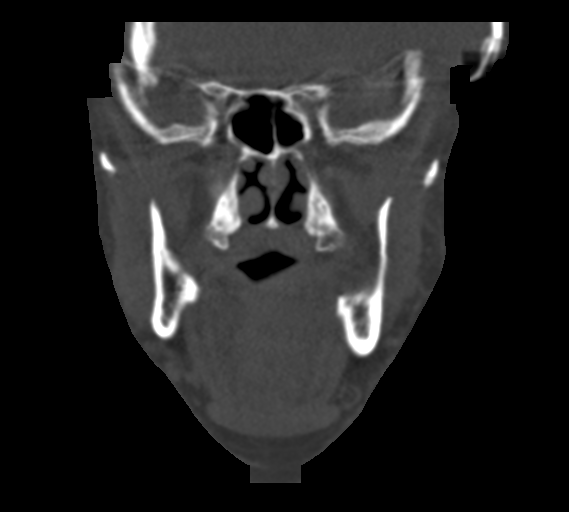
[im 55/74  bone]
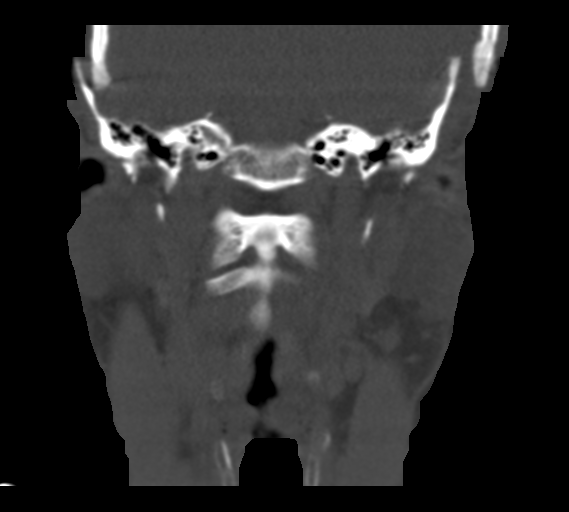

[Series 8: sagittal st · sagittal · 0.35mm/px · 2 of 82 slices shown]
[im 28/82  bone]
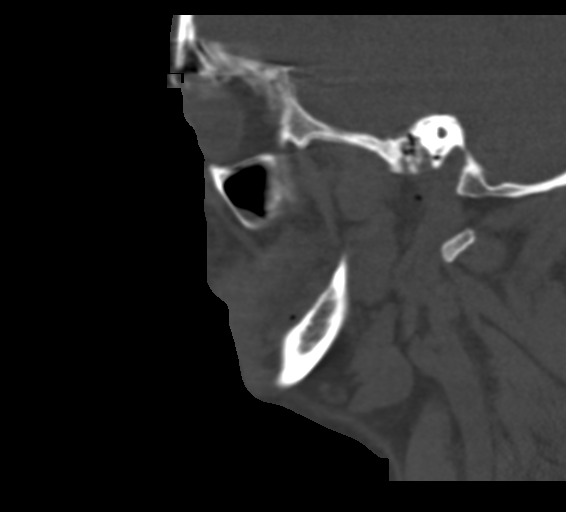
[im 55/82  bone]
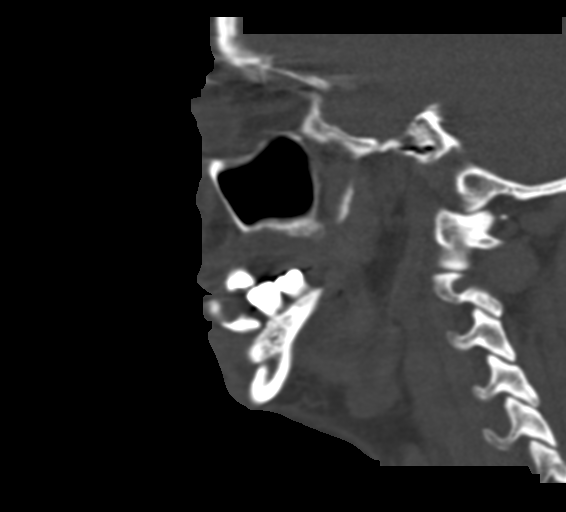

[14 of 47 positions shown; findings below may reference images not displayed]

FINDINGS: Motion artifact on the initial axial images.

Osseous: Mandible intact. Poor dentition right mandible bicuspid. No
maxilla fracture. No zygoma fracture. No definite acute nasal bone
fracture. Pterygoids are intact.

The central skullbase appears intact.

There is a nondepressed horizontally oriented left lateral skull
fracture through the squamousal portion of the temporal bone best
seen on coronal series 13, image 47. On sagittal images (series 14,
image 73) there appears to be a second parallel nondisplaced
fracture which tracks from the the lateral left temporal bone
anteriorly to the greater wing of the sphenoid (series 14, image 62)
and perhaps into the lateral wall of the left orbit (image 58). This
orbital component also appears to beyond series 11, image 13. There
is no associated orbital soft tissue abnormality.

There is a superimposed nondisplaced vertically-oriented fracture
through the left mastoid air cells. There is fluid/blood throughout
the mastoids and also in the left inferior temp panic cavity (hypo
10 planum) and throughout the left EAC. The left ossicles appear to
remain normally aligned.

Orbits: Aside from the suggestion of a nondisplaced fracture of the
left skull which travels into the lateral wall of the left orbit
(detailed above), the orbital walls appear intact. Bilateral orbits
soft tissues appear normal.

Sinuses: Left mastoids and middle ear described above. Mild
paranasal sinus mucosal thickening. Right tympanic cavity and
mastoids are clear.

Soft tissues: Negative visualized noncontrast larynx, pharynx
(retained secretions in the hypopharynx), parapharyngeal spaces,
retropharyngeal space, sublingual space, submandibular glands and
parotid glands. The masticator spaces appear normal.

Limited intracranial: Partially visible extensive right frontal and
temporal hemorrhagic contusions plus right side subdural hematoma.
Smaller posterior left temporal lobe hemorrhagic contusion also re-
demonstrated.
IMPRESSION: 1. Parallel nondisplaced horizontally oriented skull fractures along
the left lateral convexity (sagittal series 14, image 73) tracking
anteriorly to the greater wing of the left sphenoid. There may be
extension into the left lateral wall of the orbit, but the orbits
otherwise appear normal.
2. Vertically-oriented fracture through the left mastoid bone and
air cells with fluid/blood in the left middle ear, mastoids, and
EAC. The left ossicles appear intact.
3. No facial fracture identified.
4. Partially visible extensive intracranial hemorrhage as seen on
the head CT today.

## 2018-05-10 ENCOUNTER — Encounter (HOSPITAL_COMMUNITY): Payer: Self-pay | Admitting: *Deleted

## 2018-05-10 ENCOUNTER — Other Ambulatory Visit: Payer: Self-pay

## 2018-05-10 ENCOUNTER — Emergency Department (HOSPITAL_COMMUNITY): Payer: Self-pay

## 2018-05-10 ENCOUNTER — Emergency Department (HOSPITAL_COMMUNITY)
Admission: EM | Admit: 2018-05-10 | Discharge: 2018-05-10 | Disposition: A | Discharge status: Home / Self Care | Payer: Self-pay | Attending: Emergency Medicine | Admitting: Emergency Medicine

## 2018-05-10 DIAGNOSIS — I10 Essential (primary) hypertension: Secondary | ICD-10-CM | POA: Insufficient documentation

## 2018-05-10 DIAGNOSIS — E119 Type 2 diabetes mellitus without complications: Secondary | ICD-10-CM | POA: Insufficient documentation

## 2018-05-10 DIAGNOSIS — R569 Unspecified convulsions: Secondary | ICD-10-CM | POA: Insufficient documentation

## 2018-05-10 DIAGNOSIS — Z79899 Other long term (current) drug therapy: Secondary | ICD-10-CM | POA: Insufficient documentation

## 2018-05-10 DIAGNOSIS — Z87820 Personal history of traumatic brain injury: Secondary | ICD-10-CM | POA: Insufficient documentation

## 2018-05-10 DIAGNOSIS — Z7984 Long term (current) use of oral hypoglycemic drugs: Secondary | ICD-10-CM | POA: Insufficient documentation

## 2018-05-10 HISTORY — DX: Unspecified convulsions: R56.9

## 2018-05-10 HISTORY — DX: Unspecified intracranial injury with loss of consciousness of unspecified duration, initial encounter: S06.9X9A

## 2018-05-10 LAB — BASIC METABOLIC PANEL
Anion gap: 9 (ref 5–15)
BUN: 14 mg/dL (ref 6–20)
CALCIUM: 8.7 mg/dL — AB (ref 8.9–10.3)
CO2: 26 mmol/L (ref 22–32)
CREATININE: 0.91 mg/dL (ref 0.61–1.24)
Chloride: 103 mmol/L (ref 98–111)
GFR calc Af Amer: >60 mL/min (ref >60)
GFR calc non Af Amer: >60 mL/min (ref >60)
GLUCOSE: 154 mg/dL — AB (ref 70–99)
POTASSIUM: 3.8 mmol/L (ref 3.5–5.1)
Sodium: 138 mmol/L (ref 135–145)

## 2018-05-10 LAB — URINALYSIS, ROUTINE W REFLEX MICROSCOPIC
BACTERIA UA: NONE SEEN
Bilirubin Urine: NEGATIVE
GLUCOSE, UA: NEGATIVE mg/dL
HGB URINE DIPSTICK: NEGATIVE
Ketones, ur: NEGATIVE mg/dL
Leukocytes, UA: NEGATIVE
Nitrite: NEGATIVE
PH: 6 (ref 5.0–8.0)
Protein, ur: 30 mg/dL — AB
SPECIFIC GRAVITY, URINE: 1.015 (ref 1.005–1.030)

## 2018-05-10 LAB — CBC WITH DIFFERENTIAL/PLATELET
Abs Immature Granulocytes: 0 10*3/uL (ref 0.0–0.1)
BASOS PCT: 0 %
Basophils Absolute: 0 10*3/uL (ref 0.0–0.1)
EOS ABS: 0.1 10*3/uL (ref 0.0–0.7)
EOS PCT: 1 %
HEMATOCRIT: 34 % — AB (ref 39.0–52.0)
Hemoglobin: 12.1 g/dL — ABNORMAL LOW (ref 13.0–17.0)
Immature Granulocytes: 0 %
LYMPHS ABS: 1.1 10*3/uL (ref 0.7–4.0)
Lymphocytes Relative: 19 %
MCH: 30.6 pg (ref 26.0–34.0)
MCHC: 35.6 g/dL (ref 30.0–36.0)
MCV: 85.9 fL (ref 78.0–100.0)
MONOS PCT: 5 %
Monocytes Absolute: 0.3 10*3/uL (ref 0.1–1.0)
NEUTROS PCT: 75 %
Neutro Abs: 4 10*3/uL (ref 1.7–7.7)
PLATELETS: 206 10*3/uL (ref 150–400)
RBC: 3.96 MIL/uL — ABNORMAL LOW (ref 4.22–5.81)
RDW: 12 % (ref 11.5–15.5)
WBC: 5.5 10*3/uL (ref 4.0–10.5)

## 2018-05-10 LAB — CBG MONITORING, ED: Glucose-Capillary: 158 mg/dL — ABNORMAL HIGH (ref 70–99)

## 2018-05-10 MED ORDER — LEVETIRACETAM 500 MG PO TABS: 500.0000 mg | ORAL_TABLET | Freq: Two times a day (BID) | ORAL | 0 refills | Status: DC

## 2018-05-10 MED ORDER — SODIUM CHLORIDE 0.9 % IV SOLN
1200.0000 mg | Freq: Once | INTRAVENOUS | Status: AC
  Administered 2018-05-10: 1200 mg via INTRAVENOUS
  Filled 2018-05-10: qty 12

## 2018-05-10 NOTE — ED Notes (Signed)
cbg was 158 

## 2018-05-10 NOTE — ED Notes (Addendum)
Reason for delay explained to pt's sister.  Verbalizes understanding

## 2018-05-10 NOTE — Discharge Instructions (Addendum)
You were seen here today for seizures.  Your blood work, urinalysis, head CT, chest x-ray were all reassuring. I discussed your case with the neurologist who recommended restarting your Keppra.  You were loaded on Keppra in the department.  Please take 500mg  of Keppra twice daily.  Do not drive for the next 6 months.  Follow up with neurology on an outpatient basis.  If you develop worsening or new concerning symptoms you can return to the emergency department for re-evaluation.

## 2018-05-10 NOTE — ED Notes (Signed)
Family at bedside. 

## 2018-05-10 NOTE — ED Provider Notes (Signed)
[facility] EMERGENCY DEPARTMENT Provider Note   CSN: [id number] Arrival date & time: 05/10/18  1818     History   Chief Complaint Chief Complaint  Patient presents with  . Seizures    HPI [name] is a 52 y.o. male with a history of traumatic brain injury (prior intracranial bleed on 01/10/2017 with craniotomy), prior seizures, hypertension, diabetes who presents emergency department today for seizures.  Patient sister is his primary caregiver and helps provide history.  History is also translated by family members.  Sister reports that today around 3 PM patient was sitting in a chair when he had a seizure.  She describes this as the patient convulsing with bilateral upper extremities and a jerking/shaking motion, turning his head and eyes to the left.  She states this lasted for approximately 1 minute.  Patient was very weak and slow to respond for 1-2 minutes after the event.  She states this is similar to seizures he has had in the past.  Patient was previously on [place] after his craniotomy but has been off of it for approximately 1 year.  She denies any seizures over the last 1 year.  He does not currently follow with a neurologist.  The symptoms have not recurred.  He is currently asymptomatic.  Patient does report he has had some increased urinary frequency as well as some pain with urination over the last 1 week prior to the onset of symptoms.  They deny any falls, head trauma or episodes of loss of consciousness.  They note the patient is fairly active, going to the gym daily.  They deny any chest pain, shortness of breath, cough, abdominal pain, flank pain, hematuria, emesis, diarrhea, headache, visual changes, vertigo, new weakness associate with this.  They note that he does have chronic weakness of his left upper extremity after the TBI.  No new medications.  They state patient has been improving and is at his baseline.  HPI  Past Medical History:    Diagnosis Date  . Diabetes mellitus without complication ([place])   . Seizures ([place])   . TBI (traumatic brain injury) ([place]) 03/2017    Patient Active Problem List   Diagnosis Date Noted  . Traumatic brain injury with loss of consciousness ([place]) 10/29/2017  . Anemia 10/29/2017  . Positive depression screening 10/29/2017  . Attention and concentration deficit   . Seizure prophylaxis   . Type 2 diabetes mellitus with peripheral neuropathy (HCC)   . S/P craniotomy 03/19/2017  . Essential hypertension   . Dysphagia, oropharyngeal 02/01/2017  . Diabetes type 2, uncontrolled ([place]) 06/13/2015    Past Surgical History:  Procedure Laterality Date  . CRANIOTOMY Right 01/13/2017   Procedure: 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, Placement of bone flap in abdomen;  Surgeon: Ashok Pall, MD;  Location: [facility];  Service: Neurosurgery;  Laterality: Right;  . CRANIOTOMY Right 03/19/2017   Procedure: Cranioplasty with placement of bone flap from abdomen;  Surgeon: Eustace Moore, MD;  Location: [facility];  Service: Neurosurgery;  Laterality: Right;        Home Medications    Prior to Admission medications   Medication Sig Start Date End Date Taking? Authorizing Provider  Blood Glucose Monitoring Suppl (TRUE METRIX METER) w/Device KIT Use as directed 04/19/17   Ladell Pier, MD  glucose blood (TRUE METRIX BLOOD GLUCOSE TEST) test strip Use as instructed 04/19/17   Ladell Pier, MD  metFORMIN (GLUCOPHAGE) 1000 MG tablet  Take 1 tablet (1,000 mg total) by mouth 2 (two) times daily with a meal. 03/04/18   Ladell Pier, MD  testosterone cypionate (DEPOTESTOSTERONE CYPIONATE) 200 MG/ML injection Inject 0.5 mLs (100 mg total) into the muscle every 14 (fourteen) days. 04/01/18   Meredith Staggers, MD  TRUEPLUS LANCETS 28G MISC Use as directed 04/19/17   Ladell Pier, MD    Family History Family History  Problem Relation Age of Onset  . Diabetes Mother   .  Diabetes Father     Social History Social History   Tobacco Use  . Smoking status: Never Smoker  . Smokeless tobacco: Never Used  Substance Use Topics  . Alcohol use: Yes    Comment: socially, not since accident  . Drug use: No     Allergies   No known allergies and Latex   Review of Systems Review of Systems  All other systems reviewed and are negative.    Physical Exam Updated Vital Signs BP (!) 152/78   Pulse 80   Temp 99 F (37.2 C) (Oral)   Resp 18   SpO2 98%   Physical Exam  Constitutional: He is oriented to person, place, and time. He appears well-developed and well-nourished.  HENT:  Head: Normocephalic and atraumatic.  Right Ear: External ear normal.  Left Ear: External ear normal.  Nose: Nose normal.  Mouth/Throat: Uvula is midline, oropharynx is clear and moist and mucous membranes are normal. No tonsillar exudate.  Eyes: Pupils are equal, round, and reactive to light. Right eye exhibits no discharge. Left eye exhibits no discharge. No scleral icterus.  Neck: Trachea normal. Neck supple. No spinous process tenderness present. No neck rigidity. Normal range of motion present.  Cardiovascular: Normal rate, regular rhythm and intact distal pulses.  No murmur heard. Pulses:      Radial pulses are 2+ on the right side, and 2+ on the left side.       Dorsalis pedis pulses are 2+ on the right side, and 2+ on the left side.       Posterior tibial pulses are 2+ on the right side, and 2+ on the left side.  No lower extremity swelling or edema. Calves symmetric in size bilaterally.  Pulmonary/Chest: Effort normal and breath sounds normal. He exhibits no tenderness.  Abdominal: Soft. Bowel sounds are normal. There is no tenderness. There is no rebound and no guarding.  Musculoskeletal: He exhibits no edema.  Lymphadenopathy:    He has no cervical adenopathy.  Neurological: He is alert and oriented to person, place, and time. No sensory deficit. GCS eye subscore  is 4. GCS verbal subscore is 5. GCS motor subscore is 6.  Skin: Skin is warm and dry. No rash noted. He is not diaphoretic.  Psychiatric: He has a normal mood and affect.  Nursing note and vitals reviewed.    ED Treatments / Results  Labs (all labs ordered are listed, but only abnormal results are displayed) Labs Reviewed  BASIC METABOLIC PANEL - Abnormal; Notable for the following components:      Result Value   Glucose, Bld 154 (*)    Calcium 8.7 (*)    All other components within normal limits  CBC WITH DIFFERENTIAL/PLATELET - Abnormal; Notable for the following components:   RBC 3.96 (*)    Hemoglobin 12.1 (*)    HCT 34.0 (*)    All other components within normal limits  URINALYSIS, ROUTINE W REFLEX MICROSCOPIC - Abnormal; Notable for the following components:  Protein, ur 30 (*)    All other components within normal limits  CBG MONITORING, ED - Abnormal; Notable for the following components:   Glucose-Capillary 158 (*)    All other components within normal limits    EKG None  Radiology Dg Chest 2 View  Result Date: 05/10/2018 CLINICAL DATA:  Seizure today EXAM: CHEST - 2 VIEW COMPARISON:  04/02/2017 FINDINGS: Artifact from EKG leads. Normal heart size and mediastinal contours. There is no edema, consolidation, effusion, or pneumothorax. IMPRESSION: No evidence of active disease. Electronically Signed   By: Monte Fantasia M.D.   On: 05/10/2018 19:34   Ct Head Wo Contrast  Result Date: 05/10/2018 CLINICAL DATA:  Traumatic brain injury 1 year ago.  Seizure today EXAM: CT HEAD WITHOUT CONTRAST TECHNIQUE: Contiguous axial images were obtained from the base of the skull through the vertex without intravenous contrast. COMPARISON:  04/11/2017 FINDINGS: Brain: Dense encephalomalacia in the superficial right frontal and temporal lobes which involves cortex. Encephalomalacia also seen in the superficial left temporal lobe. There is cerebral volume loss with ventriculomegaly. No  evidence of infarct, hemorrhage, hydrocephalus, or collection. Vascular: Negative Skull: Prior craniotomy on the right. Sinuses/Orbits: Negative Other: Persistent mild motion degradation IMPRESSION: 1. No acute finding. 2. History of TBI with right more than left encephalomalacia involving cortex. Atrophy. Electronically Signed   By: Monte Fantasia M.D.   On: 05/10/2018 19:48    Procedures Procedures (including critical care time)  Medications Ordered in ED Medications  levETIRAcetam (KEPPRA) 1,200 mg in sodium chloride 0.9 % 100 mL IVPB (0 mg Intravenous Stopped 05/10/18 2253)     Initial Impression / Assessment and Plan / ED Course  I have reviewed the triage vital signs and the nursing notes.  Pertinent labs & imaging results that were available during my care of the patient were reviewed by me and considered in my medical decision making (see chart for details).     52 y.o. male with history of TBI status post craniotomy who has a history of seizures but has been off Keppra for greater than 1 year without seizures presents for seizure.  Caregiver reports seizure lasting approximately 1 minute with associated postictal phase.  No recurrence since onset.  Patient is at his neurologic baseline.  Family reports that he is at his baseline.  Vital signs are reassuring on presentation.  Lab work, chest x-ray, UA, CT head are unremarkable for causes of seizure. Discuss case with Dr. Lorraine Lax of neurology. He recommends loading with keppra and d/c on 565m bid and outpatient. States no driving for 6 months. Recommends outpatient follow up with neurology. The evaluation does not show pathology that would require ongoing emergent intervention or inpatient treatment. I advised the patient to follow-up with Neurology this week. I advised the patient to return to the emergency department with new or worsening symptoms or new concerns. Specific return precautions discussed. The patient verbalized understanding  and agreement with plan. All questions answered. No further questions at this time. The patient is hemodynamically stable, mentating appropriately and appears safe for discharge.  Patient case discussed with Dr. LRex Kraswho is in agreement with plan.  Final Clinical Impressions(s) / ED Diagnoses   Final diagnoses:  Seizure ([facility]  History of traumatic brain injury    ED Discharge Orders        Ordered    levETIRAcetam (KEPPRA) 500 MG tablet  2 times daily     05/10/18 2258       Maczis, MBarth Kirks  PA-C 05/11/18 0106    Little, Wenda Overland, MD 05/11/18 1249

## 2018-05-10 NOTE — ED Notes (Signed)
Pt's sister who is the pt's care giver reports they were at [employer] today when the pt had a seizure episode while sitting on a bench.  She states the seizure lasted more than a minute.  Afterwards, pt went to the BR to urinate but was not able to.  Pt has hx of TBI.  Per his sister, pt is alert and oriented per his norm. The only change is he has been sleeping more than usual.  Pt reports urinary frequency but denies dysuria.  He denies any cp, SOB or dizziness.  He has mild left sided weakness which is not new.

## 2018-05-11 ENCOUNTER — Telehealth: Payer: Self-pay | Admitting: Internal Medicine

## 2018-05-11 DIAGNOSIS — G40409 Other generalized epilepsy and epileptic syndromes, not intractable, without status epilepticus: Secondary | ICD-10-CM

## 2018-05-11 MED FILL — levETIRAcetam 500 MG TABS: 500 | 30 days supply | Qty: 60 | Fill #0

## 2018-05-11 NOTE — Telephone Encounter (Signed)
Contacted pt sister to go over Dr. Laural BenesJohnson response pt sister is aware and doesn't have any questions or concerns

## 2018-05-11 NOTE — Telephone Encounter (Signed)
Patient sister came in requesting an appt with the Neurologist and I called and was told that patient needed a referral. Patient was seen at the ED for Seizures and has scheduled an ED f/u with PCP on 8/15. Please f/u

## 2018-05-12 ENCOUNTER — Encounter: Payer: Self-pay | Admitting: Neurology

## 2018-06-01 ENCOUNTER — Other Ambulatory Visit: Payer: Self-pay

## 2018-06-01 MED FILL — ?METFORMIN HCL 1000MG TABS: 1000 | 30 days supply | Qty: 60 | Fill #7

## 2018-06-02 MED ORDER — LEVETIRACETAM 500 MG PO TABS: 500.0000 mg | ORAL_TABLET | Freq: Two times a day (BID) | ORAL | 4 refills | Status: DC

## 2018-06-16 ENCOUNTER — Ambulatory Visit: Payer: Self-pay | Attending: Internal Medicine | Admitting: Internal Medicine

## 2018-06-16 ENCOUNTER — Encounter: Payer: Self-pay | Admitting: Internal Medicine

## 2018-06-16 VITALS — BP 101/64 | HR 67 | Temp 98.6°F | Resp 16 | Wt 140.8 lb

## 2018-06-16 DIAGNOSIS — G40909 Epilepsy, unspecified, not intractable, without status epilepticus: Secondary | ICD-10-CM | POA: Insufficient documentation

## 2018-06-16 DIAGNOSIS — Z87820 Personal history of traumatic brain injury: Secondary | ICD-10-CM | POA: Insufficient documentation

## 2018-06-16 DIAGNOSIS — Z7984 Long term (current) use of oral hypoglycemic drugs: Secondary | ICD-10-CM | POA: Insufficient documentation

## 2018-06-16 DIAGNOSIS — R7989 Other specified abnormal findings of blood chemistry: Secondary | ICD-10-CM

## 2018-06-16 DIAGNOSIS — I10 Essential (primary) hypertension: Secondary | ICD-10-CM | POA: Insufficient documentation

## 2018-06-16 DIAGNOSIS — E1142 Type 2 diabetes mellitus with diabetic polyneuropathy: Secondary | ICD-10-CM | POA: Insufficient documentation

## 2018-06-16 DIAGNOSIS — Z833 Family history of diabetes mellitus: Secondary | ICD-10-CM | POA: Insufficient documentation

## 2018-06-16 DIAGNOSIS — Z79899 Other long term (current) drug therapy: Secondary | ICD-10-CM | POA: Insufficient documentation

## 2018-06-16 DIAGNOSIS — E119 Type 2 diabetes mellitus without complications: Secondary | ICD-10-CM

## 2018-06-16 LAB — GLUCOSE, POCT (MANUAL RESULT ENTRY): POC Glucose: 105 mg/dl — AB (ref 70–99)

## 2018-06-16 LAB — POCT GLYCOSYLATED HEMOGLOBIN (HGB A1C): Hemoglobin A1C: 5.1 % (ref 4.0–5.6)

## 2018-06-16 MED FILL — levETIRAcetam 500 MG TABS: 500 | 30 days supply | Qty: 60 | Fill #0

## 2018-06-16 NOTE — Progress Notes (Signed)
Patient ID: [name], male    DOB: 1966/09/13  MRN: [id number]  CC: Hospitalization Follow-up (ED f/u)   Subjective: [name] is a 52 y.o. male who presents for chronic ds management. Sisters is with him and provides most of the hx His concerns today include:  Patient is a 52 year old male with history of diabetes type 2,hypertension and craniotomy due tointracranial bleed, cranial fractures and subdural hematomas2018.  Since last visit patient had a tonic-clonic seizure on July 9 while he and the family were at a [employer].  Patient was taken to the emergency room.  CT of the head was negative for any acute changes.  Patient was restarted on Keppra.  He has an appointment coming up with neurology next month.  Since ER visit he has not had any further seizure episodes. -Sister is wanting to get him into some type of day program that will help with mental stimulation. -On last visit we had spoke about having him help another family member with landscaping.  However, the family member had to abandon that idea as patient was having difficult time following commands and got tired easily. -Saw Dr. Tessa Lerner in May and was found to have a low testosterone level.  He is now on testosterone injections twice a month.    DM: Blood sugars have been running 105-140.  Compliant with metformin.  Sister tries to control the amount that he eats.  Without direction patient wants to overeat. Patient Active Problem List   Diagnosis Date Noted  . Traumatic brain injury with loss of consciousness ([place]) 10/29/2017  . Anemia 10/29/2017  . Positive depression screening 10/29/2017  . Attention and concentration deficit   . Seizure prophylaxis   . Type 2 diabetes mellitus with peripheral neuropathy (HCC)   . S/P craniotomy 03/19/2017  . Essential hypertension   . Dysphagia, oropharyngeal 02/01/2017  . Diabetes type 2, uncontrolled ([place]) 06/13/2015     Current  Outpatient Medications on File Prior to Visit  Medication Sig Dispense Refill  . Blood Glucose Monitoring Suppl (TRUE METRIX METER) w/Device KIT Use as directed (Patient not taking: Reported on 05/10/2018) 1 kit 0  . glucose blood (TRUE METRIX BLOOD GLUCOSE TEST) test strip Use as instructed (Patient not taking: Reported on 05/10/2018) 100 each 12  . levETIRAcetam (KEPPRA) 500 MG tablet Take 1 tablet (500 mg total) by mouth 2 (two) times daily. 60 tablet 4  . metFORMIN (GLUCOPHAGE) 1000 MG tablet Take 1 tablet (1,000 mg total) by mouth 2 (two) times daily with a meal. 180 tablet 3  . testosterone cypionate (DEPOTESTOSTERONE CYPIONATE) 200 MG/ML injection Inject 0.5 mLs (100 mg total) into the muscle every 14 (fourteen) days. 10 mL 5  . TRUEPLUS LANCETS 28G MISC Use as directed (Patient not taking: Reported on 05/10/2018) 100 each 6   No current facility-administered medications on file prior to visit.     Allergies  Allergen Reactions  . Latex Itching and Rash    Social History   Socioeconomic History  . Marital status: Single    Spouse name: Not on file  . Number of children: Not on file  . Years of education: Not on file  . Highest education level: Not on file  Occupational History  . Not on file  Social Needs  . Financial resource strain: Not on file  . Food insecurity:    Worry: Not on file    Inability: Not on file  . Transportation needs:  Medical: Not on file    Non-medical: Not on file  Tobacco Use  . Smoking status: Never Smoker  . Smokeless tobacco: Never Used  Substance and Sexual Activity  . Alcohol use: Yes    Comment: socially, not since accident  . Drug use: No  . Sexual activity: Not on file  Lifestyle  . Physical activity:    Days per week: Not on file    Minutes per session: Not on file  . Stress: Not on file  Relationships  . Social connections:    Talks on phone: Not on file    Gets together: Not on file    Attends religious service: Not on file     Active member of club or organization: Not on file    Attends meetings of clubs or organizations: Not on file    Relationship status: Not on file  . Intimate partner violence:    Fear of current or ex partner: Not on file    Emotionally abused: Not on file    Physically abused: Not on file    Forced sexual activity: Not on file  Other Topics Concern  . Not on file  Social History Narrative  . Not on file    Family History  Problem Relation Age of Onset  . Diabetes Mother   . Diabetes Father     Past Surgical History:  Procedure Laterality Date  . CRANIOTOMY Right 01/13/2017   Procedure: 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, Placement of bone flap in abdomen;  Surgeon: Ashok Pall, MD;  Location: [facility];  Service: Neurosurgery;  Laterality: Right;  . CRANIOTOMY Right 03/19/2017   Procedure: Cranioplasty with placement of bone flap from abdomen;  Surgeon: Eustace Moore, MD;  Location: [facility];  Service: Neurosurgery;  Laterality: Right;    ROS: Review of Systems Negative except as stated above PHYSICAL EXAM: BP 101/64   Pulse 67   Temp 98.6 F (37 C) (Oral)   Resp 16   Wt 140 lb 12.8 oz (63.9 kg)   SpO2 98%   BMI 26.60 kg/m   Physical Exam  General appearance -patient alert and in NAD.  He follows commands appropriately about 80% of the times.   Mental status -flat affect Chest - clear to auscultation, no wheezes, rales or rhonchi, symmetric air entry Heart - normal rate, regular rhythm, normal S1, S2, no murmurs, rubs, clicks or gallops Neurological -grip 5/5 BL.  UEs: 5/5 RT, 4+/5 LT.  LEs 5/5 BL. Gross sensation intact Extremities - peripheral pulses normal, no pedal edema, no clubbing or cyanosis  Results for orders placed or performed in visit on 06/16/18  POCT glucose (manual entry)  Result Value Ref Range   POC Glucose 105 (A) 70 - 99 mg/dl  POCT glycosylated hemoglobin (Hb A1C)  Result Value Ref Range   Hemoglobin A1C 5.1 4.0 -  5.6 %   HbA1c POC (<> result, manual entry)     HbA1c, POC (prediabetic range)     HbA1c, POC (controlled diabetic range)      ASSESSMENT AND PLAN: 1. Seizure disorder ([place]) continue Keppra and keep neurology appt Will have case worker touch base with sister about any day programs in the area.  I think opportunities will be a little limited for him in that he does not speak [place].  If he goes to a day program when nobody speaks his language, it may cause feelings of social isolation  - Hepatic Function Panel  2. Controlled type 2 diabetes mellitus without complication, without long-term current use of insulin (HCC) Continue Metformin.  Sister to monitor eating habits to help prevent compulsive eating - POCT glucose (manual entry) - POCT glycosylated hemoglobin (Hb A1C)  3. Low testosterone On testosterone replacement.  Last CBC done 1 mth ago. - PSA   Patient was given the opportunity to ask questions.  Patient verbalized understanding of the plan and was able to repeat key elements of the plan.   Orders Placed This Encounter  Procedures  . Hepatic Function Panel  . PSA  . POCT glucose (manual entry)  . POCT glycosylated hemoglobin (Hb A1C)     Requested Prescriptions    No prescriptions requested or ordered in this encounter    Return in about 3 months (around 09/16/2018).  Karle Plumber, MD, FACP

## 2018-06-17 ENCOUNTER — Telehealth: Payer: Self-pay

## 2018-06-17 LAB — HEPATIC FUNCTION PANEL
ALT: 15 IU/L (ref 0–44)
AST: 11 IU/L (ref 0–40)
Albumin: 4.3 g/dL (ref 3.5–5.5)
Alkaline Phosphatase: 54 IU/L (ref 39–117)
BILIRUBIN TOTAL: 0.5 mg/dL (ref 0.0–1.2)
BILIRUBIN, DIRECT: 0.11 mg/dL (ref 0.00–0.40)
Total Protein: 7 g/dL (ref 6.0–8.5)

## 2018-06-17 LAB — PSA: Prostate Specific Ag, Serum: 0.9 ng/mL (ref 0.0–4.0)

## 2018-06-17 NOTE — Telephone Encounter (Signed)
At request of Dr Laural BenesJohnson, call placed to the patient's sister, Karna Christmasdrianna with the assistance of Spanish interpreter # [phone] with [employer]. Karna Christmasdrianna stated that she could speak / understand English but preferred to have the interpreter on the line. She also said that an interpreter could be used in the future.  She explained that she is looking for a day program for her brother who has been living with her since his accident about 2 years ago.  She said that he has the [employer] and [employer], no income. Explained to her that without medicaid the day program would be an out of pocket expense. She said that she can't afford that. Informed her that this CM would discuss with [facility] to inquire about other options and would call her back next week.

## 2018-06-22 ENCOUNTER — Ambulatory Visit: Payer: Self-pay | Attending: Internal Medicine

## 2018-06-22 MED FILL — ?METFORMIN HCL 1000MG TABS: 1000 | 30 days supply | Qty: 60 | Fill #1

## 2018-06-22 NOTE — Telephone Encounter (Signed)
Met with patient's sister when she was in the clinic today.  Explained to her that this CM spoke to the [facility] and she was not able to identify any day programs that are not private pay.  [name] stated that she is not able to afford any additional expenses. He does not have medicaid at this time but she is willing to re-apply for him again. She explained that he was given emergency medicaid when he was in the hospital but that was it. Since having a recent seizure she is agreeable to applying again for him.  She was teary noting that she is his main support and he is with her 24/7 as she doesn't want to leave him alone. She explained that she has other responsibilities but she is hesitant to leave him alone. She does not feel that he would be able to work at this time and is considering applying for disability for him.

## 2018-06-28 ENCOUNTER — Telehealth: Payer: Self-pay

## 2018-06-28 NOTE — Telephone Encounter (Signed)
Contacted pt emergency contact and made aware of lab results she doesn't have any questions or concerns

## 2018-06-28 NOTE — Telephone Encounter (Signed)
error 

## 2018-07-05 ENCOUNTER — Ambulatory Visit: Payer: Self-pay | Admitting: Internal Medicine

## 2018-07-08 ENCOUNTER — Ambulatory Visit: Payer: Self-pay | Admitting: Internal Medicine

## 2018-07-13 ENCOUNTER — Ambulatory Visit: Payer: Self-pay | Admitting: Neurology

## 2018-07-26 MED FILL — levETIRAcetam 500 MG TABS: 500 | 30 days supply | Qty: 60 | Fill #1

## 2018-07-26 MED FILL — ?METFORMIN HCL 1000MG TABS: 1000 | 30 days supply | Qty: 60 | Fill #2

## 2018-07-28 ENCOUNTER — Ambulatory Visit: Payer: Self-pay | Attending: Family Medicine | Admitting: Family Medicine

## 2018-07-28 ENCOUNTER — Encounter: Payer: Self-pay | Admitting: Family Medicine

## 2018-07-28 VITALS — BP 123/79 | HR 79 | Temp 99.4°F | Resp 17 | Ht 61.0 in | Wt 142.0 lb

## 2018-07-28 DIAGNOSIS — I10 Essential (primary) hypertension: Secondary | ICD-10-CM | POA: Insufficient documentation

## 2018-07-28 DIAGNOSIS — Z91040 Latex allergy status: Secondary | ICD-10-CM | POA: Insufficient documentation

## 2018-07-28 DIAGNOSIS — Z79899 Other long term (current) drug therapy: Secondary | ICD-10-CM | POA: Insufficient documentation

## 2018-07-28 DIAGNOSIS — J069 Acute upper respiratory infection, unspecified: Secondary | ICD-10-CM

## 2018-07-28 DIAGNOSIS — J029 Acute pharyngitis, unspecified: Secondary | ICD-10-CM | POA: Insufficient documentation

## 2018-07-28 DIAGNOSIS — G40909 Epilepsy, unspecified, not intractable, without status epilepticus: Secondary | ICD-10-CM | POA: Insufficient documentation

## 2018-07-28 DIAGNOSIS — Z79890 Hormone replacement therapy (postmenopausal): Secondary | ICD-10-CM | POA: Insufficient documentation

## 2018-07-28 DIAGNOSIS — Z833 Family history of diabetes mellitus: Secondary | ICD-10-CM | POA: Insufficient documentation

## 2018-07-28 DIAGNOSIS — E1142 Type 2 diabetes mellitus with diabetic polyneuropathy: Secondary | ICD-10-CM | POA: Insufficient documentation

## 2018-07-28 DIAGNOSIS — Z7984 Long term (current) use of oral hypoglycemic drugs: Secondary | ICD-10-CM | POA: Insufficient documentation

## 2018-07-28 MED ORDER — CETIRIZINE HCL 10 MG PO TABS: 10.0000 mg | ORAL_TABLET | Freq: Every day | ORAL | 11 refills | Status: DC

## 2018-07-28 MED ORDER — AMOXICILLIN-POT CLAVULANATE 875-125 MG PO TABS: 1.0000 | ORAL_TABLET | Freq: Two times a day (BID) | ORAL | 0 refills | Status: DC

## 2018-07-28 MED ORDER — BENZONATATE 100 MG PO CAPS
100.0000 mg | ORAL_CAPSULE | Freq: Three times a day (TID) | ORAL | 0 refills | Status: DC | PRN
Start: 2018-07-28 — End: 2018-08-30

## 2018-07-28 MED FILL — ?CETIRIZINE HCL 10 MG TABLE: 10 | 30 days supply | Qty: 30 | Fill #0

## 2018-07-28 MED FILL — BENZONATATE 100 MG CAP: 100 | 7 days supply | Qty: 40 | Fill #0

## 2018-07-28 MED FILL — AMOX-CLAV 875-125 MG TABLET: 875-125 | 10 days supply | Qty: 20 | Fill #0

## 2018-07-28 NOTE — Patient Instructions (Addendum)
Infección del tracto respiratorio superior, adultos  (Upper Respiratory Infection, Adult)  La mayoría de las infecciones del tracto respiratorio superior están causadas por un virus. Un infección del tracto respiratorio superior afecta la nariz, la garganta y las vías respiratorias superiores. El tipo más común de infección del tracto respiratorio superior es el resfrío común.  CUIDADOS EN EL HOGAR  · Tome los medicamentos solamente como se lo haya indicado el médico.  · A fin de aliviar el dolor de garganta, haga gárgaras con solución salina templada o consuma caramelos para la tos, como se lo haya indicado el médico.  · Use un humidificador de vapor cálido o inhale el vapor de la ducha para aumentar la humedad del aire. Esto facilitará la respiración.  · Beba suficiente líquido para mantener el pis (orina) claro o de color amarillo pálido.  · Tome sopas y caldos transparentes.  · Siga una dieta saludable.  · Descanse todo lo que sea necesario.  · Regrese al trabajo cuando la fiebre haya desaparecido o el médico le diga que puede hacerlo.  ? Es posible que deba quedarse en su casa durante un tiempo prolongado, para no transmitir la infección a los demás.  ? También puede usar un barbijo y lavarse las manos con frecuencia para evitar el contagio del virus.  · Si tiene asma, use el inhalador con mayor frecuencia.  · No consuma ningún producto que contenga tabaco, lo que incluye cigarrillos, tabaco de mascar o cigarrillos electrónicos. Si necesita ayuda para dejar de fumar, consulte al médico.  SOLICITE AYUDA SI:  · Siente que empeora o que no mejora.  · Los medicamentos no logran aliviar los síntomas.  · Tiene escalofríos.  · La dificultad para respirar es peor.  · Tiene mucosidad marrón o roja.  · Tiene una secreción amarilla o marrón de la nariz.  · Le duele la cara, especialmente al inclinarse hacia adelante.  · Tiene fiebre.  · Tiene los ganglios del cuello hinchados.  · Siente dolor al tragar.   · Tiene zonas blancas en la parte de atrás de la garganta.  SOLICITE AYUDA DE INMEDIATO SI:  · Los siguientes síntomas son muy intensos o constantes:  ? Dolor de cabeza.  ? Dolor de oídos.  ? Dolor en la frente, detrás de los ojos y por encima de los pómulos (dolor sinusal).  ? Dolor en el pecho.  · Tiene enfermedad pulmonar prolongada (crónica) y cualquiera de estos síntomas:  ? Sibilancias.  ? Tos prolongada.  ? Tos con sangre.  ? Cambio en la mucosidad habitual.  · Presenta rigidez en el cuello.  · Tiene cambios en:  ? La visión.  ? La audición.  ? El pensamiento.  ? El estado de ánimo.  ASEGÚRESE DE QUE:  · Comprende estas instrucciones.  · Controlará su afección.  · Recibirá ayuda de inmediato si no mejora o si empeora.  Esta información no tiene como fin reemplazar el consejo del médico. Asegúrese de hacerle al médico cualquier pregunta que tenga.  Document Released: 03/23/2011 Document Revised: 03/05/2015 Document Reviewed: 01/24/2014  Elsevier Interactive Patient Education © 2018 Elsevier Inc[employer]

## 2018-07-28 NOTE — Progress Notes (Signed)
[address] Lovina Reach, is a 52 y.o. male  JXB:[id number]  [id number]:[id number]  DOB - [birth year]  CC:  Chief Complaint  Patient presents with  . URI    productive cough, subjective fever, fatigue, sneezing & runny nose x 2-3 days. denies body aches, nausea, vomiting, diarrhea. sister has similar symptoms(they stay in the same house). hasn't taken anything OTC due to concerns about possible interaction with Keppra      HPI: [name] is a 52 y.o. male is here today to establish care. Previous medical provider or no recent medical provider.   Chronic health problems include:has Diabetes type 2, uncontrolled ([place]); Dysphagia, oropharyngeal; Essential hypertension; S/P craniotomy; Attention and concentration deficit; Type 2 diabetes mellitus with peripheral neuropathy ([place]); Traumatic brain injury with loss of consciousness ([place]); Anemia; Positive depression screening; Seizure disorder ([place]); and Low testosterone on their problem list.  Concerns related to today's visit:  Patient sister is assisting with interpretation today. Onset: 3-4 days. He complains of fatigue, cough (persistent), sore throat, nasal congestion, increased sleep, poor appetite. No headache and facial pressure. No known history of allergies, asthma, or COPD. No history of smoking or exposure to smoking. He has remained afebrile.  He has not attempted any OTC medication. Patient denies chest pain, abdominal pain, nausea, new weakness numbness or tingling, SOB, or edema.  Remainder of Review of Systems negative except as noted in the HPI.   Current medications: Current Outpatient Medications:  .  Blood Glucose Monitoring Suppl (TRUE METRIX METER) w/Device KIT, Use as directed, Disp: 1 kit, Rfl: 0 .  glucose blood (TRUE METRIX BLOOD GLUCOSE TEST) test strip, Use as instructed, Disp: 100 each, Rfl: 12 .  levETIRAcetam (KEPPRA) 500 MG tablet, Take 1 tablet (500 mg total) by mouth 2 (two) times daily., Disp: 60 tablet, Rfl: 4 .   metFORMIN (GLUCOPHAGE) 1000 MG tablet, Take 1 tablet (1,000 mg total) by mouth 2 (two) times daily with a meal., Disp: 180 tablet, Rfl: 3 .  testosterone cypionate (DEPOTESTOSTERONE CYPIONATE) 200 MG/ML injection, Inject 0.5 mLs (100 mg total) into the muscle every 14 (fourteen) days., Disp: 10 mL, Rfl: 5 .  TRUEPLUS LANCETS 28G MISC, Use as directed, Disp: 100 each, Rfl: 6 .  amoxicillin-clavulanate (AUGMENTIN) 875-125 MG tablet, Take 1 tablet by mouth 2 (two) times daily., Disp: 20 tablet, Rfl: 0 .  benzonatate (TESSALON) 100 MG capsule, Take 1-2 capsules (100-200 mg total) by mouth 3 (three) times daily as needed for cough., Disp: 40 capsule, Rfl: 0 .  cetirizine (ZYRTEC) 10 MG tablet, Take 1 tablet (10 mg total) by mouth daily., Disp: 30 tablet, Rfl: 11   Pertinent family medical history: family history includes Diabetes in his father and mother.   Allergies  Allergen Reactions  . Latex Itching and Rash    Social History   Socioeconomic History  . Marital status: Single    Spouse name: Not on file  . Number of children: Not on file  . Years of education: Not on file  . Highest education level: Not on file  Occupational History  . Not on file  Social Needs  . Financial resource strain: Not on file  . Food insecurity:    Worry: Not on file    Inability: Not on file  . Transportation needs:    Medical: Not on file    Non-medical: Not on file  Tobacco Use  . Smoking status: Never Smoker  . Smokeless tobacco: Never Used  Substance and Sexual Activity  . Alcohol use:  Yes    Comment: socially, not since accident  . Drug use: No  . Sexual activity: Not on file  Lifestyle  . Physical activity:    Days per week: Not on file    Minutes per session: Not on file  . Stress: Not on file  Relationships  . Social connections:    Talks on phone: Not on file    Gets together: Not on file    Attends religious service: Not on file    Active member of club or organization: Not on file     Attends meetings of clubs or organizations: Not on file    Relationship status: Not on file  . Intimate partner violence:    Fear of current or ex partner: Not on file    Emotionally abused: Not on file    Physically abused: Not on file    Forced sexual activity: Not on file  Other Topics Concern  . Not on file  Social History Narrative  . Not on file    Review of Systems: Pertinent negatives listed in HPI Objective:   Vitals:   07/28/18 1511  BP: 123/79  Pulse: 79  Resp: 17  Temp: 99.4 F (37.4 C)  SpO2: 96%   Physical Exam: General Appearance:    Alert, cooperative, no distress  HENT:   bilateral TM normal without fluid or infection, neck has bilateral anterior cervical nodes enlarged, throat normal without erythema or exudate, pharynx erythematous without exudate and nasal mucosa congested  Eyes:    PERRL, conjunctiva/corneas clear, EOM's intact       Lungs:     Clear to auscultation bilaterally, respirations unlabored, persistent cough (nonproductive)  Heart:    Regular rate and rhythm  Neurologic:   Awake, alert, oriented x 3. No apparent focal neurological           defect.    Lab Results  Component Value Date   WBC 5.5 05/10/2018   HGB 12.1 (L) 05/10/2018   HCT 34.0 (L) 05/10/2018   MCV 85.9 05/10/2018   PLT 206 05/10/2018   Lab Results  Component Value Date   CREATININE 0.91 05/10/2018   BUN 14 05/10/2018   NA 138 05/10/2018   K 3.8 05/10/2018   CL 103 05/10/2018   CO2 26 05/10/2018    Lab Results  Component Value Date   HGBA1C 5.1 06/16/2018    Lipid Panel     Component Value Date/Time   CHOL 263 (H) 06/02/2016 1232   TRIG 88 01/16/2017 0548   HDL 51 06/02/2016 1232   CHOLHDL 5.2 (H) 06/02/2016 1232   VLDL NOT CALC 06/02/2016 1232   LDLCALC NOT CALC 06/02/2016 1232        Assessment and plan:  1. Upper respiratory tract infection, unspecified type 2. Sore throat Start Augmentin 1 tablet every 12 hours x 10 days.  Start over the  counter Cetrizine 10 mg once daily at bedtime  Take Benzonatate 100-200 mg 3 times daily as needed.   Meds ordered this encounter  Medications  . benzonatate (TESSALON) 100 MG capsule    Sig: Take 1-2 capsules (100-200 mg total) by mouth 3 (three) times daily as needed for cough.    Dispense:  40 capsule    Refill:  0  . cetirizine (ZYRTEC) 10 MG tablet    Sig: Take 1 tablet (10 mg total) by mouth daily.    Dispense:  30 tablet    Refill:  11  . amoxicillin-clavulanate (AUGMENTIN)  875-125 MG tablet    Sig: Take 1 tablet by mouth 2 (two) times daily.    Dispense:  20 tablet    Refill:  0   Carroll Sage. Kenton Kingfisher, MSN, [facility] and [place], [place], [place] [address] [phone]    The patient was given clear instructions to go to ER or return to medical center if symptoms don't improve, worsen or new problems develop. The patient verbalized understanding. The patient was told to call to get lab results if they haven't heard anything in the next week.     This note has been created with Surveyor, quantity. Any transcriptional errors are unintentional.

## 2018-08-23 MED FILL — levETIRAcetam 500 MG TABS: 500 | 30 days supply | Qty: 60 | Fill #2

## 2018-08-23 MED FILL — ?METFORMIN HCL 1000MG TABS: 1000 | 30 days supply | Qty: 60 | Fill #3

## 2018-08-30 ENCOUNTER — Encounter: Payer: Self-pay | Admitting: Neurology

## 2018-08-30 ENCOUNTER — Ambulatory Visit (INDEPENDENT_AMBULATORY_CARE_PROVIDER_SITE_OTHER): Payer: Self-pay | Admitting: Neurology

## 2018-08-30 ENCOUNTER — Other Ambulatory Visit: Payer: Self-pay

## 2018-08-30 VITALS — BP 120/80 | HR 78 | Ht 61.0 in | Wt 144.4 lb

## 2018-08-30 DIAGNOSIS — Z87828 Personal history of other (healed) physical injury and trauma: Secondary | ICD-10-CM

## 2018-08-30 DIAGNOSIS — G40209 Localization-related (focal) (partial) symptomatic epilepsy and epileptic syndromes with complex partial seizures, not intractable, without status epilepticus: Secondary | ICD-10-CM

## 2018-08-30 DIAGNOSIS — Z87820 Personal history of traumatic brain injury: Secondary | ICD-10-CM

## 2018-08-30 MED ORDER — LEVETIRACETAM ER 500 MG PO TB24: ORAL_TABLET | ORAL | 11 refills | Status: DC

## 2018-08-30 MED FILL — LEVETIRACETAM ER 500 MG TAB: 500 | 30 days supply | Qty: 60 | Fill #0

## 2018-08-30 NOTE — Patient Instructions (Addendum)
1. Intentemos cambiar a Keppra XR 500mg : tome 2 tabletas cada noche. Si el costo es [place], volveremos a Keppra 500mg  dos veces al da.  2. Programar EEG de rutina  3. Seguimiento en 6 meses, llame para cualquier cambio  Precauciones de convulsin: 1. Si le recetaron medicamentos para prevenir las convulsiones, tmelos exactamente como se le indic. No deje de tomar el medicamento sin hablar primero con su mdico, incluso si no ha tenido Office manager.  2. Evite actividades en las cuales una convulsin podra causarle peligro a usted mismo o a otros. No opere maquinaria peligrosa, nade solo o trepe en lugares altos o peligrosos, como escaleras, techos o vigas. No conduzca a menos que su mdico le indique que puede [place].  3. Si tiene [place] advertencia de que puede tener una convulsin, acustese en un lugar seguro donde no pueda lastimarse.  4. No conducir durante 6 meses desde la ltima incautacin, segn la ley estatal de [place]. Para obtener ms informacin, consulte el siguiente enlace en el sitio web de la Epile[employer] of [place]: http://www.epilepsyfoundation.org/answerplace/Social/driving/drivingu.cfm  5. Mantener una buena higiene del sueo. Evite el alcohol.  6. Comunquese con su mdico si tiene algn problema relacionado con el medicamento que est tomando.  7. Llame al 911 y devuelva al paciente al servicio de urgencias si:       A. La convulsin dura ms de 5 minutos. B. El paciente no se despierta poco despus de la convulsin. C. El paciente tiene nuevos problemas, como dificultad para ver, hablar o moverse. D. El paciente result herido durante la convulsin. E. El paciente tiene [place] superior a 102 F (39C) F. El paciente vomit y ahora tiene problemas para Industrial/product designer.
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
## 2018-09-05 ENCOUNTER — Ambulatory Visit (INDEPENDENT_AMBULATORY_CARE_PROVIDER_SITE_OTHER): Payer: Self-pay | Admitting: Neurology

## 2018-09-05 DIAGNOSIS — Z87828 Personal history of other (healed) physical injury and trauma: Secondary | ICD-10-CM

## 2018-09-05 DIAGNOSIS — G40209 Localization-related (focal) (partial) symptomatic epilepsy and epileptic syndromes with complex partial seizures, not intractable, without status epilepticus: Secondary | ICD-10-CM

## 2018-09-06 NOTE — Procedures (Signed)
ELECTROENCEPHALOGRAM REPORT  Date of Study: 09/05/2018  Patient's Name: [name] MRN: [id number] Date of Birth: 11/10/1965  Referring Provider: Dr. Patrcia Dolly  Clinical History: This is a 52 year old man with history of TBI, recent seizure with left head deviation.  Medications: KEPPRA 500 MG tablet  AUGMENTIN 875-125 MG tablet  TESSALON 100 MG capsule  ZYRTEC 10 MG tablet  GLUCOPHAGE 1000 MG tablet  DEPOTESTOSTERONE CYPIONATE 200 MG/ML injection    Technical Summary: A multichannel digital EEG recording measured by the international 10-20 system with electrodes applied with paste and impedances below 5000 ohms performed in our laboratory with EKG monitoring in an awake and drowsy patient.  Hyperventilation was not performed. Photic stimulation was performed.  The digital EEG was referentially recorded, reformatted, and digitally filtered in a variety of bipolar and referential montages for optimal display.    Description: The patient is awake and asleep during the recording.  During maximal wakefulness, there is a symmetric, medium voltage 9 Hz posterior dominant rhythm that attenuates with eye opening.  The record is symmetric.  During drowsiness, there is an increase in theta slowing of the background. Deeper stages of sleep were not seen. Photic stimulation did not elicit any abnormalities.  There were no epileptiform discharges or electrographic seizures seen.    EKG lead was unremarkable.  Impression: This awake and drowsy EEG is normal.    Clinical Correlation: A normal EEG does not exclude a clinical diagnosis of epilepsy.  If further clinical questions remain, prolonged EEG may be helpful.  Clinical correlation is advised.   Patrcia Dolly, M.D.

## 2018-09-07 ENCOUNTER — Telehealth: Payer: Self-pay | Admitting: Internal Medicine

## 2018-09-07 DIAGNOSIS — Z9189 Other specified personal risk factors, not elsewhere classified: Secondary | ICD-10-CM

## 2018-09-07 NOTE — Telephone Encounter (Signed)
Patient sister called requesting a dental referral for the patient. Patient needs dental cleaning. Please f/u

## 2018-09-07 NOTE — Telephone Encounter (Signed)
Will forward to pcp

## 2018-09-09 ENCOUNTER — Ambulatory Visit: Payer: Self-pay | Attending: Internal Medicine | Admitting: Internal Medicine

## 2018-09-09 ENCOUNTER — Encounter: Payer: Self-pay | Admitting: Internal Medicine

## 2018-09-09 VITALS — BP 120/73 | HR 73 | Temp 98.1°F | Resp 16 | Wt 144.4 lb

## 2018-09-09 DIAGNOSIS — E119 Type 2 diabetes mellitus without complications: Secondary | ICD-10-CM | POA: Insufficient documentation

## 2018-09-09 DIAGNOSIS — Z7984 Long term (current) use of oral hypoglycemic drugs: Secondary | ICD-10-CM | POA: Insufficient documentation

## 2018-09-09 DIAGNOSIS — R569 Unspecified convulsions: Secondary | ICD-10-CM | POA: Insufficient documentation

## 2018-09-09 DIAGNOSIS — G40909 Epilepsy, unspecified, not intractable, without status epilepticus: Secondary | ICD-10-CM

## 2018-09-09 DIAGNOSIS — Z23 Encounter for immunization: Secondary | ICD-10-CM | POA: Insufficient documentation

## 2018-09-09 DIAGNOSIS — L602 Onychogryphosis: Secondary | ICD-10-CM

## 2018-09-09 DIAGNOSIS — I10 Essential (primary) hypertension: Secondary | ICD-10-CM | POA: Insufficient documentation

## 2018-09-09 DIAGNOSIS — Z1211 Encounter for screening for malignant neoplasm of colon: Secondary | ICD-10-CM

## 2018-09-09 LAB — GLUCOSE, POCT (MANUAL RESULT ENTRY): POC Glucose: 153 mg/dl — AB (ref 70–99)

## 2018-09-09 NOTE — Patient Instructions (Signed)
Influenza Virus Vaccine injection (Fluarix) Qu es este medicamento? La VACUNA ANTIGRIPAL ayuda a disminuir el riesgo de contraer la influenza, tambin conocida como la gripe. La vacuna solo ayuda a protegerle contra algunas cepas de influenza. Esta vacuna no ayuda a reducir el riesgo de contraer influenza pandmica H1N1. Este medicamento puede ser utilizado para otros usos; si tiene alguna pregunta consulte con su proveedor de atencin mdica o con su farmacutico. MARCAS COMUNES: Fluarix, Fluzone Qu le debo informar a mi profesional de la salud antes de tomar este medicamento? Necesita saber si usted presenta alguno de los siguientes problemas o situaciones: -trastorno de sangrado como hemofilia -fiebre o infeccin -sndrome de Guillain-Barre u otros problemas neurolgicos -problemas del sistema inmunolgico -infeccin por el virus de la inmunodeficiencia humana (VIH) o SIDA -niveles bajos de plaquetas en la sangre -esclerosis mltiple -una reaccin alrgica o inusual a las vacunas antigripales, a los huevos, protenas de pollo, al ltex, a la gentamicina, a otros medicamentos, alimentos, colorantes o conservantes -si est embarazada o buscando quedar embarazada -si est amamantando a un beb Cmo debo utilizar este medicamento? Esta vacuna se administra mediante inyeccin por va intramuscular. Lo administra un profesional de la salud. Recibir una copia de informacin escrita sobre la vacuna antes de cada vacuna. Asegrese de leer este folleto cada vez cuidadosamente. Este folleto puede cambiar con frecuencia. Hable con su pediatra para informarse acerca del uso de este medicamento en nios. Puede requerir atencin especial. Sobredosis: Pngase en contacto inmediatamente con un centro toxicolgico o una sala de urgencia si usted cree que haya tomado demasiado medicamento. ATENCIN: Este medicamento es solo para usted. No comparta este medicamento con nadie. Qu sucede si me olvido de  una dosis? No se aplica en este caso. Qu puede interactuar con este medicamento? -quimioterapia o radioterapia -medicamentos que suprimen el sistema inmunolgico, tales como etanercept, anakinra, infliximab y adalimumab -medicamentos que tratan o previenen cogulos sanguneos, como warfarina -fenitona -medicamentos esteroideos, como la prednisona o la cortisona -teofilina -vacunas Puede ser que esta lista no menciona todas las posibles interacciones. Informe a su profesional de la salud de todos los productos a base de hierbas, medicamentos de venta libre o suplementos nutritivos que est tomando. Si usted fuma, consume bebidas alcohlicas o si utiliza drogas ilegales, indqueselo tambin a su profesional de la salud. Algunas sustancias pueden interactuar con su medicamento. A qu debo estar atento al usar este medicamento? Informe a su mdico o a su profesional de la salud sobre todos los efectos secundarios que persistan despus de 3 das. Llame a su proveedor de atencin mdica si se presentan sntomas inusuales dentro de las 6 semanas posteriores a la vacunacin. Es posible que todava pueda contraer la gripe, pero la enfermedad no ser tan fuerte como normalmente. No puede contraer la gripe de esta vacuna. La vacuna antigripal no le protege contra resfros u otras enfermedades que pueden causar fiebre. Debe vacunarse cada ao. Qu efectos secundarios puedo tener al utilizar este medicamento? Efectos secundarios que debe informar a su mdico o a su profesional de la salud tan pronto como sea posible: -reacciones alrgicas como erupcin cutnea, picazn o urticarias, hinchazn de la cara, labios o lengua Efectos secundarios que, por lo general, no requieren atencin mdica (debe informarlos a su mdico o a su profesional de la salud si persisten o si son molestos): -fiebre -dolor de cabeza -molestias y dolores musculares -dolor, sensibilidad, enrojecimiento o hinchazn en el lugar de la  inyeccin -cansancio o debilidad Puede ser que esta lista no   menciona todos los posibles efectos secundarios. Comunquese a su mdico por asesoramiento mdico sobre los efectos secundarios. Usted puede informar los efectos secundarios a la FDA por telfono al 1-800-FDA-1088. Dnde debo guardar mi medicina? Esta vacuna se administra solamente en clnicas, farmacias, consultorio mdico u otro consultorio de un profesional de la salud y no necesitar guardarlo en su domicilio. ATENCIN: Este folleto es un resumen. Puede ser que no cubra toda la posible informacin. Si usted tiene preguntas acerca de esta medicina, consulte con su mdico, su farmacutico o su profesional de la salud.  2018 Elsevier/Gold Standard (2010-04-22 15:31:40)  

## 2018-09-09 NOTE — Progress Notes (Signed)
cbg [id number] 

## 2018-09-09 NOTE — Progress Notes (Signed)
Patient ID: [name], male    DOB: Nov 25, 1965  MRN: [id number]  CC: Diabetes and Cough   Subjective: [name] is a 52 y.o. male who presents for chronic ds management.  Sister [name], is with him and interprets.  His concerns today include:  Patient is a 52 year old male with history of diabetes type 2,HTN, low testosterone, grand-mal sz and craniotomy due tointracranial bleed, cranial fractures and subdural hematomas2018.  Sz disorder:  Saw Dr. Delice Lesch since last visit.  NL EEG. Keppra changed to XR form.  No sz since last visit. Pt now taking english classes once a wk. He is helping with simple household chores.  DM:  Sister checks BS 2 x a wk.  Range 120-130.  Post meals BS no higher than 160.  Doing better with portion sizes and not eating constantly Goes to gym at least 4 x a wk.   He rides stationary bike  Patient Active Problem List   Diagnosis Date Noted  . Seizure disorder ([place]) 06/16/2018  . Low testosterone 06/16/2018  . Traumatic brain injury with loss of consciousness ([place]) 10/29/2017  . Anemia 10/29/2017  . Positive depression screening 10/29/2017  . Attention and concentration deficit   . Type 2 diabetes mellitus with peripheral neuropathy (HCC)   . S/P craniotomy 03/19/2017  . Essential hypertension   . Dysphagia, oropharyngeal 02/01/2017  . Diabetes type 2, uncontrolled ([facility]) 06/13/2015     Current Outpatient Medications on File Prior to Visit  Medication Sig Dispense Refill  . Blood Glucose Monitoring Suppl (TRUE METRIX METER) w/Device KIT Use as directed 1 kit 0  . cetirizine (ZYRTEC) 10 MG tablet Take 1 tablet (10 mg total) by mouth daily. 30 tablet 11  . glucose blood (TRUE METRIX BLOOD GLUCOSE TEST) test strip Use as instructed 100 each 12  . levETIRAcetam (KEPPRA XR) 500 MG 24 hr tablet Take 2 tablets every night 60 tablet 11  . metFORMIN (GLUCOPHAGE) 1000 MG tablet Take 1 tablet (1,000 mg total) by mouth 2 (two) times  daily with a meal. 180 tablet 3  . testosterone cypionate (DEPOTESTOSTERONE CYPIONATE) 200 MG/ML injection Inject 0.5 mLs (100 mg total) into the muscle every 14 (fourteen) days. 10 mL 5  . TRUEPLUS LANCETS 28G MISC Use as directed 100 each 6   No current facility-administered medications on file prior to visit.     Allergies  Allergen Reactions  . Latex Itching and Rash    Social History   Socioeconomic History  . Marital status: Single    Spouse name: Not on file  . Number of children: 1  . Years of education: 58  . Highest education level: High school graduate  Occupational History  . 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 Financial resource strain: Not on file  . Food insecurity:    Worry: Not on file    Inability: Not on file  . Transportation needs:    Medical: Not on file    Non-medical: Not on file  Tobacco Use  . Smoking status: Never Smoker  . Smokeless tobacco: Never Used  Substance and Sexual Activity  . Alcohol use: Yes    Comment: socially, not since accident  . Drug use: No  . Sexual activity: Not on file  Lifestyle  . Physical activity:    Days per week: Not on file    Minutes per session: Not on file  . Stress: Not on file  Relationships  . Social connections:  Talks on phone: Not on file    Gets together: Not on file    Attends religious service: Not on file    Active member of club or organization: Not on file    Attends meetings of clubs or organizations: Not on file    Relationship status: Not on file  . Intimate partner violence:    Fear of current or ex partner: Not on file    Emotionally abused: Not on file    Physically abused: Not on file    Forced sexual activity: Not on file  Other Topics Concern  . Not on file  Social History Narrative   Lives with sister and her family.  Has one daughter in [place].  Does not work.  Education: high school.     Family History  Problem Relation Age of Onset  . Diabetes Mother   . Diabetes  Father     Past Surgical History:  Procedure Laterality Date  . CRANIOTOMY Right 01/13/2017   Procedure: 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, Placement of bone flap in abdomen;  Surgeon: Ashok Pall, MD;  Location: [facility];  Service: Neurosurgery;  Laterality: Right;  . CRANIOTOMY Right 03/19/2017   Procedure: Cranioplasty with placement of bone flap from abdomen;  Surgeon: Eustace Moore, MD;  Location: [facility];  Service: Neurosurgery;  Laterality: Right;    ROS: Review of Systems Neg except as stated above PHYSICAL EXAM: BP 120/73   Pulse 73   Temp 98.1 F (36.7 C) (Oral)   Resp 16   Wt 144 lb 6.4 oz (65.5 kg)   SpO2 99%   BMI 27.28 kg/m   Wt Readings from Last 3 Encounters:  09/09/18 144 lb 6.4 oz (65.5 kg)  08/30/18 144 lb 6 oz (65.5 kg)  07/28/18 142 lb (64.4 kg)    Physical Exam General appearance - alert, well appearing, and in no distress Mental status -patient smiles a lot.  He is oriented to person, day of the week and month but not year. Neck - supple, no significant adenopathy Chest - clear to auscultation, no wheezes, rales or rhonchi, symmetric air entry Heart - normal rate, regular rhythm, normal S1, S2, no murmurs, rubs, clicks or gallops Extremities - peripheral pulses normal, no pedal edema, no clubbing or cyanosis  Diabetic Foot Exam - Simple   Simple Foot Form Visual Inspection See comments:  Yes Sensation Testing Intact to touch and monofilament testing bilaterally:  Yes Pulse Check Posterior Tibialis and Dorsalis pulse intact bilaterally:  Yes Comments Toe nails are overgrown.     ASSESSMENT AND PLAN:  1. Controlled type 2 diabetes mellitus without complication, without long-term current use of insulin (HCC) Continue metformin and healthy eating habits.  Commended patient on being more engaged at home now.  He is helping with household chores.  Continue regular exercise.   - POCT glucose (manual entry) - CBC -  Lipid panel  2. Need for immunization against influenza - Flu Vaccine QUAD 36+ mos IM  3. Colon cancer screening - Fecal occult blood, imunochemical([facility])  4. Overgrown toenails Sister plans to clip his nails.  5. Seizure disorder ([place]) Stable on Keppra.   Patient was given the opportunity to ask questions.  Patient verbalized understanding of the plan and was able to repeat key elements of the plan.   Orders Placed This Encounter  Procedures  . Fecal occult blood, imunochemical([facility])  . Flu Vaccine QUAD 36+ mos IM  . CBC  . Lipid  panel  . POCT glucose (manual entry)     Requested Prescriptions    No prescriptions requested or ordered in this encounter    Return in about 3 months (around 12/10/2018).  Karle Plumber, MD, FACP

## 2018-09-10 ENCOUNTER — Other Ambulatory Visit: Payer: Self-pay | Admitting: Internal Medicine

## 2018-09-10 LAB — LIPID PANEL
CHOLESTEROL TOTAL: 200 mg/dL — AB (ref 100–199)
Chol/HDL Ratio: 4 ratio (ref 0.0–5.0)
HDL: 50 mg/dL (ref >39)
LDL Calculated: 117 mg/dL — ABNORMAL HIGH (ref 0–99)
TRIGLYCERIDES: 167 mg/dL — AB (ref 0–149)
VLDL CHOLESTEROL CAL: 33 mg/dL (ref 5–40)

## 2018-09-10 LAB — CBC
HEMOGLOBIN: 13.2 g/dL (ref 13.0–17.7)
Hematocrit: 38.6 % (ref 37.5–51.0)
MCH: 29.7 pg (ref 26.6–33.0)
MCHC: 34.2 g/dL (ref 31.5–35.7)
MCV: 87 fL (ref 79–97)
Platelets: 232 10*3/uL (ref 150–450)
RBC: 4.44 x10E6/uL (ref 4.14–5.80)
RDW: 12.6 % (ref 12.3–15.4)
WBC: 4.6 10*3/uL (ref 3.4–10.8)

## 2018-09-10 MED ORDER — ATORVASTATIN CALCIUM 10 MG PO TABS: 10.0000 mg | ORAL_TABLET | Freq: Every day | ORAL | 3 refills | Status: DC

## 2018-09-12 MED FILL — ?ATORVASTATIN 10 MG TABLET: 10 | 30 days supply | Qty: 30 | Fill #0

## 2018-09-16 MED FILL — ?METFORMIN HCL 1000MG TABS: 1000 | 30 days supply | Qty: 60 | Fill #4

## 2018-09-16 MED FILL — levETIRAcetam 500 MG TABS: 500 | 30 days supply | Qty: 60 | Fill #3

## 2018-09-21 ENCOUNTER — Encounter: Payer: Self-pay | Attending: Physical Medicine & Rehabilitation | Admitting: Physical Medicine & Rehabilitation

## 2018-09-21 ENCOUNTER — Encounter: Payer: Self-pay | Admitting: Physical Medicine & Rehabilitation

## 2018-09-21 VITALS — BP 111/74 | HR 83 | Resp 14 | Ht 61.0 in | Wt 145.0 lb

## 2018-09-21 DIAGNOSIS — X58XXXS Exposure to other specified factors, sequela: Secondary | ICD-10-CM | POA: Insufficient documentation

## 2018-09-21 DIAGNOSIS — S069X9S Unspecified intracranial injury with loss of consciousness of unspecified duration, sequela: Secondary | ICD-10-CM | POA: Insufficient documentation

## 2018-09-21 DIAGNOSIS — E039 Hypothyroidism, unspecified: Secondary | ICD-10-CM | POA: Insufficient documentation

## 2018-09-21 DIAGNOSIS — G40909 Epilepsy, unspecified, not intractable, without status epilepticus: Secondary | ICD-10-CM

## 2018-09-21 DIAGNOSIS — S0219XS Other fracture of base of skull, sequela: Secondary | ICD-10-CM | POA: Insufficient documentation

## 2018-09-21 DIAGNOSIS — R569 Unspecified convulsions: Secondary | ICD-10-CM | POA: Insufficient documentation

## 2018-09-21 DIAGNOSIS — R7989 Other specified abnormal findings of blood chemistry: Secondary | ICD-10-CM

## 2018-09-21 DIAGNOSIS — Z9889 Other specified postprocedural states: Secondary | ICD-10-CM | POA: Insufficient documentation

## 2018-09-21 DIAGNOSIS — R5383 Other fatigue: Secondary | ICD-10-CM | POA: Insufficient documentation

## 2018-09-21 DIAGNOSIS — E1142 Type 2 diabetes mellitus with diabetic polyneuropathy: Secondary | ICD-10-CM | POA: Insufficient documentation

## 2018-09-21 DIAGNOSIS — E038 Other specified hypothyroidism: Secondary | ICD-10-CM

## 2018-09-21 DIAGNOSIS — F329 Major depressive disorder, single episode, unspecified: Secondary | ICD-10-CM | POA: Insufficient documentation

## 2018-09-21 DIAGNOSIS — R269 Unspecified abnormalities of gait and mobility: Secondary | ICD-10-CM | POA: Insufficient documentation

## 2018-09-21 DIAGNOSIS — I10 Essential (primary) hypertension: Secondary | ICD-10-CM | POA: Insufficient documentation

## 2018-09-21 NOTE — Patient Instructions (Signed)
PLEASE FEEL FREE TO CALL OUR OFFICE WITH ANY PROBLEMS OR QUESTIONS ([phone])      

## 2018-09-21 NOTE — Progress Notes (Signed)
Subjective:    Patient ID: [name], male    DOB: November 05, 1965, 52 y.o.   MRN: [id number]  HPI Irving ShowsMiguel is here for a six-month follow-up regarding his traumatic brain injury.  He was continuing to make nice progress at the time of our visit in May.  He was in the emergency room in July with seizures.  He was loaded with Keppra 500 mg twice daily.  He was seen by Dr. Karel JarvisAquino 3 weeks ago we changed him to extended release Keppra to help with daytime drowsiness.  She ordered an EEG which was normal. Wife states that he still is very drowsy. He's sleeping at night but also has struggles staying awake during the day. Sister is also concerned that he's depresssed.  His sugars are under control. He sometimes sneaks sweet.   Pain Inventory Average Pain 0 Pain Right Now 0 My pain is no pain  In the last 24 hours, has pain interfered with the following? General activity 0 Relation with others 0 Enjoyment of life 0 What TIME of day is your pain at its worst? no pain Sleep (in general) Fair  Pain is worse with: no pain Pain improves with:  no pain Relief from Meds: no pain  Mobility walk without assistance needs help with transfers  Function disabled: date disabled .  Neuro/Psych confusion  Prior Studies Any changes since last visit?  no  Physicians involved in your care Any changes since last visit?  no   Family History  Problem Relation Age of Onset  . Diabetes Mother   . Diabetes Father    Social History   Socioeconomic History  . Marital status: Single    Spouse name: Not on file  . Number of children: 1  . Years of education: 4412  . Highest education level: High school graduate  Occupational History  . 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 Financial resource strain: Not on file  . Food insecurity:    Worry: Not on file    Inability: Not on file  . Transportation needs:    Medical: Not on file    Non-medical: Not on file  Tobacco Use  . Smoking  status: Never Smoker  . Smokeless tobacco: Never Used  Substance and Sexual Activity  . Alcohol use: Yes    Comment: socially, not since accident  . Drug use: No  . Sexual activity: Not on file  Lifestyle  . Physical activity:    Days per week: Not on file    Minutes per session: Not on file  . Stress: Not on file  Relationships  . Social connections:    Talks on phone: Not on file    Gets together: Not on file    Attends religious service: Not on file    Active member of club or organization: Not on file    Attends meetings of clubs or organizations: Not on file    Relationship status: Not on file  Other Topics Concern  . Not on file  Social History Narrative   Lives with sister and her family.  Has one daughter in [place].  Does not work.  Education: high school.    Past Surgical History:  Procedure Laterality Date  . CRANIOTOMY Right 01/13/2017   Procedure: 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, Placement of bone flap in abdomen;  Surgeon: Coletta MemosKyle Cabbell, MD;  [facility];  Service: Neurosurgery;  Laterality: Right;  . CRANIOTOMY Right 03/19/2017  Procedure: Cranioplasty with placement of bone flap from abdomen;  Surgeon: Tia Alert, MD;  Location[facility];  Service: Neurosurgery;  Laterality: Right;   Past Medical History:  Diagnosis Date  . Diabetes mellitus without complication (HCC)   . Seizures (HCC)   . TBI (traumatic brain injury) (HCC) 03/2017   There were no vitals taken for this visit.  Opioid Risk Score:   Fall Risk Score:  `1  Depression screen PHQ 2/9  Depression screen [facility] 2/9 07/28/2018 06/16/2018 03/04/2018 10/29/2017 06/29/2017 05/04/2017 04/19/2017  Decreased Interest 1 0 2 2 0 0 0  Down, Depressed, Hopeless 0 0 0 0 0 0 0  PHQ - 2 Score 1 0 2 2 0 0 0  Altered sleeping 1 - 1 3 0 0 3  Tired, decreased energy 2 - 0 0 3 1 0  Change in appetite 2 - 2 0 0 0 0  Feeling bad or failure about yourself  0 - 0 0 0 0 0  Trouble  concentrating 0 - 0 2 0 1 0  Moving slowly or fidgety/restless 1 - 0 0 0 0 0  Suicidal thoughts 0 - 0 0 0 0 0  PHQ-9 Score 7 - 5 7 3 2 3     Review of Systems  Constitutional: Negative.   HENT: Negative.   Eyes: Negative.   Respiratory: Negative.   Cardiovascular: Negative.   Gastrointestinal: Negative.   Endocrine: Negative.   Genitourinary: Negative.   Musculoskeletal: Negative.   Skin: Negative.   Allergic/Immunologic: Negative.   Neurological: Negative.   Hematological: Negative.   Psychiatric/Behavioral: Positive for confusion.  All other systems reviewed and are negative.      Objective:   Physical Exam General: No acute distress HEENT: EOMI, oral membranes moist Cards: reg rate  Chest: normal effort Abdomen: Soft, NT, ND Skin: dry, intact Extremities: no edema Musculoskeletal: He exhibits no edema or tenderness.  Neurological: He is alert.  Motor: 5/5in all fours. walks with bilateral steppage gait, almost a march.  Skin: Skin is warm and dry.  Psychiatric:flat, distracted.     Assessment & Plan:  1. Decreased functional mobility secondary to right intraparenchymal hemorrhage-SDH-temporal bone fracture after fall. Status post right frontotemporoparietal craniectomy 01/14/2017 with cranioplasty 03/19/2017 -continue to push functional independence at home     -[facility] to address his gait  -sister stressed by the care he requires and became very tearful during our visit 2. Seizure prophylaxis. keppra resumed, 1000mg  at night.  3. Diabetes mellitus peripheral neuropathy. Wife states that sugars are under control 4. Hypertension. bp controlled off meds 5. Fatigue:           - likely partitally due to keppra---may need to ultimately discuss other options with neurology.     -testosterone and thyroid were also low last may. He's on testosterone supplement now           -will order repeat thyroid, testosterone studies     -depression likley playing a role too. Consider antidepressant depending upon the results of the above  of face to face patient care time were spent during this visit. All questions were encouraged and answered. Follow up in2 months. Marland Kitchen

## 2018-09-23 ENCOUNTER — Telehealth: Payer: Self-pay | Admitting: Physical Medicine & Rehabilitation

## 2018-09-23 DIAGNOSIS — R7989 Other specified abnormal findings of blood chemistry: Secondary | ICD-10-CM

## 2018-09-23 LAB — T4, FREE: Free T4: 1.35 ng/dL (ref 0.82–1.77)

## 2018-09-23 LAB — TESTOSTERONE, FREE: Testosterone, Free: 6.3 pg/mL — ABNORMAL LOW (ref 7.2–24.0)

## 2018-09-23 LAB — TESTOSTERONE: Testosterone: 230 ng/dL — ABNORMAL LOW (ref 264–916)

## 2018-09-23 LAB — T4: T4, Total: 8.4 ug/dL (ref 4.5–12.0)

## 2018-09-23 MED ORDER — TESTOSTERONE CYPIONATE 200 MG/ML IM SOLN: 200.0000 mg | INTRAMUSCULAR | 5 refills | Status: DC

## 2018-09-23 NOTE — Telephone Encounter (Signed)
[name]'s testosterone is still low. I recommend increasing him to 200mg  every two weeks. I have sent new rx to his pharmacy.

## 2018-09-26 ENCOUNTER — Telehealth: Payer: Self-pay | Admitting: *Deleted

## 2018-09-26 NOTE — Telephone Encounter (Signed)
I left message per DPR on Mr [name] # informing him of the new order sent to pharmacy increasing testosterone and if questions to call our office or speak with the pharmacist.

## 2018-09-26 NOTE — Telephone Encounter (Signed)
[name] returned the call from this morning about his testosterone increase but she did not answer when I called her back.  I left detailed message about testosterone increase.

## 2018-10-05 ENCOUNTER — Other Ambulatory Visit: Payer: Self-pay | Admitting: Physical Medicine & Rehabilitation

## 2018-10-05 DIAGNOSIS — R7989 Other specified abnormal findings of blood chemistry: Secondary | ICD-10-CM

## 2018-10-05 MED ORDER — TESTOSTERONE CYPIONATE 200 MG/ML IM SOLN
200.0000 mg | INTRAMUSCULAR | 5 refills | Status: DC
Start: 2018-10-05 — End: 2019-01-25

## 2018-10-05 MED ORDER — TESTOSTERONE CYPIONATE 200 MG/ML IM SOLN
200.0000 mg | INTRAMUSCULAR | 5 refills | Status: DC
Start: 2018-10-05 — End: 2018-10-05

## 2018-10-18 ENCOUNTER — Ambulatory Visit: Payer: Self-pay | Attending: Physical Medicine & Rehabilitation | Admitting: Physical Therapy

## 2018-10-18 DIAGNOSIS — R2689 Other abnormalities of gait and mobility: Secondary | ICD-10-CM | POA: Insufficient documentation

## 2018-10-18 DIAGNOSIS — R2681 Unsteadiness on feet: Secondary | ICD-10-CM | POA: Insufficient documentation

## 2018-10-18 DIAGNOSIS — R278 Other lack of coordination: Secondary | ICD-10-CM | POA: Insufficient documentation

## 2018-10-18 MED FILL — LEVETIRACETAM ER 500 MG TAB: 500 | 30 days supply | Qty: 60 | Fill #1

## 2018-10-18 MED FILL — ?ATORVASTATIN 10 MG TABLET: 10 | 30 days supply | Qty: 30 | Fill #1

## 2018-10-18 MED FILL — ?METFORMIN HCL 1,000 MG TAB: 1000 | 30 days supply | Qty: 60 | Fill #5

## 2018-10-18 NOTE — Patient Instructions (Signed)
SINGLE LIMB STANCE    Copyright  VHI. All rights reserved.    Stance: single leg on floor. Raise leg. Hold _10__ seconds. Repeat with other leg. _2__ reps per set, _2_ sets per day, __5_ days per week   Feet Partial Heel-Toe, Arm Motion - Eyes open    With eyes closed and right foot partially in front of the other, move arms up and down: to front. Repeat __2__ times per session. Do ___2_ sessions per day. Hold for 30 secs   DO not need to do arm motions

## 2018-10-19 ENCOUNTER — Other Ambulatory Visit: Payer: Self-pay

## 2018-10-19 NOTE — Therapy (Signed)
[facility] [address] [place], [place], [address] Phone: [phone]   Fax:  [phone]  Physical Therapy Evaluation  Patient Details  Name: [name] MRN: [id number] Date of Birth: [birth year] Referring Provider (PT): Dr. Faith Rogue   Encounter Date: 10/18/2018  PT End of Session - 10/19/18 1920    Visit Number  1    Number of Visits  9    Date for PT Re-Evaluation  11/18/18    Authorization Type  [facility] [employer] 08-01-18 - 01-03-19    Authorization Time Period  08-01-18 - 01-03-19    PT Start Time  0803    PT Stop Time  0847    PT Time Calculation (min)  44 min       Past Medical History:  Diagnosis Date  . Diabetes mellitus without complication (HCC)   . Seizures (HCC)   . TBI (traumatic brain injury) (HCC) 03/2017    Past Surgical History:  Procedure Laterality Date  . CRANIOTOMY Right 01/13/2017   Procedure: 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, Placement of bone flap in abdomen;  Surgeon: Coletta Memos, MD;  Location: [facility] OR;  Service: Neurosurgery;  Laterality: Right;  . CRANIOTOMY Right 03/19/2017   Procedure: Cranioplasty with placement of bone flap from abdomen;  Surgeon: Tia Alert, MD;  Location: [facility] OR;  Service: Neurosurgery;  Laterality: Right;    There were no vitals filed for this visit.   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  No/denies         [facility] PT Assessment - 10/19/18 0001      Assessment   Medical Diagnosis  TBI due to SDH     Referring Provider (PT)  Dr. Faith Rogue    Onset Date/Surgical Date  01/10/17    Hand Dominance  Right    Prior Therapy  Home health - last year -  few visits only due to lack of insurance       Precautions   Precautions  Other (comment)   No driving and no working      Balance Screen   Has the patient fallen in the past 6 months  No    Has the patient had a decrease in activity level because of a fear of falling?   No    Is the patient reluctant to leave their home because of a fear of falling?   No      Home Environment  Living Environment  Private residence    Type of Home  Apartment    Home Layout  Two level    Home Equipment  Grab bars - tub/shower      Prior Function   Level of Independence  Needs assistance with ADLs   needs assistance with set up for dressing   Vocation  Unemployed    Vocation Requirements  pt was a landscraper prior to injury      ROM / Strength   AROM / PROM / Strength  Strength;AROM      AROM   Overall AROM   Within functional limits for tasks performed    AROM Assessment Site  Hip;Knee;Ankle      Strength   Overall Strength  Within functional limits for tasks performed    Overall Strength Comments  Pt had difficulty comprehending directions for manual muscle test due to impaired cognition    Strength Assessment Site  Hip;Knee;Ankle      Transfers   Transfers  Sit to Stand    Number of Reps  Other reps (comment)   2   Comments   able to perform without UE support from mat      Ambulation/Gait   Ambulation/Gait  Yes    Ambulation/Gait Assistance  5: Supervision    Ambulation/Gait Assistance Details  LLE is ataxic; sister reports pt's gait varies - states he has much difficulty with ambulation at times in the home    Ambulation Distance (Feet)  100 Feet    Assistive device  None    Gait Pattern  Ataxic;Wide base of support   LLE   Ambulation Surface  Level;Indoor    Gait velocity  9.38 secs = 3.35 ft/sec    Stairs  Yes    Stairs Assistance  5: Supervision    Stair Management Technique  One rail Left   to simulate set up at home   Number of Stairs  4    Height of Stairs  6      Standardized Balance Assessment   Standardized Balance Assessment  Timed Up and Go Test      Timed Up and Go Test   Normal TUG (seconds)  13.58   no device               Objective measurements completed on examination: See above findings.              PT Education - 10/19/18 1917    Education Details  Results of PT eval discussed with pt and sister; discussed recommendation for OT (and possibly ST); sister agrees with OT referral    Person(s) Educated  Patient;Caregiver(s)    Methods  Explanation    Comprehension  Verbalized understanding          PT Long Term Goals - 10/19/18 1935      PT LONG TERM GOAL #1   Title  Pt will increase DGI score by at least 5 points to demo improved gait.    Time  4    Period  Weeks    Status  New    Target Date  11/19/18      PT LONG TERM GOAL #2   Title  Increase distance in 6" walk test by at least 100' for increased endurance and gait speed.    Time  4    Period  Weeks    Status  New    Target Date  11/19/18      PT LONG  TERM GOAL #3   Title  Pt will negotiate steps with 1 rail only using a step over step sequence; sister will report pt negotiating steps at home in this manner (states he currently hugs the rail with both hands due to fear of falling).      Time  4    Period  Weeks    Status  New    Target Date  11/19/18      PT LONG TERM GOAL #4   Title  Increase gait velocity from 3.35 ft/sec to >/= 3.8 ft/sec for increased gait efficiency.     Baseline  9.38 secs = 3.35 ft/sec     Time  4    Period  Weeks    Status  New    Target Date  11/19/18      PT LONG TERM GOAL #5   Title  Improve TUG score from 13.58 secs to </= 11 secs for improved functional mobility.      Baseline  16.54, 13.58 secs    Time  4    Period  Weeks    Status  New    Target Date  11/19/18      Additional Long Term Goals   Additional Long Term Goals  Yes      PT LONG TERM GOAL #6   Title  Amb. 300' outside on paved uneven surface and on grassy terrain with supervision without LOB.    Time  4    Period  Weeks    Status  New    Target Date  11/19/18      PT LONG TERM GOAL #7   Title  Perform HEP with caregiver assistance prn due to cognitive deficits.    Time  4    Period  Weeks    Status  New    Target Date  11/19/18             Plan - 10/19/18 1922    Clinical Impression Statement  Pt is a 52 yr old gentleman with TBI due to SDH due to fall sustained on 01-10-17; pt underwent Rt craniotomy (Rt craniectomy for hematoma) on 01-13-17 and then had cranioplasty with placement of bone flap from abdomen to skull on 03-19-17.  Pt presents with gait deviations with ataxic gait pattern with LLE more impaired than RLE.  Pt also presents with standing balance deficits and decreased coordination.  Pt's sister reports significant cognitive deficits with decreased short term memory and poor initiation.  Pt will benefit from PT to address the above stated deficits.    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 due to fall on 01-10-17    Clinical Decision Making  Moderate    Rehab Potential  Good    Clinical Impairments Affecting Rehab Potential  language barrier and cognitive deficits - pt's sister [name] interpreted at eval    PT Frequency  2x / week    PT Duration  4 weeks    PT Treatment/Interventions  ADLs/Self Care Home Management;Aquatic Therapy;Gait training;Stair training;Therapeutic activities;Therapeutic exercise;Balance training;Neuromuscular re-education;Patient/family education    PT  Next Visit Plan  instruct in balance HEP - needs coordination exercises also;  do DGI, 6" walk test if time allows - if not, please do next session    PT Home Exercise Plan  balance and coordination exercises     Recommended Other Services  OT referral requested     Consulted and Agree with Plan of Care  Patient;Family member/caregiver    Family Member Consulted  sister [name]       Patient will benefit from skilled therapeutic intervention in order to improve the following deficits and impairments:  Abnormal gait, Decreased activity tolerance, Decreased balance, Decreased cognition, Decreased coordination, Decreased strength  Visit Diagnosis: Other abnormalities of gait and mobility - Plan: PT plan of care cert/re-cert  Unsteadiness on feet - Plan: PT plan of care cert/re-cert  Other lack of coordination - Plan: PT plan of care cert/re-cert     Problem List Patient Active Problem List   Diagnosis Date Noted  . Hypothyroidism 09/21/2018  . Seizure disorder (HCC) 06/16/2018  . Low testosterone 06/16/2018  . Traumatic brain injury with loss of consciousness (HCC) 10/29/2017  . Anemia 10/29/2017  . Positive depression screening 10/29/2017  . Attention and concentration deficit   . Type 2 diabetes mellitus with peripheral neuropathy (HCC)   . S/P craniotomy 03/19/2017  . Essential  hypertension   . Dysphagia, oropharyngeal 02/01/2017  . Diabetes type 2, uncontrolled (HCC) 06/13/2015    Payson Crumby, Donavan BurnetLinda Suzanne, PT 10/19/2018, 7:52 PM  [facility] [address] [place], [place], [address] Phone: [phone]   Fax:  [phone]  Name: Jamie BeckwithMiguel Garcia Berg MRN: [id number] Date of Birth: 29-Dec-1965

## 2018-10-24 ENCOUNTER — Ambulatory Visit: Payer: Self-pay | Admitting: Physical Therapy

## 2018-10-24 ENCOUNTER — Encounter: Payer: Self-pay | Admitting: Physical Therapy

## 2018-10-24 DIAGNOSIS — R278 Other lack of coordination: Secondary | ICD-10-CM

## 2018-10-24 DIAGNOSIS — R2681 Unsteadiness on feet: Secondary | ICD-10-CM

## 2018-10-24 DIAGNOSIS — R2689 Other abnormalities of gait and mobility: Secondary | ICD-10-CM

## 2018-10-24 NOTE — Patient Instructions (Addendum)
Access Code: [email or web address]  URL: [email or web address]  Date: 10/24/2018  Prepared by: Modena Morrowenise Kiyani Jernigan   Exercises  Step Taps on High Step - 15 reps - 1x daily - 7x weekly  Forward Step Up - 15 reps - 1x daily - 7x weekly  Step-Over - 15 reps - 1x daily - 7x weekly  Single Leg Stance - 15 reps - 1x daily - 7x weekly  Backward Walking with Counter Support - 10 reps - 3 sets - 1x daily - 7x weekly

## 2018-10-24 NOTE — Therapy (Signed)
[facility] [address] [place], [place], [address] Phone: [phone]   Fax:  [phone]  Physical Therapy Treatment  Patient Details  Name: Jamie BeckwithMiguel Garcia Cardenas MRN: [id number] Date of Birth: 19-Oct-1966 Referring Provider (PT): Dr. Faith RogueZachary Swartz   Encounter Date: 10/24/2018  PT End of Session - 10/24/18 0904    Visit Number  2    Number of Visits  9    Date for PT Re-Evaluation  11/18/18    Authorization Type  [facility] [employer] 08-01-18 - 01-03-19    Authorization Time Period  08-01-18 - 01-03-19    PT Start Time  0805    PT Stop Time  0847    PT Time Calculation (min)  42 min    Activity Tolerance  Patient tolerated treatment well    Behavior During Therapy  [facility] for tasks assessed/performed       Past Medical History:  Diagnosis Date  . Diabetes mellitus without complication (HCC)   . Seizures (HCC)   . TBI (traumatic brain injury) (HCC) 03/2017    Past Surgical History:  Procedure Laterality Date  . CRANIOTOMY Right 01/13/2017   Procedure: 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, Placement of bone flap in abdomen;  Surgeon: Coletta MemosKyle Cabbell, MD;  [facility];  Service: Neurosurgery;  Laterality: Right;  . CRANIOTOMY Right 03/19/2017   Procedure: Cranioplasty with placement of bone flap from abdomen;  Surgeon: [name], [name], MD;  [facility];  Service: Neurosurgery;  Laterality: Right;    There were no vitals filed for this visit.  Subjective Assessment - 10/24/18 0901    Subjective  Pt again accompanied by his sister.  Sister reports pt had an episode of confusion when she left him briefly.  Could not find his house and called her constantly in a panic looking for it.  Sister found it in his coat pocket.  Denies falls or changes.    Patient is accompained by:  Family member    Pertinent History  TBI secondary to SDH due to fall on 01-10-17; Rt craniectomy for hematoma on  01-13-17; Cranioplasty with placement of bone flap from abdomen on 03-19-17; pt received HH (few visits per sister's report) and no additional therapies due to lack of insurance at that time;  DM type 2 with peripheral neuropathy;  seizures    Patient Stated Goals  Improve walking and balance    Currently in Pain?  No/denies        Pt session focused on developing HEP.  Performed exercises as written in HEP.  Also attempted taps to targets in floor and taps to cones but pt unable due to decreased coordination and decreased balance.           PT Education - 10/24/18 0903    Education Details  HEP, discussing OT and possibly ST with primary PT    Person(s) Educated  Patient;Caregiver(s)   Sister   Methods  Explanation;Demonstration    Comprehension  Verbalized understanding;Returned demonstration;Need further instruction          PT Long Term Goals - 10/19/18 1935      PT LONG TERM GOAL #1   Title  Pt will increase DGI score by at least 5 points to demo improved gait.    Time  4    Period  Weeks    Status  New    Target Date  11/19/18      PT LONG TERM GOAL #2  Title  Increase distance in 6" walk test by at least 100' for increased endurance and gait speed.    Time  4    Period  Weeks    Status  New    Target Date  11/19/18      PT LONG TERM GOAL #3   Title  Pt will negotiate steps with 1 rail only using a step over step sequence; sister will report pt negotiating steps at home in this manner (states he currently hugs the rail with both hands due to fear of falling).     Time  4    Period  Weeks    Status  New    Target Date  11/19/18      PT LONG TERM GOAL #4   Title  Increase gait velocity from 3.35 ft/sec to >/= 3.8 ft/sec for increased gait efficiency.     Baseline  9.38 secs = 3.35 ft/sec     Time  4    Period  Weeks    Status  New    Target Date  11/19/18      PT LONG TERM GOAL #5   Title  Improve TUG score from 13.58 secs to </= 11 secs for improved  functional mobility.      Baseline  16.54, 13.58 secs    Time  4    Period  Weeks    Status  New    Target Date  11/19/18      Additional Long Term Goals   Additional Long Term Goals  Yes      PT LONG TERM GOAL #6   Title  Amb. 300' outside on paved uneven surface and on grassy terrain with supervision without LOB.    Time  4    Period  Weeks    Status  New    Target Date  11/19/18      PT LONG TERM GOAL #7   Title  Perform HEP with caregiver assistance prn due to cognitive deficits.    Time  4    Period  Weeks    Status  New    Target Date  11/19/18            Plan - 10/24/18 0904    Clinical Impression Statement  Skilled session focused on developing HEP and balance/coordination activities.  Provided sister with HEP as in pt instructions.  Pt needed seated rest breaks duirng session at times but worked well during session.  Reponded well to verbal cues today.  Contnue PT per POC.    Rehab Potential  Good    Clinical Impairments Affecting Rehab Potential  language barrier and cognitive deficits - pt's sister [name] interpreted at eval    PT Frequency  2x / week    PT Duration  4 weeks    PT Treatment/Interventions  ADLs/Self Care Home Management;Aquatic Therapy;Gait training;Stair training;Therapeutic activities;Therapeutic exercise;Balance training;Neuromuscular re-education;Patient/family education    PT Next Visit Plan  Review HEP as provided this session.  Follow up on OT referral.  Do DGI, 6" walk test    PT Home Exercise Plan   [email or web address]    Consulted and Agree with Plan of Care  Patient;Family member/caregiver    Family Member Consulted  sister [name]       Patient will benefit from skilled therapeutic intervention in order to improve the following deficits and impairments:  Abnormal gait, Decreased activity tolerance, Decreased balance, Decreased cognition, Decreased coordination, Decreased strength  Visit Diagnosis: Other abnormalities  of gait and  mobility  Unsteadiness on feet  Other lack of coordination     Problem List Patient Active Problem List   Diagnosis Date Noted  . Hypothyroidism 09/21/2018  . Seizure disorder (HCC) 06/16/2018  . Low testosterone 06/16/2018  . Traumatic brain injury with loss of consciousness (HCC) 10/29/2017  . Anemia 10/29/2017  . Positive depression screening 10/29/2017  . Attention and concentration deficit   . Type 2 diabetes mellitus with peripheral neuropathy (HCC)   . S/P craniotomy 03/19/2017  . Essential hypertension   . Dysphagia, oropharyngeal 02/01/2017  . Diabetes type 2, uncontrolled (HCC) 06/13/2015    Newell Coral, PTA [facility] 10/24/18 9:10 AM Phone: [phone] Fax: [phone]   [facility] [address] [place], [place], [address] Phone: [phone]   Fax:  [phone]  Name: Stellan Vick MRN: [id number] Date of Birth: November 14, 1965

## 2018-10-27 ENCOUNTER — Telehealth: Payer: Self-pay | Admitting: Physical Therapy

## 2018-10-27 ENCOUNTER — Encounter

## 2018-10-27 ENCOUNTER — Ambulatory Visit: Payer: Self-pay | Admitting: Physical Therapy

## 2018-10-27 DIAGNOSIS — R2689 Other abnormalities of gait and mobility: Secondary | ICD-10-CM

## 2018-10-27 DIAGNOSIS — R2681 Unsteadiness on feet: Secondary | ICD-10-CM

## 2018-10-27 DIAGNOSIS — R278 Other lack of coordination: Secondary | ICD-10-CM

## 2018-10-27 NOTE — Therapy (Signed)
[facility] [address] [place], [place], [address] Phone: [phone]   Fax:  [phone]  Physical Therapy Treatment  Patient Details  Name: [name] MRN: [id number] Date of Birth: 09/28/1966 Referring Provider (PT): Dr. Faith RogueZachary Swartz   Encounter Date: 10/27/2018  PT End of Session - 10/27/18 1248    Visit Number  3    Number of Visits  9    Date for PT Re-Evaluation  11/18/18    Authorization Type  [facility] 08-01-18 - 01-03-19    Authorization Time Period  08-01-18 - 01-03-19    PT Start Time  0803    PT Stop Time  0847    PT Time Calculation (min)  44 min    Equipment Utilized During Treatment  Gait belt       Past Medical History:  Diagnosis Date  . Diabetes mellitus without complication (HCC)   . Seizures (HCC)   . TBI (traumatic brain injury) (HCC) 03/2017    Past Surgical History:  Procedure Laterality Date  . CRANIOTOMY Right 01/13/2017   Procedure: 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, Placement of bone flap in abdomen;  Surgeon: Coletta MemosKyle Cabbell, MD;  [facility];  Service: Neurosurgery;  Laterality: Right;  . CRANIOTOMY Right 03/19/2017   Procedure: Cranioplasty with placement of bone flap from abdomen;  Surgeon: [name], [name], MD;  [facility];  Service: Neurosurgery;  Laterality: Right;    There were no vitals filed for this visit.  Subjective Assessment - 10/27/18 1239    Subjective  Pt's sister accompanying pt to PT; she reports pt has been doing exercises at home; she also states that he is [facility] with use of only 1 hand rail and using reciprocal sequence, but at home he continues to use 1 rail and pulls himself up with both hands using a step by step sequence    Patient is accompained by:  Family member    Pertinent History  TBI secondary to SDH due to fall on 01-10-17; Rt craniectomy for hematoma on  01-13-17; Cranioplasty with placement of bone flap from abdomen on 03-19-17; pt received HH (few visits per sister's report) and no additional therapies due to lack of insurance at that time;  DM type 2 with peripheral neuropathy;  seizures    Limitations  Other (comment)    Patient Stated Goals  Improve walking and balance    Currently in Pain?  No/denies                  Verbally reviewed HEP given on 10-20-18 with pt and his sister - sister reports no questions or problems with any of these exs.     [facility] Adult PT Treatment/Exercise - 10/27/18 0820      Ambulation/Gait   Stairs  Yes    Stairs Assistance  5: Supervision    Stair Management Technique  One rail Left;Alternating pattern;Forwards    Number of Stairs  4   performed 4 reps   Height of Stairs  6      Therapeutic Activites    Therapeutic Activities  Other Therapeutic Activities    Other Therapeutic Activities  Pt performed jogging 50' x 6 reps with rest break after each 50' rep - SBA and cues to increase speed          Balance Exercises - 10/27/18 1302      Balance Exercises: Standing  Rockerboard  Anterior/posterior;10 reps;Intermittent UE support   min assist without UE support inside // bars   Sidestepping  1 rep   no UE support   Other Standing Exercises  Pt instructed in additional exercises for HEP - forward, back and side kicks with knee extended 10 reps each; marching in place; sidestepping , crossovers front, back and then braiding for increased coordination;         Pt performed amb. On tip toes forward, backward inside // bars 6 reps without UE support  Pt performed touching balance bubbles (3) with each foot - with UE support on counter prn for improved SLS and coordination  Stepping over and back of orange hurdle - 10 reps each foot without UE support       PT Education - 10/27/18 1246    Education Details  added forward, back and side kicks to HEP;  also added braiding to HEP and  recommended to pt that he practice step negotiation at home using 1 rail only    Person(s) Educated  Patient;Caregiver(s)   sister - Jamie Rodriguezdriana   Methods  Explanation;Demonstration;Handout    Comprehension  Verbalized understanding;Returned demonstration          PT Long Term Goals - 10/19/18 1935      PT LONG TERM GOAL #1   Title  Pt will increase DGI score by at least 5 points to demo improved gait.    Time  4    Period  Weeks    Status  New    Target Date  11/19/18      PT LONG TERM GOAL #2   Title  Increase distance in 6" walk test by at least 100' for increased endurance and gait speed.    Time  4    Period  Weeks    Status  New    Target Date  11/19/18      PT LONG TERM GOAL #3   Title  Pt will negotiate steps with 1 rail only using a step over step sequence; sister will report pt negotiating steps at home in this manner (states he currently hugs the rail with both hands due to fear of falling).     Time  4    Period  Weeks    Status  New    Target Date  11/19/18      PT LONG TERM GOAL #4   Title  Increase gait velocity from 3.35 ft/sec to >/= 3.8 ft/sec for increased gait efficiency.     Baseline  9.38 secs = 3.35 ft/sec     Time  4    Period  Weeks    Status  New    Target Date  11/19/18      PT LONG TERM GOAL #5   Title  Improve TUG score from 13.58 secs to </= 11 secs for improved functional mobility.      Baseline  16.54, 13.58 secs    Time  4    Period  Weeks    Status  New    Target Date  11/19/18      Additional Long Term Goals   Additional Long Term Goals  Yes      PT LONG TERM GOAL #6   Title  Amb. 300' outside on paved uneven surface and on grassy terrain with supervision without LOB.    Time  4    Period  Weeks    Status  New    Target Date  11/19/18      PT LONG TERM GOAL #7   Title  Perform HEP with caregiver assistance prn due to cognitive deficits.    Time  4    Period  Weeks    Status  New    Target Date  11/19/18             Plan - 10/27/18 1248    Clinical Impression Statement  Pt demonstrated ability to perform braiding with demonstrational & tactile cues for sequence and also did welll with jogging in clinic for distances of 50' prior to need for short standing rest break;  step negotiation is improving per pt's sister's report (at [facility], but not so much at home as of this time).  Pt had difficulty keeping knee straight during hip kicks but improved with tactile cues.      Rehab Potential  Good    Clinical Impairments Affecting Rehab Potential  language barrier and cognitive deficits - pt's sister [name] interpreted at eval    PT Frequency  2x / week    PT Duration  4 weeks    PT Treatment/Interventions  ADLs/Self Care Home Management;Aquatic Therapy;Gait training;Stair training;Therapeutic activities;Therapeutic exercise;Balance training;Neuromuscular re-education;Patient/family education    PT Next Visit Plan  Do DGI, 6" walk test; review HEP as needed    PT Home Exercise Plan   [id number]; added side, forward and back kicks and braiding from [facility] on 10-27-18    Consulted and Agree with Plan of Care  Patient;Family member/caregiver    Family Member Consulted  sister [name]       Patient will benefit from skilled therapeutic intervention in order to improve the following deficits and impairments:  Abnormal gait, Decreased activity tolerance, Decreased balance, Decreased cognition, Decreased coordination, Decreased strength  Visit Diagnosis: Other abnormalities of gait and mobility  Unsteadiness on feet  Other lack of coordination     Problem List Patient Active Problem List   Diagnosis Date Noted  . Hypothyroidism 09/21/2018  . Seizure disorder (HCC) 06/16/2018  . Low testosterone 06/16/2018  . Traumatic brain injury with loss of consciousness (HCC) 10/29/2017  . Anemia 10/29/2017  . Positive depression screening 10/29/2017  . Attention and concentration deficit   . Type 2 diabetes  mellitus with peripheral neuropathy (HCC)   . S/P craniotomy 03/19/2017  . Essential hypertension   . Dysphagia, oropharyngeal 02/01/2017  . Diabetes type 2, uncontrolled (HCC) 06/13/2015    Undra Trembath, Donavan Burnet, PT 10/27/2018, 1:03 PM  [facility] [address] [place], [place], [address] Phone: [phone]   Fax:  [phone]  Name: [name] MRN: [id number] Date of Birth: January 03, 1966

## 2018-10-27 NOTE — Patient Instructions (Addendum)
Hip Abduction / Adduction    Separe y luego junte las piernas. Mantenga las rodillas rectas. Repita _10___ veces/sesin. Haga __1__ sesiones/semana. Variacin: Cruce las piernas adelante y atrs haciendo el movimiento de tijeras.   Hip Flexion / Extension: High Kick    Levante la pierna derecha hacia adelante, lo ms alto posible, con la rodilla recta y el pie Mal[place]acia arriba. Guiando con el tobillo lleve la pierna hacia abajo y Leroyhacia atrs. Repita _10__ veces/sesin. Haga __1Braiding    Move to side: 1) cross right leg in front of left, 2) bring back leg out to side, then 3) cross right leg behind left, 4) bring left leg out to side. Continue sequence in same direction. Reverse sequence, moving in opposite direction. Repeat sequence __5 __ times per session. Do _1__ sessions per day.  Do along counter for support    PRACTICE THE STAIRS WITH USING 1 RAIL ONLY

## 2018-10-27 NOTE — Telephone Encounter (Signed)
Dr. Riley KillSwartz,  GlencoeMiguel Cardenas ([birth year]) is currently receiving OP PT.  I feel he would benefit from a referral for OT to address his cognitive deficits and dependencies with ADL's.  (His sister agrees and requests this referral).  If you agree, would you please place an order in Epic for OT and we will get him scheduled.  Thank you so much, Kerry FortSuzanne Amalio Loe, PT

## 2018-11-01 ENCOUNTER — Encounter: Payer: Self-pay | Admitting: Physical Therapy

## 2018-11-01 ENCOUNTER — Ambulatory Visit: Payer: Self-pay | Admitting: Physical Therapy

## 2018-11-01 DIAGNOSIS — R2681 Unsteadiness on feet: Secondary | ICD-10-CM

## 2018-11-01 DIAGNOSIS — R278 Other lack of coordination: Secondary | ICD-10-CM

## 2018-11-01 DIAGNOSIS — R2689 Other abnormalities of gait and mobility: Secondary | ICD-10-CM

## 2018-11-01 NOTE — Therapy (Signed)
[facility] [address] [place], [place], [address] Phone: [phone]   Fax:  [phone]  Physical Therapy Treatment  Patient Details  Name: [name] MRN: [id number] Date of Birth: 02/20/1966 Referring Provider (PT): Dr. Faith Rogue   Encounter Date: 11/01/2018  PT End of Session - 11/01/18 1057    Visit Number  4    Number of Visits  9    Date for PT Re-Evaluation  11/18/18    Authorization Type  [facility] 08-01-18 - 01-03-19    Authorization Time Period  08-01-18 - 01-03-19    PT Start Time  0803    PT Stop Time  0845    PT Time Calculation (min)  42 min    Equipment Utilized During Treatment  Gait belt    Activity Tolerance  Patient tolerated treatment well    Behavior During Therapy  [facility] for tasks assessed/performed       Past Medical History:  Diagnosis Date  . Diabetes mellitus without complication (HCC)   . Seizures (HCC)   . TBI (traumatic brain injury) (HCC) 03/2017    Past Surgical History:  Procedure Laterality Date  . CRANIOTOMY Right 01/13/2017   Procedure: 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, Placement of bone flap in abdomen;  Surgeon: Coletta Memos, MD;  Location: [facility] OR;  Service: Neurosurgery;  Laterality: Right;  . CRANIOTOMY Right 03/19/2017   Procedure: Cranioplasty with placement of bone flap from abdomen;  Surgeon: Tia Alert, MD;  [facility] OR;  Service: Neurosurgery;  Laterality: Right;    There were no vitals filed for this visit.  Subjective Assessment - 11/01/18 0807    Subjective  Denies falls or changes.  Still going to [facility].  Standing hip extension is hard for patient-cant keep his knee straight.    Patient is accompained by:  Family member   sister   Pertinent History  TBI secondary to SDH due to fall on 01-10-17; Rt craniectomy for hematoma on 01-13-17; Cranioplasty with placement of bone flap from abdomen on 03-19-17; pt  received HH (few visits per sister's report) and no additional therapies due to lack of insurance at that time;  DM type 2 with peripheral neuropathy;  seizures    Patient Stated Goals  Improve walking and balance    Currently in Pain?  No/denies         [facility] PT Assessment - 11/01/18 0001      6 Minute Walk- Baseline   6 Minute Walk- Baseline  yes      6 minute walk test results    Aerobic Endurance Distance Walked  1066    Endurance additional comments  Wide base of support.  Did not require any standing rest breaks.                   [facility] Adult PT Treatment/Exercise - 11/01/18 0001      Transfers   Transfers  Sit to Stand    Number of Reps  Other reps (comment)    Comments  Repeated 10 x with R foot on 4" step then 10x with 6" step-to promote LLE weight bearing and weight shift.      Ambulation/Gait   Ambulation/Gait  Yes    Ambulation/Gait Assistance  5: Supervision    Ambulation Distance (Feet)  500 Feet   plus 6 MWT and treadmill   Assistive device  None    Gait Pattern  Ataxic;Wide base of  support;Decreased weight shift to left;Decreased stance time - left;Step-through pattern;Decreased step length - left   bil LEs externally rotated   Ambulation Surface  Level;Indoor    Stairs  Yes    Stairs Assistance  5: Supervision    Stair Management Technique  One rail Left;Alternating pattern;Forwards;Step to pattern   pt varies between step to and alternating on diff attempts   Number of Stairs  8    Height of Stairs  6    Gait Comments  Treadmill x 5 minutes at 1.7 mph with bil UE support working on foot clearance, decreasing wide stance, increasing L step length and increasing L stance time.  Pt needs constant cues to maintain a more normal pattern as he reverts back to abnormal gait pattern.  Worked on same on level ground.        Dynamic Gait Index   Level Surface  Moderate Impairment    Change in Gait Speed  Mild Impairment    Gait with Horizontal Head  Turns  Moderate Impairment    Gait with Vertical Head Turns  Moderate Impairment    Gait and Pivot Turn  Normal    Step Over Obstacle  Mild Impairment    Step Around Obstacles  Mild Impairment    Steps  Moderate Impairment    Total Score  13             PT Education - 11/01/18 1055    Education Details  Encouraging normal step length and base of support when ambulating in community/home.    Person(s) Educated  Patient;Caregiver(s)   sister-[name]   Methods  Explanation;Demonstration;Verbal cues    Comprehension  Verbalized understanding;Need further instruction   pt needs further instruction         PT Long Term Goals - 10/19/18 1935      PT LONG TERM GOAL #1   Title  Pt will increase DGI score by at least 5 points to demo improved gait.    Time  4    Period  Weeks    Status  New    Target Date  11/19/18      PT LONG TERM GOAL #2   Title  Increase distance in 6" walk test by at least 100' for increased endurance and gait speed.    Time  4    Period  Weeks    Status  New    Target Date  11/19/18      PT LONG TERM GOAL #3   Title  Pt will negotiate steps with 1 rail only using a step over step sequence; sister will report pt negotiating steps at home in this manner (states he currently hugs the rail with both hands due to fear of falling).     Time  4    Period  Weeks    Status  New    Target Date  11/19/18      PT LONG TERM GOAL #4   Title  Increase gait velocity from 3.35 ft/sec to >/= 3.8 ft/sec for increased gait efficiency.     Baseline  9.38 secs = 3.35 ft/sec     Time  4    Period  Weeks    Status  New    Target Date  11/19/18      PT LONG TERM GOAL #5   Title  Improve TUG score from 13.58 secs to </= 11 secs for improved functional mobility.      Baseline  16.54, 13.58  secs    Time  4    Period  Weeks    Status  New    Target Date  11/19/18      Additional Long Term Goals   Additional Long Term Goals  Yes      PT LONG TERM GOAL #6   Title   Amb. 300' outside on paved uneven surface and on grassy terrain with supervision without LOB.    Time  4    Period  Weeks    Status  New    Target Date  11/19/18      PT LONG TERM GOAL #7   Title  Perform HEP with caregiver assistance prn due to cognitive deficits.    Time  4    Period  Weeks    Status  New    Target Date  11/19/18            Plan - 11/01/18 1057    Clinical Impression Statement  Pt scored 13/24 on DGI during session.  Continues with gait deviations as noted in treatment section.  Continue PT per POC.    Rehab Potential  Good    Clinical Impairments Affecting Rehab Potential  language barrier and cognitive deficits - pt's sister [name] interpreted at eval    PT Frequency  2x / week    PT Duration  4 weeks    PT Treatment/Interventions  ADLs/Self Care Home Management;Aquatic Therapy;Gait training;Stair training;Therapeutic activities;Therapeutic exercise;Balance training;Neuromuscular re-education;Patient/family education    PT Next Visit Plan  Continue gait working on technique.  Try gait trainer on treadmill.  High level balance activities.    PT Home Exercise Plan   [id number]; added side, forward and back kicks and [facility] 10-27-18    Consulted and Agree with Plan of Care  Patient;Family member/caregiver    Family Member Consulted  sister [name]       Patient will benefit from skilled therapeutic intervention in order to improve the following deficits and impairments:  Abnormal gait, Decreased activity tolerance, Decreased balance, Decreased cognition, Decreased coordination, Decreased strength  Visit Diagnosis: Other abnormalities of gait and mobility  Unsteadiness on feet  Other lack of coordination     Problem List Patient Active Problem List   Diagnosis Date Noted  . Hypothyroidism 09/21/2018  . Seizure disorder (HCC) 06/16/2018  . Low testosterone 06/16/2018  . Traumatic brain injury with loss of consciousness (HCC) 10/29/2017   . Anemia 10/29/2017  . Positive depression screening 10/29/2017  . Attention and concentration deficit   . Type 2 diabetes mellitus with peripheral neuropathy (HCC)   . S/P craniotomy 03/19/2017  . Essential hypertension   . Dysphagia, oropharyngeal 02/01/2017  . Diabetes type 2, uncontrolled (HCC) 06/13/2015    Newell Coralenise Terry Cherry [place], [place] [facility] 11/01/18 11:01 AM Phone: [phone] Fax: [phone]   [facility] [address] [place], [place], [address] Phone: [phone]   Fax:  [phone]  Name: Jamie BeckwithMiguel Garcia Berg MRN: [id number] Date of Birth: 1966/01/25

## 2018-11-04 ENCOUNTER — Ambulatory Visit: Payer: Self-pay | Attending: Physical Medicine & Rehabilitation | Admitting: Physical Therapy

## 2018-11-04 DIAGNOSIS — R2689 Other abnormalities of gait and mobility: Secondary | ICD-10-CM | POA: Insufficient documentation

## 2018-11-04 DIAGNOSIS — M6281 Muscle weakness (generalized): Secondary | ICD-10-CM | POA: Insufficient documentation

## 2018-11-04 DIAGNOSIS — R2681 Unsteadiness on feet: Secondary | ICD-10-CM | POA: Insufficient documentation

## 2018-11-04 DIAGNOSIS — R41840 Attention and concentration deficit: Secondary | ICD-10-CM | POA: Insufficient documentation

## 2018-11-04 DIAGNOSIS — R278 Other lack of coordination: Secondary | ICD-10-CM | POA: Insufficient documentation

## 2018-11-04 DIAGNOSIS — R41844 Frontal lobe and executive function deficit: Secondary | ICD-10-CM | POA: Insufficient documentation

## 2018-11-04 NOTE — Therapy (Signed)
[facility] [address] [place], [place], [address] Phone: [phone]   Fax:  [phone]  Physical Therapy Treatment  Patient Details  Name: [name] MRN: [id number] Date of Birth: 1966/07/30 Referring Provider (PT): Dr. Faith Rogue   Encounter Date: 11/04/2018  PT End of Session - 11/04/18 1212    Visit Number  5    Number of Visits  9    Date for PT Re-Evaluation  11/18/18    Authorization Type  [facility] [employer] 08-01-18 - 01-03-19    Authorization Time Period  08-01-18 - 01-03-19    PT Start Time  0850    PT Stop Time  0930    PT Time Calculation (min)  40 min    Equipment Utilized During Treatment  Gait belt    Activity Tolerance  Patient tolerated treatment well    Behavior During Therapy  [facility] for tasks assessed/performed       Past Medical History:  Diagnosis Date  . Diabetes mellitus without complication (HCC)   . Seizures (HCC)   . TBI (traumatic brain injury) (HCC) 03/2017    Past Surgical History:  Procedure Laterality Date  . CRANIOTOMY Right 01/13/2017   Procedure: 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, Placement of bone flap in abdomen;  Surgeon: Coletta Memos, MD;  Location: [facility] OR;  Service: Neurosurgery;  Laterality: Right;  . CRANIOTOMY Right 03/19/2017   Procedure: Cranioplasty with placement of bone flap from abdomen;  Surgeon: Tia Alert, MD;  Location: [facility] OR;  Service: Neurosurgery;  Laterality: Right;    There were no vitals filed for this visit.  Subjective Assessment - 11/04/18 0852    Subjective  Denies falls or changes.  Sister has been taking him to [facility] and walking on track.  She feels like he is improving in his mobility and expresses appreciation for his therapy sessions.      Patient is accompained by:  Family member;Interpreter   sister, niece and nephew   Pertinent History  TBI secondary to SDH due to fall on 01-10-17; Rt craniectomy  for hematoma on 01-13-17; Cranioplasty with placement of bone flap from abdomen on 03-19-17; pt received HH (few visits per sister's report) and no additional therapies due to lack of insurance at that time;  DM type 2 with peripheral neuropathy;  seizures    Limitations  Other (comment)    Patient Stated Goals  Improve walking and balance    Currently in Pain?  No/denies         [facility] Adult PT Treatment/Exercise - 11/04/18 0001      Ambulation/Gait   Ambulation/Gait  Yes    Ambulation/Gait Assistance  5: Supervision    Ambulation/Gait Assistance Details  cues to increase speed and decrease BOS with gait and jogging.  Poor UE control with jogging and questionable tone present in LUE with increased effort.    Ambulation Distance (Feet)  330 Feet   x 1, 100' x 1, jogging x 50' x 20 plus reps   Assistive device  None    Gait Pattern  Ataxic;Wide base of support;Decreased weight shift to left;Decreased stance time - left;Step-through pattern;Decreased step length - left    Ambulation Surface  Level;Indoor    Gait Comments  Forward and backward gait in parallel bars using mirror for cues to decrease BOS and decrease LE external rotation.  No UE support with forward but intermittent UE support with backwards.  Stepping over/back foam in floor  working on step length and foot positioning as well.          Balance Exercises - 11/04/18 1202      Balance Exercises: Standing   Step Ups  Forward;4 inch;UE support 1;Intermittent UE support   taps to step alternating LE's then stepping onto/off step   Other Standing Exercises  Bosu in parallel bars with bil then 1 UE support, alternating taps to bubbles on floor and alternating taps to cones working on increased stance time and increased weight shift.  Pt actually with decreased weight shift to right today (vs left on previous trials).               PT Long Term Goals - 10/19/18 1935      PT LONG TERM GOAL #1   Title  Pt will increase DGI  score by at least 5 points to demo improved gait.    Time  4    Period  Weeks    Status  New    Target Date  11/19/18      PT LONG TERM GOAL #2   Title  Increase distance in 6" walk test by at least 100' for increased endurance and gait speed.    Time  4    Period  Weeks    Status  New    Target Date  11/19/18      PT LONG TERM GOAL #3   Title  Pt will negotiate steps with 1 rail only using a step over step sequence; sister will report pt negotiating steps at home in this manner (states he currently hugs the rail with both hands due to fear of falling).     Time  4    Period  Weeks    Status  New    Target Date  11/19/18      PT LONG TERM GOAL #4   Title  Increase gait velocity from 3.35 ft/sec to >/= 3.8 ft/sec for increased gait efficiency.     Baseline  9.38 secs = 3.35 ft/sec     Time  4    Period  Weeks    Status  New    Target Date  11/19/18      PT LONG TERM GOAL #5   Title  Improve TUG score from 13.58 secs to </= 11 secs for improved functional mobility.      Baseline  16.54, 13.58 secs    Time  4    Period  Weeks    Status  New    Target Date  11/19/18      Additional Long Term Goals   Additional Long Term Goals  Yes      PT LONG TERM GOAL #6   Title  Amb. 300' outside on paved uneven surface and on grassy terrain with supervision without LOB.    Time  4    Period  Weeks    Status  New    Target Date  11/19/18      PT LONG TERM GOAL #7   Title  Perform HEP with caregiver assistance prn due to cognitive deficits.    Time  4    Period  Weeks    Status  New    Target Date  11/19/18            Plan - 11/04/18 1213    Clinical Impression Statement  Pt with improvement in gait pattern but still needs frequent cues to maintain.  Pt still appears  fearful of falling with SLS activities and backward walking.  Continue PT per POC.  Awaiting MD response on OT order.      Rehab Potential  Good    Clinical Impairments Affecting Rehab Potential  language  barrier and cognitive deficits - pt's sister [name] interpreted at eval    PT Frequency  2x / week    PT Duration  4 weeks    PT Treatment/Interventions  ADLs/Self Care Home Management;Aquatic Therapy;Gait training;Stair training;Therapeutic activities;Therapeutic exercise;Balance training;Neuromuscular re-education;Patient/family education    PT Next Visit Plan  Continue gait working on technique.  Try gait trainer on treadmill.  High level balance activities.    PT Home Exercise Plan   [id number]; added side, forward and back kicks and braiding from [facility] on 10-27-18    Consulted and Agree with Plan of Care  Patient;Family member/caregiver    Family Member Consulted  sister [name]       Patient will benefit from skilled therapeutic intervention in order to improve the following deficits and impairments:  Abnormal gait, Decreased activity tolerance, Decreased balance, Decreased cognition, Decreased coordination, Decreased strength  Visit Diagnosis: Other abnormalities of gait and mobility  Unsteadiness on feet  Other lack of coordination     Problem List Patient Active Problem List   Diagnosis Date Noted  . Hypothyroidism 09/21/2018  . Seizure disorder (HCC) 06/16/2018  . Low testosterone 06/16/2018  . Traumatic brain injury with loss of consciousness (HCC) 10/29/2017  . Anemia 10/29/2017  . Positive depression screening 10/29/2017  . Attention and concentration deficit   . Type 2 diabetes mellitus with peripheral neuropathy (HCC)   . S/P craniotomy 03/19/2017  . Essential hypertension   . Dysphagia, oropharyngeal 02/01/2017  . Diabetes type 2, uncontrolled (HCC) 06/13/2015    Newell Coral, PTA [facility] 11/04/18 12:15 PM Phone: [phone] Fax: [phone]   [facility] [address] [place], [place], [address] Phone: [phone]   Fax:  [phone]  Name:  [name] MRN: [id number] Date of Birth: [birth year]

## 2018-11-08 ENCOUNTER — Ambulatory Visit: Payer: Self-pay | Admitting: Physical Therapy

## 2018-11-08 DIAGNOSIS — R278 Other lack of coordination: Secondary | ICD-10-CM

## 2018-11-08 DIAGNOSIS — R2681 Unsteadiness on feet: Secondary | ICD-10-CM

## 2018-11-08 DIAGNOSIS — R2689 Other abnormalities of gait and mobility: Secondary | ICD-10-CM

## 2018-11-09 NOTE — Therapy (Signed)
[facility] [address] [place], [place], [address] Phone: [phone]   Fax:  [phone]  Physical Therapy Treatment  Patient Details  Name: [name] MRN: [id number] Date of Birth: 11/19/1965 Referring Provider (PT): Dr. Faith Berg   Encounter Date: 11/08/2018  PT End of Session - 11/09/18 2008    Visit Number  6    Number of Visits  9    Date for PT Re-Evaluation  11/18/18    Authorization Type  [facility] [employer] 08-01-18 - 01-03-19    Authorization Time Period  08-01-18 - 01-03-19    PT Start Time  0920   session started early    PT Stop Time  1015    PT Time Calculation (min)  55 min    Equipment Utilized During Treatment  Gait belt       Past Medical History:  Diagnosis Date  . Diabetes mellitus without complication (HCC)   . Seizures (HCC)   . TBI (traumatic brain injury) (HCC) 03/2017    Past Surgical History:  Procedure Laterality Date  . CRANIOTOMY Right 01/13/2017   Procedure: 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, Placement of bone flap in abdomen;  Surgeon: Jamie Memos, MD;  Location: [facility] OR;  Service: Neurosurgery;  Laterality: Right;  . CRANIOTOMY Right 03/19/2017   Procedure: Cranioplasty with placement of bone flap from abdomen;  Surgeon: Jamie Alert, MD;  Location: [facility] OR;  Service: Neurosurgery;  Laterality: Right;    There were no vitals filed for this visit.  Subjective Assessment - 11/09/18 2001    Subjective  Sister reports pt fell in [employer] while pulling for bag of lemons (on Saturday) - did not get hurt    Patient is accompained by:  Family member;Interpreter    Pertinent History  TBI secondary to SDH due to fall on 01-10-17; Rt craniectomy for hematoma on 01-13-17; Cranioplasty with placement of bone flap from abdomen on 03-19-17; pt received HH (few visits per sister's report) and no additional therapies due to lack of insurance at that time;   DM type 2 with peripheral neuropathy;  seizures    Patient Stated Goals  Improve walking and balance    Currently in Pain?  No/denies                       [facility] Adult PT Treatment/Exercise - 11/09/18 0001      Ambulation/Gait   Ambulation/Gait  Yes    Ambulation/Gait Assistance  5: Supervision    Ambulation/Gait Assistance Details  Pt has slight steppage gait RLE with increased hip and knee flexion in swing phase of gait    Ambulation Distance (Feet)  230 Feet    Assistive device  None    Gait Pattern  Ataxic;Wide base of support;Decreased weight shift to left;Decreased stance time - left;Step-through pattern;Decreased step length - left    Ambulation Surface  Level;Indoor    Stairs  Yes    Stairs Assistance  5: Supervision    Stair Management Technique  One rail Right;Forwards;Step to pattern;Alternating pattern   cues to use alternating pattern   Number of Stairs  4    Height of Stairs  6    Gait Comments  Treadmill x 5" at 1.5 mph with bil. UE support; cues for increased initial heel contact           Balance Exercises - 11/09/18 2005      Balance Exercises: Standing  Standing Eyes Opened  Wide (BOA);Foam/compliant surface;2 reps    Stepping Strategy  Anterior;Lateral;Foam/compliant surface;5 reps    Other Standing Exercises  pt performed coordination exercise - tapping balance bubbles and then cone taps progressing to tipping cone over and standing upright for improved SLS on stance leg - pt performed 5 reps each leg with min hand held assist              PT Long Term Goals - 10/19/18 1935      PT LONG TERM GOAL #1   Title  Pt will increase DGI score by at least 5 points to demo improved gait.    Time  4    Period  Weeks    Status  New    Target Date  11/19/18      PT LONG TERM GOAL #2   Title  Increase distance in 6" walk test by at least 100' for increased endurance and gait speed.    Time  4    Period  Weeks    Status  New    Target  Date  11/19/18      PT LONG TERM GOAL #3   Title  Pt will negotiate steps with 1 rail only using a step over step sequence; sister will report pt negotiating steps at home in this manner (states he currently hugs the rail with both hands due to fear of falling).     Time  4    Period  Weeks    Status  New    Target Date  11/19/18      PT LONG TERM GOAL #4   Title  Increase gait velocity from 3.35 ft/sec to >/= 3.8 ft/sec for increased gait efficiency.     Baseline  9.38 secs = 3.35 ft/sec     Time  4    Period  Weeks    Status  New    Target Date  11/19/18      PT LONG TERM GOAL #5   Title  Improve TUG score from 13.58 secs to </= 11 secs for improved functional mobility.      Baseline  16.54, 13.58 secs    Time  4    Period  Weeks    Status  New    Target Date  11/19/18      Additional Long Term Goals   Additional Long Term Goals  Yes      PT LONG TERM GOAL #6   Title  Amb. 300' outside on paved uneven surface and on grassy terrain with supervision without LOB.    Time  4    Period  Weeks    Status  New    Target Date  11/19/18      PT LONG TERM GOAL #7   Title  Perform HEP with caregiver assistance prn due to cognitive deficits.    Time  4    Period  Weeks    Status  New    Target Date  11/19/18            Plan - 11/09/18 2009    Clinical Impression Statement  Pt's gait is increasing in speed with increased step length noted and increased gait velocity; pt has mild steppage gait/ataxic gait pattern RLE with increased hip and knee flexion noted in swing phase of gait.  Pt is progressing towards goals.      Rehab Potential  Good    Clinical Impairments Affecting Rehab Potential  language  barrier and cognitive deficits - pt's sister [name] interpreted at eval    PT Frequency  2x / week    PT Duration  4 weeks    PT Treatment/Interventions  ADLs/Self Care Home Management;Aquatic Therapy;Gait training;Stair training;Therapeutic activities;Therapeutic  exercise;Balance training;Neuromuscular re-education;Patient/family education    PT Next Visit Plan  Continue gait working on technique.  Try gait trainer on treadmill.  High level balance activities.    PT Home Exercise Plan   [id number]; added side, forward and back [facility]    Consulted and Agree with Plan of Care  Patient;Family member/caregiver    Family Member Consulted  sister [name]       Patient will benefit from skilled therapeutic intervention in order to improve the following deficits and impairments:  Abnormal gait, Decreased activity tolerance, Decreased balance, Decreased cognition, Decreased coordination, Decreased strength  Visit Diagnosis: Other abnormalities of gait and mobility  Unsteadiness on feet  Other lack of coordination     Problem List Patient Active Problem List   Diagnosis Date Noted  . Hypothyroidism 09/21/2018  . Seizure disorder (HCC) 06/16/2018  . Low testosterone 06/16/2018  . Traumatic brain injury with loss of consciousness (HCC) 10/29/2017  . Anemia 10/29/2017  . Positive depression screening 10/29/2017  . Attention and concentration deficit   . Type 2 diabetes mellitus with peripheral neuropathy (HCC)   . S/P craniotomy 03/19/2017  . Essential hypertension   . Dysphagia, oropharyngeal 02/01/2017  . Diabetes type 2, uncontrolled (HCC) 06/13/2015    Jhayden Demuro, Donavan BurnetLinda Suzanne, PT 11/09/2018, 8:14 PM  [facility] [address] [place], [place], [address] Phone: [phone]   Fax:  [phone]  Name: [name] MRN: [id number] Date of Birth: 1966-07-10

## 2018-11-10 ENCOUNTER — Ambulatory Visit: Payer: Self-pay | Admitting: Physical Therapy

## 2018-11-10 ENCOUNTER — Telehealth: Payer: Self-pay | Admitting: Physical Therapy

## 2018-11-10 DIAGNOSIS — S069X9S Unspecified intracranial injury with loss of consciousness of unspecified duration, sequela: Secondary | ICD-10-CM

## 2018-11-10 DIAGNOSIS — R278 Other lack of coordination: Secondary | ICD-10-CM

## 2018-11-10 DIAGNOSIS — R2681 Unsteadiness on feet: Secondary | ICD-10-CM

## 2018-11-10 DIAGNOSIS — R2689 Other abnormalities of gait and mobility: Secondary | ICD-10-CM

## 2018-11-10 NOTE — Telephone Encounter (Signed)
Done. Garth Schlatter!  ZTS

## 2018-11-10 NOTE — Telephone Encounter (Signed)
Dr. Riley Kill,  [name] (January 01, 2066) is currently receiving OP PT.  I feel he would benefit form a referral to OT to address his cognitive deficits and dependencies with ADL's.  (His sister agrees & requests this referral). If you agree, would you please place an order in Epic for OT and we will get him scheduled.    Thank you so much, Kerry Fort, PT

## 2018-11-11 NOTE — Therapy (Signed)
[facility] [address] [place], [place], [address] Phone: [phone]   Fax:  [phone]  Physical Therapy Treatment  Patient Details  Name: Jamie BeckwithMiguel Garcia Berg MRN: [id number] Date of Birth: 04-26-1966 Referring Provider (PT): Dr. Faith RogueZachary Swartz   Encounter Date: 11/10/2018  PT End of Session - 11/11/18 1557    Visit Number  7    Number of Visits  9    Date for PT Re-Evaluation  11/18/18    Authorization Type  [facility] [employer] 08-01-18 - 01-03-19    Authorization Time Period  08-01-18 - 01-03-19    PT Start Time  0847    PT Stop Time  0933    PT Time Calculation (min)  46 min    Equipment Utilized During Treatment  Gait belt       Past Medical History:  Diagnosis Date  . Diabetes mellitus without complication (HCC)   . Seizures (HCC)   . TBI (traumatic brain injury) (HCC) 03/2017    Past Surgical History:  Procedure Laterality Date  . CRANIOTOMY Right 01/13/2017   Procedure: 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, Placement of bone flap in abdomen;  Surgeon: Coletta MemosKyle Cabbell, MD;  [facility];  Service: Neurosurgery;  Laterality: Right;  . CRANIOTOMY Right 03/19/2017   Procedure: Cranioplasty with placement of bone flap from abdomen;  Surgeon: [name], [name], MD;  [facility];  Service: Neurosurgery;  Laterality: Right;    There were no vitals filed for this visit.  Subjective Assessment - 11/11/18 1550    Subjective  Sister reports the therapy is really helping pt (her brother)    Patient is accompained by:  Family member;Interpreter    Pertinent History  TBI secondary to SDH due to fall on 01-10-17; Rt craniectomy for hematoma on 01-13-17; Cranioplasty with placement of bone flap from abdomen on 03-19-17; pt received HH (few visits per sister's report) and no additional therapies due to lack of insurance at that time;  DM type 2 with peripheral neuropathy;  seizures    Limitations   Other (comment)    Patient Stated Goals  Improve walking and balance    Currently in Pain?  No/denies                       [facility]/Exercise - 11/11/18 0001      Ambulation/Gait   Ambulation/Gait  Yes    Ambulation/Gait Assistance  5: Supervision    Ambulation/Gait Assistance Details  cues to increase step length with incr. initial heel contact     Ambulation Distance (Feet)  350 Feet    Assistive device  None    Gait Pattern  Ataxic;Wide base of support;Decreased weight shift to left;Decreased stance time - left;Step-through pattern;Decreased step length - left    Ambulation Surface  Level;Indoor      Therapeutic Activites    Therapeutic Activities  Other Therapeutic Activities    Other Therapeutic Activities  Pt performed kicking soccer ball back to interpreter with RLE/LLE and then progressed to kicking soccer ball 115' around track with CGA; pt able to stop ball with Rt foot and then quickly kick           Balance Exercises - 11/11/18 1556      Balance Exercises: Standing   Standing Eyes Closed  Wide (BOA);Foam/compliant surface;Head turns;3 reps   standing with EC 10 secs, then adding head turns      Pt  performed marching on blue mat for compliant surface training; kicks forward and side 10 reps each leg alternating  Alternate tap downs to floor with standing on blue mat 10 reps each leg  Standing on blue mat - feet together - EC - head turns side to side 10 reps with CGA to min assist  Tall kneeling on blue mat on floor - reaching with each UE in various directions with CGA for balance    Amb. Forward approx. 40' x1 rep on tip toes with CGA Jogging forward 35' x 2 reps with SBA   PT Long Term Goals - 10/19/18 1935      PT LONG TERM GOAL #1   Title  Pt will increase DGI score by at least 5 points to demo improved gait.    Time  4    Period  Weeks    Status  New    Target Date  11/19/18      PT LONG TERM GOAL #2   Title   Increase distance in 6" walk test by at least 100' for increased endurance and gait speed.    Time  4    Period  Weeks    Status  New    Target Date  11/19/18      PT LONG TERM GOAL #3   Title  Pt will negotiate steps with 1 rail only using a step over step sequence; sister will report pt negotiating steps at home in this manner (states he currently hugs the rail with both hands due to fear of falling).     Time  4    Period  Weeks    Status  New    Target Date  11/19/18      PT LONG TERM GOAL #4   Title  Increase gait velocity from 3.35 ft/sec to >/= 3.8 ft/sec for increased gait efficiency.     Baseline  9.38 secs = 3.35 ft/sec     Time  4    Period  Weeks    Status  New    Target Date  11/19/18      PT LONG TERM GOAL #5   Title  Improve TUG score from 13.58 secs to </= 11 secs for improved functional mobility.      Baseline  16.54, 13.58 secs    Time  4    Period  Weeks    Status  New    Target Date  11/19/18      Additional Long Term Goals   Additional Long Term Goals  Yes      PT LONG TERM GOAL #6   Title  Amb. 300' outside on paved uneven surface and on grassy terrain with supervision without LOB.    Time  4    Period  Weeks    Status  New    Target Date  11/19/18      PT LONG TERM GOAL #7   Title  Perform HEP with caregiver assistance prn due to cognitive deficits.    Time  4    Period  Weeks    Status  New    Target Date  11/19/18            Plan - 11/11/18 1558    Clinical Impression Statement  Pt able to simulate playing soccer with ability to kick ball around track, with fast walking, kicking and stopping ball and then kicking, demonstrating improved coordination and balance;  pt's gait improved with cues to  increase initial heel contact RLE and to increase step length                                                               Rehab Potential  Good    Clinical Impairments Affecting Rehab Potential  language barrier and cognitive deficits - pt's  sister [name] interpreted at eval    PT Frequency  2x / week    PT Duration  4 weeks    PT Treatment/Interventions  ADLs/Self Care Home Management;Aquatic Therapy;Gait training;Stair training;Therapeutic activities;Therapeutic exercise;Balance training;Neuromuscular re-education;Patient/family education    PT Next Visit Plan  check goals - plan on renewal next week    PT Home Exercise Plan   [id number]; added side, forward and back [facility]    Consulted and Agree with Plan of Care  Patient;Family member/caregiver    Family Member Consulted  sister [name]       Patient will benefit from skilled therapeutic intervention in order to improve the following deficits and impairments:  Abnormal gait, Decreased activity tolerance, Decreased balance, Decreased cognition, Decreased coordination, Decreased strength  Visit Diagnosis: Other abnormalities of gait and mobility  Other lack of coordination  Unsteadiness on feet     Problem List Patient Active Problem List   Diagnosis Date Noted  . Hypothyroidism 09/21/2018  . Seizure disorder (HCC) 06/16/2018  . Low testosterone 06/16/2018  . Traumatic brain injury with loss of consciousness (HCC) 10/29/2017  . Anemia 10/29/2017  . Positive depression screening 10/29/2017  . Attention and concentration deficit   . Type 2 diabetes mellitus with peripheral neuropathy (HCC)   . S/P craniotomy 03/19/2017  . Essential hypertension   . Dysphagia, oropharyngeal 02/01/2017  . Diabetes type 2, uncontrolled (HCC) 06/13/2015    Carlissa Pesola, Donavan BurnetLinda Suzanne, PT 11/11/2018, 4:05 PM  [facility] [address] [place], [place], [address] Phone: [phone]   Fax:  [phone]  Name: Jamie BeckwithMiguel Garcia Berg MRN: [id number] Date of Birth: 1966/04/16

## 2018-11-14 MED FILL — ?ATORVASTATIN 10 MG TABLET: 10 | 30 days supply | Qty: 30 | Fill #2

## 2018-11-14 MED FILL — LEVETIRACETAM ER 500 MG TAB: 500 | 30 days supply | Qty: 60 | Fill #2

## 2018-11-14 MED FILL — ?METFORMIN HCL 1000MG TABS: 1000 | 30 days supply | Qty: 60 | Fill #6

## 2018-11-15 ENCOUNTER — Ambulatory Visit: Payer: Self-pay | Admitting: Occupational Therapy

## 2018-11-15 ENCOUNTER — Encounter: Payer: Self-pay | Admitting: Occupational Therapy

## 2018-11-15 ENCOUNTER — Ambulatory Visit: Payer: Self-pay | Admitting: Physical Therapy

## 2018-11-15 DIAGNOSIS — R41840 Attention and concentration deficit: Secondary | ICD-10-CM

## 2018-11-15 DIAGNOSIS — R2681 Unsteadiness on feet: Secondary | ICD-10-CM

## 2018-11-15 DIAGNOSIS — R41844 Frontal lobe and executive function deficit: Secondary | ICD-10-CM

## 2018-11-15 DIAGNOSIS — R2689 Other abnormalities of gait and mobility: Secondary | ICD-10-CM

## 2018-11-15 DIAGNOSIS — M6281 Muscle weakness (generalized): Secondary | ICD-10-CM

## 2018-11-15 DIAGNOSIS — R278 Other lack of coordination: Secondary | ICD-10-CM

## 2018-11-15 NOTE — Therapy (Signed)
[facility] [address] [place], [place], [address] Phone: [phone]   Fax:  [phone]  Occupational Therapy Evaluation  Patient Details  Name: [name] MRN: [id number] Date of Birth: [birth year] Referring Provider (OT): Dr. Faith RogueZachary Swartz   Encounter Date: 11/15/2018  OT End of Session - 11/15/18 1132    Visit Number  1    Number of Visits  25    Date for OT Re-Evaluation  02/13/19    Authorization Type  [facility] 08/01/18-01/03/19     OT Start Time  1020    OT Stop Time  1105    OT Time Calculation (min)  45 min    Activity Tolerance  Patient tolerated treatment well    Behavior During Therapy  [facility] for tasks assessed/performed;Flat affect       Past Medical History:  Diagnosis Date  . Diabetes mellitus without complication (HCC)   . Seizures (HCC)   . TBI (traumatic brain injury) (HCC) 03/2017    Past Surgical History:  Procedure Laterality Date  . CRANIOTOMY Right 01/13/2017   Procedure: 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, Placement of bone flap in abdomen;  Surgeon: Coletta MemosKyle Cabbell, MD;  [facility];  Service: Neurosurgery;  Laterality: Right;  . CRANIOTOMY Right 03/19/2017   Procedure: Cranioplasty with placement of bone flap from abdomen;  Surgeon: [name], [name], MD;  [facility];  Service: Neurosurgery;  Laterality: Right;    There were no vitals filed for this visit.  Subjective Assessment - 11/15/18 1128    Subjective   Pt calls LUE "crazy hand"    Patient is accompained by:  Family member;Interpreter   sister   Patient Stated Goals  "to be well"    Currently in Pain?  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 Faith RogueZachary Swartz    Onset Date/Surgical Date  01/10/17    Hand Dominance  Right    Prior Therapy  Home health - last year -  few  visits only due to lack of insurance       Precautions   Precautions  Other (comment);Fall      Balance Screen   Has the patient fallen in the past 6 months  Yes   approx 2 weeks   How many times?  1   in [employer] (fell back getting produce bag)     Home  Environment   Family/patient expects to be discharged to:  Private residence    Additional Comments  sister, brother and law, niece/nephew      Prior Function   Level of Independence  Needs assistance with ADLs   now set-up/cueing for BADLs; Independent prior to injury   Vocation  Unemployed    Vocation Requirements  pt was a Optician, dispensinglandscraper prior to injury    Leisure  wrestling (group), soccer (playing/watching)      ADL   Upper Body Bathing  --   min cues/A for cognition   Lower Body Bathing  --   min A/cues   Upper Body Dressing  --   set up/min cues   Lower Body Dressing  --   set-up/min cues   Toilet Transfer  Modified independent    Toileting - Clothing Manipulation  Modified independent    Toileting -  Hygiene  Modified Independent    Tub/Shower Transfer  Supervision/safety    ADL comments  decr initiation for all ADLs, needs cueing and set-up due to cognitive deficits      IADL   Shopping  Needs to be accompanied on any shopping trip    Light Housekeeping  --   not performing   Prior Level of Function Meal Prep  independent    Meal Prep  --   needs supervision/assist due to cognition   Community Mobility  Relies on family or friends for transportation    Medication Management  --   needs set-up and supervision     Mobility   Mobility Status  Independent    Mobility Status Comments  1 recent fall backwards      Vision - History   Baseline Vision  --   [facility] per pt    Additional Comments  denies changes per pt      Vision Assessment   Comment  Noted missed pegs on L side, however, upon gross assessment of visual fields, appear WNL therefore, may be due to inattention.  Will monitor further in functional  context prn.   sister reports bumping into items occasionally     Cognition   Overall Cognitive Status  Impaired/Different from baseline   difficult to assess due to Spanish speaking   Area of Impairment  Attention;Memory;Problem solving;Awareness;Safety/judgement;Following commands    Difficult to assess due to  Non-English speaking    Current Attention Level  Selective   impaired   Attention Comments  ?L inattention as well    Memory  Decreased short-term memory   per sister   Following Commands  Follows one step commands consistently   difficulty with mult-step commands   Safety/Judgement  Decreased awareness of deficits;Decreased awareness of safety    Problem Solving  Decreased initiation;Difficulty sequencing      Sensation   Additional Comments  pt denies changes      Coordination   Gross Motor Movements are Fluid and Coordinated  No   LUE   Fine Motor Movements are Fluid and Coordinated  No   LUE   9 Hole Peg Test  Right;Left    Right 9 Hole Peg Test  41.91    Left 9 Hole Peg Test  47.28    Other  Pt reports difficulty controling LUE at times and uses RUE to assist "crazy hand"      ROM / Strength   AROM / PROM / Strength  AROM;Strength      AROM   Overall AROM   Within functional limits for tasks performed    Overall AROM Comments  RUE noted decr control      Strength   Overall Strength  Deficits    Overall Strength Comments  RUE proximal stength grossly 5/5.  LUE proximal strength grossly 4 to 4-/5      Hand Function   Right Hand Grip (lbs)  44    Left Hand Grip (lbs)  20                      OT Education - 11/15/18 1131    Education Details  OT eval results/OT POC; how cognition can be affected by injury    Person(s) Educated  Patient;Caregiver(s)    Methods  Explanation    Comprehension  Verbalized understanding       OT Short Term Goals - 11/15/18 1147      OT SHORT TERM GOAL #1  Title  Pt will be independent with initial  HEP.--check STGs 12/27/18    Time  6    Period  Weeks    Status  New      OT SHORT TERM GOAL #2   Title  Pt will improve L grip strength by at least 5lbs to assit with IADLs.      Baseline  20lbs    Time  6    Period  Weeks    Status  New      OT SHORT TERM GOAL #3   Title  Pt will be improve LUE control/strength to able to performing targeted overhead reaching with LUE x without drops.    Time  6    Period  Weeks    Status  New      OT SHORT TERM GOAL #4   Title  Pt/family will verbalize understanding of cognitive compensation strategies for ADLs/IADLs.    Time  6    Period  Weeks    Status  New      OT SHORT TERM GOAL #5   Title  Pt will attend to functional cognitive tasks for at least 10 min with only min cueing for problem solving.    Time  6    Period  Weeks    Status  New        OT Long Term Goals - 11/15/18 1154      OT LONG TERM GOAL #1   Title  Pt will be independent with updated HEP.--check LTGs 02/13/19    Time  12    Period  Weeks    Status  New      OT LONG TERM GOAL #2   Title  Pt will improve L grip strength by at least 10lbs to assit with IADLs.      Time  12    Period  Weeks    Status  New      OT LONG TERM GOAL #3   Title  Pt will be able to retrieve/replace 3lb object on overhead shelf with LUE x3 with good safety/control.    Time  12    Period  Weeks    Status  New      OT LONG TERM GOAL #4   Title  Pt will perform BADLs and simple selected home maintenance tasks with distant supervision using schedule/strategies prn.    Time  12    Period  Weeks    Status  New      OT LONG TERM GOAL #5   Title  Pt will perform simple snack prep, use microwave, cold meal prep and make appropriate choices mod I.     Time  12    Period  Weeks    Status  New            Plan - 11/15/18 1134    Clinical Impression Statement  Pt is a 53 y.o. male s/p with TBI due to SDH due to fall sustained on 01-10-17.  Pt underwent Rt craniotomy (Rt  craniectomy for hematoma) on 01-13-17 and then had cranioplasty with placement of bone flap from abdomen to skull on 03-19-17.   Pt with PMH that includes:  DM, HTN, seizure disorder, hypothyroidism.  Pt presents with decr strength, decr coordination, cognitive deficits, decr balance/functional mobility.  Pt would benefit from occupational therapy to address these deficits for improved ADL/IADL independence and safety and improved LUE functional use.      Occupational Profile  and client history currently impacting functional performance  Pt was independent and working prior to injury; however, now pt needs set-up/supervision and cueing for ADLs/IADLs.  Pt enjoyed wrestling and soccer prior to injury, but is now unable to engage in these activities.    Occupational performance deficits (Please refer to evaluation for details):  ADL's;IADL's;Leisure;Work;Social Participation    Rehab Potential  Fair    Current Impairments/barriers affecting progress:  length of time since injury, cognitive deficits, Spanish speaking    OT Frequency  2x / week    OT Duration  12 weeks   +eval   OT Treatment/Interventions  Self-care/ADL training;Electrical Stimulation;Therapeutic exercise;Visual/perceptual remediation/compensation;Aquatic Therapy;Moist Heat;Paraffin;Neuromuscular education;Patient/family education;Splinting;Fluidtherapy;Energy conservation;Building services engineer;Therapeutic activities;Cryotherapy;Ultrasound;DME and/or AE instruction;Contrast Bath;Manual Therapy;Passive range of motion;Cognitive remediation/compensation;Balance training    Plan  initiate HEP for LUE (coordination, AAROM shoulder flex), begin strategies for cognition    Clinical Decision Making  Several treatment options, min-mod task modification necessary    Consulted and Agree with Plan of Care  Family member/caregiver;Patient    Family Member Consulted  sister       Patient will benefit from skilled therapeutic intervention in  order to improve the following deficits and impairments:  Decreased cognition, Decreased knowledge of use of DME, Impaired vision/preception, Decreased coordination, Abnormal gait, Decreased mobility, Decreased strength, Decreased range of motion, Decreased activity tolerance, Decreased balance, Decreased knowledge of precautions, Decreased safety awareness, Difficulty walking, Impaired perceived functional ability, Impaired UE functional use  Visit Diagnosis: Muscle weakness (generalized)  Other lack of coordination  Unsteadiness on feet  Other abnormalities of gait and mobility  Attention and concentration deficit  Frontal lobe and executive function deficit    Problem List Patient Active Problem List   Diagnosis Date Noted  . Hypothyroidism 09/21/2018  . Seizure disorder (HCC) 06/16/2018  . Low testosterone 06/16/2018  . Traumatic brain injury with loss of consciousness (HCC) 10/29/2017  . Anemia 10/29/2017  . Positive depression screening 10/29/2017  . Attention and concentration deficit   . Type 2 diabetes mellitus with peripheral neuropathy (HCC)   . S/P craniotomy 03/19/2017  . Essential hypertension   . Dysphagia, oropharyngeal 02/01/2017  . Diabetes type 2, uncontrolled (HCC) 06/13/2015    [facility] 11/15/2018, 4:10 PM  [facility] [address] [place], [place], [address] Phone: [phone]   Fax:  [phone]  Name: [name] MRN: [id number] Date of Birth: [birth year]   Willa Frater, OTR/L [facility] [address] [place], [place]  [address] [phone] phone [phone] 11/15/18 4:10 PM

## 2018-11-16 NOTE — Therapy (Signed)
[facility] [address] [place], [place], [address] Phone: [phone]   Fax:  [phone]  Physical Therapy Treatment  Patient Details  Name: [name] MRN: [id number] Date of Birth: September 30, 1966 Referring Provider (PT): Dr. Faith Rogue   Encounter Date: 11/15/2018  PT End of Session - 11/16/18 1146    Visit Number  8    Number of Visits  9    Date for PT Re-Evaluation  11/18/18    Authorization Type  [facility] 08-01-18 - 01-03-19    Authorization Time Period  08-01-18 - 01-03-19    PT Start Time  0849    PT Stop Time  0935    PT Time Calculation (min)  46 min    Equipment Utilized During Treatment  Gait belt    Activity Tolerance  Patient tolerated treatment well    Behavior During Therapy  [facility] for tasks assessed/performed       Past Medical History:  Diagnosis Date  . Diabetes mellitus without complication (HCC)   . Seizures (HCC)   . TBI (traumatic brain injury) (HCC) 03/2017    Past Surgical History:  Procedure Laterality Date  . CRANIOTOMY Right 01/13/2017   Procedure: 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, Placement of bone flap in abdomen;  Surgeon: Coletta Memos, MD;  Location: [facility] OR;  Service: Neurosurgery;  Laterality: Right;  . CRANIOTOMY Right 03/19/2017   Procedure: Cranioplasty with placement of bone flap from abdomen;  Surgeon: Tia Alert, MD;  Location: [facility] OR;  Service: Neurosurgery;  Laterality: Right;    There were no vitals filed for this visit.  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  No/denies                       [facility] Adult PT Treatment/Exercise - 11/16/18 0001      Ambulation/Gait   Ambulation/Gait  Yes    Ambulation/Gait Assistance  5: Supervision    Ambulation/Gait Assistance Details  cues to decrease hip flexion on RLE and to increase initial heel contact    Ambulation Distance (Feet)  230 Feet    Assistive device  None    Gait Pattern  Ataxic;Wide base of support;Decreased weight shift to left;Decreased stance time - left;Step-through pattern;Decreased step length - left    Ambulation Surface  Level;Indoor    Stairs  Yes    Stairs Assistance  5: Supervision    Stairs Assistance Details (indicate cue type and reason)  cues to place toes at edge of step prior to descending step    Stair Management Technique  No rails;Alternating pattern;Step to pattern   pattern varied   Number of Stairs  4   4 reps   Height of Stairs  6      High Level Balance   High Level Balance Activities  Backward  walking;Tandem walking;Marching forwards;Marching backwards       TherAct:  Activities on mini trampoline - jumping - 10 reps with cues to jump high to clear feet off trampoline  Marching in place on trampoline - 3# weight used on RLE to decrease ataxia  1/2 jumping jacks on floor due to too difficult to perform on mini trampoline; pt able to perform 5 reps with mod assist       PT Education - 11/16/18 1144    Education Details  informed sister for pt to practice 1/2 jumping jacks (legs only) and hopping for addition to HEP; also instructed pt in correct gait pattern for RLE for increased heel contact and decreased steppage gait in swing phase     Person(s) Educated  Patient;Caregiver(s)    Methods  Explanation;Demonstration    Comprehension  Verbalized  understanding;Returned demonstration          PT Long Term Goals - 10/19/18 1935      PT LONG TERM GOAL #1   Title  Pt will increase DGI score by at least 5 points to demo improved gait.    Time  4    Period  Weeks    Status  New    Target Date  11/19/18      PT LONG TERM GOAL #2   Title  Increase distance in 6" walk test by at least 100' for increased endurance and gait speed.    Time  4    Period  Weeks    Status  New    Target Date  11/19/18      PT LONG TERM GOAL #3   Title  Pt will negotiate steps with 1 rail only using a step over step sequence; sister will report pt negotiating steps at home in this manner (states he currently hugs the rail with both hands due to fear of falling).     Time  4    Period  Weeks    Status  New    Target Date  11/19/18      PT LONG TERM GOAL #4   Title  Increase gait velocity from 3.35 ft/sec to >/= 3.8 ft/sec for increased gait efficiency.     Baseline  9.38 secs = 3.35 ft/sec     Time  4    Period  Weeks    Status  New    Target Date  11/19/18      PT LONG TERM GOAL #5   Title  Improve TUG score from 13.58 secs to </= 11 secs for improved functional mobility.      Baseline  16.54, 13.58 secs    Time  4    Period  Weeks    Status  New    Target Date  11/19/18      Additional Long Term Goals   Additional Long Term Goals  Yes      PT LONG TERM GOAL #6   Title  Amb. 300' outside on paved uneven surface and on grassy terrain with supervision without LOB.    Time  4    Period  Weeks    Status  New    Target Date  11/19/18      PT LONG TERM GOAL #7   Title  Perform HEP with caregiver assistance prn due to cognitive deficits.    Time  4    Period  Weeks    Status  New    Target Date  11/19/18  Plan - 11/16/18 1146    Clinical Impression Statement  Pt improving with high level balance and coordination activities; pt unable to perform skipping activity with SLS with hop; pt had moderate difficulty with  jumping jacks but improved with practice and demonstrational cues;  pt's gait improved with cues to increase Rt initial heel contact and to avoid lifting leg high in swing through phase of gait     Rehab Potential  Good    Clinical Impairments Affecting Rehab Potential  language barrier and cognitive deficits - pt's sister [name] interpreted at eval    PT Frequency  2x / week    PT Duration  4 weeks    PT Treatment/Interventions  ADLs/Self Care Home Management;Aquatic Therapy;Gait training;Stair training;Therapeutic activities;Therapeutic exercise;Balance training;Neuromuscular re-education;Patient/family education    PT Next Visit Plan  check goals - renewal to be completed    PT Home Exercise Plan   [id number]; added side, forward and back kicks and braiding from [facility] on 10-27-18    Consulted and Agree with Plan of Care  Patient;Family member/caregiver    Family Member Consulted  sister [name]       Patient will benefit from skilled therapeutic intervention in order to improve the following deficits and impairments:  Abnormal gait, Decreased activity tolerance, Decreased balance, Decreased cognition, Decreased coordination, Decreased strength  Visit Diagnosis: Other abnormalities of gait and mobility  Other lack of coordination  Unsteadiness on feet     Problem List Patient Active Problem List   Diagnosis Date Noted  . Hypothyroidism 09/21/2018  . Seizure disorder (HCC) 06/16/2018  . Low testosterone 06/16/2018  . Traumatic brain injury with loss of consciousness (HCC) 10/29/2017  . Anemia 10/29/2017  . Positive depression screening 10/29/2017  . Attention and concentration deficit   . Type 2 diabetes mellitus with peripheral neuropathy (HCC)   . S/P craniotomy 03/19/2017  . Essential hypertension   . Dysphagia, oropharyngeal 02/01/2017  . Diabetes type 2, uncontrolled (HCC) 06/13/2015    Haik Mahoney, Donavan Burnet, PT 11/16/2018, 11:51 AM  [facility] [address] [place], [place], [address] Phone: [phone]   Fax:  [phone]  Name: [name] MRN: [id number] Date of Birth: 10/17/1966

## 2018-11-17 ENCOUNTER — Ambulatory Visit: Payer: Self-pay | Admitting: Physical Therapy

## 2018-11-17 DIAGNOSIS — R2689 Other abnormalities of gait and mobility: Secondary | ICD-10-CM

## 2018-11-17 DIAGNOSIS — R278 Other lack of coordination: Secondary | ICD-10-CM

## 2018-11-17 DIAGNOSIS — M6281 Muscle weakness (generalized): Secondary | ICD-10-CM

## 2018-11-18 NOTE — Therapy (Signed)
[facility] [address] , [place], [address] Phone: [phone]   Fax:  [phone]  Physical Therapy Treatment  Patient Details  Name: [name] MRN: [id number] Date of Birth: 07/21/1966 Referring Provider (PT): Dr. [name]   Encounter Date: 11/17/2018  PT End of Session - 11/18/18 1017    Visit Number  9    Number of Visits  17    Date for PT Re-Evaluation  12/18/18    Authorization Type  [facility] [employer] 08-01-18 - 01-03-19; renewal completed for 8 additional visits 11-17-17 - 12-18-18    Authorization Time Period  08-01-18 - 01-03-19    PT Start Time  0845    PT Stop Time  0931    PT Time Calculation (min)  46 min    Equipment Utilized During Treatment  Gait belt       Past Medical History:  Diagnosis Date  . Diabetes mellitus without complication (HCC)   . Seizures (HCC)   . TBI (traumatic brain injury) (HCC) 03/2017    Past Surgical History:  Procedure Laterality Date  . CRANIOTOMY Right 01/13/2017   Procedure: 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, Placement of bone flap in abdomen;  Surgeon: [name], MD;  Location: [facility] OR;  Service: Neurosurgery;  Laterality: Right;  . CRANIOTOMY Right 03/19/2017   Procedure: Cranioplasty with placement of bone flap from abdomen;  Surgeon: Jones, [name], MD;  Location: [facility] OR;  Service: Neurosurgery;  Laterality: Right;    There were no vitals filed for this visit.  Subjective Assessment - 11/18/18 0935    Subjective  Pt more verbal this am - says "Good morning" ; sister states he got up and made breakfast for the kids for the first time this morning    Patient is accompained by:  Family member;Interpreter    Pertinent History  TBI secondary to SDH due to fall on 01-10-17; Rt craniectomy for hematoma on 01-13-17; Cranioplasty with placement of bone flap from abdomen on 03-19-17; pt received HH (few visits per sister's  report) and no additional therapies due to lack of insurance at that time;  DM type 2 with peripheral neuropathy;  seizures    Patient Stated Goals  Improve walking and balance    Currently in Pain?  No/denies                       [facility] Adult PT Treatment/Exercise - 11/18/18 0001      Ambulation/Gait   Ambulation/Gait  Yes    Ambulation/Gait Assistance  5: Supervision    Ambulation/Gait Assistance Details  cues for increased Rt initial heel contact     Ambulation Distance (Feet)  500 Feet    Assistive device  None    Gait Pattern  Ataxic    Ambulation Surface  Level;Indoor;Unlevel;Outdoor;Paved;Gravel   outside in parking lot and on grass   Gait velocity  8.34 = 3.93 ft/sec with no device    Stairs  Yes    Stairs Assistance  5: Supervision    Stairs Assistance Details (indicate cue type and reason)  cues to place toes over edge of step prior to descending step    Stair Management Technique  No rails;One rail Right;Forwards;Alternating pattern;Step to pattern   1 rail - step over step; no rail step by step    Number of Stairs  4   6 -- 4 reps; 2 reps each (step over and step by   step)   Height of Stairs  6      Standardized Balance Assessment   Standardized Balance Assessment  Timed Up and Go Test      Timed Up and Go Test   TUG  Normal TUG    Normal TUG (seconds)  13.78   9.00 secs on 2nd rep     TherAct;  Practiced jumping on floor - pt performed 1/2 jumping jacks with min to mod assist; attempted jumping forward Over theraband on floor and then jumping backward (pt unable to perform this activity due to RLE ataxia and decr. Coordination)  Pt performed standing  - touching opposite elbow to knee with hip/knee flexion 5 reps single UE/opposite LE each side and then 5 reps Alternating each side for improved balance and coordination  Pt performed elliptical - level 1.5 1" forward and 1" backward for strengthening and coordination  NeuroRe-ed; standing balance  exercises on blue mat on floor - alternate forward, back and side kicks 10 reps each Marching in place slowly on mat for compliant surface training with CGA to min assist          PT Long Term Goals - 11/18/18 1022      PT LONG TERM GOAL #1   Title  Pt will increase DGI score by at least 5 points to demo improved gait.    Time  4    Period  Weeks    Status  On-going    Target Date  12/18/18      PT LONG TERM GOAL #2   Title  Increase distance in 6" walk test by at least 100' for increased endurance and gait speed.    Time  4    Period  Weeks    Status  On-going    Target Date  12/18/18      PT LONG TERM GOAL #3   Title  Pt will negotiate steps with 1 rail only using a step over step sequence; sister will report pt negotiating steps at home in this manner (states he currently hugs the rail with both hands due to fear of falling).     Baseline  sister states he continues to be fearful and hold onto rail with both hands at home but is improving with step negotiation at [facility] - 11-17-18    Time  4    Period  Weeks    Status  Partially Met    Target Date  12/18/18      PT LONG TERM GOAL #4   Title  Increase gait velocity from 3.35 ft/sec to >/= 3.8 ft/sec for increased gait efficiency.     Baseline  8.34 secs = 3.93 ft/sec on 11-17-18    Time  4    Period  Weeks    Status  Achieved      PT LONG TERM GOAL #5   Title  Improve TUG score from 13.58 secs to </= 11 secs for improved functional mobility.      Baseline  16.54, 13.58 secs;  9.00 secs on 11-17-18    Time  4    Period  Weeks    Status  Achieved      PT LONG TERM GOAL #6   Title  Amb. 300' outside on paved uneven surface and on grassy terrain with supervision without LOB.    Baseline  met 11-17-18    Status  Achieved      PT LONG TERM GOAL #7   Title  Perform  HEP with caregiver assistance prn due to cognitive deficits.    Baseline  met 11-17-18    Status  Achieved      PT LONG TERM GOAL #8   Title  Pt will jog  200' on flat even surface with supervision.    Time  4    Period  Weeks    Status  New    Target Date  12/18/18      PT LONG TERM GOAL  #9   TITLE  Pt will demonstrate improved gait pattern with reduced gait deviaitons by amb. with decreased steppage gait RLE with increased arm swing with no cues needed for initial heel contact RLE.    Time  4    Period  Weeks    Status  New    Target Date  12/18/18            Plan - 11/18/18 1018    Clinical Impression Statement  Pt continues to have difficulty with ballistic activities including jumping - unable to jump forward with both feet together over theraband on floor and also unable to jump backward; improving with jumping out/in (1/2 jumping jacks) due to decreased coordination and decr. motor control RLE; pt's gait is improving with increased speed and decreased steppage gait noted with RLE; pt has met LTG's #4-6;  LTG #1 and 2 ongoing as not assessed due to time constraint: LTG #3 partially met as sister reports pt continues to hold rail with both hands at home during step negotiation and have fear with step ascension/descension.                                                                                                                                        Rehab Potential  Good    Clinical Impairments Affecting Rehab Potential  language barrier and cognitive deficits - pt's sister [name] interpreted at eval    PT Frequency  2x / week    PT Duration  4 weeks    PT Treatment/Interventions  ADLs/Self Care Home Management;Aquatic Therapy;Gait training;Stair training;Therapeutic activities;Therapeutic exercise;Balance training;Neuromuscular re-education;Patient/family education    PT Next Visit Plan  renewal completed 11-17-18:  continue high level balance - try ladder activities, trampoline    PT Home Exercise Plan   [id number]; added side, forward and back kicks and braiding from [facility] on 10-27-18    Consulted and Agree with Plan of Care   Patient;Family member/caregiver    Family Member Consulted  sister [name]       Patient will benefit from skilled therapeutic intervention in order to improve the following deficits and impairments:  Abnormal gait, Decreased activity tolerance, Decreased balance, Decreased cognition, Decreased coordination, Decreased strength  Visit Diagnosis: Muscle weakness (generalized) - Plan: PT plan of care cert/re-cert  Other lack of coordination - Plan: PT plan of care cert/re-cert  Other abnormalities of gait and mobility - Plan: PT plan   of care cert/re-cert     Problem List Patient Active Problem List   Diagnosis Date Noted  . Hypothyroidism 09/21/2018  . Seizure disorder (HCC) 06/16/2018  . Low testosterone 06/16/2018  . Traumatic brain injury with loss of consciousness (HCC) 10/29/2017  . Anemia 10/29/2017  . Positive depression screening 10/29/2017  . Attention and concentration deficit   . Type 2 diabetes mellitus with peripheral neuropathy (HCC)   . S/P craniotomy 03/19/2017  . Essential hypertension   . Dysphagia, oropharyngeal 02/01/2017  . Diabetes type 2, uncontrolled (HCC) 06/13/2015    ,  Suzanne, PT 11/18/2018, 10:42 AM  [facility] [address] [place], [place], [address] Phone: [phone]   Fax:  [phone]  Name: [name] MRN: [id number] Date of Birth: 02/25/1966   

## 2018-11-21 ENCOUNTER — Ambulatory Visit: Payer: Self-pay | Admitting: Physical Medicine & Rehabilitation

## 2018-11-22 ENCOUNTER — Encounter: Payer: Self-pay | Admitting: Occupational Therapy

## 2018-11-22 ENCOUNTER — Telehealth: Payer: Self-pay

## 2018-11-22 ENCOUNTER — Encounter: Payer: Self-pay | Attending: Physical Medicine & Rehabilitation | Admitting: Physical Medicine & Rehabilitation

## 2018-11-22 ENCOUNTER — Ambulatory Visit: Payer: Self-pay | Admitting: Occupational Therapy

## 2018-11-22 ENCOUNTER — Ambulatory Visit: Payer: Self-pay | Admitting: Physical Therapy

## 2018-11-22 ENCOUNTER — Encounter: Payer: Self-pay | Admitting: Physical Medicine & Rehabilitation

## 2018-11-22 VITALS — BP 99/62 | HR 98 | Resp 14 | Ht 61.0 in | Wt 144.0 lb

## 2018-11-22 DIAGNOSIS — Z9889 Other specified postprocedural states: Secondary | ICD-10-CM | POA: Insufficient documentation

## 2018-11-22 DIAGNOSIS — R278 Other lack of coordination: Secondary | ICD-10-CM

## 2018-11-22 DIAGNOSIS — I10 Essential (primary) hypertension: Secondary | ICD-10-CM | POA: Insufficient documentation

## 2018-11-22 DIAGNOSIS — R2689 Other abnormalities of gait and mobility: Secondary | ICD-10-CM

## 2018-11-22 DIAGNOSIS — R2681 Unsteadiness on feet: Secondary | ICD-10-CM

## 2018-11-22 DIAGNOSIS — E1142 Type 2 diabetes mellitus with diabetic polyneuropathy: Secondary | ICD-10-CM | POA: Insufficient documentation

## 2018-11-22 DIAGNOSIS — S069X9S Unspecified intracranial injury with loss of consciousness of unspecified duration, sequela: Secondary | ICD-10-CM

## 2018-11-22 DIAGNOSIS — M6281 Muscle weakness (generalized): Secondary | ICD-10-CM

## 2018-11-22 DIAGNOSIS — R5383 Other fatigue: Secondary | ICD-10-CM | POA: Insufficient documentation

## 2018-11-22 DIAGNOSIS — S0219XS Other fracture of base of skull, sequela: Secondary | ICD-10-CM | POA: Insufficient documentation

## 2018-11-22 DIAGNOSIS — R41840 Attention and concentration deficit: Secondary | ICD-10-CM

## 2018-11-22 DIAGNOSIS — R269 Unspecified abnormalities of gait and mobility: Secondary | ICD-10-CM | POA: Insufficient documentation

## 2018-11-22 DIAGNOSIS — G40909 Epilepsy, unspecified, not intractable, without status epilepticus: Secondary | ICD-10-CM

## 2018-11-22 DIAGNOSIS — R569 Unspecified convulsions: Secondary | ICD-10-CM | POA: Insufficient documentation

## 2018-11-22 DIAGNOSIS — F329 Major depressive disorder, single episode, unspecified: Secondary | ICD-10-CM | POA: Insufficient documentation

## 2018-11-22 MED ORDER — METHYLPHENIDATE HCL 5 MG PO TABS: 5.0000 mg | ORAL_TABLET | Freq: Two times a day (BID) | ORAL | 0 refills | Status: DC

## 2018-11-22 NOTE — Telephone Encounter (Signed)
Shayla from [facility] pharmacy called stating that they do not carry C2 medication so Methylphenidate will have to be sent to another pharmacy. [facility] is in preferred pharmacies too.

## 2018-11-22 NOTE — Therapy (Signed)
[facility] [address] [place], [place], [address] Phone: [phone]   Fax:  [phone]  Occupational Therapy Treatment  Patient Details  Name: [name] MRN: [id number] Date of Birth: 01-Sep-1966 Referring Provider (OT): Dr. Faith Rogue   Encounter Date: 11/22/2018  OT End of Session - 11/22/18 0821    Visit Number  2    Number of Visits  25    Date for OT Re-Evaluation  02/13/19    Authorization Type  [facility] [employer] 08/01/18-01/03/19     OT Start Time  0754    OT Stop Time  0833    OT Time Calculation (min)  39 min    Activity Tolerance  Patient tolerated treatment well    Behavior During Therapy  [facility] for tasks assessed/performed;Flat affect       Past Medical History:  Diagnosis Date  . Diabetes mellitus without complication (HCC)   . Seizures (HCC)   . TBI (traumatic brain injury) (HCC) 03/2017    Past Surgical History:  Procedure Laterality Date  . CRANIOTOMY Right 01/13/2017   Procedure: 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, Placement of bone flap in abdomen;  Surgeon: Coletta Memos, MD;  Location: [facility] OR;  Service: Neurosurgery;  Laterality: Right;  . CRANIOTOMY Right 03/19/2017   Procedure: Cranioplasty with placement of bone flap from abdomen;  Surgeon: Tia Alert, MD;  Location: [facility] OR;  Service: Neurosurgery;  Laterality: Right;    There were no vitals filed for this visit.  Subjective Assessment - 11/22/18 0803    Subjective   Pt has made a ham sandwich for his nephew    Patient is accompained by:  Family member;Interpreter   sister   Patient Stated Goals  "to be well"    Currently in Pain?  No/denies                  OT Education - 11/22/18 1524    Education Details  Coordination HEP; Initial Activities/strategies for improved cognition--see pt instructions; how to implement strategies/cue pt for cognitive deficits    Person(s)  Educated  Patient;Caregiver(s)   sister can read/speak [place]   Methods  Explanation;Handout;Demonstration;Verbal cues    Comprehension  Verbalized understanding;Returned demonstration;Need further instruction       OT Short Term Goals - 11/15/18 1147      OT SHORT TERM GOAL #1   Title  Pt will be independent with initial HEP.--check STGs 12/27/18    Time  6    Period  Weeks    Status  New      OT SHORT TERM GOAL #2   Title  Pt will improve L grip strength by at least 5lbs to assit with IADLs.      Baseline  20lbs    Time  6    Period  Weeks    Status  New      OT SHORT TERM GOAL #3   Title  Pt will be improve LUE control/strength to able to performing targeted overhead reaching with LUE x without drops.    Time  6    Period  Weeks    Status  New      OT SHORT TERM GOAL #4   Title  Pt/family will verbalize understanding of cognitive compensation strategies for ADLs/IADLs.    Time  6    Period  Weeks    Status  New      OT SHORT TERM GOAL #5  Title  Pt will attend to functional cognitive tasks for at least 10 min with only min cueing for problem solving.    Time  6    Period  Weeks    Status  New        OT Long Term Goals - 11/15/18 1154      OT LONG TERM GOAL #1   Title  Pt will be independent with updated HEP.--check LTGs 02/13/19    Time  12    Period  Weeks    Status  New      OT LONG TERM GOAL #2   Title  Pt will improve L grip strength by at least 10lbs to assit with IADLs.      Time  12    Period  Weeks    Status  New      OT LONG TERM GOAL #3   Title  Pt will be able to retrieve/replace 3lb object on overhead shelf with LUE x3 with good safety/control.    Time  12    Period  Weeks    Status  New      OT LONG TERM GOAL #4   Title  Pt will perform BADLs and simple selected home maintenance tasks with distant supervision using schedule/strategies prn.    Time  12    Period  Weeks    Status  New      OT LONG TERM GOAL #5   Title  Pt will  perform simple snack prep, use microwave, cold meal prep and make appropriate choices mod I.     Time  12    Period  Weeks    Status  New            Plan - 11/22/18 1525    Clinical Impression Statement  Pt is progressing towards goals.  Pt able to return demo coordination HEP with LUE but needed cueing for continuation of tasks due to decr attention.  Pt/sister educated on cognitive deficits from injury and ways to promote incr independence by using written list/schedule.    Occupational Profile and client history currently impacting functional performance  Pt was independent and working prior to injury; however, now pt needs set-up/supervision and cueing for ADLs/IADLs.  Pt enjoyed wrestling and soccer prior to injury, but is now unable to engage in these activities.    Occupational performance deficits (Please refer to evaluation for details):  ADL's;IADL's;Leisure;Work;Social Participation    Rehab Potential  Fair    Current Impairments/barriers affecting progress:  length of time since injury, cognitive deficits, Spanish speaking    OT Frequency  2x / week    OT Duration  12 weeks   +eval   OT Treatment/Interventions  Self-care/ADL training;Electrical Stimulation;Therapeutic exercise;Visual/perceptual remediation/compensation;Aquatic Therapy;Moist Heat;Paraffin;Neuromuscular education;Patient/family education;Splinting;Fluidtherapy;Energy conservation;Building services engineerunctional Mobility Training;Therapeutic activities;Cryotherapy;Ultrasound;DME and/or AE instruction;Contrast Bath;Manual Therapy;Passive range of motion;Cognitive remediation/compensation;Balance training    Plan  review coordination HEP and add for LUE AAROM shoulder flex, putty, continue with strategies/activities for cognition    Clinical Decision Making  Several treatment options, min-mod task modification necessary    Consulted and Agree with Plan of Care  Family member/caregiver;Patient    Family Member Consulted  sister        Patient will benefit from skilled therapeutic intervention in order to improve the following deficits and impairments:  Decreased cognition, Decreased knowledge of use of DME, Impaired vision/preception, Decreased coordination, Abnormal gait, Decreased mobility, Decreased strength, Decreased range of motion, Decreased activity tolerance, Decreased balance, Decreased knowledge  of precautions, Decreased safety awareness, Difficulty walking, Impaired perceived functional ability, Impaired UE functional use  Visit Diagnosis: Muscle weakness (generalized)  Other lack of coordination  Other abnormalities of gait and mobility  Unsteadiness on feet  Attention and concentration deficit    Problem List Patient Active Problem List   Diagnosis Date Noted  . Hypothyroidism 09/21/2018  . Seizure disorder (HCC) 06/16/2018  . Low testosterone 06/16/2018  . Traumatic brain injury with loss of consciousness (HCC) 10/29/2017  . Anemia 10/29/2017  . Positive depression screening 10/29/2017  . Attention and concentration deficit   . Type 2 diabetes mellitus with peripheral neuropathy (HCC)   . S/P craniotomy 03/19/2017  . Essential hypertension   . Dysphagia, oropharyngeal 02/01/2017  . Diabetes type 2, uncontrolled (HCC) 06/13/2015    [facility] Marina Del [facility] HospitalFREEMAN,ANGELA 11/22/2018, 3:29 PM  [facility] [address] [place], [place], [address] Phone: [phone]   Fax:  [phone]  Name: [name] MRN: [id number] Date of Birth: 16-Jun-1966   Willa FraterAngela Freeman, OTR/L [facility] [address] [place], [place]  [address] [phone] phone [phone] 11/22/18 3:29 PM

## 2018-11-22 NOTE — Telephone Encounter (Signed)
New rx [facility]

## 2018-11-22 NOTE — Patient Instructions (Signed)
PLEASE FEEL FREE TO CALL OUR OFFICE WITH ANY PROBLEMS OR QUESTIONS ([phone])      

## 2018-11-22 NOTE — Patient Instructions (Signed)
   Coordination Activities  Perform the following activities for 15 minutes 2 times per day with left hand(s).   Rotate ball in fingertips (clockwise and counter-clockwise).  Flip cards 1 at a time.  Deal cards with your thumb (Hold deck in hand and push card off top with thumb).  Pick up coins and stack.  Pick up coins one at a time until you get 5-10 in your hand, then move coins from palm to fingertips to stack one at a time.     Cognitive Activities  -Play simple card or board games -Sort cards, place cards in numerical order -Fold towels -sort clothes -sort silverwear -use calendar and daily "to do list"  (start with 2-3 tasks before break, then add as able)

## 2018-11-22 NOTE — Progress Notes (Signed)
Subjective:    Patient ID: [name], male    DOB: Jan 28, 1966, 53 y.o.   MRN: [id number]  HPI   [name] is here in follow up of his TBI. He has been involved in therapy at neuro rehab. His balance is improving. He fell once at [employer] when he was pulling out a bag which unexpectedly came loose.   His fatigue is better. The increase in his testosterone has helped with this. The keppra still causes fatigue.   Memory is still a struggle for day to day information. Wife notes that he forgets a lot.  She also notices that concentration can be a problem as well.  From a mood standpoint he is better.  He denies any depression.  He usually feels better when he is around family as well.   Pain Inventory Average Pain 0 Pain Right Now 0 My pain is no pain  In the last 24 hours, has pain interfered with the following? General activity 0 Relation with others 0 Enjoyment of life 0 What TIME of day is your pain at its worst? no pain Sleep (in general) Good  Pain is worse with: no pain Pain improves with: no pain Relief from Meds: no pain  Mobility walk without assistance how many minutes can you walk? 15 ability to climb steps?  yes do you drive?  no Do you have any goals in this area?  yes  Function not employed: date last employed . I need assistance with the following:  meal prep and household duties  Neuro/Psych confusion  Prior Studies Any changes since last visit?  no  Physicians involved in your care Any changes since last visit?  no   Family History  Problem Relation Age of Onset  . Diabetes Mother   . Diabetes Father    Social History   Socioeconomic History  . Marital status: Single    Spouse name: Not on file  . Number of children: 1  . Years of education: 60  . Highest education level: High school graduate  Occupational History  . 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 Financial resource strain: Not on file  . Food insecurity:   Worry: Not on file    Inability: Not on file  . Transportation needs:    Medical: Not on file    Non-medical: Not on file  Tobacco Use  . Smoking status: Never Smoker  . Smokeless tobacco: Never Used  Substance and Sexual Activity  . Alcohol use: Yes    Comment: socially, not since accident  . Drug use: No  . Sexual activity: Not on file  Lifestyle  . Physical activity:    Days per week: Not on file    Minutes per session: Not on file  . Stress: Not on file  Relationships  . Social connections:    Talks on phone: Not on file    Gets together: Not on file    Attends religious service: Not on file    Active member of club or organization: Not on file    Attends meetings of clubs or organizations: Not on file    Relationship status: Not on file  Other Topics Concern  . Not on file  Social History Narrative   Lives with sister and her family.  Has one daughter in [place].  Does not work.  Education: high school.    Past Surgical History:  Procedure Laterality Date  . CRANIOTOMY Right 01/13/2017   Procedure: RIGHT FRONTO  TEMPORAL PARIETAL CRANIECTOMY FOR HEMATOMA EVACUATION, Placement of bone flap in abdomen;  Surgeon: Coletta Memos, MD;  Location: [facility] OR;  Service: Neurosurgery;  Laterality: Right;  . CRANIOTOMY Right 03/19/2017   Procedure: Cranioplasty with placement of bone flap from abdomen;  Surgeon: Tia Alert, MD;  Location: [facility] OR;  Service: Neurosurgery;  Laterality: Right;   Past Medical History:  Diagnosis Date  . Diabetes mellitus without complication (HCC)   . Seizures (HCC)   . TBI (traumatic brain injury) (HCC) 03/2017   BP 99/62   Pulse 98   Resp 14   Ht 5\' 1"  (1.549 m)   Wt 144 lb (65.3 kg)   SpO2 97%   BMI 27.21 kg/m   Opioid Risk Score:   Fall Risk Score:  `1  Depression screen PHQ 2/9  Depression screen [facility] 2/9 09/21/2018 07/28/2018 06/16/2018 03/04/2018 10/29/2017 06/29/2017 05/04/2017  Decreased Interest 3 1 0 2 2 0 0  Down, Depressed, Hopeless 0 0  0 0 0 0 0  PHQ - 2 Score 3 1 0 2 2 0 0  Altered sleeping 0 1 - 1 3 0 0  Tired, decreased energy 1 2 - 0 0 3 1  Change in appetite 0 2 - 2 0 0 0  Feeling bad or failure about yourself  0 0 - 0 0 0 0  Trouble concentrating 1 0 - 0 2 0 1  Moving slowly or fidgety/restless 1 1 - 0 0 0 0  Suicidal thoughts 0 0 - 0 0 0 0  PHQ-9 Score 6 7 - 5 7 3 2   Some recent data might be hidden    Review of Systems  Constitutional: Negative.   HENT: Negative.   Eyes: Negative.   Respiratory: Negative.   Cardiovascular: Negative.   Gastrointestinal: Negative.   Endocrine: Negative.   Genitourinary: Negative.   Musculoskeletal: Negative.   Skin: Negative.   Allergic/Immunologic: Negative.   Neurological: Negative.   Hematological: Negative.   Psychiatric/Behavioral: Positive for confusion.  All other systems reviewed and are negative.      Objective:   Physical Exam General: No acute distress HEENT: EOMI, oral membranes moist Cards: reg rate  Chest: normal effort Abdomen: Soft, NT, ND Skin: dry, intact Extremities: no edema Musculoskeletal: He exhibits no edema or tenderness.  Neurological: He is alert.  Motor: 5/5in all fours.Gait is improving with less steppage pattern.  Better arm swing and weight shift.  Balance overall has progressed. Skin: Skin is warm and dry.  Psychiatric: More animated.  Seems to be focusing better.    Assessment & Plan:  1. Decreased functional mobility secondary to right intraparenchymal hemorrhage-SDH-temporal bone fracture after fall. Status post right frontotemporoparietal craniectomy 01/14/2017 with cranioplasty 03/19/2017 -discussed the need to keep organizer/routine at home            --continue with outpt therapies.       -begin trial of rtalin 5mg  daily for attention, may boost memory also 2. Seizure prophylaxis. keppra resumed, 1000mg  at night. Per neuro 3. Diabetes mellitus peripheral neuropathy. Per primary.  4. Hypertension.  bp controlled off meds 5. Fatigue: - likely partitally due to keppra-            -testosterone dose increased after last visit, thyroid levels were ok -follow up levels later this year          -pt denies depression  of face to face patient care time were spent during this visit. All questions were encouraged  and answered. Follow up in2 months. .Marland Kitchen

## 2018-11-23 NOTE — Therapy (Signed)
[facility] [address] [place], [place], [address] Phone: [phone]   Fax:  [phone]  Physical Therapy Treatment  Patient Details  Name: [name] MRN: [id number] Date of Birth: Feb 28, 1966 Referring Provider (PT): Dr. Alger Simons   Encounter Date: 11/22/2018  PT End of Session - 11/23/18 2037    Visit Number  10    Number of Visits  17    Date for PT Re-Evaluation  12/18/18    Authorization Type  [facility] [employer] 08-01-18 - 01-03-19; renewal completed for 8 additional visits 11-17-17 - 12-18-18    Authorization Time Period  08-01-18 - 01-03-19    PT Start Time  0850    PT Stop Time  0934    PT Time Calculation (min)  44 min    Equipment Utilized During Treatment  Gait belt       Past Medical History:  Diagnosis Date  . Diabetes mellitus without complication ([place])   . Seizures ([place])   . TBI (traumatic brain injury) ([place]) 03/2017    Past Surgical History:  Procedure Laterality Date  . CRANIOTOMY Right 01/13/2017   Procedure: 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, Placement of bone flap in abdomen;  Surgeon: Ashok Pall, MD;  Location: [facility];  Service: Neurosurgery;  Laterality: Right;  . CRANIOTOMY Right 03/19/2017   Procedure: Cranioplasty with placement of bone flap from abdomen;  Surgeon: Eustace Moore, MD;  Location: [facility];  Service: Neurosurgery;  Laterality: Right;    There were no vitals filed for this visit.  Subjective Assessment - 11/23/18 2032    Subjective  Pt reports no problems or changes since last visit    Patient is accompained by:  Family member;Interpreter    Patient Stated Goals  Improve walking and balance    Currently in Pain?  No/denies                       [facility] Adult PT Treatment/Exercise - 11/23/18 0001      Transfers   Transfers  Sit to Stand    Number of Reps  Other reps (comment)   5   Comments  no UE support used        Ambulation/Gait   Ambulation/Gait  Yes    Ambulation/Gait Assistance  5: Supervision    Ambulation/Gait Assistance Details  cues for increased Rt initial heel contact and incr. Rt arm swing    Ambulation Distance (Feet)  350 Feet    Assistive device  None    Gait Pattern  Step-through pattern    Ambulation Surface  Level;Indoor    Stairs  Yes    Stairs Assistance  5: Supervision    Stairs Assistance Details (indicate cue type and reason)  cues for foot positioning during descension    Stair Management Technique  No rails    Number of Stairs  4    Height of Stairs  6      Exercises   Exercises  Lumbar;Knee/Hip;Ankle      Lumbar Exercises: Aerobic   Elliptical  1" forward level 1.5          Balance Exercises - 11/23/18 2037      Balance Exercises: Standing   Other Standing Exercises  pt performed coordination exercise - tapping balance bubbles and then cone taps progressing to tipping cone over and standing upright for improved SLS on stance leg - pt performed 5 reps each leg with  min hand held assist       Ladder activity - step over step sequence, stepping out - in for facilitation of coordination in bil. LE's  marching on [employer] - with CGA for safety   Pt performed jumping, hopping, single limb hopping attempted (pt unable) - 1/2 jumping jacks x 5 reps each leg with min assist for balance and cues for coordination     PT Long Term Goals - 11/18/18 1022      PT LONG TERM GOAL #1   Title  Pt will increase DGI score by at least 5 points to demo improved gait.    Time  4    Period  Weeks    Status  On-going    Target Date  12/18/18      PT LONG TERM GOAL #2   Title  Increase distance in 6" walk test by at least 100' for increased endurance and gait speed.    Time  4    Period  Weeks    Status  On-going    Target Date  12/18/18      PT LONG TERM GOAL #3   Title  Pt will negotiate steps with 1 rail only using a step over step sequence; sister will report pt  negotiating steps at home in this manner (states he currently hugs the rail with both hands due to fear of falling).     Baseline  sister states he continues to be fearful and hold onto rail with both hands at home but is improving with step negotiation at [facility] - 11-17-18    Time  4    Period  Weeks    Status  Partially Met    Target Date  12/18/18      PT LONG TERM GOAL #4   Title  Increase gait velocity from 3.35 ft/sec to >/= 3.8 ft/sec for increased gait efficiency.     Baseline  8.34 secs = 3.93 ft/sec on 11-17-18    Time  4    Period  Weeks    Status  Achieved      PT LONG TERM GOAL #5   Title  Improve TUG score from 13.58 secs to </= 11 secs for improved functional mobility.      Baseline  16.54, 13.58 secs;  9.00 secs on 11-17-18    Time  4    Period  Weeks    Status  Achieved      PT LONG TERM GOAL #6   Title  Amb. 300' outside on paved uneven surface and on grassy terrain with supervision without LOB.    Baseline  met 11-17-18    Status  Achieved      PT LONG TERM GOAL #7   Title  Perform HEP with caregiver assistance prn due to cognitive deficits.    Baseline  met 11-17-18    Status  Achieved      PT LONG TERM GOAL #8   Title  Pt will jog 200' on flat even surface with supervision.    Time  4    Period  Weeks    Status  New    Target Date  12/18/18      PT LONG TERM GOAL  #9   TITLE  Pt will demonstrate improved gait pattern with reduced gait deviaitons by amb. with decreased steppage gait RLE with increased arm swing with no cues needed for initial heel contact RLE.    Time  4  Period  Weeks    Status  New    Target Date  12/18/18            Plan - 11/23/18 2038    Clinical Impression Statement  Pt's gait is improving significantly with decreased steppage gait noted RLE and overal improved initial heel contact with less verbal cues required; pt continues to have some difficulty with single limb hopping but improves with practice and repetition;       Rehab Potential  Good    Clinical Impairments Affecting Rehab Potential  language barrier and cognitive deficits - pt's sister [name] interpreted at eval    PT Frequency  2x / week    PT Duration  4 weeks    PT Treatment/Interventions  ADLs/Self Care Home Management;Aquatic Therapy;Gait training;Stair training;Therapeutic activities;Therapeutic exercise;Balance training;Neuromuscular re-education;Patient/family education    PT Next Visit Plan  continue high level balance activities    PT Home Exercise Plan   [id number]; added side, forward and back kicks and braiding from [facility] on 10-27-18    Consulted and Agree with Plan of Care  Patient;Family member/caregiver    Family Member Consulted  sister [name]       Patient will benefit from skilled therapeutic intervention in order to improve the following deficits and impairments:  Abnormal gait, Decreased activity tolerance, Decreased balance, Decreased cognition, Decreased coordination, Decreased strength  Visit Diagnosis: Muscle weakness (generalized)  Other lack of coordination  Other abnormalities of gait and mobility  Unsteadiness on feet     Problem List Patient Active Problem List   Diagnosis Date Noted  . Hypothyroidism 09/21/2018  . Seizure disorder ([place]) 06/16/2018  . Low testosterone 06/16/2018  . Traumatic brain injury with loss of consciousness ([place]) 10/29/2017  . Anemia 10/29/2017  . Positive depression screening 10/29/2017  . Attention and concentration deficit   . Type 2 diabetes mellitus with peripheral neuropathy ([facility])   . S/P craniotomy 03/19/2017  . Essential hypertension   . Dysphagia, oropharyngeal 02/01/2017  . Diabetes type 2, uncontrolled ([place]) 06/13/2015    Tomesha Sargent, [place], PT  11/23/2018, 8:43 PM  [facility] [address] [place], [place], [address] Phone: [phone]   Fax:  [phone]  Name: Keondrick Dilks MRN:  [id number] Date of Birth: 20-Aug-1966

## 2018-11-29 ENCOUNTER — Encounter: Payer: Self-pay | Admitting: Occupational Therapy

## 2018-11-29 ENCOUNTER — Ambulatory Visit: Payer: Self-pay | Admitting: Physical Therapy

## 2018-11-29 ENCOUNTER — Ambulatory Visit: Payer: Self-pay | Admitting: Occupational Therapy

## 2018-11-29 DIAGNOSIS — R2689 Other abnormalities of gait and mobility: Secondary | ICD-10-CM

## 2018-11-29 DIAGNOSIS — R2681 Unsteadiness on feet: Secondary | ICD-10-CM

## 2018-11-29 DIAGNOSIS — R278 Other lack of coordination: Secondary | ICD-10-CM

## 2018-11-29 DIAGNOSIS — M6281 Muscle weakness (generalized): Secondary | ICD-10-CM

## 2018-11-29 DIAGNOSIS — R41844 Frontal lobe and executive function deficit: Secondary | ICD-10-CM

## 2018-11-29 DIAGNOSIS — R41840 Attention and concentration deficit: Secondary | ICD-10-CM

## 2018-11-29 NOTE — Therapy (Signed)
[facility] [address] [place], [place], [address] Phone: [phone]   Fax:  [phone]  Occupational Therapy Treatment  Patient Details  Name: Jamie BeckwithMiguel Garcia Berg MRN: [id number] Date of Birth: 1966/02/13 Referring Provider (OT): Dr. Faith RogueZachary Swartz   Encounter Date: 11/29/2018  OT End of Session - 11/29/18 1347    Visit Number  3    Number of Visits  25    Date for OT Re-Evaluation  02/13/19    Authorization Type  [facility] [employer] 08/01/18-01/03/19     OT Start Time  0938    OT Stop Time  1017    OT Time Calculation (min)  39 min    Activity Tolerance  Patient tolerated treatment well    Behavior During Therapy  [facility] for tasks assessed/performed;Flat affect       Past Medical History:  Diagnosis Date  . Diabetes mellitus without complication ([facility])   . Seizures ([facility])   . TBI (traumatic brain injury) ([facility]) 03/2017    Past Surgical History:  Procedure Laterality Date  . CRANIOTOMY Right 01/13/2017   Procedure: 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, Placement of bone flap in abdomen;  Surgeon: Coletta MemosKyle Cabbell, MD;  [facility];  Service: Neurosurgery;  Laterality: Right;  . CRANIOTOMY Right 03/19/2017   Procedure: Cranioplasty with placement of bone flap from abdomen;  Surgeon: [name], [name], MD;  [facility];  Service: Neurosurgery;  Laterality: Right;    There were no vitals filed for this visit.  Subjective Assessment - 11/29/18 0938    Patient is accompained by:  Family member;Interpreter   sister   Patient Stated Goals  "to be well"    Currently in Pain?  No/denies        Simple visual scanning/letter cancellation sheet with approx 75% accuracy with min cueing to correct and use line guide/finger with scanning.  Completing simple 12-piece puzzle with mod cueing/incr time for problem-solving and organization.     Completing dot-to-dot for visual scanning and  sequencing with min-mod cueing/incr time.    Copying phone numbers from L side of page to R side with approx 78% accuracy, but improved with repetition after min v.c.  Reviewed/discussed previous recommendations for using daily "to do list" and calendar.  Pt/sister report that they are starting to try this.  Continued with compensations strategies.        OT Education - 11/29/18 1354    Education Details  Compensation Strategies for Visual-Perceptual Skills/L inattention and HEP/activities for cognitive/visual-perceptual deficits--see pt instructions    Person(s) Educated  Patient;Caregiver(s)    Methods  Explanation;Demonstration;Handout;Verbal cues    Comprehension  Verbalized understanding;Returned demonstration;Verbal cues required;Need further instruction       OT Short Term Goals - 11/15/18 1147      OT SHORT TERM GOAL #1   Title  Pt will be independent with initial HEP.--check STGs 12/27/18    Time  6    Period  Weeks    Status  New      OT SHORT TERM GOAL #2   Title  Pt will improve L grip strength by at least 5lbs to assit with IADLs.      Baseline  20lbs    Time  6    Period  Weeks    Status  New      OT SHORT TERM GOAL #3   Title  Pt will be improve LUE control/strength to able to performing targeted overhead reaching with  LUE x 5min without drops.    Time  6    Period  Weeks    Status  New      OT SHORT TERM GOAL #4   Title  Pt/family will verbalize understanding of cognitive compensation strategies for ADLs/IADLs.    Time  6    Period  Weeks    Status  New      OT SHORT TERM GOAL #5   Title  Pt will attend to functional cognitive tasks for at least 10 min with only min cueing for problem solving.    Time  6    Period  Weeks    Status  New        OT Long Term Goals - 11/15/18 1154      OT LONG TERM GOAL #1   Title  Pt will be independent with updated HEP.--check LTGs 02/13/19    Time  12    Period  Weeks    Status  New      OT LONG TERM GOAL #2    Title  Pt will improve L grip strength by at least 10lbs to assit with IADLs.      Time  12    Period  Weeks    Status  New      OT LONG TERM GOAL #3   Title  Pt will be able to retrieve/replace 3lb object on overhead shelf with LUE x3 with good safety/control.    Time  12    Period  Weeks    Status  New      OT LONG TERM GOAL #4   Title  Pt will perform BADLs and simple selected home maintenance tasks with distant supervision using schedule/strategies prn.    Time  12    Period  Weeks    Status  New      OT LONG TERM GOAL #5   Title  Pt will perform simple snack prep, use microwave, cold meal prep and make appropriate choices mod I.     Time  12    Period  Weeks    Status  New            Plan - 11/29/18 1049    Clinical Impression Statement  Pt/family verbalized understanding of activities to encourage visual perceptual and cognitive skills as well as cueing for L inattention.    Occupational Profile and client history currently impacting functional performance  Pt was independent and working prior to injury; however, now pt needs set-up/supervision and cueing for ADLs/IADLs.  Pt enjoyed wrestling and soccer prior to injury, but is now unable to engage in these activities.    Occupational performance deficits (Please refer to evaluation for details):  ADL's;IADL's;Leisure;Work;Social Participation    Rehab Potential  Fair    Current Impairments/barriers affecting progress:  length of time since injury, cognitive deficits, Spanish speaking    OT Frequency  2x / week    OT Duration  12 weeks   +eval   OT Treatment/Interventions  Self-care/ADL training;Electrical Stimulation;Therapeutic exercise;Visual/perceptual remediation/compensation;Aquatic Therapy;Moist Heat;Paraffin;Neuromuscular education;Patient/family education;Splinting;Fluidtherapy;Energy conservation;Building services engineerunctional Mobility Training;Therapeutic activities;Cryotherapy;Ultrasound;DME and/or AE instruction;Contrast  Bath;Manual Therapy;Passive range of motion;Cognitive remediation/compensation;Balance training    Plan  review coordination HEP and add for LUE AAROM shoulder flex, putty, continue with strategies/activities for cognition    Clinical Decision Making  Several treatment options, min-mod task modification necessary    Consulted and Agree with Plan of Care  Family member/caregiver;Patient    Family Member Consulted  sister  Patient will benefit from skilled therapeutic intervention in order to improve the following deficits and impairments:  Decreased cognition, Decreased knowledge of use of DME, Impaired vision/preception, Decreased coordination, Abnormal gait, Decreased mobility, Decreased strength, Decreased range of motion, Decreased activity tolerance, Decreased balance, Decreased knowledge of precautions, Decreased safety awareness, Difficulty walking, Impaired perceived functional ability, Impaired UE functional use  Visit Diagnosis: Attention and concentration deficit  Frontal lobe and executive function deficit  Unsteadiness on feet  Other abnormalities of gait and mobility  Other lack of coordination  Muscle weakness (generalized)    Problem List Patient Active Problem List   Diagnosis Date Noted  . Hypothyroidism 09/21/2018  . Seizure disorder ([facility]) 06/16/2018  . Low testosterone 06/16/2018  . Traumatic brain injury with loss of consciousness ([facility]) 10/29/2017  . Anemia 10/29/2017  . Positive depression screening 10/29/2017  . Attention and concentration deficit   . Type 2 diabetes mellitus with peripheral neuropathy ([facility])   . S/P craniotomy 03/19/2017  . Essential hypertension   . Dysphagia, oropharyngeal 02/01/2017  . Diabetes type 2, uncontrolled ([facility]) 06/13/2015    [facility] 11/29/2018, 3:01 PM  [facility] [address] [place], [place], [address] Phone: [phone]   Fax:   [phone]  Name: [name] MRN: [id number] Date of Birth: [birth year]   Willa Frater, OTR/L [facility] [address] [place], [place]  [address] [phone] phone [phone] 11/29/18 3:01 PM

## 2018-11-29 NOTE — Patient Instructions (Signed)
Compensation strategies: 1. Look for the edge of objects (to the left and/or right) so that you make sure you are seeing all of an object 2. Turn your head when walking, scan from side to side, particularly in busy environments and have someone with you for safety. 3. Use an organized scanning pattern. It's usually easier to scan from top to bottom, and left to right (like you are reading) 4. Double check yourself 5. Use a line guide (like a blank piece of paper) or your finger when reading 6. If necessary, place brightly colored tape at end of table or work area as a reminder to always look until you see the tape.  7. Large print will be easier to see. 8. Organize items in baskets and/or with labels so they are easier to see/find 9. Remove clutter 10. Make sure you have good lighting.    Visual/Cognitive Activities: 1. Simple word search--Can look for a single letter 2. Read simple paragraphs out loud to someone 3. With someone with you, look for items in grocery store 4. Work simple jigsaw puzzles 5. Play simple matching games online/tablet that make you search visually. 6.  Play board/card games (memory/matching, solitaire, connect 4, etc.) 7. Dot to Dot 8.  Hidden Pictures

## 2018-11-30 NOTE — Therapy (Signed)
[facility] [address] [place], [place], [address] Phone: [phone]   Fax:  [phone]  Physical Therapy Treatment  Patient Details  Name: [name] MRN: [id number] Date of Birth: 1966/09/22 Referring Provider (PT): Dr. Alger Simons   Encounter Date: 11/29/2018  PT End of Session - 11/30/18 0933    Visit Number  11    Number of Visits  17    Date for PT Re-Evaluation  12/18/18    Authorization Type  [facility] [employer] 08-01-18 - 01-03-19; renewal completed for 8 additional visits 11-17-17 - 12-18-18    Authorization Time Period  08-01-18 - 01-03-19    PT Start Time  0850    PT Stop Time  0932    PT Time Calculation (min)  42 min    Equipment Utilized During Treatment  Gait belt       Past Medical History:  Diagnosis Date  . Diabetes mellitus without complication ([place])   . Seizures ([place])   . TBI (traumatic brain injury) ([place]) 03/2017    Past Surgical History:  Procedure Laterality Date  . CRANIOTOMY Right 01/13/2017   Procedure: 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, Placement of bone flap in abdomen;  Surgeon: Ashok Pall, MD;  Location: [facility];  Service: Neurosurgery;  Laterality: Right;  . CRANIOTOMY Right 03/19/2017   Procedure: Cranioplasty with placement of bone flap from abdomen;  Surgeon: Eustace Moore, MD;  Location: [facility];  Service: Neurosurgery;  Laterality: Right;    There were no vitals filed for this visit.  Subjective Assessment - 11/29/18 0955    Subjective  Pt's sister reports they have been working on jumping and coordination activities at [facility] with patient    Patient is accompained by:  Family member;Interpreter    Pertinent History  TBI secondary to SDH due to fall on 01-10-17; Rt craniectomy for hematoma on 01-13-17; Cranioplasty with placement of bone flap from abdomen on 03-19-17; pt received HH (few visits per sister's report) and no additional  therapies due to lack of insurance at that time;  DM type 2 with peripheral neuropathy;  seizures    Limitations  Other (comment)    Patient Stated Goals  Improve walking and balance    Currently in Pain?  No/denies                       [facility] Adult PT Treatment/Exercise - 11/30/18 0001      Transfers   Transfers  Sit to Stand    Number of Reps  Other reps (comment)   5   Comments  no UE support used       Ambulation/Gait   Ambulation/Gait  Yes    Ambulation/Gait Assistance  5: Supervision    Ambulation/Gait Assistance Details  cues for incr. step length and increased Rt initial heel contact at stance    Ambulation Distance (Feet)  115 Feet    Assistive device  None    Gait Pattern  Step-through pattern    Ambulation Surface  Level;Indoor    Stairs  Yes    Stairs Assistance  5: Supervision    Stairs Assistance Details (indicate cue type and reason)  cues for foot placement w/descension    Stair Management Technique  One rail Right;Forwards;Alternating pattern    Number of Stairs  4    Height of Stairs  6      High Level Balance   High Level Balance  Activities  Backward walking;Tandem walking   ball toss forward and backwards with CGA     NeuroRe-ed; pt performed cone taps with each foot - standing on blue Airex for compliant surface training Alternate tap downs to floor 10 reps each leg with CGA Marching in place on Airex - cues to perform SLOWLY - with CGA Pt performed amb. Forwards and then backwards tossing ball for multi-tasking with gait - CGA - pt had no LOB with either direction (approx. 100' forward; 50' backward) Ladder activity on floor - alternating stepping out/in and then sideways with CGA and demonstrational cues   Balance Exercises - 11/30/18 0932      Balance Exercises: Standing   Standing Eyes Closed  Wide (BOA);Foam/compliant surface;5 reps   with horizontal head turns - 10 reps with CGA     TherAct.; - activities on mini trampoline -  jumping bil. LE's, 1/2 jumping jacks, lunges and then combination of 1/2 jumping jack with alternate lunge With CGA to min assist ;  Trampoline placed at counter for UE support prn - pt initially used counter for support but progressed to performing Activities with min assist on gait belt with no UE support on counter  Pt jogged 230' (2 laps) on track in gym without LOB - SBA for safety        PT Long Term Goals - 11/18/18 1022      PT LONG TERM GOAL #1   Title  Pt will increase DGI score by at least 5 points to demo improved gait.    Time  4    Period  Weeks    Status  On-going    Target Date  12/18/18      PT LONG TERM GOAL #2   Title  Increase distance in 6" walk test by at least 100' for increased endurance and gait speed.    Time  4    Period  Weeks    Status  On-going    Target Date  12/18/18      PT LONG TERM GOAL #3   Title  Pt will negotiate steps with 1 rail only using a step over step sequence; sister will report pt negotiating steps at home in this manner (states he currently hugs the rail with both hands due to fear of falling).     Baseline  sister states he continues to be fearful and hold onto rail with both hands at home but is improving with step negotiation at [facility] - 11-17-18    Time  4    Period  Weeks    Status  Partially Met    Target Date  12/18/18      PT LONG TERM GOAL #4   Title  Increase gait velocity from 3.35 ft/sec to >/= 3.8 ft/sec for increased gait efficiency.     Baseline  8.34 secs = 3.93 ft/sec on 11-17-18    Time  4    Period  Weeks    Status  Achieved      PT LONG TERM GOAL #5   Title  Improve TUG score from 13.58 secs to </= 11 secs for improved functional mobility.      Baseline  16.54, 13.58 secs;  9.00 secs on 11-17-18    Time  4    Period  Weeks    Status  Achieved      PT LONG TERM GOAL #6   Title  Amb. 300' outside on paved uneven surface and on grassy  terrain with supervision without LOB.    Baseline  met 11-17-18     Status  Achieved      PT LONG TERM GOAL #7   Title  Perform HEP with caregiver assistance prn due to cognitive deficits.    Baseline  met 11-17-18    Status  Achieved      PT LONG TERM GOAL #8   Title  Pt will jog 200' on flat even surface with supervision.    Time  4    Period  Weeks    Status  New    Target Date  12/18/18      PT LONG TERM GOAL  #9   TITLE  Pt will demonstrate improved gait pattern with reduced gait deviaitons by amb. with decreased steppage gait RLE with increased arm swing with no cues needed for initial heel contact RLE.    Time  4    Period  Weeks    Status  New    Target Date  12/18/18            Plan - 11/30/18 0934    Clinical Impression Statement  Pt continues to demonstrate improved coordination with high level balance and plyometric activities with improvement noted with repetition and practice & with cues;  jumping ability is improving with some dificulty continuing with Rt foot clearance; most difficulty noted with Rt single limb hopping     Rehab Potential  Good    Clinical Impairments Affecting Rehab Potential  language barrier and cognitive deficits - pt's sister [name] interpreted at eval    PT Frequency  2x / week    PT Duration  4 weeks    PT Treatment/Interventions  ADLs/Self Care Home Management;Aquatic Therapy;Gait training;Stair training;Therapeutic activities;Therapeutic exercise;Balance training;Neuromuscular re-education;Patient/family education    PT Next Visit Plan  continue coordination &  high level balance activities - jumping on mini-trampoline, jogging, etc.     PT Home Exercise Plan   [id number]; added side, forward and back kicks and braiding from [facility] on 10-27-18    Consulted and Agree with Plan of Care  Patient;Family member/caregiver    Family Member Consulted  sister [name]       Patient will benefit from skilled therapeutic intervention in order to improve the following deficits and impairments:  Abnormal gait,  Decreased activity tolerance, Decreased balance, Decreased cognition, Decreased coordination, Decreased strength  Visit Diagnosis: Other abnormalities of gait and mobility  Other lack of coordination  Muscle weakness (generalized)     Problem List Patient Active Problem List   Diagnosis Date Noted  . Hypothyroidism 09/21/2018  . Seizure disorder ([place]) 06/16/2018  . Low testosterone 06/16/2018  . Traumatic brain injury with loss of consciousness ([place]) 10/29/2017  . Anemia 10/29/2017  . Positive depression screening 10/29/2017  . Attention and concentration deficit   . Type 2 diabetes mellitus with peripheral neuropathy ([facility])   . S/P craniotomy 03/19/2017  . Essential hypertension   . Dysphagia, oropharyngeal 02/01/2017  . Diabetes type 2, uncontrolled ([place]) 06/13/2015    Jamyia Fortune, [place], PT 11/30/2018, 9:41 AM  [facility] [address] [place], [place], [address] Phone: [phone]   Fax:  [phone]  Name: Jamair Cato MRN: [id number] Date of Birth: 22-Jun-1966

## 2018-12-02 ENCOUNTER — Encounter: Payer: Self-pay | Admitting: Physical Therapy

## 2018-12-02 ENCOUNTER — Ambulatory Visit: Payer: Self-pay | Admitting: Occupational Therapy

## 2018-12-02 ENCOUNTER — Ambulatory Visit: Payer: Self-pay | Admitting: Physical Therapy

## 2018-12-02 DIAGNOSIS — M6281 Muscle weakness (generalized): Secondary | ICD-10-CM

## 2018-12-02 DIAGNOSIS — R278 Other lack of coordination: Secondary | ICD-10-CM

## 2018-12-02 DIAGNOSIS — R41840 Attention and concentration deficit: Secondary | ICD-10-CM

## 2018-12-02 DIAGNOSIS — R41844 Frontal lobe and executive function deficit: Secondary | ICD-10-CM

## 2018-12-02 DIAGNOSIS — R2689 Other abnormalities of gait and mobility: Secondary | ICD-10-CM

## 2018-12-02 NOTE — Patient Instructions (Signed)
1. Grip Strengthening (Resistive Putty)   Squeeze putty using thumb and all fingers. Repeat _20___ times. Do __2__ sessions per day.   2. Roll putty into tube on table and pinch between each finger and thumb x 10 reps each. (can do ring and small finger together)     Copyright  VHI. All rights reserved.    Lie on back holding wand. Raise arms over head. Hold 5sec. Repeat 10 times per set.  Do 2-3 sessions per day.

## 2018-12-02 NOTE — Therapy (Signed)
[facility] [address] [place], [place], [address] Phone: [phone]   Fax:  [phone]  Physical Therapy Treatment  Patient Details  Name: [name] MRN: [id number] Date of Birth: 11/14/1965 Referring Provider (PT): Dr. Alger Simons   Encounter Date: 12/02/2018  PT End of Session - 12/02/18 1004    Visit Number  12    Number of Visits  17    Date for PT Re-Evaluation  12/18/18    Authorization Type  [facility] [employer] 08-01-18 - 01-03-19; renewal completed for 8 additional visits 11-17-17 - 12-18-18    Authorization Time Period  08-01-18 - 01-03-19    PT Start Time  0805    PT Stop Time  0846    PT Time Calculation (min)  41 min    Equipment Utilized During Treatment  Gait belt    Activity Tolerance  Patient tolerated treatment well    Behavior During Therapy  [facility] for tasks assessed/performed;Flat affect       Past Medical History:  Diagnosis Date  . Diabetes mellitus without complication ([place])   . Seizures ([place])   . TBI (traumatic brain injury) ([place]) 03/2017    Past Surgical History:  Procedure Laterality Date  . CRANIOTOMY Right 01/13/2017   Procedure: 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, Placement of bone flap in abdomen;  Surgeon: Ashok Pall, MD;  Location: [facility];  Service: Neurosurgery;  Laterality: Right;  . CRANIOTOMY Right 03/19/2017   Procedure: Cranioplasty with placement of bone flap from abdomen;  Surgeon: Eustace Moore, MD;  Location: [facility];  Service: Neurosurgery;  Laterality: Right;    There were no vitals filed for this visit.  Subjective Assessment - 12/02/18 0810    Subjective  Pt denies falls or changes.  Did have one lob while stepping up a curb but no fall.    Patient is accompained by:  Family member;Interpreter    Pertinent History  TBI secondary to SDH due to fall on 01-10-17; Rt craniectomy for hematoma on 01-13-17; Cranioplasty with placement  of bone flap from abdomen on 03-19-17; pt received HH (few visits per sister's report) and no additional therapies due to lack of insurance at that time;  DM type 2 with peripheral neuropathy;  seizures    Limitations  Other (comment)    Patient Stated Goals  Improve walking and balance    Currently in Pain?  No/denies            [facility] Adult PT Treatment/Exercise - 12/02/18 0001      Transfers   Transfers  Sit to Stand;Stand to Sit    Sit to Stand  7: Independent    Stand to Sit  7: Independent    Comments  no UE support used       Lumbar Exercises: Aerobic   Elliptical  2 minutes forward and 1 minute backward level 1.5      Lumbar Exercises: Machines for Strengthening   Leg Press  70 lbs x 15 reps with max cues to control weight and for full extension      Knee/Hip Exercises: Standing   Knee Flexion  Strengthening;Both;2 sets;15 reps;Other (comment)   5 lb weight   Hip Flexion  Stengthening;Both;2 sets;15 reps;Knee bent;Other (comment)   5 lb weight   Hip Abduction  Stengthening;Both;2 sets;15 reps;Knee straight;Other (comment)   5lb weight   Hip Extension  Stengthening;Both;2 sets;15 reps;Knee straight   5lb weight   Functional Squat  20 reps;Other (comment)   used mat as target to promote upright trunk& hip flexion         Balance Exercises - 12/02/18 0958      Balance Exercises: Standing   Other Standing Exercises  Floor ladder forward x 6 reps and side stepping x 10 reps             PT Long Term Goals - 11/18/18 1022      PT LONG TERM GOAL #1   Title  Pt will increase DGI score by at least 5 points to demo improved gait.    Time  4    Period  Weeks    Status  On-going    Target Date  12/18/18      PT LONG TERM GOAL #2   Title  Increase distance in 6" walk test by at least 100' for increased endurance and gait speed.    Time  4    Period  Weeks    Status  On-going    Target Date  12/18/18      PT LONG TERM GOAL #3   Title  Pt will negotiate  steps with 1 rail only using a step over step sequence; sister will report pt negotiating steps at home in this manner (states he currently hugs the rail with both hands due to fear of falling).     Baseline  sister states he continues to be fearful and hold onto rail with both hands at home but is improving with step negotiation at [facility] - 11-17-18    Time  4    Period  Weeks    Status  Partially Met    Target Date  12/18/18      PT LONG TERM GOAL #4   Title  Increase gait velocity from 3.35 ft/sec to >/= 3.8 ft/sec for increased gait efficiency.     Baseline  8.34 secs = 3.93 ft/sec on 11-17-18    Time  4    Period  Weeks    Status  Achieved      PT LONG TERM GOAL #5   Title  Improve TUG score from 13.58 secs to </= 11 secs for improved functional mobility.      Baseline  16.54, 13.58 secs;  9.00 secs on 11-17-18    Time  4    Period  Weeks    Status  Achieved      PT LONG TERM GOAL #6   Title  Amb. 300' outside on paved uneven surface and on grassy terrain with supervision without LOB.    Baseline  met 11-17-18    Status  Achieved      PT LONG TERM GOAL #7   Title  Perform HEP with caregiver assistance prn due to cognitive deficits.    Baseline  met 11-17-18    Status  Achieved      PT LONG TERM GOAL #8   Title  Pt will jog 200' on flat even surface with supervision.    Time  4    Period  Weeks    Status  New    Target Date  12/18/18      PT LONG TERM GOAL  #9   TITLE  Pt will demonstrate improved gait pattern with reduced gait deviaitons by amb. with decreased steppage gait RLE with increased arm swing with no cues needed for initial heel contact RLE.    Time  4    Period  Weeks  Status  New    Target Date  12/18/18            Plan - 12/02/18 1005    Clinical Impression Statement  Skilled session focused on balance, stepping strategies, SLS activities and strengthening.  Pt continues to progress well with PT.  continue per POC.    Rehab Potential  Good     Clinical Impairments Affecting Rehab Potential  language barrier and cognitive deficits - pt's sister [name] interpreted at eval    PT Frequency  2x / week    PT Duration  4 weeks    PT Treatment/Interventions  ADLs/Self Care Home Management;Aquatic Therapy;Gait training;Stair training;Therapeutic activities;Therapeutic exercise;Balance training;Neuromuscular re-education;Patient/family education    PT Next Visit Plan  continue coordination &  high level balance activities - jumping on mini-trampoline, jogging, etc.     PT Home Exercise Plan   [id number]; added side, forward and back kicks and braiding from [facility] on 10-27-18    Consulted and Agree with Plan of Care  Patient;Family member/caregiver    Family Member Consulted  sister [name]       Patient will benefit from skilled therapeutic intervention in order to improve the following deficits and impairments:  Abnormal gait, Decreased activity tolerance, Decreased balance, Decreased cognition, Decreased coordination, Decreased strength  Visit Diagnosis: Other abnormalities of gait and mobility  Other lack of coordination  Muscle weakness (generalized)     Problem List Patient Active Problem List   Diagnosis Date Noted  . Hypothyroidism 09/21/2018  . Seizure disorder ([place]) 06/16/2018  . Low testosterone 06/16/2018  . Traumatic brain injury with loss of consciousness ([place]) 10/29/2017  . Anemia 10/29/2017  . Positive depression screening 10/29/2017  . Attention and concentration deficit   . Type 2 diabetes mellitus with peripheral neuropathy ([facility])   . S/P craniotomy 03/19/2017  . Essential hypertension   . Dysphagia, oropharyngeal 02/01/2017  . Diabetes type 2, uncontrolled ([place]) 06/13/2015    Narda Bonds, PTA [place] 12/02/18 10:10 AM Phone: [phone] Fax: [facility] [address] [place],  [place], [address] Phone: [phone]   Fax:  [phone]  Name: Gerik Coberly MRN: [id number] Date of Birth: May 07, 1966

## 2018-12-02 NOTE — Therapy (Signed)
[facility] [address] [place], [place], [address] Phone: [phone]   Fax:  [phone]  Occupational Therapy Treatment  Patient Details  Name: Jamie BeckwithMiguel Garcia Berg MRN: [id number] Date of Birth: Jul 03, 1966 Referring Provider (OT): Dr. Faith RogueZachary Swartz   Encounter Date: 12/02/2018  OT End of Session - 12/02/18 1542    Visit Number  4    Number of Visits  25    Date for OT Re-Evaluation  02/13/19    Authorization Type  [facility] 08/01/18-01/03/19     OT Start Time  0933    OT Stop Time  1014    OT Time Calculation (min)  41 min    Activity Tolerance  Patient tolerated treatment well    Behavior During Therapy  [facility] for tasks assessed/performed;Flat affect       Past Medical History:  Diagnosis Date  . Diabetes mellitus without complication (HCC)   . Seizures (HCC)   . TBI (traumatic brain injury) (HCC) 03/2017    Past Surgical History:  Procedure Laterality Date  . CRANIOTOMY Right 01/13/2017   Procedure: 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, Placement of bone flap in abdomen;  Surgeon: Coletta MemosKyle Cabbell, MD;  [facility];  Service: Neurosurgery;  Laterality: Right;  . CRANIOTOMY Right 03/19/2017   Procedure: Cranioplasty with placement of bone flap from abdomen;  Surgeon: [name], [name], MD;  [facility];  Service: Neurosurgery;  Laterality: Right;    There were no vitals filed for this visit.  Subjective Assessment - 12/02/18 0937    Subjective   Pt reports shoulder pain via interpreter.    Patient Stated Goals  "to be well"    Currently in Pain?  Yes    Pain Score  4     Pain Location  Shoulder    Pain Orientation  Left    Pain Descriptors / Indicators  Aching    Pain Type  Acute pain    Pain Onset  1 to 4 weeks ago    Pain Frequency  Intermittent    Aggravating Factors   unknown    Pain Relieving Factors  unknown            Treatment: Reviewed  coordination HEP, min v.c for performance and to slow down as pt moves very quickly Pt was instructed in red theraputty exercise and supine cane exercise.Placing graded clothespins on vertical antennae with LUE, min difficulty/ v.c Seated closed chain shoulder flexion with min facilitation at left shoulder/ scapula. Arm bike x 5 level 3 for conditioning.               OT Education - 12/02/18 1603    Education Details  shoulder flex supine cane, red putty    Person(s) Educated  Patient;Caregiver(s)   interpreter   Methods  Explanation;Demonstration;Handout;Verbal cues    Comprehension  Verbalized understanding;Returned demonstration;Verbal cues required       OT Short Term Goals - 11/15/18 1147      OT SHORT TERM GOAL #1   Title  Pt will be independent with initial HEP.--check STGs 12/27/18    Time  6    Period  Weeks    Status  New      OT SHORT TERM GOAL #2   Title  Pt will improve L grip strength by at least 5lbs to assit with IADLs.      Baseline  20lbs    Time  6    Period  Weeks    Status  New      OT SHORT TERM GOAL #3   Title  Pt will be improve LUE control/strength to able to performing targeted overhead reaching with LUE x without drops.    Time  6    Period  Weeks    Status  New      OT SHORT TERM GOAL #4   Title  Pt/family will verbalize understanding of cognitive compensation strategies for ADLs/IADLs.    Time  6    Period  Weeks    Status  New      OT SHORT TERM GOAL #5   Title  Pt will attend to functional cognitive tasks for at least 10 min with only min cueing for problem solving.    Time  6    Period  Weeks    Status  New        OT Long Term Goals - 11/15/18 1154      OT LONG TERM GOAL #1   Title  Pt will be independent with updated HEP.--check LTGs 02/13/19    Time  12    Period  Weeks    Status  New      OT LONG TERM GOAL #2   Title  Pt will improve L grip strength by at least 10lbs to assit with IADLs.      Time  12     Period  Weeks    Status  New      OT LONG TERM GOAL #3   Title  Pt will be able to retrieve/replace 3lb object on overhead shelf with LUE x3 with good safety/control.    Time  12    Period  Weeks    Status  New      OT LONG TERM GOAL #4   Title  Pt will perform BADLs and simple selected home maintenance tasks with distant supervision using schedule/strategies prn.    Time  12    Period  Weeks    Status  New      OT LONG TERM GOAL #5   Title  Pt will perform simple snack prep, use microwave, cold meal prep and make appropriate choices mod I.     Time  12    Period  Weeks    Status  New            Plan - 12/02/18 1543    Clinical Impression Statement  Pt is progressing towards goals. He requires v.c to slow down during exercises.    Occupational Profile and client history currently impacting functional performance  Pt was independent and working prior to injury; however, now pt needs set-up/supervision and cueing for ADLs/IADLs.  Pt enjoyed wrestling and soccer prior to injury, but is now unable to engage in these activities.    Occupational performance deficits (Please refer to evaluation for details):  ADL's;IADL's;Leisure;Work;Social Participation    Rehab Potential  Fair    Current Impairments/barriers affecting progress:  length of time since injury, cognitive deficits, Spanish speaking    OT Frequency  2x / week    OT Duration  12 weeks    OT Treatment/Interventions  Self-care/ADL training;Electrical Stimulation;Therapeutic exercise;Visual/perceptual remediation/compensation;Aquatic Therapy;Moist Heat;Paraffin;Neuromuscular education;Patient/family education;Splinting;Fluidtherapy;Energy conservation;Building services engineer;Therapeutic activities;Cryotherapy;Ultrasound;DME and/or AE instruction;Contrast Bath;Manual Therapy;Passive range of motion;Cognitive remediation/compensation;Balance training    Plan  review cane ex in supine and add to them, review red putty     Consulted and Agree with Plan of Care  Family  member/caregiver;Patient;Other (Comment)    Family Member Consulted  sister, interpreter       Patient will benefit from skilled therapeutic intervention in order to improve the following deficits and impairments:  Decreased cognition, Decreased knowledge of use of DME, Impaired vision/preception, Decreased coordination, Abnormal gait, Decreased mobility, Decreased strength, Decreased range of motion, Decreased activity tolerance, Decreased balance, Decreased knowledge of precautions, Decreased safety awareness, Difficulty walking, Impaired perceived functional ability, Impaired UE functional use  Visit Diagnosis: Other lack of coordination  Muscle weakness (generalized)  Frontal lobe and executive function deficit  Attention and concentration deficit    Problem List Patient Active Problem List   Diagnosis Date Noted  . Hypothyroidism 09/21/2018  . Seizure disorder (HCC) 06/16/2018  . Low testosterone 06/16/2018  . Traumatic brain injury with loss of consciousness (HCC) 10/29/2017  . Anemia 10/29/2017  . Positive depression screening 10/29/2017  . Attention and concentration deficit   . Type 2 diabetes mellitus with peripheral neuropathy (HCC)   . S/P craniotomy 03/19/2017  . Essential hypertension   . Dysphagia, oropharyngeal 02/01/2017  . Diabetes type 2, uncontrolled (HCC) 06/13/2015    Doniel Maiello 12/02/2018, 4:04 PM  [facility] [address] [place], [place], [address] Phone: [phone]   Fax:  [phone]  Name: [name] MRN: [id number] Date of Birth: [birth year]

## 2018-12-06 ENCOUNTER — Ambulatory Visit: Payer: Self-pay | Attending: Physical Medicine & Rehabilitation | Admitting: Occupational Therapy

## 2018-12-06 ENCOUNTER — Ambulatory Visit: Payer: Self-pay | Admitting: Physical Therapy

## 2018-12-06 ENCOUNTER — Encounter: Payer: Self-pay | Admitting: Occupational Therapy

## 2018-12-06 DIAGNOSIS — R2689 Other abnormalities of gait and mobility: Secondary | ICD-10-CM

## 2018-12-06 DIAGNOSIS — M6281 Muscle weakness (generalized): Secondary | ICD-10-CM

## 2018-12-06 DIAGNOSIS — R41844 Frontal lobe and executive function deficit: Secondary | ICD-10-CM | POA: Insufficient documentation

## 2018-12-06 DIAGNOSIS — R41840 Attention and concentration deficit: Secondary | ICD-10-CM | POA: Insufficient documentation

## 2018-12-06 DIAGNOSIS — R278 Other lack of coordination: Secondary | ICD-10-CM

## 2018-12-06 DIAGNOSIS — R2681 Unsteadiness on feet: Secondary | ICD-10-CM | POA: Insufficient documentation

## 2018-12-06 NOTE — Patient Instructions (Signed)
   Lie on back holding wand. Raise arms over head. Hold 5sec. Repeat 10 times per set.  Do 2 sessions per day.   ROM: Abduction - Wand   Holding wand with left hand palm up, push wand directly out to side, leading with other hand palm down, until stretch is felt. Hold 5 seconds. Repeat 10 times per set. Do 2 sessions per day. (Lying down)   ROM: Extension - Wand (Standing)   Stand holding wand behind back. Raise arms as far as possible. Repeat 10 times per set.  Do 2 sessions per day.   Press-Up With Wand   Press wand up until elbows are straight, then bring back to chest.  Hold 3 seconds. Repeat 10 times. Do 2 sessions per day.

## 2018-12-06 NOTE — Therapy (Signed)
[facility] [address] [place], [place], [address] Phone: [phone]   Fax:  [phone]  Occupational Therapy Treatment  Patient Details  Name: Jamie BeckwithMiguel Garcia Berg MRN: [id number] Date of Birth: [birth year] Referring Provider (OT): Dr. Faith RogueZachary Swartz   Encounter Date: 12/06/2018  OT End of Session - 12/06/18 0945    Visit Number  5    Number of Visits  25    Date for OT Re-Evaluation  02/13/19    Authorization Type  [facility] 08/01/18-01/03/19     OT Start Time  0935    OT Stop Time  1015    OT Time Calculation (min)  40 min    Activity Tolerance  Patient tolerated treatment well    Behavior During Therapy  [facility] for tasks assessed/performed;Flat affect       Past Medical History:  Diagnosis Date  . Diabetes mellitus without complication (HCC)   . Seizures (HCC)   . TBI (traumatic brain injury) (HCC) 03/2017    Past Surgical History:  Procedure Laterality Date  . CRANIOTOMY Right 01/13/2017   Procedure: 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, Placement of bone flap in abdomen;  Surgeon: Coletta MemosKyle Cabbell, MD;  [facility];  Service: Neurosurgery;  Laterality: Right;  . CRANIOTOMY Right 03/19/2017   Procedure: Cranioplasty with placement of bone flap from abdomen;  Surgeon: [name], [name], MD;  [facility];  Service: Neurosurgery;  Laterality: Right;    There were no vitals filed for this visit.  Subjective Assessment - 12/06/18 0935    Subjective   Pt reports that he hasn't done putty ex at home.  Sister reports that he needs cueing to use schedule.    Patient is accompained by:  Family member;Interpreter    Currently in Pain?  No/denies       Reviewed putty HEP.  Pt needed mod cueing to perform correctly and slow down.   Placing small pegs in pegboard to copy simple design.  Pt copied with mod cueing and mod difficulty with coordination with L hand (R hand assisting  with in-hand manipulation).  Placing Perfection pieces in board for visual-perceptual skills, and L hand coordination.  Pt needed mod cueing for impulsivity and to use L hand.    Discussed use of daily schedule and made recommendations to place where pt sees immediately when he wakes up (pt now able to follow, but needs cues to initiate).  Also recommended pt begin folding washcloths (few initially for practice lining up--has just "wadded" up clothes previously).  Pt/sister verbalized understanding.         OT Education - 12/06/18 1010    Education Details  shoulder flex supine with cane review, added cane ex to HEP (discussed cueing with sister)    Person(s) Educated  Patient;Caregiver(s)   sister, interpreter   Methods  Explanation;Demonstration;Handout;Verbal cues    Comprehension  Verbalized understanding;Returned demonstration;Verbal cues required;Need further instruction   mod cueing      OT Short Term Goals - 11/15/18 1147      OT SHORT TERM GOAL #1   Title  Pt will be independent with initial HEP.--check STGs 12/27/18    Time  6    Period  Weeks    Status  New      OT SHORT TERM GOAL #2   Title  Pt will improve L grip strength by at least 5lbs to assit with IADLs.      Baseline  20lbs  Time  6    Period  Weeks    Status  New      OT SHORT TERM GOAL #3   Title  Pt will be improve LUE control/strength to able to performing targeted overhead reaching with LUE x 5min without drops.    Time  6    Period  Weeks    Status  New      OT SHORT TERM GOAL #4   Title  Pt/family will verbalize understanding of cognitive compensation strategies for ADLs/IADLs.    Time  6    Period  Weeks    Status  New      OT SHORT TERM GOAL #5   Title  Pt will attend to functional cognitive tasks for at least 10 min with only min cueing for problem solving.    Time  6    Period  Weeks    Status  New        OT Long Term Goals - 11/15/18 1154      OT LONG TERM GOAL #1   Title  Pt  will be independent with updated HEP.--check LTGs 02/13/19    Time  12    Period  Weeks    Status  New      OT LONG TERM GOAL #2   Title  Pt will improve L grip strength by at least 10lbs to assit with IADLs.      Time  12    Period  Weeks    Status  New      OT LONG TERM GOAL #3   Title  Pt will be able to retrieve/replace 3lb object on overhead shelf with LUE x3 with good safety/control.    Time  12    Period  Weeks    Status  New      OT LONG TERM GOAL #4   Title  Pt will perform BADLs and simple selected home maintenance tasks with distant supervision using schedule/strategies prn.    Time  12    Period  Weeks    Status  New      OT LONG TERM GOAL #5   Title  Pt will perform simple snack prep, use microwave, cold meal prep and make appropriate choices mod I.     Time  12    Period  Weeks    Status  New            Plan - 12/06/18 [id number]    Clinical Impression Statement  Pt is progressing towards goals. He requires mod v.c to slow down during exercises and perform correctly.    Occupational Profile and client history currently impacting functional performance  Pt was independent and working prior to injury; however, now pt needs set-up/supervision and cueing for ADLs/IADLs.  Pt enjoyed wrestling and soccer prior to injury, but is now unable to engage in these activities.    Occupational performance deficits (Please refer to evaluation for details):  ADL's;IADL's;Leisure;Work;Social Participation    Rehab Potential  Fair    Current Impairments/barriers affecting progress:  length of time since injury, cognitive deficits, Spanish speaking    OT Frequency  2x / week    OT Duration  12 weeks    OT Treatment/Interventions  Self-care/ADL training;Electrical Stimulation;Therapeutic exercise;Visual/perceptual remediation/compensation;Aquatic Therapy;Moist Heat;Paraffin;Neuromuscular education;Patient/family education;Splinting;Fluidtherapy;Energy conservation;Marine scientistunctional Mobility  Training;Therapeutic activities;Cryotherapy;Ultrasound;DME and/or AE instruction;Contrast Bath;Manual Therapy;Passive range of motion;Cognitive remediation/compensation;Balance training    Plan  review cane ex in supine, continue to address functional cognition as able  Consulted and Agree with Plan of Care  Family member/caregiver;Patient;Other (Comment)    Family Member Consulted  sister, interpreter       Patient will benefit from skilled therapeutic intervention in order to improve the following deficits and impairments:  Decreased cognition, Decreased knowledge of use of DME, Impaired vision/preception, Decreased coordination, Abnormal gait, Decreased mobility, Decreased strength, Decreased range of motion, Decreased activity tolerance, Decreased balance, Decreased knowledge of precautions, Decreased safety awareness, Difficulty walking, Impaired perceived functional ability, Impaired UE functional use  Visit Diagnosis: Other lack of coordination  Frontal lobe and executive function deficit  Attention and concentration deficit  Unsteadiness on feet  Other abnormalities of gait and mobility  Muscle weakness (generalized)    Problem List Patient Active Problem List   Diagnosis Date Noted  . Hypothyroidism 09/21/2018  . Seizure disorder (HCC) 06/16/2018  . Low testosterone 06/16/2018  . Traumatic brain injury with loss of consciousness (HCC) 10/29/2017  . Anemia 10/29/2017  . Positive depression screening 10/29/2017  . Attention and concentration deficit   . Type 2 diabetes mellitus with peripheral neuropathy (HCC)   . S/P craniotomy 03/19/2017  . Essential hypertension   . Dysphagia, oropharyngeal 02/01/2017  . Diabetes type 2, uncontrolled (HCC) 06/13/2015    [facility] 12/06/2018, 10:26 AM  [facility] [address] [place], [place], [address] Phone: [phone]   Fax:  [phone]  Name: Paula Zietz MRN: [id number] Date of Birth: April 16, 1966   Willa Frater, OTR/[facility] [address] [place], [place]  [address] [phone] phone [phone] 12/06/18 10:26 AM

## 2018-12-07 ENCOUNTER — Encounter: Payer: Self-pay | Admitting: Physical Therapy

## 2018-12-07 NOTE — Therapy (Signed)
[employer] [address] [place], [place], [address] Phone: [phone]   Fax:  [phone]  Physical Therapy Treatment  Patient Details  Name: [name] MRN: [id number] Date of Birth: September 12, 1966 Referring Provider (PT): Dr. Alger Simons   Encounter Date: 12/06/2018  PT End of Session - 12/07/18 2145    Visit Number  13    Number of Visits  17    Date for PT Re-Evaluation  12/18/18    Authorization Type  [facility] [employer] 08-01-18 - 01-03-19; renewal completed for 8 additional visits 11-17-17 - 12-18-18    Authorization Time Period  08-01-18 - 01-03-19    PT Start Time  0845    PT Stop Time  0931    PT Time Calculation (min)  46 min    Equipment Utilized During Treatment  Gait belt       Past Medical History:  Diagnosis Date  . Diabetes mellitus without complication ([place])   . Seizures ([place])   . TBI (traumatic brain injury) ([place]) 03/2017    Past Surgical History:  Procedure Laterality Date  . CRANIOTOMY Right 01/13/2017   Procedure: 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, Placement of bone flap in abdomen;  Surgeon: Ashok Pall, MD;  Location: [facility];  Service: Neurosurgery;  Laterality: Right;  . CRANIOTOMY Right 03/19/2017   Procedure: Cranioplasty with placement of bone flap from abdomen;  Surgeon: Eustace Moore, MD;  Location: [facility];  Service: Neurosurgery;  Laterality: Right;    There were no vitals filed for this visit.  Subjective Assessment - 12/07/18 2138    Subjective  Pt's sister states pt continues to work out at [employer] - states she gives him time to figure out how to do balance and coordination exercises - says it takes him time to process it but he is able to figure it out with time    Patient is accompained by:  Family member;Interpreter    Pertinent History  TBI secondary to SDH due to fall on 01-10-17; Rt craniectomy for hematoma on 01-13-17; Cranioplasty with  placement of bone flap from abdomen on 03-19-17; pt received HH (few visits per sister's report) and no additional therapies due to lack of insurance at that time;  DM type 2 with peripheral neuropathy;  seizures    Limitations  Other (comment)    Patient Stated Goals  Improve walking and balance    Currently in Pain?  No/denies                       [facility] Adult PT Treatment/Exercise - 12/07/18 0001      Transfers   Transfers  Sit to Stand;Stand to Sit    Sit to Stand  5: Supervision    Comments  feet on blue Airex; no UE support used      Ambulation/Gait   Stairs  Yes    Stairs Assistance  5: Supervision    Stairs Assistance Details (indicate cue type and reason)  cues for foot placement with descension    Stair Management Technique  One rail Right;Forwards;Alternating pattern    Number of Stairs  4    Height of Stairs  6      High Level Balance   High Level Balance Activities  Tandem walking;Backward walking          Balance Exercises - 12/07/18 2142      Balance Exercises: Standing   Standing Eyes Opened  Narrow base of support (BOS);Wide (BOA);Head turns;Foam/compliant surface;5 reps    Standing Eyes Closed  Narrow base of support (BOS);Wide (BOA);Head turns;Foam/compliant surface;5 reps    Other Standing Exercises  pt performed standing on Bosu - squats x 10 reps with min hand held assist          TherAct.; - activities on mini trampoline - jumping bil. LE's, 1/2 jumping jacks, lunges and then combination of 1/2 jumping jack with alternate lunge With CGA to min assist ;   Pt jogged 230' (2 laps) on track in gym without LOB - supervision for safety        PT Long Term Goals - 11/18/18 1022      PT LONG TERM GOAL #1   Title  Pt will increase DGI score by at least 5 points to demo improved gait.    Time  4    Period  Weeks    Status  On-going    Target Date  12/18/18      PT LONG TERM GOAL #2   Title  Increase distance in 6" walk test by at  least 100' for increased endurance and gait speed.    Time  4    Period  Weeks    Status  On-going    Target Date  12/18/18      PT LONG TERM GOAL #3   Title  Pt will negotiate steps with 1 rail only using a step over step sequence; sister will report pt negotiating steps at home in this manner (states he currently hugs the rail with both hands due to fear of falling).     Baseline  sister states he continues to be fearful and hold onto rail with both hands at home but is improving with step negotiation at [facility] - 11-17-18    Time  4    Period  Weeks    Status  Partially Met    Target Date  12/18/18      PT LONG TERM GOAL #4   Title  Increase gait velocity from 3.35 ft/sec to >/= 3.8 ft/sec for increased gait efficiency.     Baseline  8.34 secs = 3.93 ft/sec on 11-17-18    Time  4    Period  Weeks    Status  Achieved      PT LONG TERM GOAL #5   Title  Improve TUG score from 13.58 secs to </= 11 secs for improved functional mobility.      Baseline  16.54, 13.58 secs;  9.00 secs on 11-17-18    Time  4    Period  Weeks    Status  Achieved      PT LONG TERM GOAL #6   Title  Amb. 300' outside on paved uneven surface and on grassy terrain with supervision without LOB.    Baseline  met 11-17-18    Status  Achieved      PT LONG TERM GOAL #7   Title  Perform HEP with caregiver assistance prn due to cognitive deficits.    Baseline  met 11-17-18    Status  Achieved      PT LONG TERM GOAL #8   Title  Pt will jog 200' on flat even surface with supervision.    Time  4    Period  Weeks    Status  New    Target Date  12/18/18      PT LONG TERM GOAL  #9   TITLE  Pt will demonstrate improved gait pattern with reduced gait deviaitons by amb. with decreased steppage gait RLE with increased arm swing with no cues needed for initial heel contact RLE.    Time  4    Period  Weeks    Status  New    Target Date  12/18/18            Plan - 12/07/18 2145    Clinical Impression Statement  Pt  continues to improve with high level balance activities and ballistic activities including jumping activities on mini trampoline.  Pt is progressing towards goals.      Rehab Potential  Good    Clinical Impairments Affecting Rehab Potential  language barrier and cognitive deficits - pt's sister [name] interpreted at eval    PT Frequency  2x / week    PT Duration  4 weeks    PT Treatment/Interventions  ADLs/Self Care Home Management;Aquatic Therapy;Gait training;Stair training;Therapeutic activities;Therapeutic exercise;Balance training;Neuromuscular re-education;Patient/family education    PT Next Visit Plan  continue coordination &  high level balance activities - jumping on mini-trampoline, jogging, etc.     PT Home Exercise Plan   [id number]; added side, forward and back kicks and braiding from [facility] on 10-27-18    Consulted and Agree with Plan of Care  Patient;Family member/caregiver    Family Member Consulted  sister [name]       Patient will benefit from skilled therapeutic intervention in order to improve the following deficits and impairments:  Abnormal gait, Decreased activity tolerance, Decreased balance, Decreased cognition, Decreased coordination, Decreased strength  Visit Diagnosis: Other lack of coordination  Other abnormalities of gait and mobility  Muscle weakness (generalized)     Problem List Patient Active Problem List   Diagnosis Date Noted  . Hypothyroidism 09/21/2018  . Seizure disorder ([place]) 06/16/2018  . Low testosterone 06/16/2018  . Traumatic brain injury with loss of consciousness ([place]) 10/29/2017  . Anemia 10/29/2017  . Positive depression screening 10/29/2017  . Attention and concentration deficit   . Type 2 diabetes mellitus with peripheral neuropathy ([facility])   . S/P craniotomy 03/19/2017  . Essential hypertension   . Dysphagia, oropharyngeal 02/01/2017  . Diabetes type 2, uncontrolled ([place]) 06/13/2015    Naiah Donahoe, [place], PT 12/07/2018, 9:50  PM  [facility] [address] [place], [place], [address] Phone: [phone]   Fax:  [phone]  Name: [name] MRN: [id number] Date of Birth: [birth year]

## 2018-12-08 ENCOUNTER — Ambulatory Visit: Payer: Self-pay | Admitting: Physical Therapy

## 2018-12-08 ENCOUNTER — Ambulatory Visit: Payer: Self-pay | Admitting: Occupational Therapy

## 2018-12-08 DIAGNOSIS — M6281 Muscle weakness (generalized): Secondary | ICD-10-CM

## 2018-12-08 DIAGNOSIS — R278 Other lack of coordination: Secondary | ICD-10-CM

## 2018-12-08 DIAGNOSIS — R41840 Attention and concentration deficit: Secondary | ICD-10-CM

## 2018-12-08 DIAGNOSIS — R2689 Other abnormalities of gait and mobility: Secondary | ICD-10-CM

## 2018-12-08 NOTE — Therapy (Signed)
[facility] [address] [place], [place], [address] Phone: [phone]   Fax:  [phone]  Occupational Therapy Treatment  Patient Details  Name: [name] MRN: [id number] Date of Birth: 12-05-1965 Referring Provider (OT): Dr. Faith Rogue   Encounter Date: 12/08/2018  OT End of Session - 12/08/18 1008    Visit Number  6    Number of Visits  25    Date for OT Re-Evaluation  02/13/19    Authorization Type  [facility] [employer] 08/01/18-01/03/19     OT Start Time  0930    OT Stop Time  1012    OT Time Calculation (min)  42 min    Activity Tolerance  Patient tolerated treatment well    Behavior During Therapy  [facility] for tasks assessed/performed;Flat affect       Past Medical History:  Diagnosis Date  . Diabetes mellitus without complication ([facility])   . Seizures ([facility])   . TBI (traumatic brain injury) ([facility]) 03/2017    Past Surgical History:  Procedure Laterality Date  . CRANIOTOMY Right 01/13/2017   Procedure: 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, Placement of bone flap in abdomen;  Surgeon: Coletta Memos, MD;  Location: [facility] OR;  Service: Neurosurgery;  Laterality: Right;  . CRANIOTOMY Right 03/19/2017   Procedure: Cranioplasty with placement of bone flap from abdomen;  Surgeon: Tia Alert, MD;  Location: [facility] OR;  Service: Neurosurgery;  Laterality: Right;    There were no vitals filed for this visit.  Subjective Assessment - 12/08/18 0933    Subjective   He has a hard time remembering to take all his meds (he takes 2, but forgets his 2 new ones)    Patient is accompained by:  Family member;Interpreter    Patient Stated Goals  "to be well"    Currently in Pain?  No/denies       Discussed strategies for helping pt to take all meds (re: memory deficits and difficulty seeing smaller pills) Reviewed cane HEP w/ cues to slow down. Pt performed each x 10 reps, however therapist had  to cue pt to count as he would keep going Copying small peg design (simple design) w/ Lt hand for coordination, visual scanning, and visual/perceptual skills. Pt w/ 2 small errors.  Pt placing rubber discs on various height prongs LUE w/ 1 lb weight on arm. Pt required cues every time to return to task after going through each sized disc/prong once.                       OT Short Term Goals - 11/15/18 1147      OT SHORT TERM GOAL #1   Title  Pt will be independent with initial HEP.--check STGs 12/27/18    Time  6    Period  Weeks    Status  New      OT SHORT TERM GOAL #2   Title  Pt will improve L grip strength by at least 5lbs to assit with IADLs.      Baseline  20lbs    Time  6    Period  Weeks    Status  New      OT SHORT TERM GOAL #3   Title  Pt will be improve LUE control/strength to able to performing targeted overhead reaching with LUE x without drops.    Time  6    Period  Weeks    Status  New      OT SHORT TERM GOAL #4   Title  Pt/family will verbalize understanding of cognitive compensation strategies for ADLs/IADLs.    Time  6    Period  Weeks    Status  New      OT SHORT TERM GOAL #5   Title  Pt will attend to functional cognitive tasks for at least 10 min with only min cueing for problem solving.    Time  6    Period  Weeks    Status  New        OT Long Term Goals - 11/15/18 1154      OT LONG TERM GOAL #1   Title  Pt will be independent with updated HEP.--check LTGs 02/13/19    Time  12    Period  Weeks    Status  New      OT LONG TERM GOAL #2   Title  Pt will improve L grip strength by at least 10lbs to assit with IADLs.      Time  12    Period  Weeks    Status  New      OT LONG TERM GOAL #3   Title  Pt will be able to retrieve/replace 3lb object on overhead shelf with LUE x3 with good safety/control.    Time  12    Period  Weeks    Status  New      OT LONG TERM GOAL #4   Title  Pt will perform BADLs and simple selected  home maintenance tasks with distant supervision using schedule/strategies prn.    Time  12    Period  Weeks    Status  New      OT LONG TERM GOAL #5   Title  Pt will perform simple snack prep, use microwave, cold meal prep and make appropriate choices mod I.     Time  12    Period  Weeks    Status  New            Plan - 12/08/18 1008    Clinical Impression Statement  Pt is progressing towards goals. He requires mod v.c to slow down during exercises and perform correctly.    Occupational Profile and client history currently impacting functional performance  Pt was independent and working prior to injury; however, now pt needs set-up/supervision and cueing for ADLs/IADLs.  Pt enjoyed wrestling and soccer prior to injury, but is now unable to engage in these activities.    Occupational performance deficits (Please refer to evaluation for details):  ADL's;IADL's;Leisure;Work;Social Participation    Rehab Potential  Fair    OT Frequency  2x / week    OT Duration  12 weeks    OT Treatment/Interventions  Self-care/ADL training;Electrical Stimulation;Therapeutic exercise;Visual/perceptual remediation/compensation;Aquatic Therapy;Moist Heat;Paraffin;Neuromuscular education;Patient/family education;Splinting;Fluidtherapy;Energy conservation;Building services engineerunctional Mobility Training;Therapeutic activities;Cryotherapy;Ultrasound;DME and/or AE instruction;Contrast Bath;Manual Therapy;Passive range of motion;Cognitive remediation/compensation;Balance training    Plan  continue cognition as able, puzzle, ? simple pvc pipe design, organizing cards    Consulted and Agree with Plan of Care  Family member/caregiver;Patient;Other (Comment)    Family Member Consulted  sister, interpreter       Patient will benefit from skilled therapeutic intervention in order to improve the following deficits and impairments:     Visit Diagnosis: Attention and concentration deficit  Other lack of coordination  Muscle weakness  (generalized)    Problem List Patient Active Problem List   Diagnosis Date Noted  . Hypothyroidism 09/21/2018  .  Seizure disorder ([facility]) 06/16/2018  . Low testosterone 06/16/2018  . Traumatic brain injury with loss of consciousness ([facility]) 10/29/2017  . Anemia 10/29/2017  . Positive depression screening 10/29/2017  . Attention and concentration deficit   . Type 2 diabetes mellitus with peripheral neuropathy ([facility])   . S/P craniotomy 03/19/2017  . Essential hypertension   . Dysphagia, oropharyngeal 02/01/2017  . Diabetes type 2, uncontrolled ([facility]) 06/13/2015    Kelli Churn, OTR/L 12/08/2018, 10:30 AM  [facility] [address] [place], [place], [address] Phone: [phone]   Fax:  [phone]  Name: [name] MRN: [id number] Date of Birth: Apr 13, 1966

## 2018-12-09 NOTE — Therapy (Signed)
[facility] [address] [place], [place], [address] Phone: [phone]   Fax:  [phone]  Physical Therapy Treatment  Patient Details  Name: [name] MRN: [id number] Date of Birth: [birth year] Referring Provider (PT): Dr. Alger Simons   Encounter Date: 12/08/2018  PT End of Session - 12/09/18 1757    Visit Number  14    Number of Visits  17    Date for PT Re-Evaluation  12/18/18    Authorization Type  [facility] [employer] 08-01-18 - 01-03-19; renewal completed for 8 additional visits 11-17-17 - 12-18-18    Authorization Time Period  08-01-18 - 01-03-19    PT Start Time  1015    PT Stop Time  1100    PT Time Calculation (min)  45 min       Past Medical History:  Diagnosis Date  . Diabetes mellitus without complication ([place])   . Seizures ([place])   . TBI (traumatic brain injury) ([place]) 03/2017    Past Surgical History:  Procedure Laterality Date  . CRANIOTOMY Right 01/13/2017   Procedure: 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, Placement of bone flap in abdomen;  Surgeon: Ashok Pall, MD;  Location: [facility];  Service: Neurosurgery;  Laterality: Right;  . CRANIOTOMY Right 03/19/2017   Procedure: Cranioplasty with placement of bone flap from abdomen;  Surgeon: Eustace Moore, MD;  Location: [facility];  Service: Neurosurgery;  Laterality: Right;    There were no vitals filed for this visit.  Subjective Assessment - 12/09/18 1754    Subjective  Pt reports no changes since previous PT session this week    Patient is accompained by:  Family member;Interpreter    Pertinent History  TBI secondary to SDH due to fall on 01-10-17; Rt craniectomy for hematoma on 01-13-17; Cranioplasty with placement of bone flap from abdomen on 03-19-17; pt received HH (few visits per sister's report) and no additional therapies due to lack of insurance at that time;  DM type 2 with peripheral neuropathy;  seizures     Limitations  Other (comment)    Patient Stated Goals  Improve walking and balance    Currently in Pain?  No/denies       NeuroRe-ed:  Pt performed trunk/core stabilization exercises on red mat on floor; tall kneeling posiiton - amb. Forwards and backwards 3 reps In tall kneeling;  Lt 1/2 kneeling position with RUE support on mat prn - pt performed horizontal and vertical head turns 5 reps each in this position  1/2 kneeling position - alternate UE flexion (slowly) 5 reps each then reaching up with each hand diagonally with cues to turn head to look up at hand for increased Vestibular input in maintaining balance in 1/2 kneeling position for Rt pelvic and RLE stabilization; Pt performed trunk rotation in tall kneeling - hands clasped together with UE's In 90 degrees of shoulder flexion - moving arms/hands to right and left sides with cues to track hands and maintain balance for trunk stabilization  Pt perfomed crawling forwards/backwards on hands/knees for 3 reps; then progressed to attempting "crab walk" (hands on mat, leaning forward with knees extended) - pt  Had much difficulty with this movement; attempted forward direction only  TherAct:  Performed jumping over brown tiles on track - bil. Feet together - forward and backward approx. 5 reps; pt unable to completely clear both feet from floor together  Pt amb. 115' around track kicking 3 bean bags for improved SLS  and feedforward balance reactions              [facility] Adult PT Treatment/Exercise - 12/09/18 0001      Ambulation/Gait   Gait Comments  Pt performed jogging 115' around track with cues to stay on toes as much as possible rather than placing entire foot down in stance (diffilcult for pt to stay in plantarflexed position)      Pt performed SLS activity of making circles clockwise and counterclockwise with each foot 3 reps each direction with CGA for balance            PT Long Term Goals - 11/18/18 1022       PT LONG TERM GOAL #1   Title  Pt will increase DGI score by at least 5 points to demo improved gait.    Time  4    Period  Weeks    Status  On-going    Target Date  12/18/18      PT LONG TERM GOAL #2   Title  Increase distance in 6" walk test by at least 100' for increased endurance and gait speed.    Time  4    Period  Weeks    Status  On-going    Target Date  12/18/18      PT LONG TERM GOAL #3   Title  Pt will negotiate steps with 1 rail only using a step over step sequence; sister will report pt negotiating steps at home in this manner (states he currently hugs the rail with both hands due to fear of falling).     Baseline  sister states he continues to be fearful and hold onto rail with both hands at home but is improving with step negotiation at [facility] - 11-17-18    Time  4    Period  Weeks    Status  Partially Met    Target Date  12/18/18      PT LONG TERM GOAL #4   Title  Increase gait velocity from 3.35 ft/sec to >/= 3.8 ft/sec for increased gait efficiency.     Baseline  8.34 secs = 3.93 ft/sec on 11-17-18    Time  4    Period  Weeks    Status  Achieved      PT LONG TERM GOAL #5   Title  Improve TUG score from 13.58 secs to </= 11 secs for improved functional mobility.      Baseline  16.54, 13.58 secs;  9.00 secs on 11-17-18    Time  4    Period  Weeks    Status  Achieved      PT LONG TERM GOAL #6   Title  Amb. 300' outside on paved uneven surface and on grassy terrain with supervision without LOB.    Baseline  met 11-17-18    Status  Achieved      PT LONG TERM GOAL #7   Title  Perform HEP with caregiver assistance prn due to cognitive deficits.    Baseline  met 11-17-18    Status  Achieved      PT LONG TERM GOAL #8   Title  Pt will jog 200' on flat even surface with supervision.    Time  4    Period  Weeks    Status  New    Target Date  12/18/18      PT LONG TERM GOAL  #9   TITLE  Pt will demonstrate improved gait pattern  with reduced gait deviaitons by  amb. with decreased steppage gait RLE with increased arm swing with no cues needed for initial heel contact RLE.    Time  4    Period  Weeks    Status  New    Target Date  12/18/18            Plan - 12/09/18 1803    Clinical Impression Statement  Pt is progressing well towards goals; continues to have some difficulty with trunk and pelvis dissociation movements as pt had much difficulty performing tall kneeling to right side sitting; able to partially perform movement after several repetitions with tactile cues.  Pt continues to have difficulty with jumping with both feet together and clearing floor due to continued ataxia with decr. coordination in RLE.      Rehab Potential  Good    Clinical Impairments Affecting Rehab Potential  language barrier and cognitive deficits - pt's sister [name] interpreted at eval    PT Frequency  2x / week    PT Duration  4 weeks    PT Treatment/Interventions  ADLs/Self Care Home Management;Aquatic Therapy;Gait training;Stair training;Therapeutic activities;Therapeutic exercise;Balance training;Neuromuscular re-education;Patient/family education    PT Next Visit Plan  Trunk/core stabilization exercises - in tall kneeling, 1/2 kneeling etc. Continue coordination &  high level balance activities - jumping on mini-trampoline, jogging, etc. Plan to D/C in 2 weeks    PT Home Exercise Plan   [id number]; added side, forward and back kicks and braiding from [facility] on 10-27-18    Consulted and Agree with Plan of Care  Patient;Family member/caregiver    Family Member Consulted  sister [name]       Patient will benefit from skilled therapeutic intervention in order to improve the following deficits and impairments:  Abnormal gait, Decreased activity tolerance, Decreased balance, Decreased cognition, Decreased coordination, Decreased strength  Visit Diagnosis: Other abnormalities of gait and mobility  Muscle weakness (generalized)  Other lack of  coordination     Problem List Patient Active Problem List   Diagnosis Date Noted  . Hypothyroidism 09/21/2018  . Seizure disorder ([place]) 06/16/2018  . Low testosterone 06/16/2018  . Traumatic brain injury with loss of consciousness ([place]) 10/29/2017  . Anemia 10/29/2017  . Positive depression screening 10/29/2017  . Attention and concentration deficit   . Type 2 diabetes mellitus with peripheral neuropathy ([facility])   . S/P craniotomy 03/19/2017  . Essential hypertension   . Dysphagia, oropharyngeal 02/01/2017  . Diabetes type 2, uncontrolled ([place]) 06/13/2015    Nassir Neidert, [place], PT 12/09/2018, 6:09 PM  [facility] [address] [place], [place], [address] Phone: [phone]   Fax:  [phone]  Name: Bohdan Macho MRN: [id number] Date of Birth: 09-28-1966

## 2018-12-12 ENCOUNTER — Encounter: Payer: Self-pay | Admitting: Occupational Therapy

## 2018-12-12 ENCOUNTER — Ambulatory Visit: Payer: Self-pay | Admitting: Physical Therapy

## 2018-12-13 ENCOUNTER — Ambulatory Visit: Payer: Self-pay | Attending: Internal Medicine | Admitting: Internal Medicine

## 2018-12-13 ENCOUNTER — Other Ambulatory Visit: Payer: Self-pay | Admitting: Internal Medicine

## 2018-12-13 ENCOUNTER — Encounter: Payer: Self-pay | Admitting: Internal Medicine

## 2018-12-13 VITALS — BP 136/82 | HR 74 | Temp 98.8°F | Resp 16 | Wt 141.4 lb

## 2018-12-13 DIAGNOSIS — Z7984 Long term (current) use of oral hypoglycemic drugs: Secondary | ICD-10-CM | POA: Insufficient documentation

## 2018-12-13 DIAGNOSIS — Z87820 Personal history of traumatic brain injury: Secondary | ICD-10-CM | POA: Insufficient documentation

## 2018-12-13 DIAGNOSIS — R41840 Attention and concentration deficit: Secondary | ICD-10-CM | POA: Insufficient documentation

## 2018-12-13 DIAGNOSIS — G40409 Other generalized epilepsy and epileptic syndromes, not intractable, without status epilepticus: Secondary | ICD-10-CM | POA: Insufficient documentation

## 2018-12-13 DIAGNOSIS — E119 Type 2 diabetes mellitus without complications: Secondary | ICD-10-CM

## 2018-12-13 DIAGNOSIS — E039 Hypothyroidism, unspecified: Secondary | ICD-10-CM | POA: Insufficient documentation

## 2018-12-13 DIAGNOSIS — R195 Other fecal abnormalities: Secondary | ICD-10-CM | POA: Insufficient documentation

## 2018-12-13 DIAGNOSIS — D649 Anemia, unspecified: Secondary | ICD-10-CM | POA: Insufficient documentation

## 2018-12-13 DIAGNOSIS — Z1211 Encounter for screening for malignant neoplasm of colon: Secondary | ICD-10-CM | POA: Insufficient documentation

## 2018-12-13 DIAGNOSIS — G40909 Epilepsy, unspecified, not intractable, without status epilepticus: Secondary | ICD-10-CM

## 2018-12-13 DIAGNOSIS — I10 Essential (primary) hypertension: Secondary | ICD-10-CM | POA: Insufficient documentation

## 2018-12-13 DIAGNOSIS — E1142 Type 2 diabetes mellitus with diabetic polyneuropathy: Secondary | ICD-10-CM | POA: Insufficient documentation

## 2018-12-13 DIAGNOSIS — Z79899 Other long term (current) drug therapy: Secondary | ICD-10-CM | POA: Insufficient documentation

## 2018-12-13 DIAGNOSIS — R7989 Other specified abnormal findings of blood chemistry: Secondary | ICD-10-CM

## 2018-12-13 LAB — FECAL OCCULT BLOOD, IMMUNOCHEMICAL: IFOBT: NEGATIVE

## 2018-12-13 LAB — GLUCOSE, POCT (MANUAL RESULT ENTRY): POC Glucose: 97 mg/dl (ref 70–99)

## 2018-12-13 MED FILL — ?ATORVASTATIN 10 MG TABLET: 10 | 30 days supply | Qty: 30 | Fill #3

## 2018-12-13 MED FILL — TRUE METRIX TEST STRIP: 25 days supply | Qty: 100 | Fill #0

## 2018-12-13 MED FILL — levETIRAcetam 500 MG TABS: 500 | 30 days supply | Qty: 60 | Fill #4

## 2018-12-13 MED FILL — ?METFORMIN HCL 1000 MG TAB: 1000 | 30 days supply | Qty: 60 | Fill #7

## 2018-12-13 NOTE — Progress Notes (Signed)
Patient ID: [name], male    DOB: 1966/08/04  MRN: [id number]  CC: Diabetes   Subjective: [name] is a 53 y.o. male who presents for chronic ds management. Sister and her husband, [name], are with him. His concerns today include:  Patient is a 53 year old male with history of diabetes type 2,HTN, low testosterone, grand-mal sz and craniotomy due tointracranial bleed, cranial fractures and subdural hematomas2018.  Saw Dr. Naaman Plummer 11/22/2018 in f/u.  Started on low dose Ritalin to help improve intention span.  Sister has notice a little improvement in his attention span.   -pt also getting OT and P.T.  Walking better. Sister tries to keep him on a schedule during the day.  She has him do a few light house chores like washing dishes or taking out the trash.  They also take him to the gym several times a week with him -no sz.  Taking Keppra.  -sister admin testosterone shot  DM:  Sister checks BS daily.  Highest is 132.  Sister tries to fix healthy meals for him but pt still likes sweets.  However they do monitor and control the amount that he is allowed to eat No eye exam as yet  HM: Sister mailed of the fit test but we did not receive it. Patient Active Problem List   Diagnosis Date Noted  . Hypothyroidism 09/21/2018  . Seizure disorder ([place]) 06/16/2018  . Low testosterone 06/16/2018  . Traumatic brain injury with loss of consciousness ([place]) 10/29/2017  . Anemia 10/29/2017  . Positive depression screening 10/29/2017  . Attention and concentration deficit   . Type 2 diabetes mellitus with peripheral neuropathy (HCC)   . S/P craniotomy 03/19/2017  . Essential hypertension   . Dysphagia, oropharyngeal 02/01/2017  . Diabetes type 2, uncontrolled ([place]) 06/13/2015     Current Outpatient Medications on File Prior to Visit  Medication Sig Dispense Refill  . atorvastatin (LIPITOR) 10 MG tablet Take 1 tablet (10 mg total) by mouth daily. 90 tablet 3  .  Blood Glucose Monitoring Suppl (TRUE METRIX METER) w/Device KIT Use as directed 1 kit 0  . cetirizine (ZYRTEC) 10 MG tablet Take 1 tablet (10 mg total) by mouth daily. 30 tablet 11  . glucose blood (TRUE METRIX BLOOD GLUCOSE TEST) test strip Use as instructed 100 each 12  . levETIRAcetam (KEPPRA XR) 500 MG 24 hr tablet Take 2 tablets every night 60 tablet 11  . metFORMIN (GLUCOPHAGE) 1000 MG tablet Take 1 tablet (1,000 mg total) by mouth 2 (two) times daily with a meal. 180 tablet 3  . methylphenidate (RITALIN) 5 MG tablet Take 1 tablet (5 mg total) by mouth 2 (two) times daily with breakfast and lunch. At 0700 and 1200 daily 60 tablet 0  . testosterone cypionate (DEPOTESTOSTERONE CYPIONATE) 200 MG/ML injection Inject 1 mL (200 mg total) into the muscle every 14 (fourteen) days. 10 mL 5  . TRUEPLUS LANCETS 28G MISC Use as directed 100 each 6   No current facility-administered medications on file prior to visit.     Allergies  Allergen Reactions  . Latex Itching and Rash    Social History   Socioeconomic History  . Marital status: Single    Spouse name: Not on file  . Number of children: 1  . Years of education: 17  . Highest education level: High school graduate  Occupational History  . 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 Financial resource strain: Not on file  .  Food insecurity:    Worry: Not on file    Inability: Not on file  . Transportation needs:    Medical: Not on file    Non-medical: Not on file  Tobacco Use  . Smoking status: Never Smoker  . Smokeless tobacco: Never Used  Substance and Sexual Activity  . Alcohol use: Yes    Comment: socially, not since accident  . Drug use: No  . Sexual activity: Not on file  Lifestyle  . Physical activity:    Days per week: Not on file    Minutes per session: Not on file  . Stress: Not on file  Relationships  . Social connections:    Talks on phone: Not on file    Gets together: Not on file    Attends religious  service: Not on file    Active member of club or organization: Not on file    Attends meetings of clubs or organizations: Not on file    Relationship status: Not on file  . Intimate partner violence:    Fear of current or ex partner: Not on file    Emotionally abused: Not on file    Physically abused: Not on file    Forced sexual activity: Not on file  Other Topics Concern  . Not on file  Social History Narrative   Lives with sister and her family.  Has one daughter in [place].  Does not work.  Education: high school.     Family History  Problem Relation Age of Onset  . Diabetes Mother   . Diabetes Father     Past Surgical History:  Procedure Laterality Date  . CRANIOTOMY Right 01/13/2017   Procedure: 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, Placement of bone flap in abdomen;  Surgeon: Ashok Pall, MD;  Location: [facility];  Service: Neurosurgery;  Laterality: Right;  . CRANIOTOMY Right 03/19/2017   Procedure: Cranioplasty with placement of bone flap from abdomen;  Surgeon: Eustace Moore, MD;  Location: [facility];  Service: Neurosurgery;  Laterality: Right;    ROS: Review of Systems Negative except as stated above  PHYSICAL EXAM: BP 136/82   Pulse 74   Temp 98.8 F (37.1 C) (Oral)   Resp 16   Wt 141 lb 6.4 oz (64.1 kg)   SpO2 96%   BMI 26.72 kg/m   Wt Readings from Last 3 Encounters:  12/13/18 141 lb 6.4 oz (64.1 kg)  11/22/18 144 lb (65.3 kg)  09/21/18 145 lb (65.8 kg)                          Physical Exam  General appearance -patient in NAD.  He answers some questions appropriately but sister gives most of the history.   Mental status -flat affect.  Poor eye contact.   Neck - supple, no significant adenopathy Chest - clear to auscultation, no wheezes, rales or rhonchi, symmetric air entry Heart - normal rate, regular rhythm, normal S1, S2, no murmurs, rubs, clicks or gallops Extremities - peripheral pulses normal, no pedal edema, no  clubbing or cyanosis Diabetic Foot Exam - Simple   Simple Foot Form Visual Inspection See comments:  Yes Sensation Testing Intact to touch and monofilament testing bilaterally:  Yes Pulse Check Posterior Tibialis and Dorsalis pulse intact bilaterally:  Yes Comments Toenails thick and discolored but not overgrown.     CMP Latest Ref Rng & Units 06/16/2018 05/10/2018 04/11/2017  Glucose 70 - 99  mg/dL - 154(H) 181(H)  BUN 6 - 20 mg/dL - 14 12  Creatinine 0.61 - 1.24 mg/dL - 0.91 0.64  Sodium 135 - 145 mmol/L - 138 136  Potassium 3.5 - 5.1 mmol/L - 3.8 4.1  Chloride 98 - 111 mmol/L - 103 104  CO2 22 - 32 mmol/L - 26 24  Calcium 8.9 - 10.3 mg/dL - 8.7(L) 8.9  Total Protein 6.0 - 8.5 g/dL 7.0 - -  Total Bilirubin 0.0 - 1.2 mg/dL 0.5 - -  Alkaline Phos 39 - 117 IU/L 54 - -  AST 0 - 40 IU/L 11 - -  ALT 0 - 44 IU/L 15 - -   Lipid Panel     Component Value Date/Time   CHOL 200 (H) 09/09/2018 1156   TRIG 167 (H) 09/09/2018 1156   HDL 50 09/09/2018 1156   CHOLHDL 4.0 09/09/2018 1156   CHOLHDL 5.2 (H) 06/02/2016 1232   VLDL NOT CALC 06/02/2016 1232   LDLCALC 117 (H) 09/09/2018 1156    CBC    Component Value Date/Time   WBC 4.6 09/09/2018 1156   WBC 5.5 05/10/2018 1849   RBC 4.44 09/09/2018 1156   RBC 3.96 (L) 05/10/2018 1849   HGB 13.2 09/09/2018 1156   HCT 38.6 09/09/2018 1156   PLT 232 09/09/2018 1156   MCV 87 09/09/2018 1156   MCH 29.7 09/09/2018 1156   MCH 30.6 05/10/2018 1849   MCHC 34.2 09/09/2018 1156   MCHC 35.6 05/10/2018 1849   RDW 12.6 09/09/2018 1156   LYMPHSABS 1.1 05/10/2018 1849   LYMPHSABS 1.4 04/01/2017 1444   MONOABS 0.3 05/10/2018 1849   EOSABS 0.1 05/10/2018 1849   EOSABS 0.1 04/01/2017 1444   BASOSABS 0.0 05/10/2018 1849   BASOSABS 0.0 04/01/2017 1444    ASSESSMENT AND PLAN:  1. Controlled type 2 diabetes mellitus without complication, without long-term current use of insulin (HCC) Continue metformin. Encourage him to avoid eating too many  sweets snacks and eat more fruits. - POCT glucose (manual entry) - Microalbumin / creatinine urine ratio  2. Colon cancer screening Sister agreeable to having him do the fit test again - Fecal occult blood, imunochemical([facility])  3. Seizure disorder ([place]) Continue Keppra  4. History of traumatic brain injury Currently doing physical therapy and Occupational Therapy with some improvement in function  5. Low testosterone Receiving testosterone injection twice a month    Patient was given the opportunity to ask questions.  Patient verbalized understanding of the plan and was able to repeat key elements of the plan.   Orders Placed This Encounter  Procedures  . Fecal occult blood, imunochemical([facility])  . Microalbumin / creatinine urine ratio  . POCT glucose (manual entry)     Requested Prescriptions    No prescriptions requested or ordered in this encounter    Return in about 4 months (around 04/13/2019).  Karle Plumber, MD, FACP

## 2018-12-14 ENCOUNTER — Ambulatory Visit: Payer: Self-pay | Admitting: Occupational Therapy

## 2018-12-14 ENCOUNTER — Ambulatory Visit: Payer: Self-pay | Admitting: Physical Therapy

## 2018-12-14 DIAGNOSIS — M6281 Muscle weakness (generalized): Secondary | ICD-10-CM

## 2018-12-14 DIAGNOSIS — R2689 Other abnormalities of gait and mobility: Secondary | ICD-10-CM

## 2018-12-14 DIAGNOSIS — R41844 Frontal lobe and executive function deficit: Secondary | ICD-10-CM

## 2018-12-14 DIAGNOSIS — R278 Other lack of coordination: Secondary | ICD-10-CM

## 2018-12-14 DIAGNOSIS — R41840 Attention and concentration deficit: Secondary | ICD-10-CM

## 2018-12-14 LAB — MICROALBUMIN / CREATININE URINE RATIO
CREATININE, UR: 158.1 mg/dL
Microalb/Creat Ratio: 162 mg/g creat — ABNORMAL HIGH (ref 0–29)
Microalbumin, Urine: 255.5 ug/mL

## 2018-12-14 NOTE — Therapy (Signed)
[facility] [place] [facility] [address] [place], [place], [address] Phone: [phone]   Fax:  [phone]  Occupational Therapy Treatment  Patient Details  Name: [name] MRN: [id number] Date of Birth: [birth year] Referring Provider (OT): Dr. Faith Rogue   Encounter Date: 12/14/2018  OT End of Session - 12/14/18 1123    Visit Number  7    Number of Visits  25    Date for OT Re-Evaluation  02/13/19    Authorization Type  [facility] 08/01/18-01/03/19     OT Start Time  0940   Pt in BR   OT Stop Time  1014    OT Time Calculation (min)  34 min    Activity Tolerance  Patient tolerated treatment well    Behavior During Therapy  [facility] for tasks assessed/performed;Flat affect       Past Medical History:  Diagnosis Date  . Diabetes mellitus without complication (HCC)   . Seizures (HCC)   . TBI (traumatic brain injury) (HCC) 03/2017    Past Surgical History:  Procedure Laterality Date  . CRANIOTOMY Right 01/13/2017   Procedure: 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, Placement of bone flap in abdomen;  Surgeon: Coletta Memos, MD;  [facility];  Service: Neurosurgery;  Laterality: Right;  . CRANIOTOMY Right 03/19/2017   Procedure: Cranioplasty with placement of bone flap from abdomen;  Surgeon: Tia Alert, MD;  Location: [facility] OR;  Service: Neurosurgery;  Laterality: Right;    There were no vitals filed for this visit.  Subjective Assessment - 12/14/18 1126    Subjective   No reports of pain    Patient Stated Goals  "to be well"    Currently in Pain?  No/denies              Treatment; Completing 12 piece puzzle for visual and cognitive skills, mod v.c for organization Pipe tree design, basic, mod / max v.c for locating correct pieces and orientation. copying small peg design mod complex, mod v.c for correct design, and use of in hand manipulation for  removing pegs from  pegboard. Pt requires v.c throughout task to slow down.               OT Short Term Goals - 11/15/18 1147      OT SHORT TERM GOAL #1   Title  Pt will be independent with initial HEP.--check STGs 12/27/18    Time  6    Period  Weeks    Status  New      OT SHORT TERM GOAL #2   Title  Pt will improve L grip strength by at least 5lbs to assit with IADLs.      Baseline  20lbs    Time  6    Period  Weeks    Status  New      OT SHORT TERM GOAL #3   Title  Pt will be improve LUE control/strength to able to performing targeted overhead reaching with LUE x without drops.    Time  6    Period  Weeks    Status  New      OT SHORT TERM GOAL #4   Title  Pt/family will verbalize understanding of cognitive compensation strategies for ADLs/IADLs.    Time  6    Period  Weeks    Status  New      OT SHORT TERM GOAL #5   Title  Pt will attend  to functional cognitive tasks for at least 10 min with only min cueing for problem solving.    Time  6    Period  Weeks    Status  New        OT Long Term Goals - 11/15/18 1154      OT LONG TERM GOAL #1   Title  Pt will be independent with updated HEP.--check LTGs 02/13/19    Time  12    Period  Weeks    Status  New      OT LONG TERM GOAL #2   Title  Pt will improve L grip strength by at least 10lbs to assit with IADLs.      Time  12    Period  Weeks    Status  New      OT LONG TERM GOAL #3   Title  Pt will be able to retrieve/replace 3lb object on overhead shelf with LUE x3 with good safety/control.    Time  12    Period  Weeks    Status  New      OT LONG TERM GOAL #4   Title  Pt will perform BADLs and simple selected home maintenance tasks with distant supervision using schedule/strategies prn.    Time  12    Period  Weeks    Status  New      OT LONG TERM GOAL #5   Title  Pt will perform simple snack prep, use microwave, cold meal prep and make appropriate choices mod I.     Time  12    Period  Weeks    Status  New             Plan - 12/14/18 1124    Clinical Impression Statement  Pt is progressing towards goals, however he remains limited by impulsivity requiring v.c to slow down.    Occupational Profile and client history currently impacting functional performance  Pt was independent and working prior to injury; however, now pt needs set-up/supervision and cueing for ADLs/IADLs.  Pt enjoyed wrestling and soccer prior to injury, but is now unable to engage in these activities.    Occupational performance deficits (Please refer to evaluation for details):  ADL's;IADL's;Leisure;Work;Social Participation    Rehab Potential  Fair    Current Impairments/barriers affecting progress:  length of time since injury, cognitive deficits, Spanish speaking    OT Frequency  2x / week    OT Duration  12 weeks    OT Treatment/Interventions  Self-care/ADL training;Electrical Stimulation;Therapeutic exercise;Visual/perceptual remediation/compensation;Aquatic Therapy;Moist Heat;Paraffin;Neuromuscular education;Patient/family education;Splinting;Fluidtherapy;Energy conservation;Building services engineerunctional Mobility Training;Therapeutic activities;Cryotherapy;Ultrasound;DME and/or AE instruction;Contrast Bath;Manual Therapy;Passive range of motion;Cognitive remediation/compensation;Balance training    Plan  continue to address cogntion, problem solving    Consulted and Agree with Plan of Care  Family member/caregiver;Patient;Other (Comment)    Family Member Consulted  sister, interpreter       Patient will benefit from skilled therapeutic intervention in order to improve the following deficits and impairments:     Visit Diagnosis: Other abnormalities of gait and mobility  Muscle weakness (generalized)  Other lack of coordination  Attention and concentration deficit  Frontal lobe and executive function deficit    Problem List Patient Active Problem List   Diagnosis Date Noted  . Hypothyroidism 09/21/2018  . Seizure disorder  (HCC) 06/16/2018  . Low testosterone 06/16/2018  . Traumatic brain injury with loss of consciousness (HCC) 10/29/2017  . Anemia 10/29/2017  . Positive depression screening 10/29/2017  . Attention and  concentration deficit   . Type 2 diabetes mellitus with peripheral neuropathy (HCC)   . S/P craniotomy 03/19/2017  . Essential hypertension   . Dysphagia, oropharyngeal 02/01/2017  . Diabetes type 2, uncontrolled (HCC) 06/13/2015    , 12/14/2018, 11:26 AM  [facility] [address] [place], [place], [address] Phone: [phone]   Fax:  [phone]  Name: [name] MRN: [id number] Date of Birth: October 05, 1966

## 2018-12-14 NOTE — Therapy (Signed)
[facility] [address] [place], [place], [address] Phone: [phone]   Fax:  [phone]  Physical Therapy Treatment  Patient Details  Name: [name] MRN: [id number] Date of Birth: [birth year] Referring Provider (PT): Dr. Alger Simons   Encounter Date: 12/14/2018  PT End of Session - 12/14/18 1717    Visit Number  15    Number of Visits  17    Date for PT Re-Evaluation  12/18/18    Authorization Type  [facility] 08-01-18 - 01-03-19; renewal completed for 8 additional visits 11-17-17 - 12-18-18    Authorization Time Period  08-01-18 - 01-03-19    PT Start Time  0848    PT Stop Time  0930    PT Time Calculation (min)  42 min    Activity Tolerance  Patient tolerated treatment well    Behavior During Therapy  [facility] for tasks assessed/performed;Flat affect       Past Medical History:  Diagnosis Date  . Diabetes mellitus without complication ([place])   . Seizures ([place])   . TBI (traumatic brain injury) ([place]) 03/2017    Past Surgical History:  Procedure Laterality Date  . CRANIOTOMY Right 01/13/2017   Procedure: 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, Placement of bone flap in abdomen;  Surgeon: Ashok Pall, MD;  Location: [facility];  Service: Neurosurgery;  Laterality: Right;  . CRANIOTOMY Right 03/19/2017   Procedure: Cranioplasty with placement of bone flap from abdomen;  Surgeon: Eustace Moore, MD;  Location: [facility];  Service: Neurosurgery;  Laterality: Right;    There were no vitals filed for this visit.  Subjective Assessment - 12/14/18 1716    Subjective  Denies falls or changes since last session.  Family continues to see improvements.    Patient is accompained by:  Family member;Interpreter    Pertinent History  TBI secondary to SDH due to fall on 01-10-17; Rt craniectomy for hematoma on 01-13-17; Cranioplasty with placement of bone flap from abdomen on 03-19-17; pt received HH  (few visits per sister's report) and no additional therapies due to lack of insurance at that time;  DM type 2 with peripheral neuropathy;  seizures    Limitations  Other (comment)    Patient Stated Goals  Improve walking and balance    Currently in Pain?  No/denies       NeuroRe-ed:  Pt performed trunk/core stabilization exercises on red mat on floor; tall kneeling posiiton.  Moving from tall kneeling to sitting back on heels x 15 reps with cues for contol.  1/2 kneeling position - alternate UE flexion (slowly) 10 reps each.  Transition stepping from kneeling to 1/2 kneeling alternating LE's x 10 each side with cues for stabilization at trunk and pelvis.  In quadruped, alternating UE lifts, alternating LE's lifts then opposite arm/leg lifts.  Pt needs tactile and min assist at times for stabilization on equal weight bearing.    Pt perfomed crawling forwards/backwards on hands/knees for 6 reps;   Taps to cones alternating LE's.  Pt with decreased SLS on R side.  Taps to 6" step alternating LE's then standing with LLE on step to encourage weight bearing thru RLE.  Needed min assist to maintain weight shift over RLE. Pt amb. 115' around track kicking 3 bean bags for improved SLS and balance reactions.      [facility] Adult PT Treatment/Exercise - 12/09/18 0001        Ambulation/Gait   Gait Comments  Pt performed jogging 230'' around track with cues to stay on toes as much as possible rather than placing entire foot down in stance (diffilcult for pt to stay in plantarflexed position)  Also performed jogging on track working on normal gait pattern.  Jogged 230' x 1 and 115' x 1         PT Long Term Goals - 11/18/18 1022      PT LONG TERM GOAL #1   Title  Pt will increase DGI score by at least 5 points to demo improved gait.    Time  4    Period  Weeks    Status  On-going    Target Date  12/18/18      PT LONG TERM GOAL #2   Title  Increase distance in 6" walk test by at least 100'  for increased endurance and gait speed.    Time  4    Period  Weeks    Status  On-going    Target Date  12/18/18      PT LONG TERM GOAL #3   Title  Pt will negotiate steps with 1 rail only using a step over step sequence; sister will report pt negotiating steps at home in this manner (states he currently hugs the rail with both hands due to fear of falling).     Baseline  sister states he continues to be fearful and hold onto rail with both hands at home but is improving with step negotiation at [facility] - 11-17-18    Time  4    Period  Weeks    Status  Partially Met    Target Date  12/18/18      PT LONG TERM GOAL #4   Title  Increase gait velocity from 3.35 ft/sec to >/= 3.8 ft/sec for increased gait efficiency.     Baseline  8.34 secs = 3.93 ft/sec on 11-17-18    Time  4    Period  Weeks    Status  Achieved      PT LONG TERM GOAL #5   Title  Improve TUG score from 13.58 secs to </= 11 secs for improved functional mobility.      Baseline  16.54, 13.58 secs;  9.00 secs on 11-17-18    Time  4    Period  Weeks    Status  Achieved      PT LONG TERM GOAL #6   Title  Amb. 300' outside on paved uneven surface and on grassy terrain with supervision without LOB.    Baseline  met 11-17-18    Status  Achieved      PT LONG TERM GOAL #7   Title  Perform HEP with caregiver assistance prn due to cognitive deficits.    Baseline  met 11-17-18    Status  Achieved      PT LONG TERM GOAL #8   Title  Pt will jog 200' on flat even surface with supervision.    Time  4    Period  Weeks    Status  New    Target Date  12/18/18      PT LONG TERM GOAL  #9   TITLE  Pt will demonstrate improved gait pattern with reduced gait deviaitons by amb. with decreased steppage gait RLE with increased arm swing with no cues needed for initial heel contact RLE.    Time  4    Period  Weeks    Status  New  Target Date  12/18/18            Plan - 12/14/18 1718    Clinical Impression Statement  Skilled  session focused on strengthening, coordination and high level balance.  Continues to progress toward goals.  Continue PT per POC.    Rehab Potential  Good    Clinical Impairments Affecting Rehab Potential  language barrier and cognitive deficits - pt's sister [name] interpreted at eval    PT Frequency  2x / week    PT Duration  4 weeks    PT Treatment/Interventions  ADLs/Self Care Home Management;Aquatic Therapy;Gait training;Stair training;Therapeutic activities;Therapeutic exercise;Balance training;Neuromuscular re-education;Patient/family education    PT Next Visit Plan  Discuss discharge plan with patient and sister.  Trunk/core stabilization exercises - in tall kneeling, 1/2 kneeling etc. Continue coordination &  high level balance activities - jumping on mini-trampoline, jogging, etc. Plan to D/C in 2 weeks    PT Home Exercise Plan   [id number]; added side, forward and back kicks and braiding from [facility] on 10-27-18    Consulted and Agree with Plan of Care  Patient;Family member/caregiver    Family Member Consulted  sister [name]       Patient will benefit from skilled therapeutic intervention in order to improve the following deficits and impairments:  Abnormal gait, Decreased activity tolerance, Decreased balance, Decreased cognition, Decreased coordination, Decreased strength  Visit Diagnosis: Other abnormalities of gait and mobility  Muscle weakness (generalized)  Other lack of coordination     Problem List Patient Active Problem List   Diagnosis Date Noted  . Hypothyroidism 09/21/2018  . Seizure disorder ([place]) 06/16/2018  . Low testosterone 06/16/2018  . Traumatic brain injury with loss of consciousness ([place]) 10/29/2017  . Anemia 10/29/2017  . Positive depression screening 10/29/2017  . Attention and concentration deficit   . Type 2 diabetes mellitus with peripheral neuropathy (HCC)   . S/P craniotomy 03/19/2017  . Essential hypertension   . Dysphagia, oropharyngeal  02/01/2017  . Diabetes type 2, uncontrolled ([place]) 06/13/2015   Narda Bonds, PTA [place] 12/14/18 5:34 PM Phone: [phone] Fax: [facility] [address] [place], [place], [address] Phone: [phone]   Fax:  [phone]  Name: Josehua Hammar MRN: [id number] Date of Birth: 16-Aug-1966

## 2018-12-15 ENCOUNTER — Other Ambulatory Visit: Payer: Self-pay | Admitting: Internal Medicine

## 2018-12-15 ENCOUNTER — Ambulatory Visit: Payer: Self-pay | Admitting: Physical Therapy

## 2018-12-15 ENCOUNTER — Ambulatory Visit: Payer: Self-pay | Admitting: Occupational Therapy

## 2018-12-15 DIAGNOSIS — R809 Proteinuria, unspecified: Secondary | ICD-10-CM | POA: Insufficient documentation

## 2018-12-15 DIAGNOSIS — R2689 Other abnormalities of gait and mobility: Secondary | ICD-10-CM

## 2018-12-15 DIAGNOSIS — R2681 Unsteadiness on feet: Secondary | ICD-10-CM

## 2018-12-15 DIAGNOSIS — R41840 Attention and concentration deficit: Secondary | ICD-10-CM

## 2018-12-15 DIAGNOSIS — M6281 Muscle weakness (generalized): Secondary | ICD-10-CM

## 2018-12-15 DIAGNOSIS — R41844 Frontal lobe and executive function deficit: Secondary | ICD-10-CM

## 2018-12-15 DIAGNOSIS — R278 Other lack of coordination: Secondary | ICD-10-CM

## 2018-12-15 MED ORDER — LISINOPRIL 5 MG PO TABS: 2.5000 mg | ORAL_TABLET | Freq: Every day | ORAL | 1 refills | Status: DC

## 2018-12-15 MED FILL — LISINOPRIL 5 MG TAB: 5 | 30 days supply | Qty: 15 | Fill #0

## 2018-12-15 NOTE — Therapy (Signed)
[facility] [address] [place], [place], [address] Phone: [phone]   Fax:  [phone]  Occupational Therapy Treatment  Patient Details  Name: [name] MRN: [id number] Date of Birth: 1965/11/04 Referring Provider (OT): Dr. Faith Rogue   Encounter Date: 12/15/2018  OT End of Session - 12/15/18 0940    Visit Number  8    Number of Visits  25    Date for OT Re-Evaluation  02/13/19    Authorization Type  [facility] 08/01/18-01/03/19     OT Start Time  0937    OT Stop Time  1015    OT Time Calculation (min)  38 min       Past Medical History:  Diagnosis Date  . Diabetes mellitus without complication (HCC)   . Seizures (HCC)   . TBI (traumatic brain injury) (HCC) 03/2017    Past Surgical History:  Procedure Laterality Date  . CRANIOTOMY Right 01/13/2017   Procedure: 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, Placement of bone flap in abdomen;  Surgeon: Coletta Memos, MD;  Location: [facility] OR;  Service: Neurosurgery;  Laterality: Right;  . CRANIOTOMY Right 03/19/2017   Procedure: Cranioplasty with placement of bone flap from abdomen;  Surgeon: Tia Alert, MD;  Location: [facility] OR;  Service: Neurosurgery;  Laterality: Right;    There were no vitals filed for this visit.  Subjective Assessment - 12/15/18 1207    Patient Stated Goals  "to be well"    Currently in Pain?  No/denies              Treatment Reviewed red putty exercise for sustained grip and pinch, min-mod v.c, pulling small pegs from putty with LUE, min-mod v.c Standing to place/ remove graded clothes pins from antennae with LUe for sustained pinch, min v.c Completing 12 piece puzzle with min v.c for organization and increased time. arm bike x 6 mins for conditioning, min v.c for speed                 OT Short Term Goals - 11/15/18 1147      OT SHORT TERM GOAL #1   Title  Pt will be  independent with initial HEP.--check STGs 12/27/18    Time  6    Period  Weeks    Status  New      OT SHORT TERM GOAL #2   Title  Pt will improve L grip strength by at least 5lbs to assit with IADLs.      Baseline  20lbs    Time  6    Period  Weeks    Status  New      OT SHORT TERM GOAL #3   Title  Pt will be improve LUE control/strength to able to performing targeted overhead reaching with LUE x without drops.    Time  6    Period  Weeks    Status  New      OT SHORT TERM GOAL #4   Title  Pt/family will verbalize understanding of cognitive compensation strategies for ADLs/IADLs.    Time  6    Period  Weeks    Status  New      OT SHORT TERM GOAL #5   Title  Pt will attend to functional cognitive tasks for at least 10 min with only min cueing for problem solving.    Time  6    Period  Weeks    Status  New        OT Long Term Goals - 11/15/18 1154      OT LONG TERM GOAL #1   Title  Pt will be independent with updated HEP.--check LTGs 02/13/19    Time  12    Period  Weeks    Status  New      OT LONG TERM GOAL #2   Title  Pt will improve L grip strength by at least 10lbs to assit with IADLs.      Time  12    Period  Weeks    Status  New      OT LONG TERM GOAL #3   Title  Pt will be able to retrieve/replace 3lb object on overhead shelf with LUE x3 with good safety/control.    Time  12    Period  Weeks    Status  New      OT LONG TERM GOAL #4   Title  Pt will perform BADLs and simple selected home maintenance tasks with distant supervision using schedule/strategies prn.    Time  12    Period  Weeks    Status  New      OT LONG TERM GOAL #5   Title  Pt will perform simple snack prep, use microwave, cold meal prep and make appropriate choices mod I.     Time  12    Period  Weeks    Status  New            Plan - 12/15/18 1208    Clinical Impression Statement  Pt is proressing towards goals. He continues to be limited by impulsivity.    Occupational  Profile and client history currently impacting functional performance  Pt was independent and working prior to injury; however, now pt needs set-up/supervision and cueing for ADLs/IADLs.  Pt enjoyed wrestling and soccer prior to injury, but is now unable to engage in these activities.    Occupational performance deficits (Please refer to evaluation for details):  ADL's;IADL's;Leisure;Work;Social Participation    Rehab Potential  Fair    Current Impairments/barriers affecting progress:  length of time since injury, cognitive deficits, Spanish speaking    OT Frequency  2x / week    OT Duration  12 weeks    OT Treatment/Interventions  Self-care/ADL training;Electrical Stimulation;Therapeutic exercise;Visual/perceptual remediation/compensation;Aquatic Therapy;Moist Heat;Paraffin;Neuromuscular education;Patient/family education;Splinting;Fluidtherapy;Energy conservation;Building services engineerunctional Mobility Training;Therapeutic activities;Cryotherapy;Ultrasound;DME and/or AE instruction;Contrast Bath;Manual Therapy;Passive range of motion;Cognitive remediation/compensation;Balance training    Plan  continue to address cogntion, problem solving    Consulted and Agree with Plan of Care  Family member/caregiver;Patient;Other (Comment)    Family Member Consulted  sister, interpreter       Patient will benefit from skilled therapeutic intervention in order to improve the following deficits and impairments:  Decreased cognition, Decreased knowledge of use of DME, Impaired vision/preception, Decreased coordination, Abnormal gait, Decreased mobility, Decreased strength, Decreased range of motion, Decreased activity tolerance, Decreased balance, Decreased knowledge of precautions, Decreased safety awareness, Difficulty walking, Impaired perceived functional ability, Impaired UE functional use  Visit Diagnosis: Muscle weakness (generalized)  Other lack of coordination  Attention and concentration deficit  Frontal lobe and  executive function deficit  Unsteadiness on feet    Problem List Patient Active Problem List   Diagnosis Date Noted  . Microalbuminuria 12/15/2018  . Hypothyroidism 09/21/2018  . Seizure disorder (HCC) 06/16/2018  . Low testosterone 06/16/2018  . Traumatic brain injury with loss of consciousness (HCC) 10/29/2017  . Anemia 10/29/2017  . Positive depression  screening 10/29/2017  . Attention and concentration deficit   . Type 2 diabetes mellitus with peripheral neuropathy (HCC)   . S/P craniotomy 03/19/2017  . Essential hypertension   . Dysphagia, oropharyngeal 02/01/2017  . Diabetes type 2, uncontrolled (HCC) 06/13/2015    Mark Hassey 12/15/2018, 12:09 PM  [facility] [address] [place], [place], [address] Phone: [phone]   Fax:  [phone]  Name: Jamie BeckwithMiguel Garcia Berg MRN: [id number] Date of Birth: [birth year]

## 2018-12-16 NOTE — Patient Instructions (Signed)
  Quadruped: Hip Extension / Knee Flexion (Active)    On hands and knees, lift right leg with knee straight. Bend knee.  NO WEIGHT TO BE USED Complete _2_ sets of __10_ repetitions. Perform _1__ sessions per day.     Quadruped: Hip Extension / Knee Extension (Active)    On hands and knees, lift right leg with knee straight.  Complete _2__ sets of _10__ repetitions. Perform _1__ sessions per day. NO WEIGHT TO BE USED - TRY TO HOLD LEG UP - BEND AND STRAIGHTEN KNEE WHILE HOLDING LEG UP STRAIGHT   Upper / Lower Extremity Extension (All-Fours)    Contraiga el abdomen y eleve la pierna derecha y el brazo opuesto. Mantenga el tronco rgido. Repita __5__ veces por rutina. Realice _1-2___ Carlis Stable por sesin. Realice _1___ sesiones por da.  [email or web address]

## 2018-12-16 NOTE — Therapy (Signed)
[facility] [address] [place], [place], [address] Phone: [phone]   Fax:  [phone]  Physical Therapy Treatment  Patient Details  Name: [name] MRN: [id number] Date of Birth: 08/12/1966 Referring Provider (PT): Dr. [name]   Encounter Date: 12/15/2018  PT End of Session - 12/16/18 1250    Visit Number  16    Number of Visits  17    Date for PT Re-Evaluation  12/18/18    Authorization Type  [facility] 08-01-18 - 01-03-19; renewal completed for 8 additional visits 11-17-17 - 12-18-18    Authorization Time Period  08-01-18 - 01-03-19    PT Start Time  1022    PT Stop Time  1106    PT Time Calculation (min)  44 min    Equipment Utilized During Treatment  Gait belt       Past Medical History:  Diagnosis Date  . Diabetes mellitus without complication (HCC)   . Seizures (HCC)   . TBI (traumatic brain injury) (HCC) 03/2017    Past Surgical History:  Procedure Laterality Date  . CRANIOTOMY Right 01/13/2017   Procedure: 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, Placement of bone flap in abdomen;  Surgeon: [name], MD;  Location: [facility] OR;  Service: Neurosurgery;  Laterality: Right;  . CRANIOTOMY Right 03/19/2017   Procedure: Cranioplasty with placement of bone flap from abdomen;  Surgeon: Jones, [name], MD;  Location: [facility] OR;  Service: Neurosurgery;  Laterality: Right;    There were no vitals filed for this visit.  Subjective Assessment - 12/16/18 1247    Subjective  Discuss plan to D/C vs. renew with pt and his sister - sister reports pt has made much progress since starting PT and wants him to continue if possible    Patient is accompained by:  Family member;Interpreter    Patient Stated Goals  Improve walking and balance    Currently in Pain?  No/denies           NeuroRe-ed; pt transferred standing to floor to quadruped position - performed lifting  opposite UE and LE Performed hip extension RLE and LLE in quadruped - attempted to perform knee flexion/extension with leg lifted but difficult For pt to keep knee extended Transitional movement of tall kneeling to side sitting with return to tall kneeling without UE support - using trunk muscles only  Tall kneeling - squats 10 reps back onto heels 1/2 kneeling on each leg without UE support - reaching with one hand in various directions for improved trunk stabilization   TherAct.; lunges at counter -with bil. UE support on counter 5 reps each leg with demonstrational cues  Jumping activity - attempted to jump over theraband on floor - pt unable to clear band; unable to jump backward Modified activity to jumping straight up with cues to jump high in attempt for both feet to clear floor                 Balance Exercises - 12/16/18 1249      Balance Exercises: Standing   Standing Eyes Opened  Narrow base of support (BOS);Wide (BOA);Head turns;Foam/compliant surface    Standing Eyes Closed  Narrow base of support (BOS);Wide (BOA);Head turns;Foam/compliant surface    Step Over Hurdles / Cones  stepping over ornage hurdle 10 reps each leg while standing on blue Airex    Other Standing Exercises  marching on Airex with EC with min to CGA          PT Education - 12/16/18 1310    Education Details  quadruped exercises to be given to pt for HEP    Person(s) Educated  Patient    Methods  Explanation;Demonstration    Comprehension  Verbalized understanding          PT Long Term Goals - 11/18/18 1022      PT LONG TERM GOAL #1   Title  Pt will increase DGI score by at least 5 points to demo improved gait.    Time  4    Period  Weeks    Status  On-going    Target Date  12/18/18      PT LONG TERM GOAL #2   Title  Increase distance in 6" walk test by at least 100' for increased endurance and gait speed.    Time  4    Period  Weeks    Status  On-going    Target Date   12/18/18      PT LONG TERM GOAL #3   Title  Pt will negotiate steps with 1 rail only using a step over step sequence; sister will report pt negotiating steps at home in this manner (states he currently hugs the rail with both hands due to fear of falling).     Baseline  sister states he continues to be fearful and hold onto rail with both hands at home but is improving with step negotiation at [employer] - 11-17-18    Time  4    Period  Weeks    Status  Partially Met    Target Date  12/18/18      PT LONG TERM GOAL #4   Title  Increase gait velocity from 3.35 ft/sec to >/= 3.8 ft/sec for increased gait efficiency.     Baseline  8.34 secs = 3.93 ft/sec on 11-17-18    Time  4    Period  Weeks    Status  Achieved      PT LONG TERM GOAL #5   Title  Improve TUG score from 13.58 secs to </= 11 secs for improved functional mobility.      Baseline  16.54, 13.58 secs;  9.00 secs on 11-17-18    Time  4    Period  Weeks    Status  Achieved      PT LONG TERM GOAL #6   Title  Amb. 300' outside on paved uneven surface and on grassy terrain with supervision without LOB.    Baseline  met 11-17-18    Status  Achieved      PT LONG TERM GOAL #7   Title  Perform HEP with caregiver assistance prn due to cognitive deficits.    Baseline  met 11-17-18    Status  Achieved      PT LONG TERM GOAL #8   Title  Pt will jog 200' on flat even surface with supervision.    Time  4    Period  Weeks    Status  New    Target Date  12/18/18      PT LONG TERM GOAL  #9   TITLE  Pt will demonstrate improved gait pattern with reduced gait deviaitons by amb. with decreased steppage gait RLE with increased arm swing with no cues needed for initial heel contact RLE.    Time  4    Period  Weeks    Status  New    Target Date  12/18/18              Plan - 12/16/18 1251    Clinical Impression Statement  Pt is progressing with trunk control and transitional movements using trunk with trunk and pelvis dissociation; pt  demonstrates improved core stabilization as pt is able to maintain balance in quadruped position with lifting opposite UE and LE; pt continues to have difficulty with jumping forward and backward but demonstrates improvement with repetition                                                                                        Rehab Potential  Good    Clinical Impairments Affecting Rehab Potential  language barrier and cognitive deficits - pt's sister [name] interpreted at eval    PT Frequency  2x / week    PT Duration  4 weeks    PT Treatment/Interventions  ADLs/Self Care Home Management;Aquatic Therapy;Gait training;Stair training;Therapeutic activities;Therapeutic exercise;Balance training;Neuromuscular re-education;Patient/family education    PT Next Visit Plan  Check LTG's - renew -  Trunk/core stabilization exercises - in tall kneeling, 1/2 kneeling etc. Continue coordination &  high level balance activities - jumping on mini-trampoline, jogging, etc. Plan to D/C in 2 weeks    PT Home Exercise Plan   [id number]; added side, forward and back kicks and braiding from [facility] on 10-27-18    Consulted and Agree with Plan of Care  Patient;Family member/caregiver    Family Member Consulted  sister [name]       Patient will benefit from skilled therapeutic intervention in order to improve the following deficits and impairments:  Abnormal gait, Decreased activity tolerance, Decreased balance, Decreased cognition, Decreased coordination, Decreased strength  Visit Diagnosis: Muscle weakness (generalized)  Other lack of coordination  Other abnormalities of gait and mobility     Problem List Patient Active Problem List   Diagnosis Date Noted  . Microalbuminuria 12/15/2018  . Hypothyroidism 09/21/2018  . Seizure disorder ([place]) 06/16/2018  . Low testosterone 06/16/2018  . Traumatic brain injury with loss of consciousness ([place]) 10/29/2017  . Anemia 10/29/2017  . Positive depression screening  10/29/2017  . Attention and concentration deficit   . Type 2 diabetes mellitus with peripheral neuropathy (HCC)   . S/P craniotomy 03/19/2017  . Essential hypertension   . Dysphagia, oropharyngeal 02/01/2017  . Diabetes type 2, uncontrolled ([place]) 06/13/2015    Ainara Eldridge, [place], PT 12/16/2018, 1:10 PM  [facility] [address] [place], [place], [address] Phone: [phone]   Fax:  [phone]  Name: [name] MRN: [id number] Date of Birth: February 15, 1966

## 2018-12-20 ENCOUNTER — Ambulatory Visit: Payer: Self-pay | Admitting: Occupational Therapy

## 2018-12-20 ENCOUNTER — Ambulatory Visit: Payer: Self-pay | Admitting: Physical Therapy

## 2018-12-20 DIAGNOSIS — R278 Other lack of coordination: Secondary | ICD-10-CM

## 2018-12-20 DIAGNOSIS — M6281 Muscle weakness (generalized): Secondary | ICD-10-CM

## 2018-12-20 DIAGNOSIS — R2681 Unsteadiness on feet: Secondary | ICD-10-CM

## 2018-12-20 DIAGNOSIS — R2689 Other abnormalities of gait and mobility: Secondary | ICD-10-CM

## 2018-12-20 DIAGNOSIS — R41840 Attention and concentration deficit: Secondary | ICD-10-CM

## 2018-12-20 NOTE — Therapy (Signed)
[facility] [address] [place], [place], [address] Phone: [phone]   Fax:  [phone]  Occupational Therapy Treatment  Patient Details  Name: [name] MRN: [id number] Date of Birth: [birth year] Referring Provider (OT): Dr. Faith Rogue   Encounter Date: 12/20/2018  OT End of Session - 12/20/18 1032    Visit Number  9    Number of Visits  25    Date for OT Re-Evaluation  02/13/19    Authorization Type  [facility] 08/01/18-01/03/19     OT Start Time  1020    OT Stop Time  1100    OT Time Calculation (min)  40 min    Activity Tolerance  Patient tolerated treatment well    Behavior During Therapy  [facility] for tasks assessed/performed;Flat affect       Past Medical History:  Diagnosis Date  . Diabetes mellitus without complication (HCC)   . Seizures (HCC)   . TBI (traumatic brain injury) (HCC) 03/2017    Past Surgical History:  Procedure Laterality Date  . CRANIOTOMY Right 01/13/2017   Procedure: 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, Placement of bone flap in abdomen;  Surgeon: Coletta Memos, MD;  Location: [facility] OR;  Service: Neurosurgery;  Laterality: Right;  . CRANIOTOMY Right 03/19/2017   Procedure: Cranioplasty with placement of bone flap from abdomen;  Surgeon: Tia Alert, MD;  Location[facility] OR;  Service: Neurosurgery;  Laterality: Right;    There were no vitals filed for this visit.  Subjective Assessment - 12/20/18 1033    Subjective   Sister reports that pt is performing puzzles at home, made sandwiches with supervision-min v.c.    Pertinent History  DM, HTN, seizure disorder, hypothyroidism    Patient Stated Goals  "to be well"    Currently in Pain?  No/denies        Functional reaching to place small pegs in vertical pegboard (mid-level reaching) with LUE with min difficulty with coordination and min-mod cueing for copying mod complex design.      In  supine, reviewed cane ex for shoulder flex, chest press and abduction with min cueing for impulsivity.  In standing, scapular adduction with shoulder extension with mod cues for incr LUE use and to slow down.  Folding towels, wash cloths, pillowcases.  Pt needed min cueing/demo initially, then able to complete without cueing.  Recommended pt fold washcloths/towels at home.    Placing Perfection pieces in board with L hand with min-mod cueing for impulsivity, to look at piece before attempting to place in.       OT Short Term Goals - 11/15/18 1147      OT SHORT TERM GOAL #1   Title  Pt will be independent with initial HEP.--check STGs 12/27/18    Time  6    Period  Weeks    Status  New      OT SHORT TERM GOAL #2   Title  Pt will improve L grip strength by at least 5lbs to assit with IADLs.      Baseline  20lbs    Time  6    Period  Weeks    Status  New      OT SHORT TERM GOAL #3   Title  Pt will be improve LUE control/strength to able to performing targeted overhead reaching with LUE x without drops.    Time  6    Period  Weeks    Status  New      OT SHORT TERM GOAL #4   Title  Pt/family will verbalize understanding of cognitive compensation strategies for ADLs/IADLs.    Time  6    Period  Weeks    Status  New      OT SHORT TERM GOAL #5   Title  Pt will attend to functional cognitive tasks for at least 10 min with only min cueing for problem solving.    Time  6    Period  Weeks    Status  New        OT Long Term Goals - 11/15/18 1154      OT LONG TERM GOAL #1   Title  Pt will be independent with updated HEP.--check LTGs 02/13/19    Time  12    Period  Weeks    Status  New      OT LONG TERM GOAL #2   Title  Pt will improve L grip strength by at least 10lbs to assit with IADLs.      Time  12    Period  Weeks    Status  New      OT LONG TERM GOAL #3   Title  Pt will be able to retrieve/replace 3lb object on overhead shelf with LUE x3 with good  safety/control.    Time  12    Period  Weeks    Status  New      OT LONG TERM GOAL #4   Title  Pt will perform BADLs and simple selected home maintenance tasks with distant supervision using schedule/strategies prn.    Time  12    Period  Weeks    Status  New      OT LONG TERM GOAL #5   Title  Pt will perform simple snack prep, use microwave, cold meal prep and make appropriate choices mod I.     Time  12    Period  Weeks    Status  New            Plan - 12/20/18 1033    Clinical Impression Statement  Pt is proressing towards goals.  Pt needs less cueing overall for functional tasks and is increasing IADL performance at home.    Occupational Profile and client history currently impacting functional performance  Pt was independent and working prior to injury; however, now pt needs set-up/supervision and cueing for ADLs/IADLs.  Pt enjoyed wrestling and soccer prior to injury, but is now unable to engage in these activities.    Occupational performance deficits (Please refer to evaluation for details):  ADL's;IADL's;Leisure;Work;Social Participation    Rehab Potential  Fair    Current Impairments/barriers affecting progress:  length of time since injury, cognitive deficits, Spanish speaking    OT Frequency  2x / week    OT Duration  12 weeks    OT Treatment/Interventions  Self-care/ADL training;Electrical Stimulation;Therapeutic exercise;Visual/perceptual remediation/compensation;Aquatic Therapy;Moist Heat;Paraffin;Neuromuscular education;Patient/family education;Splinting;Fluidtherapy;Energy conservation;Building services engineerunctional Mobility Training;Therapeutic activities;Cryotherapy;Ultrasound;DME and/or AE instruction;Contrast Bath;Manual Therapy;Passive range of motion;Cognitive remediation/compensation;Balance training    Plan  continue to address cogntion, problem solving    Consulted and Agree with Plan of Care  Family member/caregiver;Patient;Other (Comment)    Family Member Consulted  sister,  interpreter       Patient will benefit from skilled therapeutic intervention in order to improve the following deficits and impairments:  Decreased cognition, Decreased knowledge of use of DME, Impaired vision/preception, Decreased coordination, Abnormal gait, Decreased mobility, Decreased strength, Decreased range of  motion, Decreased activity tolerance, Decreased balance, Decreased knowledge of precautions, Decreased safety awareness, Difficulty walking, Impaired perceived functional ability, Impaired UE functional use  Visit Diagnosis: Attention and concentration deficit  Muscle weakness (generalized)  Other lack of coordination  Other abnormalities of gait and mobility  Unsteadiness on feet    Problem List Patient Active Problem List   Diagnosis Date Noted  . Microalbuminuria 12/15/2018  . Hypothyroidism 09/21/2018  . Seizure disorder (HCC) 06/16/2018  . Low testosterone 06/16/2018  . Traumatic brain injury with loss of consciousness (HCC) 10/29/2017  . Anemia 10/29/2017  . Positive depression screening 10/29/2017  . Attention and concentration deficit   . Type 2 diabetes mellitus with peripheral neuropathy (HCC)   . S/P craniotomy 03/19/2017  . Essential hypertension   . Dysphagia, oropharyngeal 02/01/2017  . Diabetes type 2, uncontrolled (HCC) 06/13/2015    [facility] 12/20/2018, 3:42 PM  [facility] [address] [place], [place], [address] Phone: [phone]   Fax:  [phone]  Name: [name] MRN: [id number] Date of Birth: 01/18/1966   Willa Frater, OTR/L [facility] [address] [place], [place]  [phone] phone [phone] 12/20/18 3:42 PM

## 2018-12-21 ENCOUNTER — Encounter: Payer: Self-pay | Admitting: Physical Therapy

## 2018-12-21 NOTE — Therapy (Signed)
[facility] [address] [place], [place], [address] Phone: [phone]   Fax:  [phone]  Physical Therapy Treatment  Patient Details  Name: [name] MRN: [id number] Date of Birth: December 18, 1965 Referring Provider (PT): Dr. Alger Simons   Encounter Date: 12/20/2018  PT End of Session - 12/21/18 1522    Visit Number  17    Number of Visits  21    Date for PT Re-Evaluation  01/03/19    Authorization Type  [facility] [employer] 08-01-18 - 01-03-19; renewal completed for 8 additional visits 11-17-17 - 12-18-18    Authorization Time Period  08-01-18 - 01-03-19    PT Start Time  0931    PT Stop Time  1016    PT Time Calculation (min)  45 min    Equipment Utilized During Treatment  Gait belt       Past Medical History:  Diagnosis Date  . Diabetes mellitus without complication ([place])   . Seizures ([place])   . TBI (traumatic brain injury) ([place]) 03/2017    Past Surgical History:  Procedure Laterality Date  . CRANIOTOMY Right 01/13/2017   Procedure: 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, Placement of bone flap in abdomen;  Surgeon: Ashok Pall, MD;  Location: [facility];  Service: Neurosurgery;  Laterality: Right;  . CRANIOTOMY Right 03/19/2017   Procedure: Cranioplasty with placement of bone flap from abdomen;  Surgeon: Eustace Moore, MD;  Location: [facility];  Service: Neurosurgery;  Laterality: Right;    There were no vitals filed for this visit.  Subjective Assessment - 12/21/18 1518    Subjective  Pt reports no changes or problems since previous visit     Patient is accompained by:  Family member;Interpreter    Pertinent History  TBI secondary to SDH due to fall on 01-10-17; Rt craniectomy for hematoma on 01-13-17; Cranioplasty with placement of bone flap from abdomen on 03-19-17; pt received HH (few visits per sister's report) and no additional therapies due to lack of insurance at that time;  DM  type 2 with peripheral neuropathy;  seizures    Patient Stated Goals  Improve walking and balance    Currently in Pain?  No/denies                       [facility] Adult PT Treatment/Exercise - 12/21/18 0001      Transfers   Transfers  Sit to Stand    Sit to Stand  5: Supervision    Comments  feet on blue Airex; no UE support used      Ambulation/Gait   Ambulation/Gait  Yes    Ambulation/Gait Assistance  5: Supervision    Ambulation Distance (Feet)  250 Feet    Assistive device  None    Gait Pattern  Step-through pattern;Decreased arm swing - right    Stairs  Yes    Stairs Assistance  5: Supervision    Stairs Assistance Details (indicate cue type and reason)  cues for foot placement   step over step with rail; step by step no rail    Stair Management Technique  One rail Right;No rails;Alternating pattern;Step to pattern;Forwards    Number of Stairs  4    Height of Stairs  6        NeuroRe-ed:  Pt performed trunk/core stabilization exercises on red mat on floor; tall kneeling posiiton - amb. Forwards and backwards 3 reps In tall kneeling;  Lt 1/2  kneeling position with RUE support on mat prn - pt performed horizontal and vertical head turns 5 reps each in this position  1/2 kneeling position - alternate UE flexion (slowly) 5 reps each then reaching up with each hand diagonally with cues to turn head to look up at hand for increased Vestibular input in maintaining balance in 1/2 kneeling position for Rt pelvic and RLE stabilization; Pt performed trunk rotation in tall kneeling - hands clasped together with UE's In 90 degrees of shoulder flexion - moving arms/hands to right and left sides with cues to track hands and maintain balance for trunk stabilization  Pt perfomed crawling forwards/backwards on hands/knees for 2 reps; then progressed to attempting "crab walk" (hands on mat, leaning forward with knees extended)           PT Long Term Goals - 11/18/18 1022       PT LONG TERM GOAL #1   Title  Pt will increase DGI score by at least 5 points to demo improved gait.    Time  4    Period  Weeks    Status  On-going    Target Date  12/18/18      PT LONG TERM GOAL #2   Title  Increase distance in 6" walk test by at least 100' for increased endurance and gait speed.    Time  4    Period  Weeks    Status  On-going    Target Date  12/18/18      PT LONG TERM GOAL #3   Title  Pt will negotiate steps with 1 rail only using a step over step sequence; sister will report pt negotiating steps at home in this manner (states he currently hugs the rail with both hands due to fear of falling).     Baseline  sister states he continues to be fearful and hold onto rail with both hands at home but is improving with step negotiation at [facility] - 11-17-18    Time  4    Period  Weeks    Status  Partially Met    Target Date  12/18/18      PT LONG TERM GOAL #4   Title  Increase gait velocity from 3.35 ft/sec to >/= 3.8 ft/sec for increased gait efficiency.     Baseline  8.34 secs = 3.93 ft/sec on 11-17-18    Time  4    Period  Weeks    Status  Achieved      PT LONG TERM GOAL #5   Title  Improve TUG score from 13.58 secs to </= 11 secs for improved functional mobility.      Baseline  16.54, 13.58 secs;  9.00 secs on 11-17-18    Time  4    Period  Weeks    Status  Achieved      PT LONG TERM GOAL #6   Title  Amb. 300' outside on paved uneven surface and on grassy terrain with supervision without LOB.    Baseline  met 11-17-18    Status  Achieved      PT LONG TERM GOAL #7   Title  Perform HEP with caregiver assistance prn due to cognitive deficits.    Baseline  met 11-17-18    Status  Achieved      PT LONG TERM GOAL #8   Title  Pt will jog 200' on flat even surface with supervision.    Time  4    Period  Weeks    Status  New    Target Date  12/18/18      PT LONG TERM GOAL  #9   TITLE  Pt will demonstrate improved gait pattern with reduced gait deviaitons by  amb. with decreased steppage gait RLE with increased arm swing with no cues needed for initial heel contact RLE.    Time  4    Period  Weeks    Status  New    Target Date  12/18/18            Plan - 12/21/18 1525    Clinical Impression Statement  Pt continues to progress well towards goals with pt demonstrating improvement in trunk control and improved trunk stabilization by no LOB in 1/2 kneeling positions with pt able to reach in various directions with no LOB.  Pt continues to have diffculty with plyometric activities in achieving Rt foot clearance with jumping activities.                                                            Rehab Potential  Good    Clinical Impairments Affecting Rehab Potential  language barrier and cognitive deficits - pt's sister [name] interpreted at eval    PT Frequency  2x / week    PT Duration  4 weeks    PT Treatment/Interventions  ADLs/Self Care Home Management;Aquatic Therapy;Gait training;Stair training;Therapeutic activities;Therapeutic exercise;Balance training;Neuromuscular re-education;Patient/family education    PT Next Visit Plan  Trunk/core stabilization exercises - in tall kneeling, 1/2 kneeling etc. Continue coordination &  high level balance activities - jumping on mini-trampoline, jogging, etc. Plan to D/C in 2 weeks    PT Home Exercise Plan   [id number]; added side, forward and back kicks and braiding from [facility] on 10-27-18    Consulted and Agree with Plan of Care  Patient;Family member/caregiver    Family Member Consulted  sister [name]       Patient will benefit from skilled therapeutic intervention in order to improve the following deficits and impairments:  Abnormal gait, Decreased activity tolerance, Decreased balance, Decreased cognition, Decreased coordination, Decreased strength  Visit Diagnosis: Other abnormalities of gait and mobility  Muscle weakness (generalized)  Other lack of coordination     Problem List Patient  Active Problem List   Diagnosis Date Noted  . Microalbuminuria 12/15/2018  . Hypothyroidism 09/21/2018  . Seizure disorder ([place]) 06/16/2018  . Low testosterone 06/16/2018  . Traumatic brain injury with loss of consciousness ([place]) 10/29/2017  . Anemia 10/29/2017  . Positive depression screening 10/29/2017  . Attention and concentration deficit   . Type 2 diabetes mellitus with peripheral neuropathy ([facility])   . S/P craniotomy 03/19/2017  . Essential hypertension   . Dysphagia, oropharyngeal 02/01/2017  . Diabetes type 2, uncontrolled ([place]) 06/13/2015    Mikle Sternberg, [place], PT 12/21/2018, 3:39 PM  [facility] [address] [place], [place], [address] Phone: [phone]   Fax:  [phone]  Name: Selig Wampole MRN: [id number] Date of Birth: Jun 05, 1966

## 2018-12-22 ENCOUNTER — Ambulatory Visit: Payer: Self-pay | Admitting: Occupational Therapy

## 2018-12-22 ENCOUNTER — Ambulatory Visit: Payer: Self-pay | Admitting: Physical Therapy

## 2018-12-22 ENCOUNTER — Encounter: Payer: Self-pay | Admitting: Occupational Therapy

## 2018-12-22 DIAGNOSIS — R278 Other lack of coordination: Secondary | ICD-10-CM

## 2018-12-22 DIAGNOSIS — R2681 Unsteadiness on feet: Secondary | ICD-10-CM

## 2018-12-22 DIAGNOSIS — R41844 Frontal lobe and executive function deficit: Secondary | ICD-10-CM

## 2018-12-22 DIAGNOSIS — R41840 Attention and concentration deficit: Secondary | ICD-10-CM

## 2018-12-22 DIAGNOSIS — R2689 Other abnormalities of gait and mobility: Secondary | ICD-10-CM

## 2018-12-22 DIAGNOSIS — M6281 Muscle weakness (generalized): Secondary | ICD-10-CM

## 2018-12-22 NOTE — Therapy (Signed)
[facility] [address] [place], , [address] Phone: [phone]   Fax:  [phone]  Occupational Therapy Treatment  Patient Details  Name: [name] MRN: [id number] Date of Birth: 04/09/1966 Referring Provider (OT): Dr. [name]   Encounter Date: 12/22/2018  OT End of Session - 12/22/18 0936    Visit Number  10    Number of Visits  25    Date for OT Re-Evaluation  02/13/19    Authorization Type  [facility] [employer] 08/01/18-01/03/19     OT Start Time  0937    OT Stop Time  1015    OT Time Calculation (min)  38 min    Activity Tolerance  Patient tolerated treatment well    Behavior During Therapy  WFL for tasks assessed/performed;Flat affect       Past Medical History:  Diagnosis Date  . Diabetes mellitus without complication ([facility])   . Seizures ([facility])   . TBI (traumatic brain injury) ([facility]) 03/2017    Past Surgical History:  Procedure Laterality Date  . CRANIOTOMY Right 01/13/2017   Procedure: 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, Placement of bone flap in abdomen;  Surgeon: [name], MD;  Location[facility];  Service: Neurosurgery;  Laterality: Right;  . CRANIOTOMY Right 03/19/2017   Procedure: Cranioplasty with placement of bone flap from abdomen;  Surgeon: Jones, [name], MD;  Location: [facility];  Service: Neurosurgery;  Laterality: Right;    There were no vitals filed for this visit.  Subjective Assessment - 12/22/18 0935    Pertinent History  DM, HTN, seizure disorder, hypothyroidism    Patient Stated Goals  "to be well"    Currently in Pain?  No/denies        Placing grooved pegs in pegboard for incr coordination/in-hand manipulation and visual-perceptual skills with min difficulty/cueing with LUE.  Folding t-shirts and shorts with good accuracy and bilateral hand use.  Matching clock times/faces on table with min-mod cueing for basic (hour/half-hour)  and mod cueing for more complex for time and to look to L and use organized scanning pattern.  All times, but only 10 faces at a time.  I<[id number] [id number]amily will verbalize understanding of cognitive compensation strategies for ADLs/IADLs.    Time  6  Period  Weeks    Status  New      OT SHORT TERM GOAL #5   Title  Pt will attend to functional cognitive tasks for at least 10 min with only min cueing for problem solving.    Time  6    Period  Weeks    Status  New        OT Long Term Goals - 11/15/18 1154      OT LONG TERM GOAL #1   Title  Pt will be independent with updated HEP.--check LTGs 02/13/19    Time  12    Period  Weeks    Status  New      OT LONG TERM GOAL #2   Title  Pt will improve L grip strength by at least 10lbs to assit with IADLs.      Time  12    Period  Weeks    Status  New      OT LONG TERM GOAL #3   Title  Pt will be able to retrieve/replace 3lb object on overhead shelf with LUE x3 with good safety/control.    Time  12    Period  Weeks    Status  New      OT LONG TERM GOAL #4   Title  Pt will perform BADLs and simple selected home maintenance  tasks with distant supervision using schedule/strategies prn.    Time  12    Period  Weeks    Status  New      OT LONG TERM GOAL #5   Title  Pt will perform simple snack prep, use microwave, cold meal prep and make appropriate choices mod I.     Time  12    Period  Weeks    Status  New            Plan - 12/22/18 1583    Clinical Impression Statement  Pt is progressing slowly towards goals.  Pt continues to be impulsive at times but is improving with repetition/initial cueing.      Occupational Profile and client history currently impacting functional performance  Pt was independent and working prior to injury; however, now pt needs set-up/supervision and cueing for ADLs/IADLs.  Pt enjoyed wrestling and soccer prior to injury, but is now unable to engage in these activities.    Occupational performance deficits (Please refer to evaluation for details):  ADL's;IADL's;Leisure;Work;Social Participation    Rehab Potential  Fair    Current Impairments/barriers affecting progress:  length of time since injury, cognitive deficits, Spanish speaking    OT Frequency  2x / week    OT Duration  12 weeks    OT Treatment/Interventions  Self-care/ADL training;Electrical Stimulation;Therapeutic exercise;Visual/perceptual remediation/compensation;Aquatic Therapy;Moist Heat;Paraffin;Neuromuscular education;Patient/family education;Splinting;Fluidtherapy;Energy conservation;Building services engineer;Therapeutic activities;Cryotherapy;Ultrasound;DME and/or AE instruction;Contrast Bath;Manual Therapy;Passive range of motion;Cognitive remediation/compensation;Balance training    Plan  Check STGs next week; continue to address cogntion, problem solving, functional IADL tasks    Consulted and Agree with Plan of Care  Family member/caregiver;Patient;Other (Comment)    Family Member Consulted  sister, interpreter       Patient will benefit from skilled therapeutic intervention in order to improve the  following deficits and impairments:  Decreased cognition, Decreased knowledge of use of DME, Impaired vision/preception, Decreased coordination, Abnormal gait, Decreased mobility, Decreased strength, Decreased range of motion, Decreased activity tolerance, Decreased balance, Decreased knowledge of precautions, Decreased safety awareness, Difficulty walking, Impaired perceived functional ability, Impaired UE functional use  Visit Diagnosis:  Attention and concentration deficit  Frontal lobe and executive function deficit  Other lack of coordination  Muscle weakness (generalized)  Other abnormalities of gait and mobility  Unsteadiness on feet    Problem List Patient Active Problem List   Diagnosis Date Noted  . Microalbuminuria 12/15/2018  . Hypothyroidism 09/21/2018  . Seizure disorder ([facility]) 06/16/2018  . Low testosterone 06/16/2018  . Traumatic brain injury with loss of consciousness ([facility]) 10/29/2017  . Anemia 10/29/2017  . Positive depression screening 10/29/2017  . Attention and concentration deficit   . Type 2 diabetes mellitus with peripheral neuropathy ([facility])   . S/P craniotomy 03/19/2017  . Essential hypertension   . Dysphagia, oropharyngeal 02/01/2017  . Diabetes type 2, uncontrolled ([facility]) 06/13/2015    [facility] 12/22/2018, 1:59 PM  [facility] [address] [place], [place], [address] Phone: [phone]   Fax:  [phone]  Name: [name] MRN: [id number] Date of Birth: 1966/04/09   Willa Frater, OTR/L [facility] [address] [place], [place]  [address] [phone] phone [phone] 12/22/18 1:59 PM

## 2018-12-23 ENCOUNTER — Encounter: Payer: Self-pay | Admitting: Physical Therapy

## 2018-12-23 NOTE — Therapy (Signed)
[facility] [address] [place], [place], [address] Phone: [phone]   Fax:  [phone]  Physical Therapy Treatment  Patient Details  Name: [name] MRN: [id number] Date of Birth: 05/16/1966 Referring Provider (PT): Dr. Alger Simons   Encounter Date: 12/22/2018  PT End of Session - 12/23/18 1712    Visit Number  18    Number of Visits  21    Authorization Type  [facility] [employer] 08-01-18 - 01-03-19; renewal completed for 8 additional visits 11-17-17 - 12-18-18    Authorization Time Period  08-01-18 - 01-03-19    PT Start Time  1017    PT Stop Time  1101    PT Time Calculation (min)  44 min    Equipment Utilized During Treatment  Gait belt       Past Medical History:  Diagnosis Date  . Diabetes mellitus without complication ([place])   . Seizures ([place])   . TBI (traumatic brain injury) ([place]) 03/2017    Past Surgical History:  Procedure Laterality Date  . CRANIOTOMY Right 01/13/2017   Procedure: 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, Placement of bone flap in abdomen;  Surgeon: Ashok Pall, MD;  Location: [facility];  Service: Neurosurgery;  Laterality: Right;  . CRANIOTOMY Right 03/19/2017   Procedure: Cranioplasty with placement of bone flap from abdomen;  Surgeon: Eustace Moore, MD;  Location: [facility];  Service: Neurosurgery;  Laterality: Right;    There were no vitals filed for this visit.  Subjective Assessment - 12/23/18 1645    Subjective  Pt reports no changes since previous visit     Patient is accompained by:  Family member    Pertinent History  TBI secondary to SDH due to fall on 01-10-17; Rt craniectomy for hematoma on 01-13-17; Cranioplasty with placement of bone flap from abdomen on 03-19-17; pt received HH (few visits per sister's report) and no additional therapies due to lack of insurance at that time;  DM type 2 with peripheral neuropathy;  seizures    Patient Stated  Goals  Improve walking and balance    Currently in Pain?  No/denies                       [facility] Adult PT Treatment/Exercise - 12/23/18 0001      Lumbar Exercises: Quadruped   Opposite Arm/Leg Raise  Right arm/Left leg;Left arm/Right leg;Other (comment)   3 reps each side     Knee/Hip Exercises: Standing   Heel Raises  Both;1 set;10 reps;Right;20 reps   RLE only 2 sets 10 reps; bil. LE's 10 reps          Balance Exercises - 12/23/18 1647      Balance Exercises: Standing   Standing Eyes Opened  Wide (BOA);Head turns;Foam/compliant surface;5 reps   on Bosu   Tandem Stance  Eyes open;Foam/compliant surface;5 reps   on blue balance beam inside // bars   SLS with Vectors  Foam/compliant surface;Upper extremity assist 2;5 reps   on Bosu (blue side) moving each leg up/back and out to side      TherAct: pt performed plyometric activities on mini trampoline - jumping, 1/2 jumping jacks and lunges 10 reps each; attempted RLE single limb hopping but pt has difficulty clearing surface due to plantarflexor weakness  NeuroRe-ed:  physioball activity - pt in prone with ball under lower legs; pt supporting his weight on bil. UE's on mat on floor; pt performed  Hip flexion/extension with rolling ball up toward hips with mod to min assist - 10 reps  Quadruped - RLE hip extension - knee flexion/extension 10 reps with assistance to maintain RLE extended position  1/2 kneeling on RLE and then on LLE - reaching with each hand in various locations for balance and trunk stabilization  Pt performed crawling forward and backward 2 reps on mat on floor Pt able to transfer floor to stand without UE support with SBA   PT Long Term Goals - 11/18/18 1022      PT LONG TERM GOAL #1   Title  Pt will increase DGI score by at least 5 points to demo improved gait.    Time  4    Period  Weeks    Status  On-going    Target Date  12/18/18      PT LONG TERM GOAL #2   Title  Increase  distance in 6" walk test by at least 100' for increased endurance and gait speed.    Time  4    Period  Weeks    Status  On-going    Target Date  12/18/18      PT LONG TERM GOAL #3   Title  Pt will negotiate steps with 1 rail only using a step over step sequence; sister will report pt negotiating steps at home in this manner (states he currently hugs the rail with both hands due to fear of falling).     Baseline  sister states he continues to be fearful and hold onto rail with both hands at home but is improving with step negotiation at [facility] - 11-17-18    Time  4    Period  Weeks    Status  Partially Met    Target Date  12/18/18      PT LONG TERM GOAL #4   Title  Increase gait velocity from 3.35 ft/sec to >/= 3.8 ft/sec for increased gait efficiency.     Baseline  8.34 secs = 3.93 ft/sec on 11-17-18    Time  4    Period  Weeks    Status  Achieved      PT LONG TERM GOAL #5   Title  Improve TUG score from 13.58 secs to </= 11 secs for improved functional mobility.      Baseline  16.54, 13.58 secs;  9.00 secs on 11-17-18    Time  4    Period  Weeks    Status  Achieved      PT LONG TERM GOAL #6   Title  Amb. 300' outside on paved uneven surface and on grassy terrain with supervision without LOB.    Baseline  met 11-17-18    Status  Achieved      PT LONG TERM GOAL #7   Title  Perform HEP with caregiver assistance prn due to cognitive deficits.    Baseline  met 11-17-18    Status  Achieved      PT LONG TERM GOAL #8   Title  Pt will jog 200' on flat even surface with supervision.    Time  4    Period  Weeks    Status  New    Target Date  12/18/18      PT LONG TERM GOAL  #9   TITLE  Pt will demonstrate improved gait pattern with reduced gait deviaitons by amb. with decreased steppage gait RLE with increased arm swing with no cues needed for initial  heel contact RLE.    Time  4    Period  Weeks    Status  New    Target Date  12/18/18            Plan - 12/23/18 1713     Clinical Impression Statement  Pt continues to improve with trunk control and with performing transitional movements, i.e. tall kneeling to Rt and Lt side sitting and back to tall kneeling; pt continues to have difficulty achieving Rt foot clearance in jumping and hopping activities     Rehab Potential  Good    Clinical Impairments Affecting Rehab Potential  language barrier and cognitive deficits - pt's sister [name] interpreted at eval    PT Frequency  2x / week    PT Duration  4 weeks    PT Treatment/Interventions  ADLs/Self Care Home Management;Aquatic Therapy;Gait training;Stair training;Therapeutic activities;Therapeutic exercise;Balance training;Neuromuscular re-education;Patient/family education    PT Next Visit Plan  give quadruped for HEP; cont balance and gait    PT Home Exercise Plan   [id number]; added side, forward and back kicks and braiding from [facility] on 10-27-18    Consulted and Agree with Plan of Care  Patient;Family member/caregiver    Family Member Consulted  sister [name]       Patient will benefit from skilled therapeutic intervention in order to improve the following deficits and impairments:  Abnormal gait, Decreased activity tolerance, Decreased balance, Decreased cognition, Decreased coordination, Decreased strength  Visit Diagnosis: Unsteadiness on feet  Other lack of coordination  Other abnormalities of gait and mobility     Problem List Patient Active Problem List   Diagnosis Date Noted  . Microalbuminuria 12/15/2018  . Hypothyroidism 09/21/2018  . Seizure disorder ([place]) 06/16/2018  . Low testosterone 06/16/2018  . Traumatic brain injury with loss of consciousness ([place]) 10/29/2017  . Anemia 10/29/2017  . Positive depression screening 10/29/2017  . Attention and concentration deficit   . Type 2 diabetes mellitus with peripheral neuropathy ([facility])   . S/P craniotomy 03/19/2017  . Essential hypertension   . Dysphagia, oropharyngeal 02/01/2017  . Diabetes  type 2, uncontrolled ([place]) 06/13/2015    Lizbet Cirrincione, [place], PT 12/23/2018, 5:19 PM  [facility] [address] [place], [place], [address] Phone: [phone]   Fax:  [phone]  Name: Dahl Higinbotham MRN: [id number] Date of Birth: [birth year]

## 2018-12-27 ENCOUNTER — Encounter: Payer: Self-pay | Admitting: Occupational Therapy

## 2018-12-27 ENCOUNTER — Telehealth: Payer: Self-pay | Admitting: Internal Medicine

## 2018-12-27 ENCOUNTER — Ambulatory Visit: Payer: Self-pay | Admitting: Physical Therapy

## 2018-12-27 ENCOUNTER — Ambulatory Visit: Payer: Self-pay | Admitting: Occupational Therapy

## 2018-12-27 DIAGNOSIS — R2681 Unsteadiness on feet: Secondary | ICD-10-CM

## 2018-12-27 DIAGNOSIS — R278 Other lack of coordination: Secondary | ICD-10-CM

## 2018-12-27 DIAGNOSIS — R2689 Other abnormalities of gait and mobility: Secondary | ICD-10-CM

## 2018-12-27 DIAGNOSIS — R41840 Attention and concentration deficit: Secondary | ICD-10-CM

## 2018-12-27 DIAGNOSIS — R41844 Frontal lobe and executive function deficit: Secondary | ICD-10-CM

## 2018-12-27 DIAGNOSIS — M6281 Muscle weakness (generalized): Secondary | ICD-10-CM

## 2018-12-27 NOTE — Telephone Encounter (Signed)
New Message   Pt's sister is calling states the pt lost his orange card and he has a dentist appt that he has been waiting a year for so they are wanting to know if they can get a replacement card today before his dentis appt. Please follow up

## 2018-12-27 NOTE — Telephone Encounter (Signed)
Spoke with Pt sister to give new card

## 2018-12-28 LAB — FECAL OCCULT BLOOD, IMMUNOCHEMICAL: Fecal Occult Bld: NEGATIVE

## 2018-12-28 NOTE — Therapy (Signed)
[facility] [address] [place], [place], [address] Phone: [phone]   Fax:  [phone]  Occupational Therapy Treatment  Patient Details  Name: [name] MRN: [id number] Date of Birth: 06-Apr-1966 Referring Provider (OT): Dr. Faith Rogue   Encounter Date: 12/27/2018  OT End of Session - 12/28/18 2131    Visit Number  11    Number of Visits  25    Date for OT Re-Evaluation  02/13/19    Authorization Type  [facility] 08/01/18-01/03/19     OT Start Time  0940    OT Stop Time  1018    OT Time Calculation (min)  38 min    Activity Tolerance  Patient tolerated treatment well    Behavior During Therapy  [facility] for tasks assessed/performed;Flat affect       Past Medical History:  Diagnosis Date  . Diabetes mellitus without complication (HCC)   . Seizures (HCC)   . TBI (traumatic brain injury) (HCC) 03/2017    Past Surgical History:  Procedure Laterality Date  . CRANIOTOMY Right 01/13/2017   Procedure: 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, Placement of bone flap in abdomen;  Surgeon: Coletta Memos, MD;  Location: [facility] OR;  Service: Neurosurgery;  Laterality: Right;  . CRANIOTOMY Right 03/19/2017   Procedure: Cranioplasty with placement of bone flap from abdomen;  Surgeon: Tia Alert, MD;  Location: [facility] OR;  Service: Neurosurgery;  Laterality: Right;    There were no vitals filed for this visit.  Subjective Assessment - 12/28/18 2132    Subjective   Sister reports that pt is doing better with schedule, washing dishes better, vacuuming with supervision/min cues to ensure clean    Patient is accompained by:  Family member;Interpreter    Pertinent History  DM, HTN, seizure disorder, hypothyroidism    Patient Stated Goals  "to be well"    Currently in Pain?  No/denies        Completing 12 piece puzzle with min-mod cueing for problem solving and visual  scanning.  Completing Purdue Pegboard with mod cueing for sequencing, attention/to continue task, using L hand with min difficulty for coordination.  Environmental scanning with 8/15 items found initially, then additional 3 found on 2nd pass.   Pt needed mod-max cueing for remaining.  Pt demo impulsivity and decr attention and spontaneous visual scanning.  Began checking STGs and discussing progress--see below.  Also discussed safety concerns/considerations due to decr attention and decr environmental/visual scanning.  Emphasized importance of using head turns and that pt should be cued at home and in community for this.  Pt/sister verbalized understanding.      OT Short Term Goals - 12/27/18 1001      OT SHORT TERM GOAL #1   Title  Pt will be independent with initial HEP.--check STGs 12/27/18    Time  6    Period  Weeks    Status  New      OT SHORT TERM GOAL #2   Title  Pt will improve L grip strength by at least 5lbs to assit with IADLs.      Baseline  20lbs    Time  6    Period  Weeks    Status  Achieved   12/27/18  46lbs     OT SHORT TERM GOAL #3   Title  Pt will be improve LUE control/strength to able to performing targeted overhead reaching with LUE x without drops.  Time  6    Period  Weeks    Status  New      OT SHORT TERM GOAL #4   Title  Pt/family will verbalize understanding of cognitive compensation strategies for ADLs/IADLs.    Time  6    Period  Weeks    Status  Achieved      OT SHORT TERM GOAL #5   Title  Pt will attend to functional cognitive tasks for at least 10 min with only min cueing for problem solving.    Time  6    Period  Weeks    Status  On-going   12/27/18:  2-60min, mod cueing (dependent on task)       OT Long Term Goals - 12/28/18 2136      OT LONG TERM GOAL #1   Title  Pt will be independent with updated HEP.--check LTGs 02/13/19    Time  12    Period  Weeks    Status  New      OT LONG TERM GOAL #2   Title  Pt will improve L  grip strength by at least 10lbs to assit with IADLs.      Time  12    Period  Weeks    Status  New      OT LONG TERM GOAL #3   Title  Pt will be able to retrieve/replace 3lb object on overhead shelf with LUE x3 with good safety/control.    Time  12    Period  Weeks    Status  New      OT LONG TERM GOAL #4   Title  Pt will perform BADLs and simple selected home maintenance tasks with distant supervision using schedule/strategies prn.    Time  12    Period  Weeks    Status  New      OT LONG TERM GOAL #5   Title  Pt will perform simple snack prep, use microwave, cold meal prep and make appropriate choices mod I.     Time  12    Period  Weeks    Status  New            Plan - 12/28/18 2123    Clinical Impression Statement  Pt is participating more in IADLs per family report, but pt still needs cueing for attention to task to complete and finish thouroughly.      Occupational Profile and client history currently impacting functional performance  Pt was independent and working prior to injury; however, now pt needs set-up/supervision and cueing for ADLs/IADLs.  Pt enjoyed wrestling and soccer prior to injury, but is now unable to engage in these activities.    Occupational performance deficits (Please refer to evaluation for details):  ADL's;IADL's;Leisure;Work;Social Participation    Rehab Potential  Fair    Current Impairments/barriers affecting progress:  length of time since injury, cognitive deficits, Spanish speaking    OT Frequency  2x / week    OT Duration  12 weeks    OT Treatment/Interventions  Self-care/ADL training;Electrical Stimulation;Therapeutic exercise;Visual/perceptual remediation/compensation;Aquatic Therapy;Moist Heat;Paraffin;Neuromuscular education;Patient/family education;Splinting;Fluidtherapy;Energy conservation;Building services engineer;Therapeutic activities;Cryotherapy;Ultrasound;DME and/or AE instruction;Contrast Bath;Manual Therapy;Passive range of  motion;Cognitive remediation/compensation;Balance training    Plan  continue to check STGs; continue to address cogntion, problem solving, functional IADL tasks    Consulted and Agree with Plan of Care  Family member/caregiver;Patient;Other (Comment)    Family Member Consulted  sister, interpreter       Patient will benefit from  skilled therapeutic intervention in order to improve the following deficits and impairments:  Decreased cognition, Decreased knowledge of use of DME, Impaired vision/preception, Decreased coordination, Abnormal gait, Decreased mobility, Decreased strength, Decreased range of motion, Decreased activity tolerance, Decreased balance, Decreased knowledge of precautions, Decreased safety awareness, Difficulty walking, Impaired perceived functional ability, Impaired UE functional use  Visit Diagnosis: Frontal lobe and executive function deficit  Attention and concentration deficit  Other lack of coordination  Other abnormalities of gait and mobility  Unsteadiness on feet  Muscle weakness (generalized)    Problem List Patient Active Problem List   Diagnosis Date Noted  . Microalbuminuria 12/15/2018  . Hypothyroidism 09/21/2018  . Seizure disorder (HCC) 06/16/2018  . Low testosterone 06/16/2018  . Traumatic brain injury with loss of consciousness (HCC) 10/29/2017  . Anemia 10/29/2017  . Positive depression screening 10/29/2017  . Attention and concentration deficit   . Type 2 diabetes mellitus with peripheral neuropathy (HCC)   . S/P craniotomy 03/19/2017  . Essential hypertension   . Dysphagia, oropharyngeal 02/01/2017  . Diabetes type 2, uncontrolled (HCC) 06/13/2015    [facility] 12/28/2018, 9:39 PM   [facility] [address] [place], [place], [address] Phone: [phone]   Fax:  [phone]  Name: Anna Livers MRN: [id number] Date of Birth: 02-Nov-1966   Willa Frater,  OTR/L [facility] [address] [place], [place]  [address] [phone] phone [phone] 12/28/18 9:39 PM

## 2018-12-29 ENCOUNTER — Ambulatory Visit: Payer: Self-pay | Admitting: Occupational Therapy

## 2018-12-29 ENCOUNTER — Encounter: Payer: Self-pay | Admitting: Occupational Therapy

## 2018-12-29 ENCOUNTER — Ambulatory Visit: Payer: Self-pay | Admitting: Physical Therapy

## 2018-12-29 DIAGNOSIS — R2689 Other abnormalities of gait and mobility: Secondary | ICD-10-CM

## 2018-12-29 DIAGNOSIS — R2681 Unsteadiness on feet: Secondary | ICD-10-CM

## 2018-12-29 DIAGNOSIS — R41840 Attention and concentration deficit: Secondary | ICD-10-CM

## 2018-12-29 DIAGNOSIS — M6281 Muscle weakness (generalized): Secondary | ICD-10-CM

## 2018-12-29 DIAGNOSIS — R41844 Frontal lobe and executive function deficit: Secondary | ICD-10-CM

## 2018-12-29 DIAGNOSIS — R278 Other lack of coordination: Secondary | ICD-10-CM

## 2018-12-29 NOTE — Therapy (Addendum)
[facility] [address] [place], [place], [address] Phone: [phone]   Fax:  [phone]  Occupational Therapy Treatment  Patient Details  Name: [name] MRN: [id number] Date of Birth: [birth year] Referring Provider (OT): Dr. Alger Simons   Encounter Date: 12/29/2018  OT End of Session - 12/29/18 0952    Visit Number  12    Number of Visits  25    Date for OT Re-Evaluation  02/13/19    Authorization Type  [facility] 08/01/18-01/03/19     OT Start Time  0935    OT Stop Time  1015    OT Time Calculation (min)  40 min    Activity Tolerance  Patient tolerated treatment well    Behavior During Therapy  [facility] for tasks assessed/performed;Flat affect       Past Medical History:  Diagnosis Date  . Diabetes mellitus without complication ([place])   . Seizures ([place])   . TBI (traumatic brain injury) ([place]) 03/2017    Past Surgical History:  Procedure Laterality Date  . CRANIOTOMY Right 01/13/2017   Procedure: 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, Placement of bone flap in abdomen;  Surgeon: Ashok Pall, MD;  Location: [facility];  Service: Neurosurgery;  Laterality: Right;  . CRANIOTOMY Right 03/19/2017   Procedure: Cranioplasty with placement of bone flap from abdomen;  Surgeon: Eustace Moore, MD;  Location: [place];  Service: Neurosurgery;  Laterality: Right;    There were no vitals filed for this visit.  Subjective Assessment - 12/29/18 0936    Subjective   Sister reports that pt folded some clothes this morning and made sandwiches    Patient is accompained by:  Family member;Interpreter   interpreter only present for part of session, sister interpreted remainder of session   Pertinent History  DM, HTN, seizure disorder, hypothyroidism    Patient Stated Goals  "to be well"    Currently in Pain?  No/denies         Functional reaching overhead to place small pegs in vertical  pegboard with LUE with min-mod cueing initially, then completed without additional cueing.  Pt with min difficulty with coordination, noted decr visual perceptual/hand-eye coordination as pt tried to remove peg to the right of actual peg (only one peg in board at this time).  Pt reports that he can't see well, but denies blurriness or double vision per interpreter  Copying clock with min cueing for drawing circle and decr accuracy with countor and spacing of numbers.  Discussed/recommended that pt have visual evaluation with ophthalmologist when possible (last exam approx 1 year ago and pt was limited in participation).  Stringing pegs for bilateral hand coordination and following pattern with min cueing initially.  Then able to complete task mod I.   Recommended pt incr participation in IADLs including stirring, washing vegetables, tearing lettuce, etc.  Pt/family verbalized understanding.        OT Short Term Goals - 12/29/18 0954      OT SHORT TERM GOAL #1   Title  Pt will be independent with initial HEP.--check STGs 12/27/18    Time  6    Period  Weeks    Status  Partially Met   12/29/18:  pt/family, pt needs supervision/cueing from family (but sister able to provide cueing/supervision)     OT SHORT TERM GOAL #2   Title  Pt will improve L grip strength by at least 5lbs to assit with IADLs.  Baseline  20lbs    Time  6    Period  Weeks    Status  Achieved   12/27/18  46lbs     OT SHORT TERM GOAL #3   Title  Pt will be improve LUE control/strength to able to performing targeted overhead reaching with LUE x 69mn without drops.    Time  6    Period  Weeks    Status  Achieved   12/29/18     OT SHORT TERM GOAL #4   Title  Pt/family will verbalize understanding of cognitive compensation strategies for ADLs/IADLs.    Time  6    Period  Weeks    Status  Achieved   12/29/18 with pt/family     OT SHORT TERM GOAL #5   Title  Pt will attend to functional cognitive tasks for at  least 10 min with only min cueing for problem solving.    Time  6    Period  Weeks    Status  On-going   12/27/18:  2-51m, mod cueing (dependent on task)       OT Long Term Goals - 12/28/18 2136      OT LONG TERM GOAL #1   Title  Pt will be independent with updated HEP.--check LTGs 02/13/19    Time  12    Period  Weeks    Status  New      OT LONG TERM GOAL #2   Title  Pt will improve L grip strength by at least 10lbs to assit with IADLs.      Time  12    Period  Weeks    Status  New      OT LONG TERM GOAL #3   Title  Pt will be able to retrieve/replace 3lb object on overhead shelf with LUE x3 with good safety/control.    Time  12    Period  Weeks    Status  New      OT LONG TERM GOAL #4   Title  Pt will perform BADLs and simple selected home maintenance tasks with distant supervision using schedule/strategies prn.    Time  12    Period  Weeks    Status  New      OT LONG TERM GOAL #5   Title  Pt will perform simple snack prep, use microwave, cold meal prep and make appropriate choices mod I.     Time  12    Period  Weeks    Status  New              Patient will benefit from skilled therapeutic intervention in order to improve the following deficits and impairments:     Visit Diagnosis: Attention and concentration deficit  Frontal lobe and executive function deficit  Other lack of coordination  Other abnormalities of gait and mobility  Unsteadiness on feet  Muscle weakness (generalized)    Problem List Patient Active Problem List   Diagnosis Date Noted  . Microalbuminuria 12/15/2018  . Hypothyroidism 09/21/2018  . [facility] ([facility] [place]/15/2019  . Low testosterone 06/16/2018  . Tra[facility] [place] with loss of consciousness ([facility]12/28/2018  . Anemia 10/29/2017  . Positive depression screening 10/29/2017  . Attention and concentration deficit   . Type 2 diabetes mellitus with peripheral neuropathy (HCC)   . S/P craniotomy 03/19/2017   . Essential hypertension   . Dysphagia, oropharyngeal 02/01/2017  . Diabetes type 2, uncontrolled ([facility]08/09/2015  [facility] 12/29/2018, 1:37 PM  [facility] [address] [place], [place], [address] Phone: [phone]   Fax:  [phone]  Name: Ramond Darnell MRN: [id number] Date of Birth: [birth year]   Vianne Bulls, OTR/L [facility] [address] [place], [place]  [address] [phone] phone [phone] 12/29/18 1:37 PM

## 2018-12-30 NOTE — Therapy (Signed)
[facility] [address] [place], [place], [address] Phone: [phone]   Fax:  [phone]  Physical Therapy Treatment  Patient Details  Name: [name] MRN: [id number] Date of Birth: 08/31/1966 Referring Provider (PT): Dr. Alger Simons   Encounter Date: 12/29/2018  PT End of Session - 12/30/18 1250    Visit Number  19    Number of Visits  21    Date for PT Re-Evaluation  01/03/19    Authorization Type  [facility] 08-01-18 - 01-03-19; renewal completed for 8 additional visits 11-17-17 - 12-18-18    Authorization Time Period  08-01-18 - 01-03-19    PT Start Time  1016    PT Stop Time  1100    PT Time Calculation (min)  44 min    Equipment Utilized During Treatment  Gait belt    Activity Tolerance  Patient tolerated treatment well    Behavior During Therapy  [facility] for tasks assessed/performed       Past Medical History:  Diagnosis Date  . Diabetes mellitus without complication ([place])   . Seizures ([place])   . TBI (traumatic brain injury) ([place]) 03/2017    Past Surgical History:  Procedure Laterality Date  . CRANIOTOMY Right 01/13/2017   Procedure: 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, Placement of bone flap in abdomen;  Surgeon: Ashok Pall, MD;  Location: [facility];  Service: Neurosurgery;  Laterality: Right;  . CRANIOTOMY Right 03/19/2017   Procedure: Cranioplasty with placement of bone flap from abdomen;  Surgeon: Eustace Moore, MD;  Location: [facility];  Service: Neurosurgery;  Laterality: Right;    There were no vitals filed for this visit.  Subjective Assessment - 12/30/18 1246    Subjective  Sister reports pt has difficulty with walking up moderately steep hill at their home - says he is very fearful of falling when he walks up     Patient is accompained by:  Family member    Pertinent History  TBI secondary to SDH due to fall on 01-10-17; Rt craniectomy for hematoma on  01-13-17; Cranioplasty with placement of bone flap from abdomen on 03-19-17; pt received HH (few visits per sister's report) and no additional therapies due to lack of insurance at that time;  DM type 2 with peripheral neuropathy;  seizures    Limitations  Other (comment)    Patient Stated Goals  Improve walking and balance    Currently in Pain?  No/denies                       [facility] Adult PT Treatment/Exercise - 12/30/18 0001      Ambulation/Gait   Ambulation/Gait  Yes    Ambulation/Gait Assistance  4: Min guard    Ambulation Distance (Feet)  300 Feet    Assistive device  None    Gait Pattern  Step-through pattern    Ambulation Surface  Level;Unlevel;Paved;Grass;Indoor;Outdoor    Stairs  Yes    Stairs Assistance  5: Supervision    Stair Management Technique  One rail Right;Alternating pattern;Forwards    Number of Stairs  4   2 reps = 8 steps total   Height of Stairs  6    Gait Comments  Practiced walking up and down steep hill outside across from clinic - cues for pt to keep feet close together and to lean forward during ascension and lean slightly posterior during descension of hill  Pt practiced negotiating hill outside (steep hill) across clinic, behind [place] - approx. 6 reps up/down hill with CGA to min assist with ascension/descension     Balance Exercises - 12/30/18 1250      Balance Exercises: Standing   Standing Eyes Opened  Wide (BOA);Head turns;Foam/compliant surface;Solid surface;5 reps    Standing Eyes Closed  Wide (BOA);Head turns;Foam/compliant surface;5 reps      Above activities performed on incline/decline; added blue mat for increased challenge with balance after initially performing Balance activities on solid surface  Marching in place - EO and EC on incline and on decline; progressed from solid surface to standing on mat for compliant surface training   PT Education - 12/30/18 1254    Education Details  pt instructed to  practice walking up and down hill at home    Person(s) Educated  Patient;Caregiver(s)    Methods  Explanation;Demonstration    Comprehension  Verbalized understanding;Returned demonstration          PT Long Term Goals - 11/18/18 1022      PT LONG TERM GOAL #1   Title  Pt will increase DGI score by at least 5 points to demo improved gait.    Time  4    Period  Weeks    Status  On-going    Target Date  12/18/18      PT LONG TERM GOAL #2   Title  Increase distance in 6" walk test by at least 100' for increased endurance and gait speed.    Time  4    Period  Weeks    Status  On-going    Target Date  12/18/18      PT LONG TERM GOAL #3   Title  Pt will negotiate steps with 1 rail only using a step over step sequence; sister will report pt negotiating steps at home in this manner (states he currently hugs the rail with both hands due to fear of falling).     Baseline  sister states he continues to be fearful and hold onto rail with both hands at home but is improving with step negotiation at [facility] - 11-17-18    Time  4    Period  Weeks    Status  Partially Met    Target Date  12/18/18      PT LONG TERM GOAL #4   Title  Increase gait velocity from 3.35 ft/sec to >/= 3.8 ft/sec for increased gait efficiency.     Baseline  8.34 secs = 3.93 ft/sec on 11-17-18    Time  4    Period  Weeks    Status  Achieved      PT LONG TERM GOAL #5   Title  Improve TUG score from 13.58 secs to </= 11 secs for improved functional mobility.      Baseline  16.54, 13.58 secs;  9.00 secs on 11-17-18    Time  4    Period  Weeks    Status  Achieved      PT LONG TERM GOAL #6   Title  Amb. 300' outside on paved uneven surface and on grassy terrain with supervision without LOB.    Baseline  met 11-17-18    Status  Achieved      PT LONG TERM GOAL #7   Title  Perform HEP with caregiver assistance prn due to cognitive deficits.    Baseline  met 11-17-18    Status  Achieved  PT LONG TERM GOAL #8    Title  Pt will jog 200' on flat even surface with supervision.    Time  4    Period  Weeks    Status  New    Target Date  12/18/18      PT LONG TERM GOAL  #9   TITLE  Pt will demonstrate improved gait pattern with reduced gait deviaitons by amb. with decreased steppage gait RLE with increased arm swing with no cues needed for initial heel contact RLE.    Time  4    Period  Weeks    Status  New    Target Date  12/18/18            Plan - 12/30/18 1251    Clinical Impression Statement  Pt has difficulty with walking up steep hill outside (grassy surface); pt keeps legs wide apart and needs cues to bring legs closer, lean forward and push with legs during ascension of hill.  Pt does better with descension of hill and does not appear to be as fearful of falling with walking down as he does with walking up hill.      Rehab Potential  Good    Clinical Impairments Affecting Rehab Potential  language barrier and cognitive deficits - pt's sister [name] interpreted at eval    PT Frequency  2x / week    PT Duration  6 weeks    PT Treatment/Interventions  ADLs/Self Care Home Management;Aquatic Therapy;Gait training;Stair training;Therapeutic activities;Therapeutic exercise;Balance training;Neuromuscular re-education;Patient/family education    PT Next Visit Plan  give quadruped for HEP; cont balance and gait    PT Home Exercise Plan   [id number]; added side, forward and back kicks and braiding from [facility] on 10-27-18    Consulted and Agree with Plan of Care  Patient;Family member/caregiver    Family Member Consulted  sister [name]       Patient will benefit from skilled therapeutic intervention in order to improve the following deficits and impairments:  Abnormal gait, Decreased activity tolerance, Decreased balance, Decreased cognition, Decreased coordination, Decreased strength  Visit Diagnosis: Other abnormalities of gait and mobility  Unsteadiness on feet     Problem List Patient  Active Problem List   Diagnosis Date Noted  . Microalbuminuria 12/15/2018  . Hypothyroidism 09/21/2018  . Seizure disorder ([place]) 06/16/2018  . Low testosterone 06/16/2018  . Traumatic brain injury with loss of consciousness ([place]) 10/29/2017  . Anemia 10/29/2017  . Positive depression screening 10/29/2017  . Attention and concentration deficit   . Type 2 diabetes mellitus with peripheral neuropathy (HCC)   . S/P craniotomy 03/19/2017  . Essential hypertension   . Dysphagia, oropharyngeal 02/01/2017  . Diabetes type 2, uncontrolled ([place]) 06/13/2015    Rimas Gilham, [place], PT 12/30/2018, 12:55 PM  [facility] [address] [place], [place], [address] Phone: [phone]   Fax:  [phone]  Name: [name] MRN: [id number] Date of Birth: 1966/09/30

## 2019-01-03 ENCOUNTER — Ambulatory Visit: Payer: Self-pay | Attending: Physical Medicine & Rehabilitation | Admitting: Occupational Therapy

## 2019-01-03 ENCOUNTER — Ambulatory Visit: Payer: Self-pay | Admitting: Physical Therapy

## 2019-01-03 ENCOUNTER — Encounter: Payer: Self-pay | Admitting: Occupational Therapy

## 2019-01-03 DIAGNOSIS — R278 Other lack of coordination: Secondary | ICD-10-CM | POA: Insufficient documentation

## 2019-01-03 DIAGNOSIS — R41844 Frontal lobe and executive function deficit: Secondary | ICD-10-CM | POA: Insufficient documentation

## 2019-01-03 DIAGNOSIS — R2681 Unsteadiness on feet: Secondary | ICD-10-CM

## 2019-01-03 DIAGNOSIS — R2689 Other abnormalities of gait and mobility: Secondary | ICD-10-CM

## 2019-01-03 DIAGNOSIS — M6281 Muscle weakness (generalized): Secondary | ICD-10-CM | POA: Insufficient documentation

## 2019-01-03 DIAGNOSIS — R41840 Attention and concentration deficit: Secondary | ICD-10-CM | POA: Insufficient documentation

## 2019-01-03 NOTE — Therapy (Signed)
[facility] [address] [place], [place], [address] Phone: [phone]   Fax:  [phone]  Occupational Therapy Treatment  Patient Details  Name: [name] MRN: [id number] Date of Birth: January 10, 1966 Referring Provider (OT): Dr. Alger Simons   Encounter Date: 01/03/2019  OT End of Session - 01/03/19 0944    Visit Number  13    Number of Visits  25    Date for OT Re-Evaluation  02/13/19    Authorization Type  [facility] 08/01/18-01/03/19     OT Start Time  0939    OT Stop Time  1020    OT Time Calculation (min)  41 min    Activity Tolerance  Patient tolerated treatment well    Behavior During Therapy  [facility] for tasks assessed/performed;Flat affect       Past Medical History:  Diagnosis Date  . Diabetes mellitus without complication ([place])   . Seizures ([place])   . TBI (traumatic brain injury) ([place]) 03/2017    Past Surgical History:  Procedure Laterality Date  . CRANIOTOMY Right 01/13/2017   Procedure: 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, Placement of bone flap in abdomen;  Surgeon: Ashok Pall, MD;  Location: [facility];  Service: Neurosurgery;  Laterality: Right;  . CRANIOTOMY Right 03/19/2017   Procedure: Cranioplasty with placement of bone flap from abdomen;  Surgeon: Eustace Moore, MD;  Location: [facility];  Service: Neurosurgery;  Laterality: Right;    There were no vitals filed for this visit.  Subjective Assessment - 01/03/19 0943    Subjective   Sister reports that pt made sandwiches and folded clothes, but that she needed to re-fold some clothes and pt needed cueing for initiation    Patient is accompanied by:  Family member;Interpreter    Pertinent History  DM, HTN, seizure disorder, hypothyroidism    Patient Stated Goals  "to be well"    Currently in Pain?  No/denies        Completing [place] Pegboard with min cueing for sequencing for attention, sequencing, L  hand coordination.--improved performance with less cueing  For attention and sequencing.  Constant Therapy:  Symbol Matching for visual scanning level 3 with 97% and level 6 with 74% accuracy with min cueing for attention, look to the L and initially for directions.  Functional reaching to place/remove clothespins on vertical pole by color for visual scanning, and incr coordination/strength with min cueing.  Discussed progress/performance with pt to incr awareness via interpreter  Continue to recommend pt incr participation with simple, safe meal prep task, simple IADLs and use of schedule to help with initiation.     OT Short Term Goals - 12/29/18 0954      OT SHORT TERM GOAL #1   Title  Pt will be independent with initial HEP.--check STGs 12/27/18    Time  6    Period  Weeks    Status  Partially Met   12/29/18:  pt/family, pt needs supervision/cueing from family (but sister able to provide cueing/supervision)     OT SHORT TERM GOAL #2   Title  Pt will improve L grip strength by at least 5lbs to assit with IADLs.      Baseline  20lbs    Time  6    Period  Weeks    Status  Achieved   12/27/18  46lbs     OT SHORT TERM GOAL #3   Title  Pt will be improve LUE control/strength to  able to performing targeted overhead reaching with LUE x 30mn without drops.    Time  6    Period  Weeks    Status  Achieved   12/29/18     OT SHORT TERM GOAL #4   Title  Pt/family will verbalize understanding of cognitive compensation strategies for ADLs/IADLs.    Time  6    Period  Weeks    Status  Achieved   12/29/18 with pt/family     OT SHORT TERM GOAL #5   Title  Pt will attend to functional cognitive tasks for at least 10 min with only min cueing for problem solving.    Time  6    Period  Weeks    Status  On-going   12/27/18:  2-567m, mod cueing (dependent on task)       OT Long Term Goals - 12/28/18 2136      OT LONG TERM GOAL #1   Title  Pt will be independent with updated HEP.--check  LTGs 02/13/19    Time  12    Period  Weeks    Status  New      OT LONG TERM GOAL #2   Title  Pt will improve L grip strength by at least 10lbs to assit with IADLs.      Time  12    Period  Weeks    Status  New      OT LONG TERM GOAL #3   Title  Pt will be able to retrieve/replace 3lb object on overhead shelf with LUE x3 with good safety/control.    Time  12    Period  Weeks    Status  New      OT LONG TERM GOAL #4   Title  Pt will perform BADLs and simple selected home maintenance tasks with distant supervision using schedule/strategies prn.    Time  12    Period  Weeks    Status  New      OT LONG TERM GOAL #5   Title  Pt will perform simple snack prep, use microwave, cold meal prep and make appropriate choices mod I.     Time  12    Period  Weeks    Status  New            Plan - 01/03/19 0954    Clinical Impression Statement  Pt with improving coordination and less cueing to continue with functional tasks today.      Occupational Profile and client history currently impacting functional performance  Pt was independent and working prior to injury; however, now pt needs set-up/supervision and cueing for ADLs/IADLs.  Pt enjoyed wrestling and soccer prior to injury, but is now unable to engage in these activities.    Occupational performance deficits (Please refer to evaluation for details):  ADL's;IADL's;Leisure;Work;Social Participation    Rehab Potential  Fair    OT Frequency  2x / week    OT Duration  12 weeks    OT Treatment/Interventions  Self-care/ADL training;Electrical Stimulation;Therapeutic exercise;Visual/perceptual remediation/compensation;Aquatic Therapy;Moist Heat;Paraffin;Neuromuscular education;Patient/family education;Splinting;Fluidtherapy;Energy conservation;FuTherapist, nutritionalherapeutic activities;Cryotherapy;Ultrasound;DME and/or AE instruction;Contrast Bath;Manual Therapy;Passive range of motion;Cognitive remediation/compensation;Balance  training    Plan  continue to address cogntion, problem solving, functional IADL tasks, LUE functional use (hold for approx 2 weeks to await extension of financial assist per pt/sister request)    Consulted and Agree with Plan of Care  Family member/caregiver;Patient;Other (Comment)    Family Member Consulted  sister, interpreter  Patient will benefit from skilled therapeutic intervention in order to improve the following deficits and impairments:     Visit Diagnosis: Attention and concentration deficit  Frontal lobe and executive function deficit  Other lack of coordination  Muscle weakness (generalized)  Unsteadiness on feet  Other abnormalities of gait and mobility    Problem List Patient Active Problem List   Diagnosis Date Noted  . Microalbuminuria 12/15/2018  . Hypothyroidism 09/21/2018  . Seizure disorder ([place]) 06/16/2018  . Low testosterone 06/16/2018  . Traumatic brain injury with loss of consciousness ([place]) 10/29/2017  . Anemia 10/29/2017  . Positive depression screening 10/29/2017  . Attention and concentration deficit   . Type 2 diabetes mellitus with peripheral neuropathy ([facility])   . S/P craniotomy 03/19/2017  . Essential hypertension   . Dysphagia, oropharyngeal 02/01/2017  . Diabetes type 2, uncontrolled ([place]) 06/13/2015    [facility] 01/03/2019, 11:32 AM  [facility] [address] [place], [place], [address] Phone: [phone]   Fax:  [phone]  Name: Tavious Griesinger MRN: [id number] Date of Birth: 05-21-1966   Vianne Bulls, OTR/L [facility] [address] [place], [place]  [address] [phone] phone [phone] 01/03/19 11:32 AM

## 2019-01-04 ENCOUNTER — Ambulatory Visit: Payer: Self-pay | Attending: Internal Medicine

## 2019-01-04 NOTE — Therapy (Addendum)
[facility] [address] [place], [place], [address] Phone: [phone]   Fax:  [phone]  Physical Therapy Treatment/ D/C Summary  Patient Details  Name: [name] MRN: [id number] Date of Birth: [birth year] Referring Provider (PT): Dr. Alger Simons   Encounter Date: 01/03/2019  PT End of Session - 01/05/19 0918    Visit Number  20    Number of Visits  21    Date for PT Re-Evaluation  01/03/19    Authorization Type  [facility] 08-01-18 - 01-03-19; renewal completed for 8 additional visits 11-17-17 - 12-18-18    Authorization Time Period  08-01-18 - 01-03-19    PT Start Time  1018    PT Stop Time  1103    PT Time Calculation (min)  45 min    Equipment Utilized During Treatment  Gait belt    Activity Tolerance  Patient tolerated treatment well    Behavior During Therapy  [facility] for tasks assessed/performed;Flat affect       Past Medical History:  Diagnosis Date  . Diabetes mellitus without complication ([place])   . Seizures ([place])   . TBI (traumatic brain injury) ([place]) 03/2017    Past Surgical History:  Procedure Laterality Date  . CRANIOTOMY Right 01/13/2017   Procedure: 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, Placement of bone flap in abdomen;  Surgeon: Ashok Pall, MD;  Location: [facility];  Service: Neurosurgery;  Laterality: Right;  . CRANIOTOMY Right 03/19/2017   Procedure: Cranioplasty with placement of bone flap from abdomen;  Surgeon: Eustace Moore, MD;  Location: [facility];  Service: Neurosurgery;  Laterality: Right;    There were no vitals filed for this visit.  Subjective Assessment - 01/04/19 2203    Subjective  Sister reports pt has made alot of progress - he is now doing things that he used to not be able to do; she also states that he is walking much better; ready for discharge     Patient is accompained by:  Family member    Pertinent History  TBI secondary to SDH due  to fall on 01-10-17; Rt craniectomy for hematoma on 01-13-17; Cranioplasty with placement of bone flap from abdomen on 03-19-17; pt received HH (few visits per sister's report) and no additional therapies due to lack of insurance at that time;  DM type 2 with peripheral neuropathy;  seizures    Patient Stated Goals  Improve walking and balance    Currently in Pain?  No/denies                       [facility] Adult PT Treatment/Exercise - 01/05/19 0001      Ambulation/Gait   Gait velocity  8.19 secs = 4.0 ft/sec without device     Stairs  Yes    Stairs Assistance  5: Supervision    Stair Management Technique  One rail Right;Forwards;Alternating pattern    Number of Stairs  4    Height of Stairs  6      Dynamic Gait Index   Level Surface  Mild Impairment    Change in Gait Speed  Mild Impairment    Gait with Horizontal Head Turns  Mild Impairment    Gait with Vertical Head Turns  Mild Impairment    Gait and Pivot Turn  Normal    Step Over Obstacle  Mild Impairment    Step Around Obstacles  Normal    Steps  Mild  Impairment    Total Score  18      Timed Up and Go Test   Normal TUG (seconds)  11.03        Pt jogged 230' nonstop around track in clinic gym - no LOB occurred  Reviewed LTG's and progress with patient and sister - sister states she is very pleased with pt's progress      PT Education - 01/05/19 0915    Education Details  normal gait pattern reviewed with sister - increase step length, decrease BOS, and increase arm swing - sister reports understanding and states that she gives him reminders frequently on his walking pattern    Person(s) Educated  Patient;Caregiver(s)    Methods  Explanation    Comprehension  Verbalized understanding          PT Long Term Goals - 01/03/19 1028      PT LONG TERM GOAL #1   Title  Pt will increase DGI score by at least 5 points to demo improved gait.    Baseline  18/24 on 01-03-19; initial score 13/24    Status  Achieved       PT LONG TERM GOAL #2   Title  Increase distance in 6" walk test by at least 100' for increased endurance and gait speed.    Baseline  1066' on 11-02-19; goal deferred since gait speed and endurance are no longer problems for him    Status  Deferred      PT LONG TERM GOAL #3   Title  Pt will negotiate steps with 1 rail only using a step over step sequence; sister will report pt negotiating steps at home in this manner (states he currently hugs the rail with both hands due to fear of falling).     Baseline  met 01-03-19    Status  Achieved      PT LONG TERM GOAL #4   Title  Increase gait velocity from 3.35 ft/sec to >/= 3.8 ft/sec for increased gait efficiency.     Baseline  8.34 secs = 3.93 ft/sec on 11-17-18;     8.19 secs =4.00 ft/sec without device on 01-03-19    Status  Achieved      PT LONG TERM GOAL #5   Title  Improve TUG score from 13.58 secs to </= 11 secs for improved functional mobility.      Baseline  16.54, 13.58 secs;  9.00 secs on 11-17-18; 11.03 secs on 01-03-19    Status  Achieved      PT LONG TERM GOAL #6   Title  Amb. 300' outside on paved uneven surface and on grassy terrain with supervision without LOB.    Baseline  met 01-03-19    Status  Achieved      PT LONG TERM GOAL #7   Title  Perform HEP with caregiver assistance prn due to cognitive deficits.    Status  Achieved      PT LONG TERM GOAL #8   Title  Pt will jog 200' on flat even surface with supervision.    Baseline  met 01-03-19    Status  Achieved      PT LONG TERM GOAL  #9   TITLE  Pt will demonstrate improved gait pattern with reduced gait deviaitons by amb. with decreased steppage gait RLE with increased arm swing with no cues needed for initial heel contact RLE.    Baseline  met 01-03-19    Status  Achieved  Plan - 01/05/19 0918    Clinical Impression Statement  Pt has met LTG's #1 and #3-9:  LTG #2 deferred due to gait velocity and endurance are no longer deficits for patient.  Pt has  made good progress and is ready for discharge at this time - pt and sister agree with D/C plan.      Rehab Potential  Good    Clinical Impairments Affecting Rehab Potential  language barrier and cognitive deficits - pt's sister [name] interpreted at eval    PT Frequency  2x / week    PT Duration  6 weeks    PT Treatment/Interventions  ADLs/Self Care Home Management;Aquatic Therapy;Gait training;Stair training;Therapeutic activities;Therapeutic exercise;Balance training;Neuromuscular re-education;Patient/family education    PT Next Visit Plan  D/C    PT Home Exercise Plan   [id number]; added side, forward and back kicks and braiding from [facility] on 10-27-18    Consulted and Agree with Plan of Care  Patient;Family member/caregiver    Family Member Consulted  sister [name]       Patient will benefit from skilled therapeutic intervention in order to improve the following deficits and impairments:  Abnormal gait, Decreased activity tolerance, Decreased balance, Decreased cognition, Decreased coordination, Decreased strength  Visit Diagnosis: Other abnormalities of gait and mobility  Unsteadiness on feet  Muscle weakness (generalized)     Problem List Patient Active Problem List   Diagnosis Date Noted  . Microalbuminuria 12/15/2018  . Hypothyroidism 09/21/2018  . Seizure disorder ([place]) 06/16/2018  . Low testosterone 06/16/2018  . Traumatic brain injury with loss of consciousness ([place]) 10/29/2017  . Anemia 10/29/2017  . Positive depression screening 10/29/2017  . Attention and concentration deficit   . Type 2 diabetes mellitus with peripheral neuropathy ([facility])   . S/P craniotomy 03/19/2017  . Essential hypertension   . Dysphagia, oropharyngeal 02/01/2017  . Diabetes type 2, uncontrolled ([place]) 06/13/2015      PHYSICAL THERAPY DISCHARGE SUMMARY  Visits from Start of Care: 20  Current functional level related to goals / functional outcomes: PT Long Term Goals - 01/03/19 1028       PT LONG TERM GOAL #1   Title  Pt will increase DGI score by at least 5 points to demo improved gait.    Baseline  18/24 on 01-03-19; initial score 13/24    Status  Achieved      PT LONG TERM GOAL #2   Title  Increase distance in 6" walk test by at least 100' for increased endurance and gait speed.    Baseline  1066' on 11-02-19; goal deferred since gait speed and endurance are no longer problems for him    Status  Deferred      PT LONG TERM GOAL #3   Title  Pt will negotiate steps with 1 rail only using a step over step sequence; sister will report pt negotiating steps at home in this manner (states he currently hugs the rail with both hands due to fear of falling).     Baseline  met 01-03-19    Status  Achieved      PT LONG TERM GOAL #4   Title  Increase gait velocity from 3.35 ft/sec to >/= 3.8 ft/sec for increased gait efficiency.     Baseline  8.34 secs = 3.93 ft/sec on 11-17-18;     8.19 secs =4.00 ft/sec without device on 01-03-19    Status  Achieved      PT LONG TERM GOAL #5   Title  Improve TUG score from 13.58 secs to </= 11 secs for improved functional mobility.      Baseline  16.54, 13.58 secs;  9.00 secs on 11-17-18; 11.03 secs on 01-03-19    Status  Achieved      PT LONG TERM GOAL #6   Title  Amb. 300' outside on paved uneven surface and on grassy terrain with supervision without LOB.    Baseline  met 01-03-19    Status  Achieved      PT LONG TERM GOAL #7   Title  Perform HEP with caregiver assistance prn due to cognitive deficits.    Status  Achieved      PT LONG TERM GOAL #8   Title  Pt will jog 200' on flat even surface with supervision.    Baseline  met 01-03-19    Status  Achieved      PT LONG TERM GOAL  #9   TITLE  Pt will demonstrate improved gait pattern with reduced gait deviaitons by amb. with decreased steppage gait RLE with increased arm swing with no cues needed for initial heel contact RLE.    Baseline  met 01-03-19    Status  Achieved        Remaining  deficits: Continued mild gait deviations with decreased high level balance skills; Decreased coordination RLE compared to LLE   Education / Equipment: Pt has been instructed in HEP for balance exercises; sister reports pt goes to [facility] for exercise program (accompanied by a family member)  Plan: Patient agrees to discharge.  Patient goals were met. Patient is being discharged due to meeting the stated rehab goals.  ?????    Pt has maximized functional progress at this time.  No further needs identified at this time (regarding PT).        CZYSAY, TKZSW FUXNATF, PT 01/05/2019, 9:22 AM  [facility] [address] [place], [place], [address] Phone: [phone]   Fax:  [phone]  Name: [name] MRN: [id number] Date of Birth: 07-21-1966

## 2019-01-05 ENCOUNTER — Encounter: Payer: Self-pay | Admitting: Occupational Therapy

## 2019-01-05 ENCOUNTER — Ambulatory Visit: Payer: Self-pay | Admitting: Physical Therapy

## 2019-01-09 MED FILL — LEVETIRACETAM ER 500 MG TAB: 500 | 30 days supply | Qty: 60 | Fill #3

## 2019-01-09 MED FILL — ?ATORVASTATIN 10 MG TABLET: 10 | 30 days supply | Qty: 30 | Fill #4

## 2019-01-09 MED FILL — LISINOPRIL 5 MG TAB: 5 | 30 days supply | Qty: 15 | Fill #1

## 2019-01-09 MED FILL — ?METFORMIN HCL 1000 MG TAB: 1000 | 30 days supply | Qty: 60 | Fill #8

## 2019-01-10 ENCOUNTER — Ambulatory Visit: Payer: Self-pay

## 2019-01-10 ENCOUNTER — Encounter: Payer: Self-pay | Admitting: Occupational Therapy

## 2019-01-13 ENCOUNTER — Encounter: Payer: Self-pay | Admitting: Occupational Therapy

## 2019-01-17 ENCOUNTER — Other Ambulatory Visit: Payer: Self-pay

## 2019-01-17 ENCOUNTER — Encounter: Payer: Self-pay | Admitting: Occupational Therapy

## 2019-01-17 ENCOUNTER — Ambulatory Visit: Payer: Self-pay | Admitting: Occupational Therapy

## 2019-01-17 ENCOUNTER — Telehealth: Payer: Self-pay | Admitting: Internal Medicine

## 2019-01-17 DIAGNOSIS — R278 Other lack of coordination: Secondary | ICD-10-CM

## 2019-01-17 DIAGNOSIS — R2681 Unsteadiness on feet: Secondary | ICD-10-CM

## 2019-01-17 DIAGNOSIS — R41840 Attention and concentration deficit: Secondary | ICD-10-CM

## 2019-01-17 DIAGNOSIS — R2689 Other abnormalities of gait and mobility: Secondary | ICD-10-CM

## 2019-01-17 DIAGNOSIS — R41844 Frontal lobe and executive function deficit: Secondary | ICD-10-CM

## 2019-01-17 DIAGNOSIS — M6281 Muscle weakness (generalized): Secondary | ICD-10-CM

## 2019-01-17 NOTE — Therapy (Signed)
[facility] [address] [place], [place], [address] Phone: [phone]   Fax:  [phone]  Occupational Therapy Treatment  Patient Details  Name: [name] MRN: [id number] Date of Birth: 1966-05-15 Referring Provider (OT): Dr. Alger Simons   Encounter Date: 01/17/2019  OT End of Session - 01/17/19 0805    Visit Number  14    Number of Visits  25    Date for OT Re-Evaluation  02/13/19    Authorization Type  [facility] [employer] 08/01/18-01/03/19     OT Start Time  0751    OT Stop Time  0830    OT Time Calculation (min)  39 min    Activity Tolerance  Patient tolerated treatment well    Behavior During Therapy  [facility] for tasks assessed/performed;Flat affect       Past Medical History:  Diagnosis Date  . Diabetes mellitus without complication ([place])   . Seizures ([place])   . TBI (traumatic brain injury) ([place]) 03/2017    Past Surgical History:  Procedure Laterality Date  . CRANIOTOMY Right 01/13/2017   Procedure: 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, Placement of bone flap in abdomen;  Surgeon: Ashok Pall, MD;  Location: [facility];  Service: Neurosurgery;  Laterality: Right;  . CRANIOTOMY Right 03/19/2017   Procedure: Cranioplasty with placement of bone flap from abdomen;  Surgeon: Eustace Moore, MD;  Location: [facility];  Service: Neurosurgery;  Laterality: Right;    There were no vitals filed for this visit.  Subjective Assessment - 01/17/19 0756    Subjective   Sister reports that she continues that pt needs cueing to look at calendar and to continue tasks once he's started    Patient is accompanied by:  Family member;Interpreter    Pertinent History  DM, HTN, seizure disorder, hypothyroidism    Patient Stated Goals  "to be well"    Currently in Pain?  No/denies       Matching clock faces with digital times (approx 16 clock faces on tabletop with digital times positioned on L  side).  Initially with min-mod cueing for problem-solving/visual scanning, but then min cueing.  Recommended sister cue/speak less to improve pt attention to task and encourage problem-solving.  Functional reaching to place/remove clothespins with 1-8lb resistance on vertical pole in standing for incr coordination and pinch strength.  Pt directed to put on by color and demo difficulty processing when given 2 color sequence.  Pt needed cueing/direction repeated.     Recommended alternating cognitive demanding activities with physical activities to incr success due to decr attention.    OT Education - 01/17/19 [phone]    Education Details  Recommended reduce cueing, more questioning cueing to encourage problem solving, allow pt more time to process.  Pt educated in rational of cognitive compensation strategies to incr awareness.  Recommended pt set alarms on phone for cueing (sister to provide assist prn).  Reviewed with sister that pt will continue to need help to utilize memory stragegies.  Emphasized use of schedule at home for home maintenance tasks.    Person(s) Educated  Patient;Caregiver(s)    Methods  Explanation    Comprehension  Verbalized understanding;Verbal cues required       OT Short Term Goals - 12/29/18 0954      OT SHORT TERM GOAL #1   Title  Pt will be independent with initial HEP.--check STGs 12/27/18    Time  6    Period  Weeks  Status  Partially Met   12/29/18:  pt/family, pt needs supervision/cueing from family (but sister able to provide cueing/supervision)     OT SHORT TERM GOAL #2   Title  Pt will improve L grip strength by at least 5lbs to assit with IADLs.      Baseline  20lbs    Time  6    Period  Weeks    Status  Achieved   12/27/18  46lbs     OT SHORT TERM GOAL #3   Title  Pt will be improve LUE control/strength to able to performing targeted overhead reaching with LUE x 70mn without drops.    Time  6    Period  Weeks    Status  Achieved   12/29/18      OT SHORT TERM GOAL #4   Title  Pt/family will verbalize understanding of cognitive compensation strategies for ADLs/IADLs.    Time  6    Period  Weeks    Status  Achieved   12/29/18 with pt/family     OT SHORT TERM GOAL #5   Title  Pt will attend to functional cognitive tasks for at least 10 min with only min cueing for problem solving.    Time  6    Period  Weeks    Status  On-going   12/27/18:  2-518m, mod cueing (dependent on task)       OT Long Term Goals - 12/28/18 2136      OT LONG TERM GOAL #1   Title  Pt will be independent with updated HEP.--check LTGs 02/13/19    Time  12    Period  Weeks    Status  New      OT LONG TERM GOAL #2   Title  Pt will improve L grip strength by at least 10lbs to assit with IADLs.      Time  12    Period  Weeks    Status  New      OT LONG TERM GOAL #3   Title  Pt will be able to retrieve/replace 3lb object on overhead shelf with LUE x3 with good safety/control.    Time  12    Period  Weeks    Status  New      OT LONG TERM GOAL #4   Title  Pt will perform BADLs and simple selected home maintenance tasks with distant supervision using schedule/strategies prn.    Time  12    Period  Weeks    Status  New      OT LONG TERM GOAL #5   Title  Pt will perform simple snack prep, use microwave, cold meal prep and make appropriate choices mod I.     Time  12    Period  Weeks    Status  New            Plan - 01/17/19 0806    Clinical Impression Statement  Pt with improving coordination and less cueing to continue with functional tasks today.   Pt needs less cueing to continue tasks with minimal distractions.  Recommended sister reduce amount of cueing/type of cueing and external distractions to help with this.    Occupational Profile and client history currently impacting functional performance  Pt was independent and working prior to injury; however, now pt needs set-up/supervision and cueing for ADLs/IADLs.  Pt enjoyed wrestling and  soccer prior to injury, but is now unable to engage in these activities.  Occupational performance deficits (Please refer to evaluation for details):  ADL's;IADL's;Leisure;Work;Social Participation    Rehab Potential  Fair    OT Frequency  2x / week    OT Duration  12 weeks    OT Treatment/Interventions  Self-care/ADL training;Electrical Stimulation;Therapeutic exercise;Visual/perceptual remediation/compensation;Aquatic Therapy;Moist Heat;Paraffin;Neuromuscular education;Patient/family education;Splinting;Fluidtherapy;Energy conservation;Therapist, nutritional;Therapeutic activities;Cryotherapy;Ultrasound;DME and/or AE instruction;Contrast Bath;Manual Therapy;Passive range of motion;Cognitive remediation/compensation;Balance training    Plan  continue to address cogntion, problem solving, functional IADL tasks, LUE functional use    Consulted and Agree with Plan of Care  Family member/caregiver;Patient;Other (Comment)    Family Member Consulted  sister, interpreter       Patient will benefit from skilled therapeutic intervention in order to improve the following deficits and impairments:     Visit Diagnosis: Attention and concentration deficit  Frontal lobe and executive function deficit  Other lack of coordination  Muscle weakness (generalized)  Unsteadiness on feet  Other abnormalities of gait and mobility    Problem List Patient Active Problem List   Diagnosis Date Noted  . Microalbuminuria 12/15/2018  . Hypothyroidism 09/21/2018  . Seizure disorder ([place]) 06/16/2018  . Low testosterone 06/16/2018  . Traumatic brain injury with loss of consciousness ([place]) 10/29/2017  . Anemia 10/29/2017  . Positive depression screening 10/29/2017  . Attention and concentration deficit   . Type 2 diabetes mellitus with peripheral neuropathy (H[facility])   . S/P craniotomy 03/19/2017  . Essential hypertension   . Dysphagia, oropharyngeal 02/01/2017  . Diabetes type 2, uncontrolled ([place])  06/13/2015    [facility] 01/17/2019, 10:14 AM  [facility] [address] [place], [place], [address] Phone: [phone]   Fax:  [phone]  Name: Elam Ellis MRN: [id number] Date of Birth: 1966-03-10   Vianne Bulls, OTR/L [facility] [address] [place], [place]  [address] [phone] phone [phone] 01/17/19 10:14 AM

## 2019-01-17 NOTE — Telephone Encounter (Signed)
I called Pt sister to informed her that his brother lertter is ready and it will be at the FO

## 2019-01-17 NOTE — Telephone Encounter (Signed)
Pt sister called in stating that her brother has appointment with [facility] 01/17/19 and has not received  Discount letter would like to know if she could come pick up please follow up

## 2019-01-18 ENCOUNTER — Ambulatory Visit: Payer: Self-pay | Admitting: Occupational Therapy

## 2019-01-18 DIAGNOSIS — R41840 Attention and concentration deficit: Secondary | ICD-10-CM

## 2019-01-18 DIAGNOSIS — R41844 Frontal lobe and executive function deficit: Secondary | ICD-10-CM

## 2019-01-18 DIAGNOSIS — M6281 Muscle weakness (generalized): Secondary | ICD-10-CM

## 2019-01-18 DIAGNOSIS — R278 Other lack of coordination: Secondary | ICD-10-CM

## 2019-01-18 NOTE — Therapy (Signed)
[facility] [address] [place], [place], [address] Phone: [phone]   Fax:  [phone]  Occupational Therapy Treatment  Patient Details  Name: [name] MRN: [id number] Date of Birth: October 27, 1966 Referring Provider (OT): Dr. Alger Simons   Encounter Date: 01/18/2019  OT End of Session - 01/18/19 0853    Visit Number  15    Number of Visits  25    Date for OT Re-Evaluation  02/13/19    Authorization Type  [facility] 08/01/18-01/03/19     OT Start Time  0851    OT Stop Time  0930    OT Time Calculation (min)  39 min    Activity Tolerance  Patient tolerated treatment well    Behavior During Therapy  [facility] for tasks assessed/performed;Flat affect       Past Medical History:  Diagnosis Date  . Diabetes mellitus without complication ([place])   . Seizures ([place])   . TBI (traumatic brain injury) ([place]) 03/2017    Past Surgical History:  Procedure Laterality Date  . CRANIOTOMY Right 01/13/2017   Procedure: 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, Placement of bone flap in abdomen;  Surgeon: Ashok Pall, MD;  Location: [facility];  Service: Neurosurgery;  Laterality: Right;  . CRANIOTOMY Right 03/19/2017   Procedure: Cranioplasty with placement of bone flap from abdomen;  Surgeon: Eustace Moore, MD;  Location: [facility];  Service: Neurosurgery;  Laterality: Right;    There were no vitals filed for this visit.  Subjective Assessment - 01/18/19 0852    Pertinent History  DM, HTN, seizure disorder, hypothyroidism    Patient Stated Goals  "to be well"    Currently in Pain?  No/denies                 Treatment Copying small peg design on vertical surface with LUE for increased fine motor coordination and functional reach with LUE, min-mod v.c  for design, min difficulty for placement, improved perfromance today with improved attention to task. shape matching on constant therapy-  81 % correct, for improved scanning min v.c for performance/ organized scan pattern Arm bike x 6 mins level 4 for conditioning             OT Short Term Goals - 12/29/18 0954      OT SHORT TERM GOAL #1   Title  Pt will be independent with initial HEP.--check STGs 12/27/18    Time  6    Period  Weeks    Status  Partially Met   12/29/18:  pt/family, pt needs supervision/cueing from family (but sister able to provide cueing/supervision)     OT SHORT TERM GOAL #2   Title  Pt will improve L grip strength by at least 5lbs to assit with IADLs.      Baseline  20lbs    Time  6    Period  Weeks    Status  Achieved   12/27/18  46lbs     OT SHORT TERM GOAL #3   Title  Pt will be improve LUE control/strength to able to performing targeted overhead reaching with LUE x 41mn without drops.    Time  6    Period  Weeks    Status  Achieved   12/29/18     OT SHORT TERM GOAL #4   Title  Pt/family will verbalize understanding of cognitive compensation strategies for ADLs/IADLs.    Time  6    Period  Weeks    Status  Achieved   12/29/18 with pt/family     OT SHORT TERM GOAL #5   Title  Pt will attend to functional cognitive tasks for at least 10 min with only min cueing for problem solving.    Time  6    Period  Weeks    Status  On-going   12/27/18:  2-85mn, mod cueing (dependent on task)       OT Long Term Goals - 12/28/18 2136      OT LONG TERM GOAL #1   Title  Pt will be independent with updated HEP.--check LTGs 02/13/19    Time  12    Period  Weeks    Status  New      OT LONG TERM GOAL #2   Title  Pt will improve L grip strength by at least 10lbs to assit with IADLs.      Time  12    Period  Weeks    Status  New      OT LONG TERM GOAL #3   Title  Pt will be able to retrieve/replace 3lb object on overhead shelf with LUE x3 with good safety/control.    Time  12    Period  Weeks    Status  New      OT LONG TERM GOAL #4   Title  Pt will perform BADLs and simple selected  home maintenance tasks with distant supervision using schedule/strategies prn.    Time  12    Period  Weeks    Status  New      OT LONG TERM GOAL #5   Title  Pt will perform simple snack prep, use microwave, cold meal prep and make appropriate choices mod I.     Time  12    Period  Weeks    Status  New            Plan - 01/18/19 1300    Clinical Impression Statement  Pt with improving coordination and less cueing to continue with functional tasks today.   Pt's sister reports setting alarm for pt and he got up and got ready for therapy  by himself..    Occupational Profile and client history currently impacting functional performance  Pt was independent and working prior to injury; however, now pt needs set-up/supervision and cueing for ADLs/IADLs.  Pt enjoyed wrestling and soccer prior to injury, but is now unable to engage in these activities.    Rehab Potential  Fair    OT Frequency  2x / week    OT Duration  12 weeks    OT Treatment/Interventions  Self-care/ADL training;Electrical Stimulation;Therapeutic exercise;Visual/perceptual remediation/compensation;Aquatic Therapy;Moist Heat;Paraffin;Neuromuscular education;Patient/family education;Splinting;Fluidtherapy;Energy conservation;FTherapist, nutritionalTherapeutic activities;Cryotherapy;Ultrasound;DME and/or AE instruction;Contrast Bath;Manual Therapy;Passive range of motion;Cognitive remediation/compensation;Balance training    Plan  continue to address cogntion, problem solving, functional IADL tasks, LUE functional use    Consulted and Agree with Plan of Care  Family member/caregiver;Patient;Other (Comment)    Family Member Consulted  sister, interpreter       Patient will benefit from skilled therapeutic intervention in order to improve the following deficits and impairments:     Visit Diagnosis: Attention and concentration deficit  Frontal lobe and executive function deficit  Other lack of coordination  Muscle  weakness (generalized)    Problem List Patient Active Problem List   Diagnosis Date Noted  . Microalbuminuria 12/15/2018  . Hypothyroidism 09/21/2018  . Seizure disorder ([place] 06/16/2018  . Low testosterone  06/16/2018  . Traumatic brain injury with loss of consciousness ([place]) 10/29/2017  . Anemia 10/29/2017  . Positive depression screening 10/29/2017  . Attention and concentration deficit   . Type 2 diabetes mellitus with peripheral neuropathy ([facility]CC)   . S/P craniotomy 03/19/2017  . Essential hypertension   . Dysphagia, oropharyngeal 02/01/2017  . Diabetes type 2, uncontrolled ([place]) 06/13/2015    , 01/18/2019, 1:01 PM  [facility] [address] [place], [place], [address] Phone: [phone]   Fax:  [phone]  Name: [name] MRN: [id number] Date of Birth: 01-10-1966

## 2019-01-23 ENCOUNTER — Telehealth: Payer: Self-pay | Admitting: Occupational Therapy

## 2019-01-23 NOTE — Telephone Encounter (Signed)
Pt's sister (caregiver) was contacted today regarding the temporary closing of [facility] due to Covid-19.  Therapist discussed:  Continued HEP  [facility] will follow up with patients when we are able to resume care.  Willa Frater, OTR/L [facility] [address] [place], [place]  [address] [phone] phone [phone] 01/23/19 12:03 PM    [facility] [address] [place], [place]  [address] Phone:  [phone] Fax:  [phone] \

## 2019-01-24 ENCOUNTER — Ambulatory Visit: Payer: Self-pay | Admitting: Occupational Therapy

## 2019-01-25 ENCOUNTER — Other Ambulatory Visit: Payer: Self-pay

## 2019-01-25 ENCOUNTER — Encounter: Payer: Self-pay | Admitting: Physical Medicine & Rehabilitation

## 2019-01-25 ENCOUNTER — Encounter: Payer: Self-pay | Attending: Physical Medicine & Rehabilitation | Admitting: Physical Medicine & Rehabilitation

## 2019-01-25 VITALS — BP 112/74 | HR 78 | Temp 98.2°F | Ht 62.0 in | Wt 144.0 lb

## 2019-01-25 DIAGNOSIS — F329 Major depressive disorder, single episode, unspecified: Secondary | ICD-10-CM | POA: Insufficient documentation

## 2019-01-25 DIAGNOSIS — E1142 Type 2 diabetes mellitus with diabetic polyneuropathy: Secondary | ICD-10-CM | POA: Insufficient documentation

## 2019-01-25 DIAGNOSIS — S069X9S Unspecified intracranial injury with loss of consciousness of unspecified duration, sequela: Secondary | ICD-10-CM

## 2019-01-25 DIAGNOSIS — S0219XS Other fracture of base of skull, sequela: Secondary | ICD-10-CM | POA: Insufficient documentation

## 2019-01-25 DIAGNOSIS — R269 Unspecified abnormalities of gait and mobility: Secondary | ICD-10-CM | POA: Insufficient documentation

## 2019-01-25 DIAGNOSIS — R569 Unspecified convulsions: Secondary | ICD-10-CM | POA: Insufficient documentation

## 2019-01-25 DIAGNOSIS — Z5181 Encounter for therapeutic drug level monitoring: Secondary | ICD-10-CM

## 2019-01-25 DIAGNOSIS — Z9889 Other specified postprocedural states: Secondary | ICD-10-CM | POA: Insufficient documentation

## 2019-01-25 DIAGNOSIS — S062X3S Diffuse traumatic brain injury with loss of consciousness of 1 hour to 5 hours 59 minutes, sequela: Secondary | ICD-10-CM

## 2019-01-25 DIAGNOSIS — R5383 Other fatigue: Secondary | ICD-10-CM | POA: Insufficient documentation

## 2019-01-25 DIAGNOSIS — R7989 Other specified abnormal findings of blood chemistry: Secondary | ICD-10-CM

## 2019-01-25 DIAGNOSIS — G40909 Epilepsy, unspecified, not intractable, without status epilepticus: Secondary | ICD-10-CM

## 2019-01-25 DIAGNOSIS — I10 Essential (primary) hypertension: Secondary | ICD-10-CM | POA: Insufficient documentation

## 2019-01-25 MED ORDER — METHYLPHENIDATE HCL 5 MG PO TABS: 5.0000 mg | ORAL_TABLET | Freq: Two times a day (BID) | ORAL | 0 refills | Status: DC

## 2019-01-25 MED ORDER — TESTOSTERONE CYPIONATE 200 MG/ML IM SOLN: 200.0000 mg | INTRAMUSCULAR | 5 refills | Status: DC

## 2019-01-25 MED ORDER — LEVETIRACETAM 500 MG PO TABS: 500.0000 mg | ORAL_TABLET | Freq: Two times a day (BID) | ORAL | 2 refills | Status: DC

## 2019-01-25 MED FILL — ATORVASTATIN 10 MG TABLET: 10 | 30 days supply | Qty: 30 | Fill #5

## 2019-01-25 MED FILL — levETIRAcetam 500 MG TABS: 500 | 30 days supply | Qty: 60 | Fill #0
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
## 2019-01-25 NOTE — Patient Instructions (Signed)
PLEASE FEEL FREE TO CALL OUR OFFICE WITH ANY PROBLEMS OR QUESTIONS ([phone])      

## 2019-01-26 ENCOUNTER — Ambulatory Visit: Payer: Self-pay | Admitting: Occupational Therapy

## 2019-01-31 ENCOUNTER — Encounter: Payer: Self-pay | Admitting: Occupational Therapy

## 2019-02-02 ENCOUNTER — Encounter: Payer: Self-pay | Admitting: Occupational Therapy

## 2019-02-21 MED FILL — ATORVASTATIN 10 MG TABLET: 10 | 30 days supply | Qty: 30 | Fill #6

## 2019-02-21 MED FILL — levETIRAcetam 500 MG TABS: 500 | 30 days supply | Qty: 60 | Fill #1

## 2019-02-28 ENCOUNTER — Telehealth (INDEPENDENT_AMBULATORY_CARE_PROVIDER_SITE_OTHER): Payer: Self-pay | Admitting: Neurology

## 2019-02-28 ENCOUNTER — Other Ambulatory Visit: Payer: Self-pay

## 2019-02-28 VITALS — BP 139/77 | HR 69 | Temp 97.0°F | Ht 62.0 in | Wt 141.0 lb

## 2019-02-28 DIAGNOSIS — G40909 Epilepsy, unspecified, not intractable, without status epilepticus: Secondary | ICD-10-CM

## 2019-02-28 DIAGNOSIS — Z87820 Personal history of traumatic brain injury: Secondary | ICD-10-CM

## 2019-02-28 DIAGNOSIS — G40209 Localization-related (focal) (partial) symptomatic epilepsy and epileptic syndromes with complex partial seizures, not intractable, without status epilepticus: Secondary | ICD-10-CM

## 2019-02-28 MED ORDER — LEVETIRACETAM 500 MG PO TABS: 500.0000 mg | ORAL_TABLET | Freq: Two times a day (BID) | ORAL | 3 refills | Status: DC
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
## 2019-03-01 ENCOUNTER — Ambulatory Visit: Payer: Self-pay | Admitting: Neurology

## 2019-03-14 ENCOUNTER — Other Ambulatory Visit: Payer: Self-pay | Admitting: Internal Medicine

## 2019-03-14 DIAGNOSIS — E119 Type 2 diabetes mellitus without complications: Secondary | ICD-10-CM

## 2019-03-14 MED FILL — ?ATORVASTATIN 10 MG TABLET: 10 | 90 days supply | Qty: 90 | Fill #7

## 2019-03-14 MED FILL — levETIRAcetam 500 MG TABS: 500 | 30 days supply | Qty: 60 | Fill #2

## 2019-03-14 MED FILL — ?METFORMIN HCL 1000 MG TAB: 1000 | 30 days supply | Qty: 60 | Fill #0

## 2019-03-29 ENCOUNTER — Encounter: Payer: Self-pay | Attending: Physical Medicine & Rehabilitation | Admitting: Physical Medicine & Rehabilitation

## 2019-03-29 ENCOUNTER — Ambulatory Visit: Payer: Self-pay | Admitting: Physical Medicine & Rehabilitation

## 2019-03-29 ENCOUNTER — Encounter: Payer: Self-pay | Admitting: Physical Medicine & Rehabilitation

## 2019-03-29 ENCOUNTER — Other Ambulatory Visit: Payer: Self-pay

## 2019-03-29 VITALS — BP 120/80 | HR 83 | Temp 98.3°F | Ht 62.0 in

## 2019-03-29 DIAGNOSIS — R269 Unspecified abnormalities of gait and mobility: Secondary | ICD-10-CM | POA: Insufficient documentation

## 2019-03-29 DIAGNOSIS — S069X9S Unspecified intracranial injury with loss of consciousness of unspecified duration, sequela: Secondary | ICD-10-CM

## 2019-03-29 DIAGNOSIS — S0219XS Other fracture of base of skull, sequela: Secondary | ICD-10-CM | POA: Insufficient documentation

## 2019-03-29 DIAGNOSIS — Z9889 Other specified postprocedural states: Secondary | ICD-10-CM | POA: Insufficient documentation

## 2019-03-29 DIAGNOSIS — E1142 Type 2 diabetes mellitus with diabetic polyneuropathy: Secondary | ICD-10-CM | POA: Insufficient documentation

## 2019-03-29 DIAGNOSIS — Z5181 Encounter for therapeutic drug level monitoring: Secondary | ICD-10-CM

## 2019-03-29 DIAGNOSIS — R5383 Other fatigue: Secondary | ICD-10-CM | POA: Insufficient documentation

## 2019-03-29 DIAGNOSIS — F329 Major depressive disorder, single episode, unspecified: Secondary | ICD-10-CM | POA: Insufficient documentation

## 2019-03-29 DIAGNOSIS — R569 Unspecified convulsions: Secondary | ICD-10-CM | POA: Insufficient documentation

## 2019-03-29 DIAGNOSIS — I10 Essential (primary) hypertension: Secondary | ICD-10-CM | POA: Insufficient documentation

## 2019-03-29 DIAGNOSIS — R7989 Other specified abnormal findings of blood chemistry: Secondary | ICD-10-CM

## 2019-03-29 MED ORDER — METHYLPHENIDATE HCL 10 MG PO TABS: 10.0000 mg | ORAL_TABLET | Freq: Two times a day (BID) | ORAL | 0 refills | Status: DC

## 2019-03-29 NOTE — Progress Notes (Signed)
UDS discontinued by provider

## 2019-03-29 NOTE — Progress Notes (Signed)
Subjective:    Patient ID: [name], male    DOB: 07-Feb-1966, 53 y.o.   MRN: [id number]  HPI   The patient is here in follow-up of his traumatic brain injury.  He has been doing fairly well since I last saw him.  Wife states that he has been a bit stir crazy because of staying inside more due to the COVID outbreak.  His wife is a bit hesitant to bring him outside even to walk in the neighborhood due to fears of transmission.  Patient is interacting more with family.  He does have some fatigue during the day but it is better than we last met.  The Ritalin seems to help with his attention and initiation.  Patient would like to resume therapies which she had been involved with prior to things shutting down.  Pain Inventory Average Pain 0 Pain Right Now 0 My pain is na  In the last 24 hours, has pain interfered with the following? General activity 0 Relation with others 0 Enjoyment of life 0 What TIME of day is your pain at its worst? na Sleep (in general) Fair  Pain is worse with: na Pain improves with: na Relief from Meds: na  Mobility walk without assistance ability to climb steps?  yes do you drive?  no  Function not employed: date last employed .  Neuro/Psych confusion depression  Prior Studies Any changes since last visit?  no  Physicians involved in your care Any changes since last visit?  no   Family History  Problem Relation Age of Onset  . Diabetes Mother   . Diabetes Father    Social History   Socioeconomic History  . Marital status: Single    Spouse name: Not on file  . Number of children: 1  . Years of education: 60  . Highest education level: High school graduate  Occupational History  . 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 Financial resource strain: Not on file  . Food insecurity:    Worry: Not on file    Inability: Not on file  . Transportation needs:    Medical: Not on file    Non-medical: Not on file  Tobacco Use  .  Smoking status: Never Smoker  . Smokeless tobacco: Never Used  Substance and Sexual Activity  . Alcohol use: Yes    Comment: socially, not since accident  . Drug use: No  . Sexual activity: Not on file  Lifestyle  . Physical activity:    Days per week: Not on file    Minutes per session: Not on file  . Stress: Not on file  Relationships  . Social connections:    Talks on phone: Not on file    Gets together: Not on file    Attends religious service: Not on file    Active member of club or organization: Not on file    Attends meetings of clubs or organizations: Not on file    Relationship status: Not on file  Other Topics Concern  . Not on file  Social History Narrative   Lives with sister and her family.  Has one daughter in [place].  Does not work.  Education: high school.    Past Surgical History:  Procedure Laterality Date  . CRANIOTOMY Right 01/13/2017   Procedure: 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, Placement of bone flap in abdomen;  Surgeon: Ashok Pall, MD;  Location: [facility];  Service: Neurosurgery;  Laterality: Right;  .  CRANIOTOMY Right 03/19/2017   Procedure: Cranioplasty with placement of bone flap from abdomen;  Surgeon: Eustace Moore, MD;  Location: [facility];  Service: Neurosurgery;  Laterality: Right;   Past Medical History:  Diagnosis Date  . Diabetes mellitus without complication ([place])   . Seizures ([place])   . TBI (traumatic brain injury) ([place]) 03/2017   BP 120/80   Pulse 83   Temp 98.3 F (36.8 C)   Ht '5\' 2"'$  (1.575 m)   SpO2 98%   BMI 25.79 kg/m   Opioid Risk Score:   Fall Risk Score:  `1  Depression screen PHQ 2/9  Depression screen [facility] 2/9 12/13/2018 09/21/2018 07/28/2018 06/16/2018 03/04/2018 10/29/2017 06/29/2017  Decreased Interest 0 3 1 0 2 2 0  Down, Depressed, Hopeless 0 0 0 0 0 0 0  PHQ - 2 Score 0 3 1 0 2 2 0  Altered sleeping - 0 1 - 1 3 0  Tired, decreased energy - 1 2 - 0 0 3  Change in appetite - 0 2 - 2 0 0   Feeling bad or failure about yourself  - 0 0 - 0 0 0  Trouble concentrating - 1 0 - 0 2 0  Moving slowly or fidgety/restless - 1 1 - 0 0 0  Suicidal thoughts - 0 0 - 0 0 0  PHQ-9 Score - 6 7 - '5 7 3  '$ Some recent data might be hidden     Review of Systems  Constitutional: Positive for unexpected weight change.  HENT: Negative.   Eyes: Negative.   Respiratory: Negative.   Cardiovascular: Negative.   Gastrointestinal: Negative.   Endocrine: Negative.   Genitourinary: Negative.   Musculoskeletal: Negative.   Skin: Negative.   Allergic/Immunologic: Negative.   Neurological: Negative.   Hematological: Negative.   Psychiatric/Behavioral: Positive for confusion and dysphoric mood.  All other systems reviewed and are negative.      Objective:   Physical Exam General: No acute distress HEENT: EOMI, oral membranes moist Cards: reg rate  Chest: normal effort Abdomen: Soft, NT, ND Skin: dry, intact Extremities: no edema Musculoskeletal: He exhibits no edema or tenderness.  Neurological: He is alert and more attentive. Motor: 5/5in all fours.   gait still choppy. Some delays in processing.  Skin: Skin is warm and dry.  Psychiatric: Affect is bright.    Assessment & Plan:  1. Decreased functional mobility secondary to right intraparenchymal hemorrhage-SDH-temporal bone fracture after fall. Status post right frontotemporoparietal craniectomy 01/14/2017 with cranioplasty 03/19/2017 -made a referral to neuro-rehab to resume PT and OT   -increase ritalin to '10mg'$  to see if we can further improve attention and initiation        -Provided second Ritalin prescription for next month  We will continue the controlled substance monitoring program, this consists of regular clinic visits, examinations, routine drug screening, pill counts as well as use of [place] Controlled Substance Reporting System. [place] today.   2. Seizure prophylaxis.   keppra 500 mg twice daily. 3. Diabetes mellitus peripheral neuropathy.Per primary. 4. Hypertension. bp controlled off meds 5. Fatigue: -some improvement        -may take a nap during day, discussed effects of BI on energy levels -testosterone  200 mg every 2 weeks.    -follow up levels in September -pt denies depression        -Continue Ritalin as above   15 minutes of face to face patient care time were spent during this  visit. All questions were encouraged and answered.   Follow up in4 months.

## 2019-03-30 ENCOUNTER — Telehealth: Payer: Self-pay | Admitting: *Deleted

## 2019-03-30 MED ORDER — METHYLPHENIDATE HCL 10 MG PO TABS: 10.0000 mg | ORAL_TABLET | Freq: Two times a day (BID) | ORAL | 0 refills | Status: DC

## 2019-03-30 NOTE — Telephone Encounter (Signed)
rx'es changed

## 2019-03-30 NOTE — Telephone Encounter (Deleted)
From MyChart email to  Dr. Riley Kill : This is Eriel's sister , could you please sent the refills to [facility]. and [facility] . Testosterone andMethylphenidate. Thank you. A Sheran Spine

## 2019-03-30 NOTE — Telephone Encounter (Signed)
error 

## 2019-04-06 ENCOUNTER — Encounter: Payer: Self-pay | Admitting: Occupational Therapy

## 2019-04-06 ENCOUNTER — Ambulatory Visit: Payer: Self-pay | Attending: Physical Medicine & Rehabilitation | Admitting: Occupational Therapy

## 2019-04-06 ENCOUNTER — Other Ambulatory Visit: Payer: Self-pay

## 2019-04-06 DIAGNOSIS — R2681 Unsteadiness on feet: Secondary | ICD-10-CM | POA: Insufficient documentation

## 2019-04-06 DIAGNOSIS — R41840 Attention and concentration deficit: Secondary | ICD-10-CM | POA: Insufficient documentation

## 2019-04-06 DIAGNOSIS — R41844 Frontal lobe and executive function deficit: Secondary | ICD-10-CM | POA: Insufficient documentation

## 2019-04-06 DIAGNOSIS — R278 Other lack of coordination: Secondary | ICD-10-CM | POA: Insufficient documentation

## 2019-04-06 DIAGNOSIS — M6281 Muscle weakness (generalized): Secondary | ICD-10-CM | POA: Insufficient documentation

## 2019-04-06 DIAGNOSIS — R2689 Other abnormalities of gait and mobility: Secondary | ICD-10-CM | POA: Insufficient documentation

## 2019-04-06 NOTE — Therapy (Signed)
[facility] [address] [place], [place], [address] Phone: [phone]   Fax:  [phone]  Occupational Therapy Treatment  Patient Details  Name: [name] MRN: [id number] Date of Birth: 06/26/1966 Referring Provider (OT): Dr. Alger Simons   Encounter Date: 04/06/2019  OT End of Session - 04/06/19 1355    Visit Number  16    Number of Visits  24   15+9   Date for OT Re-Evaluation  05/06/19    Authorization Type  [facility] [employer] covered 100% through 07/05/19    OT Start Time  3570    OT Stop Time  1148    OT Time Calculation (min)  43 min    Activity Tolerance  Patient tolerated treatment well    Behavior During Therapy  [facility] for tasks assessed/performed;Flat affect       Past Medical History:  Diagnosis Date  . Diabetes mellitus without complication ([place])   . Seizures ([place])   . TBI (traumatic brain injury) ([place]) 03/2017    Past Surgical History:  Procedure Laterality Date  . CRANIOTOMY Right 01/13/2017   Procedure: 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, Placement of bone flap in abdomen;  Surgeon: Ashok Pall, MD;  Location: [facility];  Service: Neurosurgery;  Laterality: Right;  . CRANIOTOMY Right 03/19/2017   Procedure: Cranioplasty with placement of bone flap from abdomen;  Surgeon: Eustace Moore, MD;  Location: [facility];  Service: Neurosurgery;  Laterality: Right;    There were no vitals filed for this visit.  Subjective Assessment - 04/06/19 1110    Subjective   Sister reports that he is able to cook scrambled eggs independently now and has more responsibility at home, but continues to need cueing at times.    Patient is accompanied by:  Family member;Interpreter   interpreter via video   Pertinent History  DM, HTN, seizure disorder, hypothyroidism    Patient Stated Goals  "to be well"    Currently in Pain?  No/denies         [facility] OT Assessment - 04/06/19 0001       Prior Function   Leisure  hasn't be able to go to gym due to Covid-19 closures, but is walking in parking lot independently      ADL   ADL comments  Sister reports that pt is doing some puzzles, but not performing other LUE HEP.  She also reports that pt has morning routine where he wakes up (inconsistently with alarm), pulls back curtains, showers, makes breakfast, and takes out the trash      IADL   Prior Level of Function Light Housekeeping  now washes dishes inconsistently with sister checking after him, folds towels, takes out trash    Prior Level of Function Meal Prep  now able to scramble eggs/make breakfast independently, cold meal prep      Coordination   Right 9 Hole Peg Test  42.53    Left 9 Hole Peg Test  41.41    Other  Pt demo decr control with  LUE functional reaching at times with possible dymetria, but inconsistent      Hand Function   Right Hand Grip (lbs)  46    Left Hand Grip (lbs)  35       CLINIC OPERATION CHANGES: Outpatient Neuro Rehab is open at lower capacity following universal masking, social distancing, and patient screening.  The patient's COVID risk of complications score is 3.  Reassessed/checked goals and discussed progress (see above and goal section below) due to interruption in OT due to Covid-19.  Discussed progress with schedule, ADLs/IADLs.  Cognitive/visual scanning task:  With cards spread out of the table, Pt found all 4 cards of a number in order.  Pt needed min-mod cueing for scanning and to track how many cards of each number he needed as well as prompts for initiation.  Pt needed incr time for task, demo confusion at times, and intermittent mod difficulty with LUE control to pick up cards (after prompting to use LUE) which may be due to dysmetria.  Pt also noted to close R eye 1x when reaching to L side, but he did not repeat this in similar circumstances.       OT Education - 04/06/19 1354    Education Details  updates to  POC/reassessment results (due to interruption in care due to Covid-19 closures); Recommendation for transition to Vocational Rehab once he completes therapy (and services Voc Rehab can provide)    Person(s) Educated  Patient;Caregiver(s)    Methods  Explanation    Comprehension  Verbalized understanding       OT Short Term Goals - 04/06/19 1401      OT SHORT TERM GOAL #1   Title  Pt will be independent with initial HEP.--check STGs 12/27/18    Time  6    Period  Weeks    Status  Partially Met   12/29/18:  pt/family, pt needs supervision/cueing from family (but sister able to provide cueing/supervision)     OT SHORT TERM GOAL #2   Title  Pt will improve L grip strength by at least 5lbs to assit with IADLs.      Baseline  20lbs    Time  6    Period  Weeks    Status  Achieved   12/27/18  46lbs     OT SHORT TERM GOAL #3   Title  Pt will be improve LUE control/strength to able to performing targeted overhead reaching with LUE x 30mn without drops.    Time  6    Period  Weeks    Status  Achieved   12/29/18     OT SHORT TERM GOAL #4   Title  Pt/family will verbalize understanding of cognitive compensation strategies for ADLs/IADLs.    Time  6    Period  Weeks    Status  Achieved   12/29/18 with pt/family     OT SHORT TERM GOAL #5   Title  Pt will attend to functional cognitive tasks for at least 10 min with only min cueing for problem solving.    Time  6    Period  Weeks    Status  Deferred   12/27/18:  2-55m, mod cueing (dependent on task).  04/06/19:  defer to LTG       OT Long Term Goals - 04/06/19 1401      OT LONG TERM GOAL #1   Title  Pt will be independent with updated HEP.--check LTGs 05/06/19    Time  4    Period  Weeks    Status  New      OT LONG TERM GOAL #2   Title  Pt will demo at least 40lbs L grip strength to assist with IADLs.    Baseline  04/06/19:  35lbs     Time  4    Period  Weeks    Status  Revised  OT LONG TERM GOAL #3   Title  Pt will be able  to retrieve/replace 3lb object on overhead shelf with LUE x3 with good safety/control.    Time  4    Period  Weeks    Status  On-going      OT LONG TERM GOAL #4   Title  Pt will perform BADLs and simple selected home maintenance tasks with distant supervision using schedule/strategies prn.    Time  12    Period  Weeks    Status  Partially Met   04/06/19 needs min v.c. inconsistently     OT LONG TERM GOAL #5   Title  Pt will perform simple snack prep, use microwave, cold meal prep and make appropriate choices mod I.     Time  12    Period  Weeks    Status  Achieved   04/06/19:  can make sandwiches, use microwave, and scramble eggs     OT LONG TERM GOAL #6   Title  Pt will attend to functional cognitive tasks for at least 10 min with only min cueing for problem solving.    Time  4    Period  Weeks    Status  New      OT LONG TERM GOAL #7   Title  Pt will be able to generate simple to do list/schedule with min prompts    Time  4    Period  Weeks    Status  New            Plan - 04/06/19 1357    Clinical Impression Statement  Pt returns to OT for reassessment after interruption in POC due to Covid-19 closures.  Pt/sister report no medical changes, but feel like physically that he is not doing as well due to staying at home and gym closures.  Pt is performing more IADLs, but continues to need some prompts to do so.  Pt/sister would like pt to resume OT to continue to work on LUE, ADLs, and cogniton.  Pt would benefit from further occupational therapy to maximize LUE functional use and participation in ADLs/IADLs for incr safety, independence, and quality of life.    Occupational Profile and client history currently impacting functional performance  Pt was independent and working prior to injury; however, now pt needs set-up/supervision and cueing for ADLs/IADLs.  Pt enjoyed wrestling and soccer prior to injury, but is now unable to engage in these activities.    Rehab Potential  Fair     OT Frequency  2x / week    OT Duration  --   5 weeks, or 9 visits    OT Treatment/Interventions  Self-care/ADL training;Electrical Stimulation;Therapeutic exercise;Visual/perceptual remediation/compensation;Aquatic Therapy;Moist Heat;Paraffin;Neuromuscular education;Patient/family education;Splinting;Fluidtherapy;Energy conservation;Therapist, nutritional;Therapeutic activities;Cryotherapy;Ultrasound;DME and/or AE instruction;Contrast Bath;Manual Therapy;Passive range of motion;Cognitive remediation/compensation;Balance training    Plan  continue to address cogntion, problem solving, functional IADL tasks, LUE functional use    Consulted and Agree with Plan of Care  Family member/caregiver;Patient;Other (Comment)    Family Member Consulted  sister, interpreter       Patient will benefit from skilled therapeutic intervention in order to improve the following deficits and impairments:     Visit Diagnosis: Attention and concentration deficit  Frontal lobe and executive function deficit  Other lack of coordination  Muscle weakness (generalized)  Unsteadiness on feet  Other abnormalities of gait and mobility    Problem List Patient Active Problem List   Diagnosis Date Noted  . Microalbuminuria 12/15/2018  .  Hypothyroidism 09/21/2018  . Seizure disorder ([place]) 06/16/2018  . Low testosterone 06/16/2018  . Traumatic brain injury with loss of consciousness ([place]) 10/29/2017  . Anemia 10/29/2017  . Positive depression screening 10/29/2017  . Attention and concentration deficit   . Type 2 diabetes mellitus with peripheral neuropathy ([facility])   . S/P craniotomy 03/19/2017  . Essential hypertension   . Dysphagia, oropharyngeal 02/01/2017  . Diabetes type 2, uncontrolled ([place]) 06/13/2015    [facility] 04/06/2019, 2:19 PM  [facility] [address] [place], [place], [address] Phone: [phone]   Fax:   [phone]  Name: [name] MRN: [id number] Date of Birth: 08-Oct-1966   Vianne Bulls, OTR/L [facility] [address] [place], [place]  [address] [phone] phone [phone] 04/06/19 2:19 PM

## 2019-04-10 ENCOUNTER — Ambulatory Visit: Payer: Self-pay | Admitting: Internal Medicine

## 2019-04-10 MED FILL — levETIRAcetam 500 MG TABS: 500 | 30 days supply | Qty: 60 | Fill #0

## 2019-04-10 MED FILL — metFORMIN HCL 1000 MG TABS: 1000 | 30 days supply | Qty: 60 | Fill #1

## 2019-04-11 ENCOUNTER — Other Ambulatory Visit: Payer: Self-pay

## 2019-04-11 ENCOUNTER — Encounter: Payer: Self-pay | Admitting: Internal Medicine

## 2019-04-11 ENCOUNTER — Ambulatory Visit: Payer: Self-pay | Attending: Internal Medicine | Admitting: Internal Medicine

## 2019-04-11 DIAGNOSIS — G40909 Epilepsy, unspecified, not intractable, without status epilepticus: Secondary | ICD-10-CM

## 2019-04-11 DIAGNOSIS — R809 Proteinuria, unspecified: Secondary | ICD-10-CM

## 2019-04-11 DIAGNOSIS — R7989 Other specified abnormal findings of blood chemistry: Secondary | ICD-10-CM

## 2019-04-11 DIAGNOSIS — Z87820 Personal history of traumatic brain injury: Secondary | ICD-10-CM

## 2019-04-11 DIAGNOSIS — E1129 Type 2 diabetes mellitus with other diabetic kidney complication: Secondary | ICD-10-CM | POA: Insufficient documentation

## 2019-04-11 DIAGNOSIS — E119 Type 2 diabetes mellitus without complications: Secondary | ICD-10-CM

## 2019-04-11 MED ORDER — LISINOPRIL 2.5 MG PO TABS: 2.5000 mg | ORAL_TABLET | Freq: Every day | ORAL | 6 refills | Status: DC

## 2019-04-11 MED FILL — LISINOPRIL 2.5 MG TABLET: 2.5 | 30 days supply | Qty: 30 | Fill #0

## 2019-04-11 NOTE — Progress Notes (Signed)
Pt sister states pt sugar was 150 this morning   Pt sister states it is hard to cut the lisinopril in half

## 2019-04-11 NOTE — Progress Notes (Signed)
Virtual Visit via Telephone Note Due to current restrictions/limitations of in-office visits due to the COVID-19 pandemic, this scheduled clinical appointment was converted to a telehealth visit  I connected with [name] on 04/11/19 at 3:40 p.m EDT by telephone and verified that I am speaking with the correct person using two identifiers. I am in my office.  The patient is at home.  The patient, sister [name] and myself participated in this encounter.  His sister interprets and provides most of the hx.  I discussed the limitations, risks, security and privacy concerns of performing an evaluation and management service by telephone and the availability of in person appointments. I also discussed with the patient that there may be a patient responsible charge related to this service. The patient expressed understanding and agreed to proceed.   History of Present Illness: Pt with history of diabetes type 2,HTN, HL, low testosterone, grand-mal szand craniotomy due tointracranial bleed, cranial fractures and subdural hematomas2018.pt last seen 12/2018.    Sister reports that pt was doing better with P.T/O.T but it all stopped during COVID-19.  Just started back with therapy last wk. -energy level and attention a little better with Ritalin.  Dose increased to 10 mg BID.  Makes his own breakfast and does light house hold chores with supervision from his sister.  He continues to follow with Dr. Peri Jefferson Testosterone:  Still getting 200 mg shots every 2 wks.    DM:  Sister checks his BS once a wk.  Highest was 170.  BS today was 150.  Sister states he loves snacks but she monitors him and allows him a treat only once a day Compliant with Metformin Latest BP reading with PMR was 120/80.  On lisinopril for microalbuminurea.  His blood pressure has always been controlled off of medication Sister limits salt in his foods Pt's sister states Lisinopril 5 mg tab is too small to cut in  half to get to 2.5 mg.  Outpatient Encounter Medications as of 04/11/2019  Medication Sig  . atorvastatin (LIPITOR) 10 MG tablet Take 1 tablet (10 mg total) by mouth daily.  . Blood Glucose Monitoring Suppl (TRUE METRIX METER) w/Device KIT Use as directed  . cetirizine (ZYRTEC) 10 MG tablet Take 1 tablet (10 mg total) by mouth daily.  Marland Kitchen glucose blood test strip Use as instructed  . levETIRAcetam (KEPPRA) 500 MG tablet Take 1 tablet (500 mg total) by mouth 2 (two) times daily.  Marland Kitchen lisinopril (PRINIVIL,ZESTRIL) 5 MG tablet Take 0.5 tablets (2.5 mg total) by mouth daily.  . metFORMIN (GLUCOPHAGE) 1000 MG tablet TAKE 1 TABLET BY MOUTH 2 TIMES DAILY WITH A MEAL.  . methylphenidate (RITALIN) 10 MG tablet Take 1 tablet (10 mg total) by mouth 2 (two) times daily with breakfast and lunch. At 0700 and 1200 daily  . testosterone cypionate (DEPOTESTOSTERONE CYPIONATE) 200 MG/ML injection Inject 1 mL (200 mg total) into the muscle every 14 (fourteen) days.  . TRUEPLUS LANCETS 28G MISC Use as directed   No facility-administered encounter medications on file as of 04/11/2019.       Observations/Objective: No direct observation done as this was a telephone encounter.  Assessment and Plan: 1. Controlled type 2 diabetes mellitus without complication, without long-term current use of insulin (HCC) Sister not checking blood sugars enough to get an accurate idea of his level of control.  Advised to check blood sugars at least every other day.  She will bring the patient to the lab to  have his blood test done - CBC; Future - Lipid panel; Future - Comprehensive metabolic panel; Future - Hemoglobin A1c; Future  2. Microalbuminuria due to type 2 diabetes mellitus ([place]) I have changed the lisinopril to the 2.5 mg tablets - lisinopril (ZESTRIL) 2.5 MG tablet; Take 1 tablet (2.5 mg total) by mouth daily.  Dispense: 30 tablet; Refill: 6  3. Low testosterone On testosterone injections - PSA; Future  4. Seizure  disorder ([place]) On Keppra.  No seizures since last visit  5. History of traumatic brain injury Patient continues to receive some PT and OT   Follow Up Instructions: F/u in 3 mths   I discussed the assessment and treatment plan with the patient. The patient was provided an opportunity to ask questions and all were answered. The patient agreed with the plan and demonstrated an understanding of the instructions.   The patient was advised to call back or seek an in-person evaluation if the symptoms worsen or if the condition fails to improve as anticipated.  I provided 12 minutes of non-face-to-face time during this encounter.   Karle Plumber, MD

## 2019-04-12 ENCOUNTER — Ambulatory Visit: Payer: Self-pay | Admitting: Occupational Therapy

## 2019-04-13 ENCOUNTER — Ambulatory Visit: Payer: Self-pay | Admitting: Occupational Therapy

## 2019-04-13 ENCOUNTER — Encounter: Payer: Self-pay | Admitting: Occupational Therapy

## 2019-04-13 ENCOUNTER — Other Ambulatory Visit: Payer: Self-pay

## 2019-04-13 DIAGNOSIS — R2689 Other abnormalities of gait and mobility: Secondary | ICD-10-CM

## 2019-04-13 DIAGNOSIS — R41840 Attention and concentration deficit: Secondary | ICD-10-CM

## 2019-04-13 DIAGNOSIS — M6281 Muscle weakness (generalized): Secondary | ICD-10-CM

## 2019-04-13 DIAGNOSIS — R278 Other lack of coordination: Secondary | ICD-10-CM

## 2019-04-13 DIAGNOSIS — R41844 Frontal lobe and executive function deficit: Secondary | ICD-10-CM

## 2019-04-13 DIAGNOSIS — R2681 Unsteadiness on feet: Secondary | ICD-10-CM

## 2019-04-13 NOTE — Therapy (Signed)
[facility] [address] [place], [place], [address] Phone: [phone]   Fax:  [phone]  Occupational Therapy Treatment  Patient Details  Name: [name] MRN: [id number] Date of Birth: 07-18-1966 Referring Provider (OT): Dr. Alger Simons   Encounter Date: 04/13/2019  OT End of Session - 04/13/19 1220    Visit Number  17    Number of Visits  24   15+9   Date for OT Re-Evaluation  05/06/19    Authorization Type  [facility] covered 100% through 07/05/19    OT Start Time  1829    OT Stop Time  1248    OT Time Calculation (min)  43 min    Activity Tolerance  Patient tolerated treatment well    Behavior During Therapy  [facility] for tasks assessed/performed;Flat affect       Past Medical History:  Diagnosis Date  . Diabetes mellitus without complication ([place])   . Seizures ([place])   . TBI (traumatic brain injury) ([place]) 03/2017    Past Surgical History:  Procedure Laterality Date  . CRANIOTOMY Right 01/13/2017   Procedure: 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, Placement of bone flap in abdomen;  Surgeon: Ashok Pall, MD;  Location: [facility];  Service: Neurosurgery;  Laterality: Right;  . CRANIOTOMY Right 03/19/2017   Procedure: Cranioplasty with placement of bone flap from abdomen;  Surgeon: Eustace Moore, MD;  Location: [facility];  Service: Neurosurgery;  Laterality: Right;    There were no vitals filed for this visit.  Subjective Assessment - 04/13/19 1219    Subjective   Sister/pt reports that pt has been vacuuming and sweeping at home (but goes too fast at times)    Patient is accompanied by:  Family member;Interpreter   interpreter via video   Pertinent History  DM, HTN, seizure disorder, hypothyroidism    Patient Stated Goals  "to be well"    Currently in Pain?  No/denies         CLINIC OPERATION CHANGES: Outpatient Neuro Rehab is open at lower capacity following  universal masking, social distancing, and patient screening.  The patient's COVID risk of complications score is 3.  In supine, AAROM shoulder flex and chest press with BUEs with ball with min cueing to slow down and keep elbows straight.  In quadruped, forward/back wt. Shifts, alternating UE lifts for incr shoulder/core stability and neuro re-ed with min-mod cueing to slow down and for technique.  In sitting, functional reaching with LUE to place large pegs in vertical pegboard to copy design with min-mod cues due to larger board/problem solving for design.  Pt demo good accuracy with LUE functional reaching and was able to copy design once cued regarding size difference with min cues.    Pt generated list and wrote down his morning responsibilities (sister reports that he needs cueing for initiation and forgetting 1-2 jobs).  Pt needed min cueing for 1 omission/incorrect item on list.  Instructed pt (and sister) that pt should use list to check off responsibilities (made copies of list) over next week and progress to pt writing down "to do" list the night before for repetition and incr initiation.  Also discussed this difficulty is as result of brain injury and that pt will need to learn to compensate for it by using visual and verbal cues (preferrably visual for incr independence) and repetition.  Pt/sister verbalized understanding.  Also encouraged pt to answer questions himself and assisted pt in reflecting  on difficulties to incr awareness (through interpreter).        OT Short Term Goals - 04/06/19 1401      OT SHORT TERM GOAL #1   Title  Pt will be independent with initial HEP.--check STGs 12/27/18    Time  6    Period  Weeks    Status  Partially Met   12/29/18:  pt/family, pt needs supervision/cueing from family (but sister able to provide cueing/supervision)     OT SHORT TERM GOAL #2   Title  Pt will improve L grip strength by at least 5lbs to assit with IADLs.      Baseline  20lbs     Time  6    Period  Weeks    Status  Achieved   12/27/18  46lbs     OT SHORT TERM GOAL #3   Title  Pt will be improve LUE control/strength to able to performing targeted overhead reaching with LUE x 43mn without drops.    Time  6    Period  Weeks    Status  Achieved   12/29/18     OT SHORT TERM GOAL #4   Title  Pt/family will verbalize understanding of cognitive compensation strategies for ADLs/IADLs.    Time  6    Period  Weeks    Status  Achieved   12/29/18 with pt/family     OT SHORT TERM GOAL #5   Title  Pt will attend to functional cognitive tasks for at least 10 min with only min cueing for problem solving.    Time  6    Period  Weeks    Status  Deferred   12/27/18:  2-573m, mod cueing (dependent on task).  04/06/19:  defer to LTG       OT Long Term Goals - 04/06/19 1401      OT LONG TERM GOAL #1   Title  Pt will be independent with updated HEP.--check LTGs 05/06/19    Time  4    Period  Weeks    Status  New      OT LONG TERM GOAL #2   Title  Pt will demo at least 40lbs L grip strength to assist with IADLs.    Baseline  04/06/19:  35lbs     Time  4    Period  Weeks    Status  Revised      OT LONG TERM GOAL #3   Title  Pt will be able to retrieve/replace 3lb object on overhead shelf with LUE x3 with good safety/control.    Time  4    Period  Weeks    Status  On-going      OT LONG TERM GOAL #4   Title  Pt will perform BADLs and simple selected home maintenance tasks with distant supervision using schedule/strategies prn.    Time  12    Period  Weeks    Status  Partially Met   04/06/19 needs min v.c. inconsistently     OT LONG TERM GOAL #5   Title  Pt will perform simple snack prep, use microwave, cold meal prep and make appropriate choices mod I.     Time  12    Period  Weeks    Status  Achieved   04/06/19:  can make sandwiches, use microwave, and scramble eggs     OT LONG TERM GOAL #6   Title  Pt will attend to functional cognitive tasks for at least  10 min  with only min cueing for problem solving.    Time  4    Period  Weeks    Status  New      OT LONG TERM GOAL #7   Title  Pt will be able to generate simple to do list/schedule with min prompts    Time  4    Period  Weeks    Status  New            Plan - 04/13/19 1221    Clinical Impression Statement  Pt demo improved attention today while copying peg design with no redirection or cueing needed for problem solving.    Occupational Profile and client history currently impacting functional performance  Pt was independent and working prior to injury; however, now pt needs set-up/supervision and cueing for ADLs/IADLs.  Pt enjoyed wrestling and soccer prior to injury, but is now unable to engage in these activities.    Rehab Potential  Fair    OT Frequency  2x / week    OT Duration  --   5 weeks, or 9 visits    OT Treatment/Interventions  Self-care/ADL training;Electrical Stimulation;Therapeutic exercise;Visual/perceptual remediation/compensation;Aquatic Therapy;Moist Heat;Paraffin;Neuromuscular education;Patient/family education;Splinting;Fluidtherapy;Energy conservation;Therapist, nutritional;Therapeutic activities;Cryotherapy;Ultrasound;DME and/or AE instruction;Contrast Bath;Manual Therapy;Passive range of motion;Cognitive remediation/compensation;Balance training    Plan  continue to address cogntion, problem solving, functional IADL tasks, LUE functional use    Consulted and Agree with Plan of Care  Family member/caregiver;Patient;Other (Comment)    Family Member Consulted  sister, interpreter       Patient will benefit from skilled therapeutic intervention in order to improve the following deficits and impairments:     Visit Diagnosis: Attention and concentration deficit  Frontal lobe and executive function deficit  Other lack of coordination  Muscle weakness (generalized)  Unsteadiness on feet  Other abnormalities of gait and mobility    Problem List Patient  Active Problem List   Diagnosis Date Noted  . Microalbuminuria due to type 2 diabetes mellitus ([place]) 04/11/2019  . Hypothyroidism 09/21/2018  . Seizure disorder ([place]) 06/16/2018  . Low testosterone 06/16/2018  . Traumatic brain injury with loss of consciousness ([place]) 10/29/2017  . Anemia 10/29/2017  . Positive depression screening 10/29/2017  . Attention and concentration deficit   . Type 2 diabetes mellitus with peripheral neuropathy (HCC)   . S/P craniotomy 03/19/2017  . Essential hypertension   . Dysphagia, oropharyngeal 02/01/2017  . Diabetes type 2, uncontrolled ([place]) 06/13/2015    [facility] 04/13/2019, 12:23 PM  [facility] [address] [place], [place], [address] Phone: [phone]   Fax:  [phone]  Name: Dontai Pember MRN: [id number] Date of Birth: 16-May-1966   Vianne Bulls, OTR/L [facility] [address] [place], [place]  [address] [phone] phone [phone] 04/13/19 12:23 PM

## 2019-04-17 ENCOUNTER — Ambulatory Visit: Payer: Self-pay | Admitting: Occupational Therapy

## 2019-04-17 ENCOUNTER — Encounter: Payer: Self-pay | Admitting: Occupational Therapy

## 2019-04-17 ENCOUNTER — Other Ambulatory Visit: Payer: Self-pay

## 2019-04-17 DIAGNOSIS — R41840 Attention and concentration deficit: Secondary | ICD-10-CM

## 2019-04-17 DIAGNOSIS — R2689 Other abnormalities of gait and mobility: Secondary | ICD-10-CM

## 2019-04-17 DIAGNOSIS — M6281 Muscle weakness (generalized): Secondary | ICD-10-CM

## 2019-04-17 DIAGNOSIS — R278 Other lack of coordination: Secondary | ICD-10-CM

## 2019-04-17 DIAGNOSIS — R2681 Unsteadiness on feet: Secondary | ICD-10-CM

## 2019-04-17 NOTE — Therapy (Signed)
[facility] [address] [place], [place], [address] Phone: [phone]   Fax:  [phone]  Occupational Therapy Treatment  Patient Details  Name: [name] MRN: [id number] Date of Birth: December 27, 1965 Referring Provider (OT): Dr. Alger Simons   Encounter Date: 04/17/2019  OT End of Session - 04/17/19 1321    Visit Number  18    Number of Visits  24   15+9   Date for OT Re-Evaluation  05/06/19    Authorization Type  [facility] [employer] covered 100% through 07/05/19    OT Start Time  1300    OT Stop Time  1345    OT Time Calculation (min)  45 min    Activity Tolerance  Patient tolerated treatment well    Behavior During Therapy  [facility] for tasks assessed/performed;Flat affect       Past Medical History:  Diagnosis Date  . Diabetes mellitus without complication ([place])   . Seizures ([place])   . TBI (traumatic brain injury) ([place]) 03/2017    Past Surgical History:  Procedure Laterality Date  . CRANIOTOMY Right 01/13/2017   Procedure: 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, Placement of bone flap in abdomen;  Surgeon: Ashok Pall, MD;  Location: [facility];  Service: Neurosurgery;  Laterality: Right;  . CRANIOTOMY Right 03/19/2017   Procedure: Cranioplasty with placement of bone flap from abdomen;  Surgeon: Eustace Moore, MD;  Location: [facility];  Service: Neurosurgery;  Laterality: Right;    There were no vitals filed for this visit.  Subjective Assessment - 04/17/19 1315    Subjective   Sister reports that she has been trying to explain medications to pt (but sister supervises)    Patient is accompanied by:  Family member;Interpreter   interpreter via video   Pertinent History  DM, HTN, seizure disorder, hypothyroidism    Patient Stated Goals  "to be well"       CLINIC OPERATION CHANGES: Outpatient Neuro Rehab is open at lower capacity following universal masking, social distancing,  and patient screening.  The patient's COVID risk of complications score is 3.  In supine, AAROM shoulder flex and chest press with BUEs with cane with 2lb wt wrapped in middle with min cueing initially keep elbows straight.  In quadruped, forward/back wt. Shifts, alternating UE lifts for incr shoulder/core stability and neuro re-ed with min cueing for technique.   Pt able to write down list of activities that he did this morning with min-mod cueing, then generating written list of things he could get himself if he was hungry (min prompts), and generating list (verbal) of tasks he could do to help around the house for incr initiation and problem solving.  Then discussed ways to incr participation including talking with friends/family on the phone, playing card/board games with 33 y.o. that sister babysits, simple ways to assist with dinner to incr participation, problem solving, initiation, and coordination.  Pt/sister verbalized understanding.         OT Short Term Goals - 04/06/19 1401      OT SHORT TERM GOAL #1   Title  Pt will be independent with initial HEP.--check STGs 12/27/18    Time  6    Period  Weeks    Status  Partially Met   12/29/18:  pt/family, pt needs supervision/cueing from family (but sister able to provide cueing/supervision)     OT SHORT TERM GOAL #2   Title  Pt will improve L grip strength by  at least 5lbs to assit with IADLs.      Baseline  20lbs    Time  6    Period  Weeks    Status  Achieved   12/27/18  46lbs     OT SHORT TERM GOAL #3   Title  Pt will be improve LUE control/strength to able to performing targeted overhead reaching with LUE x 20mn without drops.    Time  6    Period  Weeks    Status  Achieved   12/29/18     OT SHORT TERM GOAL #4   Title  Pt/family will verbalize understanding of cognitive compensation strategies for ADLs/IADLs.    Time  6    Period  Weeks    Status  Achieved   12/29/18 with pt/family     OT SHORT TERM GOAL #5   Title   Pt will attend to functional cognitive tasks for at least 10 min with only min cueing for problem solving.    Time  6    Period  Weeks    Status  Deferred   12/27/18:  2-568m, mod cueing (dependent on task).  04/06/19:  defer to LTG       OT Long Term Goals - 04/06/19 1401      OT LONG TERM GOAL #1   Title  Pt will be independent with updated HEP.--check LTGs 05/06/19    Time  4    Period  Weeks    Status  New      OT LONG TERM GOAL #2   Title  Pt will demo at least 40lbs L grip strength to assist with IADLs.    Baseline  04/06/19:  35lbs     Time  4    Period  Weeks    Status  Revised      OT LONG TERM GOAL #3   Title  Pt will be able to retrieve/replace 3lb object on overhead shelf with LUE x3 with good safety/control.    Time  4    Period  Weeks    Status  On-going      OT LONG TERM GOAL #4   Title  Pt will perform BADLs and simple selected home maintenance tasks with distant supervision using schedule/strategies prn.    Time  12    Period  Weeks    Status  Partially Met   04/06/19 needs min v.c. inconsistently     OT LONG TERM GOAL #5   Title  Pt will perform simple snack prep, use microwave, cold meal prep and make appropriate choices mod I.     Time  12    Period  Weeks    Status  Achieved   04/06/19:  can make sandwiches, use microwave, and scramble eggs     OT LONG TERM GOAL #6   Title  Pt will attend to functional cognitive tasks for at least 10 min with only min cueing for problem solving.    Time  4    Period  Weeks    Status  New      OT LONG TERM GOAL #7   Title  Pt will be able to generate simple to do list/schedule with min prompts    Time  4    Period  Weeks    Status  New            Plan - 04/17/19 1323    Clinical Impression Statement  Pt is progressing toward goals.  Occupational Profile and client history currently impacting functional performance  Pt was independent and working prior to injury; however, now pt needs set-up/supervision and  cueing for ADLs/IADLs.  Pt enjoyed wrestling and soccer prior to injury, but is now unable to engage in these activities.    Rehab Potential  Fair    OT Frequency  2x / week    OT Duration  --   5 weeks, or 9 visits    OT Treatment/Interventions  Self-care/ADL training;Electrical Stimulation;Therapeutic exercise;Visual/perceptual remediation/compensation;Aquatic Therapy;Moist Heat;Paraffin;Neuromuscular education;Patient/family education;Splinting;Fluidtherapy;Energy conservation;Therapist, nutritional;Therapeutic activities;Cryotherapy;Ultrasound;DME and/or AE instruction;Contrast Bath;Manual Therapy;Passive range of motion;Cognitive remediation/compensation;Balance training    Plan  continue to address cogntion, problem solving, functional IADL tasks, LUE functional use    Consulted and Agree with Plan of Care  Family member/caregiver;Patient;Other (Comment)    Family Member Consulted  sister, interpreter       Patient will benefit from skilled therapeutic intervention in order to improve the following deficits and impairments:     Visit Diagnosis: Other lack of coordination  Attention and concentration deficit  Muscle weakness (generalized)  Unsteadiness on feet  Other abnormalities of gait and mobility    Problem List Patient Active Problem List   Diagnosis Date Noted  . Microalbuminuria due to type 2 diabetes mellitus ([place]) 04/11/2019  . Hypothyroidism 09/21/2018  . Seizure disorder ([place]) 06/16/2018  . Low testosterone 06/16/2018  . Traumatic brain injury with loss of consciousness ([place]) 10/29/2017  . Anemia 10/29/2017  . Positive depression screening 10/29/2017  . Attention and concentration deficit   . Type 2 diabetes mellitus with peripheral neuropathy (HCC)   . S/P craniotomy 03/19/2017  . Essential hypertension   . Dysphagia, oropharyngeal 02/01/2017  . Diabetes type 2, uncontrolled ([place]) 06/13/2015    [facility] 04/17/2019, 1:29 PM  [facility] [address] [place], [place], [address] Phone: [phone]   Fax:  [phone]  Name: [name] MRN: [id number] Date of Birth: [birth year]   Vianne Bulls, OTR/L [facility] [address] [place], [place]  [address] [phone] phone [phone] 04/17/19 1:29 PM

## 2019-04-18 ENCOUNTER — Ambulatory Visit: Payer: Self-pay | Admitting: Physical Therapy

## 2019-04-18 ENCOUNTER — Encounter: Payer: Self-pay | Admitting: Physical Therapy

## 2019-04-18 ENCOUNTER — Ambulatory Visit: Payer: Self-pay | Admitting: Occupational Therapy

## 2019-04-18 DIAGNOSIS — M6281 Muscle weakness (generalized): Secondary | ICD-10-CM

## 2019-04-18 DIAGNOSIS — R278 Other lack of coordination: Secondary | ICD-10-CM

## 2019-04-18 DIAGNOSIS — R2681 Unsteadiness on feet: Secondary | ICD-10-CM

## 2019-04-18 DIAGNOSIS — R2689 Other abnormalities of gait and mobility: Secondary | ICD-10-CM

## 2019-04-18 NOTE — Patient Instructions (Signed)
SINGLE LIMB STANCE    Stance: single leg on floor. Raise leg. Hold _10__ seconds. Repeat with other leg. _1__ reps per set, __2_ sets per day, _5_ days per week  Copyright  VHI. All rights reserved.   

## 2019-04-19 ENCOUNTER — Encounter: Payer: Self-pay | Admitting: Physical Therapy

## 2019-04-19 ENCOUNTER — Other Ambulatory Visit: Payer: Self-pay

## 2019-04-19 ENCOUNTER — Encounter: Payer: Self-pay | Admitting: Occupational Therapy

## 2019-04-19 ENCOUNTER — Ambulatory Visit: Payer: Self-pay | Admitting: Occupational Therapy

## 2019-04-19 DIAGNOSIS — M6281 Muscle weakness (generalized): Secondary | ICD-10-CM

## 2019-04-19 DIAGNOSIS — R41840 Attention and concentration deficit: Secondary | ICD-10-CM

## 2019-04-19 DIAGNOSIS — R41844 Frontal lobe and executive function deficit: Secondary | ICD-10-CM

## 2019-04-19 DIAGNOSIS — R278 Other lack of coordination: Secondary | ICD-10-CM

## 2019-04-19 DIAGNOSIS — R2689 Other abnormalities of gait and mobility: Secondary | ICD-10-CM

## 2019-04-19 DIAGNOSIS — R2681 Unsteadiness on feet: Secondary | ICD-10-CM

## 2019-04-19 NOTE — Therapy (Signed)
[facility] [address] [place], [place], [address] Phone: [phone]   Fax:  [phone]  Occupational Therapy Treatment  Patient Details  Name: [name] MRN: [id number] Date of Birth: May 05, 1966 Referring Provider (OT): Dr. Alger Simons   Encounter Date: 04/19/2019  OT End of Session - 04/19/19 0811    Visit Number  19    Number of Visits  24   15+9   Date for OT Re-Evaluation  05/06/19    Authorization Type  [facility] [employer] covered 100% through 07/05/19    OT Start Time  0804    OT Stop Time  0845    OT Time Calculation (min)  41 min    Activity Tolerance  Patient tolerated treatment well    Behavior During Therapy  [facility] for tasks assessed/performed;Flat affect       Past Medical History:  Diagnosis Date  . Diabetes mellitus without complication ([place])   . Seizures ([place])   . TBI (traumatic brain injury) ([place]) 03/2017    Past Surgical History:  Procedure Laterality Date  . CRANIOTOMY Right 01/13/2017   Procedure: 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, Placement of bone flap in abdomen;  Surgeon: Ashok Pall, MD;  Location: [facility];  Service: Neurosurgery;  Laterality: Right;  . CRANIOTOMY Right 03/19/2017   Procedure: Cranioplasty with placement of bone flap from abdomen;  Surgeon: Eustace Moore, MD;  Location: [facility];  Service: Neurosurgery;  Laterality: Right;    There were no vitals filed for this visit.  Subjective Assessment - 04/19/19 0808    Subjective   pt reports that he has not helped with dinner, but that morning routine is going    Patient is accompanied by:  Family member;Interpreter   interpreter via video   Pertinent History  DM, HTN, seizure disorder, hypothyroidism    Patient Stated Goals  "to be well"    Currently in Pain?  No/denies         CLINIC OPERATION CHANGES: Outpatient Neuro Rehab is open at lower capacity following universal  masking, social distancing, and patient screening.  The patient's COVID risk of complications score is 3.  Placing perfection pieces in board with min-mod cueing for problem solving and to use organized scanning pattern.  Pt instructed to use organized visual scanning strategy when looking for something.  Pt verbalized understanding.  4-step ADL sequencing cards with min-mod cueing/problem solving for sequencing and explaining what is happening in each picture and why he knows which order is correct.  Recommended sister/family prompt pt with questioning cues to plan activities (verbally or written) prior to performing activity to assist with planning, initiation, and problems solving using ("what could you do to help?,  What should you do first?  Why?  What should you do next?  Why?," etc.  Pt/sister verbalized understanding.    Discussed progress with using list for morning routine and recommended pt write list the night before with sister's help prn.    Discussed problem solving with puzzles,  Pt cued to verbalize strategy (max cueing).  Discussed separate edge pieces/middle into 2 piles, complete edges by looking at colors then shape, then complete inside.  Pt was able to repeat strategy after discussion with min prompts.  Sister will provide cueing at home.        OT Short Term Goals - 04/06/19 1401      OT SHORT TERM GOAL #1   Title  Pt will be independent  with initial HEP.--check STGs 12/27/18    Time  6    Period  Weeks    Status  Partially Met   12/29/18:  pt/family, pt needs supervision/cueing from family (but sister able to provide cueing/supervision)     OT SHORT TERM GOAL #2   Title  Pt will improve L grip strength by at least 5lbs to assit with IADLs.      Baseline  20lbs    Time  6    Period  Weeks    Status  Achieved   12/27/18  46lbs     OT SHORT TERM GOAL #3   Title  Pt will be improve LUE control/strength to able to performing targeted overhead reaching with LUE x  34mn without drops.    Time  6    Period  Weeks    Status  Achieved   12/29/18     OT SHORT TERM GOAL #4   Title  Pt/family will verbalize understanding of cognitive compensation strategies for ADLs/IADLs.    Time  6    Period  Weeks    Status  Achieved   12/29/18 with pt/family     OT SHORT TERM GOAL #5   Title  Pt will attend to functional cognitive tasks for at least 10 min with only min cueing for problem solving.    Time  6    Period  Weeks    Status  Deferred   12/27/18:  2-529m, mod cueing (dependent on task).  04/06/19:  defer to LTG       OT Long Term Goals - 04/06/19 1401      OT LONG TERM GOAL #1   Title  Pt will be independent with updated HEP.--check LTGs 05/06/19    Time  4    Period  Weeks    Status  New      OT LONG TERM GOAL #2   Title  Pt will demo at least 40lbs L grip strength to assist with IADLs.    Baseline  04/06/19:  35lbs     Time  4    Period  Weeks    Status  Revised      OT LONG TERM GOAL #3   Title  Pt will be able to retrieve/replace 3lb object on overhead shelf with LUE x3 with good safety/control.    Time  4    Period  Weeks    Status  On-going      OT LONG TERM GOAL #4   Title  Pt will perform BADLs and simple selected home maintenance tasks with distant supervision using schedule/strategies prn.    Time  12    Period  Weeks    Status  Partially Met   04/06/19 needs min v.c. inconsistently     OT LONG TERM GOAL #5   Title  Pt will perform simple snack prep, use microwave, cold meal prep and make appropriate choices mod I.     Time  12    Period  Weeks    Status  Achieved   04/06/19:  can make sandwiches, use microwave, and scramble eggs     OT LONG TERM GOAL #6   Title  Pt will attend to functional cognitive tasks for at least 10 min with only min cueing for problem solving.    Time  4    Period  Weeks    Status  New      OT LONG TERM GOAL #7  Title  Pt will be able to generate simple to do list/schedule with min prompts    Time   4    Period  Weeks    Status  New            Plan - 04/19/19 0109    Clinical Impression Statement  Pt is progressing slowly towards goals with improving performance of morning routine.  However, pt continues to need cueing for problem solving for unfamiliar tasks.    Occupational Profile and client history currently impacting functional performance  Pt was independent and working prior to injury; however, now pt needs set-up/supervision and cueing for ADLs/IADLs.  Pt enjoyed wrestling and soccer prior to injury, but is now unable to engage in these activities.    Rehab Potential  Fair    OT Frequency  2x / week    OT Duration  --   5 weeks, or 9 visits    OT Treatment/Interventions  Self-care/ADL training;Electrical Stimulation;Therapeutic exercise;Visual/perceptual remediation/compensation;Aquatic Therapy;Moist Heat;Paraffin;Neuromuscular education;Patient/family education;Splinting;Fluidtherapy;Energy conservation;Therapist, nutritional;Therapeutic activities;Cryotherapy;Ultrasound;DME and/or AE instruction;Contrast Bath;Manual Therapy;Passive range of motion;Cognitive remediation/compensation;Balance training    Plan  continue to address cogntion, problem solving, functional IADL tasks, LUE functional use, writing simple lists    Consulted and Agree with Plan of Care  Family member/caregiver;Patient;Other (Comment)    Family Member Consulted  sister, interpreter       Patient will benefit from skilled therapeutic intervention in order to improve the following deficits and impairments:           Visit Diagnosis: 1. Attention and concentration deficit   2. Other lack of coordination   3. Muscle weakness (generalized)   4. Unsteadiness on feet   5. Other abnormalities of gait and mobility   6. Frontal lobe and executive function deficit       Problem List Patient Active Problem List   Diagnosis Date Noted  . Microalbuminuria due to type 2 diabetes mellitus ([place])  04/11/2019  . Hypothyroidism 09/21/2018  . Seizure disorder ([place]) 06/16/2018  . Low testosterone 06/16/2018  . Traumatic brain injury with loss of consciousness ([place]) 10/29/2017  . Anemia 10/29/2017  . Positive depression screening 10/29/2017  . Attention and concentration deficit   . Type 2 diabetes mellitus with peripheral neuropathy ([facility])   . S/P craniotomy 03/19/2017  . Essential hypertension   . Dysphagia, oropharyngeal 02/01/2017  . Diabetes type 2, uncontrolled ([place]) 06/13/2015    [facility] 04/19/2019, 9:47 AM  [facility] [address] [place], [place], [address] Phone: [phone]   Fax:  [phone]  Name: [name] MRN: [id number] Date of Birth: [birth year]   Vianne Bulls, OTR/L [facility] [address] [place], [place]  [address] [phone] phone [phone] 04/19/19 9:47 AM

## 2019-04-19 NOTE — Therapy (Signed)
[facility] [address] [place], [place], [address] Phone: [phone]   Fax:  [phone]  Physical Therapy Evaluation  Patient Details  Name: [name] MRN: [id number] Date of Birth: 07/16/1966 Referring Provider (PT): Dr. Faith RogueZachary Swartz  CLINIC OPERATION CHANGES: Outpatient Neuro Rehab is open at lower capacity following universal masking, social distancing, and patient screening.  The patient's COVID risk of  complications score is 3.  Encounter Date: 04/18/2019  PT End of Session - 04/19/19 1549    Visit Number  1    Number of Visits  9    Date for PT Re-Evaluation  05/18/19    Authorization Type  [employer] 100% thru 07-05-19    Authorization Time Period  thru 07-05-19    Authorization - Visit Number  1    PT Start Time  1302    PT Stop Time  1350    PT Time Calculation (min)  48 min    Activity Tolerance  Patient tolerated treatment well    Behavior During Therapy  [facility] for tasks assessed/performed       Past Medical History:  Diagnosis Date  . Diabetes mellitus without complication ([facility])   . Seizures ([facility])   . TBI (traumatic brain injury) ([facility]) 03/2017    Past Surgical History:  Procedure Laterality Date  . CRANIOTOMY Right 01/13/2017   Procedure: 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, Placement of bone flap in abdomen;  Surgeon: Coletta MemosKyle Cabbell, MD;  [facility];  Service: Neurosurgery;  Laterality: Right;  . CRANIOTOMY Right 03/19/2017   Procedure: Cranioplasty with placement of bone flap from abdomen;  Surgeon: [name], [name], MD;  [facility];  Service: Neurosurgery;  Laterality: Right;    There were no vitals filed for this visit.   Subjective Assessment - 04/19/19 1526    Subjective  Pt reports he has not been able to have much activity due to Covid 19 and [employer] has been closed    Patient is accompained by:  Family member   sister interpreted due to  machine being used by another therapist   Pertinent History  TBI secondary to SDH due to fall on 01-10-17; Rt craniectomy for hematoma on 01-13-17; Cranioplasty with placement of bone flap from abdomen on 03-19-17; pt received HH (few visits per sister's report) and no additional therapies due to lack of insurance at that time;  DM type 2 with peripheral neuropathy;  seizures    Patient Stated Goals  Improve walking and balance    Currently in Pain?  No/denies         [facility] PT Assessment - 04/19/19 0001      Assessment   Medical Diagnosis  TBI due to SDH due to fall    Referring Provider (PT)  Dr. Faith RogueZachary Swartz    Onset Date/Surgical Date  01/10/17    Hand Dominance  Right    Prior Therapy  Home health - last year -  few visits only due to lack of insurance       Precautions   Precautions  Other (comment);Fall      Balance Screen   Has the patient fallen in the past 6 months  No    Has the patient had a decrease in activity level because of a fear of falling?   No    Is the patient reluctant to leave their home because of a fear of falling?   No  [place] residence    Type of Divide  Two level    Home Equipment  Grab bars - tub/shower      Prior Function   Level of Independence  Needs assistance with ADLs   now set-up/cueing for BADLs; Independent prior to injury   Vocation  Unemployed    Vocation Requirements  pt was a landscraper prior to injury    Leisure  hasn't be able to go to gym due to [employer] closures, but is walking in parking lot independently      ROM / Strength   AROM / PROM / Strength  AROM;Strength      AROM   Overall AROM   Within functional limits for tasks performed      Strength   Overall Strength  Within functional limits for tasks performed    Overall Strength Comments  bil. LE strength is WFL's      Transfers   Transfers  Sit to Stand    Sit to Stand  5: Supervision    Five time  sit to stand comments   17.85   no UE support used - from mat     Ambulation/Gait   Ambulation/Gait  Yes    Ambulation/Gait Assistance  6: Modified independent (Device/Increase time)    Ambulation/Gait Assistance Details  ataxic gait pattern with RLE more impaired than LLE; pt amb. with wide BOS and decreased push off on LLE with steppage gait noted at times    Ambulation Distance (Feet)  100 Feet    Assistive device  None    Gait Pattern  Ataxic    Ambulation Surface  Level;Indoor    Gait velocity  10.27 = 3.19 ft/sec no device    8.85 secs = 3.71 ft/sec no device    Stairs  Yes    Stairs Assistance  5: Supervision    Stair Management Technique  One rail Right;Alternating pattern;Forwards   sister states pt holds rail with both hands at home    Number of Stairs  4   3 reps = 12 steps total   Height of Stairs  6    Gait Comments  Pt able to jog 30' x  2 reps without LOB but with deviations due to ataxic gait pattern      Standardized Balance Assessment   Standardized Balance Assessment  Berg Balance Test;Dynamic Gait Index      Berg Balance Test   Sit to Stand  Able to stand without using hands and stabilize independently    Standing Unsupported  Able to stand safely 2 minutes    Sitting with Back Unsupported but Feet Supported on Floor or Stool  Able to sit safely and securely 2 minutes    Stand to Sit  Sits safely with minimal use of hands    Transfers  Able to transfer safely, minor use of hands    Standing Unsupported with Eyes Closed  Able to stand 10 seconds with supervision    Standing Unsupported with Feet Together  Able to place feet together independently and stand 1 minute safely    From Standing, Reach Forward with Outstretched Arm  Can reach confidently >25 cm (10")    From Standing Position, Pick up Object from Floor  Able to pick up shoe safely and easily    From Standing Position, Turn to Look Behind Over each Shoulder  Looks behind one side only/other side shows  less weight shift   less toward Lt side   Turn 360 Degrees  Able to turn 360 degrees safely but slowly    Standing Unsupported, Alternately Place Feet on Step/Stool  Able to stand independently and safely and complete 8 steps in 20 seconds    Standing Unsupported, One Foot in Front  Able to plae foot ahead of the other independently and hold 30 seconds    Standing on One Leg  Tries to lift leg/unable to hold 3 seconds but remains standing independently    Total Score  48      Dynamic Gait Index   Level Surface  Mild Impairment    Change in Gait Speed  Mild Impairment    Gait with Horizontal Head Turns  Mild Impairment    Gait with Vertical Head Turns  Mild Impairment    Gait and Pivot Turn  Mild Impairment    Step Over Obstacle  Mild Impairment    Step Around Obstacles  Mild Impairment    Steps  Mild Impairment    Total Score  16      Timed Up and Go Test   Normal TUG (seconds)  15.22   12.81 secs 2nd trial                Objective measurements completed on examination: See above findings.              PT Education - 04/19/19 1547    Education Details  instructed pt to practice step negotiation at home with use of only 1 hand rail (right) and to also practice SLS on each leg    Person(s) Educated  Patient    Methods  Explanation;Demonstration;Handout   for SLS ex.   Comprehension  Verbalized understanding;Returned demonstration          PT Long Term Goals - 04/19/19 1552      PT LONG TERM GOAL #1   Title  Pt will increase DGI score from 16/24 to >/= 19/24  to demo improved gait and reduced fall risk.    Baseline  16/24 on 04-18-19    Time  4    Period  Weeks    Status  New    Target Date  05/18/19      PT LONG TERM GOAL #2   Title  Improve TUG score from 15.22 secs to </= 12.5 secs without use of assistive device to demo improved functional mobility with reduced fall risk.    Baseline  15.22 secs 1st trial; 12.81 secs 2nd trial but with moderately  increased gait deviations - 04-18-19    Time  4    Period  Weeks    Status  New    Target Date  05/18/19      PT LONG TERM GOAL #3   Title  Pt will negotiate steps with 1 rail only using a step over step sequence; sister will report pt negotiating steps at home in this manner (states he currently hugs the rail with both hands due to fear of falling).     Baseline  sister states pt is now fearful with step negotiation again - as he was in previous admisison (Dec. 2019)  - 04-18-19    Time  4    Period  Weeks    Status  New    Target Date  05/18/19      PT LONG TERM GOAL #4   Title  Increase gait velocity from 3.19  ft/sec to >/=  3.7 ft/sec for increased gait efficiency.    Baseline  10.27 secs on 04-18-19 - no device used    Time  4    Period  Weeks    Status  New    Target Date  05/18/19      PT LONG TERM GOAL #5   Title  Jog  200' on flat, even surface without LOB to enable pt to return to recreational activities including playing soccer.    Baseline  jogged 60' on 04-18-19    Time  4    Period  Weeks    Status  New    Target Date  05/18/19      PT LONG TERM GOAL #6   Title  Independent in HEP for balance and strengthening, including returning to workouts at [employer] when facility reopens.    Baseline  --    Time  4    Period  Weeks    Status  New    Target Date  05/18/19      PT LONG TERM GOAL #7   Title  Pt wil perform SLS on each leg for at least 2 secs for increased safety with curb negotiation, lower body dressing in standing, etc.    Baseline  unable to stand on either leg for measurable time period    Time  4    Period  Weeks    Status  New    Target Date  05/18/19      PT LONG TERM GOAL #8   Title  --    Baseline  --    Status  --      PT LONG TERM GOAL  #9   TITLE  --    Baseline  --    Status  Achieved               Patient will benefit from skilled therapeutic intervention in order to improve the following deficits and impairments:     Visit  Diagnosis: 1. Muscle weakness (generalized)   2. Unsteadiness on feet   3. Other abnormalities of gait and mobility   4. Other lack of coordination        Problem List Patient Active Problem List   Diagnosis Date Noted  . Microalbuminuria due to type 2 diabetes mellitus ([facility]) 04/11/2019  . Hypothyroidism 09/21/2018  . Seizure disorder ([facility]) 06/16/2018  . Low testosterone 06/16/2018  . Traumatic brain injury with loss of consciousness ([facility]) 10/29/2017  . Anemia 10/29/2017  . Positive depression screening 10/29/2017  . Attention and concentration deficit   . Type 2 diabetes mellitus with peripheral neuropathy ([facility])   . S/P craniotomy 03/19/2017  . Essential hypertension   . Dysphagia, oropharyngeal 02/01/2017  . Diabetes type 2, uncontrolled ([facility]) 06/13/2015    Yaelis Scharfenberg, Donavan BurnetLinda Suzanne, PT 04/19/2019, 5:42 PM  [facility] [address] [place], [place], [address] Phone: [phone]   Fax:  [phone]  Name: [name] MRN: [id number] Date of Birth: 04-27-1966

## 2019-04-20 ENCOUNTER — Ambulatory Visit: Payer: Self-pay | Attending: Internal Medicine

## 2019-04-20 DIAGNOSIS — R7989 Other specified abnormal findings of blood chemistry: Secondary | ICD-10-CM

## 2019-04-20 DIAGNOSIS — E119 Type 2 diabetes mellitus without complications: Secondary | ICD-10-CM

## 2019-04-21 LAB — COMPREHENSIVE METABOLIC PANEL
ALT: 22 IU/L (ref 0–44)
AST: 15 IU/L (ref 0–40)
Albumin/Globulin Ratio: 1.6 (ref 1.2–2.2)
Albumin: 4.1 g/dL (ref 3.8–4.9)
Alkaline Phosphatase: 47 IU/L (ref 39–117)
BUN/Creatinine Ratio: 21 — ABNORMAL HIGH (ref 9–20)
BUN: 16 mg/dL (ref 6–24)
Bilirubin Total: 0.6 mg/dL (ref 0.0–1.2)
CO2: 24 mmol/L (ref 20–29)
Calcium: 8.4 mg/dL — ABNORMAL LOW (ref 8.7–10.2)
Chloride: 103 mmol/L (ref 96–106)
Creatinine, Ser: 0.77 mg/dL (ref 0.76–1.27)
GFR calc Af Amer: 120 mL/min/{1.73_m2} (ref >59)
GFR calc non Af Amer: 104 mL/min/{1.73_m2} (ref >59)
Globulin, Total: 2.5 g/dL (ref 1.5–4.5)
Glucose: 115 mg/dL — ABNORMAL HIGH (ref 65–99)
Potassium: 4.1 mmol/L (ref 3.5–5.2)
Sodium: 139 mmol/L (ref 134–144)
Total Protein: 6.6 g/dL (ref 6.0–8.5)

## 2019-04-21 LAB — CBC
Hematocrit: 40.2 % (ref 37.5–51.0)
Hemoglobin: 14.1 g/dL (ref 13.0–17.7)
MCH: 30.9 pg (ref 26.6–33.0)
MCHC: 35.1 g/dL (ref 31.5–35.7)
MCV: 88 fL (ref 79–97)
Platelets: 229 10*3/uL (ref 150–450)
RBC: 4.56 x10E6/uL (ref 4.14–5.80)
RDW: 12.8 % (ref 11.6–15.4)
WBC: 4.6 10*3/uL (ref 3.4–10.8)

## 2019-04-21 LAB — PSA: Prostate Specific Ag, Serum: 0.7 ng/mL (ref 0.0–4.0)

## 2019-04-21 LAB — LIPID PANEL
Chol/HDL Ratio: 2.6 ratio (ref 0.0–5.0)
Cholesterol, Total: 143 mg/dL (ref 100–199)
HDL: 54 mg/dL (ref >39)
LDL Calculated: 73 mg/dL (ref 0–99)
Triglycerides: 81 mg/dL (ref 0–149)
VLDL Cholesterol Cal: 16 mg/dL (ref 5–40)

## 2019-04-21 LAB — HEMOGLOBIN A1C
Est. average glucose Bld gHb Est-mCnc: 108 mg/dL
Hgb A1c MFr Bld: 5.4 % (ref 4.8–5.6)

## 2019-04-25 ENCOUNTER — Ambulatory Visit: Payer: Self-pay | Admitting: Occupational Therapy

## 2019-04-25 ENCOUNTER — Other Ambulatory Visit: Payer: Self-pay

## 2019-04-25 DIAGNOSIS — R278 Other lack of coordination: Secondary | ICD-10-CM

## 2019-04-25 DIAGNOSIS — R2681 Unsteadiness on feet: Secondary | ICD-10-CM

## 2019-04-25 DIAGNOSIS — R41840 Attention and concentration deficit: Secondary | ICD-10-CM

## 2019-04-25 DIAGNOSIS — M6281 Muscle weakness (generalized): Secondary | ICD-10-CM

## 2019-04-25 NOTE — Therapy (Signed)
[facility] [address] [place], [place], [address] Phone: [phone]   Fax:  [phone]  Occupational Therapy Treatment  Patient Details  Name: [name] MRN: [id number] Date of Birth: 1966-05-23 Referring Provider (OT): Dr. Alger Simons   Encounter Date: 04/25/2019  OT End of Session - 04/25/19 1405    Visit Number  20    Number of Visits  24   15+9   Date for OT Re-Evaluation  05/06/19    Authorization Type  [facility] covered 100% through 07/05/19    OT Start Time  1304    OT Stop Time  1345    OT Time Calculation (min)  41 min    Activity Tolerance  Patient tolerated treatment well    Behavior During Therapy  [facility] for tasks assessed/performed;Flat affect       Past Medical History:  Diagnosis Date  . Diabetes mellitus without complication ([place])   . Seizures ([place])   . TBI (traumatic brain injury) ([place]) 03/2017    Past Surgical History:  Procedure Laterality Date  . CRANIOTOMY Right 01/13/2017   Procedure: 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, Placement of bone flap in abdomen;  Surgeon: Ashok Pall, MD;  Location: [facility];  Service: Neurosurgery;  Laterality: Right;  . CRANIOTOMY Right 03/19/2017   Procedure: Cranioplasty with placement of bone flap from abdomen;  Surgeon: Eustace Moore, MD;  Location: [facility];  Service: Neurosurgery;  Laterality: Right;    There were no vitals filed for this visit.  Subjective Assessment - 04/25/19 1538    Patient is accompanied by:  Family member;Interpreter   interpreter via video   Pertinent History  DM, HTN, seizure disorder, hypothyroidism    Patient Stated Goals  "to be well"    Currently in Pain?  No/denies                 [facility] OPERATION CHANGES: Outpatient Neuro Rehab is open at lower capacity following universal masking, social distancing, and patient screening.  The patient's COVID risk of  complications score is 3.  Copying small peg design with LUE for problem solving and and visual scanning, min-mod v.c  4-step ADL sequencing cards with min-mod cueing/problem solving for sequencing and explaining what is happening in each picture and why he knows which order is correct, min v.c  12 piece puzzle with min-mod v.c/ questioning cues, sister interpreting for pt.    Ambulating while tossing ball between hands, min difficulty. Quadraped rocking forwards and backwards for core stability, then bird dog, 10 reps, mod facilitation for core stability and strength Closed chain shoulder flexion and diagonals, min facilitation v.c Wall pushups, min facilitation.         OT Short Term Goals - 04/06/19 1401      OT SHORT TERM GOAL #1   Title  Pt will be independent with initial HEP.--check STGs 12/27/18    Time  6    Period  Weeks    Status  Partially Met   12/29/18:  pt/family, pt needs supervision/cueing from family (but sister able to provide cueing/supervision)     OT SHORT TERM GOAL #2   Title  Pt will improve L grip strength by at least 5lbs to assit with IADLs.      Baseline  20lbs    Time  6    Period  Weeks    Status  Achieved   12/27/18  46lbs     OT  SHORT TERM GOAL #3   Title  Pt will be improve LUE control/strength to able to performing targeted overhead reaching with LUE x 33mn without drops.    Time  6    Period  Weeks    Status  Achieved   12/29/18     OT SHORT TERM GOAL #4   Title  Pt/family will verbalize understanding of cognitive compensation strategies for ADLs/IADLs.    Time  6    Period  Weeks    Status  Achieved   12/29/18 with pt/family     OT SHORT TERM GOAL #5   Title  Pt will attend to functional cognitive tasks for at least 10 min with only min cueing for problem solving.    Time  6    Period  Weeks    Status  Deferred   12/27/18:  2-519m, mod cueing (dependent on task).  04/06/19:  defer to LTG       OT Long Term Goals - 04/06/19 1401       OT LONG TERM GOAL #1   Title  Pt will be independent with updated HEP.--check LTGs 05/06/19    Time  4    Period  Weeks    Status  New      OT LONG TERM GOAL #2   Title  Pt will demo at least 40lbs L grip strength to assist with IADLs.    Baseline  04/06/19:  35lbs     Time  4    Period  Weeks    Status  Revised      OT LONG TERM GOAL #3   Title  Pt will be able to retrieve/replace 3lb object on overhead shelf with LUE x3 with good safety/control.    Time  4    Period  Weeks    Status  On-going      OT LONG TERM GOAL #4   Title  Pt will perform BADLs and simple selected home maintenance tasks with distant supervision using schedule/strategies prn.    Time  12    Period  Weeks    Status  Partially Met   04/06/19 needs min v.c. inconsistently     OT LONG TERM GOAL #5   Title  Pt will perform simple snack prep, use microwave, cold meal prep and make appropriate choices mod I.     Time  12    Period  Weeks    Status  Achieved   04/06/19:  can make sandwiches, use microwave, and scramble eggs     OT LONG TERM GOAL #6   Title  Pt will attend to functional cognitive tasks for at least 10 min with only min cueing for problem solving.    Time  4    Period  Weeks    Status  New      OT LONG TERM GOAL #7   Title  Pt will be able to generate simple to do list/schedule with min prompts    Time  4    Period  Weeks    Status  New            Plan - 04/25/19 1537    Clinical Impression Statement  Pt is progressing slowly towards goals with improving functional use of LUE.    Occupational Profile and client history currently impacting functional performance  Pt was independent and working prior to injury; however, now pt needs set-up/supervision and cueing for ADLs/IADLs.  Pt enjoyed wrestling and soccer  prior to injury, but is now unable to engage in these activities.    Rehab Potential  Fair    OT Frequency  2x / week    OT Duration  --   5 weeks, or 9 visits    OT  Treatment/Interventions  Self-care/ADL training;Electrical Stimulation;Therapeutic exercise;Visual/perceptual remediation/compensation;Aquatic Therapy;Moist Heat;Paraffin;Neuromuscular education;Patient/family education;Splinting;Fluidtherapy;Energy conservation;Therapist, nutritional;Therapeutic activities;Cryotherapy;Ultrasound;DME and/or AE instruction;Contrast Bath;Manual Therapy;Passive range of motion;Cognitive remediation/compensation;Balance training    Plan  continue to address cogntion, problem solving, functional IADL tasks, LUE functional use, writing simple lists    Consulted and Agree with Plan of Care  Family member/caregiver;Patient;Other (Comment)    Family Member Consulted  sister, interpreter       Patient will benefit from skilled therapeutic intervention in order to improve the following deficits and impairments:           Visit Diagnosis: 1. Other lack of coordination   2. Attention and concentration deficit   3. Muscle weakness (generalized)   4. Unsteadiness on feet       Problem List Patient Active Problem List   Diagnosis Date Noted  . Microalbuminuria due to type 2 diabetes mellitus ([place]) 04/11/2019  . Hypothyroidism 09/21/2018  . Seizure disorder ([place]) 06/16/2018  . Low testosterone 06/16/2018  . Traumatic brain injury with loss of consciousness ([place]) 10/29/2017  . Anemia 10/29/2017  . Positive depression screening 10/29/2017  . Attention and concentration deficit   . Type 2 diabetes mellitus with peripheral neuropathy ([facility])   . S/P craniotomy 03/19/2017  . Essential hypertension   . Dysphagia, oropharyngeal 02/01/2017  . Diabetes type 2, uncontrolled ([place]) 06/13/2015    Zoey Gilkeson 04/25/2019, 3:38 PM  [facility] [address] [place], [place], [address] Phone: [phone]   Fax:  [phone]  Name: [name] MRN: [id number] Date of Birth: 1966-04-15

## 2019-04-26 ENCOUNTER — Encounter: Payer: Self-pay | Admitting: Occupational Therapy

## 2019-04-28 ENCOUNTER — Other Ambulatory Visit: Payer: Self-pay

## 2019-04-28 ENCOUNTER — Ambulatory Visit: Payer: Self-pay | Admitting: Occupational Therapy

## 2019-04-28 DIAGNOSIS — R41844 Frontal lobe and executive function deficit: Secondary | ICD-10-CM

## 2019-04-28 DIAGNOSIS — R41840 Attention and concentration deficit: Secondary | ICD-10-CM

## 2019-04-28 DIAGNOSIS — R278 Other lack of coordination: Secondary | ICD-10-CM

## 2019-04-28 DIAGNOSIS — M6281 Muscle weakness (generalized): Secondary | ICD-10-CM

## 2019-04-28 NOTE — Therapy (Signed)
[facility] [address] [place], [place], [address] Phone: [phone]   Fax:  [phone]  Occupational Therapy Treatment  Patient Details  Name: [name] MRN: [id number] Date of Birth: 04-21-1966 Referring Provider (OT): Dr. Alger Simons   Encounter Date: 04/28/2019  OT End of Session - 04/28/19 0828    Visit Number  20    Number of Visits  24   15+9   Date for OT Re-Evaluation  05/06/19    Authorization Type  [facility] covered 100% through 07/05/19    OT Start Time  0805    OT Stop Time  0845    OT Time Calculation (min)  40 min    Activity Tolerance  Patient tolerated treatment well    Behavior During Therapy  [facility] for tasks assessed/performed;Flat affect       Past Medical History:  Diagnosis Date  . Diabetes mellitus without complication ([place])   . Seizures ([place])   . TBI (traumatic brain injury) ([place]) 03/2017    Past Surgical History:  Procedure Laterality Date  . CRANIOTOMY Right 01/13/2017   Procedure: 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, Placement of bone flap in abdomen;  Surgeon: Ashok Pall, MD;  Location: [facility];  Service: Neurosurgery;  Laterality: Right;  . CRANIOTOMY Right 03/19/2017   Procedure: Cranioplasty with placement of bone flap from abdomen;  Surgeon: Eustace Moore, MD;  Location: [facility];  Service: Neurosurgery;  Laterality: Right;    There were no vitals filed for this visit.  Subjective Assessment - 04/28/19 0810    Subjective   Denies pain    Patient is accompanied by:  Family member;Interpreter   interpreter via video   Pertinent History  DM, HTN, seizure disorder, hypothyroidism    Patient Stated Goals  "to be well"    Currently in Pain?  No/denies             [facility] OPERATION CHANGES: Outpatient Neuro Rehab is open at lower capacity following universal masking, social distancing, and patient screening.  The patient's  COVID risk of complications score is 3.  Placing grooved pegs with LUE for problem solving, coordination and and visual scanning, min-mod v.c Supine closed chain chest press and shoulder flexion with ball, min faciliation Quadraped rocking forwards and backwards for core stability, then cat and cow, followed by lifting alternate UE's, 10 reps, mod facilitation for core stability and strength Closed chain shoulder flexion in seated, min facilitation.Wall pushups, min facilitation. Standing to place / remove graded clothespins with LUE for increased LUE functional reach and sustained pinch.                OT Short Term Goals - 04/06/19 1401      OT SHORT TERM GOAL #1   Title  Pt will be independent with initial HEP.--check STGs 12/27/18    Time  6    Period  Weeks    Status  Partially Met   12/29/18:  pt/family, pt needs supervision/cueing from family (but sister able to provide cueing/supervision)     OT SHORT TERM GOAL #2   Title  Pt will improve L grip strength by at least 5lbs to assit with IADLs.      Baseline  20lbs    Time  6    Period  Weeks    Status  Achieved   12/27/18  46lbs     OT SHORT TERM GOAL #3   Title  Pt  will be improve LUE control/strength to able to performing targeted overhead reaching with LUE x 72mn without drops.    Time  6    Period  Weeks    Status  Achieved   12/29/18     OT SHORT TERM GOAL #4   Title  Pt/family will verbalize understanding of cognitive compensation strategies for ADLs/IADLs.    Time  6    Period  Weeks    Status  Achieved   12/29/18 with pt/family     OT SHORT TERM GOAL #5   Title  Pt will attend to functional cognitive tasks for at least 10 min with only min cueing for problem solving.    Time  6    Period  Weeks    Status  Deferred   12/27/18:  2-567m, mod cueing (dependent on task).  04/06/19:  defer to LTG       OT Long Term Goals - 04/06/19 1401      OT LONG TERM GOAL #1   Title  Pt will be independent with  updated HEP.--check LTGs 05/06/19    Time  4    Period  Weeks    Status  New      OT LONG TERM GOAL #2   Title  Pt will demo at least 40lbs L grip strength to assist with IADLs.    Baseline  04/06/19:  35lbs     Time  4    Period  Weeks    Status  Revised      OT LONG TERM GOAL #3   Title  Pt will be able to retrieve/replace 3lb object on overhead shelf with LUE x3 with good safety/control.    Time  4    Period  Weeks    Status  On-going      OT LONG TERM GOAL #4   Title  Pt will perform BADLs and simple selected home maintenance tasks with distant supervision using schedule/strategies prn.    Time  12    Period  Weeks    Status  Partially Met   04/06/19 needs min v.c. inconsistently     OT LONG TERM GOAL #5   Title  Pt will perform simple snack prep, use microwave, cold meal prep and make appropriate choices mod I.     Time  12    Period  Weeks    Status  Achieved   04/06/19:  can make sandwiches, use microwave, and scramble eggs     OT LONG TERM GOAL #6   Title  Pt will attend to functional cognitive tasks for at least 10 min with only min cueing for problem solving.    Time  4    Period  Weeks    Status  New      OT LONG TERM GOAL #7   Title  Pt will be able to generate simple to do list/schedule with min prompts    Time  4    Period  Weeks    Status  New              Patient will benefit from skilled therapeutic intervention in order to improve the following deficits and impairments:           Visit Diagnosis: 1. Other lack of coordination   2. Attention and concentration deficit   3. Muscle weakness (generalized)   4. Frontal lobe and executive function deficit       Problem List Patient Active Problem  List   Diagnosis Date Noted  . Microalbuminuria due to type 2 diabetes mellitus ([place]) 04/11/2019  . Hypothyroidism 09/21/2018  . Seizure disorder ([place]) 06/16/2018  . Low testosterone 06/16/2018  . Traumatic brain injury with loss of consciousness  ([place]) 10/29/2017  . Anemia 10/29/2017  . Positive depression screening 10/29/2017  . Attention and concentration deficit   . Type 2 diabetes mellitus with peripheral neuropathy ([facility])   . S/P craniotomy 03/19/2017  . Essential hypertension   . Dysphagia, oropharyngeal 02/01/2017  . Diabetes type 2, uncontrolled ([place]) 06/13/2015    RINE,KATHRYN 04/28/2019, 8:41 AM  [facility] [address] [place], [place], [address] Phone: [phone]   Fax:  [phone]  Name: [name] MRN: [id number] Date of Birth: 1966/06/11

## 2019-05-01 ENCOUNTER — Ambulatory Visit: Payer: Self-pay | Admitting: Occupational Therapy

## 2019-05-01 ENCOUNTER — Other Ambulatory Visit: Payer: Self-pay

## 2019-05-01 ENCOUNTER — Encounter: Payer: Self-pay | Admitting: Occupational Therapy

## 2019-05-01 DIAGNOSIS — R2689 Other abnormalities of gait and mobility: Secondary | ICD-10-CM

## 2019-05-01 DIAGNOSIS — M6281 Muscle weakness (generalized): Secondary | ICD-10-CM

## 2019-05-01 DIAGNOSIS — R41844 Frontal lobe and executive function deficit: Secondary | ICD-10-CM

## 2019-05-01 DIAGNOSIS — R2681 Unsteadiness on feet: Secondary | ICD-10-CM

## 2019-05-01 DIAGNOSIS — R41840 Attention and concentration deficit: Secondary | ICD-10-CM

## 2019-05-01 DIAGNOSIS — R278 Other lack of coordination: Secondary | ICD-10-CM

## 2019-05-01 NOTE — Therapy (Signed)
[facility] [address] [place], [place], [address] Phone: [phone]   Fax:  [phone]  Occupational Therapy Treatment  Patient Details  Name: [name] MRN: [id number] Date of Birth: 21-Feb-1966 Referring Provider (OT): Dr. Alger Simons   Encounter Date: 05/01/2019  OT End of Session - 05/01/19 0827    Visit Number  22   corrected #   Number of Visits  24   15+9   Date for OT Re-Evaluation  05/06/19    Authorization Type  [facility] covered 100% through 07/05/19    OT Start Time  0803    OT Stop Time  0845    OT Time Calculation (min)  42 min    Activity Tolerance  Patient tolerated treatment well    Behavior During Therapy  [facility] for tasks assessed/performed;Flat affect       Past Medical History:  Diagnosis Date  . Diabetes mellitus without complication ([place])   . Seizures ([place])   . TBI (traumatic brain injury) ([place]) 03/2017    Past Surgical History:  Procedure Laterality Date  . CRANIOTOMY Right 01/13/2017   Procedure: 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, Placement of bone flap in abdomen;  Surgeon: Ashok Pall, MD;  Location: [facility];  Service: Neurosurgery;  Laterality: Right;  . CRANIOTOMY Right 03/19/2017   Procedure: Cranioplasty with placement of bone flap from abdomen;  Surgeon: Eustace Moore, MD;  Location: [facility];  Service: Neurosurgery;  Laterality: Right;    There were no vitals filed for this visit.  Subjective Assessment - 05/01/19 0824    Subjective   I need help  (re:  morning routine)    Patient is accompanied by:  Family member;Interpreter   interpreter via video   Pertinent History  DM, HTN, seizure disorder, hypothyroidism    Patient Stated Goals  "to be well"    Currently in Pain?  No/denies         CLINIC OPERATION CHANGES: Outpatient Neuro Rehab is open at lower capacity following universal masking, social distancing, and  patient screening.  The patient's COVID risk of complications score is 3.  In quadruped, forward/back wt. Shifts, alternating UE lifts for incr shoulder/core stability and neuro re-ed with min cueing for technique.  Pt generated verbally and wrote down steps for familiar task (making scrambled eggs, which he does frequently).  Pt needed mod cueing for problem solving and planning.    Discussed progress with morning routine/list.  Pt only able to complete approx 50% of list with cueing for initiation to check list, needs additional cueing for remainder of list.    Educated pt/caregiver that due to difficulty with attention, memory, planning and initiation, sister should use short, simple instructions when possible and try to limit/give 2 choices.   Also reviewed recommendation to have pt verbally (or write) steps of task to incr problem solving when possible.     Recommended sister have meal prepared in container for pt to heat up vs. Something that requires assembly (and write name on container) as pt frequently doesn't eat or only will eat scrambled eggs.  Recommended continued supervision for medication management and cue pt to date/empty section of med organizer (simple, short cues) if he tries to take medication twice. Pt/sister verbalized understanding.    OT Short Term Goals - 04/06/19 1401      OT SHORT TERM GOAL #1   Title  Pt will be independent with initial HEP.--check STGs 12/27/18  Time  6    Period  Weeks    Status  Partially Met   12/29/18:  pt/family, pt needs supervision/cueing from family (but sister able to provide cueing/supervision)     OT SHORT TERM GOAL #2   Title  Pt will improve L grip strength by at least 5lbs to assit with IADLs.      Baseline  20lbs    Time  6    Period  Weeks    Status  Achieved   12/27/18  46lbs     OT SHORT TERM GOAL #3   Title  Pt will be improve LUE control/strength to able to performing targeted overhead reaching with LUE x 49mn without  drops.    Time  6    Period  Weeks    Status  Achieved   12/29/18     OT SHORT TERM GOAL #4   Title  Pt/family will verbalize understanding of cognitive compensation strategies for ADLs/IADLs.    Time  6    Period  Weeks    Status  Achieved   12/29/18 with pt/family     OT SHORT TERM GOAL #5   Title  Pt will attend to functional cognitive tasks for at least 10 min with only min cueing for problem solving.    Time  6    Period  Weeks    Status  Deferred   12/27/18:  2-557m, mod cueing (dependent on task).  04/06/19:  defer to LTG       OT Long Term Goals - 04/06/19 1401      OT LONG TERM GOAL #1   Title  Pt will be independent with updated HEP.--check LTGs 05/06/19    Time  4    Period  Weeks    Status  New      OT LONG TERM GOAL #2   Title  Pt will demo at least 40lbs L grip strength to assist with IADLs.    Baseline  04/06/19:  35lbs     Time  4    Period  Weeks    Status  Revised      OT LONG TERM GOAL #3   Title  Pt will be able to retrieve/replace 3lb object on overhead shelf with LUE x3 with good safety/control.    Time  4    Period  Weeks    Status  On-going      OT LONG TERM GOAL #4   Title  Pt will perform BADLs and simple selected home maintenance tasks with distant supervision using schedule/strategies prn.    Time  12    Period  Weeks    Status  Partially Met   04/06/19 needs min v.c. inconsistently     OT LONG TERM GOAL #5   Title  Pt will perform simple snack prep, use microwave, cold meal prep and make appropriate choices mod I.     Time  12    Period  Weeks    Status  Achieved   04/06/19:  can make sandwiches, use microwave, and scramble eggs     OT LONG TERM GOAL #6   Title  Pt will attend to functional cognitive tasks for at least 10 min with only min cueing for problem solving.    Time  4    Period  Weeks    Status  New      OT LONG TERM GOAL #7   Title  Pt will be able to generate  simple to do list/schedule with min prompts    Time  4    Period   Weeks    Status  New            Plan - 05/01/19 8875    Clinical Impression Statement  Pt is progressing towards goals. He remains limited by cognitve deficits and impulsivity.    Occupational Profile and client history currently impacting functional performance  Pt was independent and working prior to injury; however, now pt needs set-up/supervision and cueing for ADLs/IADLs.  Pt enjoyed wrestling and soccer prior to injury, but is now unable to engage in these activities.    Rehab Potential  Fair    OT Frequency  2x / week    OT Duration  --   5 weeks, or 9 visits    OT Treatment/Interventions  Self-care/ADL training;Electrical Stimulation;Therapeutic exercise;Visual/perceptual remediation/compensation;Aquatic Therapy;Moist Heat;Paraffin;Neuromuscular education;Patient/family education;Splinting;Fluidtherapy;Energy conservation;Therapist, nutritional;Therapeutic activities;Cryotherapy;Ultrasound;DME and/or AE instruction;Contrast Bath;Manual Therapy;Passive range of motion;Cognitive remediation/compensation;Balance training    Plan  begin checking LTGs and prep for anticipated d/c next week (only scheduled for 2 more OT sessions), review and issue quadraped (forward/back wt. shifts and alternating arm lifts) for updated HEP    Consulted and Agree with Plan of Care  Family member/caregiver;Patient;Other (Comment)    Family Member Consulted  sister, interpreter       Patient will benefit from skilled therapeutic intervention in order to improve the following deficits and impairments:           Visit Diagnosis: 1. Frontal lobe and executive function deficit   2. Other lack of coordination   3. Attention and concentration deficit   4. Muscle weakness (generalized)   5. Unsteadiness on feet   6. Other abnormalities of gait and mobility       Problem List Patient Active Problem List   Diagnosis Date Noted  . Microalbuminuria due to type 2 diabetes mellitus ([place])  04/11/2019  . Hypothyroidism 09/21/2018  . Seizure disorder ([place]) 06/16/2018  . Low testosterone 06/16/2018  . Traumatic brain injury with loss of consciousness ([place]) 10/29/2017  . Anemia 10/29/2017  . Positive depression screening 10/29/2017  . Attention and concentration deficit   . Type 2 diabetes mellitus with peripheral neuropathy ([facility])   . S/P craniotomy 03/19/2017  . Essential hypertension   . Dysphagia, oropharyngeal 02/01/2017  . Diabetes type 2, uncontrolled ([place]) 06/13/2015    [facility] 05/01/2019, 9:03 AM  [facility] [address] [place], [place], [address] Phone: [phone]   Fax:  [phone]  Name: [name] MRN: [id number] Date of Birth: [birth year]   Vianne Bulls, OTR/L [facility] [address] [place], [place]  [address] [phone] phone [phone] 05/01/19 9:03 AM

## 2019-05-02 ENCOUNTER — Ambulatory Visit: Payer: Self-pay | Admitting: Occupational Therapy

## 2019-05-02 ENCOUNTER — Ambulatory Visit: Payer: Self-pay | Admitting: Physical Therapy

## 2019-05-02 DIAGNOSIS — R278 Other lack of coordination: Secondary | ICD-10-CM

## 2019-05-02 DIAGNOSIS — R41844 Frontal lobe and executive function deficit: Secondary | ICD-10-CM

## 2019-05-02 DIAGNOSIS — R41840 Attention and concentration deficit: Secondary | ICD-10-CM

## 2019-05-02 DIAGNOSIS — M6281 Muscle weakness (generalized): Secondary | ICD-10-CM

## 2019-05-02 DIAGNOSIS — R2681 Unsteadiness on feet: Secondary | ICD-10-CM

## 2019-05-02 DIAGNOSIS — R2689 Other abnormalities of gait and mobility: Secondary | ICD-10-CM

## 2019-05-02 NOTE — Therapy (Signed)
[facility] [address] [place], [place], [address] Phone: [phone]   Fax:  [phone]  Occupational Therapy Treatment  Patient Details  Name: [name] MRN: [id number] Date of Birth: 10-29-1966 Referring Provider (OT): Dr. Alger Simons   Encounter Date: 05/02/2019  OT End of Session - 05/02/19 0816    Visit Number  23   corrected #   Number of Visits  24   15+9   Date for OT Re-Evaluation  05/06/19    Authorization Type  [facility] [employer] covered 100% through 07/05/19    OT Start Time  0803    OT Stop Time  0845    OT Time Calculation (min)  42 min    Activity Tolerance  Patient tolerated treatment well    Behavior During Therapy  [facility] for tasks assessed/performed;Flat affect       Past Medical History:  Diagnosis Date  . Diabetes mellitus without complication ([place])   . Seizures ([place])   . TBI (traumatic brain injury) ([place]) 03/2017    Past Surgical History:  Procedure Laterality Date  . CRANIOTOMY Right 01/13/2017   Procedure: 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, Placement of bone flap in abdomen;  Surgeon: Ashok Pall, MD;  Location: [facility];  Service: Neurosurgery;  Laterality: Right;  . CRANIOTOMY Right 03/19/2017   Procedure: Cranioplasty with placement of bone flap from abdomen;  Surgeon: Eustace Moore, MD;  Location: [place];  Service: Neurosurgery;  Laterality: Right;    There were no vitals filed for this visit.  Subjective Assessment - 05/02/19 0811    Subjective   I need help  (re:  morning routine)    Patient is accompanied by:  Family member;Interpreter   interpreter via video   Pertinent History  DM, HTN, seizure disorder, hypothyroidism    Patient Stated Goals  "to be well"    Currently in Pain?  No/denies              CLINIC OPERATION CHANGES: Outpatient Neuro Rehab is open at lower capacity following universal masking, social  distancing, and patient screening.  The patient's COVID risk of complications score is 3. Therapist started checking progress towards goals, see goals for updates. Placing perfection pieces with LUE for problem solving, coordination and and visual scanning, min-mod v.c Pipe tree design, mod v.c initally then pt improved and required only min v.c, pt was able to maintain attention to functional tasks for greater than 10 mins Quadraped rocking forwards and backwards for core stability,  followed by lifting alternate UE's, 10 reps, min facilitation for core stability and strength Closed chain shoulder flexion and diagonals in seated, min facilitation/ v.c Standing to place / remove graded clothespins with LUE for increased LUE functional reach and sustained pinch. Arm bike x 6 mins level 3 for conditioning, min v.c for speed.               OT Short Term Goals - 04/06/19 1401      OT SHORT TERM GOAL #1   Title  Pt will be independent with initial HEP.--check STGs 12/27/18    Time  6    Period  Weeks    Status  Partially Met   12/29/18:  pt/family, pt needs supervision/cueing from family (but sister able to provide cueing/supervision)     OT SHORT TERM GOAL #2   Title  Pt will improve L grip strength by at least 5lbs to assit with IADLs.  Baseline  20lbs    Time  6    Period  Weeks    Status  Achieved   12/27/18  46lbs     OT SHORT TERM GOAL #3   Title  Pt will be improve LUE control/strength to able to performing targeted overhead reaching with LUE x 19mn without drops.    Time  6    Period  Weeks    Status  Achieved   12/29/18     OT SHORT TERM GOAL #4   Title  Pt/family will verbalize understanding of cognitive compensation strategies for ADLs/IADLs.    Time  6    Period  Weeks    Status  Achieved   12/29/18 with pt/family     OT SHORT TERM GOAL #5   Title  Pt will attend to functional cognitive tasks for at least 10 min with only min cueing for problem solving.     Time  6    Period  Weeks    Status  Deferred   12/27/18:  2-510m, mod cueing (dependent on task).  04/06/19:  defer to LTG       OT Long Term Goals - 05/02/19 0817      OT LONG TERM GOAL #1   Title  Pt will be independent with updated HEP.--check LTGs 05/06/19    Time  4    Period  Weeks    Status  On-going      OT LONG TERM GOAL #2   Title  Pt will demo at least 40lbs L grip strength to assist with IADLs.    Baseline  04/06/19:  35lbs     Time  4    Period  Weeks    Status  Achieved   43     OT LONG TERM GOAL #3   Title  Pt will be able to retrieve/replace 3lb object on overhead shelf with LUE x3 with good safety/control.    Time  4    Period  Weeks    Status  Achieved      OT LONG TERM GOAL #4   Title  Pt will perform BADLs and simple selected home maintenance tasks with distant supervision using schedule/strategies prn.    Time  12    Period  Weeks    Status  Partially Met   04/06/19 needs min v.c. inconsistently     OT LONG TERM GOAL #5   Title  Pt will perform simple snack prep, use microwave, cold meal prep and make appropriate choices mod I.     Time  12    Period  Weeks    Status  Achieved   04/06/19:  can make sandwiches, use microwave, and scramble eggs     OT LONG TERM GOAL #6   Title  Pt will attend to functional cognitive tasks for at least 10 min with only min cueing for problem solving.    Time  4    Period  Weeks    Status  Partially Met   attends for 10 mins however mod v.c at times for problem solving     OT LONG TERM GOAL #7   Title  Pt will be able to generate simple to do list/schedule with min prompts    Time  4    Period  Weeks    Status  On-going            Plan - 05/02/19 1108    Clinical Impression Statement  Pt  is progressing towards goals. Pt/ sister verbalize understanding that next visit will be his last visit with OT.    Occupational Profile and client history currently impacting functional performance  Pt was independent and  working prior to injury; however, now pt needs set-up/supervision and cueing for ADLs/IADLs.  Pt enjoyed wrestling and soccer prior to injury, but is now unable to engage in these activities.    Rehab Potential  Fair    OT Frequency  2x / week    OT Duration  --   5 weeks, or 9 visits    OT Treatment/Interventions  Self-care/ADL training;Electrical Stimulation;Therapeutic exercise;Visual/perceptual remediation/compensation;Aquatic Therapy;Moist Heat;Paraffin;Neuromuscular education;Patient/family education;Splinting;Fluidtherapy;Energy conservation;Therapist, nutritional;Therapeutic activities;Cryotherapy;Ultrasound;DME and/or AE instruction;Contrast Bath;Manual Therapy;Passive range of motion;Cognitive remediation/compensation;Balance training    Plan  finish checking goals,  issue quadraped (forward/back wt. shifts and alternating arm lifts) for updated HEP    Consulted and Agree with Plan of Care  Family member/caregiver;Patient;Other (Comment)    Family Member Consulted  sister, interpreter       Patient will benefit from skilled therapeutic intervention in order to improve the following deficits and impairments:           Visit Diagnosis: 1. Other lack of coordination   2. Attention and concentration deficit   3. Muscle weakness (generalized)   4. Frontal lobe and executive function deficit   5. Unsteadiness on feet   6. Other abnormalities of gait and mobility       Problem List Patient Active Problem List   Diagnosis Date Noted  . Microalbuminuria due to type 2 diabetes mellitus ([place]) 04/11/2019  . Hypothyroidism 09/21/2018  . Seizure disorder ([place]) 06/16/2018  . Low testosterone 06/16/2018  . Traumatic brain injury with loss of consciousness ([place]) 10/29/2017  . Anemia 10/29/2017  . Positive depression screening 10/29/2017  . Attention and concentration deficit   . Type 2 diabetes mellitus with peripheral neuropathy ([facility])   . S/P craniotomy 03/19/2017  .  Essential hypertension   . Dysphagia, oropharyngeal 02/01/2017  . Diabetes type 2, uncontrolled ([place]) 06/13/2015    RINE,KATHRYN 05/02/2019, 11:17 AM  [facility] [address] [place], [place], [address] Phone: [phone]   Fax:  [phone]  Name: [name] MRN: [id number] Date of Birth: [birth year]

## 2019-05-02 NOTE — Therapy (Addendum)
[facility] [address] [place], [place], [address] Phone: [phone]   Fax:  [phone]  Physical Therapy Treatment  Patient Details  Name: Jamie BeckwithMiguel Garcia Berg MRN: [id number] Date of Birth: 01/13/1966 Referring Provider (PT): Dr. Faith RogueZachary Swartz  CLINIC OPERATION CHANGES: Outpatient Neuro Rehab is open at lower capacity following universal masking, social distancing, and patient screening.  The patient's COVID risk of  complications score is 3.  Encounter Date: 05/02/2019  PT End of Session - 05/02/19 1002    Visit Number  2    Number of Visits  9    Date for PT Re-Evaluation  05/18/19    Authorization Type  [employer] 100% thru 07-05-19    Authorization Time Period  thru 07-05-19    Authorization - Visit Number  2    PT Start Time  0903    PT Stop Time  0946    PT Time Calculation (min)  43 min    Activity Tolerance  Patient tolerated treatment well    Behavior During Therapy  [facility] for tasks assessed/performed       Past Medical History:  Diagnosis Date  . Diabetes mellitus without complication ([facility])   . Seizures ([facility])   . TBI (traumatic brain injury) ([facility]) 03/2017    Past Surgical History:  Procedure Laterality Date  . CRANIOTOMY Right 01/13/2017   Procedure: 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, Placement of bone flap in abdomen;  Surgeon: Coletta MemosKyle Cabbell, MD;  [facility];  Service: Neurosurgery;  Laterality: Right;  . CRANIOTOMY Right 03/19/2017   Procedure: Cranioplasty with placement of bone flap from abdomen;  Surgeon: [name], [name], MD;  [facility];  Service: Neurosurgery;  Laterality: Right;    There were no vitals filed for this visit.  Subjective Assessment - 05/02/19 0903    Subjective  Pt denies falls or changes.    Currently in Pain?  No/denies          [facility] Adult PT Treatment/Exercise - 05/02/19 0001      Transfers   Transfers  Sit to Stand    Sit  to Stand  5: Supervision      Ambulation/Gait   Ambulation/Gait  Yes    Ambulation/Gait Assistance  5: Supervision    Ambulation Distance (Feet)  600 Feet    Assistive device  None    Gait Pattern  Ataxic;Wide base of support    Ambulation Surface  Level;Indoor;Unlevel;Outdoor    Stairs  Yes    Stairs Assistance  5: Supervision    Stairs Assistance Details (indicate cue type and reason)  cues for foot placement, sequence and BOS    Stair Management Technique  One rail Left;Alternating pattern;Step to pattern;Forwards    Number of Stairs  4   repeated 8 times   Height of Stairs  6    Gait Comments  Used walking poles with PTA holding end to assist with arm swing and cadence.  Pt has difficult time with alternating arm swing.      High Level Balance   High Level Balance Comments  Standing taps to 6" step alternating LE's then taps to 6", 12", 6" and floor.  Pt with wide BOS and very fearful with SLS.  Technique improved with repetition.      Exercises   Exercises  Knee/Hip;Lumbar      Lumbar Exercises: Supine   Bridge  10 reps;2 seconds      Lumbar Exercises: Quadruped  Opposite Arm/Leg Raise  Right arm/Left leg;Left arm/Right leg;10 reps;2 seconds;Limitations    Opposite Arm/Leg Raise Limitations  used red ball initially for stability under trunk    Other Quadruped Lumbar Exercises  crawling quadruped forward/backward on mat x 8 reps      Knee/Hip Exercises: Supine   Other Supine Knee/Hip Exercises  Hooklying single leg abduction with green theraband x 10 reps single leg then double leg      Knee/Hip Exercises: Sidelying   Hip ABduction  Right;10 reps;Other (comment)   3# weight   Other Sidelying Knee/Hip Exercises  clamshell green theraband x 10 L hip abduction        PT Long Term Goals - 04/19/19 1552      PT LONG TERM GOAL #1   Title  Pt will increase DGI score from 16/24 to >/= 19/24  to demo improved gait and reduced fall risk.    Baseline  16/24 on 04-18-19     Time  4    Period  Weeks    Status  New    Target Date  05/18/19      PT LONG TERM GOAL #2   Title  Improve TUG score from 15.22 secs to </= 12.5 secs without use of assistive device to demo improved functional mobility with reduced fall risk.    Baseline  15.22 secs 1st trial; 12.81 secs 2nd trial but with moderately increased gait deviations - 04-18-19    Time  4    Period  Weeks    Status  New    Target Date  05/18/19      PT LONG TERM GOAL #3   Title  Pt will negotiate steps with 1 rail only using a step over step sequence; sister will report pt negotiating steps at home in this manner (states he currently hugs the rail with both hands due to fear of falling).     Baseline  sister states pt is now fearful with step negotiation again - as he was in previous admisison (Dec. 2019)  - 04-18-19    Time  4    Period  Weeks    Status  New    Target Date  05/18/19      PT LONG TERM GOAL #4   Title  Increase gait velocity from 3.19  ft/sec to >/= 3.7 ft/sec for increased gait efficiency.    Baseline  10.27 secs on 04-18-19 - no device used    Time  4    Period  Weeks    Status  New    Target Date  05/18/19      PT LONG TERM GOAL #5   Title  Jog  200' on flat, even surface without LOB to enable pt to return to recreational activities including playing soccer.    Baseline  jogged 60' on 04-18-19    Time  4    Period  Weeks    Status  New    Target Date  05/18/19      PT LONG TERM GOAL #6   Title  Independent in HEP for balance and strengthening, including returning to workouts at [facility] when facility reopens.    Baseline  --    Time  4    Period  Weeks    Status  New    Target Date  05/18/19      PT LONG TERM GOAL #7   Title  Pt wil perform SLS on each leg for at least 2  secs for increased safety with curb negotiation, lower body dressing in standing, etc.    Baseline  unable to stand on either leg for measurable time period    Time  4    Period  Weeks    Status  New    Target  Date  05/18/19      PT LONG TERM GOAL #8   Title  --    Baseline  --    Status  --      PT LONG TERM GOAL  #9   TITLE  --    Baseline  --    Status  Achieved            Plan - 05/02/19 1111    Clinical Impression Statement  Pt continues with fear of falling and increased BOS with most activities.  Decreased motor planning and coordination.  Improves with repetition and repeated instruction.  Works hard during session without significant rest breaks needed.  Continue PT per POC.    Rehab Potential  Good    Clinical Impairments Affecting Rehab Potential  language barrier and cognitive deficits - pt's sister [name] interpreted at eval    PT Frequency  2x / week    PT Duration  6 weeks    PT Treatment/Interventions  ADLs/Self Care Home Management;Aquatic Therapy;Gait training;Stair training;Therapeutic activities;Therapeutic exercise;Balance training;Neuromuscular re-education;Patient/family education    PT Next Visit Plan  Continue coordination activities, gait.  Try eliptical and treadmill?    PT Home Exercise Plan   [id number]; added side, forward and back [facility]27-18    Consulted and Agree with Plan of Care  Patient;Family member/caregiver    Family Member Consulted  sister [name]       Patient will benefit from skilled therapeutic intervention in order to improve the following deficits and impairments:  Abnormal gait, Decreased activity tolerance, Decreased balance, Decreased cognition, Decreased coordination, Decreased strength  Visit Diagnosis: 1. Muscle weakness (generalized)   2. Other abnormalities of gait and mobility   3. Unsteadiness on feet   4. Other lack of coordination        Problem List Patient Active Problem List   Diagnosis Date Noted  . Microalbuminuria due to type 2 diabetes mellitus ([facility]) 04/11/2019  . Hypothyroidism 09/21/2018  . Seizure disorder ([facility]) 06/16/2018  . Low testosterone 06/16/2018  . Traumatic brain  injury with loss of consciousness ([facility]) 10/29/2017  . Anemia 10/29/2017  . Positive depression screening 10/29/2017  . Attention and concentration deficit   . Type 2 diabetes mellitus with peripheral neuropathy ([facility])   . S/P craniotomy 03/19/2017  . Essential hypertension   . Dysphagia, oropharyngeal 02/01/2017  . Diabetes type 2, uncontrolled ([facility]) 06/13/2015    Newell Coralenise Terry Tajuana [place], [place] [facility] 05/02/19 11:16 AM Phone: [phone] Fax: [phone]  [facility] [address] [place], [place], [address] Phone: [phone]   Fax:  [phone]  Name: Jamie BeckwithMiguel Garcia Berg MRN: [id number] Date of Birth: 09-26-1966

## 2019-05-03 ENCOUNTER — Other Ambulatory Visit: Payer: Self-pay

## 2019-05-03 ENCOUNTER — Encounter: Payer: Self-pay | Admitting: Physical Therapy

## 2019-05-03 ENCOUNTER — Ambulatory Visit: Payer: Self-pay | Attending: Physical Medicine & Rehabilitation | Admitting: Physical Therapy

## 2019-05-03 ENCOUNTER — Ambulatory Visit: Payer: Self-pay | Admitting: Physical Therapy

## 2019-05-03 ENCOUNTER — Encounter: Payer: Self-pay | Admitting: Occupational Therapy

## 2019-05-03 DIAGNOSIS — R278 Other lack of coordination: Secondary | ICD-10-CM | POA: Insufficient documentation

## 2019-05-03 DIAGNOSIS — R41844 Frontal lobe and executive function deficit: Secondary | ICD-10-CM | POA: Insufficient documentation

## 2019-05-03 DIAGNOSIS — R2681 Unsteadiness on feet: Secondary | ICD-10-CM | POA: Insufficient documentation

## 2019-05-03 DIAGNOSIS — R41840 Attention and concentration deficit: Secondary | ICD-10-CM | POA: Insufficient documentation

## 2019-05-03 DIAGNOSIS — M6281 Muscle weakness (generalized): Secondary | ICD-10-CM | POA: Insufficient documentation

## 2019-05-03 DIAGNOSIS — R2689 Other abnormalities of gait and mobility: Secondary | ICD-10-CM | POA: Insufficient documentation

## 2019-05-03 NOTE — Therapy (Signed)
[facility] [address] [place], [place], [address] Phone: [phone]   Fax:  [phone]  Physical Therapy Treatment  Patient Details  Name: Jamie BeckwithMiguel Garcia Berg MRN: [id number] Date of Birth: 1966-03-03 Referring Provider (PT): Dr. Faith RogueZachary Swartz  CLINIC OPERATION CHANGES: Outpatient Neuro Rehab is open at lower capacity following universal masking, social distancing, and patient screening.  The patient's COVID risk of complications score is 3.  Encounter Date: 05/03/2019  PT End of Session - 05/03/19 1250    Visit Number  3    Number of Visits  9    Date for PT Re-Evaluation  05/18/19    Authorization Type  [employer] 100% thru 07-05-19    Authorization Time Period  thru 07-05-19    Authorization - Visit Number  3    PT Start Time  1105    PT Stop Time  1146    PT Time Calculation (min)  41 min    Equipment Utilized During Treatment  Gait belt    Activity Tolerance  Patient tolerated treatment well    Behavior During Therapy  WFL for tasks assessed/performed       Past Medical History:  Diagnosis Date  . Diabetes mellitus without complication (HCC)   . Seizures (HCC)   . TBI (traumatic brain injury) (HCC) 03/2017    Past Surgical History:  Procedure Laterality Date  . CRANIOTOMY Right 01/13/2017   Procedure: 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, Placement of bone flap in abdomen;  Surgeon: Coletta MemosKyle Cabbell, MD;  [facility];  Service: Neurosurgery;  Laterality: Right;  . CRANIOTOMY Right 03/19/2017   Procedure: Cranioplasty with placement of bone flap from abdomen;  Surgeon: [name], [name], MD;  [facility];  Service: Neurosurgery;  Laterality: Right;    There were no vitals filed for this visit.  Subjective Assessment - 05/03/19 1238    Subjective  Denies falls or changes.    Patient is accompained by:  Family member   sister interpreted due to machine being used by another  therapist   Pertinent History  TBI secondary to SDH due to fall on 01-10-17; Rt craniectomy for hematoma on 01-13-17; Cranioplasty with placement of bone flap from abdomen on 03-19-17; pt received HH (few visits per sister's report) and no additional therapies due to lack of insurance at that time;  DM type 2 with peripheral neuropathy;  seizures    Patient Stated Goals  Improve walking and balance    Currently in Pain?  No/denies         [facility] Adult PT Treatment/Exercise - 05/03/19 0001      Transfers   Transfers  Sit to Stand    Sit to Stand  5: Supervision      Ambulation/Gait   Ambulation/Gait  Yes    Ambulation/Gait Assistance  5: Supervision;4: Min guard    Ambulation Distance (Feet)  300 Feet   plus jogging around track in gym with Min guard assist   Assistive device  None    Gait Pattern  Ataxic;Wide base of support    Ambulation Surface  Level;Indoor    Gait Comments  Cues for maintaining speed and intensity with jogging along with alternating arm swing.  Very wide BOS and decreased heel strike.      High Level Balance   High Level Balance Activities  Marching forwards;Other (comment)   Jumping on trampoline in parallel bars 30 sec x 4 reps   High Level Balance  Comments  Verbal and tactile cues for marching in place then forward marching while encouraging alternating UE lifts.  Difficulty coordinating movements.      Exercises   Exercises  Other Exercises      Lumbar Exercises: Aerobic   Elliptical  2 minutes forward level 1.5 with min guard assist.  Needed seated rest break aftern 2 minutes.    Other Aerobic Exercise  Scifit level all 4 extremities x 2.5 x 8 minutes with rpm>70 and moderate cues to maintain intensity      Lumbar Exercises: Quadruped   Opposite Arm/Leg Raise  Right arm/Left leg;Left arm/Right leg;10 reps;2 seconds;Limitations    Opposite Arm/Leg Raise Limitations  difficulty maintaining neutral trunk/hip stability and mod-max verbal and tactile cues to  sequence.      Knee/Hip Exercises: Standing   Heel Raises  Both;1 set;15 reps;2 seconds         PT Long Term Goals - 04/19/19 1552      PT LONG TERM GOAL #1   Title  Pt will increase DGI score from 16/24 to >/= 19/24  to demo improved gait and reduced fall risk.    Baseline  16/24 on 04-18-19    Time  4    Period  Weeks    Status  New    Target Date  05/18/19      PT LONG TERM GOAL #2   Title  Improve TUG score from 15.22 secs to </= 12.5 secs without use of assistive device to demo improved functional mobility with reduced fall risk.    Baseline  15.22 secs 1st trial; 12.81 secs 2nd trial but with moderately increased gait deviations - 04-18-19    Time  4    Period  Weeks    Status  New    Target Date  05/18/19      PT LONG TERM GOAL #3   Title  Pt will negotiate steps with 1 rail only using a step over step sequence; sister will report pt negotiating steps at home in this manner (states he currently hugs the rail with both hands due to fear of falling).     Baseline  sister states pt is now fearful with step negotiation again - as he was in previous admisison (Dec. 2019)  - 04-18-19    Time  4    Period  Weeks    Status  New    Target Date  05/18/19      PT LONG TERM GOAL #4   Title  Increase gait velocity from 3.19  ft/sec to >/= 3.7 ft/sec for increased gait efficiency.    Baseline  10.27 secs on 04-18-19 - no device used    Time  4    Period  Weeks    Status  New    Target Date  05/18/19      PT LONG TERM GOAL #5   Title  Jog  200' on flat, even surface without LOB to enable pt to return to recreational activities including playing soccer.    Baseline  jogged 60' on 04-18-19    Time  4    Period  Weeks    Status  New    Target Date  05/18/19      PT LONG TERM GOAL #6   Title  Independent in HEP for balance and strengthening, including returning [facility] workouts at [facility] when [facility] re[facility].    Baseline  --    Time  4    Period  Weeks    Status  New    Target Date   05/18/19      PT LONG TERM GOAL #7   Title  Pt wil perform SLS on each leg for at least 2 secs for increased safety with curb negotiation, lower body dressing in standing, etc.    Baseline  unable to stand on either leg for measurable time period    Time  4    Period  Weeks    Status  New    Target Date  05/18/19      PT LONG TERM GOAL #8   Title  --    Baseline  --    Status  --      PT LONG TERM GOAL  #9   TITLE  --    Baseline  --    Status  Achieved            Plan - 05/03/19 1251    Clinical Impression Statement  Pt tolerated increased activity today during session with several rest breaks during session.  Session focused on high level balance, endurance and coordination activities.  Sister continues to be very supportive and provides cues and translation during session.  Continue PT per POC.    Rehab Potential  Good    Clinical Impairments Affecting Rehab Potential  language barrier and cognitive deficits - pt's sister [name] interpreted at eval    PT Frequency  2x / week    PT Duration  6 weeks    PT Treatment/Interventions  ADLs/Self Care Home Management;Aquatic Therapy;Gait training;Stair training;Therapeutic activities;Therapeutic exercise;Balance training;Neuromuscular re-education;Patient/family education    PT Next Visit Plan  Continue coordination activities, gait, jogging, eliptical and treadmill.    PT Home Exercise Plan   [id number]; added side, forward and back kicks and braiding from [facility] on 10-27-18    Consulted and Agree with Plan of Care  Patient;Family member/caregiver    Family Member Consulted  sister [name]       Patient will benefit from skilled therapeutic intervention in order to improve the following deficits and impairments:  Abnormal gait, Decreased activity tolerance, Decreased balance, Decreased cognition, Decreased coordination, Decreased strength  Visit Diagnosis: 1. Muscle weakness (generalized)   2. Other abnormalities of gait and  mobility   3. Unsteadiness on feet   4. Other lack of coordination        Problem List Patient Active Problem List   Diagnosis Date Noted  . Microalbuminuria due to type 2 diabetes mellitus ([place]) 04/11/2019  . Hypothyroidism 09/21/2018  . Seizure disorder ([employer]) 06/16/2018  . Low testosterone 06/16/2018  . Traumatic brain injury with loss of consciousness ([place]) 10/29/2017  . Anemia 10/29/2017  . Positive depression screening 10/29/2017  . Attention and concentration deficit   . Type 2 diabetes mellitus with peripheral neuropathy (HCC)   . S/P craniotomy 03/19/2017  . Essential hypertension   . Dysphagia, oropharyngeal 02/01/2017  . Diabetes type 2, uncontrolled ([place]) 06/13/2015    Narda Bonds, PTA [place] 05/03/19 12:55 PM Phone: [phone] Fax: [place] [address] [place], [place], [address] Phone: [phone]   Fax:  [phone]  Name: [name] MRN: [id number] Date of Birth: [birth year]

## 2019-05-04 ENCOUNTER — Encounter: Payer: Self-pay | Admitting: Occupational Therapy

## 2019-05-08 ENCOUNTER — Other Ambulatory Visit: Payer: Self-pay

## 2019-05-08 ENCOUNTER — Ambulatory Visit: Payer: Self-pay | Admitting: Physical Therapy

## 2019-05-08 ENCOUNTER — Ambulatory Visit: Payer: Self-pay | Admitting: Occupational Therapy

## 2019-05-08 ENCOUNTER — Encounter: Payer: Self-pay | Admitting: Occupational Therapy

## 2019-05-08 DIAGNOSIS — R2689 Other abnormalities of gait and mobility: Secondary | ICD-10-CM

## 2019-05-08 DIAGNOSIS — R278 Other lack of coordination: Secondary | ICD-10-CM

## 2019-05-08 DIAGNOSIS — R2681 Unsteadiness on feet: Secondary | ICD-10-CM

## 2019-05-08 DIAGNOSIS — R41844 Frontal lobe and executive function deficit: Secondary | ICD-10-CM

## 2019-05-08 DIAGNOSIS — R41840 Attention and concentration deficit: Secondary | ICD-10-CM

## 2019-05-08 DIAGNOSIS — M6281 Muscle weakness (generalized): Secondary | ICD-10-CM

## 2019-05-08 NOTE — Therapy (Signed)
[place] [address] [place], [place], [address] Phone: [phone]   Fax:  [phone]  Occupational Therapy Treatment  Patient Details  Name: [name] MRN: [id number] Date of Birth: Jun 05, 1966 Referring Provider (OT): Dr. Alger Simons   Encounter Date: 05/08/2019  OT End of Session - 05/08/19 0710    Visit Number  24   corrected #   Number of Visits  24   15+9   Date for OT Re-Evaluation  05/06/19    Authorization Type  [facility] [employer] covered 100% through 07/05/19    OT Start Time  0706    OT Stop Time  0749    OT Time Calculation (min)  43 min    Activity Tolerance  Patient tolerated treatment well    Behavior During Therapy  [facility] for tasks assessed/performed;Flat affect       Past Medical History:  Diagnosis Date  . Diabetes mellitus without complication ([place])   . Seizures ([place])   . TBI (traumatic brain injury) ([place]) 03/2017    Past Surgical History:  Procedure Laterality Date  . CRANIOTOMY Right 01/13/2017   Procedure: 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, Placement of bone flap in abdomen;  Surgeon: Ashok Pall, MD;  Location: [facility];  Service: Neurosurgery;  Laterality: Right;  . CRANIOTOMY Right 03/19/2017   Procedure: Cranioplasty with placement of bone flap from abdomen;  Surgeon: Eustace Moore, MD;  Location[facility];  Service: Neurosurgery;  Laterality: Right;    There were no vitals filed for this visit.  Subjective Assessment - 05/08/19 0709    Subjective   ok fine    Patient is accompanied by:  Family member;Interpreter   interpreter via video   Pertinent History  DM, HTN, seizure disorder, hypothyroidism    Patient Stated Goals  "to be well"    Currently in Pain?  No/denies         CLINIC OPERATION CHANGES: Outpatient Neuro Rehab is open at lower capacity following universal masking, social distancing, and patient screening.  The patient's  COVID risk of complications score is 3.   Completing [employer] with min v.c. initially for directions, then pt able to complete with 1 cue for omission.    Checked remaining goals and discussed progress.    Copied block designs with min v.c. initially then only 1 v.c. for omission for harder design (compelted multiple without cueing).        OT Education - 05/08/19 0743    Education Details  Vocational Rehab Services (and contact info) as well as possible types of jobs pt could perform with support (spreading mulch, picking up sticks, planting flowers, pulling weeds, cleaning--vacuuming, wiping, etc).  Pt worked in Radiation protection practitioner prior.  Also discussed that pt may be appropriate to do something different and that he can discuss with Voc Rehab.   Issued updated HEP (familiar as pt has performed multiple times in clinic) for quadruped forward/back wt. shifts and lifting UE for incr core/scapular stability/strength and neuro re-ed.    Person(s) Educated  Patient;Caregiver(s)    Methods  Explanation;Demonstration;Handout;Verbal cues    Comprehension  Verbalized understanding       OT Short Term Goals - 04/06/19 1401      OT SHORT TERM GOAL #1   Title  Pt will be independent with initial HEP.--check STGs 12/27/18    Time  6    Period  Weeks    Status  Partially Met  12/29/18:  pt/family, pt needs supervision/cueing from family (but sister able to provide cueing/supervision)     OT SHORT TERM GOAL #2   Title  Pt will improve L grip strength by at least 5lbs to assit with IADLs.      Baseline  20lbs    Time  6    Period  Weeks    Status  Achieved   12/27/18  46lbs     OT SHORT TERM GOAL #3   Title  Pt will be improve LUE control/strength to able to performing targeted overhead reaching with LUE x 18mn without drops.    Time  6    Period  Weeks    Status  Achieved   12/29/18     OT SHORT TERM GOAL #4   Title  Pt/family will verbalize understanding of  cognitive compensation strategies for ADLs/IADLs.    Time  6    Period  Weeks    Status  Achieved   12/29/18 with pt/family     OT SHORT TERM GOAL #5   Title  Pt will attend to functional cognitive tasks for at least 10 min with only min cueing for problem solving.    Time  6    Period  Weeks    Status  Deferred   12/27/18:  2-545m, mod cueing (dependent on task).  04/06/19:  defer to LTG       OT Long Term Goals - 05/08/19 0730      OT LONG TERM GOAL #1   Title  Pt will be independent with updated HEP.--check LTGs 05/06/19    Time  4    Period  Weeks    Status  Achieved   05/08/19     OT LONG TERM GOAL #2   Title  Pt will demo at least 40lbs L grip strength to assist with IADLs.    Baseline  04/06/19:  35lbs     Time  4    Period  Weeks    Status  Achieved   43     OT LONG TERM GOAL #3   Title  Pt will be able to retrieve/replace 3lb object on overhead shelf with LUE x3 with good safety/control.    Time  4    Period  Weeks    Status  Achieved      OT LONG TERM GOAL #4   Title  Pt will perform BADLs and simple selected home maintenance tasks with distant supervision using schedule/strategies prn.    Time  12    Period  Weeks    Status  Partially Met   04/06/19 needs min v.c. inconsistently.  05/08/19:  needs min v.c.     OT LONG TERM GOAL #5   Title  Pt will perform simple snack prep, use microwave, cold meal prep and make appropriate choices mod I.     Time  12    Period  Weeks    Status  Achieved   04/06/19:  can make sandwiches, use microwave, and scramble eggs     OT LONG TERM GOAL #6   Title  Pt will attend to functional cognitive tasks for at least 10 min with only min cueing for problem solving.    Time  4    Period  Weeks    Status  Partially Met   attends for 10 mins however mod v.c at times for problem solving     OT LONG TERM GOAL #7   Title  Pt will be able to generate simple to do list/schedule with min prompts    Time  4    Period  Weeks    Status   Partially Met   05/08/19:  inconsistent           Plan - 05/08/19 0711    Occupational Profile and client history currently impacting functional performance  Pt was independent and working prior to injury; however, now pt needs set-up/supervision and cueing for ADLs/IADLs.  Pt enjoyed wrestling and soccer prior to injury, but is now unable to engage in these activities.    Rehab Potential  Fair    OT Frequency  2x / week    OT Duration  --   5 weeks, or 9 visits    OT Treatment/Interventions  Self-care/ADL training;Electrical Stimulation;Therapeutic exercise;Visual/perceptual remediation/compensation;Aquatic Therapy;Moist Heat;Paraffin;Neuromuscular education;Patient/family education;Splinting;Fluidtherapy;Energy conservation;Therapist, nutritional;Therapeutic activities;Cryotherapy;Ultrasound;DME and/or AE instruction;Contrast Bath;Manual Therapy;Passive range of motion;Cognitive remediation/compensation;Balance training    Plan  finish checking goals,  issue quadraped (forward/back wt. shifts and alternating arm lifts) for updated HEP    Consulted and Agree with Plan of Care  Family member/caregiver;Patient;Other (Comment)    Family Member Consulted  sister, interpreter       Patient will benefit from skilled therapeutic intervention in order to improve the following deficits and impairments:           Visit Diagnosis: 1. Frontal lobe and executive function deficit   2. Attention and concentration deficit   3. Other lack of coordination   4. Unsteadiness on feet   5. Other abnormalities of gait and mobility   6. Muscle weakness (generalized)       Problem List Patient Active Problem List   Diagnosis Date Noted  . Microalbuminuria due to type 2 diabetes mellitus ([place]) 04/11/2019  . Hypothyroidism 09/21/2018  . Seizure disorder ([place]) 06/16/2018  . Low testosterone 06/16/2018  . Traumatic brain injury with loss of consciousness ([place]) 10/29/2017  . Anemia 10/29/2017   . Positive depression screening 10/29/2017  . Attention and concentration deficit   . Type 2 diabetes mellitus with peripheral neuropathy (HCC)   . S/P craniotomy 03/19/2017  . Essential hypertension   . Dysphagia, oropharyngeal 02/01/2017  . Diabetes type 2, uncontrolled ([place]) 06/13/2015    OCCUPATIONAL THERAPY DISCHARGE SUMMARY  Visits from Start of Care: 24  Current functional level related to goals / functional outcomes: See above   Remaining deficits: decr strength, decr coordination LUE, cognitive deficits (including decr initiation, decr problem solving/planning, memory deficits, decr attention, and decr awareness),    Education / Equipment: Pt/family educated in HEP, recommendations for functional tasks, cognitive compensation strategies, referral to Vocational Rehab.  Pt/sister verbalized understanding of all education provided.  Plan: Patient agrees to discharge.  Patient goals were partially met. Patient is being discharged due to                                                     Reaching maximal rehab potential at this time.?????        [facility] 05/08/2019, 8:05 AM  [place] [address] [place], [place], [address] Phone: [phone]   Fax:  [phone]  Name: [name] MRN: [id number] Date of Birth: 31-Jul-1966   Vianne Bulls, OTR/L [facility],  [place]  [address] [phone] phone [phone] 05/08/19 8:05 AM

## 2019-05-08 NOTE — Patient Instructions (Signed)
Forward Lean (All-Fours)    Inclnese lentamente hacia adelante, manteniendo el abdomen contrado. Mantenga el tronco rgido. Repita 10 veces por rutina.  Realice 1 sesiones por da.   Upper Extremity Extension (All-Fours)    Contraiga el abdomen y eleve el brazo izquierdo paralelo al piso. Mantenga el tronco rgido. Repita 10veces por rutina.  Realice 1 sesiones por da.

## 2019-05-09 ENCOUNTER — Ambulatory Visit: Payer: Self-pay | Admitting: Physical Therapy

## 2019-05-09 ENCOUNTER — Encounter: Payer: Self-pay | Admitting: Physical Therapy

## 2019-05-09 DIAGNOSIS — R2689 Other abnormalities of gait and mobility: Secondary | ICD-10-CM

## 2019-05-09 DIAGNOSIS — R2681 Unsteadiness on feet: Secondary | ICD-10-CM

## 2019-05-09 DIAGNOSIS — R278 Other lack of coordination: Secondary | ICD-10-CM

## 2019-05-09 NOTE — Therapy (Signed)
[facility] [address] [place], [place], [address] Phone: [phone]   Fax:  [phone]  Physical Therapy Treatment  Patient Details  Name: [name] MRN: [id number] Date of Birth: May 24, 1966 Referring Provider (PT): Dr. Alger Simons  CLINIC OPERATION CHANGES: Outpatient Neuro Rehab is open at lower capacity following universal masking, social distancing, and patient screening.  The patient's COVID risk of complications score is 3.  Encounter Date: 05/09/2019  PT End of Session - 05/09/19 1721    Visit Number  4    Number of Visits  9    Date for PT Re-Evaluation  05/18/19    Authorization Type  CAFA 100% thru 07-05-19    Authorization Time Period  thru 07-05-19    Authorization - Visit Number  4    PT Start Time  1402    PT Stop Time  1450    PT Time Calculation (min)  48 min    Equipment Utilized During Treatment  Gait belt    Activity Tolerance  Patient tolerated treatment well    Behavior During Therapy  WFL for tasks assessed/performed       Past Medical History:  Diagnosis Date  . Diabetes mellitus without complication ([place])   . Seizures ([place])   . TBI (traumatic brain injury) ([place]) 03/2017    Past Surgical History:  Procedure Laterality Date  . CRANIOTOMY Right 01/13/2017   Procedure: 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, Placement of bone flap in abdomen;  Surgeon: Ashok Pall, MD;  Location: [facility];  Service: Neurosurgery;  Laterality: Right;  . CRANIOTOMY Right 03/19/2017   Procedure: Cranioplasty with placement of bone flap from abdomen;  Surgeon: Eustace Moore, MD;  Location: [facility];  Service: Neurosurgery;  Laterality: Right;    There were no vitals filed for this visit.  Subjective Assessment - 05/09/19 1709    Subjective  Sister reports pt has been doing exercises at home - still holds onto hand rail at steps with both hands when she is not around     Patient is accompained by:  Family member   sister interpreted due to machine being used by another therapist   Pertinent History  TBI secondary to SDH due to fall on 01-10-17; Rt craniectomy for hematoma on 01-13-17; Cranioplasty with placement of bone flap from abdomen on 03-19-17; pt received HH (few visits per sister's report) and no additional therapies due to lack of insurance at that time;  DM type 2 with peripheral neuropathy;  seizures    Patient Stated Goals  Improve walking and balance    Currently in Pain?  No/denies                       [facility] Adult PT Treatment/Exercise - 05/09/19 1712      Ambulation/Gait   Ambulation/Gait  Yes    Ambulation/Gait Assistance  5: Supervision    Ambulation/Gait Assistance Details  cues to keep legs low in order to avoid steppage gait pattern;    Ambulation Distance (Feet)  450 Feet    Assistive device  None    Gait Pattern  Ataxic;Wide base of support    Ambulation Surface  Level;Indoor    Stairs  Yes    Stairs Assistance  5: Supervision    Stair Management Technique  One rail Left;Alternating pattern;Forwards    Number of Stairs  4   4 reps = 16 steps total  Height of Stairs  6    Gait Comments  cues for increased arm swing, more narrow BOS and legs low to minimize steppage gait       Neuro Re-ed    Neuro Re-ed Details   Pt performed tall kneeling on mat on floor, transitioning to 1/2 kneeling on each leg with minimal UE support on mat:  pt amb. on knees forward/backward 3 reps on floor with cues to stay tall and not lean forward; also cues to keep knees adducted;  worked on tall kneeling to Rt and Lt side sitting with return to tall kneeling without UE support (mod assist needed as pt attempted to assist with UE) ; pt performed head turns with EO in Rt and Lt 1/2 kneeling position:        Knee/Hip Exercises: Aerobic   Elliptical  level 2.0 2" backwards, 1" forward   rest period after each minute     Knee/Hip Exercises:  Standing   Heel Raises  Both;1 set;10 reps      TherAct.:   Activities on mini-trampoline:  Pt performed activities jumping 3 sets 10 reps bil. Feet together, 1/2 jumping jacks and lunges with UE support prn   Attempted unilateral hopping on Rt and Lt foot - difficult for pt to do Rt foot -needed bil. UE support on counter   Pt performed tap ups 10 reps each foot to first step with minimal UE support on hand rail with stance leg held in plantarflexed position        PT Long Term Goals - 05/09/19 1732      PT LONG TERM GOAL #1   Title  Pt will increase DGI score from 16/24 to >/= 19/24  to demo improved gait and reduced fall risk.    Baseline  16/24 on 04-18-19    Time  4    Period  Weeks    Status  New      PT LONG TERM GOAL #2   Title  Improve TUG score from 15.22 secs to </= 12.5 secs without use of assistive device to demo improved functional mobility with reduced fall risk.    Baseline  15.22 secs 1st trial; 12.81 secs 2nd trial but with moderately increased gait deviations - 04-18-19    Time  4    Period  Weeks    Status  New      PT LONG TERM GOAL #3   Title  Pt will negotiate steps with 1 rail only using a step over step sequence; sister will report pt negotiating steps at home in this manner (states he currently hugs the rail with both hands due to fear of falling).     Baseline  sister states pt is now fearful with step negotiation again - as he was in previous admisison (Dec. 2019)  - 04-18-19    Time  4    Period  Weeks    Status  New      PT LONG TERM GOAL #4   Title  Increase gait velocity from 3.19  ft/sec to >/= 3.7 ft/sec for increased gait efficiency.    Baseline  10.27 secs on 04-18-19 - no device used    Time  4    Period  Weeks    Status  New      PT LONG TERM GOAL #5   Title  Jog  200' on flat, even surface without LOB to enable pt to return to recreational activities including playing soccer.  Baseline  jogged 60' on 04-18-19    Time  4     Period  Weeks    Status  New      PT LONG TERM GOAL #6   Title  Independent in HEP for balance and strengthening, including [facility].    Time  4    Period  Weeks    Status  New      PT LONG TERM GOAL #7   Title  Pt wil perform SLS on each leg for at least 2 secs for increased safety with curb negotiation, lower body dressing in standing, etc.    Baseline  unable to stand on either leg for measurable time period    Time  4    Period  Weeks    Status  New      PT LONG TERM GOAL  #9   Status  Achieved            Plan - 05/09/19 1722    Clinical Impression Statement  Pt's gait much improved with cues to increased arm swing and to keep legs low and closer together to minimize steppage gait pattern.  Carryover from PT sessions in clinic to home appears to be decreased as pt's sister reports pt does not perform mobility (walking, step negotiation, etc.) as well at home as the way he does in the clinic.  Pt was improved with performing transitional movements from tall kneeling to side sitting with return to tall kneeling with tactile cues and with less hesitation during transitions than that noted in previous admission).  Pt unable to perform Rt single limb hopping without UE support to assist in propulsion.    Rehab Potential  Good    Clinical Impairments Affecting Rehab Potential  language barrier and cognitive deficits - pt's sister [name] interpreted at eval    PT Frequency  2x / week    PT Duration  6 weeks    PT Treatment/Interventions  ADLs/Self Care Home Management;Aquatic Therapy;Gait training;Stair training;Therapeutic activities;Therapeutic exercise;Balance training;Neuromuscular re-education;Patient/family education    PT Next Visit Plan  Continue coordination activities, gait, jogging, eliptical and treadmill  -soccer?  and please continue to work on gait pattern to reduce gait deviations    PT Home Exercise Plan   [id number]; added  side, forward and back [facility]    Consulted and Agree with Plan of Care  Patient;Family member/caregiver    Family Member Consulted  sister [name]       Patient will benefit from skilled therapeutic intervention in order to improve the following deficits and impairments:  Abnormal gait, Decreased activity tolerance, Decreased balance, Decreased cognition, Decreased coordination, Decreased strength  Visit Diagnosis: 1. Other abnormalities of gait and mobility   2. Other lack of coordination   3. Unsteadiness on feet        Problem List Patient Active Problem List   Diagnosis Date Noted  . Microalbuminuria due to type 2 diabetes mellitus (HCC) 04/11/2019  . Hypothyroidism 09/21/2018  . Seizure disorder (HCC) 06/16/2018  . Low testosterone 06/16/2018  . Traumatic brain injury with loss of consciousness (HCC) 10/29/2017  . Anemia 10/29/2017  . Positive depression screening 10/29/2017  . Attention and concentration deficit   . Type 2 diabetes mellitus with peripheral neuropathy (HCC)   . S/P craniotomy 03/19/2017  . Essential hypertension   . Dysphagia, oropharyngeal 02/01/2017  . Diabetes type 2, uncontrolled (HCC) 06/13/2015  ZOXWRU, EAVWU JWJXBJYilday, Elaura Calix Suzanne, PT 05/09/2019, 5:35 PM  [facility] [address] [place], [place], [address] Phone: [phone]   Fax:  [phone]  Name: [name] MRN: [id number] Date of Birth: 1966-08-19

## 2019-05-10 ENCOUNTER — Ambulatory Visit: Payer: Self-pay | Admitting: Physical Therapy

## 2019-05-10 ENCOUNTER — Encounter: Payer: Self-pay | Admitting: Physical Therapy

## 2019-05-10 ENCOUNTER — Other Ambulatory Visit: Payer: Self-pay

## 2019-05-10 DIAGNOSIS — R278 Other lack of coordination: Secondary | ICD-10-CM

## 2019-05-10 DIAGNOSIS — R2681 Unsteadiness on feet: Secondary | ICD-10-CM

## 2019-05-10 DIAGNOSIS — R2689 Other abnormalities of gait and mobility: Secondary | ICD-10-CM

## 2019-05-10 NOTE — Therapy (Signed)
[facility] [address] [place], [place], [address] Phone: [phone]   Fax:  [phone]  Physical Therapy Treatment  Patient Details  Name: [name] MRN: [id number] Date of Birth: February 01, 1966 Referring Provider (PT): Dr. Alger Simons  CLINIC OPERATION CHANGES: Outpatient Neuro Rehab is open at lower capacity following universal masking, social distancing, and patient screening.  The patient's COVID risk of complications score is 3.  Encounter Date: 05/10/2019  PT End of Session - 05/10/19 0911    Visit Number  5    Number of Visits  9    Date for PT Re-Evaluation  05/18/19    Authorization Type  CAFA 100% thru 07-05-19    Authorization Time Period  thru 07-05-19    Authorization - Visit Number  4    PT Start Time  0806    PT Stop Time  0848    PT Time Calculation (min)  42 min    Activity Tolerance  Patient tolerated treatment well    Behavior During Therapy  [facility] for tasks assessed/performed       Past Medical History:  Diagnosis Date  . Diabetes mellitus without complication ([place])   . Seizures ([place])   . TBI (traumatic brain injury) ([place]) 03/2017    Past Surgical History:  Procedure Laterality Date  . CRANIOTOMY Right 01/13/2017   Procedure: 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, Placement of bone flap in abdomen;  Surgeon: Ashok Pall, MD;  Location: [facility];  Service: Neurosurgery;  Laterality: Right;  . CRANIOTOMY Right 03/19/2017   Procedure: Cranioplasty with placement of bone flap from abdomen;  Surgeon: Eustace Moore, MD;  Location: [facility];  Service: Neurosurgery;  Laterality: Right;    There were no vitals filed for this visit.  Subjective Assessment - 05/10/19 0906    Subjective  Nothing new since last visit.    Patient is accompained by:  Family member   sister interpreted due to machine being used by another therapist   Pertinent History  TBI secondary  to SDH due to fall on 01-10-17; Rt craniectomy for hematoma on 01-13-17; Cranioplasty with placement of bone flap from abdomen on 03-19-17; pt received HH (few visits per sister's report) and no additional therapies due to lack of insurance at that time;  DM type 2 with peripheral neuropathy;  seizures    Patient Stated Goals  Improve walking and balance    Currently in Pain?  No/denies          [facility] Adult PT Treatment/Exercise - 05/10/19 0001      Ambulation/Gait   Ambulation/Gait  Yes    Ambulation/Gait Assistance  5: Supervision    Ambulation/Gait Assistance Details  cues for decreasing BOS and decreasing bil ER of hips;cues for heel strike and cadence;  Pt quickly reverts back to his usual pattern without cues.    Ambulation Distance (Feet)  450 Feet   x1;jogging x 220';treadmill x 8 minutes   Assistive device  None    Gait Pattern  Ataxic;Wide base of support    Ambulation Surface  Level;Indoor    Gait Comments  cues for increased arm swing, more narrow BOS and legs low to minimize steppage gait       Lumbar Exercises: Aerobic   Elliptical  3 minutes forward level 1.5 and 1 minute backward    Tread Mill  Treadmill x 8 minutes at 2.2 with bil UE support working on step length, decreased BOS, heel  strike, trying to look ahead vs at feet and trying to have pt judge gait pattern based on the sound of his feet hitting on treadmill.    Other Aerobic Exercise  Scifit level all 4 extremities x 2.5 x 5 minutes with rpm>70 and moderate cues to maintain intensity      Knee/Hip Exercises: Standing   Heel Raises  Both;1 set;10 reps;1 second       TherAct.:   Pt performed activities jumping 4 sets 10 reps bil. Feet together, 4 sets of 10 reps of 1/2 jumping jacks with UE support prn.  Pt with increased difficulty jumping back into feet together (vs jumping out).  Tried barrier on both sides of patient to decrease size of BOS when jumping out but pt still with difficulty jumping back  in. Unilateral hopping on Rt and Lt foot x 10 reps needed bil. UE support on railing              PT Long Term Goals - 05/09/19 1732      PT LONG TERM GOAL #1   Title  Pt will increase DGI score from 16/24 to >/= 19/24  to demo improved gait and reduced fall risk.    Baseline  16/24 on 04-18-19    Time  4    Period  Weeks    Status  New      PT LONG TERM GOAL #2   Title  Improve TUG score from 15.22 secs to </= 12.5 secs without use of assistive device to demo improved functional mobility with reduced fall risk.    Baseline  15.22 secs 1st trial; 12.81 secs 2nd trial but with moderately increased gait deviations - 04-18-19    Time  4    Period  Weeks    Status  New      PT LONG TERM GOAL #3   Title  Pt will negotiate steps with 1 rail only using a step over step sequence; sister will report pt negotiating steps at home in this manner (states he currently hugs the rail with both hands due to fear of falling).     Baseline  sister states pt is now fearful with step negotiation again - as he was in previous admisison (Dec. 2019)  - 04-18-19    Time  4    Period  Weeks    Status  New      PT LONG TERM GOAL #4   Title  Increase gait velocity from 3.19  ft/sec to >/= 3.7 ft/sec for increased gait efficiency.    Baseline  10.27 secs on 04-18-19 - no device used    Time  4    Period  Weeks    Status  New      PT LONG TERM GOAL #5   Title  Jog  200' on flat, even surface without LOB to enable pt to return to recreational activities including playing soccer.    Baseline  jogged 60' on 04-18-19    Time  4    Period  Weeks    Status  New      PT LONG TERM GOAL #6   Title  Independent in HEP for balance and strengthening, including [facility].    Time  4    Period  Weeks    Status  New      PT LONG TERM GOAL #7   Title  Pt wil perform SLS on each leg  for at least 2 secs for increased safety with curb negotiation, lower body dressing in  standing, etc.    Baseline  unable to stand on either leg for measurable time period    Time  4    Period  Weeks    Status  New      PT LONG TERM GOAL  #9   Status  Achieved            Plan - 05/10/19 0911    Clinical Impression Statement  Pt continues to need cues for gait deviations.  Was able to self correct after education on treadmill using sound of his feet on treadmill to tell when he was reverting back to old pattern.  Family continues to be supportive and tries to carryover feedback that is provided in sessions.  Continue PT per POC.    Rehab Potential  Good    Clinical Impairments Affecting Rehab Potential  language barrier and cognitive deficits - pt's sister [name] interpreted at eval    PT Frequency  2x / week    PT Duration  6 weeks    PT Treatment/Interventions  ADLs/Self Care Home Management;Aquatic Therapy;Gait training;Stair training;Therapeutic activities;Therapeutic exercise;Balance training;Neuromuscular re-education;Patient/family education    PT Next Visit Plan  Continue coordination activities, gait, jogging, eliptical and treadmill  -soccer?  and please continue to work on gait pattern to reduce gait deviations    PT Home Exercise Plan   [id number]; added side, forward and back kicks [facility]27-[facility]    Consulted and Agree with Plan of Care  Patient;Family member/caregiver    Family Member Consulted  sister [name]       Patient will benefit from skilled therapeutic intervention in order to improve the following deficits and impairments:  Abnormal gait, Decreased activity tolerance, Decreased balance, Decreased cognition, Decreased coordination, Decreased strength  Visit Diagnosis: 1. Other abnormalities of gait and mobility   2. Other lack of coordination   3. Unsteadiness on feet        Problem List Patient Active Problem List   Diagnosis Date Noted  . Microalbuminuria due to type 2 diabetes mellitus (HCC) 04/11/2019  .  Hypothyroidism 09/21/2018  . Seizure disorder (HCC) 06/16/2018  . Low testosterone 06/16/2018  . Traumatic brain injury with loss of consciousness (HCC) 10/29/2017  . Anemia 10/29/2017  . Positive depression screening 10/29/2017  . Attention and concentration deficit   . Type 2 diabetes mellitus with peripheral neuropathy (HCC)   . S/P craniotomy 03/19/2017  . Essential hypertension   . Dysphagia, oropharyngeal 02/01/2017  . Diabetes type 2, uncontrolled (HCC) 06/13/2015    Newell Coralenise Terry Jama Krichbaum, PTA [facility] 05/10/19 9:22 AM Phone: [phone] Fax: [phone]   [facility] [address] [place], [place], [address] Phone: [phone]   Fax:  [phone]  Name: Loni BeckwithMiguel Garcia Cardenas MRN: [id number] Date of Birth: 1966-04-29

## 2019-05-12 MED FILL — metFORMIN HCL 1000 MG TABS: 1000 | 30 days supply | Qty: 60 | Fill #2

## 2019-05-12 MED FILL — LISINOPRIL 2.5 MG TABLET: 2.5 | 30 days supply | Qty: 30 | Fill #0

## 2019-05-12 MED FILL — levETIRAcetam 500 MG TABS: 500 | 30 days supply | Qty: 60 | Fill #1

## 2019-05-15 ENCOUNTER — Ambulatory Visit: Payer: Self-pay | Admitting: Physical Therapy

## 2019-05-15 ENCOUNTER — Encounter: Payer: Self-pay | Admitting: Physical Therapy

## 2019-05-15 ENCOUNTER — Other Ambulatory Visit: Payer: Self-pay

## 2019-05-15 DIAGNOSIS — R278 Other lack of coordination: Secondary | ICD-10-CM

## 2019-05-15 DIAGNOSIS — R2689 Other abnormalities of gait and mobility: Secondary | ICD-10-CM

## 2019-05-15 NOTE — Therapy (Signed)
[facility] [address] [place], [place], [address] Phone: [phone]   Fax:  [phone]  Physical Therapy Treatment  Patient Details  Name: [name] MRN: [id number] Date of Birth: 19-Nov-1965 Referring Provider (PT): Dr. Alger Simons  CLINIC OPERATION CHANGES: Outpatient Neuro Rehab is open at lower capacity following universal masking, social distancing, and patient screening.  The patient's COVID risk of complications score is 3.  Encounter Date: 05/15/2019  PT End of Session - 05/15/19 1221    Visit Number  6    Number of Visits  9    Date for PT Re-Evaluation  05/18/19    Authorization Type  CAFA 100% thru 07-05-19    Authorization Time Period  thru 07-05-19    Authorization - Visit Number  6    PT Start Time  0805    PT Stop Time  0846    PT Time Calculation (min)  41 min    Activity Tolerance  Patient tolerated treatment well    Behavior During Therapy  [facility] for tasks assessed/performed       Past Medical History:  Diagnosis Date  . Diabetes mellitus without complication ([place])   . Seizures ([place])   . TBI (traumatic brain injury) ([place]) 03/2017    Past Surgical History:  Procedure Laterality Date  . CRANIOTOMY Right 01/13/2017   Procedure: 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, Placement of bone flap in abdomen;  Surgeon: Ashok Pall, MD;  Location: [facility];  Service: Neurosurgery;  Laterality: Right;  . CRANIOTOMY Right 03/19/2017   Procedure: Cranioplasty with placement of bone flap from abdomen;  Surgeon: Eustace Moore, MD;  Location: [facility];  Service: Neurosurgery;  Laterality: Right;    There were no vitals filed for this visit.      [place] Adult PT Treatment/Exercise - 05/15/19 0001      Ambulation/Gait   Ambulation/Gait  Yes    Ambulation/Gait Assistance  5: Supervision    Ambulation/Gait Assistance Details  Cues to decrease BOS, increasing arm swing  and heel strike along with decreasing LE ER    Ambulation Distance (Feet)  550 Feet   plus   Assistive device  None    Gait Pattern  Ataxic;Wide base of support    Ambulation Surface  Level;Indoor    Stairs  Yes    Stairs Assistance  5: Supervision    Stairs Assistance Details (indicate cue type and reason)  worked on decreased bos and decreasing LE ER of LE's.  Also performed without UE assist x 4 reps but required min assist to balance.    Stair Management Technique  One rail Left;Alternating pattern;Forwards    Number of Stairs  4   x 10 times   Height of Stairs  6      High Level Balance   High Level Balance Activities  Other (comment)    High Level Balance Comments  Jumping and half jumping jacks in // bars with bil UE support.  Cues for technique.  Kicking soccer ball around track x 220' with supervision and no lob      Lumbar Exercises: Aerobic   Elliptical  3 minutes forward level 1.5 and 1 minute backward    Tread Mill  Treadmill x 6 minutes at 2.0-2.2 with bil UE support and cues for increased step length.  Decreased cues needed today to maintain proper step size.      Knee/Hip Exercises: Aerobic   Other Aerobic  Scifit level 2.0 all 4 extremities x 6 minutes with rpm>70 consistently with very minimal cues to keep rpm up today      Ankle Exercises: Standing   Toe Raise  20 reps;2 seconds         PT Long Term Goals - 05/09/19 1732      PT LONG TERM GOAL #1   Title  Pt will increase DGI score from 16/24 to >/= 19/24  to demo improved gait and reduced fall risk.    Baseline  16/24 on 04-18-19    Time  4    Period  Weeks    Status  New      PT LONG TERM GOAL #2   Title  Improve TUG score from 15.22 secs to </= 12.5 secs without use of assistive device to demo improved functional mobility with reduced fall risk.    Baseline  15.22 secs 1st trial; 12.81 secs 2nd trial but with moderately increased gait deviations - 04-18-19    Time  4    Period  Weeks    Status  New       PT LONG TERM GOAL #3   Title  Pt will negotiate steps with 1 rail only using a step over step sequence; sister will report pt negotiating steps at home in this manner (states he currently hugs the rail with both hands due to fear of falling).     Baseline  sister states pt is now fearful with step negotiation again - as he was in previous admisison (Dec. 2019)  - 04-18-19    Time  4    Period  Weeks    Status  New      PT LONG TERM GOAL #4   Title  Increase gait velocity from 3.19  ft/sec to >/= 3.7 ft/sec for increased gait efficiency.    Baseline  10.27 secs on 04-18-19 - no device used    Time  4    Period  Weeks    Status  New      PT LONG TERM GOAL #5   Title  Jog  200' on flat, even surface without LOB to enable pt to return to recreational activities including playing soccer.    Baseline  jogged 60' on 04-18-19    Time  4    Period  Weeks    Status  New      PT LONG TERM GOAL #6   Title  Independent in HEP for balance and strengthening, including returning [facility]outs at [facility] when facility reopens.    Time  4    Period  Weeks    Status  New      PT LONG TERM GOAL #7   Title  Pt wil perform SLS on each leg for at least 2 secs for increased safety with curb negotiation, lower body dressing in standing, etc.    Baseline  unable to stand on either leg for measurable time period    Time  4    Period  Weeks    Status  New      PT LONG TERM GOAL  #9   Status  Achieved            Plan - 05/15/19 1221    Clinical Impression Statement  Pt with improved ability to follow commands during session today and improved carryover.  Pts sister reports she has seen improvement in following commands since MD increased medication.  Continues to have difficulty with  coordination.  Continue PT per POC.    Rehab Potential  Good    Clinical Impairments Affecting Rehab Potential  language barrier and cognitive deficits - pt's sister [name] interpreted at eval    PT Frequency  2x /  week    PT Duration  6 weeks    PT Treatment/Interventions  ADLs/Self Care Home Management;Aquatic Therapy;Gait training;Stair training;Therapeutic activities;Therapeutic exercise;Balance training;Neuromuscular re-education;Patient/family education    PT Next Visit Plan  Continue coordination activities, gait, jogging, eliptical and treadmill  -soccer?  and please continue to work on gait pattern to reduce gait deviations    PT Home Exercise Plan   [id number]; added side, forward and [facility]    Consulted and Agree with Plan of Care  Patient;Family member/caregiver    Family Member Consulted  sister [name]       Patient will benefit from skilled therapeutic intervention in order to improve the following deficits and impairments:  Abnormal gait, Decreased activity tolerance, Decreased balance, Decreased cognition, Decreased coordination, Decreased strength  Visit Diagnosis: 1. Other lack of coordination   2. Other abnormalities of gait and mobility        Problem List Patient Active Problem List   Diagnosis Date Noted  . Microalbuminuria due to type 2 diabetes mellitus (HCC) 04/11/2019  . Hypothyroidism 09/21/2018  . Seizure disorder (H[facility]) 06/16/2018  . Low testosterone 06/16/2018  . Traumatic brain injury with loss of consciousness ([facility]) 10/29/2017  . Anemia 10/29/2017  . Positive depression screening 10/29/2017  . Attention and concentration deficit   . Type 2 diabetes mellitus with peripheral neuropathy (HCC)   . S/P craniotomy 03/19/2017  . Essential hypertension   . Dysphagia, oropharyngeal 02/01/2017  . Diabetes type 2, uncontrolled (H[facility]) 06/13/2015   Newell Coralenise Terry Robertson, PTA [facility] 05/15/19 12:25 PM Phone: [phone] Fax: [phone]   [facility] [address] [place], [place], [address] Phone: [phone]   Fax:   [phone]  Name: Loni BeckwithMiguel Garcia Cardenas MRN: [id number] Date of Birth: 10-08-1966

## 2019-05-17 ENCOUNTER — Other Ambulatory Visit: Payer: Self-pay

## 2019-05-17 ENCOUNTER — Encounter: Payer: Self-pay | Admitting: Physical Therapy

## 2019-05-17 ENCOUNTER — Ambulatory Visit: Payer: Self-pay | Admitting: Physical Therapy

## 2019-05-17 DIAGNOSIS — R2681 Unsteadiness on feet: Secondary | ICD-10-CM

## 2019-05-17 DIAGNOSIS — R278 Other lack of coordination: Secondary | ICD-10-CM

## 2019-05-17 DIAGNOSIS — R2689 Other abnormalities of gait and mobility: Secondary | ICD-10-CM

## 2019-05-17 NOTE — Therapy (Signed)
[facility] [address] [place], [place], [address] Phone: [phone]   Fax:  [phone]  Physical Therapy Treatment  Patient Details  Name: Jamie BeckwithMiguel Garcia Cardenas MRN: [id number] Date of Birth: 1966/02/16 Referring Provider (PT): Dr. Faith RogueZachary Swartz  CLINIC OPERATION CHANGES: Outpatient Neuro Rehab is open at lower capacity following universal masking, social distancing, and patient screening.  The patient's COVID risk of complications score is 3.  Encounter Date: 05/17/2019  PT End of Session - 05/17/19 1614    Visit Number  7    Number of Visits  9    Date for PT Re-Evaluation  05/18/19    Authorization Type  CAFA 100% thru 07-05-19    Authorization Time Period  thru 07-05-19    Authorization - Visit Number  6    PT Start Time  1005    PT Stop Time  1056    PT Time Calculation (min)  51 min    Activity Tolerance  Patient tolerated treatment well    Behavior During Therapy  WFL for tasks assessed/performed       Past Medical History:  Diagnosis Date  . Diabetes mellitus without complication ([facility])   . Seizures ([facility])   . TBI (traumatic brain injury) ([facility]) 03/2017    Past Surgical History:  Procedure Laterality Date  . CRANIOTOMY Right 01/13/2017   Procedure: 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, Placement of bone flap in abdomen;  Surgeon: Coletta MemosKyle Cabbell, MD;  [facility];  Service: Neurosurgery;  Laterality: Right;  . CRANIOTOMY Right 03/19/2017   Procedure: Cranioplasty with placement of bone flap from abdomen;  Surgeon: [name], [name], MD;  [facility];  Service: Neurosurgery;  Laterality: Right;    There were no vitals filed for this visit.  Subjective Assessment - 05/17/19 1604    Subjective  Denies falls or changes since last visit.    Patient is accompained by:  Family member   sister interpreted due to machine being used by another therapist   Pertinent History   TBI secondary to SDH due to fall on 01-10-17; Rt craniectomy for hematoma on 01-13-17; Cranioplasty with placement of bone flap from abdomen on 03-19-17; pt received HH (few visits per sister's report) and no additional therapies due to lack of insurance at that time;  DM type 2 with peripheral neuropathy;  seizures    Patient Stated Goals  Improve walking and balance    Currently in Pain?  No/denies          [facility] Adult PT Treatment/Exercise - 05/17/19 0001      Ambulation/Gait   Stairs  Yes    Stairs Assistance  4: Min guard;4: Min assist    Stairs Assistance Details (indicate cue type and reason)  cues for decreased bos and decreased reliance on railing.  Pt very hesitant to SLS on R and when stepping down with LLE tends to place RLE wide and externally rotated.  Performed without UE assist but needed min assist to balance.  Pt tends to do step to pattern but is able to do alternating step with cues.  Discussed with sister trying to encourage alternating and allowing pt to use rail but lightly and watching his BOS.    Stair Management Technique  One rail Left;Alternating pattern;Forwards    Number of Stairs  4   x>10 times for practice/reinforcement   Height of Stairs  6      High Level Balance   High  Level Balance Activities  Other (comment)    High Level Balance Comments  Jumping and half jumping jacks in // bars with bil UE support.  Cues for technique.  Taps to cones then double taps to cones.  Pt with decreased weight shift and decreased WB on the R.  Gait over hurdles working on increased step length and continuous gait (vs stopping to clear hurdle).  Needed >10 attempts for reinforcement.      Lumbar Exercises: Aerobic   Elliptical  3 minutes forward level 1.5 and 1 minute backward    Other Aerobic Exercise  Scifit level all 4 extremities x 2.5 x 8 minutes with rpm>70 and moderate cues to maintain intensity      Lumbar Exercises: Standing   Heel Raises  15 reps;2  seconds;Limitations    Heel Raises Limitations  with bil UE support of // bars  Also performed single limb stance with heel raise x 10 each side with moderate-max cues for technique and bil UE support.      Knee/Hip Exercises: Standing   Other Standing Knee Exercises  Standing on black surface of BOSU for static balance in // bars then mini squats x 10 with bil UE support in bars        PT Long Term Goals - 05/09/19 1732      PT LONG TERM GOAL #1   Title  Pt will increase DGI score from 16/24 to >/= 19/24  to demo improved gait and reduced fall risk.    Baseline  16/24 on 04-18-19    Time  4    Period  Weeks    Status  New      PT LONG TERM GOAL #2   Title  Improve TUG score from 15.22 secs to </= 12.5 secs without use of assistive device to demo improved functional mobility with reduced fall risk.    Baseline  15.22 secs 1st trial; 12.81 secs 2nd trial but with moderately increased gait deviations - 04-18-19    Time  4    Period  Weeks    Status  New      PT LONG TERM GOAL #3   Title  Pt will negotiate steps with 1 rail only using a step over step sequence; sister will report pt negotiating steps at home in this manner (states he currently hugs the rail with both hands due to fear of falling).     Baseline  sister states pt is now fearful with step negotiation again - as he was in previous admisison (Dec. 2019)  - 04-18-19    Time  4    Period  Weeks    Status  New      PT LONG TERM GOAL #4   Title  Increase gait velocity from 3.19  ft/sec to >/= 3.7 ft/sec for increased gait efficiency.    Baseline  10.27 secs on 04-18-19 - no device used    Time  4    Period  Weeks    Status  New      PT LONG TERM GOAL #5   Title  Jog  200' on flat, even surface without LOB to enable pt to return to recreational activities including playing soccer.    Baseline  jogged 60' on 04-18-19    Time  4    Period  Weeks    Status  New      PT LONG TERM GOAL #6   Title  Independent in HEP for  balance and strengthening, including returning to workouts at [facility] when facility reopens.    Time  4    Period  Weeks    Status  New      PT LONG TERM GOAL #7   Title  Pt wil perform SLS on each leg for at least 2 secs for increased safety with curb negotiation, lower body dressing in standing, etc.    Baseline  unable to stand on either leg for measurable time period    Time  4    Period  Weeks    Status  New      PT LONG TERM GOAL  #9   Status  Achieved            Plan - 05/17/19 1615    Clinical Impression Statement  Pt continues with decreased coordination.  Needs mod-max cues for tasks and takes excessive reps before he has any carryover.  Pt hesitant/resistant to weight shift or stand on RLE impacting gait, balance and stairs.  Continue PT per POC.    Rehab Potential  Good    Clinical Impairments Affecting Rehab Potential  language barrier and cognitive deficits - pt's sister [name] interpreted at eval    PT Frequency  2x / week    PT Duration  6 weeks    PT Treatment/Interventions  ADLs/Self Care Home Management;Aquatic Therapy;Gait training;Stair training;Therapeutic activities;Therapeutic exercise;Balance training;Neuromuscular re-education;Patient/family education    PT Next Visit Plan  Continue coordination activities, gait, jogging, eliptical and treadmill  -soccer?  and please continue to work on gait pattern to reduce gait deviations    PT Home Exercise Plan   [id number]; added side, forward and back kicks and braiding from [facility] on 10-27-18    Consulted and Agree with Plan of Care  Patient;Family member/caregiver    Family Member Consulted  sister [name]       Patient will benefit from skilled therapeutic intervention in order to improve the following deficits and impairments:  Abnormal gait, Decreased activity tolerance, Decreased balance, Decreased cognition, Decreased coordination, Decreased strength  Visit Diagnosis: 1. Other lack of coordination   2. Other  abnormalities of gait and mobility   3. Unsteadiness on feet        Problem List Patient Active Problem List   Diagnosis Date Noted  . Microalbuminuria due to type 2 diabetes mellitus ([place]) 04/11/2019  . Hypothyroidism 09/21/2018  . Seizure disorder ([place]) 06/16/2018  . Low testosterone 06/16/2018  . Traumatic brain injury with loss of consciousness ([place]) 10/29/2017  . Anemia 10/29/2017  . Positive depression screening 10/29/2017  . Attention and concentration deficit   . Type 2 diabetes mellitus with peripheral neuropathy ([facility])   . S/P craniotomy 03/19/2017  . Essential hypertension   . Dysphagia, oropharyngeal 02/01/2017  . Diabetes type 2, uncontrolled ([place]) 06/13/2015    Narda Bonds, PTA [place] 05/17/19 4:19 PM Phone: [phone] Fax: [place] [address] [place], [place], [address] Phone: [phone]   Fax:  [phone]  Name: Vin Yonke MRN: [id number] Date of Birth: [birth year]

## 2019-05-26 ENCOUNTER — Encounter

## 2019-05-29 ENCOUNTER — Other Ambulatory Visit: Payer: Self-pay

## 2019-05-29 ENCOUNTER — Ambulatory Visit: Payer: Self-pay | Admitting: Physical Therapy

## 2019-05-29 DIAGNOSIS — R278 Other lack of coordination: Secondary | ICD-10-CM

## 2019-05-29 DIAGNOSIS — R2681 Unsteadiness on feet: Secondary | ICD-10-CM

## 2019-05-29 DIAGNOSIS — R2689 Other abnormalities of gait and mobility: Secondary | ICD-10-CM

## 2019-05-29 DIAGNOSIS — M6281 Muscle weakness (generalized): Secondary | ICD-10-CM

## 2019-05-29 NOTE — Therapy (Addendum)
[facility] [address] [place], [place], [address] Phone: [phone]   Fax:  [phone]  Physical Therapy Treatment  Patient Details  Name: [name] MRN: [id number] Date of Birth: [birth year] Referring Provider (PT): Dr. Alger Simons   Encounter Date: 05/29/2019   CLINIC OPERATION CHANGES: Outpatient Neuro Rehab is open at lower capacity following universal masking, social distancing, and patient screening.  The patient's COVID risk of complications score is 3.   PT End of Session - 05/29/19 1049    Visit Number  8    Number of Visits  16    Date for PT Re-Evaluation  07/05/19    Authorization Type  CAFA 100% thru 07-05-19    Authorization Time Period  thru 07-05-19    PT Start Time  1000    PT Stop Time  1045    PT Time Calculation (min)  45 min    Activity Tolerance  Patient tolerated treatment well    Behavior During Therapy  [facility] for tasks assessed/performed       Past Medical History:  Diagnosis Date  . Diabetes mellitus without complication ([place])   . Seizures ([place])   . TBI (traumatic brain injury) ([place]) 03/2017    Past Surgical History:  Procedure Laterality Date  . CRANIOTOMY Right 01/13/2017   Procedure: 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, Placement of bone flap in abdomen;  Surgeon: Ashok Pall, MD;  Location: [facility];  Service: Neurosurgery;  Laterality: Right;  . CRANIOTOMY Right 03/19/2017   Procedure: Cranioplasty with placement of bone flap from abdomen;  Surgeon: Eustace Moore, MD;  Location: [facility];  Service: Neurosurgery;  Laterality: Right;    There were no vitals filed for this visit.  Subjective Assessment - 05/29/19 1001    Subjective  Nothing new to report.  Sister gave pt a hair cut this morning.    Patient is accompained by:  Family member   sister interpreted due to machine being used by another therapist   Pertinent History  TBI  secondary to SDH due to fall on 01-10-17; Rt craniectomy for hematoma on 01-13-17; Cranioplasty with placement of bone flap from abdomen on 03-19-17; pt received HH (few visits per sister's report) and no additional therapies due to lack of insurance at that time;  DM type 2 with peripheral neuropathy;  seizures    Patient Stated Goals  Improve walking and balance    Currently in Pain?  No/denies         [facility] PT Assessment - 05/29/19 1011      Assessment   Medical Diagnosis  TBI due to SDH due to fall    Referring Provider (PT)  Dr. Alger Simons    Onset Date/Surgical Date  01/10/17    Hand Dominance  Right    Prior Therapy  Home health - last year -  few visits only due to lack of insurance       Precautions   Precautions  Other (comment);Fall      Prior Function   Level of Independence  Needs assistance with ADLs      Special Tests    Special Tests  --    Other special tests  SLS - pt only able to maintain ~1 second on each LE.  Unable to keep weight shifted laterally over stance LE.  Demonstrated to pt how to use door frame and reaching up and to the side R/L to shift weight fully  and maintain weight shift while lifting contralateral LE to improve SLS      Ambulation/Gait   Ambulation/Gait  Yes    Ambulation/Gait Assistance  5: Supervision    Ambulation/Gait Assistance Details  jogging on level, indoor surface with no LOB but intermittent slowing of speed to a fast walk    Ambulation Distance (Feet)  230 Feet    Assistive device  None    Gait Pattern  Wide base of support;Decreased step length - left;Decreased step length - right    Ambulation Surface  Level;Indoor    Stairs  Yes    Stairs Assistance  5: Supervision    Stairs Assistance Details (indicate cue type and reason)  initial step when ascending pt uses rail to pull COG forwards.  When descending pt widens stance and turns sideways to control forward weight shift    Stair Management Technique  One rail Left;Alternating  pattern;Forwards    Number of Stairs  12    Height of Stairs  6      Standardized Balance Assessment   Standardized Balance Assessment  Dynamic Gait Index;Timed Up and Go Test;10 meter walk test    10 Meter Walk  9.12 seconds or 3.6 ft/sec      Dynamic Gait Index   Level Surface  Mild Impairment    Change in Gait Speed  Mild Impairment    Gait with Horizontal Head Turns  Mild Impairment    Gait with Vertical Head Turns  Mild Impairment    Gait and Pivot Turn  Mild Impairment    Step Over Obstacle  Mild Impairment    Step Around Obstacles  Normal    Steps  Mild Impairment    Total Score  17    DGI comment:  17/24      Timed Up and Go Test   TUG  Normal TUG    Normal TUG (seconds)  11.19    TUG Comments  no AD                           PT Education - 05/29/19 1049    Education Details  progress towards goals and areas to continue to address with additional visits    Person(s) Educated  Patient;Other (comment)   sister   Methods  Explanation    Comprehension  Verbalized understanding          PT Long Term Goals - 05/29/19 1026      PT LONG TERM GOAL #1   Title  Pt will increase DGI score from 16/24 to >/= 19/24  to demo improved gait and reduced fall risk.    Baseline  17/24    Time  4    Period  Weeks    Status  Partially Met      PT LONG TERM GOAL #2   Title  Improve TUG score from 15.22 secs to </= 12.5 secs without use of assistive device to demo improved functional mobility with reduced fall risk.    Baseline  11 seconds    Time  4    Period  Weeks    Status  Achieved      PT LONG TERM GOAL #3   Title  Pt will negotiate steps with 1 rail only using a step over step sequence; sister will report pt negotiating steps at home in this manner (states he currently hugs the rail with both hands due to fear of falling).  Time  4    Period  Weeks    Status  Achieved      PT LONG TERM GOAL #4   Title  Increase gait velocity from 3.19  ft/sec  to >/= 3.7 ft/sec for increased gait efficiency.    Baseline  3.6 ft/sec    Time  4    Period  Weeks    Status  Partially Met      PT LONG TERM GOAL #5   Title  Jog  200' on flat, even surface without LOB to enable pt to return to recreational activities including playing soccer.    Baseline  jogged 230'    Time  4    Period  Weeks    Status  Achieved      PT LONG TERM GOAL #6   Title  Independent in HEP for balance and strengthening, including returning to workouts at [facility] when facility reopens.    Time  4    Period  Weeks    Status  Achieved      PT LONG TERM GOAL #7   Title  Pt wil perform SLS on each leg for at least 2 secs for increased safety with curb negotiation, lower body dressing in standing, etc.    Baseline  1 second    Time  4    Period  Weeks    Status  Not Met      PT LONG TERM GOAL  #9   Status  --        New LTG for 4 week recertification:   PT Long Term Goals - 05/29/19 2153      PT LONG TERM GOAL #1   Title  Pt will increase DGI score from 16/24 to >/= 19/24  to demo improved gait and reduced fall risk.    Baseline  17/24    Time  4    Period  Weeks    Status  Revised    Target Date  07/05/19      PT LONG TERM GOAL #3   Title  Pt will negotiate steps with 1 rail only using a step over step sequence MOD I with decreased use of UE to pull and advance COG forwards    Baseline  supervision-pulls with UE when ascending, turns sideways to descend with control    Time  4    Period  Weeks    Status  Revised    Target Date  07/05/19      PT LONG TERM GOAL #4   Title  Increase gait velocity from 3.19  ft/sec to >/= 3.8 ft/sec for increased gait efficiency.    Baseline  3.6 ft/sec    Time  4    Period  Weeks    Status  Revised    Target Date  07/05/19      PT LONG TERM GOAL #5   Title  Jog  200' on grassy terrain with soccer ball without LOB to enable pt to return to recreational activities.    Baseline  jogged 230' indoors, no ball    Time  4     Period  Weeks    Status  Revised    Target Date  07/05/19      PT LONG TERM GOAL #6   Title  Independent in HEP for balance and strengthening, including returning to workouts at [facility] when facility reopens.    Time  4    Period  Weeks  Status  Revised    Target Date  07/05/19      PT LONG TERM GOAL #7   Title  Pt wil perform SLS on each leg for at least 5 secs for increased safety with curb negotiation, lower body dressing in standing, etc.    Baseline  1 second    Time  4    Period  Weeks    Status  Revised    Target Date  07/05/19           Plan - 05/29/19 1053    Clinical Impression Statement  Treatment session today focused on assessment of progress towards LTG.  Pt is making steady progress and has met 4/6 LTG, partially met 1 goal but did not meet SLS goal. He demonstrates slightly decreased falls risk and improved safety as indicated by improvement in TUG and slight improvement in DGI.  Pt also demonstrates improved stair negotiation sequencing but continues to utilize rail to advance COG forwards on stairs.  Pt continues to have significant difficulty weight shifting to perform single limb stance activities and difficulty controlling forward weight shifting and momentum placing him at increased risk for falls during more dynamic gait (negotiating obstacles, changing gait speed, stair negotiation).  Pt will benefit from continued skilled PT services to continue to address these balance impairments to maximize functional mobility independence and decrease falls risk.    Rehab Potential  Good    Clinical Impairments Affecting Rehab Potential  language barrier and cognitive deficits - pt's sister [name] interpreted at eval    PT Frequency  2x / week    PT Duration  Other (comment)   5 weeks   PT Treatment/Interventions  ADLs/Self Care Home Management;Aquatic Therapy;Gait training;Stair training;Therapeutic activities;Therapeutic exercise;Balance training;Neuromuscular  re-education;Patient/family education;Functional mobility training    PT Next Visit Plan  jogging - try outside over grass, add soccer ball?  Has a hard time controlling forward momentum during gait and stair negotiation - continue to work on stair negotiation with reciprocal sequence with decreased use of UE to pull, stepping over objects, changing gait speed.  Continue to work on weight shifting for SLS (used arm up the wall for lateral weight shift).    PT Home Exercise Plan   [id number]; added side, forward and back kicks and braiding from [facility] on 10-27-18    Consulted and Agree with Plan of Care  Patient;Family member/caregiver    Family Member Consulted  sister [name]       Patient will benefit from skilled therapeutic intervention in order to improve the following deficits and impairments:  Abnormal gait, Decreased activity tolerance, Decreased balance, Decreased cognition, Decreased coordination, Decreased strength, Decreased safety awareness, Difficulty walking  Visit Diagnosis: 1. Other lack of coordination   2. Other abnormalities of gait and mobility   3. Unsteadiness on feet   4. Muscle weakness (generalized)        Problem List Patient Active Problem List   Diagnosis Date Noted  . Microalbuminuria due to type 2 diabetes mellitus ([place]) 04/11/2019  . Hypothyroidism 09/21/2018  . Seizure disorder ([place]) 06/16/2018  . Low testosterone 06/16/2018  . Traumatic brain injury with loss of consciousness ([place]) 10/29/2017  . Anemia 10/29/2017  . Positive depression screening 10/29/2017  . Attention and concentration deficit   . Type 2 diabetes mellitus with peripheral neuropathy ([facility])   . S/P craniotomy 03/19/2017  . Essential hypertension   . Dysphagia, oropharyngeal 02/01/2017  . Diabetes type 2, uncontrolled ([place]) 06/13/2015  Rico Junker, PT, DPT 05/29/19    9:52 PM    [facility] [address] [place], [place], [address] Phone: [phone]   Fax:  [phone]  Name: Aubert Choyce MRN: [id number] Date of Birth: 08-09-1966

## 2019-05-29 NOTE — Addendum Note (Signed)
Addended by: Misty Stanley F on: 05/29/2019 10:04 PM   Modules accepted: Orders

## 2019-05-31 ENCOUNTER — Ambulatory Visit: Payer: Self-pay | Admitting: Physical Therapy

## 2019-06-06 ENCOUNTER — Other Ambulatory Visit: Payer: Self-pay | Admitting: Internal Medicine

## 2019-06-06 DIAGNOSIS — E119 Type 2 diabetes mellitus without complications: Secondary | ICD-10-CM

## 2019-06-06 MED FILL — ?ATORVASTATIN 10 MG TABLET: 10 | 60 days supply | Qty: 60 | Fill #8

## 2019-06-06 MED FILL — LISINOPRIL 2.5 MG TABLET: 2.5 | 30 days supply | Qty: 30 | Fill #1

## 2019-06-06 MED FILL — metFORMIN HCL 1000 MG TABS: 1000 | 30 days supply | Qty: 60 | Fill #0

## 2019-06-06 MED FILL — levETIRAcetam 500 MG TABS: 500 | 30 days supply | Qty: 60 | Fill #2

## 2019-06-09 ENCOUNTER — Encounter

## 2019-06-14 ENCOUNTER — Ambulatory Visit: Payer: Self-pay | Attending: Physical Medicine & Rehabilitation | Admitting: Physical Therapy

## 2019-06-14 ENCOUNTER — Other Ambulatory Visit: Payer: Self-pay

## 2019-06-14 ENCOUNTER — Encounter: Payer: Self-pay | Admitting: Physical Therapy

## 2019-06-14 DIAGNOSIS — M6281 Muscle weakness (generalized): Secondary | ICD-10-CM | POA: Insufficient documentation

## 2019-06-14 DIAGNOSIS — R2689 Other abnormalities of gait and mobility: Secondary | ICD-10-CM | POA: Insufficient documentation

## 2019-06-14 DIAGNOSIS — R278 Other lack of coordination: Secondary | ICD-10-CM | POA: Insufficient documentation

## 2019-06-14 DIAGNOSIS — R2681 Unsteadiness on feet: Secondary | ICD-10-CM | POA: Insufficient documentation

## 2019-06-14 NOTE — Therapy (Signed)
[name] [address] [place], [place], [address] Phone: [phone]   Fax:  [phone]  Physical Therapy Treatment  Patient Details  Name: Jamie BeckwithMiguel Garcia Berg MRN: [id number] Date of Birth: April 21, 1966 Referring Provider (PT): Dr. Faith RogueZachary Swartz   Encounter Date: 06/14/2019  PT End of Session - 06/14/19 1235    Visit Number  9    Number of Visits  16    Date for PT Re-Evaluation  07/05/19    Authorization Type  CAFA 100% thru 07-05-19    Authorization Time Period  thru 07-05-19    PT Start Time  0851    PT Stop Time  0930    PT Time Calculation (min)  39 min    Activity Tolerance  Patient tolerated treatment well    Behavior During Therapy  [facility] for tasks assessed/performed       Past Medical History:  Diagnosis Date  . Diabetes mellitus without complication ([facility])   . Seizures ([facility])   . TBI (traumatic brain injury) ([facility]) 03/2017    Past Surgical History:  Procedure Laterality Date  . CRANIOTOMY Right 01/13/2017   Procedure: 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, Placement of bone flap in abdomen;  Surgeon: Coletta MemosKyle Cabbell, MD;  [facility];  Service: Neurosurgery;  Laterality: Right;  . CRANIOTOMY Right 03/19/2017   Procedure: Cranioplasty with placement of bone flap from abdomen;  Surgeon: [name], [name], MD;  [facility];  Service: Neurosurgery;  Laterality: Right;    There were no vitals filed for this visit.  Subjective Assessment - 06/14/19 0853    Subjective  Nothing new to report.  Did not have therapy last week due to lack of visit appointments.    Patient is accompained by:  Family member   sister interpreted due to machine being used by another therapist   Pertinent History  TBI secondary to SDH due to fall on 01-10-17; Rt craniectomy for hematoma on 01-13-17; Cranioplasty with placement of bone flap from abdomen on 03-19-17; pt received HH (few visits per sister's  report) and no additional therapies due to lack of insurance at that time;  DM type 2 with peripheral neuropathy;  seizures    Patient Stated Goals  Improve walking and balance    Currently in Pain?  No/denies                       [facility] Adult PT Treatment/Exercise - 06/14/19 0910      High Level Balance   High Level Balance Activities  Side stepping;Backward walking    High Level Balance Comments  forwards and backwards jogging on uneven grass; side stepping shuffle to L and R along uneven grass.  Added in dribbling soccer ball to forwards jogging and then passing soccer ball back and forth with therapist while jogging forwards in grass and laterally to L and R in grass      Knee/Hip Exercises: Standing   Forward Step Up  Right;Left;2 sets;10 reps;Hand Hold: 1;Hand Hold: 0;Step Height: 4";Step Height: 6"    Forward Step Up Limitations  10 reps each side - step up onto 4" with contralateral LE tap to 6" step in front beginning with 1 UE support progressing to no UE support with pt demonstrating increased L lateral lean with trunk and decreased weight shift to R.  Returned to door way and performed reaching up and to the R for full weight shift performing L foot  taps and then returning to step up, tap with UE on tall cabinet to facilitate increased weight shift to R for single limb stance.                  PT Long Term Goals - 05/29/19 2153      PT LONG TERM GOAL #1   Title  Pt will increase DGI score from 16/24 to >/= 19/24  to demo improved gait and reduced fall risk.    Baseline  17/24    Time  4    Period  Weeks    Status  Revised    Target Date  07/05/19      PT LONG TERM GOAL #3   Title  Pt will negotiate steps with 1 rail only using a step over step sequence MOD I with decreased use of UE to pull and advance COG forwards    Baseline  supervision-pulls with UE when ascending, turns sideways to descend with control    Time  4    Period  Weeks    Status   Revised    Target Date  07/05/19      PT LONG TERM GOAL #4   Title  Increase gait velocity from 3.19  ft/sec to >/= 3.8 ft/sec for increased gait efficiency.    Baseline  3.6 ft/sec    Time  4    Period  Weeks    Status  Revised    Target Date  07/05/19      PT LONG TERM GOAL #5   Title  Jog  200' on grassy terrain with soccer ball without LOB to enable pt to return to recreational activities.    Baseline  jogged 230' indoors, no ball    Time  4    Period  Weeks    Status  Revised    Target Date  07/05/19      PT LONG TERM GOAL #6   Title  Independent in HEP for balance and strengthening, including returning to workouts at [facility] when facility reopens.    Time  4    Period  Weeks    Status  Revised    Target Date  07/05/19      PT LONG TERM GOAL #7   Title  Pt wil perform SLS on each leg for at least 5 secs for increased safety with curb negotiation, lower body dressing in standing, etc.    Baseline  1 second    Time  4    Period  Weeks    Status  Revised    Target Date  07/05/19            Plan - 06/14/19 1236    Clinical Impression Statement  Continued higher level balance training with focus on jogging and coordination activities outside on uneven terrain with dual task of dribbling and passing a soccer ball.  Pt able to perform with no LOB.  Also continued to address single limb stance balance, weight shifting and control with step ups and stair negotiation.  Pt tolerated well and will benefit from continued services to address ongoing impairments to maximize functional mobility and decrease falls risk.    Rehab Potential  Good    Clinical Impairments Affecting Rehab Potential  language barrier and cognitive deficits - pt's sister [name] interpreted at eval    PT Frequency  2x / week    PT Duration  Other (comment)   5 weeks   PT Treatment/Interventions  ADLs/Self Care Home Management;Aquatic Therapy;Gait training;Stair training;Therapeutic activities;Therapeutic  exercise;Balance training;Neuromuscular re-education;Patient/family education;Functional mobility training    PT Next Visit Plan  Continued jogging outside on grass with soccer ball passes and dribbling.  Has a hard time controlling forward momentum during gait and stair negotiation - continue to work on stair negotiation with reciprocal sequence with decreased use of UE to pull, stepping over objects, changing gait speed.  Continue to work on weight shifting for SLS (used arm up the wall for lateral weight shift).    PT Home Exercise Plan   [id number]; added side, forward and [facility]    Consulted and Agree with Plan of Care  Patient;Family member/caregiver    Family Member Consulted  sister [name]       Patient will benefit from skilled therapeutic intervention in order to improve the following deficits and impairments:  Abnormal gait, Decreased activity tolerance, Decreased balance, Decreased cognition, Decreased coordination, Decreased strength, Decreased safety awareness, Difficulty walking  Visit Diagnosis: 1. Other lack of coordination   2. Other abnormalities of gait and mobility   3. Unsteadiness on feet   4. Muscle weakness (generalized)        Problem List Patient Active Problem List   Diagnosis Date Noted  . Microalbuminuria due to type 2 diabetes mellitus ([facility]) 04/11/2019  . Hypothyroidism 09/21/2018  . Seizure disorder ([facility]) 06/16/2018  . Low testosterone 06/16/2018  . Traumatic brain injury with loss of consciousness ([facility]) 10/29/2017  . Anemia 10/29/2017  . Positive depression screening 10/29/2017  . Attention and concentration deficit   . Type 2 diabetes mellitus with peripheral neuropathy ([facility])   . S/P craniotomy 03/19/2017  . Essential hypertension   . Dysphagia, oropharyngeal 02/01/2017  . Diabetes type 2, uncontrolled ([facility]) 06/13/2015    Dierdre HighmanAudra F Wilmer Santillo, PT, DPT 06/14/19    4:02 PM    [facility]  [facility] [address] [place], [place], [address] Phone: [phone]   Fax:  [phone]  Name: Jamie BeckwithMiguel Garcia Berg MRN: [id number] Date of Birth: Mar 26, 1966

## 2019-06-19 ENCOUNTER — Ambulatory Visit: Payer: Self-pay | Admitting: Physical Therapy

## 2019-06-20 ENCOUNTER — Ambulatory Visit: Payer: Self-pay | Admitting: Physical Therapy

## 2019-06-20 ENCOUNTER — Encounter: Payer: Self-pay | Admitting: Physical Therapy

## 2019-06-20 ENCOUNTER — Other Ambulatory Visit: Payer: Self-pay

## 2019-06-20 DIAGNOSIS — R2689 Other abnormalities of gait and mobility: Secondary | ICD-10-CM

## 2019-06-20 DIAGNOSIS — R278 Other lack of coordination: Secondary | ICD-10-CM

## 2019-06-20 DIAGNOSIS — R2681 Unsteadiness on feet: Secondary | ICD-10-CM

## 2019-06-20 NOTE — Therapy (Signed)
[facility] [address] [place], [place], [address] Phone: [phone]   Fax:  [phone]  Physical Therapy Treatment  Patient Details  Name: [name] MRN: [id number] Date of Birth: [birth year] Referring Provider (PT): Dr. Faith RogueZachary Swartz   Encounter Date: 06/20/2019  PT End of Session - 06/20/19 1301    Visit Number  10    Number of Visits  16    Date for PT Re-Evaluation  07/05/19    Authorization Type  CAFA 100% thru 07-05-19    Authorization Time Period  thru 07-05-19    PT Start Time  0935    PT Stop Time  1015    PT Time Calculation (min)  40 min    Activity Tolerance  Patient tolerated treatment well    Behavior During Therapy  [facility] assessed/performed       Past Medical History:  Diagnosis Date  . Diabetes mellitus without complication (HCC)   . Seizures (HCC)   . TBI (traumatic brain injury) (HCC) 03/2017    Past Surgical History:  Procedure Laterality Date  . CRANIOTOMY Right 01/13/2017   Procedure: 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, Placement of bone flap in abdomen;  Surgeon: Coletta MemosKyle Cabbell, MD;  [facility];  Service: Neurosurgery;  Laterality: Right;  . CRANIOTOMY Right 03/19/2017   Procedure: Cranioplasty with placement of bone flap from abdomen;  Surgeon: [name], [name], MD;  [facility];  Service: Neurosurgery;  Laterality: Right;    There were no vitals filed for this visit.  Subjective Assessment - 06/20/19 1234    Subjective  Denies any falls or changes    Patient is accompained by:  Family member   sister interpreted due to machine being used by another therapist   Pertinent History  TBI secondary to SDH due to fall on 01-10-17; Rt craniectomy for hematoma on 01-13-17; Cranioplasty with placement of bone flap from abdomen on 03-19-17; pt received HH (few visits per sister's report) and no additional therapies due to lack of insurance  at that time;  DM type 2 with peripheral neuropathy;  seizures    Patient Stated Goals  Improve walking and balance    Currently in Pain?  No/denies         [facility] - 06/20/19 0001      Transfers   Transfers  Sit to Stand;Stand to Sit    Sit to Stand  7: Independent    Stand to Sit  7: Independent      Ambulation/Gait   Ambulation/Gait  Yes    Ambulation/Gait Assistance  5: Supervision    Ambulation Distance (Feet)  110 Feet   x 2 and through out gym for activities   Assistive device  None    Gait Pattern  Wide base of support;Decreased step length - left;Decreased step length - right    Ambulation Surface  Level;Indoor      High Level Balance   High Level Balance Activities  Side stepping;Other (comment)    High Level Balance Comments  trampoline with 1 UE support up/down, in/out and staggered stance;floor ladder step over step then had pt use ball in UE's to decreased trunk compensating/over movement.  Pt with improved performance while holding ball straight overhead.  Cone taps without UE support with moderate cues to increase stance time.  Performed same with pt holding ball with improved performance;In // bars had pt do static stance with one foot  on cone and trying to maintain position without UE support.  Again pt needed moderate cues to maintain stance, especially with L foot on cone.      Knee/Hip Exercises: Aerobic   Elliptical  Level 2.5 2 minutes forward and 1 minute backward with UE support    Other Aerobic  Scifit level 2.5 all 4 extremities x 5 minutes for flexibility working on maintaining >70rpm         PT Long Term Goals - 05/29/19 2153      PT LONG TERM GOAL #1   Title  Pt will increase DGI score from 16/24 to >/= 19/24  to demo improved gait and reduced fall risk.    Baseline  17/24    Time  4    Period  Weeks    Status  Revised    Target Date  07/05/19      PT LONG TERM GOAL #3   Title  Pt will negotiate steps with 1 rail  only using a step over step sequence MOD I with decreased use of UE to pull and advance COG forwards    Baseline  supervision-pulls with UE when ascending, turns sideways to descend with control    Time  4    Period  Weeks    Status  Revised    Target Date  07/05/19      PT LONG TERM GOAL #4   Title  Increase gait velocity from 3.19  ft/sec to >/= 3.8 ft/sec for increased gait efficiency.    Baseline  3.6 ft/sec    Time  4    Period  Weeks    Status  Revised    Target Date  07/05/19      PT LONG TERM GOAL #5   Title  Jog  200' on grassy terrain with soccer ball without LOB to enable pt to return to recreational activities.    Baseline  jogged 230' indoors, no ball    Time  4    Period  Weeks    Status  Revised    Target Date  07/05/19      PT LONG TERM GOAL #6   Title  Independent in HEP for balance and strengthening, including [facility].    Time  4    Period  Weeks    Status  Revised    Target Date  07/05/19      PT LONG TERM GOAL #7   Title  Pt wil perform SLS on each leg for at least 5 secs for increased safety with curb negotiation, lower body dressing in standing, etc.    Baseline  1 second    Time  4    Period  Weeks    Status  Revised    Target Date  07/05/19            Plan - 06/20/19 1301    Clinical Impression Statement  Pt with improved performance today with floor ladder and SLS activities.  Continues to need cues for proper technique and overuses trunk at times.  Continue PT per POC.    Rehab Potential  Good    Clinical Impairments Affecting Rehab Potential  language barrier and cognitive deficits - pt's sister [name] interpreted at eval    PT Frequency  2x / week    PT Duration  Other (comment)   5 weeks   PT Treatment/Interventions  ADLs/Self Care Home Management;Aquatic Therapy;Gait training;Stair training;Therapeutic activities;Therapeutic exercise;Balance  training;Neuromuscular re-education;Patient/family  education;Functional mobility training    PT Next Visit Plan  Continued jogging outside on grass with soccer ball passes and dribbling.  Has a hard time controlling forward momentum during gait and stair negotiation - continue to work on stair negotiation with reciprocal sequence with decreased use of UE to pull, stepping over objects, changing gait speed.  Continue to work on weight shifting for SLS (used arm up the wall for lateral weight shift).    PT Home Exercise Plan   [id number]; added side, forward and back kicks and braiding from [facility] on 10-27-18    Consulted and Agree with Plan of Care  Patient;Family member/caregiver    Family Member Consulted  sister [name]       Patient will benefit from skilled therapeutic intervention in order to improve the following deficits and impairments:  Abnormal gait, Decreased activity tolerance, Decreased balance, Decreased cognition, Decreased coordination, Decreased strength, Decreased safety awareness, Difficulty walking  Visit Diagnosis: 1. Other lack of coordination   2. Other abnormalities of gait and mobility   3. Unsteadiness on feet        Problem List Patient Active Problem List   Diagnosis Date Noted  . Microalbuminuria due to type 2 diabetes mellitus ([place]) 04/11/2019  . Hypothyroidism 09/21/2018  . Seizure disorder ([place]) 06/16/2018  . Low testosterone 06/16/2018  . Traumatic brain injury with loss of consciousness ([place]) 10/29/2017  . Anemia 10/29/2017  . Positive depression screening 10/29/2017  . Attention and concentration deficit   . Type 2 diabetes mellitus with peripheral neuropathy (HCC)   . S/P craniotomy 03/19/2017  . Essential hypertension   . Dysphagia, oropharyngeal 02/01/2017  . Diabetes type 2, uncontrolled ([place]) 06/13/2015    Narda Bonds, PTA [place] 06/20/19 1:04 PM Phone: [phone] Fax: [place] [address] [place], [place], [address] Phone: [phone]   Fax:  [phone]  Name: [name] MRN: [id number] Date of Birth: 02/16/1966

## 2019-06-22 ENCOUNTER — Other Ambulatory Visit: Payer: Self-pay

## 2019-06-22 ENCOUNTER — Ambulatory Visit: Payer: Self-pay | Admitting: Physical Therapy

## 2019-06-22 DIAGNOSIS — R2681 Unsteadiness on feet: Secondary | ICD-10-CM

## 2019-06-22 DIAGNOSIS — M6281 Muscle weakness (generalized): Secondary | ICD-10-CM

## 2019-06-22 DIAGNOSIS — R2689 Other abnormalities of gait and mobility: Secondary | ICD-10-CM

## 2019-06-23 NOTE — Therapy (Signed)
[facility] [address] [place], [place], [address] Phone: [phone]   Fax:  [phone]  Physical Therapy Treatment  Patient Details  Name: [name] MRN: [id number] Date of Birth: 04/18/1966 Referring Provider (PT): Dr. Faith RogueZachary Swartz   Encounter Date: 06/22/2019  PT End of Session - 06/23/19 1314    Visit Number  11    Number of Visits  13    Date for PT Re-Evaluation  07/05/19    Authorization Type  CAFA 100% thru 07-05-19    Authorization Time Period  thru 07-05-19    PT Start Time  0803    PT Stop Time  0850    PT Time Calculation (min)  47 min    Activity Tolerance  Patient tolerated treatment well    Behavior During Therapy  [facility] for tasks assessed/performed       Past Medical History:  Diagnosis Date  . Diabetes mellitus without complication (HCC)   . Seizures (HCC)   . TBI (traumatic brain injury) (HCC) 03/2017    Past Surgical History:  Procedure Laterality Date  . CRANIOTOMY Right 01/13/2017   Procedure: 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, Placement of bone flap in abdomen;  Surgeon: Coletta MemosKyle Cabbell, MD;  [facility];  Service: Neurosurgery;  Laterality: Right;  . CRANIOTOMY Right 03/19/2017   Procedure: Cranioplasty with placement of bone flap from abdomen;  Surgeon: [name], [name], MD;  [facility];  Service: Neurosurgery;  Laterality: Right;    There were no vitals filed for this visit.  Subjective Assessment - 06/23/19 1308    Subjective  Pt accompanied to PT by his sister - reports no changes; she reports pt continues to grab onto stair rail at home with both hands if she does not remind him to hold with 1 hand only    Patient is accompained by:  Family member   sister interpreted due to machine being used by another therapist   Pertinent History  TBI secondary to SDH due to fall on 01-10-17; Rt craniectomy for hematoma on 01-13-17; Cranioplasty  with placement of bone flap from abdomen on 03-19-17; pt received HH (few visits per sister's report) and no additional therapies due to lack of insurance at that time;  DM type 2 with peripheral neuropathy;  seizures    Patient Stated Goals  Improve walking and balance    Currently in Pain?  No/denies                       [facility] Treatment/Exercise - 06/23/19 0001      Ambulation/Gait   Ambulation/Gait  Yes    Ambulation/Gait Assistance  5: Supervision    Ambulation/Gait Assistance Details  cues to increase step length and narrow BOS and to increase arm swing, especially Lt arm swing     Ambulation Distance (Feet)  250 Feet    Assistive device  None    Gait Pattern  Wide base of support;Decreased step length - left;Decreased step length - right    Ambulation Surface  Level;Indoor    Stairs  Yes    Stairs Assistance  5: Supervision    Stairs Assistance Details (indicate cue type and reason)  cues for stance foot placement with toes off edge of step to increase forward weight shift during descension     Stair Management Technique  One rail Left;Alternating pattern;Forwards    Number of Stairs  16  4 steps - 4 reps for total of 16 steps   Height of Stairs  6      Knee/Hip Exercises: Aerobic   Elliptical  level 1.5 1" forward; 1" backward          Balance Exercises - 06/23/19 1312      Balance Exercises: Standing   Rockerboard  Anterior/posterior;10 reps;Other (comment);EO   3 reps with decr. UE support with no UE support on 3rd rep     quadruped position -lifting opposite UE/LE with 3 sec hold Tall kneeling - reaching in various directions to facilitate trunk stabilization  1/2 kneeling on RLE and on LLE with min assist to maintain balance        PT Long Term Goals - 06/23/19 1319      PT LONG TERM GOAL #1   Title  Pt will increase DGI score from 16/24 to >/= 19/24  to demo improved gait and reduced fall risk.    Baseline  17/24    Time  4     Period  Weeks    Status  Revised      PT LONG TERM GOAL #3   Title  Pt will negotiate steps with 1 rail only using a step over step sequence MOD I with decreased use of UE to pull and advance COG forwards    Baseline  supervision-pulls with UE when ascending, turns sideways to descend with control    Time  4    Period  Weeks    Status  Revised      PT LONG TERM GOAL #4   Title  Increase gait velocity from 3.19  ft/sec to >/= 3.8 ft/sec for increased gait efficiency.    Baseline  3.6 ft/sec    Time  4    Period  Weeks    Status  Revised      PT LONG TERM GOAL #5   Title  Jog  200' on grassy terrain with soccer ball without LOB to enable pt to return to recreational activities.    Baseline  jogged 230' indoors, no ball    Time  4    Period  Weeks    Status  Revised      PT LONG TERM GOAL #6   Title  Independent in HEP for balance and strengthening, including [facility].    Time  4    Period  Weeks    Status  Revised      PT LONG TERM GOAL #7   Title  Pt wil perform SLS on each leg for at least 5 secs for increased safety with curb negotiation, lower body dressing in standing, etc.    Baseline  1 second    Time  4    Period  Weeks    Status  Revised            Plan - 06/23/19 1316    Clinical Impression Statement  Pt is progressing towards goals; he continues to have some difficulty with maintaining balance in quadruped position and keeping each knee extended with hip extended, due to decreased trunk and core stabilizaton.  Plan D/C next week.    Rehab Potential  Good    Clinical Impairments Affecting Rehab Potential  language barrier and cognitive deficits - pt's sister Jamie Rodriguezdriana interpreted at eval    PT Frequency  2x / week    PT Duration  Other (comment)   5 weeks  PT Treatment/Interventions  ADLs/Self Care Home Management;Aquatic Therapy;Gait training;Stair training;Therapeutic activities;Therapeutic exercise;Balance  training;Neuromuscular re-education;Patient/family education;Functional mobility training    PT Next Visit Plan  cont balance and gait training    PT Home Exercise Plan   [id number]; added side, forward and back kicks and braiding from [facility] on 10-27-18    Consulted and Agree with Plan of Care  Patient;Family member/caregiver    Family Member Consulted  sister [name]       Patient will benefit from skilled therapeutic intervention in order to improve the following deficits and impairments:  Abnormal gait, Decreased activity tolerance, Decreased balance, Decreased cognition, Decreased coordination, Decreased strength, Decreased safety awareness, Difficulty walking  Visit Diagnosis: Other abnormalities of gait and mobility  Muscle weakness (generalized)  Unsteadiness on feet     Problem List Patient Active Problem List   Diagnosis Date Noted  . Microalbuminuria due to type 2 diabetes mellitus ([place]) 04/11/2019  . Hypothyroidism 09/21/2018  . Seizure disorder ([place]) 06/16/2018  . Low testosterone 06/16/2018  . Traumatic brain injury with loss of consciousness ([place]) 10/29/2017  . Anemia 10/29/2017  . Positive depression screening 10/29/2017  . Attention and concentration deficit   . Type 2 diabetes mellitus with peripheral neuropathy (HCC)   . S/P craniotomy 03/19/2017  . Essential hypertension   . Dysphagia, oropharyngeal 02/01/2017  . Diabetes type 2, uncontrolled ([place]) 06/13/2015    Mayanna Garlitz, [place], PT 06/23/2019, 1:21 PM  [place] [address] [place], [place], [address] Phone: [phone]   Fax:  [phone]  Name: Prabhav Faulkenberry MRN: [id number] Date of Birth: February 22, 1966

## 2019-06-27 ENCOUNTER — Other Ambulatory Visit: Payer: Self-pay

## 2019-06-27 ENCOUNTER — Ambulatory Visit: Payer: Self-pay | Admitting: Physical Therapy

## 2019-06-27 DIAGNOSIS — R2689 Other abnormalities of gait and mobility: Secondary | ICD-10-CM

## 2019-06-27 DIAGNOSIS — R278 Other lack of coordination: Secondary | ICD-10-CM

## 2019-06-27 DIAGNOSIS — R2681 Unsteadiness on feet: Secondary | ICD-10-CM

## 2019-06-28 NOTE — Therapy (Signed)
[facility] [address] [place], [place], [address] Phone: [phone]   Fax:  [phone]  Physical Therapy Treatment  Patient Details  Name: [name] MRN: [id number] Date of Birth: 11/29/1965 Referring Provider (PT): Dr. Alger Simons   Encounter Date: 06/27/2019  PT End of Session - 06/28/19 2015    Visit Number  12    Number of Visits  14    Date for PT Re-Evaluation  07/05/19    Authorization Type  CAFA 100% thru 07-05-19    Authorization Time Period  thru 07-05-19    PT Start Time  1016    PT Stop Time  1101    PT Time Calculation (min)  45 min    Equipment Utilized During Treatment  Gait belt    Activity Tolerance  Patient tolerated treatment well    Behavior During Therapy  [facility] for tasks assessed/performed       Past Medical History:  Diagnosis Date  . Diabetes mellitus without complication ([place])   . Seizures ([place])   . TBI (traumatic brain injury) ([place]) 03/2017    Past Surgical History:  Procedure Laterality Date  . CRANIOTOMY Right 01/13/2017   Procedure: 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, Placement of bone flap in abdomen;  Surgeon: Ashok Pall, MD;  Location: [place];  Service: Neurosurgery;  Laterality: Right;  . CRANIOTOMY Right 03/19/2017   Procedure: Cranioplasty with placement of bone flap from abdomen;  Surgeon: Eustace Moore, MD;  Location: [place];  Service: Neurosurgery;  Laterality: Right;    There were no vitals filed for this visit.                    [place] Adult PT Treatment/Exercise - 06/28/19 0001      Ambulation/Gait   Ambulation/Gait  Yes    Ambulation/Gait Assistance  5: Supervision    Ambulation/Gait Assistance Details  cues to incr. step length and to narrow BOS    Ambulation Distance (Feet)  230 Feet    Assistive device  None    Gait Pattern  Wide base of support;Decreased step length - left;Decreased step length  - right    Ambulation Surface  Level;Indoor          Balance Exercises - 06/28/19 2012      Balance Exercises: Standing   Rockerboard  Anterior/posterior;10 reps;Other (comment);EO   3 reps with decr. UE support with no UE support on 3rd rep   Step Over Hurdles / Cones  stepping over seam line on mat and stepping over balance bubbles on floor    Other Standing Exercises  standing on Bosu inside // bars - with UE support prn - squats x 10 reps; SLS activity - moving one leg up/back and out/in 10 reps each leg              PT Long Term Goals - 06/28/19 2020      PT LONG TERM GOAL #1   Title  Pt will increase DGI score from 16/24 to >/= 19/24  to demo improved gait and reduced fall risk.    Baseline  17/24    Time  4    Period  Weeks    Status  Revised      PT LONG TERM GOAL #3   Title  Pt will negotiate steps with 1 rail only using a step over step sequence MOD I with decreased use of UE to pull and  advance COG forwards    Baseline  supervision-pulls with UE when ascending, turns sideways to descend with control    Time  4    Period  Weeks    Status  Revised      PT LONG TERM GOAL #4   Title  Increase gait velocity from 3.19  ft/sec to >/= 3.8 ft/sec for increased gait efficiency.    Baseline  3.6 ft/sec    Time  4    Period  Weeks    Status  Revised      PT LONG TERM GOAL #5   Title  Jog  200' on grassy terrain with soccer ball without LOB to enable pt to return to recreational activities.    Baseline  jogged 230' indoors, no ball    Time  4    Period  Weeks    Status  Revised      PT LONG TERM GOAL #6   Title  Independent in HEP for balance and strengthening, including [facility].    Time  4    Period  Weeks    Status  Revised      PT LONG TERM GOAL #7   Title  Pt wil perform SLS on each leg for at least 5 secs for increased safety with curb negotiation, lower body dressing in standing, etc.    Baseline  1 second     Time  4    Period  Weeks    Status  Revised            Plan - 06/28/19 2016    Clinical Impression Statement  Pt continues to progress well towards goals - gait pattern improves with practice and repetition of SLS activities.  Pt continues to have some fear of falling with hesitancy to shift weight anteriorly during step negotiation and with rocker board activity.    Rehab Potential  Good    Clinical Impairments Affecting Rehab Potential  language barrier and cognitive deficits - pt's sister [name] interpreted at eval    PT Frequency  2x / week    PT Duration  Other (comment)   5 weeks   PT Treatment/Interventions  ADLs/Self Care Home Management;Aquatic Therapy;Gait training;Stair training;Therapeutic activities;Therapeutic exercise;Balance training;Neuromuscular re-education;Patient/family education;Functional mobility training    PT Next Visit Plan  cont balance and gait training    PT Home Exercise Plan   [id number]; added side, forward and back [facility]    Consulted and Agree with Plan of Care  Patient;Family member/caregiver    Family Member Consulted  sister [name]       Patient will benefit from skilled therapeutic intervention in order to improve the following deficits and impairments:  Abnormal gait, Decreased activity tolerance, Decreased balance, Decreased cognition, Decreased coordination, Decreased strength, Decreased safety awareness, Difficulty walking  Visit Diagnosis: Other abnormalities of gait and mobility  Unsteadiness on feet  Other lack of coordination     Problem List Patient Active Problem List   Diagnosis Date Noted  . Microalbuminuria due to type 2 diabetes mellitus (HCC) 04/11/2019  . Hypothyroidism 09/21/2018  . Seizure disorder (HCC) 06/16/2018  . Low testosterone 06/16/2018  . Traumatic brain injury with loss of consciousness (HCC) 10/29/2017  . Anemia 10/29/2017  . Positive depression screening 10/29/2017   . Attention and concentration deficit   . Type 2 diabetes mellitus with peripheral neuropathy (HCC)   . S/P craniotomy 03/19/2017  . Essential  hypertension   . Dysphagia, oropharyngeal 02/01/2017  . Diabetes type 2, uncontrolled (HCC) 06/13/2015    Margret Moat, Donavan Burnet, PT 06/28/2019, 8:23 PM  [facility] [address] [place], [place], [address] Phone: [phone]   Fax:  [phone]  Name: [name] MRN: [id number] Date of Birth: 25-Feb-1966

## 2019-06-29 ENCOUNTER — Ambulatory Visit: Payer: Self-pay | Admitting: Physical Therapy

## 2019-06-29 ENCOUNTER — Other Ambulatory Visit: Payer: Self-pay | Admitting: Internal Medicine

## 2019-06-29 ENCOUNTER — Other Ambulatory Visit: Payer: Self-pay

## 2019-06-29 DIAGNOSIS — R2689 Other abnormalities of gait and mobility: Secondary | ICD-10-CM

## 2019-06-29 DIAGNOSIS — R2681 Unsteadiness on feet: Secondary | ICD-10-CM

## 2019-06-29 DIAGNOSIS — R278 Other lack of coordination: Secondary | ICD-10-CM

## 2019-06-29 MED FILL — metFORMIN HCL 1000 MG TABS: 1000 | 30 days supply | Qty: 60 | Fill #1

## 2019-06-29 MED FILL — levETIRAcetam 500 MG TABS: 500 | 30 days supply | Qty: 60 | Fill #3

## 2019-06-29 MED FILL — LISINOPRIL 2.5 MG TABLET: 2.5 | 30 days supply | Qty: 30 | Fill #2

## 2019-06-30 NOTE — Therapy (Signed)
[facility] [address] [place], [place], [address] Phone: [phone]   Fax:  [phone]  Physical Therapy Treatment  Patient Details  Name: [name] MRN: [id number] Date of Birth: Dec 29, 1965 Referring Provider (PT): [name]   Encounter Date: 06/29/2019  PT End of Session - 06/30/19 1808    Visit Number  13    Number of Visits  14    Date for PT Re-Evaluation  07/05/19    Authorization Type  CAFA 100% thru 07-05-19    Authorization Time Period  thru 07-05-19    PT Start Time  0803    PT Stop Time  0846    PT Time Calculation (min)  43 min    Activity Tolerance  Patient tolerated treatment well    Behavior During Therapy  [facility] for tasks assessed/performed       Past Medical History:  Diagnosis Date  . Diabetes mellitus without complication (HCC)   . Seizures (HCC)   . TBI (traumatic brain injury) (HCC) 03/2017    Past Surgical History:  Procedure Laterality Date  . CRANIOTOMY Right 01/13/2017   Procedure: 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, Placement of bone flap in abdomen;  Surgeon: Jamie Memos, MD;  Location: [facility] OR;  Service: Neurosurgery;  Laterality: Right;  . CRANIOTOMY Right 03/19/2017   Procedure: Cranioplasty with placement of bone flap from abdomen;  Surgeon: Jamie Alert, MD;  Location: [facility] OR;  Service: Neurosurgery;  Laterality: Right;    There were no vitals filed for this visit.  Subjective Assessment - 06/30/19 1805    Subjective  Pt reports no problems or changes    Patient is accompained by:  Family member   sister interpreted due to machine being used by another therapist   Pertinent History  TBI secondary to SDH due to fall on 01-10-17; Rt craniectomy for hematoma on 01-13-17; Cranioplasty with placement of bone flap from abdomen on 03-19-17; pt received HH (few visits per sister's report) and no additional therapies due to lack of  insurance at that time;  DM type 2 with peripheral neuropathy;  seizures    Patient Stated Goals  Improve walking and balance    Currently in Pain?  No/denies                       [facility] Adult PT Treatment/Exercise - 06/30/19 0001      Ambulation/Gait   Ambulation/Gait  Yes    Ambulation/Gait Assistance  5: Supervision    Ambulation Distance (Feet)  100 Feet    Assistive device  None    Gait Pattern  Wide base of support;Decreased step length - left;Decreased step length - right    Ambulation Surface  Level;Indoor    Pre-Gait Activities  tossing ball forwards/backwards to improve multi-tasking with gait        TherAct:  Jumping on mini trampoline - bil. Feet 10 reps with min. UE support on PT's forearms  1/2 jumping jacks 10 reps Lunges 10 reps with UE support on mini-trampoline  Pt performed kicking soccer ball approx. 50' x 2 reps for improved coordination - SBA for safety  NeuroRe-ed:  Marching on incline and on decline - horizontal head turns with CGA Alternate stepping up/back and down/back 10 reps each leg   Walking down/back on mat on incline/decline with CGA   Jogging 50' x 2 reps on flat, even surface with SBA  PT Long Term Goals - 06/30/19 1812      PT LONG TERM GOAL #1   Title  Pt will increase DGI score from 16/24 to >/= 19/24  to demo improved gait and reduced fall risk.    Baseline  17/24    Time  4    Period  Weeks    Status  Revised      PT LONG TERM GOAL #3   Title  Pt will negotiate steps with 1 rail only using a step over step sequence MOD I with decreased use of UE to pull and advance COG forwards    Baseline  supervision-pulls with UE when ascending, turns sideways to descend with control    Time  4    Period  Weeks    Status  Revised      PT LONG TERM GOAL #4   Title  Increase gait velocity from 3.19  ft/sec to >/= 3.8 ft/sec for increased gait efficiency.    Baseline  3.6 ft/sec    Time  4    Period  Weeks     Status  Revised      PT LONG TERM GOAL #5   Title  Jog  200' on grassy terrain with soccer ball without LOB to enable pt to return to recreational activities.    Baseline  jogged 230' indoors, no ball    Time  4    Period  Weeks    Status  Revised      PT LONG TERM GOAL #6   Title  Independent in HEP for balance and strengthening, including returning to workouts at [employer] when facility reopens.    Time  4    Period  Weeks    Status  Revised      PT LONG TERM GOAL #7   Title  Pt wil perform SLS on each leg for at least 5 secs for increased safety with curb negotiation, lower body dressing in standing, etc.    Baseline  1 second    Time  4    Period  Weeks    Status  Revised            Plan - 06/30/19 1808    Clinical Impression Statement  Pt improving with balance on compliant and non-compliant surfaces - needs cues to perform slowly to facilitate improved SLS on each leg;  pt had some difficulty with clearing surface in jumping activity - needs UE support for balance with plyometrics    Rehab Potential  Good    Clinical Impairments Affecting Rehab Potential  language barrier and cognitive deficits - pt's sister [name] interpreted at eval    PT Frequency  2x / week    PT Duration  Other (comment)   5 weeks   PT Treatment/Interventions  ADLs/Self Care Home Management;Aquatic Therapy;Gait training;Stair training;Therapeutic activities;Therapeutic exercise;Balance training;Neuromuscular re-education;Patient/family education;Functional mobility training    PT Next Visit Plan  check LTG's and D/C next session    PT Home Exercise Plan   [id number]; added side, forward and back [facility]    Consulted and Agree with Plan of Care  Patient;Family member/caregiver    Family Member Consulted  sister [name]       Patient will benefit from skilled therapeutic intervention in order to improve the following deficits and impairments:  Abnormal gait, Decreased  activity tolerance, Decreased balance, Decreased cognition, Decreased coordination, Decreased strength, Decreased safety awareness, Difficulty walking  Visit Diagnosis:  Other abnormalities of gait and mobility  Unsteadiness on feet  Other lack of coordination     Problem List Patient Active Problem List   Diagnosis Date Noted  . Microalbuminuria due to type 2 diabetes mellitus ([place]) 04/11/2019  . Hypothyroidism 09/21/2018  . Seizure disorder ([place]) 06/16/2018  . Low testosterone 06/16/2018  . Traumatic brain injury with loss of consciousness ([place]) 10/29/2017  . Anemia 10/29/2017  . Positive depression screening 10/29/2017  . Attention and concentration deficit   . Type 2 diabetes mellitus with peripheral neuropathy (HCC)   . S/P craniotomy 03/19/2017  . Essential hypertension   . Dysphagia, oropharyngeal 02/01/2017  . Diabetes type 2, uncontrolled ([place]) 06/13/2015    Jaymes Hang, [place], PT 06/30/2019, [place] [address] [place], [place], [address] Phone: [phone]   Fax:  [phone]  Name: [name] MRN: [id number] Date of Birth: 1966/04/08

## 2019-07-04 ENCOUNTER — Ambulatory Visit: Payer: Self-pay | Attending: Physical Medicine & Rehabilitation | Admitting: Physical Therapy

## 2019-07-04 ENCOUNTER — Other Ambulatory Visit: Payer: Self-pay

## 2019-07-04 DIAGNOSIS — R2681 Unsteadiness on feet: Secondary | ICD-10-CM | POA: Insufficient documentation

## 2019-07-04 DIAGNOSIS — R2689 Other abnormalities of gait and mobility: Secondary | ICD-10-CM | POA: Insufficient documentation

## 2019-07-04 DIAGNOSIS — S069X9S Unspecified intracranial injury with loss of consciousness of unspecified duration, sequela: Secondary | ICD-10-CM

## 2019-07-04 DIAGNOSIS — M6281 Muscle weakness (generalized): Secondary | ICD-10-CM | POA: Insufficient documentation

## 2019-07-04 NOTE — Telephone Encounter (Signed)
Requesting refill Methylphenidate. Last filled 05/18/2019, next appt 07/26/2019

## 2019-07-05 MED ORDER — METHYLPHENIDATE HCL 10 MG PO TABS: 10.0000 mg | ORAL_TABLET | Freq: Two times a day (BID) | ORAL | 0 refills | Status: DC

## 2019-07-05 NOTE — Therapy (Signed)
[place] [address] [place], [place], [address] Phone: [phone]   Fax:  [phone]  Physical Therapy Treatment  Patient Details  Name: [name] MRN: [id number] Date of Birth: [birth year] Referring Provider (PT): Dr. Alger Simons   Encounter Date: 07/04/2019  PT End of Session - 07/05/19 2004    Visit Number  14    Number of Visits  14    Date for PT Re-Evaluation  07/05/19    Authorization Type  CAFA 100% thru 07-05-19    Authorization Time Period  thru 07-05-19    PT Start Time  1742    PT Stop Time  1833    PT Time Calculation (min)  51 min    Activity Tolerance  Patient tolerated treatment well    Behavior During Therapy  [facility] for tasks assessed/performed       Past Medical History:  Diagnosis Date  . Diabetes mellitus without complication ([place])   . Seizures ([place])   . TBI (traumatic brain injury) ([place]) 03/2017    Past Surgical History:  Procedure Laterality Date  . CRANIOTOMY Right 01/13/2017   Procedure: 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, Placement of bone flap in abdomen;  Surgeon: Ashok Pall, MD;  Location: [place];  Service: Neurosurgery;  Laterality: Right;  . CRANIOTOMY Right 03/19/2017   Procedure: Cranioplasty with placement of bone flap from abdomen;  Surgeon: Eustace Moore, MD;  Location: [place];  Service: Neurosurgery;  Laterality: Right;    There were no vitals filed for this visit.  Subjective Assessment - 07/05/19 2000    Subjective  pt reports he is doing well - plans to go back to gym when it reopens    Patient is accompained by:  Family member   sister interpreted due to machine being used by another therapist   Pertinent History  TBI secondary to SDH due to fall on 01-10-17; Rt craniectomy for hematoma on 01-13-17; Cranioplasty with placement of bone flap from abdomen on 03-19-17; pt received HH (few visits per sister's report) and no  additional therapies due to lack of insurance at that time;  DM type 2 with peripheral neuropathy;  seizures    Patient Stated Goals  Improve walking and balance    Currently in Pain?  No/denies                       [facility] Adult PT Treatment/Exercise - 07/05/19 0001      Ambulation/Gait   Ambulation/Gait  Yes    Ambulation/Gait Assistance  5: Supervision    Ambulation Distance (Feet)  250 Feet    Assistive device  None    Gait Pattern  Wide base of support;Decreased step length - left;Decreased step length - right    Ambulation Surface  Level;Indoor    Stairs  Yes    Stairs Assistance  5: Supervision    Stair Management Technique  One rail Left;Alternating pattern;Forwards    Number of Stairs  12    Height of Stairs  6    Curb  5: Supervision    Curb Details (indicate cue type and reason)  cues to let toes hang over the edge    Pre-Gait Activities  tossing ball forwards/bacwards for multi-tasking with gait      Timed Up and Go Test   Normal TUG (seconds)  11      High Level Balance   High Level Balance Comments  trampoline with 1 UE support up/down, in/out and staggered stance;floor ladder step over step then had pt use ball in UE's to decreased trunk compensating/over movement.  Pt with improved performance while holding ball straight overhead.  Cone taps without UE support with moderate cues to increase stance time.  Performed same with pt holding ball with improved performance;In // bars had pt do static stance with one foot on cone and trying to maintain position without UE support.  Again pt needed moderate cues to maintain stance, especially with L foot on cone.      Therapeutic Activites    Other Therapeutic Activities  Pt performed kicking soccer ball back to interpreter with RLE/LLE and then progressed to kicking soccer ball 115' around track with CGA; pt able to stop ball with Rt foot and then quickly kick    jogging 400' without rest period with cues to lift  legs high                 PT Long Term Goals - 07/04/19 1740      PT LONG TERM GOAL #1   Title  Pt will increase DGI score from 16/24 to >/= 19/24  to demo improved gait and reduced fall risk.    Baseline  18/24 - 07-04-19    Time  4    Period  Weeks    Status  Not Met      PT LONG TERM GOAL #2   Title  Improve TUG score from 15.22 secs to </= 12.5 secs without use of assistive device to demo improved functional mobility with reduced fall risk.    Baseline  11 seconds    Status  Achieved      PT LONG TERM GOAL #3   Title  Pt will negotiate steps with 1 rail only using a step over step sequence MOD I with decreased use of UE to pull and advance COG forwards    Baseline  supervision-pulls with UE when ascending, turns sideways to descend with control; met 07-04-19    Time  4    Period  Weeks    Status  Achieved      PT LONG TERM GOAL #4   Title  Increase gait velocity from 3.19  ft/sec to >/= 3.8 ft/sec for increased gait efficiency.    Baseline  3.6 ft/sec;  8.28 secs    Time  4    Period  Weeks    Status  Achieved      PT LONG TERM GOAL #5   Title  Jog  200' on grassy terrain with soccer ball without LOB to enable pt to return to recreational activities.    Baseline  jogged 230' indoors, no ball    Time  4    Period  Weeks    Status  Revised      PT LONG TERM GOAL #6   Title  Independent in HEP for balance and strengthening, including returning to workouts at [facility] when facility reopens.    Time  4    Period  Weeks    Status  Revised      PT LONG TERM GOAL #7   Title  Pt wil perform SLS on each leg for at least 5 secs for increased safety with curb negotiation, lower body dressing in standing, etc.    Baseline  1 second;   3.06 secs on LLE on 07-04-19    Time  4    Period  Weeks  Status  Revised      PT LONG TERM GOAL #8   Title  Pt will jog 200' on flat even surface with supervision.    Status  Achieved            Plan - 07/05/19 2023    Clinical  Impression Statement  Pt has met majority of LTG's - pt continues to have gait deviations with wide base and decreased step length.  Pt is not at risk for fall per TUG score of 11 secs; DGI score remains 18/24. Pt has made good progress in PT - is discharged due to completion of program and plateau in maximizing functional progress at this time.    Rehab Potential  Good    Clinical Impairments Affecting Rehab Potential  language barrier and cognitive deficits - pt's sister [name] interpreted at eval    PT Frequency  2x / week    PT Duration  Other (comment)   5 weeks   PT Treatment/Interventions  ADLs/Self Care Home Management;Aquatic Therapy;Gait training;Stair training;Therapeutic activities;Therapeutic exercise;Balance training;Neuromuscular re-education;Patient/family education;Functional mobility training    PT Next Visit Plan  check LTG's and D/C next session    PT Home Exercise Plan   [id number]; added side, forward and back kicks and braiding from [facility] on 10-27-18    Consulted and Agree with Plan of Care  Patient;Family member/caregiver    Family Member Consulted  sister [name]       Patient will benefit from skilled therapeutic intervention in order to improve the following deficits and impairments:  Abnormal gait, Decreased activity tolerance, Decreased balance, Decreased cognition, Decreased coordination, Decreased strength, Decreased safety awareness, Difficulty walking  Visit Diagnosis: Other abnormalities of gait and mobility  Unsteadiness on feet  Muscle weakness (generalized)     Problem List Patient Active Problem List   Diagnosis Date Noted  . Microalbuminuria due to type 2 diabetes mellitus ([place]) 04/11/2019  . Hypothyroidism 09/21/2018  . Seizure disorder ([place]) 06/16/2018  . Low testosterone 06/16/2018  . Traumatic brain injury with loss of consciousness ([place]) 10/29/2017  . Anemia 10/29/2017  . Positive depression screening 10/29/2017  . Attention and  concentration deficit   . Type 2 diabetes mellitus with peripheral neuropathy (HCC)   . S/P craniotomy 03/19/2017  . Essential hypertension   . Dysphagia, oropharyngeal 02/01/2017  . Diabetes type 2, uncontrolled ([place]) 06/13/2015       PHYSICAL THERAPY DISCHARGE SUMMARY  Visits from Start of Care: 14  Current functional level related to goals / functional outcomes: See above for progress towards goals   Remaining deficits: Decreased high level balance skills  Continued gait deviations including wide base and decr. Step length   Education / Equipment: Pt and caregiver have been instructed in HEP for balance and strengthening; they also plan to return to gym when it reopens for continued exercise program Plan: Patient agrees to discharge.  Patient goals were partially met. Patient is being discharged due to meeting the stated rehab goals.  ?????        Alda Lea, PT 07/05/2019, 8:27 PM  [place] [address] [place], [place], [address] Phone: [phone]   Fax:  [phone]  Name: [name] MRN: [id number] Date of Birth: 1965/12/27

## 2019-07-05 NOTE — Telephone Encounter (Signed)
Original message sent as a RX refill, converted to a Patient Call message for provider.

## 2019-07-05 NOTE — Telephone Encounter (Signed)
rx sent

## 2019-07-05 NOTE — Telephone Encounter (Signed)
Patients sister called and notified.

## 2019-07-11 ENCOUNTER — Ambulatory Visit: Payer: Self-pay | Admitting: Internal Medicine

## 2019-07-12 ENCOUNTER — Ambulatory Visit: Payer: Self-pay | Attending: Internal Medicine

## 2019-07-12 ENCOUNTER — Other Ambulatory Visit: Payer: Self-pay

## 2019-07-14 ENCOUNTER — Ambulatory Visit: Payer: Self-pay | Attending: Internal Medicine | Admitting: Internal Medicine

## 2019-07-14 ENCOUNTER — Ambulatory Visit (HOSPITAL_BASED_OUTPATIENT_CLINIC_OR_DEPARTMENT_OTHER): Payer: Self-pay | Admitting: Pharmacist

## 2019-07-14 ENCOUNTER — Encounter: Payer: Self-pay | Admitting: Internal Medicine

## 2019-07-14 ENCOUNTER — Other Ambulatory Visit: Payer: Self-pay

## 2019-07-14 ENCOUNTER — Telehealth: Payer: Self-pay | Admitting: Internal Medicine

## 2019-07-14 VITALS — BP 134/80 | HR 85 | Temp 98.3°F | Resp 16 | Wt 136.0 lb

## 2019-07-14 DIAGNOSIS — Z87820 Personal history of traumatic brain injury: Secondary | ICD-10-CM

## 2019-07-14 DIAGNOSIS — Z730 Burn-out: Secondary | ICD-10-CM

## 2019-07-14 DIAGNOSIS — G40909 Epilepsy, unspecified, not intractable, without status epilepticus: Secondary | ICD-10-CM

## 2019-07-14 DIAGNOSIS — Z23 Encounter for immunization: Secondary | ICD-10-CM

## 2019-07-14 DIAGNOSIS — E119 Type 2 diabetes mellitus without complications: Secondary | ICD-10-CM

## 2019-07-14 LAB — GLUCOSE, POCT (MANUAL RESULT ENTRY): POC Glucose: 102 mg/dl — AB (ref 70–99)

## 2019-07-14 NOTE — Progress Notes (Signed)
Patient ID: [name], male    DOB: Oct 21, 1966  MRN: [id number]  CC: Diabetes and Hypertension   Subjective: [name] is a 53 y.o. male who presents for chronic ds management.  Sister [name] is with him and interprets His concerns today include:  Pt with history of diabetes type 2 with microalbumin (on Lisinopril for this),HTN, HL, low testosterone, grand-mal szand craniotomy due tointracranial bleed, cranial fractures and subdural hematomas2018.  Pt completed PT 1-2 wks ago.  Did well but sister says if he does not practice he forgets.  Sister tries to remind him to go up and down the stairs several times a day.  She also sets his phone alarm to remind him to wake up and take a shower every morning but pt does not initiate doing anything for himself.  He is independent in clothing and bathing himself and eating.  She is feeling burnt out  and worries what will happen to him should anything happen to her.  She does not have any other siblings who can help in taking care of him.  DM: [name] reports that the patient is doing well.  Blood sugars have been less than 130.  He takes metformin.  Patient loves snacking on sweets but she limits him to just 1 cookie a day.  She would like to get back to the [facility] to exercise a few times a week.  However she was told that he would need a letter from his doctor stating that it is okay for him to return to the [facility] to exercise.  Seizure disorder: No seizures since last visit.  Sister make sure that he gets his medication.  Patient Active Problem List   Diagnosis Date Noted  . Influenza vaccine needed 07/14/2019  . Microalbuminuria due to type 2 diabetes mellitus ([place]) 04/11/2019  . Hypothyroidism 09/21/2018  . Seizure disorder ([place]) 06/16/2018  . Low testosterone 06/16/2018  . Traumatic brain injury with loss of consciousness ([place]) 10/29/2017  . Anemia 10/29/2017  . Positive depression screening 10/29/2017  .  Attention and concentration deficit   . Type 2 diabetes mellitus with peripheral neuropathy (HCC)   . S/P craniotomy 03/19/2017  . Essential hypertension   . Dysphagia, oropharyngeal 02/01/2017  . Diabetes type 2, uncontrolled ([place]) 06/13/2015     Current Outpatient Medications on File Prior to Visit  Medication Sig Dispense Refill  . atorvastatin (LIPITOR) 10 MG tablet TAKE 1 TABLET (10 MG TOTAL) BY MOUTH DAILY. 90 tablet 0  . Blood Glucose Monitoring Suppl (TRUE METRIX METER) w/Device KIT Use as directed 1 kit 0  . cetirizine (ZYRTEC) 10 MG tablet Take 1 tablet (10 mg total) by mouth daily. 30 tablet 11  . glucose blood test strip Use as instructed 100 each 12  . levETIRAcetam (KEPPRA) 500 MG tablet Take 1 tablet (500 mg total) by mouth 2 (two) times daily. 180 tablet 3  . lisinopril (ZESTRIL) 2.5 MG tablet Take 1 tablet (2.5 mg total) by mouth daily. 30 tablet 6  . metFORMIN (GLUCOPHAGE) 1000 MG tablet TAKE 1 TABLET BY MOUTH 2 TIMES DAILY WITH A MEAL. 180 tablet 0  . methylphenidate (RITALIN) 10 MG tablet Take 1 tablet (10 mg total) by mouth 2 (two) times daily with breakfast and lunch. At 0700 and 1200 daily 60 tablet 0  . testosterone cypionate (DEPOTESTOSTERONE CYPIONATE) 200 MG/ML injection Inject 1 mL (200 mg total) into the muscle every 14 (fourteen) days. 2 mL 5  . TRUEPLUS LANCETS  28G MISC Use as directed 100 each 6   No current facility-administered medications on file prior to visit.     Allergies  Allergen Reactions  . Latex Itching and Rash    Social History   Socioeconomic History  . Marital status: Single    Spouse name: Not on file  . Number of children: 1  . Years of education: 77  . Highest education level: High school graduate  Occupational History  . 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 Financial resource strain: Not on file  . Food insecurity    Worry: Not on file    Inability: Not on file  . Transportation needs    Medical: Not on file     Non-medical: Not on file  Tobacco Use  . Smoking status: Never Smoker  . Smokeless tobacco: Never Used  Substance and Sexual Activity  . Alcohol use: Yes    Comment: socially, not since accident  . Drug use: No  . Sexual activity: Not on file  Lifestyle  . Physical activity    Days per week: Not on file    Minutes per session: Not on file  . Stress: Not on file  Relationships  . Social Herbalist on phone: Not on file    Gets together: Not on file    Attends religious service: Not on file    Active member of club or organization: Not on file    Attends meetings of clubs or organizations: Not on file    Relationship status: Not on file  . Intimate partner violence    Fear of current or ex partner: Not on file    Emotionally abused: Not on file    Physically abused: Not on file    Forced sexual activity: Not on file  Other Topics Concern  . Not on file  Social History Narrative   Lives with sister and her family.  Has one daughter in [place].  Does not work.  Education: high school.     Family History  Problem Relation Age of Onset  . Diabetes Mother   . Diabetes Father     Past Surgical History:  Procedure Laterality Date  . CRANIOTOMY Right 01/13/2017   Procedure: 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, Placement of bone flap in abdomen;  Surgeon: Ashok Pall, MD;  Location: [place];  Service: Neurosurgery;  Laterality: Right;  . CRANIOTOMY Right 03/19/2017   Procedure: Cranioplasty with placement of bone flap from abdomen;  Surgeon: Eustace Moore, MD;  Location: [place];  Service: Neurosurgery;  Laterality: Right;    ROS: Review of Systems Negative except as stated above  PHYSICAL EXAM: BP 134/80   Pulse 85   Temp 98.3 F (36.8 C) (Oral)   Resp 16   Wt 136 lb (61.7 kg)   SpO2 98%   BMI 24.87 kg/m   Wt Readings from Last 3 Encounters:  07/14/19 136 lb (61.7 kg)  02/28/19 141 lb (64 kg)  01/25/19 144 lb (65.3 kg)    Physical Exam  General appearance - alert, well appearing, and in no distress Mental status -patient follows commands appropriately.  He is not sure of today's date or the year without help from his sister. Mouth - mucous membranes moist, pharynx normal without lesions Neck - supple, no significant adenopathy Chest - clear to auscultation, no wheezes, rales or rhonchi, symmetric air entry Heart - normal rate, regular rhythm, normal S1, S2, no murmurs,  rubs, clicks or gallops Neurological -weakness in the left upper extremity of 4/5.  Right upper extremity power is 5/5.  Left lower extremity 4/5.  Right lower extremity 5/5.  He ambulates unassisted Extremities - peripheral pulses normal, no pedal edema, no clubbing or cyanosis Diabetic Foot Exam - Simple   Simple Foot Form Visual Inspection See comments: Yes Sensation Testing Intact to touch and monofilament testing bilaterally: Yes Pulse Check Posterior Tibialis and Dorsalis pulse intact bilaterally: Yes Comments Toenails are thick and slightly overgrown.     CMP Latest Ref Rng & Units 04/20/2019 06/16/2018 05/10/2018  Glucose 65 - 99 mg/dL 115(H) - 154(H)  BUN 6 - 24 mg/dL 16 - 14  Creatinine 0.76 - 1.27 mg/dL 0.77 - 0.91  Sodium 134 - 144 mmol/L 139 - 138  Potassium 3.5 - 5.2 mmol/L 4.1 - 3.8  Chloride 96 - 106 mmol/L 103 - 103  CO2 20 - 29 mmol/L 24 - 26  Calcium 8.7 - 10.2 mg/dL 8.4(L) - 8.7(L)  Total Protein 6.0 - 8.5 g/dL 6.6 7.0 -  Total Bilirubin 0.0 - 1.2 mg/dL 0.6 0.5 -  Alkaline Phos 39 - 117 IU/L 47 54 -  AST 0 - 40 IU/L 15 11 -  ALT 0 - 44 IU/L 22 15 -   Lipid Panel     Component Value Date/Time   CHOL 143 04/20/2019 0914   TRIG 81 04/20/2019 0914   HDL 54 04/20/2019 0914   CHOLHDL 2.6 04/20/2019 0914   CHOLHDL 5.2 (H) 06/02/2016 1232   VLDL NOT CALC 06/02/2016 1232   LDLCALC 73 04/20/2019 0914    CBC    Component Value Date/Time   WBC 4.6 04/20/2019 0914   WBC 5.5 05/10/2018 1849   RBC 4.56 04/20/2019  0914   RBC 3.96 (L) 05/10/2018 1849   HGB 14.1 04/20/2019 0914   HCT 40.2 04/20/2019 0914   PLT 229 04/20/2019 0914   MCV 88 04/20/2019 0914   MCH 30.9 04/20/2019 0914   MCH 30.6 05/10/2018 1849   MCHC 35.1 04/20/2019 0914   MCHC 35.6 05/10/2018 1849   RDW 12.8 04/20/2019 0914   LYMPHSABS 1.1 05/10/2018 1849   LYMPHSABS 1.4 04/01/2017 1444   MONOABS 0.3 05/10/2018 1849   EOSABS 0.1 05/10/2018 1849   EOSABS 0.1 04/01/2017 1444   BASOSABS 0.0 05/10/2018 1849   BASOSABS 0.0 04/01/2017 1444   Results for orders placed or performed in visit on 07/14/19  Fecal occult blood, imunochemical  Result Value Ref Range   IFOBT Negative   POCT glucose (manual entry)  Result Value Ref Range   POC Glucose 102 (A) 70 - 99 mg/dl    ASSESSMENT AND PLAN: 1. History of traumatic brain injury Advised his sister that given his head injury he most likely lacks the ability to learn and continue to put into action things that he has learned.  This explains the reason that he benefits from physical therapy when doing it but is not able to carry through on what he has learned.  I think her understanding of that may help decrease some of the frustration she feels in caring for him. -Discussed the importance of trying to have daily routines set out for him and try to get other family members by in and to helping to care for him so that she does not struggle and feel burnt out -In terms of eye exam, sister states that she took him before but he was unable to follow commands in terms  of keeping his eyes open so that they were able to look into the eyes.  2. Burnout of caregiver See #1 above  3. Controlled type 2 diabetes mellitus without complication, without long-term current use of insulin (HCC) Controlled.  Encouraged caregiver to continue monitoring his eating habits and making sure that he is not eating sugary snacks or sugary drinks - POCT glucose (manual entry)  4. Influenza vaccine needed   5.  Seizure disorder ([place]) Stable on Keppra    Patient was given the opportunity to ask questions.  Patient verbalized understanding of the plan and was able to repeat key elements of the plan.   Orders Placed This Encounter  Procedures  . Fecal occult blood, imunochemical  . POCT glucose (manual entry)     Requested Prescriptions    No prescriptions requested or ordered in this encounter    Return in about 4 months (around 11/13/2019).  Karle Plumber, MD, FACP

## 2019-07-14 NOTE — Telephone Encounter (Signed)
Patient wanted to get an update on his cafa. Please follow up

## 2019-07-14 NOTE — Telephone Encounter (Signed)
Pt was inform that he is approve for CAFA with the 100%

## 2019-07-14 NOTE — Patient Instructions (Addendum)
Influenza Virus Vaccine injection (Fluarix) °¿Qué es este medicamento? °La VACUNA ANTIGRIPAL ayuda a disminuir el riesgo de contraer la influenza, también conocida como la gripe. La vacuna solo ayuda a protegerle contra algunas cepas de influenza. Esta vacuna no ayuda a reducir el riesgo de contraer influenza pandémica H1N1. °Este medicamento puede ser utilizado para otros usos; si tiene alguna pregunta consulte con su proveedor de atención médica o con su farmacéutico. °MARCAS COMUNES: Fluarix, Fluzone °¿Qué le debo informar a mi profesional de la salud antes de tomar este medicamento? °Necesita saber si usted presenta alguno de los siguientes problemas o situaciones: °· trastorno de sangrado como hemofilia °· fiebre o infección °· síndrome de Guillain-Barre u otros problemas neurológicos °· problemas del sistema inmunológico °· infección por el virus de la inmunodeficiencia humana (VIH) o SIDA °· niveles bajos de plaquetas en la sangre °· esclerosis múltiple °· una reacción alérgica o inusual a las vacunas antigripales, a los huevos, proteínas de pollo, al látex, a la gentamicina, a otros medicamentos, alimentos, colorantes o conservantes °· si está embarazada o buscando quedar embarazada °· si está amamantando a un bebé °¿Cómo debo utilizar este medicamento? °Esta vacuna se administra mediante inyección por vía intramuscular. Lo administra un profesional de la salud. °Recibirá una copia de información escrita sobre la vacuna antes de cada vacuna. Asegúrese de leer este folleto cada vez cuidadosamente. Este folleto puede cambiar con frecuencia. °Hable con su pediatra para informarse acerca del uso de este medicamento en niños. Puede requerir atención especial. °Sobredosis: Póngase en contacto inmediatamente con un centro toxicológico o una sala de urgencia si usted cree que haya tomado demasiado medicamento. °ATENCIÓN: Este medicamento es solo para usted. No comparta este medicamento con nadie. °¿Qué sucede si me  olvido de una dosis? °No se aplica en este caso. °¿Qué puede interactuar con este medicamento? °· quimioterapia o radioterapia °· medicamentos que suprimen el sistema inmunológico, tales como etanercept, anakinra, infliximab y adalimumab °· medicamentos que tratan o previenen coágulos sanguíneos, como warfarina °· fenitoína °· medicamentos esteroideos, como la prednisona o la cortisona °· teofilina °· vacunas °Puede ser que esta lista no menciona todas las posibles interacciones. Informe a su profesional de la salud de todos los productos a base de hierbas, medicamentos de venta libre o suplementos nutritivos que esté tomando. Si usted fuma, consume bebidas alcohólicas o si utiliza drogas ilegales, indíqueselo también a su profesional de la salud. Algunas sustancias pueden interactuar con su medicamento. °¿A qué debo estar atento al usar este medicamento? °Informe a su médico o a su profesional de la salud sobre todos los efectos secundarios que persistan después de 3 días. Llame a su proveedor de atención médica si se presentan síntomas inusuales dentro de las 6 semanas posteriores a la vacunación. °Es posible que todavía pueda contraer la gripe, pero la enfermedad no será tan fuerte como normalmente. No puede contraer la gripe de esta vacuna. La vacuna antigripal no le protege contra resfríos u otras enfermedades que pueden causar fiebre. Debe vacunarse cada año. °¿Qué efectos secundarios puedo tener al utilizar este medicamento? °Efectos secundarios que debe informar a su médico o a su profesional de la salud tan pronto como sea posible: °· reacciones alérgicas como erupción cutánea, picazón o urticarias, hinchazón de la cara, labios o lengua °Efectos secundarios que, por lo general, no requieren atención médica (debe informarlos a su médico o a su profesional de la salud si persisten o si son molestos): °· fiebre °· dolor de cabeza °· molestias y dolores musculares °·   dolor, sensibilidad, enrojecimiento o  hinchazón en el lugar de la inyección °· cansancio o debilidad °Puede ser que esta lista no menciona todos los posibles efectos secundarios. Comuníquese a su médico por asesoramiento médico sobre los efectos secundarios. Usted puede informar los efectos secundarios a la FDA por teléfono al 1-800-FDA-1088. °¿Dónde debo guardar mi medicina? °Esta vacuna se administra solamente en clínicas, farmacias, consultorio médico u otro consultorio de un profesional de la salud y no necesitará guardarlo en su domicilio. °ATENCIÓN: Este folleto es un resumen. Puede ser que no cubra toda la posible información. Si usted tiene preguntas acerca de esta medicina, consulte con su médico, su farmacéutico o su profesional de la salud. °© 2020 Elsevier/Gold Standard (2010-04-22 15:31:40) ° °

## 2019-07-14 NOTE — Progress Notes (Signed)
Patient presents for vaccination against influenza per orders of Dr. Johnson. Consent given. Counseling provided. No contraindications exists. Vaccine administered without incident.   

## 2019-07-25 ENCOUNTER — Ambulatory Visit: Payer: Self-pay | Admitting: Physical Medicine & Rehabilitation

## 2019-07-26 ENCOUNTER — Ambulatory Visit: Payer: Self-pay | Admitting: Physical Medicine & Rehabilitation

## 2019-08-03 MED FILL — LISINOPRIL 2.5 MG TABLET: 2.5 | 30 days supply | Qty: 30 | Fill #3

## 2019-08-03 MED FILL — ATORVASTATIN 10 MG TABLET: 10 | 30 days supply | Qty: 30 | Fill #0

## 2019-08-03 MED FILL — metFORMIN HCL 1000 MG TABS: 1000 | 30 days supply | Qty: 60 | Fill #2

## 2019-08-03 MED FILL — levETIRAcetam 500 MG TABS: 500 | 30 days supply | Qty: 60 | Fill #4

## 2019-08-30 ENCOUNTER — Encounter: Payer: Self-pay | Attending: Physical Medicine & Rehabilitation | Admitting: Physical Medicine & Rehabilitation

## 2019-08-30 ENCOUNTER — Other Ambulatory Visit: Payer: Self-pay

## 2019-08-30 ENCOUNTER — Encounter: Payer: Self-pay | Admitting: Physical Medicine & Rehabilitation

## 2019-08-30 VITALS — BP 146/79 | HR 75 | Temp 97.7°F | Ht 63.0 in | Wt 141.0 lb

## 2019-08-30 DIAGNOSIS — S069X9S Unspecified intracranial injury with loss of consciousness of unspecified duration, sequela: Secondary | ICD-10-CM | POA: Insufficient documentation

## 2019-08-30 DIAGNOSIS — R7989 Other specified abnormal findings of blood chemistry: Secondary | ICD-10-CM | POA: Insufficient documentation

## 2019-08-30 DIAGNOSIS — E119 Type 2 diabetes mellitus without complications: Secondary | ICD-10-CM | POA: Insufficient documentation

## 2019-08-30 DIAGNOSIS — G40909 Epilepsy, unspecified, not intractable, without status epilepticus: Secondary | ICD-10-CM | POA: Insufficient documentation

## 2019-08-30 DIAGNOSIS — I10 Essential (primary) hypertension: Secondary | ICD-10-CM | POA: Insufficient documentation

## 2019-08-30 DIAGNOSIS — E1129 Type 2 diabetes mellitus with other diabetic kidney complication: Secondary | ICD-10-CM | POA: Insufficient documentation

## 2019-08-30 DIAGNOSIS — R809 Proteinuria, unspecified: Secondary | ICD-10-CM | POA: Insufficient documentation

## 2019-08-30 DIAGNOSIS — Z79899 Other long term (current) drug therapy: Secondary | ICD-10-CM | POA: Insufficient documentation

## 2019-08-30 DIAGNOSIS — Z5181 Encounter for therapeutic drug level monitoring: Secondary | ICD-10-CM | POA: Insufficient documentation

## 2019-08-30 MED ORDER — METHYLPHENIDATE HCL 10 MG PO TABS: 10.0000 mg | ORAL_TABLET | Freq: Two times a day (BID) | ORAL | 0 refills | Status: DC

## 2019-08-30 MED ORDER — LEVETIRACETAM 500 MG PO TABS: 500.0000 mg | ORAL_TABLET | Freq: Two times a day (BID) | ORAL | 8 refills | Status: DC

## 2019-08-30 MED ORDER — METFORMIN HCL 1000 MG PO TABS: ORAL_TABLET | ORAL | 6 refills | Status: DC

## 2019-08-30 MED ORDER — TESTOSTERONE CYPIONATE 200 MG/ML IM SOLN: 200.0000 mg | INTRAMUSCULAR | 5 refills | Status: DC

## 2019-08-30 MED ORDER — LISINOPRIL 2.5 MG PO TABS: 2.5000 mg | ORAL_TABLET | Freq: Every day | ORAL | 6 refills | Status: DC

## 2019-08-30 MED ORDER — CETIRIZINE HCL 10 MG PO TABS: 10.0000 mg | ORAL_TABLET | Freq: Every day | ORAL | 11 refills | Status: DC

## 2019-08-30 MED ORDER — ATORVASTATIN CALCIUM 10 MG PO TABS: 10.0000 mg | ORAL_TABLET | Freq: Every day | ORAL | 5 refills | Status: DC

## 2019-08-30 MED FILL — metFORMIN HCL 1000 MG TABS: 1000 | 30 days supply | Qty: 60 | Fill #0

## 2019-08-30 MED FILL — ?ATORVASTATIN 10 MG TABLET: 10 | 30 days supply | Qty: 30 | Fill #0

## 2019-08-30 MED FILL — ?CETIRIZINE HCL 10 MG TABLE: 10 | 30 days supply | Qty: 30 | Fill #0

## 2019-08-30 MED FILL — LISINOPRIL 2.5 MG TABLET: 2.5 | 30 days supply | Qty: 30 | Fill #0

## 2019-08-30 MED FILL — levETIRAcetam 500 MG TABS: 500 | 30 days supply | Qty: 60 | Fill #0

## 2019-08-30 NOTE — Patient Instructions (Signed)
LET'S KEEP A WEEKLY CALENDAR OF THINGS THAT YOU NEED TO DO EACH DAY.   CHECK OFF THE ACTIVITIES ONCE YOU HAVE COMPLETED THEM

## 2019-08-30 NOTE — Progress Notes (Signed)
Subjective:    Patient ID: [name], male    DOB: Mar 02, 1966, 53 y.o.   MRN: [id number]  HPI   Claudy is here in follow-up of his traumatic brain injury.  For the most part things have remained fairly steady.  He still needs cues for memory and follow-through.  He does stay on somewhat of a schedule that his wife keeps him on track with.  She feels that the Ritalin has helped with his attention and arousal.  She sometimes thinks that the 10 mg gets him too excited but for the most part is beneficial for him.  His sleep schedule is fairly routine although some mornings he wakes up earlier than others.  Mood has been fairly positive.  He is able to perform tasks around the house such as simple cleanup, making the beds, taking out the trash etc. but typically always needs cues for this.  Pain Inventory Average Pain 0 Pain Right Now 0 My pain is na  In the last 24 hours, has pain interfered with the following? General activity 0 Relation with others 0 Enjoyment of life 0 What TIME of day is your pain at its worst? na Sleep (in general) Good  Pain is worse with: na Pain improves with: na Relief from Meds: na  Mobility walk without assistance ability to climb steps?  yes do you drive?  no  Function not employed: date last employed . I need assistance with the following:  meal prep and shopping  Neuro/Psych No problems in this area trouble walking  Prior Studies Any changes since last visit?  no  Physicians involved in your care Any changes since last visit?  no   Family History  Problem Relation Age of Onset  . Diabetes Mother   . Diabetes Father    Social History   Socioeconomic History  . Marital status: Single    Spouse name: Not on file  . Number of children: 1  . Years of education: 59  . Highest education level: High school graduate  Occupational History  . 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 Financial resource strain: Not on file   . Food insecurity    Worry: Not on file    Inability: Not on file  . Transportation needs    Medical: Not on file    Non-medical: Not on file  Tobacco Use  . Smoking status: Never Smoker  . Smokeless tobacco: Never Used  Substance and Sexual Activity  . Alcohol use: Yes    Comment: socially, not since accident  . Drug use: No  . Sexual activity: Not on file  Lifestyle  . Physical activity    Days per week: Not on file    Minutes per session: Not on file  . Stress: Not on file  Relationships  . Social Musician on phone: Not on file    Gets together: Not on file    Attends religious service: Not on file    Active member of club or organization: Not on file    Attends meetings of clubs or organizations: Not on file    Relationship status: Not on file  Other Topics Concern  . Not on file  Social History Narrative   Lives with sister and her family.  Has one daughter in [place].  Does not work.  Education: high school.    Past Surgical History:  Procedure Laterality Date  . CRANIOTOMY Right 01/13/2017   Procedure: RIGHT  FRONTO TEMPORAL PARIETAL CRANIECTOMY FOR HEMATOMA EVACUATION, Placement of bone flap in abdomen;  Surgeon: Ashok Pall, MD;  Location: [place];  Service: Neurosurgery;  Laterality: Right;  . CRANIOTOMY Right 03/19/2017   Procedure: Cranioplasty with placement of bone flap from abdomen;  Surgeon: Eustace Moore, MD;  Location: [place];  Service: Neurosurgery;  Laterality: Right;   Past Medical History:  Diagnosis Date  . Diabetes mellitus without complication ([place])   . Seizures ([place])   . TBI (traumatic brain injury) ([place]) 03/2017   BP (!) 146/79   Pulse 75   Temp 97.7 F (36.5 C)   Ht 5\' 3"  (1.6 m)   Wt 141 lb (64 kg)   SpO2 98%   BMI 24.98 kg/m   Opioid Risk Score:   Fall Risk Score:  `1  Depression screen PHQ 2/9  Depression screen [facility] 2/9 07/14/2019 12/13/2018 09/21/2018 07/28/2018 06/16/2018 03/04/2018 10/29/2017  Decreased Interest 2 0 3 1  0 2 2  Down, Depressed, Hopeless 2 0 0 0 0 0 0  PHQ - 2 Score 4 0 3 1 0 2 2  Altered sleeping 2 - 0 1 - 1 3  Tired, decreased energy 1 - 1 2 - 0 0  Change in appetite 0 - 0 2 - 2 0  Feeling bad or failure about yourself  2 - 0 0 - 0 0  Trouble concentrating 1 - 1 0 - 0 2  Moving slowly or fidgety/restless 1 - 1 1 - 0 0  Suicidal thoughts 0 - 0 0 - 0 0  PHQ-9 Score 11 - 6 7 - 5 7  Some recent data might be hidden     Review of Systems  Constitutional: Negative.   HENT: Negative.   Eyes: Negative.   Respiratory: Negative.   Cardiovascular: Negative.   Gastrointestinal: Negative.   Endocrine: Negative.   Genitourinary: Negative.   Musculoskeletal: Positive for gait problem.  Skin: Negative.   Allergic/Immunologic: Negative.   Hematological: Negative.   Psychiatric/Behavioral: Negative.   All other systems reviewed and are negative.      Objective:   Physical Exam General: No acute distress HEENT: EOMI, oral membranes moist Cards: reg rate  Chest: normal effort Abdomen: Soft, NT, ND Skin: dry, intact Extremities: no edema  Musculoskeletal: He exhibits no edema or tenderness.  Neurological: He is alert and more attentive. Motor: 5/5in all fours. slow processing. Improved attention and insight.  Gait pattern stable. Skin: Skin is warm and dry.  Psychiatric:Bright and interactive.    Assessment & Plan:  1. Decreased functional mobility secondary to right intraparenchymal hemorrhage-SDH-temporal bone fracture after fall. Status post right frontotemporoparietal craniectomy 01/14/2017 with cranioplasty 03/19/2017 -continue HEP -maintain ritalin, can try taking 1/2 tab (5mg ) in PM          -10mg  #60 -Provided second Ritalin prescription for next month        -We will continue the controlled substance monitoring program, this consists of regular clinic visits, examinations, routine drug screening, pill counts as well as use of [place] Controlled Substance Reporting System. [place] was [place] today.          -discussed use of a weekly calendar to improve his functional memory and cognition       -UDS today  -Patient may call for refills of his methylphenidate in about 2 months time. 2. Seizure prophylaxis.keppra 500 mg twice daily. 3. Diabetes mellitus peripheral neuropathy.Per primary. 4. Hypertension. bp controlled off meds 5. Fatigue: -standard  bedtime -testosterone  200 mg every 2 weeks.    -check levels today -pt denies depression  -Continue Ritalin as above   50 minutes of direct patient time was spent in the office today.  I will see him back in about 4 months.

## 2019-09-02 LAB — TOXASSURE SELECT,+ANTIDEPR,UR

## 2019-09-05 ENCOUNTER — Telehealth: Payer: Self-pay | Admitting: *Deleted

## 2019-09-05 NOTE — Telephone Encounter (Signed)
Mr [name] drug screen is negative for medication.  I have no documentation of when he last took medication to verify whether the test is consistent or inconsistent. It appears to be inconsistent because they are filling the prescriptions per the PMP,  and this does not verify he is taking it.

## 2019-10-05 MED FILL — LISINOPRIL 2.5 MG TABLET: 2.5 | 30 days supply | Qty: 30 | Fill #1

## 2019-10-05 MED FILL — levETIRAcetam 500 MG TABS: 500 | 30 days supply | Qty: 60 | Fill #1

## 2019-10-05 MED FILL — ?ATORVASTATIN 10 MG TABLET: 10 | 30 days supply | Qty: 30 | Fill #1

## 2019-10-05 MED FILL — metFORMIN HCL 1000 MG TABS: 1000 | 30 days supply | Qty: 60 | Fill #1

## 2019-10-31 ENCOUNTER — Encounter: Payer: Self-pay | Admitting: Neurology

## 2019-10-31 ENCOUNTER — Other Ambulatory Visit: Payer: Self-pay

## 2019-10-31 ENCOUNTER — Telehealth (INDEPENDENT_AMBULATORY_CARE_PROVIDER_SITE_OTHER): Payer: Self-pay | Admitting: Neurology

## 2019-10-31 VITALS — BP 115/74 | HR 104 | Ht 62.0 in | Wt 145.0 lb

## 2019-10-31 DIAGNOSIS — G40909 Epilepsy, unspecified, not intractable, without status epilepticus: Secondary | ICD-10-CM

## 2019-10-31 DIAGNOSIS — G40209 Localization-related (focal) (partial) symptomatic epilepsy and epileptic syndromes with complex partial seizures, not intractable, without status epilepticus: Secondary | ICD-10-CM

## 2019-10-31 DIAGNOSIS — Z87820 Personal history of traumatic brain injury: Secondary | ICD-10-CM

## 2019-10-31 DIAGNOSIS — R410 Disorientation, unspecified: Secondary | ICD-10-CM
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
## 2019-11-01 ENCOUNTER — Ambulatory Visit (INDEPENDENT_AMBULATORY_CARE_PROVIDER_SITE_OTHER): Payer: Self-pay | Admitting: Neurology

## 2019-11-01 DIAGNOSIS — Z87820 Personal history of traumatic brain injury: Secondary | ICD-10-CM

## 2019-11-01 DIAGNOSIS — G40209 Localization-related (focal) (partial) symptomatic epilepsy and epileptic syndromes with complex partial seizures, not intractable, without status epilepticus: Secondary | ICD-10-CM

## 2019-11-01 DIAGNOSIS — R410 Disorientation, unspecified: Secondary | ICD-10-CM

## 2019-11-02 MED ORDER — LEVETIRACETAM 500 MG PO TABS: ORAL_TABLET | ORAL | 3 refills | Status: DC

## 2019-11-02 MED FILL — levETIRAcetam 500 MG TABS: 500 | 30 days supply | Qty: 90 | Fill #0

## 2019-11-08 ENCOUNTER — Ambulatory Visit
Admission: RE | Admit: 2019-11-08 | Discharge: 2019-11-08 | Disposition: A | Discharge status: Home / Self Care | Payer: No Typology Code available for payment source | Source: Ambulatory Visit | Attending: Neurology | Admitting: Neurology

## 2019-11-08 DIAGNOSIS — G40209 Localization-related (focal) (partial) symptomatic epilepsy and epileptic syndromes with complex partial seizures, not intractable, without status epilepticus: Secondary | ICD-10-CM

## 2019-11-08 DIAGNOSIS — R410 Disorientation, unspecified: Secondary | ICD-10-CM

## 2019-11-08 DIAGNOSIS — Z87820 Personal history of traumatic brain injury: Secondary | ICD-10-CM

## 2019-11-08 MED FILL — LISINOPRIL 2.5 MG TABLET: 2.5 | 30 days supply | Qty: 30 | Fill #2

## 2019-11-08 MED FILL — metFORMIN HCL 1000 MG TABS: 1000 | 30 days supply | Qty: 60 | Fill #2

## 2019-11-08 MED FILL — ?ATORVASTATIN 10 MG TABLET: 10 | 30 days supply | Qty: 30 | Fill #2

## 2019-11-09 ENCOUNTER — Telehealth: Payer: Self-pay

## 2019-11-09 NOTE — Telephone Encounter (Signed)
-----   Message from Van Clines, MD sent at 11/08/2019  4:00 PM EST ----- Pls let sister know that the head CT did not show any evidence of new bleed, no new changes seen. We will see how he is doing with the higher dose of Keppra, but the changes she is noticing may be due to depression as well.

## 2019-11-09 NOTE — Telephone Encounter (Signed)
Pt's sister informed of results. She is trying to keep pt active and motivated during the day to help with depression. She believes pt is doing well with Keppra.

## 2019-11-28 NOTE — Procedures (Signed)
ELECTROENCEPHALOGRAM REPORT  Date of Study: 11/01/2019  Patient's Name: [name] MRN: [id number] Date of Birth: 1966/01/22  Referring Provider: Dr. Patrcia Dolly  Clinical History: This is a 54 year old man with a history of significant TBI with right frontal, temporal, and left superior temporal encephalomacia, seizures, with confusion, altered mental status  Medications: Keppra Glucophage Testosterone Zyrtec Technical Summary: A multichannel digital EEG recording measured by the international 10-20 system with electrodes applied with paste and impedances below 5000 ohms performed in our laboratory with EKG monitoring in an awake and drowsy patient.  Hyperventilation was not performed. Photic stimulation was performed.  The digital EEG was referentially recorded, reformatted, and digitally filtered in a variety of bipolar and referential montages for optimal display.    Description: The patient is awake and drowsy during the recording.  During maximal wakefulness, there is a symmetric, medium voltage 8 Hz posterior dominant rhythm that attenuates with eye opening.  There is focal polymorphic 4-5 Hz theta slowing over the right posterior temporal region. During drowsiness, there is an increase in theta slowing of the background. Sleep was not captured. Photic stimulation did not elicit any abnormalities.  There were no epileptiform discharges or electrographic seizures seen.    EKG lead was unremarkable.  Impression: This awake and asleep EEG is abnormal due to occasional focal slowing over the right posterior temporal region.  Clinical Correlation of the above findings indicates focal cerebral dysfunction over the right posterior temporal region suggestive of underlying structural or physiologic abnormality. The absence of epileptiform discharges does not exclude a clinical diagnosis of epilepsy. Clinical correlation is advised.   Patrcia Dolly, M.D.

## 2019-12-27 ENCOUNTER — Ambulatory Visit: Payer: Self-pay | Admitting: Physical Medicine & Rehabilitation

## 2020-01-02 ENCOUNTER — Encounter (HOSPITAL_COMMUNITY): Payer: Self-pay | Admitting: Emergency Medicine

## 2020-01-02 ENCOUNTER — Emergency Department (HOSPITAL_COMMUNITY)
Admission: EM | Admit: 2020-01-02 | Discharge: 2020-01-04 | Disposition: A | Discharge status: Other Acute Inpatient Hospital | Payer: No Typology Code available for payment source | Attending: Emergency Medicine | Admitting: Emergency Medicine

## 2020-01-02 DIAGNOSIS — Z87820 Personal history of traumatic brain injury: Secondary | ICD-10-CM | POA: Insufficient documentation

## 2020-01-02 DIAGNOSIS — R45851 Suicidal ideations: Secondary | ICD-10-CM | POA: Insufficient documentation

## 2020-01-02 DIAGNOSIS — F332 Major depressive disorder, recurrent severe without psychotic features: Secondary | ICD-10-CM | POA: Insufficient documentation

## 2020-01-02 DIAGNOSIS — S069X0S Unspecified intracranial injury without loss of consciousness, sequela: Secondary | ICD-10-CM

## 2020-01-02 DIAGNOSIS — R45850 Homicidal ideations: Secondary | ICD-10-CM

## 2020-01-02 DIAGNOSIS — E039 Hypothyroidism, unspecified: Secondary | ICD-10-CM | POA: Insufficient documentation

## 2020-01-02 DIAGNOSIS — I10 Essential (primary) hypertension: Secondary | ICD-10-CM | POA: Insufficient documentation

## 2020-01-02 DIAGNOSIS — Z79899 Other long term (current) drug therapy: Secondary | ICD-10-CM | POA: Insufficient documentation

## 2020-01-02 DIAGNOSIS — Z20822 Contact with and (suspected) exposure to covid-19: Secondary | ICD-10-CM | POA: Insufficient documentation

## 2020-01-02 DIAGNOSIS — E119 Type 2 diabetes mellitus without complications: Secondary | ICD-10-CM | POA: Insufficient documentation

## 2020-01-02 LAB — COMPREHENSIVE METABOLIC PANEL
ALT: 20 U/L (ref 0–44)
AST: 15 U/L (ref 15–41)
Albumin: 4 g/dL (ref 3.5–5.0)
Alkaline Phosphatase: 42 U/L (ref 38–126)
Anion gap: 8 (ref 5–15)
BUN: 17 mg/dL (ref 6–20)
CO2: 25 mmol/L (ref 22–32)
Calcium: 8.7 mg/dL — ABNORMAL LOW (ref 8.9–10.3)
Chloride: 103 mmol/L (ref 98–111)
Creatinine, Ser: 0.74 mg/dL (ref 0.61–1.24)
GFR calc Af Amer: >60 mL/min (ref >60)
GFR calc non Af Amer: >60 mL/min (ref >60)
Glucose, Bld: 92 mg/dL (ref 70–99)
Potassium: 3.7 mmol/L (ref 3.5–5.1)
Sodium: 136 mmol/L (ref 135–145)
Total Bilirubin: 0.6 mg/dL (ref 0.3–1.2)
Total Protein: 7.1 g/dL (ref 6.5–8.1)

## 2020-01-02 LAB — ETHANOL: Alcohol, Ethyl (B): <10 mg/dL (ref <10)

## 2020-01-02 LAB — RAPID URINE DRUG SCREEN, HOSP PERFORMED
Amphetamines: NOT DETECTED
Barbiturates: NOT DETECTED
Benzodiazepines: NOT DETECTED
Cocaine: NOT DETECTED
Opiates: NOT DETECTED
Tetrahydrocannabinol: NOT DETECTED

## 2020-01-02 LAB — CBC
HCT: 38.2 % — ABNORMAL LOW (ref 39.0–52.0)
Hemoglobin: 13 g/dL (ref 13.0–17.0)
MCH: 30.7 pg (ref 26.0–34.0)
MCHC: 34 g/dL (ref 30.0–36.0)
MCV: 90.3 fL (ref 80.0–100.0)
Platelets: 251 10*3/uL (ref 150–400)
RBC: 4.23 MIL/uL (ref 4.22–5.81)
RDW: 12.3 % (ref 11.5–15.5)
WBC: 5.8 10*3/uL (ref 4.0–10.5)
nRBC: 0 % (ref 0.0–0.2)

## 2020-01-02 LAB — ACETAMINOPHEN LEVEL: Acetaminophen (Tylenol), Serum: <10 ug/mL — ABNORMAL LOW (ref 10–30)

## 2020-01-02 LAB — SALICYLATE LEVEL: Salicylate Lvl: <7 mg/dL — ABNORMAL LOW (ref 7.0–30.0)

## 2020-01-02 NOTE — ED Triage Notes (Signed)
Patient here from home via [employer] with complaints of suicidal ideation. Refusing to take meds.

## 2020-01-03 ENCOUNTER — Encounter: Payer: Self-pay | Admitting: Physical Medicine & Rehabilitation

## 2020-01-03 ENCOUNTER — Encounter (HOSPITAL_COMMUNITY): Payer: Self-pay | Admitting: Registered Nurse

## 2020-01-03 LAB — RESPIRATORY PANEL BY RT PCR (FLU A&B, COVID)
Influenza A by PCR: NEGATIVE
Influenza B by PCR: NEGATIVE
SARS Coronavirus 2 by RT PCR: NEGATIVE

## 2020-01-03 LAB — CBG MONITORING, ED: Glucose-Capillary: 156 mg/dL — ABNORMAL HIGH (ref 70–99)

## 2020-01-03 MED ORDER — LORAZEPAM 1 MG PO TABS
1.0000 mg | ORAL_TABLET | ORAL | Status: AC | PRN
  Administered 2020-01-04: 1 mg via ORAL
  Filled 2020-01-03: qty 1

## 2020-01-03 MED ORDER — LEVETIRACETAM 500 MG PO TABS
750.0000 mg | ORAL_TABLET | Freq: Two times a day (BID) | ORAL | Status: DC
  Administered 2020-01-03 – 2020-01-04 (×3): 750 mg via ORAL
  Filled 2020-01-03 (×4): qty 1

## 2020-01-03 MED ORDER — ACETAMINOPHEN 325 MG PO TABS: 650.0000 mg | ORAL_TABLET | ORAL | Status: DC | PRN

## 2020-01-03 MED ORDER — ZIPRASIDONE MESYLATE 20 MG IM SOLR: 20.0000 mg | INTRAMUSCULAR | Status: DC | PRN

## 2020-01-03 MED ORDER — ZOLPIDEM TARTRATE 5 MG PO TABS
5.0000 mg | ORAL_TABLET | Freq: Every evening | ORAL | Status: DC | PRN
  Administered 2020-01-03: 5 mg via ORAL
  Filled 2020-01-03: qty 1

## 2020-01-03 MED ORDER — ONDANSETRON HCL 4 MG PO TABS: 4.0000 mg | ORAL_TABLET | Freq: Three times a day (TID) | ORAL | Status: DC | PRN

## 2020-01-03 MED ORDER — LORATADINE 10 MG PO TABS
10.0000 mg | ORAL_TABLET | Freq: Every day | ORAL | Status: DC
  Administered 2020-01-04: 10 mg via ORAL
  Filled 2020-01-03: qty 1

## 2020-01-03 MED ORDER — METHYLPHENIDATE HCL 5 MG PO TABS
10.0000 mg | ORAL_TABLET | Freq: Two times a day (BID) | ORAL | Status: DC
  Administered 2020-01-04: 10 mg via ORAL
  Filled 2020-01-03 (×2): qty 2

## 2020-01-03 MED ORDER — LISINOPRIL 2.5 MG PO TABS
2.5000 mg | ORAL_TABLET | Freq: Every day | ORAL | Status: DC
  Administered 2020-01-04: 2.5 mg via ORAL
  Filled 2020-01-03: qty 1

## 2020-01-03 MED ORDER — RISPERIDONE 1 MG PO TBDP: 2.0000 mg | ORAL_TABLET | Freq: Three times a day (TID) | ORAL | Status: DC | PRN

## 2020-01-03 MED ORDER — METFORMIN HCL 500 MG PO TABS
1000.0000 mg | ORAL_TABLET | Freq: Two times a day (BID) | ORAL | Status: DC
  Administered 2020-01-03 – 2020-01-04 (×2): 1000 mg via ORAL
  Filled 2020-01-03 (×3): qty 2

## 2020-01-03 MED ORDER — ATORVASTATIN CALCIUM 10 MG PO TABS
10.0000 mg | ORAL_TABLET | Freq: Every day | ORAL | Status: DC
  Administered 2020-01-04: 10 mg via ORAL
  Filled 2020-01-03: qty 1

## 2020-01-03 NOTE — ED Notes (Signed)
RN spoke to patient. Patient reports understanding that he needs to take his diabetes medication with dinner. RN asked patient if this RN could give information to his sister over the phone and he stated yes we could.

## 2020-01-03 NOTE — BHH Counselor (Signed)
Per Assunta Found, NP, patient meets criteria for inpatient treatment. Patient does not meet criteria at [facility] due to medical acuity. Discussed with [facility] and requested that their facility review patient for a potential admission. Additionally, contacted the following facilities for consideration:    [facility]        [facility]     [facility]     [facility]       [facility]    [facility]     [facility]      [facility]     [facility]      [facility]      [facility]   [facility]    [facility]  * Patient Speaks Spanish and will need an interpreter.

## 2020-01-03 NOTE — ED Notes (Signed)
Due to patient's TBI and confusion as well as pending d/c to [facility] tomorrow, RN allowed patient's sister to come see patient and speak to him. Patient is calm at this time. Sister has take possession of patient's phone and wallet at his request, as he states he does not wish to take it with him to [facility].  Patient's sister has written down phone numbers for patient to be able to call at [place] and paper copy placed in chart.

## 2020-01-03 NOTE — ED Notes (Signed)
Spoke with patient via video interpreter, Dois Davenport # [id number]. Patient refused his morning scheduled medications. He reported the medication made him feel worse. He reports he is nausea, but does not want to take any medications. He refused to eat his morning meal tray. He did allow to have vital signs obtained. He remained calm and cooperative. Informed patient about plan of care. He denies being SI and/or HI.

## 2020-01-03 NOTE — ED Notes (Signed)
RN updated patient's daughter and sister on status of patient and plan of care. Requesting to keep him close to [place] as they do not have transportation far from [employer] area

## 2020-01-03 NOTE — ED Notes (Signed)
Spoke with patient via Victorio Palm #[id number], patient is refusing to take 10am scheduled medications. He stated to the interpreter, " I feel sick with any medication".

## 2020-01-03 NOTE — Progress Notes (Incomplete)
CSW alerted by RN CM that pt's RN is stating ?
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
## 2020-01-03 NOTE — ED Provider Notes (Addendum)
Why DEPT Provider Note   CSN: [id number] Arrival date & time: 01/02/20  2155     History Chief Complaint  Patient presents with  . Suicidal  . IVC    [name] is a 54 y.o. male.  HPI    54 year old diabetic comes in a chief complaint of and suicidal ideations. Patient has history of seizures and TBI as well.  He does not carry a psychiatry disorder.  Translation service was utilized.  Police has IVC the patient because he allegedly made threatening comments in front of them.  Patient informs me that he is supposed to see Dr. Tessa Lerner tomorrow and that the medications he is prescribing is not helping and makes him feel worse.  He expressed to me that he has had homicidal ideations towards the doctor and also would want to kill himself if he is not getting better.  I spoke with patient's sister who provides similar history.  She informs me that patient has never had such aggressive outburst in the past.  She is now concerned about having him come back to their house, as her children live in the same household.  She states that she has been giving him all his medications as prescribed.  Sisters contact number is [phone].  Past Medical History:  Diagnosis Date  . Diabetes mellitus without complication ([place])   . Seizures ([place])   . TBI (traumatic brain injury) ([place]) 03/2017    Patient Active Problem List   Diagnosis Date Noted  . Influenza vaccine needed 07/14/2019  . Microalbuminuria due to type 2 diabetes mellitus ([place]) 04/11/2019  . Hypothyroidism 09/21/2018  . Seizure disorder ([place]) 06/16/2018  . Low testosterone 06/16/2018  . Traumatic brain injury with loss of consciousness ([place]) 10/29/2017  . Anemia 10/29/2017  . Positive depression screening 10/29/2017  . Attention and concentration deficit   . Type 2 diabetes mellitus with peripheral neuropathy ([facility])   . S/P craniotomy 03/19/2017  . Essential hypertension     . Dysphagia, oropharyngeal 02/01/2017  . Diabetes type 2, uncontrolled ([place]) 06/13/2015    Past Surgical History:  Procedure Laterality Date  . CRANIOTOMY Right 01/13/2017   Procedure: 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, Placement of bone flap in abdomen;  Surgeon: Ashok Pall, MD;  Location: [facility];  Service: Neurosurgery;  Laterality: Right;  . CRANIOTOMY Right 03/19/2017   Procedure: Cranioplasty with placement of bone flap from abdomen;  Surgeon: Eustace Moore, MD;  Location: [facility];  Service: Neurosurgery;  Laterality: Right;       Family History  Problem Relation Age of Onset  . Diabetes Mother   . Diabetes Father     Social History   Tobacco Use  . Smoking status: Never Smoker  . Smokeless tobacco: Never Used  Substance Use Topics  . Alcohol use: Yes    Comment: socially, not since accident  . Drug use: No    Home Medications Prior to Admission medications   Medication Sig Start Date End Date Taking? Authorizing Provider  atorvastatin (LIPITOR) 10 MG tablet Take 1 tablet (10 mg total) by mouth daily. 08/30/19   Meredith Staggers, MD  Blood Glucose Monitoring Suppl (TRUE METRIX METER) w/Device KIT Use as directed 04/19/17   Ladell Pier, MD  cetirizine (ZYRTEC) 10 MG tablet Take 1 tablet (10 mg total) by mouth daily. 08/30/19   Meredith Staggers, MD  glucose blood test strip Use as instructed 12/13/18   Karle Plumber  B, MD  levETIRAcetam (KEPPRA) 500 MG tablet Take 1.5 tablets twice a day 11/02/19   Cameron Sprang, MD  lisinopril (ZESTRIL) 2.5 MG tablet Take 1 tablet (2.5 mg total) by mouth daily. 08/30/19   Meredith Staggers, MD  metFORMIN (GLUCOPHAGE) 1000 MG tablet TAKE 1 TABLET BY MOUTH 2 TIMES DAILY WITH A MEAL. 08/30/19   Meredith Staggers, MD  methylphenidate (RITALIN) 10 MG tablet Take 1 tablet (10 mg total) by mouth 2 (two) times daily with breakfast and lunch. At 0700 and 1200 daily 08/30/19   Meredith Staggers, MD   testosterone cypionate (DEPOTESTOSTERONE CYPIONATE) 200 MG/ML injection Inject 1 mL (200 mg total) into the muscle every 14 (fourteen) days. 08/30/19   Meredith Staggers, MD  TRUEPLUS LANCETS 28G MISC Use as directed 04/19/17   Ladell Pier, MD    Allergies    Latex  Review of Systems   Review of Systems  Constitutional: Negative for activity change.  Respiratory: Negative for shortness of breath.   Cardiovascular: Negative for chest pain.  Neurological: Negative for headaches.  Psychiatric/Behavioral: Positive for behavioral problems, self-injury and suicidal ideas.    Physical Exam Updated Vital Signs BP 123/72 (BP Location: Right Arm)   Pulse 83   Temp 98.4 F (36.9 C) (Oral)   Resp 18   SpO2 98%   Physical Exam Vitals and nursing note reviewed.  Constitutional:      Appearance: He is well-developed.  HENT:     Head: Normocephalic and atraumatic.  Eyes:     Conjunctiva/sclera: Conjunctivae normal.     Pupils: Pupils are equal, round, and reactive to light.  Cardiovascular:     Rate and Rhythm: Normal rate and regular rhythm.     Heart sounds: Normal heart sounds.  Pulmonary:     Effort: Pulmonary effort is normal. No respiratory distress.     Breath sounds: Normal breath sounds. No wheezing.  Abdominal:     General: Bowel sounds are normal. There is no distension.     Palpations: Abdomen is soft.     Tenderness: There is no abdominal tenderness. There is no guarding or rebound.  Musculoskeletal:     Cervical back: Normal range of motion and neck supple.  Skin:    General: Skin is warm.  Neurological:     Mental Status: He is alert and oriented to person, place, and time.  Psychiatric:        Mood and Affect: Mood normal.        Behavior: Behavior normal.     Comments: Abnormal thought content     ED Results / Procedures / Treatments   Labs (all labs ordered are listed, but only abnormal results are displayed) Labs Reviewed  COMPREHENSIVE  METABOLIC PANEL - Abnormal; Notable for the following components:      Result Value   Calcium 8.7 (*)    All other components within normal limits  SALICYLATE LEVEL - Abnormal; Notable for the following components:   Salicylate Lvl <1.6 (*)    All other components within normal limits  ACETAMINOPHEN LEVEL - Abnormal; Notable for the following components:   Acetaminophen (Tylenol), Serum <10 (*)    All other components within normal limits  CBC - Abnormal; Notable for the following components:   HCT 38.2 (*)    All other components within normal limits  RESPIRATORY PANEL BY RT PCR (FLU A&B, COVID)  ETHANOL  RAPID URINE DRUG SCREEN, HOSP PERFORMED    EKG None  Radiology No results found.  Procedures Procedures (including critical care time)  Medications Ordered in ED Medications  acetaminophen (TYLENOL) tablet 650 mg (has no administration in time range)  risperiDONE (RISPERDAL M-TABS) disintegrating tablet 2 mg (has no administration in time range)    And  LORazepam (ATIVAN) tablet 1 mg (has no administration in time range)    And  ziprasidone (GEODON) injection 20 mg (has no administration in time range)  ondansetron (ZOFRAN) tablet 4 mg (has no administration in time range)  zolpidem (AMBIEN) tablet 5 mg (has no administration in time range)  atorvastatin (LIPITOR) tablet 10 mg (has no administration in time range)  loratadine (CLARITIN) tablet 10 mg (has no administration in time range)  levETIRAcetam (KEPPRA) tablet 750 mg (has no administration in time range)  lisinopril (ZESTRIL) tablet 2.5 mg (has no administration in time range)  metFORMIN (GLUCOPHAGE) tablet 1,000 mg (has no administration in time range)  methylphenidate (RITALIN) tablet 10 mg (has no administration in time range)    ED Course  I have reviewed the triage vital signs and the nursing notes.  Pertinent labs & imaging results that were available during my care of the patient were reviewed by me and  considered in my medical decision making (see chart for details).    MDM Rules/Calculators/A&P                      54 year old comes in a chief complaint of suicidal and homicidal ideations.  He has history of TBI, diabetes.   Patient has made verbal threats of hurting his [place] with whom he has an appointment tomorrow.  He made such threats during my encounter and also had made similar threats to his family members.  In front of the police he had alleged suicidal ideations and they have completed involuntary commitment.  His IVC paperwork has been completed in the ED.  He has no medical complaints or concerns and he is medically cleared for psych eval. could be sequelae of his TBI, but will need to be managed by psych.  I have sent a secure chat message to Dr. Naaman Plummer, informing him of the alleged homicidal ideations made towards him by the patient.  Authorities already made aware of SI, HI.  Final Clinical Impression(s) / ED Diagnoses Final diagnoses:  Homicidal ideations  Suicidal ideations  Traumatic brain injury, without loss of consciousness, sequela [facility])    Rx / DC Orders ED Discharge Orders    None          Varney Biles, MD 01/03/20 [phone]

## 2020-01-03 NOTE — ED Notes (Signed)
Rn has tried x3 to call sister to update her without success and unable to leave a voice message as it stated "the mailbox is full"

## 2020-01-03 NOTE — Consult Note (Signed)
  [name], 54 y.o., male patient seen via tele psych by this provider, consulted with Dr. Lucianne Muss; and chart reviewed on 01/03/20.  Interpreter [phone] use full assessment; patient speaks no [place] Spanish primary language.  On evaluation [name] reports he does not know why he was brought to the hospital.  "I feel good, nothing happen, I am okay."  When asked if he was having any suicidal thoughts patient responded "Yes, I'm tired of the same old thing after the surgery nothing was resolved.  Nothing has improved.  Every time I go to the doctor is always the same thing.  She tells me to exercise and it would help my back, head, and everything.  After the surgery it was supposed to be repaired, I don't know why I hired that type of doctor."  Patient asked if he was having any homicidal thoughts toward her doctor if he made any statements that he wanted to hurt or kill the doctor and he responded " yes, because every time I go to her it is always the same thing, tired of hearing it, she needs to go back to school.  I do not even think she is a real doctor."  Patient denies prior history of violence.  Patient also denies psychiatric history with no(psychotropics, inpatient/outpatient.)  At this time patient also denies psychosis and paranoia.  Reports that he does not have any guns in the home but "My cousin is part of [place] and he is going to take care of it for me."  Patient informed since he is making homicidal threats that the person he is threatening would have to be warned and he responded "I don't care, my cousin will take care of it.    During evaluation [name] is alert/oriented x 4; calm/cooperative during assessment.  His mood is congruent with affect.  He does not appear to be responding to internal/external stimuli or delusional thoughts.  Patient denies psychosis, and paranoia, but continues to endorse suicidal/homicidal ideation.  Patient answered  question appropriately.     Spoke with [employer] Melida Gimenez and informed of a duty to warn.  Dr. Harold Hedge.  Also informed may need to check with patient to make sure correct MD did not see any recent notes from doctor    Reports he will get in touch with law enforcement for duty to warn.

## 2020-01-03 NOTE — Progress Notes (Signed)
Pt accepted to [facility]; Adult Unit. Room to be determined.    Dr. Estill Cotta is the admitting and attending physician.   Call report to [phone].   Kaitlin @ [facility] ED notified.     Pt is IVC.    Pt may be transported by law enforcement   Pt scheduled to arrive on 01/04/20 after 7:00am.   Drucilla Schmidt, MSW, LCSW-A Clinical Disposition Social Worker [employer] [facility]/TTS [phone]

## 2020-01-04 NOTE — ED Notes (Signed)
Sheriff called for transport to [facility]. [name] (sister) called and updated on information and will be called when patient leaves for [place] with sheriff.

## 2020-01-19 MED FILL — ARIPiprazole 2 MG TABS: 2 | 30 days supply | Qty: 30 | Fill #0

## 2020-01-19 MED FILL — FLUoxetine HCL 10 MG CAPS: 10 | 30 days supply | Qty: 30 | Fill #0

## 2020-02-01 ENCOUNTER — Ambulatory Visit: Payer: No Typology Code available for payment source | Attending: Internal Medicine

## 2020-02-01 DIAGNOSIS — Z23 Encounter for immunization: Secondary | ICD-10-CM

## 2020-02-01 NOTE — Progress Notes (Signed)
   Covid-19 Vaccination Clinic  Name:  [name]    MRN: [id number] DOB: [birth year]  02/01/2020  Mr. [name] was observed post Covid-19 immunization for 15 minutes without incident. He was provided with Vaccine Information Sheet and instruction to access the V-Safe system.   Mr. [name] was instructed to call 911 with any severe reactions post vaccine: Marland Kitchen Difficulty breathing  . Swelling of face and throat  . A fast heartbeat  . A bad rash all over body  . Dizziness and weakness   Immunizations Administered    Name Date Dose VIS Date Route   Pfizer COVID-19 Vaccine 02/01/2020 10:33 AM 0.3 mL 10/13/2019 Intramuscular   Manufacturer: [employer], [employer]   Lot: [id number]   NDC: [address]-4

## 2020-02-05 MED FILL — levETIRAcetam 500 MG TABS: 500 | 30 days supply | Qty: 90 | Fill #1

## 2020-02-05 MED FILL — ATORVASTATIN 10 MG TABLET: 10 | 30 days supply | Qty: 30 | Fill #3

## 2020-02-05 MED FILL — metFORMIN HCL 1000 MG TABS: 1000 | 30 days supply | Qty: 60 | Fill #3

## 2020-02-05 MED FILL — LISINOPRIL 2.5 MG TABLET: 2.5 | 30 days supply | Qty: 30 | Fill #3

## 2020-02-14 ENCOUNTER — Other Ambulatory Visit: Payer: Self-pay

## 2020-02-14 ENCOUNTER — Ambulatory Visit: Payer: Self-pay

## 2020-02-16 ENCOUNTER — Ambulatory Visit: Payer: No Typology Code available for payment source

## 2020-02-19 MED FILL — FLUoxetine HCL 10 MG CAPS: 10 | 30 days supply | Qty: 30 | Fill #1

## 2020-02-27 ENCOUNTER — Ambulatory Visit: Payer: No Typology Code available for payment source | Attending: Internal Medicine

## 2020-02-27 DIAGNOSIS — Z23 Encounter for immunization: Secondary | ICD-10-CM

## 2020-02-27 NOTE — Progress Notes (Signed)
   Covid-19 Vaccination Clinic  Name:  [name]    MRN: [id number] DOB: Apr 27, 1966  02/27/2020  Mr. [name] was observed post Covid-19 immunization for 15 minutes without incident. He was provided with Vaccine Information Sheet and instruction to access the V-Safe system.   Mr. [name] was instructed to call 911 with any severe reactions post vaccine: Marland Kitchen Difficulty breathing  . Swelling of face and throat  . A fast heartbeat  . A bad rash all over body  . Dizziness and weakness   Immunizations Administered    Name Date Dose VIS Date Route   Pfizer COVID-19 Vaccine 02/27/2020  1:13 PM 0.3 mL 12/27/2018 Intramuscular   Manufacturer: [employer], [employer]   Lot: [id number]   NDC: [address]-4

## 2020-03-05 MED FILL — FLUoxetine HCL 20 MG CAPS: 20 | 30 days supply | Qty: 30 | Fill #0

## 2020-03-06 MED FILL — ?ATORVASTATIN CALCIUM 10MG: 10 | 30 days supply | Qty: 30 | Fill #4

## 2020-03-06 MED FILL — levETIRAcetam 500 MG TABS: 500 | 30 days supply | Qty: 90 | Fill #2

## 2020-03-06 MED FILL — LISINOPRIL 2.5 MG TABLET: 2.5 | 30 days supply | Qty: 30 | Fill #4

## 2020-03-06 MED FILL — metFORMIN HCL 1000 MG TABS: 1000 | 30 days supply | Qty: 60 | Fill #4

## 2020-03-06 MED FILL — ?CETIRIZINE HCL 10 MG TABLE: 10 | 30 days supply | Qty: 30 | Fill #1

## 2020-04-08 MED FILL — ?ATORVASTATIN 10 MG TABLET: 10 | 30 days supply | Qty: 30 | Fill #5

## 2020-05-09 ENCOUNTER — Ambulatory Visit: Payer: No Typology Code available for payment source | Admitting: Internal Medicine

## 2020-05-13 MED FILL — FLUoxetine HCL 20 MG CAPS: 20 | 30 days supply | Qty: 30 | Fill #2

## 2020-05-13 MED FILL — ?ATORVASTATIN 10 MG TABLET: 10 | 30 days supply | Qty: 30 | Fill #6

## 2020-05-13 MED FILL — metFORMIN HCL 1000 MG TABS: 1000 | 30 days supply | Qty: 60 | Fill #6

## 2020-05-13 MED FILL — levETIRAcetam 500 MG TABS: 500 | 30 days supply | Qty: 90 | Fill #4

## 2020-05-13 MED FILL — LISINOPRIL 2.5 MG TABLET: 2.5 | 30 days supply | Qty: 30 | Fill #6

## 2020-05-14 ENCOUNTER — Encounter: Payer: Self-pay | Admitting: Internal Medicine

## 2020-05-14 ENCOUNTER — Ambulatory Visit: Payer: No Typology Code available for payment source | Attending: Internal Medicine | Admitting: Internal Medicine

## 2020-05-14 ENCOUNTER — Other Ambulatory Visit: Payer: Self-pay | Admitting: Internal Medicine

## 2020-05-14 ENCOUNTER — Other Ambulatory Visit: Payer: Self-pay

## 2020-05-14 VITALS — BP 117/74 | HR 85 | Temp 98.1°F | Resp 16 | Wt 129.8 lb

## 2020-05-14 DIAGNOSIS — E119 Type 2 diabetes mellitus without complications: Secondary | ICD-10-CM

## 2020-05-14 DIAGNOSIS — G40909 Epilepsy, unspecified, not intractable, without status epilepticus: Secondary | ICD-10-CM

## 2020-05-14 DIAGNOSIS — R809 Proteinuria, unspecified: Secondary | ICD-10-CM

## 2020-05-14 DIAGNOSIS — I10 Essential (primary) hypertension: Secondary | ICD-10-CM

## 2020-05-14 DIAGNOSIS — Z730 Burn-out: Secondary | ICD-10-CM

## 2020-05-14 DIAGNOSIS — F321 Major depressive disorder, single episode, moderate: Secondary | ICD-10-CM

## 2020-05-14 DIAGNOSIS — R7989 Other specified abnormal findings of blood chemistry: Secondary | ICD-10-CM

## 2020-05-14 DIAGNOSIS — E1129 Type 2 diabetes mellitus with other diabetic kidney complication: Secondary | ICD-10-CM

## 2020-05-14 MED ORDER — ATORVASTATIN CALCIUM 10 MG PO TABS: 10.0000 mg | ORAL_TABLET | Freq: Every day | ORAL | 5 refills | Status: DC

## 2020-05-14 MED ORDER — LISINOPRIL 2.5 MG PO TABS: 2.5000 mg | ORAL_TABLET | Freq: Every day | ORAL | 6 refills | Status: DC

## 2020-05-14 MED ORDER — LEVETIRACETAM 500 MG PO TABS: ORAL_TABLET | ORAL | 3 refills | Status: DC

## 2020-05-14 MED ORDER — METFORMIN HCL 1000 MG PO TABS: ORAL_TABLET | ORAL | 6 refills | Status: DC

## 2020-05-14 MED ORDER — FLUOXETINE HCL 20 MG PO TABS: 20.0000 mg | ORAL_TABLET | Freq: Every day | ORAL | 3 refills | Status: DC

## 2020-05-14 NOTE — Progress Notes (Signed)
Patient ID: [name], male    DOB: May 12, 1966  MRN: [id number]  CC: Depression   Subjective: Jameel Quant is a 54 y.o. male who presents for chronic ds management.  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  Sister reports that pt has not been doing well. Taken to ER 01/03/2020 due to aggressive behavior and threats of SI.  [name] states that the aggression was brought on when he was told that he had an appointment with Dr. Tessa Lerner the following day.  Patient did not want to go stating that the medications that Dr. Tessa Lerner was prescribing is not helping and makes him feel worse.  Thinking was that behavior due to Ritalin. Taken as IVC to a mental health facility in [place] for 10 days. Since then they had one tele-medicine visit with a psychiatrist then told to f/u with psychiatrist at [facility].  Placed on Prozac 20 mg daily. Sister feels it has helped; not aggressive any more.  She has tried calling Dr. Tessa Lerner office to schedule a follow-up appointment but no one would get back to her. She is stressed out and dealing with her brother.  She just wants him to get back to her and back to the way that he was prior to the brain injury.  She is quite tearful today.  She has no other siblings in the [place] to assist with his care.  She cannot send him to to [place] because her mother is sick and has been hospitalized recently.  -Patient remains independent in feeding and bathing himself.  However he lacks motivation to do much of anything.  Sister states that he would sit on the couch unless she turns the TV on he would not do it.  No SZ since last visit  Taking the [facility].   DM: Sister reports that his blood sugars have been good.  Range has been  1 20-1 50.  Taking Metformin, Lisinopril and Atorvastatin.  Appetite is good.  Low testosterone: Patient used to get testosterone injections through Dr. Tessa Lerner.  Since he will no longer be going back there, sister is requesting referral to endocrinology. Patient Active Problem List   Diagnosis Date Noted  . Influenza vaccine needed 07/14/2019  . Microalbuminuria due to type 2 diabetes mellitus ([place]) 04/11/2019  . Hypothyroidism 09/21/2018  . Seizure disorder ([place]) 06/16/2018  . Low testosterone 06/16/2018  . Traumatic brain injury with loss of consciousness ([place]) 10/29/2017  . Anemia 10/29/2017  . Positive depression screening 10/29/2017  . Attention and concentration deficit   . Type 2 diabetes mellitus with peripheral neuropathy (HCC)   . S/P craniotomy 03/19/2017  . Essential hypertension   . Dysphagia, oropharyngeal 02/01/2017  . Diabetes type 2, uncontrolled ([place]) 06/13/2015     Current Outpatient Medications on File Prior to Visit  Medication Sig Dispense Refill  . Blood Glucose Monitoring Suppl (TRUE METRIX METER) w/Device KIT Use as directed 1 kit 0  . cetirizine (ZYRTEC) 10 MG tablet Take 1 tablet (10 mg total) by mouth daily. 30 tablet 11  . glucose blood test strip Use as instructed 100 each 12  . testosterone cypionate (DEPOTESTOSTERONE CYPIONATE) 200 MG/ML injection Inject 1 mL (200 mg total) into the muscle every 14 (fourteen) days. 2 mL 5  . TRUEPLUS LANCETS 28G MISC Use as directed  100 each 6   No current facility-administered medications on file prior to visit.    Allergies  Allergen Reactions  . Latex Itching and Rash    Social History   Socioeconomic History  . Marital status: Single    Spouse name: Not on file  . Number of children: 1  . Years of education: 23  . Highest education level: High school graduate  Occupational History  . Occupation: not working  Tobacco Use  . Smoking status: Never Smoker  . Smokeless tobacco: Never Used  Vaping Use    . Vaping Use: Never used  Substance and Sexual Activity  . Alcohol use: Yes    Comment: socially, not since accident  . Drug use: No  . Sexual activity: Not on file  Other Topics Concern  . Not on file  Social History Narrative   Lives with sister and her family.  Has one daughter in [place].  Does not work.  Education: high school.    Social Determinants of Health   Financial Resource Strain:   . Difficulty of Paying Living Expenses:   Food Insecurity:   . Worried About Charity fundraiser in the Last Year:   . Arboriculturist in the Last Year:   Transportation Needs:   . Film/video editor (Medical):   Marland Kitchen Lack of Transportation (Non-Medical):   Physical Activity:   . Days of Exercise per Week:   . Minutes of Exercise per Session:   Stress:   . Feeling of Stress :   Social Connections:   . Frequency of Communication with Friends and Family:   . Frequency of Social Gatherings with Friends and Family:   . Attends Religious Services:   . Active Member of Clubs or Organizations:   . Attends Archivist Meetings:   Marland Kitchen Marital Status:   Intimate Partner Violence:   . Fear of Current or Ex-Partner:   . Emotionally Abused:   Marland Kitchen Physically Abused:   . Sexually Abused:     Family History  Problem Relation Age of Onset  . Diabetes Mother   . Diabetes Father     Past Surgical History:  Procedure Laterality Date  . CRANIOTOMY Right 01/13/2017   Procedure: 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, Placement of bone flap in abdomen;  Surgeon: Ashok Pall, MD;  Location: [facility];  Service: Neurosurgery;  Laterality: Right;  . CRANIOTOMY Right 03/19/2017   Procedure: Cranioplasty with placement of bone flap from abdomen;  Surgeon: Eustace Moore, MD;  Location: [facility];  Service: Neurosurgery;  Laterality: Right;    ROS: Review of Systems Negative except as stated above  PHYSICAL EXAM: BP 117/74   Pulse 85   Temp 98.1 F (36.7 C)   Resp  16   Wt 129 lb 12.8 oz (58.9 kg)   SpO2 97%   BMI 23.74 kg/m   Physical Exam  General appearance -patient in NAD. Mental status -patient with flat affect.  He knows his name, date of birth and the day of the week.  He does not know the month or the year. Eyes - pupils equal and reactive, extraocular eye movements intact Chest - clear to auscultation, no wheezes, rales or rhonchi, symmetric air entry Heart - normal rate, regular rhythm, normal S1, S2, no murmurs, rubs, clicks or gallops Extremities - peripheral pulses normal, no pedal edema, no clubbing or cyanosis Lab Results  Component Value Date   HGBA1C 5.4 04/20/2019  Depression screen [facility] 2/9 07/14/2019 12/13/2018 09/21/2018  Decreased Interest 2 0 3  Down, Depressed, Hopeless 2 0 0  PHQ - 2 Score 4 0 3  Altered sleeping 2 - 0  Tired, decreased energy 1 - 1  Change in appetite 0 - 0  Feeling bad or failure about yourself  2 - 0  Trouble concentrating 1 - 1  Moving slowly or fidgety/restless 1 - 1  Suicidal thoughts 0 - 0  PHQ-9 Score 11 - 6  Some recent data might be hidden    CMP Latest Ref Rng & Units 01/02/2020 04/20/2019 06/16/2018  Glucose 70 - 99 mg/dL 92 115(H) -  BUN 6 - 20 mg/dL 17 16 -  Creatinine 0.61 - 1.24 mg/dL 0.74 0.77 -  Sodium 135 - 145 mmol/L 136 139 -  Potassium 3.5 - 5.1 mmol/L 3.7 4.1 -  Chloride 98 - 111 mmol/L 103 103 -  CO2 22 - 32 mmol/L 25 24 -  Calcium 8.9 - 10.3 mg/dL 8.7(L) 8.4(L) -  Total Protein 6.5 - 8.1 g/dL 7.1 6.6 7.0  Total Bilirubin 0.3 - 1.2 mg/dL 0.6 0.6 0.5  Alkaline Phos 38 - 126 U/L 42 47 54  AST 15 - 41 U/L '15 15 11  '$ ALT 0 - 44 U/L '20 22 15   '$ Lipid Panel     Component Value Date/Time   CHOL 143 04/20/2019 0914   TRIG 81 04/20/2019 0914   HDL 54 04/20/2019 0914   CHOLHDL 2.6 04/20/2019 0914   CHOLHDL 5.2 (H) 06/02/2016 1232   VLDL NOT CALC 06/02/2016 1232   LDLCALC 73 04/20/2019 0914    CBC    Component Value Date/Time   WBC 5.8 01/02/2020 2249   RBC 4.23  01/02/2020 2249   HGB 13.0 01/02/2020 2249   HGB 14.1 04/20/2019 0914   HCT 38.2 (L) 01/02/2020 2249   HCT 40.2 04/20/2019 0914   PLT 251 01/02/2020 2249   PLT 229 04/20/2019 0914   MCV 90.3 01/02/2020 2249   MCV 88 04/20/2019 0914   MCH 30.7 01/02/2020 2249   MCHC 34.0 01/02/2020 2249   RDW 12.3 01/02/2020 2249   RDW 12.8 04/20/2019 0914   LYMPHSABS 1.1 05/10/2018 1849   LYMPHSABS 1.4 04/01/2017 1444   MONOABS 0.3 05/10/2018 1849   EOSABS 0.1 05/10/2018 1849   EOSABS 0.1 04/01/2017 1444   BASOSABS 0.0 05/10/2018 1849   BASOSABS 0.0 04/01/2017 1444    ASSESSMENT AND PLAN:  1. Controlled type 2 diabetes mellitus without complication, without long-term current use of insulin ([place]) Reported blood sugars are good.  Continue Metformin - Hemoglobin A1c - metFORMIN (GLUCOPHAGE) 1000 MG tablet; TAKE 1 TABLET BY MOUTH 2 TIMES DAILY WITH A MEAL.  Dispense: 180 tablet; Refill: 6  2. Microalbuminuria due to type 2 diabetes mellitus (HCC) - lisinopril (ZESTRIL) 2.5 MG tablet; Take 1 tablet (2.5 mg total) by mouth daily.  Dispense: 30 tablet; Refill: 6  3. Low testosterone Since patient does not want to go back to Dr. Tessa Lerner, we will refer him to an endocrinologist - Testosterone - Ambulatory referral to Endocrinology  4. Burnout of caregiver I explained to the patient's sister that her expectation of her brother being back to normal is probably unrealistic given the brain injury that he has had.  She can benefit from some counseling or support group.  I will have our LCSW touch base with her.  5. Seizure disorder (HCC) Controlled on Keppra - levETIRAcetam (KEPPRA) 500 MG tablet; Take  1.5 tablets twice a day  Dispense: 270 tablet; Refill: 3   7. Essential hypertension - lisinopril (ZESTRIL) 2.5 MG tablet; Take 1 tablet (2.5 mg total) by mouth daily.  Dispense: 30 tablet; Refill: 6  8. Current moderate episode of major depressive disorder without prior episode ([place]) - Ambulatory  referral to Psychiatry - FLUoxetine (PROZAC) 20 MG tablet; Take 1 tablet (20 mg total) by mouth daily.  Dispense: 30 tablet; Refill: 3   Patient was given the opportunity to ask questions.  Patient verbalized understanding of the plan and was able to repeat key elements of the plan.   Orders Placed This Encounter  Procedures  . Hemoglobin A1c  . Testosterone  . Ambulatory referral to Psychiatry  . Ambulatory referral to Endocrinology     Requested Prescriptions   Signed Prescriptions Disp Refills  . atorvastatin (LIPITOR) 10 MG tablet 90 tablet 5    Sig: Take 1 tablet (10 mg total) by mouth daily.  Marland Kitchen lisinopril (ZESTRIL) 2.5 MG tablet 30 tablet 6    Sig: Take 1 tablet (2.5 mg total) by mouth daily.  . metFORMIN (GLUCOPHAGE) 1000 MG tablet 180 tablet 6    Sig: TAKE 1 TABLET BY MOUTH 2 TIMES DAILY WITH A MEAL.  Marland Kitchen levETIRAcetam (KEPPRA) 500 MG tablet 270 tablet 3    Sig: Take 1.5 tablets twice a day  . FLUoxetine (PROZAC) 20 MG tablet 30 tablet 3    Sig: Take 1 tablet (20 mg total) by mouth daily.    Return in about 4 months (around 09/14/2020).  Karle Plumber, MD, FACP

## 2020-05-15 LAB — TESTOSTERONE: Testosterone: 282 ng/dL (ref 264–916)

## 2020-05-15 LAB — HEMOGLOBIN A1C
Est. average glucose Bld gHb Est-mCnc: 103 mg/dL
Hgb A1c MFr Bld: 5.2 % (ref 4.8–5.6)

## 2020-05-24 ENCOUNTER — Telehealth: Payer: Self-pay | Admitting: Licensed Clinical Social Worker

## 2020-05-24 NOTE — Telephone Encounter (Signed)
Call placed to patient regarding IBH referral. LCSW left message requesting a return call.  

## 2020-06-04 ENCOUNTER — Ambulatory Visit (INDEPENDENT_AMBULATORY_CARE_PROVIDER_SITE_OTHER): Payer: Self-pay | Admitting: Licensed Clinical Social Worker

## 2020-06-04 ENCOUNTER — Other Ambulatory Visit: Payer: Self-pay

## 2020-06-04 DIAGNOSIS — F439 Reaction to severe stress, unspecified: Secondary | ICD-10-CM

## 2020-06-04 MED FILL — FLUoxetine HCL 20 MG TABS: 20 | 30 days supply | Qty: 30 | Fill #0

## 2020-06-04 MED FILL — levETIRAcetam 500 MG TABS: 500 | 30 days supply | Qty: 90 | Fill #0

## 2020-06-04 MED FILL — ?ATORVASTATIN 10 MG TABLET: 10 | 30 days supply | Qty: 30 | Fill #7

## 2020-06-04 MED FILL — LISINOPRIL 2.5 MG TABLET: 2.5 | 30 days supply | Qty: 30 | Fill #0

## 2020-06-08 NOTE — BH Specialist Note (Signed)
Integrated Behavioral Health Initial Visit  MRN: [id number] Name: [name] [facility]  Number of Integrated Behavioral Health Clinician visits:: 1/6 Session Start time: 11:15 AM  Session End time: 11:30  AM Total time: 15  Type of Service: Integrated Behavioral Health- Individual Interpretor:Yes.   Interpretor Name and Language: Pt's sister, [name] who provided majority of history  Spanish    SUBJECTIVE: [name] Plumber is a 54 y.o. male accompanied by Sibling Patient was referred by to Dr. Laural Benes for resources. Patient reports the following symptoms/concerns: Patient sister reports need for services to assist her with caring for her brother.  Concerned that patient sleeps often and has difficulty following instructions at times Duration of problem: 2018; Severity of problem: NA  OBJECTIVE: Mood: NA and Affect: Appropriate Risk of harm to self or others: No plan to harm self or others  LIFE CONTEXT: Family and Social: Received strong support from family who he resides with School/Work: Patient does not receive any services.  He receives: Health discount and was denied Medicaid through hospital Self-Care: Patient enjoys spending time with family and watching television Life Changes: Patient's family reports difficulty in managing his care and is interested in services to provide additional support  GOALS ADDRESSED: Patient will: 1. Reduce symptoms of: stress 2. Increase knowledge and/or ability of: Supportive services  3. Demonstrate ability to: Increase healthy adjustment to current life circumstances and Increase adequate support systems for patient/family  INTERVENTIONS: Interventions utilized: Solution-Focused Strategies, Supportive Counseling and Link to [employer]  Standardized Assessments completed: PHQ 2&9  ASSESSMENT: Patient's family is currently experiencing stress in providing care.  Patient's sister is requesting supportive services.    Patient may benefit from engaged to community resources to strengthen support system.  LCSW provided support and encouragement.  A referral to the [facility] and/or legal aid will be completed to assess whether patient is eligible for disability.Marland Kitchen  PLAN: 1. Follow up with behavioral health clinician on : Contact LCSW with additional behavioral health and/or resource needs 2. Behavioral recommendations: Utilize strategies discussed and resources provided 3. Referral(s): Integrated Behavioral Health Services (In Clinic) 4. "From scale of 1-10, how likely are you to follow plan?":   Bridgett Larsson, LCSW 06/08/2020 4:31 PM

## 2020-06-11 ENCOUNTER — Telehealth: Payer: Self-pay | Admitting: Licensed Clinical Social Worker

## 2020-06-11 NOTE — Telephone Encounter (Signed)
A Completed Disability Assistance Program Referral submitted.

## 2020-06-19 ENCOUNTER — Telehealth: Payer: Self-pay | Admitting: Licensed Clinical Social Worker

## 2020-06-19 NOTE — Telephone Encounter (Signed)
Incoming call received from [facility] with [facility] [phone]   December reports difficulty reaching family to complete application. LCSW agreed to contact pt's sister, [name], and provide contact information above.   LCSW placed call to [name] and informed her of [facility]'s attempts to contact family. She was provided contact information for Ms.  December and verbalized that she would follow up asap. No additional concerns noted.

## 2020-07-09 MED FILL — ?FLUOXETINE HCL 20MG TABL: 20 | 30 days supply | Qty: 30 | Fill #1

## 2020-07-09 MED FILL — LISINOPRIL 2.5 MG TABLET: 2.5 | 30 days supply | Qty: 30 | Fill #1

## 2020-07-09 MED FILL — levETIRAcetam 500 MG TABS: 500 | 30 days supply | Qty: 90 | Fill #1

## 2020-07-09 MED FILL — ?ATORVASTATIN 10 MG TABLET: 10 | 30 days supply | Qty: 30 | Fill #8

## 2020-07-09 MED FILL — metFORMIN HCL 1000 MG TABS: 1000 | 30 days supply | Qty: 60 | Fill #7

## 2020-07-16 ENCOUNTER — Other Ambulatory Visit: Payer: Self-pay

## 2020-07-16 ENCOUNTER — Ambulatory Visit (INDEPENDENT_AMBULATORY_CARE_PROVIDER_SITE_OTHER): Payer: No Payment, Other | Admitting: Psychiatry

## 2020-07-16 ENCOUNTER — Encounter (HOSPITAL_COMMUNITY): Payer: Self-pay | Admitting: Psychiatry

## 2020-07-16 DIAGNOSIS — F331 Major depressive disorder, recurrent, moderate: Secondary | ICD-10-CM | POA: Diagnosis not present

## 2020-07-16 MED ORDER — FLUOXETINE HCL 10 MG PO CAPS: ORAL_CAPSULE | ORAL | 1 refills | Status: DC

## 2020-07-16 MED FILL — FLUoxetine HCL 10 MG CAPS: 10 | 30 days supply | Qty: 90 | Fill #0
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
## 2020-08-20 ENCOUNTER — Ambulatory Visit: Payer: No Typology Code available for payment source | Admitting: Endocrinology

## 2020-08-27 ENCOUNTER — Ambulatory Visit: Payer: No Typology Code available for payment source | Attending: Internal Medicine

## 2020-08-27 ENCOUNTER — Other Ambulatory Visit: Payer: Self-pay

## 2020-09-03 ENCOUNTER — Encounter (HOSPITAL_COMMUNITY): Payer: Self-pay | Admitting: Psychiatry

## 2020-09-03 ENCOUNTER — Ambulatory Visit (INDEPENDENT_AMBULATORY_CARE_PROVIDER_SITE_OTHER): Payer: No Payment, Other | Admitting: Psychiatry

## 2020-09-03 ENCOUNTER — Other Ambulatory Visit: Payer: Self-pay

## 2020-09-03 ENCOUNTER — Other Ambulatory Visit (HOSPITAL_COMMUNITY): Payer: Self-pay | Admitting: Psychiatry

## 2020-09-03 VITALS — BP 123/79 | HR 78 | Ht 63.0 in | Wt 128.0 lb

## 2020-09-03 DIAGNOSIS — F3342 Major depressive disorder, recurrent, in full remission: Secondary | ICD-10-CM | POA: Diagnosis not present

## 2020-09-03 DIAGNOSIS — F331 Major depressive disorder, recurrent, moderate: Secondary | ICD-10-CM

## 2020-09-03 MED ORDER — FLUOXETINE HCL 10 MG PO CAPS: ORAL_CAPSULE | ORAL | 2 refills | Status: DC

## 2020-09-03 MED FILL — FLUoxetine HCL 10 MG CAPS: 10 | 30 days supply | Qty: 90 | Fill #0
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
## 2020-09-04 MED FILL — levETIRAcetam 500 MG TABS: 500 | 30 days supply | Qty: 90 | Fill #2

## 2020-09-04 MED FILL — LISINOPRIL 2.5 MG TABLET: 2.5 | 30 days supply | Qty: 30 | Fill #2

## 2020-09-04 MED FILL — ?ATORVASTATIN 10 MG TABLET: 10 | 30 days supply | Qty: 30 | Fill #0

## 2020-09-04 MED FILL — METFORMIN HCL 1000 MG TABS: 1000 | 30 days supply | Qty: 60 | Fill #0

## 2020-09-17 ENCOUNTER — Ambulatory Visit: Payer: Self-pay | Attending: Internal Medicine | Admitting: Internal Medicine

## 2020-09-17 ENCOUNTER — Other Ambulatory Visit: Payer: Self-pay

## 2020-09-17 DIAGNOSIS — Z1211 Encounter for screening for malignant neoplasm of colon: Secondary | ICD-10-CM

## 2020-09-17 DIAGNOSIS — E119 Type 2 diabetes mellitus without complications: Secondary | ICD-10-CM

## 2020-09-17 DIAGNOSIS — F321 Major depressive disorder, single episode, moderate: Secondary | ICD-10-CM

## 2020-09-17 DIAGNOSIS — G40909 Epilepsy, unspecified, not intractable, without status epilepticus: Secondary | ICD-10-CM

## 2020-09-17 DIAGNOSIS — Z23 Encounter for immunization: Secondary | ICD-10-CM

## 2020-09-17 NOTE — Progress Notes (Signed)
Virtual Visit via Telephone Note Due to current restrictions/limitations of in-office visits due to the COVID-19 pandemic, this scheduled clinical appointment was converted to a telehealth visit  I connected with [name] on 09/17/20 at 10:29 a.m by telephone and verified that I am speaking with the correct person using two identifiers.  Location: Patient: home Provider: office Sister Oley Balm gives hx I discussed the limitations, risks, security and privacy concerns of performing an evaluation and management service by telephone and the availability of in person appointments. I also discussed with the patient that there may be a patient responsible charge related to this service. The patient expressed understanding and agreed to proceed.   History of Present Illness: Pt withhistory of diabetes type 2with microalbumin (on Lisinopril for this),HTN,HL,low testosterone, grand-mal szand craniotomy due tointracranial bleed, cranial fractures and subdural hematomas2018.  Pt not motivated to do anything unless he is instructed to do so Started seeing psychiatrist Dr. Toy Care.  Started on prozac.  Doing okay but pt sometimes forgets to take it.  Sister sets up pill box.  Sister has been in contact with our LCSW to be informed of support in the community to help prevent caregiver burnout.  Sz:  No episodes since last visit.  He is on Keppra..  DM: Sister checks blood sugars daily.  Range 110-120.  Patient has to be monitored in terms of his eating habits otherwise he tends to overeat.  HM:  Due for flu shot and FIT.   Outpatient Encounter Medications as of 09/17/2020  Medication Sig Note  . atorvastatin (LIPITOR) 10 MG tablet Take 1 tablet (10 mg total) by mouth daily.   . Blood Glucose Monitoring Suppl (TRUE METRIX METER) w/Device KIT Use as directed   . cetirizine (ZYRTEC) 10 MG tablet Take 1 tablet (10 mg total) by mouth daily.   Marland Kitchen FLUoxetine (PROZAC) 10 MG capsule Take 3  capsules daily (30 mg)   . glucose blood test strip Use as instructed   . levETIRAcetam (KEPPRA) 500 MG tablet Take 1.5 tablets twice a day   . lisinopril (ZESTRIL) 2.5 MG tablet Take 1 tablet (2.5 mg total) by mouth daily.   . metFORMIN (GLUCOPHAGE) 1000 MG tablet TAKE 1 TABLET BY MOUTH 2 TIMES DAILY WITH A MEAL.   Marland Kitchen testosterone cypionate (DEPOTESTOSTERONE CYPIONATE) 200 MG/ML injection Inject 1 mL (200 mg total) into the muscle every 14 (fourteen) days. 01/03/2020: Last filled 12/25/19 for 2 ml  . TRUEPLUS LANCETS 28G MISC Use as directed    No facility-administered encounter medications on file as of 09/17/2020.      Observations/Objective: Depression screen [facility] 2/9 09/17/2020 06/04/2020 07/14/2019  Decreased Interest 0 3 2  Down, Depressed, Hopeless 0 2 2  PHQ - 2 Score 0 5 4  Altered sleeping - 3 2  Tired, decreased energy - 0 1  Change in appetite - 0 0  Feeling bad or failure about yourself  - 0 2  Trouble concentrating - 1 1  Moving slowly or fidgety/restless - 0 1  Suicidal thoughts - 0 0  PHQ-9 Score - 9 11  Some recent data might be hidden     Assessment and Plan: 1. Controlled type 2 diabetes mellitus without complication, without long-term current use of insulin (HCC) Controlled on Metformin.  Continue current medication.  Encourage patient to follow instructions of his sister in regards to not eating too much.  2. Seizure disorder (HCC) Stable on Keppra  3. Current moderate episode of major depressive disorder  without prior episode ([place]) Followed by psychiatry.  Currently on Prozac.  4. Screening for colon cancer Sister will pick up fit kit when she brings him to get his flu shot. - Fecal occult blood, imunochemical([facility])  5. Need for influenza vaccination Appointment scheduled with the clinical pharmacist for flu vaccine.   Follow Up Instructions: 4 mths   I discussed the assessment and treatment plan with the patient. The patient was provided  an opportunity to ask questions and all were answered. The patient agreed with the plan and demonstrated an understanding of the instructions.   The patient was advised to call back or seek an in-person evaluation if the symptoms worsen or if the condition fails to improve as anticipated.  I provided 7 minutes of non-face-to-face time during this encounter.   Karle Plumber, MD

## 2020-09-24 ENCOUNTER — Other Ambulatory Visit: Payer: Self-pay

## 2020-09-24 ENCOUNTER — Ambulatory Visit: Payer: No Typology Code available for payment source | Attending: Internal Medicine | Admitting: Pharmacist

## 2020-09-24 DIAGNOSIS — Z23 Encounter for immunization: Secondary | ICD-10-CM

## 2020-09-24 NOTE — Progress Notes (Signed)
Patient presents for vaccination against influenza per orders of Dr. Johnson. Consent given. Counseling provided. No contraindications exists. Vaccine administered without incident.  ° °Luke Van Ausdall, PharmD, CPP °Clinical Pharmacist °[facility] °[phone] ° °

## 2020-09-30 ENCOUNTER — Ambulatory Visit: Payer: No Typology Code available for payment source | Admitting: Pharmacist

## 2020-10-01 ENCOUNTER — Ambulatory Visit: Payer: No Typology Code available for payment source | Admitting: Pharmacist

## 2020-10-01 ENCOUNTER — Other Ambulatory Visit: Payer: Self-pay

## 2020-10-01 ENCOUNTER — Ambulatory Visit (INDEPENDENT_AMBULATORY_CARE_PROVIDER_SITE_OTHER): Payer: No Typology Code available for payment source | Admitting: Endocrinology

## 2020-10-01 ENCOUNTER — Encounter: Payer: Self-pay | Admitting: Endocrinology

## 2020-10-01 VITALS — BP 118/76 | HR 76 | Ht 64.0 in | Wt 135.0 lb

## 2020-10-01 DIAGNOSIS — S069X9S Unspecified intracranial injury with loss of consciousness of unspecified duration, sequela: Secondary | ICD-10-CM

## 2020-10-01 DIAGNOSIS — E291 Testicular hypofunction: Secondary | ICD-10-CM

## 2020-10-01 LAB — LUTEINIZING HORMONE: LH: 2.72 m[IU]/mL (ref 1.50–9.30)

## 2020-10-01 LAB — BASIC METABOLIC PANEL
BUN: 25 mg/dL — ABNORMAL HIGH (ref 6–23)
CO2: 29 mEq/L (ref 19–32)
Calcium: 8.7 mg/dL (ref 8.4–10.5)
Chloride: 101 mEq/L (ref 96–112)
Creatinine, Ser: 0.74 mg/dL (ref 0.40–1.50)
GFR: 102.27 mL/min (ref >60.00)
Glucose, Bld: 94 mg/dL (ref 70–99)
Potassium: 4.9 mEq/L (ref 3.5–5.1)
Sodium: 134 mEq/L — ABNORMAL LOW (ref 135–145)

## 2020-10-01 LAB — FOLLICLE STIMULATING HORMONE: FSH: 5.1 m[IU]/mL (ref 1.4–18.1)

## 2020-10-01 LAB — URINALYSIS, ROUTINE W REFLEX MICROSCOPIC
Bilirubin Urine: NEGATIVE
Ketones, ur: NEGATIVE
Leukocytes,Ua: NEGATIVE
Nitrite: NEGATIVE
Specific Gravity, Urine: 1.02 (ref 1.000–1.030)
Urine Glucose: NEGATIVE
Urobilinogen, UA: 0.2 (ref 0.0–1.0)
pH: 6 (ref 5.0–8.0)

## 2020-10-01 LAB — CORTISOL: Cortisol, Plasma: 11.6 ug/dL

## 2020-10-01 LAB — TSH: TSH: 1.27 u[IU]/mL (ref 0.35–4.50)

## 2020-10-01 LAB — T4, FREE: Free T4: 0.95 ng/dL (ref 0.60–1.60)

## 2020-10-01 NOTE — Patient Instructions (Signed)
Blood tests are requested for you today.  We'll let you know about the results.  If these are normal, no further hormonal testing is needed.

## 2020-10-01 NOTE — Progress Notes (Signed)
Subjective:    Patient ID: [name], male    DOB: [birth year], 54 y.o.   MRN: [id number]  HPI Hx is from sister, due to TBI.  Pt is referred by Dr Wynetta Emery, for hypogonadism.  Pt reports he had puberty at the normal age.  He has 1 biological child.  He says he has never taken illicit androgens.  He had ICH in 2018. He was noted to have low testosterone in 2019, and was rx'ed depo-testosterone.  Last dose was 8 months ago.  He does not take antiandrogens or opioids.  He denies any h/o infertility, XRT, or genital infection.  He has no h/o sleep apnea or DVT.  He does not consume alcohol excessively.   Sister says pt has slight weight gain.  She says pt does not have nocturia.   Past Medical History:  Diagnosis Date  . Diabetes mellitus without complication ([place])   . Seizures ([place])   . TBI (traumatic brain injury) ([place]) 03/2017    Past Surgical History:  Procedure Laterality Date  . CRANIOTOMY Right 01/13/2017   Procedure: 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, Placement of bone flap in abdomen;  Surgeon: Ashok Pall, MD;  Location: [facility];  Service: Neurosurgery;  Laterality: Right;  . CRANIOTOMY Right 03/19/2017   Procedure: Cranioplasty with placement of bone flap from abdomen;  Surgeon: Eustace Moore, MD;  Location: [facility];  Service: Neurosurgery;  Laterality: Right;    Social History   Socioeconomic History  . Marital status: Single    Spouse name: Not on file  . Number of children: 1  . Years of education: 60  . Highest education level: High school graduate  Occupational History  . Occupation: not working  Tobacco Use  . Smoking status: Never Smoker  . Smokeless tobacco: Never Used  Vaping Use  . Vaping Use: Never used  Substance and Sexual Activity  . Alcohol use: Yes    Comment: socially, not since accident  . Drug use: No  . Sexual activity: Not on file  Other Topics Concern  . Not on file  Social History Narrative   Lives  with sister and her family.  Has one daughter in [place].  Does not work.  Education: high school.    Social Determinants of Health   Financial Resource Strain:   . Difficulty of Paying Living Expenses: Not on file  Food Insecurity:   . Worried About Charity fundraiser in the Last Year: Not on file  . Ran Out of Food in the Last Year: Not on file  Transportation Needs:   . Lack of Transportation (Medical): Not on file  . Lack of Transportation (Non-Medical): Not on file  Physical Activity:   . Days of Exercise per Week: Not on file  . Minutes of Exercise per Session: Not on file  Stress:   . Feeling of Stress : Not on file  Social Connections:   . Frequency of Communication with Friends and Family: Not on file  . Frequency of Social Gatherings with Friends and Family: Not on file  . Attends Religious Services: Not on file  . Active Member of Clubs or Organizations: Not on file  . Attends Archivist Meetings: Not on file  . Marital Status: Not on file  Intimate Partner Violence:   . Fear of Current or Ex-Partner: Not on file  . Emotionally Abused: Not on file  . Physically Abused: Not on file  . Sexually  Abused: Not on file    Current Outpatient Medications on File Prior to Visit  Medication Sig Dispense Refill  . atorvastatin (LIPITOR) 10 MG tablet Take 1 tablet (10 mg total) by mouth daily. 90 tablet 5  . Blood Glucose Monitoring Suppl (TRUE METRIX METER) w/Device KIT Use as directed 1 kit 0  . cetirizine (ZYRTEC) 10 MG tablet Take 1 tablet (10 mg total) by mouth daily. 30 tablet 11  . FLUoxetine (PROZAC) 10 MG capsule Take 3 capsules daily (30 mg) 90 capsule 2  . glucose blood test strip Use as instructed 100 each 12  . levETIRAcetam (KEPPRA) 500 MG tablet Take 1.5 tablets twice a day 270 tablet 3  . lisinopril (ZESTRIL) 2.5 MG tablet Take 1 tablet (2.5 mg total) by mouth daily. 30 tablet 6  . metFORMIN (GLUCOPHAGE) 1000 MG tablet TAKE 1 TABLET BY MOUTH 2 TIMES  DAILY WITH A MEAL. 180 tablet 6  . TRUEPLUS LANCETS 28G MISC Use as directed 100 each 6   No current facility-administered medications on file prior to visit.    Allergies  Allergen Reactions  . Latex Itching and Rash    Family History  Problem Relation Age of Onset  . Diabetes Mother   . Diabetes Father     BP 118/76   Pulse 76   Ht $R'5\' 4"'UX$  (1.626 m)   Wt 135 lb (61.2 kg)   SpO2 99%   BMI 23.17 kg/m    Review of Systems Pt is unable to report sxs.    Objective:   Physical Exam VS: see vs page GEN: no distress HEAD: head: no deformity eyes: no periorbital swelling, no proptosis external nose and ears are normal NECK: supple, thyroid is not enlarged CHEST WALL: no deformity LUNGS: clear to auscultation BREASTS:  No gynecomastia CV: reg rate and rhythm, no murmur GENITALIA:  Normal male.   MUSCULOSKELETAL: muscle bulk and strength are grossly normal.  no joint swelling is seen  gait is slow and unsteady.  EXTEMITIES: no leg edema NEURO:  cn 2-12 grossly intact.  sensation is intact to touch on all 4's SKIN:  Normal texture and temperature.  No rash or suspicious lesion is visible.  Normal male hair distribution.   NODES:  None palpable at the neck PSYCH: alert, cooperative.  Does not appear anxious nor depressed.    Lab Results  Component Value Date   TSH 1.170 03/22/2018   T4TOTAL 8.4 09/21/2018     Lab Results  Component Value Date   TESTOSTERONE 282 05/14/2020   Lab Results  Component Value Date   WBC 5.8 01/02/2020   HGB 13.0 01/02/2020   HCT 38.2 (L) 01/02/2020   MCV 90.3 01/02/2020   PLT 251 01/02/2020   Multiple CT's: no mention is made of the pituitary.    Assessment & Plan:  ICH: we should check pit function tests Hypogonadism: recheck today.   Patient Instructions  Blood tests are requested for you today.  We'll let you know about the results.  If these are normal, no further hormonal testing is needed.

## 2020-10-03 LAB — PROLACTIN: Prolactin: 2.8 ng/mL (ref 2.0–18.0)

## 2020-10-03 LAB — ACTH: C206 ACTH: 19 pg/mL (ref 6–50)

## 2020-10-10 LAB — ARGININE VASOPRESSIN HORMONE
ADH: <0.8 pg/mL (ref 0.0–4.7)
Osmolality Meas: 276 mOsmol/kg (ref 275–295)

## 2020-10-10 LAB — TESTOSTERONE,FREE AND TOTAL
Testosterone, Free: 8.1 pg/mL (ref 7.2–24.0)
Testosterone: 380 ng/dL (ref 264–916)

## 2020-10-10 MED FILL — ?ATORVASTATIN 10 MG TABLET: 10 | 30 days supply | Qty: 30 | Fill #1

## 2020-10-10 MED FILL — METFORMIN HCL 1000 MG TABS: 1000 | 30 days supply | Qty: 60 | Fill #1

## 2020-10-10 MED FILL — LISINOPRIL 2.5 MG TABLET: 2.5 | 30 days supply | Qty: 30 | Fill #3

## 2020-10-10 MED FILL — levETIRAcetam 500 MG TABS: 500 | 30 days supply | Qty: 90 | Fill #3

## 2020-10-10 MED FILL — FLUoxetine HCL 10 MG CAPS: 10 | 30 days supply | Qty: 90 | Fill #1

## 2020-11-12 ENCOUNTER — Other Ambulatory Visit: Payer: Self-pay

## 2020-11-12 ENCOUNTER — Ambulatory Visit (INDEPENDENT_AMBULATORY_CARE_PROVIDER_SITE_OTHER): Payer: No Payment, Other | Admitting: Psychiatry

## 2020-11-12 ENCOUNTER — Other Ambulatory Visit (HOSPITAL_COMMUNITY): Payer: Self-pay | Admitting: Psychiatry

## 2020-11-12 ENCOUNTER — Encounter (HOSPITAL_COMMUNITY): Payer: Self-pay | Admitting: Psychiatry

## 2020-11-12 DIAGNOSIS — F3341 Major depressive disorder, recurrent, in partial remission: Secondary | ICD-10-CM | POA: Diagnosis not present

## 2020-11-12 MED ORDER — FLUOXETINE HCL 40 MG PO CAPS: 40.0000 mg | ORAL_CAPSULE | Freq: Every day | ORAL | 2 refills | Status: DC

## 2020-11-12 MED FILL — METFORMIN HCL 1000 MG TABS: 1000 | 30 days supply | Qty: 60 | Fill #2

## 2020-11-12 MED FILL — ?ATORVASTATIN 10 MG TABLET: 10 | 30 days supply | Qty: 30 | Fill #2

## 2020-11-12 MED FILL — FLUoxetine HCL 10 MG CAPS: 10 | 30 days supply | Qty: 90 | Fill #2

## 2020-11-12 MED FILL — LISINOPRIL 2.5 MG TABLET: 2.5 | 30 days supply | Qty: 30 | Fill #4

## 2020-11-12 MED FILL — levETIRAcetam 500 MG TABS: 500 | 30 days supply | Qty: 90 | Fill #4

## 2020-11-12 NOTE — Progress Notes (Signed)
[facility] Progress Note   Patient Identification: [name] MRN:  [id number] Date of Evaluation:  11/12/2020    Chief Complaint: As per sister, " He is not taking care of himself."  Visit Diagnosis:    ICD-10-CM   1. MDD (major depressive disorder), recurrent, in partial remission (HCC)  F33.41 FLUoxetine (PROZAC) 40 MG capsule    History of Present Illness: Patient presented with his sister for follow-up. Interview was conducted in [place] with the help of interpretation by [place] translator via [employer] services.  Sister informed the patient is kind of getting worse.  She stated that it is very hard for him to walk and therefore he just stays in one place.  She stated that recently she found out that he was not taking a bath or shower.  She informed that every day he will go Into the bathroom, turn on the tap but will just stand there. He will turn on the cold water tap on some occassions and then shivered in the bathroom acting clueless about the next step. Sister stated that lately she is the one who has been giving him showers and helping him clean up after himself lately.  She has to turn the water tap and adjust the water temp so that it is not too cold. She stated that despite being encouraged to do so patient does not want to use his cane.  She informed that he was not given a walker because of shakiness in his upper extremity which makes it difficult for him to use the walker. He has been recommended to use the cane around the house to ambulate but he chooses not to use it.  Sister started crying while expressing her frustration about having to worry about the patient nonstop.  She stated that they encouraged him to come out of the house with the family but he chooses not to.  She stated that sometimes she is in a rush and she forgets to grab his cane and when he goes out it is very difficult to ensure that he is safe and  will not have another fall.  She stated that lately he seems to be confined to his chair.  He does enjoy watching comedy shows on TV but other than that he does not do anything around the house.  He needs to be reminded to take care of himself and his grooming and hygiene.  Patient was noted to be depressed and did not make good eye contact today.  When asked if he was having any suicidal thoughts he denied any.  When asked what was the reason for him not to use the cane, he replied, " I feel uncomfortable." Writer suggested the patient that he should try using the cane to ambulate around the house to prevent any further falls.  Patient kept saying all right in Spanish and did not protest or say anything else.  He continues to have broad-based gait when he walks.  Writer recommended that we go up on the dose of Prozac to target his depressive symptoms.  Sister was in agreement with this recommendation.  Past Psychiatric History: MDD, recent hospitalization in March due to suicidal ideations.   Past Medical History:  Past Medical History:  Diagnosis Date  . Diabetes mellitus without complication ([place])   . Seizures ([place])   . TBI (traumatic brain injury) ([place]) 03/2017    Past Surgical History:  Procedure Laterality Date  . CRANIOTOMY Right 01/13/2017   Procedure: RIGHT FRONTO  TEMPORAL PARIETAL CRANIECTOMY FOR HEMATOMA EVACUATION, Placement of bone flap in abdomen;  Surgeon: Ashok Pall, MD;  Location: [facility];  Service: Neurosurgery;  Laterality: Right;  . CRANIOTOMY Right 03/19/2017   Procedure: Cranioplasty with placement of bone flap from abdomen;  Surgeon: Eustace Moore, MD;  Location: [facility];  Service: Neurosurgery;  Laterality: Right;    Family Psychiatric History: denied  Family History:  Family History  Problem Relation Age of Onset  . Diabetes Mother   . Diabetes Father     Social History:   Social History   Socioeconomic History  . Marital status: Single    Spouse name:  Not on file  . Number of children: 1  . Years of education: 57  . Highest education level: High school graduate  Occupational History  . Occupation: not working  Tobacco Use  . Smoking status: Never Smoker  . Smokeless tobacco: Never Used  Vaping Use  . Vaping Use: Never used  Substance and Sexual Activity  . Alcohol use: Yes    Comment: socially, not since accident  . Drug use: No  . Sexual activity: Not on file  Other Topics Concern  . Not on file  Social History Narrative   Lives with sister and her family.  Has one daughter in [place].  Does not work.  Education: high school.    Social Determinants of Health   Financial Resource Strain: Not on file  Food Insecurity: Not on file  Transportation Needs: Not on file  Physical Activity: Not on file  Stress: Not on file  Social Connections: Not on file    Additional Social History: Lives with sister and  her family  Allergies:   Allergies  Allergen Reactions  . Latex Itching and Rash    Metabolic Disorder Labs: Lab Results  Component Value Date   HGBA1C 5.2 05/14/2020   MPG 252 01/10/2017   Lab Results  Component Value Date   PROLACTIN 2.8 10/01/2020   Lab Results  Component Value Date   CHOL 143 04/20/2019   TRIG 81 04/20/2019   HDL 54 04/20/2019   CHOLHDL 2.6 04/20/2019   VLDL NOT CALC 06/02/2016   LDLCALC 73 04/20/2019   LDLCALC 117 (H) 09/09/2018   Lab Results  Component Value Date   TSH 1.27 10/01/2020    Therapeutic Level Labs: No results found for: LITHIUM No results found for: CBMZ No results found for: VALPROATE  Current Medications: Current Outpatient Medications  Medication Sig Dispense Refill  . FLUoxetine (PROZAC) 40 MG capsule Take 1 capsule (40 mg total) by mouth daily. 30 capsule 2  . atorvastatin (LIPITOR) 10 MG tablet Take 1 tablet (10 mg total) by mouth daily. 90 tablet 5  . Blood Glucose Monitoring Suppl (TRUE METRIX METER) w/Device KIT Use as directed 1 kit 0  . cetirizine  (ZYRTEC) 10 MG tablet Take 1 tablet (10 mg total) by mouth daily. 30 tablet 11  . glucose blood test strip Use as instructed 100 each 12  . levETIRAcetam (KEPPRA) 500 MG tablet Take 1.5 tablets twice a day 270 tablet 3  . lisinopril (ZESTRIL) 2.5 MG tablet Take 1 tablet (2.5 mg total) by mouth daily. 30 tablet 6  . metFORMIN (GLUCOPHAGE) 1000 MG tablet TAKE 1 TABLET BY MOUTH 2 TIMES DAILY WITH A MEAL. 180 tablet 6  . TRUEPLUS LANCETS 28G MISC Use as directed 100 each 6   No current facility-administered medications for this visit.    Musculoskeletal: Strength & Muscle Tone:  within normal limits Gait & Station: unsteady, broad based Patient leans: N/A  Psychiatric Specialty Exam: Review of Systems  There were no vitals taken for this visit.There is no height or weight on file to calculate BMI.  General Appearance: Fairly Groomed  Eye Contact:  Fair  Speech:  Slow  Volume:  Normal  Mood:  Depressed  Affect:  Congruent  Thought Process:  Goal Directed and Descriptions of Associations: Intact  Orientation:  Full (Time, Place, and Person)  Thought Content:  Logical  Suicidal Thoughts:  No  Homicidal Thoughts:  No  Memory:  Immediate;   Good Recent;   Fair  Judgement:  Fair  Insight:  Fair  Psychomotor Activity:  Normal and Wide Base gait  Concentration:  Concentration: Fair and Attention Span: Fair  Recall:  [employer] of Knowledge:Fair  Language: Good  Akathisia:  Negative  Handed:  Right  AIMS (if indicated):  not done  Assets:  Communication Skills Desire for Improvement Financial Resources/Insurance Housing  ADL's:  Intact, needs supervision  Cognition: WNL  Sleep:  Fair   Screenings: GAD-7   [facility] Office Visit from 05/14/2020 in [facility] Office Visit from 12/13/2018 in [facility] Office Visit from 07/28/2018 in [facility] Office Visit from 06/16/2018 in [facility] Office Visit from 03/04/2018 in [facility]  Total GAD-7 Score 4 0 2 0 0    PHQ2-9   Flowsheet Row Telemedicine from 09/17/2020 in [facility] from 06/04/2020 in [facility] Office Visit from 07/14/2019 in [facility] Office Visit from 12/13/2018 in [facility] Office Visit from 09/21/2018 in [facility]  PHQ-2 Total Score 0 5 4 0 3  PHQ-9 Total Score - 9 11 - 6      Assessment and Plan: Patient appeared to be depressed and sad looking today.  We will adjust his dose of Prozac to 40 mg for optimal effect.  Patient was emphasized the relevance of using his cane for ambulation.  Patient verbalized his understanding.   1. MDD (major depressive disorder), recurrent, in partial remission (HCC)  - Increase FLUoxetine (PROZAC) 40 MG capsule; Take 1 capsule (40 mg total) by mouth daily.  Dispense: 30 capsule; Refill: 2  F/up in 10 weeks.  Nevada Crane, MD 1/11/20221:43 PM

## 2020-12-11 MED FILL — METFORMIN HCL 1000 MG TABS: 1000 | 30 days supply | Qty: 60 | Fill #3

## 2020-12-11 MED FILL — levETIRAcetam 500 MG TABS: 500 | 30 days supply | Qty: 90 | Fill #5

## 2020-12-11 MED FILL — ?ATORVASTATIN 10 MG TABLET: 10 | 30 days supply | Qty: 30 | Fill #3

## 2020-12-11 MED FILL — LISINOPRIL 2.5 MG TABLET: 2.5 | 30 days supply | Qty: 30 | Fill #5

## 2020-12-12 MED FILL — FLUoxetine HCL 40 MG CAPS: 40 | 30 days supply | Qty: 30 | Fill #0

## 2021-01-01 ENCOUNTER — Telehealth (HOSPITAL_COMMUNITY): Payer: Self-pay | Admitting: Psychiatry

## 2021-01-01 ENCOUNTER — Other Ambulatory Visit (HOSPITAL_COMMUNITY): Payer: Self-pay | Admitting: Psychiatry

## 2021-01-01 MED ORDER — FLUOXETINE HCL 10 MG PO CAPS: ORAL_CAPSULE | ORAL | 2 refills | Status: DC

## 2021-01-01 MED FILL — FLUoxetine HCL 10 MG CAPS: 10 | 30 days supply | Qty: 90 | Fill #0

## 2021-01-01 NOTE — Telephone Encounter (Signed)
I called and spoke with patient sister.  She informed that patient was more agitated when the dose of Prozac was increased to 40 mg.  She started giving him 30 mg dose after few days and he seems to be at his baseline.  She stated that he still has a hard time completing any tasks that he starts.  However he is not as irritable or agitated anymore.  She requested refills for 30 mg dose.  Writer informed her that new prescription is being sent today.

## 2021-01-09 MED FILL — levETIRAcetam 500 MG TABS: 500 | 30 days supply | Qty: 90 | Fill #6

## 2021-01-09 MED FILL — ?METFORMIN HCL 1000 MG TAB: 1000 | 30 days supply | Qty: 60 | Fill #4

## 2021-01-09 MED FILL — LISINOPRIL 2.5 MG TABLET: 2.5 | 30 days supply | Qty: 30 | Fill #6

## 2021-01-09 MED FILL — ?ATORVASTATIN 10 MG TABLET: 10 | 30 days supply | Qty: 30 | Fill #4

## 2021-01-21 ENCOUNTER — Ambulatory Visit (HOSPITAL_COMMUNITY): Payer: No Payment, Other | Admitting: Psychiatry

## 2021-01-23 ENCOUNTER — Encounter: Payer: Self-pay | Admitting: Internal Medicine

## 2021-01-23 ENCOUNTER — Other Ambulatory Visit: Payer: Self-pay

## 2021-01-23 ENCOUNTER — Other Ambulatory Visit: Payer: Self-pay | Admitting: Internal Medicine

## 2021-01-23 ENCOUNTER — Ambulatory Visit: Payer: Self-pay | Attending: Internal Medicine | Admitting: Internal Medicine

## 2021-01-23 VITALS — BP 132/77 | HR 74 | Resp 16 | Wt 134.0 lb

## 2021-01-23 DIAGNOSIS — R809 Proteinuria, unspecified: Secondary | ICD-10-CM

## 2021-01-23 DIAGNOSIS — Z1211 Encounter for screening for malignant neoplasm of colon: Secondary | ICD-10-CM

## 2021-01-23 DIAGNOSIS — D649 Anemia, unspecified: Secondary | ICD-10-CM

## 2021-01-23 DIAGNOSIS — E1129 Type 2 diabetes mellitus with other diabetic kidney complication: Secondary | ICD-10-CM

## 2021-01-23 DIAGNOSIS — E785 Hyperlipidemia, unspecified: Secondary | ICD-10-CM

## 2021-01-23 DIAGNOSIS — G40909 Epilepsy, unspecified, not intractable, without status epilepticus: Secondary | ICD-10-CM

## 2021-01-23 DIAGNOSIS — L602 Onychogryphosis: Secondary | ICD-10-CM

## 2021-01-23 DIAGNOSIS — R3129 Other microscopic hematuria: Secondary | ICD-10-CM

## 2021-01-23 DIAGNOSIS — E1169 Type 2 diabetes mellitus with other specified complication: Secondary | ICD-10-CM

## 2021-01-23 MED ORDER — LEVETIRACETAM 500 MG PO TABS: ORAL_TABLET | ORAL | 3 refills | Status: DC

## 2021-01-23 NOTE — Progress Notes (Addendum)
Patient ID: [name], male    DOB: 1965-12-26  MRN: [id number]  CC: Diabetes and Hypertension   Subjective: [name] is a 55 y.o. male who presents for chronic disease management.  His sister, [name], is with him and provides most of the history and interprets. 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  Saw Dr. Loanne Berg for low testosterone.  Repeat level was okay.  He was found to have microscopic hematuria.  Told to follow-up with PCP on this.  Patient denies dysuria or gross hematuria.  His sister has noticed that he has frequent urination in the mornings but no polyuria at nights.  Patient reports that he is urinating without difficulty.  Still seeing psychiatrist Dr. Toy Berg.  He is on Prozac.  Does good some days in getting up and doing the routine his sister has established for him.  His alarm is set to get up at 8 AM in the mornings.  Other days he is confused and waits for sister to give directions.  No seizures since last visit.  His sister ensures that he takes the [place] as prescribed.    DM: sister controls his eating otherwise patient tries to overeat especially on sweets stuff. Checks BS 3x/wk.  Range 130-140 before meals.  Compliant with taking Metformin.  Denies any blurred vision.  HL: Taking and tolerating atorvastatin.  HM: due for COVID booster.  Due for FIT test Patient Active Problem List   Diagnosis Date Noted  . MDD (major depressive disorder), recurrent, in partial remission ([facility]) 11/12/2020  . MDD (major depressive disorder), recurrent episode, moderate ([facility]) 07/16/2020  . Influenza vaccine needed 07/14/2019  . Microalbuminuria due to type 2 diabetes mellitus ([facility]) 04/11/2019  . Hypothyroidism 09/21/2018  . Seizure disorder ([place]) 06/16/2018   . Low testosterone 06/16/2018  . Traumatic brain injury with loss of consciousness ([facility]) 10/29/2017  . Anemia 10/29/2017  . Positive depression screening 10/29/2017  . Attention and concentration deficit   . Type 2 diabetes mellitus with peripheral neuropathy (HCC)   . S/P craniotomy 03/19/2017  . Essential hypertension   . Dysphagia, oropharyngeal 02/01/2017  . Diabetes type 2, uncontrolled ([facility]) 06/13/2015     Current Outpatient Medications on File Prior to Visit  Medication Sig Dispense Refill  . atorvastatin (LIPITOR) 10 MG tablet Take 1 tablet (10 mg total) by mouth daily. 90 tablet 5  . Blood Glucose Monitoring Suppl (TRUE METRIX METER) w/Device KIT Use as directed 1 kit 0  . cetirizine (ZYRTEC) 10 MG tablet Take 1 tablet (10 mg total) by mouth daily. 30 tablet 11  . FLUoxetine (PROZAC) 10 MG capsule Take 3 capsules daily (30 mg). 90 capsule 2  . glucose blood test strip Use as instructed 100 each 12  . levETIRAcetam (KEPPRA) 500 MG tablet Take 1.5 tablets twice a day 270 tablet 3  . lisinopril (ZESTRIL) 2.5 MG tablet Take 1 tablet (2.5 mg total) by mouth daily. 30 tablet 6  . metFORMIN (GLUCOPHAGE) 1000 MG tablet TAKE 1 TABLET BY MOUTH 2 TIMES DAILY WITH A MEAL. 180 tablet 6  . TRUEPLUS LANCETS 28G MISC Use as directed 100 each 6   No current facility-administered medications on file prior to visit.    Allergies  Allergen Reactions  . Latex Itching and Rash    Social History   Socioeconomic History  . Marital status:  Single    Spouse name: Not on file  . Number of children: 1  . Years of education: 42  . Highest education level: High school graduate  Occupational History  . Occupation: not working  Tobacco Use  . Smoking status: Never Smoker  . Smokeless tobacco: Never Used  Vaping Use  . Vaping Use: Never used  Substance and Sexual Activity  . Alcohol use: Yes    Comment: socially, not since accident  . Drug use: No  . Sexual activity: Not on file  Other  Topics Concern  . Not on file  Social History Narrative   Lives with sister and her family.  Has one daughter in [place].  Does not work.  Education: high school.    Social Determinants of Health   Financial Resource Strain: Not on file  Food Insecurity: Not on file  Transportation Needs: Not on file  Physical Activity: Not on file  Stress: Not on file  Social Connections: Not on file  Intimate Partner Violence: Not on file    Family History  Problem Relation Age of Onset  . Diabetes Mother   . Diabetes Father     Past Surgical History:  Procedure Laterality Date  . CRANIOTOMY Right 01/13/2017   Procedure: 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, Placement of bone flap in abdomen;  Surgeon: Ashok Pall, MD;  Location: [facility];  Service: Neurosurgery;  Laterality: Right;  . CRANIOTOMY Right 03/19/2017   Procedure: Cranioplasty with placement of bone flap from abdomen;  Surgeon: Eustace Moore, MD;  Location: [facility];  Service: Neurosurgery;  Laterality: Right;    ROS: Review of Systems Negative except as stated above  PHYSICAL EXAM: BP 132/77   Pulse 74   Resp 16   Wt 134 lb (60.8 kg)   SpO2 99%   BMI 23.00 kg/m   Wt Readings from Last 3 Encounters:  01/23/21 134 lb (60.8 kg)  10/01/20 135 lb (61.2 kg)  05/14/20 129 lb 12.8 oz (58.9 kg)    Physical Exam  General appearance - alert, well appearing, and in no distress Mental status - normal mood, behavior, speech, dress, motor activity, and thought processes Neck - supple, no significant adenopathy Chest - clear to auscultation, no wheezes, rales or rhonchi, symmetric air entry Heart - normal rate, regular rhythm, normal S1, S2, no murmurs, rubs, clicks or gallops Extremities - peripheral pulses normal, no pedal edema, no clubbing or cyanosis Diabetic Foot Exam - Simple   Simple Foot Form Visual Inspection See comments: Yes Sensation Testing Intact to touch and monofilament testing  bilaterally: Yes Pulse Check Posterior Tibialis and Dorsalis pulse intact bilaterally: Yes Comments Toenails are thickened discolored.  Toenails on both big toes are chipped and overgrown.     CMP Latest Ref Rng & Units 10/01/2020 01/02/2020 04/20/2019  Glucose 70 - 99 mg/dL 94 92 115(H)  BUN 6 - 23 mg/dL 25(H) 17 16  Creatinine 0.40 - 1.50 mg/dL 0.74 0.74 0.77  Sodium 135 - 145 mEq/L 134(L) 136 139  Potassium 3.5 - 5.1 mEq/L 4.9 3.7 4.1  Chloride 96 - 112 mEq/L 101 103 103  CO2 19 - 32 mEq/L _0 Calcium 8.4 - 10.5 mg/dL 8.7 8.7(L) 8.4(L)  Total Protein 6.5 - 8.1 g/dL - 7.1 6.6  Total Bilirubin 0.3 - 1.2 mg/dL - 0.6 0.6  Alkaline Phos 38 - 126 U/L - 42 47  AST 15 - 41 U/L - 15 15  ALT 0 -  44 U/L - 20 22   Lipid Panel     Component Value Date/Time   CHOL 143 04/20/2019 0914   TRIG 81 04/20/2019 0914   HDL 54 04/20/2019 0914   CHOLHDL 2.6 04/20/2019 0914   CHOLHDL 5.2 (H) 06/02/2016 1232   VLDL NOT CALC 06/02/2016 1232   LDLCALC 73 04/20/2019 0914    CBC    Component Value Date/Time   WBC 5.8 01/02/2020 2249   RBC 4.23 01/02/2020 2249   HGB 13.0 01/02/2020 2249   HGB 14.1 04/20/2019 0914   HCT 38.2 (L) 01/02/2020 2249   HCT 40.2 04/20/2019 0914   PLT 251 01/02/2020 2249   PLT 229 04/20/2019 0914   MCV 90.3 01/02/2020 2249   MCV 88 04/20/2019 0914   MCH 30.7 01/02/2020 2249   MCHC 34.0 01/02/2020 2249   RDW 12.3 01/02/2020 2249   RDW 12.8 04/20/2019 0914   LYMPHSABS 1.1 05/10/2018 1849   LYMPHSABS 1.4 04/01/2017 1444   MONOABS 0.3 05/10/2018 1849   EOSABS 0.1 05/10/2018 1849   EOSABS 0.1 04/01/2017 1444   BASOSABS 0.0 05/10/2018 1849   BASOSABS 0.0 04/01/2017 1444    ASSESSMENT AND PLAN: 1. Type 2 diabetes mellitus with microalbuminuria, without long-term current use of insulin ([place]) Reported blood sugars are at goal.  Continue Metformin.  Encourage healthy eating habits.  Advised patient to eat healthier snacks like fruits or nuts. - Hemoglobin A1c -  CBC  2. Seizure disorder (HCC) Controlled on Keppra. - levETIRAcetam (KEPPRA) 500 MG tablet; Take 1.5 tablets twice a day  Dispense: 270 tablet; Refill: 3  3. Microscopic hematuria We will repeat urinalysis today - Urinalysis, Routine w reflex microscopic  4. Hyperlipidemia associated with type 2 diabetes mellitus (HCC) Continue atorvastatin - Lipid panel - Hepatic function panel  5. Screening for colon cancer - Fecal occult blood, imunochemical([facility])  6. Overgrown toenails - Ambulatory referral to Podiatry  Addendum:  Pt with normocytic anemia on blood tests.  We will add iron, vitamin I45 and folic acid levels to labs. Patient was given the opportunity to ask questions.  Patient verbalized understanding of the plan and was able to repeat key elements of the plan.   No orders of the defined types were placed in this encounter.    Requested Prescriptions    No prescriptions requested or ordered in this encounter    No follow-ups on file.  Karle Plumber, MD, FACP

## 2021-01-24 LAB — HEPATIC FUNCTION PANEL
ALT: 21 IU/L (ref 0–44)
AST: 19 IU/L (ref 0–40)
Albumin: 4.4 g/dL (ref 3.8–4.9)
Alkaline Phosphatase: 56 IU/L (ref 44–121)
Bilirubin Total: 0.3 mg/dL (ref 0.0–1.2)
Bilirubin, Direct: <0.1 mg/dL (ref 0.00–0.40)
Total Protein: 6.7 g/dL (ref 6.0–8.5)

## 2021-01-24 LAB — URINALYSIS, ROUTINE W REFLEX MICROSCOPIC
Bilirubin, UA: NEGATIVE
Glucose, UA: NEGATIVE
Ketones, UA: NEGATIVE
Leukocytes,UA: NEGATIVE
Nitrite, UA: NEGATIVE
RBC, UA: NEGATIVE
Specific Gravity, UA: 1.016 (ref 1.005–1.030)
Urobilinogen, Ur: 0.2 mg/dL (ref 0.2–1.0)
pH, UA: 6 (ref 5.0–7.5)

## 2021-01-24 LAB — HEMOGLOBIN A1C
Est. average glucose Bld gHb Est-mCnc: 105 mg/dL
Hgb A1c MFr Bld: 5.3 % (ref 4.8–5.6)

## 2021-01-24 LAB — MICROSCOPIC EXAMINATION
Bacteria, UA: NONE SEEN
Casts: NONE SEEN /lpf
Epithelial Cells (non renal): NONE SEEN /hpf (ref 0–10)
RBC, Urine: NONE SEEN /hpf (ref 0–2)

## 2021-01-24 LAB — CBC
Hematocrit: 33.9 % — ABNORMAL LOW (ref 37.5–51.0)
Hemoglobin: 11.7 g/dL — ABNORMAL LOW (ref 13.0–17.7)
MCH: 30.2 pg (ref 26.6–33.0)
MCHC: 34.5 g/dL (ref 31.5–35.7)
MCV: 88 fL (ref 79–97)
Platelets: 226 10*3/uL (ref 150–450)
RBC: 3.87 x10E6/uL — ABNORMAL LOW (ref 4.14–5.80)
RDW: 12.5 % (ref 11.6–15.4)
WBC: 6.2 10*3/uL (ref 3.4–10.8)

## 2021-01-24 LAB — LIPID PANEL
Chol/HDL Ratio: 2.7 ratio (ref 0.0–5.0)
Cholesterol, Total: 184 mg/dL (ref 100–199)
HDL: 67 mg/dL (ref >39)
LDL Chol Calc (NIH): 99 mg/dL (ref 0–99)
Triglycerides: 103 mg/dL (ref 0–149)
VLDL Cholesterol Cal: 18 mg/dL (ref 5–40)

## 2021-01-25 NOTE — Progress Notes (Signed)
Let patient sister know that repeat urinalysis shows no blood in the urine.  His blood cell count shows that he has a new anemia when compared to blood cell count that was done 1 year ago.  I will asked the lab to add iron, vitamin B12 and folate level to see whether there is any vitamin deficiency.  Recent LDL cholesterol is 99 with goal being less than 70 in people with diabetes.  I recommend increasing the atorvastatin from 10 mg daily to 20 mg daily.  His liver function test is normal.

## 2021-01-25 NOTE — Addendum Note (Signed)
Addended by: Jonah Blue B on: 01/25/2021 05:47 PM   Modules accepted: Orders

## 2021-01-27 ENCOUNTER — Telehealth: Payer: Self-pay | Admitting: *Deleted

## 2021-01-27 NOTE — Telephone Encounter (Signed)
-----   Message from Marcine Matar, MD sent at 01/25/2021  5:44 PM EDT ----- Let patient sister know that repeat urinalysis shows no blood in the urine.  His blood cell count shows that he has a new anemia when compared to blood cell count that was done 1 year ago.  I will asked the lab to add iron, vitamin B12 and folate level to see whether there is any vitamin deficiency.  Recent LDL cholesterol is 99 with goal being less than 70 in people with diabetes.  I recommend increasing the atorvastatin from 10 mg daily to 20 mg daily.  His liver function test is normal.

## 2021-01-27 NOTE — Telephone Encounter (Signed)
PCP has added lab draw with LAB TECH in office.

## 2021-01-27 NOTE — Telephone Encounter (Signed)
Medical Assistant used [employer] to contact patient.  Interpreter Name: Jerlyn Ly #: [id number] Patients sister is aware of no blood being noted in the urine. Patients sister is aware of anemia being noted and a iron panel being completed to determine the deficiency.

## 2021-01-29 ENCOUNTER — Other Ambulatory Visit: Payer: Self-pay | Admitting: Internal Medicine

## 2021-01-29 LAB — SPECIMEN STATUS REPORT

## 2021-01-29 LAB — IRON,TIBC AND FERRITIN PANEL
Ferritin: 112 ng/mL (ref 30–400)
Iron Saturation: 28 % (ref 15–55)
Iron: 82 ug/dL (ref 38–169)
Total Iron Binding Capacity: 289 ug/dL (ref 250–450)
UIBC: 207 ug/dL (ref 111–343)

## 2021-01-29 LAB — FOLATE: Folate: 7.5 ng/mL (ref >3.0)

## 2021-01-29 LAB — VITAMIN B12: Vitamin B-12: 322 pg/mL (ref 232–1245)

## 2021-01-29 MED ORDER — VITAMIN B12 500 MCG PO TABS: 500.0000 ug | ORAL_TABLET | Freq: Every day | ORAL | 1 refills | Status: DC

## 2021-01-29 NOTE — Progress Notes (Signed)
Let patient's sister know that his iron studies show that he has anemia associated with chronic medical conditions.  His vitamin B12 level is in the low normal range.  He should take vitamin B12 supplement daily.  I will send a prescription to our pharmacy for her to pick up.  Also given his anemia, they should consider doing the colonoscopy this year for colon cancer screening instead of the stool kit test.  Let me know if they are agreeable and I can send a referral to the gastroenterologist.

## 2021-02-05 ENCOUNTER — Other Ambulatory Visit: Payer: Self-pay

## 2021-02-05 ENCOUNTER — Ambulatory Visit (INDEPENDENT_AMBULATORY_CARE_PROVIDER_SITE_OTHER): Payer: No Typology Code available for payment source | Admitting: Podiatry

## 2021-02-05 DIAGNOSIS — M2042 Other hammer toe(s) (acquired), left foot: Secondary | ICD-10-CM

## 2021-02-05 DIAGNOSIS — M79674 Pain in right toe(s): Secondary | ICD-10-CM

## 2021-02-05 DIAGNOSIS — M2041 Other hammer toe(s) (acquired), right foot: Secondary | ICD-10-CM

## 2021-02-05 DIAGNOSIS — E0843 Diabetes mellitus due to underlying condition with diabetic autonomic (poly)neuropathy: Secondary | ICD-10-CM

## 2021-02-05 DIAGNOSIS — M79675 Pain in left toe(s): Secondary | ICD-10-CM

## 2021-02-05 DIAGNOSIS — B351 Tinea unguium: Secondary | ICD-10-CM

## 2021-02-05 NOTE — Progress Notes (Signed)
   SUBJECTIVE Patient with a history of diabetes mellitus presents to office today complaining of elongated, thickened nails that cause pain while ambulating in shoes.  He is unable to trim his own nails. Patient is here for further evaluation and treatment.   Past Medical History:  Diagnosis Date  . Diabetes mellitus without complication (HCC)   . Seizures (HCC)   . TBI (traumatic brain injury) (HCC) 03/2017    OBJECTIVE General Patient is awake, alert, and oriented x 3 and in no acute distress. Derm Skin is dry and supple bilateral. Negative open lesions or macerations. Remaining integument unremarkable. Nails are tender, long, thickened and dystrophic with subungual debris, consistent with onychomycosis, 1-5 bilateral. No signs of infection noted. Vasc  DP and PT pedal pulses palpable bilaterally. Temperature gradient within normal limits.  Neuro Epicritic and protective threshold sensation diminished bilaterally.  Musculoskeletal Exam No symptomatic pedal deformities noted bilateral. Muscular strength within normal limits.  ASSESSMENT 1. Diabetes Mellitus w/ peripheral neuropathy 2. Onychomycosis of nail due to dermatophyte bilateral 3. Pain in foot bilateral  PLAN OF CARE 1. Patient evaluated today. 2. Instructed to maintain good pedal hygiene and foot care. Stressed importance of controlling blood sugar.  3. Mechanical debridement of nails 1-5 bilaterally performed using a nail nipper. Filed with dremel without incident.  4.  OTC formula 3 antifungal topical dispensed  5.  Return to clinic as needed    Felecia Shelling, DPM [facility]  Dr. Felecia Shelling, DPM    [address]                                        [place], [place] [address]                Office [phone]  Fax [phone]

## 2021-02-11 ENCOUNTER — Other Ambulatory Visit: Payer: Self-pay

## 2021-02-11 ENCOUNTER — Other Ambulatory Visit: Payer: Self-pay | Admitting: Internal Medicine

## 2021-02-11 DIAGNOSIS — I10 Essential (primary) hypertension: Secondary | ICD-10-CM

## 2021-02-11 DIAGNOSIS — E1129 Type 2 diabetes mellitus with other diabetic kidney complication: Secondary | ICD-10-CM

## 2021-02-11 MED ORDER — LISINOPRIL 2.5 MG PO TABS
ORAL_TABLET | Freq: Every day | ORAL | 6 refills | Status: DC
  Filled 2021-02-11: qty 30, 30d supply, fill #0
  Filled 2021-03-18: qty 30, 30d supply, fill #1
  Filled 2021-04-08 – 2021-04-16 (×2): qty 30, 30d supply, fill #2
  Filled 2021-05-20: qty 30, 30d supply, fill #3
  Filled 2021-06-19: qty 30, 30d supply, fill #4
  Filled 2021-07-24: qty 30, 30d supply, fill #5
  Filled 2021-08-21: qty 30, 30d supply, fill #6

## 2021-02-11 MED FILL — Metformin HCl Tab 1000 MG: ORAL | 30 days supply | Qty: 60 | Fill #0 | Status: AC

## 2021-02-11 MED FILL — Levetiracetam Tab 500 MG: ORAL | 30 days supply | Qty: 90 | Fill #0 | Status: AC

## 2021-02-11 MED FILL — Fluoxetine HCl Cap 10 MG: ORAL | 30 days supply | Qty: 90 | Fill #0 | Status: AC

## 2021-02-11 MED FILL — Atorvastatin Calcium Tab 10 MG (Base Equivalent): ORAL | 30 days supply | Qty: 30 | Fill #0 | Status: AC

## 2021-02-13 ENCOUNTER — Other Ambulatory Visit: Payer: Self-pay

## 2021-02-19 ENCOUNTER — Ambulatory Visit: Payer: No Typology Code available for payment source

## 2021-02-20 ENCOUNTER — Encounter (HOSPITAL_COMMUNITY): Payer: Self-pay | Admitting: Psychiatry

## 2021-02-20 ENCOUNTER — Other Ambulatory Visit: Payer: Self-pay

## 2021-02-20 ENCOUNTER — Ambulatory Visit (INDEPENDENT_AMBULATORY_CARE_PROVIDER_SITE_OTHER): Payer: No Payment, Other | Admitting: Psychiatry

## 2021-02-20 VITALS — BP 128/70 | HR 86 | Ht 64.0 in | Wt 132.0 lb

## 2021-02-20 DIAGNOSIS — F3341 Major depressive disorder, recurrent, in partial remission: Secondary | ICD-10-CM | POA: Diagnosis not present

## 2021-02-20 MED ORDER — FLUOXETINE HCL 10 MG PO CAPS
ORAL_CAPSULE | ORAL | 2 refills | Status: DC
  Filled 2021-02-20: qty 90, fill #0
  Filled 2021-03-18: qty 90, 30d supply, fill #0
  Filled 2021-04-08 – 2021-04-16 (×2): qty 90, 30d supply, fill #1

## 2021-02-20 NOTE — Progress Notes (Signed)
[facility] OP Progress Note   Patient Identification: [name] MRN:  [id number] Date of Evaluation:  02/20/2021    Chief Complaint: As per sister, "he is doing a little better."  Visit Diagnosis:    ICD-10-CM   1. MDD (major depressive disorder), recurrent, in partial remission ([place])  F33.41     History of Present Illness: Patient presented with his sister for follow-up. Interview was conducted in [place] with the help of Spanish translator Ms. Claudia from [employer] [facility].  Patient was seen a few months ago and at that time sister had reported worsening of depression symptoms and his dose of Prozac was increased to 40 mg.  However a few weeks later the sister had contacted the office to inform that she had noticed that patient was more agitated when she gave him a higher dose therefore she went back to giving him 30 mg dose and requested refills for the same.  Today, patient was noted to have slightly brighter affect.  He was calm and pleasant.  His sister stated that it seems like he is doing a little bit better now.  She informed that he is taking a little bit more initiative and taking care of himself.  He is able to take a bath by himself now.  She also informed that he is trying a little bit harder to help around with the chores in the house.  He still spends a lot of time watching TV and sitting in one place but with encouragement he will get up and do other things.  She informed that when he is sitting in one place he starts pulling on his clothes and tearing them.  She informed that he has torn a lot of his shots due to that. She also informed that sometimes he will pull the skin on his lips because he has nothing else to do.  When the writer asked him the reason for doing that he replied that he is mainly bored and that is why he does that. Writer suggested that he tries to walk around the house or maybe  in the backyard at a slow pace so that he can keep himself physically active.  Patient verbalizes understanding and agreement.  He has been sleeping fairly well at night as per sister.    Past Psychiatric History: MDD, recent hospitalization in March due to suicidal ideations.   Past Medical History:  Past Medical History:  Diagnosis Date  . Diabetes mellitus without complication ([place])   . Seizures ([place])   . TBI (traumatic brain injury) ([place]) 03/2017    Past Surgical History:  Procedure Laterality Date  . 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 CRANIOTOMY Right 03/19/2017   Procedure: Cranioplasty with placement of bone flap from abdomen;  Surgeon: Eustace Moore, MD;  Location: [facility];  Service: Neurosurgery;  Laterality: Right;    Family Psychiatric History: denied  Family History:  Family History  Problem Relation Age of Onset  . Diabetes Mother   . Diabetes Father     Social History:   Social History   Socioeconomic History  . Marital status: Single    Spouse name: Not on file  . Number of children: 1  . Years of education: 35  . Highest education level: High school graduate  Occupational History  .  Occupation: not working  Tobacco Use  . Smoking status: Never Smoker  . Smokeless tobacco: Never Used  Vaping Use  . Vaping Use: Never used  Substance and Sexual Activity  . Alcohol use: Yes    Comment: socially, not since accident  . Drug use: No  . Sexual activity: Not on file  Other Topics Concern  . Not on file  Social History Narrative   Lives with sister and her family.  Has one daughter in [place].  Does not work.  Education: high school.    Social Determinants of Health   Financial Resource Strain: Not on file  Food Insecurity: Not on file  Transportation  Needs: Not on file  Physical Activity: Not on file  Stress: Not on file  Social Connections: Not on file    Additional Social History: Lives with sister and  her family  Allergies:   Allergies  Allergen Reactions  . Latex Itching and Rash    Metabolic Disorder Labs: Lab Results  Component Value Date   HGBA1C 5.3 01/23/2021   MPG 252 01/10/2017   Lab Results  Component Value Date   PROLACTIN 2.8 10/01/2020   Lab Results  Component Value Date   CHOL 184 01/23/2021   TRIG 103 01/23/2021   HDL 67 01/23/2021   CHOLHDL 2.7 01/23/2021   VLDL NOT CALC 06/02/2016   LDLCALC 99 01/23/2021   LDLCALC 73 04/20/2019   Lab Results  Component Value Date   TSH 1.27 10/01/2020    Therapeutic Level Labs: No results found for: LITHIUM No results found for: CBMZ No results found for: VALPROATE  Current Medications: Current Outpatient Medications  Medication Sig Dispense Refill  . atorvastatin (LIPITOR) 10 MG tablet TAKE 1 TABLET (10 MG TOTAL) BY MOUTH DAILY. 90 tablet 5  . Blood Glucose Monitoring Suppl (TRUE METRIX METER) w/Device KIT Use as directed 1 kit 0  . cetirizine (ZYRTEC) 10 MG tablet Take 1 tablet (10 mg total) by mouth daily. 30 tablet 11  . Cyanocobalamin (VITAMIN B-12) 500 MCG TABS Take 500 mcg by mouth daily. 100 tablet 1  . FLUoxetine (PROZAC) 10 MG capsule TAKE 3 CAPSULES DAILY (30 MG). 90 capsule 2  . glucose blood test strip Use as instructed 100 each 12  . levETIRAcetam (KEPPRA) 500 MG tablet TAKE 1.5 TABLETS TWICE A DAY 270 tablet 3  . lisinopril (ZESTRIL) 2.5 MG tablet TAKE 1 TABLET (2.5 MG TOTAL) BY MOUTH DAILY. 30 tablet 6  . metFORMIN (GLUCOPHAGE) 1000 MG tablet TAKE 1 TABLET BY MOUTH 2 TIMES DAILY WITH A MEAL. 180 tablet 6  . TRUEPLUS LANCETS 28G MISC Use as directed 100 each 6  . vitamin B-12 (CYANOCOBALAMIN) 500 MCG tablet TAKE 500 MCG BY MOUTH DAILY. 100 tablet 1   No current facility-administered medications for this visit.     Musculoskeletal: Strength & Muscle Tone: within normal limits Gait & Station: unsteady, broad based Patient leans: N/A  Psychiatric Specialty Exam: Review of Systems  Blood pressure 128/70, pulse 86, height 5\' 4"  (1.626 m), weight 132 lb (59.9 kg), SpO2 97 %.Body mass index is 22.66 kg/m.  General Appearance: Fairly Groomed  Eye Contact:  Fair  Speech:  Slow  Volume:  Normal  Mood: Less Depressed  Affect:  Congruent  Thought Process:  Goal Directed and Descriptions of Associations: Intact  Orientation:  Full (Time, Place, and Person)  Thought Content:  Logical  Suicidal Thoughts:  No  Homicidal Thoughts:  No  Memory:  Immediate;  Good Recent;   Fair  Judgement:  Fair  Insight:  Lacking  Psychomotor Activity:  Normal and Wide Base gait  Concentration:  Concentration: Fair and Attention Span: Fair  Recall:  [employer] of Knowledge:Fair  Language: Good  Akathisia:  Negative  Handed:  Right  AIMS (if indicated):  not done  Assets:  Communication Skills Desire for Improvement Financial Resources/Insurance Housing  ADL's:  Intact, needs supervision  Cognition: WNL  Sleep:  Fair   Screenings: GAD-7   [facility] Office Visit from 01/23/2021 in [facility] Office Visit from 05/14/2020 in [facility] Office Visit from 12/13/2018 in [facility] Office Visit from 07/28/2018 in [facility] Office Visit from 06/16/2018 in [facility]  Total GAD-7 Score 0 4 0 2 0    PHQ2-9   [facility] Office Visit from 01/23/2021 in [facility] from 09/17/2020 in [facility] from 06/04/2020 in [facility] Office Visit from 07/14/2019 in [facility] Visit from 12/13/2018 in [facility]  PHQ-2 Total Score 3 0 5 4 0  PHQ-9 Total Score 5 -- 9 11 --      Assessment and Plan: Patient seems to be doing fairly well on his current regimen in terms of his mood.  We will continue the same medication for now.   1. MDD (major depressive disorder), recurrent, in partial remission (HCC)  - FLUoxetine (PROZAC) 10 MG capsule; TAKE 3 CAPSULES DAILY (30 MG).  Dispense: 90 capsule; Refill: 2   Continue same medication regimen. Follow up in 3 months.   [place] Crane, MD 4/21/20223:07 PM

## 2021-02-21 ENCOUNTER — Other Ambulatory Visit: Payer: Self-pay

## 2021-02-26 ENCOUNTER — Ambulatory Visit: Payer: No Typology Code available for payment source

## 2021-03-18 MED FILL — Levetiracetam Tab 500 MG: ORAL | 30 days supply | Qty: 90 | Fill #1 | Status: AC

## 2021-03-18 MED FILL — Atorvastatin Calcium Tab 10 MG (Base Equivalent): ORAL | 30 days supply | Qty: 30 | Fill #1 | Status: AC

## 2021-03-18 MED FILL — Metformin HCl Tab 1000 MG: ORAL | 30 days supply | Qty: 60 | Fill #1 | Status: AC

## 2021-03-18 MED FILL — Cyanocobalamin Tab 500 MCG: ORAL | Qty: 100 | Fill #0 | Status: CN

## 2021-03-19 ENCOUNTER — Other Ambulatory Visit: Payer: Self-pay

## 2021-04-08 ENCOUNTER — Other Ambulatory Visit: Payer: Self-pay

## 2021-04-08 MED FILL — Atorvastatin Calcium Tab 10 MG (Base Equivalent): ORAL | 30 days supply | Qty: 30 | Fill #2 | Status: CN

## 2021-04-08 MED FILL — Levetiracetam Tab 500 MG: ORAL | 30 days supply | Qty: 90 | Fill #2 | Status: CN

## 2021-04-08 MED FILL — Cyanocobalamin Tab 500 MCG: ORAL | Qty: 100 | Fill #0 | Status: CN

## 2021-04-08 MED FILL — Metformin HCl Tab 1000 MG: ORAL | 30 days supply | Qty: 60 | Fill #2 | Status: CN

## 2021-04-09 ENCOUNTER — Other Ambulatory Visit: Payer: Self-pay

## 2021-04-15 ENCOUNTER — Other Ambulatory Visit: Payer: Self-pay

## 2021-04-15 ENCOUNTER — Ambulatory Visit: Payer: Self-pay | Attending: Internal Medicine

## 2021-04-16 ENCOUNTER — Other Ambulatory Visit: Payer: Self-pay

## 2021-04-16 MED FILL — Atorvastatin Calcium Tab 10 MG (Base Equivalent): ORAL | 30 days supply | Qty: 30 | Fill #2 | Status: AC

## 2021-04-16 MED FILL — Metformin HCl Tab 1000 MG: ORAL | 30 days supply | Qty: 60 | Fill #2 | Status: AC

## 2021-04-16 MED FILL — Levetiracetam Tab 500 MG: ORAL | 30 days supply | Qty: 90 | Fill #2 | Status: AC

## 2021-04-30 ENCOUNTER — Ambulatory Visit: Payer: Self-pay | Attending: Internal Medicine

## 2021-04-30 ENCOUNTER — Other Ambulatory Visit: Payer: Self-pay

## 2021-05-15 ENCOUNTER — Encounter (HOSPITAL_COMMUNITY): Payer: Self-pay | Admitting: Psychiatry

## 2021-05-15 ENCOUNTER — Ambulatory Visit (INDEPENDENT_AMBULATORY_CARE_PROVIDER_SITE_OTHER): Payer: No Payment, Other | Admitting: Psychiatry

## 2021-05-15 ENCOUNTER — Other Ambulatory Visit: Payer: Self-pay

## 2021-05-15 DIAGNOSIS — F3341 Major depressive disorder, recurrent, in partial remission: Secondary | ICD-10-CM

## 2021-05-15 MED ORDER — FLUOXETINE HCL 10 MG PO CAPS
ORAL_CAPSULE | ORAL | 2 refills | Status: DC
  Filled 2021-05-15: qty 90, 30d supply, fill #0
  Filled 2021-06-19: qty 90, 30d supply, fill #1
  Filled 2021-07-24: qty 90, 30d supply, fill #2

## 2021-05-15 NOTE — Progress Notes (Addendum)
BH MD/PA/NP OP Progress Note  05/15/2021 4:28 PM [name]  MRN:  [id number]  Chief Complaint: "Things have not been so-so" Per sister "He sits and does not drink all day" Chief Complaint   Medication Management    HPI: 55 -year-old male seen today for follow up psychiatric evaluation.  He is a former patient of Dr. Jean Rosenthal who is being transferred to writer for medication management.  He has a psychiatric history of depression and SI, depression, and TBI.  He is currently managed on Prozac 30 mg daily.  Patient notes his medications are effective in managing his psychiatric conditions.  Today he is well-groomed, pleasant, cooperative, and somewhat engaged in conversation.  His affect is flat and his speech is slow and decreased.  He notes that things has been so-so.  Patient not very talkative throughout the exam and easily distracted at times.  His sister was seen with him.  She notes that she is concerned because he lacks motivation to do anything. Patient notes that he does not playing enjoy soccer or basketball anymore  although he will watch it on TV. She informed Probation officer that he sits on by the TV most days or sleeps during the day.  She notes that since he had an accident 2018 and he has not been mentally the same.  She informed Probation officer that in March 2018 he fell down a step and was left on the step for over 10 hours.  She notes that he was later diagnosed with a brain injury and has not been the same since.  Patient's sister tearful when explaining about her brothers condition.  Provider was empathetic and notes that TBI's can drastically change a person's behavior.  She endorsed understanding and asked provider if there was things that she could do to help her brother.  Provider informed patient and his sister that patience and time are best utilized in these situations.  Provider informed patient that simple puzzles, coloring, or word searches may be effective.  She informed Probation officer  that her brother has not been followed by a neurologist in a while.  Provider asked patient if he has been having seizures or any other behaviors that she did not discussed.  She notes that he has not.  At this time neurology consult was not placed.  No medication changes made today.  Patient agreeable to continue medications as prescribed.  No other concerns noted at this time.   Visit Diagnosis:    ICD-10-CM   1. MDD (major depressive disorder), recurrent, in partial remission (HCC)  F33.41 FLUoxetine (PROZAC) 10 MG capsule      Past Psychiatric History: TBI, MDD, recent hospitalization in March due to suicidal ideations.  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, Placement of bone flap in abdomen;  Surgeon: Ashok Pall, MD;  Location: [facility];  Service: Neurosurgery;  Laterality: Right;   CRANIOTOMY Right 03/19/2017   Procedure: Cranioplasty with placement of bone flap from abdomen;  Surgeon: Eustace Moore, MD;  Location: [facility];  Service: Neurosurgery;  Laterality: Right;    Family Psychiatric History: denied  Family History:  Family History  Problem Relation Age of Onset   Diabetes Mother  Diabetes Father     Social History:  Social History   Socioeconomic History   Marital status: Single    Spouse name: Not on file   Number of children: 1   Years of education: 12   Highest education level: High school graduate  Occupational History   Occupation: not working  Tobacco Use   Smoking status: Never   Smokeless tobacco: Never  Vaping Use   Vaping Use: Never used  Substance and Sexual Activity   Alcohol use: Yes    Comment: socially, not since accident   Drug use: No    Sexual activity: Not on file  Other Topics Concern   Not on file  Social History Narrative   Lives with sister and her family.  Has one daughter in [place].  Does not work.  Education: high school.    Social Determinants of Health   Financial Resource Strain: Not on file  Food Insecurity: Not on file  Transportation Needs: Not on file  Physical Activity: Not on file  Stress: Not on file  Social Connections: Not on file    Allergies:  Allergies  Allergen Reactions   Latex Itching and Rash    Metabolic Disorder Labs: Lab Results  Component Value Date   HGBA1C 5.3 01/23/2021   MPG 252 01/10/2017   Lab Results  Component Value Date   PROLACTIN 2.8 10/01/2020   Lab Results  Component Value Date   CHOL 184 01/23/2021   TRIG 103 01/23/2021   HDL 67 01/23/2021   CHOLHDL 2.7 01/23/2021   VLDL NOT CALC 06/02/2016   LDLCALC 99 01/23/2021   LDLCALC 73 04/20/2019   Lab Results  Component Value Date   TSH 1.27 10/01/2020   TSH 1.170 03/22/2018    Therapeutic Level Labs: No results found for: LITHIUM No results found for: VALPROATE No components found for:  CBMZ  Current Medications: Current Outpatient Medications  Medication Sig Dispense Refill   atorvastatin (LIPITOR) 10 MG tablet TAKE 1 TABLET (10 MG TOTAL) BY MOUTH DAILY. 90 tablet 5   Blood Glucose Monitoring Suppl (TRUE METRIX METER) w/Device KIT Use as directed 1 kit 0   cetirizine (ZYRTEC) 10 MG tablet Take 1 tablet (10 mg total) by mouth daily. 30 tablet 11   Cyanocobalamin (VITAMIN B-12) 500 MCG TABS Take 500 mcg by mouth daily. 100 tablet 1   glucose blood test strip Use as instructed 100 each 12   levETIRAcetam (KEPPRA) 500 MG tablet TAKE 1.5 TABLETS TWICE A DAY 270 tablet 3   lisinopril (ZESTRIL) 2.5 MG tablet TAKE 1 TABLET (2.5 MG TOTAL) BY MOUTH DAILY. 30 tablet 6   metFORMIN (GLUCOPHAGE) 1000 MG tablet TAKE 1 TABLET BY MOUTH 2 TIMES DAILY WITH A MEAL. 180 tablet 6   TRUEPLUS LANCETS 28G MISC Use as  directed 100 each 6   vitamin B-12 (CYANOCOBALAMIN) 500 MCG tablet TAKE 500 MCG BY MOUTH DAILY. 100 tablet 1   FLUoxetine (PROZAC) 10 MG capsule TAKE 3 CAPSULES DAILY (30 MG). 90 capsule 2   No current facility-administered medications for this visit.     Musculoskeletal: Strength & Muscle Tone: within normal limits Gait & Station: normal Patient leans: N/A  Psychiatric Specialty Exam: Review of Systems  Blood pressure 133/80, pulse 77, height $RemoveBe'5\' 4"'lDfyuuENe$  (1.626 m), weight 134 lb (60.8 kg).Body mass index is 23 kg/m.  General Appearance: Well Groomed  Eye Contact:  Good  Speech:  Clear and Coherent and Slow  Volume:  Normal  Mood:  Anxious and Depressed  Affect:  Appropriate and Congruent  Thought Process:  Coherent, Goal Directed, and Linear  Orientation:  Full (Time, Place, and Person)  Thought Content: WDL and Logical   Suicidal Thoughts:  No  Homicidal Thoughts:  No  Memory:  Immediate;   Good Recent;   Good Remote;   Good  Judgement:  Good  Insight:  Good  Psychomotor Activity:  Normal  Concentration:  Concentration: Fair and Attention Span: Fair  Recall:  Good  Fund of Knowledge: Good  Language: Good  Akathisia:  No  Handed:  Right  AIMS (if indicated): not done  Assets:  Communication Skills Desire for Improvement Housing Physical Health Social Support  ADL's:  Intact  Cognition: WNL  Sleep:  Fair   Screenings: GAD-7    Flowsheet Row Clinical Support from 05/15/2021 in [facility] Office Visit from 01/23/2021 in [facility] Office Visit from 05/14/2020 in [facility] Office Visit from 12/13/2018 in [facility] Office Visit from 07/28/2018 in [facility]  Total GAD-7 Score 5 0 4 0 2      PHQ2-9    Flowsheet Row Clinical Support from 05/15/2021 in [facility] Office Visit from 01/23/2021 in  [facility] from 09/17/2020 in [facility] from 06/04/2020 in [facility] Office Visit from 07/14/2019 in [facility]  PHQ-2 Total Score 3 3 0 5 4  PHQ-9 Total Score 12 5 -- 9 11        Assessment and Plan: Patient notes that he is doing well on his medication regimen.  No medication changes made today patient agreeable to continue medication as prescribed.  Patient sister concerned that he is not active mentally or physically.  Provider recommended doing simple puzzles, word searches, and walking.  No other concerns noted at this time.  1. MDD (major depressive disorder), recurrent, in partial remission (HCC)  Continue- FLUoxetine (PROZAC) 10 MG capsule; TAKE 3 CAPSULES DAILY (30 MG).  Dispense: 90 capsule; Refill: 2  Follow-up in 3 months  Salley Slaughter, NP 05/15/2021, 4:28 PM

## 2021-05-20 ENCOUNTER — Other Ambulatory Visit: Payer: Self-pay

## 2021-05-20 ENCOUNTER — Other Ambulatory Visit: Payer: Self-pay | Admitting: Pharmacist

## 2021-05-20 ENCOUNTER — Other Ambulatory Visit: Payer: Self-pay | Admitting: Internal Medicine

## 2021-05-20 DIAGNOSIS — E119 Type 2 diabetes mellitus without complications: Secondary | ICD-10-CM

## 2021-05-20 MED ORDER — METFORMIN HCL 1000 MG PO TABS
1000.0000 mg | ORAL_TABLET | Freq: Two times a day (BID) | ORAL | 0 refills | Status: DC
  Filled 2021-05-20: qty 60, 30d supply, fill #0

## 2021-05-20 MED ORDER — ATORVASTATIN CALCIUM 10 MG PO TABS
10.0000 mg | ORAL_TABLET | Freq: Every day | ORAL | 0 refills | Status: DC
  Filled 2021-05-20: qty 30, 30d supply, fill #0

## 2021-05-20 MED FILL — Levetiracetam Tab 500 MG: ORAL | 30 days supply | Qty: 90 | Fill #3 | Status: AC

## 2021-05-21 ENCOUNTER — Other Ambulatory Visit: Payer: Self-pay

## 2021-05-29 ENCOUNTER — Other Ambulatory Visit: Payer: Self-pay

## 2021-05-29 ENCOUNTER — Encounter: Payer: Self-pay | Admitting: Internal Medicine

## 2021-05-29 ENCOUNTER — Ambulatory Visit: Payer: No Typology Code available for payment source | Attending: Internal Medicine | Admitting: Internal Medicine

## 2021-05-29 VITALS — BP 134/76 | HR 71 | Resp 16 | Wt 134.0 lb

## 2021-05-29 DIAGNOSIS — G40909 Epilepsy, unspecified, not intractable, without status epilepticus: Secondary | ICD-10-CM

## 2021-05-29 DIAGNOSIS — R809 Proteinuria, unspecified: Secondary | ICD-10-CM

## 2021-05-29 DIAGNOSIS — Z1211 Encounter for screening for malignant neoplasm of colon: Secondary | ICD-10-CM

## 2021-05-29 DIAGNOSIS — E1129 Type 2 diabetes mellitus with other diabetic kidney complication: Secondary | ICD-10-CM

## 2021-05-29 DIAGNOSIS — Z87820 Personal history of traumatic brain injury: Secondary | ICD-10-CM

## 2021-05-29 DIAGNOSIS — Z23 Encounter for immunization: Secondary | ICD-10-CM

## 2021-05-29 DIAGNOSIS — I10 Essential (primary) hypertension: Secondary | ICD-10-CM

## 2021-05-29 DIAGNOSIS — D649 Anemia, unspecified: Secondary | ICD-10-CM

## 2021-05-29 NOTE — Progress Notes (Signed)
Patient ID: [name], male    DOB: 1966-05-27  MRN: [id number]  CC: Diabetes   Subjective: [name] is a 55 y.o. male who presents for chronic ds management.  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  Sister reports that he had an unwitnessed fall at home on Saturday.  Not sure how it happen because pt did not tell them Abrasion RT knee noted and family surmised he may had fallen outside.  She had used antibiotic ointment on the abrasion and it is healing well.     DM: BS 120-140. Checking 2x/wk.  Taking metformin and atorvastatin Sister tries to control how much he eats Family takes him out to walk a few times a wk.  Wgh stable Blood pressure noted to be elevated today.  He takes lisinopril.  Sister does not use salt in the foods.  Sister sets up his medications and make sure that he takes them.  Found to have mild anemia on last visit.  Iron studies c/w ACD. B12 level was 322.  Taking B12 500 mcg daily as recommended.  I recommended having colonoscopy for colon cancer screening given the new anemia even though it was not iron deficiency.  SZ/TBI: Sister gets frustrated at time having to care for him.  She has to constantly remind him to do things including taking baths.  Sometimes he eats and forgets he has eaten then eats again.  Sometimes he goes out to walk without telling his family.  Does not go far and does find his way back.  She constantly tells him not to do this. -Family gives him puzzles to do to help stimulate his mind.. -He has not had any seizures since last visit.  He is taking the Keppra.  HM:  Needs Pneumonia vac 20.  Due for COVID booster. Patient Active Problem List   Diagnosis Date  Noted   MDD (major depressive disorder), recurrent, in partial remission ([place]) 11/12/2020   MDD (major depressive disorder), recurrent episode, moderate ([place]) 07/16/2020   Influenza vaccine needed 07/14/2019   Microalbuminuria due to type 2 diabetes mellitus ([place]) 04/11/2019   Hypothyroidism 09/21/2018   Seizure disorder ([place]) 06/16/2018   Low testosterone 06/16/2018   Traumatic brain injury with loss of consciousness ([place]) 10/29/2017   Anemia, chronic disease 10/29/2017   Positive depression screening 10/29/2017   Attention and concentration deficit    Type 2 diabetes mellitus with peripheral neuropathy ([place])    S/P craniotomy 03/19/2017   Essential hypertension    Dysphagia, oropharyngeal 02/01/2017   Diabetes type 2, uncontrolled ([place]) 06/13/2015     Current Outpatient Medications on File Prior to Visit  Medication Sig Dispense Refill   atorvastatin (LIPITOR) 10 MG tablet Take 1 tablet (10 mg total) by mouth daily. 30 tablet 0   Blood Glucose Monitoring Suppl (TRUE METRIX METER) w/Device KIT Use as directed 1 kit 0   cetirizine (ZYRTEC) 10 MG tablet Take 1 tablet (10 mg total) by mouth daily. 30 tablet 11   Cyanocobalamin (VITAMIN B-12) 500 MCG TABS Take 500 mcg by mouth daily. 100 tablet 1   FLUoxetine (PROZAC) 10 MG capsule TAKE 3 CAPSULES DAILY (30 MG). 90 capsule 2   glucose blood test strip Use as instructed  100 each 12   levETIRAcetam (KEPPRA) 500 MG tablet TAKE 1.5 TABLETS TWICE A DAY 270 tablet 3   lisinopril (ZESTRIL) 2.5 MG tablet TAKE 1 TABLET (2.5 MG TOTAL) BY MOUTH DAILY. 30 tablet 6   metFORMIN (GLUCOPHAGE) 1000 MG tablet Take 1 tablet (1,000 mg total) by mouth 2 (two) times daily with a meal. 60 tablet 0   TRUEPLUS LANCETS 28G MISC Use as directed 100 each 6   vitamin B-12 (CYANOCOBALAMIN) 500 MCG tablet TAKE 500 MCG BY MOUTH DAILY. 100 tablet 1   No current facility-administered medications on file prior to visit.    Allergies  Allergen Reactions   Latex  Itching and Rash    Social History   Socioeconomic History   Marital status: Single    Spouse name: Not on file   Number of children: 1   Years of education: 12   Highest education level: High school graduate  Occupational History   Occupation: not working  Tobacco Use   Smoking status: Never   Smokeless tobacco: Never  Vaping Use   Vaping Use: Never used  Substance and Sexual Activity   Alcohol use: Yes    Comment: socially, not since accident   Drug use: No   Sexual activity: Not on file  Other Topics Concern   Not on file  Social History Narrative   Lives with sister and her family.  Has one daughter in [place].  Does not work.  Education: high school.    Social Determinants of Health   Financial Resource Strain: Not on file  Food Insecurity: Not on file  Transportation Needs: Not on file  Physical Activity: Not on file  Stress: Not on file  Social Connections: Not on file  Intimate Partner Violence: Not on file    Family History  Problem Relation Age of Onset   Diabetes Mother    Diabetes Father     Past Surgical History:  Procedure Laterality Date   CRANIOTOMY Right 01/13/2017   Procedure: 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, Placement of bone flap in abdomen;  Surgeon: Ashok Pall, MD;  Location: [facility];  Service: Neurosurgery;  Laterality: Right;   CRANIOTOMY Right 03/19/2017   Procedure: Cranioplasty with placement of bone flap from abdomen;  Surgeon: Eustace Moore, MD;  Location: [facility];  Service: Neurosurgery;  Laterality: Right;    ROS: Review of Systems Negative except as stated above  PHYSICAL EXAM: BP 134/76   Pulse 71   Resp 16   Wt 134 lb (60.8 kg)   SpO2 99%   BMI 23.00 kg/m   Wt Readings from Last 3 Encounters:  05/29/21 134 lb (60.8 kg)  01/23/21 134 lb (60.8 kg)  10/01/20 135 lb (61.2 kg)    Physical Exam  General appearance - alert, well appearing, and in no distress Mental status -patient  answers some questions appropriately.  He follows commands. Neck - supple, no significant adenopathy Chest - clear to auscultation, no wheezes, rales or rhonchi, symmetric air entry Heart - normal rate, regular rhythm, normal S1, S2, no murmurs, rubs, clicks or gallops Extremities - peripheral pulses normal, no pedal edema, no clubbing or cyanosis  CMP Latest Ref Rng & Units 01/23/2021 10/01/2020 01/02/2020  Glucose 70 - 99 mg/dL - 94 92  BUN 6 - 23 mg/dL - 25(H) 17  Creatinine 0.40 - 1.50 mg/dL - 0.74 0.74  Sodium 135 - 145 mEq/L - 134(L) 136  Potassium 3.5 - 5.1 mEq/L - 4.9  3.7  Chloride 96 - 112 mEq/L - 101 103  CO2 19 - 32 mEq/L - 29 25  Calcium 8.4 - 10.5 mg/dL - 8.7 8.7(L)  Total Protein 6.0 - 8.5 g/dL 6.7 - 7.1  Total Bilirubin 0.0 - 1.2 mg/dL 0.3 - 0.6  Alkaline Phos 44 - 121 IU/L 56 - 42  AST 0 - 40 IU/L 19 - 15  ALT 0 - 44 IU/L 21 - 20   Lipid Panel     Component Value Date/Time   CHOL 184 01/23/2021 1634   TRIG 103 01/23/2021 1634   HDL 67 01/23/2021 1634   CHOLHDL 2.7 01/23/2021 1634   CHOLHDL 5.2 (H) 06/02/2016 1232   VLDL NOT CALC 06/02/2016 1232   LDLCALC 99 01/23/2021 1634    CBC    Component Value Date/Time   WBC 6.2 01/23/2021 1634   WBC 5.8 01/02/2020 2249   RBC 3.87 (L) 01/23/2021 1634   RBC 4.23 01/02/2020 2249   HGB 11.7 (L) 01/23/2021 1634   HCT 33.9 (L) 01/23/2021 1634   PLT 226 01/23/2021 1634   MCV 88 01/23/2021 1634   MCH 30.2 01/23/2021 1634   MCH 30.7 01/02/2020 2249   MCHC 34.5 01/23/2021 1634   MCHC 34.0 01/02/2020 2249   RDW 12.5 01/23/2021 1634   LYMPHSABS 1.1 05/10/2018 1849   LYMPHSABS 1.4 04/01/2017 1444   MONOABS 0.3 05/10/2018 1849   EOSABS 0.1 05/10/2018 1849   EOSABS 0.1 04/01/2017 1444   BASOSABS 0.0 05/10/2018 1849   BASOSABS 0.0 04/01/2017 1444   Lab Results  Component Value Date   HGBA1C 5.3 01/23/2021   Depression screen [facility] 2/9 01/23/2021 09/17/2020 06/04/2020  Decreased Interest 3 0 3  Down, Depressed, Hopeless 0  0 2  PHQ - 2 Score 3 0 5  Altered sleeping 0 - 3  Tired, decreased energy 1 - 0  Change in appetite 0 - 0  Feeling bad or failure about yourself  0 - 0  Trouble concentrating 1 - 1  Moving slowly or fidgety/restless 0 - 0  Suicidal thoughts 0 - 0  PHQ-9 Score 5 - 9  Some encounter information is confidential and restricted. Go to Review Flowsheets activity to see all data.  Some recent data might be hidden    ASSESSMENT AND PLAN: 1. Type 2 diabetes mellitus with microalbuminuria, without long-term current use of insulin (HCC) Blood sugars at goal. Commended sister for sticking with him and trying to regulate the amount that he eats so that he does not overeat.  Continue metformin.  2. Essential hypertension Systolic blood pressure intermittently high.  Repeat blood pressure today with better.  Continue lisinopril at current dose which was initially started for microalbuminuria.  3. Normocytic anemia Iron studies more consistent with anemia of chronic disease.  However, I still recommend having a colonoscopy given the anemia is new and he is at the age for colon cancer screening. - Ambulatory referral to Gastroenterology  4. Seizure disorder ([place]) Controlled on Keppra.  5. History of traumatic brain injury -Informed the sister that given the brain injury that he has had in the past including intracranial bleed his current level of functioning will likely not improved beyond what it is now.  Memory is poor and he will continue to need assistance with daily structure.  6. Screening for colon cancer - Ambulatory referral to Gastroenterology  7. Need for vaccination against Streptococcus pneumoniae Given pneumonia vaccine 20 today.   Patient was given the opportunity to ask  questions.  Patient verbalized understanding of the plan and was able to repeat key elements of the plan.   Orders Placed This Encounter  Procedures   Ambulatory referral to Gastroenterology     Requested  Prescriptions    No prescriptions requested or ordered in this encounter    Return in about 4 months (around 09/29/2021).  Karle Plumber, MD, FACP

## 2021-06-09 ENCOUNTER — Encounter: Payer: Self-pay | Admitting: Nurse Practitioner

## 2021-06-19 ENCOUNTER — Other Ambulatory Visit: Payer: Self-pay | Admitting: Internal Medicine

## 2021-06-19 ENCOUNTER — Other Ambulatory Visit: Payer: Self-pay

## 2021-06-19 DIAGNOSIS — E119 Type 2 diabetes mellitus without complications: Secondary | ICD-10-CM

## 2021-06-19 MED ORDER — ATORVASTATIN CALCIUM 10 MG PO TABS
10.0000 mg | ORAL_TABLET | Freq: Every day | ORAL | 2 refills | Status: DC
  Filled 2021-06-19: qty 30, 30d supply, fill #0
  Filled 2021-07-24: qty 30, 30d supply, fill #1
  Filled 2021-08-21: qty 30, 30d supply, fill #2

## 2021-06-19 MED ORDER — METFORMIN HCL 1000 MG PO TABS
1000.0000 mg | ORAL_TABLET | Freq: Two times a day (BID) | ORAL | 2 refills | Status: DC
  Filled 2021-06-19: qty 60, 30d supply, fill #0
  Filled 2021-07-24: qty 60, 30d supply, fill #1
  Filled 2021-08-21: qty 60, 30d supply, fill #2

## 2021-06-19 MED FILL — Levetiracetam Tab 500 MG: ORAL | 30 days supply | Qty: 90 | Fill #4 | Status: AC

## 2021-06-19 NOTE — Telephone Encounter (Signed)
Requested medication (s) are due for refill today Yes  Requested medication (s) are on the active medication list Yes  Future visit scheduled Yes for 10/02/21.    Note to clinic-Last ordered on 05/20/21 with expiration of 06/19/21. Routing to clinic for new order.   Requested Prescriptions  Pending Prescriptions Disp Refills   metFORMIN (GLUCOPHAGE) 1000 MG tablet 60 tablet 0    Sig: Take 1 tablet (1,000 mg total) by mouth 2 (two) times daily with a meal.     Endocrinology:  Diabetes - Biguanides Passed - 06/19/2021 10:13 AM      Passed - Cr in normal range and within 360 days    Creat  Date Value Ref Range Status  06/02/2016 0.71 0.70 - 1.33 mg/dL Final    Comment:      For patients > or = 56 years of age: The upper reference limit for Creatinine is approximately 13% higher for people identified as African-American.      Creatinine, Ser  Date Value Ref Range Status  10/01/2020 0.74 0.40 - 1.50 mg/dL Final   Creatinine, Urine  Date Value Ref Range Status  06/02/2016 67 20 - 370 mg/dL Final          Passed - HBA1C is between 0 and 7.9 and within 180 days    Hgb A1c MFr Bld  Date Value Ref Range Status  01/23/2021 5.3 4.8 - 5.6 % Final    Comment:             Prediabetes: 5.7 - 6.4          Diabetes: >6.4          Glycemic control for adults with diabetes: <7.0           Passed - eGFR in normal range and within 360 days    GFR calc Af Amer  Date Value Ref Range Status  01/02/2020 >60 >60 mL/min Final   GFR calc non Af Amer  Date Value Ref Range Status  01/02/2020 >60 >60 mL/min Final   GFR  Date Value Ref Range Status  10/01/2020 102.27 >60.00 mL/min Final    Comment:    Calculated using the CKD-EPI Creatinine Equation (2021)          Passed - Valid encounter within last 6 months    Recent Outpatient Visits           3 weeks ago Type 2 diabetes mellitus with microalbuminuria, without long-term current use of insulin (HCC)   [facility] Johnson, Deborah B, MD   4 months ago Type 2 diabetes mellitus with microalbuminuria, without long-term current use of insulin (HCC)   [facility] Johnson, Deborah B, MD   8 months ago Need for influenza vaccination   [facility] [place], Stephen L, RPH-CPP   9 months ago Controlled type 2 diabetes mellitus without complication, without long-term current use of insulin (HCC)   [facility] Johnson, Deborah B, MD   1 year ago Controlled type 2 diabetes mellitus without complication, without long-term current use of insulin (HCC)   [facility] Johnson, Deborah B, MD       Future Appointments             In 3 months Johnson, Deborah B, MD [facility]               atorvastatin (LIPITOR) 10 MG tablet 30 tablet 0    Sig: Take 1 tablet (10 mg total) by mouth daily.     Cardiovascular:  Antilipid - Statins Passed - 06/19/2021 10:13 AM      Passed - Total Cholesterol in normal range and within 360 days    Cholesterol, Total  Date Value Ref Range Status  01/23/2021 184 100 - 199 mg/dL Final          Passed - LDL in normal range and within 360 days    LDL Chol Calc (NIH)  Date Value Ref Range Status  01/23/2021 99 0 - 99 mg/dL Final          Passed - HDL in normal range and within 360 days    HDL  Date Value Ref Range Status  01/23/2021 67 >39 mg/dL Final          Passed - Triglycerides in normal range and within 360 days    Triglycerides  Date Value Ref Range Status  01/23/2021 103 0 - 149 mg/dL Final          Passed - Patient is not pregnant      Passed - Valid encounter within last 12 months    Recent Outpatient Visits           3 weeks ago Type 2 diabetes mellitus with microalbuminuria, without long-term current use of insulin ([place])   [place], Neoma Laming  B, MD   4 months ago Type 2 diabetes mellitus with microalbuminuria, without long-term current use of insulin ([place])   [place] Karle Plumber B, MD   8 months ago Need for influenza vaccination   [place], Annie Main L, RPH-CPP   9 months ago Controlled type 2 diabetes mellitus without complication, without long-term current use of insulin [facility])   [place], Deborah B, MD   1 year ago Controlled type 2 diabetes mellitus without complication, without long-term current use of insulin [facility])   [place], MD       Future Appointments             In 3 months Wynetta Emery, Dalbert Batman, MD [place]

## 2021-07-03 ENCOUNTER — Encounter: Payer: Self-pay | Admitting: Internal Medicine

## 2021-07-04 ENCOUNTER — Other Ambulatory Visit: Payer: Self-pay

## 2021-07-04 ENCOUNTER — Other Ambulatory Visit: Payer: Self-pay | Admitting: Pharmacist

## 2021-07-04 DIAGNOSIS — E119 Type 2 diabetes mellitus without complications: Secondary | ICD-10-CM

## 2021-07-04 MED ORDER — GLUCOSE BLOOD VI STRP
ORAL_STRIP | 2 refills | Status: DC
  Filled 2021-07-04 – 2021-07-24 (×2): qty 100, 90d supply, fill #0
  Filled 2021-07-25: qty 100, 25d supply, fill #0

## 2021-07-08 ENCOUNTER — Other Ambulatory Visit: Payer: Self-pay

## 2021-07-15 ENCOUNTER — Other Ambulatory Visit: Payer: Self-pay

## 2021-07-17 ENCOUNTER — Ambulatory Visit (INDEPENDENT_AMBULATORY_CARE_PROVIDER_SITE_OTHER): Payer: Self-pay | Admitting: Nurse Practitioner

## 2021-07-17 ENCOUNTER — Encounter: Payer: Self-pay | Admitting: Nurse Practitioner

## 2021-07-17 ENCOUNTER — Other Ambulatory Visit: Payer: Self-pay

## 2021-07-17 ENCOUNTER — Other Ambulatory Visit (INDEPENDENT_AMBULATORY_CARE_PROVIDER_SITE_OTHER): Payer: No Typology Code available for payment source

## 2021-07-17 VITALS — BP 142/66 | HR 64 | Ht 63.0 in | Wt 138.0 lb

## 2021-07-17 DIAGNOSIS — Z87820 Personal history of traumatic brain injury: Secondary | ICD-10-CM

## 2021-07-17 DIAGNOSIS — Z1211 Encounter for screening for malignant neoplasm of colon: Secondary | ICD-10-CM

## 2021-07-17 DIAGNOSIS — R1312 Dysphagia, oropharyngeal phase: Secondary | ICD-10-CM

## 2021-07-17 DIAGNOSIS — D638 Anemia in other chronic diseases classified elsewhere: Secondary | ICD-10-CM

## 2021-07-17 LAB — CBC
HCT: 34.5 % — ABNORMAL LOW (ref 39.0–52.0)
Hemoglobin: 12 g/dL — ABNORMAL LOW (ref 13.0–17.0)
MCHC: 34.7 g/dL (ref 30.0–36.0)
MCV: 88.9 fl (ref 78.0–100.0)
Platelets: 216 10*3/uL (ref 150.0–400.0)
RBC: 3.88 Mil/uL — ABNORMAL LOW (ref 4.22–5.81)
RDW: 13 % (ref 11.5–15.5)
WBC: 4.2 10*3/uL (ref 4.0–10.5)

## 2021-07-17 LAB — IBC + FERRITIN
Ferritin: 41.6 ng/mL (ref 22.0–322.0)
Iron: 92 ug/dL (ref 42–165)
Saturation Ratios: 26.8 % (ref 20.0–50.0)
TIBC: 343 ug/dL (ref 250.0–450.0)
Transferrin: 245 mg/dL (ref 212.0–360.0)

## 2021-07-17 LAB — VITAMIN B12: Vitamin B-12: 496 pg/mL (ref 211–911)

## 2021-07-17 MED ORDER — SUPREP BOWEL PREP KIT 17.5-3.13-1.6 GM/177ML PO SOLN
1.0000 | ORAL | 0 refills | Status: DC
  Filled 2021-07-17 – 2021-07-24 (×2): qty 354, 1d supply, fill #0

## 2021-07-17 NOTE — Progress Notes (Signed)
07/17/2021 Benjimin Hadden [id number] 24-Feb-1966   CHIEF COMPLAINT:  Anemia   HISTORY OF PRESENT ILLNESS: [name] is a 55 year old male with a past medical history of hypertension, hyperlipidemia, DM II, traumatic fall resulting in TBI with a subdural hematoma s/p craniotomy 2018 and seizures. Last seizure was 06/2018 remains on Keppra.  He presents to our office today as referred by Dr. Karle Plumber to schedule a screening colonoscopy in the setting of having chronic anemia.  Laboratory studies done 12/2020 showed a normal iron panel with low normal vitamin B12 level of 322.  He was prescribed vitamin B12 supplement.  He is accompanied by his sister [name]/POA and a [place] Spanish interpreter who is present to facilitate communication.  The patient speaks Spanish and his sister speaks [place] fluently. His sister provides his history.  He denies having any heartburn or upper abdominal pain.  When asked directly if he has difficulty swallowing he stated no, however, his sister stated he demonstrates evidence of dysphagia.  She reported Maurizio sometimes coughs when eating, he coughs at the food he just swallowed which occurs approximately twice weekly for the past 2 months.  He sometimes puts too much food in his mouth when eating.  No NSAID use.  No alcohol use.  He is a non-smoker.  He reports passing a normal formed stool daily.  No rectal bleeding or black-colored stools.  He goes to the bathroom independently therefore his sister is unaware of what type of stool he passes.  No obvious weight loss.  No known family history of esophageal, gastric or colon cancer.  He is on Keppra after he developed seizures following his TBI and craniotomy surgery in 2018.  His sister verified his last seizure activity was 06/2018.  He has some short-term memory deficit and difficulty concentrating as reported by his sister.  He is allergic to latex.  Past Medical History:  Diagnosis  Date   Diabetes mellitus without complication ([place])    Seizures ([place])    TBI (traumatic brain injury) ([place]) 03/2017   Past Surgical History:  Procedure Laterality Date   CRANIOTOMY Right 01/13/2017   Procedure: 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, Placement of bone flap in abdomen;  Surgeon: Ashok Pall, MD;  Location: [facility];  Service: Neurosurgery;  Laterality: Right;   CRANIOTOMY Right 03/19/2017   Procedure: Cranioplasty with placement of bone flap from abdomen;  Surgeon: Eustace Moore, MD;  Location: [facility];  Service: Neurosurgery;  Laterality: Right;   Social History: He is single.  He lives with his sister.  Non-smoker.  No alcohol use.  No drug use.  Family History: Mother and father with history of diabetes.  No known family history of esophageal, gastric or colon cancer.   Allergies  Allergen Reactions   Latex Itching and Rash      Outpatient Encounter Medications as of 07/17/2021  Medication Sig   atorvastatin (LIPITOR) 10 MG tablet Take 1 tablet (10 mg total) by mouth daily.   Blood Glucose Monitoring Suppl (TRUE METRIX METER) w/Device KIT Use as directed   cetirizine (ZYRTEC) 10 MG tablet Take 1 tablet (10 mg total) by mouth daily.   FLUoxetine (PROZAC) 10 MG capsule TAKE 3 CAPSULES DAILY (30 MG).   glucose blood test strip Use as instructed to check blood sugar once daily.   levETIRAcetam (KEPPRA) 500 MG tablet TAKE 1.5 TABLETS TWICE A DAY   lisinopril (ZESTRIL) 2.5 MG tablet TAKE  1 TABLET (2.5 MG TOTAL) BY MOUTH DAILY.   metFORMIN (GLUCOPHAGE) 1000 MG tablet Take 1 tablet (1,000 mg total) by mouth 2 (two) times daily with a meal.   TRUEPLUS LANCETS 28G MISC Use as directed   vitamin B-12 (CYANOCOBALAMIN) 500 MCG tablet TAKE 500 MCG BY MOUTH DAILY.   [DISCONTINUED] Cyanocobalamin (VITAMIN B-12) 500 MCG TABS Take 500 mcg by mouth daily.   No facility-administered encounter medications on file as of 07/17/2021.    REVIEW OF SYSTEMS:   Gen: Denies fever, sweats or chills. No weight loss.  CV: Denies chest pain, palpitations or edema. Resp: Denies cough, shortness of breath of hemoptysis.  GI: See HPI. GU : Denies urinary burning, blood in urine, increased urinary frequency or incontinence. MS: Denies joint pain, muscles aches or weakness. Derm: Denies rash, itchiness, skin lesions or unhealing ulcers. Psych: + Depression. + Memory issues.  Heme: + Sometimes bruises easily.  Neuro:  Denies headaches, dizziness or paresthesias. Endo: + DM.  PHYSICAL EXAM: BP (!) 142/66   Pulse 64   Ht $R'5\' 3"'eg$  (1.6 m)   Wt 138 lb (62.6 kg)   BMI 24.45 kg/m  General: Well developed 55 year old male in no acute distress. Head: Normocephalic and atraumatic. Eyes:  Sclerae non-icteric, conjunctive pink. Ears: Normal auditory acuity. Mouth: Poor dentition.  No ulcers or lesions.  Neck: Supple, no lymphadenopathy or thyromegaly.  Lungs: Clear bilaterally to auscultation without wheezes, crackles or rhonchi. Heart: Regular rate and rhythm. No murmur, rub or gallop appreciated.  Abdomen: Soft, nontender, non distended. No masses. No hepatosplenomegaly. Normoactive bowel sounds x 4 quadrants.  Rectal: Deferred. Musculoskeletal: Symmetrical with no gross deformities. Skin: Warm and dry. No rash or lesions on visible extremities. Extremities: No edema. Neurological: Alert oriented x 4, no focal deficits.  Psychological:  Alert and cooperative. Normal mood and affect.  ASSESSMENT AND PLAN:  82) 55 year old male presents for colon cancer screening -Colonoscopy at [facility] benefits and risks discussed including risk with sedation, risk of bleeding, perforation and infection discussed with the patient and sister/POA  2) Dysphagia -EGD at [facility] benefits and risks discussed including risk with sedation, risk of bleeding, perforation and infection discussed with the patient and sister/POA  3) Chronic normocytic anemia. No overt GI bleeding. Labs done  12/2020 showed a hemoglobin level of 11.7.  Hematocrit 33.9.  MCV 88.  Normal iron panel.  B12 322 on oral B12 supplement -Repeat CBC, iron panel and B12 level as last labs were done 6 months ago -EGD to also assess for atrophic gastritis/UGI malignancy   4) TBI s/p craniotomy with seizure disorder on Keppra.  Last seizure activity was 06/2018.  Further recommendations to be determined after the above evaluation completed       CC:  Ladell Pier, MD

## 2021-07-17 NOTE — Patient Instructions (Signed)
LABS:  Lab work has been ordered for you today. Our lab is located in the basement. Press "B" on the elevator. The lab is located at the first door on the left as you exit the elevator.  HEALTHCARE LAWS AND MY CHART RESULTS: Due to recent changes in healthcare laws, you may see the results of your imaging and laboratory studies on MyChart before your provider has had a chance to review them.   We understand that in some cases there may be results that are confusing or concerning to you. Not all laboratory results come back in the same time frame and the provider may be waiting for multiple results in order to interpret others.  Please give [place] 48 hours in order for your provider to thoroughly review all the results before contacting the office for clarification of your results.   PROCEDURES: You have been scheduled for an EGD and Colonoscopy. Please follow the written instructions given to you at your visit today. Please pick up your prep supplies at the pharmacy within the next 1-3 days. If you use inhalers (even only as needed), please bring them with you on the day of your procedure.  It was great seeing you today! Thank you for entrusting me with your care and choosing [facility].  Arnaldo Natal, CRNP  The [place] [facility] providers would like to encourage you to use [facility] to communicate with providers for non-urgent requests or questions.  Due to long hold times on the telephone, sending your provider a message by [facility] may be faster and more efficient way to get a response. Please allow 48 business hours for a response.  Please remember that this is for non-urgent requests/questions.  If you are age 56 or older, your body mass index should be between 23-30. Your Body mass index is 24.45 kg/m. If this is out of the aforementioned range listed, please consider follow up with your Primary Care Provider.  If you are age 56 or younger, your body mass index should be between  19-25. Your Body mass index is 24.45 kg/m. If this is out of the aformentioned range listed, please consider follow up with your Primary Care Provider.   LABORATORIOS: El trabajo de laboratorio ha sido ordenado para usted hoy. Nuestro laboratorio est [employer] stano. Presione "B" en el ascensor. El laboratorio est ubicado en la primera puerta a la izquierda al salir del Medical sales representative.  LAS LEYES DE ATENCIN MDICA Y LOS RESULTADOS DE MY CHART: debido a cambios recientes en las leyes de atencin mdica, es posible que vea los resultados de sus estudios de imgenes y de laboratorio en MyChart antes de que su proveedor haya tenido la oportunidad de revisarlos. Entendemos que en algunos casos puede haber resultados confusos o preocupantes para usted. No todos los resultados de laboratorio regresan en el mismo perodo de tiempo y el proveedor puede estar esperando mltiples resultados para Administrator, Civil Service. Denos 48 horas para que su proveedor revise minuciosamente todos los resultados antes de comunicarse con la oficina para Engineer, structural.  PROCEDIMIENTOS: Se le ha programado una EGD y Neomia Dear colonoscopia. Siga las instrucciones escritas que se le dieron en su visita de hoy. Recoja sus suministros de preparacin en la farmacia dentro de los prximos 1 a 3 das. Si [place] inhaladores (aunque solo sea necesario), trigalos el da de su procedimiento.  Fue genial verte hoy! Gracias por confiarme su atencin y elegir [facility].  Arnaldo Natal, CRNP  Los proveedores  de [place] [facility] desean alentarlo a que use MYCHART para comunicarse con los proveedores para solicitudes o preguntas que no sean urgentes. Debido a los Retail buyer de espera en el telfono, enviar un mensaje a su proveedor por [employer] puede ser una forma ms rpida y eficiente de obtener una respuesta. Espere 48 horas hbiles para obtener [employer]. Recuerde que esto es para solicitudes/preguntas no  urgentes.  Si tiene 56 aos o ms, su ndice de [employer] corporal debe estar entre 23 y 30. Su ndice de masa corporal es de 24,45 kg/m. Si esto est fuera del rango mencionado anteriormente, considere hacer un seguimiento con su proveedor de Marine scientist.  Si tiene 56 aos o menos, su ndice de [employer] corporal debe estar entre 19 y 84. Su ndice de masa corporal es de 24,45 kg/m. Si esto est fuera del rango mencionado anteriormente, considere hacer un seguimiento con su proveedor de Marine scientist.

## 2021-07-24 ENCOUNTER — Other Ambulatory Visit: Payer: Self-pay

## 2021-07-24 MED FILL — Levetiracetam Tab 500 MG: ORAL | 30 days supply | Qty: 90 | Fill #5 | Status: AC

## 2021-07-25 ENCOUNTER — Other Ambulatory Visit: Payer: Self-pay

## 2021-07-28 ENCOUNTER — Other Ambulatory Visit: Payer: Self-pay

## 2021-07-30 NOTE — Progress Notes (Signed)
Reviewed and agree with documentation and assessment and plan. K. Veena Izen Petz , MD   

## 2021-08-04 ENCOUNTER — Other Ambulatory Visit: Payer: Self-pay

## 2021-08-11 ENCOUNTER — Ambulatory Visit: Payer: No Typology Code available for payment source | Admitting: Gastroenterology

## 2021-08-11 ENCOUNTER — Encounter: Payer: Self-pay | Admitting: Gastroenterology

## 2021-08-11 ENCOUNTER — Other Ambulatory Visit: Payer: Self-pay

## 2021-08-11 VITALS — BP 119/71 | HR 63 | Temp 98.7°F | Resp 16 | Ht 63.0 in | Wt 138.0 lb

## 2021-08-11 DIAGNOSIS — Z1211 Encounter for screening for malignant neoplasm of colon: Secondary | ICD-10-CM

## 2021-08-11 DIAGNOSIS — R1312 Dysphagia, oropharyngeal phase: Secondary | ICD-10-CM

## 2021-08-11 MED ORDER — FLEET ENEMA 7-19 GM/118ML RE ENEM
1.0000 | ENEMA | Freq: Once | RECTAL | Status: AC
  Administered 2021-08-11: 1 via RECTAL

## 2021-08-11 MED ORDER — SODIUM CHLORIDE 0.9 % IV SOLN: 500.0000 mL | Freq: Once | INTRAVENOUS | Status: DC

## 2021-08-11 NOTE — Progress Notes (Addendum)
Upon attempting for pt to stand up from commode after receiving enema pt was clammy and lightheaded.  I directed pt to have a seat back on commode.  Notified for assistance wheelchair was provided and CRNA Gilmore Laroche and MD Marsa Aris at bedside.  Vitals stable.

## 2021-08-11 NOTE — Progress Notes (Addendum)
Patient was unable to get up from the toilet seat after he was given enema in the preop area, according to his sister it seemed like an episode of absence  seizure that he had in the past. Vitals are stable He had traumatic brain injury s/p craniectomy in 2018 with history of seizure disorder. She given his seizure medications today, he has not had any recent seizures prior to this episode. Advised follow up with his Neurologist   According to his sister many times it is hard for her to get an accurate answer from the patient on how he is feeling so its very difficult for her to determine on his status.  Discussed alternative methods cologaurd for colorectal cancer screening, instead of colonoscopy as it is extremely difficult for patient to do bowel prep He is average risk, no rectal bleeding or iron deficiency Patient sister agreed to proceed with cologaurd and we will send in the request for it.  He has difficulty swallowing, she feels he chokes on his food at times.  We will need to exclude erosive esophagitis, peptic stricture or neoplasia.  There may be an additional component of oropharyngeal dysphagia. Will obtain modified barium swallow and barium esophagram Plan for EGD for further evaluation of dysphagia and esophageal dilation if needed, to be scheduled at [facility] endoscopy unit at next available appointment  Will cancel the EGD and colonoscopy that was scheduled for today, patient is not appropriate for planned procedure(s) and anesthesia in an ambulatory setting Patient is at risk for intraprocedure or postop seizures.  Iona Beard , MD [phone]

## 2021-08-12 ENCOUNTER — Telehealth: Payer: Self-pay

## 2021-08-12 ENCOUNTER — Telehealth (HOSPITAL_COMMUNITY): Payer: Self-pay

## 2021-08-12 ENCOUNTER — Other Ambulatory Visit: Payer: Self-pay

## 2021-08-12 DIAGNOSIS — R1312 Dysphagia, oropharyngeal phase: Secondary | ICD-10-CM

## 2021-08-12 DIAGNOSIS — Z1211 Encounter for screening for malignant neoplasm of colon: Secondary | ICD-10-CM

## 2021-08-12 NOTE — Telephone Encounter (Signed)
See phone note

## 2021-08-12 NOTE — Progress Notes (Signed)
Updated progress note reviewed

## 2021-08-12 NOTE — Telephone Encounter (Signed)
-----   Message from Napoleon Form, MD sent at 08/11/2021  4:22 PM EDT ----- Thank you [name] for helping, please let me know if you still have availability to do the EGD next week.  [name]/[name],  can you please order Cologaurd for colon cancer screening.  He will also need modified barium swallow and barium esophagogram.  Thanks VN

## 2021-08-12 NOTE — Telephone Encounter (Signed)
Attempted to contact sister of patient to schedule OP MBS - left voicemail

## 2021-08-12 NOTE — Telephone Encounter (Signed)
-----   Message from Steven P Armbruster, MD sent at 08/12/2021  7:48 AM EDT ----- Thanks Veena,  Sahith Nurse can you help coordinate this EGD at the hospital next Tuesday AM, 10/18, at 730? I had a cancellation at the hospital and should have an opening then. Thank you  Steve  ----- Message ----- From: Nandigam, Kavitha V, MD Sent: 08/11/2021   4:25 PM EDT To: Elizabeth A McKew, LPN, #  Thank you Steve for helping, please let me know if you still have availability to do the EGD next week.  Beth/Willet Schleifer,  can you please order Cologaurd for colon cancer screening.  He will also need modified barium swallow and barium esophagogram.  Thanks VN   

## 2021-08-12 NOTE — Telephone Encounter (Signed)
-----   Message from Benancio Deeds, MD sent at 08/12/2021  7:48 AM EDT ----- Thanks Roxine Caddy can you help coordinate this EGD at the hospital next Tuesday AM, 10/18, at 730? I had a cancellation at the hospital and should have an opening then. Thank you  Brett Canales  ----- Message ----- From: Napoleon Form, MD Sent: 08/11/2021   4:25 PM EDT To: Evalee Jefferson, LPN, #  Thank you Brett Canales for helping, please let me know if you still have availability to do the EGD next week.  Beth/Brooklyn,  can you please order Cologaurd for colon cancer screening.  He will also need modified barium swallow and barium esophagogram.  Thanks VN

## 2021-08-12 NOTE — Telephone Encounter (Signed)
Patient has been scheduled for an EGD at [facility] with Dr. Adela Lank on Tuesday, 08/19/21 at 7:30 am. Pt will need to arrive at 6 am. CASE ID: [id number]  Orders in epic for cologuard, barium swallow study and modified barium swallow study. Secure staff message sent to radiology schedulers to contact patient's sister directly to set up appt.   I spoke with patient's sister in regards to recommendations and appt information. Pt's sister states that she has access to patient's my chart, advised that she can find his instructions there under "Letter" tab. Pt's sister verbalized understanding of all information and had no concerns at the end of the call.  I spoke with Dahlia Client in speech pathology, she has received the order for modified barium study. She will contact patient' sister to get appts coordinated.

## 2021-08-13 ENCOUNTER — Encounter (HOSPITAL_COMMUNITY): Payer: Self-pay | Admitting: Gastroenterology

## 2021-08-13 NOTE — Progress Notes (Signed)
Attempted to obtain medical history via telephone, unable to reach at this time. I left a voicemail to return pre surgical testing department's phone call.  

## 2021-08-14 ENCOUNTER — Telehealth (HOSPITAL_COMMUNITY): Payer: No Payment, Other | Admitting: Psychiatry

## 2021-08-14 ENCOUNTER — Other Ambulatory Visit (HOSPITAL_COMMUNITY): Payer: Self-pay | Admitting: Psychiatry

## 2021-08-14 ENCOUNTER — Other Ambulatory Visit (HOSPITAL_COMMUNITY): Payer: Self-pay | Admitting: *Deleted

## 2021-08-14 ENCOUNTER — Telehealth (HOSPITAL_COMMUNITY): Payer: Self-pay | Admitting: Psychiatry

## 2021-08-14 ENCOUNTER — Other Ambulatory Visit: Payer: Self-pay

## 2021-08-14 DIAGNOSIS — F3341 Major depressive disorder, recurrent, in partial remission: Secondary | ICD-10-CM

## 2021-08-14 DIAGNOSIS — R1310 Dysphagia, unspecified: Secondary | ICD-10-CM

## 2021-08-14 MED ORDER — FLUOXETINE HCL 10 MG PO CAPS
ORAL_CAPSULE | ORAL | 2 refills | Status: DC
Start: 2021-08-14 — End: 2021-10-21
  Filled 2021-08-14: qty 90, fill #0
  Filled 2021-08-21: qty 90, 30d supply, fill #0
  Filled 2021-09-23: qty 90, 30d supply, fill #1

## 2021-08-14 NOTE — Telephone Encounter (Signed)
Medication refilled and sent to preferred pharmacy

## 2021-08-18 NOTE — Anesthesia Preprocedure Evaluation (Addendum)
Anesthesia Evaluation    Reviewed: Allergy & Precautions, H&P , Patient's Chart, lab work & pertinent test results  Airway Mallampati: III  TM Distance: >3 FB Neck ROM: Full    Dental  (+) Teeth Intact, Dental Advisory Given   Pulmonary neg pulmonary ROS,    breath sounds clear to auscultation       Cardiovascular Exercise Tolerance: Good hypertension, Pt. on medications negative cardio ROS   Rhythm:Regular Rate:Normal     Neuro/Psych Seizures -, Well Controlled,  Depression negative neurological ROS  negative psych ROS   GI/Hepatic negative GI ROS, Neg liver ROS,   Endo/Other  negative endocrine ROSdiabetes, Type 2, Oral Hypoglycemic AgentsHypothyroidism   Renal/GU negative Renal ROS  negative genitourinary   Musculoskeletal   Abdominal   Peds  Hematology negative hematology ROS (+) Blood dyscrasia, anemia ,   Anesthesia Other Findings   Reproductive/Obstetrics negative OB ROS                            Anesthesia Physical Anesthesia Plan  ASA: 2  Anesthesia Plan: MAC   Post-op Pain Management:    Induction: Intravenous  PONV Risk Score and Plan: 1 and Propofol infusion  Airway Management Planned: Simple Face Mask  Additional Equipment:   Intra-op Plan:   Post-operative Plan:   Informed Consent: I have reviewed the patients History and Physical, chart, labs and discussed the procedure including the risks, benefits and alternatives for the proposed anesthesia with the patient or authorized representative who has indicated his/her understanding and acceptance.     Dental advisory given  Plan Discussed with: CRNA  Anesthesia Plan Comments:        Anesthesia Quick Evaluation

## 2021-08-19 ENCOUNTER — Ambulatory Visit (HOSPITAL_COMMUNITY): Payer: No Typology Code available for payment source | Admitting: Certified Registered Nurse Anesthetist

## 2021-08-19 ENCOUNTER — Ambulatory Visit (HOSPITAL_COMMUNITY)
Admission: RE | Admit: 2021-08-19 | Discharge: 2021-08-19 | Disposition: A | Discharge status: Home / Self Care | Payer: No Typology Code available for payment source | Attending: Gastroenterology | Admitting: Gastroenterology

## 2021-08-19 ENCOUNTER — Encounter (HOSPITAL_COMMUNITY)
Admission: RE | Disposition: A | Discharge status: Home / Self Care | Payer: Self-pay | Source: Home / Self Care | Attending: Gastroenterology

## 2021-08-19 ENCOUNTER — Other Ambulatory Visit: Payer: Self-pay

## 2021-08-19 ENCOUNTER — Encounter (HOSPITAL_COMMUNITY): Payer: Self-pay | Admitting: Gastroenterology

## 2021-08-19 DIAGNOSIS — K2990 Gastroduodenitis, unspecified, without bleeding: Secondary | ICD-10-CM

## 2021-08-19 DIAGNOSIS — K317 Polyp of stomach and duodenum: Secondary | ICD-10-CM

## 2021-08-19 DIAGNOSIS — R1312 Dysphagia, oropharyngeal phase: Secondary | ICD-10-CM

## 2021-08-19 DIAGNOSIS — R1310 Dysphagia, unspecified: Secondary | ICD-10-CM | POA: Insufficient documentation

## 2021-08-19 DIAGNOSIS — K449 Diaphragmatic hernia without obstruction or gangrene: Secondary | ICD-10-CM | POA: Insufficient documentation

## 2021-08-19 DIAGNOSIS — G40909 Epilepsy, unspecified, not intractable, without status epilepticus: Secondary | ICD-10-CM | POA: Insufficient documentation

## 2021-08-19 DIAGNOSIS — K2289 Other specified disease of esophagus: Secondary | ICD-10-CM | POA: Insufficient documentation

## 2021-08-19 DIAGNOSIS — K2970 Gastritis, unspecified, without bleeding: Secondary | ICD-10-CM

## 2021-08-19 DIAGNOSIS — Z87820 Personal history of traumatic brain injury: Secondary | ICD-10-CM | POA: Insufficient documentation

## 2021-08-19 DIAGNOSIS — B9681 Helicobacter pylori [H. pylori] as the cause of diseases classified elsewhere: Secondary | ICD-10-CM | POA: Insufficient documentation

## 2021-08-19 HISTORY — PX: POLYPECTOMY: SHX5525

## 2021-08-19 HISTORY — PX: ESOPHAGOGASTRODUODENOSCOPY (EGD) WITH PROPOFOL: SHX5813

## 2021-08-19 HISTORY — PX: SAVORY DILATION: SHX5439

## 2021-08-19 HISTORY — PX: BIOPSY: SHX5522

## 2021-08-19 LAB — GLUCOSE, CAPILLARY: Glucose-Capillary: 95 mg/dL (ref 70–99)

## 2021-08-19 SURGERY — ESOPHAGOGASTRODUODENOSCOPY (EGD) WITH PROPOFOL
Anesthesia: Monitor Anesthesia Care

## 2021-08-19 MED ORDER — PROPOFOL 500 MG/50ML IV EMUL
INTRAVENOUS | Status: AC
  Filled 2021-08-19: qty 50

## 2021-08-19 MED ORDER — LACTATED RINGERS IV SOLN: INTRAVENOUS | Status: DC

## 2021-08-19 MED ORDER — PROPOFOL 10 MG/ML IV BOLUS
INTRAVENOUS | Status: DC | PRN
  Administered 2021-08-19: 50 mg via INTRAVENOUS
  Administered 2021-08-19: 25 mg via INTRAVENOUS

## 2021-08-19 MED ORDER — GLYCOPYRROLATE PF 0.2 MG/ML IJ SOSY
PREFILLED_SYRINGE | INTRAMUSCULAR | Status: DC | PRN
  Administered 2021-08-19: .2 mg via INTRAVENOUS

## 2021-08-19 MED ORDER — PROPOFOL 500 MG/50ML IV EMUL
INTRAVENOUS | Status: AC
  Filled 2021-08-19: qty 100

## 2021-08-19 MED ORDER — PHENYLEPHRINE HCL (PRESSORS) 10 MG/ML IV SOLN
INTRAVENOUS | Status: AC
  Filled 2021-08-19: qty 2

## 2021-08-19 MED ORDER — SODIUM CHLORIDE 0.9 % IV SOLN: INTRAVENOUS | Status: DC

## 2021-08-19 MED ORDER — LACTATED RINGERS IV SOLN
INTRAVENOUS | Status: AC | PRN
  Administered 2021-08-19: 1000 mL via INTRAVENOUS

## 2021-08-19 MED ORDER — LIDOCAINE 2% (20 MG/ML) 5 ML SYRINGE
INTRAMUSCULAR | Status: DC | PRN
  Administered 2021-08-19: 60 mg via INTRAVENOUS

## 2021-08-19 MED ORDER — PHENYLEPHRINE HCL (PRESSORS) 10 MG/ML IV SOLN
INTRAVENOUS | Status: DC | PRN
  Administered 2021-08-19: 120 ug via INTRAVENOUS

## 2021-08-19 MED ORDER — PROPOFOL 500 MG/50ML IV EMUL
INTRAVENOUS | Status: DC | PRN
  Administered 2021-08-19: 150 ug/kg/min via INTRAVENOUS

## 2021-08-19 SURGICAL SUPPLY — 14 items

## 2021-08-19 NOTE — Op Note (Signed)
[facility] Patient Name: [name] Procedure Date: 08/19/2021 MRN: [id number] Attending MD: Carlota Raspberry. Havery Moros , MD Date of Birth: [birth year] CSN: [id number] Age: 55 Admit Type: Outpatient Procedure:                Upper GI endoscopy Indications:              Dysphagia Providers:                Remo Lipps P. Havery Moros, MD, Jeanella Cara,                            RN, Frazier Richards, Technician Referring MD:              Medicines:                Monitored Anesthesia Care Complications:            No immediate complications. Estimated blood loss:                            Minimal. Estimated Blood Loss:     Estimated blood loss was minimal. Procedure:                Pre-Anesthesia Assessment:                           - Prior to the procedure, a History and Physical                            was performed, and patient medications and                            allergies were reviewed. The patient's tolerance of                            previous anesthesia was also reviewed. The risks                            and benefits of the procedure and the sedation                            options and risks were discussed with the patient.                            All questions were answered, and informed consent                            was obtained. Prior Anticoagulants: The patient has                            taken no previous anticoagulant or antiplatelet                            agents. ASA Grade Assessment: III - A patient with                            severe  systemic disease. After reviewing the risks                            and benefits, the patient was deemed in                            satisfactory condition to undergo the procedure.                           After obtaining informed consent, the endoscope was                            passed under direct vision. Throughout the                            procedure, the  patient's blood pressure, pulse, and                            oxygen saturations were monitored continuously. The                            [id number]-H190 ([id number]) Olympus endoscope was introduced                            through the mouth, and advanced to the second part                            of duodenum. The upper GI endoscopy was                            accomplished without difficulty. The patient                            tolerated the procedure well. Scope In: Scope Out: Findings:      Esophagogastric landmarks were identified: the Z-line was found at 34       cm, the gastroesophageal junction was found at 34 cm and the upper       extent of the gastric folds was found at 37 cm from the incisors.      A 3 cm hiatal hernia was present.      A single 3-4 mm polypoid was found 35 cm from the incisors within the       hernia sac just inferior to the zline. Biopsies were taken with a cold       forceps for histology.      The Z-line was irregular with a tongue of salmon colored mucosa and was       found 34 cm from the incisors, perhaps 1cm in length. Biopsies were       taken with a cold forceps for histology.      The exam of the esophagus was otherwise normal. No focal stenosis /       stricture noted otherwise.      A guidewire was placed and the scope was withdrawn. Empiric dilation was       performed in the entire esophagus with a Savary dilator with mild  resistance at 17 mm. Relook endoscopy showed no mucosal wrents. Biopsies       were taken with a cold forceps in the upper third of the esophagus, in       the middle third of the esophagus and in the lower third of the       esophagus for histology.      A single 3 to 4 mm sessile polyp was found in the gastric body. The       polyp was removed with a cold biopsy forceps. Despite monitoring for       several minutes there was persistent oozing at the base of the       polpectomy site. Snare tip was used to apply  coagulation which provided       immediate hemostasis. Resection and retrieval were complete.      Patchy mildly erythematous mucosa was found in the gastric fundus and in       the gastric body. Biopsies were taken with a cold forceps for       Helicobacter pylori testing.      The exam of the stomach was otherwise normal.      The duodenal bulb and second portion of the duodenum were normal. Impression:               - Esophagogastric landmarks identified.                           - 3 cm hiatal hernia.                           - Small polypoid lesion was found near the GEJ.                            Biopsied.                           - Z-line irregular, 34 cm from the incisors.                            Biopsied.                           - Normal esophagus otherwise - empiric dilation                            performed with random biopsies to rule out EoE.                           - A single gastric polyp. Resected and retrieved.                            Snare tip used to provide coagulation for treatment                            of persistent oozing with good result.                           - Erythematous mucosa in the gastric fundus and  gastric body. Biopsies taken to rule out H pylori                           - Normal stomach otherwise                           - Normal duodenal bulb and second portion of the                            duodenum. Moderate Sedation:      No moderate sedation, case performed with MAC Recommendation:           - Patient has a contact number available for                            emergencies. The signs and symptoms of potential                            delayed complications were discussed with the                            patient. Return to normal activities tomorrow.                            Written discharge instructions were provided to the                            patient.                            - Resume previous diet.                           - Continue present medications.                           - Await pathology results and course post dilation Procedure Code(s):        --- Professional ---                           [phone], Esophagogastroduodenoscopy, flexible,                            transoral; with insertion of guide wire followed by                            passage of dilator(s) through esophagus over guide                            wire                           95638, 59, Esophagogastroduodenoscopy, flexible,                            transoral; with biopsy, single or multiple Diagnosis Code(s):        ---  Professional ---                           K44.9, Diaphragmatic hernia without obstruction or                            gangrene                           K22.8, Other specified diseases of esophagus                           K31.7, Polyp of stomach and duodenum                           K31.89, Other diseases of stomach and duodenum                           R13.10, Dysphagia, unspecified CPT copyright 2019 American Medical Association. All rights reserved. The codes documented in this report are preliminary and upon coder review may  be revised to meet current compliance requirements. Remo Lipps P. Tarina Volk, MD 08/19/2021 8:18:30 AM This report has been signed electronically. Number of Addenda: 0

## 2021-08-19 NOTE — Discharge Instructions (Signed)
YOU HAD AN ENDOSCOPIC PROCEDURE TODAY: Refer to the procedure report and other information in the discharge instructions given to you for any specific questions about what was found during the examination. If this information does not answer your questions, please call [place] office at [phone] to clarify.  ° °YOU SHOULD EXPECT: Some feelings of bloating in the abdomen. Passage of more gas than usual. Walking can help get rid of the air that was put into your GI tract during the procedure and reduce the bloating. If you had a lower endoscopy (such as a colonoscopy or flexible sigmoidoscopy) you may notice spotting of blood in your stool or on the toilet paper. Some abdominal soreness may be present for a day or two, also. ° °DIET: Your first meal following the procedure should be a light meal and then it is ok to progress to your normal diet. A half-sandwich or bowl of soup is an example of a good first meal. Heavy or fried foods are harder to digest and may make you feel nauseous or bloated. Drink plenty of fluids but you should avoid alcoholic beverages for 24 hours. If you had a esophageal dilation, please see attached instructions for diet.   ° °ACTIVITY: Your care partner should take you home directly after the procedure. You should plan to take it easy, moving slowly for the rest of the day. You can resume normal activity the day after the procedure however YOU SHOULD NOT DRIVE, use power tools, machinery or perform tasks that involve climbing or major physical exertion for 24 hours (because of the sedation medicines used during the test).  ° °SYMPTOMS TO REPORT IMMEDIATELY: °A gastroenterologist can be reached at any hour. Please call [phone]  for any of the following symptoms:  °Following lower endoscopy (colonoscopy, flexible sigmoidoscopy) °Excessive amounts of blood in the stool  °Significant tenderness, worsening of abdominal pains  °Swelling of the abdomen that is new, acute  °Fever of 100° or  higher  °Following upper endoscopy (EGD, EUS, ERCP, esophageal dilation) °Vomiting of blood or coffee ground material  °New, significant abdominal pain  °New, significant chest pain or pain under the shoulder blades  °Painful or persistently difficult swallowing  °New shortness of breath  °Black, tarry-looking or red, bloody stools ° °FOLLOW UP:  °If any biopsies were taken you will be contacted by phone or by letter within the next 1-3 weeks. Call [phone]  if you have not heard about the biopsies in 3 weeks.  °Please also call with any specific questions about appointments or follow up tests. ° °

## 2021-08-19 NOTE — Transfer of Care (Signed)
Immediate Anesthesia Transfer of Care Note  Patient: [name]  Procedure(s) Performed: ESOPHAGOGASTRODUODENOSCOPY (EGD) WITH PROPOFOL SAVORY DILATION POLYPECTOMY BIOPSY  Patient Location: PACU and Endoscopy Unit  Anesthesia Type:MAC  Level of Consciousness: drowsy  Airway & Oxygen Therapy: Patient Spontanous Breathing and Patient connected to face mask  Post-op Assessment: Report given to RN and Post -op Vital signs reviewed and stable  Post vital signs: Reviewed and stable  Last Vitals:  Vitals Value Taken Time  BP 133/71 08/19/21 0812  Temp    Pulse 88 08/19/21 0816  Resp 34 08/19/21 0816  SpO2 99 % 08/19/21 0816  Vitals shown include unvalidated device data.  Last Pain:  Vitals:   08/19/21 0656  TempSrc: Oral  PainSc: 0-No pain         Complications: No notable events documented.

## 2021-08-19 NOTE — Anesthesia Postprocedure Evaluation (Signed)
Anesthesia Post Note  Patient: [name]  Procedure(s) Performed: ESOPHAGOGASTRODUODENOSCOPY (EGD) WITH PROPOFOL SAVORY DILATION POLYPECTOMY BIOPSY     Patient location during evaluation: Endoscopy Anesthesia Type: MAC Level of consciousness: awake and alert Pain management: pain level controlled Vital Signs Assessment: post-procedure vital signs reviewed and stable Respiratory status: spontaneous breathing, nonlabored ventilation and respiratory function stable Cardiovascular status: stable and blood pressure returned to baseline Postop Assessment: no apparent nausea or vomiting Anesthetic complications: no   No notable events documented.  Last Vitals:  Vitals:   08/19/21 0830 08/19/21 0838  BP: 134/88 (!) 152/86  Pulse: 86 83  Resp: 14 16  Temp:    SpO2: 99% 100%    Last Pain:  Vitals:   08/19/21 0838  TempSrc:   PainSc: 0-No pain                 Palmer Fahrner,W. EDMOND

## 2021-08-19 NOTE — H&P (Signed)
[facility] History and Physical   Primary Care Physician:  Ladell Pier, MD   Reason for Procedure:   Dysphagia  Plan:    EGD     HPI: Micahel Berg is a 55 y.o. male  here for EGD to evaluate dysphagia. Sister provides translation / history today. He has been having intermittent dysphagia for some time since his TBI. Reports to both solids and liquids. History of seizure disorder, generally this has been controlled. Had an enema a few weeks ago during colonoscopy prep and had odd reaction, question of seizure related to that, but aside of that sister states he has not had any issues with that and is compliant with his medications. No prior EGD. Denies any pains today. No cardiopulmonary symptoms. Otherwise feels at baseline without complaints.   I have discussed risks / benefits of endoscopy and EGD / anesthesia with them and they wish to proceed. Will plan on empiric dilation even if normal exam based on his symptoms, they wish to proceed following discussion of that as well. All questions answered.   Past Medical History:  Diagnosis Date   Diabetes mellitus without complication ([place])    Seizures ([place])    TBI (traumatic brain injury) 03/2017    Past Surgical History:  Procedure Laterality Date   CRANIOTOMY Right 01/13/2017   Procedure: 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, Placement of bone flap in abdomen;  Surgeon: Ashok Pall, MD;  Location: [place];  Service: Neurosurgery;  Laterality: Right;   CRANIOTOMY Right 03/19/2017   Procedure: Cranioplasty with placement of bone flap from abdomen;  Surgeon: Eustace Moore, MD;  Location: [place];  Service: Neurosurgery;  Laterality: Right;    Prior to Admission medications   Medication Sig Start Date End Date Taking? Authorizing Provider  atorvastatin (LIPITOR) 10 MG tablet Take 1 tablet (10 mg total) by mouth daily. 06/19/21 09/17/21 Yes Ladell Pier, MD  Blood Glucose  Monitoring Suppl (TRUE METRIX METER) w/Device KIT Use as directed 04/19/17  Yes Ladell Pier, MD  cetirizine (ZYRTEC) 10 MG tablet Take 1 tablet (10 mg total) by mouth daily. 08/30/19  Yes Meredith Staggers, MD  FLUoxetine (PROZAC) 10 MG capsule TAKE 3 CAPSULES DAILY (30 MG). 08/14/21  Yes Eulis Canner E, NP  glucose blood test strip Use as instructed to check blood sugar once daily. 07/04/21  Yes Ladell Pier, MD  levETIRAcetam (KEPPRA) 500 MG tablet TAKE 1.5 TABLETS TWICE A DAY 01/23/21 01/23/22 Yes Ladell Pier, MD  lisinopril (ZESTRIL) 2.5 MG tablet TAKE 1 TABLET (2.5 MG TOTAL) BY MOUTH DAILY. 02/11/21 02/11/22 Yes Ladell Pier, MD  metFORMIN (GLUCOPHAGE) 1000 MG tablet Take 1 tablet (1,000 mg total) by mouth 2 (two) times daily with a meal. 06/19/21 08/24/21 Yes Ladell Pier, MD  TRUEPLUS LANCETS 28G MISC Use as directed 04/19/17  Yes Ladell Pier, MD  vitamin B-12 (CYANOCOBALAMIN) 500 MCG tablet TAKE 500 MCG BY MOUTH DAILY. 01/29/21 01/29/22 Yes Ladell Pier, MD  SUPREP BOWEL PREP KIT 17.5-3.13-1.6 GM/177ML SOLN Take 1 kit by mouth as directed. For colonoscopy prep 07/17/21   Noralyn Pick, NP    Current Facility-Administered Medications  Medication Dose Route Frequency Provider Last Rate Last Admin   0.9 %  sodium chloride infusion   Intravenous Continuous Destyn Parfitt, Carlota Raspberry, MD       lactated ringers infusion   Intravenous Continuous Gargi Berch, Carlota Raspberry, MD       lactated  ringers infusion    Continuous PRN Rori Goar, Carlota Raspberry, MD 10 mL/hr at 08/19/21 0702 1,000 mL at 08/19/21 0702    Allergies as of 08/12/2021 - Review Complete 08/11/2021  Allergen Reaction Noted   Latex Itching and Rash 04/11/2017    Family History  Problem Relation Age of Onset   Diabetes Mother    Diabetes Father     Social History   Socioeconomic History   Marital status: Single    Spouse name: Not on file   Number of children: 1   Years of education:  12   Highest education level: High school graduate  Occupational History   Occupation: not working  Tobacco Use   Smoking status: Never   Smokeless tobacco: Never  Vaping Use   Vaping Use: Never used  Substance and Sexual Activity   Alcohol use: Yes    Comment: socially, not since accident   Drug use: No   Sexual activity: Not on file  Other Topics Concern   Not on file  Social History Narrative   Lives with sister and her family.  Has one daughter in [place].  Does not work.  Education: high school.    Social Determinants of Health   Financial Resource Strain: Not on file  Food Insecurity: Not on file  Transportation Needs: Not on file  Physical Activity: Not on file  Stress: Not on file  Social Connections: Not on file  Intimate Partner Violence: Not on file    Review of Systems: All other review of systems negative except as mentioned in the HPI.  Physical Exam: Vital signs BP (!) 158/75   Pulse 68   Temp 98.2 F (36.8 C) (Oral)   Resp 16   Ht $R'5\' 3"'IH$  (1.6 m)   Wt 62.6 kg   SpO2 100%   BMI 24.45 kg/m   General:   Alert,  Well-developed, pleasant and cooperative in NAD Lungs:  Clear throughout to auscultation.   Heart:  Regular rate and rhythm Abdomen:  Soft, nontender and nondistended.   Neuro/Psych:  Alert and cooperative. Normal mood and affect. A and O x 3  Jolly Mango, MD [facility]

## 2021-08-20 LAB — SURGICAL PATHOLOGY

## 2021-08-21 ENCOUNTER — Telehealth: Payer: Self-pay

## 2021-08-21 ENCOUNTER — Other Ambulatory Visit: Payer: Self-pay

## 2021-08-21 MED ORDER — METRONIDAZOLE 500 MG PO TABS
500.0000 mg | ORAL_TABLET | Freq: Two times a day (BID) | ORAL | 0 refills | Status: AC
  Filled 2021-08-21: qty 28, 14d supply, fill #0

## 2021-08-21 MED ORDER — AMOXICILLIN 500 MG PO CAPS
1000.0000 mg | ORAL_CAPSULE | Freq: Two times a day (BID) | ORAL | 0 refills | Status: AC
  Filled 2021-08-21: qty 56, 14d supply, fill #0

## 2021-08-21 MED ORDER — CLARITHROMYCIN 500 MG PO TABS
500.0000 mg | ORAL_TABLET | Freq: Two times a day (BID) | ORAL | 0 refills | Status: AC
  Filled 2021-08-21: qty 28, 14d supply, fill #0

## 2021-08-21 MED ORDER — OMEPRAZOLE 40 MG PO CPDR
40.0000 mg | DELAYED_RELEASE_CAPSULE | Freq: Two times a day (BID) | ORAL | 0 refills | Status: DC
  Filled 2021-08-21: qty 28, 14d supply, fill #0

## 2021-08-21 MED FILL — Levetiracetam Tab 500 MG: ORAL | 30 days supply | Qty: 90 | Fill #6 | Status: AC

## 2021-08-21 NOTE — Telephone Encounter (Signed)
-----   Message from Benancio Deeds, MD sent at 08/21/2021  8:07 AM EDT ----- Waynetta Sandy can you please help relay this to the patient's sister [name]. This is a patient of Dr. Elana Alm, I helped perform his recent EGD at the hospital this week - no obvious stenosis to cause dysphagia - biopsies ruled out EoE. I performed empiric Savary dilation, I hope this helped his dysphagia - he incidentally had an irregular z line but biopsies negative for Barrett's - he had a benign small polypoid lesion near the GEJ but was considered to be hyperplastic / benign - but may need some follow up for this over time - incidentally stomach polyp noted and removed which was benign / hyperplastic - he had some gastric erythema - biopsies tested positive for H pylori - In this light I am recommend the following treatment regimen for 14 days: Amoxicillin 1gm BID Clarithromycin 500mg  BID Flagyl 500mg  BID Omeprazole 40mg  BID   Can you please order this if no signfiicant interactions noted. Once done with therapy, the patient should wait one month and perform a stool study for H pylori antigen to ensure negative. The patient should avoid alcohol while taking Flagyl.    I see that he is scheduled for a barium study in a few weeks. He should keep that appointment if his dysphagia persists, may need manometry pending that result if symptoms persist, defer to Dr. . He should probably also have a follow up in the office with Dr. to discuss plan moving forward, patient was unable to tolerate bowel prep for his colonoscopy.    

## 2021-08-21 NOTE — Telephone Encounter (Signed)
Spoke with the sister of the patient. Advised of biopsy results and plan of care. The patient has not complained of any issues with swallowing, and she has continued to have his meals be soft foods for now.  Clarithromycin interacts with Lipitor. I have instructed to hold the Lipitor while on the treatment regimen. Is this okay? What time frame should his return to care be?

## 2021-08-26 ENCOUNTER — Ambulatory Visit (HOSPITAL_COMMUNITY)
Admission: RE | Admit: 2021-08-26 | Discharge: 2021-08-26 | Disposition: A | Discharge status: Home / Self Care | Payer: No Typology Code available for payment source | Source: Ambulatory Visit | Attending: Gastroenterology | Admitting: Gastroenterology

## 2021-08-26 ENCOUNTER — Other Ambulatory Visit: Payer: Self-pay

## 2021-08-26 DIAGNOSIS — R1310 Dysphagia, unspecified: Secondary | ICD-10-CM | POA: Insufficient documentation

## 2021-08-26 DIAGNOSIS — R1312 Dysphagia, oropharyngeal phase: Secondary | ICD-10-CM | POA: Insufficient documentation

## 2021-08-26 NOTE — Progress Notes (Signed)
Modified Barium Swallow Progress Note  Patient Details  Name: [name] MRN: [id number] Date of Birth: 06-12-1966  Today's Date: 08/26/2021  Modified Barium Swallow completed.  Full report located under Chart Review in the Imaging Section.  Brief recommendations include the following:  Clinical Impression  Pt demonstrates a mild oral dyspahgia, likely due to cogntiive changes following TBI. Pt with brief oral holding and lingual pumping/rocking. Pt releases trace spillage which instantly triggers full bolus transit and adequate pharyngeal swallow. There is piecemealing of bolus and occasional trace oral residue that pools in pharynx, eventually triggering a swallow. With solids there is vallecular packing. Pt did not have any coughing or choking during study. Suspect that premature spillage or mild residue post swallow may trigger a cough. Discussed function with sister and reassured her that coughing is a good, safe response that protects the airway and in this case it is unlikely that pt is having any dangerous aspiraiton events. Recommend pt continue a regular diet and thin liquids. No SLP f/u needed for swallowing.   Swallow Evaluation Recommendations       SLP Diet Recommendations: Regular solids;Thin liquid   Liquid Administration via: Cup;Straw   Medication Administration: Whole meds with liquid   Supervision: Patient able to self feed   Compensations: Minimize environmental distractions                Zalika Tieszen, Riley Nearing 08/26/2021,11:50 AM

## 2021-08-26 NOTE — Telephone Encounter (Signed)
Ok to schedule next available office visit in 2 to 3 months for follow-up..  Thank you

## 2021-08-27 ENCOUNTER — Other Ambulatory Visit: Payer: Self-pay

## 2021-08-27 NOTE — Telephone Encounter (Signed)
Ok to do cologuard. He is average risk for colorectal cancer screening. Thanks

## 2021-08-27 NOTE — Telephone Encounter (Signed)
Can the patient do a Cologuard test in place of the colonoscopy? She is concerned the colonoscopy prep will be too difficult for him.

## 2021-09-23 ENCOUNTER — Other Ambulatory Visit: Payer: Self-pay | Admitting: Internal Medicine

## 2021-09-23 ENCOUNTER — Other Ambulatory Visit: Payer: Self-pay

## 2021-09-23 DIAGNOSIS — I10 Essential (primary) hypertension: Secondary | ICD-10-CM

## 2021-09-23 DIAGNOSIS — E1129 Type 2 diabetes mellitus with other diabetic kidney complication: Secondary | ICD-10-CM

## 2021-09-23 DIAGNOSIS — E119 Type 2 diabetes mellitus without complications: Secondary | ICD-10-CM

## 2021-09-23 MED ORDER — ATORVASTATIN CALCIUM 10 MG PO TABS
10.0000 mg | ORAL_TABLET | Freq: Every day | ORAL | 2 refills | Status: DC
  Filled 2021-09-23 (×3): qty 30, 30d supply, fill #0
  Filled 2021-10-30: qty 30, 30d supply, fill #1
  Filled 2021-12-29: qty 30, 30d supply, fill #0

## 2021-09-23 MED ORDER — METFORMIN HCL 1000 MG PO TABS
1000.0000 mg | ORAL_TABLET | Freq: Two times a day (BID) | ORAL | 2 refills | Status: DC
  Filled 2021-09-23: qty 60, 30d supply, fill #0
  Filled 2021-10-30: qty 60, 30d supply, fill #1
  Filled 2021-12-02: qty 60, 30d supply, fill #2
  Filled 2021-12-02: qty 60, 30d supply, fill #0

## 2021-09-23 MED ORDER — LISINOPRIL 2.5 MG PO TABS
ORAL_TABLET | Freq: Every day | ORAL | 6 refills | Status: DC
Start: 2021-09-23 — End: 2022-06-22
  Filled 2021-09-23: qty 30, 30d supply, fill #0
  Filled 2021-10-30: qty 30, 30d supply, fill #1
  Filled 2021-12-02: qty 30, 30d supply, fill #2
  Filled 2021-12-02: qty 30, 30d supply, fill #0
  Filled 2021-12-29: qty 30, 30d supply, fill #1
  Filled 2022-02-24: qty 30, 30d supply, fill #2
  Filled 2022-03-24: qty 30, 30d supply, fill #3
  Filled 2022-05-01: qty 30, 30d supply, fill #4

## 2021-09-23 MED FILL — Levetiracetam Tab 500 MG: ORAL | 30 days supply | Qty: 90 | Fill #7 | Status: AC

## 2021-10-02 ENCOUNTER — Ambulatory Visit: Payer: Self-pay | Attending: Internal Medicine | Admitting: Internal Medicine

## 2021-10-02 ENCOUNTER — Other Ambulatory Visit: Payer: Self-pay

## 2021-10-02 ENCOUNTER — Encounter: Payer: Self-pay | Admitting: Internal Medicine

## 2021-10-02 VITALS — BP 119/74 | HR 74 | Resp 16 | Wt 136.0 lb

## 2021-10-02 DIAGNOSIS — G40909 Epilepsy, unspecified, not intractable, without status epilepticus: Secondary | ICD-10-CM

## 2021-10-02 DIAGNOSIS — Z9181 History of falling: Secondary | ICD-10-CM

## 2021-10-02 DIAGNOSIS — R809 Proteinuria, unspecified: Secondary | ICD-10-CM

## 2021-10-02 DIAGNOSIS — A048 Other specified bacterial intestinal infections: Secondary | ICD-10-CM

## 2021-10-02 DIAGNOSIS — E1129 Type 2 diabetes mellitus with other diabetic kidney complication: Secondary | ICD-10-CM

## 2021-10-02 DIAGNOSIS — R269 Unspecified abnormalities of gait and mobility: Secondary | ICD-10-CM

## 2021-10-02 DIAGNOSIS — Z1211 Encounter for screening for malignant neoplasm of colon: Secondary | ICD-10-CM

## 2021-10-02 DIAGNOSIS — Z23 Encounter for immunization: Secondary | ICD-10-CM

## 2021-10-02 LAB — POCT GLYCOSYLATED HEMOGLOBIN (HGB A1C): Hemoglobin A1C: 5.3 % (ref 4.0–5.6)

## 2021-10-02 LAB — GLUCOSE, POCT (MANUAL RESULT ENTRY): POC Glucose: 127 mg/dl — AB (ref 70–99)

## 2021-10-02 NOTE — Progress Notes (Signed)
Patient ID: [name], male    DOB: [birth year]  MRN: [id number]  CC: chronic ds management  Subjective: [name] is a 55 y.o. male who presents for chronic ds management. Sister [name] is with him and interprets.   His concerns today include:  Pt with history of diabetes type 2 with microalbumin (on Lisinopril for this), HTN, HL, ACD, low testosterone, grand-mal sz and craniotomy due to intracranial bleed, cranial fractures and subdural hematomas 2018 with residual LT sided weakness.  Saw GI since last visit.  He was referred for colon CA screening because of new anemia.  However, pt ended having EGD with colonoscopy.  He also for dysphagia 5 barium study done by speech therapy.  I reviewed the report from the speech therapist.  Patient was to continue regular solids with thin liquids.  Hyperplastic gastric polyp removed; tested positive for H.pylori and was treated. Swallowing okay -sister states he did not have the c-scope because on day of procedure, nurse did an enema on pt even though she told nurse pt had completed the prep.  Pt had sz so procedure while getting enema so procedure had to be rescheduled.  He subsequently ended up having only the EGD.   Noninvasive screening for colon CA recommended since pt is average risk.   DM:   Results for orders placed or performed in visit on 10/02/21  HgB A1c  Result Value Ref Range   Hemoglobin A1C 5.3 4.0 - 5.6 %   HbA1c POC (<> result, manual entry)     HbA1c, POC (prediabetic range)     HbA1c, POC (controlled diabetic range)    taking metformin as prescribed.  Sister tries to avoid having him eat junk snacks as much as possible. Pt walks around the yard for exercise  Sz: no sz since last 1 in October as reported by her sister.  She reports that he sometimes has falls and does not tell her.  He has residual left-sided weakness and gait disturbance since his traumatic brain injury in 2018.  However sister has  noticed that his gait has changed some in that his right forefoot is not touching the ground when he walks.  He has not complained of any headaches.  She sees to it that he takes his Keppra religiously.   Patient Active Problem List   Diagnosis Date Noted   Gastric polyp    Gastritis and gastroduodenitis    History of traumatic brain injury 05/29/2021   MDD (major depressive disorder), recurrent, in partial remission ([place]) 11/12/2020   MDD (major depressive disorder), recurrent episode, moderate ([place]) 07/16/2020   Influenza vaccine needed 07/14/2019   Microalbuminuria due to type 2 diabetes mellitus ([place]) 04/11/2019   Hypothyroidism 09/21/2018   Seizure disorder ([place]) 06/16/2018   Low testosterone 06/16/2018   Traumatic brain injury with loss of consciousness ([place]) 10/29/2017   Anemia, chronic disease 10/29/2017   Positive depression screening 10/29/2017   Attention and concentration deficit    Type 2 diabetes mellitus with peripheral neuropathy ([place])    S/P craniotomy 03/19/2017   Essential hypertension    Dysphagia, oropharyngeal 02/01/2017   Diabetes type 2, uncontrolled 06/13/2015     Current Outpatient Medications on File Prior to Visit  Medication Sig Dispense Refill   atorvastatin (LIPITOR) 10 MG tablet Take 1 tablet (10 mg total) by mouth daily. 30 tablet 2   Blood Glucose Monitoring Suppl (TRUE METRIX METER) w/Device KIT Use as directed 1 kit 0  cetirizine (ZYRTEC) 10 MG tablet Take 1 tablet (10 mg total) by mouth daily. 30 tablet 11   FLUoxetine (PROZAC) 10 MG capsule TAKE 3 CAPSULES DAILY (30 MG). 90 capsule 2   glucose blood test strip Use as instructed to check blood sugar once daily. 100 each 2   levETIRAcetam (KEPPRA) 500 MG tablet TAKE 1.5 TABLETS TWICE A DAY 270 tablet 3   lisinopril (ZESTRIL) 2.5 MG tablet TAKE 1 TABLET (2.5 MG TOTAL) BY MOUTH DAILY. 30 tablet 6   metFORMIN (GLUCOPHAGE) 1000 MG tablet Take 1 tablet (1,000 mg total) by mouth 2 (two) times daily  with a meal. 60 tablet 2   TRUEPLUS LANCETS 28G MISC Use as directed 100 each 6   vitamin B-12 (CYANOCOBALAMIN) 500 MCG tablet TAKE 500 MCG BY MOUTH DAILY. 100 tablet 1   omeprazole (PRILOSEC) 40 MG capsule Take 1 capsule (40 mg total) by mouth 2 (two) times daily before a meal for 14 days. 28 capsule 0   Current Facility-Administered Medications on File Prior to Visit  Medication Dose Route Frequency Provider Last Rate Last Admin   0.9 %  sodium chloride infusion  500 mL Intravenous Once Nandigam, Venia Minks, MD        Allergies  Allergen Reactions   Latex Itching and Rash    Social History   Socioeconomic History   Marital status: Single    Spouse name: Not on file   Number of children: 1   Years of education: 12   Highest education level: High school graduate  Occupational History   Occupation: not working  Tobacco Use   Smoking status: Never   Smokeless tobacco: Never  Vaping Use   Vaping Use: Never used  Substance and Sexual Activity   Alcohol use: Yes    Comment: socially, not since accident   Drug use: No   Sexual activity: Not on file  Other Topics Concern   Not on file  Social History Narrative   Lives with sister and her family.  Has one daughter in [place].  Does not work.  Education: high school.    Social Determinants of Health   Financial Resource Strain: Not on file  Food Insecurity: Not on file  Transportation Needs: Not on file  Physical Activity: Not on file  Stress: Not on file  Social Connections: Not on file  Intimate Partner Violence: Not on file    Family History  Problem Relation Age of Onset   Diabetes Mother    Diabetes Father     Past Surgical History:  Procedure Laterality Date   BIOPSY  08/19/2021   Procedure: BIOPSY;  Surgeon: Yetta Flock, MD;  Location: [facility];  Service: Gastroenterology;;   CRANIOTOMY Right 01/13/2017   Procedure: 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, Placement of  bone flap in abdomen;  Surgeon: Ashok Pall, MD;  Location: [place] [facility];  Service: Neurosurgery;  Laterality: Right;   CRANIOTOMY Right 03/19/2017   Procedure: Cranioplasty with placement of bone flap from abdomen;  Surgeon: Eustace Moore, MD;  Location: [place];  Service: Neurosurgery;  Laterality: Right;   ESOPHAGOGASTRODUODENOSCOPY (EGD) WITH PROPOFOL N/A 08/19/2021   Procedure: ESOPHAGOGASTRODUODENOSCOPY (EGD) WITH PROPOFOL;  Surgeon: Yetta Flock, MD;  Location: [facility];  Service: Gastroenterology;  Laterality: N/A;   POLYPECTOMY  08/19/2021   Procedure: POLYPECTOMY;  Surgeon: Yetta Flock, MD;  Location: [facility];  Service: Gastroenterology;;   Azzie Almas DILATION N/A 08/19/2021   Procedure: SAVORY DILATION;  Surgeon:  Armbruster, Carlota Raspberry, MD;  Location: [facility];  Service: Gastroenterology;  Laterality: N/A;    ROS: Review of Systems Negative except as stated above  PHYSICAL EXAM: BP 119/74   Pulse 74   Resp 16   Wt 136 lb (61.7 kg)   SpO2 100%   BMI 24.09 kg/m   Physical Exam   General appearance - alert, well appearing, and in no distress Mental status -patient answers some questions but most of the history is given by his sister. Neck - supple, no significant adenopathy Chest - clear to auscultation, no wheezes, rales or rhonchi, symmetric air entry Heart - normal rate, regular rhythm, normal S1, S2, no murmurs, rubs, clicks or gallops Neurological -no nystagmus.  Power in the left upper extremity 4/5, right upper extremity 5/5.  It was a bit difficult to assess power in the lower extremity as patient had problems following commands consistently.  Gait is wide-based and he walks on the heel of the right foot with the forefoot not touching the ground completely. Extremities -no lower extremity edema BS 127/A1C 5.3 CMP Latest Ref Rng & Units 01/23/2021 10/01/2020 01/02/2020  Glucose 70 - 99 mg/dL - 94 92  BUN 6 - 23 mg/dL - 25(H) 17  Creatinine 0.40 - 1.50  mg/dL - 0.74 0.74  Sodium 135 - 145 mEq/L - 134(L) 136  Potassium 3.5 - 5.1 mEq/L - 4.9 3.7  Chloride 96 - 112 mEq/L - 101 103  CO2 19 - 32 mEq/L - 29 25  Calcium 8.4 - 10.5 mg/dL - 8.7 8.7(L)  Total Protein 6.0 - 8.5 g/dL 6.7 - 7.1  Total Bilirubin 0.0 - 1.2 mg/dL 0.3 - 0.6  Alkaline Phos 44 - 121 IU/L 56 - 42  AST 0 - 40 IU/L 19 - 15  ALT 0 - 44 IU/L 21 - 20   Lipid Panel     Component Value Date/Time   CHOL 184 01/23/2021 1634   TRIG 103 01/23/2021 1634   HDL 67 01/23/2021 1634   CHOLHDL 2.7 01/23/2021 1634   CHOLHDL 5.2 (H) 06/02/2016 1232   VLDL NOT CALC 06/02/2016 1232   LDLCALC 99 01/23/2021 1634    CBC    Component Value Date/Time   WBC 4.2 07/17/2021 1035   RBC 3.88 (L) 07/17/2021 1035   HGB 12.0 (L) 07/17/2021 1035   HGB 11.7 (L) 01/23/2021 1634   HCT 34.5 (L) 07/17/2021 1035   HCT 33.9 (L) 01/23/2021 1634   PLT 216.0 07/17/2021 1035   PLT 226 01/23/2021 1634   MCV 88.9 07/17/2021 1035   MCV 88 01/23/2021 1634   MCH 30.2 01/23/2021 1634   MCH 30.7 01/02/2020 2249   MCHC 34.7 07/17/2021 1035   RDW 13.0 07/17/2021 1035   RDW 12.5 01/23/2021 1634   LYMPHSABS 1.1 05/10/2018 1849   LYMPHSABS 1.4 04/01/2017 1444   MONOABS 0.3 05/10/2018 1849   EOSABS 0.1 05/10/2018 1849   EOSABS 0.1 04/01/2017 1444   BASOSABS 0.0 05/10/2018 1849   BASOSABS 0.0 04/01/2017 1444    ASSESSMENT AND PLAN: 1. Type 2 diabetes mellitus with microalbuminuria, without long-term current use of insulin (HCC) At goal.  Continue metformin.  Encourage healthy eating habits - HgB A1c - Glucose (CBG)  2. Seizure disorder (HCC) We will check Keppra level given that he had a seizure since I last saw him - Levetiracetam level  3. Gait disturbance 4. History of recent fall We will check Keppra level. Given some recent falls and report of change  in gait, we will check CAT scan of the head - CT HEAD WO CONTRAST (5MM); Future  5. Positive H. pylori test - H.pylori screen, POC  6.  Screening for colon cancer - Fecal occult blood, imunochemical([facility])  7. Need for influenza vaccination Flu shot given.     Patient was given the opportunity to ask questions.  Patient verbalized understanding of the plan and was able to repeat key elements of the plan.  [employer] Language interpreter used during this encounter. #[id number], Hassan Rowan  Orders Placed This Encounter  Procedures   HgB A1c     Requested Prescriptions    No prescriptions requested or ordered in this encounter    No follow-ups on file.  Karle Plumber, MD, FACP

## 2021-10-02 NOTE — Progress Notes (Signed)
F/u with concerns about an endoscopy and colonoscopy Unable to perform b/c pt had a seizure  1 month ago.    [phone]

## 2021-10-03 LAB — H. PYLORI BREATH TEST: H pylori Breath Test: NEGATIVE

## 2021-10-04 LAB — LEVETIRACETAM LEVEL: Levetiracetam Lvl: 31.2 ug/mL (ref 10.0–40.0)

## 2021-10-08 LAB — FECAL OCCULT BLOOD, IMMUNOCHEMICAL: Fecal Occult Bld: NEGATIVE

## 2021-10-09 ENCOUNTER — Ambulatory Visit (HOSPITAL_COMMUNITY)
Admission: RE | Admit: 2021-10-09 | Discharge: 2021-10-09 | Disposition: A | Discharge status: Home / Self Care | Payer: Self-pay | Source: Ambulatory Visit | Attending: Internal Medicine | Admitting: Internal Medicine

## 2021-10-09 DIAGNOSIS — R269 Unspecified abnormalities of gait and mobility: Secondary | ICD-10-CM | POA: Insufficient documentation

## 2021-10-09 DIAGNOSIS — Z9181 History of falling: Secondary | ICD-10-CM | POA: Insufficient documentation

## 2021-10-17 ENCOUNTER — Other Ambulatory Visit: Payer: Self-pay

## 2021-10-17 ENCOUNTER — Ambulatory Visit: Payer: No Typology Code available for payment source | Attending: Internal Medicine

## 2021-10-21 ENCOUNTER — Other Ambulatory Visit: Payer: Self-pay

## 2021-10-21 ENCOUNTER — Encounter (HOSPITAL_COMMUNITY): Payer: Self-pay | Admitting: Psychiatry

## 2021-10-21 ENCOUNTER — Ambulatory Visit (INDEPENDENT_AMBULATORY_CARE_PROVIDER_SITE_OTHER): Payer: No Payment, Other | Admitting: Psychiatry

## 2021-10-21 VITALS — BP 135/70 | HR 68 | Ht 63.0 in | Wt 136.0 lb

## 2021-10-21 DIAGNOSIS — S069XAA Unspecified intracranial injury with loss of consciousness status unknown, initial encounter: Secondary | ICD-10-CM

## 2021-10-21 DIAGNOSIS — G40909 Epilepsy, unspecified, not intractable, without status epilepticus: Secondary | ICD-10-CM | POA: Diagnosis not present

## 2021-10-21 DIAGNOSIS — F3341 Major depressive disorder, recurrent, in partial remission: Secondary | ICD-10-CM

## 2021-10-21 MED ORDER — FLUOXETINE HCL 10 MG PO CAPS
ORAL_CAPSULE | ORAL | 3 refills | Status: DC
  Filled 2021-10-21 – 2021-12-02 (×3): qty 90, 30d supply, fill #0
  Filled 2021-12-02: qty 90, 30d supply, fill #1
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
## 2021-10-29 ENCOUNTER — Other Ambulatory Visit: Payer: Self-pay

## 2021-10-30 ENCOUNTER — Other Ambulatory Visit: Payer: Self-pay

## 2021-10-30 MED FILL — Levetiracetam Tab 500 MG: ORAL | 30 days supply | Qty: 90 | Fill #8 | Status: AC

## 2021-10-31 ENCOUNTER — Other Ambulatory Visit (HOSPITAL_COMMUNITY): Payer: Self-pay | Admitting: Psychiatry

## 2021-10-31 DIAGNOSIS — S069XAA Unspecified intracranial injury with loss of consciousness status unknown, initial encounter: Secondary | ICD-10-CM

## 2021-11-14 ENCOUNTER — Other Ambulatory Visit: Payer: Self-pay

## 2021-11-14 ENCOUNTER — Encounter: Payer: Self-pay | Admitting: Neurology

## 2021-11-14 ENCOUNTER — Ambulatory Visit (INDEPENDENT_AMBULATORY_CARE_PROVIDER_SITE_OTHER): Payer: No Typology Code available for payment source | Admitting: Neurology

## 2021-11-14 VITALS — BP 128/71 | HR 79 | Ht 63.0 in | Wt 137.2 lb

## 2021-11-14 DIAGNOSIS — Z87820 Personal history of traumatic brain injury: Secondary | ICD-10-CM

## 2021-11-14 DIAGNOSIS — R2681 Unsteadiness on feet: Secondary | ICD-10-CM

## 2021-11-14 DIAGNOSIS — G40909 Epilepsy, unspecified, not intractable, without status epilepticus: Secondary | ICD-10-CM

## 2021-11-14 DIAGNOSIS — G40209 Localization-related (focal) (partial) symptomatic epilepsy and epileptic syndromes with complex partial seizures, not intractable, without status epilepticus: Secondary | ICD-10-CM

## 2021-11-14 MED ORDER — LEVETIRACETAM 500 MG PO TABS
ORAL_TABLET | ORAL | 3 refills | Status: DC
  Filled 2021-11-14: qty 270, fill #0
  Filled 2021-12-02: qty 90, 30d supply, fill #0
  Filled 2021-12-02: qty 270, fill #0
  Filled 2021-12-29: qty 90, 30d supply, fill #1
  Filled 2022-02-24: qty 90, 30d supply, fill #2
  Filled 2022-03-24: qty 90, 30d supply, fill #3
  Filled 2022-05-01: qty 90, 30d supply, fill #4
  Filled 2022-06-16 – 2022-06-17 (×2): qty 90, 30d supply, fill #5
  Filled 2022-07-15: qty 90, 30d supply, fill #6
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
## 2021-11-14 NOTE — Patient Instructions (Addendum)
Continue Keppra 500mg : take 1 and 1/2 tablets twice a day  2. Discuss aggressive behavior with Psychiatry and would start looking into moving into facility for increased supervision  3. Refer to physical therapy for gait instability  4. Follow-up in 6 months, call for any changes   Seizure Precautions: 1. If medication has been prescribed for you to prevent seizures, take it exactly as directed.  Do not stop taking the medicine without talking to your doctor first, even if you have not had a seizure in a long time.   2. Avoid activities in which a seizure would cause danger to yourself or to others.  Don't operate dangerous machinery, swim alone, or climb in high or dangerous places, such as on ladders, roofs, or girders.  Do not drive unless your doctor says you may.  3. If you have any warning that you may have a seizure, lay down in a safe place where you can't hurt yourself.    4.  No driving for 6 months from last seizure, as per [facility].   Please refer to the following link on the Epilepsy Foundation of America's website for more information: http://www.epilepsyfoundation.org/answerplace/Social/driving/drivingu.cfm   5.  Maintain good sleep hygiene.  6.  Contact your doctor if you have any problems that may be related to the medicine you are taking.  7.  Call 911 and bring the patient back to the ED if:        A.  The seizure lasts longer than 5 minutes.       B.  The patient doesn't awaken shortly after the seizure  C.  The patient has new problems such as difficulty seeing, speaking or moving  D.  The patient was injured during the seizure  E.  The patient has a temperature over 102 F (39C)  F.  The patient vomited and now is having trouble breathing

## 2021-11-17 ENCOUNTER — Telehealth: Payer: Self-pay

## 2021-11-17 DIAGNOSIS — R2681 Unsteadiness on feet: Secondary | ICD-10-CM

## 2021-11-17 DIAGNOSIS — Z87820 Personal history of traumatic brain injury: Secondary | ICD-10-CM

## 2021-11-17 DIAGNOSIS — G40209 Localization-related (focal) (partial) symptomatic epilepsy and epileptic syndromes with complex partial seizures, not intractable, without status epilepticus: Secondary | ICD-10-CM

## 2021-11-17 NOTE — Telephone Encounter (Signed)
Pt sister called no answer left a voice mail to call the office back. When she calls back send to clinical staff. Need to let her know that he can not get home physical therapy he will need to go to out patient if she is ok with that I will place the order in Epic for a cone location, because pt gets cone assistance

## 2021-11-17 NOTE — Telephone Encounter (Signed)
Spoke with pt sister informed her that we will have to use an out pt facility because of the cone assistance she was fine with that. Order placed in epic

## 2021-11-25 ENCOUNTER — Ambulatory Visit: Payer: No Typology Code available for payment source | Attending: Neurology | Admitting: Rehabilitation

## 2021-11-25 ENCOUNTER — Encounter: Payer: Self-pay | Admitting: Rehabilitation

## 2021-11-25 ENCOUNTER — Other Ambulatory Visit: Payer: Self-pay

## 2021-11-25 DIAGNOSIS — Z87820 Personal history of traumatic brain injury: Secondary | ICD-10-CM | POA: Insufficient documentation

## 2021-11-25 DIAGNOSIS — R293 Abnormal posture: Secondary | ICD-10-CM | POA: Insufficient documentation

## 2021-11-25 DIAGNOSIS — G40209 Localization-related (focal) (partial) symptomatic epilepsy and epileptic syndromes with complex partial seizures, not intractable, without status epilepticus: Secondary | ICD-10-CM | POA: Insufficient documentation

## 2021-11-25 DIAGNOSIS — R2689 Other abnormalities of gait and mobility: Secondary | ICD-10-CM | POA: Insufficient documentation

## 2021-11-25 DIAGNOSIS — R2681 Unsteadiness on feet: Secondary | ICD-10-CM | POA: Insufficient documentation

## 2021-11-26 ENCOUNTER — Telehealth: Payer: Self-pay | Admitting: Internal Medicine

## 2021-11-26 ENCOUNTER — Telehealth: Payer: Self-pay

## 2021-11-26 NOTE — Telephone Encounter (Signed)
Call placed to patient's sister, [name], to discuss support for caring for patient.  She is looking for respite care for a few days or a week.  She is not working and is not able to pay for help.  Informed her that this CM is not aware of free respite programs. When asked if she has applied for Medicaid or disability for him, she said she has not applied for either but she thinks someone called her about Medicaid.  This CM explained that if he has Medicaid he would be eligible for personal care services.  This CM offered to make a referral to [employer] [facility] [place] to determine if he is eligible for Medicaid and assist with submitting the application at no cost but [name] declined and did not want to contact Legal Aid.  This CM then instructed her to contact [facility] to inquire if a Medicaid application has been submitted and she said she will call.  This CM offered to provide her with the phone number for DSS but she declined and said she would look it up.  Instructed her to call this CM back with any questions or if she decides she would like assistance from Legal Aid.

## 2021-11-26 NOTE — Telephone Encounter (Signed)
Opened in error

## 2021-11-26 NOTE — Therapy (Signed)
[facility] [address] [place], [place], [address] Phone: [phone]   Fax:  [phone]  Physical Therapy Evaluation  Patient Details  Name: Jamie BeckwithMiguel Garcia Berg MRN: [id number] Date of Birth: February 01, 1966 Referring Provider (PT): Dr. Karel JarvisAquino   Encounter Date: 11/25/2021   PT End of Session - 11/26/21 0757     Visit Number 1    Number of Visits 17    Date for PT Re-Evaluation 01/24/22    Authorization Type CAFA    PT Start Time 1018    PT Stop Time 1104    PT Time Calculation (min) 46 min    Activity Tolerance Patient tolerated treatment well    Behavior During Therapy [name] for tasks assessed/performed             Past Medical History:  Diagnosis Date   Diabetes mellitus without complication (HCC)    Seizures (HCC)    TBI (traumatic brain injury) 03/2017    Past Surgical History:  Procedure Laterality Date   BIOPSY  08/19/2021   Procedure: BIOPSY;  Surgeon: Benancio DeedsArmbruster, Steven P, MD;  Location: [facility];  Service: Gastroenterology;;   CRANIOTOMY Right 01/13/2017   Procedure: 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, Placement of bone flap in abdomen;  Surgeon: Coletta MemosKyle Cabbell, MD;  [facility];  Service: Neurosurgery;  Laterality: Right;   CRANIOTOMY Right 03/19/2017   Procedure: Cranioplasty with placement of bone flap from abdomen;  Surgeon: [name], [name], MD;  [facility];  Service: Neurosurgery;  Laterality: Right;   ESOPHAGOGASTRODUODENOSCOPY (EGD) WITH PROPOFOL N/A 08/19/2021   Procedure: ESOPHAGOGASTRODUODENOSCOPY (EGD) WITH PROPOFOL;  Surgeon: Benancio DeedsArmbruster, Steven P, MD;  Location: [facility];  Service: Gastroenterology;  Laterality: N/A;   POLYPECTOMY  08/19/2021   Procedure: POLYPECTOMY;  Surgeon: Benancio DeedsArmbruster, Steven P, MD;  [facility];  Service: Gastroenterology;;   Gaspar BiddingSAVORY DILATION N/A 08/19/2021   Procedure: Gaspar BiddingSAVORY DILATION;  Surgeon: Benancio DeedsArmbruster,  Steven P, MD;  Location: [facility];  Service: Gastroenterology;  Laterality: N/A;    There were no vitals filed for this visit.    Subjective Assessment - 11/25/21 1026     Subjective Pt via interpreter reports that he is more unsteady and is falling at home.  Sister reports that she had to stop working in order to help him.    Patient is accompained by: Family member;Interpreter   sister [name]   Pertinent History DMII, seizures, TBI 2018    Limitations Writing;House hold activities    How long can you walk comfortably? His sister will cue him to walk outside on their patio.    Patient Stated Goals Per interpreter, to regain balance.    Currently in Pain? No/denies                [facility] - 11/25/21 1033       Assessment   Medical Diagnosis gait instability    Referring Provider (PT) Dr. Karel JarvisAquino    Onset Date/Surgical Date --   TBI in 2018   Prior Therapy Has had inpatient rehab and OP rehab following TBI      Precautions   Precautions Fall      Balance Screen   Has the patient fallen in the past 6 months Yes    How many times? --   only when sister is not there with him   Has the patient had a decrease in activity level because of a fear of falling?  Yes  Is the patient reluctant to leave their home because of a fear of falling?  Yes      Home Environment   Living Environment Private residence    Living Arrangements Other relatives   sister (her kids in school and husband works)   Available Help at Discharge Available 24 hours/day    Type of Home House    Home Access Level entry   on small step   Home Layout One level    Home Equipment Walker - 2 wheels;Cane - quad;Grab bars - tub/shower   walk in shower     Prior Function   Level of Independence Independent   prior to TBI   Vocation Unemployed    Leisure going outside, watching TV      Cognition   Overall Cognitive Status History of cognitive impairments - at baseline      Sensation    Light Touch Appears Intact      Coordination   Gross Motor Movements are Fluid and Coordinated Yes    Fine Motor Movements are Fluid and Coordinated Yes    Heel Shin Test WFL      ROM / Strength   AROM / PROM / Strength Strength      Strength   Overall Strength Within functional limits for tasks performed      Transfers   Transfers Sit to Stand;Stand to Sit    Sit to Stand 6: Modified independent (Device/Increase time)    Five time sit to stand comments  8.44 secs without UE support    Stand to Sit 6: Modified independent (Device/Increase time)      Ambulation/Gait   Ambulation/Gait Yes    Ambulation/Gait Assistance 5: Supervision    Ambulation/Gait Assistance Details S for safety, but no overt LOB noted.  He does tend to walk with guarded posture and wide BOS    Ambulation Distance (Feet) 200 Feet    Assistive device None    Gait Pattern Step-through pattern;Decreased stride length;Decreased trunk rotation;Wide base of support    Ambulation Surface Level;Indoor    Gait velocity 3.49 ft/sec    Stairs Yes    Stairs Assistance 5: Supervision    Stairs Assistance Details (indicate cue type and reason) S for safety.  Sister reports that he uses UE's to "pull" himself up stairs.  PT also notes this during session    Stair Management Technique One rail Right;Alternating pattern;Forwards    Number of Stairs 4    Height of Stairs 6      Functional Gait  Assessment   Gait assessed  Yes    Gait Level Surface Walks 20 ft in less than 5.5 sec, no assistive devices, good speed, no evidence for imbalance, normal gait pattern, deviates no more than 6 in outside of the 12 in walkway width.    Change in Gait Speed Able to change speed, demonstrates mild gait deviations, deviates 6-10 in outside of the 12 in walkway width, or no gait deviations, unable to achieve a major change in velocity, or uses a change in velocity, or uses an assistive device.    Gait with Horizontal Head Turns Performs  head turns smoothly with slight change in gait velocity (eg, minor disruption to smooth gait path), deviates 6-10 in outside 12 in walkway width, or uses an assistive device.    Gait with Vertical Head Turns Performs task with slight change in gait velocity (eg, minor disruption to smooth gait path), deviates 6 - 10 in outside 12  in walkway width or uses assistive device    Gait and Pivot Turn Pivot turns safely within 3 sec and stops quickly with no loss of balance.    Step Over Obstacle Is able to step over one shoe box (4.5 in total height) without changing gait speed. No evidence of imbalance.                        Objective measurements completed on examination: See above findings.                PT Education - 11/26/21 0755     Education Details Educated on evaluation results, discussed that his decline at home seems to be more behavioral as he did fairly well during PT evaluation (sister endorses that he did better here too), discussed that PT would locate resources for social work/case worker for possible respite care.    Person(s) Educated Patient;Other (comment)   sister   Methods Explanation    Comprehension Verbalized understanding   via interpreter             PT Short Term Goals - 11/26/21 0808       PT SHORT TERM GOAL #1   Title Pt/family will be IND with initial HEP in order to indicate improved functional mobility and dec fall risk. (Target Date: 12/25/21)    Time 4    Period Weeks    Status New    Target Date 12/25/21      PT SHORT TERM GOAL #2   Title Pt will complete FGA and LTG to be added to reflect decreased fall risk.    Time 4    Period Weeks    Status New      PT SHORT TERM GOAL #3   Title Pt will negotiate up/down flight of stairs at distant S level using reciprocal pattern with single UE support demonstrating less need to "pull" with UE.    Time 4    Period Weeks    Status New      PT SHORT TERM GOAL #4   Title Pt  will perform several gym type activities (leg press, light weights, treadmill, etc) at S level in order to indicate safe return to gym.    Time 4    Period Weeks               PT Long Term Goals - 11/26/21 4098       PT LONG TERM GOAL #1   Title Pt/sister will be IND with HEP and gym program in order to indicate improved functional mobility and dec fall risk.    Time 8    Period Weeks    Status New      PT LONG TERM GOAL #2   Title Pt will negotiate up/down 4 stairs without rail at S level in reciprocal pattern in order to indicate improved functional strength.    Time 8    Period Weeks      PT LONG TERM GOAL #3   Title Pt will ambulate x 1000' over varying outdoor surfaces at distant S level (due to cognitive deficits) in order to indicate safety in community.    Time 8    Period Weeks    Status New      PT LONG TERM GOAL #4   Title Pt/sister will report contact with social work in order to discuss respite care/adult day care options so that sister may return to work.  Time 8    Period Weeks    Status New      PT LONG TERM GOAL #5   Title Jog  200' on grassy terrain with soccer ball without LOB to enable pt to return to recreational activities.    Time 8    Period Weeks    Status New                    Plan - 11/26/21 0758     Clinical Impression Statement Pt is a pleasant 56 y/o Spanish speaking male with history of TBI in 2018 that now lives full time with his sister who has stopped working to care for him full time.  She reports that he does not do anything at home unless she tells him and that at times, he can get frustrated and aggressive with her when she presses him to do different tasks.  During PT evaluation, pt was pleasant and followed cues (via interpreter) very well, which sister reports is better than at home.  He demonstrates normal 5TSS without UE support with good functional strength, gait speed is WFL at 3.49 ft/sec without device.  He  does tend to have guarded gait pattern and wide BOS.  Sister reports that one arm is an "alien arm" which is why he doesn't use a device.  Began assessment of FGA however did not finish due to time constraint.  He will likely be at least a moderate fall risk when testing completed at next session.  Feel that he will benefit from neuro PT in order to address high level balance deficits, however am unsure of how well his carryover at home will be.  PT to call his PCP and try to get them set up with social worker for respite care as his sister seems very burned out from caring for him and also voices that she needs to return to work.    Personal Factors and Comorbidities Behavior Pattern;Comorbidity 3+    Comorbidities DMII, seizures, TBI    Examination-Activity Limitations Stairs;Locomotion Level;Transfers;Hygiene/Grooming;Dressing;Bathing   ADL's are more cognitively impacted rather than physical   Examination-Participation Restrictions Community Activity;Driving;Occupation;Shop    Stability/Clinical Decision Making Evolving/Moderate complexity    Clinical Decision Making Moderate    Rehab Potential Good    PT Frequency 2x / week    PT Duration 8 weeks    PT Treatment/Interventions ADLs/Self Care Home Management;Aquatic Therapy;Gait training;Stair training;Functional mobility training;Therapeutic activities;Therapeutic exercise;Balance training;Neuromuscular re-education;Cognitive remediation;Patient/family education;Orthotic Fit/Training;Passive range of motion;Vestibular    PT Next Visit Plan Finish FGA, add LTG, initiate HEP for balance deficits from FGA, work on improving gait (narrow BOS, more relaxed posture), gym type activities as they have returned to gym, outdoor gait, stairs    Consulted and Agree with Plan of Care Patient;Family member/caregiver    Family Member Consulted sister, [name]             Patient will benefit from skilled therapeutic intervention in order to improve the  following deficits and impairments:  Abnormal gait, Decreased activity tolerance, Decreased balance, Decreased cognition, Decreased endurance, Decreased mobility, Decreased safety awareness, Decreased strength, Postural dysfunction  Visit Diagnosis: Unsteadiness on feet  Abnormal posture  Other abnormalities of gait and mobility     Problem List Patient Active Problem List   Diagnosis Date Noted   Gastric polyp    Gastritis and gastroduodenitis    History of traumatic brain injury 05/29/2021   MDD (major depressive disorder), recurrent, in partial remission (HCC)  11/12/2020   MDD (major depressive disorder), recurrent episode, moderate (HCC) 07/16/2020   Influenza vaccine needed 07/14/2019   Microalbuminuria due to type 2 diabetes mellitus (HCC) 04/11/2019   Hypothyroidism 09/21/2018   Seizure disorder (HCC) 06/16/2018   Low testosterone 06/16/2018   Traumatic brain injury with loss of consciousness (HCC) 10/29/2017   Anemia, chronic disease 10/29/2017   Positive depression screening 10/29/2017   Attention and concentration deficit    Type 2 diabetes mellitus with peripheral neuropathy (HCC)    S/P craniotomy 03/19/2017   Essential hypertension    Dysphagia, oropharyngeal 02/01/2017   Diabetes type 2, uncontrolled 06/13/2015   Harriet Butte, PT, MPT [facility] [address] [place], [place], [address] Phone: [phone]   Fax:  [phone] 11/26/21, 8:21 AM   Name: [name] MRN: [id number] Date of Birth: 27-Jul-1966

## 2021-11-26 NOTE — Telephone Encounter (Signed)
[name] with [facility] calling to ask if you can get the pt set up with social worker in regards to Respbid care. Pt sister is his full time care giver and she is 'Burned out" .  Sister also needs to return to work if she can  Cb if need: [phone]

## 2021-12-02 ENCOUNTER — Other Ambulatory Visit: Payer: Self-pay

## 2021-12-02 ENCOUNTER — Ambulatory Visit: Payer: No Typology Code available for payment source

## 2021-12-02 MED FILL — Cyanocobalamin Tab 500 MCG: ORAL | Qty: 100 | Fill #0 | Status: CN

## 2021-12-04 ENCOUNTER — Other Ambulatory Visit: Payer: Self-pay

## 2021-12-04 ENCOUNTER — Ambulatory Visit: Payer: No Typology Code available for payment source | Attending: Internal Medicine | Admitting: Rehabilitation

## 2021-12-04 ENCOUNTER — Encounter: Payer: Self-pay | Admitting: Rehabilitation

## 2021-12-04 DIAGNOSIS — R293 Abnormal posture: Secondary | ICD-10-CM | POA: Insufficient documentation

## 2021-12-04 DIAGNOSIS — M6281 Muscle weakness (generalized): Secondary | ICD-10-CM | POA: Insufficient documentation

## 2021-12-04 DIAGNOSIS — R2689 Other abnormalities of gait and mobility: Secondary | ICD-10-CM | POA: Insufficient documentation

## 2021-12-04 DIAGNOSIS — R2681 Unsteadiness on feet: Secondary | ICD-10-CM | POA: Insufficient documentation

## 2021-12-04 NOTE — Patient Instructions (Signed)
Access Code: [email or web address] URL: [email or web address] Date: 12/04/2021 Prepared by: Harriet Butte  Exercises Soccer Corinna Capra with Pelvic Floor Contractions - 1 x daily - 7 x weekly - 3 sets - 10 reps Standing Single Leg Ball Rolls - Forward Backward - 1 x daily - 7 x weekly - 3 sets - 10 reps Step Up with Pelvic Floor Contraction - 1 x daily - 7 x weekly - 3 sets - 10 reps Stair Negotiation with Single Rail Using Step-Through Pattern (Foot Over Foot) - 1 x daily - 7 x weekly - 3 sets - 10 reps

## 2021-12-04 NOTE — Therapy (Signed)
[facility] [address] [place], [place], [address] Phone: [phone]   Fax:  [phone]  Physical Therapy Treatment  Patient Details  Name: [name] MRN: [id number] Date of Birth: 07-Dec-1965 Referring Provider (PT): Dr. Delice Lesch   Encounter Date: 12/04/2021   PT End of Session - 12/04/21 0956     Visit Number 2    Number of Visits 17    Date for PT Re-Evaluation 01/24/22    Authorization Type CAFA    PT Start Time 0846    PT Stop Time 0930    PT Time Calculation (min) 44 min    Activity Tolerance Patient tolerated treatment well    Behavior During Therapy [facility] for tasks assessed/performed             Past Medical History:  Diagnosis Date   Diabetes mellitus without complication ([place])    Seizures ([place])    TBI (traumatic brain injury) 03/2017    Past Surgical History:  Procedure Laterality Date   BIOPSY  08/19/2021   Procedure: BIOPSY;  Surgeon: Yetta Flock, MD;  [facility];  Service: Gastroenterology;;   CRANIOTOMY Right 01/13/2017   Procedure: 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, Placement of bone flap in abdomen;  Surgeon: Ashok Pall, MD;  Location: [place];  Service: Neurosurgery;  Laterality: Right;   CRANIOTOMY Right 03/19/2017   Procedure: Cranioplasty with placement of bone flap from abdomen;  Surgeon: Eustace Moore, MD;  Location: [facility];  Service: Neurosurgery;  Laterality: Right;   ESOPHAGOGASTRODUODENOSCOPY (EGD) WITH PROPOFOL N/A 08/19/2021   Procedure: ESOPHAGOGASTRODUODENOSCOPY (EGD) WITH PROPOFOL;  Surgeon: Yetta Flock, MD;  [facility];  Service: Gastroenterology;  Laterality: N/A;   POLYPECTOMY  08/19/2021   Procedure: POLYPECTOMY;  Surgeon: Yetta Flock, MD;  [facility];  Service: Gastroenterology;;   Azzie Almas DILATION N/A 08/19/2021   Procedure: Azzie Almas DILATION;  Surgeon: Yetta Flock, MD;  [facility];  Service: Gastroenterology;  Laterality: N/A;    There were no vitals filed for this visit.   Subjective Assessment - 12/04/21 0939     Subjective Pt/sister report no changes since last visit, no pain.    Patient is accompained by: Family member;Interpreter    Limitations Writing;House hold activities    How long can you walk comfortably? His sister will cue him to walk outside on their patio.    Patient Stated Goals Per interpreter, to regain balance.    Currently in Pain? No/denies                               [facility] Adult PT Treatment/Exercise - 12/04/21 0855       Ambulation/Gait   Ambulation/Gait Yes    Ambulation/Gait Assistance 5: Supervision    Ambulation/Gait Assistance Details Into and out of clinic.  BOS seems slightly more narrow today than previous session.  No overt LOB when ambulating from task to task.    Ambulation Distance (Feet) 100 Feet   x 2 reps   Assistive device None    Gait Pattern Step-through pattern;Decreased stride length;Decreased trunk rotation;Wide base of support    Ambulation Surface Level;Indoor    Stairs Yes    Stairs Assistance 5: Supervision    Stairs Assistance Details (indicate cue type and reason) Pt did better with less tendency to pull himself with UEs today and was able to ascend/descend with single  UE.  He does need to descend in step to pattern for safety and due to LE weakness, but overall did much better. Added this to HEP when weather permits.    Stair Management Technique One rail Right;Alternating pattern;Forwards;Step to pattern    Number of Stairs 4   x 4 reps for strengthening   Height of Stairs 6      High Level Balance   High Level Balance Comments Performed soccer ball kicking to wall in hallway for SLS and coordination.  Performed x 4-5 mins with no LOB, provided for HEP.  Also had pt place single LE on soccer ball while standing at counter top and roll  forwards/backwards with light UE support as needed.  Performed 2 sets of 10 on each side.  Due to cognitive and language deficits, was difficult to get pt to not use UEs and to slow down for task to be most beneficial but with repetition he did do well so added to HEP.  Note most difficulty when standing on RLE, therefore had him place LLE on 4" step and perform tasks at countertop (moving cones laterally from one location to another). Discussed that they could set this up at home (sister reports they have a small step) or in front of window and use dry erase markers or have him "clean" window to force RLE weight bearing.  Added to HEP as best able (had interpreter write in Spanish as well to help patient).  Performed quick step ups to bottom stair using BUEs with cues for increased speed and to alternate which foot leading with.  Again, some tasks difficult due to congitive and motor planning deficits.             Therex:  Seated scifit stepper x 5 mins with BUE/LEs >no UEs at level 3 resistance.  Pts sister would like for him to only use LEs as he compensates with UEs at home.     Access Code: [email or web address] URL: [email or web address] Date: 12/04/2021 Prepared by: [place] with Pelvic Floor Contractions - 1 x daily - 7 x weekly - 3 sets - 10 reps Standing Single Leg Ball Rolls - Forward Backward - 1 x daily - 7 x weekly - 3 sets - 10 reps Step Up with Pelvic Floor Contraction - 1 x daily - 7 x weekly - 3 sets - 10 reps Stair Negotiation with Single Rail Using Step-Through Pattern (Foot Over Foot) - 1 x daily - 7 x weekly - 3 sets - 10 reps     PT Education - 12/04/21 0952     Education Details HEP    Person(s) Educated Patient;Caregiver(s)    Methods Explanation;Demonstration;Handout    Comprehension Verbalized understanding;Returned demonstration   via interpreter             PT Short Term Goals - 11/26/21 0808       PT SHORT TERM  GOAL #1   Title Pt/family will be IND with initial HEP in order to indicate improved functional mobility and dec fall risk. (Target Date: 12/25/21)    Time 4    Period Weeks    Status New    Target Date 12/25/21      PT SHORT TERM GOAL #2   Title Pt will complete FGA and LTG to be added to reflect decreased fall risk.    Time 4    Period Weeks    Status New      PT SHORT  TERM GOAL #3   Title Pt will negotiate up/down flight of stairs at distant S level using reciprocal pattern with single UE support demonstrating less need to "pull" with UE.    Time 4    Period Weeks    Status New      PT SHORT TERM GOAL #4   Title Pt will perform several gym type activities (leg press, light weights, treadmill, etc) at S level in order to indicate safe return to gym.    Time 4    Period Weeks               PT Long Term Goals - 11/26/21 [id number]       PT LONG TERM GOAL #1   Title Pt/sister will be IND with HEP and gym program in order to indicate improved functional mobility and dec fall risk.    Time 8    Period Weeks    Status New      PT LONG TERM GOAL #2   Title Pt will negotiate up/down 4 stairs without rail at S level in reciprocal pattern in order to indicate improved functional strength.    Time 8    Period Weeks      PT LONG TERM GOAL #3   Title Pt will ambulate x 1000' over varying outdoor surfaces at distant S level (due to cognitive deficits) in order to indicate safety in community.    Time 8    Period Weeks    Status New      PT LONG TERM GOAL #4   Title Pt/sister will report contact with social work in order to discuss respite care/adult day care options so that sister may return to work.    Time 8    Period Weeks    Status New      PT LONG TERM GOAL #5   Title Jog  200' on grassy terrain with soccer ball without LOB to enable pt to return to recreational activities.    Time 8    Period Weeks    Status New                   Plan - 12/04/21 [id number]      Clinical Impression Statement Skilled session focused on providing and performing initial HEP for balance, strengthening and just increasing overall mobility for endurance.  Some tasks difficult due to cognitive and motor planning deficits but with repetition he was able to complete each task.    Personal Factors and Comorbidities Behavior Pattern;Comorbidity 3+    Comorbidities DMII, seizures, TBI    Examination-Activity Limitations Stairs;Locomotion Level;Transfers;Hygiene/Grooming;Dressing;Bathing   ADL's are more cognitively impacted rather than physical   Examination-Participation Restrictions Community Activity;Driving;Occupation;Shop    Stability/Clinical Decision Making Evolving/Moderate complexity    Rehab Potential Good    PT Frequency 2x / week    PT Duration 8 weeks    PT Treatment/Interventions ADLs/Self Care Home Management;Aquatic Therapy;Gait training;Stair training;Functional mobility training;Therapeutic activities;Therapeutic exercise;Balance training;Neuromuscular re-education;Cognitive remediation;Patient/family education;Orthotic Fit/Training;Passive range of motion;Vestibular    PT Next Visit Plan Finish FGA, add LTG, add to HEP as able, work on improving gait (narrow BOS, more relaxed posture), gym type activities as they have returned to gym, outdoor gait, stairs    Consulted and Agree with Plan of Care Patient;Family member/caregiver    Family Member Consulted sister, [name]             Patient will benefit from skilled therapeutic intervention in order to improve  the following deficits and impairments:  Abnormal gait, Decreased activity tolerance, Decreased balance, Decreased cognition, Decreased endurance, Decreased mobility, Decreased safety awareness, Decreased strength, Postural dysfunction  Visit Diagnosis: Unsteadiness on feet  Abnormal posture  Other abnormalities of gait and mobility     Problem List Patient Active Problem List   Diagnosis Date  Noted   Gastric polyp    Gastritis and gastroduodenitis    History of traumatic brain injury 05/29/2021   MDD (major depressive disorder), recurrent, in partial remission ([place]) 11/12/2020   MDD (major depressive disorder), recurrent episode, moderate ([place]) 07/16/2020   Influenza vaccine needed 07/14/2019   Microalbuminuria due to type 2 diabetes mellitus ([place]) 04/11/2019   Hypothyroidism 09/21/2018   Seizure disorder ([place]) 06/16/2018   Low testosterone 06/16/2018   Traumatic brain injury with loss of consciousness ([place]) 10/29/2017   Anemia, chronic disease 10/29/2017   Positive depression screening 10/29/2017   Attention and concentration deficit    Type 2 diabetes mellitus with peripheral neuropathy ([place])    S/P craniotomy 03/19/2017   Essential hypertension    Dysphagia, oropharyngeal 02/01/2017   Diabetes type 2, uncontrolled 06/13/2015    Cameron Sprang, PT, MPT [facility] [address] [place], [place], [address] Phone: [phone]   Fax:  [phone] 12/04/21, 9:59 AM   Name: [name] MRN: [id number] Date of Birth: 05/15/1966

## 2021-12-08 ENCOUNTER — Other Ambulatory Visit: Payer: Self-pay

## 2021-12-08 ENCOUNTER — Ambulatory Visit: Payer: No Typology Code available for payment source | Admitting: Physical Therapy

## 2021-12-08 ENCOUNTER — Encounter: Payer: Self-pay | Admitting: Physical Therapy

## 2021-12-08 DIAGNOSIS — R293 Abnormal posture: Secondary | ICD-10-CM

## 2021-12-08 DIAGNOSIS — R2681 Unsteadiness on feet: Secondary | ICD-10-CM

## 2021-12-08 DIAGNOSIS — R2689 Other abnormalities of gait and mobility: Secondary | ICD-10-CM

## 2021-12-08 NOTE — Therapy (Signed)
[facility] [address] [place], [place], [address] Phone: [phone]   Fax:  [phone]  Physical Therapy Treatment  Patient Details  Name: [name] MRN: [id number] Date of Birth: 28-Nov-1965 Referring Provider (PT): Dr. Delice Lesch   Encounter Date: 12/08/2021   PT End of Session - 12/08/21 1047     Visit Number 3    Number of Visits 17    Date for PT Re-Evaluation 01/24/22    Authorization Type CAFA    PT Start Time 1015    PT Stop Time 1110    PT Time Calculation (min) 55 min    Activity Tolerance Patient tolerated treatment well    Behavior During Therapy [facility] for tasks assessed/performed             Past Medical History:  Diagnosis Date   Diabetes mellitus without complication ([place])    Seizures ([place])    TBI (traumatic brain injury) 03/2017    Past Surgical History:  Procedure Laterality Date   BIOPSY  08/19/2021   Procedure: BIOPSY;  Surgeon: Yetta Flock, MD;  [facility];  Service: Gastroenterology;;   CRANIOTOMY Right 01/13/2017   Procedure: 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, Placement of bone flap in abdomen;  Surgeon: Ashok Pall, MD;  Location: [facility];  Service: Neurosurgery;  Laterality: Right;   CRANIOTOMY Right 03/19/2017   Procedure: Cranioplasty with placement of bone flap from abdomen;  Surgeon: Eustace Moore, MD;  Location: [facility];  Service: Neurosurgery;  Laterality: Right;   ESOPHAGOGASTRODUODENOSCOPY (EGD) WITH PROPOFOL N/A 08/19/2021   Procedure: ESOPHAGOGASTRODUODENOSCOPY (EGD) WITH PROPOFOL;  Surgeon: Yetta Flock, MD;  [facility];  Service: Gastroenterology;  Laterality: N/A;   POLYPECTOMY  08/19/2021   Procedure: POLYPECTOMY;  Surgeon: Yetta Flock, MD;  [facility];  Service: Gastroenterology;;   Azzie Almas DILATION N/A 08/19/2021   Procedure: Azzie Almas DILATION;  Surgeon: Yetta Flock, MD;  [facility];  Service: Gastroenterology;  Laterality: N/A;    There were no vitals filed for this visit.   Subjective Assessment - 12/08/21 1019     Subjective Pt's sister reports taking pt most days to gym.    Patient is accompained by: Family member;Interpreter    Limitations Writing;House hold activities    How long can you walk comfortably? His sister will cue him to walk outside on their patio.    Patient Stated Goals Per interpreter, to regain balance.                               [place] Adult PT Treatment/Exercise - 12/08/21 0001       Transfers   Transfers Floor to Transfer   multiple reps pt using little to no external UE support beside the floor, increase time needed, supervision level.     Ambulation/Gait   Ambulation/Gait Yes    Ambulation/Gait Assistance 5: Supervision    Ambulation/Gait Assistance Details ambulated around gym for various activites. noted WBOS but no LOB.    Ambulation Distance (Feet) 100 Feet    Assistive device None    Gait Pattern Step-through pattern;Decreased stride length;Decreased trunk rotation;Wide base of support    Ambulation Surface Level;Indoor      Exercises   Exercises Knee/Hip      Knee/Hip Exercises: Aerobic   Stepper Sci fit stepper level 5.0  LE only holding weighted weight. 10 min, speed 82.  Knee/Hip Exercises: Machines for Strengthening   Total Gym Leg Press 60# bil LEs x10, cues for initiating movement + 70 # working on pt initiating movment x10 min cues given.              [facility]) - 12/08/21 1327     [employer]! Step in Quadruped used this exercise to work on LE and core strengthening, balance, and to decrease UE support at the end of the motion. pt required multimodal cues but was able to follow through given time. added exercise to HEP.                  PT Education - 12/08/21 1318     Education Details Verbally reviewed HEP and pt/sister do not have  questions about it.  Added 1 exercise for core and LE strengthening, and balance.  Gave recommendations: pt could hold weighted ball at gym vs. using UE with seated bike, suggestions for functional, meaningful tasks pt could do with other family members to relieve primary caregiver.  Primary caregiver did give personal objections for getting social work involved with pt's care.    Person(s) Educated Patient;Caregiver(s)    Methods Explanation    Comprehension Verbalized understanding              PT Short Term Goals - 11/26/21 0808       PT SHORT TERM GOAL #1   Title Pt/family will be IND with initial HEP in order to indicate improved functional mobility and dec fall risk. (Target Date: 12/25/21)    Time 4    Period Weeks    Status New    Target Date 12/25/21      PT SHORT TERM GOAL #2   Title Pt will complete FGA and LTG to be added to reflect decreased fall risk.    Time 4    Period Weeks    Status New      PT SHORT TERM GOAL #3   Title Pt will negotiate up/down flight of stairs at distant S level using reciprocal pattern with single UE support demonstrating less need to "pull" with UE.    Time 4    Period Weeks    Status New      PT SHORT TERM GOAL #4   Title Pt will perform several gym type activities (leg press, light weights, treadmill, etc) at S level in order to indicate safe return to gym.    Time 4    Period Weeks               PT Long Term Goals - 11/26/21 [id number]       PT LONG TERM GOAL #1   Title Pt/sister will be IND with HEP and gym program in order to indicate improved functional mobility and dec fall risk.    Time 8    Period Weeks    Status New      PT LONG TERM GOAL #2   Title Pt will negotiate up/down 4 stairs without rail at S level in reciprocal pattern in order to indicate improved functional strength.    Time 8    Period Weeks      PT LONG TERM GOAL #3   Title Pt will ambulate x 1000' over varying outdoor surfaces at distant S level (due  to cognitive deficits) in order to indicate safety in community.    Time 8    Period Weeks    Status New  PT LONG TERM GOAL #4   Title Pt/sister will report contact with social work in order to discuss respite care/adult day care options so that sister may return to work.    Time 8    Period Weeks    Status New      PT LONG TERM GOAL #5   Title Jog  200' on grassy terrain with soccer ball without LOB to enable pt to return to recreational activities.    Time 8    Period Weeks    Status New                   Plan - 12/08/21 1329     Clinical Impression Statement Pt is following through with initial HEP. He was able to exercise on seated Sci fit stepper with little to no cues using LEs only while holding a weighted ball.  Pt progressed training with floor transfers and transitional movments from quadruped to 1/2 kneel at [place] level.    Personal Factors and Comorbidities Behavior Pattern;Comorbidity 3+    Comorbidities DMII, seizures, TBI    Examination-Activity Limitations Stairs;Locomotion Level;Transfers;Hygiene/Grooming;Dressing;Bathing   ADL's are more cognitively impacted rather than physical   Examination-Participation Restrictions Community Activity;Driving;Occupation;Shop    Stability/Clinical Decision Making Evolving/Moderate complexity    Rehab Potential Good    PT Frequency 2x / week    PT Duration 8 weeks    PT Treatment/Interventions ADLs/Self Care Home Management;Aquatic Therapy;Gait training;Stair training;Functional mobility training;Therapeutic activities;Therapeutic exercise;Balance training;Neuromuscular re-education;Cognitive remediation;Patient/family education;Orthotic Fit/Training;Passive range of motion;Vestibular    PT Next Visit Plan Finish FGA, add LTG, Exercises from [employer]! moves in quadruped maybe helpful (pt has [employer]! move sheet but was only assigned the last exercise).  add to HEP as able, work on improving gait (narrow BOS, more relaxed  posture), gym type activities as they have returned to gym, outdoor gait, stairs    Consulted and Agree with Plan of Care Patient;Family member/caregiver    Family Member Consulted sister, [name]             Patient will benefit from skilled therapeutic intervention in order to improve the following deficits and impairments:  Abnormal gait, Decreased activity tolerance, Decreased balance, Decreased cognition, Decreased endurance, Decreased mobility, Decreased safety awareness, Decreased strength, Postural dysfunction  Visit Diagnosis: Unsteadiness on feet  Abnormal posture  Other abnormalities of gait and mobility     Problem List Patient Active Problem List   Diagnosis Date Noted   Gastric polyp    Gastritis and gastroduodenitis    History of traumatic brain injury 05/29/2021   MDD (major depressive disorder), recurrent, in partial remission ([place]) 11/12/2020   MDD (major depressive disorder), recurrent episode, moderate ([place]) 07/16/2020   Influenza vaccine needed 07/14/2019   Microalbuminuria due to type 2 diabetes mellitus ([place]) 04/11/2019   Hypothyroidism 09/21/2018   Seizure disorder ([place]) 06/16/2018   Low testosterone 06/16/2018   Traumatic brain injury with loss of consciousness ([place]) 10/29/2017   Anemia, chronic disease 10/29/2017   Positive depression screening 10/29/2017   Attention and concentration deficit    Type 2 diabetes mellitus with peripheral neuropathy ([place])    S/P craniotomy 03/19/2017   Essential hypertension    Dysphagia, oropharyngeal 02/01/2017   Diabetes type 2, uncontrolled 06/13/2015    Bjorn Loser, PTA  12/08/21, 1:35 PM   [facility] [address] [place], [place], [address] Phone: [phone]   Fax:  [phone]  Name: [name] MRN: [id number] Date  of Birth: 23-Oct-1966

## 2021-12-10 ENCOUNTER — Ambulatory Visit: Payer: No Typology Code available for payment source | Admitting: Neurology

## 2021-12-11 ENCOUNTER — Ambulatory Visit: Payer: Self-pay

## 2021-12-11 ENCOUNTER — Other Ambulatory Visit: Payer: Self-pay

## 2021-12-11 DIAGNOSIS — R2689 Other abnormalities of gait and mobility: Secondary | ICD-10-CM

## 2021-12-11 DIAGNOSIS — M6281 Muscle weakness (generalized): Secondary | ICD-10-CM

## 2021-12-11 DIAGNOSIS — R2681 Unsteadiness on feet: Secondary | ICD-10-CM

## 2021-12-11 NOTE — Therapy (Signed)
[facility] [address] [place], [place], [address] Phone: [phone]   Fax:  [phone]  Physical Therapy Treatment  Patient Details  Name: [name] MRN: [id number] Date of Birth: [birth year] Referring Provider (PT): Dr. Delice Lesch   Encounter Date: 12/11/2021   PT End of Session - 12/11/21 0850     Visit Number 4    Number of Visits 17    Date for PT Re-Evaluation 01/24/22    Authorization Type CAFA    PT Start Time 0847    PT Stop Time 0930    PT Time Calculation (min) 43 min    Activity Tolerance Patient tolerated treatment well    Behavior During Therapy [facility] for tasks assessed/performed             Past Medical History:  Diagnosis Date   Diabetes mellitus without complication ([place])    Seizures ([place])    TBI (traumatic brain injury) 03/2017    Past Surgical History:  Procedure Laterality Date   BIOPSY  08/19/2021   Procedure: BIOPSY;  Surgeon: Yetta Flock, MD;  Location: [facility];  Service: Gastroenterology;;   CRANIOTOMY Right 01/13/2017   Procedure: 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, Placement of bone flap in abdomen;  Surgeon: Ashok Pall, MD;  Location: [facility];  Service: Neurosurgery;  Laterality: Right;   CRANIOTOMY Right 03/19/2017   Procedure: Cranioplasty with placement of bone flap from abdomen;  Surgeon: Eustace Moore, MD;  Location: [facility];  Service: Neurosurgery;  Laterality: Right;   ESOPHAGOGASTRODUODENOSCOPY (EGD) WITH PROPOFOL N/A 08/19/2021   Procedure: ESOPHAGOGASTRODUODENOSCOPY (EGD) WITH PROPOFOL;  Surgeon: Yetta Flock, MD;  [facility]: [facility];  Service: Gastroenterology;  Laterality: N/A;   POLYPECTOMY  08/19/2021   Procedure: POLYPECTOMY;  Surgeon: Yetta Flock, MD;  Location: [facility];  Service: Gastroenterology;;   Azzie Almas DILATION N/A 08/19/2021   Procedure: Azzie Almas DILATION;  Surgeon: Yetta Flock, MD;  [facility];  Service: Gastroenterology;  Laterality: N/A;    There were no vitals filed for this visit.   Subjective Assessment - 12/11/21 0850     Subjective Pt's sister present and agreeable to interpret for him. Both he and sister agreeable to this versus using video intrepreter. They will be going to gym after session today. They go most afternoons usually. Sister wants him to get stronger in legs and not rely on arms so much as well as improve balance.    Patient is accompained by: Family member;Interpreter    Limitations Writing;House hold activities    How long can you walk comfortably? His sister will cue him to walk outside on their patio.    Patient Stated Goals Per interpreter, to regain balance.    Currently in Pain? No/denies                               [facility] Adult PT Treatment/Exercise - 12/11/21 0852       Ambulation/Gait   Ambulation/Gait Yes    Ambulation/Gait Assistance 5: Supervision    Ambulation/Gait Assistance Details around in clinic between activities    Assistive device None    Gait Pattern Step-through pattern;Decreased step length - right;Decreased step length - left;Decreased trunk rotation    Ambulation Surface Level;Indoor      Neuro Re-ed    Neuro Re-ed Details  Alternating toe taps on soccerball x 20 CGA with verbal cues to  go slower to be more controlled. Pt challenged with right SLS. Performed standing with LLE on soccerball 10 sec x 3 to increase right stance time. Pt was given verbal and tactile cues to stay up tall and squeeze bottom.  Standing with LLE on 2" bolster with reaching across for 14 bean bags and tossing x 2 again with cues for upright posture. Standing on rockerboard positioned ant/post trying to maintain level x 30 sec, attempted eyes closed but pt could not keep eyes closed, added in tossing 2.2# ball with sister with PT guarding CGA. Rockerboard turned lateral and worked on trying to  maintain level x 30 sec CGA, then added in alternating shoulder flexion x 10 and then tossing ball again x 20 reps.      Exercises   Exercises Other Exercises      Knee/Hip Exercises: Aerobic   Stepper Sci fit stepper level 5.0  LE only holding weighted 2.2# ball 8 min with verbal cues to push fully through legs for big motions.                     PT Education - 12/11/21 1337     Education Details Discussed working on balance with pt standing on pillow at home and having him toss ball to plastic bball hoop if they get one or just a basket so sister can spot him. Also can perform alternating toe taps on soccerball at counter so can touch if needed working on slow controlled movements.    Person(s) Educated Patient;Other (comment)   sister   Methods Explanation;Demonstration    Comprehension Verbalized understanding;Returned demonstration              PT Short Term Goals - 11/26/21 0808       PT SHORT TERM GOAL #1   Title Pt/family will be IND with initial HEP in order to indicate improved functional mobility and dec fall risk. (Target Date: 12/25/21)    Time 4    Period Weeks    Status New    Target Date 12/25/21      PT SHORT TERM GOAL #2   Title Pt will complete FGA and LTG to be added to reflect decreased fall risk.    Time 4    Period Weeks    Status New      PT SHORT TERM GOAL #3   Title Pt will negotiate up/down flight of stairs at distant S level using reciprocal pattern with single UE support demonstrating less need to "pull" with UE.    Time 4    Period Weeks    Status New      PT SHORT TERM GOAL #4   Title Pt will perform several gym type activities (leg press, light weights, treadmill, etc) at S level in order to indicate safe return to gym.    Time 4    Period Weeks               PT Long Term Goals - 11/26/21 [id number]       PT LONG TERM GOAL #1   Title Pt/sister will be IND with HEP and gym program in order to indicate improved functional  mobility and dec fall risk.    Time 8    Period Weeks    Status New      PT LONG TERM GOAL #2   Title Pt will negotiate up/down 4 stairs without rail at S level in reciprocal pattern in order to indicate  improved functional strength.    Time 8    Period Weeks      PT LONG TERM GOAL #3   Title Pt will ambulate x 1000' over varying outdoor surfaces at distant S level (due to cognitive deficits) in order to indicate safety in community.    Time 8    Period Weeks    Status New      PT LONG TERM GOAL #4   Title Pt/sister will report contact with social work in order to discuss respite care/adult day care options so that sister may return to work.    Time 8    Period Weeks    Status New      PT LONG TERM GOAL #5   Title Jog  200' on grassy terrain with soccer ball without LOB to enable pt to return to recreational activities.    Time 8    Period Weeks    Status New                   Plan - 12/11/21 1339     Clinical Impression Statement Continued to work on balance activities trying to make them more functional things that pt could do with sister at home like tossing ball. Pt initially challenged with standing upright but improved as went on. Right SLS is still more challenging.    Personal Factors and Comorbidities Behavior Pattern;Comorbidity 3+    Comorbidities DMII, seizures, TBI    Examination-Activity Limitations Stairs;Locomotion Level;Transfers;Hygiene/Grooming;Dressing;Bathing   ADL's are more cognitively impacted rather than physical   Examination-Participation Restrictions Community Activity;Driving;Occupation;Shop    Stability/Clinical Decision Making Evolving/Moderate complexity    Rehab Potential Good    PT Frequency 2x / week    PT Duration 8 weeks    PT Treatment/Interventions ADLs/Self Care Home Management;Aquatic Therapy;Gait training;Stair training;Functional mobility training;Therapeutic activities;Therapeutic exercise;Balance training;Neuromuscular  re-education;Cognitive remediation;Patient/family education;Orthotic Fit/Training;Passive range of motion;Vestibular    PT Next Visit Plan Finish FGA, add LTG, try treadmill next time to assess safety for gym. add to HEP as able, work on improving gait (narrow BOS, more relaxed posture), gym type activities as they have returned to gym, outdoor gait, stairs    Consulted and Agree with Plan of Care Patient;Family member/caregiver    Family Member Consulted sister, [name]             Patient will benefit from skilled therapeutic intervention in order to improve the following deficits and impairments:  Abnormal gait, Decreased activity tolerance, Decreased balance, Decreased cognition, Decreased endurance, Decreased mobility, Decreased safety awareness, Decreased strength, Postural dysfunction  Visit Diagnosis: Other abnormalities of gait and mobility  Muscle weakness (generalized)  Unsteadiness on feet     Problem List Patient Active Problem List   Diagnosis Date Noted   Gastric polyp    Gastritis and gastroduodenitis    History of traumatic brain injury 05/29/2021   MDD (major depressive disorder), recurrent, in partial remission (HCC) 11/12/2020   MDD (major depressive disorder), recurrent episode, moderate (HCC) 07/16/2020   Influenza vaccine needed 07/14/2019   Microalbuminuria due to type 2 diabetes mellitus (HCC) 04/11/2019   Hypothyroidism 09/21/2018   Seizure disorder (HCC) 06/16/2018   Low testosterone 06/16/2018   Traumatic brain injury with loss of consciousness (HCC) 10/29/2017   Anemia, chronic disease 10/29/2017   Positive depression screening 10/29/2017   Attention and concentration deficit    Type 2 diabetes mellitus with peripheral neuropathy (HCC)    S/P craniotomy 03/19/2017   Essential hypertension  Dysphagia, oropharyngeal 02/01/2017   Diabetes type 2, uncontrolled 06/13/2015    Electa Sniff, PT, DPT, NCS 12/11/2021, 1:41 PM  [facility] [address] [place], [place], [address] Phone: [phone]   Fax:  [phone]  Name: [name] MRN: [id number] Date of Birth: 11-Jul-1966

## 2021-12-16 ENCOUNTER — Other Ambulatory Visit: Payer: Self-pay

## 2021-12-16 ENCOUNTER — Ambulatory Visit: Payer: No Typology Code available for payment source

## 2021-12-16 DIAGNOSIS — R2681 Unsteadiness on feet: Secondary | ICD-10-CM

## 2021-12-16 DIAGNOSIS — R293 Abnormal posture: Secondary | ICD-10-CM

## 2021-12-16 DIAGNOSIS — R2689 Other abnormalities of gait and mobility: Secondary | ICD-10-CM

## 2021-12-16 NOTE — Therapy (Signed)
[facility] [address] [place], [place], [address] Phone: [phone]   Fax:  [phone]  Physical Therapy Treatment  Patient Details  Name: [name] MRN: [id number] Date of Birth: [birth year] Referring Provider (PT): Dr. Karel Jarvis   Encounter Date: 12/16/2021   PT End of Session - 12/16/21 0940     Visit Number 5    Number of Visits 17    Date for PT Re-Evaluation 01/24/22    Authorization Type CAFA    PT Start Time 0937    PT Stop Time 1014    PT Time Calculation (min) 37 min    Equipment Utilized During Treatment Gait belt    Activity Tolerance Patient tolerated treatment well    Behavior During Therapy Impulsive             Past Medical History:  Diagnosis Date   Diabetes mellitus without complication (HCC)    Seizures (HCC)    TBI (traumatic brain injury) 03/2017    Past Surgical History:  Procedure Laterality Date   BIOPSY  08/19/2021   Procedure: BIOPSY;  Surgeon: Benancio Deeds, MD;  [facility]: [facility];  Service: Gastroenterology;;   CRANIOTOMY Right 01/13/2017   Procedure: 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, Placement of bone flap in abdomen;  Surgeon: Coletta Memos, MD;  Location: [facility];  Service: Neurosurgery;  Laterality: Right;   CRANIOTOMY Right 03/19/2017   Procedure: Cranioplasty with placement of bone flap from abdomen;  Surgeon: Tia Alert, MD;  Location: [facility];  Service: Neurosurgery;  Laterality: Right;   ESOPHAGOGASTRODUODENOSCOPY (EGD) WITH PROPOFOL N/A 08/19/2021   Procedure: ESOPHAGOGASTRODUODENOSCOPY (EGD) WITH PROPOFOL;  Surgeon: Benancio Deeds, MD;  Location: [facility];  Service: Gastroenterology;  Laterality: N/A;   POLYPECTOMY  08/19/2021   Procedure: POLYPECTOMY;  Surgeon: Benancio Deeds, MD;  Location: [facility];  Service: Gastroenterology;;   Gaspar Bidding DILATION N/A 08/19/2021   Procedure: Gaspar Bidding  DILATION;  Surgeon: Benancio Deeds, MD;  [facility]: [facility];  Service: Gastroenterology;  Laterality: N/A;    There were no vitals filed for this visit.   Subjective Assessment - 12/16/21 0940     Subjective Pt presents with sister and interperter. Reports that he fell yesterday when trying to use the soccerball playing with nephew. No one was spotting him. This week he has had to get up a little earlier as well.    Patient is accompained by: Family member;Interpreter   in person interpreter, Lamar Laundry   Pertinent History DM2, seizure, TBI 2018    Patient Stated Goals To regain balance.    Currently in Pain? No/denies                               [facility] Adult PT Treatment/Exercise - 12/16/21 0944       Transfers   Transfers Sit to Stand;Stand to Sit    Sit to Stand 5: Supervision    Sit to Stand Details Verbal cues for technique    Sit to Stand Details (indicate cue type and reason) Pt was cued to keep weight more towards toes and slow down.    Stand to Sit 5: Supervision      Ambulation/Gait   Ambulation/Gait Yes    Ambulation/Gait Assistance 5: Supervision;4: Min guard    Ambulation/Gait Assistance Details Pt was cued to try to increase heel strike and narrow BOS some.    Ambulation Distance (  Feet) 230 Feet    Assistive device None    Gait Pattern Step-through pattern;Decreased arm swing - right;Decreased arm swing - left;Decreased step length - right;Decreased step length - left;Wide base of support    Ambulation Surface Level;Indoor    Gait Comments Gait over treadmill x 6 min at 1. and 1. with cuing to increase step length for more heel strike every ~20 sec as steps became shorter. Attention span is reduced. Pt was noted to have more narrow BOS on treadmill.      Neuro Re-ed    Neuro Re-ed Details  Dynamic gait activities over floor ladder: reciprocal stepping x 6 bouts, then added in marching steps x 4 bouts, then having pt pick up  therastones every other step to get him to slow down and dual task some x 4 bouts. Standing alternating taps on therastone x 10. Pt needed max cuing to perform activities with short attention span. Sit to stand on airex x 10 with verbal cues to lean forward and go slow.                     PT Education - 12/16/21 1024     Education Details PT discussed with sister activities that would be safe for gym. Pt can perform SciFit on own but full movements decrease. Needs someone with him at all times on treadmiill due to short attention span with frequent cuing needs as well as assistance on/off. If performing balance needs someone with him at all times to guard. Explained the lack of attention and impulsiveness from TBI.    Person(s) Educated Patient;Caregiver(s)    Methods Explanation    Comprehension Verbalized understanding              PT Short Term Goals - 11/26/21 0808       PT SHORT TERM GOAL #1   Title Pt/family will be IND with initial HEP in order to indicate improved functional mobility and dec fall risk. (Target Date: 12/25/21)    Time 4    Period Weeks    Status New    Target Date 12/25/21      PT SHORT TERM GOAL #2   Title Pt will complete FGA and LTG to be added to reflect decreased fall risk.    Time 4    Period Weeks    Status New      PT SHORT TERM GOAL #3   Title Pt will negotiate up/down flight of stairs at distant S level using reciprocal pattern with single UE support demonstrating less need to "pull" with UE.    Time 4    Period Weeks    Status New      PT SHORT TERM GOAL #4   Title Pt will perform several gym type activities (leg press, light weights, treadmill, etc) at S level in order to indicate safe return to gym.    Time 4    Period Weeks               PT Long Term Goals - 11/26/21 8592       PT LONG TERM GOAL #1   Title Pt/sister will be IND with HEP and gym program in order to indicate improved functional mobility and dec fall  risk.    Time 8    Period Weeks    Status New      PT LONG TERM GOAL #2   Title Pt will negotiate up/down 4 stairs without rail  at S level in reciprocal pattern in order to indicate improved functional strength.    Time 8    Period Weeks      PT LONG TERM GOAL #3   Title Pt will ambulate x 1000' over varying outdoor surfaces at distant S level (due to cognitive deficits) in order to indicate safety in community.    Time 8    Period Weeks    Status New      PT LONG TERM GOAL #4   Title Pt/sister will report contact with social work in order to discuss respite care/adult day care options so that sister may return to work.    Time 8    Period Weeks    Status New      PT LONG TERM GOAL #5   Title Jog  200' on grassy terrain with soccer ball without LOB to enable pt to return to recreational activities.    Time 8    Period Weeks    Status New                   Plan - 12/16/21 1027     Clinical Impression Statement Pt needs frequent cuing to stay on task due to short attention span. Explained this to sister as is safety issue with balance and gym activities. Pt was able to decrease BOS with gait on treadmill but did not see carryover overground.    Rehab Potential Good    PT Frequency 2x / week    PT Duration 8 weeks    PT Treatment/Interventions ADLs/Self Care Home Management;Aquatic Therapy;Gait training;Stair training;Functional mobility training;Therapeutic activities;Therapeutic exercise;Balance training;Neuromuscular re-education;Cognitive remediation;Patient/family education;Orthotic Fit/Training;Passive range of motion;Vestibular    PT Next Visit Plan Finish FGA, add LTG. add to HEP as able, work on improving gait (narrow BOS, more relaxed posture), gym type activities as they have returned to gym, outdoor gait, stairs             Patient will benefit from skilled therapeutic intervention in order to improve the following deficits and impairments:     Visit  Diagnosis: Other abnormalities of gait and mobility  Unsteadiness on feet  Abnormal posture     Problem List Patient Active Problem List   Diagnosis Date Noted   Gastric polyp    Gastritis and gastroduodenitis    History of traumatic brain injury 05/29/2021   MDD (major depressive disorder), recurrent, in partial remission (HCC) 11/12/2020   MDD (major depressive disorder), recurrent episode, moderate (HCC) 07/16/2020   Influenza vaccine needed 07/14/2019   Microalbuminuria due to type 2 diabetes mellitus (HCC) 04/11/2019   Hypothyroidism 09/21/2018   Seizure disorder (HCC) 06/16/2018   Low testosterone 06/16/2018   Traumatic brain injury with loss of consciousness (HCC) 10/29/2017   Anemia, chronic disease 10/29/2017   Positive depression screening 10/29/2017   Attention and concentration deficit    Type 2 diabetes mellitus with peripheral neuropathy (HCC)    S/P craniotomy 03/19/2017   Essential hypertension    Dysphagia, oropharyngeal 02/01/2017   Diabetes type 2, uncontrolled 06/13/2015   Elmer Bales, PT, DPT, NCS  12/16/2021, 10:31 AM  [facility] [address] [place], [place], [address] Phone: [phone]   Fax:  [phone]  Name: [name] MRN: [id number] Date of Birth: 07-01-1966

## 2021-12-18 ENCOUNTER — Ambulatory Visit: Payer: No Typology Code available for payment source

## 2021-12-18 ENCOUNTER — Other Ambulatory Visit: Payer: Self-pay

## 2021-12-18 DIAGNOSIS — R2681 Unsteadiness on feet: Secondary | ICD-10-CM

## 2021-12-18 DIAGNOSIS — M6281 Muscle weakness (generalized): Secondary | ICD-10-CM

## 2021-12-18 DIAGNOSIS — R2689 Other abnormalities of gait and mobility: Secondary | ICD-10-CM

## 2021-12-18 NOTE — Therapy (Signed)
[facility] [address] [place], [place], [address] Phone: [phone]   Fax:  [phone]  Physical Therapy Treatment  Patient Details  Name: [name] MRN: [id number] Date of Birth: [birth year] Referring Provider (PT): Dr. Delice Lesch   Encounter Date: 12/18/2021   PT End of Session - 12/18/21 0936     Visit Number 6    Number of Visits 17    Date for PT Re-Evaluation 01/24/22    Authorization Type CAFA    PT Start Time 0934    PT Stop Time 1013    PT Time Calculation (min) 39 min    Equipment Utilized During Treatment Gait belt    Activity Tolerance Patient tolerated treatment well    Behavior During Therapy Impulsive             Past Medical History:  Diagnosis Date   Diabetes mellitus without complication ([place])    Seizures ([place])    TBI (traumatic brain injury) 03/2017    Past Surgical History:  Procedure Laterality Date   BIOPSY  08/19/2021   Procedure: BIOPSY;  Surgeon: Yetta Flock, MD;  Location: [facility];  Service: Gastroenterology;;   CRANIOTOMY Right 01/13/2017   Procedure: 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, Placement of bone flap in abdomen;  Surgeon: Ashok Pall, MD;  Location: [facility];  Service: Neurosurgery;  Laterality: Right;   CRANIOTOMY Right 03/19/2017   Procedure: Cranioplasty with placement of bone flap from abdomen;  Surgeon: Eustace Moore, MD;  Location: [facility];  Service: Neurosurgery;  Laterality: Right;   ESOPHAGOGASTRODUODENOSCOPY (EGD) WITH PROPOFOL N/A 08/19/2021   Procedure: ESOPHAGOGASTRODUODENOSCOPY (EGD) WITH PROPOFOL;  Surgeon: Yetta Flock, MD;  Location: [facility];  Service: Gastroenterology;  Laterality: N/A;   POLYPECTOMY  08/19/2021   Procedure: POLYPECTOMY;  Surgeon: Yetta Flock, MD;  Location: [facility];  Service: Gastroenterology;;   Azzie Almas DILATION N/A 08/19/2021   Procedure: Azzie Almas  DILATION;  Surgeon: Yetta Flock, MD;  Location: [facility];  Service: Gastroenterology;  Laterality: N/A;    There were no vitals filed for this visit.   Subjective Assessment - 12/18/21 0937     Subjective Pt denies any new issues.    Patient is accompained by: Family member;Interpreter   in person interpreter, Jyl Heinz   Pertinent History DM2, seizure, TBI 2018    Patient Stated Goals To regain balance.    Currently in Pain? No/denies                [facility] PT Assessment - 12/18/21 0937       Functional Gait  Assessment   Gait assessed  Yes    Gait Level Surface Walks 20 ft in less than 7 sec but greater than 5.5 sec, uses assistive device, slower speed, mild gait deviations, or deviates 6-10 in outside of the 12 in walkway width.    Change in Gait Speed Able to change speed, demonstrates mild gait deviations, deviates 6-10 in outside of the 12 in walkway width, or no gait deviations, unable to achieve a major change in velocity, or uses a change in velocity, or uses an assistive device.    Gait with Horizontal Head Turns Performs head turns smoothly with slight change in gait velocity (eg, minor disruption to smooth gait path), deviates 6-10 in outside 12 in walkway width, or uses an assistive device.    Gait with Vertical Head Turns Performs task with slight change in gait velocity (eg, minor  disruption to smooth gait path), deviates 6 - 10 in outside 12 in walkway width or uses assistive device    Gait and Pivot Turn Turns slowly, requires verbal cueing, or requires several small steps to catch balance following turn and stop    Step Over Obstacle Is able to step over one shoe box (4.5 in total height) but must slow down and adjust steps to clear box safely. May require verbal cueing.    Gait with Narrow Base of Support Ambulates less than 4 steps heel to toe or cannot perform without assistance.    Gait with Eyes Closed Walks 20 ft, slow speed, abnormal gait pattern,  evidence for imbalance, deviates 10-15 in outside 12 in walkway width. Requires more than 9 sec to ambulate 20 ft.    Ambulating Backwards Walks 20 ft, slow speed, abnormal gait pattern, evidence for imbalance, deviates 10-15 in outside 12 in walkway width.    Steps Alternating feet, must use rail.    Total Score 14                           [facility] Adult PT Treatment/Exercise - 12/18/21 0937       Ambulation/Gait   Ambulation/Gait Yes    Ambulation/Gait Assistance 5: Supervision    Ambulation/Gait Assistance Details around in clinic between activities    Assistive device None    Gait Pattern Step-through pattern;Decreased arm swing - right;Decreased arm swing - left;Decreased step length - right;Decreased step length - left;Wide base of support    Ambulation Surface Level;Indoor    Stairs Yes    Stairs Assistance 4: Min guard    Stair Management Technique Alternating pattern;One rail Right    Number of Stairs 4    Height of Stairs 6      Neuro Re-ed    Neuro Re-ed Details  At bottom of steps: worked on stepping up without UE support and back x 10 each side CGA with verbal cues for form. Standing with 1 foot on bottom step 30 sec each position with verbal and tactile cues for form for upright posture. Standing in staggered stance with floor dots under feet to help with positioning bending down to pick up bean bags off stool x 14 and toss in bag. Performed in each position. Then performed standing on 2 dynadiscs x 30 sec CGA/min assist with cues for upright posture and then added in tossing bean bags in to basket from table x 14 CGA/min assist.      Exercises   Exercises Other Exercises      Knee/Hip Exercises: Aerobic   Stepper SciFit level 5.5 6 min 45 sec with legs only for strengthening and activity tolerance holding 2.2# ball in hands to keep them occupied. Verbal cues to increase movement in legs to increase range. Pt reported feleing fine after.                        PT Short Term Goals - 12/18/21 1019       PT SHORT TERM GOAL #1   Title Pt/family will be IND with initial HEP in order to indicate improved functional mobility and dec fall risk. (Target Date: 12/25/21)    Time 4    Period Weeks    Status New    Target Date 12/25/21      PT SHORT TERM GOAL #2   Title Pt will complete FGA and LTG to be  added to reflect decreased fall risk.    Baseline 12/18/21 FGA=14/30    Time 4    Period Weeks    Status Achieved      PT SHORT TERM GOAL #3   Title Pt will negotiate up/down flight of stairs at distant S level using reciprocal pattern with single UE support demonstrating less need to "pull" with UE.    Time 4    Period Weeks    Status New      PT SHORT TERM GOAL #4   Title Pt will perform several gym type activities (leg press, light weights, treadmill, etc) at S level in order to indicate safe return to gym.    Time 4    Period Weeks               PT Long Term Goals - 12/18/21 1019       PT LONG TERM GOAL #1   Title Pt/sister will be IND with HEP and gym program in order to indicate improved functional mobility and dec fall risk. (LTGs due 01/24/22)    Time 8    Period Weeks    Status New      PT LONG TERM GOAL #2   Title Pt will negotiate up/down 4 stairs without rail at S level in reciprocal pattern in order to indicate improved functional strength.    Time 8    Period Weeks      PT LONG TERM GOAL #3   Title Pt will ambulate x 1000' over varying outdoor surfaces at distant S level (due to cognitive deficits) in order to indicate safety in community.    Time 8    Period Weeks    Status New      PT LONG TERM GOAL #4   Title Pt/sister will report contact with social work in order to discuss respite care/adult day care options so that sister may return to work.    Time 8    Period Weeks    Status New      PT LONG TERM GOAL #5   Title Jog  200' on grassy terrain with soccer ball without LOB to enable pt  to return to recreational activities.    Time 8    Period Weeks    Status New      Additional Long Term Goals   Additional Long Term Goals Yes      PT LONG TERM GOAL #6   Title Pt will increase FGA to >19/30 for improved balance and gait safety.    Baseline 12/18/21 14/30    Time 8    Period Weeks    Status New                   Plan - 12/18/21 1018     Clinical Impression Statement PT assessed FGA today per plan and pt is high fall risk with score of 14/30. Updated goal. Pt is high fall risk. Still challenged with maintaining attention on tasks and needs max verbal cuing.    Personal Factors and Comorbidities Behavior Pattern;Comorbidity 3+    Comorbidities DMII, seizures, TBI    Examination-Activity Limitations Stairs;Locomotion Level;Transfers;Hygiene/Grooming;Dressing;Bathing    Rehab Potential Good    PT Frequency 2x / week    PT Duration 8 weeks    PT Treatment/Interventions ADLs/Self Care Home Management;Aquatic Therapy;Gait training;Stair training;Functional mobility training;Therapeutic activities;Therapeutic exercise;Balance training;Neuromuscular re-education;Cognitive remediation;Patient/family education;Orthotic Fit/Training;Passive range of motion;Vestibular    PT Next Visit Plan STG check due  next week. add to HEP as able for BLE strengthening and balance, work on improving gait (narrow BOS, more relaxed posture), gym type activities as they have returned to gym, outdoor gait, stairs             Patient will benefit from skilled therapeutic intervention in order to improve the following deficits and impairments:     Visit Diagnosis: Other abnormalities of gait and mobility  Muscle weakness (generalized)  Unsteadiness on feet     Problem List Patient Active Problem List   Diagnosis Date Noted   Gastric polyp    Gastritis and gastroduodenitis    History of traumatic brain injury 05/29/2021   MDD (major depressive disorder), recurrent, in  partial remission ([place]) 11/12/2020   MDD (major depressive disorder), recurrent episode, moderate ([place]) 07/16/2020   Influenza vaccine needed 07/14/2019   Microalbuminuria due to type 2 diabetes mellitus ([place]) 04/11/2019   Hypothyroidism 09/21/2018   Seizure disorder ([place]) 06/16/2018   Low testosterone 06/16/2018   Traumatic brain injury with loss of consciousness ([place]) 10/29/2017   Anemia, chronic disease 10/29/2017   Positive depression screening 10/29/2017   Attention and concentration deficit    Type 2 diabetes mellitus with peripheral neuropathy ([place])    S/P craniotomy 03/19/2017   Essential hypertension    Dysphagia, oropharyngeal 02/01/2017   Diabetes type 2, uncontrolled 06/13/2015    Electa Sniff, PT, DPT, NCS 12/18/2021, 12:03 PM  [facility] [address] [place], [place], [address] Phone: [phone]   Fax:  [phone]  Name: [name] MRN: [id number] Date of Birth: 1966-05-22

## 2021-12-23 ENCOUNTER — Ambulatory Visit (HOSPITAL_COMMUNITY)
Admission: EM | Admit: 2021-12-23 | Discharge: 2021-12-23 | Disposition: A | Discharge status: Home / Self Care | Payer: No Payment, Other | Attending: Psychiatry | Admitting: Psychiatry

## 2021-12-23 ENCOUNTER — Telehealth (HOSPITAL_COMMUNITY): Payer: Self-pay | Admitting: Psychiatry

## 2021-12-23 ENCOUNTER — Ambulatory Visit: Payer: No Typology Code available for payment source | Admitting: Rehabilitation

## 2021-12-23 DIAGNOSIS — Z87820 Personal history of traumatic brain injury: Secondary | ICD-10-CM | POA: Insufficient documentation

## 2021-12-23 DIAGNOSIS — R450 Nervousness: Secondary | ICD-10-CM | POA: Insufficient documentation

## 2021-12-23 DIAGNOSIS — R4689 Other symptoms and signs involving appearance and behavior: Secondary | ICD-10-CM

## 2021-12-23 DIAGNOSIS — F32A Depression, unspecified: Secondary | ICD-10-CM | POA: Insufficient documentation

## 2021-12-23 DIAGNOSIS — G40909 Epilepsy, unspecified, not intractable, without status epilepticus: Secondary | ICD-10-CM | POA: Insufficient documentation
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
## 2021-12-23 NOTE — Telephone Encounter (Signed)
Pt sister came by to report patient is at [facility] awaiting instructions from Urgent Care provider.

## 2021-12-23 NOTE — Discharge Instructions (Signed)
Take all medications as prescribed. Keep all follow-up appointments as scheduled.  Do not consume alcohol or use illegal drugs while on prescription medications. Report any adverse effects from your medications to your primary care provider promptly.  In the event of recurrent symptoms or worsening symptoms, call 911, a crisis hotline, or go to the nearest emergency department for evaluation.   

## 2021-12-23 NOTE — Telephone Encounter (Signed)
Thank you for the update. Patient will fu with provider upon discharge from Urgent Care  clinic.

## 2021-12-23 NOTE — Telephone Encounter (Signed)
He has been scheduled for Monday, December 29, 2021, at 10:30 am.

## 2021-12-23 NOTE — ED Notes (Signed)
Pt discharged by provider with AVS in hand. Safety maintained. 
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
## 2021-12-25 ENCOUNTER — Other Ambulatory Visit: Payer: Self-pay

## 2021-12-25 ENCOUNTER — Ambulatory Visit: Payer: Self-pay | Admitting: Rehabilitation

## 2021-12-25 ENCOUNTER — Encounter: Payer: Self-pay | Admitting: Rehabilitation

## 2021-12-25 DIAGNOSIS — R293 Abnormal posture: Secondary | ICD-10-CM

## 2021-12-25 DIAGNOSIS — R2689 Other abnormalities of gait and mobility: Secondary | ICD-10-CM

## 2021-12-25 DIAGNOSIS — M6281 Muscle weakness (generalized): Secondary | ICD-10-CM

## 2021-12-25 DIAGNOSIS — R2681 Unsteadiness on feet: Secondary | ICD-10-CM

## 2021-12-25 NOTE — Therapy (Signed)
[facility] [address] [place], [place], [address] Phone: [phone]   Fax:  [phone]  Physical Therapy Treatment  Patient Details  Name: [name] MRN: [id number] Date of Birth: 1966-02-20 Referring Provider (PT): Dr. Delice Lesch   Encounter Date: 12/25/2021   PT End of Session - 12/25/21 1554     Visit Number 7    Number of Visits 17    Date for PT Re-Evaluation 01/24/22    Authorization Type CAFA    PT Start Time 0933    PT Stop Time 1015    PT Time Calculation (min) 42 min    Equipment Utilized During Treatment Gait belt    Activity Tolerance Patient tolerated treatment well    Behavior During Therapy Impulsive             Past Medical History:  Diagnosis Date   Diabetes mellitus without complication ([place])    Seizures ([place])    TBI (traumatic brain injury) 03/2017    Past Surgical History:  Procedure Laterality Date   BIOPSY  08/19/2021   Procedure: BIOPSY;  Surgeon: Yetta Flock, MD;  Location: [facility];  Service: Gastroenterology;;   CRANIOTOMY Right 01/13/2017   Procedure: 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, Placement of bone flap in abdomen;  Surgeon: Ashok Pall, MD;  Location: [facility];  Service: Neurosurgery;  Laterality: Right;   CRANIOTOMY Right 03/19/2017   Procedure: Cranioplasty with placement of bone flap from abdomen;  Surgeon: Eustace Moore, MD;  Location: [facility];  Service: Neurosurgery;  Laterality: Right;   ESOPHAGOGASTRODUODENOSCOPY (EGD) WITH PROPOFOL N/A 08/19/2021   Procedure: ESOPHAGOGASTRODUODENOSCOPY (EGD) WITH PROPOFOL;  Surgeon: Yetta Flock, MD;  [facility];  Service: Gastroenterology;  Laterality: N/A;   POLYPECTOMY  08/19/2021   Procedure: POLYPECTOMY;  Surgeon: Yetta Flock, MD;  Location[facility];  Service: Gastroenterology;;   Azzie Almas DILATION N/A 08/19/2021   Procedure: Azzie Almas  DILATION;  Surgeon: Yetta Flock, MD;  [facility];  Service: Gastroenterology;  Laterality: N/A;    There were no vitals filed for this visit.   Subjective Assessment - 12/25/21 0941     Subjective Pts sister reports that his behavior was not good on Tuesday therefore took him to behavioral health, they have an appt there again on Monday to see about adjusting meds.    Pertinent History DM2, seizure, TBI 2018    Patient Stated Goals To regain balance.    Currently in Pain? No/denies                               [facility] Adult PT Treatment/Exercise - 12/25/21 1013       Transfers   Transfers Sit to Stand;Stand to Sit    Sit to Stand 5: Supervision    Stand to Sit 5: Supervision      Ambulation/Gait   Ambulation/Gait Yes    Ambulation/Gait Assistance 4: Min guard;4: Min assist    Ambulation/Gait Assistance Details Had pt ambulate over grassy and gravel surfaces in session x 100' at close S to min/guard level with cues for larger step lengths and upright posture.  Also worked on dynamic gait with balance kicking ball x 300' with min/guard to min A at times.  Pt needing max encouragement and cues to also kick with L foot throughout.    Ambulation Distance (Feet) 300 Feet   and 100, plus 200'  indoors   Assistive device None    Gait Pattern Step-through pattern;Decreased arm swing - right;Decreased arm swing - left;Decreased step length - right;Decreased step length - left;Wide base of support    Ambulation Surface Level;Unlevel;Indoor;Outdoor;Paved;Gravel;Grass    Stairs Yes    Stairs Assistance 5: Supervision    Stairs Assistance Details (indicate cue type and reason) Pt able to perform reciprocal pattern with single UE only however needs cues to use single UE esp when descending as he seems to be trying to come down without using support which is unsafe at this time.    Stair Management Technique One rail Right;Alternating pattern;Forwards     Number of Stairs 12    Height of Stairs 6      High Level Balance   High Level Balance Comments Kicking soccer ball as mentioned in gait section for modified SLS tasks and also moving laterally.  Performed foward stepping over orange barriers (smaller ones) in grass x 8 laps, pt doing better with increased reps, esp from a cognitive standpoint in recalling how to perform task.  Attempted lateral stepping over barriers and did perform x 4 laps with HHA from PT however he tends to turn body to perform more of a forward step than lateral.  Tapping R LE to varried targets to the R x 4 (4 sets) then the L x 4 (4 sets) again doing better with repetition.  Min A throughout.  Moved to solid sidewalk and had pt alt tapping to top of barrier x 20 reps.  Note improvement in shifting to the R than previously.      Exercises   Exercises Other Exercises      Knee/Hip Exercises: Machines for Strengthening   Total Gym Leg Press 60# x 10 reps, 70# x 10 reps and 80# x 10 reps with cues for technique.  Safe to do in gym with S/set up from family.                       PT Short Term Goals - 12/25/21 0943       PT SHORT TERM GOAL #1   Title Pt/family will be IND with initial HEP in order to indicate improved functional mobility and dec fall risk. (Target Date: 12/25/21)    Baseline doing inconsistently    Time 4    Period Weeks    Status Partially Met    Target Date 12/25/21      PT SHORT TERM GOAL #2   Title Pt will complete FGA and LTG to be added to reflect decreased fall risk.    Baseline 12/18/21 FGA=14/30    Time 4    Period Weeks    Status Achieved      PT SHORT TERM GOAL #3   Title Pt will negotiate up/down flight of stairs at distant S level using reciprocal pattern with single UE support demonstrating less need to "pull" with UE.    Baseline Met with S and single UE support    Time 4    Period Weeks    Status Achieved      PT SHORT TERM GOAL #4   Title Pt will perform several  gym type activities (leg press, light weights, treadmill, etc) at S level in order to indicate safe return to gym.    Baseline Is able to do seated machines with S/set up from family    Time 4    Period Weeks    Status Achieved  PT Long Term Goals - 12/18/21 1019       PT LONG TERM GOAL #1   Title Pt/sister will be IND with HEP and gym program in order to indicate improved functional mobility and dec fall risk. (LTGs due 01/24/22)    Time 8    Period Weeks    Status New      PT LONG TERM GOAL #2   Title Pt will negotiate up/down 4 stairs without rail at S level in reciprocal pattern in order to indicate improved functional strength.    Time 8    Period Weeks      PT LONG TERM GOAL #3   Title Pt will ambulate x 1000' over varying outdoor surfaces at distant S level (due to cognitive deficits) in order to indicate safety in community.    Time 8    Period Weeks    Status New      PT LONG TERM GOAL #4   Title Pt/sister will report contact with social work in order to discuss respite care/adult day care options so that sister may return to work.    Time 8    Period Weeks    Status New      PT LONG TERM GOAL #5   Title Jog  200' on grassy terrain with soccer ball without LOB to enable pt to return to recreational activities.    Time 8    Period Weeks    Status New      Additional Long Term Goals   Additional Long Term Goals Yes      PT LONG TERM GOAL #6   Title Pt will increase FGA to >19/30 for improved balance and gait safety.    Baseline 12/18/21 14/30    Time 8    Period Weeks    Status New                   Plan - 12/25/21 1554     Clinical Impression Statement Skilled session focused on assessment of STGs.  Pt not consistently performing HEP at home and needs S and set up from family to safely do exercises at gym or soccer ball task at home.  He    Personal Factors and Comorbidities Behavior Pattern;Comorbidity 3+    Comorbidities DMII,  seizures, TBI    Examination-Activity Limitations Stairs;Locomotion Level;Transfers;Hygiene/Grooming;Dressing;Bathing    Rehab Potential Good    PT Frequency 2x / week    PT Duration 8 weeks    PT Treatment/Interventions ADLs/Self Care Home Management;Aquatic Therapy;Gait training;Stair training;Functional mobility training;Therapeutic activities;Therapeutic exercise;Balance training;Neuromuscular re-education;Cognitive remediation;Patient/family education;Orthotic Fit/Training;Passive range of motion;Vestibular    PT Next Visit Plan add to HEP as able for BLE strengthening and balance, work on improving gait (narrow BOS, more relaxed posture), gym type activities as they have returned to gym, outdoor gait, stairs    PT [place] he can work on soccer like tasks, needs up to min A, outdoor gait, any SLS tasks are good, compliant surfaces    Consulted and Agree with Plan of Care Patient;Family member/caregiver             Patient will benefit from skilled therapeutic intervention in order to improve the following deficits and impairments:     Visit Diagnosis: Other abnormalities of gait and mobility  Muscle weakness (generalized)  Unsteadiness on feet  Abnormal posture     Problem List Patient Active Problem List   Diagnosis Date Noted   Gastric polyp  Gastritis and gastroduodenitis    History of traumatic brain injury 05/29/2021   MDD (major depressive disorder), recurrent, in partial remission ([place]) 11/12/2020   MDD (major depressive disorder), recurrent episode, moderate ([place]) 07/16/2020   Influenza vaccine needed 07/14/2019   Microalbuminuria due to type 2 diabetes mellitus ([place]) 04/11/2019   Hypothyroidism 09/21/2018   Seizure disorder ([place]) 06/16/2018   Low testosterone 06/16/2018   Traumatic brain injury with loss of consciousness ([place]) 10/29/2017   Anemia, chronic disease 10/29/2017   Positive depression screening 10/29/2017   Attention and  concentration deficit    Type 2 diabetes mellitus with peripheral neuropathy ([place])    S/P craniotomy 03/19/2017   Essential hypertension    Dysphagia, oropharyngeal 02/01/2017   Diabetes type 2, uncontrolled 06/13/2015    Cameron Sprang, PT, MPT [facility] [address] [place], [place], [address] Phone: [phone]   Fax:  [phone] 12/25/21, 3:57 PM   Name: Meryl Ponder MRN: [id number] Date of Birth: April 20, 1966

## 2021-12-26 ENCOUNTER — Ambulatory Visit: Payer: No Typology Code available for payment source | Admitting: Physical Therapy

## 2021-12-26 ENCOUNTER — Encounter: Payer: Self-pay | Admitting: Physical Therapy

## 2021-12-26 DIAGNOSIS — M6281 Muscle weakness (generalized): Secondary | ICD-10-CM

## 2021-12-26 DIAGNOSIS — R2689 Other abnormalities of gait and mobility: Secondary | ICD-10-CM

## 2021-12-26 DIAGNOSIS — R2681 Unsteadiness on feet: Secondary | ICD-10-CM

## 2021-12-26 DIAGNOSIS — R293 Abnormal posture: Secondary | ICD-10-CM

## 2021-12-26 NOTE — Therapy (Signed)
[facility] [address] [place], [place], [address] Phone: [phone]   Fax:  [phone]  Physical Therapy Treatment  Patient Details  Name: [name] MRN: [id number] Date of Birth: 1966-10-10 Referring Provider (PT): Dr. Delice Lesch   Encounter Date: 12/26/2021   PT End of Session - 12/26/21 0935     Visit Number 8    Number of Visits 17    Date for PT Re-Evaluation 01/24/22    Authorization Type CAFA 10/13/22- 04/13/22    PT Start Time 0933    PT Stop Time 1013    PT Time Calculation (min) 40 min    Equipment Utilized During Treatment Gait belt    Activity Tolerance Patient tolerated treatment well    Behavior During Therapy Impulsive             Past Medical History:  Diagnosis Date   Diabetes mellitus without complication ([place])    Seizures ([place])    TBI (traumatic brain injury) 03/2017    Past Surgical History:  Procedure Laterality Date   BIOPSY  08/19/2021   Procedure: BIOPSY;  Surgeon: Yetta Flock, MD;  Location: [facility];  Service: Gastroenterology;;   CRANIOTOMY Right 01/13/2017   Procedure: 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, Placement of bone flap in abdomen;  Surgeon: Ashok Pall, MD;  Location: [facility];  Service: Neurosurgery;  Laterality: Right;   CRANIOTOMY Right 03/19/2017   Procedure: Cranioplasty with placement of bone flap from abdomen;  Surgeon: Eustace Moore, MD;  Location: [facility];  Service: Neurosurgery;  Laterality: Right;   ESOPHAGOGASTRODUODENOSCOPY (EGD) WITH PROPOFOL N/A 08/19/2021   Procedure: ESOPHAGOGASTRODUODENOSCOPY (EGD) WITH PROPOFOL;  Surgeon: Yetta Flock, MD;  [facility];  Service: Gastroenterology;  Laterality: N/A;   POLYPECTOMY  08/19/2021   Procedure: POLYPECTOMY;  Surgeon: Yetta Flock, MD;  Location: [facility];  Service: Gastroenterology;;   Azzie Almas DILATION N/A 08/19/2021    Procedure: Azzie Almas DILATION;  Surgeon: Yetta Flock, MD;  [facility];  Service: Gastroenterology;  Laterality: N/A;    There were no vitals filed for this visit.   Subjective Assessment - 12/26/21 0934     Subjective No new complaints. No falls or pain to report.    Patient is accompained by: Family member;Interpreter    Pertinent History DM2, seizure, TBI 2018    Limitations Writing;House hold activities    How long can you walk comfortably? His sister will cue him to walk outside on their patio.    Patient Stated Goals To regain balance.    Currently in Pain? No/denies                   [facility] Adult PT Treatment/Exercise - 12/26/21 0935       Transfers   Transfers Sit to Stand;Stand to Sit    Sit to Stand 5: Supervision    Stand to Sit 5: Supervision      Ambulation/Gait   Ambulation/Gait Yes    Ambulation/Gait Assistance 5: Supervision;4: Min guard;4: Min assist    Ambulation/Gait Assistance Details increased assistance with dynamic tasks. cues throughout for increased step length with gait.    Ambulation Distance (Feet) --   around clinic in session   Assistive device None    Gait Pattern Step-through pattern;Decreased arm swing - right;Decreased arm swing - left;Decreased step length - right;Decreased step length - left;Wide base of support    Ambulation Surface Level;Indoor  Neuro Re-ed    Neuro Re-ed Details  for balance/coordination/strengthening: walking while self tossing large ball with cues throughout for large steps, min guard assist for ~200 feet; with floor ladder: walking high knee marching with one foot to a square for 4 laps, then stepping pattern of out<>out<>in<>in progressing forward on ladder for 6 laps. pt needed HHA for balance with verbal/visual cues on correct technique/form (PTA in front of pt with cues "look at me, do as me"; in parallel bars with soccer ball- working on single leg stance with one foot on ball rolling  forward/backward, laterally, circles with pt holding weighted ball to occupy hands so to not hold bars. cues to slow down and keep ball closer with movements with min to mod assist. then with foot on ball in static position had pt tossing ball with sister for ~2 minutes each foot on ball, min to mod assist with cues on posture and weight shifting.      Knee/Hip Exercises: Aerobic   Stepper SciFit level 5.5 8 min with legs only for strengthening and activity tolerance holding 2.2# ball in hands to keep them occupied. Verbal cues to increase movement in legs to increase range. Pt reported feleing fine after.                       PT Short Term Goals - 12/25/21 0943       PT SHORT TERM GOAL #1   Title Pt/family will be IND with initial HEP in order to indicate improved functional mobility and dec fall risk. (Target Date: 12/25/21)    Baseline doing inconsistently    Time 4    Period Weeks    Status Partially Met    Target Date 12/25/21      PT SHORT TERM GOAL #2   Title Pt will complete FGA and LTG to be added to reflect decreased fall risk.    Baseline 12/18/21 FGA=14/30    Time 4    Period Weeks    Status Achieved      PT SHORT TERM GOAL #3   Title Pt will negotiate up/down flight of stairs at distant S level using reciprocal pattern with single UE support demonstrating less need to "pull" with UE.    Baseline Met with S and single UE support    Time 4    Period Weeks    Status Achieved      PT SHORT TERM GOAL #4   Title Pt will perform several gym type activities (leg press, light weights, treadmill, etc) at S level in order to indicate safe return to gym.    Baseline Is able to do seated machines with S/set up from family    Time 4    Period Weeks    Status Achieved               PT Long Term Goals - 12/18/21 1019       PT LONG TERM GOAL #1   Title Pt/sister will be IND with HEP and gym program in order to indicate improved functional mobility and dec  fall risk. (LTGs due 01/24/22)    Time 8    Period Weeks    Status New      PT LONG TERM GOAL #2   Title Pt will negotiate up/down 4 stairs without rail at S level in reciprocal pattern in order to indicate improved functional strength.    Time 8    Period Weeks  PT LONG TERM GOAL #3   Title Pt will ambulate x 1000' over varying outdoor surfaces at distant S level (due to cognitive deficits) in order to indicate safety in community.    Time 8    Period Weeks    Status New      PT LONG TERM GOAL #4   Title Pt/sister will report contact with social work in order to discuss respite care/adult day care options so that sister may return to work.    Time 8    Period Weeks    Status New      PT LONG TERM GOAL #5   Title Jog  200' on grassy terrain with soccer ball without LOB to enable pt to return to recreational activities.    Time 8    Period Weeks    Status New      Additional Long Term Goals   Additional Long Term Goals Yes      PT LONG TERM GOAL #6   Title Pt will increase FGA to >19/30 for improved balance and gait safety.    Baseline 12/18/21 14/30    Time 8    Period Weeks    Status New                   Plan - 12/26/21 0935     Clinical Impression Statement Today's skilled session continued to focus on strengthening and balance/coordination activites with no issues noted or reported. Pt does will with visual cues for best performance of tasks. The pt should benefit from continued PT to progress toward unmet goals.    Personal Factors and Comorbidities Behavior Pattern;Comorbidity 3+    Comorbidities DMII, seizures, TBI    Examination-Activity Limitations Stairs;Locomotion Level;Transfers;Hygiene/Grooming;Dressing;Bathing    Rehab Potential Good    PT Frequency 2x / week    PT Duration 8 weeks    PT Treatment/Interventions ADLs/Self Care Home Management;Aquatic Therapy;Gait training;Stair training;Functional mobility training;Therapeutic  activities;Therapeutic exercise;Balance training;Neuromuscular re-education;Cognitive remediation;Patient/family education;Orthotic Fit/Training;Passive range of motion;Vestibular    PT Next Visit Plan add to HEP as able for BLE strengthening and balance, work on improving gait (narrow BOS, more relaxed posture), gym type activities as they have returned to gym, outdoor gait, stairs. activities with soccer ball, floor ladder    PT Home Exercise Plan goes to gym with sister; Access Code: [email or web address]    Consulted and Agree with Plan of Care Patient;Family member/caregiver             Patient will benefit from skilled therapeutic intervention in order to improve the following deficits and impairments:     Visit Diagnosis: Other abnormalities of gait and mobility  Muscle weakness (generalized)  Unsteadiness on feet  Abnormal posture     Problem List Patient Active Problem List   Diagnosis Date Noted   Gastric polyp    Gastritis and gastroduodenitis    History of traumatic brain injury 05/29/2021   MDD (major depressive disorder), recurrent, in partial remission ([place]) 11/12/2020   MDD (major depressive disorder), recurrent episode, moderate ([place]) 07/16/2020   Influenza vaccine needed 07/14/2019   Microalbuminuria due to type 2 diabetes mellitus ([place]) 04/11/2019   Hypothyroidism 09/21/2018   Seizure disorder ([place]) 06/16/2018   Low testosterone 06/16/2018   Traumatic brain injury with loss of consciousness ([place]) 10/29/2017   Anemia, chronic disease 10/29/2017   Positive depression screening 10/29/2017   Attention and concentration deficit    Type 2 diabetes mellitus with peripheral neuropathy ([place])  S/P craniotomy 03/19/2017   Essential hypertension    Dysphagia, oropharyngeal 02/01/2017   Diabetes type 2, uncontrolled 06/13/2015    Willow Ora, PTA, [facility] [address], [place], [place] [address] [phone] 12/26/21, 3:59 PM     Name: [name] MRN: [id number] Date of Birth: [birth year]

## 2021-12-29 ENCOUNTER — Other Ambulatory Visit: Payer: Self-pay | Admitting: Internal Medicine

## 2021-12-29 ENCOUNTER — Encounter (HOSPITAL_COMMUNITY): Payer: Self-pay | Admitting: Psychiatry

## 2021-12-29 ENCOUNTER — Other Ambulatory Visit: Payer: Self-pay

## 2021-12-29 ENCOUNTER — Ambulatory Visit (INDEPENDENT_AMBULATORY_CARE_PROVIDER_SITE_OTHER): Payer: No Payment, Other | Admitting: Psychiatry

## 2021-12-29 DIAGNOSIS — F411 Generalized anxiety disorder: Secondary | ICD-10-CM | POA: Diagnosis not present

## 2021-12-29 DIAGNOSIS — E119 Type 2 diabetes mellitus without complications: Secondary | ICD-10-CM

## 2021-12-29 DIAGNOSIS — F3341 Major depressive disorder, recurrent, in partial remission: Secondary | ICD-10-CM | POA: Diagnosis not present

## 2021-12-29 MED ORDER — HYDROXYZINE HCL 10 MG PO TABS
10.0000 mg | ORAL_TABLET | Freq: Three times a day (TID) | ORAL | 3 refills | Status: AC | PRN
  Filled 2021-12-29: qty 90, 30d supply, fill #0
  Filled 2022-11-03: qty 90, 30d supply, fill #1
  Filled 2022-12-29: qty 90, 30d supply, fill #2

## 2021-12-29 MED ORDER — FLUOXETINE HCL 10 MG PO CAPS
ORAL_CAPSULE | ORAL | 3 refills | Status: DC
  Filled 2021-12-29: qty 90, 30d supply, fill #0
  Filled 2022-02-24: qty 90, 30d supply, fill #1
  Filled 2022-03-24: qty 90, 30d supply, fill #2
  Filled 2022-05-01: qty 90, 30d supply, fill #3

## 2021-12-29 MED ORDER — METFORMIN HCL 1000 MG PO TABS
1000.0000 mg | ORAL_TABLET | Freq: Two times a day (BID) | ORAL | 2 refills | Status: DC
  Filled 2021-12-29 – 2022-02-24 (×2): qty 60, 30d supply, fill #0
  Filled 2022-03-24: qty 60, 30d supply, fill #1
  Filled 2022-05-01: qty 60, 30d supply, fill #2
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
## 2021-12-30 ENCOUNTER — Ambulatory Visit: Payer: No Typology Code available for payment source | Admitting: Rehabilitation

## 2022-01-01 ENCOUNTER — Ambulatory Visit: Payer: No Typology Code available for payment source | Attending: Internal Medicine | Admitting: Rehabilitation

## 2022-01-01 ENCOUNTER — Other Ambulatory Visit: Payer: Self-pay

## 2022-01-01 ENCOUNTER — Encounter: Payer: Self-pay | Admitting: Rehabilitation

## 2022-01-01 DIAGNOSIS — R293 Abnormal posture: Secondary | ICD-10-CM

## 2022-01-01 DIAGNOSIS — M6281 Muscle weakness (generalized): Secondary | ICD-10-CM

## 2022-01-01 DIAGNOSIS — R2681 Unsteadiness on feet: Secondary | ICD-10-CM

## 2022-01-01 DIAGNOSIS — R2689 Other abnormalities of gait and mobility: Secondary | ICD-10-CM

## 2022-01-01 NOTE — Therapy (Signed)
[facility] [address] [place], [place], [address] Phone: [phone]   Fax:  [phone]  Physical Therapy Treatment  Patient Details  Name: [name] MRN: [id number] Date of Birth: Feb 05, 1966 Referring Provider (PT): Dr. Delice Lesch   Encounter Date: 01/01/2022   PT End of Session - 01/01/22 0930     Visit Number 9    Number of Visits 17    Date for PT Re-Evaluation 01/24/22    Authorization Type CAFA 10/13/22- 04/13/22    PT Start Time 0930    PT Stop Time 1015    PT Time Calculation (min) 45 min    Equipment Utilized During Treatment Gait belt    Activity Tolerance Patient tolerated treatment well    Behavior During Therapy Impulsive             Past Medical History:  Diagnosis Date   Diabetes mellitus without complication ([place])    Seizures ([place])    TBI (traumatic brain injury) 03/2017    Past Surgical History:  Procedure Laterality Date   BIOPSY  08/19/2021   Procedure: BIOPSY;  Surgeon: Yetta Flock, MD;  [facility];  Service: Gastroenterology;;   CRANIOTOMY Right 01/13/2017   Procedure: 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, Placement of bone flap in abdomen;  Surgeon: Ashok Pall, MD;  Location: [facility];  Service: Neurosurgery;  Laterality: Right;   CRANIOTOMY Right 03/19/2017   Procedure: Cranioplasty with placement of bone flap from abdomen;  Surgeon: Eustace Moore, MD;  Location: [facility];  Service: Neurosurgery;  Laterality: Right;   ESOPHAGOGASTRODUODENOSCOPY (EGD) WITH PROPOFOL N/A 08/19/2021   Procedure: ESOPHAGOGASTRODUODENOSCOPY (EGD) WITH PROPOFOL;  Surgeon: Yetta Flock, MD;  [facility];  Service: Gastroenterology;  Laterality: N/A;   POLYPECTOMY  08/19/2021   Procedure: POLYPECTOMY;  Surgeon: Yetta Flock, MD;  [facility];  Service: Gastroenterology;;   Azzie Almas DILATION N/A 08/19/2021    Procedure: Azzie Almas DILATION;  Surgeon: Yetta Flock, MD;  [facility];  Service: Gastroenterology;  Laterality: N/A;    There were no vitals filed for this visit.   Subjective Assessment - 01/01/22 0934     Subjective Pt did start new med (an antihistamine) that will help calm him down in cases of more aggressive behavior.    Pertinent History DM2, seizure, TBI 2018    How long can you walk comfortably? His sister will cue him to walk outside on their patio.    Patient Stated Goals To regain balance.    Currently in Pain? No/denies                               [facility] Adult PT Treatment/Exercise - 01/01/22 0954       Transfers   Transfers Floor to Transfer    Floor to Transfer 4: Min assist;3: Mod assist    Floor to Transfer Details (indicate cue type and reason) Performed x 5 reps for strengthening allowing pt to get to floor how he prefers and then having him come back to standing from half kneel position.  Marked difficulty when having to push up from RLE vs LLE.  However he did demo improvement with repetition.      Ambulation/Gait   Ambulation/Gait Yes    Ambulation/Gait Assistance 5: Supervision    Ambulation/Gait Assistance Details Performed 2 warm up laps prior to NMR tasks with cues for larger step  lengths and improved arm swing.  Pt initially with short shuffling steps, but improved with cues throughout.    Ambulation Distance (Feet) 230 Feet    Assistive device None    Gait Pattern Step-through pattern;Decreased arm swing - right;Decreased arm swing - left;Decreased step length - right;Decreased step length - left;Wide base of support    Ambulation Surface Level;Indoor      Neuro Re-ed    Neuro Re-ed Details  NMR in tall kneeling and quadruped for improved postural and proximal control:  Tall kneeling holding 3.3lb medicine ball moving up/down with elbows straight x 10 reps, diagonals x 10 reps each way, quadruped moving arms to targets  on floor for lateral and forward weight shifts x 10 reps (2 sets).  Progressed to just moving hands forward alt to targets due to difficulty following commands. Quadruped alt LE extension x 10 reps each side, LE ext with knee flex for more glute activation x 10 reps each side, alt UE/LE (bird dog) x 10 reps.  All exercises require multimodal tactile and verbal cuing throughout.  Continue to work on coordination and balance in standing tapping to varying targets with R LE x 5 (4 rounds) then LLE x 5 (4 rounds) to make cuing simpler.  Pt did seem to have an easier time today vs a few sessions ago doing outdoors.  Also performed standing alt LE tapping to first and second step with single UE support x 10 reps>no UE support x 10 reps with min A to min/guard A.  Still note more difficulty when in stance on RLE.                       PT Short Term Goals - 12/25/21 0943       PT SHORT TERM GOAL #1   Title Pt/family will be IND with initial HEP in order to indicate improved functional mobility and dec fall risk. (Target Date: 12/25/21)    Baseline doing inconsistently    Time 4    Period Weeks    Status Partially Met    Target Date 12/25/21      PT SHORT TERM GOAL #2   Title Pt will complete FGA and LTG to be added to reflect decreased fall risk.    Baseline 12/18/21 FGA=14/30    Time 4    Period Weeks    Status Achieved      PT SHORT TERM GOAL #3   Title Pt will negotiate up/down flight of stairs at distant S level using reciprocal pattern with single UE support demonstrating less need to "pull" with UE.    Baseline Met with S and single UE support    Time 4    Period Weeks    Status Achieved      PT SHORT TERM GOAL #4   Title Pt will perform several gym type activities (leg press, light weights, treadmill, etc) at S level in order to indicate safe return to gym.    Baseline Is able to do seated machines with S/set up from family    Time 4    Period Weeks    Status Achieved                PT Long Term Goals - 12/18/21 1019       PT LONG TERM GOAL #1   Title Pt/sister will be IND with HEP and gym program in order to indicate improved functional mobility and dec fall risk. (LTGs  due 01/24/22)    Time 8    Period Weeks    Status New      PT LONG TERM GOAL #2   Title Pt will negotiate up/down 4 stairs without rail at S level in reciprocal pattern in order to indicate improved functional strength.    Time 8    Period Weeks      PT LONG TERM GOAL #3   Title Pt will ambulate x 1000' over varying outdoor surfaces at distant S level (due to cognitive deficits) in order to indicate safety in community.    Time 8    Period Weeks    Status New      PT LONG TERM GOAL #4   Title Pt/sister will report contact with social work in order to discuss respite care/adult day care options so that sister may return to work.    Time 8    Period Weeks    Status New      PT LONG TERM GOAL #5   Title Jog  200' on grassy terrain with soccer ball without LOB to enable pt to return to recreational activities.    Time 8    Period Weeks    Status New      Additional Long Term Goals   Additional Long Term Goals Yes      PT LONG TERM GOAL #6   Title Pt will increase FGA to >19/30 for improved balance and gait safety.    Baseline 12/18/21 14/30    Time 8    Period Weeks    Status New                   Plan - 01/01/22 1112     Clinical Impression Statement Skilled session focused on proximal strengthening/control, postural control and high level balance tasks in tall kneeling, quadruped and standing.  Pt continues to need multi modal cuing due to cognitive deficits, but shows progression within tasks.    Personal Factors and Comorbidities Behavior Pattern;Comorbidity 3+    Comorbidities DMII, seizures, TBI    Examination-Activity Limitations Stairs;Locomotion Level;Transfers;Hygiene/Grooming;Dressing;Bathing    Rehab Potential Good    PT Frequency 2x / week     PT Duration 8 weeks    PT Treatment/Interventions ADLs/Self Care Home Management;Aquatic Therapy;Gait training;Stair training;Functional mobility training;Therapeutic activities;Therapeutic exercise;Balance training;Neuromuscular re-education;Cognitive remediation;Patient/family education;Orthotic Fit/Training;Passive range of motion;Vestibular    PT Next Visit Plan add to HEP as able for BLE strengthening and balance, work on improving gait (narrow BOS, more relaxed posture), gym type activities as they have returned to gym, outdoor gait, stairs. activities with soccer ball, floor ladder    PT Home Exercise Plan goes to gym with sister; Access Code: [email or web address]    Consulted and Agree with Plan of Care Patient;Family member/caregiver             Patient will benefit from skilled therapeutic intervention in order to improve the following deficits and impairments:     Visit Diagnosis: Other abnormalities of gait and mobility  Muscle weakness (generalized)  Abnormal posture  Unsteadiness on feet     Problem List Patient Active Problem List   Diagnosis Date Noted   Gastric polyp    Gastritis and gastroduodenitis    History of traumatic brain injury 05/29/2021   MDD (major depressive disorder), recurrent, in partial remission ([place]) 11/12/2020   MDD (major depressive disorder), recurrent episode, moderate ([place]) 07/16/2020   Influenza vaccine needed 07/14/2019   Microalbuminuria due to type 2  diabetes mellitus ([place]) 04/11/2019   Hypothyroidism 09/21/2018   Seizure disorder ([place]) 06/16/2018   Low testosterone 06/16/2018   Traumatic brain injury with loss of consciousness ([place]) 10/29/2017   Anemia, chronic disease 10/29/2017   Positive depression screening 10/29/2017   Attention and concentration deficit    Type 2 diabetes mellitus with peripheral neuropathy ([place])    S/P craniotomy 03/19/2017   Essential hypertension    Dysphagia, oropharyngeal 02/01/2017   Diabetes type 2,  uncontrolled 06/13/2015    Cameron Sprang, PT, MPT [facility] [address] [place], [place], [address] Phone: [phone]   Fax:  [phone] 01/01/22, 11:14 AM   Name: Abdur Hoglund MRN: [id number] Date of Birth: 04-Dec-1965

## 2022-01-02 ENCOUNTER — Ambulatory Visit: Payer: No Typology Code available for payment source | Admitting: Physical Therapy

## 2022-01-02 ENCOUNTER — Encounter: Payer: Self-pay | Admitting: Physical Therapy

## 2022-01-02 DIAGNOSIS — R293 Abnormal posture: Secondary | ICD-10-CM

## 2022-01-02 DIAGNOSIS — M6281 Muscle weakness (generalized): Secondary | ICD-10-CM

## 2022-01-02 DIAGNOSIS — R2689 Other abnormalities of gait and mobility: Secondary | ICD-10-CM

## 2022-01-02 DIAGNOSIS — R2681 Unsteadiness on feet: Secondary | ICD-10-CM

## 2022-01-02 NOTE — Therapy (Signed)
[facility] [address] [place], [place], [address] Phone: [phone]   Fax:  [phone]  Physical Therapy Treatment  Patient Details  Name: [name] MRN: [id number] Date of Birth: Aug 23, 1966 Referring Provider (PT): Dr. Delice Lesch   Encounter Date: 01/02/2022   PT End of Session - 01/02/22 0937     Visit Number 10    Number of Visits 17    Date for PT Re-Evaluation 01/24/22    Authorization Type CAFA 10/13/22- 04/13/22    PT Start Time 0933    PT Stop Time 1015    PT Time Calculation (min) 42 min    Equipment Utilized During Treatment Gait belt    Activity Tolerance Patient tolerated treatment well;No increased pain    Behavior During Therapy Impulsive             Past Medical History:  Diagnosis Date   Diabetes mellitus without complication ([place])    Seizures ([place])    TBI (traumatic brain injury) 03/2017    Past Surgical History:  Procedure Laterality Date   BIOPSY  08/19/2021   Procedure: BIOPSY;  Surgeon: Yetta Flock, MD;  Location: [facility];  Service: Gastroenterology;;   CRANIOTOMY Right 01/13/2017   Procedure: 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, Placement of bone flap in abdomen;  Surgeon: Ashok Pall, MD;  Location: [facility];  Service: Neurosurgery;  Laterality: Right;   CRANIOTOMY Right 03/19/2017   Procedure: Cranioplasty with placement of bone flap from abdomen;  Surgeon: Eustace Moore, MD;  Location: [facility];  Service: Neurosurgery;  Laterality: Right;   ESOPHAGOGASTRODUODENOSCOPY (EGD) WITH PROPOFOL N/A 08/19/2021   Procedure: ESOPHAGOGASTRODUODENOSCOPY (EGD) WITH PROPOFOL;  Surgeon: Yetta Flock, MD;  [facility];  Service: Gastroenterology;  Laterality: N/A;   POLYPECTOMY  08/19/2021   Procedure: POLYPECTOMY;  Surgeon: Yetta Flock, MD;  Location: [facility];  Service: Gastroenterology;;   Azzie Almas DILATION N/A  08/19/2021   Procedure: Azzie Almas DILATION;  Surgeon: Yetta Flock, MD;  [facility];  Service: Gastroenterology;  Laterality: N/A;    There were no vitals filed for this visit.   Subjective Assessment - 01/02/22 0936     Subjective No new complaitns. No falls or pain to report.    Patient is accompained by: Family member;Interpreter    Pertinent History DM2, seizure, TBI 2018    Limitations Writing;House hold activities    How long can you walk comfortably? His sister will cue him to walk outside on their patio.    Patient Stated Goals To regain balance.    Currently in Pain? No/denies                   [facility] Adult PT Treatment/Exercise - 01/02/22 0938       Transfers   Transfers Sit to Stand;Stand to Sit;Floor to Transfer    Sit to Stand 5: Supervision    Stand to Sit 5: Supervision    Floor to Transfer 4: Min guard;4: Min assist    Floor to Transfer Details (indicate cue type and reason) performed several times while working on activites on red mat in tall kneeling and half kneeling. pt min guard if allowed to use hands on floor (mat) to get up, if hands involved in other tasks ie holding a ball needed min<>mod assist to stand,.      Ambulation/Gait   Ambulation/Gait Yes    Ambulation/Gait Assistance 5: Supervision    Ambulation Distance (Feet) --  around clinic with session,   Assistive device None    Gait Pattern Step-through pattern;Decreased arm swing - right;Decreased arm swing - left;Decreased step length - right;Decreased step length - left;Wide base of support    Ambulation Surface Level;Indoor      Neuro Re-ed    Neuro Re-ed Details  for strenghening/muscle re-ed: on red mat in tall kneeling- with no UE support to occasional reaching toward PTA hands/shoulder (facing pt on red mat for visual cues on task) workingon mini squats x 10 reps. then lateral stepping on knee left<>right with hands clasped together xc 2 laps each way, then hands on  hips for 2 laps each way. cues to slow down and on posture/hip positioning with side stepping;            working on tasks that require push off of LE's on mat to assist with standing up from floor: in half kneeling position working on use of front LE to push up into standing. pt wanting to use hand on floor to push up. worked toward pt placing hands on knee of forward LE with out success as pt continued to reach to floor to assist with standing. then attempted to have pt hold a ball to stand from this position with pt letting go with one hand to push from floor. modified to standing and working on alternating forward stepping/lunge with bending both knees into a partial tall kneeling position (not going all the way down ) to work on the push off of the front LE for ~10 reps each side with min guard to min assist for balance; then with floor ladder: working on reciprocal stepping forward with high knee march for 4 laps, min guard assist with constant verbal/visual cues for form/technique. then working on bil LE's stepping out<>back in while walking forward along ladder. One significant balance loss with lateral stepping toward right side due to foot catching on other one, max assist to prevent fall with pt reaching to floor with right UE as he bent forward at waist, min assist to stand back up fully. performed this for 4 laps total with constant cues to task/sequencing and min guard to min assist other that above incident.      Knee/Hip Exercises: Aerobic   Stepper SciFit level 5.5 8 min with legs only for strengthening and activity tolerance holding 2.2# ball in hands to keep them occupied. had pt work on [employer] lifts for 10 reps for several sets (about 1-2 each minute).                       PT Short Term Goals - 12/25/21 0943       PT SHORT TERM GOAL #1   Title Pt/family will be IND with initial HEP in order to indicate improved functional mobility and dec fall risk. (Target Date: 12/25/21)     Baseline doing inconsistently    Time 4    Period Weeks    Status Partially Met    Target Date 12/25/21      PT SHORT TERM GOAL #2   Title Pt will complete FGA and LTG to be added to reflect decreased fall risk.    Baseline 12/18/21 FGA=14/30    Time 4    Period Weeks    Status Achieved      PT SHORT TERM GOAL #3   Title Pt will negotiate up/down flight of stairs at distant S level using reciprocal pattern with single UE support  demonstrating less need to "pull" with UE.    Baseline Met with S and single UE support    Time 4    Period Weeks    Status Achieved      PT SHORT TERM GOAL #4   Title Pt will perform several gym type activities (leg press, light weights, treadmill, etc) at S level in order to indicate safe return to gym.    Baseline Is able to do seated machines with S/set up from family    Time 4    Period Weeks    Status Achieved               PT Long Term Goals - 12/18/21 1019       PT LONG TERM GOAL #1   Title Pt/sister will be IND with HEP and gym program in order to indicate improved functional mobility and dec fall risk. (LTGs due 01/24/22)    Time 8    Period Weeks    Status New      PT LONG TERM GOAL #2   Title Pt will negotiate up/down 4 stairs without rail at S level in reciprocal pattern in order to indicate improved functional strength.    Time 8    Period Weeks      PT LONG TERM GOAL #3   Title Pt will ambulate x 1000' over varying outdoor surfaces at distant S level (due to cognitive deficits) in order to indicate safety in community.    Time 8    Period Weeks    Status New      PT LONG TERM GOAL #4   Title Pt/sister will report contact with social work in order to discuss respite care/adult day care options so that sister may return to work.    Time 8    Period Weeks    Status New      PT LONG TERM GOAL #5   Title Jog  200' on grassy terrain with soccer ball without LOB to enable pt to return to recreational activities.    Time 8     Period Weeks    Status New      Additional Long Term Goals   Additional Long Term Goals Yes      PT LONG TERM GOAL #6   Title Pt will increase FGA to >19/30 for improved balance and gait safety.    Baseline 12/18/21 14/30    Time 8    Period Weeks    Status New                   Plan - 01/02/22 4696     Clinical Impression Statement Today's skilled sesssion continued to focus on core/proximial strengthening, LE strengthening and high level balance/coordination tasks. Pt continues to need multimodal cues for tasks performed. Pt should benefit from continued PT to progress toward unmet goals.    Personal Factors and Comorbidities Behavior Pattern;Comorbidity 3+    Comorbidities DMII, seizures, TBI    Examination-Activity Limitations Stairs;Locomotion Level;Transfers;Hygiene/Grooming;Dressing;Bathing    Rehab Potential Good    PT Frequency 2x / week    PT Duration 8 weeks    PT Treatment/Interventions ADLs/Self Care Home Management;Aquatic Therapy;Gait training;Stair training;Functional mobility training;Therapeutic activities;Therapeutic exercise;Balance training;Neuromuscular re-education;Cognitive remediation;Patient/family education;Orthotic Fit/Training;Passive range of motion;Vestibular    PT Next Visit Plan add to HEP as able for BLE strengthening and balance, work on improving gait (narrow BOS, more relaxed posture), gym type activities as they have returned to gym, outdoor  gait, stairs. activities with soccer ball, floor ladder    PT Home Exercise Plan goes to gym with sister; Access Code: [email or web address]    Consulted and Agree with Plan of Care Patient;Family member/caregiver             Patient will benefit from skilled therapeutic intervention in order to improve the following deficits and impairments:     Visit Diagnosis: Other abnormalities of gait and mobility  Muscle weakness (generalized)  Abnormal posture  Unsteadiness on feet     Problem  List Patient Active Problem List   Diagnosis Date Noted   Gastric polyp    Gastritis and gastroduodenitis    History of traumatic brain injury 05/29/2021   MDD (major depressive disorder), recurrent, in partial remission ([place]) 11/12/2020   MDD (major depressive disorder), recurrent episode, moderate ([place]) 07/16/2020   Influenza vaccine needed 07/14/2019   Microalbuminuria due to type 2 diabetes mellitus ([place]) 04/11/2019   Hypothyroidism 09/21/2018   Seizure disorder ([place]) 06/16/2018   Low testosterone 06/16/2018   Traumatic brain injury with loss of consciousness ([place]) 10/29/2017   Anemia, chronic disease 10/29/2017   Positive depression screening 10/29/2017   Attention and concentration deficit    Type 2 diabetes mellitus with peripheral neuropathy ([place])    S/P craniotomy 03/19/2017   Essential hypertension    Dysphagia, oropharyngeal 02/01/2017   Diabetes type 2, uncontrolled 06/13/2015    Willow Ora, PTA, [facility]patient Neuro [facility] [address] [place], [place] [address] [phone] 01/02/22, 11:36 AM   Name: Brode Sculley MRN: [id number] Date of Birth: 1966/01/02

## 2022-01-05 ENCOUNTER — Encounter: Payer: Self-pay | Admitting: Physical Therapy

## 2022-01-05 ENCOUNTER — Ambulatory Visit: Payer: No Typology Code available for payment source | Admitting: Physical Therapy

## 2022-01-05 ENCOUNTER — Other Ambulatory Visit: Payer: Self-pay

## 2022-01-05 DIAGNOSIS — R293 Abnormal posture: Secondary | ICD-10-CM

## 2022-01-05 DIAGNOSIS — M6281 Muscle weakness (generalized): Secondary | ICD-10-CM

## 2022-01-05 DIAGNOSIS — R2689 Other abnormalities of gait and mobility: Secondary | ICD-10-CM

## 2022-01-05 NOTE — Therapy (Signed)
[facility] [address] [place], [place], [address] Phone: [phone]   Fax:  [phone]  Physical Therapy Treatment  Patient Details  Name: [name] MRN: [id number] Date of Birth: 05/15/1966 Referring Provider (PT): Dr. Delice Lesch   Encounter Date: 01/05/2022   PT End of Session - 01/05/22 1015     Visit Number 11    Number of Visits 17    Date for PT Re-Evaluation 01/24/22    Authorization Type CAFA 10/13/22- 04/13/22    PT Start Time 0930    PT Stop Time 1012    PT Time Calculation (min) 42 min    Equipment Utilized During Treatment Gait belt    Activity Tolerance Patient tolerated treatment well;No increased pain    Behavior During Therapy Impulsive             Past Medical History:  Diagnosis Date   Diabetes mellitus without complication ([place])    Seizures ([place])    TBI (traumatic brain injury) 03/2017    Past Surgical History:  Procedure Laterality Date   BIOPSY  08/19/2021   Procedure: BIOPSY;  Surgeon: Yetta Flock, MD;  [facility];  Service: Gastroenterology;;   CRANIOTOMY Right 01/13/2017   Procedure: 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, Placement of bone flap in abdomen;  Surgeon: Ashok Pall, MD;  Location: [facility];  Service: Neurosurgery;  Laterality: Right;   CRANIOTOMY Right 03/19/2017   Procedure: Cranioplasty with placement of bone flap from abdomen;  Surgeon: Eustace Moore, MD;  Location: [facility];  Service: Neurosurgery;  Laterality: Right;   ESOPHAGOGASTRODUODENOSCOPY (EGD) WITH PROPOFOL N/A 08/19/2021   Procedure: ESOPHAGOGASTRODUODENOSCOPY (EGD) WITH PROPOFOL;  Surgeon: Yetta Flock, MD;  [facility];  Service: Gastroenterology;  Laterality: N/A;   POLYPECTOMY  08/19/2021   Procedure: POLYPECTOMY;  Surgeon: Yetta Flock, MD;  Location[facility];  Service: Gastroenterology;;   Azzie Almas DILATION N/A  08/19/2021   Procedure: Azzie Almas DILATION;  Surgeon: Yetta Flock, MD;  [facility];  Service: Gastroenterology;  Laterality: N/A;    There were no vitals filed for this visit.   Subjective Assessment - 01/05/22 0933     Subjective Pt walked around the patio picking objects off the floor without balance issues. Still has trouble with initiation for tasks and independent follow through.   Patient is accompained by: Family member;Interpreter    Pertinent History DM2, seizure, TBI 2018    Limitations Writing;House hold activities    How long can you walk comfortably? His sister will cue him to walk outside on their patio.    Patient Stated Goals To regain balance.                               [facility] Adult PT Treatment/Exercise - 01/05/22 0001       Transfers   Transfers Sit to Stand;Stand to Sit    Sit to Stand 5: Supervision    Stand to Sit 5: Supervision      Ambulation/Gait   Ambulation/Gait Yes    Ambulation/Gait Assistance 5: Supervision    Ambulation/Gait Assistance Details balance on uneven surfaces working on decreasing BOS, changes in speed and direction,  and visual scanning with multitasking activities.    Ambulation Distance (Feet) 500 Feet    Assistive device None    Gait Pattern Step-through pattern;Decreased arm swing - right;Decreased arm swing - left;Decreased step length -  right;Decreased step length - left;Wide base of support    Ambulation Surface Outdoor;Paved;Gravel;Grass;Unlevel    Curb 5: Supervision   increased time needed for stepping up curb                Balance Exercises - 01/05/22 0001       Balance Exercises: Standing   Standing Eyes Opened Wide (BOA);Narrow base of support (BOS)   working on balance with decreasing BOS with tapping cones, rolling ball back and forth under foot, passing soccer ball, standing with feet close together with head turns, picking soccer ball up from ground and performing over  head passes.   Other Standing Exercises Comments all above was performed on uneven grass with cues for technique at supervision level.                  PT Short Term Goals - 12/25/21 0943       PT SHORT TERM GOAL #1   Title Pt/family will be IND with initial HEP in order to indicate improved functional mobility and dec fall risk. (Target Date: 12/25/21)    Baseline doing inconsistently    Time 4    Period Weeks    Status Partially Met    Target Date 12/25/21      PT SHORT TERM GOAL #2   Title Pt will complete FGA and LTG to be added to reflect decreased fall risk.    Baseline 12/18/21 FGA=14/30    Time 4    Period Weeks    Status Achieved      PT SHORT TERM GOAL #3   Title Pt will negotiate up/down flight of stairs at distant S level using reciprocal pattern with single UE support demonstrating less need to "pull" with UE.    Baseline Met with S and single UE support    Time 4    Period Weeks    Status Achieved      PT SHORT TERM GOAL #4   Title Pt will perform several gym type activities (leg press, light weights, treadmill, etc) at S level in order to indicate safe return to gym.    Baseline Is able to do seated machines with S/set up from family    Time 4    Period Weeks    Status Achieved               PT Long Term Goals - 12/18/21 1019       PT LONG TERM GOAL #1   Title Pt/sister will be IND with HEP and gym program in order to indicate improved functional mobility and dec fall risk. (LTGs due 01/24/22)    Time 8    Period Weeks    Status New      PT LONG TERM GOAL #2   Title Pt will negotiate up/down 4 stairs without rail at S level in reciprocal pattern in order to indicate improved functional strength.    Time 8    Period Weeks      PT LONG TERM GOAL #3   Title Pt will ambulate x 1000' over varying outdoor surfaces at distant S level (due to cognitive deficits) in order to indicate safety in community.    Time 8    Period Weeks    Status New       PT LONG TERM GOAL #4   Title Pt/sister will report contact with social work in order to discuss respite care/adult day care options so that sister may return  to work.    Time 8    Period Weeks    Status New      PT LONG TERM GOAL #5   Title Jog  200' on grassy terrain with soccer ball without LOB to enable pt to return to recreational activities.    Time 8    Period Weeks    Status New      Additional Long Term Goals   Additional Long Term Goals Yes      PT LONG TERM GOAL #6   Title Pt will increase FGA to >19/30 for improved balance and gait safety.    Baseline 12/18/21 14/30    Time 8    Period Weeks    Status New                   Plan - 01/05/22 1016     Clinical Impression Statement Progressed mobility training in outdoor grassy, uneven surface without UE support; overall pt had no major LOB and self corrected imbalance with dynamic SLS, narrow BOS, and multitasking activities.    Personal Factors and Comorbidities Behavior Pattern;Comorbidity 3+    Comorbidities DMII, seizures, TBI    Examination-Activity Limitations Stairs;Locomotion Level;Transfers;Hygiene/Grooming;Dressing;Bathing    Rehab Potential Good    PT Frequency 2x / week    PT Duration 8 weeks    PT Treatment/Interventions ADLs/Self Care Home Management;Aquatic Therapy;Gait training;Stair training;Functional mobility training;Therapeutic activities;Therapeutic exercise;Balance training;Neuromuscular re-education;Cognitive remediation;Patient/family education;Orthotic Fit/Training;Passive range of motion;Vestibular    PT Next Visit Plan add to HEP as able for BLE strengthening and balance, work on improving gait (narrow BOS, more relaxed posture), gym type activities as they have returned to gym, outdoor gait, stairs. activities with soccer ball, floor ladder    PT Home Exercise Plan goes to gym with sister; Access Code: [email or web address]    Consulted and Agree with Plan of Care Patient;Family  member/caregiver             Patient will benefit from skilled therapeutic intervention in order to improve the following deficits and impairments:     Visit Diagnosis: Other abnormalities of gait and mobility  Muscle weakness (generalized)  Abnormal posture     Problem List Patient Active Problem List   Diagnosis Date Noted   Gastric polyp    Gastritis and gastroduodenitis    History of traumatic brain injury 05/29/2021   MDD (major depressive disorder), recurrent, in partial remission ([place]) 11/12/2020   MDD (major depressive disorder), recurrent episode, moderate ([place]) 07/16/2020   Influenza vaccine needed 07/14/2019   Microalbuminuria due to type 2 diabetes mellitus ([place]) 04/11/2019   Hypothyroidism 09/21/2018   Seizure disorder ([place]) 06/16/2018   Low testosterone 06/16/2018   Traumatic brain injury with loss of consciousness ([place]) 10/29/2017   Anemia, chronic disease 10/29/2017   Positive depression screening 10/29/2017   Attention and concentration deficit    Type 2 diabetes mellitus with peripheral neuropathy ([place])    S/P craniotomy 03/19/2017   Essential hypertension    Dysphagia, oropharyngeal 02/01/2017   Diabetes type 2, uncontrolled 06/13/2015    Bjorn Loser, PTA  01/05/22, 1:16 PM   [facility] [address] [place], [place], [address] Phone: [phone]   Fax:  [phone]  Name: [name] MRN: [id number] Date of Birth: July 16, 1966

## 2022-01-07 ENCOUNTER — Encounter (HOSPITAL_COMMUNITY): Payer: No Payment, Other | Admitting: Psychiatry

## 2022-01-08 ENCOUNTER — Encounter: Payer: Self-pay | Admitting: Rehabilitation

## 2022-01-08 ENCOUNTER — Ambulatory Visit: Payer: Self-pay | Admitting: Rehabilitation

## 2022-01-08 ENCOUNTER — Other Ambulatory Visit: Payer: Self-pay

## 2022-01-08 DIAGNOSIS — R2681 Unsteadiness on feet: Secondary | ICD-10-CM

## 2022-01-08 DIAGNOSIS — R2689 Other abnormalities of gait and mobility: Secondary | ICD-10-CM

## 2022-01-08 DIAGNOSIS — R293 Abnormal posture: Secondary | ICD-10-CM

## 2022-01-08 DIAGNOSIS — M6281 Muscle weakness (generalized): Secondary | ICD-10-CM

## 2022-01-08 NOTE — Therapy (Signed)
[facility] [address] [place], [place], [address] Phone: [phone]   Fax:  [phone]  Physical Therapy Treatment  Patient Details  Name: [name] MRN: [id number] Date of Birth: 09-Jul-1966 Referring Provider (PT): Dr. Delice Lesch   Encounter Date: 01/08/2022   PT End of Session - 01/08/22 0936     Visit Number 12    Number of Visits 17    Date for PT Re-Evaluation 01/24/22    Authorization Type CAFA 10/13/22- 04/13/22    PT Start Time 0934    PT Stop Time 1015    PT Time Calculation (min) 41 min    Equipment Utilized During Treatment Gait belt    Activity Tolerance Patient tolerated treatment well;No increased pain    Behavior During Therapy Impulsive             Past Medical History:  Diagnosis Date   Diabetes mellitus without complication ([place])    Seizures ([place])    TBI (traumatic brain injury) 03/2017    Past Surgical History:  Procedure Laterality Date   BIOPSY  08/19/2021   Procedure: BIOPSY;  Surgeon: Yetta Flock, MD;  Location: [facility];  Service: Gastroenterology;;   CRANIOTOMY Right 01/13/2017   Procedure: 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, Placement of bone flap in abdomen;  Surgeon: Ashok Pall, MD;  Location: [facility];  Service: Neurosurgery;  Laterality: Right;   CRANIOTOMY Right 03/19/2017   Procedure: Cranioplasty with placement of bone flap from abdomen;  Surgeon: Eustace Moore, MD;  Location: [facility];  Service: Neurosurgery;  Laterality: Right;   ESOPHAGOGASTRODUODENOSCOPY (EGD) WITH PROPOFOL N/A 08/19/2021   Procedure: ESOPHAGOGASTRODUODENOSCOPY (EGD) WITH PROPOFOL;  Surgeon: Yetta Flock, MD;  [facility];  Service: Gastroenterology;  Laterality: N/A;   POLYPECTOMY  08/19/2021   Procedure: POLYPECTOMY;  Surgeon: Yetta Flock, MD;  [facility];  Service: Gastroenterology;;   Azzie Almas DILATION N/A  08/19/2021   Procedure: Azzie Almas DILATION;  Surgeon: Yetta Flock, MD;  [facility];  Service: Gastroenterology;  Laterality: N/A;    There were no vitals filed for this visit.   Subjective Assessment - 01/08/22 1126     Subjective Pts sister reports no changes since last session .    Patient is accompained by: Family member;Interpreter    Pertinent History DM2, seizure, TBI 2018    Limitations Writing;House hold activities    How long can you walk comfortably? His sister will cue him to walk outside on their patio.    Patient Stated Goals To regain balance.    Currently in Pain? No/denies                               [facility] Adult PT Treatment/Exercise - 01/08/22 0936       Transfers   Transfers Sit to Stand;Stand to Sit    Sit to Stand 5: Supervision    Stand to Sit 5: Supervision      Ambulation/Gait   Ambulation/Gait Yes    Ambulation/Gait Assistance 5: Supervision;4: Min guard    Ambulation/Gait Assistance Details High level balane, see below    Ambulation Distance (Feet) 250 Feet    Assistive device None    Gait Pattern Step-through pattern;Decreased arm swing - right;Decreased arm swing - left;Decreased step length - right;Decreased step length - left;Wide base of support    Ambulation Surface Level;Indoor  High Level Balance   High Level Balance Activities Backward walking;Direction changes;Turns;Sudden stops    High Level Balance Comments Performed over distance of 250' with S to min/guard for safety, esp with backwards walking.      Neuro Re-ed    Neuro Re-ed Details  Performed agility ladder and trampoline activities for BLE coordination and strength:  Started with agility ladder attempting to do quick steps forward on toes. Performed x 4 laps with continued cues and demo.  Note difficulty getting on toes on LLE.  Then moved to BLE jumping to each square again with demo and cues however note marked difficulty so went to  trampoline and perfomred jumping x 4 reps of 20 secs, then jumping with BLE add/abd x 4 sets of 20 secs (cues to move slow to focus on more ROM).  Then moved back to ladder for forward BLE jumping forward to each square.  He is able to do 3-4 with BLE lifting at same time.  Then did forward jump, lateral jump x 1 rep.  Again unable to do so had him do a step jump forward then lateral x 4 laps, all at min A level for safety, but improved within session.  Moved to performing alt step ups (rapidly) then trying to perform alt jump steps, however this was difficult from a cognitive stand point.      Knee/Hip Exercises: Aerobic   Stepper Scitfit level 4 LEs only x 6 mins, with cuing to maintain fast face throughout                       PT Short Term Goals - 12/25/21 0943       PT SHORT TERM GOAL #1   Title Pt/family will be IND with initial HEP in order to indicate improved functional mobility and dec fall risk. (Target Date: 12/25/21)    Baseline doing inconsistently    Time 4    Period Weeks    Status Partially Met    Target Date 12/25/21      PT SHORT TERM GOAL #2   Title Pt will complete FGA and LTG to be added to reflect decreased fall risk.    Baseline 12/18/21 FGA=14/30    Time 4    Period Weeks    Status Achieved      PT SHORT TERM GOAL #3   Title Pt will negotiate up/down flight of stairs at distant S level using reciprocal pattern with single UE support demonstrating less need to "pull" with UE.    Baseline Met with S and single UE support    Time 4    Period Weeks    Status Achieved      PT SHORT TERM GOAL #4   Title Pt will perform several gym type activities (leg press, light weights, treadmill, etc) at S level in order to indicate safe return to gym.    Baseline Is able to do seated machines with S/set up from family    Time 4    Period Weeks    Status Achieved               PT Long Term Goals - 12/18/21 1019       PT LONG TERM GOAL #1   Title  Pt/sister will be IND with HEP and gym program in order to indicate improved functional mobility and dec fall risk. (LTGs due 01/24/22)    Time 8    Period Weeks  Status New      PT LONG TERM GOAL #2   Title Pt will negotiate up/down 4 stairs without rail at S level in reciprocal pattern in order to indicate improved functional strength.    Time 8    Period Weeks      PT LONG TERM GOAL #3   Title Pt will ambulate x 1000' over varying outdoor surfaces at distant S level (due to cognitive deficits) in order to indicate safety in community.    Time 8    Period Weeks    Status New      PT LONG TERM GOAL #4   Title Pt/sister will report contact with social work in order to discuss respite care/adult day care options so that sister may return to work.    Time 8    Period Weeks    Status New      PT LONG TERM GOAL #5   Title Jog  200' on grassy terrain with soccer ball without LOB to enable pt to return to recreational activities.    Time 8    Period Weeks    Status New      Additional Long Term Goals   Additional Long Term Goals Yes      PT LONG TERM GOAL #6   Title Pt will increase FGA to >19/30 for improved balance and gait safety.    Baseline 12/18/21 14/30    Time 8    Period Weeks    Status New                   Plan - 01/08/22 1131     Clinical Impression Statement Skilled session focused on high level balance and coordination with use of floor ladder and trampoline.  Pt demos decreased coordination esp on LLE during tasks, but does demo improvement within session. note improved BOS with gait following NMR tasks as well.    Personal Factors and Comorbidities Behavior Pattern;Comorbidity 3+    Comorbidities DMII, seizures, TBI    Examination-Activity Limitations Stairs;Locomotion Level;Transfers;Hygiene/Grooming;Dressing;Bathing    Rehab Potential Good    PT Frequency 2x / week    PT Duration 8 weeks    PT Treatment/Interventions ADLs/Self Care Home  Management;Aquatic Therapy;Gait training;Stair training;Functional mobility training;Therapeutic activities;Therapeutic exercise;Balance training;Neuromuscular re-education;Cognitive remediation;Patient/family education;Orthotic Fit/Training;Passive range of motion;Vestibular    PT Next Visit Plan add to HEP as able for BLE strengthening and balance, work on improving gait (narrow BOS, more relaxed posture), gym type activities as they have returned to gym, outdoor gait, stairs. activities with soccer ball, floor ladder    PT Home Exercise Plan goes to gym with sister; Access Code: [email or web address]    Consulted and Agree with Plan of Care Patient;Family member/caregiver             Patient will benefit from skilled therapeutic intervention in order to improve the following deficits and impairments:     Visit Diagnosis: Other abnormalities of gait and mobility  Muscle weakness (generalized)  Abnormal posture  Unsteadiness on feet     Problem List Patient Active Problem List   Diagnosis Date Noted   Gastric polyp    Gastritis and gastroduodenitis    History of traumatic brain injury 05/29/2021   MDD (major depressive disorder), recurrent, in partial remission ([place]) 11/12/2020   MDD (major depressive disorder), recurrent episode, moderate ([place]) 07/16/2020   Influenza vaccine needed 07/14/2019   Microalbuminuria due to type 2 diabetes mellitus ([place]) 04/11/2019   Hypothyroidism 09/21/2018  Seizure disorder ([place]) 06/16/2018   Low testosterone 06/16/2018   Traumatic brain injury with loss of consciousness ([place]) 10/29/2017   Anemia, chronic disease 10/29/2017   Positive depression screening 10/29/2017   Attention and concentration deficit    Type 2 diabetes mellitus with peripheral neuropathy ([place])    S/P craniotomy 03/19/2017   Essential hypertension    Dysphagia, oropharyngeal 02/01/2017   Diabetes type 2, uncontrolled 06/13/2015    Cameron Sprang, PT, MPT [facility] [address] [place], [place], [address] Phone: [phone]   Fax:  [phone] 01/08/22, 11:34 AM   Name: [name] MRN: [id number] Date of Birth: 09/16/1966

## 2022-01-13 ENCOUNTER — Encounter: Payer: Self-pay | Admitting: Rehabilitation

## 2022-01-13 ENCOUNTER — Ambulatory Visit: Payer: Self-pay | Admitting: Rehabilitation

## 2022-01-13 ENCOUNTER — Other Ambulatory Visit: Payer: Self-pay

## 2022-01-13 DIAGNOSIS — R2681 Unsteadiness on feet: Secondary | ICD-10-CM

## 2022-01-13 DIAGNOSIS — R2689 Other abnormalities of gait and mobility: Secondary | ICD-10-CM

## 2022-01-13 DIAGNOSIS — R293 Abnormal posture: Secondary | ICD-10-CM

## 2022-01-13 DIAGNOSIS — M6281 Muscle weakness (generalized): Secondary | ICD-10-CM

## 2022-01-13 NOTE — Therapy (Signed)
[facility], [place], [address] ?Phone: [phone]   Fax:  [phone] ? ?Physical Therapy Treatment ? ?Patient Details  ?Name: [name] ?MRN: [id number] ?Date of Birth: 1966-05-07 ?Referring Provider (PT): Dr. Delice Lesch ? ? ?Encounter Date: 01/13/2022 ? ? PT End of Session - 01/13/22 0921   ? ? Visit Number 13   ? Number of Visits 17   ? Date for PT Re-Evaluation 01/24/22   ? Authorization Type CAFA 10/13/22- 04/13/22   ? PT Start Time [id number]   ? PT Stop Time 1001   ? PT Time Calculation (min) 45 min   ? Equipment Utilized During Treatment Gait belt   ? Activity Tolerance Patient tolerated treatment well;No increased pain   ? Behavior During Therapy Impulsive   ? ?  ?  ? ?  ? ? ?Past Medical History:  ?Diagnosis Date  ? Diabetes mellitus without complication ([place])   ? Seizures ([place])   ? TBI (traumatic brain injury) 03/2017  ? ? ?Past Surgical History:  ?Procedure Laterality Date  ? BIOPSY  08/19/2021  ? Procedure: BIOPSY;  Surgeon: Yetta Flock, MD;  Location: [facility];  Service: Gastroenterology;;  ? CRANIOTOMY Right 01/13/2017  ? Procedure: 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, Placement of bone flap in abdomen;  Surgeon: Ashok Pall, MD;  Location: [facility];  Service: Neurosurgery;  Laterality: Right;  ? CRANIOTOMY Right 03/19/2017  ? Procedure: Cranioplasty with placement of bone flap from abdomen;  Surgeon: Eustace Moore, MD;  Location: [facility];  Service: Neurosurgery;  Laterality: Right;  ? ESOPHAGOGASTRODUODENOSCOPY (EGD) WITH PROPOFOL N/A 08/19/2021  ? Procedure: ESOPHAGOGASTRODUODENOSCOPY (EGD) WITH PROPOFOL;  Surgeon: Yetta Flock, MD;  [facility];  Service: Gastroenterology;  Laterality: N/A;  ? POLYPECTOMY  08/19/2021  ? Procedure: POLYPECTOMY;  Surgeon: Yetta Flock, MD;  Location[facility];  Service: Gastroenterology;;  ? SAVORY DILATION N/A  08/19/2021  ? Procedure: SAVORY DILATION;  Surgeon: Yetta Flock, MD;  Location[facility];  Service: Gastroenterology;  Laterality: N/A;  ? ? ?There were no vitals filed for this visit. ? ? Subjective Assessment - 01/13/22 0922   ? ? Subjective Pts sister reports that he went walking and walked further than she wanted and had to go look for him.   ? Patient is accompained by: Family member;Interpreter   ? Pertinent History DM2, seizure, TBI 2018   ? Limitations Writing;House hold activities   ? Patient Stated Goals To regain balance.   ? Currently in Pain? No/denies   ? ?  ?  ? ?  ? ? ? ? ? ? ? ? ? ? ? ? ? ? ? ? ? ? ? ? [facility] Adult PT Treatment/Exercise - 01/13/22 0922   ? ?  ? Transfers  ? Transfers Sit to Stand;Stand to Sit   ? Sit to Stand 5: Supervision   ? Stand to Sit 5: Supervision   ?  ? Ambulation/Gait  ? Ambulation/Gait Yes   ? Ambulation/Gait Assistance 6: Modified independent (Device/Increase time)   ? Ambulation/Gait Assistance Details From task to task, no cues given   ? Ambulation Distance (Feet) 100 Feet   ? Assistive device None   ? Gait Pattern Step-through pattern;Decreased arm swing - right;Decreased arm swing - left;Decreased step length - right;Decreased step length - left;Wide base of support   ? Ambulation Surface Level;Indoor   ?  ? Neuro Re-ed   ? Neuro  Re-ed Details  Standing at // bars, performing jump steps laterally over small red/black balance beam x 20 reps, then forwards/backwards x 20 reps.  Some difficulty clearing feet when stepping backwards.  Moved to BLE jumping over yard stick forward/backwards x 20 reps then laterally x 20 reps (pt unable to clear BLE when moving laterally).  Standing on foam balance beam with feet together no UE support, EO maintaining balance x 20 secs>alt forward stepping x 10 reps, alt backwards stepping x 10 reps.  Standing on solid ground alt cone taps x 20 reps, then RLE tapping target anteriorly and laterally x 10 reps then vice versa on  LLE x 10 reps.  Attempted to progress UE support as he tends to use UE heavily to compensate for dec weight shift.  Ended session with jumping on trampoline with BLEs x 20 reps, abd/add x 20 reps, and scissors x 10 reps, all with BUE support and max cues/demo.   ?  ? Knee/Hip Exercises: Aerobic  ? Stepper Scifit level 5 with LEs only x 6 mins with cues for continued fast pace.   ? ?  ?  ? ?  ? ? ? ? ? ? ? ? ? ? ? ? PT Short Term Goals - 12/25/21 0943   ? ?  ? PT SHORT TERM GOAL #1  ? Title Pt/family will be IND with initial HEP in order to indicate improved functional mobility and dec fall risk. (Target Date: 12/25/21)   ? Baseline doing inconsistently   ? Time 4   ? Period Weeks   ? Status Partially Met   ? Target Date 12/25/21   ?  ? PT SHORT TERM GOAL #2  ? Title Pt will complete FGA and LTG to be added to reflect decreased fall risk.   ? Baseline 12/18/21 FGA=14/30   ? Time 4   ? Period Weeks   ? Status Achieved   ?  ? PT SHORT TERM GOAL #3  ? Title Pt will negotiate up/down flight of stairs at distant S level using reciprocal pattern with single UE support demonstrating less need to "pull" with UE.   ? Baseline Met with S and single UE support   ? Time 4   ? Period Weeks   ? Status Achieved   ?  ? PT SHORT TERM GOAL #4  ? Title Pt will perform several gym type activities (leg press, light weights, treadmill, etc) at S level in order to indicate safe return to gym.   ? Baseline Is able to do seated machines with S/set up from family   ? Time 4   ? Period Weeks   ? Status Achieved   ? ?  ?  ? ?  ? ? ? ? PT Long Term Goals - 12/18/21 1019   ? ?  ? PT LONG TERM GOAL #1  ? Title Pt/sister will be IND with HEP and gym program in order to indicate improved functional mobility and dec fall risk. (LTGs due 01/24/22)   ? Time 8   ? Period Weeks   ? Status New   ?  ? PT LONG TERM GOAL #2  ? Title Pt will negotiate up/down 4 stairs without rail at S level in reciprocal pattern in order to indicate improved functional  strength.   ? Time 8   ? Period Weeks   ?  ? PT LONG TERM GOAL #3  ? Title Pt will ambulate x 1000' over varying outdoor surfaces  at distant S level (due to cognitive deficits) in order to indicate safety in community.   ? Time 8   ? Period Weeks   ? Status New   ?  ? PT LONG TERM GOAL #4  ? Title Pt/sister will report contact with social work in order to discuss respite care/adult day care options so that sister may return to work.   ? Time 8   ? Period Weeks   ? Status New   ?  ? PT LONG TERM GOAL #5  ? Title Jog  200' on grassy terrain with soccer ball without LOB to enable pt to return to recreational activities.   ? Time 8   ? Period Weeks   ? Status New   ?  ? Additional Long Term Goals  ? Additional Long Term Goals Yes   ?  ? PT LONG TERM GOAL #6  ? Title Pt will increase FGA to >19/30 for improved balance and gait safety.   ? Baseline 12/18/21 14/30   ? Time 8   ? Period Weeks   ? Status New   ? ?  ?  ? ?  ? ? ? ? ? ? ? ? ? ?Patient will benefit from skilled therapeutic intervention in order to improve the following deficits and impairments:    ? ?Visit Diagnosis: ?Other abnormalities of gait and mobility ? ?Muscle weakness (generalized) ? ?Abnormal posture ? ?Unsteadiness on feet ? ? ? ? ?Problem List ?Patient Active Problem List  ? Diagnosis Date Noted  ? Gastric polyp   ? Gastritis and gastroduodenitis   ? History of traumatic brain injury 05/29/2021  ? MDD (major depressive disorder), recurrent, in partial remission ([place]) 11/12/2020  ? MDD (major depressive disorder), recurrent episode, moderate ([place]) 07/16/2020  ? Influenza vaccine needed 07/14/2019  ? Microalbuminuria due to type 2 diabetes mellitus ([place]) 04/11/2019  ? Hypothyroidism 09/21/2018  ? Seizure disorder ([place]) 06/16/2018  ? Low testosterone 06/16/2018  ? Traumatic brain injury with loss of consciousness ([place]) 10/29/2017  ? Anemia, chronic disease 10/29/2017  ? Positive depression screening 10/29/2017  ? Attention and concentration deficit   ?  Type 2 diabetes mellitus with peripheral neuropathy ([facility])   ? S/P craniotomy 03/19/2017  ? Essential hypertension   ? Dysphagia, oropharyngeal 02/01/2017  ? Diabetes type 2, uncontrolled 06/13/2015  ? ? ?Raquel Sarna

## 2022-01-15 ENCOUNTER — Ambulatory Visit: Payer: Self-pay | Admitting: Rehabilitation

## 2022-01-15 ENCOUNTER — Encounter: Payer: Self-pay | Admitting: Rehabilitation

## 2022-01-15 ENCOUNTER — Other Ambulatory Visit: Payer: Self-pay

## 2022-01-15 NOTE — Therapy (Signed)
[facility] ?[place], [place], [address] ?Phone: [phone]   Fax:  [phone] ? ?Physical Therapy Treatment ? ?Patient Details  ?Name: [name] ?MRN: [id number] ?Date of Birth: 1966/09/20 ?Referring Provider (PT): Dr. Delice Lesch ? ? ?Encounter Date: 01/15/2022 ? ? PT End of Session - 01/15/22 0934   ? ? Visit Number 14   ? Number of Visits 17   ? Date for PT Re-Evaluation 01/24/22   ? Authorization Type CAFA 10/13/22- 04/13/22   ? PT Start Time 0930   ? PT Stop Time 1015   ? PT Time Calculation (min) 45 min   ? Equipment Utilized During Treatment Gait belt   ? Activity Tolerance Patient tolerated treatment well;No increased pain   ? Behavior During Therapy Impulsive   ? ?  ?  ? ?  ? ? ?Past Medical History:  ?Diagnosis Date  ? Diabetes mellitus without complication ([place])   ? Seizures ([place])   ? TBI (traumatic brain injury) 03/2017  ? ? ?Past Surgical History:  ?Procedure Laterality Date  ? BIOPSY  08/19/2021  ? Procedure: BIOPSY;  Surgeon: Yetta Flock, MD;  Location: [facility];  Service: Gastroenterology;;  ? CRANIOTOMY Right 01/13/2017  ? Procedure: 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, Placement of bone flap in abdomen;  Surgeon: Ashok Pall, MD;  Location: [facility];  Service: Neurosurgery;  Laterality: Right;  ? CRANIOTOMY Right 03/19/2017  ? Procedure: Cranioplasty with placement of bone flap from abdomen;  Surgeon: Eustace Moore, MD;  Location: [facility];  Service: Neurosurgery;  Laterality: Right;  ? ESOPHAGOGASTRODUODENOSCOPY (EGD) WITH PROPOFOL N/A 08/19/2021  ? Procedure: ESOPHAGOGASTRODUODENOSCOPY (EGD) WITH PROPOFOL;  Surgeon: Yetta Flock, MD;  Location: [facility];  Service: Gastroenterology;  Laterality: N/A;  ? POLYPECTOMY  08/19/2021  ? Procedure: POLYPECTOMY;  Surgeon: Yetta Flock, MD;  Location: [facility];  Service: Gastroenterology;;  ? SAVORY DILATION N/A  08/19/2021  ? Procedure: SAVORY DILATION;  Surgeon: Yetta Flock, MD;  Location: [facility];  Service: Gastroenterology;  Laterality: N/A;  ? ? ?There were no vitals filed for this visit. ? ? Subjective Assessment - 01/15/22 1247   ? ? Subjective Pts sister reports no changes since last visit, no pain.   ? Patient is accompained by: Family member;Interpreter   ? Pertinent History DM2, seizure, TBI 2018   ? Limitations Writing;House hold activities   ? Patient Stated Goals To regain balance.   ? Currently in Pain? No/denies   ? ?  ?  ? ?  ? ? ? ? ? ? ? ? ? ? ? ? ? ? ? ? ? ? ? ? [facility] Adult PT Treatment/Exercise - 01/15/22 0935   ? ?  ? Transfers  ? Transfers Sit to Stand;Stand to Sit   ? Sit to Stand 5: Supervision   ? Stand to Sit 5: Supervision   ?  ? Ambulation/Gait  ? Ambulation/Gait Yes   ? Ambulation/Gait Assistance 6: Modified independent (Device/Increase time)   ? Ambulation/Gait Assistance Details From task to task within session.  Note improvement with more narrow BOS.   ? Ambulation Distance (Feet) 150 Feet   ? Assistive device None   ? Gait Pattern Step-through pattern;Decreased arm swing - right;Decreased arm swing - left;Decreased step length - right;Decreased step length - left;Wide base of support   ? Ambulation Surface Level;Indoor   ?  ? Neuro Re-ed   ? Neuro Re-ed Details  Continue  to work on high level balance and agility for coordination:  jump side steps x 50' x 4 sets, forward hopping x 50' x 4 sets, continue to work on pt coming up on toes for improved push off so tried to have pt toe walk x 50' x 5 sets, he did finally ambulate on toes x 2 sets.  Attempted skipping forward again for improved push off x 50' x 4 sets.  All tasks done with min A to min/guard for safety.  Also performed balance on compliant surfaces for more improved  midline posture and R lateral weight shift:  Standing on small rocker board with feet apart EO keeping board level x 2 sets of 20 secs (max cues initially  for keeping board level) progressing to lateral weight shifts (with return to middle).  Marked difficulty following commands initially but with continued practice, he did improve.  Then moved board biased vertically again maintaining level board x 2 sets of 20 secs, forward/backwards weight shifts with cues for SLOW controlled movement.  All rocker board activities tried to have him decreased UE support.  Tandem gait on foam balance beam with cues for slow motions without UE support.  Pt did very well with this task. Ended task with crossing midline to target followed by wide lateral step in opposite direction x 10 reps on each side.  Manual facilitation for improved trunk rotation.   ?  ? Knee/Hip Exercises: Aerobic  ? Stepper Scifit level 5.5 with LEs only x 6 mins with cues for continued fast pace.   ? ?  ?  ? ?  ? ? ? ? ? ? ? ? ? ? ? ? PT Short Term Goals - 12/25/21 0943   ? ?  ? PT SHORT TERM GOAL #1  ? Title Pt/family will be IND with initial HEP in order to indicate improved functional mobility and dec fall risk. (Target Date: 12/25/21)   ? Baseline doing inconsistently   ? Time 4   ? Period Weeks   ? Status Partially Met   ? Target Date 12/25/21   ?  ? PT SHORT TERM GOAL #2  ? Title Pt will complete FGA and LTG to be added to reflect decreased fall risk.   ? Baseline 12/18/21 FGA=14/30   ? Time 4   ? Period Weeks   ? Status Achieved   ?  ? PT SHORT TERM GOAL #3  ? Title Pt will negotiate up/down flight of stairs at distant S level using reciprocal pattern with single UE support demonstrating less need to "pull" with UE.   ? Baseline Met with S and single UE support   ? Time 4   ? Period Weeks   ? Status Achieved   ?  ? PT SHORT TERM GOAL #4  ? Title Pt will perform several gym type activities (leg press, light weights, treadmill, etc) at S level in order to indicate safe return to gym.   ? Baseline Is able to do seated machines with S/set up from family   ? Time 4   ? Period Weeks   ? Status Achieved   ? ?  ?   ? ?  ? ? ? ? PT Long Term Goals - 12/18/21 1019   ? ?  ? PT LONG TERM GOAL #1  ? Title Pt/sister will be IND with HEP and gym program in order to indicate improved functional mobility and dec fall risk. (LTGs due 01/24/22)   ? Time  8   ? Period Weeks   ? Status New   ?  ? PT LONG TERM GOAL #2  ? Title Pt will negotiate up/down 4 stairs without rail at S level in reciprocal pattern in order to indicate improved functional strength.   ? Time 8   ? Period Weeks   ?  ? PT LONG TERM GOAL #3  ? Title Pt will ambulate x 1000' over varying outdoor surfaces at distant S level (due to cognitive deficits) in order to indicate safety in community.   ? Time 8   ? Period Weeks   ? Status New   ?  ? PT LONG TERM GOAL #4  ? Title Pt/sister will report contact with social work in order to discuss respite care/adult day care options so that sister may return to work.   ? Time 8   ? Period Weeks   ? Status New   ?  ? PT LONG TERM GOAL #5  ? Title Jog  200' on grassy terrain with soccer ball without LOB to enable pt to return to recreational activities.   ? Time 8   ? Period Weeks   ? Status New   ?  ? Additional Long Term Goals  ? Additional Long Term Goals Yes   ?  ? PT LONG TERM GOAL #6  ? Title Pt will increase FGA to >19/30 for improved balance and gait safety.   ? Baseline 12/18/21 14/30   ? Time 8   ? Period Weeks   ? Status New   ? ?  ?  ? ?  ? ? ? ? ? ? ? ? Plan - 01/15/22 1249   ? ? Clinical Impression Statement Continued to focus on high level balance and coordination tasks with dynamic gait activities, compliant surfaces with heavy emphasis of BLE plantar flexion for improved push off during gait.  Pt continues to be limited by poor cognition, but attempts every task given.   ? Personal Factors and Comorbidities Behavior Pattern;Comorbidity 3+   ? Comorbidities DMII, seizures, TBI   ? Examination-Activity Limitations Stairs;Locomotion Level;Transfers;Hygiene/Grooming;Dressing;Bathing   ? Rehab Potential Good   ? PT Frequency  2x / week   ? PT Duration 8 weeks   ? PT Treatment/Interventions ADLs/Self Care Home Management;Aquatic Therapy;Gait training;Stair training;Functional mobility training;Therapeutic activities;Therap

## 2022-01-27 ENCOUNTER — Ambulatory Visit: Payer: Self-pay

## 2022-01-27 ENCOUNTER — Other Ambulatory Visit: Payer: Self-pay

## 2022-01-27 DIAGNOSIS — R2681 Unsteadiness on feet: Secondary | ICD-10-CM

## 2022-01-27 DIAGNOSIS — R2689 Other abnormalities of gait and mobility: Secondary | ICD-10-CM

## 2022-01-28 ENCOUNTER — Ambulatory Visit: Payer: No Typology Code available for payment source | Admitting: Rehabilitation

## 2022-01-28 NOTE — Therapy (Signed)
[facility] ?[place], [place], [address] ?Phone: [phone]   Fax:  [phone] ? ?Physical Therapy Treatment/Discharge ? ?Patient Details  ?Name: [name] ?MRN: [id number] ?Date of Birth: 02/19/1966 ?Referring Provider (PT): Dr. Delice Lesch ?PHYSICAL THERAPY DISCHARGE SUMMARY ? ?Visits from Start of Care: 15 ? ?Current functional level related to goals / functional outcomes: ?See clinical impression and goals for more information. ?  ?Remaining deficits: ?Balance, cognition ?  ?Education / Equipment: ?HEP. Is at supervision level due to cognition.  ? ?Patient agrees to discharge. Patient goals were partially met. Patient is being discharged due to  plateau in progress as will be as supervision level due to cognition. ? ? ?Encounter Date: 01/27/2022 ? ? PT End of Session - 01/27/22 1453   ? ? Visit Number 15   ? Number of Visits 17   ? Date for PT Re-Evaluation 01/24/22   ? Authorization Type CAFA 10/13/22- 04/13/22   ? PT Start Time 1450   ? PT Stop Time 1526   discharge visit  ? PT Time Calculation (min) 36 min   ? Equipment Utilized During Treatment Gait belt   ? Activity Tolerance Patient tolerated treatment well;No increased pain   ? Behavior During Therapy Impulsive   ? ?  ?  ? ?  ? ? ?Past Medical History:  ?Diagnosis Date  ? Diabetes mellitus without complication ([place])   ? Seizures ([place])   ? TBI (traumatic brain injury) 03/2017  ? ? ?Past Surgical History:  ?Procedure Laterality Date  ? BIOPSY  08/19/2021  ? Procedure: BIOPSY;  Surgeon: Yetta Flock, MD;  Location: [facility];  Service: Gastroenterology;;  ? CRANIOTOMY Right 01/13/2017  ? Procedure: 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, Placement of bone flap in abdomen;  Surgeon: Ashok Pall, MD;  Location: [facility];  Service: Neurosurgery;  Laterality: Right;  ? CRANIOTOMY Right 03/19/2017  ? Procedure: Cranioplasty with placement of bone  flap from abdomen;  Surgeon: Eustace Moore, MD;  Location: [facility];  Service: Neurosurgery;  Laterality: Right;  ? ESOPHAGOGASTRODUODENOSCOPY (EGD) WITH PROPOFOL N/A 08/19/2021  ? Procedure: ESOPHAGOGASTRODUODENOSCOPY (EGD) WITH PROPOFOL;  Surgeon: Yetta Flock, MD;  Location: [facility];  Service: Gastroenterology;  Laterality: N/A;  ? POLYPECTOMY  08/19/2021  ? Procedure: POLYPECTOMY;  Surgeon: Yetta Flock, MD;  Location: [facility];  Service: Gastroenterology;;  ? SAVORY DILATION N/A 08/19/2021  ? Procedure: SAVORY DILATION;  Surgeon: Yetta Flock, MD;  Location: [facility];  Service: Gastroenterology;  Laterality: N/A;  ? ? ?There were no vitals filed for this visit. ? ? Subjective Assessment - 01/27/22 1453   ? ? Subjective Pt's sister wondering why he is finishing today if have one more visit on the schedule. She reports that he had 3 times where he wandered away from home quite a way away and they almost had to call the police but someone they knew saw him and let them know.   ? Patient is accompained by: Family member;Interpreter   in person interpreter, Jyl Heinz  ? Pertinent History DM2, seizure, TBI 2018   ? Limitations Writing;House hold activities   ? Patient Stated Goals To regain balance.   ? Currently in Pain? No/denies   ? ?  ?  ? ?  ? ? ? ? ? [facility] PT Assessment - 01/27/22 1458   ? ?  ? Functional Gait  Assessment  ? Gait assessed  Yes   ? Gait  Level Surface Walks 20 ft in less than 7 sec but greater than 5.5 sec, uses assistive device, slower speed, mild gait deviations, or deviates 6-10 in outside of the 12 in walkway width.   ? Change in Gait Speed Able to change speed, demonstrates mild gait deviations, deviates 6-10 in outside of the 12 in walkway width, or no gait deviations, unable to achieve a major change in velocity, or uses a change in velocity, or uses an assistive device.   ? Gait with Horizontal Head Turns Performs head turns smoothly with slight change in  gait velocity (eg, minor disruption to smooth gait path), deviates 6-10 in outside 12 in walkway width, or uses an assistive device.   ? Gait with Vertical Head Turns Performs head turns with no change in gait. Deviates no more than 6 in outside 12 in walkway width.   ? Gait and Pivot Turn Pivot turns safely within 3 sec and stops quickly with no loss of balance.   ? Step Over Obstacle Is able to step over one shoe box (4.5 in total height) without changing gait speed. No evidence of imbalance.   ? Gait with Narrow Base of Support Ambulates less than 4 steps heel to toe or cannot perform without assistance.   ? Gait with Eyes Closed Walks 20 ft, uses assistive device, slower speed, mild gait deviations, deviates 6-10 in outside 12 in walkway width. Ambulates 20 ft in less than 9 sec but greater than 7 sec.   ? Ambulating Backwards Walks 20 ft, no assistive devices, good speed, no evidence for imbalance, normal gait   ? Steps Alternating feet, must use rail.   ? Total Score 21   ? ?  ?  ? ?  ? ? ? ? ? ? ? ? ? ? ? ? ? ? ? ? [facility] Adult PT Treatment/Exercise - 01/27/22 1458   ? ?  ? Ambulation/Gait  ? Ambulation/Gait Yes   ? Ambulation/Gait Assistance 5: Supervision   ? Ambulation/Gait Assistance Details Verbal cues to increase foot clearance to not scuff feet.   ? Ambulation Distance (Feet) 1000 Feet   ? Assistive device None   ? Gait Pattern Step-through pattern;Decreased step length - right;Decreased step length - left;Decreased arm swing - right;Decreased arm swing - left;Wide base of support   ? Ambulation Surface Level;Unlevel;Indoor;Outdoor;Paved;Grass   ? Stairs Yes   ? Stairs Assistance 5: Supervision   ? Stairs Assistance Details (indicate cue type and reason) reciprocal up and step-to down with needing rails with descent   ? Stair Management Technique No rails;Two rails   ? Number of Stairs 4   ? Height of Stairs 6   ? ?  ?  ? ?  ? ? ? ? ? ? ? ? ? ? PT Education - 01/28/22 1346   ? ? Education Details  Discussed d/c plan for today and results of testing with pt and sister. Education on ways to monitor pt since he has started wandering from home some. He is able to get out locks so that won't help. Suggested some sort of camera or using the [employer] app as he does carry a phone and they can track him. The app will also notify if you leave home. Explanation that pt is at supervision level due to cognition and will continue to be so.   ? Person(s) Educated Patient;Other (comment)   sister  ? Methods Explanation   ? Comprehension Verbalized understanding   ? ?  ?  ? ?  ? ? ?  PT Short Term Goals - 12/25/21 0943   ? ?  ? PT SHORT TERM GOAL #1  ? Title Pt/family will be IND with initial HEP in order to indicate improved functional mobility and dec fall risk. (Target Date: 12/25/21)   ? Baseline doing inconsistently   ? Time 4   ? Period Weeks   ? Status Partially Met   ? Target Date 12/25/21   ?  ? PT SHORT TERM GOAL #2  ? Title Pt will complete FGA and LTG to be added to reflect decreased fall risk.   ? Baseline 12/18/21 FGA=14/30   ? Time 4   ? Period Weeks   ? Status Achieved   ?  ? PT SHORT TERM GOAL #3  ? Title Pt will negotiate up/down flight of stairs at distant S level using reciprocal pattern with single UE support demonstrating less need to "pull" with UE.   ? Baseline Met with S and single UE support   ? Time 4   ? Period Weeks   ? Status Achieved   ?  ? PT SHORT TERM GOAL #4  ? Title Pt will perform several gym type activities (leg press, light weights, treadmill, etc) at S level in order to indicate safe return to gym.   ? Baseline Is able to do seated machines with S/set up from family   ? Time 4   ? Period Weeks   ? Status Achieved   ? ?  ?  ? ?  ? ? ? ? PT Long Term Goals - 01/27/22 1502   ? ?  ? PT LONG TERM GOAL #1  ? Title Pt/sister will be IND with HEP and gym program in order to indicate improved functional mobility and dec fall risk. (LTGs due 01/24/22)   ? Baseline 01/27/22 Pt's sister says that they  do take him to the gym. He does do the recumbant bike there but hard to keep him on track. She feels that the balance exercises have helped a lot.Still needs supervision with all activities due to cognition

## 2022-02-02 ENCOUNTER — Ambulatory Visit: Payer: No Typology Code available for payment source | Admitting: Podiatry

## 2022-02-04 ENCOUNTER — Ambulatory Visit (INDEPENDENT_AMBULATORY_CARE_PROVIDER_SITE_OTHER): Payer: No Typology Code available for payment source | Admitting: Podiatry

## 2022-02-04 DIAGNOSIS — M79675 Pain in left toe(s): Secondary | ICD-10-CM

## 2022-02-04 DIAGNOSIS — B351 Tinea unguium: Secondary | ICD-10-CM

## 2022-02-04 DIAGNOSIS — M79674 Pain in right toe(s): Secondary | ICD-10-CM

## 2022-02-18 NOTE — Progress Notes (Signed)
? ?  SUBJECTIVE ?Patient with a history of diabetes mellitus presents to office today complaining of elongated, thickened nails that cause pain while ambulating in shoes.  Patient is unable to trim their own nails. Patient is here for further evaluation and treatment. ? ? ?Past Medical History:  ?Diagnosis Date  ? Diabetes mellitus without complication ([place])   ? Seizures ([place])   ? TBI (traumatic brain injury) 03/2017  ? ? ?OBJECTIVE ?General Patient is awake, alert, and oriented x 3 and in no acute distress. ?Derm Skin is dry and supple bilateral. Negative open lesions or macerations. Remaining integument unremarkable. Nails are tender, long, thickened and dystrophic with subungual debris, consistent with onychomycosis, 1-5 bilateral. No signs of infection noted. ?Vasc  DP and PT pedal pulses palpable bilaterally. Temperature gradient within normal limits.  ?Neuro Epicritic and protective threshold sensation diminished bilaterally.  ?Musculoskeletal Exam No symptomatic pedal deformities noted bilateral. Muscular strength within normal limits. ? ?ASSESSMENT ?1. Diabetes Mellitus w/ peripheral neuropathy ?2.  Pain due to onychomycosis of toenails bilateral ? ?PLAN OF CARE ?1. Patient evaluated today. ?2. Instructed to maintain good pedal hygiene and foot care. Stressed importance of controlling blood sugar.  ?3. Mechanical debridement of nails 1-5 bilaterally performed using a nail nipper. Filed with dremel without incident.  ?4. Return to clinic in 3 mos.  ? ? ? ?Edrick Kins, DPM ?[facility] ? ?Dr. Edrick Kins, DPM  ?  ?[address]                                      ?[place], [place] [address]                ?Office [phone]  ?Fax [phone] ? ? ? ? ? ?

## 2022-02-24 ENCOUNTER — Other Ambulatory Visit: Payer: Self-pay

## 2022-02-24 ENCOUNTER — Other Ambulatory Visit: Payer: Self-pay | Admitting: Internal Medicine

## 2022-02-25 ENCOUNTER — Other Ambulatory Visit: Payer: Self-pay

## 2022-02-25 MED ORDER — ATORVASTATIN CALCIUM 10 MG PO TABS
10.0000 mg | ORAL_TABLET | Freq: Every day | ORAL | 0 refills | Status: DC
  Filled 2022-02-25 – 2022-05-01 (×2): qty 30, 30d supply, fill #0

## 2022-03-04 ENCOUNTER — Other Ambulatory Visit: Payer: Self-pay

## 2022-03-12 ENCOUNTER — Encounter (HOSPITAL_COMMUNITY): Payer: No Payment, Other | Admitting: Psychiatry

## 2022-03-24 ENCOUNTER — Other Ambulatory Visit: Payer: Self-pay

## 2022-03-26 ENCOUNTER — Emergency Department (HOSPITAL_COMMUNITY)
Admission: EM | Admit: 2022-03-26 | Discharge: 2022-03-27 | Disposition: A | Discharge status: Home / Self Care | Payer: No Typology Code available for payment source | Attending: Emergency Medicine | Admitting: Emergency Medicine

## 2022-03-26 ENCOUNTER — Encounter (HOSPITAL_COMMUNITY): Payer: Self-pay | Admitting: Emergency Medicine

## 2022-03-26 ENCOUNTER — Emergency Department (HOSPITAL_COMMUNITY): Payer: No Typology Code available for payment source

## 2022-03-26 DIAGNOSIS — E119 Type 2 diabetes mellitus without complications: Secondary | ICD-10-CM | POA: Insufficient documentation

## 2022-03-26 DIAGNOSIS — I10 Essential (primary) hypertension: Secondary | ICD-10-CM | POA: Insufficient documentation

## 2022-03-26 DIAGNOSIS — Z79899 Other long term (current) drug therapy: Secondary | ICD-10-CM | POA: Insufficient documentation

## 2022-03-26 DIAGNOSIS — D649 Anemia, unspecified: Secondary | ICD-10-CM | POA: Insufficient documentation

## 2022-03-26 DIAGNOSIS — Z7984 Long term (current) use of oral hypoglycemic drugs: Secondary | ICD-10-CM | POA: Insufficient documentation

## 2022-03-26 DIAGNOSIS — Z91040 Latex allergy status: Secondary | ICD-10-CM | POA: Insufficient documentation

## 2022-03-26 DIAGNOSIS — F919 Conduct disorder, unspecified: Secondary | ICD-10-CM | POA: Insufficient documentation

## 2022-03-26 DIAGNOSIS — R4182 Altered mental status, unspecified: Secondary | ICD-10-CM | POA: Insufficient documentation

## 2022-03-26 LAB — URINALYSIS, ROUTINE W REFLEX MICROSCOPIC
Bacteria, UA: NONE SEEN
Bilirubin Urine: NEGATIVE
Glucose, UA: NEGATIVE mg/dL
Hgb urine dipstick: NEGATIVE
Ketones, ur: NEGATIVE mg/dL
Leukocytes,Ua: NEGATIVE
Nitrite: NEGATIVE
Protein, ur: 30 mg/dL — AB
Specific Gravity, Urine: 1.019 (ref 1.005–1.030)
pH: 5 (ref 5.0–8.0)

## 2022-03-26 LAB — CBC WITH DIFFERENTIAL/PLATELET
Abs Immature Granulocytes: 0.01 10*3/uL (ref 0.00–0.07)
Basophils Absolute: 0 10*3/uL (ref 0.0–0.1)
Basophils Relative: 0 %
Eosinophils Absolute: 0.1 10*3/uL (ref 0.0–0.5)
Eosinophils Relative: 1 %
HCT: 32 % — ABNORMAL LOW (ref 39.0–52.0)
Hemoglobin: 11.3 g/dL — ABNORMAL LOW (ref 13.0–17.0)
Immature Granulocytes: 0 %
Lymphocytes Relative: 28 %
Lymphs Abs: 1.3 10*3/uL (ref 0.7–4.0)
MCH: 31.4 pg (ref 26.0–34.0)
MCHC: 35.3 g/dL (ref 30.0–36.0)
MCV: 88.9 fL (ref 80.0–100.0)
Monocytes Absolute: 0.4 10*3/uL (ref 0.1–1.0)
Monocytes Relative: 8 %
Neutro Abs: 3 10*3/uL (ref 1.7–7.7)
Neutrophils Relative %: 63 %
Platelets: 221 10*3/uL (ref 150–400)
RBC: 3.6 MIL/uL — ABNORMAL LOW (ref 4.22–5.81)
RDW: 11.9 % (ref 11.5–15.5)
WBC: 4.8 10*3/uL (ref 4.0–10.5)
nRBC: 0 % (ref 0.0–0.2)

## 2022-03-26 LAB — COMPREHENSIVE METABOLIC PANEL
ALT: 22 U/L (ref 0–44)
AST: 19 U/L (ref 15–41)
Albumin: 4.1 g/dL (ref 3.5–5.0)
Alkaline Phosphatase: 42 U/L (ref 38–126)
Anion gap: 4 — ABNORMAL LOW (ref 5–15)
BUN: 32 mg/dL — ABNORMAL HIGH (ref 6–20)
CO2: 28 mmol/L (ref 22–32)
Calcium: 9.1 mg/dL (ref 8.9–10.3)
Chloride: 109 mmol/L (ref 98–111)
Creatinine, Ser: 1.08 mg/dL (ref 0.61–1.24)
GFR, Estimated: >60 mL/min (ref >60)
Glucose, Bld: 133 mg/dL — ABNORMAL HIGH (ref 70–99)
Potassium: 3.8 mmol/L (ref 3.5–5.1)
Sodium: 141 mmol/L (ref 135–145)
Total Bilirubin: 0.5 mg/dL (ref 0.3–1.2)
Total Protein: 7.2 g/dL (ref 6.5–8.1)

## 2022-03-26 LAB — RAPID URINE DRUG SCREEN, HOSP PERFORMED
Amphetamines: NOT DETECTED
Barbiturates: NOT DETECTED
Benzodiazepines: NOT DETECTED
Cocaine: NOT DETECTED
Opiates: NOT DETECTED
Tetrahydrocannabinol: NOT DETECTED

## 2022-03-26 NOTE — ED Provider Notes (Signed)
[facility]-EMERGENCY DEPT Provider Note   CSN: [id number] Arrival date & time: 03/26/22  2119     History  Chief Complaint  Patient presents with   Altered Mental Status    [name] is a 56 y.o. male. With past medical history of T2DM, seizures, TBI, HTN who presents to the emergency department with altered mental status.   Professional interpreter utilized during interview. Sister present. She provides the history. States that over the past three days patient has been acting very aggressive and bizarre. She states that this has happened previously but only briefly during a single day and not ongoing for multiple days. She states that he has been threatening to hurt her and wishing she were dead. She states that he has also not been wanting to take his medication. States he is normally oriented to self and year, but has been more altered. Denies any recent falls or head injury. Denies any fevers, nausea or vomiting or urinary symptoms. Patient denies any pain. States that he did have one episode of diarrhea yesterday but has other wise been eating and drinking.  Has not had a seizure since last October.  He is followed by Dr. Aquino.  Also followed by psychiatry.  On chart review it appears that he has had aggressive behaviors before and threatening behaviors toward his sister.  There is documented caregiver fatigue.   Altered Mental Status Presenting symptoms: confusion   Associated symptoms: agitation       Home Medications Prior to Admission medications   Medication Sig Start Date End Date Taking? Authorizing Provider  atorvastatin (LIPITOR) 10 MG tablet Take 1 tablet (10 mg total) by mouth daily. Must have office visit for refills 02/25/22   Johnson, Deborah B, MD  Blood Glucose Monitoring Suppl (TRUE METRIX METER) w/Device KIT Use as directed 04/19/17   Johnson, Deborah B, MD  cetirizine (ZYRTEC) 10 MG tablet Take 1 tablet (10 mg total) by mouth  daily. 08/30/19   Swartz, Zachary T, MD  FLUoxetine (PROZAC) 10 MG capsule TAKE 3 CAPSULES DAILY (30 MG). 12/29/21   Parsons, Brittney E, NP  glucose blood test strip Use as instructed to check blood sugar once daily. 07/04/21   Johnson, Deborah B, MD  hydrOXYzine (ATARAX) 10 MG tablet Take 1 tablet (10 mg total) by mouth 3 (three) times daily as needed. 12/29/21   Parsons, Brittney E, NP  levETIRAcetam (KEPPRA) 500 MG tablet TAKE 1.5 TABLETS TWICE A DAY 11/14/21 11/14/22  Aquino, Karen M, MD  lisinopril (ZESTRIL) 2.5 MG tablet TAKE 1 TABLET (2.5 MG TOTAL) BY MOUTH DAILY. 09/23/21 09/23/22  Johnson, Deborah B, MD  metFORMIN (GLUCOPHAGE) 1000 MG tablet Take 1 tablet (1,000 mg total) by mouth 2 (two) times daily with a meal. 12/29/21 04/25/22  Johnson, Deborah B, MD  TRUEPLUS LANCETS 28G MISC Use as directed 04/19/17   Johnson, Deborah B, MD      Allergies    Latex    Review of Systems   Review of Systems  Psychiatric/Behavioral:  Positive for agitation, behavioral problems and confusion.   All other systems reviewed and are negative.  Physical Exam Updated Vital Signs BP 125/70 (BP Location: Left Arm)   Pulse 74   Temp 98.1 F (36.7 C) (Oral)   Resp 16   SpO2 100%  Physical Exam Vitals and nursing note reviewed.  Constitutional:      General: He is not in acute distress.    Appearance: Normal appearance. He is not   ill-appearing or toxic-appearing.  HENT:     Head: Normocephalic and atraumatic.     Nose: Nose normal.     Mouth/Throat:     Mouth: Mucous membranes are moist.     Pharynx: Oropharynx is clear.     Comments: Poor dentition  Eyes:     General: No scleral icterus.    Extraocular Movements: Extraocular movements intact.     Pupils: Pupils are equal, round, and reactive to light.  Cardiovascular:     Rate and Rhythm: Normal rate and regular rhythm.     Pulses: Normal pulses.     Heart sounds: No murmur heard. Pulmonary:     Effort: Pulmonary effort is normal. No  respiratory distress.     Breath sounds: Normal breath sounds.  Abdominal:     General: Bowel sounds are normal. There is no distension.     Palpations: Abdomen is soft.     Tenderness: There is no abdominal tenderness.  Musculoskeletal:        General: Normal range of motion.     Cervical back: Normal range of motion and neck supple.  Skin:    General: Skin is warm and dry.     Capillary Refill: Capillary refill takes less than 2 seconds.     Findings: No bruising.  Neurological:     General: No focal deficit present.     Mental Status: He is alert. Mental status is at baseline. He is disoriented.     Cranial Nerves: No cranial nerve deficit.     Motor: Weakness present.     Comments: Left sided weakness - sister states this is baseline   Psychiatric:        Speech: Speech normal.        Behavior: Behavior is not agitated. Behavior is cooperative.        Cognition and Memory: Cognition is impaired. He exhibits impaired recent memory and impaired remote memory.     Comments: Does not appear to be responding to internal stimuli    ED Results / Procedures / Treatments   Labs (all labs ordered are listed, but only abnormal results are displayed) Labs Reviewed  URINALYSIS, ROUTINE W REFLEX MICROSCOPIC - Abnormal; Notable for the following components:      Result Value   Protein, ur 30 (*)    All other components within normal limits  COMPREHENSIVE METABOLIC PANEL - Abnormal; Notable for the following components:   Glucose, Bld 133 (*)    BUN 32 (*)    Anion gap 4 (*)    All other components within normal limits  CBC WITH DIFFERENTIAL/PLATELET - Abnormal; Notable for the following components:   RBC 3.60 (*)    Hemoglobin 11.3 (*)    HCT 32.0 (*)    All other components within normal limits  RAPID URINE DRUG SCREEN, HOSP PERFORMED   EKG None  Radiology CT Head Wo Contrast  Result Date: 03/26/2022 CLINICAL DATA:  Mental status change, unknown cause EXAM: CT HEAD WITHOUT  CONTRAST TECHNIQUE: Contiguous axial images were obtained from the base of the skull through the vertex without intravenous contrast. RADIATION DOSE REDUCTION: This exam was performed according to the departmental dose-optimization program which includes automated exposure control, adjustment of the mA and/or kV according to patient size and/or use of iterative reconstruction technique. COMPARISON:  10/09/2021 FINDINGS: Brain: Encephalomalacia within the right frontal and temporal lobes unchanged since prior study. There is atrophy and chronic small vessel disease changes. No acute intracranial abnormality.  Specifically, no hemorrhage, hydrocephalus, mass lesion, acute infarction, or significant intracranial injury. Vascular: No hyperdense vessel or unexpected calcification. Skull: No acute calvarial abnormality. Sinuses/Orbits: No acute findings Other: None IMPRESSION: Atrophy, chronic microvascular disease. No acute intracranial abnormality. Electronically Signed   By: Kevin  Dover M.D.   On: 03/26/2022 23:20    Procedures Procedures   Medications Ordered in ED Medications  FLUoxetine (PROZAC) capsule 30 mg (has no administration in time range)  hydrOXYzine (ATARAX) tablet 10 mg (has no administration in time range)  levETIRAcetam (KEPPRA) tablet 750 mg (has no administration in time range)  lisinopril (ZESTRIL) tablet 2.5 mg (has no administration in time range)  metFORMIN (GLUCOPHAGE) tablet 1,000 mg (has no administration in time range)    ED Course/ Medical Decision Making/ A&P                           Medical Decision Making Amount and/or Complexity of Data Reviewed Labs: ordered. Radiology: ordered.  Risk Prescription drug management.  This patient presents to the ED for concern of altered mental status , this involves an extensive number of treatment options, and is a complaint that carries with it a high risk of complications and morbidity.  The differential diagnosis includes  stroke, drug/toxin ingestion, CNS infection, seizure, electrolyte abnormality etc   Co morbidities that complicate the patient evaluation TBI, seizures  Additional history obtained:  Additional history obtained from: sister at bedside  External records from outside source obtained and reviewed including: most recent Dr. Aquino note. Recent psychiatry provider note   Lab Results: I personally ordered, reviewed, and interpreted labs. Pertinent results include: CBC with anemia appears chronic CMP within normal limits  UDS negative  UA negative for UTI   Imaging Studies ordered:  I ordered imaging studies which included CT.  I independently reviewed & interpreted imaging & am in agreement with radiology impression. Imaging shows: CT head with no new acute findings   Medications  I ordered medication including home medication Reevaluation of the patient after medication shows that patient stayed the same -I reviewed the patient's home medications and did not make adjustments. -I did not prescribe new home medications.  Consultations: I requested consultation with the TTS,  and discussed lab and imaging findings as well as pertinent plan - they recommend: Pending evaluation  SDH TBI   ED Course:  55-year-old male who presents to the emergency department with a behavioral disturbance. Per her sister at bedside who provides the history the patient has had 3 days of aggressive behavior toward her and being more disoriented.  She denies new head trauma.  Denies any seizure activity.  He has been eating and drinking appropriately. His work-up here has been unremarkable.  CT head is negative.  His lab work is unremarkable.  His physical exam is unremarkable.  He has not been agitated here in the emergency department. On my review of his chart he does have repeated episodes of this aggressive type behavior.  The sister does not feel safe at home with him given his threats toward her.  I  have consulted TTS and will pend their evaluation.  He may be appropriate for Geri psych.  I have also ordered his home medication.  Patient will have disposition once he is evaluated by TTS in the morning.   After consideration of the diagnostic results and the patients response to treatment, I feel that the patent would benefit from TTS evaluation and disposition. .   The patient has been appropriately medically screened and/or stabilized in the ED. Final Clinical Impression(s) / ED Diagnoses Final diagnoses:  Behavior disturbance    Rx / DC Orders ED Discharge Orders     None         ,  E, PA-C 03/27/22 0156    Molpus, John, MD 03/27/22 0208  

## 2022-03-26 NOTE — ED Triage Notes (Addendum)
Family (sister who is PCG) reports "he is changing" as in becoming more disagreeable. He is at baseline gait and mental status but increase in confusion and mood. Not wanting to take his meds for TBI and diabetes. She has been giving hydroxine for agitation a few times but says it does not help. Verbally threatened her last night and today. They called [employer] last night and they did not bring him in. She says "it's not the same person". He is very confused.  Baseline is he is alert and oriented x 4. Performs ADLs at baseline. TBI 2018 from fall down stairs and pt had to learn to all ADLs again.

## 2022-03-27 MED ORDER — METFORMIN HCL 500 MG PO TABS: 1000.0000 mg | ORAL_TABLET | Freq: Two times a day (BID) | ORAL | Status: DC

## 2022-03-27 MED ORDER — LEVETIRACETAM 500 MG PO TABS
750.0000 mg | ORAL_TABLET | Freq: Two times a day (BID) | ORAL | Status: DC
  Administered 2022-03-27: 750 mg via ORAL
  Filled 2022-03-27: qty 1

## 2022-03-27 MED ORDER — LISINOPRIL 2.5 MG PO TABS
2.5000 mg | ORAL_TABLET | Freq: Every day | ORAL | Status: DC
  Administered 2022-03-27: 2.5 mg via ORAL
  Filled 2022-03-27: qty 1

## 2022-03-27 MED ORDER — FLUOXETINE HCL 20 MG PO CAPS
30.0000 mg | ORAL_CAPSULE | Freq: Every day | ORAL | Status: DC
  Administered 2022-03-27: 30 mg via ORAL
  Filled 2022-03-27: qty 1

## 2022-03-27 MED ORDER — HYDROXYZINE HCL 10 MG PO TABS: 10.0000 mg | ORAL_TABLET | Freq: Three times a day (TID) | ORAL | Status: DC | PRN

## 2022-03-27 NOTE — BH Assessment (Signed)
Comprehensive Clinical Assessment (CCA) Note  03/27/2022 [name] [place] TO:[id number] DISPOSITION: Leevy-Johnson NP recommends patient be cleared by psychiatry.   [facility] ED from 03/26/2022 in [place] DEPT Admission (Discharged) from 08/19/2021 in [facility] No Risk No Risk      The patient demonstrates the following risk factors for suicide: Chronic risk factors for suicide include: N/A. Acute risk factors for suicide include: N/A. Protective factors for this patient include: coping skills. Considering these factors, the overall suicide risk at this point appears to be low. Patient is appropriate for outpatient follow up.   [name] is a 56 year old male that presents [facility] to [facility] with AMS associated with a TBI. Patient's native language is Spanish as [name] was utilized for assessment along with collateral information obtained from sister (caretaker) [name] [phone] who is at bedside. Patient denies any S/I, H/I or AVH although provides a rather limited history. Patient's affect is flat and he is somewhat guarded. He is disorganized and oriented to person place only. Patient is unaware why he is presenting this date as he often looks to his sister to assist with gathering of information. 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 Sister states little known in reference to the accident stating "he fell from some stairs" which resulted in a TBI. Sister reports patient has been residing with her and she has been assisting with ongoing needs since then. Sister reports patient does not have a history of violence and she feels patient would not directly harm her although has been making threats the last 3 or 4 dates as his mental health state has deteriorated. Patient is  prescribed medical medications through his PCP to assist with patient's seizures and other health related issues (See MAR) and was recently seen on 12/23/21 at [facility] when he was brought in by his sister when he presented with similar symptoms. Per that event patient was recommended for OP medication management and saw Ronne Binning NP who assisted with medication management (See MAR). Sister states she manages his medications and reports compliance at this time.   Preyer RN writes on 5/25 at 2135: Family (sister who is PCG) reports "he is changing" as in becoming more disagreeable. He is at baseline gait and mental status but increase in confusion and mood. Not wanting to take his meds for TBI and diabetes. She has been giving hydroxine for agitation a few times but says it does not help. Verbally threatened her last night and today. They called [employer] last night and they did not bring him in. She says "it's not the same person". He is very confused. Baseline is he is alert and oriented x 4. Performs ADLs at baseline. TBI 2018 from fall down stairs and pt had to learn to all ADLs again.    Erskine Speed PA writes on 5/25 at 2119: [name] is a 56 y.o. male. With past medical history of T2DM, seizures, TBI, HTN who presents to the emergency department with altered mental status.    Professional interpreter utilized during interview. Sister present. She provides the history. States that over the past three days patient has been acting very aggressive and bizarre. She states that this has happened previously but only briefly during a single day and not ongoing for multiple days. She states that he has been threatening to hurt  her and wishing she were dead. She states that he has also not been wanting to take his medication. States he is normally oriented to self and year, but has been more altered. Denies any recent falls or head injury. Denies any fevers, nausea or vomiting or urinary symptoms. Patient denies any  pain. States that he did have one episode of diarrhea yesterday but has other wise been eating and drinking.  Has not had a seizure since last October.  He is followed by Dr. Delice Lesch.  Also followed by psychiatry.   On chart review it appears that he has had aggressive behaviors before and threatening behaviors toward his sister. There is documented caregiver fatigue.  Patient is oriented to place person only. Patient's affect is observed to be flat and he is somewhat guarded. Patient's memory is impaired and thoughts disorganized. Patient does not appear toe be responding to internal stimuli.           Chief Complaint:  Chief Complaint  Patient presents with   Altered Mental Status   Visit Diagnosis: Altered mental state    CCA Screening, Triage and Referral (STR)  Patient Reported Information How did you hear about [place]? Family/Friend  What Is the Reason for Your Visit/Call Today? pt brought in for AMS associated with a TBI  How Long Has This Been Causing You Problems? <Week  What Do You Feel Would Help You the Most Today? -- (UTA)   Have You Recently Had Any Thoughts About Hurting Yourself? No  Are You Planning to Commit Suicide/Harm Yourself At This time? No   Have you Recently Had Thoughts About Accident? No  Are You Planning to Harm Someone at This Time? No  Explanation: No data recorded  Have You Used Any Alcohol or Drugs in the Past 24 Hours? No  How Long Ago Did You Use Drugs or Alcohol? No data recorded What Did You Use and How Much? No data recorded  Do You Currently Have a Therapist/Psychiatrist? No  Name of Therapist/Psychiatrist: No data recorded  Have You Been Recently Discharged From Any Office Practice or Programs? No  Explanation of Discharge From Practice/Program: No data recorded    CCA Screening Triage Referral Assessment Type of Contact: Face-to-Face  Telemedicine Service Delivery:   Is this Initial or Reassessment? No data  recorded Date Telepsych consult ordered in [facility]:  No data recorded Time Telepsych consult ordered in [facility]:  No data recorded Location of Assessment: [facility] ED  Provider Location: Other (comment) ([facility])   Collateral Involvement: Sister who is present   Does Patient Have a [place]? No data recorded Name and Contact of Legal Guardian: No data recorded If Minor and Not Living with Parent(s), Who has Custody? NA  Is CPS involved or ever been involved? Never  Is APS involved or ever been involved? Never   Patient Determined To Be At Risk for Harm To Self or Others Based on Review of Patient Reported Information or Presenting Complaint? No  Method: No data recorded Availability of Means: No data recorded Intent: No data recorded Notification Required: No data recorded Additional Information for Danger to Others Potential: No data recorded Additional Comments for Danger to Others Potential: No data recorded Are There Guns or Other Weapons in Your Home? No data recorded Types of Guns/Weapons: No data recorded Are These Weapons Safely Secured?  No data recorded Who Could Verify You Are Able To Have These Secured: No data recorded Do You Have any Outstanding Charges, Pending Court Dates, Parole/Probation? No data recorded Contacted To Inform of Risk of Harm To Self or Others: Other: Comment (NA)    Does Patient Present under Involuntary Commitment? No  IVC Papers Initial File Date: No data recorded  [place] of Residence: [place]   Patient Currently Receiving the Following Services: Not Receiving Services   Determination of Need: Routine (7 days)   Options For Referral: Outpatient Therapy     CCA Biopsychosocial Patient Reported Schizophrenia/Schizoaffective Diagnosis in Past: No   Strengths: UTA   Mental Health Symptoms Depression:   -- (UTA)   Duration of Depressive symptoms:    Mania:   -- (UTA)   Anxiety:     Difficulty concentrating; Irritability; Tension   Psychosis:   None   Duration of Psychotic symptoms:    Trauma:   None   Obsessions:   None   Compulsions:   None   Inattention:   None   Hyperactivity/Impulsivity:   None   Oppositional/Defiant Behaviors:   None   Emotional Irregularity:   -- (UTA)   Other Mood/Personality Symptoms:   NA    Mental Status Exam Appearance and self-care  Stature:   Average   Weight:   Average weight   Clothing:   Disheveled   Grooming:   Neglected   Cosmetic use:   None   Posture/gait:   Normal   Motor activity:   Not Remarkable   Sensorium  Attention:   Confused   Concentration:   Preoccupied; Scattered   Orientation:   Person; Place   Recall/memory:   Defective in Immediate; Defective in Short-term; Defective in Recent; Defective in Remote   Affect and Mood  Affect:   Blunted   Mood:   Depressed   Relating  Eye contact:   Fleeting   Facial expression:   Depressed   Attitude toward examiner:   Guarded   Thought and Language  Speech flow:  Soft   Thought content:   -- (UTA)   Preoccupation:   None   Hallucinations:   None   Organization:  No data recorded  [employer] of Knowledge:   Poor   Intelligence:   Needs investigation   Abstraction:   -- (UTA)   Judgement:   Poor   Reality Testing:   -- (UTA)   Insight:   Poor   Decision Making:   Confused   Social Functioning  Social Maturity:   Responsible   Social Judgement:   -- Special educational needs teacher)   Stress  Stressors:   Family conflict   Coping Ability:   -- Special educational needs teacher)   Skill Deficits:   Decision making   Supports:   Family     Religion: Religion/Spirituality Are You A Religious Person?:  (UTA) How Might This Affect Treatment?: NA  Leisure/Recreation: Leisure / Recreation Do You Have Hobbies?:  (UTA)  Exercise/Diet: Exercise/Diet Do You Exercise?:  (UTA) Have You Gained or Lost A Significant  Amount of Weight in the Past Six Months?: No (Per sister) Do You Follow a Special Diet?: No Do You Have Any Trouble Sleeping?: No   CCA Employment/Education Employment/Work Situation: Employment / Work Situation Employment Situation: Unemployed Has Patient ever Been in Passenger transport manager?: No  Education: Education Is Patient Currently Attending School?: No Last Grade Completed:  (GED per sister) Did You Attend College?: No Did You Have An Individualized Education  Program (IIEP): No Did You Have Any Difficulty At School?: No Patient's Education Has Been Impacted by Current Illness: No   CCA Family/Childhood History Family and Relationship History: Family history Marital status: Single Does patient have children?: No  Childhood History:  Childhood History By whom was/is the patient raised?:  (UTA) Did patient suffer any verbal/emotional/physical/sexual abuse as a child?:  (UTA) Did patient suffer from severe childhood neglect?:  (UTA) Has patient ever been sexually abused/assaulted/raped as an adolescent or adult?:  (UTA) Was the patient ever a victim of a crime or a disaster?:  (UTA) Witnessed domestic violence?:  (UTA) Has patient been affected by domestic violence as an adult?:  Special educational needs teacher)  Child/Adolescent Assessment:     CCA Substance Use Alcohol/Drug Use: Alcohol / Drug Use Pain Medications: See MAR Prescriptions: See MAR Over the Counter: See MAR History of alcohol / drug use?: No history of alcohol / drug abuse                         ASAM's:  Six Dimensions of Multidimensional Assessment  Dimension 1:  Acute Intoxication and/or Withdrawal Potential:      Dimension 2:  Biomedical Conditions and Complications:      Dimension 3:  Emotional, Behavioral, or Cognitive Conditions and Complications:     Dimension 4:  Readiness to Change:     Dimension 5:  Relapse, Continued use, or Continued Problem Potential:     Dimension 6:  Recovery/Living Environment:      ASAM Severity Score:    ASAM Recommended Level of Treatment:     Substance use Disorder (SUD)    Recommendations for Services/Supports/Treatments:    Discharge Disposition:    DSM5 Diagnoses: Patient Active Problem List   Diagnosis Date Noted   Gastric polyp    Gastritis and gastroduodenitis    History of traumatic brain injury 05/29/2021   MDD (major depressive disorder), recurrent, 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/12/2020   MDD (major depressive disorder), recurrent episode, moderate ([place]) 07/16/2020   Influenza vaccine needed 07/14/2019   Microalbuminuria due to type 2 diabetes mellitus ([place]) 04/11/2019   Hypothyroidism 09/21/2018   Seizure disorder ([place]) 06/16/2018   Low testosterone 06/16/2018   Traumatic brain injury with loss of consciousness ([place]) 10/29/2017   Anemia, chronic disease 10/29/2017   Positive depression screening 10/29/2017   Attention and concentration deficit    Type 2 diabetes mellitus with peripheral neuropathy ([place])    S/P craniotomy 03/19/2017   Essential hypertension    Dysphagia, oropharyngeal 02/01/2017   Diabetes type 2, uncontrolled 06/13/2015     Referrals to Alternative Service(s): Referred to Alternative Service(s):   Place:   Date:   Time:    Referred to Alternative Service(s):   Place:   Date:   Time:    Referred to Alternative Service(s):   Place:   Date:   Time:    Referred to Alternative Service(s):   Place:   Date:   Time:     Mamie Nick, LCAS

## 2022-03-27 NOTE — ED Provider Notes (Signed)
  Physical Exam  BP 116/67 (BP Location: Right Arm)   Pulse 73   Temp 98.1 F (36.7 C) (Oral)   Resp 18   SpO2 100%   Physical Exam  Procedures  Procedures  ED Course / MDM    Medical Decision Making Amount and/or Complexity of Data Reviewed Labs: ordered.   Patient's been seen by psychiatry and cleared for discharge       Benjiman Core, MD 03/27/22 1744

## 2022-03-27 NOTE — Discharge Instructions (Addendum)
Follow-up with either the resources given or with his psychiatrist.

## 2022-05-01 ENCOUNTER — Other Ambulatory Visit: Payer: Self-pay

## 2022-06-16 ENCOUNTER — Other Ambulatory Visit (HOSPITAL_COMMUNITY): Payer: Self-pay | Admitting: Psychiatry

## 2022-06-16 ENCOUNTER — Other Ambulatory Visit: Payer: Self-pay | Admitting: Internal Medicine

## 2022-06-16 DIAGNOSIS — E1129 Type 2 diabetes mellitus with other diabetic kidney complication: Secondary | ICD-10-CM

## 2022-06-16 DIAGNOSIS — R809 Proteinuria, unspecified: Secondary | ICD-10-CM

## 2022-06-16 DIAGNOSIS — E119 Type 2 diabetes mellitus without complications: Secondary | ICD-10-CM

## 2022-06-16 DIAGNOSIS — F3341 Major depressive disorder, recurrent, in partial remission: Secondary | ICD-10-CM

## 2022-06-16 DIAGNOSIS — I10 Essential (primary) hypertension: Secondary | ICD-10-CM

## 2022-06-17 ENCOUNTER — Other Ambulatory Visit: Payer: Self-pay

## 2022-06-17 ENCOUNTER — Other Ambulatory Visit (HOSPITAL_COMMUNITY): Payer: Self-pay

## 2022-06-17 MED ORDER — FLUOXETINE HCL 10 MG PO CAPS
ORAL_CAPSULE | ORAL | 3 refills | Status: DC
  Filled 2022-06-17 (×2): qty 90, 30d supply, fill #0
  Filled 2022-07-15 (×2): qty 90, 30d supply, fill #1
  Filled 2022-08-18: qty 90, 30d supply, fill #2
  Filled 2022-11-03: qty 90, 30d supply, fill #3

## 2022-06-22 ENCOUNTER — Other Ambulatory Visit: Payer: Self-pay

## 2022-06-22 ENCOUNTER — Other Ambulatory Visit: Payer: Self-pay | Admitting: Pharmacist

## 2022-06-22 ENCOUNTER — Other Ambulatory Visit (HOSPITAL_COMMUNITY): Payer: Self-pay

## 2022-06-22 DIAGNOSIS — I10 Essential (primary) hypertension: Secondary | ICD-10-CM

## 2022-06-22 DIAGNOSIS — R809 Proteinuria, unspecified: Secondary | ICD-10-CM

## 2022-06-22 MED ORDER — LISINOPRIL 2.5 MG PO TABS
ORAL_TABLET | Freq: Every day | ORAL | 0 refills | Status: DC
  Filled 2022-06-22 – 2022-07-30 (×2): qty 30, 30d supply, fill #0

## 2022-06-22 NOTE — Chronic Care Management (AMB) (Signed)
Patient seen by Jarome Matin, PharmD Candidate on 06/22/2022 while they were picking up prescriptions at [facility] at [facility]. Patient was accompanied by his sister who is his caregiver.   Blood pressure today was : 101/64, HR 75   Patient does not have an automated home blood pressure machine.   Medication review was performed. They are taking medications as prescribed. Patient was at pharmacy with sister to pick up metformin and lisinopril. He was unable to get them at first due to needing an appt. After pharmacy visit, him and his sister made an appt with PCP. A new prescription was sent to the pharmacy.   The following barriers to adherence were noted:  - Needing Refills   The following interventions were completed:  - Medications were reviewed  - Patient was educated on medications, including indication and administration  - Patient was educated on how to access home blood pressure machine  - Patient was counseled on lifestyle modifications to improve blood pressure   The patient has follow up scheduled:  PCP: 07/09/2022 at 9:10 AM.    Janeice Robinson, PharmD, [facility]  [phone]

## 2022-06-23 ENCOUNTER — Other Ambulatory Visit (HOSPITAL_COMMUNITY): Payer: Self-pay

## 2022-06-29 ENCOUNTER — Other Ambulatory Visit: Payer: Self-pay

## 2022-07-09 ENCOUNTER — Ambulatory Visit: Payer: No Typology Code available for payment source | Attending: Internal Medicine | Admitting: Internal Medicine

## 2022-07-09 ENCOUNTER — Other Ambulatory Visit: Payer: Self-pay

## 2022-07-09 ENCOUNTER — Other Ambulatory Visit: Payer: Self-pay | Admitting: Internal Medicine

## 2022-07-09 ENCOUNTER — Encounter: Payer: Self-pay | Admitting: Internal Medicine

## 2022-07-09 VITALS — BP 113/73 | HR 71 | Temp 98.3°F | Resp 16 | Wt 132.0 lb

## 2022-07-09 DIAGNOSIS — G40909 Epilepsy, unspecified, not intractable, without status epilepticus: Secondary | ICD-10-CM

## 2022-07-09 DIAGNOSIS — Z2821 Immunization not carried out because of patient refusal: Secondary | ICD-10-CM

## 2022-07-09 DIAGNOSIS — D649 Anemia, unspecified: Secondary | ICD-10-CM

## 2022-07-09 DIAGNOSIS — E1129 Type 2 diabetes mellitus with other diabetic kidney complication: Secondary | ICD-10-CM

## 2022-07-09 DIAGNOSIS — I10 Essential (primary) hypertension: Secondary | ICD-10-CM

## 2022-07-09 DIAGNOSIS — Z730 Burn-out: Secondary | ICD-10-CM

## 2022-07-09 DIAGNOSIS — R809 Proteinuria, unspecified: Secondary | ICD-10-CM

## 2022-07-09 DIAGNOSIS — E119 Type 2 diabetes mellitus without complications: Secondary | ICD-10-CM

## 2022-07-09 DIAGNOSIS — R4689 Other symptoms and signs involving appearance and behavior: Secondary | ICD-10-CM

## 2022-07-09 LAB — POCT GLYCOSYLATED HEMOGLOBIN (HGB A1C): Hemoglobin A1C: 5.4 % (ref 4.0–5.6)

## 2022-07-09 LAB — GLUCOSE, POCT (MANUAL RESULT ENTRY): POC Glucose: 98 mg/dl (ref 70–99)

## 2022-07-09 MED ORDER — METFORMIN HCL 1000 MG PO TABS
1000.0000 mg | ORAL_TABLET | Freq: Two times a day (BID) | ORAL | 2 refills | Status: DC
  Filled 2022-07-09: qty 60, 30d supply, fill #0
  Filled 2022-08-18: qty 60, 30d supply, fill #1
  Filled 2022-11-03: qty 60, 30d supply, fill #2

## 2022-07-09 MED ORDER — ATORVASTATIN CALCIUM 10 MG PO TABS
10.0000 mg | ORAL_TABLET | Freq: Every day | ORAL | 2 refills | Status: DC
  Filled 2022-07-09: qty 30, 30d supply, fill #0
  Filled 2022-10-06: qty 30, 30d supply, fill #1
  Filled 2022-11-03: qty 30, 30d supply, fill #2
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
## 2022-07-09 NOTE — Patient Instructions (Signed)
Consider getting him into an adult day program. Try to set up a daily routine for him including chores for him to complete. Keep upcoming appointment next month with his behavioral health specialist.

## 2022-07-10 LAB — IRON,TIBC AND FERRITIN PANEL
Ferritin: 87 ng/mL (ref 30–400)
Iron Saturation: 31 % (ref 15–55)
Iron: 97 ug/dL (ref 38–169)
Total Iron Binding Capacity: 308 ug/dL (ref 250–450)
UIBC: 211 ug/dL (ref 111–343)

## 2022-07-10 LAB — CBC
Hematocrit: 32.2 % — ABNORMAL LOW (ref 37.5–51.0)
Hemoglobin: 11.1 g/dL — ABNORMAL LOW (ref 13.0–17.7)
MCH: 30 pg (ref 26.6–33.0)
MCHC: 34.5 g/dL (ref 31.5–35.7)
MCV: 87 fL (ref 79–97)
Platelets: 212 10*3/uL (ref 150–450)
RBC: 3.7 x10E6/uL — ABNORMAL LOW (ref 4.14–5.80)
RDW: 11.9 % (ref 11.6–15.4)
WBC: 4.4 10*3/uL (ref 3.4–10.8)

## 2022-07-15 ENCOUNTER — Other Ambulatory Visit: Payer: Self-pay

## 2022-07-30 ENCOUNTER — Encounter: Payer: Self-pay | Admitting: Neurology

## 2022-07-30 ENCOUNTER — Other Ambulatory Visit: Payer: Self-pay

## 2022-07-30 ENCOUNTER — Ambulatory Visit (INDEPENDENT_AMBULATORY_CARE_PROVIDER_SITE_OTHER): Payer: Self-pay | Admitting: Neurology

## 2022-07-30 VITALS — BP 115/63 | HR 71 | Ht 63.0 in | Wt 136.2 lb

## 2022-07-30 DIAGNOSIS — G40209 Localization-related (focal) (partial) symptomatic epilepsy and epileptic syndromes with complex partial seizures, not intractable, without status epilepticus: Secondary | ICD-10-CM

## 2022-07-30 DIAGNOSIS — Z87820 Personal history of traumatic brain injury: Secondary | ICD-10-CM

## 2022-07-30 MED ORDER — LEVETIRACETAM 500 MG PO TABS
ORAL_TABLET | ORAL | 3 refills | Status: DC
  Filled 2022-07-30: qty 270, fill #0
  Filled 2022-08-18: qty 270, 90d supply, fill #0
  Filled 2022-11-12: qty 270, 90d supply, fill #1

## 2022-07-30 NOTE — Patient Instructions (Signed)
Good to see you.  Continue Keppra (Levetiracetam) 500mg : take 1 and 1/2 tablets twice a day  2. Continue follow-up with Psychiatry  3. Follow-up in 6 months, call for any changes
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
## 2022-07-31 ENCOUNTER — Other Ambulatory Visit: Payer: Self-pay

## 2022-08-18 ENCOUNTER — Other Ambulatory Visit: Payer: Self-pay

## 2022-08-18 ENCOUNTER — Other Ambulatory Visit: Payer: Self-pay | Admitting: Internal Medicine

## 2022-08-18 DIAGNOSIS — I10 Essential (primary) hypertension: Secondary | ICD-10-CM

## 2022-08-18 DIAGNOSIS — E1129 Type 2 diabetes mellitus with other diabetic kidney complication: Secondary | ICD-10-CM

## 2022-08-18 MED ORDER — LISINOPRIL 2.5 MG PO TABS
ORAL_TABLET | Freq: Every day | ORAL | 0 refills | Status: DC
  Filled 2022-08-18: qty 90, fill #0
  Filled 2022-10-06: qty 30, 30d supply, fill #0
  Filled 2022-11-03: qty 30, 30d supply, fill #1
  Filled 2022-12-29: qty 30, 30d supply, fill #2

## 2022-08-18 NOTE — Telephone Encounter (Signed)
Requested Prescriptions  Pending Prescriptions Disp Refills  . lisinopril (ZESTRIL) 2.5 MG tablet 90 tablet 0    Sig: TAKE 1 TABLET (2.5 MG TOTAL) BY MOUTH DAILY.     Cardiovascular:  ACE Inhibitors Passed - 08/18/2022 10:16 AM      Passed - Cr in normal range and within 180 days    Creat  Date Value Ref Range Status  06/02/2016 0.71 0.70 - 1.33 mg/dL Final    Comment:      For patients > or = 57 years of age: The upper reference limit for Creatinine is approximately 13% higher for people identified as African-American.      Creatinine, Ser  Date Value Ref Range Status  03/26/2022 1.08 0.61 - 1.24 mg/dL Final   Creatinine, Urine  Date Value Ref Range Status  06/02/2016 67 20 - 370 mg/dL Final         Passed - K in normal range and within 180 days    Potassium  Date Value Ref Range Status  03/26/2022 3.8 3.5 - 5.1 mmol/L Final         Passed - Patient is not pregnant      Passed - Last BP in normal range    BP Readings from Last 1 Encounters:  07/30/22 115/63         Passed - Valid encounter within last 6 months    Recent Outpatient Visits          1 month ago Type 2 diabetes mellitus with microalbuminuria, without long-term current use of insulin ([place])   [place], Neoma Laming B, MD   10 months ago Type 2 diabetes mellitus with microalbuminuria, without long-term current use of insulin ([place])   [place], Deborah B, MD   1 year ago Type 2 diabetes mellitus with microalbuminuria, without long-term current use of insulin ([place])   [place], Deborah B, MD   1 year ago Type 2 diabetes mellitus with microalbuminuria, without long-term current use of insulin [facility])   [place], MD   1 year ago Need for influenza vaccination   [place], RPH-CPP       Future Appointments            In 2 months Wynetta Emery Dalbert Batman, MD [place]

## 2022-09-28 ENCOUNTER — Encounter (HOSPITAL_COMMUNITY): Payer: Self-pay | Admitting: Emergency Medicine

## 2022-09-28 ENCOUNTER — Emergency Department (HOSPITAL_COMMUNITY)
Admission: EM | Admit: 2022-09-28 | Discharge: 2022-10-03 | Disposition: A | Discharge status: Home / Self Care | Payer: Self-pay | Attending: Emergency Medicine | Admitting: Emergency Medicine

## 2022-09-28 DIAGNOSIS — E119 Type 2 diabetes mellitus without complications: Secondary | ICD-10-CM | POA: Insufficient documentation

## 2022-09-28 DIAGNOSIS — F69 Unspecified disorder of adult personality and behavior: Secondary | ICD-10-CM

## 2022-09-28 DIAGNOSIS — R4689 Other symptoms and signs involving appearance and behavior: Secondary | ICD-10-CM | POA: Diagnosis present

## 2022-09-28 DIAGNOSIS — Z79899 Other long term (current) drug therapy: Secondary | ICD-10-CM | POA: Insufficient documentation

## 2022-09-28 DIAGNOSIS — F989 Unspecified behavioral and emotional disorders with onset usually occurring in childhood and adolescence: Secondary | ICD-10-CM | POA: Insufficient documentation

## 2022-09-28 DIAGNOSIS — E039 Hypothyroidism, unspecified: Secondary | ICD-10-CM | POA: Insufficient documentation

## 2022-09-28 DIAGNOSIS — Z7984 Long term (current) use of oral hypoglycemic drugs: Secondary | ICD-10-CM | POA: Insufficient documentation

## 2022-09-28 DIAGNOSIS — R456 Violent behavior: Secondary | ICD-10-CM | POA: Insufficient documentation

## 2022-09-28 DIAGNOSIS — F29 Unspecified psychosis not due to a substance or known physiological condition: Secondary | ICD-10-CM | POA: Insufficient documentation

## 2022-09-28 DIAGNOSIS — Z87820 Personal history of traumatic brain injury: Secondary | ICD-10-CM | POA: Insufficient documentation

## 2022-09-28 DIAGNOSIS — Z20822 Contact with and (suspected) exposure to covid-19: Secondary | ICD-10-CM | POA: Insufficient documentation

## 2022-09-28 DIAGNOSIS — Z91040 Latex allergy status: Secondary | ICD-10-CM | POA: Insufficient documentation

## 2022-09-28 DIAGNOSIS — I10 Essential (primary) hypertension: Secondary | ICD-10-CM | POA: Insufficient documentation

## 2022-09-28 MED ORDER — LORAZEPAM 2 MG/ML IJ SOLN
2.0000 mg | Freq: Once | INTRAMUSCULAR | Status: AC
  Administered 2022-09-28: 2 mg via INTRAMUSCULAR
  Filled 2022-09-28: qty 1
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
## 2022-09-29 DIAGNOSIS — R4689 Other symptoms and signs involving appearance and behavior: Secondary | ICD-10-CM

## 2022-09-29 LAB — COMPREHENSIVE METABOLIC PANEL
ALT: 25 U/L (ref 0–44)
AST: 21 U/L (ref 15–41)
Albumin: 3.6 g/dL (ref 3.5–5.0)
Alkaline Phosphatase: 39 U/L (ref 38–126)
Anion gap: 10 (ref 5–15)
BUN: 29 mg/dL — ABNORMAL HIGH (ref 6–20)
CO2: 24 mmol/L (ref 22–32)
Calcium: 9 mg/dL (ref 8.9–10.3)
Chloride: 103 mmol/L (ref 98–111)
Creatinine, Ser: 1.07 mg/dL (ref 0.61–1.24)
GFR, Estimated: >60 mL/min (ref >60)
Glucose, Bld: 164 mg/dL — ABNORMAL HIGH (ref 70–99)
Potassium: 4.1 mmol/L (ref 3.5–5.1)
Sodium: 137 mmol/L (ref 135–145)
Total Bilirubin: 0.5 mg/dL (ref 0.3–1.2)
Total Protein: 6.7 g/dL (ref 6.5–8.1)

## 2022-09-29 LAB — CBC WITH DIFFERENTIAL/PLATELET
Abs Immature Granulocytes: 0.04 10*3/uL (ref 0.00–0.07)
Basophils Absolute: 0 10*3/uL (ref 0.0–0.1)
Basophils Relative: 0 %
Eosinophils Absolute: 0 10*3/uL (ref 0.0–0.5)
Eosinophils Relative: 0 %
HCT: 27.9 % — ABNORMAL LOW (ref 39.0–52.0)
Hemoglobin: 9.5 g/dL — ABNORMAL LOW (ref 13.0–17.0)
Immature Granulocytes: 1 %
Lymphocytes Relative: 8 %
Lymphs Abs: 0.6 10*3/uL — ABNORMAL LOW (ref 0.7–4.0)
MCH: 31.1 pg (ref 26.0–34.0)
MCHC: 34.1 g/dL (ref 30.0–36.0)
MCV: 91.5 fL (ref 80.0–100.0)
Monocytes Absolute: 0.4 10*3/uL (ref 0.1–1.0)
Monocytes Relative: 5 %
Neutro Abs: 6.7 10*3/uL (ref 1.7–7.7)
Neutrophils Relative %: 86 %
Platelets: 267 10*3/uL (ref 150–400)
RBC: 3.05 MIL/uL — ABNORMAL LOW (ref 4.22–5.81)
RDW: 12.5 % (ref 11.5–15.5)
WBC: 7.7 10*3/uL (ref 4.0–10.5)
nRBC: 0 % (ref 0.0–0.2)

## 2022-09-29 LAB — URINALYSIS, COMPLETE (UACMP) WITH MICROSCOPIC
Bilirubin Urine: NEGATIVE
Glucose, UA: NEGATIVE mg/dL
Hgb urine dipstick: NEGATIVE
Ketones, ur: NEGATIVE mg/dL
Leukocytes,Ua: NEGATIVE
Nitrite: NEGATIVE
Protein, ur: 100 mg/dL — AB
Specific Gravity, Urine: 1.02 (ref 1.005–1.030)
pH: 5 (ref 5.0–8.0)

## 2022-09-29 LAB — ETHANOL: Alcohol, Ethyl (B): <10 mg/dL (ref <10)

## 2022-09-29 LAB — RESP PANEL BY RT-PCR (FLU A&B, COVID) ARPGX2
Influenza A by PCR: NEGATIVE
Influenza B by PCR: NEGATIVE
SARS Coronavirus 2 by RT PCR: NEGATIVE

## 2022-09-29 LAB — RAPID URINE DRUG SCREEN, HOSP PERFORMED
Amphetamines: NOT DETECTED
Barbiturates: NOT DETECTED
Benzodiazepines: NOT DETECTED
Cocaine: NOT DETECTED
Opiates: NOT DETECTED
Tetrahydrocannabinol: NOT DETECTED

## 2022-09-29 LAB — SALICYLATE LEVEL: Salicylate Lvl: <7 mg/dL — ABNORMAL LOW (ref 7.0–30.0)

## 2022-09-29 LAB — ACETAMINOPHEN LEVEL: Acetaminophen (Tylenol), Serum: <10 ug/mL — ABNORMAL LOW (ref 10–30)

## 2022-09-29 MED ORDER — QUETIAPINE FUMARATE 25 MG PO TABS
50.0000 mg | ORAL_TABLET | Freq: Every day | ORAL | Status: DC
  Administered 2022-09-29: 50 mg via ORAL
  Filled 2022-09-29: qty 2

## 2022-09-29 MED ORDER — ZIPRASIDONE MESYLATE 20 MG IM SOLR: 10.0000 mg | Freq: Three times a day (TID) | INTRAMUSCULAR | Status: DC | PRN

## 2022-09-29 MED ORDER — ZIPRASIDONE MESYLATE 20 MG IM SOLR
20.0000 mg | Freq: Once | INTRAMUSCULAR | Status: AC
  Administered 2022-09-29: 20 mg via INTRAMUSCULAR
  Filled 2022-09-29: qty 20

## 2022-09-29 NOTE — Progress Notes (Signed)
Inpatient Behavioral Health Placement  Pt meets inpatient criteria per Eligha Bridegroom, NP. There are no available beds at [facility] per [facility] Rona Ravens, RN. Referral was sent to the following facilities;   Destination  Service Provider Address Phone Fax  [facility]  [address] [place], [place] [address] [phone]  [facility]  [address] [place] [address] [phone]  [facility]  [address] [place], [place] [address] [phone]  [facility]  [address] [place] [address] [phone]  [facility]  [address] [place] [address] [phone]  [facility]  [address], [place] [address] [phone]  [facility]  [address] [place] [address] [phone]  [facility]  [address] [place] [address] [phone]  [facility]  [address] [place] [address] [phone]  [facility]  [address] [place] [address] [phone]  [facility]  [address] [place] [address] [phone]  [facility]  [address] [place] [address] [phone]  [facility]  [address], [place] [address] [phone]  [facility]  [address] [place] [address] [phone]  [facility]  [address] [place] [address] [phone]    Situation ongoing,  CSW will follow up.   Maryjean Ka, MSW, LCSWA 09/29/2022  @ 3:30 PM

## 2022-09-29 NOTE — ED Notes (Signed)
Pt was recently seen by [facility] and family was updated by [facility] at bedside. Pt refusing vitals and breakfast tray. NAD.

## 2022-09-29 NOTE — BH Assessment (Signed)
TTS clinician attempted to complete assessment. Per Gabriel Rung, patient received geodon and is asleep. TTS will attempt on next shift.
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
## 2022-09-29 NOTE — ED Notes (Signed)
Patient refusing to change into hospital required attire. MD notified orders received. Patient aggressive with staff with medication administration. Medication administration assisted by security staff. Patient changed into hospital required attire.

## 2022-09-29 NOTE — ED Notes (Signed)
Re-approached for blood draw. Allows staff to draw blood and obtain vital signs. 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

## 2022-09-29 NOTE — ED Notes (Signed)
Patient offered food but states he does not want to eat; Pt left fluids on bedside table and encouraged to eat or drink-Monique,RN
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
## 2022-09-29 NOTE — ED Notes (Signed)
Attempted to take pt vital signs, pt refuses.

## 2022-09-30 ENCOUNTER — Ambulatory Visit: Payer: Self-pay | Admitting: *Deleted

## 2022-09-30 DIAGNOSIS — R451 Restlessness and agitation: Secondary | ICD-10-CM

## 2022-09-30 DIAGNOSIS — Z87820 Personal history of traumatic brain injury: Secondary | ICD-10-CM | POA: Diagnosis not present

## 2022-09-30 DIAGNOSIS — R4689 Other symptoms and signs involving appearance and behavior: Secondary | ICD-10-CM

## 2022-09-30 MED ORDER — QUETIAPINE FUMARATE 25 MG PO TABS
50.0000 mg | ORAL_TABLET | Freq: Two times a day (BID) | ORAL | Status: DC
  Administered 2022-09-30 – 2022-10-02 (×5): 50 mg via ORAL
  Filled 2022-09-30 (×5): qty 2

## 2022-09-30 MED ORDER — FLUOXETINE HCL 20 MG PO CAPS
30.0000 mg | ORAL_CAPSULE | Freq: Every day | ORAL | Status: DC
  Administered 2022-10-01 – 2022-10-02 (×2): 30 mg via ORAL
  Filled 2022-09-30 (×5): qty 1

## 2022-09-30 MED ORDER — LORAZEPAM 2 MG/ML IJ SOLN: 2.0000 mg | Freq: Four times a day (QID) | INTRAMUSCULAR | Status: DC | PRN

## 2022-09-30 MED ORDER — HYDROXYZINE HCL 25 MG PO TABS
25.0000 mg | ORAL_TABLET | Freq: Three times a day (TID) | ORAL | Status: DC
  Administered 2022-09-30 – 2022-10-02 (×8): 25 mg via ORAL
  Filled 2022-09-30 (×9): qty 1

## 2022-09-30 MED ORDER — LORAZEPAM 1 MG PO TABS: 1.0000 mg | ORAL_TABLET | Freq: Four times a day (QID) | ORAL | Status: DC | PRN

## 2022-09-30 MED ORDER — LEVETIRACETAM 750 MG PO TABS
750.0000 mg | ORAL_TABLET | Freq: Two times a day (BID) | ORAL | Status: DC
  Administered 2022-09-30 – 2022-10-02 (×6): 750 mg via ORAL
  Filled 2022-09-30 (×8): qty 1

## 2022-09-30 MED ORDER — HYDROXYZINE HCL 25 MG PO TABS: 25.0000 mg | ORAL_TABLET | Freq: Three times a day (TID) | ORAL | Status: DC

## 2022-09-30 NOTE — ED Notes (Signed)
Pt. IVC'd 09/28/22; expiration 10/05/22.  Documentation with RN in yellow/purple zone.
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
## 2022-09-30 NOTE — ED Provider Notes (Signed)
Emergency Medicine Observation Re-evaluation Note  Datron Brakebill is a 56 y.o. male, seen on rounds today.  Pt initially presented to the ED for complaints of IVC Currently, the patient is resting, lying essentially still in his gurney.  Physical Exam  BP 136/80   Pulse 75   Temp 98.7 F (37.1 C) (Oral)   Resp 18   Ht 5\' 3"  (1.6 m)   Wt 62 kg   SpO2 99%   BMI 24.21 kg/m  Physical Exam General: No distress Cardiac: Regular rate and rhythm Lungs: No increased work of breathing Psych: Calm, withdrawn  ED Course / MDM  EKG:   I have reviewed the labs performed to date as well as medications administered while in observation.  Recent changes in the last 24 hours include none.  Plan  Current plan is for placement.    , MD 09/30/22 1525
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
## 2022-09-30 NOTE — Progress Notes (Signed)
Patient has been denied by [facility] due to no appropriate beds available. Patient meets BH inpatient criteria per Joaquin Courts, FNP. Patient has been faxed out to the following facilities:    [facility]  [address] [place], [place] [address] [phone]  [facility]  [address] [place] [address] [phone]  [facility]  [address] [place], [place] [address] [phone]  [facility] [address] [facility]  [address] [place] [address] [phone]  [facility]  [address] [place] [address] [phone]  [facility]  [address] [place] [address] [phone]  [facility]  [address] [place] [address] [phone]  [facility]  [address], [place] [address] [phone]  [facility]  [address], [place] [address] [phone]  [facility]  [address], [place] [address] [phone]  [facility]  [address], [place] [address] [phone]  [facility]  [address] [place] [address] [phone]  [facility]  [address], [place] [address] [phone]  [facility]  [address] [place], [place] [address] [phone]  [facility]  [address] [place] [address] [phone]  [facility]  [address] [place] [address] [phone]  [facility] [address]  [address], [place] [address] [phone]  [facility]  [address] [place] [address] [phone]   Damita Dunnings, MSW, LCSW-A  8:59 PM 09/30/2022

## 2022-09-30 NOTE — Progress Notes (Addendum)
Pt has been denied at [facility]. Pt denied at [facility] due to TBI.   Pt meets inpatient criteria per Joaquin Courts, FNP. Referral was sent to out of network providers:  Destination  Service Provider Address Phone Fax  [facility]  [address] [place], [place] [address] [phone]  [facility]  [address] [place] [address] [phone]  [facility]  [address] [place], [place] [address] [phone]  [facility]  [address] [place] [address] [phone]  [facility]  [address] [place] [address] [phone]  [facility]  [address] [place] [address] [phone]  [facility]  [address] [place] [address] [phone]  [facility]  [address], [place] [address] [phone]  [facility]  [address], [place] [address] [phone]  [facility]  [address] [place] [address] [phone]  [facility]  [address], [place] [address] [phone]  [facility]  [address] [place] [address] [phone]  [facility]  [address] [place] [address] [phone]  [facility]  [address] [place], [place] [address] [phone]  [facility]  [address] [place] [address] [phone]  [facility]  [address] [place] [address] [phone]  [facility] [address]  [address],  [place] [address] [phone]  [facility]  [address] [place] [address] [phone]    Maryjean Ka, MSW, LCSWA 09/30/2022 12:07 PM
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
## 2022-10-01 ENCOUNTER — Other Ambulatory Visit: Payer: Self-pay

## 2022-10-01 ENCOUNTER — Ambulatory Visit: Payer: Self-pay

## 2022-10-01 MED ORDER — QUETIAPINE FUMARATE 50 MG PO TABS
50.0000 mg | ORAL_TABLET | Freq: Two times a day (BID) | ORAL | 0 refills | Status: DC
  Filled 2022-10-01: qty 30, 15d supply, fill #0

## 2022-10-01 NOTE — Discharge Instructions (Addendum)
Psychiatrist prescribed seroquel. Please pick up the prescription and take it.    Go to a shelter   Return to ER if you have thoughts of harming yourself or others    Llame para programar una cita de seguimiento tanto con su proveedor de atencin primaria como con su proveedor de salud mental. La informacin de contacto tanto del proveedor mdico como de salud mental figura en las instrucciones de seguimiento del alta.  Como se mencion, su Seroquel aument de 50 mg de Seroquel una vez al da a dos veces al da. Se ha enviado una nueva receta a su farmacia.   Medicaid Expansion Beginning October 02, 2022, more [place] will be able to get health coverage through [facility]. The [facility] Department of Health and [employer] ([employer]HS) estimates that more than 600,000 people will be able to get health care through [facility] Expansion.  Do you want to find out if you qualify? Do you want more information about how to apply? [facility] has partnered with the [facility] to give you resources:  [employer] news release about the Dec. 1 launch of Medicaid Expansion [place] Medicaid Expansion website Sign up to get updates from the [employer]HS Day 1 Flyer: An overview of who is eligible and how to enroll Family Planning Flyer: Information for the approximately 300,000 people who receive limited benefits through [facility] who will be automatically enrolled in full Medicaid Medicaid Essentials: A presentation about who is eligible and how to enroll FAQ: Answers to common questions ePass Video: An overview of how to apply online through ePass If you have additional questions, call [facility] Member & Recipient Services: [phone]    Call to schedule a follow-up appointment with both your primary care provider and mental health provider. Both mental health and medical provider's contact information is listed on discharge follow-up instructions.     As discussed,  your Seroquel was increase from Seroquel 50 mg once daily to twice daily. A new prescription has been sent to your pharmacy.
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
## 2022-10-01 NOTE — ED Notes (Signed)
This RN has communicated with case workers at this time via secure chat to attempt to obtain the updated plan of care.

## 2022-10-01 NOTE — ED Notes (Signed)
Attempted to call sister at this time. No response via phone call. Patient appears to be resting in bed comfortably at this time

## 2022-10-01 NOTE — ED Provider Notes (Signed)
Emergency Medicine Observation Re-evaluation Note  [name] is a 55 y.o. male, seen on rounds today.  Pt initially presented to the ED for complaints of IVC Currently, the patient is walking to the bathroom, looks comfortable.  Physical Exam  BP 131/80 (BP Location: Left Arm)   Pulse 77   Temp 98.2 F (36.8 C) (Oral)   Resp 18   Ht 5\' 3"  (1.6 m)   Wt 62 kg   SpO2 95%   BMI 24.21 kg/m  Physical Exam General: No distress Lungs: No respiratory distress Psych: Calm  ED Course / MDM  EKG:   I have reviewed the labs performed to date as well as medications administered while in observation.  Recent changes in the last 24 hours include -patient was just assessed by psychiatry team with a translator per the nursing staff.  They are going to work on disposition.  Plan  Current plan is for to get recommendations from behavioral health team in regards to management and disposition of the patient for today - hold vs. D/c.    [place], MD 10/01/22 [phone]
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
## 2022-10-01 NOTE — ED Notes (Signed)
IVC rescind paperwork faxed to clerk of court

## 2022-10-01 NOTE — Progress Notes (Signed)
TOC CSW has attempted to contact pts sister, [name] [phone] since 5:30pm to present.  CSW will continue to contact pts sister, [name].  [name], MSW, LCSW-A Pronouns:  She/Her/Hers [facility] Clinical Social Worker Direct Number:  [phone] [name]

## 2022-10-02 NOTE — ED Notes (Signed)
Patient ready for discharge,

## 2022-10-02 NOTE — ED Notes (Signed)
Spoke with SW here who stated they are going to be working today toward finding a place for this pt.

## 2022-10-02 NOTE — Telephone Encounter (Signed)
Left message on voicemail to return call.

## 2022-10-02 NOTE — ED Notes (Signed)
Pt's lunch has arrived, pt sat up and eating his lunch.

## 2022-10-02 NOTE — Social Work (Signed)
CSW has requested that the patient stay just for the night per the nurse concerns, Providers have agreed as it is cold and raining outside. TOC has given the patient resources he will have to contact those shelters in the AM for placement tomorrow or family and friends. The patient has a right to leave the hospital at his own will as there are no restraints to keep this patient in the ED. TOC will sign off, if other TOC needs Arise please put in a Consult. Patient is DC and has all neccessary resources. Sister also was yelling at the nurse because he is DC but is refusing to take him back at this time.
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
## 2022-10-02 NOTE — Telephone Encounter (Signed)
Called [name] back to give message from Dr. Laural Benes. Per [name], she states the SW stated that the patient did not need to come back home but she does not know where he is or has been transported to.   Informed that I did not see information in the chart about his location. Advised that she could call [facility] ED and was given phone number that was in the chart for [facility].  [phone]  Verbalized understanding.

## 2022-10-02 NOTE — Progress Notes (Signed)
CSW has reached out to the sister VIA phone with interpreter. N/A they left a message stating the the brother has been cleared and is ready for DC.

## 2022-10-02 NOTE — Progress Notes (Signed)
CSW has called the sister twice N/A TOC will update as it comes.
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
## 2022-10-02 NOTE — ED Notes (Signed)
Rescinded IVC according to patient chart

## 2022-10-02 NOTE — ED Notes (Signed)
Pt's dinner has arrived, pt stated they'd eat later in the night.

## 2022-10-02 NOTE — ED Notes (Signed)
Pt requested a coffee, Clinical research associate provided pt with a decaf coffee

## 2022-10-02 NOTE — ED Provider Notes (Signed)
Emergency Medicine Observation Re-evaluation Note  [name] is a 56 y.o. male, seen on rounds today.  Pt initially presented to the ED for complaints of IVC Currently, the patient is resting.  Physical Exam  BP 120/71 (BP Location: Right Arm)   Pulse 78   Temp 99 F (37.2 C) (Oral)   Resp 18   Ht 1.6 m (5\' 3" )   Wt 62 kg   SpO2 96%   BMI 24.21 kg/m  Physical Exam   ED Course / MDM  EKG:   I have reviewed the labs performed to date as well as medications administered while in observation.  Recent changes in the last 24 hours include none.  Plan  Current plan is for [facility] working with family for patient be discharged to them.    [facility], MD 10/02/22 0900

## 2022-10-02 NOTE — ED Notes (Signed)
Pt given 1000a snack, pt watching tv and eating morning snack
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
## 2022-10-02 NOTE — Progress Notes (Signed)
CSW will add resources at this time the patient is no being held under an IVC and has no legal guardian.

## 2022-10-02 NOTE — ED Notes (Addendum)
0813: Pt's breakfast arrived, pt walked to the bathroom then back to his room without any difficulty. Writer sat pt up to eat his breakfast and closed his door due to the hallway being currently loud at the moment. Blinds opened, pt in writer's view  [phone]: Pt finished his breakfast, door opened back up. Pt lying down and watching tv

## 2022-10-03 NOTE — ED Notes (Addendum)
After discussion with the social worker, MD, and charge nurse, Pt was able to be discharged from the facility and was guided to the waiting room. Discharge paperwork was reviewed with the pt with the translator device. Pt understood discharge instructions and was referred to extra resources in discharge paperwork. Pt was provided the phone numbers needed to get in touch with resources and was guided to the waiting room where he can call and utilize his resources.

## 2022-10-06 ENCOUNTER — Other Ambulatory Visit: Payer: Self-pay

## 2022-11-03 ENCOUNTER — Other Ambulatory Visit: Payer: Self-pay | Admitting: Family Medicine

## 2022-11-03 ENCOUNTER — Other Ambulatory Visit: Payer: Self-pay

## 2022-11-05 MED ORDER — QUETIAPINE FUMARATE 50 MG PO TABS
50.0000 mg | ORAL_TABLET | Freq: Two times a day (BID) | ORAL | 1 refills | Status: DC
  Filled 2022-11-05: qty 60, 30d supply, fill #0

## 2022-11-05 NOTE — Telephone Encounter (Signed)
Requesting refill

## 2022-11-06 ENCOUNTER — Other Ambulatory Visit: Payer: Self-pay

## 2022-11-12 ENCOUNTER — Other Ambulatory Visit: Payer: Self-pay

## 2022-11-12 ENCOUNTER — Ambulatory Visit: Payer: Self-pay | Attending: Internal Medicine | Admitting: Internal Medicine

## 2022-11-12 ENCOUNTER — Encounter: Payer: Self-pay | Admitting: Internal Medicine

## 2022-11-12 VITALS — BP 102/66 | HR 80 | Temp 98.0°F | Ht 63.0 in | Wt 136.0 lb

## 2022-11-12 DIAGNOSIS — Z1211 Encounter for screening for malignant neoplasm of colon: Secondary | ICD-10-CM

## 2022-11-12 DIAGNOSIS — Z730 Burn-out: Secondary | ICD-10-CM

## 2022-11-12 DIAGNOSIS — R809 Proteinuria, unspecified: Secondary | ICD-10-CM

## 2022-11-12 DIAGNOSIS — D649 Anemia, unspecified: Secondary | ICD-10-CM

## 2022-11-12 DIAGNOSIS — E1129 Type 2 diabetes mellitus with other diabetic kidney complication: Secondary | ICD-10-CM

## 2022-11-12 DIAGNOSIS — R269 Unspecified abnormalities of gait and mobility: Secondary | ICD-10-CM

## 2022-11-12 DIAGNOSIS — R4689 Other symptoms and signs involving appearance and behavior: Secondary | ICD-10-CM

## 2022-11-12 DIAGNOSIS — G40909 Epilepsy, unspecified, not intractable, without status epilepticus: Secondary | ICD-10-CM
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
## 2022-11-13 LAB — CBC
Hematocrit: 37.8 % (ref 37.5–51.0)
Hemoglobin: 12.7 g/dL — ABNORMAL LOW (ref 13.0–17.7)
MCH: 29.8 pg (ref 26.6–33.0)
MCHC: 33.6 g/dL (ref 31.5–35.7)
MCV: 89 fL (ref 79–97)
Platelets: 242 10*3/uL (ref 150–450)
RBC: 4.26 x10E6/uL (ref 4.14–5.80)
RDW: 12.2 % (ref 11.6–15.4)
WBC: 5.4 10*3/uL (ref 3.4–10.8)

## 2022-11-13 LAB — MICROALBUMIN / CREATININE URINE RATIO
Creatinine, Urine: 106.2 mg/dL
Microalb/Creat Ratio: 155 mg/g creat — ABNORMAL HIGH (ref 0–29)
Microalbumin, Urine: 164.3 ug/mL

## 2022-11-13 LAB — IRON,TIBC AND FERRITIN PANEL
Ferritin: 72 ng/mL (ref 30–400)
Iron Saturation: 29 % (ref 15–55)
Iron: 92 ug/dL (ref 38–169)
Total Iron Binding Capacity: 313 ug/dL (ref 250–450)
UIBC: 221 ug/dL (ref 111–343)

## 2022-11-13 NOTE — Progress Notes (Signed)
Thanks  No problem

## 2022-11-14 LAB — FECAL OCCULT BLOOD, IMMUNOCHEMICAL: Fecal Occult Bld: NEGATIVE

## 2022-11-15 ENCOUNTER — Telehealth: Payer: Self-pay | Admitting: Internal Medicine

## 2022-11-15 NOTE — Telephone Encounter (Signed)
Phone call placed to patient's Sister [name] this afternoon.  I left a message on her voicemail informing of who I am.  Informed her that I forgot to include a diagnosis on his FL2 form that I completed a few days ago.  Advised to bring the form back if she has not turned it in as yet so that I can include the diagnosis. Also informed that he still has protein in the urine.  Make sure he is taking the Lisinopril.  Addendum:  11/16/2022:  pt's sister did bring the FL2 form back and I was able to add dx of Traumatic brain injury.

## 2022-12-29 ENCOUNTER — Other Ambulatory Visit (HOSPITAL_COMMUNITY): Payer: Self-pay | Admitting: Psychiatry

## 2022-12-29 ENCOUNTER — Other Ambulatory Visit: Payer: Self-pay

## 2022-12-29 ENCOUNTER — Other Ambulatory Visit: Payer: Self-pay | Admitting: Internal Medicine

## 2022-12-29 DIAGNOSIS — F3341 Major depressive disorder, recurrent, in partial remission: Secondary | ICD-10-CM

## 2022-12-29 DIAGNOSIS — E119 Type 2 diabetes mellitus without complications: Secondary | ICD-10-CM

## 2022-12-30 ENCOUNTER — Other Ambulatory Visit: Payer: Self-pay

## 2022-12-30 MED ORDER — FLUOXETINE HCL 10 MG PO CAPS
30.0000 mg | ORAL_CAPSULE | Freq: Every day | ORAL | 3 refills | Status: DC
  Filled 2022-12-30: qty 90, 30d supply, fill #0

## 2022-12-30 MED ORDER — ATORVASTATIN CALCIUM 10 MG PO TABS
10.0000 mg | ORAL_TABLET | Freq: Every day | ORAL | 2 refills | Status: DC
  Filled 2022-12-30: qty 30, 30d supply, fill #0

## 2022-12-30 MED ORDER — METFORMIN HCL 1000 MG PO TABS
1000.0000 mg | ORAL_TABLET | Freq: Two times a day (BID) | ORAL | 0 refills | Status: DC
  Filled 2022-12-30: qty 60, 30d supply, fill #0

## 2022-12-31 ENCOUNTER — Other Ambulatory Visit: Payer: Self-pay

## 2023-01-05 ENCOUNTER — Other Ambulatory Visit: Payer: Self-pay

## 2023-02-18 ENCOUNTER — Encounter: Payer: Self-pay | Admitting: Neurology

## 2023-02-18 ENCOUNTER — Ambulatory Visit (INDEPENDENT_AMBULATORY_CARE_PROVIDER_SITE_OTHER): Payer: Self-pay | Admitting: Neurology

## 2023-02-18 ENCOUNTER — Other Ambulatory Visit: Payer: Self-pay | Admitting: Internal Medicine

## 2023-02-18 ENCOUNTER — Other Ambulatory Visit: Payer: Self-pay

## 2023-02-18 VITALS — BP 113/67 | HR 91 | Ht 62.0 in | Wt 130.0 lb

## 2023-02-18 DIAGNOSIS — Z87820 Personal history of traumatic brain injury: Secondary | ICD-10-CM

## 2023-02-18 DIAGNOSIS — R4689 Other symptoms and signs involving appearance and behavior: Secondary | ICD-10-CM

## 2023-02-18 DIAGNOSIS — E1129 Type 2 diabetes mellitus with other diabetic kidney complication: Secondary | ICD-10-CM

## 2023-02-18 DIAGNOSIS — G40209 Localization-related (focal) (partial) symptomatic epilepsy and epileptic syndromes with complex partial seizures, not intractable, without status epilepticus: Secondary | ICD-10-CM

## 2023-02-18 DIAGNOSIS — I10 Essential (primary) hypertension: Secondary | ICD-10-CM

## 2023-02-18 MED ORDER — LEVETIRACETAM 500 MG PO TABS
ORAL_TABLET | Freq: Two times a day (BID) | ORAL | 3 refills | Status: DC
  Filled 2023-02-18: qty 270, 90d supply, fill #0

## 2023-02-18 MED ORDER — LISINOPRIL 2.5 MG PO TABS
2.5000 mg | ORAL_TABLET | Freq: Every day | ORAL | 0 refills | Status: DC
Start: 2023-02-18 — End: 2023-03-11
  Filled 2023-02-18: qty 90, 90d supply, fill #0

## 2023-02-18 MED ORDER — RISPERIDONE 0.25 MG PO TABS
0.2500 mg | ORAL_TABLET | Freq: Every day | ORAL | 3 refills | Status: AC
  Filled 2023-02-18: qty 90, 90d supply, fill #0
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
## 2023-02-18 NOTE — Patient Instructions (Addendum)
Que bueno verte.  1. Comience con Risperidona 0,25 mg: tome 1 tableta cada noche  2. Continuar con Levetiracetam (Keppra) 500 mg: tomar 1 y 1/2 comprimidos dos veces al da.  3. Contine con todos sus otros medicamentos.  4. Seguimiento a los 6 meses, llamar para cualquier cambio.    Good to see you.  Start Risperidone 0.25mg : Take 1 tablet every night  2. Continue Levetiracetam (Keppra) : take 1 and 1/2 tablets twice a day  3. Continue all your other medications  4. Follow-up in 6 months, call for any changes.

## 2023-02-19 ENCOUNTER — Other Ambulatory Visit: Payer: Self-pay

## 2023-03-11 ENCOUNTER — Ambulatory Visit: Payer: Self-pay | Attending: Internal Medicine | Admitting: Internal Medicine

## 2023-03-11 ENCOUNTER — Other Ambulatory Visit: Payer: Self-pay

## 2023-03-11 ENCOUNTER — Encounter: Payer: Self-pay | Admitting: Internal Medicine

## 2023-03-11 ENCOUNTER — Telehealth: Payer: Self-pay

## 2023-03-11 VITALS — BP 109/63 | HR 80 | Temp 97.6°F | Ht 62.0 in | Wt 131.0 lb

## 2023-03-11 DIAGNOSIS — Z87820 Personal history of traumatic brain injury: Secondary | ICD-10-CM

## 2023-03-11 DIAGNOSIS — Z7984 Long term (current) use of oral hypoglycemic drugs: Secondary | ICD-10-CM

## 2023-03-11 DIAGNOSIS — I10 Essential (primary) hypertension: Secondary | ICD-10-CM

## 2023-03-11 DIAGNOSIS — R1110 Vomiting, unspecified: Secondary | ICD-10-CM

## 2023-03-11 DIAGNOSIS — F3341 Major depressive disorder, recurrent, in partial remission: Secondary | ICD-10-CM

## 2023-03-11 DIAGNOSIS — G40909 Epilepsy, unspecified, not intractable, without status epilepticus: Secondary | ICD-10-CM

## 2023-03-11 DIAGNOSIS — F919 Conduct disorder, unspecified: Secondary | ICD-10-CM

## 2023-03-11 DIAGNOSIS — R809 Proteinuria, unspecified: Secondary | ICD-10-CM

## 2023-03-11 DIAGNOSIS — Z9181 History of falling: Secondary | ICD-10-CM

## 2023-03-11 DIAGNOSIS — E1129 Type 2 diabetes mellitus with other diabetic kidney complication: Secondary | ICD-10-CM

## 2023-03-11 LAB — POCT GLYCOSYLATED HEMOGLOBIN (HGB A1C): HbA1c, POC (controlled diabetic range): 5.2 % (ref 0.0–7.0)

## 2023-03-11 LAB — GLUCOSE, POCT (MANUAL RESULT ENTRY): POC Glucose: 101 mg/dl — AB (ref 70–99)

## 2023-03-11 MED ORDER — METFORMIN HCL 1000 MG PO TABS
1000.0000 mg | ORAL_TABLET | Freq: Two times a day (BID) | ORAL | 3 refills | Status: AC
Start: 2023-03-11 — End: ?
  Filled 2023-03-11 – 2023-03-24 (×2): qty 60, 30d supply, fill #0
  Filled 2023-05-04: qty 60, 30d supply, fill #1

## 2023-03-11 MED ORDER — LEVETIRACETAM 500 MG PO TABS
ORAL_TABLET | Freq: Two times a day (BID) | ORAL | 3 refills | Status: DC
  Filled 2023-03-11: qty 270, fill #0

## 2023-03-11 MED ORDER — LEVETIRACETAM 500 MG PO TABS
ORAL_TABLET | Freq: Two times a day (BID) | ORAL | 3 refills | Status: AC
Start: 2023-03-11 — End: ?
  Filled 2023-03-11: qty 270, fill #0

## 2023-03-11 MED ORDER — FLUOXETINE HCL 10 MG PO CAPS
30.0000 mg | ORAL_CAPSULE | Freq: Every day | ORAL | 3 refills | Status: AC
Start: 2023-03-11 — End: ?
  Filled 2023-03-11 – 2023-03-24 (×2): qty 90, 30d supply, fill #0
  Filled 2023-05-04: qty 90, 30d supply, fill #1

## 2023-03-11 MED ORDER — ATORVASTATIN CALCIUM 10 MG PO TABS
10.0000 mg | ORAL_TABLET | Freq: Every day | ORAL | 2 refills | Status: AC
Start: 2023-03-11 — End: ?
  Filled 2023-03-11 – 2023-03-24 (×2): qty 30, 30d supply, fill #0
  Filled 2023-05-04: qty 30, 30d supply, fill #1

## 2023-03-11 MED ORDER — LISINOPRIL 2.5 MG PO TABS
2.5000 mg | ORAL_TABLET | Freq: Every day | ORAL | 1 refills | Status: DC
Start: 2023-03-11 — End: 2023-09-09
  Filled 2023-03-11: qty 90, 90d supply, fill #0

## 2023-03-11 NOTE — Patient Instructions (Signed)
Patient is on the fourth floor to the DSS office to see whether he would qualify for Medicaid. We will order the study to check to see if he has diabetic gastroparesis.  I suggest leaving a limited amount of food out during the day so that he does not overeat. I will have our case worker touch base with you to see if she can assist with getting through to DSS regarding having him placed.

## 2023-03-11 NOTE — Telephone Encounter (Signed)
At request of Dr Laural Benes, I provided the patient with a RW.   I also encouraged his sister to speak to the DSS Medicaid eligibility caseworkers on the 4th floor of our building. She said she is taking her brother to get something to eat and will come back. I offered to have the caseworker call her but she said she would rather speak to her in person and she will come back.

## 2023-03-11 NOTE — Progress Notes (Signed)
Patient ID: [name], male    DOB: 07/02/1966  MRN: [id number]  CC: Diabetes (DM f/u. Med refills - metformin)   Subjective: [name] is a 57 y.o. male who presents for chronic ds management.  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   [employer] Language interpreter used during this encounter. #[id number] Cradle  On last visit, patient reportedly was displaying some aggressive behavior towards family.  He was no longer living with his sister but living with a close friend [name].  No further aggressive behaviors.  Continues to have falls at times due to gait disturbance.  Patient denies having falls but friend and sister are able to tell because he sometimes has scrapes on his extremities. -We completed [place] 2 form.  They have been working to try to get him placed.  Pt still with [name] but is alone during the day as [name] works.  Sister has sent all info to SW at DSS.  She has tried calling SW several times but she has not return call -has had 3 episodes of vomiting over past 2 mths.  Pt does not complain of any related symptoms to me or his friend. Friends state he over eats during the day when he is alone; not sure if this contributed to the vomiting.  1st episode of vomiting occurred before he was started on new med risperidone by neurology.  He is on Prozac for depression. Saw Dr. Karel Jarvis last month.  She started him on low-dose Risperdal 0.25 mg at bedtime to see if it would help decrease behavioral issues. The med has helped some in calming him down; not wondering outside house -sister sets up his med box.  Some days he forgets to take some of his meds -No recent seizures.  He remains on Keppra.  Anemia: On  last visit we rechecked his CBC.  H&H had improved to 12.7/37.8.  Fit test was negative.  DM: Results for orders placed or performed in visit on 03/11/23  POCT glucose (manual entry)  Result Value Ref Range   POC Glucose 101 (A) 70 - 99 mg/dl  POCT glycosylated hemoglobin (Hb A1C)  Result Value Ref Range   Hemoglobin A1C     HbA1c POC (<> result, manual entry)     HbA1c, POC (prediabetic range)     HbA1c, POC (controlled diabetic range) 5.2 0.0 - 7.0 %  He remains on metformin.  Blood pressure today looks good.  He is on lisinopril for blood pressure and microalbumin urea.   Patient Active Problem List   Diagnosis Date Noted   Burnout of caregiver 07/09/2022   Aggressive behavior of adult 07/09/2022   Gastric polyp    Gastritis and gastroduodenitis    History of traumatic brain injury 05/29/2021   MDD (major depressive disorder), recurrent, in partial remission (HCC) 11/12/2020   MDD (major depressive disorder), recurrent episode, moderate (HCC) 07/16/2020   Influenza vaccine needed 07/14/2019   Microalbuminuria due to type 2 diabetes mellitus (HCC) 04/11/2019   Hypothyroidism 09/21/2018   Seizure disorder (HCC) 06/16/2018   Low testosterone 06/16/2018   Traumatic brain injury with loss of consciousness (HCC) 10/29/2017   Anemia, chronic disease 10/29/2017   Positive depression screening 10/29/2017   Attention  and concentration deficit    Type 2 diabetes mellitus with peripheral neuropathy (HCC)    S/P craniotomy 03/19/2017   Essential hypertension    Dysphagia, oropharyngeal 02/01/2017   Diabetes type 2, uncontrolled 06/13/2015     Current Outpatient Medications on File Prior to Visit  Medication Sig Dispense Refill   atorvastatin (LIPITOR) 10 MG tablet Take 1 tablet (10 mg total) by mouth daily. 30 tablet 2   ferrous sulfate 324 MG TBEC Take 324 mg by mouth.     FLUoxetine (PROZAC) 10 MG capsule Take 3 capsules (30 mg total) by mouth daily. 90 capsule 3   hydrOXYzine  (ATARAX) 10 MG tablet Take 1 tablet (10 mg total) by mouth 3 (three) times daily as needed. 90 tablet 3   levETIRAcetam (KEPPRA) 500 MG tablet Take 1.5 tablets (750mg ) by mouth twice daily. 270 tablet 3   lisinopril (ZESTRIL) 2.5 MG tablet Take 1 tablet (2.5 mg total) by mouth daily. 90 tablet 0   risperiDONE (RISPERDAL) 0.25 MG tablet Take 1 tablet (0.25 mg total) by mouth at bedtime. 90 tablet 3   vitamin B-12 (CYANOCOBALAMIN) 100 MCG tablet Take 100 mcg by mouth daily.     metFORMIN (GLUCOPHAGE) 1000 MG tablet Take 1 tablet (1,000 mg total) by mouth 2 (two) times daily with a meal. 60 tablet 0   Current Facility-Administered Medications on File Prior to Visit  Medication Dose Route Frequency Provider Last Rate Last Admin   0.9 %  sodium chloride infusion  500 mL Intravenous Once Nandigam, Eleonore Chiquito, MD        Allergies  Allergen Reactions   Latex Itching and Rash    Social History   Socioeconomic History   Marital status: Single    Spouse name: Not on file   Number of children: 1   Years of education: 12   Highest education level: High school graduate  Occupational History   Occupation: not working  Tobacco Use   Smoking status: Never   Smokeless tobacco: Never  Vaping Use   Vaping Use: Never used  Substance and Sexual Activity   Alcohol use: Not Currently    Comment: socially, not since accident   Drug use: No   Sexual activity: Not on file  Other Topics Concern   Not on file  Social History Narrative   Lives with sister and her family.  Has one daughter in [place].  Does not work.  Education: high school.    Right handed    Social Determinants of Health   Financial Resource Strain: Not on file  Food Insecurity: Not on file  Transportation Needs: Not on file  Physical Activity: Not on file  Stress: Not on file  Social Connections: Not on file  Intimate Partner Violence: Not on file    Family History  Problem Relation Age of Onset   Diabetes Mother     Diabetes Father     Past Surgical History:  Procedure Laterality Date   BIOPSY  08/19/2021   Procedure: BIOPSY;  Surgeon: Benancio Deeds, MD;  Location: [facility];  Service: Gastroenterology;;   CRANIOTOMY Right 01/13/2017   Procedure: 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, Placement of bone flap in abdomen;  Surgeon: Coletta Memos, MD;  Location: [facility] OR;  Service: Neurosurgery;  Laterality: Right;   CRANIOTOMY Right 03/19/2017   Procedure: Cranioplasty with placement of bone flap from abdomen;  Surgeon: Tia Alert, MD;  Location[facility] OR;  Service: Neurosurgery;  Laterality: Right;  ESOPHAGOGASTRODUODENOSCOPY (EGD) WITH PROPOFOL N/A 08/19/2021   Procedure: ESOPHAGOGASTRODUODENOSCOPY (EGD) WITH PROPOFOL;  Surgeon: Benancio Deeds, MD;  Location: [facility];  Service: Gastroenterology;  Laterality: N/A;   POLYPECTOMY  08/19/2021   Procedure: POLYPECTOMY;  Surgeon: Benancio Deeds, MD;  Location: [facility];  Service: Gastroenterology;;   Gaspar Bidding DILATION N/A 08/19/2021   Procedure: Gaspar Bidding DILATION;  Surgeon: Benancio Deeds, MD;  Location: [facility];  Service: Gastroenterology;  Laterality: N/A;    ROS: Review of Systems Negative except as stated above  PHYSICAL EXAM: BP 109/63 (BP Location: Left Arm, Patient Position: Sitting, Cuff Size: Normal)   Pulse 80   Temp 97.6 F (36.4 C) (Oral)   Ht 5\' 2"  (1.575 m)   Wt 131 lb (59.4 kg)   SpO2 100%   BMI 23.96 kg/m   Wt Readings from Last 3 Encounters:  03/11/23 131 lb (59.4 kg)  02/18/23 130 lb (59 kg)  11/12/22 136 lb (61.7 kg)    Physical Exam   General appearance -patient is in no acute distress.  Clothing is clean. Mental status -he knows that today is Thursday but does not know month or year. Chest - clear to auscultation, no wheezes, rales or rhonchi, symmetric air entry Heart - normal rate, regular rhythm, normal S1, S2, no murmurs, rubs, clicks or gallops Abdomen  - soft, nontender, nondistended, no masses or organomegaly Extremities - peripheral pulses normal, no pedal edema, no clubbing or cyanosis Gait is wide-based and slow.    Latest Ref Rng & Units 09/29/2022   12:09 AM 03/26/2022   10:05 PM 01/23/2021    4:34 PM  CMP  Glucose 70 - 99 mg/dL 578  469    BUN 6 - 20 mg/dL 29  32    Creatinine 6.29 - 1.24 mg/dL 5.28  4.13    Sodium 244 - 145 mmol/L 137  141    Potassium 3.5 - 5.1 mmol/L 4.1  3.8    Chloride 98 - 111 mmol/L 103  109    CO2 22 - 32 mmol/L 24  28    Calcium 8.9 - 10.3 mg/dL 9.0  9.1    Total Protein 6.5 - 8.1 g/dL 6.7  7.2  6.7   Total Bilirubin 0.3 - 1.2 mg/dL 0.5  0.5  0.3   Alkaline Phos 38 - 126 U/L 39  42  56   AST 15 - 41 U/L 21  19  19    ALT 0 - 44 U/L 25  22  21     Lipid Panel     Component Value Date/Time   CHOL 184 01/23/2021 1634   TRIG 103 01/23/2021 1634   HDL 67 01/23/2021 1634   CHOLHDL 2.7 01/23/2021 1634   CHOLHDL 5.2 (H) 06/02/2016 1232   VLDL NOT CALC 06/02/2016 1232   LDLCALC 99 01/23/2021 1634    CBC    Component Value Date/Time   WBC 5.4 11/12/2022 1017   WBC 7.7 09/29/2022 0009   RBC 4.26 11/12/2022 1017   RBC 3.05 (L) 09/29/2022 0009   HGB 12.7 (L) 11/12/2022 1017   HCT 37.8 11/12/2022 1017   PLT 242 11/12/2022 1017   MCV 89 11/12/2022 1017   MCH 29.8 11/12/2022 1017   MCH 31.1 09/29/2022 0009   MCHC 33.6 11/12/2022 1017   MCHC 34.1 09/29/2022 0009   RDW 12.2 11/12/2022 1017   LYMPHSABS 0.6 (L) 09/29/2022 0009   LYMPHSABS 1.4 04/01/2017 1444   MONOABS 0.4 09/29/2022 0009   EOSABS 0.0  09/29/2022 0009   EOSABS 0.1 04/01/2017 1444   BASOSABS 0.0 09/29/2022 0009   BASOSABS 0.0 04/01/2017 1444    ASSESSMENT AND PLAN: 1. Type 2 diabetes mellitus with microalbuminuria, without long-term current use of insulin (HCC) At goal.  Continue metformin - atorvastatin (LIPITOR) 10 MG tablet; Take 1 tablet (10 mg total) by mouth daily.  Dispense: 30 tablet; Refill: 2 - POCT glucose (manual  entry) - POCT glycosylated hemoglobin (Hb A1C) - metFORMIN (GLUCOPHAGE) 1000 MG tablet; Take 1 tablet (1,000 mg total) by mouth 2 (two) times daily with a meal.  Dispense: 60 tablet; Refill: 3  2. Seizure disorder (HCC) Stable on current dose of Keppra. - levETIRAcetam (KEPPRA) 500 MG tablet; Take 1.5 tablets (750mg ) by mouth twice daily.  Dispense: 270 tablet; Refill: 3  3. History of traumatic brain injury 4. Behavior disturbance Will have case worker touch base with sister to see if she can assist with getting through to DSS.  Also encouraged her to have him apply for Medicaid to see whether he qualifies.  5. Essential hypertension Controlled.  Continue lisinopril - lisinopril (ZESTRIL) 2.5 MG tablet; Take 1 tablet (2.5 mg total) by mouth daily.  Dispense: 90 tablet; Refill: 1  6. Recurrent vomiting Will check gastric emptying study to rule out gastroparesis.  Risperdal can sometimes cause vomiting in about 4% of patients.  However his friend stated that this started before he was started on the Risperdal. Advised this done friend to leave out his meals for breakfast lunch and dinner in the refrigerator.  They may consider locking the cupboards during the day so that he does not overeat. - Lipase - Hepatic function panel - NM Gastric Emptying; Future  7. MDD (major depressive disorder), recurrent, in partial remission (HCC) Continue Prozac.  Refill given - FLUoxetine (PROZAC) 10 MG capsule; Take 3 capsules (30 mg total) by mouth daily.  Dispense: 90 capsule; Refill: 3  8. Microalbuminuria due to type 2 diabetes mellitus (HCC) - lisinopril (ZESTRIL) 2.5 MG tablet; Take 1 tablet (2.5 mg total) by mouth daily.  Dispense: 90 tablet; Refill: 1  9. History of recent fall -I had our caseworker give him a walker today to see if this would assist and help prevent falls. - Vitamin B12      Patient was given the opportunity to ask questions.  Patient verbalized understanding of the plan  and was able to repeat key elements of the plan.   This documentation was completed using Paediatric nurse.  Any transcriptional errors are unintentional.  Orders Placed This Encounter  Procedures   POCT glucose (manual entry)   POCT glycosylated hemoglobin (Hb A1C)     Requested Prescriptions   Pending Prescriptions Disp Refills   atorvastatin (LIPITOR) 10 MG tablet 30 tablet 2    Sig: Take 1 tablet (10 mg total) by mouth daily.    No follow-ups on file.  Jonah Blue, MD, FACP

## 2023-03-12 LAB — VITAMIN B12: Vitamin B-12: 1003 pg/mL (ref 232–1245)

## 2023-03-12 LAB — HEPATIC FUNCTION PANEL
ALT: 15 IU/L (ref 0–44)
AST: 15 IU/L (ref 0–40)
Albumin: 4 g/dL (ref 3.8–4.9)
Alkaline Phosphatase: 53 IU/L (ref 44–121)
Bilirubin Total: 0.3 mg/dL (ref 0.0–1.2)
Bilirubin, Direct: <0.1 mg/dL (ref 0.00–0.40)
Total Protein: 6.4 g/dL (ref 6.0–8.5)

## 2023-03-12 LAB — LIPASE: Lipase: 51 U/L (ref 13–78)

## 2023-03-18 ENCOUNTER — Other Ambulatory Visit: Payer: Self-pay

## 2023-03-19 ENCOUNTER — Ambulatory Visit: Payer: Self-pay

## 2023-03-24 ENCOUNTER — Other Ambulatory Visit: Payer: Self-pay

## 2023-03-24 ENCOUNTER — Ambulatory Visit: Payer: Self-pay | Attending: Internal Medicine

## 2023-03-25 ENCOUNTER — Telehealth: Payer: Self-pay

## 2023-03-25 NOTE — Telephone Encounter (Signed)
Called & LVM for the sister. Patient has a Gastric Emptying study scheduled for 04/02/2023 at 7:30 a.m. at [facility]. Patient will need to use entrance A. Please inform no food or drink after 12 midnight the day before.

## 2023-03-25 NOTE — Telephone Encounter (Signed)
Pt's sister called back asking to speak to Dr. Laural Benes or her nurse.   CB#  [phone]

## 2023-03-26 NOTE — Telephone Encounter (Signed)
Called & spoke to [place], the patient's sister, and informed of upcoming appointment for a gastric emptying study. Advised that appointment is for 04/02/2023 at [facility] and will need to use entrance A. Informed no food or drinks after 12 midnight the night before. [name] expressed verbal understanding. No further questions at this time.

## 2023-04-02 ENCOUNTER — Encounter (HOSPITAL_COMMUNITY)
Admission: RE | Admit: 2023-04-02 | Discharge: 2023-04-02 | Disposition: A | Discharge status: Home / Self Care | Payer: Self-pay | Source: Ambulatory Visit | Attending: Internal Medicine | Admitting: Internal Medicine

## 2023-04-02 DIAGNOSIS — R1110 Vomiting, unspecified: Secondary | ICD-10-CM

## 2023-04-02 MED ORDER — TECHNETIUM TC 99M SULFUR COLLOID
2.0000 | Freq: Once | INTRAVENOUS | Status: AC | PRN
  Administered 2023-04-02: 2 via ORAL

## 2023-04-07 ENCOUNTER — Telehealth: Payer: Self-pay | Admitting: Licensed Clinical Social Worker

## 2023-04-07 NOTE — Telephone Encounter (Signed)
Spoke with pts sister today. She stated that they are trying to find care for her brother who is not safe to live with them but can not live alone due too some mental health issue from a brain injury. Sister was driving so she could not talk. She wanted me to give her LCSWA email so she could email me documents of where they are with helping her brother.

## 2023-04-27 ENCOUNTER — Telehealth: Payer: Self-pay | Admitting: Internal Medicine

## 2023-04-27 NOTE — Telephone Encounter (Signed)
Maralyn Sago called from [facility] per patient has become very combative and can no longer live with his sister as she has small children. Patient is living with a friend which is not a good living situation and sister wants him placed in a facility. Maralyn Sago is asking that his FL2 be changed from Home Health to Skilled Nursing. Please contact Sarah at [phone].

## 2023-04-28 NOTE — Telephone Encounter (Signed)
Call returned to [employer] : [phone], message left with call back requested.

## 2023-04-29 NOTE — Telephone Encounter (Signed)
I spoke to Maralyn Sago, LCSWA/ [employer]  She explained that she is working with patient's sister, [name], to address the patient's care needs.  Maralyn Sago said that the patient was living with [name] but he started becoming aggressive and less independent. She was concerned for her family's safety and the patient is now living with a "friend."  [name] checks on him daily but has reported concerns about the care that the patient is receiving from that friend.  [name] does not think he is being abused, neglected at this time but she is not sure how long her brother can stay with the friend.  She also has concerns about the cleanliness of the home. [name] would like to have her brother placed in a facility; but she is not POA or guardian.   Maralyn Sago said there are many barriers to placing him in a facility.  He has no insurance/income and his behaviors may not be accepted at most facilities. I told her that we can do an FL2 but would need to have a facility that is willing to consider accepting him.   I suggested that the patient's sister call APS and file a report. She has first hand information/concerns.  APS may be able to assist with guardianship and will also be able to assess the living situation in the home where her brother is staying.  Maralyn Sago said she is meeting with [name] next week and will call APS with her.   Maralyn Sago will also contact [employer] [place] about guardianship.  Per Maralyn Sago, both the patient and his sister are not here legally.

## 2023-05-04 ENCOUNTER — Other Ambulatory Visit: Payer: Self-pay

## 2023-05-26 ENCOUNTER — Emergency Department (HOSPITAL_COMMUNITY): Payer: Self-pay

## 2023-05-26 ENCOUNTER — Inpatient Hospital Stay (HOSPITAL_COMMUNITY)
Admission: EM | Admit: 2023-05-26 | Discharge: 2023-09-09 | DRG: 574 | Disposition: A | Discharge status: Nursing Home (Includes ICF) | Payer: Self-pay | Attending: Family Medicine | Admitting: Family Medicine

## 2023-05-26 ENCOUNTER — Encounter (HOSPITAL_COMMUNITY): Payer: Self-pay | Admitting: Family Medicine

## 2023-05-26 DIAGNOSIS — E1152 Type 2 diabetes mellitus with diabetic peripheral angiopathy with gangrene: Secondary | ICD-10-CM | POA: Diagnosis present

## 2023-05-26 DIAGNOSIS — Z9889 Other specified postprocedural states: Secondary | ICD-10-CM

## 2023-05-26 DIAGNOSIS — K219 Gastro-esophageal reflux disease without esophagitis: Secondary | ICD-10-CM | POA: Diagnosis present

## 2023-05-26 DIAGNOSIS — I10 Essential (primary) hypertension: Secondary | ICD-10-CM | POA: Diagnosis present

## 2023-05-26 DIAGNOSIS — W06XXXA Fall from bed, initial encounter: Secondary | ICD-10-CM | POA: Diagnosis not present

## 2023-05-26 DIAGNOSIS — Z794 Long term (current) use of insulin: Secondary | ICD-10-CM

## 2023-05-26 DIAGNOSIS — F331 Major depressive disorder, recurrent, moderate: Secondary | ICD-10-CM | POA: Diagnosis present

## 2023-05-26 DIAGNOSIS — L97529 Non-pressure chronic ulcer of other part of left foot with unspecified severity: Secondary | ICD-10-CM | POA: Diagnosis present

## 2023-05-26 DIAGNOSIS — Z87820 Personal history of traumatic brain injury: Secondary | ICD-10-CM

## 2023-05-26 DIAGNOSIS — Z7984 Long term (current) use of oral hypoglycemic drugs: Secondary | ICD-10-CM

## 2023-05-26 DIAGNOSIS — M869 Osteomyelitis, unspecified: Secondary | ICD-10-CM | POA: Diagnosis present

## 2023-05-26 DIAGNOSIS — E785 Hyperlipidemia, unspecified: Secondary | ICD-10-CM | POA: Diagnosis present

## 2023-05-26 DIAGNOSIS — G8194 Hemiplegia, unspecified affecting left nondominant side: Secondary | ICD-10-CM | POA: Diagnosis present

## 2023-05-26 DIAGNOSIS — Y92239 Unspecified place in hospital as the place of occurrence of the external cause: Secondary | ICD-10-CM | POA: Diagnosis not present

## 2023-05-26 DIAGNOSIS — L02612 Cutaneous abscess of left foot: Secondary | ICD-10-CM | POA: Diagnosis present

## 2023-05-26 DIAGNOSIS — L03116 Cellulitis of left lower limb: Principal | ICD-10-CM | POA: Diagnosis present

## 2023-05-26 DIAGNOSIS — E1169 Type 2 diabetes mellitus with other specified complication: Secondary | ICD-10-CM | POA: Diagnosis present

## 2023-05-26 DIAGNOSIS — I96 Gangrene, not elsewhere classified: Secondary | ICD-10-CM | POA: Diagnosis present

## 2023-05-26 DIAGNOSIS — Z6822 Body mass index (BMI) 22.0-22.9, adult: Secondary | ICD-10-CM

## 2023-05-26 DIAGNOSIS — G40909 Epilepsy, unspecified, not intractable, without status epilepticus: Secondary | ICD-10-CM | POA: Diagnosis present

## 2023-05-26 DIAGNOSIS — R4189 Other symptoms and signs involving cognitive functions and awareness: Secondary | ICD-10-CM | POA: Diagnosis present

## 2023-05-26 DIAGNOSIS — E871 Hypo-osmolality and hyponatremia: Secondary | ICD-10-CM | POA: Diagnosis not present

## 2023-05-26 DIAGNOSIS — D638 Anemia in other chronic diseases classified elsewhere: Secondary | ICD-10-CM | POA: Diagnosis present

## 2023-05-26 DIAGNOSIS — E1142 Type 2 diabetes mellitus with diabetic polyneuropathy: Secondary | ICD-10-CM | POA: Diagnosis present

## 2023-05-26 DIAGNOSIS — E11621 Type 2 diabetes mellitus with foot ulcer: Secondary | ICD-10-CM | POA: Diagnosis present

## 2023-05-26 DIAGNOSIS — Z1152 Encounter for screening for COVID-19: Secondary | ICD-10-CM

## 2023-05-26 DIAGNOSIS — Z91040 Latex allergy status: Secondary | ICD-10-CM

## 2023-05-26 DIAGNOSIS — K0889 Other specified disorders of teeth and supporting structures: Secondary | ICD-10-CM | POA: Diagnosis present

## 2023-05-26 DIAGNOSIS — R630 Anorexia: Secondary | ICD-10-CM | POA: Diagnosis present

## 2023-05-26 DIAGNOSIS — E1165 Type 2 diabetes mellitus with hyperglycemia: Secondary | ICD-10-CM | POA: Diagnosis present

## 2023-05-26 DIAGNOSIS — E43 Unspecified severe protein-calorie malnutrition: Secondary | ICD-10-CM | POA: Diagnosis present

## 2023-05-26 DIAGNOSIS — K000 Anodontia: Secondary | ICD-10-CM | POA: Diagnosis present

## 2023-05-26 DIAGNOSIS — I739 Peripheral vascular disease, unspecified: Secondary | ICD-10-CM | POA: Diagnosis present

## 2023-05-26 DIAGNOSIS — Z833 Family history of diabetes mellitus: Secondary | ICD-10-CM

## 2023-05-26 DIAGNOSIS — Z603 Acculturation difficulty: Secondary | ICD-10-CM | POA: Diagnosis present

## 2023-05-26 DIAGNOSIS — Z79899 Other long term (current) drug therapy: Secondary | ICD-10-CM

## 2023-05-26 DIAGNOSIS — M609 Myositis, unspecified: Secondary | ICD-10-CM | POA: Diagnosis present

## 2023-05-26 LAB — CBC WITH DIFFERENTIAL/PLATELET
Abs Immature Granulocytes: 0.06 10*3/uL (ref 0.00–0.07)
Basophils Absolute: 0 10*3/uL (ref 0.0–0.1)
Basophils Relative: 0 %
Eosinophils Absolute: 0 10*3/uL (ref 0.0–0.5)
Eosinophils Relative: 0 %
HCT: 26.7 % — ABNORMAL LOW (ref 39.0–52.0)
Hemoglobin: 9.1 g/dL — ABNORMAL LOW (ref 13.0–17.0)
Immature Granulocytes: 1 %
Lymphocytes Relative: 6 %
Lymphs Abs: 0.6 10*3/uL — ABNORMAL LOW (ref 0.7–4.0)
MCH: 29.4 pg (ref 26.0–34.0)
MCHC: 34.1 g/dL (ref 30.0–36.0)
MCV: 86.1 fL (ref 80.0–100.0)
Monocytes Absolute: 0.8 10*3/uL (ref 0.1–1.0)
Monocytes Relative: 7 %
Neutro Abs: 9.5 10*3/uL — ABNORMAL HIGH (ref 1.7–7.7)
Neutrophils Relative %: 86 %
Platelets: 375 10*3/uL (ref 150–400)
RBC: 3.1 MIL/uL — ABNORMAL LOW (ref 4.22–5.81)
RDW: 11.3 % — ABNORMAL LOW (ref 11.5–15.5)
WBC: 11 10*3/uL — ABNORMAL HIGH (ref 4.0–10.5)
nRBC: 0 % (ref 0.0–0.2)

## 2023-05-26 LAB — COMPREHENSIVE METABOLIC PANEL
ALT: 17 U/L (ref 0–44)
AST: 14 U/L — ABNORMAL LOW (ref 15–41)
Albumin: 2.4 g/dL — ABNORMAL LOW (ref 3.5–5.0)
Alkaline Phosphatase: 59 U/L (ref 38–126)
Anion gap: 10 (ref 5–15)
BUN: 16 mg/dL (ref 6–20)
CO2: 26 mmol/L (ref 22–32)
Calcium: 8.5 mg/dL — ABNORMAL LOW (ref 8.9–10.3)
Chloride: 100 mmol/L (ref 98–111)
Creatinine, Ser: 0.83 mg/dL (ref 0.61–1.24)
GFR, Estimated: >60 mL/min (ref >60)
Glucose, Bld: 118 mg/dL — ABNORMAL HIGH (ref 70–99)
Potassium: 3.7 mmol/L (ref 3.5–5.1)
Sodium: 136 mmol/L (ref 135–145)
Total Bilirubin: 0.4 mg/dL (ref 0.3–1.2)
Total Protein: 6.9 g/dL (ref 6.5–8.1)

## 2023-05-26 LAB — RESP PANEL BY RT-PCR (RSV, FLU A&B, COVID)  RVPGX2
Influenza A by PCR: NEGATIVE
Influenza B by PCR: NEGATIVE
Resp Syncytial Virus by PCR: NEGATIVE
SARS Coronavirus 2 by RT PCR: NEGATIVE

## 2023-05-26 LAB — I-STAT CG4 LACTIC ACID, ED: Lactic Acid, Venous: 0.9 mmol/L (ref 0.5–1.9)

## 2023-05-26 LAB — CBG MONITORING, ED: Glucose-Capillary: 120 mg/dL — ABNORMAL HIGH (ref 70–99)

## 2023-05-26 LAB — PROTIME-INR
INR: 1.2 (ref 0.8–1.2)
Prothrombin Time: 15.5 seconds — ABNORMAL HIGH (ref 11.4–15.2)

## 2023-05-26 LAB — APTT: aPTT: 39 seconds — ABNORMAL HIGH (ref 24–36)

## 2023-05-26 MED ORDER — RISPERIDONE 0.25 MG PO TABS
0.2500 mg | ORAL_TABLET | Freq: Every day | ORAL | Status: DC
  Administered 2023-05-26 – 2023-09-08 (×105): 0.25 mg via ORAL
  Filled 2023-05-26 (×109): qty 1

## 2023-05-26 MED ORDER — ACETAMINOPHEN 325 MG PO TABS
650.0000 mg | ORAL_TABLET | Freq: Four times a day (QID) | ORAL | Status: DC | PRN
  Administered 2023-05-29 – 2023-09-05 (×7): 650 mg via ORAL
  Filled 2023-05-26 (×7): qty 2

## 2023-05-26 MED ORDER — ONDANSETRON HCL 4 MG/2ML IJ SOLN: 4.0000 mg | Freq: Four times a day (QID) | INTRAMUSCULAR | Status: DC | PRN

## 2023-05-26 MED ORDER — INSULIN ASPART 100 UNIT/ML IJ SOLN
0.0000 [IU] | Freq: Three times a day (TID) | INTRAMUSCULAR | Status: DC
  Administered 2023-05-27 – 2023-05-28 (×2): 1 [IU] via SUBCUTANEOUS
  Administered 2023-05-28 – 2023-05-29 (×2): 2 [IU] via SUBCUTANEOUS
  Administered 2023-05-29 – 2023-05-30 (×4): 1 [IU] via SUBCUTANEOUS
  Administered 2023-05-31: 2 [IU] via SUBCUTANEOUS
  Administered 2023-05-31: 3 [IU] via SUBCUTANEOUS
  Administered 2023-06-01 – 2023-06-04 (×6): 1 [IU] via SUBCUTANEOUS
  Administered 2023-06-05: 2 [IU] via SUBCUTANEOUS
  Administered 2023-06-06 – 2023-06-08 (×4): 1 [IU] via SUBCUTANEOUS

## 2023-05-26 MED ORDER — SODIUM CHLORIDE 0.9 % IV SOLN
2.0000 g | INTRAVENOUS | Status: AC
  Administered 2023-05-26 – 2023-06-01 (×7): 2 g via INTRAVENOUS
  Filled 2023-05-26 (×7): qty 20

## 2023-05-26 MED ORDER — SODIUM CHLORIDE 0.9 % IV BOLUS
2000.0000 mL | Freq: Once | INTRAVENOUS | Status: AC
  Administered 2023-05-26: 2000 mL via INTRAVENOUS

## 2023-05-26 MED ORDER — ATORVASTATIN CALCIUM 10 MG PO TABS
10.0000 mg | ORAL_TABLET | Freq: Every day | ORAL | Status: DC
  Administered 2023-05-27 – 2023-09-09 (×106): 10 mg via ORAL
  Filled 2023-05-26 (×106): qty 1

## 2023-05-26 MED ORDER — LISINOPRIL 5 MG PO TABS
2.5000 mg | ORAL_TABLET | Freq: Every day | ORAL | Status: DC
  Administered 2023-05-27 – 2023-07-03 (×36): 2.5 mg via ORAL
  Filled 2023-05-26 (×38): qty 1

## 2023-05-26 MED ORDER — ONDANSETRON HCL 4 MG PO TABS: 4.0000 mg | ORAL_TABLET | Freq: Four times a day (QID) | ORAL | Status: DC | PRN

## 2023-05-26 MED ORDER — FLUOXETINE HCL 20 MG PO CAPS
30.0000 mg | ORAL_CAPSULE | Freq: Every day | ORAL | Status: DC
  Administered 2023-05-27 – 2023-09-09 (×106): 30 mg via ORAL
  Filled 2023-05-26 (×107): qty 1

## 2023-05-26 MED ORDER — OXYCODONE HCL 5 MG PO TABS
5.0000 mg | ORAL_TABLET | ORAL | Status: DC | PRN
  Administered 2023-05-29 – 2023-05-30 (×2): 5 mg via ORAL
  Filled 2023-05-26 (×2): qty 1

## 2023-05-26 MED ORDER — LEVETIRACETAM 500 MG PO TABS
500.0000 mg | ORAL_TABLET | Freq: Two times a day (BID) | ORAL | Status: DC
  Administered 2023-05-26 – 2023-09-09 (×211): 500 mg via ORAL
  Filled 2023-05-26 (×212): qty 1

## 2023-05-26 MED ORDER — ACETAMINOPHEN 650 MG RE SUPP: 650.0000 mg | Freq: Four times a day (QID) | RECTAL | Status: DC | PRN

## 2023-05-26 MED ORDER — SENNOSIDES-DOCUSATE SODIUM 8.6-50 MG PO TABS: 1.0000 | ORAL_TABLET | Freq: Every evening | ORAL | Status: DC | PRN

## 2023-05-26 MED ORDER — INSULIN ASPART 100 UNIT/ML IJ SOLN
0.0000 [IU] | Freq: Every day | INTRAMUSCULAR | Status: DC
  Administered 2023-06-01: 2 [IU] via SUBCUTANEOUS
  Administered 2023-06-08: 1 [IU] via SUBCUTANEOUS
  Administered 2023-06-09: 2 [IU] via SUBCUTANEOUS
  Administered 2023-08-03: 0 [IU] via SUBCUTANEOUS

## 2023-05-26 MED ORDER — ENOXAPARIN SODIUM 40 MG/0.4ML IJ SOSY
40.0000 mg | PREFILLED_SYRINGE | Freq: Every day | INTRAMUSCULAR | Status: DC
  Administered 2023-05-27 – 2023-09-09 (×105): 40 mg via SUBCUTANEOUS
  Filled 2023-05-26 (×104): qty 0.4

## 2023-05-26 NOTE — ED Notes (Signed)
Patient swallowed pills without problems, answered questions in [place] and [place].

## 2023-05-26 NOTE — ED Triage Notes (Signed)
Patient BIB [facility] from residence where roommate called for that patient not being able to get up and walk for the past two days and unable to eat x2 days. Patient has hx of TBI and lives with a friend who knew him prior to injury and family intermittently help take care of him. Friend needs help with resources to take care of him b/c he refuses meds and needs help with ADLs. VSS

## 2023-05-26 NOTE — H&P (Signed)
History and Physical    [name] UJW:[id number] DOB: October 10, 1966 DOA: 05/26/2023  PCP: Marcine Matar, MD   Patient coming from: Home   Chief Complaint: Unable to ambulate, not eating   HPI: [name] is a 57 y.o. 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  Patient has been staying with a friend who works 2 jobs and is gone most of the time.  He has noticed that the patient has not been ambulating as usual for the past 2 days and has not been eating.  Patient's friend also notes that he has had difficulty adequately caring for the patient and is hoping to find some help.  Patient himself has no acute complaints.    ED Course: Upon arrival to the ED, patient is found to be afebrile and saturating well on room air with normal heart rate and stable blood pressure.  Chest x-rays and plain radiographs of the left foot are negative for acute findings.  Labs are notable for WBC 11,000, lactic acid 0.9, hemoglobin 9.1, and albumin 2.4.   Blood cultures were collected in the ED and the patient was given 2 L of NS and 2 g IV Rocephin.  Review of Systems:  ROS limited by patient's clinical condition.  Past Medical History:  Diagnosis Date   Diabetes mellitus without complication (HCC)    Seizures (HCC)    TBI (traumatic brain injury) (HCC) 03/2017    Past Surgical History:  Procedure Laterality Date   BIOPSY  08/19/2021   Procedure: BIOPSY;  Surgeon: Benancio Deeds, MD;  Location: [facility];  Service: Gastroenterology;;   CRANIOTOMY Right 01/13/2017   Procedure: 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, Placement of bone flap in abdomen;  Surgeon: Coletta Memos, MD;  Location: [facility] OR;   Service: Neurosurgery;  Laterality: Right;   CRANIOTOMY Right 03/19/2017   Procedure: Cranioplasty with placement of bone flap from abdomen;  Surgeon: Tia Alert, MD;  Location: [facility] OR;  Service: Neurosurgery;  Laterality: Right;   ESOPHAGOGASTRODUODENOSCOPY (EGD) WITH PROPOFOL N/A 08/19/2021   Procedure: ESOPHAGOGASTRODUODENOSCOPY (EGD) WITH PROPOFOL;  Surgeon: Benancio Deeds, MD;  Location: [facility];  Service: Gastroenterology;  Laterality: N/A;   POLYPECTOMY  08/19/2021   Procedure: POLYPECTOMY;  Surgeon: Benancio Deeds, MD;  Location: [facility];  Service: Gastroenterology;;   Gaspar Bidding DILATION N/A 08/19/2021   Procedure: Gaspar Bidding DILATION;  Surgeon: Benancio Deeds, MD;  Location: [facility];  Service: Gastroenterology;  Laterality: N/A;    Social History:   reports that he has never smoked. He has never used smokeless tobacco. He reports that he does not currently use alcohol. He reports that he does not use drugs.  Allergies  Allergen Reactions   Latex Itching and Rash    Family History  Problem Relation Age of Onset   Diabetes Mother    Diabetes Father      Prior to Admission medications   Medication Sig Start Date End Date Taking? Authorizing Provider  atorvastatin (LIPITOR) 10 MG tablet Take 1 tablet (10 mg total) by mouth daily. 03/11/23   Marcine Matar, MD  ferrous sulfate 324 MG TBEC Take 324 mg by mouth.    [provider]  FLUoxetine (PROZAC) 10 MG capsule Take 3 capsules (30 mg  total) by mouth daily. 03/11/23   Marcine Matar, MD  hydrOXYzine (ATARAX) 10 MG tablet Take 1 tablet (10 mg total) by mouth 3 (three) times daily as needed. 12/29/21   Shanna Cisco, NP  levETIRAcetam (KEPPRA) 500 MG tablet Take 1.5 tablets (750mg ) by mouth twice daily. 03/11/23   Marcine Matar, MD  lisinopril (ZESTRIL) 2.5 MG tablet Take 1 tablet (2.5 mg total) by mouth daily. 03/11/23   Marcine Matar, MD  metFORMIN (GLUCOPHAGE) 1000 MG tablet  Take 1 tablet (1,000 mg total) by mouth 2 (two) times daily with a meal. 03/11/23   Marcine Matar, MD  risperiDONE (RISPERDAL) 0.25 MG tablet Take 1 tablet (0.25 mg total) by mouth at bedtime. 02/18/23   Van Clines, MD  vitamin B-12 (CYANOCOBALAMIN) 100 MCG tablet Take 100 mcg by mouth daily.    [provider]    Physical Exam: Vitals:   05/26/23 2009 05/26/23 2010 05/26/23 2018 05/26/23 2130  BP:   138/82 (!) 148/79  Pulse:   83 78  Resp:   17 20  Temp: 98.3 F (36.8 C)  98.3 F (36.8 C)   TempSrc:   Oral   SpO2:  97% 100% 100%    Constitutional: NAD, no pallor or diaphoresis  Eyes: PERTLA, lids and conjunctivae normal ENMT: Mucous membranes are moist. Posterior pharynx clear of any exudate or lesions.   Neck: supple, no masses  Respiratory: no wheezing, no crackles. No accessory muscle use.  Cardiovascular: S1 & S2 heard, regular rate and rhythm. No JVD. Abdomen: No distension, no tenderness, soft. Bowel sounds active.  Musculoskeletal: no clubbing / cyanosis. No joint deformity upper and lower extremities.   Skin: Erythema and edema involving left forefoot with discolored, draining, and malodorous 2nd digit. Otherwise warm, dry, well-perfused. Neurologic: PERRL, EOMI. Moving all extremities. Alert and oriented to person and place.  Psychiatric: Calm. Cooperative.    Labs and Imaging on Admission: I have personally reviewed following labs and imaging studies  CBC: Recent Labs  Lab 05/26/23 2023  WBC 11.0*  NEUTROABS 9.5*  HGB 9.1*  HCT 26.7*  MCV 86.1  PLT 375   Basic Metabolic Panel: Recent Labs  Lab 05/26/23 2023  NA 136  K 3.7  CL 100  CO2 26  GLUCOSE 118*  BUN 16  CREATININE 0.83  CALCIUM 8.5*   GFR: CrCl cannot be calculated (Unknown ideal weight.). Liver Function Tests: Recent Labs  Lab 05/26/23 2023  AST 14*  ALT 17  ALKPHOS 59  BILITOT 0.4  PROT 6.9  ALBUMIN 2.4*   No results for input(s): "LIPASE", "AMYLASE" in the last  168 hours. No results for input(s): "AMMONIA" in the last 168 hours. Coagulation Profile: Recent Labs  Lab 05/26/23 2023  INR 1.2   Cardiac Enzymes: No results for input(s): "CKTOTAL", "CKMB", "CKMBINDEX", "TROPONINI" in the last 168 hours. BNP (last 3 results) No results for input(s): "PROBNP" in the last 8760 hours. HbA1C: No results for input(s): "HGBA1C" in the last 72 hours. CBG: No results for input(s): "GLUCAP" in the last 168 hours. Lipid Profile: No results for input(s): "CHOL", "HDL", "LDLCALC", "TRIG", "CHOLHDL", "LDLDIRECT" in the last 72 hours. Thyroid Function Tests: No results for input(s): "TSH", "T4TOTAL", "FREET4", "T3FREE", "THYROIDAB" in the last 72 hours. Anemia Panel: No results for input(s): "VITAMINB12", "FOLATE", "FERRITIN", "TIBC", "IRON", "RETICCTPCT" in the last 72 hours. Urine analysis:    Component Value Date/Time   COLORURINE YELLOW 09/29/2022 1900   APPEARANCEUR CLEAR 09/29/2022  1900   APPEARANCEUR Clear 01/23/2021 1634   LABSPEC 1.020 09/29/2022 1900   PHURINE 5.0 09/29/2022 1900   GLUCOSEU NEGATIVE 09/29/2022 1900   GLUCOSEU NEGATIVE 10/01/2020 0853   HGBUR NEGATIVE 09/29/2022 1900   BILIRUBINUR NEGATIVE 09/29/2022 1900   BILIRUBINUR Negative 01/23/2021 1634   KETONESUR NEGATIVE 09/29/2022 1900   PROTEINUR 100 (A) 09/29/2022 1900   UROBILINOGEN 0.2 10/01/2020 0853   NITRITE NEGATIVE 09/29/2022 1900   LEUKOCYTESUR NEGATIVE 09/29/2022 1900   Sepsis Labs: @LABRCNTIP (procalcitonin:4,lacticidven:4) ) Recent Results (from the past 240 hour(s))  Resp panel by RT-PCR (RSV, Flu A&B, Covid) Anterior Nasal Swab     Status: None   Collection Time: 05/26/23  8:26 PM   Specimen: Anterior Nasal Swab  Result Value Ref Range Status   SARS Coronavirus 2 by RT PCR NEGATIVE NEGATIVE Final   Influenza A by PCR NEGATIVE NEGATIVE Final   Influenza B by PCR NEGATIVE NEGATIVE Final    Comment: (NOTE) The Xpert Xpress SARS-CoV-2/FLU/RSV plus assay is  intended as an aid in the diagnosis of influenza from Nasopharyngeal swab specimens and should not be used as a sole basis for treatment. Nasal washings and aspirates are unacceptable for Xpert Xpress SARS-CoV-2/FLU/RSV testing.  Fact Sheet for Patients: BloggerCourse.com  Fact Sheet for Healthcare Providers: SeriousBroker.it  This test is not yet approved or cleared by the [place] FDA and has been authorized for detection and/or diagnosis of SARS-CoV-2 by FDA under an Emergency Use Authorization (EUA). This EUA will remain in effect (meaning this test can be used) for the duration of the COVID-19 declaration under Section 564(b)(1) of the Act, 21 U.S.C. section 360bbb-3(b)(1), unless the authorization is terminated or revoked.     Resp Syncytial Virus by PCR NEGATIVE NEGATIVE Final    Comment: (NOTE) Fact Sheet for Patients: BloggerCourse.com  Fact Sheet for Healthcare Providers: SeriousBroker.it  This test is not yet approved or cleared by the [place] FDA and has been authorized for detection and/or diagnosis of SARS-CoV-2 by FDA under an Emergency Use Authorization (EUA). This EUA will remain in effect (meaning this test can be used) for the duration of the COVID-19 declaration under Section 564(b)(1) of the Act, 21 U.S.C. section 360bbb-3(b)(1), unless the authorization is terminated or revoked.  Performed at [facility], [address], [place], [place] [address]      Radiological Exams on Admission: DG Foot Complete Left  Result Date: 05/26/2023 CLINICAL DATA:  Pain EXAM: LEFT FOOT - COMPLETE 3+ VIEW COMPARISON:  None available FINDINGS: Diffuse vascular calcifications. Plantar calcaneal spur. No acute bony abnormality. Specifically, no fracture, subluxation, or dislocation. IMPRESSION: No acute bony abnormality. Electronically Signed   By: Charlett Nose M.D.   On: 05/26/2023 20:48   DG Chest Port 1 View  Result Date: 05/26/2023 CLINICAL DATA:  Sepsis EXAM: PORTABLE CHEST 1 VIEW COMPARISON:  Chest x-ray 05/10/2018 FINDINGS: The heart size and mediastinal contours are within normal limits. Both lungs are clear. The visualized skeletal structures are unremarkable. IMPRESSION: No active disease. Electronically Signed   By: Darliss Cheney M.D.   On: 05/26/2023 20:44    EKG: Independently reviewed. Sinus rhythm.   Assessment/Plan   1. Left foot cellulitis  - Not septic on admission   - No acute osseous abnormality on plain radiographs but suspicion for osteomyelitis remains  - Continue Rocephin, check MRI, check ABI, follow cultures and clinical course   2. Hx of TBI; depression; aggression  - Continue Prozac and Risperdal, use delirium  precautions   3. Type II DM  - A1c was only 5.2% in May 2024  - Check CBGs and use low-intensity SSI if needed   4. Seizure disorder  - Continue Keppra    DVT prophylaxis: Lovenox  Code Status: Full  Level of Care: Level of care: Med-Surg Family Communication: Friend updated from ED   Disposition Plan:  Patient is from: home  Anticipated d/c is to: TBD Anticipated d/c date is: 05/29/23  Patient currently: Pending improved cellulitis, PT eval  Consults called: None  Admission status: Inpatient     Briscoe Deutscher, MD [facility]  05/26/2023, 10:05 PM

## 2023-05-26 NOTE — Progress Notes (Signed)
Pt arrived from ED, no acute distress noted, denies pain A&Ox4, calm, and cooperative. Affected extremity cleansed non stick pad/ Kerlix placed , purulent drainage noted w/foul odor.  All orders initiated, pt did not have any questions.

## 2023-05-26 NOTE — Sepsis Progress Note (Signed)
Elink monitoring for the code sepsis protocol.  

## 2023-05-26 NOTE — ED Notes (Signed)
ED TO INPATIENT HANDOFF REPORT  ED Nurse Name and Phone #: Vannak Montenegro, RN [phone]  S Name/Age/Gender [name] 57 y.o. male Room/Bed: 010C/010C  Code Status   Code Status: Full Code  Home/SNF/Other Home Patient oriented to: self and situation Is this baseline? Yes   Triage Complete: Triage complete  Chief Complaint Cellulitis of left foot [L03.116]  Triage Note Patient BIB [facility] from residence where roommate called for that patient not being able to get up and walk for the past two days and unable to eat x2 days. Patient has hx of TBI and lives with a friend who knew him prior to injury and family intermittently help take care of him. Friend needs help with resources to take care of him b/c he refuses meds and needs help with ADLs. VSS   Allergies Allergies  Allergen Reactions   Latex Itching and Rash    Level of Care/Admitting Diagnosis ED Disposition     ED Disposition  Admit   Condition  --   Comment  Hospital Area: [facility] [[id number]]  Level of Care: Med-Surg [16]  May admit patient to [facility] or [facility] if equivalent level of care is available:: Yes  Covid Evaluation: Confirmed COVID Negative  Diagnosis: Cellulitis of left foot [213086]  Admitting Physician: Briscoe Deutscher [[id number]]  Attending Physician: Briscoe Deutscher [[id number]]  Certification:: I certify this patient will need inpatient services for at least 2 midnights  Estimated Length of Stay: 3          B Medical/Surgery History Past Medical History:  Diagnosis Date   Diabetes mellitus without complication (HCC)    Seizures (HCC)    TBI (traumatic brain injury) (HCC) 03/2017   Past Surgical History:  Procedure Laterality Date   BIOPSY  08/19/2021   Procedure: BIOPSY;  Surgeon: Benancio Deeds, MD;  Location: [facility];  Service: Gastroenterology;;   CRANIOTOMY Right 01/13/2017   Procedure: RIGHT FRONTO TEMPORAL PARIETAL CRANIECTOMY FOR HEMATOMA  EVACUATION, Placement of bone flap in abdomen;  Surgeon: Coletta Memos, MD;  Location: [facility] OR;  Service: Neurosurgery;  Laterality: Right;   CRANIOTOMY Right 03/19/2017   Procedure: Cranioplasty with placement of bone flap from abdomen;  Surgeon: Tia Alert, MD;  Location: [facility] OR;  Service: Neurosurgery;  Laterality: Right;   ESOPHAGOGASTRODUODENOSCOPY (EGD) WITH PROPOFOL N/A 08/19/2021   Procedure: ESOPHAGOGASTRODUODENOSCOPY (EGD) WITH PROPOFOL;  Surgeon: Benancio Deeds, MD;  Location: [facility];  Service: Gastroenterology;  Laterality: N/A;   POLYPECTOMY  08/19/2021   Procedure: POLYPECTOMY;  Surgeon: Benancio Deeds, MD;  Location: [facility];  Service: Gastroenterology;;   Gaspar Bidding DILATION N/A 08/19/2021   Procedure: Gaspar Bidding DILATION;  Surgeon: Benancio Deeds, MD;  Location: [facility];  Service: Gastroenterology;  Laterality: N/A;     A IV Location/Drains/Wounds Patient Lines/Drains/Airways Status     Active Line/Drains/Airways     Name Placement date Placement time Site Days   Peripheral IV 05/26/23 20 G Anterior;Right Forearm 05/26/23  2022  Forearm  less than 1   Peripheral IV 05/26/23 20 G Right Antecubital 05/26/23  2032  Antecubital  less than 1            Intake/Output Last 24 hours No intake or output data in the 24 hours ending 05/26/23 2208  Labs/Imaging Results for orders placed or performed during the hospital encounter of 05/26/23 (from the past 48 hour(s))  Comprehensive metabolic panel     Status: Abnormal   Collection Time:  05/26/23  8:23 PM  Result Value Ref Range   Sodium 136 135 - 145 mmol/L   Potassium 3.7 3.5 - 5.1 mmol/L   Chloride 100 98 - 111 mmol/L   CO2 26 22 - 32 mmol/L   Glucose, Bld 118 (H) 70 - 99 mg/dL    Comment: Glucose reference range applies only to samples taken after fasting for at least 8 hours.   BUN 16 6 - 20 mg/dL   Creatinine, Ser 2.44 0.61 - 1.24 mg/dL   Calcium 8.5 (L) 8.9 - 10.3 mg/dL   Total Protein  6.9 6.5 - 8.1 g/dL   Albumin 2.4 (L) 3.5 - 5.0 g/dL   AST 14 (L) 15 - 41 U/L   ALT 17 0 - 44 U/L   Alkaline Phosphatase 59 38 - 126 U/L   Total Bilirubin 0.4 0.3 - 1.2 mg/dL   GFR, Estimated >01 >02 mL/min    Comment: (NOTE) Calculated using the CKD-EPI Creatinine Equation (2021)    Anion gap 10 5 - 15    Comment: Performed at [facility], [address] [place], [place] [address]  CBC with Differential     Status: Abnormal   Collection Time: 05/26/23  8:23 PM  Result Value Ref Range   WBC 11.0 (H) 4.0 - 10.5 K/uL   RBC 3.10 (L) 4.22 - 5.81 MIL/uL   Hemoglobin 9.1 (L) 13.0 - 17.0 g/dL   HCT 64.4 (L) 03.4 - 74.2 %   MCV 86.1 80.0 - 100.0 fL   MCH 29.4 26.0 - 34.0 pg   MCHC 34.1 30.0 - 36.0 g/dL   RDW 59.5 (L) 63.8 - 75.6 %   Platelets 375 150 - 400 K/uL   nRBC 0.0 0.0 - 0.2 %   Neutrophils Relative % 86 %   Neutro Abs 9.5 (H) 1.7 - 7.7 K/uL   Lymphocytes Relative 6 %   Lymphs Abs 0.6 (L) 0.7 - 4.0 K/uL   Monocytes Relative 7 %   Monocytes Absolute 0.8 0.1 - 1.0 K/uL   Eosinophils Relative 0 %   Eosinophils Absolute 0.0 0.0 - 0.5 K/uL   Basophils Relative 0 %   Basophils Absolute 0.0 0.0 - 0.1 K/uL   Immature Granulocytes 1 %   Abs Immature Granulocytes 0.06 0.00 - 0.07 K/uL    Comment: Performed at [facility], [address] [place], [place] [address]  Protime-INR     Status: Abnormal   Collection Time: 05/26/23  8:23 PM  Result Value Ref Range   Prothrombin Time 15.5 (H) 11.4 - 15.2 seconds   INR 1.2 0.8 - 1.2    Comment: (NOTE) INR goal varies based on device and disease states. Performed at [facility], [address] [place], [place] [address]   APTT     Status: Abnormal   Collection Time: 05/26/23  8:23 PM  Result Value Ref Range   aPTT 39 (H) 24 - 36 seconds    Comment:        IF BASELINE aPTT IS ELEVATED, SUGGEST PATIENT RISK ASSESSMENT BE USED TO DETERMINE APPROPRIATE ANTICOAGULANT THERAPY. Performed at [facility],  [address] [place], [place] [address]   Resp panel by RT-PCR (RSV, Flu A&B, Covid) Anterior Nasal Swab     Status: None   Collection Time: 05/26/23  8:26 PM   Specimen: Anterior Nasal Swab  Result Value Ref Range   SARS Coronavirus 2 by RT PCR NEGATIVE NEGATIVE  Influenza A by PCR NEGATIVE NEGATIVE   Influenza B by PCR NEGATIVE NEGATIVE    Comment: (NOTE) The Xpert Xpress SARS-CoV-2/FLU/RSV plus assay is intended as an aid in the diagnosis of influenza from Nasopharyngeal swab specimens and should not be used as a sole basis for treatment. Nasal washings and aspirates are unacceptable for Xpert Xpress SARS-CoV-2/FLU/RSV testing.  Fact Sheet for Patients: BloggerCourse.com  Fact Sheet for Healthcare Providers: SeriousBroker.it  This test is not yet approved or cleared by the [place] FDA and has been authorized for detection and/or diagnosis of SARS-CoV-2 by FDA under an Emergency Use Authorization (EUA). This EUA will remain in effect (meaning this test can be used) for the duration of the COVID-19 declaration under Section 564(b)(1) of the Act, 21 U.S.C. section 360bbb-3(b)(1), unless the authorization is terminated or revoked.     Resp Syncytial Virus by PCR NEGATIVE NEGATIVE    Comment: (NOTE) Fact Sheet for Patients: BloggerCourse.com  Fact Sheet for Healthcare Providers: SeriousBroker.it  This test is not yet approved or cleared by the [place] FDA and has been authorized for detection and/or diagnosis of SARS-CoV-2 by FDA under an Emergency Use Authorization (EUA). This EUA will remain in effect (meaning this test can be used) for the duration of the COVID-19 declaration under Section 564(b)(1) of the Act, 21 U.S.C. section 360bbb-3(b)(1), unless the authorization is terminated or revoked.  Performed at [facility], [address], [place],  [place] [address]   I-Stat Lactic Acid, ED     Status: None   Collection Time: 05/26/23  8:45 PM  Result Value Ref Range   Lactic Acid, Venous 0.9 0.5 - 1.9 mmol/L   DG Foot Complete Left  Result Date: 05/26/2023 CLINICAL DATA:  Pain EXAM: LEFT FOOT - COMPLETE 3+ VIEW COMPARISON:  None available FINDINGS: Diffuse vascular calcifications. Plantar calcaneal spur. No acute bony abnormality. Specifically, no fracture, subluxation, or dislocation. IMPRESSION: No acute bony abnormality. Electronically Signed   By: Charlett Nose M.D.   On: 05/26/2023 20:48   DG Chest Port 1 View  Result Date: 05/26/2023 CLINICAL DATA:  Sepsis EXAM: PORTABLE CHEST 1 VIEW COMPARISON:  Chest x-ray 05/10/2018 FINDINGS: The heart size and mediastinal contours are within normal limits. Both lungs are clear. The visualized skeletal structures are unremarkable. IMPRESSION: No active disease. Electronically Signed   By: Darliss Cheney M.D.   On: 05/26/2023 20:44    Pending Labs Unresulted Labs (From admission, onward)     Start     Ordered   06/02/23 0500  Creatinine, serum  (enoxaparin (LOVENOX)    CrCl >/= 30 ml/min)  Weekly,   R     Comments: while on enoxaparin therapy    05/26/23 2205   05/27/23 0500  HIV Antibody (routine testing w rflx)  (HIV Antibody (Routine testing w reflex) panel)  Tomorrow morning,   R        05/26/23 2205   05/27/23 0500  Basic metabolic panel  Daily,   R      05/26/23 2205   05/27/23 0500  CBC  Daily,   R      05/26/23 2205   05/26/23 2026  Blood Culture (routine x 2)  (Septic presentation on arrival (screening labs, nursing and treatment orders for obvious sepsis))  BLOOD CULTURE X 2,   STAT      05/26/23 2026   05/26/23 2026  Urinalysis, w/ Reflex to Culture (Infection Suspected) -Urine, Clean Catch  (Septic presentation on arrival (  screening labs, nursing and treatment orders for obvious sepsis))  ONCE - URGENT,   URGENT       Question:  Specimen Source  Answer:  Urine, Clean Catch   05/26/23  2026            Vitals/Pain Today's Vitals   05/26/23 2010 05/26/23 2018 05/26/23 2030 05/26/23 2130  BP:  138/82  (!) 148/79  Pulse:  83  78  Resp:  17  20  Temp:  98.3 F (36.8 C)    TempSrc:  Oral    SpO2: 97% 100%  100%  PainSc:   0-No pain     Isolation Precautions No active isolations  Medications Medications  cefTRIAXone (ROCEPHIN) 2 g in sodium chloride 0.9 % 100 mL IVPB (0 g Intravenous Stopped 05/26/23 2138)  atorvastatin (LIPITOR) tablet 10 mg (has no administration in time range)  lisinopril (ZESTRIL) tablet 2.5 mg (has no administration in time range)  FLUoxetine (PROZAC) capsule 30 mg (has no administration in time range)  risperiDONE (RISPERDAL) tablet 0.25 mg (has no administration in time range)  levETIRAcetam (KEPPRA) tablet 500 mg (has no administration in time range)  enoxaparin (LOVENOX) injection 40 mg (has no administration in time range)  acetaminophen (TYLENOL) tablet 650 mg (has no administration in time range)    Or  acetaminophen (TYLENOL) suppository 650 mg (has no administration in time range)  oxyCODONE (Oxy IR/ROXICODONE) immediate release tablet 5 mg (has no administration in time range)  senna-docusate (Senokot-S) tablet 1 tablet (has no administration in time range)  ondansetron (ZOFRAN) tablet 4 mg (has no administration in time range)    Or  ondansetron (ZOFRAN) injection 4 mg (has no administration in time range)  sodium chloride 0.9 % bolus 2,000 mL (0 mLs Intravenous Stopped 05/26/23 2208)    Mobility walks with device     Focused Assessments    R Recommendations: See Admitting Provider Note  Report given to:   Additional Notes: Patient has hx of TBI and speaks spanish, he is oriented to self and maybe place but not really situation. TBI leaves him in need of significant cues to complete ADLs and basic hygiene. Patient has necrotic toe and will likely need surgery for same. If you has if he has pain or complaints he just  says he's fine over and over. Vital signs are stable, no complaints.

## 2023-05-27 ENCOUNTER — Inpatient Hospital Stay (HOSPITAL_COMMUNITY): Payer: Self-pay

## 2023-05-27 DIAGNOSIS — L02612 Cutaneous abscess of left foot: Secondary | ICD-10-CM

## 2023-05-27 DIAGNOSIS — E43 Unspecified severe protein-calorie malnutrition: Secondary | ICD-10-CM

## 2023-05-27 DIAGNOSIS — I739 Peripheral vascular disease, unspecified: Secondary | ICD-10-CM | POA: Diagnosis present

## 2023-05-27 DIAGNOSIS — I70262 Atherosclerosis of native arteries of extremities with gangrene, left leg: Secondary | ICD-10-CM

## 2023-05-27 LAB — URINALYSIS, W/ REFLEX TO CULTURE (INFECTION SUSPECTED)
Bilirubin Urine: NEGATIVE
Hgb urine dipstick: NEGATIVE
Ketones, ur: 5 mg/dL — AB
Leukocytes,Ua: NEGATIVE
Protein, ur: 100 mg/dL — AB
pH: 5 (ref 5.0–8.0)

## 2023-05-27 LAB — BASIC METABOLIC PANEL
Anion gap: 12 (ref 5–15)
BUN: 15 mg/dL (ref 6–20)
CO2: 24 mmol/L (ref 22–32)
Calcium: 8.2 mg/dL — ABNORMAL LOW (ref 8.9–10.3)
Chloride: 100 mmol/L (ref 98–111)
Creatinine, Ser: 0.81 mg/dL (ref 0.61–1.24)
Glucose, Bld: 103 mg/dL — ABNORMAL HIGH (ref 70–99)
Potassium: 3.5 mmol/L (ref 3.5–5.1)
Sodium: 136 mmol/L (ref 135–145)

## 2023-05-27 LAB — CULTURE, BLOOD (ROUTINE X 2)
Culture: NO GROWTH
Special Requests: ADEQUATE
Special Requests: ADEQUATE

## 2023-05-27 LAB — GLUCOSE, CAPILLARY
Glucose-Capillary: 85 mg/dL (ref 70–99)
Glucose-Capillary: 81 mg/dL (ref 70–99)
Glucose-Capillary: 192 mg/dL — ABNORMAL HIGH (ref 70–99)
Glucose-Capillary: 157 mg/dL — ABNORMAL HIGH (ref 70–99)

## 2023-05-27 LAB — CBC
HCT: 26 % — ABNORMAL LOW (ref 39.0–52.0)
Hemoglobin: 8.9 g/dL — ABNORMAL LOW (ref 13.0–17.0)
MCH: 29.3 pg (ref 26.0–34.0)
MCHC: 34.2 g/dL (ref 30.0–36.0)
MCV: 85.5 fL (ref 80.0–100.0)
RBC: 3.04 MIL/uL — ABNORMAL LOW (ref 4.22–5.81)
RDW: 11.4 % — ABNORMAL LOW (ref 11.5–15.5)
nRBC: 0 % (ref 0.0–0.2)

## 2023-05-27 LAB — HIV ANTIBODY (ROUTINE TESTING W REFLEX): HIV Screen 4th Generation wRfx: NONREACTIVE

## 2023-05-27 LAB — HEMOGLOBIN A1C
Hgb A1c MFr Bld: 5.8 % — ABNORMAL HIGH (ref 4.8–5.6)
Mean Plasma Glucose: 119.76 mg/dL

## 2023-05-27 MED ORDER — FERROUS SULFATE 325 (65 FE) MG PO TABS
324.0000 mg | ORAL_TABLET | Freq: Every day | ORAL | Status: DC
  Administered 2023-05-28 – 2023-06-04 (×8): 324 mg via ORAL
  Filled 2023-05-27 (×8): qty 1

## 2023-05-27 NOTE — Progress Notes (Signed)
Urine to be collected.

## 2023-05-27 NOTE — Progress Notes (Signed)
VASCULAR LAB    ABI has been performed.  See CV proc for preliminary results.   Becket Wecker, RVT 05/27/2023, 10:50 AM

## 2023-05-27 NOTE — H&P (View-Only) (Signed)
ORTHOPAEDIC CONSULTATION  REQUESTING PHYSICIAN: Jamie Closs, MD  Chief Complaint: Necrotic ulceration toes left foot.  HPI: [name] is a 57 y.o. 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, Placement of bone flap in abdomen;  Surgeon: Coletta Memos, MD;  Location: [facility] OR;  Service: Neurosurgery;  Laterality: Right;   CRANIOTOMY Right 03/19/2017   Procedure: Cranioplasty with placement of bone flap from abdomen;  Surgeon: Tia Alert, MD;  Location: [facility] OR;  Service: Neurosurgery;  Laterality: Right;   ESOPHAGOGASTRODUODENOSCOPY (EGD) WITH PROPOFOL N/A 08/19/2021   Procedure: ESOPHAGOGASTRODUODENOSCOPY (EGD) WITH PROPOFOL;  Surgeon: Benancio Deeds, MD;  Location: [facility];  Service: Gastroenterology;  Laterality: N/A;   POLYPECTOMY  08/19/2021   Procedure: POLYPECTOMY;  Surgeon: Benancio Deeds, MD;  Location: [facility];  Service: Gastroenterology;;   Gaspar Bidding DILATION N/A 08/19/2021   Procedure: Gaspar Bidding DILATION;  Surgeon: Benancio Deeds, MD;  Location: [facility];  Service: Gastroenterology;  Laterality: N/A;   Social History   Socioeconomic History   Marital status: Single    Spouse name: Not on file   Number of children: 1   Years of education: 12   Highest education level: High school graduate  Occupational History   Occupation: not working  Tobacco Use   Smoking status: Never   Smokeless tobacco: Never  Vaping  Use   Vaping status: Never Used  Substance and Sexual Activity   Alcohol use: Not Currently    Comment: socially, not since accident   Drug use: No   Sexual activity: Not on file  Other Topics Concern   Not on file  Social History Narrative   Lives with sister and her family.  Has one daughter in [place].  Does not work.  Education: high school.    Right handed    Social Determinants of Health   Financial Resource Strain: Not on file  Food Insecurity: Not on file  Transportation Needs: Not on file  Physical Activity: Not on file  Stress: Not on file  Social Connections: Not on file   Family History  Problem Relation Age of Onset   Diabetes Mother    Diabetes Father    - negative except otherwise stated in the family history section Allergies  Allergen Reactions   Latex Itching and Rash   Prior to Admission medications   Medication Sig Start Date End Date Taking? Authorizing Provider  atorvastatin (LIPITOR) 10 MG tablet Take 1 tablet (10 mg total) by mouth daily. 03/11/23  Yes Marcine Matar, MD  ferrous sulfate 324 MG TBEC Take 324 mg by mouth daily with breakfast.   Yes [provider]  FLUoxetine (PROZAC) 10 MG capsule Take 3 capsules (30 mg total) by mouth daily. 03/11/23  Yes Marcine Matar, MD  hydrOXYzine (ATARAX) 10 MG tablet Take 1 tablet (10 mg total) by mouth 3 (three) times daily as needed. Patient taking differently: Take 10 mg by mouth 3 (three) times daily as needed for anxiety or  itching. 12/29/21  Yes Toy Cookey E, NP  levETIRAcetam (KEPPRA) 500 MG tablet Take 1.5 tablets (750mg ) by mouth twice daily. Patient taking differently: Take 750 mg by mouth 2 (two) times daily. 03/11/23  Yes Marcine Matar, MD  lisinopril (ZESTRIL) 2.5 MG tablet Take 1 tablet (2.5 mg total) by mouth daily. 03/11/23  Yes Marcine Matar, MD  metFORMIN (GLUCOPHAGE) 1000 MG tablet Take 1 tablet (1,000 mg total) by mouth 2 (two) times daily with a meal. 03/11/23  Yes  Marcine Matar, MD  risperiDONE (RISPERDAL) 0.25 MG tablet Take 1 tablet (0.25 mg total) by mouth at bedtime. 02/18/23  Yes Van Clines, MD  vitamin B-12 (CYANOCOBALAMIN) 100 MCG tablet Take 100 mcg by mouth daily.   Yes [provider]   VAS [place] ABI WITH/WO TBI  Result Date: 05/27/2023  LOWER EXTREMITY DOPPLER STUDY Patient Name:  [name]  Date of Exam:   05/27/2023 Medical Rec #: [id number]               Accession #:    [id number] Date of Birth: August 07, 1966              Patient Gender: M Patient Age:   79 years Exam Location:  [facility] Procedure:      VAS [place] ABI WITH/WO TBI Referring Phys: TIMOTHY OPYD --------------------------------------------------------------------------------  Indications: Ulceration, and gangrene. High Risk Factors: Diabetes. Other Factors: Traumatic brain injury.  Limitations: Today's exam was limited due to bandages and an open wound. Comparison Study: No prior study on file Performing Technologist: Sherren Kerns RVS  Examination Guidelines: A complete evaluation includes at minimum, Doppler waveform signals and systolic blood pressure reading at the level of bilateral brachial, anterior tibial, and posterior tibial arteries, when vessel segments are accessible. Bilateral testing is considered an integral part of a complete examination. Photoelectric Plethysmograph (PPG) waveforms and toe systolic pressure readings are included as required and additional duplex testing as needed. Limited examinations for reoccurring indications may be performed as noted.  ABI Findings: +---------+------------------+-----+-----------+--------+ Right    Rt Pressure (mmHg)IndexWaveform   Comment  +---------+------------------+-----+-----------+--------+ Brachial 147                    triphasic           +---------+------------------+-----+-----------+--------+ PTA      254               1.65 multiphasic          +---------+------------------+-----+-----------+--------+ DP       254               1.65 multiphasic         +---------+------------------+-----+-----------+--------+ Great Toe98                0.64                     +---------+------------------+-----+-----------+--------+ +--------+------------------+-----+-----------+-------+ Left    Lt Pressure (mmHg)IndexWaveform   Comment +--------+------------------+-----+-----------+-------+ [id number]                    triphasic          +--------+------------------+-----+-----------+-------+ PTA     254               1.65 multiphasic        +--------+------------------+-----+-----------+-------+ DP      254               1.65 multiphasic        +--------+------------------+-----+-----------+-------+  Summary: Right: Resting right ankle-brachial index indicates noncompressible right lower extremity arteries. The right toe-brachial index is abnormal. Left: Resting left ankle-brachial index indicates noncompressible left lower extremity arteries. Unable to attain TBI secondary to ulceration/gangrene and bandages. *See table(s) above for measurements and observations.  Electronically signed by Gerarda Fraction on 05/27/2023 at 6:09:57 PM.    Final    DG Foot Complete Left  Result Date: 05/26/2023 CLINICAL DATA:  Pain EXAM: LEFT FOOT - COMPLETE 3+ VIEW COMPARISON:  None available FINDINGS: Diffuse vascular calcifications. Plantar calcaneal spur. No acute bony abnormality. Specifically, no fracture, subluxation, or dislocation. IMPRESSION: No acute bony abnormality. Electronically Signed   By: Charlett Nose M.D.   On: 05/26/2023 20:48   DG Chest Port 1 View  Result Date: 05/26/2023 CLINICAL DATA:  Sepsis EXAM: PORTABLE CHEST 1 VIEW COMPARISON:  Chest x-ray 05/10/2018 FINDINGS: The heart size and mediastinal contours are within normal limits. Both lungs are clear. The visualized skeletal structures are unremarkable. IMPRESSION: No  active disease. Electronically Signed   By: Darliss Cheney M.D.   On: 05/26/2023 20:44   - pertinent xrays, CT, MRI studies were reviewed and independently interpreted  Positive ROS: All other systems have been reviewed and were otherwise negative with the exception of those mentioned in the HPI and as above.  Physical Exam: General: Alert, no acute distress Psychiatric: Patient is competent for consent with normal mood and affect Lymphatic: No axillary or cervical lymphadenopathy Cardiovascular: No pedal edema Respiratory: No cyanosis, no use of accessory musculature GI: No organomegaly, abdomen is soft and non-tender    Images:  @ENCIMAGES @  Labs:  Lab Results  Component Value Date   HGBA1C 5.8 (H) 05/27/2023   HGBA1C 5.2 03/11/2023   HGBA1C 5.4 07/09/2022   REPTSTATUS PENDING 05/26/2023   GRAMSTAIN  01/13/2017    MODERATE WBC PRESENT, PREDOMINANTLY PMN FEW SQUAMOUS EPITHELIAL CELLS PRESENT MODERATE GRAM POSITIVE COCCI IN PAIRS FEW GRAM NEGATIVE COCCOBACILLI FEW GRAM POSITIVE RODS    CULT  05/26/2023    NO GROWTH < 12 HOURS Performed at [facility], [address] [place], [place] [address]    LABORGA STAPHYLOCOCCUS SPECIES (COAGULASE NEGATIVE) (A) 02/23/2017    Lab Results  Component Value Date   ALBUMIN 2.4 (L) 05/26/2023   ALBUMIN 4.0 03/11/2023   ALBUMIN 3.6 09/29/2022        Latest Ref Rng & Units 05/27/2023    6:59 AM 05/26/2023    8:23 PM 11/12/2022   10:17 AM  CBC EXTENDED  WBC 4.0 - 10.5 K/uL 10.0  11.0  5.4   RBC 4.22 - 5.81 MIL/uL 3.04  3.10  4.26   Hemoglobin 13.0 - 17.0 g/dL 8.9  9.1  52.8   HCT 41.3 - 52.0 % 26.0  26.7  37.8   Platelets 150 - 400 K/uL 379  375  242   NEUT# 1.7 - 7.7 K/uL  9.5    Lymph# 0.7 - 4.0 K/uL  0.6      Neurologic: Patient does not have protective sensation bilateral lower extremities.   MUSCULOSKELETAL:   Skin: [place] patient has chronic ulceration of the toes of the left foot worse on the second  toe.  Patient has a faintly palpable dorsalis pedis pulse.  Ankle-brachial indices shows noncompressible flow at the ankle with adequate great toe pressure on the right.  Great toe pressure not available on the left.  Review of the radiographs shows calcification of arteries throughout the foot consistent with severe peripheral  vascular disease no air in the soft tissue.  Hemoglobin 8.9 with a white blood cell count of 10.  Albumin 2.4.  Hemoglobin A1c 5.8.  Assessment: Assessment: Diabetes with severe peripheral vascular disease with protein caloric malnutrition with necrotic abscess of the left forefoot.  Plan: Plan: Will plan for a left transmetatarsal amputation.  Risks and benefits were discussed including risk of the wound not healing need for additional surgery.  Patient states he understands.  Plan for surgery on Sunday morning.  Thank you for the consult and the opportunity to see Mr. Sheppard Plumber  Aldean Baker, MD [facility] [phone] 6:12 PM

## 2023-05-27 NOTE — Evaluation (Signed)
Physical Therapy Evaluation Patient Details Name: [name] MRN: [id number] DOB: 1966/07/24 Today's Date: 05/27/2023  History of Present Illness  Pt is a 57 year old male admitted for evaluation of being unable to get up and ambulate, and not eating for 2 days. 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  Clinical Impression  Pt presents with admitting diagnosis above. PTA pt reports that he lives at home with a friend and was independent with no AD. Today pt was Mod I for bed mobility however when instructed to stand pt would stand for 2 seconds then immediately lay back down. When asked through interpreter if pt was in pain and why he laid back down, Pt reported that he was not in any pain but just felt weak. Pt is noted to have a history of a TBI with residual cognitive deficits and was very impulsive today with mobility. Anticipate that pt can DC home with HHPT and WC/BSC. PT will continue to follow.       Assistance Recommended at Discharge Intermittent Supervision/Assistance  If plan is discharge home, recommend the following:  Can travel by private vehicle  A little help with walking and/or transfers;A little help with bathing/dressing/bathroom;Assistance with cooking/housework;Direct supervision/assist for medications management;Assist for transportation;Help with stairs or ramp for entrance        Equipment Recommendations Wheelchair (measurements PT);Wheelchair cushion (measurements PT);BSC/3in1  Recommendations for Other Services       Functional Status Assessment Patient has had a recent decline in their functional status and demonstrates the ability to make significant improvements in function in a reasonable and predictable amount of time.     Precautions / Restrictions Precautions Precautions:  Fall Restrictions Weight Bearing Restrictions: No      Mobility  Bed Mobility Overal bed mobility: Modified Independent             General bed mobility comments: HOB elevated    Transfers Overall transfer level: Needs assistance Equipment used: Rolling walker (2 wheels) Transfers: Sit to/from Stand Sit to Stand: Supervision           General transfer comment: Pt able to stand x2 with supervision. Cues for hand placement. On initial STS pt spontaneously sat down without command.    Ambulation/Gait Ambulation/Gait assistance: Min guard Gait Distance (Feet): 5 Feet Assistive device: Rolling walker (2 wheels) Gait Pattern/deviations: Decreased stance time - left, Step-to pattern, Decreased stride length, Trunk flexed, Staggering right, Staggering left Gait velocity: decreased     General Gait Details: Pt required cues for proximity and safety with RW. After taking a few steps pt panicked and ran back to bed completely ditching RW. When asked why through interpreter pt said that he felt weak.  Stairs            Wheelchair Mobility     Tilt Bed    Modified Rankin (Stroke Patients Only)       Balance Overall balance assessment: Needs assistance Sitting-balance support: No upper extremity supported, Feet supported Sitting balance-Leahy Scale: Normal     Standing balance support: Bilateral upper extremity supported, During functional activity Standing balance-Leahy Scale: Poor Standing balance comment: Reliant on RW                             Pertinent Vitals/Pain Pain Assessment Pain Assessment: No/denies pain    Home Living Family/patient  expects to be discharged to:: Private residence Living Arrangements: Non-relatives/Friends Available Help at Discharge: Friend(s);Available PRN/intermittently Type of Home: House Home Access: Level entry       Home Layout: One level Home Equipment: Agricultural consultant (2 wheels);Cane - single  point      Prior Function Prior Level of Function : Independent/Modified Independent             Mobility Comments: Ind no AD ADLs Comments: Ind     Hand Dominance   Dominant Hand: Right    Extremity/Trunk Assessment   Upper Extremity Assessment Upper Extremity Assessment: Generalized weakness    Lower Extremity Assessment Lower Extremity Assessment: Generalized weakness    Cervical / Trunk Assessment Cervical / Trunk Assessment: Normal  Communication   Communication: Interpreter utilized;Prefers language other than English Tommi Rumps)  Cognition Arousal/Alertness: Awake/alert Behavior During Therapy: Impulsive Overall Cognitive Status: No family/caregiver present to determine baseline cognitive functioning                                 General Comments: Pt had difficulty with following commands at times. When standing pt would spontaneosly ditch RW and huury back to bed.        General Comments General comments (skin integrity, edema, etc.): VSS    Exercises     Assessment/Plan    PT Assessment Patient needs continued PT services  PT Problem List Decreased strength;Decreased range of motion;Decreased activity tolerance;Decreased balance;Decreased mobility;Decreased coordination;Decreased knowledge of use of DME;Decreased safety awareness;Decreased knowledge of precautions;Pain       PT Treatment Interventions DME instruction;Gait training;Stair training;Functional mobility training;Therapeutic activities;Therapeutic exercise;Neuromuscular re-education;Balance training;Patient/family education    PT Goals (Current goals can be found in the Care Plan section)  Acute Rehab PT Goals Patient Stated Goal: to go home PT Goal Formulation: With patient Time For Goal Achievement: 06/10/23 Potential to Achieve Goals: Fair    Frequency Min 1X/week     Co-evaluation               AM-PAC PT "6 Clicks" Mobility  Outcome Measure Help  needed turning from your back to your side while in a flat bed without using bedrails?: None Help needed moving from lying on your back to sitting on the side of a flat bed without using bedrails?: None Help needed moving to and from a bed to a chair (including a wheelchair)?: A Little Help needed standing up from a chair using your arms (e.g., wheelchair or bedside chair)?: A Little Help needed to walk in hospital room?: A Little Help needed climbing 3-5 steps with a railing? : A Lot 6 Click Score: 19    End of Session Equipment Utilized During Treatment: Gait belt Activity Tolerance: Patient tolerated treatment well;Patient limited by fatigue Patient left: in bed;with call bell/phone within reach Nurse Communication: Mobility status PT Visit Diagnosis: Other abnormalities of gait and mobility (R26.89)    Time: 0981-1914 PT Time Calculation (min) (ACUTE ONLY): 17 min   Charges:   PT Evaluation $PT Eval Moderate Complexity: 1 Mod   PT General Charges $$ ACUTE PT VISIT: 1 Visit         Shela Nevin, PT, DPT Acute Rehab Services [phone]   [name] 05/27/2023, 3:47 PM

## 2023-05-27 NOTE — Progress Notes (Addendum)
Patient is not oriented to situation, pulled out both IV, per dayshift nurse, sister called to say patient cannot consent for anything due to history of traumatic brain injury.  At 1946, called Tiffany of [facility] that patient is not legally able to sign consent per sister due to history of TBI, he is currently disoriented to situation as well.  At 1951, dr Lajoyce Corners returned call, aware of situation, procedure on Sunday, 05/30/2023.

## 2023-05-27 NOTE — Consult Note (Signed)
ORTHOPAEDIC CONSULTATION  REQUESTING PHYSICIAN: Hughie Closs, MD  Chief Complaint: Necrotic ulceration toes left foot.  HPI: [name] is a 57 y.o. 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, Placement of bone flap in abdomen;  Surgeon: Coletta Memos, MD;  Location: [facility] OR;  Service: Neurosurgery;  Laterality: Right;   CRANIOTOMY Right 03/19/2017   Procedure: Cranioplasty with placement of bone flap from abdomen;  Surgeon: Tia Alert, MD;  Location: [facility] OR;  Service: Neurosurgery;  Laterality: Right;   ESOPHAGOGASTRODUODENOSCOPY (EGD) WITH PROPOFOL N/A 08/19/2021   Procedure: ESOPHAGOGASTRODUODENOSCOPY (EGD) WITH PROPOFOL;  Surgeon: Benancio Deeds, MD;  Location: [facility];  Service: Gastroenterology;  Laterality: N/A;   POLYPECTOMY  08/19/2021   Procedure: POLYPECTOMY;  Surgeon: Benancio Deeds, MD;  Location: [facility];  Service: Gastroenterology;;   Gaspar Bidding DILATION N/A 08/19/2021   Procedure: Gaspar Bidding DILATION;  Surgeon: Benancio Deeds, MD;  Location: [facility];  Service: Gastroenterology;  Laterality: N/A;   Social History   Socioeconomic History   Marital status: Single    Spouse name: Not on file   Number of children: 1   Years of education: 12   Highest education level: High school graduate  Occupational History   Occupation: not working  Tobacco Use   Smoking status: Never   Smokeless tobacco: Never  Vaping  Use   Vaping status: Never Used  Substance and Sexual Activity   Alcohol use: Not Currently    Comment: socially, not since accident   Drug use: No   Sexual activity: Not on file  Other Topics Concern   Not on file  Social History Narrative   Lives with sister and her family.  Has one daughter in [place].  Does not work.  Education: high school.    Right handed    Social Determinants of Health   Financial Resource Strain: Not on file  Food Insecurity: Not on file  Transportation Needs: Not on file  Physical Activity: Not on file  Stress: Not on file  Social Connections: Not on file   Family History  Problem Relation Age of Onset   Diabetes Mother    Diabetes Father    - negative except otherwise stated in the family history section Allergies  Allergen Reactions   Latex Itching and Rash   Prior to Admission medications   Medication Sig Start Date End Date Taking? Authorizing Provider  atorvastatin (LIPITOR) 10 MG tablet Take 1 tablet (10 mg total) by mouth daily. 03/11/23  Yes Marcine Matar, MD  ferrous sulfate 324 MG TBEC Take 324 mg by mouth daily with breakfast.   Yes [provider]  FLUoxetine (PROZAC) 10 MG capsule Take 3 capsules (30 mg total) by mouth daily. 03/11/23  Yes Marcine Matar, MD  hydrOXYzine (ATARAX) 10 MG tablet Take 1 tablet (10 mg total) by mouth 3 (three) times daily as needed. Patient taking differently: Take 10 mg by mouth 3 (three) times daily as needed for anxiety or  itching. 12/29/21  Yes Toy Cookey E, NP  levETIRAcetam (KEPPRA) 500 MG tablet Take 1.5 tablets (750mg ) by mouth twice daily. Patient taking differently: Take 750 mg by mouth 2 (two) times daily. 03/11/23  Yes Marcine Matar, MD  lisinopril (ZESTRIL) 2.5 MG tablet Take 1 tablet (2.5 mg total) by mouth daily. 03/11/23  Yes Marcine Matar, MD  metFORMIN (GLUCOPHAGE) 1000 MG tablet Take 1 tablet (1,000 mg total) by mouth 2 (two) times daily with a meal. 03/11/23  Yes  Marcine Matar, MD  risperiDONE (RISPERDAL) 0.25 MG tablet Take 1 tablet (0.25 mg total) by mouth at bedtime. 02/18/23  Yes Van Clines, MD  vitamin B-12 (CYANOCOBALAMIN) 100 MCG tablet Take 100 mcg by mouth daily.   Yes [provider]   VAS [place] ABI WITH/WO TBI  Result Date: 05/27/2023  LOWER EXTREMITY DOPPLER STUDY Patient Name:  [name]  Date of Exam:   05/27/2023 Medical Rec #: [id number]               Accession #:    [id number] Date of Birth: 1966/03/09              Patient Gender: M Patient Age:   6 years Exam Location:  [facility] Procedure:      VAS [place] ABI WITH/WO TBI Referring Phys: TIMOTHY OPYD --------------------------------------------------------------------------------  Indications: Ulceration, and gangrene. High Risk Factors: Diabetes. Other Factors: Traumatic brain injury.  Limitations: Today's exam was limited due to bandages and an open wound. Comparison Study: No prior study on file Performing Technologist: Sherren Kerns RVS  Examination Guidelines: A complete evaluation includes at minimum, Doppler waveform signals and systolic blood pressure reading at the level of bilateral brachial, anterior tibial, and posterior tibial arteries, when vessel segments are accessible. Bilateral testing is considered an integral part of a complete examination. Photoelectric Plethysmograph (PPG) waveforms and toe systolic pressure readings are included as required and additional duplex testing as needed. Limited examinations for reoccurring indications may be performed as noted.  ABI Findings: +---------+------------------+-----+-----------+--------+ Right    Rt Pressure (mmHg)IndexWaveform   Comment  +---------+------------------+-----+-----------+--------+ Brachial 147                    triphasic           +---------+------------------+-----+-----------+--------+ PTA      254               1.65 multiphasic          +---------+------------------+-----+-----------+--------+ DP       254               1.65 multiphasic         +---------+------------------+-----+-----------+--------+ Great Toe98                0.64                     +---------+------------------+-----+-----------+--------+ +--------+------------------+-----+-----------+-------+ Left    Lt Pressure (mmHg)IndexWaveform   Comment +--------+------------------+-----+-----------+-------+ [id number]                    triphasic          +--------+------------------+-----+-----------+-------+ PTA     254               1.65 multiphasic        +--------+------------------+-----+-----------+-------+ DP      254               1.65 multiphasic        +--------+------------------+-----+-----------+-------+  Summary: Right: Resting right ankle-brachial index indicates noncompressible right lower extremity arteries. The right toe-brachial index is abnormal. Left: Resting left ankle-brachial index indicates noncompressible left lower extremity arteries. Unable to attain TBI secondary to ulceration/gangrene and bandages. *See table(s) above for measurements and observations.  Electronically signed by Gerarda Fraction on 05/27/2023 at 6:09:57 PM.    Final    DG Foot Complete Left  Result Date: 05/26/2023 CLINICAL DATA:  Pain EXAM: LEFT FOOT - COMPLETE 3+ VIEW COMPARISON:  None available FINDINGS: Diffuse vascular calcifications. Plantar calcaneal spur. No acute bony abnormality. Specifically, no fracture, subluxation, or dislocation. IMPRESSION: No acute bony abnormality. Electronically Signed   By: Charlett Nose M.D.   On: 05/26/2023 20:48   DG Chest Port 1 View  Result Date: 05/26/2023 CLINICAL DATA:  Sepsis EXAM: PORTABLE CHEST 1 VIEW COMPARISON:  Chest x-ray 05/10/2018 FINDINGS: The heart size and mediastinal contours are within normal limits. Both lungs are clear. The visualized skeletal structures are unremarkable. IMPRESSION: No  active disease. Electronically Signed   By: Darliss Cheney M.D.   On: 05/26/2023 20:44   - pertinent xrays, CT, MRI studies were reviewed and independently interpreted  Positive ROS: All other systems have been reviewed and were otherwise negative with the exception of those mentioned in the HPI and as above.  Physical Exam: General: Alert, no acute distress Psychiatric: Patient is competent for consent with normal mood and affect Lymphatic: No axillary or cervical lymphadenopathy Cardiovascular: No pedal edema Respiratory: No cyanosis, no use of accessory musculature GI: No organomegaly, abdomen is soft and non-tender    Images:  @ENCIMAGES @  Labs:  Lab Results  Component Value Date   HGBA1C 5.8 (H) 05/27/2023   HGBA1C 5.2 03/11/2023   HGBA1C 5.4 07/09/2022   REPTSTATUS PENDING 05/26/2023   GRAMSTAIN  01/13/2017    MODERATE WBC PRESENT, PREDOMINANTLY PMN FEW SQUAMOUS EPITHELIAL CELLS PRESENT MODERATE GRAM POSITIVE COCCI IN PAIRS FEW GRAM NEGATIVE COCCOBACILLI FEW GRAM POSITIVE RODS    CULT  05/26/2023    NO GROWTH < 12 HOURS Performed at [facility], [address] [place], [place] [address]    LABORGA STAPHYLOCOCCUS SPECIES (COAGULASE NEGATIVE) (A) 02/23/2017    Lab Results  Component Value Date   ALBUMIN 2.4 (L) 05/26/2023   ALBUMIN 4.0 03/11/2023   ALBUMIN 3.6 09/29/2022        Latest Ref Rng & Units 05/27/2023    6:59 AM 05/26/2023    8:23 PM 11/12/2022   10:17 AM  CBC EXTENDED  WBC 4.0 - 10.5 K/uL 10.0  11.0  5.4   RBC 4.22 - 5.81 MIL/uL 3.04  3.10  4.26   Hemoglobin 13.0 - 17.0 g/dL 8.9  9.1  30.8   HCT 65.7 - 52.0 % 26.0  26.7  37.8   Platelets 150 - 400 K/uL 379  375  242   NEUT# 1.7 - 7.7 K/uL  9.5    Lymph# 0.7 - 4.0 K/uL  0.6      Neurologic: Patient does not have protective sensation bilateral lower extremities.   MUSCULOSKELETAL:   Skin: [place] patient has chronic ulceration of the toes of the left foot worse on the second  toe.  Patient has a faintly palpable dorsalis pedis pulse.  Ankle-brachial indices shows noncompressible flow at the ankle with adequate great toe pressure on the right.  Great toe pressure not available on the left.  Review of the radiographs shows calcification of arteries throughout the foot consistent with severe peripheral  vascular disease no air in the soft tissue.  Hemoglobin 8.9 with a white blood cell count of 10.  Albumin 2.4.  Hemoglobin A1c 5.8.  Assessment: Assessment: Diabetes with severe peripheral vascular disease with protein caloric malnutrition with necrotic abscess of the left forefoot.  Plan: Plan: Will plan for a left transmetatarsal amputation.  Risks and benefits were discussed including risk of the wound not healing need for additional surgery.  Patient states he understands.  Plan for surgery on Sunday morning.  Thank you for the consult and the opportunity to see Mr. Sheppard Plumber  Aldean Baker, MD [facility] [phone] 6:12 PM

## 2023-05-27 NOTE — Progress Notes (Signed)
PROGRESS NOTE    [name]  ZOX:[id number] DOB: Oct 25, 1966 DOA: 05/26/2023 PCP: Jamie Matar, MD   Brief Narrative:  HPI: [name] is a 57 y.o. 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   Patient has been staying with a friend who works 2 jobs and is gone most of the time.  He has noticed that the patient has not been ambulating as usual for the past 2 days and has not been eating.  Patient's friend also notes that he has had difficulty adequately caring for the patient and is hoping to find some help.  Patient himself has no acute complaints.     ED Course: Upon arrival to the ED, patient is found to be afebrile and saturating well on room air with normal heart rate and stable blood pressure.  Chest x-rays and plain radiographs of the left foot are negative for acute findings.  Labs are notable for WBC 11,000, lactic acid 0.9, hemoglobin 9.1, and albumin 2.4.    Blood cultures were collected in the ED and the patient was given 2 L of NS and 2 g IV Rocephin.  Assessment & Plan:   Principal Problem:   Cellulitis of left foot Active Problems:   Essential hypertension   Type 2 diabetes mellitus with peripheral neuropathy (HCC)   Seizure disorder (HCC)   MDD (major depressive disorder), recurrent episode, moderate (HCC)   History of traumatic brain injury  1. Left foot cellulitis/dry gangrene/possible osteomyelitis: No acute osseous abnormality on plain radiographs but highly suspicious for osteomyelitis as he does appear to have wet gangrene which is very foul-smelling.  MRI is still pending but out of very high suspicion, I have already consulted Dr. Lajoyce Corners.  Continue antibiotics in the meantime.  Please see  pictures below from today.   2. Hx of TBI; depression; aggression  - Continue Prozac and Risperdal, use delirium precautions    3. Type II DM  - A1c was only 5.2% in May 2024.  Appears to be taking metformin at home which is on hold and continue SSI, blood sugar controlled.   4. Seizure disorder  - Continue Keppra   #5 hyperlipidemia: Continue atorvastatin.  #6 hypertension: Controlled.  Continue lisinopril.       DVT prophylaxis: enoxaparin (LOVENOX) injection 40 mg Start: 05/27/23 1000   Code Status: Full Code  Family Communication:  None present at bedside.  Plan of care discussed with patient in length and he/she verbalized understanding and agreed with it.  Status is: Inpatient Remains inpatient appropriate because: Patient with likely osteomyelitis, pending MRI and orthopedic evaluation.   Estimated body mass index is 22.02 kg/m as calculated from the following:   Height as of 03/11/23: 5\' 2"  (1.575 m).   Weight as of this encounter: 54.6 kg.    Nutritional Assessment: Body mass index is 22.02 kg/m.Marland Kitchen Seen by dietician.  I agree with the assessment and plan as outlined below: Nutrition Status:        . Skin Assessment: I have examined the patient's skin and I agree with the wound assessment as performed by the wound care RN as outlined below:    Consultants:  Ortho   Procedures:  As above  Antimicrobials:  Anti-infectives (From admission, onward)    Start  Dose/Rate Route Frequency Ordered Stop   05/26/23 2030  cefTRIAXone (ROCEPHIN) 2 g in sodium chloride 0.9 % 100 mL IVPB        2 g 200 mL/hr over 30 Minutes Intravenous Every 24 hours 05/26/23 2026 06/02/23 1759         Subjective: Patient seen and examined.  He is very comfortable and has no complaints at all.  Objective: Vitals:   05/26/23 2312 05/26/23 2321 05/27/23 0603 05/27/23 0803  BP: (!) 141/74  135/84 137/80  Pulse: 78  81 76  Resp: 18  14 17   Temp: 99.1 F (37.3 C)  97.9  F (36.6 C) 98.3 F (36.8 C)  TempSrc: Oral   Oral  SpO2: 99%  99% 100%  Weight:  54.6 kg      Intake/Output Summary (Last 24 hours) at 05/27/2023 1240 Last data filed at 05/27/2023 0754 Gross per 24 hour  Intake 100 ml  Output 400 ml  Net -300 ml   Filed Weights   05/26/23 2321  Weight: 54.6 kg    Examination:  General exam: Appears calm and comfortable  Respiratory system: Clear to auscultation. Respiratory effort normal. Cardiovascular system: S1 & S2 heard, RRR. No JVD, murmurs, rubs, gallops or clicks. No pedal edema. Gastrointestinal system: Abdomen is nondistended, soft and nontender. No organomegaly or masses felt. Normal bowel sounds heard. Central nervous system: Alert and oriented. No focal neurological deficits. Extremities: Dry gangrene left foot, pictures above.  Data Reviewed: I have personally reviewed following labs and imaging studies  CBC: Recent Labs  Lab 05/26/23 2023 05/27/23 0659  WBC 11.0* 10.0  NEUTROABS 9.5*  --   HGB 9.1* 8.9*  HCT 26.7* 26.0*  MCV 86.1 85.5  PLT 375 379   Basic Metabolic Panel: Recent Labs  Lab 05/26/23 2023 05/27/23 0659  NA 136 136  K 3.7 3.5  CL 100 100  CO2 26 24  GLUCOSE 118* 103*  BUN 16 15  CREATININE 0.83 0.81  CALCIUM 8.5* 8.2*   GFR: Estimated Creatinine Clearance: 77.7 mL/min (by C-G formula based on SCr of 0.81 mg/dL). Liver Function Tests: Recent Labs  Lab 05/26/23 2023  AST 14*  ALT 17  ALKPHOS 59  BILITOT 0.4  PROT 6.9  ALBUMIN 2.4*   No results for input(s): "LIPASE", "AMYLASE" in the last 168 hours. No results for input(s): "AMMONIA" in the last 168 hours. Coagulation Profile: Recent Labs  Lab 05/26/23 2023  INR 1.2   Cardiac Enzymes: No results for input(s): "CKTOTAL", "CKMB", "CKMBINDEX", "TROPONINI" in the last 168 hours. BNP (last 3 results) No results for input(s): "PROBNP" in the last 8760 hours. HbA1C: Recent Labs    05/27/23 0659  HGBA1C 5.8*   CBG: Recent Labs   Lab 05/26/23 2300 05/27/23 0802 05/27/23 1115  GLUCAP 120* 85 81   Lipid Profile: No results for input(s): "CHOL", "HDL", "LDLCALC", "TRIG", "CHOLHDL", "LDLDIRECT" in the last 72 hours. Thyroid Function Tests: No results for input(s): "TSH", "T4TOTAL", "FREET4", "T3FREE", "THYROIDAB" in the last 72 hours. Anemia Panel: No results for input(s): "VITAMINB12", "FOLATE", "FERRITIN", "TIBC", "IRON", "RETICCTPCT" in the last 72 hours. Sepsis Labs: Recent Labs  Lab 05/26/23 2045  LATICACIDVEN 0.9    Recent Results (from the past 240 hour(s))  Blood Culture (routine x 2)     Status: None (Preliminary result)   Collection Time: 05/26/23  8:23 PM   Specimen: BLOOD RIGHT FOREARM  Result Value Ref Range Status   Specimen Description BLOOD RIGHT FOREARM  Final  Special Requests   Final    BOTTLES DRAWN AEROBIC AND ANAEROBIC Blood Culture adequate volume   Culture   Final    NO GROWTH < 12 HOURS Performed at [facility], [address] [place], [place] [address]    Report Status PENDING  Incomplete  Resp panel by RT-PCR (RSV, Flu A&B, Covid) Anterior Nasal Swab     Status: None   Collection Time: 05/26/23  8:26 PM   Specimen: Anterior Nasal Swab  Result Value Ref Range Status   SARS Coronavirus 2 by RT PCR NEGATIVE NEGATIVE Final   Influenza A by PCR NEGATIVE NEGATIVE Final   Influenza B by PCR NEGATIVE NEGATIVE Final    Comment: (NOTE) The Xpert Xpress SARS-CoV-2/FLU/RSV plus assay is intended as an aid in the diagnosis of influenza from Nasopharyngeal swab specimens and should not be used as a sole basis for treatment. Nasal washings and aspirates are unacceptable for Xpert Xpress SARS-CoV-2/FLU/RSV testing.  Fact Sheet for Patients: BloggerCourse.com  Fact Sheet for Healthcare Providers: SeriousBroker.it  This test is not yet approved or cleared by the [place] FDA and has been authorized for detection and/or  diagnosis of SARS-CoV-2 by FDA under an Emergency Use Authorization (EUA). This EUA will remain in effect (meaning this test can be used) for the duration of the COVID-19 declaration under Section 564(b)(1) of the Act, 21 U.S.C. section 360bbb-3(b)(1), unless the authorization is terminated or revoked.     Resp Syncytial Virus by PCR NEGATIVE NEGATIVE Final    Comment: (NOTE) Fact Sheet for Patients: BloggerCourse.com  Fact Sheet for Healthcare Providers: SeriousBroker.it  This test is not yet approved or cleared by the [place] FDA and has been authorized for detection and/or diagnosis of SARS-CoV-2 by FDA under an Emergency Use Authorization (EUA). This EUA will remain in effect (meaning this test can be used) for the duration of the COVID-19 declaration under Section 564(b)(1) of the Act, 21 U.S.C. section 360bbb-3(b)(1), unless the authorization is terminated or revoked.  Performed at [facility], [address], [place], [place] [address]   Blood Culture (routine x 2)     Status: None (Preliminary result)   Collection Time: 05/26/23  8:31 PM   Specimen: BLOOD  Result Value Ref Range Status   Specimen Description BLOOD RIGHT ANTECUBITAL  Final   Special Requests   Final    BOTTLES DRAWN AEROBIC AND ANAEROBIC Blood Culture adequate volume   Culture   Final    NO GROWTH < 12 HOURS Performed at [facility], [address], [place], [place] [address]    Report Status PENDING  Incomplete     Radiology Studies: VAS [place] ABI WITH/WO TBI  Result Date: 05/27/2023  LOWER EXTREMITY DOPPLER STUDY Patient Name:  SIDNEY KANN  Date of Exam:   05/27/2023 Medical Rec #: [id number]               Accession #:    [id number] Date of Birth: [birth year]              Patient Gender: M Patient Age:   36 years Exam Location:  [facility] Procedure:      VAS [place] ABI WITH/WO TBI Referring Phys: TIMOTHY OPYD  --------------------------------------------------------------------------------  Indications: Ulceration, and gangrene. High Risk Factors: Diabetes. Other Factors: Traumatic brain injury.  Limitations: Today's exam was limited due to bandages and an open wound. Comparison Study: No prior study on file Performing Technologist: Sherren Kerns RVS  Examination Guidelines: A complete evaluation includes at minimum, Doppler waveform signals and systolic blood pressure reading at the level of bilateral brachial, anterior tibial, and posterior tibial arteries, when vessel segments are accessible. Bilateral testing is considered an integral part of a complete examination. Photoelectric Plethysmograph (PPG) waveforms and toe systolic pressure readings are included as required and additional duplex testing as needed. Limited examinations for reoccurring indications may be performed as noted.  ABI Findings: +---------+------------------+-----+-----------+--------+ Right    Rt Pressure (mmHg)IndexWaveform   Comment  +---------+------------------+-----+-----------+--------+ Brachial 147                    triphasic           +---------+------------------+-----+-----------+--------+ PTA      254               1.65 multiphasic         +---------+------------------+-----+-----------+--------+ DP       254               1.65 multiphasic         +---------+------------------+-----+-----------+--------+ Great Toe98                0.64                     +---------+------------------+-----+-----------+--------+ +--------+------------------+-----+-----------+-------+ Left    Lt Pressure (mmHg)IndexWaveform   Comment +--------+------------------+-----+-----------+-------+ [id number]                    triphasic          +--------+------------------+-----+-----------+-------+ PTA     254               1.65 multiphasic        +--------+------------------+-----+-----------+-------+ DP       254               1.65 multiphasic        +--------+------------------+-----+-----------+-------+  Summary: Right: Resting right ankle-brachial index indicates noncompressible right lower extremity arteries. The right toe-brachial index is abnormal. Left: Resting left ankle-brachial index indicates noncompressible left lower extremity arteries. Unable to attain TBI secondary to ulceration/gangrene and bandages. *See table(s) above for measurements and observations.     Preliminary    DG Foot Complete Left  Result Date: 05/26/2023 CLINICAL DATA:  Pain EXAM: LEFT FOOT - COMPLETE 3+ VIEW COMPARISON:  None available FINDINGS: Diffuse vascular calcifications. Plantar calcaneal spur. No acute bony abnormality. Specifically, no fracture, subluxation, or dislocation. IMPRESSION: No acute bony abnormality. Electronically Signed   By: Charlett Nose M.D.   On: 05/26/2023 20:48   DG Chest Port 1 View  Result Date: 05/26/2023 CLINICAL DATA:  Sepsis EXAM: PORTABLE CHEST 1 VIEW COMPARISON:  Chest x-ray 05/10/2018 FINDINGS: The heart size and mediastinal contours are within normal limits. Both lungs are clear. The visualized skeletal structures are unremarkable. IMPRESSION: No active disease. Electronically Signed   By: Darliss Cheney M.D.   On: 05/26/2023 20:44    Scheduled Meds:  atorvastatin  10 mg Oral Daily   enoxaparin (LOVENOX) injection  40 mg Subcutaneous Daily   FLUoxetine  30 mg Oral Daily   insulin aspart  0-5 Units Subcutaneous QHS   insulin aspart  0-6 Units Subcutaneous TID WC   levETIRAcetam  500 mg Oral BID   lisinopril  2.5 mg Oral Daily   risperiDONE  0.25 mg Oral QHS   Continuous Infusions:  cefTRIAXone (ROCEPHIN)  IV Stopped (05/26/23 2138)     LOS: 1 day  Hughie Closs, MD [facility]  05/27/2023, 12:40 PM   *Please note that this is a verbal dictation therefore any spelling or grammatical errors are due to the "Dragon Medical One" system interpretation.  Please page  via Amion and do not message via secure chat for urgent patient care matters. Secure chat can be used for non urgent patient care matters.  How to contact the [facility] Attending or Consulting provider 7A - 7P or covering provider during after hours 7P -7A, for this patient?  Check the care team in [facility] and look for a) attending/consulting [facility] provider listed and b) the [facility] team listed. Page or secure chat 7A-7P. Log into [email or web address] and use [place]'s universal password to access. If you do not have the password, please contact the hospital operator. Locate the [facility] provider you are looking for under [facility] and page to a number that you can be directly reached. If you still have difficulty reaching the provider, please page the [facility] (Director on Call) for the Hospitalists listed on amion for assistance.

## 2023-05-27 NOTE — Plan of Care (Signed)

## 2023-05-27 NOTE — Plan of Care (Signed)
  Problem: Health Behavior/Discharge Planning: Goal: Ability to identify and utilize available resources and services will improve Outcome: Progressing Goal: Ability to manage health-related needs will improve Outcome: Progressing   Problem: Clinical Measurements: Goal: Respiratory complications will improve Outcome: Progressing Goal: Cardiovascular complication will be avoided Outcome: Progressing

## 2023-05-27 NOTE — Progress Notes (Signed)
With the aid of a translator, the pt was told what to expect and how important it was to hold still. Pt started well but began moving a little. We went in to speak to pt about motion but when exam was continued pt began moving more. We tried using motion reduction techniques but at this point pt was almost climbing off the table. Images were undiagnostic  and pt's behavior was becoming unsafe.

## 2023-05-28 ENCOUNTER — Inpatient Hospital Stay (HOSPITAL_COMMUNITY): Payer: Self-pay

## 2023-05-28 LAB — GLUCOSE, CAPILLARY
Glucose-Capillary: 172 mg/dL — ABNORMAL HIGH (ref 70–99)
Glucose-Capillary: 210 mg/dL — ABNORMAL HIGH (ref 70–99)
Glucose-Capillary: 134 mg/dL — ABNORMAL HIGH (ref 70–99)
Glucose-Capillary: 200 mg/dL — ABNORMAL HIGH (ref 70–99)

## 2023-05-28 MED ORDER — VANCOMYCIN HCL 1250 MG/250ML IV SOLN: 1250.0000 mg | INTRAVENOUS | Status: DC

## 2023-05-28 MED ORDER — CHLORHEXIDINE GLUCONATE CLOTH 2 % EX PADS
6.0000 | MEDICATED_PAD | Freq: Every day | CUTANEOUS | Status: AC
  Administered 2023-05-28 – 2023-06-01 (×4): 6 via TOPICAL

## 2023-05-28 MED ORDER — MUPIROCIN 2 % EX OINT
1.0000 | TOPICAL_OINTMENT | Freq: Two times a day (BID) | CUTANEOUS | Status: AC
  Administered 2023-05-28 – 2023-06-01 (×10): 1 via NASAL
  Filled 2023-05-28 (×3): qty 22

## 2023-05-28 MED ORDER — VANCOMYCIN HCL 1250 MG/250ML IV SOLN
1250.0000 mg | INTRAVENOUS | Status: DC
  Administered 2023-05-28 – 2023-06-01 (×5): 1250 mg via INTRAVENOUS
  Filled 2023-05-28 (×5): qty 250

## 2023-05-28 NOTE — Progress Notes (Signed)
Pharmacy Antibiotic Note  [name] is a 57 y.o. male admitted on 05/26/2023 with  likely osteo .  Pharmacy has been consulted for vanc dosing.  Pt was admitted for necrotic toes. Plan to do TMA on Sunday. He was started on ceftriaxone. Vanc ordered to be added today.  Scr 0.88  Plan: Vanc 1.25g IV q24>>AUC 491, scr 0.88 F/u post TMA Levels as needed  Weight: 54.6 kg (120 lb 5.9 oz)  Temp (24hrs), Avg:98.3 F (36.8 C), Min:97.7 F (36.5 C), Max:98.6 F (37 C)  Recent Labs  Lab 05/26/23 2023 05/26/23 2045 05/27/23 0659 05/28/23 0208  WBC 11.0*  --  10.0 9.8  CREATININE 0.83  --  0.81 0.88  LATICACIDVEN  --  0.9  --   --     Estimated Creatinine Clearance: 71.5 mL/min (by C-G formula based on SCr of 0.88 mg/dL).    Allergies  Allergen Reactions   Latex Itching and Rash    Antimicrobials this admission: 7/26 vanc>> 7/24 ceftriaxone>>  Dose adjustments this admission:   Microbiology results: 7/24 blood>>ngtd MRSA+  Ulyses Southward, PharmD, BCIDP, AAHIVP, CPP Infectious Disease Pharmacist 05/28/2023 10:53 AM

## 2023-05-28 NOTE — ED Provider Notes (Signed)
[facility] Provider Note   CSN: [id number] Arrival date & time: 05/26/23  1957     History  Chief Complaint  Patient presents with   Weakness    [name] is a 57 y.o. male.  Patient has a history of diabetes and traumatic brain injury.  He was sent here by a friend because of left foot swelling and tenderness  The history is provided by the patient and medical records. No language interpreter was used.  Weakness Severity:  Mild Onset quality:  Sudden Timing:  Constant Progression:  Waxing and waning Chronicity:  Recurrent Context: not alcohol use   Relieved by:  Nothing Worsened by:  Nothing      Home Medications Prior to Admission medications   Medication Sig Start Date End Date Taking? Authorizing Provider  atorvastatin (LIPITOR) 10 MG tablet Take 1 tablet (10 mg total) by mouth daily. 03/11/23  Yes Marcine Matar, MD  ferrous sulfate 324 MG TBEC Take 324 mg by mouth daily with breakfast.   Yes [provider]  FLUoxetine (PROZAC) 10 MG capsule Take 3 capsules (30 mg total) by mouth daily. 03/11/23  Yes Marcine Matar, MD  hydrOXYzine (ATARAX) 10 MG tablet Take 1 tablet (10 mg total) by mouth 3 (three) times daily as needed. Patient taking differently: Take 10 mg by mouth 3 (three) times daily as needed for anxiety or itching. 12/29/21  Yes Toy Cookey E, NP  levETIRAcetam (KEPPRA) 500 MG tablet Take 1.5 tablets (750mg ) by mouth twice daily. Patient taking differently: Take 750 mg by mouth 2 (two) times daily. 03/11/23  Yes Marcine Matar, MD  lisinopril (ZESTRIL) 2.5 MG tablet Take 1 tablet (2.5 mg total) by mouth daily. 03/11/23  Yes Marcine Matar, MD  metFORMIN (GLUCOPHAGE) 1000 MG tablet Take 1 tablet (1,000 mg total) by mouth 2 (two) times daily with a meal. 03/11/23  Yes Marcine Matar, MD  risperiDONE (RISPERDAL) 0.25 MG tablet Take 1 tablet (0.25 mg total) by mouth at bedtime. 02/18/23   Yes Van Clines, MD  vitamin B-12 (CYANOCOBALAMIN) 100 MCG tablet Take 100 mcg by mouth daily.   Yes [provider]      Allergies    Latex    Review of Systems   Review of Systems  Unable to perform ROS: Mental status change  Neurological:  Positive for weakness.    Physical Exam Updated Vital Signs BP 109/62 (BP Location: Right Arm)   Pulse 78   Temp 98.5 F (36.9 C) (Oral)   Resp 16   Wt 54.6 kg   SpO2 99%   BMI 22.02 kg/m  Physical Exam Vitals and nursing note reviewed.  Constitutional:      Appearance: He is well-developed.  HENT:     Head: Normocephalic.     Nose: Nose normal.  Eyes:     General: No scleral icterus.    Conjunctiva/sclera: Conjunctivae normal.  Neck:     Thyroid: No thyromegaly.  Cardiovascular:     Rate and Rhythm: Normal rate and regular rhythm.     Heart sounds: No murmur heard.    No friction rub. No gallop.  Pulmonary:     Breath sounds: No stridor. No wheezing or rales.  Chest:     Chest wall: No tenderness.  Abdominal:     General: There is no distension.     Tenderness: There is no abdominal tenderness. There is no rebound.  Musculoskeletal:  General: Normal range of motion.     Cervical back: Neck supple.     Comments: Left foot swollen red and tender  Lymphadenopathy:     Cervical: No cervical adenopathy.  Skin:    Findings: No erythema or rash.  Neurological:     Mental Status: He is alert and oriented to person, place, and time.     Motor: No abnormal muscle tone.     Coordination: Coordination normal.  Psychiatric:        Behavior: Behavior normal.     ED Results / Procedures / Treatments   Labs (all labs ordered are listed, but only abnormal results are displayed) Labs Reviewed  SURGICAL PCR SCREEN - Abnormal; Notable for the following components:      Result Value   Staphylococcus aureus POSITIVE (*)    All other components within normal limits  COMPREHENSIVE METABOLIC PANEL - Abnormal;  Notable for the following components:   Glucose, Bld 118 (*)    Calcium 8.5 (*)    Albumin 2.4 (*)    AST 14 (*)    All other components within normal limits  CBC WITH DIFFERENTIAL/PLATELET - Abnormal; Notable for the following components:   WBC 11.0 (*)    RBC 3.10 (*)    Hemoglobin 9.1 (*)    HCT 26.7 (*)    RDW 11.3 (*)    Neutro Abs 9.5 (*)    Lymphs Abs 0.6 (*)    All other components within normal limits  PROTIME-INR - Abnormal; Notable for the following components:   Prothrombin Time 15.5 (*)    All other components within normal limits  APTT - Abnormal; Notable for the following components:   aPTT 39 (*)    All other components within normal limits  URINALYSIS, W/ REFLEX TO CULTURE (INFECTION SUSPECTED) - Abnormal; Notable for the following components:   Color, Urine AMBER (*)    Ketones, ur 5 (*)    Protein, ur 100 (*)    Bacteria, UA RARE (*)    All other components within normal limits  BASIC METABOLIC PANEL - Abnormal; Notable for the following components:   Glucose, Bld 103 (*)    Calcium 8.2 (*)    All other components within normal limits  CBC - Abnormal; Notable for the following components:   RBC 3.04 (*)    Hemoglobin 8.9 (*)    HCT 26.0 (*)    RDW 11.4 (*)    All other components within normal limits  HEMOGLOBIN A1C - Abnormal; Notable for the following components:   Hgb A1c MFr Bld 5.8 (*)    All other components within normal limits  BASIC METABOLIC PANEL - Abnormal; Notable for the following components:   Sodium 134 (*)    Chloride 97 (*)    Glucose, Bld 213 (*)    BUN 25 (*)    Calcium 8.2 (*)    All other components within normal limits  CBC - Abnormal; Notable for the following components:   RBC 2.93 (*)    Hemoglobin 8.6 (*)    HCT 25.3 (*)    RDW 11.2 (*)    All other components within normal limits  GLUCOSE, CAPILLARY - Abnormal; Notable for the following components:   Glucose-Capillary 192 (*)    All other components within normal  limits  GLUCOSE, CAPILLARY - Abnormal; Notable for the following components:   Glucose-Capillary 157 (*)    All other components within normal limits  GLUCOSE, CAPILLARY - Abnormal;  Notable for the following components:   Glucose-Capillary 172 (*)    All other components within normal limits  GLUCOSE, CAPILLARY - Abnormal; Notable for the following components:   Glucose-Capillary 210 (*)    All other components within normal limits  GLUCOSE, CAPILLARY - Abnormal; Notable for the following components:   Glucose-Capillary 134 (*)    All other components within normal limits  CBG MONITORING, ED - Abnormal; Notable for the following components:   Glucose-Capillary 120 (*)    All other components within normal limits  RESP PANEL BY RT-PCR (RSV, FLU A&B, COVID)  [id number]  CULTURE, BLOOD (ROUTINE X 2)  CULTURE, BLOOD (ROUTINE X 2)  HIV ANTIBODY (ROUTINE TESTING W REFLEX)  GLUCOSE, CAPILLARY  GLUCOSE, CAPILLARY  I-STAT CG4 LACTIC ACID, ED    EKG EKG Interpretation Date/Time:  Wednesday May 26 2023 20:10:56 EDT Ventricular Rate:  82 PR Interval:  145 QRS Duration:  85 QT Interval:  361 QTC Calculation: 422 R Axis:   71  Text Interpretation: Sinus rhythm Confirmed by Tanda Rockers ([id number]) on 05/27/2023 11:59:37 AM  Radiology VAS [place] ABI WITH/WO TBI  Result Date: 05/27/2023  LOWER EXTREMITY DOPPLER STUDY Patient Name:  DIEM STINTON  Date of Exam:   05/27/2023 Medical Rec #: [id number]               Accession #:    [id number] Date of Birth: [birth year]              Patient Gender: M Patient Age:   83 years Exam Location:  [facility] Procedure:      VAS [place] ABI WITH/WO TBI Referring Phys: TIMOTHY OPYD --------------------------------------------------------------------------------  Indications: Ulceration, and gangrene. High Risk Factors: Diabetes. Other Factors: Traumatic brain injury.  Limitations: Today's exam was limited due to bandages and an open wound. Comparison Study: No  prior study on file Performing Technologist: Sherren Kerns RVS  Examination Guidelines: A complete evaluation includes at minimum, Doppler waveform signals and systolic blood pressure reading at the level of bilateral brachial, anterior tibial, and posterior tibial arteries, when vessel segments are accessible. Bilateral testing is considered an integral part of a complete examination. Photoelectric Plethysmograph (PPG) waveforms and toe systolic pressure readings are included as required and additional duplex testing as needed. Limited examinations for reoccurring indications may be performed as noted.  ABI Findings: +---------+------------------+-----+-----------+--------+ Right    Rt Pressure (mmHg)IndexWaveform   Comment  +---------+------------------+-----+-----------+--------+ Brachial 147                    triphasic           +---------+------------------+-----+-----------+--------+ PTA      254               1.65 multiphasic         +---------+------------------+-----+-----------+--------+ DP       254               1.65 multiphasic         +---------+------------------+-----+-----------+--------+ Great Toe98                0.64                     +---------+------------------+-----+-----------+--------+ +--------+------------------+-----+-----------+-------+ Left    Lt Pressure (mmHg)IndexWaveform   Comment +--------+------------------+-----+-----------+-------+ [id number]                    triphasic          +--------+------------------+-----+-----------+-------+ PTA  254               1.65 multiphasic        +--------+------------------+-----+-----------+-------+ DP      254               1.65 multiphasic        +--------+------------------+-----+-----------+-------+  Summary: Right: Resting right ankle-brachial index indicates noncompressible right lower extremity arteries. The right toe-brachial index is abnormal. Left: Resting left  ankle-brachial index indicates noncompressible left lower extremity arteries. Unable to attain TBI secondary to ulceration/gangrene and bandages. *See table(s) above for measurements and observations.  Electronically signed by Gerarda Fraction on 05/27/2023 at 6:09:57 PM.    Final    DG Foot Complete Left  Result Date: 05/26/2023 CLINICAL DATA:  Pain EXAM: LEFT FOOT - COMPLETE 3+ VIEW COMPARISON:  None available FINDINGS: Diffuse vascular calcifications. Plantar calcaneal spur. No acute bony abnormality. Specifically, no fracture, subluxation, or dislocation. IMPRESSION: No acute bony abnormality. Electronically Signed   By: Charlett Nose M.D.   On: 05/26/2023 20:48   DG Chest Port 1 View  Result Date: 05/26/2023 CLINICAL DATA:  Sepsis EXAM: PORTABLE CHEST 1 VIEW COMPARISON:  Chest x-ray 05/10/2018 FINDINGS: The heart size and mediastinal contours are within normal limits. Both lungs are clear. The visualized skeletal structures are unremarkable. IMPRESSION: No active disease. Electronically Signed   By: Darliss Cheney M.D.   On: 05/26/2023 20:44    Procedures Procedures    Medications Ordered in ED Medications  cefTRIAXone (ROCEPHIN) 2 g in sodium chloride 0.9 % 100 mL IVPB (2 g Intravenous New Bag/Given 05/28/23 1503)  atorvastatin (LIPITOR) tablet 10 mg (10 mg Oral Given 05/28/23 0905)  lisinopril (ZESTRIL) tablet 2.5 mg (2.5 mg Oral Given 05/28/23 0905)  FLUoxetine (PROZAC) capsule 30 mg (30 mg Oral Given 05/28/23 0912)  risperiDONE (RISPERDAL) tablet 0.25 mg (0.25 mg Oral Given 05/27/23 2108)  levETIRAcetam (KEPPRA) tablet 500 mg (500 mg Oral Given 05/28/23 0906)  enoxaparin (LOVENOX) injection 40 mg (40 mg Subcutaneous Given 05/28/23 0905)  acetaminophen (TYLENOL) tablet 650 mg (has no administration in time range)    Or  acetaminophen (TYLENOL) suppository 650 mg (has no administration in time range)  oxyCODONE (Oxy IR/ROXICODONE) immediate release tablet 5 mg (has no administration in time  range)  senna-docusate (Senokot-S) tablet 1 tablet (has no administration in time range)  ondansetron (ZOFRAN) tablet 4 mg (has no administration in time range)    Or  ondansetron (ZOFRAN) injection 4 mg (has no administration in time range)  insulin aspart (novoLOG) injection 0-6 Units (2 Units Subcutaneous Given 05/28/23 1403)  insulin aspart (novoLOG) injection 0-5 Units ( Subcutaneous Not Given 05/27/23 2056)  ferrous sulfate tablet 324 mg (324 mg Oral Given 05/28/23 0905)  mupirocin ointment (BACTROBAN) 2 % 1 Application (1 Application Nasal Given 05/28/23 0905)  Chlorhexidine Gluconate Cloth 2 % PADS 6 each (6 each Topical Given 05/28/23 0513)  vancomycin (VANCOREADY) IVPB 1250 mg/250 mL (1,250 mg Intravenous New Bag/Given 05/28/23 1505)  sodium chloride 0.9 % bolus 2,000 mL (0 mLs Intravenous Stopped 05/26/23 2208)    ED Course/ Medical Decision Making/ A&P                             Medical Decision Making Amount and/or Complexity of Data Reviewed Labs: ordered. Radiology: ordered.  Risk Decision regarding hospitalization.  This patient presents to the ED for concern of swelling and redness to left foot, this  involves an extensive number of treatment options, and is a complaint that carries with it a high risk of complications and morbidity.  The differential diagnosis includes cellulitis left foot, contusion left foot   Co morbidities that complicate the patient evaluation  TBI and diabetes   Additional history obtained:  Additional history obtained from EMS External records from outside source obtained and reviewed including hospital records   Lab Tests:  I Ordered, and personally interpreted labs.  The pertinent results include: WBC 11   Imaging Studies ordered:  I ordered imaging studies including left foot x-ray I independently visualized and interpreted imaging which showed no acute disease I agree with the radiologist interpretation   Cardiac Monitoring: /  EKG:  The patient was maintained on a cardiac monitor.  I personally viewed and interpreted the cardiac monitored which showed an underlying rhythm of: Normal sinus rhythm Normal  Consultations Obtained:  I requested consultation with the hospitalist,  and discussed lab and imaging findings as well as pertinent plan - they recommend: Admit for IV antibiotics   Problem List / ED Course / Critical interventions / Medication management  TBI and diabetes and cellulitis of left foot I ordered medication including antibiotics for likely cellulitis Reevaluation of the patient after these medicines showed that the patient stayed the same I have reviewed the patients home medicines and have made adjustments as needed   Social Determinants of Health:  Decreased mental capacity secondary to TBI   Test / Admission - Considered:  None  Patient with cellulitis to left foot.  He will be admitted to medicine        Final Clinical Impression(s) / ED Diagnoses Final diagnoses:  Cellulitis of foot, left    Rx / DC Orders ED Discharge Orders     None         Bethann Berkshire, MD 05/28/23 1652

## 2023-05-28 NOTE — Plan of Care (Signed)
  Problem: Safety: Goal: Ability to remain free from injury will improve Outcome: Not Progressing   Problem: Clinical Measurements: Goal: Will remain free from infection Outcome: Not Progressing

## 2023-05-28 NOTE — Progress Notes (Signed)
PROGRESS NOTE    [name]  JXB:[id number] DOB: Mar 29, 1966 DOA: 05/26/2023 PCP: Marcine Matar, MD   Brief Narrative:  HPI: [name] is a 57 y.o. 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   Patient has been staying with a friend who works 2 jobs and is gone most of the time.  He has noticed that the patient has not been ambulating as usual for the past 2 days and has not been eating.  Patient's friend also notes that he has had difficulty adequately caring for the patient and is hoping to find some help.  Patient himself has no acute complaints.     ED Course: Upon arrival to the ED, patient is found to be afebrile and saturating well on room air with normal heart rate and stable blood pressure.  Chest x-rays and plain radiographs of the left foot are negative for acute findings.  Labs are notable for WBC 11,000, lactic acid 0.9, hemoglobin 9.1, and albumin 2.4.    Blood cultures were collected in the ED and the patient was given 2 L of NS and 2 g IV Rocephin.  Assessment & Plan:   Principal Problem:   Cellulitis of left foot Active Problems:   Essential hypertension   Type 2 diabetes mellitus with peripheral neuropathy (HCC)   Seizure disorder (HCC)   MDD (major depressive disorder), recurrent episode, moderate (HCC)   History of traumatic brain injury   Cutaneous abscess of left foot   PAD (peripheral artery disease) (HCC)   Severe protein-calorie malnutrition (HCC)  1. Left foot cellulitis/dry gangrene/possible osteomyelitis: No acute osseous abnormality on plain radiographs but highly suspicious for osteomyelitis as he does appear to have wet gangrene which is very foul-smelling.  MRI is still pending but  out of very high suspicion, I consulted Dr. Lajoyce Corners and he was seen by him as well and he has posted for transmetatarsal amputation on Sunday.  Patient on Rocephin which I will continue and add vancomycin.  Please see pictures below from today.   2. Hx of TBI; depression; aggression  - Continue Prozac and Risperdal, use delirium precautions    3. Type II DM  - A1c was only 5.2% in May 2024.  Appears to be taking metformin at home which is on hold and continue SSI, blood sugar controlled.   4. Seizure disorder  - Continue Keppra   #5 hyperlipidemia: Continue atorvastatin.  #6 hypertension: Controlled.  Continue lisinopril.       DVT prophylaxis: enoxaparin (LOVENOX) injection 40 mg Start: 05/27/23 1000   Code Status: Full Code  Family Communication:  None present at bedside.  Received a message from the nurse last evening to call his Sister [name] at [phone].  I called and the call was going to voicemail directly and left a voicemail.  Status is: Inpatient Remains inpatient appropriate because: Patient with likely osteomyelitis, pending MRI and posted for surgery on Sunday by Dr. Lajoyce Corners.   Estimated body mass index is 22.02 kg/m as calculated from the following:   Height as of 03/11/23: 5\' 2"  (1.575 m).   Weight as of this encounter: 54.6 kg.    Nutritional Assessment: Body mass index is 22.02 kg/m.Marland Kitchen Seen by dietician.  I agree with the assessment and plan as outlined  below: Nutrition Status:        . Skin Assessment: I have examined the patient's skin and I agree with the wound assessment as performed by the wound care RN as outlined below:    Consultants:  Ortho   Procedures:  As above  Antimicrobials:  Anti-infectives (From admission, onward)    Start     Dose/Rate Route Frequency Ordered Stop   05/26/23 2030  cefTRIAXone (ROCEPHIN) 2 g in sodium chloride 0.9 % 100 mL IVPB        2 g 200 mL/hr over 30 Minutes Intravenous Every 24 hours 05/26/23 2026  06/02/23 1759         Subjective: Patient seen and examined.  He has no complaints.  Objective: Vitals:   05/27/23 1457 05/27/23 2031 05/28/23 0450 05/28/23 0710  BP: 107/62 (!) 105/57 127/75 128/77  Pulse: 81 78 77 73  Resp: 18 18 18 15   Temp: 97.7 F (36.5 C) 98.5 F (36.9 C) 98.5 F (36.9 C) 98.6 F (37 C)  TempSrc: Oral Oral Oral Oral  SpO2: 100% 99% 99% 99%  Weight:        Intake/Output Summary (Last 24 hours) at 05/28/2023 1027 Last data filed at 05/28/2023 0900 Gross per 24 hour  Intake 577 ml  Output 1140 ml  Net -563 ml   Filed Weights   05/26/23 2321  Weight: 54.6 kg    Examination:  General exam: Appears calm and comfortable  Respiratory system: Clear to auscultation. Respiratory effort normal. Cardiovascular system: S1 & S2 heard, RRR. No JVD, murmurs, rubs, gallops or clicks. No pedal edema. Gastrointestinal system: Abdomen is nondistended, soft and nontender. No organomegaly or masses felt. Normal bowel sounds heard. Central nervous system: Alert and oriented. No focal neurological deficits. Extremities: Wet gangrene left foot, pictures above.  Data Reviewed: I have personally reviewed following labs and imaging studies  CBC: Recent Labs  Lab 05/26/23 2023 05/27/23 0659 05/28/23 0208  WBC 11.0* 10.0 9.8  NEUTROABS 9.5*  --   --   HGB 9.1* 8.9* 8.6*  HCT 26.7* 26.0* 25.3*  MCV 86.1 85.5 86.3  PLT 375 379 362   Basic Metabolic Panel: Recent Labs  Lab 05/26/23 2023 05/27/23 0659 05/28/23 0208  NA 136 136 134*  K 3.7 3.5 4.2  CL 100 100 97*  CO2 26 24 31   GLUCOSE 118* 103* 213*  BUN 16 15 25*  CREATININE 0.83 0.81 0.88  CALCIUM 8.5* 8.2* 8.2*   GFR: Estimated Creatinine Clearance: 71.5 mL/min (by C-G formula based on SCr of 0.88 mg/dL). Liver Function Tests: Recent Labs  Lab 05/26/23 2023  AST 14*  ALT 17  ALKPHOS 59  BILITOT 0.4  PROT 6.9  ALBUMIN 2.4*   No results for input(s): "LIPASE", "AMYLASE" in the last 168  hours. No results for input(s): "AMMONIA" in the last 168 hours. Coagulation Profile: Recent Labs  Lab 05/26/23 2023  INR 1.2   Cardiac Enzymes: No results for input(s): "CKTOTAL", "CKMB", "CKMBINDEX", "TROPONINI" in the last 168 hours. BNP (last 3 results) No results for input(s): "PROBNP" in the last 8760 hours. HbA1C: Recent Labs    05/27/23 0659  HGBA1C 5.8*   CBG: Recent Labs  Lab 05/27/23 0802 05/27/23 1115 05/27/23 1609 05/27/23 2033 05/28/23 0708  GLUCAP 85 81 192* 157* 172*   Lipid Profile: No results for input(s): "CHOL", "HDL", "LDLCALC", "TRIG", "CHOLHDL", "LDLDIRECT" in the last 72 hours. Thyroid Function Tests: No results for input(s): "TSH", "T4TOTAL", "FREET4", "T3FREE", "THYROIDAB" in  the last 72 hours. Anemia Panel: No results for input(s): "VITAMINB12", "FOLATE", "FERRITIN", "TIBC", "IRON", "RETICCTPCT" in the last 72 hours. Sepsis Labs: Recent Labs  Lab 05/26/23 2045  LATICACIDVEN 0.9    Recent Results (from the past 240 hour(s))  Blood Culture (routine x 2)     Status: None (Preliminary result)   Collection Time: 05/26/23  8:23 PM   Specimen: BLOOD RIGHT FOREARM  Result Value Ref Range Status   Specimen Description BLOOD RIGHT FOREARM  Final   Special Requests   Final    BOTTLES DRAWN AEROBIC AND ANAEROBIC Blood Culture adequate volume   Culture   Final    NO GROWTH 2 DAYS Performed at [facility], [address] [place], [place] [address]    Report Status PENDING  Incomplete  Resp panel by RT-PCR (RSV, Flu A&B, Covid) Anterior Nasal Swab     Status: None   Collection Time: 05/26/23  8:26 PM   Specimen: Anterior Nasal Swab  Result Value Ref Range Status   SARS Coronavirus 2 by RT PCR NEGATIVE NEGATIVE Final   Influenza A by PCR NEGATIVE NEGATIVE Final   Influenza B by PCR NEGATIVE NEGATIVE Final    Comment: (NOTE) The Xpert Xpress SARS-CoV-2/FLU/RSV plus assay is intended as an aid in the diagnosis of influenza from  Nasopharyngeal swab specimens and should not be used as a sole basis for treatment. Nasal washings and aspirates are unacceptable for Xpert Xpress SARS-CoV-2/FLU/RSV testing.  Fact Sheet for Patients: BloggerCourse.com  Fact Sheet for Healthcare Providers: SeriousBroker.it  This test is not yet approved or cleared by the [place] FDA and has been authorized for detection and/or diagnosis of SARS-CoV-2 by FDA under an Emergency Use Authorization (EUA). This EUA will remain in effect (meaning this test can be used) for the duration of the COVID-19 declaration under Section 564(b)(1) of the Act, 21 U.S.C. section 360bbb-3(b)(1), unless the authorization is terminated or revoked.     Resp Syncytial Virus by PCR NEGATIVE NEGATIVE Final    Comment: (NOTE) Fact Sheet for Patients: BloggerCourse.com  Fact Sheet for Healthcare Providers: SeriousBroker.it  This test is not yet approved or cleared by the [place] FDA and has been authorized for detection and/or diagnosis of SARS-CoV-2 by FDA under an Emergency Use Authorization (EUA). This EUA will remain in effect (meaning this test can be used) for the duration of the COVID-19 declaration under Section 564(b)(1) of the Act, 21 U.S.C. section 360bbb-3(b)(1), unless the authorization is terminated or revoked.  Performed at [facility], [address], [place], [place] [address]   Blood Culture (routine x 2)     Status: None (Preliminary result)   Collection Time: 05/26/23  8:31 PM   Specimen: BLOOD  Result Value Ref Range Status   Specimen Description BLOOD RIGHT ANTECUBITAL  Final   Special Requests   Final    BOTTLES DRAWN AEROBIC AND ANAEROBIC Blood Culture adequate volume   Culture   Final    NO GROWTH 2 DAYS Performed at [facility], [address] [place], [place] [address]    Report Status PENDING   Incomplete  Surgical pcr screen     Status: Abnormal   Collection Time: 05/27/23  9:11 PM   Specimen: Nasal Mucosa; Nasal Swab  Result Value Ref Range Status   MRSA, PCR NEGATIVE NEGATIVE Final   Staphylococcus aureus POSITIVE (A) NEGATIVE Final    Comment: (NOTE) The Xpert SA Assay (FDA approved for NASAL  specimens in patients 52 years of age and older), is one component of a comprehensive surveillance program. It is not intended to diagnose infection nor to guide or monitor treatment. Performed at [facility], [address], [place], [place] [address]      Radiology Studies: VAS [place] ABI WITH/WO TBI  Result Date: 05/27/2023  LOWER EXTREMITY DOPPLER STUDY Patient Name:  [name]  Date of Exam:   05/27/2023 Medical Rec #: [id number]               Accession #:    [id number] Date of Birth: [birth year]              Patient Gender: M Patient Age:   73 years Exam Location:  [facility] Procedure:      VAS [place] ABI WITH/WO TBI Referring Phys: TIMOTHY OPYD --------------------------------------------------------------------------------  Indications: Ulceration, and gangrene. High Risk Factors: Diabetes. Other Factors: Traumatic brain injury.  Limitations: Today's exam was limited due to bandages and an open wound. Comparison Study: No prior study on file Performing Technologist: Sherren Kerns RVS  Examination Guidelines: A complete evaluation includes at minimum, Doppler waveform signals and systolic blood pressure reading at the level of bilateral brachial, anterior tibial, and posterior tibial arteries, when vessel segments are accessible. Bilateral testing is considered an integral part of a complete examination. Photoelectric Plethysmograph (PPG) waveforms and toe systolic pressure readings are included as required and additional duplex testing as needed. Limited examinations for reoccurring indications may be performed as noted.  ABI Findings:  +---------+------------------+-----+-----------+--------+ Right    Rt Pressure (mmHg)IndexWaveform   Comment  +---------+------------------+-----+-----------+--------+ Brachial 147                    triphasic           +---------+------------------+-----+-----------+--------+ PTA      254               1.65 multiphasic         +---------+------------------+-----+-----------+--------+ DP       254               1.65 multiphasic         +---------+------------------+-----+-----------+--------+ Great Toe98                0.64                     +---------+------------------+-----+-----------+--------+ +--------+------------------+-----+-----------+-------+ Left    Lt Pressure (mmHg)IndexWaveform   Comment +--------+------------------+-----+-----------+-------+ [id number]                    triphasic          +--------+------------------+-----+-----------+-------+ PTA     254               1.65 multiphasic        +--------+------------------+-----+-----------+-------+ DP      254               1.65 multiphasic        +--------+------------------+-----+-----------+-------+  Summary: Right: Resting right ankle-brachial index indicates noncompressible right lower extremity arteries. The right toe-brachial index is abnormal. Left: Resting left ankle-brachial index indicates noncompressible left lower extremity arteries. Unable to attain TBI secondary to ulceration/gangrene and bandages. *See table(s) above for measurements and observations.  Electronically signed by Gerarda Fraction on 05/27/2023 at 6:09:57 PM.    Final    DG Foot Complete Left  Result Date: 05/26/2023 CLINICAL DATA:  Pain EXAM: LEFT FOOT -  COMPLETE 3+ VIEW COMPARISON:  None available FINDINGS: Diffuse vascular calcifications. Plantar calcaneal spur. No acute bony abnormality. Specifically, no fracture, subluxation, or dislocation. IMPRESSION: No acute bony abnormality. Electronically Signed   By:  Charlett Nose M.D.   On: 05/26/2023 20:48   DG Chest Port 1 View  Result Date: 05/26/2023 CLINICAL DATA:  Sepsis EXAM: PORTABLE CHEST 1 VIEW COMPARISON:  Chest x-ray 05/10/2018 FINDINGS: The heart size and mediastinal contours are within normal limits. Both lungs are clear. The visualized skeletal structures are unremarkable. IMPRESSION: No active disease. Electronically Signed   By: Darliss Cheney M.D.   On: 05/26/2023 20:44    Scheduled Meds:  atorvastatin  10 mg Oral Daily   Chlorhexidine Gluconate Cloth  6 each Topical Q0600   enoxaparin (LOVENOX) injection  40 mg Subcutaneous Daily   ferrous sulfate  324 mg Oral Q breakfast   FLUoxetine  30 mg Oral Daily   insulin aspart  0-5 Units Subcutaneous QHS   insulin aspart  0-6 Units Subcutaneous TID WC   levETIRAcetam  500 mg Oral BID   lisinopril  2.5 mg Oral Daily   mupirocin ointment  1 Application Nasal BID   risperiDONE  0.25 mg Oral QHS   Continuous Infusions:  cefTRIAXone (ROCEPHIN)  IV Stopped (05/27/23 1746)     LOS: 2 days   Hughie Closs, MD [facility]  05/28/2023, 10:27 AM   *Please note that this is a verbal dictation therefore any spelling or grammatical errors are due to the "Dragon Medical One" system interpretation.  Please page via Amion and do not message via secure chat for urgent patient care matters. Secure chat can be used for non urgent patient care matters.  How to contact the [facility] Attending or Consulting provider 7A - 7P or covering provider during after hours 7P -7A, for this patient?  Check the care team in [facility] and look for a) attending/consulting [facility] provider listed and b) the [facility] team listed. Page or secure chat 7A-7P. Log into [email or web address] and use [facility] universal password to access. If you do not have the password, please contact the hospital operator. Locate the [facility] provider you are looking for under [facility] and page to a number that you can be directly reached. If you still  have difficulty reaching the provider, please page the [facility] (Director on Call) for the Hospitalists listed on amion for assistance.

## 2023-05-29 DIAGNOSIS — I70262 Atherosclerosis of native arteries of extremities with gangrene, left leg: Secondary | ICD-10-CM

## 2023-05-29 LAB — GLUCOSE, CAPILLARY
Glucose-Capillary: 222 mg/dL — ABNORMAL HIGH (ref 70–99)
Glucose-Capillary: 184 mg/dL — ABNORMAL HIGH (ref 70–99)
Glucose-Capillary: 183 mg/dL — ABNORMAL HIGH (ref 70–99)
Glucose-Capillary: 156 mg/dL — ABNORMAL HIGH (ref 70–99)

## 2023-05-29 NOTE — Progress Notes (Addendum)
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  He was found to have L foot gangrene with osteomyelitis.  Dr. Lajoyce Corners was consulted and he is scheduled for TMA on 7/28.  He is on Rocephin and Vancomycin.  Assessment and Plan:  Left foot cellulitis with gangrene/osteomyelitis Extremely foul-smelling wound MRI with osteo of the 2nd toe and plantar muscles suggesting myositis Dr. Lajoyce Corners has posted for transmetatarsal amputation tomorrow Continue Vanc/Rocephin ABIs were unsuccessful, will request vascular input   H/o TBI Struggles with depression and aggression as well as MCI Continue Prozac, hydroxyzine, and Risperdal   DM A1c was 5.2% in May 2024.   Hold Glucophage Cover with moderate-scale SSI    Seizure disorder  Continue Keppra    HLD Continue atorvastatin   HTN Continue lisinopril    Consultants: Orthopedics Vascular surgery  Procedures: TMA planned for 7/28  Antibiotics: Vancomycin 7/26- Ceftriaxone 7/24-  30 Day Unplanned Readmission Risk Score    Flowsheet Row ED to Hosp-Admission (Current) from 05/26/2023 in [facility]  30 Day Unplanned Readmission Risk Score (%) 16.4 Filed at 05/29/2023 0400       This score is the patient's risk of an unplanned readmission within 30 days of being discharged (0 -100%). The score is based on dignosis, age, lab data, medications, orders, and past utilization.   Low:  0-14.9   Medium: 15-21.9   High: 22-29.9   Extreme: 30 and above           Subjective: [employer] interpreter used.  Patient without apparent complaints (other than wanting coffee).  Did not  appear to know that he was having amputation but also was not apparently upset with this information (so maybe not new information).   Objective: Vitals:   05/28/23 1959 05/29/23 0258  BP: 127/88 (!) 149/81  Pulse: 80 76  Resp: 18 18  Temp: 98.3 F (36.8 C) 98.4 F (36.9 C)  SpO2: 98% 98%    Intake/Output Summary (Last 24 hours) at 05/29/2023 0743 Last data filed at 05/29/2023 0700 Gross per 24 hour  Intake 2267.14 ml  Output 2300 ml  Net -32.86 ml   Filed Weights   05/26/23 2321  Weight: 54.6 kg    Exam:  General:  Appears calm and comfortable and is in NAD; room is very malodorous Eyes:  EOMI, normal lids, iris ENT:  grossly normal hearing, lips & tongue, mmm Neck:  no LAD, masses or thyromegaly Cardiovascular:  RRR, no m/r/g. No LE edema.  Respiratory:   CTA bilaterally with no wheezes/rales/rhonchi.  Normal respiratory effort. Abdomen:  soft, NT, ND Skin:  no rash or induration seen on limited exam; wound viewed on pictures only      Musculoskeletal:  grossly normal tone BUE/BLE Psychiatric:  grossly normal mood and affect, speech sparse but appropriate Neurologic:  CN 2-12 grossly intact, moves all extremities in coordinated fashion   Data Reviewed: I have reviewed the patient's lab results since admission.  Pertinent labs for today include:  Na++ 131 Glucose 188 WBC 10.5 Hgb 8.3     Family Communication: None present; I left a message for his sister at the time of  the visit  Disposition: Status is: Inpatient Remains inpatient appropriate because: gangrenous toes, amputation tomorrow  Planned Discharge Destination:  TBD    Time spent: 35 minutes  Author: Jonah Blue, MD 05/29/2023 7:42 AM  For on call review [email or web address].

## 2023-05-29 NOTE — Plan of Care (Signed)
  Problem: Skin Integrity: Goal: Risk for impaired skin integrity will decrease Outcome: Not Progressing   Problem: Tissue Perfusion: Goal: Adequacy of tissue perfusion will improve Outcome: Not Progressing   Problem: Safety: Goal: Ability to remain free from injury will improve Outcome: Not Progressing

## 2023-05-29 NOTE — Hospital Course (Signed)
57yo male with h/o DM, TBI with residual MCI with aggressive behaviors and L hemiparesis, seizure d/o, and depression who presented on 7/24 with ambulatory dysfunction, lethargy, and anorexia x 2 days.  He was found to have L foot gangrene with osteomyelitis.  Dr. Lajoyce Corners was consulted and he is scheduled for TMA on 7/28.  He is on Rocephin and Vancomycin.
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
## 2023-05-30 ENCOUNTER — Encounter (HOSPITAL_COMMUNITY)
Admission: EM | Disposition: A | Discharge status: Nursing Home (Includes ICF) | Payer: Self-pay | Source: Home / Self Care | Attending: Student

## 2023-05-30 LAB — BASIC METABOLIC PANEL WITH GFR
Anion gap: 6 (ref 5–15)
BUN: 15 mg/dL (ref 6–20)
CO2: 29 mmol/L (ref 22–32)
Calcium: 7.9 mg/dL — ABNORMAL LOW (ref 8.9–10.3)
Chloride: 99 mmol/L (ref 98–111)
Creatinine, Ser: 0.78 mg/dL (ref 0.61–1.24)
GFR, Estimated: >60 mL/min (ref >60)
Glucose, Bld: 167 mg/dL — ABNORMAL HIGH (ref 70–99)
Potassium: 4.2 mmol/L (ref 3.5–5.1)
Sodium: 134 mmol/L — ABNORMAL LOW (ref 135–145)

## 2023-05-30 LAB — CBC WITH DIFFERENTIAL/PLATELET
Abs Immature Granulocytes: 0.12 10*3/uL — ABNORMAL HIGH (ref 0.00–0.07)
Basophils Absolute: 0 10*3/uL (ref 0.0–0.1)
Basophils Relative: 0 %
Eosinophils Absolute: 0 10*3/uL (ref 0.0–0.5)
Eosinophils Relative: 1 %
HCT: 23.8 % — ABNORMAL LOW (ref 39.0–52.0)
Hemoglobin: 8.1 g/dL — ABNORMAL LOW (ref 13.0–17.0)
Immature Granulocytes: 1 %
Lymphocytes Relative: 12 %
Lymphs Abs: 1.1 10*3/uL (ref 0.7–4.0)
MCH: 29.5 pg (ref 26.0–34.0)
MCHC: 34 g/dL (ref 30.0–36.0)
MCV: 86.5 fL (ref 80.0–100.0)
Monocytes Absolute: 0.7 10*3/uL (ref 0.1–1.0)
Monocytes Relative: 8 %
Neutro Abs: 6.9 10*3/uL (ref 1.7–7.7)
Neutrophils Relative %: 78 %
Platelets: 341 10*3/uL (ref 150–400)
RBC: 2.75 MIL/uL — ABNORMAL LOW (ref 4.22–5.81)
RDW: 11.2 % — ABNORMAL LOW (ref 11.5–15.5)
WBC: 8.8 10*3/uL (ref 4.0–10.5)
nRBC: 0 % (ref 0.0–0.2)

## 2023-05-30 LAB — GLUCOSE, CAPILLARY
Glucose-Capillary: 150 mg/dL — ABNORMAL HIGH (ref 70–99)
Glucose-Capillary: 158 mg/dL — ABNORMAL HIGH (ref 70–99)
Glucose-Capillary: 173 mg/dL — ABNORMAL HIGH (ref 70–99)
Glucose-Capillary: 144 mg/dL — ABNORMAL HIGH (ref 70–99)

## 2023-05-30 SURGERY — AMPUTATION, FOOT, PARTIAL
Anesthesia: Choice | Site: Foot | Laterality: Left

## 2023-05-30 MED ORDER — CHLORHEXIDINE GLUCONATE 4 % EX SOLN
60.0000 mL | Freq: Once | CUTANEOUS | Status: AC
  Administered 2023-05-30: 4 via TOPICAL
  Filled 2023-05-30: qty 60

## 2023-05-30 MED ORDER — CEFAZOLIN SODIUM-DEXTROSE 2-4 GM/100ML-% IV SOLN
2.0000 g | INTRAVENOUS | Status: AC
  Administered 2023-05-30: 2 g via INTRAVENOUS
  Filled 2023-05-30: qty 100

## 2023-05-30 MED ORDER — POVIDONE-IODINE 10 % EX SWAB
2.0000 | Freq: Once | CUTANEOUS | Status: AC
  Administered 2023-05-30: 2 via TOPICAL

## 2023-05-30 NOTE — Plan of Care (Signed)
  Problem: Fluid Volume: Goal: Ability to maintain a balanced intake and output will improve Outcome: Progressing   Problem: Metabolic: Goal: Ability to maintain appropriate glucose levels will improve Outcome: Progressing   Problem: Skin Integrity: Goal: Risk for impaired skin integrity will decrease Outcome: Progressing   Problem: Activity: Goal: Risk for activity intolerance will decrease Outcome: Progressing   Problem: Pain Managment: Goal: General experience of comfort will improve Outcome: Progressing

## 2023-05-30 NOTE — Plan of Care (Signed)

## 2023-05-30 NOTE — Progress Notes (Signed)
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  He was found to have L foot gangrene with osteomyelitis.  Dr. Lajoyce Corners was consulted and he is scheduled for TMA on 7/28.  He is on Rocephin and Vancomycin.  Assessment and Plan:   Left foot cellulitis with gangrene/osteomyelitis Extremely foul-smelling wound MRI with osteo of the 2nd toe and plantar muscles suggesting myositis Dr. Lajoyce Corners posted for transmetatarsal amputation on 7/28 but surgery was canceled because he ate this AM, now planning for 7/31 Continue Vanc/Rocephin Vascular has consulted and there is no indication for vascular intervention at this time   H/o TBI Struggles with depression and aggression as well as MCI Continue Prozac, hydroxyzine, and Risperdal   DM A1c was 5.2% in May 2024.   Hold Glucophage Cover with moderate-scale SSI    Seizure disorder  Continue Keppra    HLD Continue atorvastatin   HTN Continue lisinopril      Consultants: Orthopedics Vascular surgery   Procedures: TMA planned for 7/28   Antibiotics: Vancomycin 7/26- Ceftriaxone 7/24-     30 Day Unplanned Readmission Risk Score    Flowsheet Row ED to Hosp-Admission (Current) from [facility]/26/2023 in [facility]  30 Day Unplanned Readmission Risk Score (%) 14.75 Filed at 05/30/2023 0401       This score is the patient's risk of an unplanned readmission within 30 days of being discharged (0 -100%). The score is based on dignosis, age, lab data, medications, orders, and past utilization.   Low:  0-14.9   Medium: 15-21.9   High: 22-29.9   Extreme: 30 and above            Subjective: No complaints.  Chatted in limited English/Spanish at bedside today.  Patient was anticipating surgery but this currently appears to have been canceled today.  Seen by vascular overnight, no significant concern for circulatory dysfunction.  His sister doesn't have the space to meet his needs.  She thinks he will need to go to a nursing home.   Objective: Vitals:   05/29/23 1945 05/30/23 0423  BP: 137/86 96/60  Pulse: 81 66  Resp: 17 16  Temp:    SpO2: 100% 99%    Intake/Output Summary (Last 24 hours) at 05/30/2023 0755 Last data filed at 05/29/2023 2105 Gross per 24 hour  Intake --  Output 300 ml  Net -300 ml   Filed Weights   05/26/23 2321  Weight: 54.6 kg    Exam:  General:  Appears calm and comfortable and is in NAD; less malodorous today Eyes:  EOMI, normal lids, iris ENT:  grossly normal hearing, lips & tongue, mmm Neck:  no LAD, masses or thyromegaly Cardiovascular:  RRR, no m/r/g. No LE edema.  Respiratory:   CTA bilaterally with no wheezes/rales/rhonchi.  Normal respiratory effort. Abdomen:  soft, NT, ND Skin:  no rash or induration seen on limited exam; wounds covered Musculoskeletal:  grossly normal tone BUE/BLE Psychiatric:  grossly normal mood and affect, speech sparse but appropriate Neurologic:  CN 2-12 grossly intact, moves all extremities in coordinated fashion  Data Reviewed: I have reviewed the patient's lab results since admission.  Pertinent labs  for today include:  Na++ 134 Glucose 167 WBC 8.8 Hgb 8.1     Family Communication: None present; I spoke with his sister by telephone  Disposition: Status is: Inpatient Remains inpatient appropriate because: needs surgery for gangrene  Planned Discharge Destination: Skilled nursing facility    Time spent: 35 minutes  Author: Jonah Blue, MD 05/30/2023 7:52 AM  For on call review [email or web address].

## 2023-05-31 LAB — GLUCOSE, CAPILLARY
Glucose-Capillary: 265 mg/dL — ABNORMAL HIGH (ref 70–99)
Glucose-Capillary: 212 mg/dL — ABNORMAL HIGH (ref 70–99)
Glucose-Capillary: 121 mg/dL — ABNORMAL HIGH (ref 70–99)
Glucose-Capillary: 165 mg/dL — ABNORMAL HIGH (ref 70–99)

## 2023-05-31 MED ORDER — INSULIN GLARGINE-YFGN 100 UNIT/ML ~~LOC~~ SOLN
10.0000 [IU] | Freq: Every day | SUBCUTANEOUS | Status: DC
  Administered 2023-05-31 – 2023-08-06 (×67): 10 [IU] via SUBCUTANEOUS
  Filled 2023-05-31 (×72): qty 0.1
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
## 2023-05-31 NOTE — Plan of Care (Signed)
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
## 2023-05-31 NOTE — Progress Notes (Addendum)
  Progress Note    05/31/2023 9:29 AM * No surgery date entered *  Subjective:  no pain this morning   Vitals:   05/31/23 0422 05/31/23 0729  BP: 130/64 115/67  Pulse: 79 88  Resp: 18 18  Temp: 97.9 F (36.6 C) 99.5 F (37.5 C)  SpO2: 100% 99%   Physical Exam: Lungs:  non labored Extremities:  L toes dry gangrene; palpable R DP and PT; unable to palpate L pedal pulses Neurologic: A&O  CBC    Component Value Date/Time   WBC 8.8 05/30/2023 0229   RBC 2.75 (L) 05/30/2023 0229   HGB 8.1 (L) 05/30/2023 0229   HGB 12.7 (L) 11/12/2022 1017   HCT 23.8 (L) 05/30/2023 0229   HCT 37.8 11/12/2022 1017   PLT 341 05/30/2023 0229   PLT 242 11/12/2022 1017   MCV 86.5 05/30/2023 0229   MCV 89 11/12/2022 1017   MCH 29.5 05/30/2023 0229   MCHC 34.0 05/30/2023 0229   RDW 11.2 (L) 05/30/2023 0229   RDW 12.2 11/12/2022 1017   LYMPHSABS 1.1 05/30/2023 0229   LYMPHSABS 1.4 04/01/2017 1444   MONOABS 0.7 05/30/2023 0229   EOSABS 0.0 05/30/2023 0229   EOSABS 0.1 04/01/2017 1444   BASOSABS 0.0 05/30/2023 0229   BASOSABS 0.0 04/01/2017 1444    BMET    Component Value Date/Time   NA 134 (L) 05/30/2023 0229   NA 139 04/20/2019 0914   K 4.2 05/30/2023 0229   CL 99 05/30/2023 0229   CO2 29 05/30/2023 0229   GLUCOSE 167 (H) 05/30/2023 0229   BUN 15 05/30/2023 0229   BUN 16 04/20/2019 0914   CREATININE 0.78 05/30/2023 0229   CREATININE 0.71 06/02/2016 1232   CALCIUM 7.9 (L) 05/30/2023 0229   GFRNONAA >60 05/30/2023 0229   GFRAA >60 01/02/2020 2249    INR    Component Value Date/Time   INR 1.2 05/26/2023 2023     Intake/Output Summary (Last 24 hours) at 05/31/2023 0929 Last data filed at 05/31/2023 0512 Gross per 24 hour  Intake 830.26 ml  Output 1000 ml  Net -169.74 ml     Assessment/Plan:  57 y.o. male with dry gangrene left foot  L foot dry gangrene.  No purulence or worsening erythema.  Plan is for TMA with Dr. Lajoyce Corners pending OR availability.  Vascular will continue  to follow.  We would consider angiography based on bleeding/tissue appearance during TMA.   Emilie Rutter, PA-C [facility] [phone] 05/31/2023 9:29 AM  I have interviewed the patient and examined the patient. I agree with the findings by the PA.  Cari Caraway, MD

## 2023-05-31 NOTE — Progress Notes (Signed)
   05/31/23 1022  Spiritual Encounters  Type of Visit Initial  Care provided to: Patient  Referral source Chaplain assessment  Reason for visit Routine spiritual support  OnCall Visit No   Chaplain visited Pt as part of a routine spiritual care visit. Pt was in his room and welcomed Chaplain to visit him. Pt was grateful to have an opportunity to talk. Pt talked about his sister, his family in [place], his daughter and three granddaughters. 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 Chaplain let Pt know he would be available in case he needs to talk more.

## 2023-06-01 DIAGNOSIS — I10 Essential (primary) hypertension: Secondary | ICD-10-CM

## 2023-06-01 LAB — GLUCOSE, CAPILLARY
Glucose-Capillary: 185 mg/dL — ABNORMAL HIGH (ref 70–99)
Glucose-Capillary: 126 mg/dL — ABNORMAL HIGH (ref 70–99)
Glucose-Capillary: 161 mg/dL — ABNORMAL HIGH (ref 70–99)
Glucose-Capillary: 213 mg/dL — ABNORMAL HIGH (ref 70–99)

## 2023-06-01 MED ORDER — METRONIDAZOLE 500 MG/100ML IV SOLN
500.0000 mg | Freq: Two times a day (BID) | INTRAVENOUS | Status: DC
  Administered 2023-06-01 – 2023-06-02 (×2): 500 mg via INTRAVENOUS
  Filled 2023-06-01 (×2): qty 100

## 2023-06-01 NOTE — Progress Notes (Signed)
PROGRESS NOTE  [name] ZOX:[id number] DOB: [birth year]   PCP: Marcine Matar, MD  Patient is from: Home.  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 He was found to have L foot gangrene with osteomyelitis. Dr. Lajoyce Corners was consulted and he is scheduled for TMA on 7/28. He is on Rocephin and Vancomycin. Dr. Edilia Bo consulted, believes he has adequate circulation for surgery and recommended reconsulting if signs of poor circulation during surgery.  Plan for TMA on 7/31.   Subjective: Seen and examined earlier this morning.  No major events overnight of this morning.  No complaints but not a great historian.  He was asking for coffee.  He denies pain.  Objective: Vitals:   05/31/23 2003 06/01/23 0425 06/01/23 0732 06/01/23 1252  BP: 124/73 (!) 143/88 134/73 133/72  Pulse: 82 91 85 81  Resp: 18 18 18 18   Temp: 99 F (37.2 C) 99.5 F (37.5 C) 98.6 F (37 C) 98 F (36.7 C)  TempSrc: Oral Oral    SpO2: 100% 99%  100%  Weight:        Examination:  GENERAL: No apparent distress.  Nontoxic. HEENT: MMM.  Vision and hearing grossly intact.  NECK: Supple.  No apparent JVD.  RESP:  No IWOB.  Fair aeration bilaterally. CVS:  RRR. Heart sounds normal.  ABD/GI/GU: BS+. Abd soft, NTND.  MSK/EXT:  Moves extremities.  Foul-smelling ulceration with purulent drainage in his left foot toes SKIN: As above. NEURO: Awake and alert.  Oriented to self.  Follows commands.  No apparent focal neuro deficit. PSYCH: Calm. Normal affect.   Procedures:  None  Microbiology summarized: COVID-19, influenza and RSV PCR nonreactive Blood cultures NGTD  Assessment and plan: Principal Problem:    Cellulitis of left foot Active Problems:   Essential hypertension   Type 2 diabetes mellitus with peripheral neuropathy (HCC)   Seizure disorder (HCC)   MDD (major depressive disorder), recurrent episode, moderate (HCC)   History of traumatic brain injury   Cutaneous abscess of left foot   PAD (peripheral artery disease) (HCC)   Severe protein-calorie malnutrition (HCC)  Left foot cellulitis with gangrene/osteomyelitis -MRI with osteo of the 2nd toe and plantar muscles suggesting myositis -Plan for left TMA on 7/31. -Continue Vanc/Rocephin -Add IV Flagyl for anaerobic coverage. -Vascular has consulted and there is no indication for vascular intervention at this time -Therapy evaluation after surgery   H/o TBI/cognitive impairment -Continue Prozac, hydroxyzine, and Risperdal   Unsafe living situation: Sister says living in a [place], unsafe living situation. With TBI doesn't appear to have capacity -TOC consulted    Controlled DM-2 with hyperglycemia: A1c 5.8%. Recent Labs  Lab 05/31/23 1130 05/31/23 1612 05/31/23 2018 06/01/23 0731 06/01/23 1148  GLUCAP 212* 121* 165* 185* 126*  -Continue current insulin regimen   Seizure disorder  -Continue Keppra    HLD -Continue atorvastatin   HTN -Continue lisinopril  Body mass index is 22.02 kg/m.          DVT prophylaxis:  enoxaparin (LOVENOX) injection 40 mg Start: 05/27/23 1000  Code Status: Full code Family Communication: None at bedside Level of care: Med-Surg Status is: Inpatient Remains inpatient appropriate because: Left foot infection/osteomyelitis  Final disposition: TBD after surgery Consultants:  Orthopedic surgery  35 minutes with more than 50% spent in reviewing records, counseling patient/family and coordinating care.   Sch Meds:  Scheduled Meds:  atorvastatin  10 mg Oral Daily   Chlorhexidine Gluconate Cloth  6 each Topical Q0600   enoxaparin (LOVENOX) injection  40 mg Subcutaneous Daily    ferrous sulfate  324 mg Oral Q breakfast   FLUoxetine  30 mg Oral Daily   insulin aspart  0-5 Units Subcutaneous QHS   insulin aspart  0-6 Units Subcutaneous TID WC   insulin glargine-yfgn  10 Units Subcutaneous QHS   levETIRAcetam  500 mg Oral BID   lisinopril  2.5 mg Oral Daily   mupirocin ointment  1 Application Nasal BID   risperiDONE  0.25 mg Oral QHS   Continuous Infusions:  cefTRIAXone (ROCEPHIN)  IV 2 g (05/31/23 1706)   vancomycin 1,250 mg (06/01/23 1434)   PRN Meds:.acetaminophen **OR** acetaminophen, ondansetron **OR** ondansetron (ZOFRAN) IV, oxyCODONE, senna-docusate  Antimicrobials: Anti-infectives (From admission, onward)    Start     Dose/Rate Route Frequency Ordered Stop   05/30/23 0800  ceFAZolin (ANCEF) IVPB 2g/100 mL premix        2 g 200 mL/hr over 30 Minutes Intravenous On call to O.R. 05/30/23 0412 05/30/23 1209   05/28/23 1500  vancomycin (VANCOREADY) IVPB 1250 mg/250 mL        1,250 mg 166.7 mL/hr over 90 Minutes Intravenous Every 24 hours 05/28/23 1211     05/28/23 1145  vancomycin (VANCOREADY) IVPB 1250 mg/250 mL  Status:  Discontinued        1,250 mg 166.7 mL/hr over 90 Minutes Intravenous Every 24 hours 05/28/23 1051 05/28/23 1211   05/26/23 2030  cefTRIAXone (ROCEPHIN) 2 g in sodium chloride 0.9 % 100 mL IVPB        2 g 200 mL/hr over 30 Minutes Intravenous Every 24 hours 05/26/23 2026 06/02/23 1759        I have personally reviewed the following labs and images: CBC: Recent Labs  Lab 05/26/23 2023 05/27/23 0659 05/28/23 0208 05/29/23 0408 05/30/23 0229  WBC 11.0* 10.0 9.8 10.5 8.8  NEUTROABS 9.5*  --   --   --  6.9  HGB 9.1* 8.9* 8.6* 8.3* 8.1*  HCT 26.7* 26.0* 25.3* 24.2* 23.8*  MCV 86.1 85.5 86.3 86.7 86.5  PLT 375 379 362 358 341   BMP &GFR Recent Labs  Lab 05/26/23 2023 05/27/23 0659 05/28/23 0208 05/29/23 0408 05/30/23 0229  NA 136 136 134* 131* 134*  K 3.7 3.5 4.2 3.9 4.2  CL 100 100 97* 97* 99  CO2 26 24 31 27 29    GLUCOSE 118* 103* 213* 188* 167*  BUN 16 15 25* 16 15  CREATININE 0.83 0.81 0.88 0.77 0.78  CALCIUM 8.5* 8.2* 8.2* 7.8* 7.9*   Estimated Creatinine Clearance: 78.7 mL/min (by C-G formula based on SCr of 0.78 mg/dL). Liver & Pancreas: Recent Labs  Lab 05/26/23 2023  AST 14*  ALT 17  ALKPHOS 59  BILITOT 0.4  PROT 6.9  ALBUMIN 2.4*   No results for input(s): "LIPASE", "AMYLASE" in the last 168 hours. No results for input(s): "AMMONIA" in the last 168 hours. Diabetic: No results for input(s): "HGBA1C" in the last 72 hours. Recent Labs  Lab 05/31/23 1130 05/31/23 1612 05/31/23 2018 06/01/23 0731 06/01/23 1148  GLUCAP 212* 121* 165* 185* 126*   Cardiac Enzymes: No results for input(s): "CKTOTAL", "CKMB", "CKMBINDEX", "TROPONINI" in the  last 168 hours. No results for input(s): "PROBNP" in the last 8760 hours. Coagulation Profile: Recent Labs  Lab 05/26/23 2023  INR 1.2   Thyroid Function Tests: No results for input(s): "TSH", "T4TOTAL", "FREET4", "T3FREE", "THYROIDAB" in the last 72 hours. Lipid Profile: No results for input(s): "CHOL", "HDL", "LDLCALC", "TRIG", "CHOLHDL", "LDLDIRECT" in the last 72 hours. Anemia Panel: No results for input(s): "VITAMINB12", "FOLATE", "FERRITIN", "TIBC", "IRON", "RETICCTPCT" in the last 72 hours. Urine analysis:    Component Value Date/Time   COLORURINE AMBER (A) 05/26/2023 2026   APPEARANCEUR CLEAR 05/26/2023 2026   APPEARANCEUR Clear 01/23/2021 1634   LABSPEC 1.020 05/26/2023 2026   PHURINE 5.0 05/26/2023 2026   GLUCOSEU NEGATIVE 05/26/2023 2026   GLUCOSEU NEGATIVE 10/01/2020 0853   HGBUR NEGATIVE 05/26/2023 2026   BILIRUBINUR NEGATIVE 05/26/2023 2026   BILIRUBINUR Negative 01/23/2021 1634   KETONESUR 5 (A) 05/26/2023 2026   PROTEINUR 100 (A) 05/26/2023 2026   UROBILINOGEN 0.2 10/01/2020 0853   NITRITE NEGATIVE 05/26/2023 2026   LEUKOCYTESUR NEGATIVE 05/26/2023 2026   Sepsis Labs: Invalid input(s): "PROCALCITONIN",  "LACTICIDVEN"  Microbiology: Recent Results (from the past 240 hour(s))  Blood Culture (routine x 2)     Status: None   Collection Time: 05/26/23  8:23 PM   Specimen: BLOOD RIGHT FOREARM  Result Value Ref Range Status   Specimen Description BLOOD RIGHT FOREARM  Final   Special Requests   Final    BOTTLES DRAWN AEROBIC AND ANAEROBIC Blood Culture adequate volume   Culture   Final    NO GROWTH 5 DAYS Performed at [facility], [address], [place], [place] [address]    Report Status 05/31/2023 FINAL  Final  Resp panel by RT-PCR (RSV, Flu A&B, Covid) Anterior Nasal Swab     Status: None   Collection Time: 05/26/23  8:26 PM   Specimen: Anterior Nasal Swab  Result Value Ref Range Status   SARS Coronavirus 2 by RT PCR NEGATIVE NEGATIVE Final   Influenza A by PCR NEGATIVE NEGATIVE Final   Influenza B by PCR NEGATIVE NEGATIVE Final    Comment: (NOTE) The Xpert Xpress SARS-CoV-2/FLU/RSV plus assay is intended as an aid in the diagnosis of influenza from Nasopharyngeal swab specimens and should not be used as a sole basis for treatment. Nasal washings and aspirates are unacceptable for Xpert Xpress SARS-CoV-2/FLU/RSV testing.  Fact Sheet for Patients: BloggerCourse.com  Fact Sheet for Healthcare Providers: SeriousBroker.it  This test is not yet approved or cleared by the [place] FDA and has been authorized for detection and/or diagnosis of SARS-CoV-2 by FDA under an Emergency Use Authorization (EUA). This EUA will remain in effect (meaning this test can be used) for the duration of the COVID-19 declaration under Section 564(b)(1) of the Act, 21 U.S.C. section 360bbb-3(b)(1), unless the authorization is terminated or revoked.     Resp Syncytial Virus by PCR NEGATIVE NEGATIVE Final    Comment: (NOTE) Fact Sheet for Patients: BloggerCourse.com  Fact Sheet for Healthcare  Providers: SeriousBroker.it  This test is not yet approved or cleared by the [place] FDA and has been authorized for detection and/or diagnosis of SARS-CoV-2 by FDA under an Emergency Use Authorization (EUA). This EUA will remain in effect (meaning this test can be used) for the duration of the COVID-19 declaration under Section 564(b)(1) of the Act, 21 U.S.C. section 360bbb-3(b)(1), unless the authorization is terminated or revoked.  Performed at [facility], [address] [place], [place] [address]  Blood Culture (routine x 2)     Status: None   Collection Time: 05/26/23  8:31 PM   Specimen: BLOOD  Result Value Ref Range Status   Specimen Description BLOOD RIGHT ANTECUBITAL  Final   Special Requests   Final    BOTTLES DRAWN AEROBIC AND ANAEROBIC Blood Culture adequate volume   Culture   Final    NO GROWTH 5 DAYS Performed at [facility], [address] [place], [place] [address]    Report Status 05/31/2023 FINAL  Final  Surgical pcr screen     Status: Abnormal   Collection Time: 05/27/23  9:11 PM   Specimen: Nasal Mucosa; Nasal Swab  Result Value Ref Range Status   MRSA, PCR NEGATIVE NEGATIVE Final   Staphylococcus aureus POSITIVE (A) NEGATIVE Final    Comment: (NOTE) The Xpert SA Assay (FDA approved for NASAL specimens in patients 34 years of age and older), is one component of a comprehensive surveillance program. It is not intended to diagnose infection nor to guide or monitor treatment. Performed at [facility], [address], [place], [place] [address]     Radiology Studies: No results found.    Olivianna Higley T. Pami [facility]  If 7PM-7AM, please contact night-coverage [email or web address] 06/01/2023, 4:11 PM

## 2023-06-01 NOTE — Plan of Care (Signed)
  Problem: Health Behavior/Discharge Planning: Goal: Ability to identify and utilize available resources and services will improve Outcome: Progressing Goal: Ability to manage health-related needs will improve Outcome: Progressing   Problem: Fluid Volume: Goal: Ability to maintain a balanced intake and output will improve Outcome: Progressing   Problem: Coping: Goal: Ability to adjust to condition or change in health will improve Outcome: Progressing

## 2023-06-01 NOTE — Progress Notes (Addendum)
  Progress Note    06/01/2023 7:49 AM * No surgery date entered *  Subjective:  no complaints   Vitals:   06/01/23 0425 06/01/23 0732  BP: (!) 143/88 134/73  Pulse: 91 85  Resp: 18 18  Temp: 99.5 F (37.5 C) 98.6 F (37 C)  SpO2: 99%    Physical Exam: Lungs:  non labored Extremities:  dressing left in place L foot Neurologic: A&O  CBC    Component Value Date/Time   WBC 8.8 05/30/2023 0229   RBC 2.75 (L) 05/30/2023 0229   HGB 8.1 (L) 05/30/2023 0229   HGB 12.7 (L) 11/12/2022 1017   HCT 23.8 (L) 05/30/2023 0229   HCT 37.8 11/12/2022 1017   PLT 341 05/30/2023 0229   PLT 242 11/12/2022 1017   MCV 86.5 05/30/2023 0229   MCV 89 11/12/2022 1017   MCH 29.5 05/30/2023 0229   MCHC 34.0 05/30/2023 0229   RDW 11.2 (L) 05/30/2023 0229   RDW 12.2 11/12/2022 1017   LYMPHSABS 1.1 05/30/2023 0229   LYMPHSABS 1.4 04/01/2017 1444   MONOABS 0.7 05/30/2023 0229   EOSABS 0.0 05/30/2023 0229   EOSABS 0.1 04/01/2017 1444   BASOSABS 0.0 05/30/2023 0229   BASOSABS 0.0 04/01/2017 1444    BMET    Component Value Date/Time   NA 134 (L) 05/30/2023 0229   NA 139 04/20/2019 0914   K 4.2 05/30/2023 0229   CL 99 05/30/2023 0229   CO2 29 05/30/2023 0229   GLUCOSE 167 (H) 05/30/2023 0229   BUN 15 05/30/2023 0229   BUN 16 04/20/2019 0914   CREATININE 0.78 05/30/2023 0229   CREATININE 0.71 06/02/2016 1232   CALCIUM 7.9 (L) 05/30/2023 0229   GFRNONAA >60 05/30/2023 0229   GFRAA >60 01/02/2020 2249    INR    Component Value Date/Time   INR 1.2 05/26/2023 2023     Intake/Output Summary (Last 24 hours) at 06/01/2023 0749 Last data filed at 06/01/2023 0504 Gross per 24 hour  Intake 500 ml  Output 1940 ml  Net -1440 ml     Assessment/Plan:  57 y.o. male with dry gangrene left foot  L foot with dry gangrene.  Plans noted for TMA with Dr. Lajoyce Corners tomorrow.  Vascular will consider angiography if there is poor bleeding/tissue appearance during amputation   Emilie Rutter,  PA-C [facility] [phone] 06/01/2023 7:49 AM  I have interviewed the patient and examined the patient. I agree with the findings by the PA.  Cari Caraway, MD

## 2023-06-01 NOTE — Progress Notes (Signed)
Pharmacy Antibiotic Note  Marcas Babcock is a 57 y.o. male admitted on 05/26/2023 with  likely osteo .  Pharmacy has been consulted for vanc dosing.  Pt was admitted for necrotic toes. Plan to do TMA tomorrow. Currently on ceftriaxone and vancomycin.  Scr 0.78  Plan: Vanc 1.25g IV q24>>AUC 491, scr 0.78 F/u post TMA Levels as needed  Weight: 54.6 kg (120 lb 5.9 oz)  Temp (24hrs), Avg:99 F (37.2 C), Min:98.6 F (37 C), Max:99.5 F (37.5 C)  Recent Labs  Lab 05/26/23 2023 05/26/23 2045 05/27/23 0659 05/28/23 0208 05/29/23 0408 05/30/23 0229  WBC 11.0*  --  10.0 9.8 10.5 8.8  CREATININE 0.83  --  0.81 0.88 0.77 0.78  LATICACIDVEN  --  0.9  --   --   --   --     Estimated Creatinine Clearance: 78.7 mL/min (by C-G formula based on SCr of 0.78 mg/dL).    Allergies  Allergen Reactions   Latex Itching and Rash    Antimicrobials this admission: 7/26 vanc>> 7/24 ceftriaxone>>  Dose adjustments this admission:   Microbiology results: 7/24 blood>>ngtd MRSA+  Gwynn Burly, PharmD  06/01/2023 10:17 AM

## 2023-06-01 NOTE — TOC Progression Note (Signed)
Transition of Care [facility]) - Progression Note    Patient Details  Name: Hancel Gephart MRN: [id number] Date of Birth: [birth year]  [facility] of [facility] CM/SW Contact  Lorri Frederick, LCSW Phone Number: 06/01/2023, 2:49 PM  Clinical Narrative:    CSW spoke with [facility]--there is no open report for this pt.          Expected Discharge Plan and Services                                               Social Determinants of Health (SDOH) Interventions SDOH Screenings   Depression (PHQ2-9): Low Risk  (03/11/2023)  Tobacco Use: Low Risk  (05/26/2023)    Readmission Risk Interventions     No data to display

## 2023-06-01 NOTE — Progress Notes (Signed)
Patient appeared withdrawn this afternoon. RN inquired about patient's family. Patient expressed that he would like to speak to his sister, [name]. RN called patient's sister for him.

## 2023-06-02 ENCOUNTER — Other Ambulatory Visit: Payer: Self-pay

## 2023-06-02 ENCOUNTER — Inpatient Hospital Stay (HOSPITAL_COMMUNITY): Payer: Self-pay

## 2023-06-02 ENCOUNTER — Encounter (HOSPITAL_COMMUNITY)
Admission: EM | Disposition: A | Discharge status: Nursing Home (Includes ICF) | Payer: Self-pay | Source: Home / Self Care | Attending: Student

## 2023-06-02 ENCOUNTER — Encounter (HOSPITAL_COMMUNITY): Payer: Self-pay | Admitting: Family Medicine

## 2023-06-02 DIAGNOSIS — M869 Osteomyelitis, unspecified: Secondary | ICD-10-CM

## 2023-06-02 DIAGNOSIS — I10 Essential (primary) hypertension: Secondary | ICD-10-CM

## 2023-06-02 DIAGNOSIS — E1169 Type 2 diabetes mellitus with other specified complication: Secondary | ICD-10-CM

## 2023-06-02 DIAGNOSIS — E039 Hypothyroidism, unspecified: Secondary | ICD-10-CM

## 2023-06-02 HISTORY — PX: APPLICATION OF WOUND VAC: SHX5189

## 2023-06-02 HISTORY — PX: AMPUTATION: SHX166

## 2023-06-02 LAB — GLUCOSE, CAPILLARY
Glucose-Capillary: 122 mg/dL — ABNORMAL HIGH (ref 70–99)
Glucose-Capillary: 97 mg/dL (ref 70–99)
Glucose-Capillary: 119 mg/dL — ABNORMAL HIGH (ref 70–99)
Glucose-Capillary: 158 mg/dL — ABNORMAL HIGH (ref 70–99)
Glucose-Capillary: 139 mg/dL — ABNORMAL HIGH (ref 70–99)

## 2023-06-02 LAB — MAGNESIUM: Magnesium: 1.8 mg/dL (ref 1.7–2.4)

## 2023-06-02 SURGERY — AMPUTATION, FOOT, PARTIAL
Anesthesia: Monitor Anesthesia Care | Site: Foot | Laterality: Left

## 2023-06-02 MED ORDER — JUVEN PO PACK
1.0000 | PACK | Freq: Two times a day (BID) | ORAL | Status: DC
  Administered 2023-06-02 – 2023-08-31 (×172): 1 via ORAL
  Filled 2023-06-02 (×169): qty 1

## 2023-06-02 MED ORDER — POLYETHYLENE GLYCOL 3350 17 G PO PACK
17.0000 g | PACK | Freq: Every day | ORAL | Status: DC | PRN
  Administered 2023-07-30: 17 g via ORAL
  Filled 2023-06-02: qty 1

## 2023-06-02 MED ORDER — PROMETHAZINE HCL 25 MG/ML IJ SOLN: 6.2500 mg | INTRAMUSCULAR | Status: DC | PRN

## 2023-06-02 MED ORDER — BUPIVACAINE HCL (PF) 0.5 % IJ SOLN
INTRAMUSCULAR | Status: DC | PRN
  Administered 2023-06-02: 40 mL via PERINEURAL

## 2023-06-02 MED ORDER — LIDOCAINE 2% (20 MG/ML) 5 ML SYRINGE
INTRAMUSCULAR | Status: AC
  Filled 2023-06-02: qty 5

## 2023-06-02 MED ORDER — HYDRALAZINE HCL 20 MG/ML IJ SOLN: 5.0000 mg | INTRAMUSCULAR | Status: DC | PRN

## 2023-06-02 MED ORDER — EPHEDRINE SULFATE-NACL 50-0.9 MG/10ML-% IV SOSY
PREFILLED_SYRINGE | INTRAVENOUS | Status: DC | PRN
  Administered 2023-06-02: 5 mg via INTRAVENOUS

## 2023-06-02 MED ORDER — CHLORHEXIDINE GLUCONATE 4 % EX SOLN: 60.0000 mL | Freq: Once | CUTANEOUS | Status: DC

## 2023-06-02 MED ORDER — CHLORHEXIDINE GLUCONATE 0.12 % MT SOLN: 15.0000 mL | Freq: Once | OROMUCOSAL | Status: AC

## 2023-06-02 MED ORDER — GUAIFENESIN-DM 100-10 MG/5ML PO SYRP: 15.0000 mL | ORAL_SOLUTION | ORAL | Status: DC | PRN

## 2023-06-02 MED ORDER — VITAMIN C 500 MG PO TABS
1000.0000 mg | ORAL_TABLET | Freq: Every day | ORAL | Status: DC
  Administered 2023-06-02 – 2023-08-31 (×91): 1000 mg via ORAL
  Filled 2023-06-02 (×92): qty 2

## 2023-06-02 MED ORDER — CEFAZOLIN SODIUM-DEXTROSE 2-4 GM/100ML-% IV SOLN
2.0000 g | Freq: Three times a day (TID) | INTRAVENOUS | Status: AC
  Administered 2023-06-02 – 2023-06-03 (×2): 2 g via INTRAVENOUS
  Filled 2023-06-02 (×2): qty 100

## 2023-06-02 MED ORDER — MEPERIDINE HCL 25 MG/ML IJ SOLN: 6.2500 mg | INTRAMUSCULAR | Status: DC | PRN

## 2023-06-02 MED ORDER — METOPROLOL TARTRATE 5 MG/5ML IV SOLN: 2.0000 mg | INTRAVENOUS | Status: DC | PRN

## 2023-06-02 MED ORDER — POTASSIUM CHLORIDE CRYS ER 20 MEQ PO TBCR: 20.0000 meq | EXTENDED_RELEASE_TABLET | Freq: Every day | ORAL | Status: DC | PRN

## 2023-06-02 MED ORDER — OXYCODONE HCL 5 MG/5ML PO SOLN: 5.0000 mg | Freq: Once | ORAL | Status: DC | PRN

## 2023-06-02 MED ORDER — MAGNESIUM SULFATE 2 GM/50ML IV SOLN: 2.0000 g | Freq: Every day | INTRAVENOUS | Status: DC | PRN

## 2023-06-02 MED ORDER — OXYCODONE HCL 5 MG PO TABS: 5.0000 mg | ORAL_TABLET | Freq: Once | ORAL | Status: DC | PRN

## 2023-06-02 MED ORDER — ACETAMINOPHEN 325 MG PO TABS: 325.0000 mg | ORAL_TABLET | Freq: Four times a day (QID) | ORAL | Status: DC | PRN

## 2023-06-02 MED ORDER — ONDANSETRON HCL 4 MG/2ML IJ SOLN: 4.0000 mg | Freq: Four times a day (QID) | INTRAMUSCULAR | Status: DC | PRN

## 2023-06-02 MED ORDER — POVIDONE-IODINE 10 % EX SWAB
2.0000 | Freq: Once | CUTANEOUS | Status: AC
  Administered 2023-06-02: 2 via TOPICAL

## 2023-06-02 MED ORDER — MIDAZOLAM HCL 2 MG/2ML IJ SOLN
INTRAMUSCULAR | Status: DC | PRN
  Administered 2023-06-02: 2 mg via INTRAVENOUS

## 2023-06-02 MED ORDER — CEFAZOLIN SODIUM-DEXTROSE 2-4 GM/100ML-% IV SOLN
INTRAVENOUS | Status: AC
  Filled 2023-06-02: qty 100

## 2023-06-02 MED ORDER — HYDROMORPHONE HCL 1 MG/ML IJ SOLN: 0.2500 mg | INTRAMUSCULAR | Status: DC | PRN

## 2023-06-02 MED ORDER — ONDANSETRON HCL 4 MG/2ML IJ SOLN
INTRAMUSCULAR | Status: AC
  Filled 2023-06-02: qty 2

## 2023-06-02 MED ORDER — FENTANYL CITRATE (PF) 100 MCG/2ML IJ SOLN
INTRAMUSCULAR | Status: AC
  Filled 2023-06-02: qty 2

## 2023-06-02 MED ORDER — ONDANSETRON HCL 4 MG/2ML IJ SOLN
INTRAMUSCULAR | Status: DC | PRN
Start: 2023-06-02 — End: 2023-06-02
  Administered 2023-06-02: 4 mg via INTRAVENOUS

## 2023-06-02 MED ORDER — PANTOPRAZOLE SODIUM 40 MG PO TBEC
40.0000 mg | DELAYED_RELEASE_TABLET | Freq: Every day | ORAL | Status: DC
  Administered 2023-06-03 – 2023-09-09 (×99): 40 mg via ORAL
  Filled 2023-06-02 (×99): qty 1

## 2023-06-02 MED ORDER — ZINC SULFATE 220 (50 ZN) MG PO CAPS
220.0000 mg | ORAL_CAPSULE | Freq: Every day | ORAL | Status: AC
  Administered 2023-06-02 – 2023-06-15 (×14): 220 mg via ORAL
  Filled 2023-06-02 (×14): qty 1

## 2023-06-02 MED ORDER — FENTANYL CITRATE (PF) 250 MCG/5ML IJ SOLN
INTRAMUSCULAR | Status: DC | PRN
  Administered 2023-06-02: 25 ug via INTRAVENOUS
  Administered 2023-06-02: 50 ug via INTRAVENOUS

## 2023-06-02 MED ORDER — CEFAZOLIN SODIUM-DEXTROSE 2-4 GM/100ML-% IV SOLN
2.0000 g | INTRAVENOUS | Status: AC
  Administered 2023-06-02: 2 g via INTRAVENOUS

## 2023-06-02 MED ORDER — PHENYLEPHRINE 80 MCG/ML (10ML) SYRINGE FOR IV PUSH (FOR BLOOD PRESSURE SUPPORT)
PREFILLED_SYRINGE | INTRAVENOUS | Status: DC | PRN
  Administered 2023-06-02 (×2): 80 ug via INTRAVENOUS

## 2023-06-02 MED ORDER — MAGNESIUM CITRATE PO SOLN: 1.0000 | Freq: Once | ORAL | Status: DC | PRN

## 2023-06-02 MED ORDER — MIDAZOLAM HCL 2 MG/2ML IJ SOLN: 0.5000 mg | Freq: Once | INTRAMUSCULAR | Status: DC | PRN

## 2023-06-02 MED ORDER — CHLORHEXIDINE GLUCONATE 0.12 % MT SOLN
OROMUCOSAL | Status: AC
  Administered 2023-06-02: 15 mL via OROMUCOSAL
  Filled 2023-06-02: qty 15

## 2023-06-02 MED ORDER — BISACODYL 5 MG PO TBEC: 5.0000 mg | DELAYED_RELEASE_TABLET | Freq: Every day | ORAL | Status: DC | PRN

## 2023-06-02 MED ORDER — ORAL CARE MOUTH RINSE: 15.0000 mL | Freq: Once | OROMUCOSAL | Status: AC

## 2023-06-02 MED ORDER — HYDROMORPHONE HCL 1 MG/ML IJ SOLN: 0.5000 mg | INTRAMUSCULAR | Status: DC | PRN

## 2023-06-02 MED ORDER — VASHE WOUND IRRIGATION OPTIME
TOPICAL | Status: DC | PRN
  Administered 2023-06-02: 34 [oz_av]

## 2023-06-02 MED ORDER — LABETALOL HCL 5 MG/ML IV SOLN: 10.0000 mg | INTRAVENOUS | Status: DC | PRN

## 2023-06-02 MED ORDER — ALUM & MAG HYDROXIDE-SIMETH 200-200-20 MG/5ML PO SUSP: 15.0000 mL | ORAL | Status: DC | PRN

## 2023-06-02 MED ORDER — OXYCODONE HCL 5 MG PO TABS
10.0000 mg | ORAL_TABLET | ORAL | Status: DC | PRN
  Administered 2023-06-08 (×2): 10 mg via ORAL
  Filled 2023-06-02: qty 2

## 2023-06-02 MED ORDER — SODIUM CHLORIDE 0.9 % IV SOLN: INTRAVENOUS | Status: DC

## 2023-06-02 MED ORDER — PHENOL 1.4 % MT LIQD: 1.0000 | OROMUCOSAL | Status: DC | PRN

## 2023-06-02 MED ORDER — 0.9 % SODIUM CHLORIDE (POUR BTL) OPTIME
TOPICAL | Status: DC | PRN
  Administered 2023-06-02: 1000 mL

## 2023-06-02 MED ORDER — DOCUSATE SODIUM 100 MG PO CAPS
100.0000 mg | ORAL_CAPSULE | Freq: Every day | ORAL | Status: DC
  Administered 2023-06-03 – 2023-06-20 (×14): 100 mg via ORAL
  Filled 2023-06-02 (×14): qty 1

## 2023-06-02 MED ORDER — LACTATED RINGERS IV SOLN: INTRAVENOUS | Status: DC

## 2023-06-02 MED ORDER — OXYCODONE HCL 5 MG PO TABS
5.0000 mg | ORAL_TABLET | ORAL | Status: DC | PRN
  Administered 2023-06-02 – 2023-06-08 (×3): 10 mg via ORAL
  Filled 2023-06-02 (×4): qty 2

## 2023-06-02 MED ORDER — PROPOFOL 500 MG/50ML IV EMUL
INTRAVENOUS | Status: DC | PRN
  Administered 2023-06-02: 20 mg via INTRAVENOUS
  Administered 2023-06-02: 50 ug/kg/min via INTRAVENOUS

## 2023-06-02 MED ORDER — MIDAZOLAM HCL 2 MG/2ML IJ SOLN
INTRAMUSCULAR | Status: AC
  Filled 2023-06-02: qty 2

## 2023-06-02 SURGICAL SUPPLY — 40 items
APL SKNCLS STERI-STRIP NONHPOA (GAUZE/BANDAGES/DRESSINGS)
BAG COUNTER SPONGE SURGICOUNT (BAG) ×2 IMPLANT
BAG SPNG CNTER NS LX DISP (BAG) ×2
BENZOIN TINCTURE PRP APPL 2/3 (GAUZE/BANDAGES/DRESSINGS) ×2 IMPLANT
BLADE SAW SGTL HD 18.5X60.5X1. (BLADE) ×2 IMPLANT
BLADE SURG 21 STRL SS (BLADE) ×2 IMPLANT
BNDG COHESIVE 4X5 TAN STRL (GAUZE/BANDAGES/DRESSINGS) IMPLANT
BNDG GAUZE DERMACEA FLUFF 4 (GAUZE/BANDAGES/DRESSINGS) IMPLANT
BNDG GZE DERMACEA 4 6PLY (GAUZE/BANDAGES/DRESSINGS)
CANISTER WOUNDNEG PRESSURE 500 (CANNISTER) IMPLANT
CLEANSER WND VASHE INSTL 34OZ (WOUND CARE) IMPLANT
COVER SURGICAL LIGHT HANDLE (MISCELLANEOUS) ×2 IMPLANT
DRAPE DERMATAC (DRAPES) IMPLANT
DRAPE INCISE IOBAN 66X45 STRL (DRAPES) ×2 IMPLANT
DRAPE U-SHAPE 47X51 STRL (DRAPES) ×2 IMPLANT
DRESSING PEEL AND PLC PRVNA 13 (GAUZE/BANDAGES/DRESSINGS) IMPLANT
DRSG ADAPTIC 3X8 NADH LF (GAUZE/BANDAGES/DRESSINGS) IMPLANT
DRSG PEEL AND PLACE PREVENA 13 (GAUZE/BANDAGES/DRESSINGS) ×2
DURAPREP 26ML APPLICATOR (WOUND CARE) ×2 IMPLANT
ELECT REM PT RETURN 9FT ADLT (ELECTROSURGICAL) ×2
ELECTRODE REM PT RTRN 9FT ADLT (ELECTROSURGICAL) ×2 IMPLANT
GAUZE PAD ABD 8X10 STRL (GAUZE/BANDAGES/DRESSINGS) IMPLANT
GAUZE SPONGE 4X4 12PLY STRL (GAUZE/BANDAGES/DRESSINGS) IMPLANT
GLOVE BIOGEL PI IND STRL 9 (GLOVE) ×2 IMPLANT
GLOVE SURG ORTHO 9.0 STRL STRW (GLOVE) ×2 IMPLANT
GOWN STRL REUS W/ TWL XL LVL3 (GOWN DISPOSABLE) ×6 IMPLANT
GOWN STRL REUS W/TWL XL LVL3 (GOWN DISPOSABLE) ×6
GRAFT SKIN WND MICRO 38 (Tissue) IMPLANT
KIT BASIN OR (CUSTOM PROCEDURE TRAY) ×2 IMPLANT
KIT TURNOVER KIT B (KITS) ×2 IMPLANT
NS IRRIG 1000ML POUR BTL (IV SOLUTION) ×2 IMPLANT
PACK ORTHO EXTREMITY (CUSTOM PROCEDURE TRAY) ×2 IMPLANT
PAD ARMBOARD 7.5X6 YLW CONV (MISCELLANEOUS) ×4 IMPLANT
SPONGE T-LAP 18X18 ~~LOC~~+RFID (SPONGE) IMPLANT
SUT ETHILON 2 0 PSLX (SUTURE) ×4 IMPLANT
TOWEL GREEN STERILE (TOWEL DISPOSABLE) ×2 IMPLANT
TOWEL GREEN STERILE FF (TOWEL DISPOSABLE) ×2 IMPLANT
TUBE CONNECTING 12X1/4 (SUCTIONS) ×2 IMPLANT
WATER STERILE IRR 1000ML POUR (IV SOLUTION) ×2 IMPLANT
YANKAUER SUCT BULB TIP NO VENT (SUCTIONS) ×2 IMPLANT

## 2023-06-02 NOTE — Anesthesia Procedure Notes (Signed)
Anesthesia Regional Block: Ankle block   Pre-Anesthetic Checklist: , timeout performed,  Correct Patient, Correct Site, Correct Laterality,  Correct Procedure,, site marked,  Risks and benefits discussed,  Surgical consent,  Pre-op evaluation,  At surgeon's request and post-op pain management  Laterality: Left and Lower  Prep: chloraprep       Needles:  Injection technique: Single-shot  Needle Type: Quincke      Needle Gauge: 25     Additional Needles:   Narrative:  Start time: 06/02/2023 12:40 PM End time: 06/02/2023 12:46 PM Injection made incrementally with aspirations every 5 mL.  Performed by: Personally   Additional Notes: Patient identified in Holding Room. Routine monitors on.  IV access established.  Timeout, Sterile prep and drape L ankle.  Perineural infiltration of local anesthetic incrementally with #25ga needle around deep and superficial peroneal, saphenous, posterior tibial, and sural nerves.  No heme aspirated. Patient asymptomatic. VSS.  Patient tolerated well.  Total 40cc 0.5% Bupivacaine used.  Sandford Craze, MD

## 2023-06-02 NOTE — Anesthesia Postprocedure Evaluation (Signed)
Anesthesia Post Note  Patient: [name]  Procedure(s) Performed: LEFT TRANSMETATARSAL AMPUTATION (Left: Foot) APPLICATION OF WOUND VAC     Patient location during evaluation: PACU Anesthesia Type: Regional and MAC Level of consciousness: awake and alert, patient cooperative and oriented Pain management: pain level controlled Vital Signs Assessment: post-procedure vital signs reviewed and stable Respiratory status: spontaneous breathing, nonlabored ventilation and respiratory function stable Cardiovascular status: blood pressure returned to baseline and stable Postop Assessment: no apparent nausea or vomiting Anesthetic complications: no   There were no known notable events for this encounter.  Last Vitals:  Vitals:   06/02/23 1500 06/02/23 1519  BP: 92/75 106/69  Pulse: 71 76  Resp: 12 18  Temp: 36.4 C 36.6 C  SpO2: 96% 96%    Last Pain:  Vitals:   06/02/23 1519  TempSrc: Oral  PainSc:                  Tilford Deaton,E. Semaya Vida

## 2023-06-02 NOTE — Anesthesia Preprocedure Evaluation (Addendum)
Anesthesia Evaluation  Patient identified by MRN, date of birth, ID band Patient awake    Reviewed: Allergy & Precautions, NPO status , Patient's Chart, lab work & pertinent test results  History of Anesthesia Complications Negative for: history of anesthetic complications  Airway Mallampati: II  TM Distance: >3 FB Neck ROM: Full    Dental  (+) Poor Dentition, Chipped, Missing, Dental Advisory Given   Pulmonary neg pulmonary ROS   breath sounds clear to auscultation       Cardiovascular hypertension, Pt. on medications (-) angina + Peripheral Vascular Disease   Rhythm:Regular Rate:Normal     Neuro/Psych Seizures -, Well Controlled,   Anxiety Depression    S/p TBI    GI/Hepatic negative GI ROS, Neg liver ROS,,,  Endo/Other  diabetes (glu 97), Oral Hypoglycemic AgentsHypothyroidism    Renal/GU Renal InsufficiencyRenal disease     Musculoskeletal   Abdominal   Peds  Hematology  (+) Blood dyscrasia (Hb 8.0, plt 360k), anemia   Anesthesia Other Findings   Reproductive/Obstetrics                             Anesthesia Physical Anesthesia Plan  ASA: 3  Anesthesia Plan: MAC and Regional   Post-op Pain Management: Tylenol PO (pre-op)*   Induction:   PONV Risk Score and Plan: 1 and Ondansetron and Treatment may vary due to age or medical condition  Airway Management Planned: Natural Airway and Simple Face Mask  Additional Equipment: None  Intra-op Plan:   Post-operative Plan:   Informed Consent: I have reviewed the patients History and Physical, chart, labs and discussed the procedure including the risks, benefits and alternatives for the proposed anesthesia with the patient or authorized representative who has indicated his/her understanding and acceptance.     Dental advisory given, Consent reviewed with POA and Interpreter used for interveiw  Plan Discussed with: CRNA and  Surgeon  Anesthesia Plan Comments: (Plan routine monitors, ankle block with MAC)        Anesthesia Quick Evaluation

## 2023-06-02 NOTE — Progress Notes (Signed)
PROGRESS NOTE  [name] ZOX:[id number] DOB: July 19, 1966   PCP: Marcine Matar, MD  Patient is from: Home.  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 He was found to have L foot gangrene with osteomyelitis. Dr. Lajoyce Corners was consulted and he is scheduled for TMA on 7/28. He is on Rocephin and Vancomycin. Dr. Edilia Bo consulted, believes he has adequate circulation for surgery and recommended reconsulting if signs of poor circulation during surgery.  Underwent left TMA by Dr. Lajoyce Corners on 7/31.   Subjective: Seen and examined earlier this morning before he went for surgery.  No major events overnight of this morning.  No complaints but not a great historian.  Does not appear to be in distress.  He responds no to pain.  Objective: Vitals:   06/02/23 0707 06/02/23 1225 06/02/23 1404 06/02/23 1415  BP: 106/68 102/67 95/60 100/62  Pulse: 83 68 77 75  Resp:  18 10 19   Temp: 97.8 F (36.6 C) 98.7 F (37.1 C) 97.6 F (36.4 C)   TempSrc: Oral Oral    SpO2: 100% 95% 96% 96%  Weight:        Examination:  GENERAL: No apparent distress.  Nontoxic. HEENT: MMM.  Vision and hearing grossly intact.  NECK: Supple.  No apparent JVD.  RESP:  No IWOB.  Fair aeration bilaterally. CVS:  RRR. Heart sounds normal.  ABD/GI/GU: BS+. Abd soft, NTND.  MSK/EXT:  Moves extremities.  Foul-smelling ulceration with purulent drainage in his left foot toes SKIN: As above. NEURO: Awake and alert.  Oriented to self.  Follows commands.  No apparent focal neuro deficit. PSYCH: Calm. Normal affect.   Procedures:  7/31-left TMA by Dr. Lajoyce Corners  Microbiology summarized: COVID-19, influenza and RSV PCR nonreactive Blood  cultures NGTD  Assessment and plan: Principal Problem:   Cellulitis of left foot Active Problems:   Essential hypertension   Type 2 diabetes mellitus with peripheral neuropathy (HCC)   Seizure disorder (HCC)   MDD (major depressive disorder), recurrent episode, moderate (HCC)   History of traumatic brain injury   Cutaneous abscess of left foot   PAD (peripheral artery disease) (HCC)   Severe protein-calorie malnutrition (HCC)  Left foot cellulitis with gangrene/osteomyelitis -MRI with osteo of the 2nd toe and plantar muscles suggesting myositis -Vascular has consulted and there is no indication for vascular intervention at this time -S/p left TMA on 7/31. -Ortho recommends continuing antibiotics for 24 hours postoperatively. -TDWB on the left -Pain control and VTE prophylaxis per surgery -PT/OT eval   H/o TBI/cognitive impairment -Continue Prozac, hydroxyzine, and Risperdal   Unsafe living situation: Sister says living in a [place], unsafe living situation. With TBI doesn't appear to have capacity -TOC consulted    Controlled DM-2 with hyperglycemia: A1c 5.8%. Recent Labs  Lab 06/01/23 1618 06/01/23 2120 06/02/23 0709 06/02/23 1123 06/02/23 1407  GLUCAP 161* 213* 122* 97 119*  -Continue current insulin regimen   Seizure disorder  -Continue Keppra    HLD -Continue atorvastatin   HTN -Continue lisinopril  Body mass index is 22.02 kg/m.          DVT prophylaxis:  enoxaparin (LOVENOX) injection 40 mg Start: 05/27/23 1000  Code Status: Full code Family  Communication: None at bedside Level of care: Med-Surg Status is: Inpatient Remains inpatient appropriate because: Left foot infection/osteomyelitis   Final disposition: TBD after surgery Consultants:  Orthopedic surgery  35 minutes with more than 50% spent in reviewing records, counseling patient/family and coordinating care.   Sch Meds:  Scheduled Meds:  [MAR Hold] atorvastatin  10 mg Oral Daily    chlorhexidine  60 mL Topical Once   [MAR Hold] enoxaparin (LOVENOX) injection  40 mg Subcutaneous Daily   [MAR Hold] ferrous sulfate  324 mg Oral Q breakfast   [MAR Hold] FLUoxetine  30 mg Oral Daily   [MAR Hold] insulin aspart  0-5 Units Subcutaneous QHS   [MAR Hold] insulin aspart  0-6 Units Subcutaneous TID WC   [MAR Hold] insulin glargine-yfgn  10 Units Subcutaneous QHS   [MAR Hold] levETIRAcetam  500 mg Oral BID   [MAR Hold] lisinopril  2.5 mg Oral Daily   [MAR Hold] risperiDONE  0.25 mg Oral QHS   Continuous Infusions:  lactated ringers 400 mL/hr at 06/02/23 1357   [MAR Hold] metronidazole 500 mg (06/02/23 0525)   [MAR Hold] vancomycin 1,250 mg (06/01/23 1434)   PRN Meds:.[MAR Hold] acetaminophen **OR** [MAR Hold] acetaminophen, HYDROmorphone (DILAUDID) injection, meperidine (DEMEROL) injection, midazolam, [MAR Hold] ondansetron **OR** [MAR Hold] ondansetron (ZOFRAN) IV, [MAR Hold] oxyCODONE, oxyCODONE **OR** oxyCODONE, promethazine, [MAR Hold] senna-docusate  Antimicrobials: Anti-infectives (From admission, onward)    Start     Dose/Rate Route Frequency Ordered Stop   06/03/23 0600  ceFAZolin (ANCEF) IVPB 2g/100 mL premix        2 g 200 mL/hr over 30 Minutes Intravenous On call to O.R. 06/02/23 1149 06/02/23 1330   06/02/23 1142  ceFAZolin (ANCEF) 2-4 GM/100ML-% IVPB       Note to Pharmacy: [facility] [name]: cabinet override      06/02/23 1142 06/02/23 1330   06/01/23 1715  [MAR Hold]  metroNIDAZOLE (FLAGYL) IVPB 500 mg        (MAR Hold since Wed 06/02/2023 at 1205.Hold Reason: Transfer to a Procedural area)   500 mg 100 mL/hr over 60 Minutes Intravenous Every 12 hours 06/01/23 1615     05/30/23 0800  ceFAZolin (ANCEF) IVPB 2g/100 mL premix        2 g 200 mL/hr over 30 Minutes Intravenous On call to O.R. 05/30/23 0412 05/30/23 1209   05/28/23 1500  [MAR Hold]  vancomycin (VANCOREADY) IVPB 1250 mg/250 mL        (MAR Hold since Wed 06/02/2023 at 1205.Hold Reason: Transfer to a  Procedural area)   1,250 mg 166.7 mL/hr over 90 Minutes Intravenous Every 24 hours 05/28/23 1211     05/28/23 1145  vancomycin (VANCOREADY) IVPB 1250 mg/250 mL  Status:  Discontinued        1,250 mg 166.7 mL/hr over 90 Minutes Intravenous Every 24 hours 05/28/23 1051 05/28/23 1211   05/26/23 2030  cefTRIAXone (ROCEPHIN) 2 g in sodium chloride 0.9 % 100 mL IVPB        2 g 200 mL/hr over 30 Minutes Intravenous Every 24 hours 05/26/23 2026 06/01/23 1845        I have personally reviewed the following labs and images: CBC: Recent Labs  Lab 05/26/23 2023 05/27/23 0659 05/28/23 0208 05/29/23 0408 05/30/23 0229 06/02/23 0112  WBC 11.0* 10.0 9.8 10.5 8.8 9.3  NEUTROABS 9.5*  --   --   --  6.9 7.4  HGB 9.1* 8.9* 8.6* 8.3* 8.1* 8.0*  HCT 26.7* 26.0* 25.3* 24.2*  23.8* 23.9*  MCV 86.1 85.5 86.3 86.7 86.5 85.1  PLT 375 379 362 358 341 360   BMP &GFR Recent Labs  Lab 05/27/23 0659 05/28/23 0208 05/29/23 0408 05/30/23 0229 06/02/23 0112  NA 136 134* 131* 134* 133*  K 3.5 4.2 3.9 4.2 4.5  CL 100 97* 97* 99 98  CO2 24 31 27 29 28   GLUCOSE 103* 213* 188* 167* 117*  BUN 15 25* 16 15 17   CREATININE 0.81 0.88 0.77 0.78 0.77  CALCIUM 8.2* 8.2* 7.8* 7.9* 8.3*  MG  --   --   --   --  1.8  PHOS  --   --   --   --  3.1   Estimated Creatinine Clearance: 78.7 mL/min (by C-G formula based on SCr of 0.77 mg/dL). Liver & Pancreas: Recent Labs  Lab 05/26/23 2023 06/02/23 0112  AST 14*  --   ALT 17  --   ALKPHOS 59  --   BILITOT 0.4  --   PROT 6.9  --   ALBUMIN 2.4* 2.0*   No results for input(s): "LIPASE", "AMYLASE" in the last 168 hours. No results for input(s): "AMMONIA" in the last 168 hours. Diabetic: No results for input(s): "HGBA1C" in the last 72 hours. Recent Labs  Lab 06/01/23 1618 06/01/23 2120 06/02/23 0709 06/02/23 1123 06/02/23 1407  GLUCAP 161* 213* 122* 97 119*   Cardiac Enzymes: No results for input(s): "CKTOTAL", "CKMB", "CKMBINDEX", "TROPONINI" in the  last 168 hours. No results for input(s): "PROBNP" in the last 8760 hours. Coagulation Profile: Recent Labs  Lab 05/26/23 2023  INR 1.2   Thyroid Function Tests: No results for input(s): "TSH", "T4TOTAL", "FREET4", "T3FREE", "THYROIDAB" in the last 72 hours. Lipid Profile: No results for input(s): "CHOL", "HDL", "LDLCALC", "TRIG", "CHOLHDL", "LDLDIRECT" in the last 72 hours. Anemia Panel: No results for input(s): "VITAMINB12", "FOLATE", "FERRITIN", "TIBC", "IRON", "RETICCTPCT" in the last 72 hours. Urine analysis:    Component Value Date/Time   COLORURINE AMBER (A) 05/26/2023 2026   APPEARANCEUR CLEAR 05/26/2023 2026   APPEARANCEUR Clear 01/23/2021 1634   LABSPEC 1.020 05/26/2023 2026   PHURINE 5.0 05/26/2023 2026   GLUCOSEU NEGATIVE 05/26/2023 2026   GLUCOSEU NEGATIVE 10/01/2020 0853   HGBUR NEGATIVE 05/26/2023 2026   BILIRUBINUR NEGATIVE 05/26/2023 2026   BILIRUBINUR Negative 01/23/2021 1634   KETONESUR 5 (A) 05/26/2023 2026   PROTEINUR 100 (A) 05/26/2023 2026   UROBILINOGEN 0.2 10/01/2020 0853   NITRITE NEGATIVE 05/26/2023 2026   LEUKOCYTESUR NEGATIVE 05/26/2023 2026   Sepsis Labs: Invalid input(s): "PROCALCITONIN", "LACTICIDVEN"  Microbiology: Recent Results (from the past 240 hour(s))  Blood Culture (routine x 2)     Status: None   Collection Time: 05/26/23  8:23 PM   Specimen: BLOOD RIGHT FOREARM  Result Value Ref Range Status   Specimen Description BLOOD RIGHT FOREARM  Final   Special Requests   Final    BOTTLES DRAWN AEROBIC AND ANAEROBIC Blood Culture adequate volume   Culture   Final    NO GROWTH 5 DAYS Performed at [facility], [address], [place], [place] [address]    Report Status 05/31/2023 FINAL  Final  Resp panel by RT-PCR (RSV, Flu A&B, Covid) Anterior Nasal Swab     Status: None   Collection Time: 05/26/23  8:26 PM   Specimen: Anterior Nasal Swab  Result Value Ref Range Status   SARS Coronavirus 2 by RT PCR NEGATIVE NEGATIVE Final    Influenza A by PCR NEGATIVE  NEGATIVE Final   Influenza B by PCR NEGATIVE NEGATIVE Final    Comment: (NOTE) The Xpert Xpress SARS-CoV-2/FLU/RSV plus assay is intended as an aid in the diagnosis of influenza from Nasopharyngeal swab specimens and should not be used as a sole basis for treatment. Nasal washings and aspirates are unacceptable for Xpert Xpress SARS-CoV-2/FLU/RSV testing.  Fact Sheet for Patients: BloggerCourse.com  Fact Sheet for Healthcare Providers: SeriousBroker.it  This test is not yet approved or cleared by the [place] FDA and has been authorized for detection and/or diagnosis of SARS-CoV-2 by FDA under an Emergency Use Authorization (EUA). This EUA will remain in effect (meaning this test can be used) for the duration of the COVID-19 declaration under Section 564(b)(1) of the Act, 21 U.S.C. section 360bbb-3(b)(1), unless the authorization is terminated or revoked.     Resp Syncytial Virus by PCR NEGATIVE NEGATIVE Final    Comment: (NOTE) Fact Sheet for Patients: BloggerCourse.com  Fact Sheet for Healthcare Providers: SeriousBroker.it  This test is not yet approved or cleared by the [place] FDA and has been authorized for detection and/or diagnosis of SARS-CoV-2 by FDA under an Emergency Use Authorization (EUA). This EUA will remain in effect (meaning this test can be used) for the duration of the COVID-19 declaration under Section 564(b)(1) of the Act, 21 U.S.C. section 360bbb-3(b)(1), unless the authorization is terminated or revoked.  Performed at [facility], [address] [place], [place] [address]   Blood Culture (routine x 2)     Status: None   Collection Time: 05/26/23  8:31 PM   Specimen: BLOOD  Result Value Ref Range Status   Specimen Description BLOOD RIGHT ANTECUBITAL  Final   Special Requests   Final    BOTTLES DRAWN  AEROBIC AND ANAEROBIC Blood Culture adequate volume   Culture   Final    NO GROWTH 5 DAYS Performed at [facility], [address], [place], [place] [address]    Report Status 05/31/2023 FINAL  Final  Surgical pcr screen     Status: Abnormal   Collection Time: 05/27/23  9:11 PM   Specimen: Nasal Mucosa; Nasal Swab  Result Value Ref Range Status   MRSA, PCR NEGATIVE NEGATIVE Final   Staphylococcus aureus POSITIVE (A) NEGATIVE Final    Comment: (NOTE) The Xpert SA Assay (FDA approved for NASAL specimens in patients 46 years of age and older), is one component of a comprehensive surveillance program. It is not intended to diagnose infection nor to guide or monitor treatment. Performed at [facility], [address], [place], [place] [address]     Radiology Studies: No results found.    Eymi Lipuma T. Abagayle Klutts [facility]  If 7PM-7AM, please contact night-coverage [email or web address] 06/02/2023, 2:22 PM

## 2023-06-02 NOTE — Transfer of Care (Signed)
Immediate Anesthesia Transfer of Care Note  Patient: [name]  Procedure(s) Performed: LEFT TRANSMETATARSAL AMPUTATION (Left: Foot) APPLICATION OF WOUND VAC  Patient Location: PACU  Anesthesia Type:MAC and Regional  Level of Consciousness: awake, alert , and oriented  Airway & Oxygen Therapy: Patient Spontanous Breathing  Post-op Assessment: Report given to RN, Post -op Vital signs reviewed and stable, and Patient moving all extremities  Post vital signs: stable  Last Vitals:  Vitals Value Taken Time  BP 95/60 06/02/23 1402  Temp    Pulse 77 06/02/23 1407  Resp 18 06/02/23 1407  SpO2 96 % 06/02/23 1407  Vitals shown include unfiled device data.  Last Pain:  Vitals:   06/02/23 1225  TempSrc: Oral  PainSc:          Complications: There were no known notable events for this encounter.

## 2023-06-02 NOTE — Progress Notes (Signed)
Inpatient Rehab Admissions Coordinator:   Consult received and chart reviewed.  Pt admitted for 2 day hx of difficulty ambulating and decreased appetite, found to have osteomyelitis of L foot and now s/p L TMA.  Pre-op therapy recommendations for home health and would not expect to change significantly post op.  Also note clearly documented lack of caregiver support which would not be amenable to CIR admission.  We will sign off.    Estill Dooms, PT, DPT Admissions Coordinator [phone] 06/02/23  4:07 PM

## 2023-06-02 NOTE — Progress Notes (Signed)
Patient in short stay accompanied by his sister; during assessment patient has an episode of seizure. For 15 seconds patient became unresponsive, his head was shaking and turned to the left side. After 15 seconds patient able to answer questions, and patient denied any pain or other distress. VS stable. Dr. Jean Rosenthal made aware.

## 2023-06-02 NOTE — Op Note (Addendum)
06/02/2023  2:03 PM  PATIENT:  [name]    PRE-OPERATIVE DIAGNOSIS:  Osteomyelitis Left Forefoot with abscess  POST-OPERATIVE DIAGNOSIS:  Same  PROCEDURE:  LEFT TRANSMETATARSAL AMPUTATION,  APPLICATION OF WOUND VAC 13 cm. Application Kerecis micro graft 38 cm. Wound irrigation with Vashe  SURGEON:  [name], MD  PHYSICIAN ASSISTANT:None ANESTHESIA:   General  PREOPERATIVE INDICATIONS:  [name] is a  57 y.o. male with a diagnosis of Osteomyelitis Left Forefoot who failed conservative measures and elected for surgical management.    The risks benefits and alternatives were discussed with the patient preoperatively including but not limited to the risks of infection, bleeding, nerve injury, cardiopulmonary complications, the need for revision surgery, among others, and the patient was willing to proceed.  OPERATIVE IMPLANTS:   * No implants in log *  @ENCIMAGES @  OPERATIVE FINDINGS: Tissue margins were clear muscle had good color and contractility.  The dorsalis pedis artery was not patent.  OPERATIVE PROCEDURE: Patient was brought the operating room after undergoing regional anesthetic.  The left lower extremity was prepped using DuraPrep draped into a sterile field the infected toes were draped out of the sterile field with Ioban dressing.  A fishmouth incision was made proximal to the metatarsal heads an oscillating saw was used to perform a transmetatarsal amputation the infected forefoot was delivered in 1 block of tissue with no exposure of the infection to the wound.  Electrocautery was used hemostasis.  The wound was irrigated with Vashe.  The wound bed was filled with Kerecis micro graft 38 cm to cover a wound surface area greater than 50 cm.  The incision was closed using 2-0 nylon a Prevena wound VAC was applied this had a good suction fit patient was taken the PACU in stable condition.   DISCHARGE PLANNING:  Antibiotic duration:  Continue antibiotics for 24 hours  Weightbearing: Touchdown weightbearing on the left  Pain medication: Opioid pathway  Dressing care/ Wound VAC: Continue wound VAC for 1 week  Ambulatory devices: Walker or crutches  Discharge to: Home.  Follow-up: In the office 1 week post operative.

## 2023-06-02 NOTE — Plan of Care (Signed)
  Problem: Coping: Goal: Ability to adjust to condition or change in health will improve Outcome: Progressing   Problem: Clinical Measurements: Goal: Ability to maintain clinical measurements within normal limits will improve Outcome: Progressing

## 2023-06-02 NOTE — Progress Notes (Signed)
PT Cancellation Note  Patient Details Name: [name] MRN: [id number] DOB: 07-29-1966   Cancelled Treatment:    Reason Eval/Treat Not Completed: Patient at procedure or test/unavailable (Pt off the floor for TMA. Will follow up tomorrow for reeval.)   [name] 06/02/2023, 12:28 PM

## 2023-06-02 NOTE — Interval H&P Note (Signed)
History and Physical Interval Note:  06/02/2023 6:42 AM  [name]  has presented today for surgery, with the diagnosis of Osteomyelitis Left Forefoot.  The various methods of treatment have been discussed with the patient and family. After consideration of risks, benefits and other options for treatment, the patient has consented to  Procedure(s): LEFT TRANSMETATARSAL AMPUTATION (Left) as a surgical intervention.  The patient's history has been reviewed, patient examined, no change in status, stable for surgery.  I have reviewed the patient's chart and labs.  Questions were answered to the patient's satisfaction.     [name]

## 2023-06-03 ENCOUNTER — Encounter (HOSPITAL_COMMUNITY): Payer: Self-pay | Admitting: Orthopedic Surgery

## 2023-06-03 LAB — RENAL FUNCTION PANEL
Albumin: 2.2 g/dL — ABNORMAL LOW (ref 3.5–5.0)
Anion gap: 9 (ref 5–15)
BUN: 24 mg/dL — ABNORMAL HIGH (ref 6–20)
CO2: 25 mmol/L (ref 22–32)
Calcium: 8.3 mg/dL — ABNORMAL LOW (ref 8.9–10.3)
Chloride: 97 mmol/L — ABNORMAL LOW (ref 98–111)
Creatinine, Ser: 0.78 mg/dL (ref 0.61–1.24)
GFR, Estimated: >60 mL/min (ref >60)
Glucose, Bld: 145 mg/dL — ABNORMAL HIGH (ref 70–99)
Phosphorus: 3.5 mg/dL (ref 2.5–4.6)
Potassium: 4.2 mmol/L (ref 3.5–5.1)
Sodium: 131 mmol/L — ABNORMAL LOW (ref 135–145)

## 2023-06-03 LAB — RETICULOCYTES
Immature Retic Fract: 17.8 % — ABNORMAL HIGH (ref 2.3–15.9)
RBC.: 3.07 MIL/uL — ABNORMAL LOW (ref 4.22–5.81)
Retic Count, Absolute: 82.6 10*3/uL (ref 19.0–186.0)
Retic Ct Pct: 2.7 % (ref 0.4–3.1)

## 2023-06-03 LAB — CBC
HCT: 26.6 % — ABNORMAL LOW (ref 39.0–52.0)
Hemoglobin: 8.8 g/dL — ABNORMAL LOW (ref 13.0–17.0)
MCH: 28.6 pg (ref 26.0–34.0)
MCHC: 33.1 g/dL (ref 30.0–36.0)
MCV: 86.4 fL (ref 80.0–100.0)
Platelets: 405 10*3/uL — ABNORMAL HIGH (ref 150–400)
RBC: 3.08 MIL/uL — ABNORMAL LOW (ref 4.22–5.81)
RDW: 11.6 % (ref 11.5–15.5)
WBC: 8.5 10*3/uL (ref 4.0–10.5)
nRBC: 0 % (ref 0.0–0.2)

## 2023-06-03 LAB — MAGNESIUM: Magnesium: 1.8 mg/dL (ref 1.7–2.4)

## 2023-06-03 LAB — GLUCOSE, CAPILLARY
Glucose-Capillary: 160 mg/dL — ABNORMAL HIGH (ref 70–99)
Glucose-Capillary: 142 mg/dL — ABNORMAL HIGH (ref 70–99)
Glucose-Capillary: 176 mg/dL — ABNORMAL HIGH (ref 70–99)
Glucose-Capillary: 158 mg/dL — ABNORMAL HIGH (ref 70–99)

## 2023-06-03 LAB — FOLATE: Folate: 5.6 ng/mL — ABNORMAL LOW (ref >5.9)

## 2023-06-03 NOTE — Progress Notes (Signed)
Physical Therapy Treatment Patient Details Name: [name] MRN: [id number] DOB: January 06, 1966 Today's Date: 06/03/2023   History of Present Illness Pt is a 57 year old male admitted for evaluation of being unable to get up and ambulate, and not eating for 2 days. 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    PT Comments  Pt with fair tolerance to treatment today. Pt was able to transfer to chair with Min G RW however required constant cues for WB precautions and safety with RW. No change in DC/DME recs at this time. PT will continue to follow.    If plan is discharge home, recommend the following: A little help with walking and/or transfers;A little help with bathing/dressing/bathroom;Assistance with cooking/housework;Direct supervision/assist for medications management;Assist for transportation;Help with stairs or ramp for entrance   Can travel by private vehicle        Equipment Recommendations  Wheelchair (measurements PT);Wheelchair cushion (measurements PT);BSC/3in1    Recommendations for Other Services       Precautions / Restrictions Precautions Precautions: Fall Restrictions Weight Bearing Restrictions: Yes LLE Weight Bearing: Touchdown weight bearing     Mobility  Bed Mobility Overal bed mobility: Modified Independent                  Transfers Overall transfer level: Needs assistance Equipment used: Rolling walker (2 wheels) Transfers: Sit to/from Stand, Bed to chair/wheelchair/BSC Sit to Stand: Supervision   Step pivot transfers: Min guard       General transfer comment: Pt requires multiple cues for WB precautions. When transferring to chair pt required cues to for safety and proximity to RW.    Ambulation/Gait                   Stairs             Wheelchair Mobility      Tilt Bed    Modified Rankin (Stroke Patients Only)       Balance Overall balance assessment: Needs assistance Sitting-balance support: No upper extremity supported, Feet supported Sitting balance-Leahy Scale: Normal     Standing balance support: Bilateral upper extremity supported, During functional activity Standing balance-Leahy Scale: Poor Standing balance comment: Reliant on RW                            Cognition Arousal/Alertness: Awake/alert Behavior During Therapy: Flat affect, WFL for tasks assessed/performed Overall Cognitive Status: History of cognitive impairments - at baseline                                 General Comments: Per chart pt has history of TBI with residual deficits. Difficulty following commands at        Exercises      General Comments General comments (skin integrity, edema, etc.): VSS      Pertinent Vitals/Pain Pain Assessment Pain Assessment: No/denies pain    Home Living                          Prior Function            PT Goals (current goals can now be found in the care plan section) Progress towards PT goals: Progressing toward goals    Frequency    Min  1X/week      PT Plan Current plan remains appropriate    Co-evaluation              AM-PAC PT "6 Clicks" Mobility   Outcome Measure  Help needed turning from your back to your side while in a flat bed without using bedrails?: None Help needed moving from lying on your back to sitting on the side of a flat bed without using bedrails?: None Help needed moving to and from a bed to a chair (including a wheelchair)?: A Little Help needed standing up from a chair using your arms (e.g., wheelchair or bedside chair)?: A Little Help needed to walk in hospital room?: A Little Help needed climbing 3-5 steps with a railing? : A Lot 6 Click Score: 19    End of Session Equipment Utilized During Treatment: Gait belt Activity  Tolerance: Patient tolerated treatment well;Patient limited by fatigue Patient left: in chair;with call bell/phone within reach;with chair alarm set Nurse Communication: Mobility status PT Visit Diagnosis: Other abnormalities of gait and mobility (R26.89)     Time: 2725-3664 PT Time Calculation (min) (ACUTE ONLY): 21 min  Charges:    $Therapeutic Activity: 8-22 mins PT General Charges $$ ACUTE PT VISIT: 1 Visit                     Shela Nevin, PT, DPT Acute Rehab Services [phone]    [name] 06/03/2023, 10:53 AM

## 2023-06-03 NOTE — Plan of Care (Signed)
Problem: Education: Goal: Ability to describe self-care measures that may prevent or decrease complications (Diabetes Survival Skills Education) will improve Outcome: Progressing Goal: Individualized Educational Video(s) Outcome: Progressing   Problem: Coping: Goal: Ability to adjust to condition or change in health will improve Outcome: Progressing   Problem: Fluid Volume: Goal: Ability to maintain a balanced intake and output will improve Outcome: Progressing   Problem: Health Behavior/Discharge Planning: Goal: Ability to identify and utilize available resources and services will improve Outcome: Progressing Goal: Ability to manage health-related needs will improve Outcome: Progressing   Problem: Metabolic: Goal: Ability to maintain appropriate glucose levels will improve Outcome: Progressing   Problem: Nutritional: Goal: Maintenance of adequate nutrition will improve Outcome: Progressing Goal: Progress toward achieving an optimal weight will improve Outcome: Progressing   Problem: Skin Integrity: Goal: Risk for impaired skin integrity will decrease Outcome: Progressing   Problem: Tissue Perfusion: Goal: Adequacy of tissue perfusion will improve Outcome: Progressing   Problem: Education: Goal: Knowledge of General Education information will improve Description: Including pain rating scale, medication(s)/side effects and non-pharmacologic comfort measures Outcome: Progressing   Problem: Health Behavior/Discharge Planning: Goal: Ability to manage health-related needs will improve Outcome: Progressing   Problem: Clinical Measurements: Goal: Ability to maintain clinical measurements within normal limits will improve Outcome: Progressing Goal: Will remain free from infection Outcome: Progressing Goal: Diagnostic test results will improve Outcome: Progressing Goal: Respiratory complications will improve Outcome: Progressing Goal: Cardiovascular complication will  be avoided Outcome: Progressing   Problem: Activity: Goal: Risk for activity intolerance will decrease Outcome: Progressing   Problem: Nutrition: Goal: Adequate nutrition will be maintained Outcome: Progressing   Problem: Coping: Goal: Level of anxiety will decrease Outcome: Progressing   Problem: Elimination: Goal: Will not experience complications related to bowel motility Outcome: Progressing Goal: Will not experience complications related to urinary retention Outcome: Progressing   Problem: Pain Managment: Goal: General experience of comfort will improve Outcome: Progressing   Problem: Safety: Goal: Ability to remain free from injury will improve Outcome: Progressing   Problem: Skin Integrity: Goal: Risk for impaired skin integrity will decrease Outcome: Progressing   Problem: Education: Goal: Knowledge of General Education information will improve Description: Including pain rating scale, medication(s)/side effects and non-pharmacologic comfort measures Outcome: Progressing   Problem: Health Behavior/Discharge Planning: Goal: Ability to manage health-related needs will improve Outcome: Progressing   Problem: Clinical Measurements: Goal: Ability to maintain clinical measurements within normal limits will improve Outcome: Progressing Goal: Will remain free from infection Outcome: Progressing Goal: Diagnostic test results will improve Outcome: Progressing Goal: Respiratory complications will improve Outcome: Progressing Goal: Cardiovascular complication will be avoided Outcome: Progressing   Problem: Activity: Goal: Risk for activity intolerance will decrease Outcome: Progressing   Problem: Nutrition: Goal: Adequate nutrition will be maintained Outcome: Progressing   Problem: Coping: Goal: Level of anxiety will decrease Outcome: Progressing   Problem: Elimination: Goal: Will not experience complications related to bowel motility Outcome:  Progressing Goal: Will not experience complications related to urinary retention Outcome: Progressing   Problem: Pain Managment: Goal: General experience of comfort will improve Outcome: Progressing   Problem: Safety: Goal: Ability to remain free from injury will improve Outcome: Progressing   Problem: Skin Integrity: Goal: Risk for impaired skin integrity will decrease Outcome: Progressing   Problem: Education: Goal: Knowledge of the prescribed therapeutic regimen will improve Outcome: Progressing Goal: Ability to verbalize activity precautions or restrictions will improve Outcome: Progressing Goal: Understanding of discharge needs will improve Outcome: Progressing   Problem: Activity: Goal: Ability  to perform//tolerate increased activity and mobilize with assistive devices will improve Outcome: Progressing

## 2023-06-03 NOTE — Progress Notes (Signed)
Patient ID: [name], male   DOB: 1966/04/29, 57 y.o.   MRN: [id number] Patient without complaints.  There is 25 cc in the wound VAC canister.  Patient planning on discharge to home.  Will have the wound VAC dressing removed at time of discharge.

## 2023-06-03 NOTE — Progress Notes (Signed)
Mobility Specialist Progress Note   06/03/23 1415  Mobility  Activity Transferred from bed to chair  Level of Assistance Minimal assist, patient does 75% or more  Assistive Device Other (Comment) (HHA)  Range of Motion/Exercises Active;All extremities  LLE Weight Bearing TWB  Activity Response Tolerated well   Patient received in supine and agreeable to participate. Virtual spanish interpreter Rayfield Citizen [phone] translated throughout session. Completed bed mobility independently and stood x2 with minimal HHA. On the initial stand patient complained of pain and immediately returned to bed. Completed stand-pivot to chair with min A on second stand trial. Tolerated without incident and was left in recliner with all needs met, call bell in reach.  [name], BS EXP Mobility Specialist Please contact via SecureChat or Rehab office at [phone]

## 2023-06-03 NOTE — Plan of Care (Signed)
  Problem: Fluid Volume: Goal: Ability to maintain a balanced intake and output will improve Outcome: Progressing   Problem: Metabolic: Goal: Ability to maintain appropriate glucose levels will improve Outcome: Progressing   

## 2023-06-03 NOTE — Progress Notes (Signed)
PROGRESS NOTE  [name] YQM:[id number] DOB: 08-23-1966   PCP: Marcine Matar, MD  Patient is from: Home.  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 He was found to have L foot gangrene with osteomyelitis. Dr. Lajoyce Corners was consulted and he is scheduled for TMA on 7/28. He is on Rocephin and Vancomycin. Dr. Edilia Bo consulted, believes he has adequate circulation for surgery and recommended reconsulting if signs of poor circulation during surgery.  Underwent left TMA with wound VAC placement by Dr. Lajoyce Corners on 7/31.  He will be TDWB per ortho.  Therapy recommended home health but no safe disposition   Subjective: Seen and examined earlier this morning.  No major events overnight of this morning.  No complaints but not a great historian.  He is only oriented to self.  Denies pain.  SItting on bedside chair.  Objective: Vitals:   06/02/23 1944 06/03/23 0031 06/03/23 0530 06/03/23 0716  BP: 103/71 130/75 116/70 121/72  Pulse: 78 80 90 86  Resp: 18 16 16 18   Temp: 98 F (36.7 C) 98 F (36.7 C) 97.9 F (36.6 C) (!) 97.5 F (36.4 C)  TempSrc:  Oral Oral   SpO2: 99% 99% 100% 98%  Weight:        Examination:  GENERAL: No apparent distress.  Nontoxic. HEENT: MMM.  Vision and hearing grossly intact.  NECK: Supple.  No apparent JVD.  RESP:  No IWOB.  Fair aeration bilaterally. CVS:  RRR. Heart sounds normal.  ABD/GI/GU: BS+. Abd soft, NTND.  MSK/EXT:  Moves extremities. S/p left TMA.  Wound VAC in place.  Serosanguineous fluid in canister. SKIN: As above. NEURO: Awake and alert.  Oriented to self.  Follows commands.  No apparent focal neuro deficit. PSYCH: Calm. Normal affect.    Procedures:  7/31-left TMA by Dr. Lajoyce Corners  Microbiology summarized: COVID-19, influenza and RSV PCR nonreactive Blood cultures NGTD  Assessment and plan: Principal Problem:   Cellulitis of left foot Active Problems:   Essential hypertension   Type 2 diabetes mellitus with peripheral neuropathy (HCC)   Seizure disorder (HCC)   MDD (major depressive disorder), recurrent episode, moderate (HCC)   History of traumatic brain injury   Cutaneous abscess of left foot   PAD (peripheral artery disease) (HCC)   Severe protein-calorie malnutrition (HCC)  Left foot cellulitis with gangrene/osteomyelitis -MRI with osteo of the 2nd toe and plantar muscles suggesting myositis -Vascular has consulted and there is no indication for vascular intervention at this time -S/p left TMA and wound VAC placement by Dr. Lajoyce Corners on 7/31. -Per Ortho, surgical source control achieved.  Antibiotic discontinued. -TDWB on the left -Pain control and VTE prophylaxis per surgery -PT/OT   H/o TBI/cognitive impairment -Continue Prozac, hydroxyzine, and Risperdal   Unsafe living situation: Sister says living in a [place], unsafe living situation.  Patient with TBI/cognitive impairment.  Has no medical decision-making capacity. -TOC consulted    Controlled DM-2 with hyperglycemia: A1c 5.8%. Recent Labs  Lab 06/02/23 1407 06/02/23 1618 06/02/23 1944 06/03/23 0717 06/03/23 1139  GLUCAP 119* 158* 139* 160* 142*  -Continue current insulin regimen  Normocytic anemia: H&H stable. -Continue monitoring -Check anemia panel.   Seizure disorder  -Continue Keppra  HLD -Continue atorvastatin   HTN -Continue lisinopril  Hyponatremia: Mild. -Continue monitoring.   Body mass index is 22.02 kg/m.          DVT prophylaxis:  SCD's Start: 06/02/23 1515 enoxaparin (LOVENOX) injection 40 mg Start: 05/27/23 1000  Code Status: Full code Family Communication: None at bedside Level of care: Med-Surg Status  is: Inpatient Remains inpatient appropriate because: Left foot infection/osteomyelitis   Final disposition: TBD. Consultants:  Orthopedic surgery  35 minutes with more than 50% spent in reviewing records, counseling patient/family and coordinating care.   Sch Meds:  Scheduled Meds:  vitamin C  1,000 mg Oral Daily   atorvastatin  10 mg Oral Daily   docusate sodium  100 mg Oral Daily   enoxaparin (LOVENOX) injection  40 mg Subcutaneous Daily   ferrous sulfate  324 mg Oral Q breakfast   FLUoxetine  30 mg Oral Daily   insulin aspart  0-5 Units Subcutaneous QHS   insulin aspart  0-6 Units Subcutaneous TID WC   insulin glargine-yfgn  10 Units Subcutaneous QHS   levETIRAcetam  500 mg Oral BID   lisinopril  2.5 mg Oral Daily   nutrition supplement (JUVEN)  1 packet Oral BID BM   pantoprazole  40 mg Oral Daily   risperiDONE  0.25 mg Oral QHS   zinc sulfate  220 mg Oral Daily   Continuous Infusions:  sodium chloride     magnesium sulfate bolus IVPB     PRN Meds:.acetaminophen **OR** acetaminophen, alum & mag hydroxide-simeth, bisacodyl, guaiFENesin-dextromethorphan, hydrALAZINE, HYDROmorphone (DILAUDID) injection, labetalol, magnesium citrate, magnesium sulfate bolus IVPB, metoprolol tartrate, ondansetron **OR** ondansetron (ZOFRAN) IV, oxyCODONE, oxyCODONE, phenol, polyethylene glycol, potassium chloride, senna-docusate  Antimicrobials: Anti-infectives (From admission, onward)    Start     Dose/Rate Route Frequency Ordered Stop   06/03/23 0600  ceFAZolin (ANCEF) IVPB 2g/100 mL premix        2 g 200 mL/hr over 30 Minutes Intravenous On call to O.R. 06/02/23 1149 06/02/23 1330   06/02/23 2200  ceFAZolin (ANCEF) IVPB 2g/100 mL premix        2 g 200 mL/hr over 30 Minutes Intravenous Every 8 hours 06/02/23 1514 06/03/23 0639   06/02/23 1142  ceFAZolin (ANCEF) 2-4 GM/100ML-% IVPB       Note to Pharmacy: [facility], [name]: cabinet override      06/02/23 1142 06/02/23 1330   06/01/23 1715   metroNIDAZOLE (FLAGYL) IVPB 500 mg  Status:  Discontinued        500 mg 100 mL/hr over 60 Minutes Intravenous Every 12 hours 06/01/23 1615 06/02/23 1515   05/30/23 0800  ceFAZolin (ANCEF) IVPB 2g/100 mL premix        2 g 200 mL/hr over 30 Minutes Intravenous On call to O.R. 05/30/23 0412 05/30/23 1209   05/28/23 1500  vancomycin (VANCOREADY) IVPB 1250 mg/250 mL  Status:  Discontinued        1,250 mg 166.7 mL/hr over 90 Minutes Intravenous Every 24 hours 05/28/23 1211 06/02/23 1515   05/28/23 1145  vancomycin (VANCOREADY) IVPB 1250 mg/250 mL  Status:  Discontinued        1,250 mg 166.7 mL/hr over 90 Minutes Intravenous Every 24 hours 05/28/23 1051 05/28/23 1211   05/26/23 2030  cefTRIAXone (ROCEPHIN) 2 g in sodium chloride 0.9 % 100 mL IVPB        2 g 200 mL/hr over 30 Minutes Intravenous Every 24 hours 05/26/23 2026 06/01/23 1845        I  have personally reviewed the following labs and images: CBC: Recent Labs  Lab 05/28/23 0208 05/29/23 0408 05/30/23 0229 06/02/23 0112 06/03/23 0902  WBC 9.8 10.5 8.8 9.3 8.5  NEUTROABS  --   --  6.9 7.4  --   HGB 8.6* 8.3* 8.1* 8.0* 8.8*  HCT 25.3* 24.2* 23.8* 23.9* 26.6*  MCV 86.3 86.7 86.5 85.1 86.4  PLT 362 358 341 360 405*   BMP &GFR Recent Labs  Lab 05/28/23 0208 05/29/23 0408 05/30/23 0229 06/02/23 0112 06/03/23 0902  NA 134* 131* 134* 133* 131*  K 4.2 3.9 4.2 4.5 4.2  CL 97* 97* 99 98 97*  CO2 31 27 29 28 25   GLUCOSE 213* 188* 167* 117* 145*  BUN 25* 16 15 17  24*  CREATININE 0.88 0.77 0.78 0.77 0.78  CALCIUM 8.2* 7.8* 7.9* 8.3* 8.3*  MG  --   --   --  1.8 1.8  PHOS  --   --   --  3.1 3.5   Estimated Creatinine Clearance: 78.7 mL/min (by C-G formula based on SCr of 0.78 mg/dL). Liver & Pancreas: Recent Labs  Lab 06/02/23 0112 06/03/23 0902  ALBUMIN 2.0* 2.2*   No results for input(s): "LIPASE", "AMYLASE" in the last 168 hours. No results for input(s): "AMMONIA" in the last 168 hours. Diabetic: No results for  input(s): "HGBA1C" in the last 72 hours. Recent Labs  Lab 06/02/23 1407 06/02/23 1618 06/02/23 1944 06/03/23 0717 06/03/23 1139  GLUCAP 119* 158* 139* 160* 142*   Cardiac Enzymes: No results for input(s): "CKTOTAL", "CKMB", "CKMBINDEX", "TROPONINI" in the last 168 hours. No results for input(s): "PROBNP" in the last 8760 hours. Coagulation Profile: No results for input(s): "INR", "PROTIME" in the last 168 hours.  Thyroid Function Tests: No results for input(s): "TSH", "T4TOTAL", "FREET4", "T3FREE", "THYROIDAB" in the last 72 hours. Lipid Profile: No results for input(s): "CHOL", "HDL", "LDLCALC", "TRIG", "CHOLHDL", "LDLDIRECT" in the last 72 hours. Anemia Panel: No results for input(s): "VITAMINB12", "FOLATE", "FERRITIN", "TIBC", "IRON", "RETICCTPCT" in the last 72 hours. Urine analysis:    Component Value Date/Time   COLORURINE AMBER (A) 05/26/2023 2026   APPEARANCEUR CLEAR 05/26/2023 2026   APPEARANCEUR Clear 01/23/2021 1634   LABSPEC 1.020 05/26/2023 2026   PHURINE 5.0 05/26/2023 2026   GLUCOSEU NEGATIVE 05/26/2023 2026   GLUCOSEU NEGATIVE 10/01/2020 0853   HGBUR NEGATIVE 05/26/2023 2026   BILIRUBINUR NEGATIVE 05/26/2023 2026   BILIRUBINUR Negative 01/23/2021 1634   KETONESUR 5 (A) 05/26/2023 2026   PROTEINUR 100 (A) 05/26/2023 2026   UROBILINOGEN 0.2 10/01/2020 0853   NITRITE NEGATIVE 05/26/2023 2026   LEUKOCYTESUR NEGATIVE 05/26/2023 2026   Sepsis Labs: Invalid input(s): "PROCALCITONIN", "LACTICIDVEN"  Microbiology: Recent Results (from the past 240 hour(s))  Blood Culture (routine x 2)     Status: None   Collection Time: 05/26/23  8:23 PM   Specimen: BLOOD RIGHT FOREARM  Result Value Ref Range Status   Specimen Description BLOOD RIGHT FOREARM  Final   Special Requests   Final    BOTTLES DRAWN AEROBIC AND ANAEROBIC Blood Culture adequate volume   Culture   Final    NO GROWTH 5 DAYS Performed at [facility], [address], [place], [place]  [address]    Report Status 05/31/2023 FINAL  Final  Resp panel by RT-PCR (RSV, Flu A&B, Covid) Anterior Nasal Swab     Status: None   Collection Time: 05/26/23  8:26 PM   Specimen: Anterior Nasal Swab  Result Value Ref  Range Status   SARS Coronavirus 2 by RT PCR NEGATIVE NEGATIVE Final   Influenza A by PCR NEGATIVE NEGATIVE Final   Influenza B by PCR NEGATIVE NEGATIVE Final    Comment: (NOTE) The Xpert Xpress SARS-CoV-2/FLU/RSV plus assay is intended as an aid in the diagnosis of influenza from Nasopharyngeal swab specimens and should not be used as a sole basis for treatment. Nasal washings and aspirates are unacceptable for Xpert Xpress SARS-CoV-2/FLU/RSV testing.  Fact Sheet for Patients: BloggerCourse.com  Fact Sheet for Healthcare Providers: SeriousBroker.it  This test is not yet approved or cleared by the [place] FDA and has been authorized for detection and/or diagnosis of SARS-CoV-2 by FDA under an Emergency Use Authorization (EUA). This EUA will remain in effect (meaning this test can be used) for the duration of the COVID-19 declaration under Section 564(b)(1) of the Act, 21 U.S.C. section 360bbb-3(b)(1), unless the authorization is terminated or revoked.     Resp Syncytial Virus by PCR NEGATIVE NEGATIVE Final    Comment: (NOTE) Fact Sheet for Patients: BloggerCourse.com  Fact Sheet for Healthcare Providers: SeriousBroker.it  This test is not yet approved or cleared by the [place] FDA and has been authorized for detection and/or diagnosis of SARS-CoV-2 by FDA under an Emergency Use Authorization (EUA). This EUA will remain in effect (meaning this test can be used) for the duration of the COVID-19 declaration under Section 564(b)(1) of the Act, 21 U.S.C. section 360bbb-3(b)(1), unless the authorization is terminated or revoked.  Performed at [facility], [address] [place], [place] [address]   Blood Culture (routine x 2)     Status: None   Collection Time: 05/26/23  8:31 PM   Specimen: BLOOD  Result Value Ref Range Status   Specimen Description BLOOD RIGHT ANTECUBITAL  Final   Special Requests   Final    BOTTLES DRAWN AEROBIC AND ANAEROBIC Blood Culture adequate volume   Culture   Final    NO GROWTH 5 DAYS Performed at [facility], [address] [place], [place] [address]    Report Status 05/31/2023 FINAL  Final  Surgical pcr screen     Status: Abnormal   Collection Time: 05/27/23  9:11 PM   Specimen: Nasal Mucosa; Nasal Swab  Result Value Ref Range Status   MRSA, PCR NEGATIVE NEGATIVE Final   Staphylococcus aureus POSITIVE (A) NEGATIVE Final    Comment: (NOTE) The Xpert SA Assay (FDA approved for NASAL specimens in patients 58 years of age and older), is one component of a comprehensive surveillance program. It is not intended to diagnose infection nor to guide or monitor treatment. Performed at [facility], [address], [place], [place] [address]     Radiology Studies: No results found.    Jakyrah Holladay T. Gaylin Osoria [facility]  If 7PM-7AM, please contact night-coverage [email or web address] 06/03/2023, 3:43 PM

## 2023-06-04 LAB — GLUCOSE, CAPILLARY
Glucose-Capillary: 195 mg/dL — ABNORMAL HIGH (ref 70–99)
Glucose-Capillary: 125 mg/dL — ABNORMAL HIGH (ref 70–99)
Glucose-Capillary: 107 mg/dL — ABNORMAL HIGH (ref 70–99)
Glucose-Capillary: 137 mg/dL — ABNORMAL HIGH (ref 70–99)
Glucose-Capillary: 169 mg/dL — ABNORMAL HIGH (ref 70–99)

## 2023-06-04 LAB — RENAL FUNCTION PANEL
Albumin: 2 g/dL — ABNORMAL LOW (ref 3.5–5.0)
Anion gap: 8 (ref 5–15)
BUN: 27 mg/dL — ABNORMAL HIGH (ref 6–20)
CO2: 27 mmol/L (ref 22–32)
Calcium: 8.4 mg/dL — ABNORMAL LOW (ref 8.9–10.3)
Chloride: 100 mmol/L (ref 98–111)
Creatinine, Ser: 1.03 mg/dL (ref 0.61–1.24)
GFR, Estimated: >60 mL/min (ref >60)
Glucose, Bld: 143 mg/dL — ABNORMAL HIGH (ref 70–99)
Phosphorus: 3.2 mg/dL (ref 2.5–4.6)
Potassium: 4.5 mmol/L (ref 3.5–5.1)
Sodium: 135 mmol/L (ref 135–145)

## 2023-06-04 LAB — RETICULOCYTES
Immature Retic Fract: 19 % — ABNORMAL HIGH (ref 2.3–15.9)
RBC.: 2.84 MIL/uL — ABNORMAL LOW (ref 4.22–5.81)
Retic Count, Absolute: 72.7 10*3/uL (ref 19.0–186.0)
Retic Ct Pct: 2.6 % (ref 0.4–3.1)

## 2023-06-04 LAB — VITAMIN B12: Vitamin B-12: 704 pg/mL (ref 180–914)

## 2023-06-04 LAB — IRON AND TIBC
Iron: 32 ug/dL — ABNORMAL LOW (ref 45–182)
Saturation Ratios: 13 % — ABNORMAL LOW (ref 17.9–39.5)
TIBC: 245 ug/dL — ABNORMAL LOW (ref 250–450)
UIBC: 213 ug/dL

## 2023-06-04 LAB — FERRITIN: Ferritin: 171 ng/mL (ref 24–336)

## 2023-06-04 NOTE — TOC Initial Note (Signed)
Transition of Care [facility]) - Initial/Assessment Note    Patient Details  Name: [name] MRN: [id number] Date of Birth: [birth year]  [facility] CM/SW Contact:    Lorri Frederick, LCSW Phone Number: 06/04/2023, 1:11 PM  Clinical Narrative:     CSW met with pt with assistance from [facility] health spanish interpreter for initial assessment.  CSW informed previously that concerns were reported by outside CSW for pt living conditions.  CSW asked basic orientation questions and pt oriented x3 to person, place, situation.  Pt was not close in identifying correct date.  Pt able to identify that he is here for surgery on his foot.  Pt able to participate in conversation, reports he stays with a roommate, plans to return.  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   He does plan to return to work once his foot has healed.  Permission given to speak with his sister Karna Christmas.              Expected Discharge Plan: Home w Home Health Services Barriers to Discharge: No Barriers Identified   Patient Goals and CMS Choice            Expected Discharge Plan and Services       Living arrangements for the past 2 months: Single Family Home                                      Prior Living Arrangements/Services Living arrangements for the past 2 months: Single Family Home Lives with:: Roommate Patient language and need for interpreter reviewed:: Yes (spanish face to face interpretor used-[facility] staff)                 Activities of Daily Living      Permission Sought/Granted Permission sought to share information with : Family Supports Permission granted to share information with : Yes, Verbal Permission Granted  Share Information with NAME: sister [name]           Emotional Assessment Appearance:: Appears stated age Attitude/Demeanor/Rapport: Engaged Affect (typically observed): Pleasant Orientation: :  (per epic,  orientation fluctuates)      Admission diagnosis:  Cellulitis of left foot [L03.116] Patient Active Problem List   Diagnosis Date Noted   Cutaneous abscess of left foot 05/27/2023   PAD (peripheral artery disease) (HCC) 05/27/2023   Severe protein-calorie malnutrition (HCC) 05/27/2023   Cellulitis of left foot 05/26/2023   Burnout of caregiver 07/09/2022   Aggressive behavior of adult 07/09/2022   Gastric polyp    Gastritis and gastroduodenitis    History of traumatic brain injury 05/29/2021   MDD (major depressive disorder), recurrent, in partial remission (HCC) 11/12/2020   MDD (major depressive disorder), recurrent episode, moderate (HCC) 07/16/2020   Influenza vaccine needed 07/14/2019   Microalbuminuria due to type 2 diabetes mellitus (HCC) 04/11/2019   Hypothyroidism 09/21/2018   Seizure disorder (HCC) 06/16/2018   Low testosterone 06/16/2018   Anemia, chronic disease 10/29/2017   Positive depression screening 10/29/2017   Attention and concentration deficit    Type 2 diabetes mellitus with peripheral neuropathy (HCC)    S/P craniotomy 03/19/2017   Essential hypertension    Dysphagia, oropharyngeal 02/01/2017   Diabetes type 2, uncontrolled 06/13/2015   PCP:  Marcine Matar, MD Pharmacy:   [facility] [address], [address] [place] [address] Phone: [phone] Fax: [phone]     Social Determinants of Health (SDOH) Social History: SDOH Screenings   Depression (PHQ2-9): Low Risk  (03/11/2023)  Tobacco Use: Low Risk  (06/02/2023)   SDOH Interventions:     Readmission Risk Interventions     No data to display
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
## 2023-06-04 NOTE — Progress Notes (Signed)
Physical Therapy Treatment Patient Details Name: [name] MRN: [id number] DOB: 01-22-1966 Today's Date: 06/04/2023   History of Present Illness Pt is a 57 year old male admitted for evaluation of being unable to get up and ambulate, and not eating for 2 days. 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    PT Comments  Pt with fair tolerance to treatment today. Pt was able to sit EOB Mod I however once seated EOB pt would immediately lay back down without specifying why. Pt did this twice and interpreter had difficult time understanding him. On last attempt pt noted to have BM and refused further mobility. No change in DC/DME recs at this time. PT will continue to follow.   If plan is discharge home, recommend the following: A little help with walking and/or transfers;A little help with bathing/dressing/bathroom;Assistance with cooking/housework;Direct supervision/assist for medications management;Assist for transportation;Help with stairs or ramp for entrance   Can travel by private vehicle        Equipment Recommendations  Wheelchair (measurements PT);Wheelchair cushion (measurements PT);BSC/3in1    Recommendations for Other Services       Precautions / Restrictions Precautions Precautions: Fall Restrictions Weight Bearing Restrictions: Yes LLE Weight Bearing: Touchdown weight bearing     Mobility  Bed Mobility Overal bed mobility: Modified Independent             General bed mobility comments: Pt was able to sit EOB however once seated EOB pt would immediately lay back down without specifying why. Pt did this twice and interpreter had difficult time understanding him. On last attempt pt noted to have BM and refused further mobility.    Transfers                   General transfer comment: Declined after  BM    Ambulation/Gait                   Stairs             Wheelchair Mobility     Tilt Bed    Modified Rankin (Stroke Patients Only)       Balance Overall balance assessment: Needs assistance Sitting-balance support: No upper extremity supported, Feet supported Sitting balance-Leahy Scale: Normal                                      Cognition Arousal/Alertness: Awake/alert Behavior During Therapy: Flat affect, WFL for tasks assessed/performed Overall Cognitive Status: History of cognitive impairments - at baseline                                 General Comments: Per chart pt has history of TBI with residual deficits. Difficulty following commands at times.        Exercises      General Comments General comments (skin integrity, edema, etc.): VSS      Pertinent Vitals/Pain Pain Assessment Pain Assessment: No/denies pain    Home Living                          Prior Function            PT Goals (current goals can now be found in the care  plan section) Progress towards PT goals: Progressing toward goals    Frequency    Min 1X/week      PT Plan Current plan remains appropriate    Co-evaluation              AM-PAC PT "6 Clicks" Mobility   Outcome Measure  Help needed turning from your back to your side while in a flat bed without using bedrails?: None Help needed moving from lying on your back to sitting on the side of a flat bed without using bedrails?: None Help needed moving to and from a bed to a chair (including a wheelchair)?: A Little Help needed standing up from a chair using your arms (e.g., wheelchair or bedside chair)?: A Little Help needed to walk in hospital room?: A Little Help needed climbing 3-5 steps with a railing? : A Lot 6 Click Score: 19    End of Session Equipment Utilized During Treatment: Gait belt Activity Tolerance: Other (comment) (Session limited due to  cognitive deficits and pt having BM) Patient left: in bed;with call bell/phone within reach Nurse Communication: Mobility status PT Visit Diagnosis: Other abnormalities of gait and mobility (R26.89)     Time: 1301-1309 PT Time Calculation (min) (ACUTE ONLY): 8 min  Charges:    $Therapeutic Activity: 8-22 mins PT General Charges $$ ACUTE PT VISIT: 1 Visit                     Shela Nevin, PT, DPT Acute Rehab Services [phone]    [name] 06/04/2023, 1:38 PM

## 2023-06-04 NOTE — Progress Notes (Signed)
Mobility Specialist Progress Note   06/04/23 1340  Mobility  Activity Stood at bedside (STS x3)  Level of Assistance Minimal assist, patient does 75% or more  Assistive Device Front wheel walker  Range of Motion/Exercises Active;All extremities  LLE Weight Bearing TWB  Activity Response Tolerated well   Patient received in supine and agreeable to participate. Deferred transferring to chair for unspecified reasons. Opted to bed level exercise while dangling EOB. Completed STS x3 with min A. Needed verbal cueing for hand placement and sequencing but was able to adhere to TWB. Upon third standing attempt, patient impulsively sat down then transition to supine. When asked if he wanted to continue exercises, he stated he was tired. Denied dizziness, SOB or pain. Tolerated without complaint or incident. Was left in supine with all needs met, call bell in reach.   [place] , BS EXP Mobility Specialist Please contact via SecureChat or Rehab office at [phone]

## 2023-06-04 NOTE — Plan of Care (Signed)
Problem: Education: Goal: Ability to describe self-care measures that may prevent or decrease complications (Diabetes Survival Skills Education) will improve Outcome: Progressing Goal: Individualized Educational Video(s) Outcome: Progressing   Problem: Coping: Goal: Ability to adjust to condition or change in health will improve Outcome: Progressing   Problem: Fluid Volume: Goal: Ability to maintain a balanced intake and output will improve Outcome: Progressing   Problem: Health Behavior/Discharge Planning: Goal: Ability to identify and utilize available resources and services will improve Outcome: Progressing Goal: Ability to manage health-related needs will improve Outcome: Progressing   Problem: Metabolic: Goal: Ability to maintain appropriate glucose levels will improve Outcome: Progressing   Problem: Nutritional: Goal: Maintenance of adequate nutrition will improve Outcome: Progressing Goal: Progress toward achieving an optimal weight will improve Outcome: Progressing   Problem: Skin Integrity: Goal: Risk for impaired skin integrity will decrease Outcome: Progressing   Problem: Tissue Perfusion: Goal: Adequacy of tissue perfusion will improve Outcome: Progressing   Problem: Education: Goal: Knowledge of General Education information will improve Description: Including pain rating scale, medication(s)/side effects and non-pharmacologic comfort measures Outcome: Progressing   Problem: Health Behavior/Discharge Planning: Goal: Ability to manage health-related needs will improve Outcome: Progressing   Problem: Clinical Measurements: Goal: Ability to maintain clinical measurements within normal limits will improve Outcome: Progressing Goal: Will remain free from infection Outcome: Progressing Goal: Diagnostic test results will improve Outcome: Progressing Goal: Respiratory complications will improve Outcome: Progressing Goal: Cardiovascular complication will  be avoided Outcome: Progressing   Problem: Activity: Goal: Risk for activity intolerance will decrease Outcome: Progressing   Problem: Nutrition: Goal: Adequate nutrition will be maintained Outcome: Progressing   Problem: Coping: Goal: Level of anxiety will decrease Outcome: Progressing   Problem: Elimination: Goal: Will not experience complications related to bowel motility Outcome: Progressing Goal: Will not experience complications related to urinary retention Outcome: Progressing   Problem: Pain Managment: Goal: General experience of comfort will improve Outcome: Progressing   Problem: Safety: Goal: Ability to remain free from injury will improve Outcome: Progressing   Problem: Skin Integrity: Goal: Risk for impaired skin integrity will decrease Outcome: Progressing   Problem: Education: Goal: Knowledge of General Education information will improve Description: Including pain rating scale, medication(s)/side effects and non-pharmacologic comfort measures Outcome: Progressing   Problem: Health Behavior/Discharge Planning: Goal: Ability to manage health-related needs will improve Outcome: Progressing   Problem: Clinical Measurements: Goal: Ability to maintain clinical measurements within normal limits will improve Outcome: Progressing Goal: Will remain free from infection Outcome: Progressing Goal: Diagnostic test results will improve Outcome: Progressing Goal: Respiratory complications will improve Outcome: Progressing Goal: Cardiovascular complication will be avoided Outcome: Progressing   Problem: Activity: Goal: Risk for activity intolerance will decrease Outcome: Progressing   Problem: Nutrition: Goal: Adequate nutrition will be maintained Outcome: Progressing   Problem: Coping: Goal: Level of anxiety will decrease Outcome: Progressing   Problem: Elimination: Goal: Will not experience complications related to bowel motility Outcome:  Progressing Goal: Will not experience complications related to urinary retention Outcome: Progressing   Problem: Pain Managment: Goal: General experience of comfort will improve Outcome: Progressing   Problem: Safety: Goal: Ability to remain free from injury will improve Outcome: Progressing   Problem: Skin Integrity: Goal: Risk for impaired skin integrity will decrease Outcome: Progressing   Problem: Education: Goal: Knowledge of the prescribed therapeutic regimen will improve Outcome: Progressing Goal: Ability to verbalize activity precautions or restrictions will improve Outcome: Progressing Goal: Understanding of discharge needs will improve Outcome: Progressing   Problem: Activity: Goal: Ability  to perform//tolerate increased activity and mobilize with assistive devices will improve Outcome: Progressing

## 2023-06-04 NOTE — Progress Notes (Signed)
Representative [name] from social services came to speak with patient 06/04/23 around 1930. Can be reached at [phone]. Stated that patient told her that he was willing to accept rehab now.
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
## 2023-06-05 LAB — GLUCOSE, CAPILLARY
Glucose-Capillary: 205 mg/dL — ABNORMAL HIGH (ref 70–99)
Glucose-Capillary: 104 mg/dL — ABNORMAL HIGH (ref 70–99)
Glucose-Capillary: 117 mg/dL — ABNORMAL HIGH (ref 70–99)
Glucose-Capillary: 131 mg/dL — ABNORMAL HIGH (ref 70–99)

## 2023-06-05 MED ORDER — TAB-A-VITE/IRON PO TABS
1.0000 | ORAL_TABLET | Freq: Every day | ORAL | Status: DC
  Administered 2023-06-05 – 2023-09-09 (×96): 1 via ORAL
  Filled 2023-06-05 (×97): qty 1

## 2023-06-05 MED ORDER — ENSURE ENLIVE PO LIQD
237.0000 mL | Freq: Two times a day (BID) | ORAL | Status: DC
  Administered 2023-06-06 – 2023-07-25 (×94): 237 mL via ORAL
  Filled 2023-06-05: qty 237

## 2023-06-05 NOTE — Progress Notes (Signed)
Initial Nutrition Assessment  DOCUMENTATION CODES:   Not applicable  INTERVENTION:  - Carb Modified diet.   - Ensure Plus High Protein po BID, each supplement provides 350 kcal and 20 grams of protein. - 1 packet Juven BID, each packet provides 95 calories, 2.5 grams of protein (collagen), and 9.8 grams of carbohydrate (3 grams sugar); also contains 7 grams of L-arginine and L-glutamine, 300 mg vitamin C, 15 mg vitamin E, 1.2 mcg vitamin B-12, 9.5 mg zinc, 200 mg calcium, and 1.5 g  Calcium Beta-hydroxy-Beta-methylbutyrate to support wound healing  - Continue Multivitamin with minerals daily, 500mg  vitamin C BID, and 220mg  zinc x14 days to support wound healing.   - Monitor weight trends.   - New weight ordered.   NUTRITION DIAGNOSIS:   Increased nutrient needs related to acute illness (L foot gangrene with osteomyelitis; wound vac) as evidenced by estimated needs.  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 He was found to have L foot gangrene with osteomyelitis.   7/24 Admit 7/31 s/p Left TMA with wound vac placement  Attempted to speak with patient via bedside telephone but no answer.   Per chart review, patient has had a 10# or 8% weight loss in the past ~3 months, which is significant for the time frame. However, no weight taken since admission 10 days ago to assess changes since that time. Will order new weight.   Patient documented to be consuming 75-100% of meals with average of 97% over the past week. Ordered Juven by Dr Lajoyce Corners and per [facility], patient has been consuming  BID. Patient would also benefit from protein supplementation so will add Ensure BID as well and continue vitamin supplementation to further support wound healing.    Medications reviewed and include: vitamin C, MVI, 220 mg zinc x14 days, Colace, Insulin  Labs reviewed:  No BMP since yesterday HA1C 5.8 Blood Glucose 104-205 x24 hours   NUTRITION - FOCUSED PHYSICAL EXAM:  RD working remotely  Diet Order:   Diet Order             Diet Carb Modified Fluid consistency: Thin; Room service appropriate? Yes  Diet effective now                   EDUCATION NEEDS:  Not appropriate for education at this time  Skin:  Skin Assessment: Skin Integrity Issues: Skin Integrity Issues:: Incisions Incisions: Left foot  Last BM:  8/2  Height:  Ht Readings from Last 1 Encounters:  03/11/23 5\' 2"  (1.575 m)   Weight:  Wt Readings from Last 1 Encounters:  05/26/23 54.6 kg    BMI:  Body mass index is 22.02 kg/m.  Estimated Nutritional Needs:  Kcal:  1750-1950 kcals Protein:  80-100 grams Fluid:  >/= 1.7L    Shelle Iron RD, LDN For contact information, refer to [facility].

## 2023-06-05 NOTE — Progress Notes (Signed)
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 He was found to have L foot gangrene with osteomyelitis. Dr. Lajoyce Corners was consulted and he is scheduled for TMA on 7/28. He is on Rocephin and Vancomycin. Dr. Edilia Bo consulted, believes he has adequate circulation for surgery and recommended reconsulting if signs of poor circulation during surgery.  Underwent left TMA with wound VAC placement by Dr. Lajoyce Corners on 7/31.  He will be TDWB per ortho.  Therapy recommended home health but no safe disposition.  Per sister's report to [facility], "he was staying with some people because he couldn't stay with sister anymore due to violent behaviors. Sister was bringing food and they were letting him stay for free. And now those people are saying he can't come back and sister can't take him either".  TOC filed APS report.   Subjective: Seen and examined earlier this morning.  No major events overnight of this morning.  No complaints.  Asking for more coffee.  Objective: Vitals:   06/04/23 0811 06/04/23 1500 06/04/23 1939 06/05/23 0807  BP: 104/66 106/60 115/73 128/71  Pulse: 85 87 81 90  Resp: 18 17 17    Temp: 98.1 F (36.7 C) 98 F (36.7 C) 98 F (36.7 C) 98.3 F (36.8 C)  TempSrc: Oral Oral  Oral  SpO2: 98% 98% 99% 98%  Weight:        Examination:  GENERAL: No apparent distress.  Nontoxic. HEENT: MMM.  Vision and hearing grossly intact.  NECK: Supple.  No apparent JVD.  RESP:  No IWOB.  Fair aeration bilaterally. CVS:  RRR. Heart sounds normal.  ABD/GI/GU: BS+. Abd soft, NTND.   MSK/EXT:  Moves extremities. S/p left TMA.  Wound VAC in place.  Small serosanguineous fluid in canister. SKIN: As above. NEURO: Awake and alert.  Fairly oriented.  Follows commands.  Poor insight.  No apparent focal neuro deficit. PSYCH: Calm. Normal affect.   Procedures:  7/31-left TMA by Dr. Lajoyce Corners  Microbiology summarized: COVID-19, influenza and RSV PCR nonreactive Blood cultures NGTD  Assessment and plan: Principal Problem:   Cellulitis of left foot Active Problems:   Essential hypertension   Type 2 diabetes mellitus with peripheral neuropathy (HCC)   Seizure disorder (HCC)   MDD (major depressive disorder), recurrent episode, moderate (HCC)   History of traumatic brain injury   Cutaneous abscess of left foot   PAD (peripheral artery disease) (HCC)   Severe protein-calorie malnutrition (HCC)  Left foot cellulitis with gangrene/osteomyelitis -MRI with osteo of the 2nd toe and plantar muscles suggesting myositis -Vascular has consulted and there is no indication for vascular intervention at this time -S/p left TMA and wound VAC placement by Dr. Lajoyce Corners on 7/31. -Per Ortho, surgical source control achieved.  Antibiotic discontinued. -TDWB on the left -Pain control and VTE prophylaxis per surgery -PT/OT-recommended home health and DME but no safe disposition.   H/o TBI/cognitive impairment -Continue Prozac, hydroxyzine, and Risperdal   Unsafe living situation: Sister says living in a [place], unsafe living situation.  Patient with TBI/cognitive impairment.  Has no capacity to make medical  decision. -TOC filed APS report.   Controlled DM-2 with hyperglycemia: A1c 5.8%. Recent Labs  Lab 06/04/23 1648 06/04/23 1940 06/04/23 2213 06/05/23 0750 06/05/23 1117  GLUCAP 107* 137* 169* 205* 104*  -Continue current insulin regimen  Normocytic anemia: H&H stable.  Anemia panel suggests anemia of chronic disease. -Continue monitoring   Seizure disorder  -Continue Keppra     HLD -Continue atorvastatin   HTN -Continue lisinopril  Hyponatremia: Resolved.  Increased nutrient needs/surgical wound Body mass index is 22.02 kg/m. -Start multivitamin -Consult dietitian         DVT prophylaxis:  SCD's Start: 06/02/23 1515 enoxaparin (LOVENOX) injection 40 mg Start: 05/27/23 1000  Code Status: Full code Family Communication: None at bedside Level of care: Med-Surg Status is: Inpatient Remains inpatient appropriate because: Lack of safe disposition.   Final disposition: No safe disposition  Consultants:  Orthopedic surgery  35 minutes with more than 50% spent in reviewing records, counseling patient/family and coordinating care.   Sch Meds:  Scheduled Meds:  vitamin C  1,000 mg Oral Daily   atorvastatin  10 mg Oral Daily   docusate sodium  100 mg Oral Daily   enoxaparin (LOVENOX) injection  40 mg Subcutaneous Daily   FLUoxetine  30 mg Oral Daily   insulin aspart  0-5 Units Subcutaneous QHS   insulin aspart  0-6 Units Subcutaneous TID WC   insulin glargine-yfgn  10 Units Subcutaneous QHS   levETIRAcetam  500 mg Oral BID   lisinopril  2.5 mg Oral Daily   multivitamins with iron  1 tablet Oral Daily   nutrition supplement (JUVEN)  1 packet Oral BID BM   pantoprazole  40 mg Oral Daily   risperiDONE  0.25 mg Oral QHS   zinc sulfate  220 mg Oral Daily   Continuous Infusions:   PRN Meds:.acetaminophen **OR** acetaminophen, alum & mag hydroxide-simeth, bisacodyl, guaiFENesin-dextromethorphan, hydrALAZINE, HYDROmorphone (DILAUDID) injection, labetalol, magnesium citrate, metoprolol tartrate, ondansetron **OR** ondansetron (ZOFRAN) IV, oxyCODONE, oxyCODONE, phenol, polyethylene glycol, senna-docusate  Antimicrobials: Anti-infectives (From admission, onward)    Start     Dose/Rate Route Frequency Ordered Stop   06/03/23 0600  ceFAZolin (ANCEF) IVPB 2g/100 mL premix        2 g 200 mL/hr over 30 Minutes Intravenous On call to O.R. 06/02/23 1149  06/02/23 1330   06/02/23 2200  ceFAZolin (ANCEF) IVPB 2g/100 mL premix        2 g 200 mL/hr over 30 Minutes Intravenous Every 8 hours 06/02/23 1514 06/03/23 0639   06/02/23 1142  ceFAZolin (ANCEF) 2-4 GM/100ML-% IVPB       Note to Pharmacy: [facility], [name]: cabinet override      06/02/23 1142 06/02/23 1330   06/01/23 1715  metroNIDAZOLE (FLAGYL) IVPB 500 mg  Status:  Discontinued        500 mg 100 mL/hr over 60 Minutes Intravenous Every 12 hours 06/01/23 1615 06/02/23 1515   05/30/23 0800  ceFAZolin (ANCEF) IVPB 2g/100 mL premix        2 g 200 mL/hr over 30 Minutes Intravenous On call to O.R. 05/30/23 0412 05/30/23 1209   05/28/23 1500  vancomycin (VANCOREADY) IVPB 1250 mg/250 mL  Status:  Discontinued        1,250 mg 166.7 mL/hr over 90 Minutes Intravenous Every 24 hours 05/28/23 1211 06/02/23 1515   05/28/23 1145  vancomycin (VANCOREADY) IVPB 1250 mg/250 mL  Status:  Discontinued        1,250 mg 166.7 mL/hr over 90 Minutes Intravenous Every  24 hours 05/28/23 1051 05/28/23 1211   05/26/23 2030  cefTRIAXone (ROCEPHIN) 2 g in sodium chloride 0.9 % 100 mL IVPB        2 g 200 mL/hr over 30 Minutes Intravenous Every 24 hours 05/26/23 2026 06/01/23 1845        I have personally reviewed the following labs and images: CBC: Recent Labs  Lab 05/30/23 0229 06/02/23 0112 06/03/23 0902 06/04/23 0333 06/05/23 0137  WBC 8.8 9.3 8.5 7.3 5.2  NEUTROABS 6.9 7.4  --   --   --   HGB 8.1* 8.0* 8.8* 7.8* 7.7*  HCT 23.8* 23.9* 26.6* 23.6* 23.3*  MCV 86.5 85.1 86.4 86.8 86.6  PLT 341 360 405* 360 322   BMP &GFR Recent Labs  Lab 05/30/23 0229 06/02/23 0112 06/03/23 0902 06/04/23 0333  NA 134* 133* 131* 135  K 4.2 4.5 4.2 4.5  CL 99 98 97* 100  CO2 29 28 25 27   GLUCOSE 167* 117* 145* 143*  BUN 15 17 24* 27*  CREATININE 0.78 0.77 0.78 1.03  CALCIUM 7.9* 8.3* 8.3* 8.4*  MG  --  1.8 1.8 1.8  PHOS  --  3.1 3.5 3.2   Estimated Creatinine Clearance: 61.1 mL/min (by C-G formula based on  SCr of 1.03 mg/dL). Liver & Pancreas: Recent Labs  Lab 06/02/23 0112 06/03/23 0902 06/04/23 0333  ALBUMIN 2.0* 2.2* 2.0*   No results for input(s): "LIPASE", "AMYLASE" in the last 168 hours. No results for input(s): "AMMONIA" in the last 168 hours. Diabetic: No results for input(s): "HGBA1C" in the last 72 hours. Recent Labs  Lab 06/04/23 1648 06/04/23 1940 06/04/23 2213 06/05/23 0750 06/05/23 1117  GLUCAP 107* 137* 169* 205* 104*   Cardiac Enzymes: No results for input(s): "CKTOTAL", "CKMB", "CKMBINDEX", "TROPONINI" in the last 168 hours. No results for input(s): "PROBNP" in the last 8760 hours. Coagulation Profile: No results for input(s): "INR", "PROTIME" in the last 168 hours.  Thyroid Function Tests: No results for input(s): "TSH", "T4TOTAL", "FREET4", "T3FREE", "THYROIDAB" in the last 72 hours. Lipid Profile: No results for input(s): "CHOL", "HDL", "LDLCALC", "TRIG", "CHOLHDL", "LDLDIRECT" in the last 72 hours. Anemia Panel: Recent Labs    06/03/23 0902 06/04/23 0817  VITAMINB12  --  704  FOLATE 5.6*  --   FERRITIN  --  171  TIBC  --  245*  IRON  --  32*  RETICCTPCT 2.7 2.6   Urine analysis:    Component Value Date/Time   COLORURINE AMBER (A) 05/26/2023 2026   APPEARANCEUR CLEAR 05/26/2023 2026   APPEARANCEUR Clear 01/23/2021 1634   LABSPEC 1.020 05/26/2023 2026   PHURINE 5.0 05/26/2023 2026   GLUCOSEU NEGATIVE 05/26/2023 2026   GLUCOSEU NEGATIVE 10/01/2020 0853   HGBUR NEGATIVE 05/26/2023 2026   BILIRUBINUR NEGATIVE 05/26/2023 2026   BILIRUBINUR Negative 01/23/2021 1634   KETONESUR 5 (A) 05/26/2023 2026   PROTEINUR 100 (A) 05/26/2023 2026   UROBILINOGEN 0.2 10/01/2020 0853   NITRITE NEGATIVE 05/26/2023 2026   LEUKOCYTESUR NEGATIVE 05/26/2023 2026   Sepsis Labs: Invalid input(s): "PROCALCITONIN", "LACTICIDVEN"  Microbiology: Recent Results (from the past 240 hour(s))  Blood Culture (routine x 2)     Status: None   Collection Time: 05/26/23   8:23 PM   Specimen: BLOOD RIGHT FOREARM  Result Value Ref Range Status   Specimen Description BLOOD RIGHT FOREARM  Final   Special Requests   Final    BOTTLES DRAWN AEROBIC AND ANAEROBIC Blood Culture adequate volume   Culture  Final    NO GROWTH 5 DAYS Performed at [facility], [address] [place], [place] [address]    Report Status 05/31/2023 FINAL  Final  Resp panel by RT-PCR (RSV, Flu A&B, Covid) Anterior Nasal Swab     Status: None   Collection Time: 05/26/23  8:26 PM   Specimen: Anterior Nasal Swab  Result Value Ref Range Status   SARS Coronavirus 2 by RT PCR NEGATIVE NEGATIVE Final   Influenza A by PCR NEGATIVE NEGATIVE Final   Influenza B by PCR NEGATIVE NEGATIVE Final    Comment: (NOTE) The Xpert Xpress SARS-CoV-2/FLU/RSV plus assay is intended as an aid in the diagnosis of influenza from Nasopharyngeal swab specimens and should not be used as a sole basis for treatment. Nasal washings and aspirates are unacceptable for Xpert Xpress SARS-CoV-2/FLU/RSV testing.  Fact Sheet for Patients: BloggerCourse.com  Fact Sheet for Healthcare Providers: SeriousBroker.it  This test is not yet approved or cleared by the [place] FDA and has been authorized for detection and/or diagnosis of SARS-CoV-2 by FDA under an Emergency Use Authorization (EUA). This EUA will remain in effect (meaning this test can be used) for the duration of the COVID-19 declaration under Section 564(b)(1) of the Act, 21 U.S.C. section 360bbb-3(b)(1), unless the authorization is terminated or revoked.     Resp Syncytial Virus by PCR NEGATIVE NEGATIVE Final    Comment: (NOTE) Fact Sheet for Patients: BloggerCourse.com  Fact Sheet for Healthcare Providers: SeriousBroker.it  This test is not yet approved or cleared by the [place] FDA and has been authorized for detection and/or  diagnosis of SARS-CoV-2 by FDA under an Emergency Use Authorization (EUA). This EUA will remain in effect (meaning this test can be used) for the duration of the COVID-19 declaration under Section 564(b)(1) of the Act, 21 U.S.C. section 360bbb-3(b)(1), unless the authorization is terminated or revoked.  Performed at [facility], [address] [place], [place] [address]   Blood Culture (routine x 2)     Status: None   Collection Time: 05/26/23  8:31 PM   Specimen: BLOOD  Result Value Ref Range Status   Specimen Description BLOOD RIGHT ANTECUBITAL  Final   Special Requests   Final    BOTTLES DRAWN AEROBIC AND ANAEROBIC Blood Culture adequate volume   Culture   Final    NO GROWTH 5 DAYS Performed at [facility], [address], [place], [place] [address]    Report Status 05/31/2023 FINAL  Final  Surgical pcr screen     Status: Abnormal   Collection Time: 05/27/23  9:11 PM   Specimen: Nasal Mucosa; Nasal Swab  Result Value Ref Range Status   MRSA, PCR NEGATIVE NEGATIVE Final   Staphylococcus aureus POSITIVE (A) NEGATIVE Final    Comment: (NOTE) The Xpert SA Assay (FDA approved for NASAL specimens in patients 23 years of age and older), is one component of a comprehensive surveillance program. It is not intended to diagnose infection nor to guide or monitor treatment. Performed at [facility], [address], [place], [place] [address]     Radiology Studies: No results found.     T.  [facility]  If 7PM-7AM, please contact night-coverage [email or web address] 06/05/2023, 2:31 PM

## 2023-06-06 LAB — GLUCOSE, CAPILLARY
Glucose-Capillary: 184 mg/dL — ABNORMAL HIGH (ref 70–99)
Glucose-Capillary: 127 mg/dL — ABNORMAL HIGH (ref 70–99)
Glucose-Capillary: 169 mg/dL — ABNORMAL HIGH (ref 70–99)

## 2023-06-06 NOTE — Progress Notes (Signed)
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 He was found to have L foot gangrene with osteomyelitis. Dr. Lajoyce Corners was consulted and he is scheduled for TMA on 7/28. He is on Rocephin and Vancomycin. Dr. Edilia Bo consulted, believes he has adequate circulation for surgery and recommended reconsulting if signs of poor circulation during surgery.  Underwent left TMA with wound VAC placement by Dr. Lajoyce Corners on 7/31.  He will be TDWB per ortho.  Therapy recommended home health but no safe disposition.  Per sister's report to [facility], "he was staying with some people because he couldn't stay with sister anymore due to violent behaviors. Sister was bringing food and they were letting him stay for free. And now those people are saying he can't come back and sister can't take him either".  TOC filed APS report.   Subjective: Seen and examined earlier this morning.  No major events overnight of this morning.  No complaints.  Objective: Vitals:   06/05/23 1447 06/05/23 2002 06/06/23 0430 06/06/23 0818  BP: 100/76 119/78 131/80 113/74  Pulse: 96 90 79 82  Resp:  16 16 18   Temp: 98.4 F (36.9 C) 98.2 F (36.8 C)  98 F (36.7 C)  TempSrc: Oral Oral    SpO2: 99% 100% 100% 100%  Weight:        Examination:  GENERAL: No apparent distress.  Nontoxic. HEENT: MMM.  Vision and hearing grossly intact.  NECK: Supple.  No apparent JVD.  RESP:  No IWOB.  Fair aeration bilaterally. CVS:  RRR. Heart sounds normal.  ABD/GI/GU: BS+. Abd soft, NTND.  MSK/EXT:  Moves extremities. S/p left  TMA.  Wound VAC in place.  Small serosanguineous fluid in canister. SKIN: As above. NEURO: Awake and alert.  Fairly oriented.  Follows commands.  Poor insight.  No apparent focal neuro deficit. PSYCH: Calm. Normal affect.   Procedures:  7/31-left TMA by Dr. Lajoyce Corners  Microbiology summarized: COVID-19, influenza and RSV PCR nonreactive Blood cultures NGTD  Assessment and plan: Principal Problem:   Cellulitis of left foot Active Problems:   Essential hypertension   Type 2 diabetes mellitus with peripheral neuropathy (HCC)   Seizure disorder (HCC)   MDD (major depressive disorder), recurrent episode, moderate (HCC)   History of traumatic brain injury   Cutaneous abscess of left foot   PAD (peripheral artery disease) (HCC)   Severe protein-calorie malnutrition (HCC)  Left foot cellulitis with gangrene/osteomyelitis -MRI with osteo of the 2nd toe and plantar muscles suggesting myositis -Vascular has consulted and there is no indication for vascular intervention at this time -S/p left TMA and wound VAC placement by Dr. Lajoyce Corners on 7/31. -Per Ortho, surgical source control achieved.  Antibiotic discontinued. -TDWB on the left -Pain control and VTE prophylaxis per surgery -PT/OT-recommended home health and DME but no safe disposition.   H/o TBI/cognitive impairment -Continue Prozac, hydroxyzine, and Risperdal   Unsafe living situation: Sister says living in a [place], unsafe living situation.  Patient with TBI/cognitive impairment.  Has no capacity to make medical decision. -TOC filed APS report.   Controlled  DM-2 with hyperglycemia: A1c 5.8%. Recent Labs  Lab 06/05/23 1117 06/05/23 1610 06/05/23 2150 06/06/23 0818 06/06/23 1145  GLUCAP 104* 117* 131* 184* 127*  -Continue current insulin regimen  Normocytic anemia: H&H stable.  Anemia panel suggests anemia of chronic disease. -Continue monitoring   Seizure disorder  -Continue Keppra    HLD -Continue atorvastatin    HTN -Continue lisinopril  Hyponatremia: Resolved.  Increased nutrient needs/surgical wound Body mass index is 22.02 kg/m. -Start multivitamin -Consult dietitian Nutrition Problem: Increased nutrient needs Etiology: acute illness (L foot gangrene with osteomyelitis; wound vac) Signs/Symptoms: estimated needs Interventions: Refer to RD note for recommendations, Ensure Enlive (each supplement provides 350kcal and 20 grams of protein), Juven, MVI   DVT prophylaxis:  SCD's Start: 06/02/23 1515 enoxaparin (LOVENOX) injection 40 mg Start: 05/27/23 1000  Code Status: Full code Family Communication: None at bedside Level of care: Med-Surg Status is: Inpatient Remains inpatient appropriate because: Lack of safe disposition.   Final disposition: No safe disposition  Consultants:  Orthopedic surgery  25 minutes with more than 50% spent in reviewing records, counseling patient/family and coordinating care.   Sch Meds:  Scheduled Meds:  vitamin C  1,000 mg Oral Daily   atorvastatin  10 mg Oral Daily   docusate sodium  100 mg Oral Daily   enoxaparin (LOVENOX) injection  40 mg Subcutaneous Daily   feeding supplement  237 mL Oral BID BM   FLUoxetine  30 mg Oral Daily   insulin aspart  0-5 Units Subcutaneous QHS   insulin aspart  0-6 Units Subcutaneous TID WC   insulin glargine-yfgn  10 Units Subcutaneous QHS   levETIRAcetam  500 mg Oral BID   lisinopril  2.5 mg Oral Daily   multivitamins with iron  1 tablet Oral Daily   nutrition supplement (JUVEN)  1 packet Oral BID BM   pantoprazole  40 mg Oral Daily   risperiDONE  0.25 mg Oral QHS   zinc sulfate  220 mg Oral Daily   Continuous Infusions:   PRN Meds:.acetaminophen **OR** acetaminophen, alum & mag hydroxide-simeth, bisacodyl, guaiFENesin-dextromethorphan, hydrALAZINE, HYDROmorphone (DILAUDID) injection, labetalol, magnesium citrate, metoprolol tartrate, ondansetron **OR** ondansetron (ZOFRAN) IV, oxyCODONE, oxyCODONE, phenol,  polyethylene glycol, senna-docusate  Antimicrobials: Anti-infectives (From admission, onward)    Start     Dose/Rate Route Frequency Ordered Stop   06/03/23 0600  ceFAZolin (ANCEF) IVPB 2g/100 mL premix        2 g 200 mL/hr over 30 Minutes Intravenous On call to O.R. 06/02/23 1149 06/02/23 1330   06/02/23 2200  ceFAZolin (ANCEF) IVPB 2g/100 mL premix        2 g 200 mL/hr over 30 Minutes Intravenous Every 8 hours 06/02/23 1514 06/03/23 0639   06/02/23 1142  ceFAZolin (ANCEF) 2-4 GM/100ML-% IVPB       Note to Pharmacy: [facility], [name]: cabinet override      06/02/23 1142 06/02/23 1330   06/01/23 1715  metroNIDAZOLE (FLAGYL) IVPB 500 mg  Status:  Discontinued        500 mg 100 mL/hr over 60 Minutes Intravenous Every 12 hours 06/01/23 1615 06/02/23 1515   05/30/23 0800  ceFAZolin (ANCEF) IVPB 2g/100 mL premix        2 g 200 mL/hr over 30 Minutes Intravenous On call to O.R. 05/30/23 0412 05/30/23 1209   05/28/23 1500  vancomycin (VANCOREADY) IVPB 1250 mg/250 mL  Status:  Discontinued        1,250 mg 166.7 mL/hr over 90 Minutes Intravenous Every 24 hours 05/28/23  1211 06/02/23 1515   05/28/23 1145  vancomycin (VANCOREADY) IVPB 1250 mg/250 mL  Status:  Discontinued        1,250 mg 166.7 mL/hr over 90 Minutes Intravenous Every 24 hours 05/28/23 1051 05/28/23 1211   05/26/23 2030  cefTRIAXone (ROCEPHIN) 2 g in sodium chloride 0.9 % 100 mL IVPB        2 g 200 mL/hr over 30 Minutes Intravenous Every 24 hours 05/26/23 2026 06/01/23 1845        I have personally reviewed the following labs and images: CBC: Recent Labs  Lab 06/02/23 0112 06/03/23 0902 06/04/23 0333 06/05/23 0137  WBC 9.3 8.5 7.3 5.2  NEUTROABS 7.4  --   --   --   HGB 8.0* 8.8* 7.8* 7.7*  HCT 23.9* 26.6* 23.6* 23.3*  MCV 85.1 86.4 86.8 86.6  PLT 360 405* 360 322   BMP &GFR Recent Labs  Lab 06/02/23 0112 06/03/23 0902 06/04/23 0333  NA 133* 131* 135  K 4.5 4.2 4.5  CL 98 97* 100  CO2 28 25 27   GLUCOSE 117*  145* 143*  BUN 17 24* 27*  CREATININE 0.77 0.78 1.03  CALCIUM 8.3* 8.3* 8.4*  MG 1.8 1.8 1.8  PHOS 3.1 3.5 3.2   Estimated Creatinine Clearance: 61.1 mL/min (by C-G formula based on SCr of 1.03 mg/dL). Liver & Pancreas: Recent Labs  Lab 06/02/23 0112 06/03/23 0902 06/04/23 0333  ALBUMIN 2.0* 2.2* 2.0*   No results for input(s): "LIPASE", "AMYLASE" in the last 168 hours. No results for input(s): "AMMONIA" in the last 168 hours. Diabetic: No results for input(s): "HGBA1C" in the last 72 hours. Recent Labs  Lab 06/05/23 1117 06/05/23 1610 06/05/23 2150 06/06/23 0818 06/06/23 1145  GLUCAP 104* 117* 131* 184* 127*   Cardiac Enzymes: No results for input(s): "CKTOTAL", "CKMB", "CKMBINDEX", "TROPONINI" in the last 168 hours. No results for input(s): "PROBNP" in the last 8760 hours. Coagulation Profile: No results for input(s): "INR", "PROTIME" in the last 168 hours.  Thyroid Function Tests: No results for input(s): "TSH", "T4TOTAL", "FREET4", "T3FREE", "THYROIDAB" in the last 72 hours. Lipid Profile: No results for input(s): "CHOL", "HDL", "LDLCALC", "TRIG", "CHOLHDL", "LDLDIRECT" in the last 72 hours. Anemia Panel: Recent Labs    06/04/23 0817  VITAMINB12 704  FERRITIN 171  TIBC 245*  IRON 32*  RETICCTPCT 2.6   Urine analysis:    Component Value Date/Time   COLORURINE AMBER (A) 05/26/2023 2026   APPEARANCEUR CLEAR 05/26/2023 2026   APPEARANCEUR Clear 01/23/2021 1634   LABSPEC 1.020 05/26/2023 2026   PHURINE 5.0 05/26/2023 2026   GLUCOSEU NEGATIVE 05/26/2023 2026   GLUCOSEU NEGATIVE 10/01/2020 0853   HGBUR NEGATIVE 05/26/2023 2026   BILIRUBINUR NEGATIVE 05/26/2023 2026   BILIRUBINUR Negative 01/23/2021 1634   KETONESUR 5 (A) 05/26/2023 2026   PROTEINUR 100 (A) 05/26/2023 2026   UROBILINOGEN 0.2 10/01/2020 0853   NITRITE NEGATIVE 05/26/2023 2026   LEUKOCYTESUR NEGATIVE 05/26/2023 2026   Sepsis Labs: Invalid input(s): "PROCALCITONIN",  "LACTICIDVEN"  Microbiology: Recent Results (from the past 240 hour(s))  Surgical pcr screen     Status: Abnormal   Collection Time: 05/27/23  9:11 PM   Specimen: Nasal Mucosa; Nasal Swab  Result Value Ref Range Status   MRSA, PCR NEGATIVE NEGATIVE Final   Staphylococcus aureus POSITIVE (A) NEGATIVE Final    Comment: (NOTE) The Xpert SA Assay (FDA approved for NASAL specimens in patients 68 years of age and older), is one component of a comprehensive surveillance program.  It is not intended to diagnose infection nor to guide or monitor treatment. Performed at [facility], [address], [place], [place] [address]     Radiology Studies: No results found.     T.  [facility]  If 7PM-7AM, please contact night-coverage [email or web address] 06/06/2023, 3:35 PM

## 2023-06-07 LAB — RENAL FUNCTION PANEL
Albumin: 2.4 g/dL — ABNORMAL LOW (ref 3.5–5.0)
Anion gap: 8 (ref 5–15)
BUN: 44 mg/dL — ABNORMAL HIGH (ref 6–20)
CO2: 28 mmol/L (ref 22–32)
Calcium: 8.3 mg/dL — ABNORMAL LOW (ref 8.9–10.3)
Chloride: 97 mmol/L — ABNORMAL LOW (ref 98–111)
Creatinine, Ser: 0.98 mg/dL (ref 0.61–1.24)
GFR, Estimated: >60 mL/min (ref >60)
Glucose, Bld: 211 mg/dL — ABNORMAL HIGH (ref 70–99)
Phosphorus: 2.7 mg/dL (ref 2.5–4.6)
Potassium: 5 mmol/L (ref 3.5–5.1)
Sodium: 133 mmol/L — ABNORMAL LOW (ref 135–145)

## 2023-06-07 LAB — GLUCOSE, CAPILLARY
Glucose-Capillary: 145 mg/dL — ABNORMAL HIGH (ref 70–99)
Glucose-Capillary: 138 mg/dL — ABNORMAL HIGH (ref 70–99)
Glucose-Capillary: 164 mg/dL — ABNORMAL HIGH (ref 70–99)
Glucose-Capillary: 198 mg/dL — ABNORMAL HIGH (ref 70–99)

## 2023-06-07 NOTE — Plan of Care (Signed)
  Problem: Education: Goal: Ability to describe self-care measures that may prevent or decrease complications (Diabetes Survival Skills Education) will improve Outcome: Progressing   Problem: Skin Integrity: Goal: Risk for impaired skin integrity will decrease Outcome: Progressing   Problem: Pain Managment: Goal: General experience of comfort will improve Outcome: Progressing   Problem: Safety: Goal: Ability to remain free from injury will improve Outcome: Progressing   Problem: Skin Integrity: Goal: Risk for impaired skin integrity will decrease Outcome: Progressing   

## 2023-06-07 NOTE — Progress Notes (Signed)
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 He was found to have L foot gangrene with osteomyelitis. Dr. Lajoyce Corners was consulted and he is scheduled for TMA on 7/28. He is on Rocephin and Vancomycin. Dr. Edilia Bo consulted, believes he has adequate circulation for surgery and recommended reconsulting if signs of poor circulation during surgery.  Underwent left TMA with wound VAC placement by Dr. Lajoyce Corners on 7/31.  He will be TDWB per ortho.  Therapy recommended home health but no safe disposition.  Per sister's report to [facility], "he was staying with some people because he couldn't stay with sister anymore due to violent behaviors. Sister was bringing food and they were letting him stay for free. And now those people are saying he can't come back and sister can't take him either".  TOC filed APS report.   Subjective: Seen and examined earlier this morning.  No major events overnight of this morning.  No complaints.  Objective: Vitals:   06/06/23 1946 06/07/23 0428 06/07/23 0714 06/07/23 0715  BP: 110/70 (!) 84/51 119/71 119/71  Pulse: 80 78 85 85  Resp: 15 15    Temp: 98.6 F (37 C)  98.8 F (37.1 C) 98.8 F (37.1 C)  TempSrc: Oral  Oral Oral  SpO2: 100% 98% 98% 99%  Weight:        Examination:  GENERAL: No apparent distress.  Nontoxic. HEENT: MMM.  Vision and hearing grossly intact.  NECK: Supple.  No apparent JVD.  RESP:  No IWOB.  Fair aeration bilaterally. CVS:  RRR. Heart sounds normal.  ABD/GI/GU: BS+. Abd soft, NTND.  MSK/EXT:  Moves extremities. S/p left  TMA.  Wound VAC in place.  Small serosanguineous fluid in canister. SKIN: As above. NEURO: Awake and alert.  Fairly oriented.  Follows commands.  Poor insight.  No apparent focal neuro deficit. PSYCH: Calm. Normal affect.   Procedures:  7/31-left TMA by Dr. Lajoyce Corners  Microbiology summarized: COVID-19, influenza and RSV PCR nonreactive Blood cultures NGTD  Assessment and plan: Principal Problem:   Cellulitis of left foot Active Problems:   Essential hypertension   Type 2 diabetes mellitus with peripheral neuropathy (HCC)   Seizure disorder (HCC)   MDD (major depressive disorder), recurrent episode, moderate (HCC)   History of traumatic brain injury   Cutaneous abscess of left foot   PAD (peripheral artery disease) (HCC)   Severe protein-calorie malnutrition (HCC)  Left foot cellulitis with gangrene/osteomyelitis -MRI with osteo of the 2nd toe and plantar muscles suggesting myositis -Vascular has consulted and there is no indication for vascular intervention at this time -S/p left TMA and wound VAC placement by Dr. Lajoyce Corners on 7/31. -Per Ortho, surgical source control achieved.  Antibiotic discontinued. -TDWB on the left -Pain control and VTE prophylaxis per surgery -PT/OT-recommended home health and DME but no safe disposition.   H/o TBI/cognitive impairment -Continue Prozac, hydroxyzine, and Risperdal   Unsafe living situation: Sister says living in a [place], unsafe living situation.  Patient with TBI/cognitive impairment.  Has no capacity to make medical decision. -TOC filed APS report.  Controlled DM-2 with hyperglycemia: A1c 5.8%. Recent Labs  Lab 06/06/23 0818 06/06/23 1145 06/06/23 2124 06/07/23 0716 06/07/23 1143  GLUCAP 184* 127* 169* 145* 138*  -Continue current insulin regimen  Normocytic anemia: H&H stable.  Anemia panel suggests anemia of chronic disease. Recent Labs    05/26/23 2023 05/27/23 0659 05/28/23 0208 05/29/23 0408 05/30/23 0229 06/02/23 0112  06/03/23 0902 06/04/23 0333 06/05/23 0137 06/07/23 0105  HGB 9.1* 8.9* 8.6* 8.3* 8.1* 8.0* 8.8* 7.8* 7.7* 8.2*  -Continue monitoring   Seizure disorder  -Continue Keppra    HLD -Continue atorvastatin   HTN -Continue lisinopril  Hyponatremia: Resolved.  Increased nutrient needs/surgical wound Body mass index is 22.02 kg/m. Nutrition Problem: Increased nutrient needs Etiology: acute illness (L foot gangrene with osteomyelitis; wound vac) Signs/Symptoms: estimated needs Interventions: Refer to RD note for recommendations, Ensure Enlive (each supplement provides 350kcal and 20 grams of protein), Juven, MVI   DVT prophylaxis:  SCD's Start: 06/02/23 1515 enoxaparin (LOVENOX) injection 40 mg Start: 05/27/23 1000  Code Status: Full code Family Communication: None at bedside Level of care: Med-Surg Status is: Inpatient Remains inpatient appropriate because: Lack of safe disposition.   Final disposition: No safe disposition  Consultants:  Orthopedic surgery  25 minutes with more than 50% spent in reviewing records, counseling patient/family and coordinating care.   Sch Meds:  Scheduled Meds:  vitamin C  1,000 mg Oral Daily   atorvastatin  10 mg Oral Daily   docusate sodium  100 mg Oral Daily   enoxaparin (LOVENOX) injection  40 mg Subcutaneous Daily   feeding supplement  237 mL Oral BID BM   FLUoxetine  30 mg Oral Daily   insulin aspart  0-5 Units Subcutaneous QHS   insulin aspart  0-6 Units Subcutaneous TID WC   insulin glargine-yfgn  10 Units Subcutaneous QHS   levETIRAcetam  500 mg Oral BID   lisinopril  2.5 mg Oral Daily   multivitamins with iron  1 tablet Oral Daily   nutrition supplement (JUVEN)  1 packet Oral BID BM   pantoprazole  40 mg Oral Daily   risperiDONE  0.25 mg Oral QHS   zinc sulfate  220 mg Oral Daily   Continuous Infusions:   PRN Meds:.acetaminophen **OR** acetaminophen, alum & mag hydroxide-simeth, bisacodyl, guaiFENesin-dextromethorphan,  hydrALAZINE, HYDROmorphone (DILAUDID) injection, labetalol, magnesium citrate, metoprolol tartrate, ondansetron **OR** ondansetron (ZOFRAN) IV, oxyCODONE, oxyCODONE, phenol, polyethylene glycol, senna-docusate  Antimicrobials: Anti-infectives (From admission, onward)    Start     Dose/Rate Route Frequency Ordered Stop   06/03/23 0600  ceFAZolin (ANCEF) IVPB 2g/100 mL premix        2 g 200 mL/hr over 30 Minutes Intravenous On call to O.R. 06/02/23 1149 06/02/23 1330   06/02/23 2200  ceFAZolin (ANCEF) IVPB 2g/100 mL premix        2 g 200 mL/hr over 30 Minutes Intravenous Every 8 hours 06/02/23 1514 06/03/23 0639   06/02/23 1142  ceFAZolin (ANCEF) 2-4 GM/100ML-% IVPB       Note to Pharmacy: [facility], [name]: cabinet override      06/02/23 1142 06/02/23 1330   06/01/23 1715  metroNIDAZOLE (FLAGYL) IVPB 500 mg  Status:  Discontinued        500 mg 100 mL/hr over 60 Minutes Intravenous Every 12 hours 06/01/23 1615 06/02/23 1515   05/30/23 0800  ceFAZolin (ANCEF) IVPB 2g/100 mL premix        2 g 200 mL/hr over 30 Minutes Intravenous On call to O.R. 05/30/23 4098 05/30/23  1209   05/28/23 1500  vancomycin (VANCOREADY) IVPB 1250 mg/250 mL  Status:  Discontinued        1,250 mg 166.7 mL/hr over 90 Minutes Intravenous Every 24 hours 05/28/23 1211 06/02/23 1515   05/28/23 1145  vancomycin (VANCOREADY) IVPB 1250 mg/250 mL  Status:  Discontinued        1,250 mg 166.7 mL/hr over 90 Minutes Intravenous Every 24 hours 05/28/23 1051 05/28/23 1211   05/26/23 2030  cefTRIAXone (ROCEPHIN) 2 g in sodium chloride 0.9 % 100 mL IVPB        2 g 200 mL/hr over 30 Minutes Intravenous Every 24 hours 05/26/23 2026 06/01/23 1845        I have personally reviewed the following labs and images: CBC: Recent Labs  Lab 06/02/23 0112 06/03/23 0902 06/04/23 0333 06/05/23 0137 06/07/23 0105  WBC 9.3 8.5 7.3 5.2 9.0  NEUTROABS 7.4  --   --   --   --   HGB 8.0* 8.8* 7.8* 7.7* 8.2*  HCT 23.9* 26.6* 23.6* 23.3* 24.8*   MCV 85.1 86.4 86.8 86.6 85.5  PLT 360 405* 360 322 382   BMP &GFR Recent Labs  Lab 06/02/23 0112 06/03/23 0902 06/04/23 0333 06/07/23 0105  NA 133* 131* 135 133*  K 4.5 4.2 4.5 5.0  CL 98 97* 100 97*  CO2 28 25 27 28   GLUCOSE 117* 145* 143* 211*  BUN 17 24* 27* 44*  CREATININE 0.77 0.78 1.03 0.98  CALCIUM 8.3* 8.3* 8.4* 8.3*  MG 1.8 1.8 1.8 2.2  PHOS 3.1 3.5 3.2 2.7   Estimated Creatinine Clearance: 64.2 mL/min (by C-G formula based on SCr of 0.98 mg/dL). Liver & Pancreas: Recent Labs  Lab 06/02/23 0112 06/03/23 0902 06/04/23 0333 06/07/23 0105  ALBUMIN 2.0* 2.2* 2.0* 2.4*   No results for input(s): "LIPASE", "AMYLASE" in the last 168 hours. No results for input(s): "AMMONIA" in the last 168 hours. Diabetic: No results for input(s): "HGBA1C" in the last 72 hours. Recent Labs  Lab 06/06/23 0818 06/06/23 1145 06/06/23 2124 06/07/23 0716 06/07/23 1143  GLUCAP 184* 127* 169* 145* 138*   Cardiac Enzymes: No results for input(s): "CKTOTAL", "CKMB", "CKMBINDEX", "TROPONINI" in the last 168 hours. No results for input(s): "PROBNP" in the last 8760 hours. Coagulation Profile: No results for input(s): "INR", "PROTIME" in the last 168 hours.  Thyroid Function Tests: No results for input(s): "TSH", "T4TOTAL", "FREET4", "T3FREE", "THYROIDAB" in the last 72 hours. Lipid Profile: No results for input(s): "CHOL", "HDL", "LDLCALC", "TRIG", "CHOLHDL", "LDLDIRECT" in the last 72 hours. Anemia Panel: No results for input(s): "VITAMINB12", "FOLATE", "FERRITIN", "TIBC", "IRON", "RETICCTPCT" in the last 72 hours.  Urine analysis:    Component Value Date/Time   COLORURINE AMBER (A) 05/26/2023 2026   APPEARANCEUR CLEAR 05/26/2023 2026   APPEARANCEUR Clear 01/23/2021 1634   LABSPEC 1.020 05/26/2023 2026   PHURINE 5.0 05/26/2023 2026   GLUCOSEU NEGATIVE 05/26/2023 2026   GLUCOSEU NEGATIVE 10/01/2020 0853   HGBUR NEGATIVE 05/26/2023 2026   BILIRUBINUR NEGATIVE 05/26/2023  2026   BILIRUBINUR Negative 01/23/2021 1634   KETONESUR 5 (A) 05/26/2023 2026   PROTEINUR 100 (A) 05/26/2023 2026   UROBILINOGEN 0.2 10/01/2020 0853   NITRITE NEGATIVE 05/26/2023 2026   LEUKOCYTESUR NEGATIVE 05/26/2023 2026   Sepsis Labs: Invalid input(s): "PROCALCITONIN", "LACTICIDVEN"  Microbiology: No results found for this or any previous visit (from the past 240 hour(s)).   Radiology Studies: No results found.     T.  [facility]  If  7PM-7AM, please contact night-coverage [email or web address] 06/07/2023, 12:05 PM

## 2023-06-07 NOTE — Progress Notes (Signed)
Mobility Specialist Progress Note   06/07/23 1255  Mobility  Activity Stood at bedside  Level of Assistance Minimal assist, patient does 75% or more  Assistive Device Front wheel walker  Range of Motion/Exercises Active;All extremities  LLE Weight Bearing TDWB  Activity Response Tolerated well   Patient received in supine and agreeable to participate. Deferred to sit in recliner chair, opted to LE exercises while dangling EOB. Completed bed mobility independently and stood x3 total with min A. Patient then performed various LE exercises with supervision and cueing for technique. Tolerated without complaint or incident. Was left in supine with all needs met, call bell in reach.   [name] , BS EXP Mobility Specialist Please contact via SecureChat or Rehab office at [phone]

## 2023-06-07 NOTE — Plan of Care (Signed)
Problem: Education: Goal: Ability to describe self-care measures that may prevent or decrease complications (Diabetes Survival Skills Education) will improve Outcome: Not Progressing Goal: Individualized Educational Video(s) Outcome: Not Progressing   Problem: Coping: Goal: Ability to adjust to condition or change in health will improve Outcome: Not Progressing   Problem: Fluid Volume: Goal: Ability to maintain a balanced intake and output will improve Outcome: Not Progressing   Problem: Health Behavior/Discharge Planning: Goal: Ability to identify and utilize available resources and services will improve Outcome: Not Progressing Goal: Ability to manage health-related needs will improve Outcome: Not Progressing   Problem: Metabolic: Goal: Ability to maintain appropriate glucose levels will improve Outcome: Not Progressing   Problem: Nutritional: Goal: Maintenance of adequate nutrition will improve Outcome: Not Progressing Goal: Progress toward achieving an optimal weight will improve Outcome: Not Progressing   Problem: Skin Integrity: Goal: Risk for impaired skin integrity will decrease Outcome: Not Progressing   Problem: Tissue Perfusion: Goal: Adequacy of tissue perfusion will improve Outcome: Not Progressing   Problem: Education: Goal: Knowledge of General Education information will improve Description: Including pain rating scale, medication(s)/side effects and non-pharmacologic comfort measures Outcome: Not Progressing   Problem: Health Behavior/Discharge Planning: Goal: Ability to manage health-related needs will improve Outcome: Not Progressing   Problem: Clinical Measurements: Goal: Ability to maintain clinical measurements within normal limits will improve Outcome: Not Progressing Goal: Will remain free from infection Outcome: Not Progressing Goal: Diagnostic test results will improve Outcome: Not Progressing Goal: Respiratory complications will  improve Outcome: Not Progressing Goal: Cardiovascular complication will be avoided Outcome: Not Progressing   Problem: Activity: Goal: Risk for activity intolerance will decrease Outcome: Not Progressing   Problem: Nutrition: Goal: Adequate nutrition will be maintained Outcome: Not Progressing   Problem: Coping: Goal: Level of anxiety will decrease Outcome: Not Progressing   Problem: Elimination: Goal: Will not experience complications related to bowel motility Outcome: Not Progressing Goal: Will not experience complications related to urinary retention Outcome: Not Progressing   Problem: Pain Managment: Goal: General experience of comfort will improve Outcome: Not Progressing   Problem: Safety: Goal: Ability to remain free from injury will improve Outcome: Not Progressing   Problem: Skin Integrity: Goal: Risk for impaired skin integrity will decrease Outcome: Not Progressing   Problem: Education: Goal: Knowledge of General Education information will improve Description: Including pain rating scale, medication(s)/side effects and non-pharmacologic comfort measures Outcome: Not Progressing   Problem: Health Behavior/Discharge Planning: Goal: Ability to manage health-related needs will improve Outcome: Not Progressing   Problem: Clinical Measurements: Goal: Ability to maintain clinical measurements within normal limits will improve Outcome: Not Progressing Goal: Will remain free from infection Outcome: Not Progressing Goal: Diagnostic test results will improve Outcome: Not Progressing Goal: Respiratory complications will improve Outcome: Not Progressing Goal: Cardiovascular complication will be avoided Outcome: Not Progressing   Problem: Activity: Goal: Risk for activity intolerance will decrease Outcome: Not Progressing   Problem: Nutrition: Goal: Adequate nutrition will be maintained Outcome: Not Progressing   Problem: Coping: Goal: Level of anxiety  will decrease Outcome: Not Progressing   Problem: Elimination: Goal: Will not experience complications related to bowel motility Outcome: Not Progressing Goal: Will not experience complications related to urinary retention Outcome: Not Progressing   Problem: Pain Managment: Goal: General experience of comfort will improve Outcome: Not Progressing   Problem: Safety: Goal: Ability to remain free from injury will improve Outcome: Not Progressing   Problem: Skin Integrity: Goal: Risk for impaired skin integrity will decrease Outcome: Not Progressing  Problem: Education: Goal: Knowledge of the prescribed therapeutic regimen will improve Outcome: Not Progressing Goal: Ability to verbalize activity precautions or restrictions will improve Outcome: Not Progressing Goal: Understanding of discharge needs will improve Outcome: Not Progressing

## 2023-06-08 LAB — GLUCOSE, CAPILLARY
Glucose-Capillary: 180 mg/dL — ABNORMAL HIGH (ref 70–99)
Glucose-Capillary: 137 mg/dL — ABNORMAL HIGH (ref 70–99)
Glucose-Capillary: 185 mg/dL — ABNORMAL HIGH (ref 70–99)
Glucose-Capillary: 154 mg/dL — ABNORMAL HIGH (ref 70–99)

## 2023-06-08 MED ORDER — INSULIN ASPART 100 UNIT/ML IJ SOLN
0.0000 [IU] | Freq: Three times a day (TID) | INTRAMUSCULAR | Status: DC
  Administered 2023-06-08: 2 [IU] via SUBCUTANEOUS
  Administered 2023-06-08 – 2023-06-10 (×5): 1 [IU] via SUBCUTANEOUS
  Administered 2023-06-10 – 2023-06-11 (×3): 2 [IU] via SUBCUTANEOUS
  Administered 2023-06-12: 1 [IU] via SUBCUTANEOUS
  Administered 2023-06-12 – 2023-06-14 (×5): 2 [IU] via SUBCUTANEOUS
  Administered 2023-06-15 (×3): 1 [IU] via SUBCUTANEOUS
  Administered 2023-06-16 – 2023-06-18 (×5): 2 [IU] via SUBCUTANEOUS
  Administered 2023-06-20: 1 [IU] via SUBCUTANEOUS
  Administered 2023-06-21 (×2): 3 [IU] via SUBCUTANEOUS
  Administered 2023-06-21: 2 [IU] via SUBCUTANEOUS
  Administered 2023-06-22 (×2): 1 [IU] via SUBCUTANEOUS
  Administered 2023-06-23: 2 [IU] via SUBCUTANEOUS
  Administered 2023-06-23 – 2023-06-24 (×3): 1 [IU] via SUBCUTANEOUS
  Administered 2023-06-24: 2 [IU] via SUBCUTANEOUS
  Administered 2023-06-25 – 2023-06-26 (×2): 1 [IU] via SUBCUTANEOUS
  Administered 2023-06-26: 2 [IU] via SUBCUTANEOUS
  Administered 2023-06-27 – 2023-06-29 (×3): 1 [IU] via SUBCUTANEOUS
  Administered 2023-06-30 – 2023-07-01 (×3): 2 [IU] via SUBCUTANEOUS
  Administered 2023-07-02 – 2023-07-07 (×8): 1 [IU] via SUBCUTANEOUS
  Administered 2023-07-07: 2 [IU] via SUBCUTANEOUS
  Administered 2023-07-10: 1 [IU] via SUBCUTANEOUS
  Administered 2023-07-10: 2 [IU] via SUBCUTANEOUS
  Administered 2023-07-11: 1 [IU] via SUBCUTANEOUS
  Administered 2023-07-12: 2 [IU] via SUBCUTANEOUS
  Administered 2023-07-12 – 2023-07-13 (×3): 1 [IU] via SUBCUTANEOUS
  Administered 2023-07-14 – 2023-07-15 (×2): 2 [IU] via SUBCUTANEOUS
  Administered 2023-07-16: 1 [IU] via SUBCUTANEOUS
  Administered 2023-07-16: 2 [IU] via SUBCUTANEOUS
  Administered 2023-07-16: 1 [IU] via SUBCUTANEOUS
  Administered 2023-07-17: 2 [IU] via SUBCUTANEOUS
  Administered 2023-07-18: 1 [IU] via SUBCUTANEOUS
  Administered 2023-07-19: 2 [IU] via SUBCUTANEOUS
  Administered 2023-07-21: 1 [IU] via SUBCUTANEOUS
  Administered 2023-07-21: 3 [IU] via SUBCUTANEOUS
  Administered 2023-07-21 – 2023-07-24 (×5): 1 [IU] via SUBCUTANEOUS
  Administered 2023-07-25: 2 [IU] via SUBCUTANEOUS
  Administered 2023-07-26 (×2): 1 [IU] via SUBCUTANEOUS
  Administered 2023-07-28 (×2): 2 [IU] via SUBCUTANEOUS
  Administered 2023-07-29 (×3): 1 [IU] via SUBCUTANEOUS
  Administered 2023-07-31: 2 [IU] via SUBCUTANEOUS
  Administered 2023-08-01 – 2023-08-06 (×8): 1 [IU] via SUBCUTANEOUS

## 2023-06-08 NOTE — Progress Notes (Signed)
Physical Therapy Treatment Patient Details Name: [name] MRN: [id number] DOB: 27-Nov-1965 Today's Date: 06/08/2023   History of Present Illness Pt is a 57 year old male admitted for evaluation of being unable to get up and ambulate, and not eating for 2 days. 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    PT Comments  Pt with fair tolerance to treatment today. Pt received in recliner requesting to go back to bed. Pt required more assistance to stand and once again ditched RW when transferring to bed requiring Min A for safety. Pt sister was present today and reports that since his TBI he has difficulty using his L side and following directions. Per lengthy conversation with sister, she reports that it is currently unsafe for pt to return to prior living situation and she cannot take him either. Pt did require more assistance today therefore updated DC recs to SNF upon DC. PT will continue to follow.   If plan is discharge home, recommend the following: A little help with walking and/or transfers;A little help with bathing/dressing/bathroom;Assistance with cooking/housework;Direct supervision/assist for medications management;Assist for transportation;Help with stairs or ramp for entrance   Can travel by private vehicle     No  Equipment Recommendations  Other (comment) (Per accepting facility)    Recommendations for Other Services       Precautions / Restrictions Precautions Precautions: Fall Restrictions Weight Bearing Restrictions: Yes LLE Weight Bearing: Touchdown weight bearing     Mobility  Bed Mobility Overal bed mobility: Modified Independent                  Transfers Overall transfer level: Needs assistance Equipment used: Rolling walker (2 wheels) Transfers: Sit to/from Stand, Bed to  chair/wheelchair/BSC Sit to Stand: Min assist   Step pivot transfers: Min assist       General transfer comment: Pt received in recliner requesting to go back to bed. Pt required more assistance to stand and once again ditched RW when transferring to bed requiring Min A for safety. Pt sister was present today and report that since his TBI he has difficulty using his L side and following directions.    Ambulation/Gait                   Stairs             Wheelchair Mobility     Tilt Bed    Modified Rankin (Stroke Patients Only)       Balance Overall balance assessment: Needs assistance Sitting-balance support: No upper extremity supported, Feet supported Sitting balance-Leahy Scale: Normal     Standing balance support: Bilateral upper extremity supported, During functional activity Standing balance-Leahy Scale: Poor Standing balance comment: Reliant on external support.                            Cognition Arousal/Alertness: Awake/alert Behavior During Therapy: Flat affect, WFL for tasks assessed/performed Overall Cognitive Status: History of cognitive impairments - at baseline                                 General Comments: Per chart pt has history of TBI with residual deficits. Difficulty following commands at times.        Exercises      General Comments  General comments (skin integrity, edema, etc.): VSS      Pertinent Vitals/Pain Pain Assessment Pain Assessment: No/denies pain    Home Living                          Prior Function            PT Goals (current goals can now be found in the care plan section) Progress towards PT goals: Progressing toward goals    Frequency    Min 1X/week      PT Plan Discharge plan needs to be updated    Co-evaluation              AM-PAC PT "6 Clicks" Mobility   Outcome Measure  Help needed turning from your back to your side while in a flat  bed without using bedrails?: None Help needed moving from lying on your back to sitting on the side of a flat bed without using bedrails?: None Help needed moving to and from a bed to a chair (including a wheelchair)?: A Little Help needed standing up from a chair using your arms (e.g., wheelchair or bedside chair)?: A Little Help needed to walk in hospital room?: A Lot Help needed climbing 3-5 steps with a railing? : Total 6 Click Score: 17    End of Session Equipment Utilized During Treatment: Gait belt Activity Tolerance: Patient tolerated treatment well;Patient limited by fatigue Patient left: in bed;with call bell/phone within reach;with family/visitor present Nurse Communication: Mobility status PT Visit Diagnosis: Other abnormalities of gait and mobility (R26.89)     Time: 5784-6962 PT Time Calculation (min) (ACUTE ONLY): 24 min  Charges:    $Therapeutic Activity: 23-37 mins PT General Charges $$ ACUTE PT VISIT: 1 Visit                     Shela Nevin, PT, DPT Acute Rehab Services [phone]    [name] 06/08/2023, 3:39 PM

## 2023-06-08 NOTE — Plan of Care (Signed)
Problem: Education: Goal: Ability to describe self-care measures that may prevent or decrease complications (Diabetes Survival Skills Education) will improve Outcome: Not Progressing Goal: Individualized Educational Video(s) Outcome: Not Progressing   Problem: Coping: Goal: Ability to adjust to condition or change in health will improve Outcome: Not Progressing   Problem: Fluid Volume: Goal: Ability to maintain a balanced intake and output will improve Outcome: Not Progressing   Problem: Health Behavior/Discharge Planning: Goal: Ability to identify and utilize available resources and services will improve Outcome: Not Progressing Goal: Ability to manage health-related needs will improve Outcome: Not Progressing   Problem: Metabolic: Goal: Ability to maintain appropriate glucose levels will improve Outcome: Not Progressing   Problem: Nutritional: Goal: Maintenance of adequate nutrition will improve Outcome: Not Progressing Goal: Progress toward achieving an optimal weight will improve Outcome: Not Progressing   Problem: Skin Integrity: Goal: Risk for impaired skin integrity will decrease Outcome: Not Progressing   Problem: Tissue Perfusion: Goal: Adequacy of tissue perfusion will improve Outcome: Not Progressing   Problem: Education: Goal: Knowledge of General Education information will improve Description: Including pain rating scale, medication(s)/side effects and non-pharmacologic comfort measures Outcome: Not Progressing   Problem: Health Behavior/Discharge Planning: Goal: Ability to manage health-related needs will improve Outcome: Not Progressing   Problem: Clinical Measurements: Goal: Ability to maintain clinical measurements within normal limits will improve Outcome: Not Progressing Goal: Will remain free from infection Outcome: Not Progressing Goal: Diagnostic test results will improve Outcome: Not Progressing Goal: Respiratory complications will  improve Outcome: Not Progressing Goal: Cardiovascular complication will be avoided Outcome: Not Progressing   Problem: Activity: Goal: Risk for activity intolerance will decrease Outcome: Not Progressing   Problem: Nutrition: Goal: Adequate nutrition will be maintained Outcome: Not Progressing   Problem: Coping: Goal: Level of anxiety will decrease Outcome: Not Progressing   Problem: Elimination: Goal: Will not experience complications related to bowel motility Outcome: Not Progressing Goal: Will not experience complications related to urinary retention Outcome: Not Progressing   Problem: Pain Managment: Goal: General experience of comfort will improve Outcome: Not Progressing   Problem: Safety: Goal: Ability to remain free from injury will improve Outcome: Not Progressing   Problem: Skin Integrity: Goal: Risk for impaired skin integrity will decrease Outcome: Not Progressing   Problem: Education: Goal: Knowledge of General Education information will improve Description: Including pain rating scale, medication(s)/side effects and non-pharmacologic comfort measures Outcome: Not Progressing   Problem: Health Behavior/Discharge Planning: Goal: Ability to manage health-related needs will improve Outcome: Not Progressing   Problem: Clinical Measurements: Goal: Ability to maintain clinical measurements within normal limits will improve Outcome: Not Progressing Goal: Will remain free from infection Outcome: Not Progressing Goal: Diagnostic test results will improve Outcome: Not Progressing Goal: Respiratory complications will improve Outcome: Not Progressing Goal: Cardiovascular complication will be avoided Outcome: Not Progressing   Problem: Activity: Goal: Risk for activity intolerance will decrease Outcome: Not Progressing   Problem: Nutrition: Goal: Adequate nutrition will be maintained Outcome: Not Progressing   Problem: Coping: Goal: Level of anxiety  will decrease Outcome: Not Progressing   Problem: Elimination: Goal: Will not experience complications related to bowel motility Outcome: Not Progressing Goal: Will not experience complications related to urinary retention Outcome: Not Progressing   Problem: Pain Managment: Goal: General experience of comfort will improve Outcome: Not Progressing   Problem: Safety: Goal: Ability to remain free from injury will improve Outcome: Not Progressing   Problem: Skin Integrity: Goal: Risk for impaired skin integrity will decrease Outcome: Not Progressing  Problem: Education: Goal: Knowledge of the prescribed therapeutic regimen will improve Outcome: Not Progressing Goal: Ability to verbalize activity precautions or restrictions will improve Outcome: Not Progressing Goal: Understanding of discharge needs will improve Outcome: Not Progressing

## 2023-06-08 NOTE — Progress Notes (Signed)
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 He was found to have L foot gangrene with osteomyelitis. Dr. Lajoyce Corners was consulted and he is scheduled for TMA on 7/28. He is on Rocephin and Vancomycin. Dr. Edilia Bo consulted, believes he has adequate circulation for surgery and recommended reconsulting if signs of poor circulation during surgery.  Underwent left TMA with wound VAC placement by Dr. Lajoyce Corners on 7/31.  He will be TDWB per ortho.  Therapy recommended home health but no safe disposition.  Per sister's report to [facility], "he was staying with some people because he couldn't stay with sister anymore due to violent behaviors. Sister was bringing food and they were letting him stay for free. And now those people are saying he can't come back and sister can't take him either".  TOC filed APS report.   Subjective: Seen and examined earlier this morning.  No major events overnight of this morning.  No complaints.  Objective: Vitals:   06/07/23 2006 06/08/23 0445 06/08/23 0713 06/08/23 1320  BP: 96/60 112/75 110/72 106/74  Pulse: 84 82 82 69  Resp: 18 18 16 17   Temp: 98.2 F (36.8 C) 98.1 F (36.7 C) 98.4 F (36.9 C) (!) 97.5 F (36.4 C)  TempSrc: Oral Oral Oral Oral  SpO2: 98% 100% 99% 100%  Weight:        Examination:  GENERAL: No apparent distress.  Nontoxic. HEENT: MMM.  Vision and hearing grossly intact.  NECK: Supple.  No apparent JVD.  RESP:  No IWOB.  Fair aeration bilaterally. CVS:  RRR. Heart sounds normal.  ABD/GI/GU: BS+. Abd soft, NTND.  MSK/EXT:   Moves extremities. S/p left TMA.  Wound VAC in place.  Small serosanguineous fluid in canister. SKIN: As above. NEURO: Awake and alert.  Fairly oriented.  Follows commands.  Poor insight.  No apparent focal neuro deficit. PSYCH: Calm. Normal affect.   Procedures:  7/31-left TMA by Dr. Lajoyce Corners  Microbiology summarized: COVID-19, influenza and RSV PCR nonreactive Blood cultures NGTD  Assessment and plan: Principal Problem:   Cellulitis of left foot Active Problems:   Essential hypertension   Type 2 diabetes mellitus with peripheral neuropathy (HCC)   Seizure disorder (HCC)   MDD (major depressive disorder), recurrent episode, moderate (HCC)   History of traumatic brain injury   Cutaneous abscess of left foot   PAD (peripheral artery disease) (HCC)   Severe protein-calorie malnutrition (HCC)  Left foot cellulitis with gangrene/osteomyelitis -MRI with osteo of the 2nd toe and plantar muscles suggesting myositis -Vascular has consulted and there is no indication for vascular intervention at this time -S/p left TMA and wound VAC placement by Dr. Lajoyce Corners on 7/31. -Per Ortho, surgical source control achieved.  Antibiotic discontinued. -TDWB on the left -Pain control and VTE prophylaxis per surgery -PT/OT-recommended home health and DME but no safe disposition.   H/o TBI/cognitive impairment -Continue Prozac, hydroxyzine, and Risperdal   Unsafe living situation: Sister says living in a [place], unsafe living situation.  Patient with TBI/cognitive impairment.  Has no capacity to make medical decision. -TOC filed APS  report.   Controlled DM-2 with hyperglycemia: A1c 5.8%. Recent Labs  Lab 06/07/23 1143 06/07/23 1626 06/07/23 2021 06/08/23 0710 06/08/23 1122  GLUCAP 138* 164* 198* 180* 137*  -Continue current insulin regimen  Normocytic anemia: H&H stable.  Anemia panel suggests anemia of chronic disease. Recent Labs    05/26/23 2023 05/27/23 0659 05/28/23 0208 05/29/23 0408  05/30/23 0229 06/02/23 0112 06/03/23 0902 06/04/23 0333 06/05/23 0137 06/07/23 0105  HGB 9.1* 8.9* 8.6* 8.3* 8.1* 8.0* 8.8* 7.8* 7.7* 8.2*  -Continue monitoring   Seizure disorder  -Continue Keppra    HLD -Continue atorvastatin   HTN -Continue lisinopril  Hyponatremia: Resolved.  Increased nutrient needs/surgical wound Body mass index is 22.02 kg/m. Nutrition Problem: Increased nutrient needs Etiology: acute illness (L foot gangrene with osteomyelitis; wound vac) Signs/Symptoms: estimated needs Interventions: Refer to RD note for recommendations, Ensure Enlive (each supplement provides 350kcal and 20 grams of protein), Juven, MVI   DVT prophylaxis:  SCD's Start: 06/02/23 1515 enoxaparin (LOVENOX) injection 40 mg Start: 05/27/23 1000  Code Status: Full code Family Communication: None at bedside Level of care: Med-Surg Status is: Inpatient Remains inpatient appropriate because: Lack of safe disposition.   Final disposition: No safe disposition  Consultants:  Orthopedic surgery  25 minutes with more than 50% spent in reviewing records, counseling patient/family and coordinating care.   Sch Meds:  Scheduled Meds:  vitamin C  1,000 mg Oral Daily   atorvastatin  10 mg Oral Daily   docusate sodium  100 mg Oral Daily   enoxaparin (LOVENOX) injection  40 mg Subcutaneous Daily   feeding supplement  237 mL Oral BID BM   FLUoxetine  30 mg Oral Daily   insulin aspart  0-5 Units Subcutaneous QHS   insulin aspart  0-9 Units Subcutaneous TID WC   insulin glargine-yfgn  10 Units Subcutaneous QHS   levETIRAcetam  500 mg Oral BID   lisinopril  2.5 mg Oral Daily   multivitamins with iron  1 tablet Oral Daily   nutrition supplement (JUVEN)  1 packet Oral BID BM   pantoprazole  40 mg Oral Daily   risperiDONE  0.25 mg Oral QHS   zinc sulfate  220 mg Oral Daily   Continuous Infusions:   PRN Meds:.acetaminophen **OR** acetaminophen, alum & mag hydroxide-simeth, bisacodyl,  guaiFENesin-dextromethorphan, hydrALAZINE, HYDROmorphone (DILAUDID) injection, labetalol, magnesium citrate, metoprolol tartrate, ondansetron **OR** ondansetron (ZOFRAN) IV, oxyCODONE, oxyCODONE, phenol, polyethylene glycol, senna-docusate  Antimicrobials: Anti-infectives (From admission, onward)    Start     Dose/Rate Route Frequency Ordered Stop   06/03/23 0600  ceFAZolin (ANCEF) IVPB 2g/100 mL premix        2 g 200 mL/hr over 30 Minutes Intravenous On call to O.R. 06/02/23 1149 06/02/23 1330   06/02/23 2200  ceFAZolin (ANCEF) IVPB 2g/100 mL premix        2 g 200 mL/hr over 30 Minutes Intravenous Every 8 hours 06/02/23 1514 06/03/23 0639   06/02/23 1142  ceFAZolin (ANCEF) 2-4 GM/100ML-% IVPB       Note to Pharmacy: [facility] [name]: cabinet override      06/02/23 1142 06/02/23 1330   06/01/23 1715  metroNIDAZOLE (FLAGYL) IVPB 500 mg  Status:  Discontinued        500 mg 100 mL/hr over 60 Minutes Intravenous Every 12 hours 06/01/23 1615 06/02/23 1515   05/30/23 0800  ceFAZolin (ANCEF) IVPB 2g/100 mL premix        2 g 200 mL/hr over 30 Minutes Intravenous On call to O.R.  05/30/23 0412 05/30/23 1209   05/28/23 1500  vancomycin (VANCOREADY) IVPB 1250 mg/250 mL  Status:  Discontinued        1,250 mg 166.7 mL/hr over 90 Minutes Intravenous Every 24 hours 05/28/23 1211 06/02/23 1515   05/28/23 1145  vancomycin (VANCOREADY) IVPB 1250 mg/250 mL  Status:  Discontinued        1,250 mg 166.7 mL/hr over 90 Minutes Intravenous Every 24 hours 05/28/23 1051 05/28/23 1211   05/26/23 2030  cefTRIAXone (ROCEPHIN) 2 g in sodium chloride 0.9 % 100 mL IVPB        2 g 200 mL/hr over 30 Minutes Intravenous Every 24 hours 05/26/23 2026 06/01/23 1845        I have personally reviewed the following labs and images: CBC: Recent Labs  Lab 06/02/23 0112 06/03/23 0902 06/04/23 0333 06/05/23 0137 06/07/23 0105  WBC 9.3 8.5 7.3 5.2 9.0  NEUTROABS 7.4  --   --   --   --   HGB 8.0* 8.8* 7.8* 7.7* 8.2*  HCT  23.9* 26.6* 23.6* 23.3* 24.8*  MCV 85.1 86.4 86.8 86.6 85.5  PLT 360 405* 360 322 382   BMP &GFR Recent Labs  Lab 06/02/23 0112 06/03/23 0902 06/04/23 0333 06/07/23 0105  NA 133* 131* 135 133*  K 4.5 4.2 4.5 5.0  CL 98 97* 100 97*  CO2 28 25 27 28   GLUCOSE 117* 145* 143* 211*  BUN 17 24* 27* 44*  CREATININE 0.77 0.78 1.03 0.98  CALCIUM 8.3* 8.3* 8.4* 8.3*  MG 1.8 1.8 1.8 2.2  PHOS 3.1 3.5 3.2 2.7   Estimated Creatinine Clearance: 64.2 mL/min (by C-G formula based on SCr of 0.98 mg/dL). Liver & Pancreas: Recent Labs  Lab 06/02/23 0112 06/03/23 0902 06/04/23 0333 06/07/23 0105  ALBUMIN 2.0* 2.2* 2.0* 2.4*   No results for input(s): "LIPASE", "AMYLASE" in the last 168 hours. No results for input(s): "AMMONIA" in the last 168 hours. Diabetic: No results for input(s): "HGBA1C" in the last 72 hours. Recent Labs  Lab 06/07/23 1143 06/07/23 1626 06/07/23 2021 06/08/23 0710 06/08/23 1122  GLUCAP 138* 164* 198* 180* 137*   Cardiac Enzymes: No results for input(s): "CKTOTAL", "CKMB", "CKMBINDEX", "TROPONINI" in the last 168 hours. No results for input(s): "PROBNP" in the last 8760 hours. Coagulation Profile: No results for input(s): "INR", "PROTIME" in the last 168 hours.  Thyroid Function Tests: No results for input(s): "TSH", "T4TOTAL", "FREET4", "T3FREE", "THYROIDAB" in the last 72 hours. Lipid Profile: No results for input(s): "CHOL", "HDL", "LDLCALC", "TRIG", "CHOLHDL", "LDLDIRECT" in the last 72 hours. Anemia Panel: No results for input(s): "VITAMINB12", "FOLATE", "FERRITIN", "TIBC", "IRON", "RETICCTPCT" in the last 72 hours.  Urine analysis:    Component Value Date/Time   COLORURINE AMBER (A) 05/26/2023 2026   APPEARANCEUR CLEAR 05/26/2023 2026   APPEARANCEUR Clear 01/23/2021 1634   LABSPEC 1.020 05/26/2023 2026   PHURINE 5.0 05/26/2023 2026   GLUCOSEU NEGATIVE 05/26/2023 2026   GLUCOSEU NEGATIVE 10/01/2020 0853   HGBUR NEGATIVE 05/26/2023 2026    BILIRUBINUR NEGATIVE 05/26/2023 2026   BILIRUBINUR Negative 01/23/2021 1634   KETONESUR 5 (A) 05/26/2023 2026   PROTEINUR 100 (A) 05/26/2023 2026   UROBILINOGEN 0.2 10/01/2020 0853   NITRITE NEGATIVE 05/26/2023 2026   LEUKOCYTESUR NEGATIVE 05/26/2023 2026   Sepsis Labs: Invalid input(s): "PROCALCITONIN", "LACTICIDVEN"  Microbiology: No results found for this or any previous visit (from the past 240 hour(s)).   Radiology Studies: No results found.     T.  [facility]  If 7PM-7AM, please contact night-coverage [email or web address] 06/08/2023, 2:19 PM

## 2023-06-08 NOTE — TOC Progression Note (Signed)
Transition of Care [facility]) - Progression Note    Patient Details  Name: [name] MRN: [id number] Date of Birth: 1966-05-31  [facility] of [facility]) CM/SW Contact  Lorri Frederick, LCSW Phone Number: 06/08/2023, 2:43 PM  Clinical Narrative:   CSW spoke with APS, was informed that case has been assigned to social worker [name], [phone].  LM with [name].      Expected Discharge Plan: Home w Home Health Services Barriers to Discharge: No Barriers Identified  Expected Discharge Plan and Services       Living arrangements for the past 2 months: Single Family Home                                       Social Determinants of Health (SDOH) Interventions SDOH Screenings   Depression (PHQ2-9): Low Risk  (03/11/2023)  Tobacco Use: Low Risk  (06/02/2023)    Readmission Risk Interventions     No data to display

## 2023-06-08 NOTE — Plan of Care (Signed)
Problem: Education: Goal: Ability to describe self-care measures that may prevent or decrease complications (Diabetes Survival Skills Education) will improve Outcome: Progressing Goal: Individualized Educational Video(s) Outcome: Progressing   Problem: Coping: Goal: Ability to adjust to condition or change in health will improve Outcome: Progressing   Problem: Fluid Volume: Goal: Ability to maintain a balanced intake and output will improve Outcome: Progressing   Problem: Health Behavior/Discharge Planning: Goal: Ability to identify and utilize available resources and services will improve Outcome: Progressing Goal: Ability to manage health-related needs will improve Outcome: Progressing   Problem: Metabolic: Goal: Ability to maintain appropriate glucose levels will improve Outcome: Progressing   Problem: Nutritional: Goal: Maintenance of adequate nutrition will improve Outcome: Progressing Goal: Progress toward achieving an optimal weight will improve Outcome: Progressing   Problem: Skin Integrity: Goal: Risk for impaired skin integrity will decrease Outcome: Progressing   Problem: Tissue Perfusion: Goal: Adequacy of tissue perfusion will improve Outcome: Progressing   Problem: Education: Goal: Knowledge of General Education information will improve Description: Including pain rating scale, medication(s)/side effects and non-pharmacologic comfort measures Outcome: Progressing   Problem: Health Behavior/Discharge Planning: Goal: Ability to manage health-related needs will improve Outcome: Progressing   Problem: Clinical Measurements: Goal: Ability to maintain clinical measurements within normal limits will improve Outcome: Progressing Goal: Will remain free from infection Outcome: Progressing Goal: Diagnostic test results will improve Outcome: Progressing Goal: Respiratory complications will improve Outcome: Progressing Goal: Cardiovascular complication will  be avoided Outcome: Progressing   Problem: Activity: Goal: Risk for activity intolerance will decrease Outcome: Progressing   Problem: Nutrition: Goal: Adequate nutrition will be maintained Outcome: Progressing   Problem: Coping: Goal: Level of anxiety will decrease Outcome: Progressing   Problem: Elimination: Goal: Will not experience complications related to bowel motility Outcome: Progressing Goal: Will not experience complications related to urinary retention Outcome: Progressing   Problem: Pain Managment: Goal: General experience of comfort will improve Outcome: Progressing   Problem: Safety: Goal: Ability to remain free from injury will improve Outcome: Progressing   Problem: Skin Integrity: Goal: Risk for impaired skin integrity will decrease Outcome: Progressing   Problem: Education: Goal: Knowledge of General Education information will improve Description: Including pain rating scale, medication(s)/side effects and non-pharmacologic comfort measures Outcome: Progressing   Problem: Health Behavior/Discharge Planning: Goal: Ability to manage health-related needs will improve Outcome: Progressing   Problem: Clinical Measurements: Goal: Ability to maintain clinical measurements within normal limits will improve Outcome: Progressing Goal: Will remain free from infection Outcome: Progressing Goal: Diagnostic test results will improve Outcome: Progressing Goal: Respiratory complications will improve Outcome: Progressing Goal: Cardiovascular complication will be avoided Outcome: Progressing   Problem: Activity: Goal: Risk for activity intolerance will decrease Outcome: Progressing   Problem: Nutrition: Goal: Adequate nutrition will be maintained Outcome: Progressing   Problem: Coping: Goal: Level of anxiety will decrease Outcome: Progressing   Problem: Elimination: Goal: Will not experience complications related to bowel motility Outcome:  Progressing Goal: Will not experience complications related to urinary retention Outcome: Progressing   Problem: Pain Managment: Goal: General experience of comfort will improve Outcome: Progressing   Problem: Safety: Goal: Ability to remain free from injury will improve Outcome: Progressing   Problem: Skin Integrity: Goal: Risk for impaired skin integrity will decrease Outcome: Progressing   Problem: Education: Goal: Knowledge of the prescribed therapeutic regimen will improve Outcome: Progressing Goal: Ability to verbalize activity precautions or restrictions will improve Outcome: Progressing Goal: Understanding of discharge needs will improve Outcome: Progressing   Problem: Activity: Goal: Ability  to perform//tolerate increased activity and mobilize with assistive devices will improve Outcome: Progressing

## 2023-06-09 LAB — GLUCOSE, CAPILLARY
Glucose-Capillary: 118 mg/dL — ABNORMAL HIGH (ref 70–99)
Glucose-Capillary: 117 mg/dL — ABNORMAL HIGH (ref 70–99)
Glucose-Capillary: 129 mg/dL — ABNORMAL HIGH (ref 70–99)
Glucose-Capillary: 133 mg/dL — ABNORMAL HIGH (ref 70–99)
Glucose-Capillary: 175 mg/dL — ABNORMAL HIGH (ref 70–99)
Glucose-Capillary: 139 mg/dL — ABNORMAL HIGH (ref 70–99)

## 2023-06-09 NOTE — Progress Notes (Signed)
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 He was found to have L foot gangrene with osteomyelitis. Dr. Lajoyce Corners was consulted and he is scheduled for TMA on 7/28. He is on Rocephin and Vancomycin. Dr. Edilia Bo consulted, believes he has adequate circulation for surgery and recommended reconsulting if signs of poor circulation during surgery.  Underwent left TMA with wound VAC placement by Dr. Lajoyce Corners on 7/31.  He will be TDWB per ortho.  Therapy recommended home health but no safe disposition.  Per sister's report to [facility], "he was staying with some people because he couldn't stay with sister anymore due to violent behaviors. Sister was bringing food and they were letting him stay for free. And now those people are saying he can't come back and sister can't take him either".  TOC filed APS report.   Subjective: Seen and examined earlier this morning.  No major events overnight of this morning.  No complaints.  Objective: Vitals:   06/08/23 0720 06/08/23 1320 06/08/23 2040 06/09/23 0510  BP:  106/74 97/62 (!) 94/53  Pulse:  69 80 70  Resp:  17 17 17   Temp:  (!) 97.5 F (36.4 C) 98 F (36.7 C) 97.6 F (36.4 C)  TempSrc:  Oral Oral   SpO2: 100% 100% 99% 97%  Weight:        Examination:  GENERAL: No apparent distress.  Nontoxic. HEENT: MMM.  Vision and hearing grossly intact.  NECK: Supple.  No apparent JVD.  RESP:  No IWOB.  Fair aeration bilaterally. CVS:  RRR. Heart sounds normal.  ABD/GI/GU: BS+. Abd soft, NTND.  MSK/EXT:  Moves extremities. S/p left TMA.   Wound VAC in place.  Small serosanguineous fluid in canister. SKIN: As above. NEURO: Awake and alert.  Fairly oriented.  Follows commands.  Poor insight.  No apparent focal neuro deficit. PSYCH: Calm. Normal affect.   Procedures:  7/31-left TMA by Dr. Lajoyce Corners  Microbiology summarized: COVID-19, influenza and RSV PCR nonreactive Blood cultures NGTD  Assessment and plan: Principal Problem:   Cellulitis of left foot Active Problems:   Essential hypertension   Type 2 diabetes mellitus with peripheral neuropathy (HCC)   Seizure disorder (HCC)   MDD (major depressive disorder), recurrent episode, moderate (HCC)   History of traumatic brain injury   Cutaneous abscess of left foot   PAD (peripheral artery disease) (HCC)   Severe protein-calorie malnutrition (HCC)  Left foot cellulitis with gangrene/osteomyelitis -MRI with osteo of the 2nd toe and plantar muscles suggesting myositis -Vascular has consulted and there is no indication for vascular intervention at this time -S/p left TMA and wound VAC placement by Dr. Lajoyce Corners on 7/31. -Per Ortho, surgical source control achieved.  Antibiotic discontinued. -TDWB on the left -Pain control and VTE prophylaxis per surgery -PT/OT-recommended home health and DME but no safe disposition. -OOB   H/o TBI/cognitive impairment -Continue Prozac, hydroxyzine, and Risperdal   Unsafe living situation: Sister says living in a [place], unsafe living situation.  Patient with TBI/cognitive impairment.  Has no capacity to make medical decision. -TOC filed APS report.  Controlled DM-2 with hyperglycemia: A1c 5.8%. Recent Labs  Lab 06/08/23 1615 06/08/23 2052 06/09/23 0626 06/09/23 0715 06/09/23 1206  GLUCAP 185* 154* 118* 117* 129*  -Continue current insulin regimen  Normocytic anemia: H&H stable.  Anemia panel suggests anemia of chronic disease. Recent Labs    05/26/23 2023 05/27/23 0659 05/28/23 0208 05/29/23 0408 05/30/23 0229 06/02/23 0112  06/03/23 0902 06/04/23 0333 06/05/23 0137 06/07/23 0105  HGB 9.1* 8.9* 8.6* 8.3* 8.1* 8.0* 8.8* 7.8* 7.7* 8.2*  -Continue monitoring   Seizure disorder  -Continue Keppra    HLD -Continue atorvastatin   HTN -Continue lisinopril  Hyponatremia: Resolved.  Increased nutrient needs/surgical wound Body mass index is 22.02 kg/m. Nutrition Problem: Increased nutrient needs Etiology: acute illness (L foot gangrene with osteomyelitis; wound vac) Signs/Symptoms: estimated needs Interventions: Refer to RD note for recommendations, Ensure Enlive (each supplement provides 350kcal and 20 grams of protein), Juven, MVI   DVT prophylaxis:  SCD's Start: 06/02/23 1515 enoxaparin (LOVENOX) injection 40 mg Start: 05/27/23 1000  Code Status: Full code Family Communication: None at bedside Level of care: Med-Surg Status is: Inpatient Remains inpatient appropriate because: Lack of safe disposition.   Final disposition: No safe disposition  Consultants:  Orthopedic surgery  25 minutes with more than 50% spent in reviewing records, counseling patient/family and coordinating care.   Sch Meds:  Scheduled Meds:  vitamin C  1,000 mg Oral Daily   atorvastatin  10 mg Oral Daily   docusate sodium  100 mg Oral Daily   enoxaparin (LOVENOX) injection  40 mg Subcutaneous Daily   feeding supplement  237 mL Oral BID BM   FLUoxetine  30 mg Oral Daily   insulin aspart  0-5 Units Subcutaneous QHS   insulin aspart  0-9 Units Subcutaneous TID WC   insulin glargine-yfgn  10 Units Subcutaneous QHS   levETIRAcetam  500 mg Oral BID   lisinopril  2.5 mg Oral Daily   multivitamins with iron  1 tablet Oral Daily   nutrition supplement (JUVEN)  1 packet Oral BID BM   pantoprazole  40 mg Oral Daily   risperiDONE  0.25 mg Oral QHS   zinc sulfate  220 mg Oral Daily   Continuous Infusions:   PRN Meds:.acetaminophen **OR** acetaminophen, alum & mag hydroxide-simeth, bisacodyl, guaiFENesin-dextromethorphan,  hydrALAZINE, HYDROmorphone (DILAUDID) injection, labetalol, magnesium citrate, metoprolol tartrate, ondansetron **OR** ondansetron (ZOFRAN) IV, oxyCODONE, oxyCODONE, phenol, polyethylene glycol, senna-docusate  Antimicrobials: Anti-infectives (From admission, onward)    Start     Dose/Rate Route Frequency Ordered Stop   06/03/23 0600  ceFAZolin (ANCEF) IVPB 2g/100 mL premix        2 g 200 mL/hr over 30 Minutes Intravenous On call to O.R. 06/02/23 1149 06/02/23 1330   06/02/23 2200  ceFAZolin (ANCEF) IVPB 2g/100 mL premix        2 g 200 mL/hr over 30 Minutes Intravenous Every 8 hours 06/02/23 1514 06/03/23 0639   06/02/23 1142  ceFAZolin (ANCEF) 2-4 GM/100ML-% IVPB       Note to Pharmacy: [facility] [name]: cabinet override      06/02/23 1142 06/02/23 1330   06/01/23 1715  metroNIDAZOLE (FLAGYL) IVPB 500 mg  Status:  Discontinued        500 mg 100 mL/hr over 60 Minutes Intravenous Every 12 hours 06/01/23 1615 06/02/23 1515   05/30/23 0800  ceFAZolin (ANCEF) IVPB 2g/100 mL premix        2 g 200 mL/hr over 30 Minutes Intravenous On call to O.R. 05/30/23 6644 05/30/23  1209   05/28/23 1500  vancomycin (VANCOREADY) IVPB 1250 mg/250 mL  Status:  Discontinued        1,250 mg 166.7 mL/hr over 90 Minutes Intravenous Every 24 hours 05/28/23 1211 06/02/23 1515   05/28/23 1145  vancomycin (VANCOREADY) IVPB 1250 mg/250 mL  Status:  Discontinued        1,250 mg 166.7 mL/hr over 90 Minutes Intravenous Every 24 hours 05/28/23 1051 05/28/23 1211   05/26/23 2030  cefTRIAXone (ROCEPHIN) 2 g in sodium chloride 0.9 % 100 mL IVPB        2 g 200 mL/hr over 30 Minutes Intravenous Every 24 hours 05/26/23 2026 06/01/23 1845        I have personally reviewed the following labs and images: CBC: Recent Labs  Lab 06/03/23 0902 06/04/23 0333 06/05/23 0137 06/07/23 0105  WBC 8.5 7.3 5.2 9.0  HGB 8.8* 7.8* 7.7* 8.2*  HCT 26.6* 23.6* 23.3* 24.8*  MCV 86.4 86.8 86.6 85.5  PLT 405* 360 322 382   BMP  &GFR Recent Labs  Lab 06/03/23 0902 06/04/23 0333 06/07/23 0105  NA 131* 135 133*  K 4.2 4.5 5.0  CL 97* 100 97*  CO2 25 27 28   GLUCOSE 145* 143* 211*  BUN 24* 27* 44*  CREATININE 0.78 1.03 0.98  CALCIUM 8.3* 8.4* 8.3*  MG 1.8 1.8 2.2  PHOS 3.5 3.2 2.7   Estimated Creatinine Clearance: 64.2 mL/min (by C-G formula based on SCr of 0.98 mg/dL). Liver & Pancreas: Recent Labs  Lab 06/03/23 0902 06/04/23 0333 06/07/23 0105  ALBUMIN 2.2* 2.0* 2.4*   No results for input(s): "LIPASE", "AMYLASE" in the last 168 hours. No results for input(s): "AMMONIA" in the last 168 hours. Diabetic: No results for input(s): "HGBA1C" in the last 72 hours. Recent Labs  Lab 06/08/23 1615 06/08/23 2052 06/09/23 0626 06/09/23 0715 06/09/23 1206  GLUCAP 185* 154* 118* 117* 129*   Cardiac Enzymes: No results for input(s): "CKTOTAL", "CKMB", "CKMBINDEX", "TROPONINI" in the last 168 hours. No results for input(s): "PROBNP" in the last 8760 hours. Coagulation Profile: No results for input(s): "INR", "PROTIME" in the last 168 hours.  Thyroid Function Tests: No results for input(s): "TSH", "T4TOTAL", "FREET4", "T3FREE", "THYROIDAB" in the last 72 hours. Lipid Profile: No results for input(s): "CHOL", "HDL", "LDLCALC", "TRIG", "CHOLHDL", "LDLDIRECT" in the last 72 hours. Anemia Panel: No results for input(s): "VITAMINB12", "FOLATE", "FERRITIN", "TIBC", "IRON", "RETICCTPCT" in the last 72 hours.  Urine analysis:    Component Value Date/Time   COLORURINE AMBER (A) 05/26/2023 2026   APPEARANCEUR CLEAR 05/26/2023 2026   APPEARANCEUR Clear 01/23/2021 1634   LABSPEC 1.020 05/26/2023 2026   PHURINE 5.0 05/26/2023 2026   GLUCOSEU NEGATIVE 05/26/2023 2026   GLUCOSEU NEGATIVE 10/01/2020 0853   HGBUR NEGATIVE 05/26/2023 2026   BILIRUBINUR NEGATIVE 05/26/2023 2026   BILIRUBINUR Negative 01/23/2021 1634   KETONESUR 5 (A) 05/26/2023 2026   PROTEINUR 100 (A) 05/26/2023 2026   UROBILINOGEN 0.2  10/01/2020 0853   NITRITE NEGATIVE 05/26/2023 2026   LEUKOCYTESUR NEGATIVE 05/26/2023 2026   Sepsis Labs: Invalid input(s): "PROCALCITONIN", "LACTICIDVEN"  Microbiology: No results found for this or any previous visit (from the past 240 hour(s)).   Radiology Studies: No results found.     T.  [facility]  If 7PM-7AM, please contact night-coverage [email or web address] 06/09/2023, 1:53 PM

## 2023-06-09 NOTE — Plan of Care (Signed)
  Problem: Education: Goal: Knowledge of General Education information will improve Description: Including pain rating scale, medication(s)/side effects and non-pharmacologic comfort measures Outcome: Progressing   Problem: Activity: Goal: Risk for activity intolerance will decrease Outcome: Progressing   Problem: Nutrition: Goal: Adequate nutrition will be maintained Outcome: Progressing   

## 2023-06-09 NOTE — Progress Notes (Incomplete)
    Patient Name: [name]           DOB: 1966/09/24  MRN: [id number]      Admission Date: 05/26/2023  Attending Provider: Almon Hercules, MD  Primary Diagnosis: Cellulitis of left foot   Level of care: Med-Surg    CROSS COVER NOTE   Date of Service   06/09/2023   [name], 57 y.o. male, was admitted on 05/26/2023 for Cellulitis of left foot.    HPI/Events of Note   Unwitnessed fall.  Fall occurred while patient was sitting on the edge of bed.  Patient is currently A/O x4.  No loss of consciousness prior to and/or after the fall.  Denies head injury. There are no visible sign of injury or deformities.  Skin is intact. Patient denies pain.  ROM at baseline. Patient was assisted back to bed successfully.   Interventions/ Plan   No further investigative work-up necessary at this time.  Nursing staff to continue monitoring for change in acute symptoms, mobility, and pain. Fall precaution        Anthoney Harada, DNP, ACNPC- AG [facility]

## 2023-06-10 LAB — GLUCOSE, CAPILLARY
Glucose-Capillary: 168 mg/dL — ABNORMAL HIGH (ref 70–99)
Glucose-Capillary: 136 mg/dL — ABNORMAL HIGH (ref 70–99)
Glucose-Capillary: 138 mg/dL — ABNORMAL HIGH (ref 70–99)
Glucose-Capillary: 128 mg/dL — ABNORMAL HIGH (ref 70–99)

## 2023-06-10 MED ORDER — LOPERAMIDE HCL 2 MG PO CAPS
2.0000 mg | ORAL_CAPSULE | ORAL | Status: DC | PRN
  Administered 2023-06-10: 2 mg via ORAL
  Filled 2023-06-10 (×3): qty 1

## 2023-06-10 NOTE — Progress Notes (Signed)
Patient was found by writer sitting on the floor by his bed. When asked patient what happened he stated I was sitting on the side of the bed and fell off to the floor. Patient denies hitting his head. No injuries noted full ROM of upper extremities and limited ROM to LLE due to amputation of toes. Wound vac was disconnected and compression dressing applied to left foot. On call provider notified Anthoney Harada NP continue to monitor patient. Patient was assisted back in bed by NT and Nurses. Patient denies any pain or discomfort. Patient bed in low position, bed alarm in place, call bell within reach, bedside table in reach, urinal in reach.  06/09/23 2255  What Happened  Was fall witnessed? No  Was patient injured? No  Patient found on floor  Found by Staff-comment  Stated prior activity other (comment)  Provider Notification  Provider Name/Title Anthoney Harada NP  Date Provider Notified 06/09/23  Time Provider Notified 2306  Method of Notification Page  Notification Reason Fall  Provider response Evaluate remotely  Date of Provider Response 06/09/23  Time of Provider Response 2314  Follow Up  Family notified No - patient refusal  Additional tests No  Progress note created (see row info) Yes  Blank note created Yes  Adult Fall Risk Assessment  Risk Factor Category (scoring not indicated) High fall risk per protocol (document High fall risk)  Age 57  Fall History: Fall within 6 months prior to admission 0  Elimination; Bowel and/or Urine Incontinence 2  Elimination; Bowel and/or Urine Urgency/Frequency 2  Medications: includes PCA/Opiates, Anti-convulsants, Anti-hypertensives, Diuretics, Hypnotics, Laxatives, Sedatives, and Psychotropics 5  Patient Care Equipment 1  Mobility-Assistance 2  Mobility-Gait 2  Mobility-Sensory Deficit 0  Altered awareness of immediate physical environment 0  Impulsiveness 0  Lack of understanding of one's physical/cognitive limitations 4  Total Score 18   Patient Fall Risk Level High fall risk  Adult Fall Risk Interventions  Required Bundle Interventions *See Row Information* High fall risk - low, moderate, and high requirements implemented  Additional Interventions PT/OT need assessed if change in mobility from baseline  Fall intervention(s) refused/Patient educated regarding refusal Bed alarm;Open door if unsupervised  Screening for Fall Injury Risk (To be completed on HIGH fall risk patients) - Assessing Need for Floor Mats  Risk For Fall Injury- Criteria for Floor Mats Confusion/dementia (+NuDESC, CIWA, TBI, etc.)  Will Implement Floor Mats Yes  Vitals  Temp 98.3 F (36.8 C)  BP 125/66  MAP (mmHg) 84  BP Location Right Arm  BP Method Automatic  Patient Position (if appropriate) Lying  Pulse Rate 93  Pulse Rate Source Monitor  Resp 18  Oxygen Therapy  SpO2 99 %  O2 Device Room Air  Pain Assessment  Pain Scale 0-10  Pain Score 0  Neurological  Neuro (WDL) X  Level of Consciousness Alert  Orientation Level Oriented X4  Cognition Follows commands  Speech Clear  Musculoskeletal  Musculoskeletal (WDL) X  Assistive Device Front wheel walker  Generalized Weakness Yes  Weight Bearing Restrictions Yes  LLE Weight Bearing TWB  Musculoskeletal Details  LLE Limited movement  Left Toes Amputated toes  Integumentary  Integumentary (WDL) X  Skin Color Appropriate for ethnicity  Skin Condition Dry  Skin Integrity Intact  Skin Turgor Non-tenting

## 2023-06-10 NOTE — Progress Notes (Signed)
Patient found on the ground following bed alarm alert. He immediately stood back up and sat in the bed. He stated that he wanted to get up. He said he was not in any pain and the fall did not hurt him in any way. VSS; pertinent systems stable, provider was notified.   06/10/23 1500  What Happened  Was fall witnessed? No  Was patient injured? No  Patient found on floor  Found by Staff-comment Lloyd Huger)  Provider Notification  Provider Name/Title Almon Hercules MD  Date Provider Notified 06/10/23  Time Provider Notified 1600  Method of Notification  (Secure chat)  Notification Reason Fall  Provider response Other (Comment);Evaluate remotely (Move patient next to nursing station)  Date of Provider Response 06/10/23  Time of Provider Response 1600  Follow Up  Family notified No - patient refusal  Additional tests No  Progress note created (see row info) Yes  Blank note created Yes  Adult Fall Risk Assessment  Risk Factor Category (scoring not indicated) High fall risk per protocol (document High fall risk)  Age 57  Fall History: Fall within 6 months prior to admission 0  Elimination; Bowel and/or Urine Incontinence 2  Elimination; Bowel and/or Urine Urgency/Frequency 2  Medications: includes PCA/Opiates, Anti-convulsants, Anti-hypertensives, Diuretics, Hypnotics, Laxatives, Sedatives, and Psychotropics 5  Patient Care Equipment 1  Mobility-Assistance 2  Mobility-Gait 2  Mobility-Sensory Deficit 0  Altered awareness of immediate physical environment 0  Impulsiveness 2  Lack of understanding of one's physical/cognitive limitations 4  Total Score 20  Patient Fall Risk Level High fall risk  Adult Fall Risk Interventions  Required Bundle Interventions *See Row Information* High fall risk - low, moderate, and high requirements implemented  Additional Interventions Pharmacy review of medications;PT/OT need assessed if change in mobility from baseline;Use of appropriate toileting  equipment (bedpan, BSC, etc.)  Fall intervention(s) refused/Patient educated regarding refusal Bed alarm;Open door if unsupervised  Screening for Fall Injury Risk (To be completed on HIGH fall risk patients) - Assessing Need for Floor Mats  Risk For Fall Injury- Criteria for Floor Mats Confusion/dementia (+NuDESC, CIWA, TBI, etc.)  Will Implement Floor Mats Yes

## 2023-06-10 NOTE — Plan of Care (Signed)
Problem: Education: Goal: Ability to describe self-care measures that may prevent or decrease complications (Diabetes Survival Skills Education) will improve Outcome: Progressing Goal: Individualized Educational Video(s) Outcome: Progressing   Problem: Coping: Goal: Ability to adjust to condition or change in health will improve Outcome: Progressing   Problem: Fluid Volume: Goal: Ability to maintain a balanced intake and output will improve Outcome: Progressing   Problem: Health Behavior/Discharge Planning: Goal: Ability to identify and utilize available resources and services will improve Outcome: Progressing Goal: Ability to manage health-related needs will improve Outcome: Progressing   Problem: Metabolic: Goal: Ability to maintain appropriate glucose levels will improve Outcome: Progressing   Problem: Nutritional: Goal: Maintenance of adequate nutrition will improve Outcome: Progressing Goal: Progress toward achieving an optimal weight will improve Outcome: Progressing   Problem: Skin Integrity: Goal: Risk for impaired skin integrity will decrease Outcome: Progressing   Problem: Tissue Perfusion: Goal: Adequacy of tissue perfusion will improve Outcome: Progressing   Problem: Education: Goal: Knowledge of General Education information will improve Description: Including pain rating scale, medication(s)/side effects and non-pharmacologic comfort measures Outcome: Progressing   Problem: Health Behavior/Discharge Planning: Goal: Ability to manage health-related needs will improve Outcome: Progressing   Problem: Clinical Measurements: Goal: Ability to maintain clinical measurements within normal limits will improve Outcome: Progressing Goal: Will remain free from infection Outcome: Progressing Goal: Diagnostic test results will improve Outcome: Progressing Goal: Respiratory complications will improve Outcome: Progressing Goal: Cardiovascular complication will  be avoided Outcome: Progressing   Problem: Activity: Goal: Risk for activity intolerance will decrease Outcome: Progressing   Problem: Nutrition: Goal: Adequate nutrition will be maintained Outcome: Progressing   Problem: Coping: Goal: Level of anxiety will decrease Outcome: Progressing   Problem: Elimination: Goal: Will not experience complications related to bowel motility Outcome: Progressing Goal: Will not experience complications related to urinary retention Outcome: Progressing   Problem: Pain Managment: Goal: General experience of comfort will improve Outcome: Progressing   Problem: Safety: Goal: Ability to remain free from injury will improve Outcome: Progressing   Problem: Skin Integrity: Goal: Risk for impaired skin integrity will decrease Outcome: Progressing   Problem: Education: Goal: Knowledge of General Education information will improve Description: Including pain rating scale, medication(s)/side effects and non-pharmacologic comfort measures Outcome: Progressing   Problem: Health Behavior/Discharge Planning: Goal: Ability to manage health-related needs will improve Outcome: Progressing   Problem: Clinical Measurements: Goal: Ability to maintain clinical measurements within normal limits will improve Outcome: Progressing Goal: Will remain free from infection Outcome: Progressing Goal: Diagnostic test results will improve Outcome: Progressing Goal: Respiratory complications will improve Outcome: Progressing Goal: Cardiovascular complication will be avoided Outcome: Progressing   Problem: Activity: Goal: Risk for activity intolerance will decrease Outcome: Progressing   Problem: Nutrition: Goal: Adequate nutrition will be maintained Outcome: Progressing   Problem: Coping: Goal: Level of anxiety will decrease Outcome: Progressing   Problem: Elimination: Goal: Will not experience complications related to bowel motility Outcome:  Progressing Goal: Will not experience complications related to urinary retention Outcome: Progressing   Problem: Pain Managment: Goal: General experience of comfort will improve Outcome: Progressing   Problem: Safety: Goal: Ability to remain free from injury will improve Outcome: Progressing   Problem: Skin Integrity: Goal: Risk for impaired skin integrity will decrease Outcome: Progressing   Problem: Education: Goal: Knowledge of the prescribed therapeutic regimen will improve Outcome: Progressing Goal: Ability to verbalize activity precautions or restrictions will improve Outcome: Progressing Goal: Understanding of discharge needs will improve Outcome: Progressing   Problem: Activity: Goal: Ability  to perform//tolerate increased activity and mobilize with assistive devices will improve Outcome: Progressing

## 2023-06-10 NOTE — Progress Notes (Signed)
Patient ID: [name], male   DOB: 25-May-1966, 57 y.o.   MRN: [id number] Patient is postoperative day 8 transmetatarsal amputation left foot.  Patient fell out of bed last night and tore off the wound VAC drainage tube.  Will plan to have a dry dressing placed today and discontinue the wound VAC dressing.

## 2023-06-10 NOTE — Progress Notes (Signed)
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 He was found to have L foot gangrene with osteomyelitis. Dr. Lajoyce Corners was consulted and he is scheduled for TMA on 7/28. He is on Rocephin and Vancomycin. Dr. Edilia Bo consulted, believes he has adequate circulation for surgery and recommended reconsulting if signs of poor circulation during surgery.  Underwent left TMA with wound VAC placement by Dr. Lajoyce Corners on 7/31.  He will be TDWB per ortho.  Therapy recommended home health but no safe disposition.  Per sister's report to [facility], "he was staying with some people because he couldn't stay with sister anymore due to violent behaviors. Sister was bringing food and they were letting him stay for free. And now those people are saying he can't come back and sister can't take him either".  TOC filed APS report.   Subjective: Seen and examined earlier this morning.  Found sitting on bedside mat on the floor last night.  No apparent injury.  No focal neurodeficit.  Reportedly, he was sitting on the edge of the bed and fell down.   Objective: Vitals:   06/09/23 2035 06/09/23 2255 06/09/23 2306 06/10/23 0843  BP: 124/68 125/66 125/66 108/77  Pulse: 87 93 93 87  Resp: 18 18 18 18   Temp: 98.4 F (36.9 C) 98.3 F (36.8 C) 98.3 F (36.8 C) 98.5 F (36.9 C)  TempSrc: Oral   Oral  SpO2: 97% 99% 99% 99%  Weight:        Examination:  GENERAL: No apparent distress.  Nontoxic. HEENT: MMM.  Vision and hearing grossly intact.  NECK: Supple.  No apparent JVD.  RESP:  No IWOB.  Fair  aeration bilaterally. CVS:  RRR. Heart sounds normal.  ABD/GI/GU: BS+. Abd soft, NTND.  MSK/EXT:  Moves extremities. S/p left TMA.  Dressing DCI. NEURO: Awake and alert.  Fairly oriented.  Follows commands.  Poor insight.  No apparent focal neuro deficit. PSYCH: Calm. Normal affect.   Procedures:  7/31-left TMA by Dr. Lajoyce Corners  Microbiology summarized: COVID-19, influenza and RSV PCR nonreactive Blood cultures NGTD  Assessment and plan: Principal Problem:   Cellulitis of left foot Active Problems:   Essential hypertension   Type 2 diabetes mellitus with peripheral neuropathy (HCC)   Seizure disorder (HCC)   MDD (major depressive disorder), recurrent episode, moderate (HCC)   History of traumatic brain injury   Cutaneous abscess of left foot   PAD (peripheral artery disease) (HCC)   Severe protein-calorie malnutrition (HCC)  Left foot cellulitis with gangrene/osteomyelitis -MRI with osteo of the 2nd toe and plantar muscles suggesting myositis -Vascular has consulted and there is no indication for vascular intervention at this time -S/p left TMA and wound VAC placement by Dr. Lajoyce Corners on 7/31. -Per Ortho, surgical source control achieved.  Antibiotic discontinued. -TDWB on the left -Pain control and VTE prophylaxis per surgery -PT/OT-recommended home health and DME but no safe disposition. -OOB   H/o TBI/cognitive impairment -Continue Prozac, hydroxyzine, and Risperdal   Unsafe living situation: Sister says living in a [place], unsafe living situation.  Patient with TBI/cognitive  impairment.  Has no capacity to make medical decision. -TOC filed APS report.   Controlled DM-2 with hyperglycemia: A1c 5.8%. Recent Labs  Lab 06/09/23 1605 06/09/23 2037 06/09/23 2308 06/10/23 0731 06/10/23 1115  GLUCAP 133* 175* 139* 168* 136*  -Continue current insulin regimen  Normocytic anemia: H&H stable.  Anemia panel suggests anemia of chronic disease. Recent Labs    05/26/23 2023  05/27/23 0659 05/28/23 0208 05/29/23 0408 05/30/23 0229 06/02/23 0112 06/03/23 0902 06/04/23 0333 06/05/23 0137 06/07/23 0105  HGB 9.1* 8.9* 8.6* 8.3* 8.1* 8.0* 8.8* 7.8* 7.7* 8.2*  -Continue monitoring  Fall in the hospital: No apparent injury or acute focal neurodeficit. -Fall precaution   Seizure disorder  -Continue Keppra    HLD -Continue atorvastatin   HTN -Continue lisinopril  Hyponatremia: Resolved.  Increased nutrient needs/surgical wound Body mass index is 22.02 kg/m. Nutrition Problem: Increased nutrient needs Etiology: acute illness (L foot gangrene with osteomyelitis; wound vac) Signs/Symptoms: estimated needs Interventions: Refer to RD note for recommendations, Ensure Enlive (each supplement provides 350kcal and 20 grams of protein), Juven, MVI   DVT prophylaxis:  SCD's Start: 06/02/23 1515 enoxaparin (LOVENOX) injection 40 mg Start: 05/27/23 1000  Code Status: Full code Family Communication: None at bedside Level of care: Med-Surg Status is: Inpatient Remains inpatient appropriate because: Lack of safe disposition.   Final disposition: No safe disposition  Consultants:  Orthopedic surgery  25 minutes with more than 50% spent in reviewing records, counseling patient/family and coordinating care.   Sch Meds:  Scheduled Meds:  vitamin C  1,000 mg Oral Daily   atorvastatin  10 mg Oral Daily   docusate sodium  100 mg Oral Daily   enoxaparin (LOVENOX) injection  40 mg Subcutaneous Daily   feeding supplement  237 mL Oral BID BM   FLUoxetine  30 mg Oral Daily   insulin aspart  0-5 Units Subcutaneous QHS   insulin aspart  0-9 Units Subcutaneous TID WC   insulin glargine-yfgn  10 Units Subcutaneous QHS   levETIRAcetam  500 mg Oral BID   lisinopril  2.5 mg Oral Daily   multivitamins with iron  1 tablet Oral Daily   nutrition supplement (JUVEN)  1 packet Oral BID BM   pantoprazole  40 mg Oral Daily   risperiDONE  0.25 mg Oral QHS   zinc sulfate   220 mg Oral Daily   Continuous Infusions:   PRN Meds:.acetaminophen **OR** acetaminophen, alum & mag hydroxide-simeth, bisacodyl, guaiFENesin-dextromethorphan, hydrALAZINE, HYDROmorphone (DILAUDID) injection, labetalol, magnesium citrate, metoprolol tartrate, ondansetron **OR** ondansetron (ZOFRAN) IV, oxyCODONE, oxyCODONE, phenol, polyethylene glycol, senna-docusate  Antimicrobials: Anti-infectives (From admission, onward)    Start     Dose/Rate Route Frequency Ordered Stop   06/03/23 0600  ceFAZolin (ANCEF) IVPB 2g/100 mL premix        2 g 200 mL/hr over 30 Minutes Intravenous On call to O.R. 06/02/23 1149 06/02/23 1330   06/02/23 2200  ceFAZolin (ANCEF) IVPB 2g/100 mL premix        2 g 200 mL/hr over 30 Minutes Intravenous Every 8 hours 06/02/23 1514 06/03/23 0639   06/02/23 1142  ceFAZolin (ANCEF) 2-4 GM/100ML-% IVPB       Note to Pharmacy: [facility], [name]: cabinet override      06/02/23 1142 06/02/23 1330   06/01/23 1715  metroNIDAZOLE (FLAGYL) IVPB 500 mg  Status:  Discontinued        500 mg 100 mL/hr over 60 Minutes Intravenous Every 12 hours 06/01/23 1615 06/02/23 1515   05/30/23 0800  ceFAZolin (ANCEF) IVPB 2g/100 mL premix        2 g 200 mL/hr over 30 Minutes Intravenous On call to O.R. 05/30/23 0412 05/30/23 1209   05/28/23 1500  vancomycin (VANCOREADY) IVPB 1250 mg/250 mL  Status:  Discontinued        1,250 mg 166.7 mL/hr over 90 Minutes Intravenous Every 24 hours 05/28/23 1211 06/02/23 1515   05/28/23 1145  vancomycin (VANCOREADY) IVPB 1250 mg/250 mL  Status:  Discontinued        1,250 mg 166.7 mL/hr over 90 Minutes Intravenous Every 24 hours 05/28/23 1051 05/28/23 1211   05/26/23 2030  cefTRIAXone (ROCEPHIN) 2 g in sodium chloride 0.9 % 100 mL IVPB        2 g 200 mL/hr over 30 Minutes Intravenous Every 24 hours 05/26/23 2026 06/01/23 1845        I have personally reviewed the following labs and images: CBC: Recent Labs  Lab 06/04/23 0333 06/05/23 0137  06/07/23 0105  WBC 7.3 5.2 9.0  HGB 7.8* 7.7* 8.2*  HCT 23.6* 23.3* 24.8*  MCV 86.8 86.6 85.5  PLT 360 322 382   BMP &GFR Recent Labs  Lab 06/04/23 0333 06/07/23 0105  NA 135 133*  K 4.5 5.0  CL 100 97*  CO2 27 28  GLUCOSE 143* 211*  BUN 27* 44*  CREATININE 1.03 0.98  CALCIUM 8.4* 8.3*  MG 1.8 2.2  PHOS 3.2 2.7   Estimated Creatinine Clearance: 64.2 mL/min (by C-G formula based on SCr of 0.98 mg/dL). Liver & Pancreas: Recent Labs  Lab 06/04/23 0333 06/07/23 0105  ALBUMIN 2.0* 2.4*   No results for input(s): "LIPASE", "AMYLASE" in the last 168 hours. No results for input(s): "AMMONIA" in the last 168 hours. Diabetic: No results for input(s): "HGBA1C" in the last 72 hours. Recent Labs  Lab 06/09/23 1605 06/09/23 2037 06/09/23 2308 06/10/23 0731 06/10/23 1115  GLUCAP 133* 175* 139* 168* 136*   Cardiac Enzymes: No results for input(s): "CKTOTAL", "CKMB", "CKMBINDEX", "TROPONINI" in the last 168 hours. No results for input(s): "PROBNP" in the last 8760 hours. Coagulation Profile: No results for input(s): "INR", "PROTIME" in the last 168 hours.  Thyroid Function Tests: No results for input(s): "TSH", "T4TOTAL", "FREET4", "T3FREE", "THYROIDAB" in the last 72 hours. Lipid Profile: No results for input(s): "CHOL", "HDL", "LDLCALC", "TRIG", "CHOLHDL", "LDLDIRECT" in the last 72 hours. Anemia Panel: No results for input(s): "VITAMINB12", "FOLATE", "FERRITIN", "TIBC", "IRON", "RETICCTPCT" in the last 72 hours.  Urine analysis:    Component Value Date/Time   COLORURINE AMBER (A) 05/26/2023 2026   APPEARANCEUR CLEAR 05/26/2023 2026   APPEARANCEUR Clear 01/23/2021 1634   LABSPEC 1.020 05/26/2023 2026   PHURINE 5.0 05/26/2023 2026   GLUCOSEU NEGATIVE 05/26/2023 2026   GLUCOSEU NEGATIVE 10/01/2020 0853   HGBUR NEGATIVE 05/26/2023 2026   BILIRUBINUR NEGATIVE 05/26/2023 2026   BILIRUBINUR Negative 01/23/2021 1634   KETONESUR 5 (A) 05/26/2023 2026   PROTEINUR 100  (A) 05/26/2023 2026   UROBILINOGEN 0.2 10/01/2020 0853   NITRITE NEGATIVE 05/26/2023 2026   LEUKOCYTESUR NEGATIVE 05/26/2023 2026   Sepsis Labs: Invalid input(s): "PROCALCITONIN", "LACTICIDVEN"  Microbiology: No results found for this or any previous visit (from the past 240 hour(s)).   Radiology Studies: No results found.     T.  [facility]  If 7PM-7AM, please contact night-coverage [email or web address] 06/10/2023, 1:52 PM

## 2023-06-10 NOTE — TOC Progression Note (Addendum)
Transition of Care [facility]) - Progression Note    Patient Details  Name: [name] MRN: [id number] Date of Birth: [birth year]  [facility]) CM/SW Contact  Lorri Frederick, LCSW Phone Number: 06/10/2023, 11:29 AM  Clinical Narrative:   No response from APS social worker [name].  CSW left message with her supervisor, Rodena Medin, [phone].    TOC leadership informed of issues with contacting APS and DC plan.   Expected Discharge Plan: Home w Home Health Services Barriers to Discharge: No Barriers Identified  Expected Discharge Plan and Services       Living arrangements for the past 2 months: Single Family Home                                       Social Determinants of Health (SDOH) Interventions SDOH Screenings   Depression (PHQ2-9): Low Risk  (03/11/2023)  Tobacco Use: Low Risk  (06/02/2023)    Readmission Risk Interventions     No data to display

## 2023-06-11 LAB — GLUCOSE, CAPILLARY
Glucose-Capillary: 170 mg/dL — ABNORMAL HIGH (ref 70–99)
Glucose-Capillary: 90 mg/dL (ref 70–99)
Glucose-Capillary: 160 mg/dL — ABNORMAL HIGH (ref 70–99)
Glucose-Capillary: 114 mg/dL — ABNORMAL HIGH (ref 70–99)

## 2023-06-11 NOTE — Plan of Care (Signed)
  Problem: Education: Goal: Ability to describe self-care measures that may prevent or decrease complications (Diabetes Survival Skills Education) will improve Outcome: Progressing   Problem: Coping: Goal: Ability to adjust to condition or change in health will improve Outcome: Progressing   Problem: Fluid Volume: Goal: Ability to maintain a balanced intake and output will improve Outcome: Progressing   Problem: Health Behavior/Discharge Planning: Goal: Ability to identify and utilize available resources and services will improve Outcome: Progressing   Problem: Health Behavior/Discharge Planning: Goal: Ability to manage health-related needs will improve Outcome: Progressing   Problem: Skin Integrity: Goal: Risk for impaired skin integrity will decrease Outcome: Progressing

## 2023-06-11 NOTE — Plan of Care (Signed)
  Problem: Education: Goal: Knowledge of General Education information will improve Description: Including pain rating scale, medication(s)/side effects and non-pharmacologic comfort measures Outcome: Progressing   Problem: Health Behavior/Discharge Planning: Goal: Ability to manage health-related needs will improve Outcome: Progressing   Problem: Clinical Measurements: Goal: Will remain free from infection Outcome: Progressing   Problem: Activity: Goal: Risk for activity intolerance will decrease Outcome: Progressing   Problem: Nutrition: Goal: Adequate nutrition will be maintained Outcome: Progressing   Problem: Coping: Goal: Level of anxiety will decrease Outcome: Progressing   Problem: Safety: Goal: Ability to remain free from injury will improve Outcome: Progressing   

## 2023-06-11 NOTE — Progress Notes (Signed)
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 He was found to have L foot gangrene with osteomyelitis. Dr. Lajoyce Corners was consulted and he is scheduled for TMA on 7/28. He is on Rocephin and Vancomycin. Dr. Edilia Bo consulted, believes he has adequate circulation for surgery and recommended reconsulting if signs of poor circulation during surgery.  Underwent left TMA with wound VAC placement by Dr. Lajoyce Corners on 7/31.  He will be TDWB per ortho.  Therapy recommended home health but no safe disposition.  Per sister's report to [facility], "he was staying with some people because he couldn't stay with sister anymore due to violent behaviors. Sister was bringing food and they were letting him stay for free. And now those people are saying he can't come back and sister can't take him either".  TOC filed APS report.   Assessment and Plan:  Left foot cellulitis with gangrene/osteomyelitis -MRI with osteo of the 2nd toe and plantar muscles suggesting myositis -Vascular has consulted and there is no indication for vascular intervention at this time -S/p left TMA and wound VAC placement by Dr. Lajoyce Corners on 7/31. -Per Ortho, surgical source control achieved.  Antibiotic discontinued. -TDWB on the left -Pain control and VTE prophylaxis per surgery -PT/OT-recommended home health and DME but no safe disposition. -OOB   H/o TBI/cognitive impairment -Continue Prozac, hydroxyzine, and Risperdal   Unsafe living situation: Sister says living in a [place], unsafe living situation.  Patient with TBI/cognitive impairment.  Has no capacity to  make medical decision. -TOC filed APS report.   Controlled DM-2 with hyperglycemia: A1c 5.8%. Last Labs         Recent Labs  Lab 06/09/23 1605 06/09/23 2037 06/09/23 2308 06/10/23 0731 06/10/23 1115  GLUCAP 133* 175* 139* 168* 136*    -Continue current insulin regimen   Normocytic anemia: H&H stable.  Anemia panel suggests anemia of chronic disease. -Continue monitoring   Fall in the hospital: No apparent injury or acute focal neurodeficit. -Fall precaution   Seizure disorder  -Continue Keppra    HLD -Continue atorvastatin   HTN -Continue lisinopril   Hyponatremia: Resolved.  Body mass index is 22.02 kg/m.  Nutrition Problem: Increased nutrient needs Etiology: acute illness (L foot gangrene with osteomyelitis; wound vac) Interventions: Interventions: Refer to RD note for recommendations, Ensure Enlive (each supplement provides 350kcal and 20 grams of protein), Juven, MVI   Diet: Carb modified diet DVT Prophylaxis: Subcutaneous Lovenox   Advance goals of care discussion: Full code  Family Communication: family was not present at bedside, at the time of interview.  The pt provided permission to discuss medical plan with the family. Opportunity was given to ask question and all questions were answered satisfactorily.   Disposition:  Pt is from Home, admitted with left foot gangrene status post TMA, patient has no capacity, TOC and APS involved.  Difficult to place, patient can be discharged when arrangement will be done. Follow TOC for discharge planning   Subjective: No significant events overnight, patient was resting overnight, denied any pain.  Physical Exam: General: NAD, lying comfortably Appear in no distress, affect  appropriate Eyes: PERRLA ENT: Oral Mucosa Clear, moist  Neck: no JVD,  Cardiovascular: S1 and S2 Present, no Murmur,  Respiratory: good respiratory effort, Bilateral Air entry equal and Decreased, no Crackles, no wheezes Abdomen:  Bowel Sound present, Soft and no tenderness,  Skin: no rashes Extremities: no Pedal edema, no calf tenderness, s/p Left TMA, dressing CDI Neurologic: without any new focal findings Gait not checked due to patient safety concerns  Vitals:   06/10/23 2151 06/11/23 0326 06/11/23 0730 06/11/23 1304  BP: 114/74 110/67 (!) 113/59 116/74  Pulse: 77 75 80 81  Resp: 16 18 18 18   Temp: 98.6 F (37 C) 98.5 F (36.9 C) 98.9 F (37.2 C) 97.9 F (36.6 C)  TempSrc: Oral Oral Oral Oral  SpO2: 96% 98%  99%  Weight:        Intake/Output Summary (Last 24 hours) at 06/11/2023 1745 Last data filed at 06/11/2023 0720 Gross per 24 hour  Intake 240 ml  Output 700 ml  Net -460 ml   Filed Weights   05/26/23 2321  Weight: 54.6 kg    Data Reviewed: I have personally reviewed and interpreted daily labs, tele strips, imagings as discussed above. I reviewed all nursing notes, pharmacy notes, vitals, pertinent old records I have discussed plan of care as described above with RN and patient/family.  CBC: Recent Labs  Lab 06/05/23 0137 06/07/23 0105  WBC 5.2 9.0  HGB 7.7* 8.2*  HCT 23.3* 24.8*  MCV 86.6 85.5  PLT 322 382   Basic Metabolic Panel: Recent Labs  Lab 06/07/23 0105  NA 133*  K 5.0  CL 97*  CO2 28  GLUCOSE 211*  BUN 44*  CREATININE 0.98  CALCIUM 8.3*  MG 2.2  PHOS 2.7    Studies: No results found.  Scheduled Meds:  vitamin C  1,000 mg Oral Daily   atorvastatin  10 mg Oral Daily   docusate sodium  100 mg Oral Daily   enoxaparin (LOVENOX) injection  40 mg Subcutaneous Daily   feeding supplement  237 mL Oral BID BM   FLUoxetine  30 mg Oral Daily   insulin aspart  0-5 Units Subcutaneous QHS   insulin aspart  0-9 Units Subcutaneous TID WC   insulin glargine-yfgn  10 Units Subcutaneous QHS   levETIRAcetam  500 mg Oral BID   lisinopril  2.5 mg Oral Daily   multivitamins with iron  1 tablet Oral Daily   nutrition supplement (JUVEN)  1 packet Oral BID BM   pantoprazole  40  mg Oral Daily   risperiDONE  0.25 mg Oral QHS   zinc sulfate  220 mg Oral Daily   Continuous Infusions: PRN Meds: acetaminophen **OR** acetaminophen, alum & mag hydroxide-simeth, bisacodyl, guaiFENesin-dextromethorphan, hydrALAZINE, HYDROmorphone (DILAUDID) injection, labetalol, loperamide, magnesium citrate, metoprolol tartrate, ondansetron **OR** ondansetron (ZOFRAN) IV, oxyCODONE, oxyCODONE, phenol, polyethylene glycol, senna-docusate  Time spent: 35 minutes  Author: Gillis Santa. MD [facility] 06/11/2023 5:45 PM  To reach On-call, see care teams to locate the attending and reach out to them via [email or web address]. If 7PM-7AM, please contact night-coverage If you still have difficulty reaching the attending provider, please page the [facility] (Director on Call) for [facility] on amion for assistance.

## 2023-06-11 NOTE — Progress Notes (Signed)
Mobility Specialist Progress Note    06/11/23 1315  Mobility  Activity Stood at bedside;Dangled on edge of bed  Level of Assistance Minimal assist, patient does 75% or more  Assistive Device Front wheel walker (HHA)  Range of Motion/Exercises Active;All extremities  LLE Weight Bearing TWB  Activity Response Tolerated fair   Patient received in supine and agreeable to participate. Completed bed mobility with minimal A to assist with trunk elevation from supine>sit. Needed more assistance to stand, and could only tolerate standing for a few seconds before needing seated rest. Completed STS x2. Could not recite WB precaution but did well to adhering upon standing. For safety, opted to keep patient in bed instead of transferring to chair. Returned to supine without complaint or incident. Was left with all needs met, call bell in reach.   [place] , BS EXP Mobility Specialist Please contact via SecureChat or Rehab office at [phone]

## 2023-06-11 NOTE — Plan of Care (Signed)
  Problem: Education: Goal: Ability to describe self-care measures that may prevent or decrease complications (Diabetes Survival Skills Education) will improve Outcome: Progressing   Problem: Coping: Goal: Ability to adjust to condition or change in health will improve Outcome: Progressing   Problem: Metabolic: Goal: Ability to maintain appropriate glucose levels will improve Outcome: Progressing   Problem: Tissue Perfusion: Goal: Adequacy of tissue perfusion will improve Outcome: Progressing   Problem: Nutrition: Goal: Adequate nutrition will be maintained Outcome: Progressing   Problem: Activity: Goal: Risk for activity intolerance will decrease Outcome: Progressing

## 2023-06-11 NOTE — TOC Progression Note (Signed)
Transition of Care [facility]) - Progression Note    Patient Details  Name: Thien Sia MRN: [id number] Date of Birth: [birth year]  [facility] CM/SW Contact  Lorri Frederick, LCSW Phone Number: 06/11/2023, 2:34 PM  Clinical Narrative:   Another message left with Rodena Medin at APS.  No responses from anyone that CSW is aware of.     Expected Discharge Plan: Home w Home Health Services Barriers to Discharge: No Barriers Identified  Expected Discharge Plan and Services       Living arrangements for the past 2 months: Single Family Home                                       Social Determinants of Health (SDOH) Interventions SDOH Screenings   Depression (PHQ2-9): Low Risk  (03/11/2023)  Tobacco Use: Low Risk  (06/02/2023)    Readmission Risk Interventions     No data to display

## 2023-06-12 LAB — GLUCOSE, CAPILLARY
Glucose-Capillary: 184 mg/dL — ABNORMAL HIGH (ref 70–99)
Glucose-Capillary: 123 mg/dL — ABNORMAL HIGH (ref 70–99)
Glucose-Capillary: 161 mg/dL — ABNORMAL HIGH (ref 70–99)
Glucose-Capillary: 131 mg/dL — ABNORMAL HIGH (ref 70–99)

## 2023-06-12 NOTE — Progress Notes (Signed)
Progress Note    [name]  WJX:[id number] DOB: 1966-09-23  DOA: 05/26/2023 PCP: Marcine Matar, MD      Brief Narrative:    Medical records reviewed and are as summarized below:  [name] is a 57 y.o. 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 He was found to have L foot gangrene with osteomyelitis. Dr. Lajoyce Corners was consulted and he is scheduled for TMA on 7/28. He is on Rocephin and Vancomycin. Dr. Edilia Bo consulted, believes he has adequate circulation for surgery and recommended reconsulting if signs of poor circulation during surgery. Underwent left TMA with wound VAC placement by Dr. Lajoyce Corners on 7/31. He will be TDWB per ortho. Therapy recommended home health but no safe disposition. Per sister's report to [facility], "he was staying with some people because he couldn't stay with sister anymore due to violent behaviors. Sister was bringing food and they were letting him stay for free. And now those people are saying he can't come back and sister can't take him either". TOC filed APS report.      Assessment/Plan:   Principal Problem:   Cellulitis of left foot Active Problems:   Essential hypertension   Type 2 diabetes mellitus with peripheral neuropathy (HCC)   Seizure disorder (HCC)   MDD (major depressive disorder), recurrent episode, moderate (HCC)   History of traumatic brain injury   Cutaneous abscess of left foot   PAD (peripheral artery disease) (HCC)   Severe protein-calorie malnutrition (HCC)   Nutrition Problem: Increased nutrient needs Etiology: acute illness (L foot gangrene with osteomyelitis; wound vac)  Signs/Symptoms: estimated needs   Body mass index is 22.02 kg/m.    Left foot cellulitis with gangrene/osteomyelitis -MRI with osteo of the 2nd toe and plantar muscles suggesting  myositis He was seen by the vascular surgeon and there is no indication for vascular intervention. -S/p left TMA and wound VAC placement by Dr. Lajoyce Corners on 7/31. -Per Ortho, surgical source control achieved.  Antibiotic discontinued. -TDWB on the left Analgesics as needed for pain. -PT/OT-recommended home health and DME but no safe disposition.    H/o TBI/cognitive impairment -Continue Prozac, hydroxyzine, and Risperdal   Unsafe living situation: Sister says living in a [place], unsafe living situation.  Patient with TBI/cognitive impairment.  Has no capacity to make medical decision. -TOC filed APS report.   Controlled DM-2 with hyperglycemia:  Continue insulin glargine and NovoLog as needed.  Hemoglobin A1c was 5.8%.  Normocytic anemia: H&H stable.  Anemia panel suggests anemia of chronic disease. -Continue monitoring   Fall in the hospital: No apparent injury or acute focal neurodeficit. -Fall precaution   Seizure disorder  -Continue Keppra      Recurrent hyponatremia: Asymptomatic    Other comorbidities include hyperlipidemia, hypertension     Diet Order             Diet Carb Modified Fluid consistency: Thin; Room service appropriate? 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    Medications:    vitamin C  1,000 mg Oral Daily   atorvastatin  10 mg Oral Daily   docusate sodium  100 mg  Oral Daily   enoxaparin (LOVENOX) injection  40 mg Subcutaneous Daily   feeding supplement  237 mL Oral BID BM   FLUoxetine  30 mg Oral Daily   insulin aspart  0-5 Units Subcutaneous QHS   insulin aspart  0-9 Units Subcutaneous TID WC   insulin glargine-yfgn  10 Units Subcutaneous QHS   levETIRAcetam  500 mg Oral BID   lisinopril  2.5 mg Oral Daily   multivitamins with iron  1 tablet Oral Daily   nutrition supplement (JUVEN)  1 packet Oral BID BM    pantoprazole  40 mg Oral Daily   risperiDONE  0.25 mg Oral QHS   zinc sulfate  220 mg Oral Daily   Continuous Infusions:   Anti-infectives (From admission, onward)    Start     Dose/Rate Route Frequency Ordered Stop   06/03/23 0600  ceFAZolin (ANCEF) IVPB 2g/100 mL premix        2 g 200 mL/hr over 30 Minutes Intravenous On call to O.R. 06/02/23 1149 06/02/23 1330   06/02/23 2200  ceFAZolin (ANCEF) IVPB 2g/100 mL premix        2 g 200 mL/hr over 30 Minutes Intravenous Every 8 hours 06/02/23 1514 06/03/23 0639   06/02/23 1142  ceFAZolin (ANCEF) 2-4 GM/100ML-% IVPB       Note to Pharmacy: [facility], [name]: cabinet override      06/02/23 1142 06/02/23 1330   06/01/23 1715  metroNIDAZOLE (FLAGYL) IVPB 500 mg  Status:  Discontinued        500 mg 100 mL/hr over 60 Minutes Intravenous Every 12 hours 06/01/23 1615 06/02/23 1515   05/30/23 0800  ceFAZolin (ANCEF) IVPB 2g/100 mL premix        2 g 200 mL/hr over 30 Minutes Intravenous On call to O.R. 05/30/23 0412 05/30/23 1209   05/28/23 1500  vancomycin (VANCOREADY) IVPB 1250 mg/250 mL  Status:  Discontinued        1,250 mg 166.7 mL/hr over 90 Minutes Intravenous Every 24 hours 05/28/23 1211 06/02/23 1515   05/28/23 1145  vancomycin (VANCOREADY) IVPB 1250 mg/250 mL  Status:  Discontinued        1,250 mg 166.7 mL/hr over 90 Minutes Intravenous Every 24 hours 05/28/23 1051 05/28/23 1211   05/26/23 2030  cefTRIAXone (ROCEPHIN) 2 g in sodium chloride 0.9 % 100 mL IVPB        2 g 200 mL/hr over 30 Minutes Intravenous Every 24 hours 05/26/23 2026 06/01/23 1845              Family Communication/Anticipated D/C date and plan/Code Status   DVT prophylaxis: SCD's Start: 06/02/23 1515 enoxaparin (LOVENOX) injection 40 mg Start: 05/27/23 1000     Code Status: Full Code  Family Communication: None Disposition Plan: Plan to discharge to SNF   Status is: Inpatient Remains inpatient appropriate because: Awaiting placement to  SNF       Subjective:   Interval events noted.  No complaints.  Objective:    Vitals:   06/11/23 1304 06/11/23 1959 06/12/23 0818 06/12/23 0837  BP: 116/74 97/70 113/74 113/68  Pulse: 81 80  75  Resp: 18 15  17   Temp: 97.9 F (36.6 C) 98.3 F (36.8 C)  97.8 F (36.6 C)  TempSrc: Oral Oral    SpO2: 99% 98%  97%  Weight:       No data found.   Intake/Output Summary (Last 24 hours) at 06/12/2023 1443 Last data filed at 06/12/2023 1357 Gross per  24 hour  Intake 120 ml  Output 1226 ml  Net -1106 ml   Filed Weights   05/26/23 2321  Weight: 54.6 kg    Exam:  GEN: NAD SKIN: Warm and dry EYES: No pallor or icterus ENT: MMM CV: RRR PULM: CTA B ABD: soft, ND, NT, +BS CNS: AAO x 2 (person and place), non focal EXT: Dressing on left foot is clean, dry and intact        Data Reviewed:   I have personally reviewed following labs and imaging studies:  Labs: Labs show the following:   Basic Metabolic Panel: Recent Labs  Lab 06/07/23 0105 06/12/23 0320  NA 133* 131*  K 5.0 4.5  CL 97* 98  CO2 28 26  GLUCOSE 211* 178*  BUN 44* 35*  CREATININE 0.98 0.83  CALCIUM 8.3* 8.3*  MG 2.2 1.9  PHOS 2.7 3.7   GFR Estimated Creatinine Clearance: 75.8 mL/min (by C-G formula based on SCr of 0.83 mg/dL). Liver Function Tests: Recent Labs  Lab 06/07/23 0105  ALBUMIN 2.4*   No results for input(s): "LIPASE", "AMYLASE" in the last 168 hours. No results for input(s): "AMMONIA" in the last 168 hours. Coagulation profile No results for input(s): "INR", "PROTIME" in the last 168 hours.  CBC: Recent Labs  Lab 06/07/23 0105 06/12/23 0320  WBC 9.0 6.1  HGB 8.2* 8.3*  HCT 24.8* 24.9*  MCV 85.5 89.6  PLT 382 280   Cardiac Enzymes: No results for input(s): "CKTOTAL", "CKMB", "CKMBINDEX", "TROPONINI" in the last 168 hours. BNP (last 3 results) No results for input(s): "PROBNP" in the last 8760 hours. CBG: Recent Labs  Lab 06/11/23 1116 06/11/23 1614  06/11/23 2105 06/12/23 0836 06/12/23 1300  GLUCAP 90 160* 114* 184* 123*   D-Dimer: No results for input(s): "DDIMER" in the last 72 hours. Hgb A1c: No results for input(s): "HGBA1C" in the last 72 hours. Lipid Profile: No results for input(s): "CHOL", "HDL", "LDLCALC", "TRIG", "CHOLHDL", "LDLDIRECT" in the last 72 hours. Thyroid function studies: No results for input(s): "TSH", "T4TOTAL", "T3FREE", "THYROIDAB" in the last 72 hours.  Invalid input(s): "FREET3" Anemia work up: No results for input(s): "VITAMINB12", "FOLATE", "FERRITIN", "TIBC", "IRON", "RETICCTPCT" in the last 72 hours. Sepsis Labs: Recent Labs  Lab 06/07/23 0105 06/12/23 0320  WBC 9.0 6.1    Microbiology No results found for this or any previous visit (from the past 240 hour(s)).  Procedures and diagnostic studies:  No results found.             LOS: 17 days      [employer] loss adjuster on [email or web address]. If 7PM-7AM, please contact night-coverage at [email or web address]     06/12/2023, 2:43 PM

## 2023-06-12 NOTE — Plan of Care (Signed)
  Problem: Coping: Goal: Ability to adjust to condition or change in health will improve Outcome: Progressing   Problem: Health Behavior/Discharge Planning: Goal: Ability to manage health-related needs will improve Outcome: Progressing   Problem: Metabolic: Goal: Ability to maintain appropriate glucose levels will improve Outcome: Progressing   Problem: Nutritional: Goal: Maintenance of adequate nutrition will improve Outcome: Progressing   Problem: Tissue Perfusion: Goal: Adequacy of tissue perfusion will improve Outcome: Progressing

## 2023-06-13 LAB — GLUCOSE, CAPILLARY
Glucose-Capillary: 155 mg/dL — ABNORMAL HIGH (ref 70–99)
Glucose-Capillary: 87 mg/dL (ref 70–99)
Glucose-Capillary: 123 mg/dL — ABNORMAL HIGH (ref 70–99)

## 2023-06-13 NOTE — Plan of Care (Signed)
  Problem: Nutritional: Goal: Maintenance of adequate nutrition will improve Outcome: Completed/Met

## 2023-06-13 NOTE — Progress Notes (Signed)
Progress Note    Worth Buckels  VWU:[id number] DOB: 1966-06-29  DOA: 05/26/2023 PCP: Marcine Matar, MD      Brief Narrative:    Medical records reviewed and are as summarized below:  [name] is a 57 y.o. 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 He was found to have L foot gangrene with osteomyelitis. Dr. Lajoyce Corners was consulted and he is scheduled for TMA on 7/28. He is on Rocephin and Vancomycin. Dr. Edilia Bo consulted, believes he has adequate circulation for surgery and recommended reconsulting if signs of poor circulation during surgery. Underwent left TMA with wound VAC placement by Dr. Lajoyce Corners on 7/31. He will be TDWB per ortho. Therapy recommended home health but no safe disposition. Per sister's report to [facility], "he was staying with some people because he couldn't stay with sister anymore due to violent behaviors. Sister was bringing food and they were letting him stay for free. And now those people are saying he can't come back and sister can't take him either". TOC filed APS report.      Assessment/Plan:   Principal Problem:   Cellulitis of left foot Active Problems:   Essential hypertension   Type 2 diabetes mellitus with peripheral neuropathy (HCC)   Seizure disorder (HCC)   MDD (major depressive disorder), recurrent episode, moderate (HCC)   History of traumatic brain injury   Cutaneous abscess of left foot   PAD (peripheral artery disease) (HCC)   Severe protein-calorie malnutrition (HCC)   Nutrition Problem: Increased nutrient needs Etiology: acute illness (L foot gangrene with osteomyelitis; wound vac)  Signs/Symptoms: estimated needs   Body mass index is 22.02 kg/m.    Left foot cellulitis with gangrene/osteomyelitis -MRI with osteo of the 2nd toe and plantar muscles suggesting  myositis He was seen by the vascular surgeon and there is no indication for vascular intervention. -S/p left TMA and wound VAC placement by Dr. Lajoyce Corners on 7/31. -Per Ortho, surgical source control achieved.  Antibiotic discontinued. -TDWB on the left Analgesics as needed for pain. -PT/OT-recommended home health and DME but no safe disposition.    H/o TBI/cognitive impairment Continue Risperdal, Prozac and hydroxyzine   Unsafe living situation: Sister says living in a [place], unsafe living situation.  Patient with TBI/cognitive impairment.  Has no capacity to make medical decision. -TOC filed APS report.   Controlled DM-2 with hyperglycemia:  Continue insulin glargine and NovoLog as needed.  Hemoglobin A1c was 5.8%.  Normocytic anemia: H&H stable.  Anemia panel suggests anemia of chronic disease.   Fall in the hospital: No apparent injury or acute focal neurodeficit. -Fall precaution   Seizure disorder  Continue Keppra 500 mg twice daily     Recurrent hyponatremia: Asymptomatic    Other comorbidities include hyperlipidemia, hypertension     Diet Order             Diet Carb Modified Fluid consistency: Thin; Room service appropriate? 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    Medications:    vitamin C  1,000 mg Oral Daily   atorvastatin  10 mg Oral Daily   docusate sodium  100  mg Oral Daily   enoxaparin (LOVENOX) injection  40 mg Subcutaneous Daily   feeding supplement  237 mL Oral BID BM   FLUoxetine  30 mg Oral Daily   insulin aspart  0-5 Units Subcutaneous QHS   insulin aspart  0-9 Units Subcutaneous TID WC   insulin glargine-yfgn  10 Units Subcutaneous QHS   levETIRAcetam  500 mg Oral BID   lisinopril  2.5 mg Oral Daily   multivitamins with iron  1 tablet Oral Daily   nutrition supplement (JUVEN)  1 packet Oral BID BM    pantoprazole  40 mg Oral Daily   risperiDONE  0.25 mg Oral QHS   zinc sulfate  220 mg Oral Daily   Continuous Infusions:   Anti-infectives (From admission, onward)    Start     Dose/Rate Route Frequency Ordered Stop   06/03/23 0600  ceFAZolin (ANCEF) IVPB 2g/100 mL premix        2 g 200 mL/hr over 30 Minutes Intravenous On call to O.R. 06/02/23 1149 06/02/23 1330   06/02/23 2200  ceFAZolin (ANCEF) IVPB 2g/100 mL premix        2 g 200 mL/hr over 30 Minutes Intravenous Every 8 hours 06/02/23 1514 06/03/23 0639   06/02/23 1142  ceFAZolin (ANCEF) 2-4 GM/100ML-% IVPB       Note to Pharmacy: [facility], [name]: cabinet override      06/02/23 1142 06/02/23 1330   06/01/23 1715  metroNIDAZOLE (FLAGYL) IVPB 500 mg  Status:  Discontinued        500 mg 100 mL/hr over 60 Minutes Intravenous Every 12 hours 06/01/23 1615 06/02/23 1515   05/30/23 0800  ceFAZolin (ANCEF) IVPB 2g/100 mL premix        2 g 200 mL/hr over 30 Minutes Intravenous On call to O.R. 05/30/23 0412 05/30/23 1209   05/28/23 1500  vancomycin (VANCOREADY) IVPB 1250 mg/250 mL  Status:  Discontinued        1,250 mg 166.7 mL/hr over 90 Minutes Intravenous Every 24 hours 05/28/23 1211 06/02/23 1515   05/28/23 1145  vancomycin (VANCOREADY) IVPB 1250 mg/250 mL  Status:  Discontinued        1,250 mg 166.7 mL/hr over 90 Minutes Intravenous Every 24 hours 05/28/23 1051 05/28/23 1211   05/26/23 2030  cefTRIAXone (ROCEPHIN) 2 g in sodium chloride 0.9 % 100 mL IVPB        2 g 200 mL/hr over 30 Minutes Intravenous Every 24 hours 05/26/23 2026 06/01/23 1845              Family Communication/Anticipated D/C date and plan/Code Status   DVT prophylaxis: SCD's Start: 06/02/23 1515 enoxaparin (LOVENOX) injection 40 mg Start: 05/27/23 1000     Code Status: Full Code  Family Communication: None Disposition Plan: Plan to discharge to SNF   Status is: Inpatient Remains inpatient appropriate because: Awaiting placement to  SNF       Subjective:   No acute events overnight.  He has no complaints.  Objective:    Vitals:   06/12/23 1703 06/12/23 2134 06/13/23 0345 06/13/23 0712  BP: 121/69 100/62 118/73 114/67  Pulse: 75 78 78 77  Resp: 18 17 17 18   Temp: 98.2 F (36.8 C) 98.2 F (36.8 C) 98.6 F (37 C) 97.7 F (36.5 C)  TempSrc: Oral Oral  Oral  SpO2: 99% 96% 100% 100%  Weight:       No data found.   Intake/Output Summary (Last 24 hours) at 06/13/2023 1213 Last  data filed at 06/13/2023 0654 Gross per 24 hour  Intake 240 ml  Output 1875 ml  Net -1635 ml   Filed Weights   05/26/23 2321  Weight: 54.6 kg    Exam:  GEN: NAD SKIN: No rash EYES: EOMI ENT: MMM CV: RRR PULM: CTA B ABD: soft, ND, NT, +BS CNS: AAO x 2 (person and place), non focal EXT: No edema or tenderness.  Dressing on right foot is clean, dry and intact       Data Reviewed:   I have personally reviewed following labs and imaging studies:  Labs: Labs show the following:   Basic Metabolic Panel: Recent Labs  Lab 06/07/23 0105 06/12/23 0320  NA 133* 131*  K 5.0 4.5  CL 97* 98  CO2 28 26  GLUCOSE 211* 178*  BUN 44* 35*  CREATININE 0.98 0.83  CALCIUM 8.3* 8.3*  MG 2.2 1.9  PHOS 2.7 3.7   GFR Estimated Creatinine Clearance: 75.8 mL/min (by C-G formula based on SCr of 0.83 mg/dL). Liver Function Tests: Recent Labs  Lab 06/07/23 0105  ALBUMIN 2.4*   No results for input(s): "LIPASE", "AMYLASE" in the last 168 hours. No results for input(s): "AMMONIA" in the last 168 hours. Coagulation profile No results for input(s): "INR", "PROTIME" in the last 168 hours.  CBC: Recent Labs  Lab 06/07/23 0105 06/12/23 0320  WBC 9.0 6.1  HGB 8.2* 8.3*  HCT 24.8* 24.9*  MCV 85.5 89.6  PLT 382 280   Cardiac Enzymes: No results for input(s): "CKTOTAL", "CKMB", "CKMBINDEX", "TROPONINI" in the last 168 hours. BNP (last 3 results) No results for input(s): "PROBNP" in the last 8760 hours. CBG: Recent  Labs  Lab 06/12/23 0836 06/12/23 1300 06/12/23 1654 06/12/23 2035 06/13/23 0710  GLUCAP 184* 123* 161* 131* 155*   D-Dimer: No results for input(s): "DDIMER" in the last 72 hours. Hgb A1c: No results for input(s): "HGBA1C" in the last 72 hours. Lipid Profile: No results for input(s): "CHOL", "HDL", "LDLCALC", "TRIG", "CHOLHDL", "LDLDIRECT" in the last 72 hours. Thyroid function studies: No results for input(s): "TSH", "T4TOTAL", "T3FREE", "THYROIDAB" in the last 72 hours.  Invalid input(s): "FREET3" Anemia work up: No results for input(s): "VITAMINB12", "FOLATE", "FERRITIN", "TIBC", "IRON", "RETICCTPCT" in the last 72 hours. Sepsis Labs: Recent Labs  Lab 06/07/23 0105 06/12/23 0320  WBC 9.0 6.1    Microbiology No results found for this or any previous visit (from the past 240 hour(s)).  Procedures and diagnostic studies:  No results found.             LOS: 18 days      [employer] loss adjuster on [email or web address]. If 7PM-7AM, please contact night-coverage at [email or web address]     06/13/2023, 12:13 PM

## 2023-06-14 LAB — GLUCOSE, CAPILLARY
Glucose-Capillary: 110 mg/dL — ABNORMAL HIGH (ref 70–99)
Glucose-Capillary: 173 mg/dL — ABNORMAL HIGH (ref 70–99)
Glucose-Capillary: 158 mg/dL — ABNORMAL HIGH (ref 70–99)
Glucose-Capillary: 152 mg/dL — ABNORMAL HIGH (ref 70–99)

## 2023-06-14 NOTE — Progress Notes (Signed)
PROGRESS NOTE    [name]  QIO:[id number] DOB: [birth year] DOA: 05/26/2023 PCP: Marcine Matar, MD   Brief Narrative:   57 y.o. 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  He underwent left TMA with wound VAC by Dr. Lajoyce Corners on 06/02/2023.  Therapy recommended home health but patient has no safe disposition. Per sister's report to [facility], "he was staying with some people because he couldn't stay with sister anymore due to violent behaviors. Sister was bringing food and they were letting him stay for free. And now those people are saying he can't come back and sister can't take him either". TOC filed APS report.    Assessment & Plan:   Left foot cellulitis with gangrene/osteomyelitis -MRI with osteo of the 2nd toe and plantar muscles suggesting myositis He was seen by the vascular surgeon and there was no indication for vascular intervention. -S/p left TMA and wound VAC placement by Dr. Lajoyce Corners on 7/31. -Per Ortho, surgical source control achieved.  Antibiotic discontinued. -Analgesics as needed for pain. -PT/OT-recommended home health and DME but no safe disposition.     H/o TBI/cognitive impairment and intermittent aggressive behaviors Depression -Continue Risperdal, Prozac and hydroxyzine -Consult psychiatry for the same and also for capacity evaluation.   Unsafe living situation: Sister says living in a [place], unsafe living situation.  Patient with TBI/cognitive impairment.  Has no capacity to make medical decision. -TOC filed APS report.   Controlled DM-2 with hyperglycemia:  -Continue insulin glargine and NovoLog as needed.  Hemoglobin A1c was 5.8%.   Normocytic anemia: H&H stable.  Anemia panel suggests anemia of chronic disease.  Monitor intermittently.   Fall in the hospital: No apparent injury  or acute focal neurodeficit. -Fall precaution   Seizure disorder  Continue Keppra 500 mg twice daily     Recurrent hyponatremia: Asymptomatic.  Monitor intermittently  Hypertension -Blood pressure stable.  Continue lisinopril.  Hyperlipidemia -Continue statin   DVT prophylaxis: Lovenox Code Status: Full Family Communication: None at bedside Disposition Plan: Status is: Inpatient Remains inpatient appropriate because: Of severity of illness.  Unsafe disposition plan    Consultants: Orthopedic/vascular surgery/psychiatry  Procedures: As above  Antimicrobials:  Anti-infectives (From admission, onward)    Start     Dose/Rate Route Frequency Ordered Stop   06/03/23 0600  ceFAZolin (ANCEF) IVPB 2g/100 mL premix        2 g 200 mL/hr over 30 Minutes Intravenous On call to O.R. 06/02/23 1149 06/02/23 1330   06/02/23 2200  ceFAZolin (ANCEF) IVPB 2g/100 mL premix        2 g 200 mL/hr over 30 Minutes Intravenous Every 8 hours 06/02/23 1514 06/03/23 0639   06/02/23 1142  ceFAZolin (ANCEF) 2-4 GM/100ML-% IVPB       Note to Pharmacy: [facility], [name]: cabinet override      06/02/23 1142 06/02/23 1330   06/01/23 1715  metroNIDAZOLE (FLAGYL) IVPB 500 mg  Status:  Discontinued        500 mg 100 mL/hr over 60 Minutes Intravenous Every 12 hours 06/01/23 1615 06/02/23 1515   05/30/23 0800  ceFAZolin (ANCEF) IVPB 2g/100 mL premix        2 g 200 mL/hr over 30 Minutes Intravenous On call to O.R. 05/30/23 0412 05/30/23 1209   05/28/23 1500  vancomycin (VANCOREADY) IVPB 1250 mg/250 mL  Status:  Discontinued        1,250 mg 166.7 mL/hr over 90 Minutes Intravenous Every 24 hours 05/28/23 1211 06/02/23 1515   05/28/23 1145  vancomycin (VANCOREADY) IVPB 1250 mg/250 mL  Status:  Discontinued        1,250 mg 166.7 mL/hr over 90 Minutes Intravenous Every 24 hours 05/28/23 1051 05/28/23 1211   05/26/23 2030  cefTRIAXone (ROCEPHIN) 2 g in sodium chloride 0.9 % 100 mL IVPB        2 g 200 mL/hr over  30 Minutes Intravenous Every 24 hours 05/26/23 2026 06/01/23 1845        Subjective: Patient seen and examined at bedside.  Awake, slow to respond, poor historian.  No seizures, vomiting or agitation reported.  Objective: Vitals:   06/13/23 1411 06/13/23 1922 06/14/23 0450 06/14/23 0748  BP: 124/74 108/70 (!) 111/55 118/67  Pulse: 74 80 79 84  Resp: 17 16 16 18   Temp: 98 F (36.7 C) 98.3 F (36.8 C)  98.4 F (36.9 C)  TempSrc: Oral Oral  Oral  SpO2: 100% 97% 100% 98%  Weight:        Intake/Output Summary (Last 24 hours) at 06/14/2023 1054 Last data filed at 06/14/2023 0524 Gross per 24 hour  Intake --  Output 750 ml  Net -750 ml   Filed Weights   05/26/23 2321  Weight: 54.6 kg    Examination:  General exam: Appears calm and comfortable.  Looks chronically ill and deconditioned.  On room air.  Awake, slow to respond, poor historian.  Flat affect. Respiratory system: Bilateral decreased breath sounds at bases with scattered crackles Cardiovascular system: S1 & S2 heard, Rate controlled Gastrointestinal system: Abdomen is nondistended, soft and nontender. Normal bowel sounds heard. Extremities: No cyanosis, clubbing, edema.  Left foot dressing present   Data Reviewed: I have personally reviewed following labs and imaging studies  CBC: Recent Labs  Lab 06/12/23 0320  WBC 6.1  HGB 8.3*  HCT 24.9*  MCV 89.6  PLT 280   Basic Metabolic Panel: Recent Labs  Lab 06/12/23 0320  NA 131*  K 4.5  CL 98  CO2 26  GLUCOSE 178*  BUN 35*  CREATININE 0.83  CALCIUM 8.3*  MG 1.9  PHOS 3.7   GFR: Estimated Creatinine Clearance: 75.8 mL/min (by C-G formula based on SCr of 0.83 mg/dL). Liver Function Tests: No results for input(s): "AST", "ALT", "ALKPHOS", "BILITOT", "PROT", "ALBUMIN" in the last 168 hours. No results for input(s): "LIPASE", "AMYLASE" in the last 168 hours. No results for input(s): "AMMONIA" in the last 168 hours. Coagulation Profile: No results for  input(s): "INR", "PROTIME" in the last 168 hours. Cardiac Enzymes: No results for input(s): "CKTOTAL", "CKMB", "CKMBINDEX", "TROPONINI" in the last 168 hours. BNP (last 3 results) No results for input(s): "PROBNP" in the last 8760 hours. HbA1C: No results for input(s): "HGBA1C" in the last 72 hours. CBG: Recent Labs  Lab 06/12/23 2035 06/13/23 0710 06/13/23 1608 06/13/23 2108 06/14/23 0746  GLUCAP 131* 155* 87 123* 110*   Lipid Profile: No results for input(s): "CHOL", "HDL", "LDLCALC", "TRIG", "CHOLHDL", "LDLDIRECT" in the last 72 hours. Thyroid Function Tests: No results for input(s): "TSH", "T4TOTAL", "FREET4", "T3FREE", "THYROIDAB" in the last 72 hours. Anemia Panel: No results for input(s): "VITAMINB12", "FOLATE", "FERRITIN", "TIBC", "IRON", "RETICCTPCT" in the last 72 hours. Sepsis Labs: No results for input(s): "PROCALCITON", "LATICACIDVEN" in the last 168 hours.  No results found for this or any previous visit (from the past 240 hour(s)).  Radiology Studies: No results found.      Scheduled Meds:  vitamin C  1,000 mg Oral Daily   atorvastatin  10 mg Oral Daily   docusate sodium  100 mg Oral Daily   enoxaparin (LOVENOX) injection  40 mg Subcutaneous Daily   feeding supplement  237 mL Oral BID BM   FLUoxetine  30 mg Oral Daily   insulin aspart  0-5 Units Subcutaneous QHS   insulin aspart  0-9 Units Subcutaneous TID WC   insulin glargine-yfgn  10 Units Subcutaneous QHS   levETIRAcetam  500 mg Oral BID   lisinopril  2.5 mg Oral Daily   multivitamins with iron  1 tablet Oral Daily   nutrition supplement (JUVEN)  1 packet Oral BID BM   pantoprazole  40 mg Oral Daily   risperiDONE  0.25 mg Oral QHS   zinc sulfate  220 mg Oral Daily   Continuous Infusions:        Glade Lloyd, MD [facility] 06/14/2023, 10:54 AM

## 2023-06-14 NOTE — Progress Notes (Signed)
Physical Therapy Treatment Patient Details Name: [name] MRN: [id number] DOB: 03-17-1966 Today's Date: 06/14/2023   History of Present Illness Pt is a 57 year old male admitted for evaluation of being unable to get up and ambulate, and not eating for 2 days. 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    PT Comments  Pt with similar presentation to previous sessions. Pt agreeable to stand but declined further mobility for unspecified reasons. No change in DC/DME recs at this time. PT will continue to follow.   If plan is discharge home, recommend the following: A little help with walking and/or transfers;A little help with bathing/dressing/bathroom;Assistance with cooking/housework;Direct supervision/assist for medications management;Assist for transportation;Help with stairs or ramp for entrance   Can travel by private vehicle     No  Equipment Recommendations  Other (comment) (Per accepting facility)    Recommendations for Other Services       Precautions / Restrictions Precautions Precautions: Fall Restrictions Weight Bearing Restrictions: Yes LLE Weight Bearing: Touchdown weight bearing     Mobility  Bed Mobility Overal bed mobility: Modified Independent             General bed mobility comments: Pt once again able to perform bed mobility independently however will immediately lay down upon standing.    Transfers Overall transfer level: Needs assistance Equipment used: 1 person hand held assist Transfers: Sit to/from Stand Sit to Stand: Min assist           General transfer comment: Pt agreeable to stand however immediately laid back down upon standing. Pt refused further mobility.    Ambulation/Gait               General Gait Details: Pt refused   Stairs             Wheelchair  Mobility     Tilt Bed    Modified Rankin (Stroke Patients Only)       Balance Overall balance assessment: Needs assistance Sitting-balance support: No upper extremity supported, Feet supported Sitting balance-Leahy Scale: Normal     Standing balance support: Bilateral upper extremity supported, During functional activity Standing balance-Leahy Scale: Poor Standing balance comment: Reliant on external support.                            Cognition Arousal: Alert Behavior During Therapy: Flat affect, Impulsive Overall Cognitive Status: History of cognitive impairments - at baseline                                 General Comments: Per chart pt has history of TBI with residual deficits. Difficulty following commands at times.        Exercises      General Comments General comments (skin integrity, edema, etc.): VSS      Pertinent Vitals/Pain Pain Assessment Pain Assessment: Faces Faces Pain Scale: Hurts a little bit Pain Location: L Foot Pain Descriptors / Indicators: Grimacing, Discomfort Pain Intervention(s): Monitored during session, Repositioned, Limited activity within patient's tolerance    Home Living                          Prior Function            PT Goals (current goals can  now be found in the care plan section) Progress towards PT goals: Progressing toward goals    Frequency    Min 1X/week      PT Plan Current plan remains appropriate    Co-evaluation              AM-PAC PT "6 Clicks" Mobility   Outcome Measure  Help needed turning from your back to your side while in a flat bed without using bedrails?: None Help needed moving from lying on your back to sitting on the side of a flat bed without using bedrails?: None Help needed moving to and from a bed to a chair (including a wheelchair)?: A Little Help needed standing up from a chair using your arms (e.g., wheelchair or bedside chair)?: A  Little Help needed to walk in hospital room?: A Lot Help needed climbing 3-5 steps with a railing? : Total 6 Click Score: 17    End of Session Equipment Utilized During Treatment: Gait belt Activity Tolerance: Other (comment) (Limited by cognitive deficits.) Patient left: in bed;with call bell/phone within reach;with bed alarm set Nurse Communication: Mobility status PT Visit Diagnosis: Other abnormalities of gait and mobility (R26.89)     Time: 1610-9604 PT Time Calculation (min) (ACUTE ONLY): 8 min  Charges:    $Therapeutic Activity: 8-22 mins PT General Charges $$ ACUTE PT VISIT: 1 Visit                     Shela Nevin, PT, DPT Acute Rehab Services [phone]    [name] 06/14/2023, 3:53 PM

## 2023-06-14 NOTE — Plan of Care (Signed)
  Problem: Metabolic: Goal: Ability to maintain appropriate glucose levels will improve Outcome: Progressing   Problem: Nutritional: Goal: Progress toward achieving an optimal weight will improve Outcome: Progressing   Problem: Tissue Perfusion: Goal: Adequacy of tissue perfusion will improve Outcome: Progressing   Problem: Clinical Measurements: Goal: Will remain free from infection Outcome: Progressing Goal: Cardiovascular complication will be avoided Outcome: Progressing   Problem: Nutrition: Goal: Adequate nutrition will be maintained Outcome: Progressing   Problem: Coping: Goal: Level of anxiety will decrease Outcome: Progressing   Problem: Elimination: Goal: Will not experience complications related to bowel motility Outcome: Progressing

## 2023-06-14 NOTE — Consult Note (Cosign Needed)
Pt is a 57 year old male admitted for evaluation of being unable to get up and ambulate, and not eating for 2 days. 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   Psych consult placed for capacity to make medical decisions. Due to language barrier, an interpreter was present during the history-taking and subsequent discussion (and for part of the physical exam) with this patient. [name] with [facility].   Decision being assessed:ability to discharge In an evaluation of capacity, each of the following criteria must be met based for a patient to have capacity to make the decision in question.   Criterion 1: The patient demonstrates a clear and consistent voluntary choice with regard to treatment options. No Criterion 2: The patient adequately understands the disease they have, the treatment proposed, the risks of treatment, and the risks of other treatment (including no treatment). Yes Criterion 3: The patient acknowledges that the details of Criterion 2 apply to them specifically and the likely consequences of treatment options proposed. No Criterion 4: The patient demonstrates adequate reasoning/rationality within the context of their decision and can provide justification for their choice. No  In this case, the patient [name]  DOES NOT have capacity to decide to discharge.   See patient interview below for details. Of note, this capacity evaluation assesses only for the specified decision documented above at the time of the assessment and is not a substitute for determination of the patient's overall competency, which can only be adjudicated.    Mental Capacity Assessment: I have evaluated the following areas to assess the [name]'s mental capacity regarding medical decision-making ability which pertains to  competency to discharge.   The specific treatment or service in question is: History of TBI, DSS is requesting capacity evaluation prior to pursuing guardianship.  Communication: The patient was able to clearly state preferred treatment options for his foot. The patient initially deflected multiple questions throughout the interview, after rephrasing the questions patient was able to answer them more clearly.  He does verbalize that he was able to take care of himself prior to getting sick. While he doesn't think he needs to be in the hospital he will stay for therapy. Factors that could compromise this communication process include: language barrier, traumatic brain injury, suspected history of cognitive delay.  Regarding his treatment as proposed he states "they are doing therapy for me."   Understanding: The patient was unable to recall information, link causal relationships, and process general probabilities regarding life situations and medical treatment scenarios "If I fell at home. I would stretch my hands out to try to prevent hurting myself. Although I would do everything possible to not hurt myself. "  He was unable to paraphrase his view of the current situation and his thoughts about it.  He continues to ruminate about his foot and therapy. He is able to brief discuss his fear of hospitals surrounding child hood trauma when being treated for TB.  He is future oriented yet unable to provide any related scenarios regarding understanding of current proposed treatment, consequences related to refusal of treatment, and unsafe disposition. The patient did present with impairments in language and limited intelligence.   Appreciation: The patient was able to identify and describe his various illnesses and treatment options with potential outcomes. The patient did not present with concerns such as denial or delusional thought-process.  Patient did  deny previous psychiatric history. The patient endorsed  depression today "Im depressed because I am here only."   Rationalization: The patient was unable to weigh risks and benefits and come to a conclusion congruent with patient's perceived goals. Concerns regarding this category are: depression, cognitive delay, and traumatic brain injury,  In conclusion, the patient is-not experiencing an acute medical scenario.    Conclusion: At this time, there is sufficient evidence to warrant removal of the patient's rights for medical decision-making as it pertains to discharging home.  He can NOT clearly determine mental capacity for decision-making unsure if this is secondary to TBI and or pre-existing cognitive delay.  At this time, we can determine that the patient does NOT have functional mental capacity for medical decision-making including the right to discharge home with no safe disposition.    A substitute decision maker must be sought to authorize medical intervention or to refuse treatment on behalf of the patient; for patients with advance directives, either the treatment choice that the patient made in advance or the choice of a surrogate decision maker may be indicated. In the absence of an advance directive and when time is available, the recommendation is usually to contact family members (the priority order to be approached is the spouse, adult children, parents, siblings, and other relatives).  Surrogate decision making for consent to make medical decisions for patient: The medical team has well-founded concerns regarding [name] capacity to provide consent for treatment.   The medical team, together with the social work team, should by good faith attempt to find and contact any family or friends who may be able to make decisions on this patient's behalf.  While legal guardianship is ideal for the long-term benefit of a patient in this situation, there is no appointed guardian for [name] yet.    Psychiatry consult service to sign off at  this time.  Thank you for this capacity consult.

## 2023-06-14 NOTE — Plan of Care (Signed)
  Problem: Education: Goal: Knowledge of General Education information will improve Description: Including pain rating scale, medication(s)/side effects and non-pharmacologic comfort measures Outcome: Progressing   Problem: Clinical Measurements: Goal: Ability to maintain clinical measurements within normal limits will improve Outcome: Progressing   Problem: Activity: Goal: Risk for activity intolerance will decrease Outcome: Progressing   Problem: Nutrition: Goal: Adequate nutrition will be maintained Outcome: Progressing   Problem: Pain Managment: Goal: General experience of comfort will improve Outcome: Progressing   

## 2023-06-14 NOTE — Progress Notes (Signed)
Ordered pt meals by [name] Spanish Medical Interpreter. 

## 2023-06-14 NOTE — TOC Progression Note (Addendum)
Transition of Care [facility]) - Progression Note    Patient Details  Name: [name] MRN: [id number] Date of Birth: [birth year]  Transition of Care [facility]) CM/SW Contact  Lorri Frederick, LCSW Phone Number: 06/14/2023, 9:56 AM  Clinical Narrative:   CSW able to contact [facility] APS worker [name] by phone.  She reports that she made face to face contact with the pt last week.  Cell # [phone]. Fax: [phone].  She has to request medical records on pt, which she will do today.    1100: CSW received call from Angeal Roberson/APS.  They are requesting decision making capacity eval be completed on pt.  MD notified.     Expected Discharge Plan: Home w Home Health Services Barriers to Discharge: No Barriers Identified  Expected Discharge Plan and Services       Living arrangements for the past 2 months: Single Family Home                                       Social Determinants of Health (SDOH) Interventions SDOH Screenings   Depression (PHQ2-9): Low Risk  (03/11/2023)  Tobacco Use: Low Risk  (06/02/2023)    Readmission Risk Interventions     No data to display

## 2023-06-14 NOTE — Plan of Care (Signed)
  Problem: Education: Goal: Ability to describe self-care measures that may prevent or decrease complications (Diabetes Survival Skills Education) will improve Outcome: Progressing Goal: Individualized Educational Video(s) Outcome: Progressing   Problem: Coping: Goal: Ability to adjust to condition or change in health will improve Outcome: Progressing   Problem: Fluid Volume: Goal: Ability to maintain a balanced intake and output will improve Outcome: Progressing   Problem: Health Behavior/Discharge Planning: Goal: Ability to identify and utilize available resources and services will improve Outcome: Progressing Goal: Ability to manage health-related needs will improve Outcome: Progressing   Problem: Metabolic: Goal: Ability to maintain appropriate glucose levels will improve Outcome: Progressing   Problem: Nutritional: Goal: Progress toward achieving an optimal weight will improve Outcome: Progressing   Problem: Skin Integrity: Goal: Risk for impaired skin integrity will decrease Outcome: Progressing   Problem: Tissue Perfusion: Goal: Adequacy of tissue perfusion will improve Outcome: Progressing   Problem: Education: Goal: Knowledge of General Education information will improve Description: Including pain rating scale, medication(s)/side effects and non-pharmacologic comfort measures Outcome: Progressing   Problem: Health Behavior/Discharge Planning: Goal: Ability to manage health-related needs will improve Outcome: Progressing   Problem: Clinical Measurements: Goal: Ability to maintain clinical measurements within normal limits will improve Outcome: Progressing Goal: Will remain free from infection Outcome: Progressing Goal: Diagnostic test results will improve Outcome: Progressing Goal: Respiratory complications will improve Outcome: Progressing Goal: Cardiovascular complication will be avoided Outcome: Progressing   Problem: Activity: Goal: Risk for  activity intolerance will decrease Outcome: Progressing   Problem: Nutrition: Goal: Adequate nutrition will be maintained Outcome: Progressing   Problem: Coping: Goal: Level of anxiety will decrease Outcome: Progressing   Problem: Elimination: Goal: Will not experience complications related to bowel motility Outcome: Progressing Goal: Will not experience complications related to urinary retention Outcome: Progressing   Problem: Pain Managment: Goal: General experience of comfort will improve Outcome: Progressing   Problem: Safety: Goal: Ability to remain free from injury will improve Outcome: Progressing   Problem: Skin Integrity: Goal: Risk for impaired skin integrity will decrease Outcome: Progressing   Problem: Education: Goal: Knowledge of General Education information will improve Description: Including pain rating scale, medication(s)/side effects and non-pharmacologic comfort measures Outcome: Progressing   Problem: Health Behavior/Discharge Planning: Goal: Ability to manage health-related needs will improve Outcome: Progressing   Problem: Clinical Measurements: Goal: Ability to maintain clinical measurements within normal limits will improve Outcome: Progressing Goal: Will remain free from infection Outcome: Progressing Goal: Diagnostic test results will improve Outcome: Progressing Goal: Respiratory complications will improve Outcome: Progressing Goal: Cardiovascular complication will be avoided Outcome: Progressing   Problem: Activity: Goal: Risk for activity intolerance will decrease Outcome: Progressing   Problem: Nutrition: Goal: Adequate nutrition will be maintained Outcome: Progressing   Problem: Coping: Goal: Level of anxiety will decrease Outcome: Progressing   Problem: Elimination: Goal: Will not experience complications related to bowel motility Outcome: Progressing Goal: Will not experience complications related to urinary  retention Outcome: Progressing   Problem: Pain Managment: Goal: General experience of comfort will improve Outcome: Progressing   Problem: Safety: Goal: Ability to remain free from injury will improve Outcome: Progressing   Problem: Skin Integrity: Goal: Risk for impaired skin integrity will decrease Outcome: Progressing   Problem: Education: Goal: Knowledge of the prescribed therapeutic regimen will improve Outcome: Progressing Goal: Ability to verbalize activity precautions or restrictions will improve Outcome: Progressing Goal: Understanding of discharge needs will improve Outcome: Progressing

## 2023-06-15 LAB — GLUCOSE, CAPILLARY
Glucose-Capillary: 145 mg/dL — ABNORMAL HIGH (ref 70–99)
Glucose-Capillary: 137 mg/dL — ABNORMAL HIGH (ref 70–99)
Glucose-Capillary: 139 mg/dL — ABNORMAL HIGH (ref 70–99)
Glucose-Capillary: 114 mg/dL — ABNORMAL HIGH (ref 70–99)

## 2023-06-15 NOTE — Plan of Care (Signed)
  Problem: Education: Goal: Ability to describe self-care measures that may prevent or decrease complications (Diabetes Survival Skills Education) will improve Outcome: Progressing   Problem: Skin Integrity: Goal: Risk for impaired skin integrity will decrease Outcome: Progressing   Problem: Activity: Goal: Risk for activity intolerance will decrease Outcome: Progressing   Problem: Pain Managment: Goal: General experience of comfort will improve Outcome: Progressing   Problem: Safety: Goal: Ability to remain free from injury will improve Outcome: Progressing   Problem: Skin Integrity: Goal: Risk for impaired skin integrity will decrease Outcome: Progressing

## 2023-06-15 NOTE — Evaluation (Signed)
Occupational Therapy Evaluation Patient Details Name: [name] MRN: [id number] DOB: 1966-06-02 Today's Date: 06/15/2023   History of Present Illness Pt is a 57 year old male admitted for evaluation of being unable to get up and ambulate, and not eating for 2 days, admitted for left foot gangrene with osteomyelitis . 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   Clinical Impression   Pt s/p above diagnosis. Pt states no pain today, awake/alert. Pt at baseline is independent, currently does not have a place to go to after DC, not able to return to sister or friends due to violent behaviors. Pt has LLE TTWB precautions. Pt displays poor hygiene habits with toileting, not able to transfer to recliner due to decreased safety awareness/sequencing, Pt not able to turn or take steps using RW today, just lifts it up and immediately sits back down. Pt requires consistent cueing for participation, not able to tolerate full time today, repeatedly lies back down in bed. Pt would benefit from post acute skilled OT <3hrs/day to improve back to functional, independent level. Pt would benefit from continued acute OT to maximize participation and progress as able.       If plan is discharge home, recommend the following: A lot of help with walking and/or transfers;A lot of help with bathing/dressing/bathroom;Assistance with cooking/housework;Direct supervision/assist for medications management;Assist for transportation;Help with stairs or ramp for entrance    Functional Status Assessment  Patient has had a recent decline in their functional status and demonstrates the ability to make significant improvements in function in a reasonable and predictable amount of time.  Equipment Recommendations  Other (comment) (defer)    Recommendations for Other  Services       Precautions / Restrictions Precautions Precautions: Fall Restrictions Weight Bearing Restrictions: Yes LLE Weight Bearing: Touchdown weight bearing      Mobility Bed Mobility Overal bed mobility: Modified Independent             General bed mobility comments: cueing for intiation/safety    Transfers Overall transfer level: Needs assistance Equipment used: 1 person hand held assist Transfers: Sit to/from Stand Sit to Stand: Min assist           General transfer comment: Pt able to stand at EOB, difficulty maintaining TDWB precautions due to decreased cognition, Pt does not know how to use RW, lifts it up off ground frequently holding it in air despite cueing. Pt not able to take steps during session to transfer to chair or pivot, immediately sits back down and lies in bed.      Balance Overall balance assessment: Needs assistance Sitting-balance support: No upper extremity supported, Feet supported Sitting balance-Leahy Scale: Normal Sitting balance - Comments: EOB ADLs   Standing balance support: Bilateral upper extremity supported, During functional activity Standing balance-Leahy Scale: Poor Standing balance comment: reliant on RW, poor safety awareness, difficulty maintaining TDWB precautions                           ADL either performed or assessed with clinical judgement   ADL Overall ADL's : Needs assistance/impaired Eating/Feeding: Independent;Bed level   Grooming: Supervision/safety;Set up;Bed level   Upper Body Bathing: Minimal assistance;Bed level   Lower Body Bathing: Moderate assistance;Sitting/lateral leans   Upper Body Dressing : Supervision/safety;Set up   Lower Body Dressing: Moderate assistance;Sitting/lateral leans   Toilet Transfer: Moderate  assistance;Cueing for safety;Cueing for sequencing;BSC/3in1;Rolling walker (2 wheels)   Toileting- Clothing Manipulation and Hygiene: Moderate assistance;Cueing for  sequencing         General ADL Comments: Pt requires assistance for most ADLs due to cognitive impairment, difficulty sequencing, poor hygiene, uses hands to place feces into urinal. Pt requires tactile/verbal cues to stand and maintain TDWB precautions using RW     Vision         Perception         Praxis         Pertinent Vitals/Pain Pain Assessment Pain Assessment: No/denies pain     Extremity/Trunk Assessment Upper Extremity Assessment Upper Extremity Assessment: Overall WFL for tasks assessed   Lower Extremity Assessment Lower Extremity Assessment: Defer to PT evaluation       Communication Communication Communication: Difficulty following commands/understanding Following commands: Follows one step commands inconsistently   Cognition Arousal: Alert Behavior During Therapy: Flat affect, Impulsive Overall Cognitive Status: History of cognitive impairments - at baseline                                 General Comments: Per chart pt has history of TBI with residual deficits. Difficulty following commands at times. Difficulty sequencing steps, not able to stand or correctly use DME     General Comments       Exercises     Shoulder Instructions      Home Living Family/patient expects to be discharged to:: Private residence Living Arrangements: Non-relatives/Friends Available Help at Discharge: Friend(s);Available PRN/intermittently Type of Home: House Home Access: Level entry     Home Layout: One level     Bathroom Shower/Tub: Chief Strategy Officer: Standard     Home Equipment: Agricultural consultant (2 wheels);Cane - single point          Prior Functioning/Environment Prior Level of Function : Independent/Modified Independent             Mobility Comments: Ind no AD ADLs Comments: Ind        OT Problem List: Decreased strength;Decreased activity tolerance;Impaired balance (sitting and/or standing);Decreased  cognition;Decreased safety awareness;Decreased knowledge of use of DME or AE;Pain      OT Treatment/Interventions: Self-care/ADL training;Energy conservation;DME and/or AE instruction;Therapeutic activities;Cognitive remediation/compensation    OT Goals(Current goals can be found in the care plan section) Acute Rehab OT Goals Patient Stated Goal: not able to particiapte in goal setting OT Goal Formulation: With patient Time For Goal Achievement: 06/29/23 Potential to Achieve Goals: Good  OT Frequency: Min 1X/week    Co-evaluation              AM-PAC OT "6 Clicks" Daily Activity     Outcome Measure Help from another person eating meals?: None Help from another person taking care of personal grooming?: A Little Help from another person toileting, which includes using toliet, bedpan, or urinal?: A Lot Help from another person bathing (including washing, rinsing, drying)?: A Lot Help from another person to put on and taking off regular upper body clothing?: A Little Help from another person to put on and taking off regular lower body clothing?: A Lot 6 Click Score: 16   End of Session Equipment Utilized During Treatment: Gait belt;Rolling walker (2 wheels) Nurse Communication: Mobility status  Activity Tolerance: Other (comment) (limited due to decreased cognition/attention to task, motivation to participate) Patient left: in bed;with call bell/phone within reach;with bed alarm set  OT Visit Diagnosis: Unsteadiness on feet (R26.81);Other abnormalities of gait and mobility (R26.89);Muscle weakness (generalized) (M62.81);Other symptoms and signs involving cognitive function;Pain Pain - Right/Left: Left Pain - part of body: Ankle and joints of foot                Time: 1242-1301 OT Time Calculation (min): 19 min Charges:  OT General Charges $OT Visit: 1 Visit OT Evaluation $OT Eval Low Complexity: 1 Low  [address], OTR/L   [name] 06/15/2023, 1:27 PM

## 2023-06-15 NOTE — Progress Notes (Signed)
Nutrition Follow-up  DOCUMENTATION CODES:   Not applicable  INTERVENTION:  Continue carb modified diet Ensure Enlive po BID, each supplement provides 350 kcal and 20 grams of protein. Juven BID to support wound healing MVI with minerals daily  NUTRITION DIAGNOSIS:   Increased nutrient needs related to acute illness (L foot gangrene with osteomyelitis; wound vac) as evidenced by estimated needs. 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 He was found to have L foot gangrene with osteomyelitis.  7/31: s/p L TMA with wound VAC 8/8: wound VAC discontinued  Medically stable. Pending safe dispo.  Attempted to speak with pt at bedside however NT providing patient care.   RN reports pt eating really well and consuming all nutrition supplements.   Meal completions: 100% x 8 recorded meals  No updated weights on file to review since admission.   Medications: Vitamin C, colace, SSI 0-5 units at bedtime, SSI 0-9 units TID, semglee 10 units daily, MVI with iron, protonix   Labs: CBG's 145-173 x24 hours  Diet Order:   Diet Order             Diet Carb Modified Fluid consistency: Thin; Room service appropriate? No  Diet effective now                   EDUCATION NEEDS:   Not appropriate for education at this time  Skin:  Skin Assessment: Skin Integrity Issues: Skin Integrity Issues:: Incisions Incisions: Left foot  Last BM:  8/13 (type 4x1, type 5x1)  Height:   Ht Readings from Last 1  Encounters:  03/11/23 5\' 2"  (1.575 m)    Weight:   Wt Readings from Last 1 Encounters:  05/26/23 54.6 kg   BMI:  Body mass index is 22.02 kg/m.  Estimated Nutritional Needs:   Kcal:  1750-1950 kcals  Protein:  80-100 grams  Fluid:  >/= 1.7L  Drusilla Kanner, RDN, LDN Clinical Nutrition

## 2023-06-15 NOTE — Progress Notes (Signed)
Ordered pt meals by Orlan Leavens Spanish Medical interpreter.

## 2023-06-15 NOTE — Progress Notes (Signed)
PROGRESS NOTE    [name]  ZOX:[id number] DOB: February 21, 1966 DOA: 05/26/2023 PCP: Marcine Matar, MD   Brief Narrative:   57 y.o. 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  He underwent left TMA with wound VAC by Dr. Lajoyce Corners on 06/02/2023.  Therapy recommended home health but patient has no safe disposition. Per sister's report to [facility], "he was staying with some people because he couldn't stay with sister anymore due to violent behaviors. Sister was bringing food and they were letting him stay for free. And now those people are saying he can't come back and sister can't take him either". TOC filed APS report.    Assessment & Plan:   Left foot cellulitis with gangrene/osteomyelitis -MRI with osteo of the 2nd toe and plantar muscles suggesting myositis He was seen by the vascular surgeon and there was no indication for vascular intervention. -S/p left TMA and wound VAC placement by Dr. Lajoyce Corners on 7/31. -Per Ortho, surgical source control achieved.  Antibiotic discontinued. -Analgesics as needed for pain. -PT/OT initially recommended home health and DME but no safe disposition.  PT now recommending SNF.  TOC following.     H/o TBI/cognitive impairment and intermittent aggressive behaviors Depression -Continue Risperdal, Prozac and hydroxyzine -Psychiatry evaluation appreciated: Patient does not have capacity   Unsafe living situation: Sister says living in a filthy, unsafe living situation.  Patient with TBI/cognitive impairment.  Has no capacity to make medical decision. -TOC filed APS report.   Controlled DM-2 with hyperglycemia:  -Continue insulin glargine and NovoLog as needed.  Hemoglobin A1c was 5.8%.   Normocytic anemia: H&H stable.  Anemia panel suggests anemia of chronic disease.  Monitor  intermittently.   Fall in the hospital: No apparent injury or acute focal neurodeficit. -Fall precaution   Seizure disorder  Continue Keppra 500 mg twice daily    Recurrent hyponatremia: Asymptomatic.  Monitor intermittently  Hypertension -Blood pressure stable.  Continue lisinopril.  Hyperlipidemia -Continue statin   DVT prophylaxis: Lovenox Code Status: Full Family Communication: None at bedside Disposition Plan: Status is: Inpatient Remains inpatient appropriate because: Of severity of illness.  Unsafe disposition plan    Consultants: Orthopedic/vascular surgery/psychiatry  Procedures: As above  Antimicrobials:  Anti-infectives (From admission, onward)    Start     Dose/Rate Route Frequency Ordered Stop   06/03/23 0600  ceFAZolin (ANCEF) IVPB 2g/100 mL premix        2 g 200 mL/hr over 30 Minutes Intravenous On call to O.R. 06/02/23 1149 06/02/23 1330   06/02/23 2200  ceFAZolin (ANCEF) IVPB 2g/100 mL premix        2 g 200 mL/hr over 30 Minutes Intravenous Every 8 hours 06/02/23 1514 06/03/23 0639   06/02/23 1142  ceFAZolin (ANCEF) 2-4 GM/100ML-% IVPB       Note to Pharmacy: [facility], [name]: cabinet override      06/02/23 1142 06/02/23 1330   06/01/23 1715  metroNIDAZOLE (FLAGYL) IVPB 500 mg  Status:  Discontinued        500 mg 100 mL/hr over 60 Minutes Intravenous Every 12 hours 06/01/23 1615 06/02/23 1515   05/30/23 0800  ceFAZolin (ANCEF) IVPB 2g/100 mL premix        2 g 200 mL/hr over 30 Minutes Intravenous On call to O.R. 05/30/23 0412 05/30/23 1209   05/28/23 1500  vancomycin (VANCOREADY)  IVPB 1250 mg/250 mL  Status:  Discontinued        1,250 mg 166.7 mL/hr over 90 Minutes Intravenous Every 24 hours 05/28/23 1211 06/02/23 1515   05/28/23 1145  vancomycin (VANCOREADY) IVPB 1250 mg/250 mL  Status:  Discontinued        1,250 mg 166.7 mL/hr over 90 Minutes Intravenous Every 24 hours 05/28/23 1051 05/28/23 1211   05/26/23 2030  cefTRIAXone (ROCEPHIN) 2 g in  sodium chloride 0.9 % 100 mL IVPB        2 g 200 mL/hr over 30 Minutes Intravenous Every 24 hours 05/26/23 2026 06/01/23 1845        Subjective: Patient seen and examined at bedside.  Poor historian.  No vomiting or seizures or agitation reported. Objective: Vitals:   06/14/23 1421 06/14/23 2009 06/15/23 0521 06/15/23 0710  BP: 120/65 101/65 116/74 130/76  Pulse: 78 75 74 76  Resp: 17 18 18 16   Temp: 98.6 F (37 C) 97.8 F (36.6 C) 98.5 F (36.9 C) 97.9 F (36.6 C)  TempSrc: Oral Oral  Oral  SpO2: 98% 98% 100% 100%  Weight:        Intake/Output Summary (Last 24 hours) at 06/15/2023 0826 Last data filed at 06/14/2023 2050 Gross per 24 hour  Intake --  Output 2100 ml  Net -2100 ml   Filed Weights   05/26/23 2321  Weight: 54.6 kg    Examination:  General exam: No distress.  Currently on room air.  Looks chronically ill and deconditioned.  Awake, still slow to respond, poor historian.  Very flat affect. Respiratory system: Decreased breath sounds at bases bilaterally with some crackles  cardiovascular system: Rate mostly controlled; S1 and S2 are heard gastrointestinal system: Abdomen is slightly distended; soft and nontender.  Bowel sounds normally heard  extremities: Mild lower extremity edema; no cyanosis left foot dressing present   Data Reviewed: I have personally reviewed following labs and imaging studies  CBC: Recent Labs  Lab 06/12/23 0320  WBC 6.1  HGB 8.3*  HCT 24.9*  MCV 89.6  PLT 280   Basic Metabolic Panel: Recent Labs  Lab 06/12/23 0320  NA 131*  K 4.5  CL 98  CO2 26  GLUCOSE 178*  BUN 35*  CREATININE 0.83  CALCIUM 8.3*  MG 1.9  PHOS 3.7   GFR: Estimated Creatinine Clearance: 75.8 mL/min (by C-G formula based on SCr of 0.83 mg/dL). Liver Function Tests: No results for input(s): "AST", "ALT", "ALKPHOS", "BILITOT", "PROT", "ALBUMIN" in the last 168 hours. No results for input(s): "LIPASE", "AMYLASE" in the last 168 hours. No results  for input(s): "AMMONIA" in the last 168 hours. Coagulation Profile: No results for input(s): "INR", "PROTIME" in the last 168 hours. Cardiac Enzymes: No results for input(s): "CKTOTAL", "CKMB", "CKMBINDEX", "TROPONINI" in the last 168 hours. BNP (last 3 results) No results for input(s): "PROBNP" in the last 8760 hours. HbA1C: No results for input(s): "HGBA1C" in the last 72 hours. CBG: Recent Labs  Lab 06/14/23 0746 06/14/23 1119 06/14/23 1629 06/14/23 1940 06/15/23 0709  GLUCAP 110* 173* 158* 152* 145*   Lipid Profile: No results for input(s): "CHOL", "HDL", "LDLCALC", "TRIG", "CHOLHDL", "LDLDIRECT" in the last 72 hours. Thyroid Function Tests: No results for input(s): "TSH", "T4TOTAL", "FREET4", "T3FREE", "THYROIDAB" in the last 72 hours. Anemia Panel: No results for input(s): "VITAMINB12", "FOLATE", "FERRITIN", "TIBC", "IRON", "RETICCTPCT" in the last 72 hours. Sepsis Labs: No results for input(s): "PROCALCITON", "LATICACIDVEN" in the last 168 hours.  No results  found for this or any previous visit (from the past 240 hour(s)).       Radiology Studies: No results found.      Scheduled Meds:  vitamin C  1,000 mg Oral Daily   atorvastatin  10 mg Oral Daily   docusate sodium  100 mg Oral Daily   enoxaparin (LOVENOX) injection  40 mg Subcutaneous Daily   feeding supplement  237 mL Oral BID BM   FLUoxetine  30 mg Oral Daily   insulin aspart  0-5 Units Subcutaneous QHS   insulin aspart  0-9 Units Subcutaneous TID WC   insulin glargine-yfgn  10 Units Subcutaneous QHS   levETIRAcetam  500 mg Oral BID   lisinopril  2.5 mg Oral Daily   multivitamins with iron  1 tablet Oral Daily   nutrition supplement (JUVEN)  1 packet Oral BID BM   pantoprazole  40 mg Oral Daily   risperiDONE  0.25 mg Oral QHS   Continuous Infusions:        Glade Lloyd, MD [facility] 06/15/2023, 8:26 AM

## 2023-06-15 NOTE — TOC Progression Note (Addendum)
Transition of Care [facility]) - Progression Note    Patient Details  Name: [name] MRN: [id number] Date of Birth: [birth year]  Transition of Care [facility]) CM/SW Contact  Lorri Frederick, LCSW Phone Number: 06/15/2023, 12:06 PM  Clinical Narrative:   Decision making eval faxed to Marylene Land at [facility].  1400: Email from Beverly/Financial Counseling.  Referral was made to [place] at [employer] for [facility] screening and application.    Referral sent out in hub for SNF. PASSR additional info uploaded to Grover Must.  PASSR pending.     1445: Message from Joaine/[employer] regarding medicaid screening:   Unfortunately this patient will not qualify for ongoing Medicaid due to his citizenship status.  We are reviewing his dates for this inpatient stay for Emergency Medicaid (critical care where medical intervention was immediately necessary to prevent loss of life) only at this time.  He will not qualify for Medicaid to cover any type of placement.  He will not qualify for Social Security Disability either due to his citizenship status.    ? Joaine McKeel  Sr. Team Lead - Eligibility    1540: psych capacity eval fax has failed twice.  Trying to get another number.     Expected Discharge Plan: Home w Home Health Services Barriers to Discharge: No Barriers Identified  Expected Discharge Plan and Services       Living arrangements for the past 2 months: Single Family Home                                       Social Determinants of Health (SDOH) Interventions SDOH Screenings   Depression (PHQ2-9): Low Risk  (03/11/2023)  Tobacco Use: Low Risk  (06/02/2023)    Readmission Risk Interventions     No data to display

## 2023-06-15 NOTE — Progress Notes (Signed)
RE:  [name]       Date of Birth:  November 22, 2065     Date:  06/15/23        To Whom It May Concern:  Please be advised that the above-named patient will require a short-term nursing home stay - anticipated 30 days or less for rehabilitation and strengthening.  The plan is for return home.                MD signature               Date

## 2023-06-15 NOTE — NC FL2 (Signed)
[place] MEDICAID FL2 LEVEL OF CARE FORM     IDENTIFICATION  Patient Name: [name] Birthdate: [birth year] Sex: male Admission Date (Current Location): 05/26/2023  [facility] [place] and [place] Number:  Producer, television/film/video and Address:  [facility] [place]. [facility], [address] [place], [place] [address]      Provider Number: [id number]  Attending Physician Name and Address:  Glade Lloyd, MD  Relative Name and Phone Number:  Elby Showers Niece   [phone]    Current Level of Care: Hospital Recommended Level of Care: Skilled Nursing Facility Prior Approval Number:    Date Approved/Denied:   PASRR Number:    Discharge Plan: SNF    Current Diagnoses: Patient Active Problem List   Diagnosis Date Noted   Cutaneous abscess of left foot 05/27/2023   PAD (peripheral artery disease) (HCC) 05/27/2023   Severe protein-calorie malnutrition (HCC) 05/27/2023   Cellulitis of left foot 05/26/2023   Burnout of caregiver 07/09/2022   Aggressive behavior of adult 07/09/2022   Gastric polyp    Gastritis and gastroduodenitis    History of traumatic brain injury 05/29/2021   MDD (major depressive disorder), recurrent, in partial remission (HCC) 11/12/2020   MDD (major depressive disorder), recurrent episode, moderate (HCC) 07/16/2020   Influenza vaccine needed 07/14/2019   Microalbuminuria due to type 2 diabetes mellitus (HCC) 04/11/2019   Hypothyroidism 09/21/2018   Seizure disorder (HCC) 06/16/2018   Low testosterone 06/16/2018   Anemia, chronic disease 10/29/2017   Positive depression screening 10/29/2017   Attention and concentration deficit    Type 2 diabetes mellitus with peripheral neuropathy (HCC)    S/P craniotomy 03/19/2017   Essential hypertension    Dysphagia, oropharyngeal 02/01/2017   Diabetes type 2, uncontrolled 06/13/2015    Orientation RESPIRATION BLADDER Height & Weight     Self, Time, Place  Normal Continent Weight: 120 lb 5.9 oz  (54.6 kg) Height:     BEHAVIORAL SYMPTOMS/MOOD NEUROLOGICAL BOWEL NUTRITION STATUS      Incontinent Diet (see discharge summary)  AMBULATORY STATUS COMMUNICATION OF NEEDS Skin   Limited Assist Verbally Surgical wounds                       Personal Care Assistance Level of Assistance  Bathing, Feeding, Dressing Bathing Assistance: Limited assistance Feeding assistance: Independent Dressing Assistance: Limited assistance     Functional Limitations Info  Sight, Hearing, Speech Sight Info: Adequate Hearing Info: Adequate Speech Info: Adequate    SPECIAL CARE FACTORS FREQUENCY  PT (By licensed PT), OT (By licensed OT)     PT Frequency: 5x week OT Frequency: 5x week            Contractures Contractures Info: Not present    Additional Factors Info  Code Status, Allergies, Insulin Sliding Scale Code Status Info: full Allergies Info: latex   Insulin Sliding Scale Info: novolog       Current Medications (06/15/2023):  This is the current hospital active medication list Current Facility-Administered Medications  Medication Dose Route Frequency Provider Last Rate Last Admin   acetaminophen (TYLENOL) tablet 650 mg  650 mg Oral Q6H PRN [name], MD   650 mg at 06/13/23 2156   Or   acetaminophen (TYLENOL) suppository 650 mg  650 mg Rectal Q6H PRN [name], MD       alum & mag hydroxide-simeth (MAALOX/MYLANTA) 200-200-20 MG/5ML suspension 15-30 mL  15-30 mL Oral Q2H PRN [name], MD  ascorbic acid (VITAMIN C) tablet 1,000 mg  1,000 mg Oral Daily [name], MD   1,000 mg at 06/15/23 0809   atorvastatin (LIPITOR) tablet 10 mg  10 mg Oral Daily [name], MD   10 mg at 06/15/23 0809   bisacodyl (DULCOLAX) EC tablet 5 mg  5 mg Oral Daily PRN [name], MD       docusate sodium (COLACE) capsule 100 mg  100 mg Oral Daily [name], MD   100 mg at 06/15/23 0810   enoxaparin (LOVENOX) injection 40 mg  40 mg Subcutaneous Daily [name], MD   40 mg at 06/15/23 0810   feeding supplement (ENSURE ENLIVE / ENSURE PLUS) liquid 237 mL  237 mL Oral BID BM Gonfa, Taye T, MD   237 mL at 06/15/23 1227   FLUoxetine (PROZAC) capsule 30 mg  30 mg Oral Daily [name], MD   30 mg at 06/15/23 0809   guaiFENesin-dextromethorphan (ROBITUSSIN DM) 100-10 MG/5ML syrup 15 mL  15 mL Oral Q4H PRN [name], MD       hydrALAZINE (APRESOLINE) injection 5 mg  5 mg Intravenous Q20 Min PRN [name], MD       HYDROmorphone (DILAUDID) injection 0.5-1 mg  0.5-1 mg Intravenous Q4H PRN [name], MD       insulin aspart (novoLOG) injection 0-5 Units  0-5 Units Subcutaneous QHS [name], MD   2 Units at 06/09/23 2216   insulin aspart (novoLOG) injection 0-9 Units  0-9 Units Subcutaneous TID WC Candelaria Stagers T, MD   1 Units at 06/15/23 1228   insulin glargine-yfgn (SEMGLEE) injection 10 Units  10 Units Subcutaneous QHS [name], MD   10 Units at 06/14/23 2148   labetalol (NORMODYNE) injection 10 mg  10 mg Intravenous Q10 min PRN [name], MD       levETIRAcetam (KEPPRA) tablet 500 mg  500 mg Oral BID [name], MD   500 mg at 06/15/23 0809   lisinopril (ZESTRIL) tablet 2.5 mg  2.5 mg Oral Daily [name], MD   2.5 mg at 06/15/23 0809   loperamide (IMODIUM) capsule 2 mg  2 mg Oral PRN Candelaria Stagers T, MD   2 mg at 06/10/23 1549   magnesium citrate solution 1 Bottle  1 Bottle Oral Once PRN [name], MD       metoprolol tartrate (LOPRESSOR) injection 2-5 mg  2-5 mg Intravenous Q2H PRN [name], MD       multivitamins with iron tablet 1 tablet  1 tablet Oral Daily Almon Hercules, MD   1 tablet at 06/15/23 1610   nutrition supplement (JUVEN) (JUVEN) powder packet 1 packet  1 packet Oral BID BM [name], MD   1 packet at 06/15/23 1228   ondansetron (ZOFRAN) tablet 4 mg  4 mg Oral Q6H PRN [name], MD       Or   ondansetron [facility]) injection 4 mg  4 mg Intravenous Q6H PRN [name], MD        oxyCODONE (Oxy IR/ROXICODONE) immediate release tablet 10-15 mg  10-15 mg Oral Q4H PRN [name], MD   10 mg at 06/08/23 1825   oxyCODONE (Oxy IR/ROXICODONE) immediate release tablet 5-10 mg  5-10 mg Oral Q4H PRN [name], MD   10 mg at 06/08/23 2044   pantoprazole (PROTONIX) EC tablet 40  mg  40 mg Oral Daily [name], MD   40 mg at 06/15/23 0809   phenol (CHLORASEPTIC) mouth spray 1 spray  1 spray Mouth/Throat PRN [name], MD       polyethylene glycol (MIRALAX / GLYCOLAX) packet 17 g  17 g Oral Daily PRN [name], MD       risperiDONE (RISPERDAL) tablet 0.25 mg  0.25 mg Oral QHS [name], MD   0.25 mg at 06/14/23 2148   senna-docusate (Senokot-S) tablet 1 tablet  1 tablet Oral QHS PRN [name], MD         Discharge Medications: Please see discharge summary for a list of discharge medications.  Relevant Imaging Results:  Relevant Lab Results:   Additional Information SSN: [id number]  Lorri Frederick, LCSW

## 2023-06-16 LAB — GLUCOSE, CAPILLARY
Glucose-Capillary: 99 mg/dL (ref 70–99)
Glucose-Capillary: 149 mg/dL — ABNORMAL HIGH (ref 70–99)
Glucose-Capillary: 133 mg/dL — ABNORMAL HIGH (ref 70–99)

## 2023-06-16 NOTE — Progress Notes (Signed)
[facility] PROGRESS NOTE  [name] (DOB: [birth year]) ZHY:[id number] PCP: Marcine Matar, MD  Brief Narrative: [name] is a 57 y.o. 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 He underwent left TMA with wound VAC by Dr. Lajoyce Corners on 06/02/2023. Therapy recommended home health but patient has no safe disposition. Per sister's report to [facility], "he was staying with some people because he couldn't stay with sister anymore due to violent behaviors. Sister was bringing food and they were letting him stay for free. And now those people are saying he can't come back and sister can't take him either". TOC filed APS report.   Subjective: Patient has no pain or other complaints this morning.   Objective: BP 115/65 (BP Location: Right Arm)   Pulse 68   Temp 98.2 F (36.8 C) (Oral)   Resp 17   Wt 54.6 kg   SpO2 100%   BMI 22.02 kg/m   Gen: No distress Pulm: Clear, nonlabored  CV: RRR, no MRG or edema GI: Soft, NT, ND, +BS  Skin: Left TMA site wrapped c/d/I with no proximally spreading erythema. No discharge.   Assessment & Plan: Left foot cellulitis with gangrene/osteomyelitis -MRI with osteo of the 2nd toe and plantar muscles suggesting myositis He was seen by the vascular surgeon and there was no indication for vascular intervention. -S/p left TMA and wound VAC placement by Dr. Lajoyce Corners on 7/31. -Per Ortho, surgical source control achieved.  Antibiotic discontinued. -Analgesics as needed for pain. -PT/OT initially recommended home health and DME but no safe disposition.  PT now recommending SNF. TOC following.   H/o TBI/cognitive impairment and intermittent aggressive behaviors Depression -Continue Risperdal, Prozac and hydroxyzine -Psychiatry evaluation appreciated: Patient does not have  capacity   Unsafe living situation: Sister says living in a filthy, unsafe living situation.  Patient with TBI/cognitive impairment.  Has no capacity to make medical decision. - TOC filed APS report.   T2DM with hyperglycemia: HbA1c 5.8%.  - Continue glargine 10u daily and SSI. At inpatient goal.     Anemia of chronic disease: Stable. - Monitor intermittently.   Fall in the hospital: No apparent injury or acute focal neurodeficit. - Fall precautions   Seizure disorder: - Continue keppra 500mg  BID    Hyponatremia: Very mild, asymptomatic.  - Monitor intermittently.   Hypertension: Normotensive - Continue lisinopril.   Hyperlipidemia - Continue statin  Tyrone Nine, MD [facility] [email or web address] 06/16/2023, 3:27 PM

## 2023-06-16 NOTE — Progress Notes (Signed)
Physical Therapy Treatment Patient Details Name: [name] MRN: [id number] DOB: 12/20/1965 Today's Date: 06/16/2023   History of Present Illness Pt is a 57 year old male admitted for evaluation of being unable to get up and ambulate, and not eating for 2 days, admitted for left foot gangrene with osteomyelitis . 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    PT Comments  Pt is still having difficulty maintaining TDWB and using cues given in Spanish to safely transfer and walk in the RW, again maintaining TDWB.  Emphasis on training for safe sit to stands and standing in and walking with the RW while sequencing at a TDWB level.     If plan is discharge home, recommend the following: A little help with walking and/or transfers;A little help with bathing/dressing/bathroom;Assistance with cooking/housework;Direct supervision/assist for medications management;Assist for transportation;Help with stairs or ramp for entrance   Can travel by private vehicle     No  Equipment Recommendations  Other (comment) (TBD)    Recommendations for Other Services       Precautions / Restrictions Precautions Precautions: Fall Restrictions Weight Bearing Restrictions: Yes LLE Weight Bearing: Touchdown weight bearing     Mobility  Bed Mobility Overal bed mobility: Modified Independent             General bed mobility comments: cueing for intiation/safety    Transfers Overall transfer level: Needs assistance Equipment used: 1 person hand held assist Transfers: Sit to/from Stand Sit to Stand: Min assist (x4)     Squat pivot transfers: Min assist     General transfer comment: Pt able to stand at EOB, but with difficulty maintaining TDWB precautions due to decreased cognition.  Therapist tried multiple different cuing methods with  sister translating instead of the ipad interpreter, which pt can't pay attention to.  Generally, this pt is unable to   attend to more than 1 simple command and 1 simple command is not sufficient to help pt understand "Swing to pattern without weight on the L LE."    Ambulation/Gait   Gait Distance (Feet): 5 Feet Assistive device: Rolling walker (2 wheels) Gait Pattern/deviations: Step-to pattern, Decreased stance time - right, Decreased stance time - left, Decreased stride length       General Gait Details: noticeable fear from the pt and inability to follow direction to keep weight off his foot after multiple cuing changes.   Stairs             Wheelchair Mobility     Tilt Bed    Modified Rankin (Stroke Patients Only)       Balance Overall balance assessment: Needs assistance Sitting-balance support: No upper extremity supported, Feet supported Sitting balance-Leahy Scale: Normal Sitting balance - Comments: EOB ADLs   Standing balance support: Bilateral upper extremity supported, During functional activity Standing balance-Leahy Scale: Poor Standing balance comment: reliant on RW, poor safety awareness, difficulty maintaining TDWB precautions                            Cognition Arousal: Alert Behavior During Therapy: Flat affect, Impulsive Overall Cognitive Status: History of cognitive impairments - at baseline                                 General Comments: Per chart  pt has history of TBI with residual deficits. Difficulty following commands at times. Difficulty sequencing steps, not able to stand  while maintaining TDWB or correctly/ intuitively use DME        Exercises Other Exercises Other Exercises: bil SLR x 10 reps  A/AA Other Exercises: bil hip/knee flex/ext with graded resistance oveall.  Resistance given through the thigh on L LE  x10 reps Other Exercises: bil  bicep/tricep presses x10 reps with graded resistance.     General Comments General comments (skin integrity, edema, etc.): vss      Pertinent Vitals/Pain Pain Assessment Pain Assessment: Faces Faces Pain Scale: Hurts a little bit Pain Location: L Foot Pain Descriptors / Indicators: Discomfort    Home Living                          Prior Function            PT Goals (current goals can now be found in the care plan section) Acute Rehab PT Goals PT Goal Formulation: With patient Time For Goal Achievement: 06/24/23 Potential to Achieve Goals: Fair Progress towards PT goals: Progressing toward goals    Frequency    Min 1X/week      PT Plan Current plan remains appropriate    Co-evaluation              AM-PAC PT "6 Clicks" Mobility   Outcome Measure  Help needed turning from your back to your side while in a flat bed without using bedrails?: None Help needed moving from lying on your back to sitting on the side of a flat bed without using bedrails?: None Help needed moving to and from a bed to a chair (including a wheelchair)?: A Little Help needed standing up from a chair using your arms (e.g., wheelchair or bedside chair)?: A Little Help needed to walk in hospital room?: A Lot Help needed climbing 3-5 steps with a railing? : Total 6 Click Score: 17    End of Session   Activity Tolerance: Patient limited by fatigue (and fears of standing/using RW with no weight L LE) Patient left: in chair;in CPM;with chair alarm set Nurse Communication: Mobility status PT Visit Diagnosis: Other abnormalities of gait and mobility (R26.89)     Time: 1610-9604 PT Time Calculation (min) (ACUTE ONLY): 43 min  Charges:    $Gait Training: 8-22 mins $Therapeutic Activity: 23-37 mins PT General Charges $$ ACUTE PT VISIT: 1 Visit                     06/16/2023  Jacinto Halim., PT Acute Rehabilitation Services [phone]  (office)   Eliseo Gum  06/16/2023, 11:58 AM

## 2023-06-16 NOTE — Plan of Care (Signed)
  Problem: Education: Goal: Ability to describe self-care measures that may prevent or decrease complications (Diabetes Survival Skills Education) will improve Outcome: Progressing Goal: Individualized Educational Video(s) Outcome: Progressing   Problem: Coping: Goal: Ability to adjust to condition or change in health will improve Outcome: Progressing   Problem: Fluid Volume: Goal: Ability to maintain a balanced intake and output will improve Outcome: Progressing   Problem: Health Behavior/Discharge Planning: Goal: Ability to identify and utilize available resources and services will improve Outcome: Progressing Goal: Ability to manage health-related needs will improve Outcome: Progressing   Problem: Metabolic: Goal: Ability to maintain appropriate glucose levels will improve Outcome: Progressing   Problem: Nutritional: Goal: Progress toward achieving an optimal weight will improve Outcome: Progressing   Problem: Skin Integrity: Goal: Risk for impaired skin integrity will decrease Outcome: Progressing   Problem: Tissue Perfusion: Goal: Adequacy of tissue perfusion will improve Outcome: Progressing   Problem: Education: Goal: Knowledge of General Education information will improve Description: Including pain rating scale, medication(s)/side effects and non-pharmacologic comfort measures Outcome: Progressing   Problem: Health Behavior/Discharge Planning: Goal: Ability to manage health-related needs will improve Outcome: Progressing   Problem: Clinical Measurements: Goal: Ability to maintain clinical measurements within normal limits will improve Outcome: Progressing Goal: Will remain free from infection Outcome: Progressing Goal: Diagnostic test results will improve Outcome: Progressing Goal: Respiratory complications will improve Outcome: Progressing Goal: Cardiovascular complication will be avoided Outcome: Progressing   Problem: Activity: Goal: Risk for  activity intolerance will decrease Outcome: Progressing   Problem: Nutrition: Goal: Adequate nutrition will be maintained Outcome: Progressing   Problem: Coping: Goal: Level of anxiety will decrease Outcome: Progressing   Problem: Elimination: Goal: Will not experience complications related to bowel motility Outcome: Progressing Goal: Will not experience complications related to urinary retention Outcome: Progressing   Problem: Pain Managment: Goal: General experience of comfort will improve Outcome: Progressing   Problem: Safety: Goal: Ability to remain free from injury will improve Outcome: Progressing   Problem: Skin Integrity: Goal: Risk for impaired skin integrity will decrease Outcome: Progressing   Problem: Education: Goal: Knowledge of General Education information will improve Description: Including pain rating scale, medication(s)/side effects and non-pharmacologic comfort measures Outcome: Progressing   Problem: Health Behavior/Discharge Planning: Goal: Ability to manage health-related needs will improve Outcome: Progressing   Problem: Clinical Measurements: Goal: Ability to maintain clinical measurements within normal limits will improve Outcome: Progressing Goal: Will remain free from infection Outcome: Progressing Goal: Diagnostic test results will improve Outcome: Progressing Goal: Respiratory complications will improve Outcome: Progressing Goal: Cardiovascular complication will be avoided Outcome: Progressing   Problem: Activity: Goal: Risk for activity intolerance will decrease Outcome: Progressing   Problem: Nutrition: Goal: Adequate nutrition will be maintained Outcome: Progressing   Problem: Coping: Goal: Level of anxiety will decrease Outcome: Progressing   Problem: Elimination: Goal: Will not experience complications related to bowel motility Outcome: Progressing Goal: Will not experience complications related to urinary  retention Outcome: Progressing   Problem: Pain Managment: Goal: General experience of comfort will improve Outcome: Progressing   Problem: Safety: Goal: Ability to remain free from injury will improve Outcome: Progressing   Problem: Skin Integrity: Goal: Risk for impaired skin integrity will decrease Outcome: Progressing   Problem: Education: Goal: Knowledge of the prescribed therapeutic regimen will improve Outcome: Progressing Goal: Ability to verbalize activity precautions or restrictions will improve Outcome: Progressing Goal: Understanding of discharge needs will improve Outcome: Progressing

## 2023-06-16 NOTE — Plan of Care (Signed)

## 2023-06-16 NOTE — Progress Notes (Signed)
Orthopedic Tech Progress Note Patient Details:  Patricik Bowlin [facility] 01/10/1966 [id number]  Ortho Devices Type of Ortho Device: Postop shoe/boot Ortho Device/Splint Location: LLE Ortho Device/Splint Interventions: Ordered, Application   Post Interventions Patient Tolerated: Well Instructions Provided: Care of device  Donald Pore 06/16/2023, 12:42 PM

## 2023-06-17 LAB — GLUCOSE, CAPILLARY
Glucose-Capillary: 181 mg/dL — ABNORMAL HIGH (ref 70–99)
Glucose-Capillary: 180 mg/dL — ABNORMAL HIGH (ref 70–99)
Glucose-Capillary: 136 mg/dL — ABNORMAL HIGH (ref 70–99)

## 2023-06-17 NOTE — Plan of Care (Signed)
  Problem: Education: Goal: Ability to describe self-care measures that may prevent or decrease complications (Diabetes Survival Skills Education) will improve Outcome: Progressing   Problem: Coping: Goal: Ability to adjust to condition or change in health will improve Outcome: Progressing   Problem: Fluid Volume: Goal: Ability to maintain a balanced intake and output will improve Outcome: Progressing   Problem: Activity: Goal: Risk for activity intolerance will decrease Outcome: Progressing
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
## 2023-06-17 NOTE — Progress Notes (Signed)
OT Cancellation Note  Patient Details Name: [name] MRN: [id number] DOB: 11-Jan-1966   Cancelled Treatment:    Reason Eval/Treat Not Completed: (P) Patient declined, no reason specified Pt declined OOB activities after multiple attempts, will try again tomorrow.  [name] 06/17/2023, 3:23 PM

## 2023-06-18 LAB — GLUCOSE, CAPILLARY
Glucose-Capillary: 167 mg/dL — ABNORMAL HIGH (ref 70–99)
Glucose-Capillary: 97 mg/dL (ref 70–99)
Glucose-Capillary: 116 mg/dL — ABNORMAL HIGH (ref 70–99)
Glucose-Capillary: 121 mg/dL — ABNORMAL HIGH (ref 70–99)

## 2023-06-18 NOTE — Plan of Care (Signed)
  Problem: Coping: Goal: Ability to adjust to condition or change in health will improve Outcome: Progressing   Problem: Fluid Volume: Goal: Ability to maintain a balanced intake and output will improve Outcome: Progressing   Problem: Health Behavior/Discharge Planning: Goal: Ability to identify and utilize available resources and services will improve Outcome: Progressing   Problem: Health Behavior/Discharge Planning: Goal: Ability to manage health-related needs will improve Outcome: Progressing   Problem: Skin Integrity: Goal: Risk for impaired skin integrity will decrease Outcome: Progressing   Problem: Tissue Perfusion: Goal: Adequacy of tissue perfusion will improve Outcome: Progressing

## 2023-06-18 NOTE — Progress Notes (Signed)
Physical Therapy Treatment Patient Details Name: [name] MRN: [id number] DOB: Jun 18, 1966 Today's Date: 06/18/2023   History of Present Illness Pt is a 57 year old male admitted for evaluation of being unable to get up and ambulate, and not eating for 2 days, admitted for left foot gangrene with osteomyelitis . 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 (Simultaneous filing. User may not have seen previous data.)    PT Comments  Pt tolerated treatment well today. Co-treat with OT. Pt was able to progress ambulation in hallway today. Pt was brought into hallway via recliner. Pt able to complete 3 gait trials (2 with RW and 1 with +2 HHA) with max encouragement from sister. Pt noticably fearful and benefitted from chair follow for safety. No change in DC/DME recs at this time. PT will continue to follow.    If plan is discharge home, recommend the following: A little help with walking and/or transfers;A little help with bathing/dressing/bathroom;Assistance with cooking/housework;Direct supervision/assist for medications management;Assist for transportation;Help with stairs or ramp for entrance   Can travel by private vehicle     No  Equipment Recommendations  Other (comment) (Per accepting facility)    Recommendations for Other Services       Precautions / Restrictions Precautions Precautions: Fall (Simultaneous filing. User may not have seen previous data.) Required Braces or Orthoses: Other Brace Other Brace: L post op shoe Restrictions Weight Bearing Restrictions: Yes (Simultaneous filing. User may not have seen previous data.) LLE Weight Bearing: Weight bearing as tolerated (Simultaneous filing. User may not have seen previous data.) Other Position/Activity Restrictions: Upgraded to WBAT with darco shoe. (Simultaneous filing.  User may not have seen previous data.)     Mobility  Bed Mobility Overal bed mobility: Modified Independent (Simultaneous filing. User may not have seen previous data.)             General bed mobility comments: cueing for intiation/safety (Simultaneous filing. User may not have seen previous data.)    Transfers Overall transfer level: Needs assistance (Simultaneous filing. User may not have seen previous data.) Equipment used: Rolling walker (2 wheels), 2 person hand held assist (Simultaneous filing. User may not have seen previous data.) Transfers: Sit to/from Stand (Simultaneous filing. User may not have seen previous data.) Sit to Stand: Min assist (Simultaneous filing. User may not have seen previous data.)           General transfer comment: pt able to perform multiple sit to stands from bed and chair today. Pt wheeled out into hallway for gait progression today. (Simultaneous filing. User may not have seen previous data.)    Ambulation/Gait Ambulation/Gait assistance: Mod assist, +2 safety/equipment, +2 physical assistance Gait Distance (Feet): 100 Feet (25+40+35) Assistive device: Rolling walker (2 wheels), 2 person hand held assist Gait Pattern/deviations: Shuffle, Staggering left, Staggering right, Drifts right/left, Decreased stride length, Step-through pattern (Walks on toes at times) Gait velocity: decreased     General Gait Details: Pt was brought into hallway via recliner. Pt able to complete 3 gait trials (2 with RW and 1 with +2 HHA) with max encouragement from sister. Pt noticably fearful and benefitted from chair follow for safety. no LOB noted but pt noted to ambulated on toes of R foor. Cues for safety and proximity to RW.   Stairs             Psychologist, prison and probation services  Tilt Bed    Modified Rankin (Stroke Patients Only)       Balance Overall balance assessment: Needs assistance Sitting-balance support: No upper extremity supported, Feet  supported (Simultaneous filing. User may not have seen previous data.) Sitting balance-Leahy Scale: Normal (Simultaneous filing. User may not have seen previous data.)     Standing balance support: Bilateral upper extremity supported, During functional activity (Simultaneous filing. User may not have seen previous data.) Standing balance-Leahy Scale: Poor (Simultaneous filing. User may not have seen previous data.) Standing balance comment: reliant on RW and 2 person HHA, poor safety awareness, (Simultaneous filing. User may not have seen previous data.)                            Cognition Arousal: Alert (Simultaneous filing. User may not have seen previous data.) Behavior During Therapy: Flat affect, Impulsive (Simultaneous filing. User may not have seen previous data.) Overall Cognitive Status: History of cognitive impairments - at baseline (Simultaneous filing. User may not have seen previous data.)                                 General Comments: Per chart pt has history of TBI with residual deficits. Difficulty following commands at times. Difficulty sequencing steps, not able to stand  while maintaining TDWB or correctly/ intuitively use DME (Simultaneous filing. User may not have seen previous data.)        Exercises      General Comments General comments (skin integrity, edema, etc.): VSS      Pertinent Vitals/Pain Pain Assessment Pain Assessment: No/denies pain (Simultaneous filing. User may not have seen previous data.)    Home Living                          Prior Function            PT Goals (current goals can now be found in the care plan section) Progress towards PT goals: Progressing toward goals    Frequency    Min 1X/week      PT Plan Current plan remains appropriate    Co-evaluation PT/OT/SLP Co-Evaluation/Treatment: Yes Reason for Co-Treatment: Necessary to address cognition/behavior during functional  activity;For patient/therapist safety (Simultaneous filing. User may not have seen previous data.) PT goals addressed during session: Mobility/safety with mobility;Proper use of DME OT goals addressed during session: ADL's and self-care;Proper use of Adaptive equipment and DME (Simultaneous filing. User may not have seen previous data.)      AM-PAC PT "6 Clicks" Mobility   Outcome Measure  Help needed turning from your back to your side while in a flat bed without using bedrails?: None Help needed moving from lying on your back to sitting on the side of a flat bed without using bedrails?: None Help needed moving to and from a bed to a chair (including a wheelchair)?: A Little Help needed standing up from a chair using your arms (e.g., wheelchair or bedside chair)?: A Little Help needed to walk in hospital room?: A Lot Help needed climbing 3-5 steps with a railing? : Total 6 Click Score: 17    End of Session Equipment Utilized During Treatment: Gait belt Activity Tolerance: Patient tolerated treatment well Patient left: in bed;with call bell/phone within reach;with bed alarm set;with family/visitor present Nurse Communication: Mobility status PT Visit Diagnosis: Other abnormalities of gait  and mobility (R26.89)     Time: 9147-8295 PT Time Calculation (min) (ACUTE ONLY): 25 min  Charges:    $Gait Training: 8-22 mins PT General Charges $$ ACUTE PT VISIT: 1 Visit                     Shela Nevin, PT, DPT Acute Rehab Services [phone]    [name] 06/18/2023, 4:05 PM

## 2023-06-18 NOTE — Plan of Care (Signed)
Problem: Education: Goal: Ability to describe self-care measures that may prevent or decrease complications (Diabetes Survival Skills Education) will improve Outcome: Progressing Goal: Individualized Educational Video(s) Outcome: Progressing   Problem: Coping: Goal: Ability to adjust to condition or change in health will improve Outcome: Progressing   Problem: Fluid Volume: Goal: Ability to maintain a balanced intake and output will improve Outcome: Progressing   Problem: Health Behavior/Discharge Planning: Goal: Ability to identify and utilize available resources and services will improve Outcome: Progressing Goal: Ability to manage health-related needs will improve Outcome: Progressing   Problem: Metabolic: Goal: Ability to maintain appropriate glucose levels will improve Outcome: Progressing   Problem: Nutritional: Goal: Progress toward achieving an optimal weight will improve Outcome: Progressing   Problem: Skin Integrity: Goal: Risk for impaired skin integrity will decrease Outcome: Progressing   Problem: Tissue Perfusion: Goal: Adequacy of tissue perfusion will improve Outcome: Progressing   Problem: Education: Goal: Knowledge of General Education information will improve Description: Including pain rating scale, medication(s)/side effects and non-pharmacologic comfort measures Outcome: Progressing   Problem: Health Behavior/Discharge Planning: Goal: Ability to manage health-related needs will improve Outcome: Progressing   Problem: Clinical Measurements: Goal: Ability to maintain clinical measurements within normal limits will improve Outcome: Progressing Goal: Will remain free from infection Outcome: Progressing Goal: Diagnostic test results will improve Outcome: Progressing Goal: Respiratory complications will improve Outcome: Progressing Goal: Cardiovascular complication will be avoided Outcome: Progressing   Problem: Activity: Goal: Risk for  activity intolerance will decrease Outcome: Progressing   Problem: Nutrition: Goal: Adequate nutrition will be maintained Outcome: Progressing   Problem: Coping: Goal: Level of anxiety will decrease Outcome: Progressing   Problem: Elimination: Goal: Will not experience complications related to bowel motility Outcome: Progressing Goal: Will not experience complications related to urinary retention Outcome: Progressing   Problem: Pain Managment: Goal: General experience of comfort will improve Outcome: Progressing   Problem: Safety: Goal: Ability to remain free from injury will improve Outcome: Progressing   Problem: Skin Integrity: Goal: Risk for impaired skin integrity will decrease Outcome: Progressing   Problem: Education: Goal: Knowledge of General Education information will improve Description: Including pain rating scale, medication(s)/side effects and non-pharmacologic comfort measures Outcome: Progressing   Problem: Health Behavior/Discharge Planning: Goal: Ability to manage health-related needs will improve Outcome: Progressing   Problem: Clinical Measurements: Goal: Ability to maintain clinical measurements within normal limits will improve Outcome: Progressing Goal: Will remain free from infection Outcome: Progressing Goal: Diagnostic test results will improve Outcome: Progressing Goal: Respiratory complications will improve Outcome: Progressing Goal: Cardiovascular complication will be avoided Outcome: Progressing   Problem: Activity: Goal: Risk for activity intolerance will decrease Outcome: Progressing   Problem: Nutrition: Goal: Adequate nutrition will be maintained Outcome: Progressing   Problem: Coping: Goal: Level of anxiety will decrease Outcome: Progressing   Problem: Elimination: Goal: Will not experience complications related to bowel motility Outcome: Progressing Goal: Will not experience complications related to urinary  retention Outcome: Progressing   Problem: Pain Managment: Goal: General experience of comfort will improve Outcome: Progressing   Problem: Safety: Goal: Ability to remain free from injury will improve Outcome: Progressing   Problem: Skin Integrity: Goal: Risk for impaired skin integrity will decrease Outcome: Progressing   Problem: Education: Goal: Knowledge of the prescribed therapeutic regimen will improve Outcome: Progressing Goal: Ability to verbalize activity precautions or restrictions will improve Outcome: Progressing Goal: Understanding of discharge needs will improve Outcome: Progressing   Problem: Activity: Goal: Ability to perform//tolerate increased activity and mobilize with assistive devices  will improve Outcome: Progressing   Problem: Clinical Measurements: Goal: Postoperative complications will be avoided or minimized Outcome: Progressing

## 2023-06-18 NOTE — TOC Progression Note (Signed)
Transition of Care [facility]) - Progression Note    Patient Details  Name: [name] MRN: [id number] Date of Birth: June 11, 1966  [facility] CM/SW Contact  Lorri Frederick, LCSW Phone Number: 06/18/2023, 8:35 AM  Clinical Narrative:   Email from Marylene Land Roberson/APS: she will be discussing this case with APS guardianship team next Tuesday.  Will provide decision on pursuing guardianship after that meeting.    Expected Discharge Plan: Home w Home Health Services Barriers to Discharge: No Barriers Identified  Expected Discharge Plan and Services       Living arrangements for the past 2 months: Single Family Home                                       Social Determinants of Health (SDOH) Interventions SDOH Screenings   Depression (PHQ2-9): Low Risk  (03/11/2023)  Tobacco Use: Low Risk  (06/02/2023)    Readmission Risk Interventions     No data to display

## 2023-06-18 NOTE — Progress Notes (Signed)
I assisted Lyndel Safe CM/SW with some interpretations, questions and evaluation, ordered pt meals by Orlan Leavens Spanish Medical Interpreter.

## 2023-06-18 NOTE — Progress Notes (Signed)
Occupational Therapy Treatment Patient Details Name: [name] MRN: [id number] DOB: [birth year] Today's Date: 06/18/2023   History of present illness Pt is a 57 year old male admitted for evaluation of being unable to get up and ambulate, and not eating for 2 days, admitted for left foot gangrene with osteomyelitis . 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   OT comments  Pt transferred to chair and taken to hall for ambulation to decrease his tendency to want to return to bed. Able to ambulate with RW and mod to max assist and chair follow. Pt took 2 seated rest breaks. +2 mod hand held assist for last bout. Pt stating he preferred RW. Sister participated and interpreted. Pt cooperative and grateful at end of session. Educated sister that WB status has been increased to as tolerated. Pt does not complain or indicate pain.      If plan is discharge home, recommend the following:  A lot of help with walking and/or transfers;A lot of help with bathing/dressing/bathroom;Assistance with cooking/housework;Direct supervision/assist for medications management;Assist for transportation;Help with stairs or ramp for entrance   Equipment Recommendations  Other (comment) (defer)    Recommendations for Other Services      Precautions / Restrictions Precautions Precautions: Fall Required Braces or Orthoses: Other Brace Other Brace: L post op shoe Restrictions Weight Bearing Restrictions: Yes LLE Weight Bearing: Weight bearing as tolerated Other Position/Activity Restrictions: MD increased WB status in post op shoe, has a sneaker for his R foot       Mobility Bed Mobility Overal bed mobility: Modified Independent             General bed mobility comments: multimodal cueing for intiation/safety    Transfers Overall transfer  level: Needs assistance Equipment used: Rolling walker (2 wheels), 2 person hand held assist Transfers: Sit to/from Stand Sit to Stand: +2 safety/equipment, +2 physical assistance, Min assist Stand pivot transfers: Min assist         General transfer comment: assist to rise and steady, pt with poor motor control and ability to guide walker simultaneously, +2 mod assist to ambulate with B hand held assist, pt with tendency to sit impulsively before aligned with bed/chair     Balance   Sitting-balance support: No upper extremity supported, Feet supported Sitting balance-Leahy Scale: Normal     Standing balance support: Bilateral upper extremity supported, During functional activity Standing balance-Leahy Scale: Poor Standing balance comment: reliant on RW, poor safety awareness, prefers walker to B HHA                           ADL either performed or assessed with clinical judgement   ADL Overall ADL's : Needs assistance/impaired Eating/Feeding: Set up;Bed level Eating/Feeding Details (indicate cue type and reason): drinking juice             Upper Body Dressing : Minimal assistance;Sitting Upper Body Dressing Details (indicate cue type and reason): front opening gown                 Functional mobility during ADLs: Moderate assistance;Maximal assistance;+2 for safety/equipment;Rolling walker (2 wheels) General ADL Comments: Pt with tendency to want to return to his bed when ambulating in room, transferred bed to recliner and rolled into hall for ambulation trials and chair following.    Extremity/Trunk Assessment  Vision       Perception     Praxis      Cognition Arousal: Alert Behavior During Therapy: Impulsive Overall Cognitive Status: History of cognitive impairments - at baseline                                 General Comments: requires repetition and multimodal cues for direction following with sister  interpreting        Exercises      Shoulder Instructions       General Comments      Pertinent Vitals/ Pain       Pain Assessment Pain Assessment: Faces Faces Pain Scale: No hurt  Home Living                                          Prior Functioning/Environment              Frequency  Min 1X/week        Progress Toward Goals  OT Goals(current goals can now be found in the care plan section)  Progress towards OT goals: Progressing toward goals  Acute Rehab OT Goals OT Goal Formulation: Patient unable to participate in goal setting Time For Goal Achievement: 06/29/23 Potential to Achieve Goals: Good  Plan      Co-evaluation    PT/OT/SLP Co-Evaluation/Treatment: Yes Reason for Co-Treatment: For patient/therapist safety   OT goals addressed during session: Strengthening/ROM;ADL's and self-care      AM-PAC OT "6 Clicks" Daily Activity     Outcome Measure   Help from another person eating meals?: None Help from another person taking care of personal grooming?: A Little Help from another person toileting, which includes using toliet, bedpan, or urinal?: A Lot Help from another person bathing (including washing, rinsing, drying)?: A Lot Help from another person to put on and taking off regular upper body clothing?: A Little Help from another person to put on and taking off regular lower body clothing?: A Lot 6 Click Score: 16    End of Session Equipment Utilized During Treatment: Gait belt;Rolling walker (2 wheels);Other (comment) (post op shoe)  OT Visit Diagnosis: Unsteadiness on feet (R26.81);Other abnormalities of gait and mobility (R26.89);Muscle weakness (generalized) (M62.81);Other symptoms and signs involving cognitive function   Activity Tolerance Patient tolerated treatment well   Patient Left in bed;with call bell/phone within reach;with bed alarm set;with family/visitor present   Nurse Communication          Time:  1478-2956 OT Time Calculation (min): 26 min  Charges: OT General Charges $OT Visit: 1 Visit OT Treatments $Therapeutic Activity: 8-22 mins  Berna Spare, OTR/L Acute Rehabilitation Services Office: [phone]   Evern Bio 06/18/2023, 3:59 PM
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
## 2023-06-19 LAB — GLUCOSE, CAPILLARY
Glucose-Capillary: 133 mg/dL — ABNORMAL HIGH (ref 70–99)
Glucose-Capillary: 115 mg/dL — ABNORMAL HIGH (ref 70–99)
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
## 2023-06-19 NOTE — Plan of Care (Signed)
  Problem: Education: Goal: Ability to describe self-care measures that may prevent or decrease complications (Diabetes Survival Skills Education) will improve Outcome: Progressing Goal: Individualized Educational Video(s) Outcome: Progressing   Problem: Coping: Goal: Ability to adjust to condition or change in health will improve Outcome: Progressing   

## 2023-06-19 NOTE — Plan of Care (Signed)
  Problem: Coping: Goal: Ability to adjust to condition or change in health will improve Outcome: Progressing   Problem: Fluid Volume: Goal: Ability to maintain a balanced intake and output will improve Outcome: Progressing   Problem: Metabolic: Goal: Ability to maintain appropriate glucose levels will improve Outcome: Progressing   Problem: Skin Integrity: Goal: Risk for impaired skin integrity will decrease Outcome: Progressing   Problem: Education: Goal: Knowledge of General Education information will improve Description: Including pain rating scale, medication(s)/side effects and non-pharmacologic comfort measures Outcome: Progressing   Problem: Activity: Goal: Risk for activity intolerance will decrease Outcome: Progressing

## 2023-06-20 LAB — GLUCOSE, CAPILLARY
Glucose-Capillary: 140 mg/dL — ABNORMAL HIGH (ref 70–99)
Glucose-Capillary: 117 mg/dL — ABNORMAL HIGH (ref 70–99)
Glucose-Capillary: 107 mg/dL — ABNORMAL HIGH (ref 70–99)
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
## 2023-06-20 NOTE — Plan of Care (Signed)
  Problem: Fluid Volume: Goal: Ability to maintain a balanced intake and output will improve Outcome: Completed/Met

## 2023-06-21 LAB — GLUCOSE, CAPILLARY
Glucose-Capillary: 172 mg/dL — ABNORMAL HIGH (ref 70–99)
Glucose-Capillary: 205 mg/dL — ABNORMAL HIGH (ref 70–99)
Glucose-Capillary: 217 mg/dL — ABNORMAL HIGH (ref 70–99)
Glucose-Capillary: 170 mg/dL — ABNORMAL HIGH (ref 70–99)

## 2023-06-21 NOTE — Plan of Care (Signed)
  Problem: Metabolic: Goal: Ability to maintain appropriate glucose levels will improve Outcome: Progressing   Problem: Clinical Measurements: Goal: Will remain free from infection Outcome: Progressing
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
## 2023-06-22 LAB — GLUCOSE, CAPILLARY
Glucose-Capillary: 128 mg/dL — ABNORMAL HIGH (ref 70–99)
Glucose-Capillary: 91 mg/dL (ref 70–99)
Glucose-Capillary: 138 mg/dL — ABNORMAL HIGH (ref 70–99)
Glucose-Capillary: 163 mg/dL — ABNORMAL HIGH (ref 70–99)
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
## 2023-06-22 NOTE — Progress Notes (Signed)
Patient ID: [name], male   DOB: [birth year], 57 y.o.   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  The skin incision is well-approximated there is no cellulitis or drainage.  Patient may wash the wound with soap and water.  Change dressing as needed.  Weightbearing as necessary on the left heel.  Will remove the sutures in a week or 2.

## 2023-06-22 NOTE — Progress Notes (Signed)
Occupational Therapy Treatment Patient Details Name: [name] MRN: [id number] DOB: [birth year] Today's Date: 06/22/2023   History of present illness Pt is a 57 year old male admitted for evaluation of being unable to get up and ambulate, and not eating for 2 days, admitted for left foot gangrene with osteomyelitis . 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   OT comments  Co-tx completed for therapist safety as pt has a history of negative behaviors. Phone interpreter used during session. Interpreter: Darcel Bayley [phone]. Session focused on functional mobility and standing balance/tolerance. Pt required primarily cueing to initiate ADL tasks and for sequencing and/or thoroughness. Min A-CGA provided for sit to stand. Pt tends to use momentum to power up into standing and does not have good dynamic standing balance control. Poor RW management and use demonstrated during session. Due to recent WB restrictions upgraded to [facility], I would like to see if RW is actually needed for balance at next session.       If plan is discharge home, recommend the following:  A little help with walking and/or transfers;A little help with bathing/dressing/bathroom;Assistance with cooking/housework;Assist for transportation   Equipment Recommendations  Other (comment) (defer to next venue of care)       Precautions / Restrictions Precautions Precautions: Fall;Other (comment) Precaution Comments: History of TBI with left side weakness and cognitive impairments Required Braces or Orthoses: Other Brace Other Brace: L post op shoe Restrictions Weight Bearing Restrictions: Yes LLE Weight Bearing: Weight bearing as tolerated Other Position/Activity Restrictions: Upgraded to WBAT with darco shoe.       Mobility Bed Mobility Overal bed mobility: Modified  Independent    General bed mobility comments: cueing for intiation    Transfers Overall transfer level: Needs assistance Equipment used: Rolling walker (2 wheels), None Transfers: Sit to/from Stand, Bed to chair/wheelchair/BSC Sit to Stand: Min assist     Step pivot transfers: Contact guard assist     General transfer comment: Pt completed multiple sit to stands throughout session from bed, recliner, and toilet. VC and visual cues provided to initiate task and for hand placement with RW management. Pt demonstrated poor management of RW while keeping it pushing out too far when walking and attempting to turn and walk from sink to toilet although did not bring RW along with him due to urgency. Increased cueing and physical assist required due to cognitive impairments and decreased safety with DME use.     Balance Overall balance assessment: Needs assistance Sitting-balance support: No upper extremity supported, Feet supported Sitting balance-Leahy Scale: Normal Sitting balance - Comments: EOM ADL completion   Standing balance support: Bilateral upper extremity supported, During functional activity Standing balance-Leahy Scale: Good Standing balance comment: Able to stand with RW pushed up against sink. Pt stood ~2 feet away from sink instead of standing close and was able to flex forward and reach to turn water on and off without support or LOB.      ADL either performed or assessed with clinical judgement   ADL       Grooming: Supervision/safety;Standing;Cueing for sequencing;Oral care;Wash/dry hands           Upper Body Dressing : Set up;Sitting;Cueing for sequencing Upper Body Dressing Details (indicate cue type and reason): Hospital gown change while seated on toilet Lower Body Dressing: Moderate assistance;Bed level;Sitting/lateral leans (total assist to don post op shoe. Pt able to don tennis  shoe while seated EOB.)   Toilet Transfer: Contact guard assist;Regular  Toilet;Rolling walker (2 wheels);Grab bars   Toileting- Clothing Manipulation and Hygiene: Supervision/safety;Sitting/lateral lean       Functional mobility during ADLs: Contact guard assist;Rolling walker (2 wheels)        Cognition Arousal: Alert Behavior During Therapy: Flat affect, Impulsive Overall Cognitive Status: History of cognitive impairments - at baseline                          Pertinent Vitals/ Pain       Pain Assessment Pain Assessment: No/denies pain         Frequency  Min 1X/week        Progress Toward Goals  OT Goals(current goals can now be found in the care plan section)  Progress towards OT goals: Progressing toward goals         Co-evaluation    PT/OT/SLP Co-Evaluation/Treatment: Yes Reason for Co-Treatment: Necessary to address cognition/behavior during functional activity;For patient/therapist safety   OT goals addressed during session: ADL's and self-care;Proper use of Adaptive equipment and DME      AM-PAC OT "6 Clicks" Daily Activity     Outcome Measure   Help from another person eating meals?: None Help from another person taking care of personal grooming?: A Little Help from another person toileting, which includes using toliet, bedpan, or urinal?: A Lot Help from another person bathing (including washing, rinsing, drying)?: A Lot Help from another person to put on and taking off regular upper body clothing?: A Little Help from another person to put on and taking off regular lower body clothing?: A Lot 6 Click Score: 16    End of Session Equipment Utilized During Treatment: Gait belt;Rolling walker (2 wheels);Other (comment) (post op shoe)  OT Visit Diagnosis: Unsteadiness on feet (R26.81);Other abnormalities of gait and mobility (R26.89);Muscle weakness (generalized) (M62.81);Other symptoms and signs involving cognitive function Pain - Right/Left: Left Pain - part of body: Ankle and joints of foot   Activity  Tolerance Patient tolerated treatment well   Patient Left in bed;with call bell/phone within reach;with bed alarm set           Time: 1120-1146 OT Time Calculation (min): 26 min  Charges: OT General Charges $OT Visit: 1 Visit OT Treatments $Self Care/Home Management : 8-22 mins  Limmie Patricia, OTR/L,CBIS  Supplemental OT - [facility] and [facility] Secure Chat Preferred    Klover Priestly, Charisse March 06/22/2023, 12:01 PM

## 2023-06-22 NOTE — Plan of Care (Signed)
  Problem: Education: Goal: Knowledge of General Education information will improve Description: Including pain rating scale, medication(s)/side effects and non-pharmacologic comfort measures Outcome: Progressing   Problem: Health Behavior/Discharge Planning: Goal: Ability to manage health-related needs will improve Outcome: Progressing   Problem: Activity: Goal: Risk for activity intolerance will decrease Outcome: Progressing   

## 2023-06-22 NOTE — Progress Notes (Signed)
Physical Therapy Treatment Patient Details Name: [name] MRN: [id number] DOB: 1966/08/07 Today's Date: 06/22/2023   History of Present Illness Pt is a 57 year old male admitted for evaluation of being unable to get up and ambulate, and not eating for 2 days, admitted for left foot gangrene with osteomyelitis . 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    PT Comments  Pt tolerated treatment well today. Co-treat with OT. Pt was able to progress ambulation today in hallway with RW CGA with a chair follow. No change in DC/DME recs at this time. PT will continue to follow.    If plan is discharge home, recommend the following: A little help with walking and/or transfers;A little help with bathing/dressing/bathroom;Assistance with cooking/housework;Direct supervision/assist for medications management;Assist for transportation;Help with stairs or ramp for entrance   Can travel by private vehicle     No  Equipment Recommendations  Other (comment) (Per accepting facility)    Recommendations for Other Services       Precautions / Restrictions Precautions Precautions: Fall;Other (comment) Precaution Comments: History of TBI with left side weakness and cognitive impairments Required Braces or Orthoses: Other Brace Other Brace: L post op shoe Restrictions Weight Bearing Restrictions: Yes LLE Weight Bearing: Weight bearing as tolerated Other Position/Activity Restrictions: Upgraded to WBAT with darco shoe.     Mobility  Bed Mobility Overal bed mobility: Modified Independent             General bed mobility comments: cueing for intiation    Transfers Overall transfer level: Needs assistance Equipment used: Rolling walker (2 wheels), None Transfers: Sit to/from Stand, Bed to chair/wheelchair/BSC Sit to Stand: Min assist    Step pivot transfers: Contact guard assist       General transfer comment: Pt completed multiple sit to stands throughout session from bed, recliner, and toilet. VC and visual cues provided to initiate task and for hand placement with RW management. Pt demonstrated poor management of RW while keeping it pushing out too far when walking and attempting to turn and walk from sink to toilet although did not bring RW along with him due to urgency. Increased cueing and physical assist required due to cognitive impairments and decreased safety with DME use.    Ambulation/Gait Ambulation/Gait assistance: Contact guard assist, +2 safety/equipment Gait Distance (Feet): 120 Feet (60+60) Assistive device: Rolling walker (2 wheels) Gait Pattern/deviations: Shuffle, Staggering left, Staggering right, Drifts right/left, Decreased stride length, Step-through pattern Gait velocity: decreased     General Gait Details: Pt was brought into hallway via recliner. Pt able to complete 2 gait trials with RW CGA and a chair follow. Cues for safety and proximity to RW. Pt not as fearful today with mobility and appeared to be eager for mobility.   Stairs             Wheelchair Mobility     Tilt Bed    Modified Rankin (Stroke Patients Only)       Balance Overall balance assessment: Needs assistance Sitting-balance support: No upper extremity supported, Feet supported Sitting balance-Leahy Scale: Normal Sitting balance - Comments: EOM ADL completion   Standing balance support: Bilateral upper extremity supported, During functional activity Standing balance-Leahy Scale: Good Standing balance comment: Able to stand with RW pushed up against sink. Pt stood ~2 feet away from sink instead of standing close and was able to flex forward and reach to  turn water on and off without support or LOB.                            Cognition Arousal: Alert Behavior During Therapy: Flat affect,  Impulsive Overall Cognitive Status: History of cognitive impairments - at baseline                                 General Comments: Per chart pt has history of TBI with residual deficits. Difficulty following commands at times. Difficulty sequencing steps, not able to stand  while maintaining TDWB or correctly/ intuitively use DME        Exercises      General Comments General comments (skin integrity, edema, etc.): VSS      Pertinent Vitals/Pain Pain Assessment Pain Assessment: No/denies pain    Home Living                          Prior Function            PT Goals (current goals can now be found in the care plan section) Progress towards PT goals: Progressing toward goals    Frequency    Min 1X/week      PT Plan Current plan remains appropriate    Co-evaluation PT/OT/SLP Co-Evaluation/Treatment: Yes Reason for Co-Treatment: Necessary to address cognition/behavior during functional activity;For patient/therapist safety PT goals addressed during session: Mobility/safety with mobility;Proper use of DME OT goals addressed during session: ADL's and self-care;Proper use of Adaptive equipment and DME      AM-PAC PT "6 Clicks" Mobility   Outcome Measure  Help needed turning from your back to your side while in a flat bed without using bedrails?: None Help needed moving from lying on your back to sitting on the side of a flat bed without using bedrails?: None Help needed moving to and from a bed to a chair (including a wheelchair)?: A Little Help needed standing up from a chair using your arms (e.g., wheelchair or bedside chair)?: A Little Help needed to walk in hospital room?: A Lot Help needed climbing 3-5 steps with a railing? : Total 6 Click Score: 17    End of Session Equipment Utilized During Treatment: Gait belt Activity Tolerance: Patient tolerated treatment well Patient left: in bed;with call bell/phone within reach;with bed  alarm set;with family/visitor present Nurse Communication: Mobility status PT Visit Diagnosis: Other abnormalities of gait and mobility (R26.89)     Time: 5732-2025 PT Time Calculation (min) (ACUTE ONLY): 28 min  Charges:    $Gait Training: 8-22 mins PT General Charges $$ ACUTE PT VISIT: 1 Visit                     Shela Nevin, PT, DPT Acute Rehab Services [phone]    [name] 06/22/2023, 3:26 PM

## 2023-06-23 LAB — BASIC METABOLIC PANEL
Anion gap: 6 (ref 5–15)
BUN: 34 mg/dL — ABNORMAL HIGH (ref 6–20)
CO2: 27 mmol/L (ref 22–32)
Calcium: 8.5 mg/dL — ABNORMAL LOW (ref 8.9–10.3)
Chloride: 101 mmol/L (ref 98–111)
Creatinine, Ser: 1.21 mg/dL (ref 0.61–1.24)
GFR, Estimated: >60 mL/min (ref >60)
Glucose, Bld: 86 mg/dL (ref 70–99)
Potassium: 4.4 mmol/L (ref 3.5–5.1)
Sodium: 134 mmol/L — ABNORMAL LOW (ref 135–145)

## 2023-06-23 LAB — CBC
HCT: 26.7 % — ABNORMAL LOW (ref 39.0–52.0)
Hemoglobin: 9.1 g/dL — ABNORMAL LOW (ref 13.0–17.0)
MCH: 30.3 pg (ref 26.0–34.0)
MCHC: 34.1 g/dL (ref 30.0–36.0)
MCV: 89 fL (ref 80.0–100.0)
Platelets: 231 10*3/uL (ref 150–400)
RBC: 3 MIL/uL — ABNORMAL LOW (ref 4.22–5.81)
RDW: 13.2 % (ref 11.5–15.5)
WBC: 5.1 10*3/uL (ref 4.0–10.5)
nRBC: 0 % (ref 0.0–0.2)

## 2023-06-23 LAB — GLUCOSE, CAPILLARY
Glucose-Capillary: 126 mg/dL — ABNORMAL HIGH (ref 70–99)
Glucose-Capillary: 101 mg/dL — ABNORMAL HIGH (ref 70–99)
Glucose-Capillary: 156 mg/dL — ABNORMAL HIGH (ref 70–99)
Glucose-Capillary: 126 mg/dL — ABNORMAL HIGH (ref 70–99)

## 2023-06-23 NOTE — Progress Notes (Signed)
Check on pt need, assisted RN with interpretation about medication by Orlan Leavens Spanish Medical Interpreter.

## 2023-06-23 NOTE — Plan of Care (Signed)
Problem: Education: Goal: Ability to describe self-care measures that may prevent or decrease complications (Diabetes Survival Skills Education) will improve Outcome: Progressing Goal: Individualized Educational Video(s) Outcome: Progressing   Problem: Coping: Goal: Ability to adjust to condition or change in health will improve Outcome: Progressing   Problem: Health Behavior/Discharge Planning: Goal: Ability to identify and utilize available resources and services will improve Outcome: Progressing Goal: Ability to manage health-related needs will improve Outcome: Progressing   Problem: Metabolic: Goal: Ability to maintain appropriate glucose levels will improve Outcome: Progressing   Problem: Nutritional: Goal: Progress toward achieving an optimal weight will improve Outcome: Progressing   Problem: Skin Integrity: Goal: Risk for impaired skin integrity will decrease Outcome: Progressing   Problem: Tissue Perfusion: Goal: Adequacy of tissue perfusion will improve Outcome: Progressing   Problem: Education: Goal: Knowledge of General Education information will improve Description: Including pain rating scale, medication(s)/side effects and non-pharmacologic comfort measures Outcome: Progressing   Problem: Health Behavior/Discharge Planning: Goal: Ability to manage health-related needs will improve Outcome: Progressing   Problem: Clinical Measurements: Goal: Ability to maintain clinical measurements within normal limits will improve Outcome: Progressing Goal: Will remain free from infection Outcome: Progressing Goal: Diagnostic test results will improve Outcome: Progressing Goal: Respiratory complications will improve Outcome: Progressing Goal: Cardiovascular complication will be avoided Outcome: Progressing   Problem: Activity: Goal: Risk for activity intolerance will decrease Outcome: Progressing   Problem: Nutrition: Goal: Adequate nutrition will be  maintained Outcome: Progressing   Problem: Coping: Goal: Level of anxiety will decrease Outcome: Progressing   Problem: Elimination: Goal: Will not experience complications related to bowel motility Outcome: Progressing Goal: Will not experience complications related to urinary retention Outcome: Progressing   Problem: Pain Managment: Goal: General experience of comfort will improve Outcome: Progressing   Problem: Safety: Goal: Ability to remain free from injury will improve Outcome: Progressing   Problem: Skin Integrity: Goal: Risk for impaired skin integrity will decrease Outcome: Progressing   Problem: Education: Goal: Knowledge of General Education information will improve Description: Including pain rating scale, medication(s)/side effects and non-pharmacologic comfort measures Outcome: Progressing   Problem: Health Behavior/Discharge Planning: Goal: Ability to manage health-related needs will improve Outcome: Progressing   Problem: Clinical Measurements: Goal: Ability to maintain clinical measurements within normal limits will improve Outcome: Progressing Goal: Will remain free from infection Outcome: Progressing Goal: Diagnostic test results will improve Outcome: Progressing Goal: Respiratory complications will improve Outcome: Progressing Goal: Cardiovascular complication will be avoided Outcome: Progressing   Problem: Activity: Goal: Risk for activity intolerance will decrease Outcome: Progressing   Problem: Nutrition: Goal: Adequate nutrition will be maintained Outcome: Progressing   Problem: Coping: Goal: Level of anxiety will decrease Outcome: Progressing   Problem: Elimination: Goal: Will not experience complications related to bowel motility Outcome: Progressing Goal: Will not experience complications related to urinary retention Outcome: Progressing   Problem: Pain Managment: Goal: General experience of comfort will improve Outcome:  Progressing   Problem: Safety: Goal: Ability to remain free from injury will improve Outcome: Progressing   Problem: Skin Integrity: Goal: Risk for impaired skin integrity will decrease Outcome: Progressing   Problem: Education: Goal: Knowledge of the prescribed therapeutic regimen will improve Outcome: Progressing Goal: Ability to verbalize activity precautions or restrictions will improve Outcome: Progressing Goal: Understanding of discharge needs will improve Outcome: Progressing   Problem: Activity: Goal: Ability to perform//tolerate increased activity and mobilize with assistive devices will improve Outcome: Progressing   Problem: Clinical Measurements: Goal: Postoperative complications will be avoided or minimized Outcome:  Progressing   Problem: Self-Care: Goal: Ability to meet self-care needs will improve Outcome: Progressing

## 2023-06-23 NOTE — TOC Progression Note (Signed)
Transition of Care [facility]) - Progression Note    Patient Details  Name: [name] MRN: [id number] Date of Birth: [birth year]  [facility]) CM/SW Contact  Lorri Frederick, LCSW Phone Number: 06/23/2023, 3:47 PM  Clinical Narrative:   PASSR received: [phone] F.  Expires on 07/21/23.    Expected Discharge Plan: Home w Home Health Services Barriers to Discharge: No Barriers Identified  Expected Discharge Plan and Services       Living arrangements for the past 2 months: Single Family Home                                       Social Determinants of Health (SDOH) Interventions SDOH Screenings   Depression (PHQ2-9): Low Risk  (03/11/2023)  Tobacco Use: Low Risk  (06/02/2023)    Readmission Risk Interventions     No data to display
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
## 2023-06-24 LAB — GLUCOSE, CAPILLARY
Glucose-Capillary: 161 mg/dL — ABNORMAL HIGH (ref 70–99)
Glucose-Capillary: 127 mg/dL — ABNORMAL HIGH (ref 70–99)
Glucose-Capillary: 136 mg/dL — ABNORMAL HIGH (ref 70–99)
Glucose-Capillary: 121 mg/dL — ABNORMAL HIGH (ref 70–99)
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
## 2023-06-24 NOTE — Plan of Care (Signed)
Problem: Education: Goal: Ability to describe self-care measures that may prevent or decrease complications (Diabetes Survival Skills Education) will improve Outcome: Progressing Goal: Individualized Educational Video(s) Outcome: Progressing   Problem: Coping: Goal: Ability to adjust to condition or change in health will improve Outcome: Progressing   Problem: Health Behavior/Discharge Planning: Goal: Ability to identify and utilize available resources and services will improve Outcome: Progressing Goal: Ability to manage health-related needs will improve Outcome: Progressing   Problem: Metabolic: Goal: Ability to maintain appropriate glucose levels will improve Outcome: Progressing   Problem: Nutritional: Goal: Progress toward achieving an optimal weight will improve Outcome: Progressing   Problem: Skin Integrity: Goal: Risk for impaired skin integrity will decrease Outcome: Progressing   Problem: Tissue Perfusion: Goal: Adequacy of tissue perfusion will improve Outcome: Progressing   Problem: Education: Goal: Knowledge of General Education information will improve Description: Including pain rating scale, medication(s)/side effects and non-pharmacologic comfort measures Outcome: Progressing   Problem: Health Behavior/Discharge Planning: Goal: Ability to manage health-related needs will improve Outcome: Progressing   Problem: Clinical Measurements: Goal: Ability to maintain clinical measurements within normal limits will improve Outcome: Progressing Goal: Will remain free from infection Outcome: Progressing Goal: Diagnostic test results will improve Outcome: Progressing Goal: Respiratory complications will improve Outcome: Progressing Goal: Cardiovascular complication will be avoided Outcome: Progressing   Problem: Activity: Goal: Risk for activity intolerance will decrease Outcome: Progressing   Problem: Nutrition: Goal: Adequate nutrition will be  maintained Outcome: Progressing   Problem: Coping: Goal: Level of anxiety will decrease Outcome: Progressing   Problem: Elimination: Goal: Will not experience complications related to bowel motility Outcome: Progressing Goal: Will not experience complications related to urinary retention Outcome: Progressing   Problem: Pain Managment: Goal: General experience of comfort will improve Outcome: Progressing   Problem: Safety: Goal: Ability to remain free from injury will improve Outcome: Progressing   Problem: Skin Integrity: Goal: Risk for impaired skin integrity will decrease Outcome: Progressing   Problem: Education: Goal: Knowledge of General Education information will improve Description: Including pain rating scale, medication(s)/side effects and non-pharmacologic comfort measures Outcome: Progressing   Problem: Health Behavior/Discharge Planning: Goal: Ability to manage health-related needs will improve Outcome: Progressing   Problem: Clinical Measurements: Goal: Ability to maintain clinical measurements within normal limits will improve Outcome: Progressing Goal: Will remain free from infection Outcome: Progressing Goal: Diagnostic test results will improve Outcome: Progressing Goal: Respiratory complications will improve Outcome: Progressing Goal: Cardiovascular complication will be avoided Outcome: Progressing   Problem: Activity: Goal: Risk for activity intolerance will decrease Outcome: Progressing   Problem: Nutrition: Goal: Adequate nutrition will be maintained Outcome: Progressing   Problem: Coping: Goal: Level of anxiety will decrease Outcome: Progressing   Problem: Elimination: Goal: Will not experience complications related to bowel motility Outcome: Progressing Goal: Will not experience complications related to urinary retention Outcome: Progressing   Problem: Pain Managment: Goal: General experience of comfort will improve Outcome:  Progressing   Problem: Safety: Goal: Ability to remain free from injury will improve Outcome: Progressing   Problem: Skin Integrity: Goal: Risk for impaired skin integrity will decrease Outcome: Progressing   Problem: Education: Goal: Knowledge of the prescribed therapeutic regimen will improve Outcome: Progressing Goal: Ability to verbalize activity precautions or restrictions will improve Outcome: Progressing Goal: Understanding of discharge needs will improve Outcome: Progressing   Problem: Activity: Goal: Ability to perform//tolerate increased activity and mobilize with assistive devices will improve Outcome: Progressing   Problem: Clinical Measurements: Goal: Postoperative complications will be avoided or minimized Outcome:  Progressing   Problem: Self-Care: Goal: Ability to meet self-care needs will improve Outcome: Progressing

## 2023-06-24 NOTE — Plan of Care (Signed)
  Problem: Coping: Goal: Ability to adjust to condition or change in health will improve Outcome: Progressing   Problem: Metabolic: Goal: Ability to maintain appropriate glucose levels will improve Outcome: Progressing   

## 2023-06-24 NOTE — Progress Notes (Signed)
Occupational Therapy Treatment Patient Details Name: [name] MRN: [id number] DOB: 02-Jul-1966 Today's Date: 06/24/2023   History of present illness Pt is a 57 year old male admitted for evaluation of being unable to get up and ambulate, and not eating for 2 days, admitted for left foot gangrene with osteomyelitis . 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   OT comments  Pt able to participate today with max cues for participation/task initiation. Able to use google translate for commands with 100% follow through with instructions and safety awareness, although decreased carryover of learned skills. Pt requires max verbal cueing for task initiation and maintaining attention. Pt able to improve with LB dressing today, donning/doffing L postop shoe with min A, difficulty with problem solving getting it on tight enough after repeated attempts. Pt able to ambulate around room with decreased safety awareness and repeated verbal/tactile cueing, demonstrations to keep RW close, Pt unable to complete task with RW in safe position, although had no LOB with ambulation. Pt does have difficulty with STS, not able to maintain balance without min A for support to find balance, difficulty problem solving/sequencing STS using B hands for support, tends to pull on unstable walker for STS. Pt would benefit greatly from continued skilled therapy to maximize participation, safety awareness, and independence, DC to post acute still appropriate.       If plan is discharge home, recommend the following:  A little help with walking and/or transfers;A little help with bathing/dressing/bathroom;Assistance with cooking/housework;Assist for transportation   Equipment Recommendations  Other (comment) (defer)    Recommendations for Other Services      Precautions /  Restrictions Precautions Precautions: Fall;Other (comment) Precaution Comments: History of TBI with left side weakness and cognitive impairments Required Braces or Orthoses: Other Brace Other Brace: L post op shoe Restrictions Weight Bearing Restrictions: Yes LLE Weight Bearing: Weight bearing as tolerated Other Position/Activity Restrictions: Upgraded to WBAT with darco shoe.       Mobility Bed Mobility Overal bed mobility: Modified Independent             General bed mobility comments: cueing for intiation    Transfers Overall transfer level: Needs assistance Equipment used: Rolling walker (2 wheels) Transfers: Sit to/from Stand, Bed to chair/wheelchair/BSC Sit to Stand: Min assist     Step pivot transfers: Min assist     General transfer comment: min A to power STS and maintain balance, min A for ambulation/ transfer for hand placement, RW placement, constant verbal/tactile cueing for safety.     Balance Overall balance assessment: Needs assistance Sitting-balance support: No upper extremity supported, Feet supported Sitting balance-Leahy Scale: Normal Sitting balance - Comments: EOM ADL completion   Standing balance support: Bilateral upper extremity supported, During functional activity Standing balance-Leahy Scale: Good Standing balance comment: Pt able to stand unsupported but due to decreased safety awareness Pt requires CGA/min A at times to maintain safe balance due to impulsiveness.                           ADL either performed or assessed with clinical judgement   ADL Overall ADL's : Needs assistance/impaired                 Upper Body Dressing : Minimal assistance;Sitting   Lower Body Dressing: Minimal assistance;Sitting/lateral leans   Toilet Transfer: Contact guard assist;Regular Toilet;Rolling  walker (2 wheels);Grab bars   Toileting- Clothing Manipulation and Hygiene: Minimal assistance;Sit to/from stand       Functional  mobility during ADLs: Contact guard assist;Rolling walker (2 wheels) General ADL Comments: Pt requires verbal cueing for safety with RW, hand placement, sequencing, and personal hygiene/toileting, min A for support throughtout. Pt instructed on LB dressing repeatedly to increase carryover of learned skills, min A.    Extremity/Trunk Assessment Upper Extremity Assessment Upper Extremity Assessment: Overall WFL for tasks assessed            Vision       Perception     Praxis      Cognition Arousal: Alert Behavior During Therapy: Flat affect, Impulsive Overall Cognitive Status: History of cognitive impairments - at baseline                                 General Comments: Pt has hx of cognitive impairment at baseline, TBI, impulsive, difficulty maintaining attention and following directions        Exercises      Shoulder Instructions       General Comments      Pertinent Vitals/ Pain       Pain Assessment Pain Assessment: Faces Faces Pain Scale: Hurts a little bit Pain Location: L Foot Pain Descriptors / Indicators: Discomfort Pain Intervention(s): Monitored during session  Home Living                                          Prior Functioning/Environment              Frequency  Min 1X/week        Progress Toward Goals  OT Goals(current goals can now be found in the care plan section)  Progress towards OT goals: Progressing toward goals  Acute Rehab OT Goals Patient Stated Goal: unable to participate in goal setting OT Goal Formulation: Patient unable to participate in goal setting Time For Goal Achievement: 06/29/23 Potential to Achieve Goals: Good ADL Goals Pt Will Perform Lower Body Dressing: with set-up;sit to/from stand Pt Will Transfer to Toilet: with supervision;ambulating;regular height toilet Pt Will Perform Toileting - Clothing Manipulation and hygiene: with supervision;sit to/from stand Additional ADL  Goal #1: Pt will be able to independently call for assistance when in need of toileting to increase continence and maintain personal/toileting hygiene.  Plan      Co-evaluation                 AM-PAC OT "6 Clicks" Daily Activity     Outcome Measure   Help from another person eating meals?: None Help from another person taking care of personal grooming?: A Little Help from another person toileting, which includes using toliet, bedpan, or urinal?: A Lot Help from another person bathing (including washing, rinsing, drying)?: A Lot Help from another person to put on and taking off regular upper body clothing?: A Little Help from another person to put on and taking off regular lower body clothing?: A Little 6 Click Score: 17    End of Session Equipment Utilized During Treatment: Gait belt;Rolling walker (2 wheels)  OT Visit Diagnosis: Unsteadiness on feet (R26.81);Other abnormalities of gait and mobility (R26.89);Muscle weakness (generalized) (M62.81);Other symptoms and signs involving cognitive function Pain - Right/Left: Left Pain - part of body: Ankle and joints  of foot   Activity Tolerance Patient tolerated treatment well   Patient Left in bed;with call bell/phone within reach;Other (comment) ([place] SW in room)   Nurse Communication Mobility status        Time: [phone] OT Time Calculation (min): 19 min  Charges: OT General Charges $OT Visit: 1 Visit OT Treatments $Self Care/Home Management : 8-22 mins  [place], OTR/L   [name] 06/24/2023, 4:20 PM

## 2023-06-25 LAB — GLUCOSE, CAPILLARY
Glucose-Capillary: 139 mg/dL — ABNORMAL HIGH (ref 70–99)
Glucose-Capillary: 115 mg/dL — ABNORMAL HIGH (ref 70–99)
Glucose-Capillary: 101 mg/dL — ABNORMAL HIGH (ref 70–99)
Glucose-Capillary: 138 mg/dL — ABNORMAL HIGH (ref 70–99)

## 2023-06-25 NOTE — Progress Notes (Signed)
Nutrition Follow-up  DOCUMENTATION CODES:   Not applicable  INTERVENTION:  Continue carb modified diet Ensure Enlive po BID, each supplement provides 350 kcal and 20 grams of protein. Juven BID to support wound healing MVI with minerals daily Request updated measured weight  NUTRITION DIAGNOSIS:   Increased nutrient needs related to acute illness (L foot gangrene with osteomyelitis; wound vac) as evidenced by estimated needs. 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 He was found to have L foot gangrene with osteomyelitis.  7/31: s/p L TMA with wound VAC 8/8: wound VAC discontinued  Pt remains stable for discharge, pending placement.   No nutrition related concerns at this time. Continues with good PO intake and consuming nutrition supplements.   Meal completions: 100% x 8 recorded meals (08/19-8/22)  Medications: Vitamin C 1000mg  daily, SSI 0-5 units TID, semglee 10 units daily, MVI, protonix  Labs: CBG's 115-136 x24 hours  Diet Order:   Diet Order             Diet Carb Modified Fluid consistency: Thin; Room service appropriate? No  Diet effective now                   EDUCATION NEEDS:   Not appropriate for education at this time  Skin:  Skin Assessment: Skin Integrity Issues: Skin Integrity Issues:: Incisions Incisions: Left foot  Last BM:  8/13 (type 4x1, type 5x1)  Height:   Ht Readings from Last 1 Encounters:  03/11/23 5\' 2"  (1.575 m)    Weight:   Wt Readings from Last  1 Encounters:  05/26/23 54.6 kg   BMI:  Body mass index is 22.02 kg/m.  Estimated Nutritional Needs:   Kcal:  1750-1950 kcals  Protein:  80-100 grams  Fluid:  >/= 1.7L  [name], RDN, LDN Clinical Nutrition

## 2023-06-25 NOTE — Progress Notes (Signed)
[facility] PROGRESS NOTE  [name] (DOB: Aug 04, 1966) [id number] PCP: Marcine Matar, MD  Brief Narrative: [name] is a 57 y.o. 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 He underwent left TMA with wound VAC by Dr. Lajoyce Corners on 06/02/2023. Therapy recommended home health but patient has no safe disposition. Per sister's report to [facility], "he was staying with some people because he couldn't stay with sister anymore due to violent behaviors. Sister was bringing food and they were letting him stay for free. And now those people are saying he can't come back and sister can't take him either". TOC filed APS report.  Now a placement issue.   Subjective: No overnight events  Objective: BP 104/89 (BP Location: Right Arm)   Pulse 64   Temp (!) 97.5 F (36.4 C) (Oral)   Resp 16   Wt 54.6 kg   SpO2 100%   BMI 22.02 kg/m    In bed, NAD   Assessment & Plan: Left foot cellulitis with gangrene/osteomyelitis -MRI with osteo of the 2nd toe and plantar muscles suggesting myositis He was seen by the vascular surgeon and there was no indication for vascular intervention. -S/p left TMA and wound VAC placement by Dr. Lajoyce Corners on 7/31. Fell OOB tearing drainage tube, so it was not replaced 8/8. Reevaluation 8/20 > wound looks good, will remove sutures in 1-2 weeks.  - Per ortho, surgical source control achieved.  Antibiotic discontinued. - Continue tylenol prn pain. - PT/OT initially recommended home health and DME but no safe disposition.  PT now recommending SNF. TOC following.   H/o TBI/cognitive impairment and intermittent aggressive behaviors Depression - Continue risperdal, prozac and hydroxyzine - Psychiatry evaluation appreciated: Patient does not currently have capacity   Unsafe living  situation: Sister says living in a filthy, unsafe living situation.  Patient with TBI/cognitive impairment.  Has no capacity to make medical decision. - TOC filed APS report. Discussion per CSW will occur on 8/20.   T2DM with hyperglycemia: HbA1c 5.8%.  - Continue glargine 10u daily and SSI. Remains at inpatient goal.     Anemia of chronic disease: Stable. - Monitor intermittently.   Fall in the hospital: No apparent injury or acute focal neurodeficit. - Fall precautions   Seizure disorder: - Continue keppra 500mg  BID    Hyponatremia: Very mild, asymptomatic.  - Monitor intermittently.   Hypertension: Normotensive - Continue lisinopril.   Hyperlipidemia - Continue statin   Joseph Art, DO [facility] [email or web address] 06/25/2023, 11:41 AM

## 2023-06-25 NOTE — Plan of Care (Signed)
  Problem: Skin Integrity: Goal: Risk for impaired skin integrity will decrease Outcome: Progressing   Problem: Tissue Perfusion: Goal: Adequacy of tissue perfusion will improve Outcome: Progressing   

## 2023-06-25 NOTE — TOC Progression Note (Signed)
Transition of Care [facility]) - Progression Note    Patient Details  Name: [name] MRN: [id number] Date of Birth: [birth year]  Transition of [facility]) CM/SW Contact  Lorri Frederick, LCSW Phone Number: 06/25/2023, 8:36 AM  Clinical Narrative:   [facility] [place]/APS.  They are not pursuing guardianship for pt.  They are pursuing placement at some sort of group home for TBI.  [place].      Expected Discharge Plan: Home w Home Health Services Barriers to Discharge: No Barriers Identified  Expected Discharge Plan and Services       Living arrangements for the past 2 months: Single Family Home                                       Social Determinants of Health (SDOH) Interventions SDOH Screenings   Depression (PHQ2-9): Low Risk  (03/11/2023)  Tobacco Use: Low Risk  (06/02/2023)    Readmission Risk Interventions     No data to display

## 2023-06-26 LAB — GLUCOSE, CAPILLARY
Glucose-Capillary: 117 mg/dL — ABNORMAL HIGH (ref 70–99)
Glucose-Capillary: 138 mg/dL — ABNORMAL HIGH (ref 70–99)
Glucose-Capillary: 160 mg/dL — ABNORMAL HIGH (ref 70–99)
Glucose-Capillary: 118 mg/dL — ABNORMAL HIGH (ref 70–99)

## 2023-06-26 NOTE — Progress Notes (Signed)
[facility] PROGRESS NOTE  Blakley Laverde Cardenas (DOB: [birth year]) [id number] PCP: Marcine Matar, MD  Brief Narrative: [name] is a 57 y.o. 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 He underwent left TMA with wound VAC by Dr. Lajoyce Corners on 06/02/2023. Therapy recommended home health but patient has no safe disposition. Per sister's report to [facility], "he was staying with some people because he couldn't stay with sister anymore due to violent behaviors. Sister was bringing food and they were letting him stay for free. And now those people are saying he can't come back and sister can't take him either". TOC filed APS report.  Now a placement issue.   Subjective: No overnight events  Objective: BP 107/65 (BP Location: Right Arm)   Pulse 73   Temp 98.4 F (36.9 C) (Oral)   Resp 17   Wt 54.6 kg   SpO2 97%   BMI 22.02 kg/m    In bed, NAD   Assessment & Plan: Left foot cellulitis with gangrene/osteomyelitis -MRI with osteo of the 2nd toe and plantar muscles suggesting myositis He was seen by the vascular surgeon and there was no indication for vascular intervention. -S/p left TMA and wound VAC placement by Dr. Lajoyce Corners on 7/31. Fell OOB tearing drainage tube, so it was not replaced 8/8. Reevaluation 8/20 > wound looks good, will remove sutures in 1-2 weeks.  - Per ortho, surgical source control achieved.  Antibiotic discontinued. - Continue tylenol prn pain. - PT/OT initially recommended home health and DME but no safe disposition.  PT now recommending SNF. TOC following-- may end up at group home   H/o TBI/cognitive impairment and intermittent aggressive behaviors Depression - Continue risperdal, prozac and hydroxyzine - Psychiatry evaluation appreciated: Patient does not currently have  capacity   Unsafe living situation: Sister says living in a filthy, unsafe living situation.  Patient with TBI/cognitive impairment.  Has no capacity to make medical decision.    T2DM with hyperglycemia: HbA1c 5.8%.  - Continue glargine 10u daily and SSI. Remains at inpatient goal.     Anemia of chronic disease: Stable. - Monitor intermittently.   Fall in the hospital: No apparent injury or acute focal neurodeficit. - Fall precautions   Seizure disorder: - Continue keppra 500mg  BID    Hyponatremia: Very mild, asymptomatic.  - Monitor intermittently.   Hypertension: Normotensive - Continue lisinopril.   Hyperlipidemia - Continue statin   Joseph Art, DO [facility] [email or web address] 06/26/2023, 12:18 PM

## 2023-06-27 LAB — GLUCOSE, CAPILLARY
Glucose-Capillary: 146 mg/dL — ABNORMAL HIGH (ref 70–99)
Glucose-Capillary: 110 mg/dL — ABNORMAL HIGH (ref 70–99)
Glucose-Capillary: 104 mg/dL — ABNORMAL HIGH (ref 70–99)
Glucose-Capillary: 150 mg/dL — ABNORMAL HIGH (ref 70–99)

## 2023-06-27 NOTE — Progress Notes (Signed)
Physical Therapy Treatment Patient Details Name: [name] MRN: [id number] DOB: 1966/05/03 Today's Date: 06/27/2023   History of Present Illness Pt is a 57 year old male admitted for evaluation of being unable to get up and ambulate, and not eating for 2 days, admitted for left foot gangrene with osteomyelitis . 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    PT Comments  Pt tolerated treatment well today. Pt today was able to progress ambulation in the hallway with +2 HHA CGA. DC recs updated to follow physicians recs. PT will continue to follow.    If plan is discharge home, recommend the following: A little help with walking and/or transfers;A little help with bathing/dressing/bathroom;Assistance with cooking/housework;Direct supervision/assist for medications management;Assist for transportation;Help with stairs or ramp for entrance   Can travel by private vehicle     No  Equipment Recommendations  Other (comment) (per accepting facility)    Recommendations for Other Services       Precautions / Restrictions Precautions Precautions: Fall;Other (comment) Precaution Comments: History of TBI with left side weakness and cognitive impairments Required Braces or Orthoses: Other Brace Other Brace: L post op shoe Restrictions Weight Bearing Restrictions: Yes LLE Weight Bearing: Weight bearing as tolerated     Mobility  Bed Mobility Overal bed mobility: Modified Independent                  Transfers Overall transfer level: Needs assistance Equipment used: 2 person hand held assist Transfers: Sit to/from Stand Sit to Stand: Min assist           General transfer comment: Min A to steady.    Ambulation/Gait Ambulation/Gait assistance: Contact guard assist, +2 safety/equipment Gait Distance (Feet): 200  Feet Assistive device: 2 person hand held assist Gait Pattern/deviations: Shuffle, Staggering left, Staggering right, Drifts right/left, Decreased stride length, Step-through pattern Gait velocity: decreased     General Gait Details: pt able to progess ambulation in hallway. Pt noted with shorter steps once fatigued.   Stairs             Wheelchair Mobility     Tilt Bed    Modified Rankin (Stroke Patients Only)       Balance Overall balance assessment: Needs assistance Sitting-balance support: No upper extremity supported, Feet supported Sitting balance-Leahy Scale: Normal     Standing balance support: Bilateral upper extremity supported, During functional activity Standing balance-Leahy Scale: Good Standing balance comment: Pt able to stand unsupported but due to decreased safety awareness Pt requires CGA/min A at times to maintain safe balance due to impulsiveness.                            Cognition Arousal: Alert Behavior During Therapy: Flat affect, Impulsive Overall Cognitive Status: History of cognitive impairments - at baseline                                 General Comments: Pt has hx of cognitive impairment at baseline, TBI, impulsive, difficulty maintaining attention and following directions        Exercises      General Comments General comments (skin integrity, edema, etc.): VSS      Pertinent Vitals/Pain Pain Assessment Pain Assessment: No/denies pain    Home Living  Prior Function            PT Goals (current goals can now be found in the care plan section) Progress towards PT goals: Progressing toward goals    Frequency    Min 1X/week      PT Plan Current plan remains appropriate    Co-evaluation              AM-PAC PT "6 Clicks" Mobility   Outcome Measure  Help needed turning from your back to your side while in a flat bed without using bedrails?:  None Help needed moving from lying on your back to sitting on the side of a flat bed without using bedrails?: None Help needed moving to and from a bed to a chair (including a wheelchair)?: A Little Help needed standing up from a chair using your arms (e.g., wheelchair or bedside chair)?: A Little Help needed to walk in hospital room?: A Lot Help needed climbing 3-5 steps with a railing? : Total 6 Click Score: 17    End of Session Equipment Utilized During Treatment: Gait belt Activity Tolerance: Patient tolerated treatment well Patient left: in bed;with call bell/phone within reach;with bed alarm set Nurse Communication: Mobility status PT Visit Diagnosis: Other abnormalities of gait and mobility (R26.89)     Time: 5366-4403 PT Time Calculation (min) (ACUTE ONLY): 14 min  Charges:    $Gait Training: 8-22 mins PT General Charges $$ ACUTE PT VISIT: 1 Visit                     Shela Nevin, PT, DPT Acute Rehab Services [phone]    [name] 06/27/2023, 4:19 PM

## 2023-06-27 NOTE — Progress Notes (Signed)
[facility] PROGRESS NOTE  Hrishikesh Somma Cardenas (DOB: [birth year]) [id number] PCP: Marcine Matar, MD  Brief Narrative: [name] is a 57 y.o. 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 He underwent left TMA with wound VAC by Dr. Lajoyce Corners on 06/02/2023. Therapy recommended home health but patient has no safe disposition. Per sister's report to [facility], "he was staying with some people because he couldn't stay with sister anymore due to violent behaviors. Sister was bringing food and they were letting him stay for free. And now those people are saying he can't come back and sister can't take him either". TOC filed APS report.  Now a placement issue.   Subjective: No overnight events  Objective: BP 100/60 (BP Location: Right Arm)   Pulse 77   Temp 98.6 F (37 C)   Resp 18   Wt 54.6 kg   SpO2 98%   BMI 22.02 kg/m    In bed, NAD   Assessment & Plan: Left foot cellulitis with gangrene/osteomyelitis -MRI with osteo of the 2nd toe and plantar muscles suggesting myositis He was seen by the vascular surgeon and there was no indication for vascular intervention. -S/p left TMA and wound VAC placement by Dr. Lajoyce Corners on 7/31. Fell OOB tearing drainage tube, so it was not replaced 8/8. Reevaluation 8/20 > wound looks good, will remove sutures in 1-2 weeks.  - Per ortho, surgical source control achieved.  Antibiotic discontinued. - Continue tylenol prn pain. - PT/OT initially recommended home health and DME but no safe disposition.  PT now recommending SNF. TOC following-- may end up at group home   H/o TBI/cognitive impairment and intermittent aggressive behaviors Depression - Continue risperdal, prozac and hydroxyzine - Psychiatry evaluation appreciated: Patient does not currently have capacity    Unsafe living situation: Sister says living in a filthy, unsafe living situation.  Patient with TBI/cognitive impairment.  Has no capacity to make medical decision.    T2DM with hyperglycemia: HbA1c 5.8%.  - Continue glargine 10u daily and SSI. Remains at inpatient goal.     Anemia of chronic disease: Stable. - Monitor intermittently.   Fall in the hospital: No apparent injury or acute focal neurodeficit. - Fall precautions   Seizure disorder: - Continue keppra 500mg  BID    Hyponatremia: Very mild, asymptomatic.  - Monitor intermittently.   Hypertension: Normotensive - Continue lisinopril.   Hyperlipidemia - Continue statin   Joseph Art, DO [facility] [email or web address] 06/27/2023, 11:04 AM

## 2023-06-28 LAB — GLUCOSE, CAPILLARY
Glucose-Capillary: 160 mg/dL — ABNORMAL HIGH (ref 70–99)
Glucose-Capillary: 170 mg/dL — ABNORMAL HIGH (ref 70–99)
Glucose-Capillary: 111 mg/dL — ABNORMAL HIGH (ref 70–99)
Glucose-Capillary: 103 mg/dL — ABNORMAL HIGH (ref 70–99)
Glucose-Capillary: 113 mg/dL — ABNORMAL HIGH (ref 70–99)
Glucose-Capillary: 67 mg/dL — ABNORMAL LOW (ref 70–99)
Glucose-Capillary: 74 mg/dL (ref 70–99)
Glucose-Capillary: 105 mg/dL — ABNORMAL HIGH (ref 70–99)
Glucose-Capillary: 102 mg/dL — ABNORMAL HIGH (ref 70–99)
Glucose-Capillary: 109 mg/dL — ABNORMAL HIGH (ref 70–99)
Glucose-Capillary: 124 mg/dL — ABNORMAL HIGH (ref 70–99)

## 2023-06-28 NOTE — Progress Notes (Signed)
[facility] PROGRESS NOTE  [name] (DOB: 1966/01/23) VQQ:[id number] PCP: Jamie Matar, MD  Brief Narrative: [name] is a 57 y.o. 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 He underwent left TMA with wound VAC by Dr. Lajoyce Berg on 06/02/2023. Therapy recommended home health but patient has no safe disposition. Per sister's report to [facility], "he was staying with some people because he couldn't stay with sister anymore due to violent behaviors. Sister was bringing food and they were letting him stay for free. And now those people are saying he can't come back and sister can't take him either". TOC filed APS report.  Now a placement issue.   Subjective: No overnight events  Objective: BP 108/62 (BP Location: Right Arm)   Pulse 73   Temp 97.9 F (36.6 C)   Resp 16   Wt 54.6 kg   SpO2 99%   BMI 22.02 kg/m    In bed, NAD   Assessment & Plan: Left foot cellulitis with gangrene/osteomyelitis -MRI with osteo of the 2nd toe and plantar muscles suggesting myositis He was seen by the vascular surgeon and there was no indication for vascular intervention. -S/p left TMA and wound VAC placement by Dr. Lajoyce Berg on 7/31. Fell OOB tearing drainage tube, so it was not replaced 8/8. Reevaluation 8/20 > wound looks good, will remove sutures in 1-2 weeks.  - Per ortho, surgical source control achieved.  Antibiotic discontinued. - Continue tylenol prn pain. - PT/OT initially recommended home health and DME but no safe disposition.  PT now recommending SNF. TOC following-- may end up at group home   H/o TBI/cognitive impairment and intermittent aggressive behaviors Depression - Continue risperdal, prozac and hydroxyzine - Psychiatry evaluation appreciated: Patient does not currently have capacity    Unsafe living situation: Sister says living in a filthy, unsafe living situation.  Patient with TBI/cognitive impairment.  Has no capacity to make medical decision.    T2DM with hyperglycemia: HbA1c 5.8%.  - Continue glargine 10u daily and SSI. Remains at inpatient goal.     Anemia of chronic disease: Stable. - Monitor intermittently.   Fall in the hospital: No apparent injury or acute focal neurodeficit. - Fall precautions   Seizure disorder: - Continue keppra 500mg  BID    Hyponatremia: Very mild, asymptomatic.  - Monitor intermittently.   Hypertension: Normotensive - Continue lisinopril.   Hyperlipidemia - Continue statin   Jamie Art, DO [facility] [email or web address] 06/28/2023, 11:41 AM

## 2023-06-28 NOTE — Progress Notes (Signed)
Assisted RN with interpretation by Orlan Leavens Spanish Medical Interpreter.

## 2023-06-29 LAB — GLUCOSE, CAPILLARY
Glucose-Capillary: 133 mg/dL — ABNORMAL HIGH (ref 70–99)
Glucose-Capillary: 82 mg/dL (ref 70–99)
Glucose-Capillary: 132 mg/dL — ABNORMAL HIGH (ref 70–99)

## 2023-06-29 NOTE — Progress Notes (Signed)
[facility] PROGRESS NOTE  Danley Alarcon Cardenas (DOB: Oct 14, 1966) [id number] PCP: Marcine Matar, MD  Brief Narrative: [name] is a 57 y.o. 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 He underwent left TMA with wound VAC by Dr. Lajoyce Corners on 06/02/2023. Therapy recommended home health but patient has no safe disposition. Per sister's report to [facility], "he was staying with some people because he couldn't stay with sister anymore due to violent behaviors. Sister was bringing food and they were letting him stay for free. And now those people are saying he can't come back and sister can't take him either". TOC filed APS report.  Now a placement issue.   Subjective: No overnight events  Objective: BP 100/66 (BP Location: Right Arm)   Pulse 82   Temp 98.3 F (36.8 C)   Resp 17   Wt 54.6 kg   SpO2 99%   BMI 22.02 kg/m    In bed, NAD   Assessment & Plan: Left foot cellulitis with gangrene/osteomyelitis -MRI with osteo of the 2nd toe and plantar muscles suggesting myositis He was seen by the vascular surgeon and there was no indication for vascular intervention. -S/p left TMA and wound VAC placement by Dr. Lajoyce Corners on 7/31. Fell OOB tearing drainage tube, so it was not replaced 8/8. Reevaluation 8/20 > wound looks good, will remove sutures in 1-2 weeks.  - Per ortho, surgical source control achieved.  Antibiotic discontinued. - Continue tylenol prn pain. - PT/OT initially recommended home health and DME but no safe disposition.  PT now recommending SNF. TOC following-- may end up at group home   H/o TBI/cognitive impairment and intermittent aggressive behaviors Depression - Continue risperdal, prozac and hydroxyzine - Psychiatry evaluation appreciated: Patient does not currently have capacity    Unsafe living situation: Sister says living in a filthy, unsafe living situation.  Patient with TBI/cognitive impairment.  Has no capacity to make medical decision.    T2DM with hyperglycemia: HbA1c 5.8%.  - Continue glargine 10u daily and SSI. Remains at inpatient goal.     Anemia of chronic disease: Stable. - Monitor intermittently.   Fall in the hospital: No apparent injury or acute focal neurodeficit. - Fall precautions   Seizure disorder: - Continue keppra 500mg  BID    Hyponatremia: Very mild, asymptomatic.  - Monitor intermittently.   Hypertension: Normotensive - Continue lisinopril.   Hyperlipidemia - Continue statin   Joseph Art, DO [facility] [email or web address] 06/29/2023, 9:52 AM

## 2023-06-29 NOTE — Progress Notes (Signed)
Physical Therapy Treatment Patient Details Name: Ilhan Escue MRN: [id number] DOB: 1966-03-08 Today's Date: 06/29/2023   History of Present Illness Pt is a 57 year old male admitted for evaluation of being unable to get up and ambulate, and not eating for 2 days, admitted for left foot gangrene with osteomyelitis . 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    PT Comments  Continuing work on functional mobility and activity tolerance;  seen in conjunction with OT for problem-solving/pathfinding during gait activities/hallway ambulation; walks with wide step width and very short, fast steps resembling festination; will work on balance, fullweight acceptance onto stance LE (L and R) for smoother steps  In person interpreter used during session: Orlan Leavens.     If plan is discharge home, recommend the following: A little help with walking and/or transfers;A little help with bathing/dressing/bathroom;Assistance with cooking/housework;Direct supervision/assist for medications management;Assist for transportation;Help with stairs or ramp for entrance   Can travel by private vehicle      (Perhaps soon)  Equipment Recommendations  Other (comment) (Will continue to discern equipment recommendations based on pt's progress with functional mobility and ADLs. )    Recommendations for Other Services       Precautions / Restrictions Precautions Precautions: Fall;Other (comment) Precaution Comments: History of TBI with left side weakness and cognitive impairments Required Braces or Orthoses: Other Brace Other Brace: L post op shoe Restrictions Weight Bearing Restrictions: Yes LLE Weight Bearing: Weight bearing as tolerated     Mobility  Bed Mobility               General bed mobility comments: Pt's bed alarm going off as he was  attempting to go to the bathroom. PT and NT in room assisting pt into bathroom upon therapy arrival    Transfers Overall transfer level: Needs assistance Equipment used: 1 person hand held assist, 2 person hand held assist Transfers: Sit to/from Stand, Bed to chair/wheelchair/BSC Sit to Stand: Min assist Stand pivot transfers: Min assist Step pivot transfers: Min assist Squat pivot transfers: Min assist     General transfer comment: Min A to steady.    Ambulation/Gait Ambulation/Gait assistance: Contact guard assist, Min assist Gait Distance (Feet): 300 Feet Assistive device: 2 person hand held assist, 1 person hand held assist Gait Pattern/deviations: Shuffle, Staggering left, Staggering right, Drifts right/left, Decreased stride length, Step-through pattern       General Gait Details: pt able to progess ambulation in hallway; Pt walks with very wide BOS, and short steps, resmbling festination; Multimodal cues to take slower step and allow full weight shift onto stance LE before stepping advancing LE; manual cues to facilitate lateral weight shifting; used obstacle to step over (canes laid on teh floor) to make full weight shift onto stance LE more necessary, with good result   Stairs             Wheelchair Mobility     Tilt Bed    Modified Rankin (Stroke Patients Only)       Balance             Standing balance-Leahy Scale: Poor (approaching Fair) Standing balance comment: Pt prefers to lean into sink/counter top when completing grooming task or holding onto external support when walking in hallway with therapist.  Cognition Arousal: Alert Behavior During Therapy: Impulsive Overall Cognitive Status: History of cognitive impairments - at baseline                                 General Comments: Pt has hx of cognitive impairment at baseline, TBI, impulsive, difficulty maintaining attention and following  directions        Exercises      General Comments        Pertinent Vitals/Pain Pain Assessment Pain Assessment: No/denies pain Pain Intervention(s): Monitored during session    Home Living                          Prior Function            PT Goals (current goals can now be found in the care plan section) Acute Rehab PT Goals Patient Stated Goal: Able to voice need to get to teh bathroom PT Goal Formulation: With patient Time For Goal Achievement: 06/24/23 Potential to Achieve Goals: Fair Progress towards PT goals: Progressing toward goals    Frequency    Min 1X/week      PT Plan Current plan remains appropriate    Co-evaluation   Reason for Co-Treatment: Necessary to address cognition/behavior during functional activity;To address functional/ADL transfers   OT goals addressed during session: ADL's and self-care      AM-PAC PT "6 Clicks" Mobility   Outcome Measure  Help needed turning from your back to your side while in a flat bed without using bedrails?: None Help needed moving from lying on your back to sitting on the side of a flat bed without using bedrails?: None Help needed moving to and from a bed to a chair (including a wheelchair)?: A Little Help needed standing up from a chair using your arms (e.g., wheelchair or bedside chair)?: A Little Help needed to walk in hospital room?: A Lot Help needed climbing 3-5 steps with a railing? : Total 6 Click Score: 17    End of Session Equipment Utilized During Treatment: Gait belt Activity Tolerance: Patient tolerated treatment well Patient left: in chair;with call bell/phone within reach;with chair alarm set Nurse Communication: Mobility status PT Visit Diagnosis: Other abnormalities of gait and mobility (R26.89)     Time: 0981-1914 PT Time Calculation (min) (ACUTE ONLY): 38 min  Charges:    $Gait Training: 23-37 mins PT General Charges $$ ACUTE PT VISIT: 1 Visit                      Van Clines, PT  Acute Rehabilitation Services Office [phone] Secure Chat welcomed    Levi Aland 06/29/2023, 5:58 PM

## 2023-06-29 NOTE — Progress Notes (Signed)
Occupational Therapy Treatment Patient Details Name: [name] MRN: [id number] DOB: 1966/02/04 Today's Date: 06/29/2023   History of present illness Pt is a 57 year old male admitted for evaluation of being unable to get up and ambulate, and not eating for 2 days, admitted for left foot gangrene with osteomyelitis . 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   OT comments  In person interpreter used during session: Orlan Leavens. Session focused on safety and direction following while completing BADL tasks. Pt continues to demonstrate decreased standing balance while attempting to hold onto external support for balance. RW was not used during session as it causes pt to be at a higher fall risk secondary to poor carry over of education. Min A provided occasionally during session when walking in room while providing SBA with VC for safety and sequencing through tasks.       If plan is discharge home, recommend the following:  A little help with walking and/or transfers;A little help with bathing/dressing/bathroom;Assistance with cooking/housework;Assist for transportation   Equipment Recommendations  Other (comment) (defer to next venue of care)       Precautions / Restrictions Precautions Precautions: Fall;Other (comment) Precaution Comments: History of TBI with left side weakness and cognitive impairments Required Braces or Orthoses: Other Brace Other Brace: L post op shoe Restrictions Weight Bearing Restrictions: Yes LLE Weight Bearing: Weight bearing as tolerated       Mobility Bed Mobility Overal bed mobility: Modified Independent    General bed mobility comments: Pt's bed alarm going off as he was attempting to go to the bathroom. PT and NT in room assisting pt into bathroom upon therapy arrival    Transfers Overall  transfer level: Needs assistance Equipment used: 1 person hand held assist, 2 person hand held assist Transfers: Sit to/from Stand, Bed to chair/wheelchair/BSC Sit to Stand: Min assist     Step pivot transfers: Min assist           Balance Overall balance assessment: Needs assistance Sitting-balance support: No upper extremity supported, Feet supported Sitting balance-Leahy Scale: Normal Sitting balance - Comments: ADL completion   Standing balance support: Bilateral upper extremity supported, During functional activity Standing balance-Leahy Scale: Poor Standing balance comment: Pt prefers to lean into sink/counter top when completing grooming task or holding onto external support when walking in hallway with therapist.        ADL either performed or assessed with clinical judgement   ADL Overall ADL's : Needs assistance/impaired     Grooming: Supervision/safety;Standing;Oral care;Wash/dry hands;Cueing for safety    Toilet Transfer: Minimal assistance;Ambulation;Grab bars;Regular Toilet   Toileting- Clothing Manipulation and Hygiene: Minimal assistance;Sit to/from stand                Cognition Arousal: Alert Behavior During Therapy: Impulsive Overall Cognitive Status: History of cognitive impairments - at baseline        General Comments: Pt has hx of cognitive impairment at baseline, TBI, impulsive, difficulty maintaining attention and following directions                   Pertinent Vitals/ Pain       Pain Assessment Pain Assessment: No/denies pain         Frequency  Min 1X/week        Progress Toward Goals  OT Goals(current goals can now be found in the care plan section)  Progress  towards OT goals: Progressing toward goals  Acute Rehab OT Goals OT Goal Formulation: Patient unable to participate in goal setting Time For Goal Achievement: 07/12/23 Potential to Achieve Goals: Good ADL Goals Pt Will Perform Lower Body Dressing: with  set-up;sit to/from stand Pt Will Transfer to Toilet: with supervision;ambulating;regular height toilet;grab bars Pt Will Perform Toileting - Clothing Manipulation and hygiene: with supervision;sit to/from stand Additional ADL Goal #1: Pt will independently call/request assistance when necessary for toileting to increase bladder/bowl continence and increase ability to manage personal/toilet hygiene.  Plan      Co-evaluation    PT/OT/SLP Co-Evaluation/Treatment: Yes Reason for Co-Treatment: Necessary to address cognition/behavior during functional activity;To address functional/ADL transfers   OT goals addressed during session: ADL's and self-care      AM-PAC OT "6 Clicks" Daily Activity     Outcome Measure   Help from another person eating meals?: None Help from another person taking care of personal grooming?: A Little Help from another person toileting, which includes using toliet, bedpan, or urinal?: A Lot Help from another person bathing (including washing, rinsing, drying)?: A Lot Help from another person to put on and taking off regular upper body clothing?: A Little Help from another person to put on and taking off regular lower body clothing?: A Little 6 Click Score: 17    End of Session Equipment Utilized During Treatment: Gait belt  OT Visit Diagnosis: Unsteadiness on feet (R26.81);Other abnormalities of gait and mobility (R26.89);Muscle weakness (generalized) (M62.81);Other symptoms and signs involving cognitive function;Pain Pain - Right/Left: Left Pain - part of body: Ankle and joints of foot   Activity Tolerance Patient tolerated treatment well   Patient Left in chair;with call bell/phone within reach;with chair alarm set           Time: 1610-9604 OT Time Calculation (min): 38 min  Charges: OT General Charges $OT Visit: 1 Visit OT Treatments $Self Care/Home Management : 8-22 mins  Limmie Patricia, OTR/L,CBIS  Supplemental OT - [facility] and [facility] Secure Chat  Preferred    Marquasia Schmieder, Charisse March 06/29/2023, 1:31 PM

## 2023-06-29 NOTE — TOC Progression Note (Signed)
Transition of Care [facility]) - Progression Note    Patient Details  Name: [name] MRN: [id number] Date of Birth: [birth year]  Transition of Care [facility]) CM/SW Contact  Lorri Frederick, LCSW Phone Number: 06/29/2023, 8:19 AM  Clinical Narrative:   Message from Angela/APS.  "We filed a petition for ex parte emergency services.  Court is tomorrow.  I am confident the court will grant the order which will allow [place] to sign on his behalf."      Expected Discharge Plan: Home w Home Health Services Barriers to Discharge: No Barriers Identified  Expected Discharge Plan and Services       Living arrangements for the past 2 months: Single Family Home                                       Social Determinants of Health (SDOH) Interventions SDOH Screenings   Depression (PHQ2-9): Low Risk  (03/11/2023)  Tobacco Use: Low Risk  (06/02/2023)    Readmission Risk Interventions     No data to display

## 2023-06-29 NOTE — Progress Notes (Signed)
I assisted Holly from Physical therapy ,Occupational therapist with interpretation by Orlan Leavens Spanish Interpreter.

## 2023-06-30 LAB — GLUCOSE, CAPILLARY
Glucose-Capillary: 189 mg/dL — ABNORMAL HIGH (ref 70–99)
Glucose-Capillary: 101 mg/dL — ABNORMAL HIGH (ref 70–99)
Glucose-Capillary: 169 mg/dL — ABNORMAL HIGH (ref 70–99)
Glucose-Capillary: 116 mg/dL — ABNORMAL HIGH (ref 70–99)

## 2023-06-30 LAB — BASIC METABOLIC PANEL
Anion gap: 8 (ref 5–15)
BUN: 28 mg/dL — ABNORMAL HIGH (ref 6–20)
CO2: 23 mmol/L (ref 22–32)
Calcium: 8.3 mg/dL — ABNORMAL LOW (ref 8.9–10.3)
Chloride: 105 mmol/L (ref 98–111)
Creatinine, Ser: 0.94 mg/dL (ref 0.61–1.24)
GFR, Estimated: >60 mL/min (ref >60)
Glucose, Bld: 114 mg/dL — ABNORMAL HIGH (ref 70–99)
Potassium: 3.7 mmol/L (ref 3.5–5.1)
Sodium: 136 mmol/L (ref 135–145)

## 2023-06-30 LAB — CBC
HCT: 28.8 % — ABNORMAL LOW (ref 39.0–52.0)
Hemoglobin: 9.5 g/dL — ABNORMAL LOW (ref 13.0–17.0)
MCH: 29.2 pg (ref 26.0–34.0)
MCHC: 33 g/dL (ref 30.0–36.0)
MCV: 88.6 fL (ref 80.0–100.0)
Platelets: 227 10*3/uL (ref 150–400)
RBC: 3.25 MIL/uL — ABNORMAL LOW (ref 4.22–5.81)
RDW: 13 % (ref 11.5–15.5)
WBC: 4.4 10*3/uL (ref 4.0–10.5)
nRBC: 0 % (ref 0.0–0.2)

## 2023-06-30 NOTE — Progress Notes (Signed)
[facility] PROGRESS NOTE  [name] (DOB: 1966/01/16) [id number] PCP: Jamie Matar, MD  Brief Narrative: [name] is a 57 y.o. 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 He underwent left TMA with wound VAC by Dr. Lajoyce Corners on 06/02/2023. Therapy recommended home health but patient has no safe disposition. Per sister's report to [facility], "he was staying with some people because he couldn't stay with sister anymore due to violent behaviors. Sister was bringing food and they were letting him stay for free. And now those people are saying he can't come back and sister can't take him either". TOC filed APS report.  Now a placement issue.   Subjective: Sitting up in his bed eating his breakfast.  Denies any complaints.  Objective: BP 101/61 (BP Location: Right Arm)   Pulse 73   Temp 98.2 F (36.8 C)   Resp 18   Wt 54.6 kg   SpO2 100%   BMI 22.02 kg/m    General appearance: Awake alert.  In no distress Resp: Clear to auscultation bilaterally.  Normal effort Cardio: S1-S2 is normal regular.  No S3-S4.  No rubs murmurs or bruit GI: Abdomen is soft.  Nontender nondistended.  Bowel sounds are present normal.  No masses organomegaly    Assessment & Plan:  Left foot cellulitis with gangrene/osteomyelitis -MRI with osteo of the 2nd toe and plantar muscles suggesting myositis He was seen by the vascular surgeon and there was no indication for vascular intervention. -S/p left TMA and wound VAC placement by Dr. Lajoyce Corners on 7/31. Fell OOB tearing drainage tube, so it was not replaced 8/8. Reevaluation 8/20 > wound looks good, will remove sutures in 1-2 weeks.  - Per ortho, surgical source control achieved.  Antibiotic discontinued. - Continue tylenol prn pain. - PT/OT initially recommended  home health and DME but no safe disposition.  PT now recommending SNF. TOC following-- may end up at group home   H/o TBI/cognitive impairment and intermittent aggressive behaviors/Depression - Continue risperdal, prozac and hydroxyzine - Psychiatry evaluation appreciated: Patient does not currently have capacity   Unsafe living situation According to sister patient has been living in a filthy, unsafe living situation.  Patient with TBI/cognitive impairment.  Has no capacity to make medical decision.   T2DM with hyperglycemia HbA1c 5.8%.  - Continue glargine 10u daily and SSI. Remains at inpatient goal.     Anemia of chronic disease : Stable. - Monitor intermittently.   Fall in the hospital No apparent injury or acute focal neurodeficit. - Fall precautions   Seizure disorder: - Continue keppra 500mg  BID    Hyponatremia Very mild, asymptomatic.  - Monitor intermittently.   Hypertension Normotensive - Continue lisinopril.   Hyperlipidemia - Continue statin  DVT prophylaxis: Lovenox CODE STATUS: Full code Family communication: No family at bedside Disposition: Pending   Osvaldo Shipper,  [facility] [email or web address] 06/30/2023, 9:01 AM

## 2023-06-30 NOTE — Plan of Care (Signed)
  Problem: Metabolic: Goal: Ability to maintain appropriate glucose levels will improve Outcome: Progressing   Problem: Skin Integrity: Goal: Risk for impaired skin integrity will decrease Outcome: Progressing   Problem: Tissue Perfusion: Goal: Adequacy of tissue perfusion will improve Outcome: Progressing   Problem: Education: Goal: Knowledge of General Education information will improve Description: Including pain rating scale, medication(s)/side effects and non-pharmacologic comfort measures Outcome: Progressing   Problem: Activity: Goal: Risk for activity intolerance will decrease Outcome: Progressing   Problem: Nutrition: Goal: Adequate nutrition will be maintained Outcome: Progressing   Problem: Coping: Goal: Level of anxiety will decrease Outcome: Progressing   Problem: Pain Managment: Goal: General experience of comfort will improve Outcome: Progressing   Problem: Safety: Goal: Ability to remain free from injury will improve Outcome: Progressing

## 2023-06-30 NOTE — Progress Notes (Signed)
Physical Therapy Treatment Patient Details Name: Quayshaun Wizner MRN: [id number] DOB: 1966/02/01 Today's Date: 06/30/2023   History of Present Illness Pt is a 57 year old male admitted for evaluation of being unable to get up and ambulate, and not eating for 2 days, admitted for left foot gangrene with osteomyelitis . 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    PT Comments  Continuing work on functional mobility and activity tolerance; session focused on progressive amb; continues to walk with wide, short steps; Festinating; worked on slowing and lengthening steps using various techniques -- manual cues, mathcing steps to slow counting, pauses in stance, high stepping -- with varying degrees of success; but still overall slight, but notable, difference in step length and width compared to yesterday's session;   Orlan Leavens, Medical Spanish Interpreter, present and facilitated clear communication;   Kayla, Network engineer, joined [place] towards the end of session to assist and get a feel for how Gail walks; Appreciate Mobility Team's efforts to keep pt up and active;   Lots to consider re: possible assistive device; a cane or RW will give Hennessy a wider base of support, and he naturally reaches out for bil UE support (and did quite well on the steps using both hand rails); Still, he normally does not use an assistive device, and it is possible that using an assistive device would get in the way more than being helpful - also likely he would forget to use it because it's not a part of his normal wlaking habits; Will continue to discern equipment recommendations based on pt's progress with functional mobility and ADLs.    If plan is discharge home, recommend the following: A little help with walking and/or transfers;A little help with  bathing/dressing/bathroom;Assistance with cooking/housework;Direct supervision/assist for medications management;Assist for transportation;Help with stairs or ramp for entrance   Can travel by private vehicle      (soon)  Equipment Recommendations  Other (comment) (Will continue to discern)    Recommendations for Other Services       Precautions / Restrictions Precautions Precautions: Fall;Other (comment) Precaution Comments: History of TBI with left side weakness and cognitive impairments Required Braces or Orthoses: Other Brace Other Brace: L post op shoe Restrictions LLE Weight Bearing: Weight bearing as tolerated Other Position/Activity Restrictions: Upgraded to WBAT with postop shoe.     Mobility  Bed Mobility Overal bed mobility: Needs Assistance Bed Mobility: Supine to Sit     Supine to sit: Supervision     General bed mobility comments: Noting pt initially getting up to EOB without assistance, but depending on momentum and inefficient movement getting up; pt laid back down to put on postop shoe, and after that he required multimodal cues for getting back up to EOB    Transfers Overall transfer level: Needs assistance Equipment used: 1 person hand held assist Transfers: Sit to/from Stand Sit to Stand: Min assist           General transfer comment: Pt immediately reaching out for UE assist    Ambulation/Gait Ambulation/Gait assistance: Contact guard assist Gait Distance (Feet): 240 Feet Assistive device: 1 person hand held assist (occasional 2 person handheld assist) Gait Pattern/deviations: Shuffle, Festinating, Staggering left, Staggering right, Wide base of support       General Gait Details: continues to walk with wide, short steps; Festinating; worked on slowing and lengthening steps using various techniques --  manual cues, mathcing steps to slow counting, pauses in stance, high stepping -- with varying degrees of success; but still overall slight,  but notable, difference in step length and width compared to yesterday's session   Stairs Stairs: Yes Stairs assistance: Supervision Stair Management: Two rails, Step to pattern, Forwards Number of Stairs: 5 (x2) General stair comments: No difficulty with getting up and down steps with bil UE support on rails; naturally performs stairs with a step-to pattern, "up with good, down with bad" without the need for cueing; takes steps quickly   Wheelchair Mobility     Tilt Bed    Modified Rankin (Stroke Patients Only)       Balance     Sitting balance-Leahy Scale: Normal       Standing balance-Leahy Scale: Poor                              Cognition Arousal: Alert Behavior During Therapy: Impulsive Overall Cognitive Status: History of cognitive impairments - at baseline                                 General Comments: Pt has hx of cognitive impairment at baseline, TBI, impulsive, difficulty maintaining attention and following directions; notable that he was able to remember that we walked to teh wall "with the clock" yesterday        Exercises      General Comments General comments (skin integrity, edema, etc.): Viria, Research officer, trade union, present and fcilitated clear communication      Pertinent Vitals/Pain Pain Assessment Pain Assessment: No/denies pain (Denies pain when asked; Still, noting decr stance time LLE, and pain can be a factor, along with other facotrs like decr base of support after amputation) Pain Intervention(s): Monitored during session    Home Living                          Prior Function            PT Goals (current goals can now be found in the care plan section) Acute Rehab PT Goals Patient Stated Goal: Able to voice need to get to teh bathroom PT Goal Formulation: With patient Time For Goal Achievement: 06/24/23 Potential to Achieve Goals: Good Progress towards PT goals: Progressing toward  goals    Frequency    Min 1X/week      PT Plan Current plan remains appropriate    Co-evaluation              AM-PAC PT "6 Clicks" Mobility   Outcome Measure  Help needed turning from your back to your side while in a flat bed without using bedrails?: None Help needed moving from lying on your back to sitting on the side of a flat bed without using bedrails?: None Help needed moving to and from a bed to a chair (including a wheelchair)?: A Little Help needed standing up from a chair using your arms (e.g., wheelchair or bedside chair)?: A Little Help needed to walk in hospital room?: A Lot Help needed climbing 3-5 steps with a railing? : Total 6 Click Score: 17    End of Session Equipment Utilized During Treatment: Gait belt Activity Tolerance: Patient tolerated treatment well Patient left: in bed;with call bell/phone within reach;with bed alarm set Nurse Communication: Mobility status PT Visit Diagnosis: Other abnormalities of  gait and mobility (R26.89)     Time: 1332-1400 PT Time Calculation (min) (ACUTE ONLY): 28 min  Charges:    $Gait Training: 23-37 mins PT General Charges $$ ACUTE PT VISIT: 1 Visit                     Van Clines, PT  Acute Rehabilitation Services Office [phone] Secure Chat welcomed    Levi Aland 06/30/2023, 9:05 PM

## 2023-07-01 LAB — GLUCOSE, CAPILLARY
Glucose-Capillary: 152 mg/dL — ABNORMAL HIGH (ref 70–99)
Glucose-Capillary: 178 mg/dL — ABNORMAL HIGH (ref 70–99)
Glucose-Capillary: 125 mg/dL — ABNORMAL HIGH (ref 70–99)
Glucose-Capillary: 104 mg/dL — ABNORMAL HIGH (ref 70–99)
Glucose-Capillary: 95 mg/dL (ref 70–99)

## 2023-07-01 NOTE — Plan of Care (Signed)
Problem: Education: Goal: Ability to describe self-care measures that may prevent or decrease complications (Diabetes Survival Skills Education) will improve Outcome: Not Progressing Goal: Individualized Educational Video(s) Outcome: Not Progressing   Problem: Coping: Goal: Ability to adjust to condition or change in health will improve Outcome: Not Progressing   Problem: Health Behavior/Discharge Planning: Goal: Ability to identify and utilize available resources and services will improve Outcome: Not Progressing Goal: Ability to manage health-related needs will improve Outcome: Not Progressing   Problem: Metabolic: Goal: Ability to maintain appropriate glucose levels will improve Outcome: Not Progressing   Problem: Nutritional: Goal: Progress toward achieving an optimal weight will improve Outcome: Not Progressing   Problem: Skin Integrity: Goal: Risk for impaired skin integrity will decrease Outcome: Not Progressing   Problem: Tissue Perfusion: Goal: Adequacy of tissue perfusion will improve Outcome: Not Progressing   Problem: Education: Goal: Knowledge of General Education information will improve Description: Including pain rating scale, medication(s)/side effects and non-pharmacologic comfort measures Outcome: Not Progressing   Problem: Health Behavior/Discharge Planning: Goal: Ability to manage health-related needs will improve Outcome: Not Progressing   Problem: Clinical Measurements: Goal: Ability to maintain clinical measurements within normal limits will improve Outcome: Not Progressing Goal: Will remain free from infection Outcome: Not Progressing Goal: Diagnostic test results will improve Outcome: Not Progressing Goal: Respiratory complications will improve Outcome: Not Progressing Goal: Cardiovascular complication will be avoided Outcome: Not Progressing   Problem: Activity: Goal: Risk for activity intolerance will decrease Outcome: Not  Progressing   Problem: Nutrition: Goal: Adequate nutrition will be maintained Outcome: Not Progressing   Problem: Coping: Goal: Level of anxiety will decrease Outcome: Not Progressing   Problem: Elimination: Goal: Will not experience complications related to bowel motility Outcome: Not Progressing Goal: Will not experience complications related to urinary retention Outcome: Not Progressing   Problem: Pain Managment: Goal: General experience of comfort will improve Outcome: Not Progressing   Problem: Safety: Goal: Ability to remain free from injury will improve Outcome: Not Progressing   Problem: Skin Integrity: Goal: Risk for impaired skin integrity will decrease Outcome: Not Progressing   Problem: Education: Goal: Knowledge of General Education information will improve Description: Including pain rating scale, medication(s)/side effects and non-pharmacologic comfort measures Outcome: Not Progressing   Problem: Health Behavior/Discharge Planning: Goal: Ability to manage health-related needs will improve Outcome: Not Progressing   Problem: Clinical Measurements: Goal: Ability to maintain clinical measurements within normal limits will improve Outcome: Not Progressing Goal: Will remain free from infection Outcome: Not Progressing Goal: Diagnostic test results will improve Outcome: Not Progressing Goal: Respiratory complications will improve Outcome: Not Progressing Goal: Cardiovascular complication will be avoided Outcome: Not Progressing   Problem: Activity: Goal: Risk for activity intolerance will decrease Outcome: Not Progressing   Problem: Nutrition: Goal: Adequate nutrition will be maintained Outcome: Not Progressing   Problem: Coping: Goal: Level of anxiety will decrease Outcome: Not Progressing   Problem: Elimination: Goal: Will not experience complications related to bowel motility Outcome: Not Progressing Goal: Will not experience complications  related to urinary retention Outcome: Not Progressing   Problem: Pain Managment: Goal: General experience of comfort will improve Outcome: Not Progressing   Problem: Safety: Goal: Ability to remain free from injury will improve Outcome: Not Progressing   Problem: Skin Integrity: Goal: Risk for impaired skin integrity will decrease Outcome: Not Progressing   Problem: Education: Goal: Knowledge of the prescribed therapeutic regimen will improve Outcome: Not Progressing Goal: Ability to verbalize activity precautions or restrictions will improve Outcome: Not Progressing Goal:  Understanding of discharge needs will improve Outcome: Not Progressing   Problem: Activity: Goal: Ability to perform//tolerate increased activity and mobilize with assistive devices will improve Outcome: Not Progressing
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
## 2023-07-01 NOTE — Progress Notes (Signed)
Mobility Specialist: Progress Note   07/01/23 1524  Mobility  Activity Ambulated with assistance in hallway  Level of Assistance Contact guard assist, steadying assist  Assistive Device Other (Comment) (HHA)  Distance Ambulated (ft) 170 ft  LLE Weight Bearing WBAT (with CAM boot)  Activity Response Tolerated well  Mobility Referral Yes  $Mobility charge 1 Mobility  Mobility Specialist Start Time (ACUTE ONLY) 1330  Mobility Specialist Stop Time (ACUTE ONLY) 1339  Mobility Specialist Time Calculation (min) (ACUTE ONLY) 9 min    Pt was agreeable to mobility session - received in bed. SV for STS with verbal and tactile cues to sit on EOB. HHA with minG for transfer and ambulation d/t unstable, shaky gait and wide BOS. No LOB and no c/o throughout. Returned to bed without fault. Left in bed with all needs met, call bell in reach.   [name] Mobility Specialist Please contact via SecureChat or Rehab office at [phone]

## 2023-07-01 NOTE — TOC Progression Note (Signed)
Transition of Care [facility]) - Progression Note    Patient Details  Name: [name] MRN: [id number] Date of Birth: 18-Apr-1966  Transition of Care [facility]) CM/SW Contact  Lorri Frederick, LCSW Phone Number: 07/01/2023, 8:50 AM  Clinical Narrative:   Message from [facility] supervisor Verdon Cummins Scinto: pt still under review at [facility].  No available beds. TOC to continue to follow.      Expected Discharge Plan: Home w Home Health Services Barriers to Discharge: No Barriers Identified  Expected Discharge Plan and Services       Living arrangements for the past 2 months: Single Family Home                                       Social Determinants of Health (SDOH) Interventions SDOH Screenings   Depression (PHQ2-9): Low Risk  (03/11/2023)  Tobacco Use: Low Risk  (06/02/2023)    Readmission Risk Interventions     No data to display

## 2023-07-01 NOTE — Progress Notes (Signed)
Occupational Therapy Treatment Patient Details Name: [name] MRN: [id number] DOB: [birth year] Today's Date: 07/01/2023   History of present illness Pt is a 57 year old male admitted for evaluation of being unable to get up and ambulate, and not eating for 2 days, admitted for left foot gangrene with osteomyelitis . 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   OT comments  Patient used call button to ask for assistance with going to bathroom. Patient able to get to EOB without assistance and min assist to donn post-op shoe on LLE. Patient was min assist to ambulate to bathroom and performed toilet hygiene seated. Patient stood at sink for hand hygiene and oral care. Patient instructed on donning paper scrubs with supervision for UB and CGA for LB while standing to pull up pants. Acute OT to continue to follow to address LB dressing and transfers.       If plan is discharge home, recommend the following:  A little help with walking and/or transfers;A little help with bathing/dressing/bathroom;Assistance with cooking/housework;Assist for transportation   Equipment Recommendations  Other (comment) (defer to next venue of care)    Recommendations for Other Services      Precautions / Restrictions Precautions Precautions: Fall;Other (comment) Precaution Comments: History of TBI with left side weakness and cognitive impairments Required Braces or Orthoses: Other Brace Other Brace: L post op shoe Restrictions Weight Bearing Restrictions: No LLE Weight Bearing: Weight bearing as tolerated Other Position/Activity Restrictions: Upgraded to WBAT with postop shoe.       Mobility Bed Mobility Overal bed mobility: Needs Assistance Bed Mobility: Supine to Sit, Sit to Supine     Supine to sit: Supervision, Used rails Sit to supine:  Supervision   General bed mobility comments: cues for positioning self in bed    Transfers Overall transfer level: Needs assistance Equipment used: 1 person hand held assist Transfers: Sit to/from Stand Sit to Stand: Min assist                 Balance Overall balance assessment: Needs assistance Sitting-balance support: No upper extremity supported, Feet supported Sitting balance-Leahy Scale: Normal     Standing balance support: Single extremity supported, During functional activity Standing balance-Leahy Scale: Poor                             ADL either performed or assessed with clinical judgement   ADL Overall ADL's : Needs assistance/impaired     Grooming: Wash/dry hands;Wash/dry face;Oral care;Supervision/safety;Standing Grooming Details (indicate cue type and reason): at sink         Upper Body Dressing : Supervision/safety;Sitting Upper Body Dressing Details (indicate cue type and reason): donn paper scrub top seated on EOB Lower Body Dressing: Minimal assistance;Sitting/lateral leans;Sit to/from stand Lower Body Dressing Details (indicate cue type and reason): min assist with donning post-op shoe, CGA for paper scrub pants Toilet Transfer: Minimal assistance;Ambulation;Grab Paramedic Details (indicate cue type and reason): cues for safety Toileting- Clothing Manipulation and Hygiene: Supervision/safety;Sitting/lateral lean         General ADL Comments: cues and supervision for safety due to impulsiveness    Extremity/Trunk Assessment              Vision       Perception     Praxis      Cognition Arousal: Alert  Behavior During Therapy: Impulsive Overall Cognitive Status: History of cognitive impairments - at baseline                                 General Comments: hx of cognitive impairment, impulsive, requires cues for safety and to ask for assistance        Exercises       Shoulder Instructions       General Comments      Pertinent Vitals/ Pain       Pain Assessment Pain Assessment: No/denies pain Pain Intervention(s): Monitored during session  Home Living                                          Prior Functioning/Environment              Frequency  Min 1X/week        Progress Toward Goals  OT Goals(current goals can now be found in the care plan section)  Progress towards OT goals: Progressing toward goals  Acute Rehab OT Goals OT Goal Formulation: Patient unable to participate in goal setting Time For Goal Achievement: 07/12/23 Potential to Achieve Goals: Good ADL Goals Pt Will Perform Lower Body Dressing: with set-up;sit to/from stand Pt Will Transfer to Toilet: with supervision;ambulating;regular height toilet;grab bars Pt Will Perform Toileting - Clothing Manipulation and hygiene: with supervision;sit to/from stand Additional ADL Goal #1: Pt will independently call/request assistance when necessary for toileting to increase bladder/bowl continence and increase ability to manage personal/toilet hygiene.  Plan      Co-evaluation                 AM-PAC OT "6 Clicks" Daily Activity     Outcome Measure   Help from another person eating meals?: None Help from another person taking care of personal grooming?: A Little Help from another person toileting, which includes using toliet, bedpan, or urinal?: A Lot Help from another person bathing (including washing, rinsing, drying)?: A Lot Help from another person to put on and taking off regular upper body clothing?: A Little Help from another person to put on and taking off regular lower body clothing?: A Little 6 Click Score: 17    End of Session Equipment Utilized During Treatment: Gait belt  OT Visit Diagnosis: Unsteadiness on feet (R26.81);Other abnormalities of gait and mobility (R26.89);Muscle weakness (generalized) (M62.81);Other symptoms and signs  involving cognitive function;Pain   Activity Tolerance Patient tolerated treatment well   Patient Left in bed;with call bell/phone within reach;with bed alarm set   Nurse Communication Mobility status        Time: 1610-9604 OT Time Calculation (min): 18 min  Charges: OT General Charges $OT Visit: 1 Visit OT Treatments $Self Care/Home Management : 8-22 mins  [name], OTA Acute Rehabilitation Services  Office [phone]   [name] 07/01/2023, 1:49 PM

## 2023-07-02 LAB — GLUCOSE, CAPILLARY
Glucose-Capillary: 144 mg/dL — ABNORMAL HIGH (ref 70–99)
Glucose-Capillary: 91 mg/dL (ref 70–99)
Glucose-Capillary: 130 mg/dL — ABNORMAL HIGH (ref 70–99)
Glucose-Capillary: 120 mg/dL — ABNORMAL HIGH (ref 70–99)

## 2023-07-02 NOTE — Progress Notes (Signed)
[facility] PROGRESS NOTE  [name] (DOB: [birth year]) ZOX:[id number] PCP: Marcine Matar, MD  Brief Narrative: [name] is a 57 y.o. 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 He underwent left TMA with wound VAC by Dr. Lajoyce Corners on 06/02/2023. Therapy recommended home health but patient has no safe disposition. Per sister's report to [facility], "he was staying with some people because he couldn't stay with sister anymore due to violent behaviors. Sister was bringing food and they were letting him stay for free. And now those people are saying he can't come back and sister can't take him either". TOC filed APS report.  Now a placement issue.   Subjective: No complaints offered.  Objective: BP 121/69 (BP Location: Right Arm)   Pulse 70   Temp 98 F (36.7 C) (Oral)   Resp 18   Wt 54.6 kg   SpO2 100%   BMI 22.02 kg/m    Alert.  In no distress.    Assessment & Plan:  Left foot cellulitis with gangrene/osteomyelitis -MRI with osteo of the 2nd toe and plantar muscles suggesting myositis He was seen by the vascular surgeon and there was no indication for vascular intervention. -S/p left TMA and wound VAC placement by Dr. Lajoyce Corners on 7/31. Fell OOB tearing drainage tube, so it was not replaced 8/8. Reevaluation 8/20 > wound looks good, will remove sutures in 1-2 weeks.  - Per ortho, surgical source control achieved.  Antibiotic discontinued. - Continue tylenol prn pain. Seen by PT and OT.  Initially SNF was recommended but now recommendation is for outpatient physical therapy. Remains stable.  Denies any pain.   H/o TBI/cognitive impairment and intermittent aggressive behaviors/Depression - Continue risperdal, prozac and hydroxyzine - Psychiatry evaluation appreciated: Patient does not  currently have capacity   Unsafe living situation According to sister patient has been living in a filthy, unsafe living situation.  Patient with TBI/cognitive impairment.  Has no capacity to make medical decision. TOC is following.  APS is also following.  Disposition plan is unclear as yet.   T2DM with hyperglycemia HbA1c 5.8%.  - Continue glargine 10u daily and SSI.    Anemia of chronic disease : Stable. - Monitor intermittently.   Fall in the hospital No apparent injury or acute focal neurodeficit. - Fall precautions   Seizure disorder: - Continue keppra 500mg  BID    Hyponatremia Resolved   Hypertension Normotensive - Continue lisinopril.   Hyperlipidemia - Continue statin  DVT prophylaxis: Lovenox CODE STATUS: Full code Family communication: No family at bedside Disposition: Pending   Osvaldo Shipper,  [facility] [email or web address] 07/02/2023, 10:30 AM

## 2023-07-02 NOTE — Progress Notes (Signed)
Physical Therapy Treatment Patient Details Name: [name] MRN: [id number] DOB: 04-Sep-1966 Today's Date: 07/02/2023   History of Present Illness Pt is a 57 year old male admitted for evaluation of being unable to get up and ambulate, and not eating for 2 days, admitted for left foot gangrene with osteomyelitis. 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    PT Comments  The pt was agreeable to session, continues to make slow but steady progress with gait progression and mobility. Continues efforts this session to progress stride length and clearance as well as improve stability and decrease assistance needed. Pt able to make corrections when cued, but demos poor ability to maintain corrections for more than 4-5 steps. Is at high risk for falls, especially with fatigue as the pt will rush and demo significantly impaired safety awareness when rushing. Will continue to benefit from skilled PT acutely to progress mobility and safety.     If plan is discharge home, recommend the following: A little help with walking and/or transfers;A little help with bathing/dressing/bathroom;Assistance with cooking/housework;Direct supervision/assist for medications management;Assist for transportation;Help with stairs or ramp for entrance   Can travel by private vehicle     No  Equipment Recommendations   (will continue to discern)    Recommendations for Other Services       Precautions / Restrictions Precautions Precautions: Fall;Other (comment) Precaution Comments: History of TBI with left side weakness and cognitive impairments Required Braces or Orthoses: Other Brace Other Brace: L post op shoe Restrictions Weight Bearing Restrictions: No LLE Weight Bearing: Weight bearing as tolerated Other Position/Activity Restrictions: Upgraded to WBAT with postop shoe.     Mobility  Bed Mobility Overal bed  mobility: Needs Assistance Bed Mobility: Supine to Sit, Sit to Supine     Supine to sit: Supervision, Used rails Sit to supine: Supervision   General bed mobility comments: cues for positioning self in bed    Transfers Overall transfer level: Needs assistance Equipment used: 1 person hand held assist, Rolling walker (2 wheels) Transfers: Sit to/from Stand Sit to Stand: Min assist           General transfer comment: attempted with HHA and RW, pt able to rise with minA, prefers UE support    Ambulation/Gait Ambulation/Gait assistance: Min assist Gait Distance (Feet): 75 Feet (+ 150) Assistive device: 1 person hand held assist, Rolling walker (2 wheels) Gait Pattern/deviations: Shuffle, Festinating, Staggering left, Staggering right, Wide base of support Gait velocity: too quick for safety     General Gait Details: continues to walk with short, fast steps. often step-to pattern with LLE behind RLE. pt initially does well with cues for larger steps, but is unable to maintain these gait corrections. minA to modA to slow RW, increased assist as pt fatigues   Optometrist     Tilt Bed    Modified Rankin (Stroke Patients Only)       Balance Overall balance assessment: Needs assistance Sitting-balance support: No upper extremity supported, Feet supported Sitting balance-Leahy Scale: Normal     Standing balance support: Single extremity supported, During functional activity Standing balance-Leahy Scale: Poor                              Cognition Arousal: Alert Behavior During Therapy: Impulsive Overall Cognitive Status: History of  cognitive impairments - at baseline                                 General Comments: hx of cognitive impairment, impulsive, requires cues for safety and to ask for assistance. needs repeated cues to maintain corrections        Exercises Other Exercises Other Exercises:  standing marches x10 Other Exercises: walking marches with max cues and multimodal cues    General Comments General comments (skin integrity, edema, etc.): spanish interpreter present in-person      Pertinent Vitals/Pain Pain Assessment Pain Assessment: No/denies pain Pain Intervention(s): Monitored during session           PT Goals (current goals can now be found in the care plan section) Acute Rehab PT Goals Patient Stated Goal: Able to voice need to get to the bathroom PT Goal Formulation: With patient Time For Goal Achievement: 06/24/23 Potential to Achieve Goals: Good Progress towards PT goals: Progressing toward goals    Frequency    Min 1X/week      PT Plan Current plan remains appropriate       AM-PAC PT "6 Clicks" Mobility   Outcome Measure  Help needed turning from your back to your side while in a flat bed without using bedrails?: None Help needed moving from lying on your back to sitting on the side of a flat bed without using bedrails?: None Help needed moving to and from a bed to a chair (including a wheelchair)?: A Little Help needed standing up from a chair using your arms (e.g., wheelchair or bedside chair)?: A Little Help needed to walk in hospital room?: A Lot Help needed climbing 3-5 steps with a railing? : Total 6 Click Score: 17    End of Session Equipment Utilized During Treatment: Gait belt Activity Tolerance: Patient tolerated treatment well Patient left: in bed;with call bell/phone within reach;with bed alarm set Nurse Communication: Mobility status PT Visit Diagnosis: Other abnormalities of gait and mobility (R26.89)     Time: 6789-3810 PT Time Calculation (min) (ACUTE ONLY): 31 min  Charges:    $Gait Training: 23-37 mins PT General Charges $$ ACUTE PT VISIT: 1 Visit                     Vickki Muff, PT, DPT   Acute Rehabilitation Department Office [phone] Secure Chat Communication Preferred   [name] 07/02/2023, 4:06 PM

## 2023-07-03 LAB — GLUCOSE, CAPILLARY
Glucose-Capillary: 172 mg/dL — ABNORMAL HIGH (ref 70–99)
Glucose-Capillary: 100 mg/dL — ABNORMAL HIGH (ref 70–99)
Glucose-Capillary: 98 mg/dL (ref 70–99)
Glucose-Capillary: 98 mg/dL (ref 70–99)
Glucose-Capillary: 92 mg/dL (ref 70–99)

## 2023-07-03 NOTE — Progress Notes (Signed)
[facility] PROGRESS NOTE  Tupac Sunde Cardenas (DOB: 30-Mar-1966) [id number]:[id number] PCP: Marcine Matar, MD  Brief Narrative: [name] is a 57 y.o. 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 He underwent left TMA with wound VAC by Dr. Lajoyce Corners on 06/02/2023. Therapy recommended home health but patient has no safe disposition. Per sister's report to [facility], "he was staying with some people because he couldn't stay with sister anymore due to violent behaviors. Sister was bringing food and they were letting him stay for free. And now those people are saying he can't come back and sister can't take him either". TOC filed APS report.  Now a placement issue.   Subjective: Denies any complaints.  Objective: BP (!) 93/59 (BP Location: Right Arm)   Pulse 67   Temp 97.6 F (36.4 C) (Oral)   Resp 17   Wt 54.6 kg   SpO2 99%   BMI 22.02 kg/m    Awake alert no distress.    Assessment & Plan:  Left foot cellulitis with gangrene/osteomyelitis -MRI with osteo of the 2nd toe and plantar muscles suggesting myositis He was seen by the vascular surgeon and there was no indication for vascular intervention. -S/p left TMA and wound VAC placement by Dr. Lajoyce Corners on 7/31. Fell OOB tearing drainage tube, so it was not replaced 8/8. Reevaluation 8/20 > wound looks good, will remove sutures in 1-2 weeks.  Per ortho, surgical source control achieved.  Antibiotic discontinued. Seen by PT and OT.  Initially SNF was recommended but now recommendation is for outpatient physical therapy. Remains stable.  Denies any pain.   H/o TBI/cognitive impairment and intermittent aggressive behaviors/Depression - Continue risperdal, prozac and hydroxyzine - Psychiatry evaluation appreciated: Patient does not currently have capacity    Unsafe living situation According to sister patient has been living in a filthy, unsafe living situation.  Patient with TBI/cognitive impairment.  Has no capacity to make medical decision. TOC is following.  APS is also following.  Disposition plan is unclear as yet.   T2DM with hyperglycemia HbA1c 5.8%.  Continue glargine 10u daily and SSI.    Anemia of chronic disease : Stable. - Monitor intermittently.   Fall in the hospital No apparent injury or acute focal neurodeficit. - Fall precautions   Seizure disorder: - Continue keppra 500mg  BID    Hyponatremia Resolved   Hypertension Low normal Blood pressure readings noted.  Will discontinue lisinopril.   Hyperlipidemia - Continue statin  DVT prophylaxis: Lovenox CODE STATUS: Full code Family communication: No family at bedside Disposition: Pending   Osvaldo Shipper,  [facility] [email or web address] 07/03/2023, 9:49 AM

## 2023-07-03 NOTE — Plan of Care (Signed)
 Problem: Education: Goal: Ability to describe self-care measures that may prevent or decrease complications (Diabetes Survival Skills Education) will improve Outcome: Not Progressing Goal: Individualized Educational Video(s) Outcome: Not Progressing   Problem: Coping: Goal: Ability to adjust to condition or change in health will improve Outcome: Not Progressing   Problem: Health Behavior/Discharge Planning: Goal: Ability to identify and utilize available resources and services will improve Outcome: Not Progressing Goal: Ability to manage health-related needs will improve Outcome: Not Progressing   Problem: Metabolic: Goal: Ability to maintain appropriate glucose levels will improve Outcome: Not Progressing   Problem: Nutritional: Goal: Progress toward achieving an optimal weight will improve Outcome: Not Progressing   Problem: Skin Integrity: Goal: Risk for impaired skin integrity will decrease Outcome: Not Progressing   Problem: Tissue Perfusion: Goal: Adequacy of tissue perfusion will improve Outcome: Not Progressing   Problem: Education: Goal: Knowledge of General Education information will improve Description: Including pain rating scale, medication(s)/side effects and non-pharmacologic comfort measures Outcome: Not Progressing   Problem: Health Behavior/Discharge Planning: Goal: Ability to manage health-related needs will improve Outcome: Not Progressing   Problem: Clinical Measurements: Goal: Ability to maintain clinical measurements within normal limits will improve Outcome: Not Progressing Goal: Will remain free from infection Outcome: Not Progressing Goal: Diagnostic test results will improve Outcome: Not Progressing Goal: Respiratory complications will improve Outcome: Not Progressing Goal: Cardiovascular complication will be avoided Outcome: Not Progressing   Problem: Activity: Goal: Risk for activity intolerance will decrease Outcome: Not  Progressing   Problem: Nutrition: Goal: Adequate nutrition will be maintained Outcome: Not Progressing   Problem: Coping: Goal: Level of anxiety will decrease Outcome: Not Progressing   Problem: Elimination: Goal: Will not experience complications related to bowel motility Outcome: Not Progressing Goal: Will not experience complications related to urinary retention Outcome: Not Progressing   Problem: Pain Managment: Goal: General experience of comfort will improve Outcome: Not Progressing   Problem: Safety: Goal: Ability to remain free from injury will improve Outcome: Not Progressing   Problem: Skin Integrity: Goal: Risk for impaired skin integrity will decrease Outcome: Not Progressing   Problem: Education: Goal: Knowledge of General Education information will improve Description: Including pain rating scale, medication(s)/side effects and non-pharmacologic comfort measures Outcome: Not Progressing   Problem: Health Behavior/Discharge Planning: Goal: Ability to manage health-related needs will improve Outcome: Not Progressing   Problem: Clinical Measurements: Goal: Ability to maintain clinical measurements within normal limits will improve Outcome: Not Progressing Goal: Will remain free from infection Outcome: Not Progressing Goal: Diagnostic test results will improve Outcome: Not Progressing Goal: Respiratory complications will improve Outcome: Not Progressing Goal: Cardiovascular complication will be avoided Outcome: Not Progressing   Problem: Activity: Goal: Risk for activity intolerance will decrease Outcome: Not Progressing   Problem: Nutrition: Goal: Adequate nutrition will be maintained Outcome: Not Progressing   Problem: Coping: Goal: Level of anxiety will decrease Outcome: Not Progressing   Problem: Elimination: Goal: Will not experience complications related to bowel motility Outcome: Not Progressing Goal: Will not experience complications  related to urinary retention Outcome: Not Progressing   Problem: Pain Managment: Goal: General experience of comfort will improve Outcome: Not Progressing   Problem: Safety: Goal: Ability to remain free from injury will improve Outcome: Not Progressing   Problem: Skin Integrity: Goal: Risk for impaired skin integrity will decrease Outcome: Not Progressing   Problem: Education: Goal: Knowledge of the prescribed therapeutic regimen will improve Outcome: Not Progressing Goal: Ability to verbalize activity precautions or restrictions will improve Outcome: Not Progressing Goal:  Understanding of discharge needs will improve Outcome: Not Progressing   Problem: Activity: Goal: Ability to perform//tolerate increased activity and mobilize with assistive devices will improve Outcome: Not Progressing

## 2023-07-04 LAB — GLUCOSE, CAPILLARY
Glucose-Capillary: 129 mg/dL — ABNORMAL HIGH (ref 70–99)
Glucose-Capillary: 124 mg/dL — ABNORMAL HIGH (ref 70–99)
Glucose-Capillary: 97 mg/dL (ref 70–99)
Glucose-Capillary: 98 mg/dL (ref 70–99)

## 2023-07-04 NOTE — Plan of Care (Signed)
  Problem: Health Behavior/Discharge Planning: Goal: Ability to manage health-related needs will improve Outcome: Progressing   Problem: Metabolic: Goal: Ability to maintain appropriate glucose levels will improve Outcome: Progressing   Problem: Skin Integrity: Goal: Risk for impaired skin integrity will decrease Outcome: Progressing

## 2023-07-04 NOTE — Plan of Care (Signed)
  Problem: Education: Goal: Ability to describe self-care measures that may prevent or decrease complications (Diabetes Survival Skills Education) will improve Outcome: Progressing   Problem: Skin Integrity: Goal: Risk for impaired skin integrity will decrease Outcome: Progressing   Problem: Education: Goal: Knowledge of General Education information will improve Description: Including pain rating scale, medication(s)/side effects and non-pharmacologic comfort measures Outcome: Progressing   Problem: Activity: Goal: Risk for activity intolerance will decrease Outcome: Progressing

## 2023-07-04 NOTE — Progress Notes (Signed)
[facility] PROGRESS NOTE  [name] (DOB: 1966-11-02) UXL:[id number] PCP: Marcine Matar, MD  Brief Narrative: [name] is a 57 y.o. 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 He underwent left TMA with wound VAC by Dr. Lajoyce Corners on 06/02/2023. Therapy recommended home health but patient has no safe disposition. Per sister's report to [facility], "he was staying with some people because he couldn't stay with sister anymore due to violent behaviors. Sister was bringing food and they were letting him stay for free. And now those people are saying he can't come back and sister can't take him either". TOC filed APS report.  Now a placement issue.   Subjective: No complaints offered.  Objective: BP 103/69   Pulse 70   Temp 98.5 F (36.9 C) (Oral)   Resp 17   Wt 54.6 kg   SpO2 97%   BMI 22.02 kg/m    Awake alert.  In no distress. Lungs are clear to auscultation bilaterally. S1-S2 is normal regular. Abdomen is soft.  Nontender nondistended.    Assessment & Plan:  Left foot cellulitis with gangrene/osteomyelitis -MRI with osteo of the 2nd toe and plantar muscles suggesting myositis He was seen by the vascular surgeon and there was no indication for vascular intervention. -S/p left TMA and wound VAC placement by Dr. Lajoyce Corners on 7/31. Fell OOB tearing drainage tube, so it was not replaced 8/8. Reevaluation 8/20 > wound looks good, will remove sutures in 1-2 weeks.  Per ortho, surgical source control achieved.  Antibiotic discontinued. Seen by PT and OT.  Initially SNF was recommended but now recommendation is for outpatient physical therapy. Stable for the most part.   H/o TBI/cognitive impairment and intermittent aggressive behaviors/Depression - Continue risperdal, prozac and hydroxyzine -  Psychiatry evaluation appreciated: Patient does not currently have capacity   Unsafe living situation According to sister patient has been living in a filthy, unsafe living situation.  Patient with TBI/cognitive impairment.  Has no capacity to make medical decision. TOC is following.  APS is also following.  Disposition plan is unclear as yet.   T2DM with hyperglycemia HbA1c 5.8%.  Continue glargine 10u daily and SSI.    Anemia of chronic disease - Monitor intermittently.   Fall in the hospital No apparent injury or acute focal neurodeficit. - Fall precautions   Seizure disorder: - Continue keppra 500mg  BID    Hyponatremia Resolved   Hypertension Patient was on low-dose lisinopril which was discontinued on 8/31 due to borderline low blood pressures.  Continue to monitor.     Hyperlipidemia - Continue statin  DVT prophylaxis: Lovenox CODE STATUS: Full code Family communication: No family at bedside Disposition: Pending   [name],  [facility] [email or web address] 07/04/2023, 9:24 AM

## 2023-07-05 LAB — GLUCOSE, CAPILLARY
Glucose-Capillary: 134 mg/dL — ABNORMAL HIGH (ref 70–99)
Glucose-Capillary: 90 mg/dL (ref 70–99)
Glucose-Capillary: 114 mg/dL — ABNORMAL HIGH (ref 70–99)
Glucose-Capillary: 142 mg/dL — ABNORMAL HIGH (ref 70–99)

## 2023-07-05 NOTE — Progress Notes (Signed)
Mobility Specialist: Progress Note   07/05/23 1526  Mobility  Activity Ambulated with assistance in hallway  Level of Assistance Contact guard assist, steadying assist  Assistive Device Front wheel walker  Distance Ambulated (ft) 300 ft  LLE Weight Bearing WBAT (with post op shoes)  Activity Response Tolerated well  Mobility Referral Yes  $Mobility charge 1 Mobility  Mobility Specialist Start Time (ACUTE ONLY) 1326  Mobility Specialist Stop Time (ACUTE ONLY) 1334  Mobility Specialist Time Calculation (min) (ACUTE ONLY) 8 min    Pt was agreeable to mobility session - received in bed. Interpreter present and extremely helpful throughout session. SV for bed mobility, pt preferred RW use during ambulation for UE stability. CG for ambulation. Pt was receptive to verbal and tactile cues for taking slower, larger steps and proper RW use/position - d/t cognitive deficits pt was not able to follow instructions for much long before reverting back to short, fast steps and unstable gait pattern. Returned to bed without fault. Left in bed with all needs met, call bell in reach.   [name] Mobility Specialist Please contact via SecureChat or Rehab office at [phone]

## 2023-07-05 NOTE — Progress Notes (Signed)
[facility] PROGRESS NOTE  [name] (DOB: 1966/04/09) ZOX:[id number] PCP: Marcine Matar, MD  Brief Narrative: [name] is a 57 y.o. 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 He underwent left TMA with wound VAC by Dr. Lajoyce Corners on 06/02/2023. Therapy recommended home health but patient has no safe disposition. Per sister's report to [facility], "he was staying with some people because he couldn't stay with sister anymore due to violent behaviors. Sister was bringing food and they were letting him stay for free. And now those people are saying he can't come back and sister can't take him either". TOC filed APS report.  Now a placement issue.   Subjective: No complaints offered  Objective: BP 102/69 (BP Location: Left Arm)   Pulse 72   Temp 97.6 F (36.4 C) (Oral)   Resp 16   Wt 54.6 kg   SpO2 98%   BMI 22.02 kg/m    Awake alert.  In no distress    Assessment & Plan:  Left foot cellulitis with gangrene/osteomyelitis -MRI with osteo of the 2nd toe and plantar muscles suggesting myositis He was seen by the vascular surgeon and there was no indication for vascular intervention. -S/p left TMA and wound VAC placement by Dr. Lajoyce Corners on 7/31. Fell OOB tearing drainage tube, so it was not replaced 8/8. Reevaluation 8/20 > wound looks good, will remove sutures in 1-2 weeks.  Per ortho, surgical source control achieved.  Antibiotic discontinued. Seen by PT and OT.  Initially SNF was recommended but now recommendation is for outpatient physical therapy. Stable for the most part.   H/o TBI/cognitive impairment and intermittent aggressive behaviors/Depression - Continue risperdal, prozac and hydroxyzine - Psychiatry evaluation appreciated: Patient does not currently have capacity   Unsafe  living situation According to sister patient has been living in a filthy, unsafe living situation.  Patient with TBI/cognitive impairment.  Has no capacity to make medical decision. TOC is following.  APS is also following.  Disposition plan is unclear as yet.   T2DM with hyperglycemia HbA1c 5.8%.  Continue glargine 10u daily and SSI.    Anemia of chronic disease - Monitor intermittently.   Fall in the hospital No apparent injury or acute focal neurodeficit. - Fall precautions   Seizure disorder: - Continue keppra 500mg  BID    Hyponatremia Resolved   Hypertension Patient was on low-dose lisinopril which was discontinued on 8/31 due to borderline low blood pressures.  Blood pressure has stabilized.   Hyperlipidemia - Continue statin  DVT prophylaxis: Lovenox CODE STATUS: Full code Family communication: No family at bedside Disposition: Pending  This is a no charge note.  [name],  [facility] [email or web address] 07/05/2023, 9:24 AM

## 2023-07-05 NOTE — Plan of Care (Signed)
  Problem: Education: Goal: Ability to describe self-care measures that may prevent or decrease complications (Diabetes Survival Skills Education) will improve Outcome: Progressing   Problem: Skin Integrity: Goal: Risk for impaired skin integrity will decrease Outcome: Progressing   Problem: Pain Managment: Goal: General experience of comfort will improve Outcome: Progressing   Problem: Safety: Goal: Ability to remain free from injury will improve Outcome: Progressing   Problem: Skin Integrity: Goal: Risk for impaired skin integrity will decrease Outcome: Progressing   

## 2023-07-05 NOTE — Plan of Care (Signed)
  Problem: Skin Integrity: Goal: Risk for impaired skin integrity will decrease Outcome: Progressing   Problem: Education: Goal: Knowledge of General Education information will improve Description: Including pain rating scale, medication(s)/side effects and non-pharmacologic comfort measures Outcome: Progressing   Problem: Health Behavior/Discharge Planning: Goal: Ability to manage health-related needs will improve Outcome: Progressing

## 2023-07-06 LAB — GLUCOSE, CAPILLARY
Glucose-Capillary: 150 mg/dL — ABNORMAL HIGH (ref 70–99)
Glucose-Capillary: 113 mg/dL — ABNORMAL HIGH (ref 70–99)
Glucose-Capillary: 133 mg/dL — ABNORMAL HIGH (ref 70–99)
Glucose-Capillary: 68 mg/dL — ABNORMAL LOW (ref 70–99)
Glucose-Capillary: 112 mg/dL — ABNORMAL HIGH (ref 70–99)

## 2023-07-06 NOTE — Plan of Care (Signed)
  Problem: Coping: Goal: Ability to adjust to condition or change in health will improve Outcome: Progressing   Problem: Health Behavior/Discharge Planning: Goal: Ability to manage health-related needs will improve Outcome: Progressing   Problem: Skin Integrity: Goal: Risk for impaired skin integrity will decrease Outcome: Progressing

## 2023-07-06 NOTE — Plan of Care (Signed)
  Problem: Clinical Measurements: Goal: Ability to maintain clinical measurements within normal limits will improve Outcome: Progressing   Problem: Activity: Goal: Risk for activity intolerance will decrease Outcome: Progressing   

## 2023-07-06 NOTE — Progress Notes (Signed)
Mobility Specialist: Progress Note   07/06/23 1528  Mobility  Activity Ambulated with assistance in hallway  Level of Assistance Contact guard assist, steadying assist  Assistive Device Front wheel walker  Distance Ambulated (ft) 550 ft  LLE Weight Bearing WBAT (with post op boot)  Activity Response Tolerated well  Mobility Referral Yes  $Mobility charge 1 Mobility  Mobility Specialist Start Time (ACUTE ONLY) 1328  Mobility Specialist Stop Time (ACUTE ONLY) 1346  Mobility Specialist Time Calculation (min) (ACUTE ONLY) 18 min    Pt was agreeable to mobility session - received in bed. SV for bed mobility and transfer, CG for ambulation. Interpreter was present and helpful throughout session. Did extremely well today; took slower, larger steps and doing well with holding the RW closer to body during ambulation. Required less verbal and tactile cues to correct gait. Pt ambulates with more stability and even stopped to correct himself once. Returned to room without fault. Ambulated to bathroom with HHA and use of handrails. Shaky gait d/t less UE stability but no LOB. Left in bed with all needs met, call bell in reach. Family in room.    [name] Mobility Specialist Please contact via SecureChat or Rehab office at [phone]
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
## 2023-07-06 NOTE — Progress Notes (Signed)
Occupational Therapy Treatment Patient Details Name: Danelle Sandstrom MRN: [id number] DOB: Mar 27, 1966 Today's Date: 07/06/2023   History of present illness Pt is a 57 year old male admitted for evaluation of being unable to get up and ambulate, and not eating for 2 days, admitted for left foot gangrene with osteomyelitis. 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   OT comments  Pt in good spirits, no c/o or pain or discomfort. Pt able to ambualte around room with HHA min A for stability. Pt overall doing well, good strength/endurance for activities. Pt able to perform dressing at EOB/bed level, able to take off bed sheets and replace with min A for verbal cueing and help with fitted sheet. Pt has difficulty problem solving when unable to get fitted sheet on all the way even after demonstration. Pt tolerated session well, continued skilled OT to maximize functional independence, DC plan still appropriate.       If plan is discharge home, recommend the following:  A little help with walking and/or transfers;A little help with bathing/dressing/bathroom;Assistance with cooking/housework;Assist for transportation   Equipment Recommendations  Other (comment) (defer)    Recommendations for Other Services      Precautions / Restrictions Precautions Precautions: Fall;Other (comment) Precaution Comments: History of TBI with left side weakness and cognitive impairments Required Braces or Orthoses: Other Brace Other Brace: L post op shoe Restrictions Weight Bearing Restrictions: No LLE Weight Bearing: Weight bearing as tolerated Other Position/Activity Restrictions: Upgraded to WBAT with postop shoe.       Mobility Bed Mobility Overal bed mobility: Needs Assistance Bed Mobility: Supine to Sit, Sit to Supine     Supine to sit: Supervision Sit to supine: Supervision   General bed mobility  comments: supervision for safety    Transfers Overall transfer level: Needs assistance Equipment used: 1 person hand held assist Transfers: Sit to/from Stand Sit to Stand: Supervision     Step pivot transfers: Min assist     General transfer comment: HHA min A for ambulation, sit to stand good, supervision     Balance Overall balance assessment: Needs assistance Sitting-balance support: No upper extremity supported, Feet supported Sitting balance-Leahy Scale: Normal Sitting balance - Comments: ADL completion   Standing balance support: Single extremity supported, During functional activity Standing balance-Leahy Scale: Fair Standing balance comment: able to stand unsupported at bedside, HHA for ambulation.                           ADL either performed or assessed with clinical judgement   ADL Overall ADL's : Needs assistance/impaired Eating/Feeding: Set up;Bed level   Grooming: Wash/dry hands;Wash/dry face;Oral care;Supervision/safety;Standing Grooming Details (indicate cue type and reason): at sink         Upper Body Dressing : Supervision/safety;Sitting   Lower Body Dressing: Set up;Sitting/lateral leans Lower Body Dressing Details (indicate cue type and reason): able to perform LB dressing bed level/sitting Toilet Transfer: Minimal assistance Toilet Transfer Details (indicate cue type and reason): HHA Toileting- Clothing Manipulation and Hygiene: Supervision/safety;Sitting/lateral lean       Functional mobility during ADLs: Minimal assistance General ADL Comments: HHA for mobility, able to perform LB dressing at EOB and bed level, lays down to don/doff pants    Extremity/Trunk Assessment              Vision       Perception     Praxis  Cognition Arousal: Alert Behavior During Therapy: Impulsive Overall Cognitive Status: History of cognitive impairments - at baseline                                 General  Comments: difficulty problem solving        Exercises      Shoulder Instructions       General Comments      Pertinent Vitals/ Pain       Pain Assessment Pain Assessment: No/denies pain  Home Living                                          Prior Functioning/Environment              Frequency  Min 1X/week        Progress Toward Goals  OT Goals(current goals can now be found in the care plan section)  Progress towards OT goals: Progressing toward goals  Acute Rehab OT Goals Patient Stated Goal: not able to participate in goal setting OT Goal Formulation: Patient unable to participate in goal setting Time For Goal Achievement: 07/12/23 Potential to Achieve Goals: Good ADL Goals Pt Will Perform Lower Body Dressing: with set-up;sit to/from stand Pt Will Transfer to Toilet: with supervision;ambulating;regular height toilet;grab bars Pt Will Perform Toileting - Clothing Manipulation and hygiene: with supervision;sit to/from stand Additional ADL Goal #1: Pt will independently call/request assistance when necessary for toileting to increase bladder/bowl continence and increase ability to manage personal/toilet hygiene.  Plan      Co-evaluation                 AM-PAC OT "6 Clicks" Daily Activity     Outcome Measure   Help from another person eating meals?: None Help from another person taking care of personal grooming?: A Little Help from another person toileting, which includes using toliet, bedpan, or urinal?: A Lot Help from another person bathing (including washing, rinsing, drying)?: A Lot Help from another person to put on and taking off regular upper body clothing?: A Little Help from another person to put on and taking off regular lower body clothing?: A Little 6 Click Score: 17    End of Session Equipment Utilized During Treatment: Gait belt  OT Visit Diagnosis: Unsteadiness on feet (R26.81);Other abnormalities of gait and  mobility (R26.89);Muscle weakness (generalized) (M62.81);Other symptoms and signs involving cognitive function;Pain Pain - Right/Left: Left Pain - part of body: Ankle and joints of foot   Activity Tolerance Patient tolerated treatment well   Patient Left in bed;with call bell/phone within reach   Nurse Communication Mobility status        Time: 2841-3244 OT Time Calculation (min): 20 min  Charges: OT General Charges $OT Visit: 1 Visit OT Treatments $Therapeutic Activity: 8-22 mins  [place], OTR/L   [name] 07/06/2023, 3:44 PM

## 2023-07-06 NOTE — Progress Notes (Signed)
Physical Therapy Treatment Patient Details Name: [name] MRN: [id number] DOB: 07-21-1966 Today's Date: 07/06/2023   History of Present Illness Pt is a 57 year old male admitted for evaluation of being unable to get up and ambulate, and not eating for 2 days, admitted for left foot gangrene with osteomyelitis. 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    PT Comments  Pt worked with Network engineer earlier on ambulation with RW. Focus of session in improving static and dynamic balance. Pt slightly confused about directions, however with interpreter assistance able to perform balance challenge tasks on falls risk mat. Pt also able to work on single step step ups to improve LE strength and improved foot clearance. D/c plan remains appropriate. PT will continue to follow acutely.     If plan is discharge home, recommend the following: A little help with walking and/or transfers;A little help with bathing/dressing/bathroom;Assistance with cooking/housework;Direct supervision/assist for medications management;Assist for transportation;Help with stairs or ramp for entrance   Can travel by private vehicle     No  Equipment Recommendations   (will continue to discern)    Recommendations for Other Services       Precautions / Restrictions Precautions Precautions: Fall;Other (comment) Precaution Comments: History of TBI with left side weakness and cognitive impairments Required Braces or Orthoses: Other Brace Other Brace: L post op shoe Restrictions Weight Bearing Restrictions: No LLE Weight Bearing: Weight bearing as tolerated Other Position/Activity Restrictions: Upgraded to WBAT with postop shoe.     Mobility  Bed Mobility Overal bed mobility: Needs Assistance Bed Mobility: Supine to Sit, Sit to Supine     Supine to sit: Supervision, Used rails Sit to supine: Supervision    General bed mobility comments: cues for taking off shoes before getting in bed    Transfers Overall transfer level: Needs assistance Equipment used: 1 person hand held assist, Rolling walker (2 wheels) Transfers: Sit to/from Stand Sit to Stand: Min assist, Contact guard assist           General transfer comment: pt able to come to standing with use of bed rail, increased posterior lean for stabilization on bed, with HHA able to steady, improved throughout session    Ambulation/Gait Ambulation/Gait assistance: Contact guard assist Gait Distance (Feet): 20 Feet (x2) Assistive device: 1 person hand held assist, Rolling walker (2 wheels) Gait Pattern/deviations: Shuffle, Festinating, Staggering left, Staggering right, Wide base of support Gait velocity: too quick for safety     General Gait Details: CGA with cues for increased step length with short distance ambulation in room   Stairs Stairs: Yes Stairs assistance: Contact guard assist Stair Management: Step to pattern Number of Stairs: 1 (5x step up and back with alternating lead foot) General stair comments: use RW over single step, pt with good R foot power up increased effort with L foot power up       Balance Overall balance assessment: Needs assistance Sitting-balance support: No upper extremity supported, Feet supported Sitting balance-Leahy Scale: Normal     Standing balance support: Single extremity supported, During functional activity Standing balance-Leahy Scale: Poor Standing balance comment: looks for UE support                            Cognition Arousal: Alert Behavior During Therapy: Impulsive Overall Cognitive Status: History of cognitive impairments - at baseline  General Comments: hx of cognitive impairment, impulsive, requires cues for safety and to ask for assistance. needs repeated cues to maintain corrections        Exercises  Other Exercises Other Exercises: standing marches on falls mat for balance challenge with and without UE support on bed rail Other Exercises: static standing balance on falls mat to challenge balance, Other Exercises: reaching outside BoS with static standing on uneven surface with CGA    General Comments  Sister and Interpreter present throughout session.       Pertinent Vitals/Pain Pain Assessment Pain Assessment: No/denies pain     PT Goals (current goals can now be found in the care plan section) Acute Rehab PT Goals Patient Stated Goal: Able to voice need to get to the bathroom PT Goal Formulation: With patient Time For Goal Achievement: 06/24/23 Potential to Achieve Goals: Good Progress towards PT goals: Progressing toward goals    Frequency    Min 1X/week      PT Plan Current plan remains appropriate       AM-PAC PT "6 Clicks" Mobility   Outcome Measure  Help needed turning from your back to your side while in a flat bed without using bedrails?: None Help needed moving from lying on your back to sitting on the side of a flat bed without using bedrails?: None Help needed moving to and from a bed to a chair (including a wheelchair)?: A Little Help needed standing up from a chair using your arms (e.g., wheelchair or bedside chair)?: A Little Help needed to walk in hospital room?: A Lot Help needed climbing 3-5 steps with a railing? : Total 6 Click Score: 17    End of Session Equipment Utilized During Treatment: Gait belt Activity Tolerance: Patient tolerated treatment well Patient left: in bed;with call bell/phone within reach;with bed alarm set Nurse Communication: Mobility status PT Visit Diagnosis: Other abnormalities of gait and mobility (R26.89)     Time: 5462-7035 PT Time Calculation (min) (ACUTE ONLY): 20 min  Charges:    $Therapeutic Activity: 8-22 mins PT General Charges $$ ACUTE PT VISIT: 1 Visit                     Pravin Perezperez B. Beverely Risen  PT, DPT Acute Rehabilitation Services Please use secure chat or  Call Office [phone]    Elon Alas Fleet 07/06/2023, 2:50 PM

## 2023-07-07 LAB — GLUCOSE, CAPILLARY
Glucose-Capillary: 146 mg/dL — ABNORMAL HIGH (ref 70–99)
Glucose-Capillary: 87 mg/dL (ref 70–99)
Glucose-Capillary: 175 mg/dL — ABNORMAL HIGH (ref 70–99)
Glucose-Capillary: 143 mg/dL — ABNORMAL HIGH (ref 70–99)

## 2023-07-07 LAB — BASIC METABOLIC PANEL
Anion gap: 6 (ref 5–15)
BUN: 35 mg/dL — ABNORMAL HIGH (ref 6–20)
CO2: 26 mmol/L (ref 22–32)
Calcium: 8.5 mg/dL — ABNORMAL LOW (ref 8.9–10.3)
Chloride: 104 mmol/L (ref 98–111)
Creatinine, Ser: 0.97 mg/dL (ref 0.61–1.24)
GFR, Estimated: >60 mL/min (ref >60)
Glucose, Bld: 136 mg/dL — ABNORMAL HIGH (ref 70–99)
Potassium: 4.3 mmol/L (ref 3.5–5.1)
Sodium: 136 mmol/L (ref 135–145)

## 2023-07-07 LAB — CBC
HCT: 30.5 % — ABNORMAL LOW (ref 39.0–52.0)
Hemoglobin: 10.3 g/dL — ABNORMAL LOW (ref 13.0–17.0)
MCH: 30 pg (ref 26.0–34.0)
MCHC: 33.8 g/dL (ref 30.0–36.0)
MCV: 88.9 fL (ref 80.0–100.0)
Platelets: 258 10*3/uL (ref 150–400)
RBC: 3.43 MIL/uL — ABNORMAL LOW (ref 4.22–5.81)
RDW: 12.6 % (ref 11.5–15.5)
WBC: 4.5 10*3/uL (ref 4.0–10.5)
nRBC: 0 % (ref 0.0–0.2)

## 2023-07-07 NOTE — Progress Notes (Signed)
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  He underwent left TMA with wound VAC by Dr. Lajoyce Corners 05/05/2023.   Therapy recommended home health but patient has no safe disposition. Per sister's report to [facility], "he was staying with some people because he couldn't stay with sister anymore due to violent behaviors. Sister was bringing food and they were letting him stay for free. And now those people are saying he can't come back and sister can't take him either". TOC filed APS report.  Now a placement issue.    Assessment & Plan:   Principal Problem:   Cellulitis of left foot Active Problems:   Essential hypertension   Type 2 diabetes mellitus with peripheral neuropathy (HCC)   Seizure disorder (HCC)   MDD (major depressive disorder), recurrent episode, moderate (HCC)   History of traumatic brain injury   Cutaneous abscess of left foot   PAD (peripheral artery disease) (HCC)   Severe protein-calorie malnutrition (HCC)   1-Left foot cellulitis with gangrene/osteomyelitis: -MRI with osteo of the second toe and plantar muscle suggesting myositis. -Evaluated by vascular surgery and there was no indication for vascular intervention -S/p left TMA and wound VAC placement by Dr. Lajoyce Corners on 7/31st.  -Recommendation for Out patient PT>   H/o TBI/cognitive impairment and intermittent aggressive behaviors/Depression - Continue risperdal, prozac and hydroxyzine - Psychiatry evaluation appreciated: Patient does not currently have capacity   Unsafe living situation According to sister patient has been living in a filthy, unsafe living situation.  Patient with  TBI/cognitive impairment.  Has no capacity to make medical decision. TOC is following.  APS is also following.  Disposition plan is unclear as yet.   T2DM with hyperglycemia HbA1c 5.8%.  Continue glargine 10u daily and SSI.    Anemia of chronic disease - Monitor intermittently.   Fall in the hospital No apparent injury or acute focal neurodeficit. - Fall precautions   Seizure disorder: - Continue keppra 500mg  BID    Hyponatremia Resolved   Hypertension Patient was on low-dose lisinopril which was discontinued on 8/31 due to borderline low blood pressures.  Blood pressure has stabilized.   Hyperlipidemia - Continue statin      Nutrition Problem: Increased nutrient needs Etiology: acute illness (L foot gangrene with osteomyelitis; wound vac)    Signs/Symptoms: estimated needs    Interventions: Refer to RD note for recommendations, Ensure Enlive (each supplement provides 350kcal and 20 grams of protein), Juven, MVI  Estimated body mass index is 22.02 kg/m as calculated from the following:   Height as of 03/11/23: 5\' 2"  (1.575 m).   Weight as of this encounter: 54.6 kg.   DVT prophylaxis: SCD Code Status: Full code Family Communication: Disposition Plan:  Status is: Inpatient Remains inpatient appropriate because: await safe discharge plan.     Consultants:  Psych Dr Lajoyce Corners.,   Procedures:    Antimicrobials:    Subjective: He is alert, denies pain.  He is calm  Objective: Vitals:   07/06/23 1931 07/07/23 0429 07/07/23 0844 07/07/23 1639  BP: (!) 97/55 116/65 (!) 96/51 105/70  Pulse: 71 75 71 72  Resp: 17 16 17 18   Temp: 97.9 F (36.6 C)  98.6 F (37 C) 98.6  F (37 C)  TempSrc:      SpO2: 96% 99% 99% 97%  Weight:        Intake/Output Summary (Last 24 hours) at 07/07/2023 1708 Last data filed at 07/07/2023 1300 Gross per 24 hour  Intake 480 ml  Output 350 ml  Net 130 ml   Filed Weights   05/26/23 2321  Weight: 54.6 kg     Examination:  General exam: Appears calm and comfortable  Respiratory system: Clear to auscultation. Respiratory effort normal. Cardiovascular system: S1 & S2 heard, RRR. No JVD, murmurs, rubs, gallops or clicks. No pedal edema. Gastrointestinal system: Abdomen is nondistended, soft and nontender. No organomegaly or masses felt. Normal bowel sounds heard. Central nervous system: Alert and oriented. No focal neurological deficits. Extremities: dressing Left LE Data Reviewed: I have personally reviewed following labs and imaging studies  CBC: Recent Labs  Lab 07/07/23 0858  WBC 4.5  HGB 10.3*  HCT 30.5*  MCV 88.9  PLT 258   Basic Metabolic Panel: Recent Labs  Lab 07/07/23 0858  NA 136  K 4.3  CL 104  CO2 26  GLUCOSE 136*  BUN 35*  CREATININE 0.97  CALCIUM 8.5*   GFR: Estimated Creatinine Clearance: 64.9 mL/min (by C-G formula based on SCr of 0.97 mg/dL). Liver Function Tests: No results for input(s): "AST", "ALT", "ALKPHOS", "BILITOT", "PROT", "ALBUMIN" in the last 168 hours. No results for input(s): "LIPASE", "AMYLASE" in the last 168 hours. No results for input(s): "AMMONIA" in the last 168 hours. Coagulation Profile: No results for input(s): "INR", "PROTIME" in the last 168 hours. Cardiac Enzymes: No results for input(s): "CKTOTAL", "CKMB", "CKMBINDEX", "TROPONINI" in the last 168 hours. BNP (last 3 results) No results for input(s): "PROBNP" in the last 8760 hours. HbA1C: No results for input(s): "HGBA1C" in the last 72 hours. CBG: Recent Labs  Lab 07/06/23 1932 07/06/23 2043 07/07/23 0844 07/07/23 1218 07/07/23 1638  GLUCAP 68* 112* 146* 87 175*   Lipid Profile: No results for input(s): "CHOL", "HDL", "LDLCALC", "TRIG", "CHOLHDL", "LDLDIRECT" in the last 72 hours. Thyroid Function Tests: No results for input(s): "TSH", "T4TOTAL", "FREET4", "T3FREE", "THYROIDAB" in the last 72 hours. Anemia Panel: No results for input(s): "VITAMINB12", "FOLATE",  "FERRITIN", "TIBC", "IRON", "RETICCTPCT" in the last 72 hours. Sepsis Labs: No results for input(s): "PROCALCITON", "LATICACIDVEN" in the last 168 hours.  No results found for this or any previous visit (from the past 240 hour(s)).       Radiology Studies: No results found.      Scheduled Meds:  vitamin C  1,000 mg Oral Daily   atorvastatin  10 mg Oral Daily   enoxaparin (LOVENOX) injection  40 mg Subcutaneous Daily   feeding supplement  237 mL Oral BID BM   FLUoxetine  30 mg Oral Daily   insulin aspart  0-5 Units Subcutaneous QHS   insulin aspart  0-9 Units Subcutaneous TID WC   insulin glargine-yfgn  10 Units Subcutaneous QHS   levETIRAcetam  500 mg Oral BID   multivitamins with iron  1 tablet Oral Daily   nutrition supplement (JUVEN)  1 packet Oral BID BM   pantoprazole  40 mg Oral Daily   risperiDONE  0.25 mg Oral QHS   Continuous Infusions:   LOS: 42 days    Time spent: 35 minutes    Kohana Amble A Anilah Huck, MD [facility]   If 7PM-7AM, please contact night-coverage [email or web address]  07/07/2023, 5:08 PM

## 2023-07-07 NOTE — Inpatient Diabetes Management (Signed)
Inpatient Diabetes Program Recommendations  AACE/ADA: New Consensus Statement on Inpatient Glycemic Control (2015)  Target Ranges:  Prepandial:   less than 140 mg/dL      Peak postprandial:   less than 180 mg/dL (1-2 hours)      Critically ill patients:  140 - 180 mg/dL   Lab Results  Component Value Date   GLUCAP 146 (H) 07/07/2023   HGBA1C 5.8 (H) 05/27/2023    Review of Glycemic Control  Latest Reference Range & Units 07/06/23 07:22 07/06/23 11:30 07/06/23 16:21 07/06/23 19:32 07/06/23 20:43 07/07/23 08:44  Glucose-Capillary 70 - 99 mg/dL 144 (H) 818 (H) 563 (H) 68 (L) 112 (H) 146 (H)   Diabetes history:  DM 2 Outpatient Diabetes medications: metformin 1000 mg bid Current orders for Inpatient glycemic control:  Semglee 10 units qhs Novolog 0-9 units tid + hs  A1c 5.8% on 05/27/23  Inpatient Diabetes Program Recommendations:    Note hypoglycemia due to insulin given outside o the 1 hour window for CBG coverage. No changes at this time.  Thanks,  Christena Deem RN, MSN, BC-ADM Inpatient Diabetes Coordinator Team Pager [phone] (8a-5p)

## 2023-07-07 NOTE — Progress Notes (Signed)
Mobility Specialist: Progress Note   07/07/23 1658  Mobility  Activity Ambulated with assistance in hallway  Level of Assistance Contact guard assist, steadying assist  Assistive Device Front wheel walker  Distance Ambulated (ft) 550 ft  LLE Weight Bearing WBAT (with post op boot)  Activity Response Tolerated well  Mobility Referral Yes  $Mobility charge 1 Mobility  Mobility Specialist Start Time (ACUTE ONLY) 1535  Mobility Specialist Stop Time (ACUTE ONLY) 1550  Mobility Specialist Time Calculation (min) (ACUTE ONLY) 15 min    Pt was agreeable to mobility session - received in bed. Interpreter present and helpful throughout session. Gait is continuously improving, pt is responding well to tactile cues more so than verbal. Pt's information retention has improved with consistency - was able to reiterate to MS two instructions that he was given about RW use and taking larger, slower steps. No complaints throughout. Returned to bed without fault. Left in bed with all needs met, call bell in reach. Bed alarm on. RN in room.   [name] Mobility Specialist Please contact via SecureChat or Rehab office at [phone]

## 2023-07-07 NOTE — Plan of Care (Signed)
  Problem: Education: Goal: Ability to describe self-care measures that may prevent or decrease complications (Diabetes Survival Skills Education) will improve Outcome: Progressing   Problem: Coping: Goal: Ability to adjust to condition or change in health will improve Outcome: Progressing   Problem: Health Behavior/Discharge Planning: Goal: Ability to identify and utilize available resources and services will improve Outcome: Progressing   Problem: Metabolic: Goal: Ability to maintain appropriate glucose levels will improve Outcome: Progressing   Problem: Nutritional: Goal: Progress toward achieving an optimal weight will improve Outcome: Progressing   Problem: Skin Integrity: Goal: Risk for impaired skin integrity will decrease Outcome: Progressing   Problem: Tissue Perfusion: Goal: Adequacy of tissue perfusion will improve Outcome: Progressing

## 2023-07-08 LAB — GLUCOSE, CAPILLARY
Glucose-Capillary: 94 mg/dL (ref 70–99)
Glucose-Capillary: 107 mg/dL — ABNORMAL HIGH (ref 70–99)
Glucose-Capillary: 108 mg/dL — ABNORMAL HIGH (ref 70–99)
Glucose-Capillary: 118 mg/dL — ABNORMAL HIGH (ref 70–99)
Glucose-Capillary: 119 mg/dL — ABNORMAL HIGH (ref 70–99)

## 2023-07-08 NOTE — TOC Progression Note (Signed)
Transition of Care [facility]) - Progression Note    Patient Details  Name: [name] MRN: [id number] Date of Birth: 1966-07-05  Transition of Care [facility]) CM/SW Contact  Lorri Frederick, LCSW Phone Number: 07/08/2023, 9:53 AM  Clinical Narrative:  CSW reached out to APS for update.      Expected Discharge Plan: Home w Home Health Services Barriers to Discharge: No Barriers Identified  Expected Discharge Plan and Services       Living arrangements for the past 2 months: Single Family Home                                       Social Determinants of Health (SDOH) Interventions SDOH Screenings   Depression (PHQ2-9): Low Risk  (03/11/2023)  Tobacco Use: Low Risk  (06/02/2023)    Readmission Risk Interventions     No data to display

## 2023-07-08 NOTE — Progress Notes (Signed)
Physical Therapy Treatment Patient Details Name: [name] MRN: [id number] DOB: 09/17/1966 Today's Date: 07/08/2023   History of Present Illness Pt is a 57 year old male admitted for evaluation of being unable to get up and ambulate, and not eating for 2 days, admitted for left foot gangrene with osteomyelitis. 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    PT Comments  Abbreviated session due to interpreter availability. Just worked on ambulation with RW with Network engineer. Focus of session problem solving putting on shoes without laying back in bed while seated EoB. Sister provides guidance, limiting his independent problem solving. Also worked on walking without AD and stair navigation. Pt with improved strength assisting in providing increased stability.  D/c plan remains appropriate. Working towards independent ambulation.    If plan is discharge home, recommend the following: A little help with walking and/or transfers;A little help with bathing/dressing/bathroom;Assistance with cooking/housework;Direct supervision/assist for medications management;Assist for transportation;Help with stairs or ramp for entrance   Can travel by private vehicle     No  Equipment Recommendations   (will continue to discern)       Precautions / Restrictions Precautions Precautions: Fall;Other (comment) Precaution Comments: History of TBI with left side weakness and cognitive impairments Required Braces or Orthoses: Other Brace Other Brace: L post op shoe Restrictions Weight Bearing Restrictions: No LLE Weight Bearing: Weight bearing as tolerated Other Position/Activity Restrictions: Upgraded to WBAT with postop shoe.     Mobility  Bed Mobility Overal bed mobility: Needs Assistance Bed Mobility: Supine to Sit, Sit to Supine     Supine to sit: Supervision Sit to supine: Supervision   General  bed mobility comments: cues for taking off shoes before getting in bed    Transfers Overall transfer level: Needs assistance Equipment used: Rolling walker (2 wheels) Transfers: Sit to/from Stand Sit to Stand: Contact guard assist           General transfer comment: pt able to come to standing in RW with contact guard for safety    Ambulation/Gait Ambulation/Gait assistance: Contact guard assist   Assistive device: 1 person hand held assist, Rolling walker (2 wheels) Gait Pattern/deviations: Shuffle, Festinating, Staggering left, Staggering right, Wide base of support Gait velocity: constant cuing for decreased velocity Gait velocity interpretation: <1.8 ft/sec, indicate of risk for recurrent falls   General Gait Details: initiated ambulation with RW, pt exhibiting improved step length and stability so switched to HHA and then to just contact guard at gait belt.   Stairs Stairs: Yes Stairs assistance: Contact guard assist Stair Management: Step to pattern Number of Stairs: 5 (x2) General stair comments: contact guard with pt use of rails to steady      Balance Overall balance assessment: Needs assistance Sitting-balance support: No upper extremity supported, Feet supported Sitting balance-Leahy Scale: Normal     Standing balance support: Single extremity supported, During functional activity Standing balance-Leahy Scale: Fair Standing balance comment: looks for support but is able to stabilize with both static and dynamic balance at a contact guard level                            Cognition Arousal: Alert Behavior During Therapy: Impulsive Overall Cognitive Status: History of cognitive impairments - at baseline  General Comments: hx of cognitive impairment, impulsive, requires cues for safety and to ask for assistance. needs repeated cues to maintain corrections           General Comments General  comments (skin integrity, edema, etc.): in person spanish interpreter present during session, worked on problem solving putting on shoes at [employer] without increased posterior lean      Pertinent Vitals/Pain Pain Assessment Pain Assessment: No/denies pain     PT Goals (current goals can now be found in the care plan section) Acute Rehab PT Goals PT Goal Formulation: With patient Time For Goal Achievement: 06/24/23 Potential to Achieve Goals: Good Progress towards PT goals: Progressing toward goals    Frequency    Min 1X/week      PT Plan Current plan remains appropriate       AM-PAC PT "6 Clicks" Mobility   Outcome Measure  Help needed turning from your back to your side while in a flat bed without using bedrails?: None Help needed moving from lying on your back to sitting on the side of a flat bed without using bedrails?: None Help needed moving to and from a bed to a chair (including a wheelchair)?: A Little Help needed standing up from a chair using your arms (e.g., wheelchair or bedside chair)?: A Little Help needed to walk in hospital room?: A Little Help needed climbing 3-5 steps with a railing? : A Little 6 Click Score: 20    End of Session Equipment Utilized During Treatment: Gait belt Activity Tolerance: Patient tolerated treatment well Patient left: in bed;with call bell/phone within reach;with bed alarm set Nurse Communication: Mobility status PT Visit Diagnosis: Other abnormalities of gait and mobility (R26.89)     Time: 1610-9604 PT Time Calculation (min) (ACUTE ONLY): 16 min  Charges:    $Gait Training: 8-22 mins PT General Charges $$ ACUTE PT VISIT: 1 Visit                     Kathrynn Backstrom B. Beverely Risen PT, DPT Acute Rehabilitation Services Please use secure chat or  Call Office [phone]    Elon Alas [facility] 07/08/2023, 3:27 PM

## 2023-07-08 NOTE — Plan of Care (Signed)
  Problem: Education: Goal: Ability to describe self-care measures that may prevent or decrease complications (Diabetes Survival Skills Education) will improve Outcome: Progressing   Problem: Coping: Goal: Ability to adjust to condition or change in health will improve Outcome: Progressing   Problem: Health Behavior/Discharge Planning: Goal: Ability to identify and utilize available resources and services will improve Outcome: Progressing   Problem: Nutritional: Goal: Progress toward achieving an optimal weight will improve Outcome: Progressing   Problem: Skin Integrity: Goal: Risk for impaired skin integrity will decrease Outcome: Progressing   Problem: Tissue Perfusion: Goal: Adequacy of tissue perfusion will improve Outcome: Progressing

## 2023-07-08 NOTE — Progress Notes (Signed)
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  He underwent left TMA with wound VAC by Dr. Lajoyce Corners 05/05/2023.   Therapy recommended home health but patient has no safe disposition. Per sister's report to [facility], "he was staying with some people because he couldn't stay with sister anymore due to violent behaviors. Sister was bringing food and they were letting him stay for free. And now those people are saying he can't come back and sister can't take him either". TOC filed APS report.  Now a placement issue.   Stable , awaiting placement. +  Assessment & Plan:   Principal Problem:   Cellulitis of left foot Active Problems:   Essential hypertension   Type 2 diabetes mellitus with peripheral neuropathy (HCC)   Seizure disorder (HCC)   MDD (major depressive disorder), recurrent episode, moderate (HCC)   History of traumatic brain injury   Cutaneous abscess of left foot   PAD (peripheral artery disease) (HCC)   Severe protein-calorie malnutrition (HCC)   1-Left foot cellulitis with gangrene/osteomyelitis: -MRI with osteo of the second toe and plantar muscle suggesting myositis. -Evaluated by vascular surgery and there was no indication for vascular intervention -S/p left TMA and wound VAC placement by Dr. Lajoyce Corners on 7/31st.  -Recommendation for Out patient PT>   H/o TBI/cognitive impairment and intermittent aggressive behaviors/Depression - Continue risperdal, prozac and hydroxyzine - Psychiatry evaluation appreciated: Patient does not currently have capacity   Unsafe living situation According to sister patient has been living in a filthy, unsafe living  situation.  Patient with TBI/cognitive impairment.  Has no capacity to make medical decision. TOC is following.  APS is also following.  Disposition plan is unclear as yet.   T2DM with hyperglycemia HbA1c 5.8%.  Continue glargine 10u daily and SSI.    Anemia of chronic disease - Monitor intermittently.   Fall in the hospital No apparent injury or acute focal neurodeficit. - Fall precautions   Seizure disorder: - Continue keppra 500mg  BID    Hyponatremia Resolved   Hypertension Patient was on low-dose lisinopril which was discontinued on 8/31 due to borderline low blood pressures.  Blood pressure has stabilized.   Hyperlipidemia - Continue statin      Nutrition Problem: Increased nutrient needs Etiology: acute illness (L foot gangrene with osteomyelitis; wound vac)    Signs/Symptoms: estimated needs    Interventions: Refer to RD note for recommendations, Ensure Enlive (each supplement provides 350kcal and 20 grams of protein), Juven, MVI  Estimated body mass index is 22.02 kg/m as calculated from the following:   Height as of 03/11/23: 5\' 2"  (1.575 m).   Weight as of this encounter: 54.6 kg.   DVT prophylaxis: SCD Code Status: Full code Family Communication: Disposition Plan:  Status is: Inpatient Remains inpatient appropriate because: await safe discharge plan.     Consultants:  Psych Dr Lajoyce Corners.,   Procedures:    Antimicrobials:    Subjective: He is calm, no complaints.   Objective: Vitals:   07/07/23 1639 07/07/23 2126 07/08/23 0357 07/08/23 0729  BP: 105/70 122/70 116/70 (!) 91/56  Pulse: 72 71 63 86  Resp: 18 16 17 18   Temp: 98.6 F (37 C) 98.2 F (36.8 C) 98  F (36.7 C) 98 F (36.7 C)  TempSrc:  Oral    SpO2: 97% 100% 97% 98%  Weight:        Intake/Output Summary (Last 24 hours) at 07/08/2023 1544 Last data filed at 07/08/2023 1105 Gross per 24 hour  Intake 480 ml  Output 1600 ml  Net -1120 ml   Filed Weights   05/26/23 2321   Weight: 54.6 kg    Examination:  General exam: NAD Respiratory system: CTA Cardiovascular system: S 1, S 2 RRR Gastrointestinal system: BS present, soft, nt Central nervous system: alert, calm.  Extremities: dressing Left LE  Data Reviewed: I have personally reviewed following labs and imaging studies  CBC: Recent Labs  Lab 07/07/23 0858  WBC 4.5  HGB 10.3*  HCT 30.5*  MCV 88.9  PLT 258   Basic Metabolic Panel: Recent Labs  Lab 07/07/23 0858  NA 136  K 4.3  CL 104  CO2 26  GLUCOSE 136*  BUN 35*  CREATININE 0.97  CALCIUM 8.5*   GFR: Estimated Creatinine Clearance: 64.9 mL/min (by C-G formula based on SCr of 0.97 mg/dL). Liver Function Tests: No results for input(s): "AST", "ALT", "ALKPHOS", "BILITOT", "PROT", "ALBUMIN" in the last 168 hours. No results for input(s): "LIPASE", "AMYLASE" in the last 168 hours. No results for input(s): "AMMONIA" in the last 168 hours. Coagulation Profile: No results for input(s): "INR", "PROTIME" in the last 168 hours. Cardiac Enzymes: No results for input(s): "CKTOTAL", "CKMB", "CKMBINDEX", "TROPONINI" in the last 168 hours. BNP (last 3 results) No results for input(s): "PROBNP" in the last 8760 hours. HbA1C: No results for input(s): "HGBA1C" in the last 72 hours. CBG: Recent Labs  Lab 07/07/23 1218 07/07/23 1638 07/07/23 2147 07/08/23 0724 07/08/23 1202  GLUCAP 87 175* 143* 94 107*   Lipid Profile: No results for input(s): "CHOL", "HDL", "LDLCALC", "TRIG", "CHOLHDL", "LDLDIRECT" in the last 72 hours. Thyroid Function Tests: No results for input(s): "TSH", "T4TOTAL", "FREET4", "T3FREE", "THYROIDAB" in the last 72 hours. Anemia Panel: No results for input(s): "VITAMINB12", "FOLATE", "FERRITIN", "TIBC", "IRON", "RETICCTPCT" in the last 72 hours. Sepsis Labs: No results for input(s): "PROCALCITON", "LATICACIDVEN" in the last 168 hours.  No results found for this or any previous visit (from the past 240 hour(s)).        Radiology Studies: No results found.      Scheduled Meds:  vitamin C  1,000 mg Oral Daily   atorvastatin  10 mg Oral Daily   enoxaparin (LOVENOX) injection  40 mg Subcutaneous Daily   feeding supplement  237 mL Oral BID BM   FLUoxetine  30 mg Oral Daily   insulin aspart  0-5 Units Subcutaneous QHS   insulin aspart  0-9 Units Subcutaneous TID WC   insulin glargine-yfgn  10 Units Subcutaneous QHS   levETIRAcetam  500 mg Oral BID   multivitamins with iron  1 tablet Oral Daily   nutrition supplement (JUVEN)  1 packet Oral BID BM   pantoprazole  40 mg Oral Daily   risperiDONE  0.25 mg Oral QHS   Continuous Infusions:   LOS: 43 days    Time spent: 35 minutes    Kaelen Brennan A Maribell Demeo, MD [facility]   If 7PM-7AM, please contact night-coverage [email or web address]  07/08/2023, 3:44 PM

## 2023-07-08 NOTE — Plan of Care (Signed)
 Problem: Education: Goal: Ability to describe self-care measures that may prevent or decrease complications (Diabetes Survival Skills Education) will improve Outcome: Not Progressing Goal: Individualized Educational Video(s) Outcome: Not Progressing   Problem: Coping: Goal: Ability to adjust to condition or change in health will improve Outcome: Not Progressing   Problem: Health Behavior/Discharge Planning: Goal: Ability to identify and utilize available resources and services will improve Outcome: Not Progressing Goal: Ability to manage health-related needs will improve Outcome: Not Progressing   Problem: Metabolic: Goal: Ability to maintain appropriate glucose levels will improve Outcome: Not Progressing   Problem: Nutritional: Goal: Progress toward achieving an optimal weight will improve Outcome: Not Progressing   Problem: Skin Integrity: Goal: Risk for impaired skin integrity will decrease Outcome: Not Progressing   Problem: Tissue Perfusion: Goal: Adequacy of tissue perfusion will improve Outcome: Not Progressing   Problem: Education: Goal: Knowledge of General Education information will improve Description: Including pain rating scale, medication(s)/side effects and non-pharmacologic comfort measures Outcome: Not Progressing   Problem: Health Behavior/Discharge Planning: Goal: Ability to manage health-related needs will improve Outcome: Not Progressing   Problem: Clinical Measurements: Goal: Ability to maintain clinical measurements within normal limits will improve Outcome: Not Progressing Goal: Will remain free from infection Outcome: Not Progressing Goal: Diagnostic test results will improve Outcome: Not Progressing Goal: Respiratory complications will improve Outcome: Not Progressing Goal: Cardiovascular complication will be avoided Outcome: Not Progressing   Problem: Activity: Goal: Risk for activity intolerance will decrease Outcome: Not  Progressing   Problem: Nutrition: Goal: Adequate nutrition will be maintained Outcome: Not Progressing   Problem: Coping: Goal: Level of anxiety will decrease Outcome: Not Progressing   Problem: Elimination: Goal: Will not experience complications related to bowel motility Outcome: Not Progressing Goal: Will not experience complications related to urinary retention Outcome: Not Progressing   Problem: Pain Managment: Goal: General experience of comfort will improve Outcome: Not Progressing   Problem: Safety: Goal: Ability to remain free from injury will improve Outcome: Not Progressing   Problem: Skin Integrity: Goal: Risk for impaired skin integrity will decrease Outcome: Not Progressing   Problem: Education: Goal: Knowledge of General Education information will improve Description: Including pain rating scale, medication(s)/side effects and non-pharmacologic comfort measures Outcome: Not Progressing   Problem: Health Behavior/Discharge Planning: Goal: Ability to manage health-related needs will improve Outcome: Not Progressing   Problem: Clinical Measurements: Goal: Ability to maintain clinical measurements within normal limits will improve Outcome: Not Progressing Goal: Will remain free from infection Outcome: Not Progressing Goal: Diagnostic test results will improve Outcome: Not Progressing Goal: Respiratory complications will improve Outcome: Not Progressing Goal: Cardiovascular complication will be avoided Outcome: Not Progressing   Problem: Activity: Goal: Risk for activity intolerance will decrease Outcome: Not Progressing   Problem: Nutrition: Goal: Adequate nutrition will be maintained Outcome: Not Progressing   Problem: Coping: Goal: Level of anxiety will decrease Outcome: Not Progressing   Problem: Elimination: Goal: Will not experience complications related to bowel motility Outcome: Not Progressing Goal: Will not experience complications  related to urinary retention Outcome: Not Progressing   Problem: Pain Managment: Goal: General experience of comfort will improve Outcome: Not Progressing   Problem: Safety: Goal: Ability to remain free from injury will improve Outcome: Not Progressing   Problem: Skin Integrity: Goal: Risk for impaired skin integrity will decrease Outcome: Not Progressing   Problem: Education: Goal: Knowledge of the prescribed therapeutic regimen will improve Outcome: Not Progressing Goal: Ability to verbalize activity precautions or restrictions will improve Outcome: Not Progressing Goal:  Understanding of discharge needs will improve Outcome: Not Progressing   Problem: Activity: Goal: Ability to perform//tolerate increased activity and mobilize with assistive devices will improve Outcome: Not Progressing

## 2023-07-08 NOTE — Progress Notes (Signed)
Mobility Specialist: Progress Note   07/08/23 1422  Mobility  Activity Ambulated with assistance in hallway  Level of Assistance Contact guard assist, steadying assist  Assistive Device Front wheel walker  Distance Ambulated (ft) 550 ft  LLE Weight Bearing WBAT (with post op boot)  Activity Response Tolerated well  Mobility Referral Yes  $Mobility charge 1 Mobility  Mobility Specialist Start Time (ACUTE ONLY) 1332  Mobility Specialist Stop Time (ACUTE ONLY) 1348  Mobility Specialist Time Calculation (min) (ACUTE ONLY) 16 min    Pt was agreeable to mobility session - received in bed. Interpreter present and helpful throughout session. Gait is continuously improving, pt is responding well to tactile cues more so than verbal. Pt's information retention has improved with consistency - was able to demonstrate to MS proper RW use. No complaints throughout. Returned to bed without fault. Left in bed with all needs met, call bell in reach. Bed alarm on. PT and interpreter in room.   [name] Mobility Specialist Please contact via SecureChat or Rehab office at [phone]

## 2023-07-08 NOTE — TOC Progression Note (Signed)
Transition of Care [facility]) - Progression Note    Patient Details  Name: [name] MRN: [id number] Date of Birth: [birth year]  Transition [facility]) CM/SW Contact  Lorri Frederick, LCSW Phone Number: 07/08/2023, 9:52 AM  Clinical Narrative:   Email from Angela/APS asking if [facility] will cover first 30 days of pt placement.  Forwarded to Merchandiser, retail.      Expected Discharge Plan: Home w Home Health Services Barriers to Discharge: No Barriers Identified  Expected Discharge Plan and Services       Living arrangements for the past 2 months: Single Family Home                                       Social Determinants of Health (SDOH) Interventions SDOH Screenings   Depression (PHQ2-9): Low Risk  (03/11/2023)  Tobacco Use: Low Risk  (06/02/2023)    Readmission Risk Interventions     No data to display

## 2023-07-09 LAB — GLUCOSE, CAPILLARY
Glucose-Capillary: 81 mg/dL (ref 70–99)
Glucose-Capillary: 90 mg/dL (ref 70–99)
Glucose-Capillary: 109 mg/dL — ABNORMAL HIGH (ref 70–99)
Glucose-Capillary: 117 mg/dL — ABNORMAL HIGH (ref 70–99)
Glucose-Capillary: 126 mg/dL — ABNORMAL HIGH (ref 70–99)

## 2023-07-09 NOTE — Progress Notes (Signed)
Occupational Therapy Treatment Patient Details Name: [name] MRN: [id number] DOB: 03/28/1966 Today's Date: 07/09/2023   History of present illness Pt is a 57 year old male admitted for evaluation of being unable to get up and ambulate, and not eating for 2 days, admitted for left foot gangrene with osteomyelitis. 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   OT comments  Patient making good progress with OT treatment. Patient performed bathing and dressing seated/standing at sink. Patient required frequent cues for sequencing and to stay on tasks with supervision to CGA to perform. Patient's sister present and assisted with interpreting and encouraging patient to perform self care. Discharge recommendations continue to be appropriate. Acute OT to continue to follow.       If plan is discharge home, recommend the following:  A little help with walking and/or transfers;A little help with bathing/dressing/bathroom;Assistance with cooking/housework;Assist for transportation   Equipment Recommendations  Other (comment) (defer)    Recommendations for Other Services      Precautions / Restrictions Precautions Precautions: Fall;Other (comment) Precaution Comments: History of TBI with left side weakness and cognitive impairments Required Braces or Orthoses: Other Brace Other Brace: L post op shoe Restrictions Weight Bearing Restrictions: Yes LLE Weight Bearing: Weight bearing as tolerated Other Position/Activity Restrictions: Upgraded to WBAT with postop shoe.       Mobility Bed Mobility Overal bed mobility: Needs Assistance Bed Mobility: Supine to Sit     Supine to sit: Supervision     General bed mobility comments: cues to initiate    Transfers Overall transfer level: Needs assistance Equipment used: None Transfers: Sit to/from Stand Sit to Stand: Contact guard assist            General transfer comment: CGA to stand and for safety with transfers     Balance Overall balance assessment: Needs assistance Sitting-balance support: No upper extremity supported, Feet supported Sitting balance-Leahy Scale: Normal     Standing balance support: Single extremity supported, During functional activity Standing balance-Leahy Scale: Fair Standing balance comment: used sink for support for self care                           ADL either performed or assessed with clinical judgement   ADL Overall ADL's : Needs assistance/impaired Eating/Feeding: Set up;Sitting Eating/Feeding Details (indicate cue type and reason): in recliner with sister present Grooming: Wash/dry hands;Wash/dry face;Applying deodorant;Oral care;Brushing hair;Supervision/safety;Cueing for sequencing;Standing Grooming Details (indicate cue type and reason): at sink Upper Body Bathing: Supervision/ safety;Cueing for sequencing;Sitting Upper Body Bathing Details (indicate cue type and reason): at sink Lower Body Bathing: Contact guard assist;Cueing for sequencing;Sit to/from stand   Upper Body Dressing : Supervision/safety;Sitting Upper Body Dressing Details (indicate cue type and reason): to donn paper scrub top Lower Body Dressing: Contact guard assist;Sit to/from stand Lower Body Dressing Details (indicate cue type and reason): able to donn sock and shoe seated and CGA to pull up pants for safety Toilet Transfer: Contact guard assist Toilet Transfer Details (indicate cue type and reason): HHA Toileting- Clothing Manipulation and Hygiene: Supervision/safety;Sitting/lateral lean         General ADL Comments: Patient's sister present during self care tasks assisting with providing cues    Extremity/Trunk Assessment              Vision       Perception     Praxis  Cognition Arousal: Alert Behavior During Therapy: Impulsive Overall Cognitive Status: History of  cognitive impairments - at baseline                                 General Comments: cues for sequencing with ADLs        Exercises      Shoulder Instructions       General Comments Patient's sister present to assist with intrepreting    Pertinent Vitals/ Pain       Pain Assessment Pain Assessment: Faces Faces Pain Scale: Hurts a little bit Pain Location: L Foot Pain Descriptors / Indicators: Discomfort Pain Intervention(s): Monitored during session, Repositioned  Home Living                                          Prior Functioning/Environment              Frequency  Min 1X/week        Progress Toward Goals  OT Goals(current goals can now be found in the care plan section)  Progress towards OT goals: Progressing toward goals  Acute Rehab OT Goals OT Goal Formulation: Patient unable to participate in goal setting Time For Goal Achievement: 07/12/23 Potential to Achieve Goals: Good ADL Goals Pt Will Perform Lower Body Dressing: with set-up;sit to/from stand Pt Will Transfer to Toilet: with supervision;ambulating;regular height toilet;grab bars Pt Will Perform Toileting - Clothing Manipulation and hygiene: with supervision;sit to/from stand Additional ADL Goal #1: Pt will independently call/request assistance when necessary for toileting to increase bladder/bowl continence and increase ability to manage personal/toilet hygiene.  Plan      Co-evaluation                 AM-PAC OT "6 Clicks" Daily Activity     Outcome Measure   Help from another person eating meals?: None Help from another person taking care of personal grooming?: A Little Help from another person toileting, which includes using toliet, bedpan, or urinal?: A Little Help from another person bathing (including washing, rinsing, drying)?: A Little Help from another person to put on and taking off regular upper body clothing?: A Little Help from  another person to put on and taking off regular lower body clothing?: A Little 6 Click Score: 19    End of Session Equipment Utilized During Treatment: Gait belt  OT Visit Diagnosis: Unsteadiness on feet (R26.81);Other abnormalities of gait and mobility (R26.89);Muscle weakness (generalized) (M62.81);Other symptoms and signs involving cognitive function;Pain Pain - Right/Left: Left Pain - part of body: Ankle and joints of foot   Activity Tolerance Patient tolerated treatment well   Patient Left in chair;with call bell/phone within reach;with chair alarm set   Nurse Communication Mobility status        Time: 4696-2952 OT Time Calculation (min): 25 min  Charges: OT General Charges $OT Visit: 1 Visit OT Treatments $Self Care/Home Management : 23-37 mins  Alfonse Flavors, OTA Acute Rehabilitation Services  Office [phone]   Dewain Penning 07/09/2023, 2:32 PM

## 2023-07-09 NOTE — Progress Notes (Signed)
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  He underwent left TMA with wound VAC by Dr. Lajoyce Corners 05/05/2023.   Therapy recommended home health but patient has no safe disposition. Per sister's report to [facility], "he was staying with some people because he couldn't stay with sister anymore due to violent behaviors. Sister was bringing food and they were letting him stay for free. And now those people are saying he can't come back and sister can't take him either". TOC filed APS report.  Now a placement issue.   Stable , awaiting placement. +  Assessment & Plan:   Principal Problem:   Cellulitis of left foot Active Problems:   Essential hypertension   Type 2 diabetes mellitus with peripheral neuropathy (HCC)   Seizure disorder (HCC)   MDD (major depressive disorder), recurrent episode, moderate (HCC)   History of traumatic brain injury   Cutaneous abscess of left foot   PAD (peripheral artery disease) (HCC)   Severe protein-calorie malnutrition (HCC)   1-Left foot cellulitis with gangrene/osteomyelitis: -MRI with osteo of the second toe and plantar muscle suggesting myositis. -Evaluated by vascular surgery and there was no indication for vascular intervention -S/p left TMA and wound VAC placement by Dr. Lajoyce Corners on 7/31st.  -Recommendation for Out patient PT>   H/o TBI/cognitive impairment and intermittent aggressive behaviors/Depression - Continue risperdal, prozac and hydroxyzine - Psychiatry evaluation appreciated: Patient does not currently have capacity   Unsafe living situation According to sister patient has been living in a filthy, unsafe living  situation.  Patient with TBI/cognitive impairment.  Has no capacity to make medical decision. TOC is following.  APS is also following.  Disposition plan is unclear as yet.   T2DM with hyperglycemia HbA1c 5.8%.  Continue glargine 10u daily and SSI.    Anemia of chronic disease - Monitor intermittently.   Fall in the hospital No apparent injury or acute focal neurodeficit. - Fall precautions   Seizure disorder: - Continue keppra 500mg  BID    Hyponatremia Resolved   Hypertension Patient was on low-dose lisinopril which was discontinued on 8/31 due to borderline low blood pressures.  Blood pressure has stabilized.   Hyperlipidemia - Continue statin    No changes medical condition    Nutrition Problem: Increased nutrient needs Etiology: acute illness (L foot gangrene with osteomyelitis; wound vac)    Signs/Symptoms: estimated needs    Interventions: Refer to RD note for recommendations, Ensure Enlive (each supplement provides 350kcal and 20 grams of protein), Juven, MVI  Estimated body mass index is 22.02 kg/m as calculated from the following:   Height as of 03/11/23: 5\' 2"  (1.575 m).   Weight as of this encounter: 54.6 kg.   DVT prophylaxis: SCD Code Status: Full code Family Communication: Disposition Plan:  Status is: Inpatient Remains inpatient appropriate because: await safe discharge plan.     Consultants:  Psych Dr Lajoyce Corners.,   Procedures:    Antimicrobials:    Subjective: No complaints, ask for breakfast   Objective: Vitals:   07/08/23 2021 07/09/23 0436 07/09/23 0838 07/09/23 1359  BP: 113/73 108/70 120/73 108/76  Pulse: 69 74 69 84  Resp: 15 14 17 16   Temp: 98.1 F (36.7 C) 98.7  F (37.1 C) 98.1 F (36.7 C) 97.6 F (36.4 C)  TempSrc:    Oral  SpO2: 99% 98% 99% 100%  Weight:        Intake/Output Summary (Last 24 hours) at 07/09/2023 1527 Last data filed at 07/09/2023 1300 Gross per 24 hour  Intake 840 ml  Output 1050 ml  Net -210 ml    Filed Weights   05/26/23 2321  Weight: 54.6 kg    Examination:  General exam: NAD Respiratory system: CTA Cardiovascular system: S1, S 2 RRR Gastrointestinal system: BS present, soft,nt Central nervous system alert Extremities: dressing Left LE  Data Reviewed: I have personally reviewed following labs and imaging studies  CBC: Recent Labs  Lab 07/07/23 0858  WBC 4.5  HGB 10.3*  HCT 30.5*  MCV 88.9  PLT 258   Basic Metabolic Panel: Recent Labs  Lab 07/07/23 0858  NA 136  K 4.3  CL 104  CO2 26  GLUCOSE 136*  BUN 35*  CREATININE 0.97  CALCIUM 8.5*   GFR: Estimated Creatinine Clearance: 64.9 mL/min (by C-G formula based on SCr of 0.97 mg/dL). Liver Function Tests: No results for input(s): "AST", "ALT", "ALKPHOS", "BILITOT", "PROT", "ALBUMIN" in the last 168 hours. No results for input(s): "LIPASE", "AMYLASE" in the last 168 hours. No results for input(s): "AMMONIA" in the last 168 hours. Coagulation Profile: No results for input(s): "INR", "PROTIME" in the last 168 hours. Cardiac Enzymes: No results for input(s): "CKTOTAL", "CKMB", "CKMBINDEX", "TROPONINI" in the last 168 hours. BNP (last 3 results) No results for input(s): "PROBNP" in the last 8760 hours. HbA1C: No results for input(s): "HGBA1C" in the last 72 hours. CBG: Recent Labs  Lab 07/08/23 2150 07/08/23 2156 07/09/23 0558 07/09/23 0813 07/09/23 1112  GLUCAP 119* 118* 81 90 109*   Lipid Profile: No results for input(s): "CHOL", "HDL", "LDLCALC", "TRIG", "CHOLHDL", "LDLDIRECT" in the last 72 hours. Thyroid Function Tests: No results for input(s): "TSH", "T4TOTAL", "FREET4", "T3FREE", "THYROIDAB" in the last 72 hours. Anemia Panel: No results for input(s): "VITAMINB12", "FOLATE", "FERRITIN", "TIBC", "IRON", "RETICCTPCT" in the last 72 hours. Sepsis Labs: No results for input(s): "PROCALCITON", "LATICACIDVEN" in the last 168 hours.  No results found for this or any previous visit (from the  past 240 hour(s)).       Radiology Studies: No results found.      Scheduled Meds:  vitamin C  1,000 mg Oral Daily   atorvastatin  10 mg Oral Daily   enoxaparin (LOVENOX) injection  40 mg Subcutaneous Daily   feeding supplement  237 mL Oral BID BM   FLUoxetine  30 mg Oral Daily   insulin aspart  0-5 Units Subcutaneous QHS   insulin aspart  0-9 Units Subcutaneous TID WC   insulin glargine-yfgn  10 Units Subcutaneous QHS   levETIRAcetam  500 mg Oral BID   multivitamins with iron  1 tablet Oral Daily   nutrition supplement (JUVEN)  1 packet Oral BID BM   pantoprazole  40 mg Oral Daily   risperiDONE  0.25 mg Oral QHS   Continuous Infusions:   LOS: 44 days    Time spent: 35 minutes    Adylynn Hertenstein A Rhona Fusilier, MD [facility]   If 7PM-7AM, please contact night-coverage [email or web address]  07/09/2023, 3:27 PM

## 2023-07-09 NOTE — Progress Notes (Signed)
Mobility Specialist: Progress Note   07/09/23 1601  Mobility  Activity Ambulated with assistance in hallway  Level of Assistance Contact guard assist, steadying assist  Assistive Device Front wheel walker;Other (Comment) (HHA)  Distance Ambulated (ft) 300 ft  LLE Weight Bearing WBAT (with post op boot)  Activity Response Tolerated well  Mobility Referral Yes  $Mobility charge 1 Mobility  Mobility Specialist Start Time (ACUTE ONLY) 1346  Mobility Specialist Stop Time (ACUTE ONLY) 1356  Mobility Specialist Time Calculation (min) (ACUTE ONLY) 10 min    Pt was agreeable to mobility session - received in chair. Ambulated 141ft with RW, 148ft with HHA. Pt gait has improved without RW; took slower, larger steps and is less shaky. Returned to room without fault. Left in chair with all needs met. Call bell in reach, chair alarm on.   [name] Mobility Specialist Please contact via SecureChat or Rehab office at [phone]

## 2023-07-10 LAB — GLUCOSE, CAPILLARY
Glucose-Capillary: 169 mg/dL — ABNORMAL HIGH (ref 70–99)
Glucose-Capillary: 134 mg/dL — ABNORMAL HIGH (ref 70–99)
Glucose-Capillary: 100 mg/dL — ABNORMAL HIGH (ref 70–99)
Glucose-Capillary: 158 mg/dL — ABNORMAL HIGH (ref 70–99)

## 2023-07-10 LAB — VITAMIN B12: Vitamin B-12: 527 pg/mL (ref 180–914)

## 2023-07-10 NOTE — Plan of Care (Signed)
  Problem: Education: Goal: Knowledge of General Education information will improve Description: Including pain rating scale, medication(s)/side effects and non-pharmacologic comfort measures Outcome: Progressing   Problem: Health Behavior/Discharge Planning: Goal: Ability to manage health-related needs will improve Outcome: Progressing   Problem: Activity: Goal: Risk for activity intolerance will decrease Outcome: Progressing   

## 2023-07-10 NOTE — Progress Notes (Signed)
   07/10/23 1446  Mobility  Activity Ambulated with assistance in hallway  Level of Assistance Contact guard assist, steadying assist  Assistive Device Front wheel walker  Distance Ambulated (ft) 475 ft  LLE Weight Bearing WBAT (with post op boot)  Activity Response Tolerated well  Mobility Referral Yes  $Mobility charge 1 Mobility  Mobility Specialist Start Time (ACUTE ONLY) 0214  Mobility Specialist Stop Time (ACUTE ONLY) 0239  Mobility Specialist Time Calculation (min) (ACUTE ONLY) 25 min   Mobility Specialist: Progress Note  During Mobility: HR 88, SpO2 100% RA  Pt agreeable to mobility session - received in bed. Required CG using RW with no complaints. Pt given verbal and tactile cues to stay close to the walker during ambulation. Pt returned to bed with all needs met - call bell within reach. Bed alarm on.   Barnie Mort, BS Mobility Specialist Please contact via SecureChat or Rehab office at [phone].

## 2023-07-10 NOTE — Progress Notes (Signed)
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  He underwent left TMA with wound VAC by Dr. Lajoyce Corners 05/05/2023.   Therapy recommended home health but patient has no safe disposition. Per sister's report to [facility], "he was staying with some people because he couldn't stay with sister anymore due to violent behaviors. Sister was bringing food and they were letting him stay for free. And now those people are saying he can't come back and sister can't take him either". TOC filed APS report.  Now a placement issue.   Stable , awaiting placement. +  Assessment & Plan:   Principal Problem:   Cellulitis of left foot Active Problems:   Essential hypertension   Type 2 diabetes mellitus with peripheral neuropathy (HCC)   Seizure disorder (HCC)   MDD (major depressive disorder), recurrent episode, moderate (HCC)   History of traumatic brain injury   Cutaneous abscess of left foot   PAD (peripheral artery disease) (HCC)   Severe protein-calorie malnutrition (HCC)   1-Left foot cellulitis with gangrene/osteomyelitis: -MRI with osteo of the second toe and plantar muscle suggesting myositis. -Evaluated by vascular surgery and there was no indication for vascular intervention -S/p left TMA and wound VAC placement by Dr. Lajoyce Corners on 7/31st.  -Recommendation for Out patient PT>   H/o TBI/cognitive impairment and intermittent aggressive behaviors/Depression - Continue risperdal, prozac and hydroxyzine - Psychiatry evaluation appreciated: Patient does not currently have capacity  -Check B 12, Thiamine.   Unsafe living situation According to sister patient has been living in a  filthy, unsafe living situation.  Patient with TBI/cognitive impairment.  Has no capacity to make medical decision. TOC is following.  APS is also following.  Disposition plan is unclear as yet.   T2DM with hyperglycemia HbA1c 5.8%.  Continue glargine 10u daily and SSI.    Anemia of chronic disease - Monitor intermittently.   Fall in the hospital No apparent injury or acute focal neurodeficit. - Fall precautions   Seizure disorder: - Continue keppra 500mg  BID    Hyponatremia Resolved   Hypertension Patient was on low-dose lisinopril which was discontinued on 8/31 due to borderline low blood pressures.  Blood pressure has stabilized.   Hyperlipidemia - Continue statin    No changes medical condition    Nutrition Problem: Increased nutrient needs Etiology: acute illness (L foot gangrene with osteomyelitis; wound vac)    Signs/Symptoms: estimated needs    Interventions: Refer to RD note for recommendations, Ensure Enlive (each supplement provides 350kcal and 20 grams of protein), Juven, MVI  Estimated body mass index is 22.02 kg/m as calculated from the following:   Height as of 03/11/23: 5\' 2"  (1.575 m).   Weight as of this encounter: 54.6 kg.   DVT prophylaxis: SCD Code Status: Full code Family Communication: Disposition Plan:  Status is: Inpatient Remains inpatient appropriate because: await safe discharge plan.     Consultants:  Psych Dr Lajoyce Corners.,   Procedures:    Antimicrobials:    Subjective: Alert, asking for food.  He was able to tell me he is from [place]. He used to work at a [employer].   Objective: Vitals:   07/09/23 0838 07/09/23 1359 07/09/23 2130 07/10/23 0726  BP: 120/73  108/76 117/73 107/60  Pulse: 69 84 85 71  Resp: 17 16 18 18   Temp: 98.1 F (36.7 C) 97.6 F (36.4 C) 98.3 F (36.8 C) 97.9 F (36.6 C)  TempSrc:  Oral Oral Oral  SpO2: 99% 100% 100% 99%  Weight:        Intake/Output Summary (Last 24 hours) at 07/10/2023 1410 Last  data filed at 07/10/2023 0981 Gross per 24 hour  Intake 960 ml  Output 2100 ml  Net -1140 ml   Filed Weights   05/26/23 2321  Weight: 54.6 kg    Examination:  General exam: NAD Respiratory system: CTA Cardiovascular system: S 1,  S 2 RRR Gastrointestinal system: BS present, soft nt Central nervous system; alert Extremities: dressing Left LE  Data Reviewed: I have personally reviewed following labs and imaging studies  CBC: Recent Labs  Lab 07/07/23 0858  WBC 4.5  HGB 10.3*  HCT 30.5*  MCV 88.9  PLT 258   Basic Metabolic Panel: Recent Labs  Lab 07/07/23 0858  NA 136  K 4.3  CL 104  CO2 26  GLUCOSE 136*  BUN 35*  CREATININE 0.97  CALCIUM 8.5*   GFR: Estimated Creatinine Clearance: 64.9 mL/min (by C-G formula based on SCr of 0.97 mg/dL). Liver Function Tests: No results for input(s): "AST", "ALT", "ALKPHOS", "BILITOT", "PROT", "ALBUMIN" in the last 168 hours. No results for input(s): "LIPASE", "AMYLASE" in the last 168 hours. No results for input(s): "AMMONIA" in the last 168 hours. Coagulation Profile: No results for input(s): "INR", "PROTIME" in the last 168 hours. Cardiac Enzymes: No results for input(s): "CKTOTAL", "CKMB", "CKMBINDEX", "TROPONINI" in the last 168 hours. BNP (last 3 results) No results for input(s): "PROBNP" in the last 8760 hours. HbA1C: No results for input(s): "HGBA1C" in the last 72 hours. CBG: Recent Labs  Lab 07/09/23 1112 07/09/23 1714 07/09/23 2005 07/10/23 0722 07/10/23 1229  GLUCAP 109* 117* 126* 169* 134*   Lipid Profile: No results for input(s): "CHOL", "HDL", "LDLCALC", "TRIG", "CHOLHDL", "LDLDIRECT" in the last 72 hours. Thyroid Function Tests: No results for input(s): "TSH", "T4TOTAL", "FREET4", "T3FREE", "THYROIDAB" in the last 72 hours. Anemia Panel: No results for input(s): "VITAMINB12", "FOLATE", "FERRITIN", "TIBC", "IRON", "RETICCTPCT" in the last 72 hours. Sepsis Labs: No results for input(s):  "PROCALCITON", "LATICACIDVEN" in the last 168 hours.  No results found for this or any previous visit (from the past 240 hour(s)).       Radiology Studies: No results found.      Scheduled Meds:  vitamin C  1,000 mg Oral Daily   atorvastatin  10 mg Oral Daily   enoxaparin (LOVENOX) injection  40 mg Subcutaneous Daily   feeding supplement  237 mL Oral BID BM   FLUoxetine  30 mg Oral Daily   insulin aspart  0-5 Units Subcutaneous QHS   insulin aspart  0-9 Units Subcutaneous TID WC   insulin glargine-yfgn  10 Units Subcutaneous QHS   levETIRAcetam  500 mg Oral BID   multivitamins with iron  1 tablet Oral Daily   nutrition supplement (JUVEN)  1 packet Oral BID BM   pantoprazole  40 mg Oral Daily   risperiDONE  0.25 mg Oral QHS   Continuous Infusions:   LOS: 45 days    Time spent: 35 minutes    Jamie Legrand A Kyzer Blowe, MD [facility]   If 7PM-7AM, please contact night-coverage [email or web address]  07/10/2023, 2:10 PM

## 2023-07-10 NOTE — Plan of Care (Signed)
Problem: Education: Goal: Ability to describe self-care measures that may prevent or decrease complications (Diabetes Survival Skills Education) will improve Outcome: Not Progressing Goal: Individualized Educational Video(s) Outcome: Not Progressing   Problem: Coping: Goal: Ability to adjust to condition or change in health will improve Outcome: Not Progressing   Problem: Health Behavior/Discharge Planning: Goal: Ability to identify and utilize available resources and services will improve Outcome: Not Progressing Goal: Ability to manage health-related needs will improve Outcome: Not Progressing   Problem: Metabolic: Goal: Ability to maintain appropriate glucose levels will improve Outcome: Not Progressing   Problem: Nutritional: Goal: Progress toward achieving an optimal weight will improve Outcome: Not Progressing   Problem: Skin Integrity: Goal: Risk for impaired skin integrity will decrease Outcome: Not Progressing   Problem: Tissue Perfusion: Goal: Adequacy of tissue perfusion will improve Outcome: Not Progressing   Problem: Education: Goal: Knowledge of General Education information will improve Description: Including pain rating scale, medication(s)/side effects and non-pharmacologic comfort measures Outcome: Not Progressing   Problem: Health Behavior/Discharge Planning: Goal: Ability to manage health-related needs will improve Outcome: Not Progressing   Problem: Clinical Measurements: Goal: Ability to maintain clinical measurements within normal limits will improve Outcome: Not Progressing Goal: Will remain free from infection Outcome: Not Progressing Goal: Diagnostic test results will improve Outcome: Not Progressing Goal: Respiratory complications will improve Outcome: Not Progressing Goal: Cardiovascular complication will be avoided Outcome: Not Progressing   Problem: Activity: Goal: Risk for activity intolerance will decrease Outcome: Not  Progressing   Problem: Nutrition: Goal: Adequate nutrition will be maintained Outcome: Not Progressing   Problem: Coping: Goal: Level of anxiety will decrease Outcome: Not Progressing   Problem: Elimination: Goal: Will not experience complications related to bowel motility Outcome: Not Progressing Goal: Will not experience complications related to urinary retention Outcome: Not Progressing   Problem: Pain Managment: Goal: General experience of comfort will improve Outcome: Not Progressing   Problem: Safety: Goal: Ability to remain free from injury will improve Outcome: Not Progressing   Problem: Skin Integrity: Goal: Risk for impaired skin integrity will decrease Outcome: Not Progressing   Problem: Education: Goal: Knowledge of General Education information will improve Description: Including pain rating scale, medication(s)/side effects and non-pharmacologic comfort measures Outcome: Not Progressing   Problem: Health Behavior/Discharge Planning: Goal: Ability to manage health-related needs will improve Outcome: Not Progressing   Problem: Clinical Measurements: Goal: Ability to maintain clinical measurements within normal limits will improve Outcome: Not Progressing Goal: Will remain free from infection Outcome: Not Progressing Goal: Diagnostic test results will improve Outcome: Not Progressing Goal: Respiratory complications will improve Outcome: Not Progressing Goal: Cardiovascular complication will be avoided Outcome: Not Progressing   Problem: Activity: Goal: Risk for activity intolerance will decrease Outcome: Not Progressing   Problem: Nutrition: Goal: Adequate nutrition will be maintained Outcome: Not Progressing   Problem: Coping: Goal: Level of anxiety will decrease Outcome: Not Progressing   Problem: Elimination: Goal: Will not experience complications related to bowel motility Outcome: Not Progressing Goal: Will not experience complications  related to urinary retention Outcome: Not Progressing   Problem: Pain Managment: Goal: General experience of comfort will improve Outcome: Not Progressing   Problem: Safety: Goal: Ability to remain free from injury will improve Outcome: Not Progressing   Problem: Skin Integrity: Goal: Risk for impaired skin integrity will decrease Outcome: Not Progressing   Problem: Education: Goal: Knowledge of the prescribed therapeutic regimen will improve Outcome: Not Progressing Goal: Ability to verbalize activity precautions or restrictions will improve Outcome: Not Progressing Goal:  Understanding of discharge needs will improve Outcome: Not Progressing   Problem: Activity: Goal: Ability to perform//tolerate increased activity and mobilize with assistive devices will improve Outcome: Not Progressing

## 2023-07-11 LAB — GLUCOSE, CAPILLARY
Glucose-Capillary: 119 mg/dL — ABNORMAL HIGH (ref 70–99)
Glucose-Capillary: 112 mg/dL — ABNORMAL HIGH (ref 70–99)
Glucose-Capillary: 125 mg/dL — ABNORMAL HIGH (ref 70–99)
Glucose-Capillary: 101 mg/dL — ABNORMAL HIGH (ref 70–99)

## 2023-07-11 NOTE — Progress Notes (Addendum)
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  He underwent left TMA with wound VAC by Dr. Lajoyce Corners 05/05/2023.   Therapy recommended home health but patient has no safe disposition. Per sister's report to [facility], "he was staying with some people because he couldn't stay with sister anymore due to violent behaviors. Sister was bringing food and they were letting him stay for free. And now those people are saying he can't come back and sister can't take him either". TOC filed APS report.  Now a placement issue.   Stable , awaiting placement. +  Assessment & Plan:   Principal Problem:   Cellulitis of left foot Active Problems:   Essential hypertension   Type 2 diabetes mellitus with peripheral neuropathy (HCC)   Seizure disorder (HCC)   MDD (major depressive disorder), recurrent episode, moderate (HCC)   History of traumatic brain injury   Cutaneous abscess of left foot   PAD (peripheral artery disease) (HCC)   Severe protein-calorie malnutrition (HCC)   1-Left foot cellulitis with gangrene/osteomyelitis: -MRI with osteo of the second toe and plantar muscle suggesting myositis. -Evaluated by vascular surgery and there was no indication for vascular intervention -S/p left TMA and wound VAC placement by Dr. Lajoyce Corners on 7/31st.  -Recommendation for Out patient PT>  Will ask Dr Lajoyce Corners to follow up on patient.   H/o TBI/cognitive impairment and intermittent aggressive behaviors/Depression - Continue risperdal, prozac and hydroxyzine - Psychiatry evaluation appreciated: Patient does not currently have capacity  -B 12: 527, Thiamine pending.   Unsafe living  situation According to sister patient has been living in a filthy, unsafe living situation.  Patient with TBI/cognitive impairment.  Has no capacity to make medical decision. TOC is following.  APS is also following.  Disposition plan is unclear as yet.   T2DM with hyperglycemia HbA1c 5.8%.  Continue glargine 10u daily and SSI.    Anemia of chronic disease - Monitor intermittently.   Fall in the hospital No apparent injury or acute focal neurodeficit. - Fall precautions   Seizure disorder: - Continue keppra 500mg  BID    Hyponatremia Resolved   Hypertension Patient was on low-dose lisinopril which was discontinued on 8/31 due to borderline low blood pressures.  Blood pressure has stabilized.   Hyperlipidemia - Continue statin    No changes medical condition    Nutrition Problem: Increased nutrient needs Etiology: acute illness (L foot gangrene with osteomyelitis; wound vac)    Signs/Symptoms: estimated needs    Interventions: Refer to RD note for recommendations, Ensure Enlive (each supplement provides 350kcal and 20 grams of protein), Juven, MVI  Estimated body mass index is 22.02 kg/m as calculated from the following:   Height as of 03/11/23: 5\' 2"  (1.575 m).   Weight as of this encounter: 54.6 kg.   DVT prophylaxis: SCD Code Status: Full code Family Communication: Disposition Plan:  Status is: Inpatient Remains inpatient appropriate because: await safe discharge plan.     Consultants:  Psych Dr Lajoyce Corners.,   Procedures:    Antimicrobials:    Subjective: No new concern  Objective: Vitals:   07/10/23 0726 07/10/23 2200 07/11/23 0841 07/11/23 1310  BP: 107/60 123/70 115/74 (!) 90/53  Pulse: 71 76 81  75  Resp: 18 16 18 18   Temp: 97.9 F (36.6 C) 98 F (36.7 C) 98 F (36.7 C) 98.5 F (36.9 C)  TempSrc: Oral Oral    SpO2: 99% 96% 99% 98%  Weight:        Intake/Output Summary (Last 24 hours) at 07/11/2023 1430 Last data filed at 07/11/2023  3875 Gross per 24 hour  Intake 480 ml  Output 1125 ml  Net -645 ml   Filed Weights   05/26/23 2321  Weight: 54.6 kg    Examination:  General exam: NAD Respiratory system: CTA Cardiovascular system: S 1, S 2 RRR Gastrointestinal system: BS present, soft, nt Central nervous system; alert Extremities: dressing Left LE  Data Reviewed: I have personally reviewed following labs and imaging studies  CBC: Recent Labs  Lab 07/07/23 0858  WBC 4.5  HGB 10.3*  HCT 30.5*  MCV 88.9  PLT 258   Basic Metabolic Panel: Recent Labs  Lab 07/07/23 0858  NA 136  K 4.3  CL 104  CO2 26  GLUCOSE 136*  BUN 35*  CREATININE 0.97  CALCIUM 8.5*   GFR: Estimated Creatinine Clearance: 64.9 mL/min (by C-G formula based on SCr of 0.97 mg/dL). Liver Function Tests: No results for input(s): "AST", "ALT", "ALKPHOS", "BILITOT", "PROT", "ALBUMIN" in the last 168 hours. No results for input(s): "LIPASE", "AMYLASE" in the last 168 hours. No results for input(s): "AMMONIA" in the last 168 hours. Coagulation Profile: No results for input(s): "INR", "PROTIME" in the last 168 hours. Cardiac Enzymes: No results for input(s): "CKTOTAL", "CKMB", "CKMBINDEX", "TROPONINI" in the last 168 hours. BNP (last 3 results) No results for input(s): "PROBNP" in the last 8760 hours. HbA1C: No results for input(s): "HGBA1C" in the last 72 hours. CBG: Recent Labs  Lab 07/10/23 1229 07/10/23 1640 07/10/23 2158 07/11/23 0828 07/11/23 1127  GLUCAP 134* 100* 158* 119* 112*   Lipid Profile: No results for input(s): "CHOL", "HDL", "LDLCALC", "TRIG", "CHOLHDL", "LDLDIRECT" in the last 72 hours. Thyroid Function Tests: No results for input(s): "TSH", "T4TOTAL", "FREET4", "T3FREE", "THYROIDAB" in the last 72 hours. Anemia Panel: Recent Labs    07/10/23 1333  VITAMINB12 527   Sepsis Labs: No results for input(s): "PROCALCITON", "LATICACIDVEN" in the last 168 hours.  No results found for this or any previous  visit (from the past 240 hour(s)).       Radiology Studies: No results found.      Scheduled Meds:  vitamin C  1,000 mg Oral Daily   atorvastatin  10 mg Oral Daily   enoxaparin (LOVENOX) injection  40 mg Subcutaneous Daily   feeding supplement  237 mL Oral BID BM   FLUoxetine  30 mg Oral Daily   insulin aspart  0-5 Units Subcutaneous QHS   insulin aspart  0-9 Units Subcutaneous TID WC   insulin glargine-yfgn  10 Units Subcutaneous QHS   levETIRAcetam  500 mg Oral BID   multivitamins with iron  1 tablet Oral Daily   nutrition supplement (JUVEN)  1 packet Oral BID BM   pantoprazole  40 mg Oral Daily   risperiDONE  0.25 mg Oral QHS   Continuous Infusions:   LOS: 46 days    Time spent: 35 minutes    Artem Bunte A Shiv Shuey, MD [facility]   If 7PM-7AM, please contact night-coverage [email or web address]  07/11/2023, 2:30 PM

## 2023-07-12 LAB — GLUCOSE, CAPILLARY
Glucose-Capillary: 145 mg/dL — ABNORMAL HIGH (ref 70–99)
Glucose-Capillary: 169 mg/dL — ABNORMAL HIGH (ref 70–99)
Glucose-Capillary: 135 mg/dL — ABNORMAL HIGH (ref 70–99)
Glucose-Capillary: 140 mg/dL — ABNORMAL HIGH (ref 70–99)

## 2023-07-12 NOTE — Progress Notes (Signed)
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  He underwent left TMA with wound VAC by Dr. Lajoyce Corners 05/05/2023.   Therapy recommended home health but patient has no safe disposition. Per sister's report to [facility], "he was staying with some people because he couldn't stay with sister anymore due to violent behaviors. Sister was bringing food and they were letting him stay for free. And now those people are saying he can't come back and sister can't take him either". TOC filed APS report.  Now a placement issue.   Stable , awaiting placement. +  Assessment & Plan:   Principal Problem:   Cellulitis of left foot Active Problems:   Essential hypertension   Type 2 diabetes mellitus with peripheral neuropathy (HCC)   Seizure disorder (HCC)   MDD (major depressive disorder), recurrent episode, moderate (HCC)   History of traumatic brain injury   Cutaneous abscess of left foot   PAD (peripheral artery disease) (HCC)   Severe protein-calorie malnutrition (HCC)   1-Left Foot Cellulitis with gangrene/osteomyelitis: -MRI with osteo of the second toe and plantar muscle suggesting myositis. -Evaluated by vascular surgery and there was no indication for vascular intervention -S/p left TMA and wound VAC placement by Dr. Lajoyce Corners on 7/31st.  -Recommendation for Out patient PT>  -Dr Lajoyce Corners will place order to remove suture. 9/09  H/o TBI/cognitive impairment and intermittent aggressive behaviors/Depression - Continue risperdal, prozac and hydroxyzine - Psychiatry evaluation appreciated: Patient does not currently have capacity  -B 12: 527, Thiamine pending.   Unsafe living  situation According to sister patient has been living in a filthy, unsafe living situation.  Patient with TBI/cognitive impairment.  Has no capacity to make medical decision. TOC is following.  APS is also following.  Disposition plan is unclear as yet.   T2DM with hyperglycemia HbA1c 5.8%.  Continue glargine 10u daily and SSI.    Anemia of chronic disease - Monitor intermittently.   Fall in the hospital No apparent injury or acute focal neurodeficit. - Fall precautions   Seizure disorder: - Continue keppra 500mg  BID    Hyponatremia Resolved   Hypertension Patient was on low-dose lisinopril which was discontinued on 8/31 due to borderline low blood pressures.  Blood pressure has stabilized.   Hyperlipidemia - Continue statin    No changes medical condition    Nutrition Problem: Increased nutrient needs Etiology: acute illness (L foot gangrene with osteomyelitis; wound vac)    Signs/Symptoms: estimated needs    Interventions: Refer to RD note for recommendations, Ensure Enlive (each supplement provides 350kcal and 20 grams of protein), Juven, MVI  Estimated body mass index is 22.02 kg/m as calculated from the following:   Height as of 03/11/23: 5\' 2"  (1.575 m).   Weight as of this encounter: 54.6 kg.   DVT prophylaxis: SCD Code Status: Full code Family Communication: Disposition Plan:  Status is: Inpatient Remains inpatient appropriate because: await safe discharge plan. Medical stable.     Consultants:  Psych Dr Lajoyce Corners.,   Procedures:    Antimicrobials:    Subjective: Alert, denies pain,. No new complaints.   Objective: Vitals:   07/11/23 1310 07/11/23 1937 07/12/23 0401 07/12/23 0819  BP: (!) 90/53 (!) 123/101 114/74  111/71  Pulse: 75 86 80 77  Resp: 18 16 16 17   Temp: 98.5 F (36.9 C)   98.6 F (37 C)  TempSrc:    Oral  SpO2: 98% 98% 98% 96%  Weight:        Intake/Output Summary (Last 24 hours) at 07/12/2023 1334 Last data filed at  07/12/2023 4098 Gross per 24 hour  Intake --  Output 1950 ml  Net -1950 ml   Filed Weights   05/26/23 2321  Weight: 54.6 kg    Examination:  General exam: NAD Respiratory system: CTA Cardiovascular system: S 1, S 2 RRR Gastrointestinal system: BS present, soft nt Central nervous system; Alert.  Extremities: dressing Left LE  Data Reviewed: I have personally reviewed following labs and imaging studies  CBC: Recent Labs  Lab 07/07/23 0858  WBC 4.5  HGB 10.3*  HCT 30.5*  MCV 88.9  PLT 258   Basic Metabolic Panel: Recent Labs  Lab 07/07/23 0858  NA 136  K 4.3  CL 104  CO2 26  GLUCOSE 136*  BUN 35*  CREATININE 0.97  CALCIUM 8.5*   GFR: Estimated Creatinine Clearance: 64.9 mL/min (by C-G formula based on SCr of 0.97 mg/dL). Liver Function Tests: No results for input(s): "AST", "ALT", "ALKPHOS", "BILITOT", "PROT", "ALBUMIN" in the last 168 hours. No results for input(s): "LIPASE", "AMYLASE" in the last 168 hours. No results for input(s): "AMMONIA" in the last 168 hours. Coagulation Profile: No results for input(s): "INR", "PROTIME" in the last 168 hours. Cardiac Enzymes: No results for input(s): "CKTOTAL", "CKMB", "CKMBINDEX", "TROPONINI" in the last 168 hours. BNP (last 3 results) No results for input(s): "PROBNP" in the last 8760 hours. HbA1C: No results for input(s): "HGBA1C" in the last 72 hours. CBG: Recent Labs  Lab 07/11/23 1127 07/11/23 1620 07/11/23 2117 07/12/23 0748 07/12/23 1112  GLUCAP 112* 125* 101* 145* 169*   Lipid Profile: No results for input(s): "CHOL", "HDL", "LDLCALC", "TRIG", "CHOLHDL", "LDLDIRECT" in the last 72 hours. Thyroid Function Tests: No results for input(s): "TSH", "T4TOTAL", "FREET4", "T3FREE", "THYROIDAB" in the last 72 hours. Anemia Panel: Recent Labs    07/10/23 1333  VITAMINB12 527   Sepsis Labs: No results for input(s): "PROCALCITON", "LATICACIDVEN" in the last 168 hours.  No results found for this or any  previous visit (from the past 240 hour(s)).       Radiology Studies: No results found.      Scheduled Meds:  vitamin C  1,000 mg Oral Daily   atorvastatin  10 mg Oral Daily   enoxaparin (LOVENOX) injection  40 mg Subcutaneous Daily   feeding supplement  237 mL Oral BID BM   FLUoxetine  30 mg Oral Daily   insulin aspart  0-5 Units Subcutaneous QHS   insulin aspart  0-9 Units Subcutaneous TID WC   insulin glargine-yfgn  10 Units Subcutaneous QHS   levETIRAcetam  500 mg Oral BID   multivitamins with iron  1 tablet Oral Daily   nutrition supplement (JUVEN)  1 packet Oral BID BM   pantoprazole  40 mg Oral Daily   risperiDONE  0.25 mg Oral QHS   Continuous Infusions:   LOS: 47 days    Time spent: 35 minutes    Oliana Gowens A Konica Stankowski, MD [facility]   If 7PM-7AM, please contact night-coverage [email or web address]  07/12/2023, 1:34 PM

## 2023-07-12 NOTE — TOC Progression Note (Addendum)
Transition of Care [facility]) - Progression Note    Patient Details  Name: [name] MRN: [id number] Date of Birth: 1966/05/17  [facility] CM/SW Contact  Lorri Frederick, LCSW Phone Number: 07/12/2023, 10:15 AM  Clinical Narrative:   Email from Angela/APS asking if pt has been served for court hearing tomorrow.  CSW could not find any paperwork on the chart, RN not aware of any service.  Marylene Land also asking about [facility] paying for initial 30 days of placement.  TOC supervisor aware and working on this.  Response sent to [facility] by email.  1030: Per Macario Golds, [facility] will cover no more than 30 days of placement.  Angela/APS informed.    Expected Discharge Plan: Home w Home Health Services Barriers to Discharge: No Barriers Identified  Expected Discharge Plan and Services       Living arrangements for the past 2 months: Single Family Home                                       Social Determinants of Health (SDOH) Interventions SDOH Screenings   Depression (PHQ2-9): Low Risk  (03/11/2023)  Tobacco Use: Low Risk  (06/02/2023)    Readmission Risk Interventions     No data to display

## 2023-07-12 NOTE — Progress Notes (Signed)
Mobility Specialist: Progress Note   07/12/23 1636  Mobility  Activity Ambulated with assistance in hallway  Level of Assistance Contact guard assist, steadying assist  Assistive Device Other (Comment) (HHA)  Distance Ambulated (ft) 300 ft  LLE Weight Bearing WBAT (with post op boot)  Activity Response Tolerated well  Mobility Referral Yes  $Mobility charge 1 Mobility  Mobility Specialist Start Time (ACUTE ONLY) 1327  Mobility Specialist Stop Time (ACUTE ONLY) 1340  Mobility Specialist Time Calculation (min) (ACUTE ONLY) 13 min    Pt was agreeable to mobility session - received in bed. Required less verbal cues compared to the last time pt ambulated without RW. Pt took fast, shuffle steps occasionally but overall has improved gait and posture during ambulation. Returned to room and ambulated to bathroom without fault. Left in bed with all needs met, call bell in reach. Bed alarm on.   [name] Mobility Specialist Please contact via SecureChat or Rehab office at [phone]

## 2023-07-12 NOTE — Plan of Care (Addendum)
Sutures removes, no drainage, patient tolerated it well, no pain meds needed, clean dressing applied.   Problem: Education: Goal: Ability to describe self-care measures that may prevent or decrease complications (Diabetes Survival Skills Education) will improve Outcome: Progressing   Problem: Skin Integrity: Goal: Risk for impaired skin integrity will decrease Outcome: Progressing   Problem: Education: Goal: Knowledge of General Education information will improve Description: Including pain rating scale, medication(s)/side effects and non-pharmacologic comfort measures Outcome: Progressing   Problem: Activity: Goal: Risk for activity intolerance will decrease Outcome: Progressing   Problem: Safety: Goal: Ability to remain free from injury will improve Outcome: Progressing   Problem: Skin Integrity: Goal: Risk for impaired skin integrity will decrease Outcome: Progressing   Problem: Education: Goal: Knowledge of General Education information will improve Description: Including pain rating scale, medication(s)/side effects and non-pharmacologic comfort measures Outcome: Progressing

## 2023-07-13 LAB — GLUCOSE, CAPILLARY
Glucose-Capillary: 144 mg/dL — ABNORMAL HIGH (ref 70–99)
Glucose-Capillary: 114 mg/dL — ABNORMAL HIGH (ref 70–99)
Glucose-Capillary: 95 mg/dL (ref 70–99)
Glucose-Capillary: 189 mg/dL — ABNORMAL HIGH (ref 70–99)

## 2023-07-13 LAB — VITAMIN B1: Vitamin B1 (Thiamine): 133.7 nmol/L (ref 66.5–200.0)

## 2023-07-13 NOTE — Progress Notes (Signed)
Physical Therapy Treatment Patient Details Name: [name] MRN: [id number] DOB: 07/17/1966 Today's Date: 07/13/2023   History of Present Illness Pt is a 57 year old male admitted for evaluation of being unable to get up and ambulate, and not eating for 2 days, admitted for left foot gangrene with osteomyelitis. 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    PT Comments  With help of on site interpreter, worked with pt on improving independence. Asked pt to put on his shoes which were on the floor at the foot of the bed. Previously, pt had donned shoes sitting at EoB. Today pt reached over side and put shoes on in the bed. PT provided cuing for improved fit of post op shoe with tightening velcro. Pt requesting HHA and started ambulation with PT after about 25 feet had pt let go of my hand and walk with contact guard assist. Overall, pt stability and velocity have improved with time. Continue to work towards supervision level without AD.     If plan is discharge home, recommend the following: A little help with walking and/or transfers;A little help with bathing/dressing/bathroom;Assistance with cooking/housework;Direct supervision/assist for medications management;Assist for transportation;Help with stairs or ramp for entrance   Can travel by private vehicle     No  Equipment Recommendations   (will continue to discern)    Recommendations for Other Services       Precautions / Restrictions Precautions Precautions: Fall;Other (comment) Precaution Comments: History of TBI with left side weakness and cognitive impairments Required Braces or Orthoses: Other Brace Other Brace: L post op shoe Restrictions Weight Bearing Restrictions: No LLE Weight Bearing: Weight bearing as tolerated Other Position/Activity Restrictions: Upgraded to WBAT with postop shoe.     Mobility  Bed Mobility Overal  bed mobility: Needs Assistance Bed Mobility: Supine to Sit, Sit to Supine     Supine to sit: Supervision     General bed mobility comments: instructed pt to put shoes on sitting on EoB, however pt reached from bed and brought them into the bed and donned them there, afterward supervision to come to EoB    Transfers Overall transfer level: Needs assistance Equipment used: Rolling walker (2 wheels) Transfers: Sit to/from Stand Sit to Stand: Contact guard assist           General transfer comment: pt uses bracing of back of LE on bed to gain stability    Ambulation/Gait Ambulation/Gait assistance: Contact guard assist Gait Distance (Feet): 500 Feet Assistive device: 1 person hand held assist Gait Pattern/deviations: Shuffle, Staggering left, Staggering right, Wide base of support Gait velocity: only occasional cuing for decreased velocity     General Gait Details: continue to work towards independent ambulation starting with HHA and moving to light steadying with gait belt         Balance Overall balance assessment: Needs assistance Sitting-balance support: No upper extremity supported, Feet supported Sitting balance-Leahy Scale: Normal     Standing balance support: Single extremity supported, During functional activity Standing balance-Leahy Scale: Fair Standing balance comment: looks for support but is able to stabilize with both static and dynamic balance at a contact guard level                            Cognition Arousal: Alert Behavior During Therapy: Impulsive Overall Cognitive Status: History of cognitive impairments - at baseline  General Comments: hx of cognitive impairment, impulsive, requires cues for safety and to ask for assistance. needs repeated cues to maintain corrections        Exercises Other Exercises Other Exercises: backwards walking with use of hand rail in hallway Other  Exercises: sidestepping L and R with UE support on hand rail    General Comments General comments (skin integrity, edema, etc.): Interpreter Loistine Simas [phone]      Pertinent Vitals/Pain Pain Assessment Pain Assessment: No/denies pain     PT Goals (current goals can now be found in the care plan section) Acute Rehab PT Goals PT Goal Formulation: With patient Time For Goal Achievement: 06/24/23 Potential to Achieve Goals: Good Progress towards PT goals: Progressing toward goals    Frequency    Min 1X/week      PT Plan Current plan remains appropriate       AM-PAC PT "6 Clicks" Mobility   Outcome Measure  Help needed turning from your back to your side while in a flat bed without using bedrails?: None Help needed moving from lying on your back to sitting on the side of a flat bed without using bedrails?: None Help needed moving to and from a bed to a chair (including a wheelchair)?: A Little Help needed standing up from a chair using your arms (e.g., wheelchair or bedside chair)?: A Little Help needed to walk in hospital room?: A Little Help needed climbing 3-5 steps with a railing? : A Little 6 Click Score: 20    End of Session Equipment Utilized During Treatment: Gait belt Activity Tolerance: Patient tolerated treatment well Patient left: in bed;with call bell/phone within reach;with bed alarm set Nurse Communication: Mobility status PT Visit Diagnosis: Other abnormalities of gait and mobility (R26.89)     Time: 1191-4782 PT Time Calculation (min) (ACUTE ONLY): 17 min  Charges:    $Gait Training: 8-22 mins PT General Charges $$ ACUTE PT VISIT: 1 Visit                     Korrey Schleicher B. Beverely Risen PT, DPT Acute Rehabilitation Services Please use secure chat or  Call Office [phone]    Elon Alas [facility] 07/13/2023, 3:40 PM
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
## 2023-07-13 NOTE — Plan of Care (Signed)
  Problem: Tissue Perfusion: Goal: Adequacy of tissue perfusion will improve Outcome: Progressing   Problem: Clinical Measurements: Goal: Ability to maintain clinical measurements within normal limits will improve Outcome: Progressing Goal: Will remain free from infection Outcome: Progressing

## 2023-07-13 NOTE — Plan of Care (Signed)
  Problem: Education: Goal: Knowledge of General Education information will improve Description: Including pain rating scale, medication(s)/side effects and non-pharmacologic comfort measures Outcome: Progressing   Problem: Activity: Goal: Risk for activity intolerance will decrease Outcome: Progressing   

## 2023-07-13 NOTE — Progress Notes (Signed)
Occupational Therapy Treatment Patient Details Name: [name] MRN: [id number] DOB: 14-Sep-1966 Today's Date: 07/13/2023   History of present illness Pt is a 57 year old male admitted for evaluation of being unable to get up and ambulate, and not eating for 2 days, admitted for left foot gangrene with osteomyelitis. 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   OT comments  Pt actively participated, interpreter utilized, full participation in session. Pt able to complete ADLs supervision for cueing/initiation and hygiene, CGA for mobility. Pt has mild difficulty problem solving with dressing/toileting, but able to complete tasks displaying good overall activity tolerance, balance, strength. Pt would benefit from continued skilled therapy to maximize independence and improve as able, DC plan still appropriate. Goals updated      If plan is discharge home, recommend the following:  A little help with walking and/or transfers;A little help with bathing/dressing/bathroom;Assistance with cooking/housework;Assist for transportation   Equipment Recommendations  Other (comment) (defer)    Recommendations for Other Services      Precautions / Restrictions Precautions Precautions: Fall;Other (comment) Precaution Comments: History of TBI with left side weakness and cognitive impairments Required Braces or Orthoses: Other Brace Other Brace: L post op shoe Restrictions Weight Bearing Restrictions: No LLE Weight Bearing: Weight bearing as tolerated Other Position/Activity Restrictions: Upgraded to WBAT with postop shoe.       Mobility Bed Mobility Overal bed mobility: Needs Assistance Bed Mobility: Supine to Sit, Sit to Supine     Supine to sit: Supervision Sit to supine: Supervision   General bed mobility comments: supervision for safety    Transfers Overall transfer level: Needs  assistance Equipment used: 1 person hand held assist Transfers: Sit to/from Stand, Bed to chair/wheelchair/BSC Sit to Stand: Contact guard assist           General transfer comment: CGA     Balance Overall balance assessment: Needs assistance Sitting-balance support: No upper extremity supported, Feet supported Sitting balance-Leahy Scale: Normal Sitting balance - Comments: ADL completion   Standing balance support: Single extremity supported, During functional activity Standing balance-Leahy Scale: Fair Standing balance comment: looks for support but is able to stabilize with both static and dynamic balance at a contact guard level                           ADL either performed or assessed with clinical judgement   ADL Overall ADL's : Needs assistance/impaired Eating/Feeding: Set up;Sitting   Grooming: Wash/dry hands;Wash/dry face;Applying deodorant;Oral care;Brushing hair;Supervision/safety;Cueing for sequencing;Standing   Upper Body Bathing: Supervision/ safety;Cueing for sequencing;Sitting   Lower Body Bathing: Set up;Supervison/ safety;Sitting/lateral leans   Upper Body Dressing : Set up;Supervision/safety;Sitting   Lower Body Dressing: Set up;Supervision/safety;Sitting/lateral leans   Toilet Transfer: Contact guard assist;Ambulation   Toileting- Clothing Manipulation and Hygiene: Supervision/safety;Sitting/lateral lean       Functional mobility during ADLs: Contact guard assist General ADL Comments: set up/supervision for ADLs, cueing for initiation/hygiene, CGA for mobility    Extremity/Trunk Assessment Upper Extremity Assessment Upper Extremity Assessment: Overall WFL for tasks assessed            Vision       Perception     Praxis      Cognition Arousal: Alert Behavior During Therapy: Impulsive Overall Cognitive Status: History of cognitive impairments - at baseline  General Comments:  hx of cognitive impairment, impulsive, requires cues for safety and to ask for assistance. needs repeated cues to maintain corrections        Exercises      Shoulder Instructions       General Comments Interpreter Loistine Simas [phone]    Pertinent Vitals/ Pain       Pain Assessment Pain Assessment: No/denies pain  Home Living                                          Prior Functioning/Environment              Frequency  Min 1X/week        Progress Toward Goals  OT Goals(current goals can now be found in the care plan section)  Progress towards OT goals: Progressing toward goals  Acute Rehab OT Goals Patient Stated Goal: not able to participate in goal setting OT Goal Formulation: Patient unable to participate in goal setting Time For Goal Achievement: 07/27/23 Potential to Achieve Goals: Good ADL Goals Pt Will Perform Lower Body Dressing: with set-up;sit to/from stand Pt Will Transfer to Toilet: with supervision;ambulating;regular height toilet;grab bars Pt Will Perform Toileting - Clothing Manipulation and hygiene: with supervision;sit to/from stand Additional ADL Goal #1: Pt will independently call/request assistance when necessary for toileting to increase bladder/bowl continence and increase ability to manage personal/toilet hygiene.  Plan      Co-evaluation                 AM-PAC OT "6 Clicks" Daily Activity     Outcome Measure   Help from another person eating meals?: A Little Help from another person taking care of personal grooming?: A Little Help from another person toileting, which includes using toliet, bedpan, or urinal?: A Little Help from another person bathing (including washing, rinsing, drying)?: A Little Help from another person to put on and taking off regular upper body clothing?: A Little Help from another person to put on and taking off regular lower body clothing?: A Little 6 Click Score: 18    End of Session  Equipment Utilized During Treatment: Gait belt  OT Visit Diagnosis: Unsteadiness on feet (R26.81);Other abnormalities of gait and mobility (R26.89);Muscle weakness (generalized) (M62.81);Other symptoms and signs involving cognitive function;Pain Pain - Right/Left: Left Pain - part of body: Ankle and joints of foot   Activity Tolerance Patient tolerated treatment well   Patient Left in chair;with call bell/phone within reach;with chair alarm set   Nurse Communication Mobility status        Time: 2130-8657 OT Time Calculation (min): 29 min  Charges: OT General Charges $OT Visit: 1 Visit OT Treatments $Self Care/Home Management : 23-37 mins  [address] OTR/L   [name] 07/13/2023, 3:02 PM

## 2023-07-14 LAB — BASIC METABOLIC PANEL
Anion gap: 5 (ref 5–15)
BUN: 34 mg/dL — ABNORMAL HIGH (ref 6–20)
CO2: 29 mmol/L (ref 22–32)
Calcium: 8.8 mg/dL — ABNORMAL LOW (ref 8.9–10.3)
Chloride: 104 mmol/L (ref 98–111)
Creatinine, Ser: 1.04 mg/dL (ref 0.61–1.24)
GFR, Estimated: >60 mL/min (ref >60)
Glucose, Bld: 123 mg/dL — ABNORMAL HIGH (ref 70–99)
Potassium: 4.4 mmol/L (ref 3.5–5.1)
Sodium: 138 mmol/L (ref 135–145)

## 2023-07-14 LAB — GLUCOSE, CAPILLARY
Glucose-Capillary: 183 mg/dL — ABNORMAL HIGH (ref 70–99)
Glucose-Capillary: 102 mg/dL — ABNORMAL HIGH (ref 70–99)
Glucose-Capillary: 119 mg/dL — ABNORMAL HIGH (ref 70–99)
Glucose-Capillary: 153 mg/dL — ABNORMAL HIGH (ref 70–99)

## 2023-07-14 NOTE — Progress Notes (Signed)
Nutrition Follow-up  DOCUMENTATION CODES:   Not applicable  INTERVENTION:  Continue Ensure Enlive po BID, each supplement provides 350 kcal and 20 grams of protein.  Continue Juven 1 packet po BID. Each packet provides 95 kcal, 7 grams L-Arginine, 7 grams L-Glutamine, 2.5 grams collagen protein, 300 mg vitamin C, 9.5 mg zinc, and other micronutrients essential for wound healing.  Continue multivitamin with minerals po daily.  Recommend measuring weight once weekly.  NUTRITION DIAGNOSIS:   Increased nutrient needs related to acute illness (L foot gangrene with osteomyelitis; wound vac) as evidenced by estimated needs.  Ongoing - addressing with interventions.  GOAL:   Patient will meet greater than or equal to 90% of their needs  Met with interventions.  MONITOR:   PO intake, Supplement acceptance, Labs, Weight trends  REASON FOR ASSESSMENT:   Consult Assessment of nutrition requirement/status, Wound healing  ASSESSMENT:   57 yo male with h/o DM, TBI with residual MCI with aggressive behaviors and L hemiparesis, seizure d/o, and depression who presented on 7/24 with ambulatory dysfunction, lethargy, and anorexia x 2 days. He was found to have L foot gangrene with osteomyelitis.  7/31: s/p L TMA with wound VAC 8/8: wound VAC discontinued  Pt remains stable for discharge and is awaiting placement.  Met with pt at bedside with Spanish interpreter [phone]). Pt with hx of TBI and cognitive impairment at baseline. Pt reports his appetite is good and he is eating well. Pt documented to have eaten 100% of breakfast this morning. Per discussion with RN pt continues to eat well at meals and is finishing 100%. Documented to be drinking Ensure and Juven supplements. In the past 24 hrs pt has had approximately 2443 kcal (>100% estimated needs) and 113 grams of protein (>100% of estimated needs). Recommend continuing current interventions.  No updated weight obtained since 05/26/23.  Recommend measuring weight once weekly.  Medications reviewed and include: vitamin C 1000 mg daily, Ensure Enlive po BID, Novolog 0-9 units TID, Semglee 10 units at bedtime, Keppra, MVI daily, Juven BID, pantoprazole  Labs reviewed: CBG 95-189, BUN 34  UOP: 2200 mL UOP (1.7 mL/kg/hr) in previous 24 hrs  I/O: -10625 mL since 06/30/23 ( suspect may not be accurate as it appears PO intake may not be documented )  Diet Order:   Diet Order             Diet Carb Modified Fluid consistency: Thin; Room service appropriate? No  Diet effective now                  EDUCATION NEEDS:   Not appropriate for education at this time  Skin:  Skin Assessment: Skin Integrity Issues: Skin Integrity Issues:: Incisions Incisions: closed incision left foot  Last BM:  07/13/23 - medium type 4  Height:   Ht Readings from Last 1 Encounters:  03/11/23 5\' 2"  (1.575 m)   Weight:   Wt Readings from Last 1 Encounters:  05/26/23 54.6 kg   BMI:  Body mass index is 22.02 kg/m.  Estimated Nutritional Needs:   Kcal:  1750-1950 kcals  Protein:  80-100 grams  Fluid:  >/= 1.7L  Letta Median, MS, RD, LDN, CNSC Pager number available on Amion

## 2023-07-14 NOTE — Progress Notes (Addendum)
   07/14/23 1534  Mobility  Activity Ambulated with assistance in hallway  Level of Assistance Contact guard assist, steadying assist  Assistive Device Front wheel walker (HHA)  Distance Ambulated (ft) 175 ft  LLE Weight Bearing WBAT  Activity Response Tolerated well  Mobility Referral Yes  $Mobility charge 1 Mobility  Mobility Specialist Start Time (ACUTE ONLY) 1507  Mobility Specialist Stop Time (ACUTE ONLY) 1527  Mobility Specialist Time Calculation (min) (ACUTE ONLY) 20 min   Mobility Specialist: Progress Note  Pt agreeable to mobility session - received in bed. Required HHA STS, RW for ambulation. Pt with unsteady gait and a strong forward lean reaching for support from door and counter drifting L to R with HHA despite verbal cues provided. For safety, MS asked pt if he would like to use RW. Pt responded "yes." Pt given multiple verbal and tactile cues to stay inside RW with continued forward lean, gait was steady. Pt returned to chair  with all needs met - call bell within reach. Chair alarm on.    Barnie Mort, BS Mobility Specialist Please contact via SecureChat or Rehab office at [phone].
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
## 2023-07-14 NOTE — Plan of Care (Signed)
  Problem: Education: Goal: Knowledge of General Education information will improve Description: Including pain rating scale, medication(s)/side effects and non-pharmacologic comfort measures Outcome: Progressing   Problem: Activity: Goal: Risk for activity intolerance will decrease Outcome: Progressing   

## 2023-07-15 LAB — GLUCOSE, CAPILLARY
Glucose-Capillary: 179 mg/dL — ABNORMAL HIGH (ref 70–99)
Glucose-Capillary: 98 mg/dL (ref 70–99)
Glucose-Capillary: 115 mg/dL — ABNORMAL HIGH (ref 70–99)
Glucose-Capillary: 176 mg/dL — ABNORMAL HIGH (ref 70–99)
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
## 2023-07-15 NOTE — Progress Notes (Signed)
Physical Therapy Treatment Patient Details Name: [name] MRN: [id number] DOB: Oct 29, 1966 Today's Date: 07/15/2023   History of Present Illness Pt is a 57 year old male admitted for evaluation of being unable to get up and ambulate, and not eating for 2 days, admitted for left foot gangrene with osteomyelitis. 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    PT Comments  Pt with improved stability over session with Mobility specialist yesterday. Pt is not sure why he was so unsteady yesterday. Pt continues to need cuing with donning shoes, and is contact guard for transfers and and ambulation. Started ambulation with RW but able to quickly move away from its use due to improved stability. Worked with pt on high level balance and stairs prior to return to room. D/c plan remains appropriate. PT will continue to follow acutely.     If plan is discharge home, recommend the following: A little help with walking and/or transfers;A little help with bathing/dressing/bathroom;Assistance with cooking/housework;Direct supervision/assist for medications management;Assist for transportation;Help with stairs or ramp for entrance   Can travel by private vehicle     No  Equipment Recommendations   (will continue to discern)       Precautions / Restrictions Precautions Precautions: Fall;Other (comment) Precaution Comments: History of TBI with left side weakness and cognitive impairments Required Braces or Orthoses: Other Brace Other Brace: L post op shoe Restrictions Weight Bearing Restrictions: No LLE Weight Bearing: Weight bearing as tolerated Other Position/Activity Restrictions: Upgraded to WBAT with postop shoe.     Mobility  Bed Mobility Overal bed mobility: Needs Assistance Bed Mobility: Supine to Sit, Sit to Supine     Supine to sit: Supervision     General bed mobility comments: pt able  to pick shoes up from floor and don them in bed, vc for improving fit of post op shoe by tightening velcro    Transfers Overall transfer level: Needs assistance Equipment used: Rolling walker (2 wheels) Transfers: Sit to/from Stand Sit to Stand: Contact guard assist           General transfer comment: good power up, continues to use posterior support of LE on bed to steady himself, despite cuing for upright posterior and forward lean    Ambulation/Gait Ambulation/Gait assistance: Contact guard assist   Assistive device: 1 person hand held assist, Rolling walker (2 wheels) Gait Pattern/deviations: Shuffle, Staggering left, Staggering right, Wide base of support Gait velocity: only occasional cuing for decreased velocity Gait velocity interpretation: <1.8 ft/sec, indicate of risk for recurrent falls   General Gait Details: pt utilized RW yesterday for mobilization as he was more unsteady with his gait, initiated ambulation with RW but progressed away from it to continue to challenge his dynamic balance, providing contact guard assist   Stairs Stairs: Yes Stairs assistance: Contact guard assist Stair Management: Step to pattern Number of Stairs: 5 (x2) General stair comments: contact guard with vc for sequencing and slowing down       Balance Overall balance assessment: Needs assistance Sitting-balance support: No upper extremity supported, Feet supported Sitting balance-Leahy Scale: Normal     Standing balance support: Single extremity supported, During functional activity Standing balance-Leahy Scale: Fair Standing balance comment: looks for support but is able to stabilize with both static and dynamic balance at a contact guard level             High level balance activites: Direction changes, Turns, Sudden stops,  Head turns High Level Balance Comments: pt requiring CGA            Cognition Arousal: Alert Behavior During Therapy: Impulsive Overall  Cognitive Status: History of cognitive impairments - at baseline                                 General Comments: hx of cognitive impairment, impulsive, requires cues for safety and to ask for assistance. needs repeated cues to maintain corrections           General Comments General comments (skin integrity, edema, etc.): in house interpreter utilized throughout session      Pertinent Vitals/Pain Pain Assessment Pain Assessment: No/denies pain     PT Goals (current goals can now be found in the care plan section) Acute Rehab PT Goals PT Goal Formulation: With patient Time For Goal Achievement: 06/24/23 Potential to Achieve Goals: Good Progress towards PT goals: Progressing toward goals    Frequency    Min 1X/week      PT Plan Current plan remains appropriate       AM-PAC PT "6 Clicks" Mobility   Outcome Measure  Help needed turning from your back to your side while in a flat bed without using bedrails?: None Help needed moving from lying on your back to sitting on the side of a flat bed without using bedrails?: None Help needed moving to and from a bed to a chair (including a wheelchair)?: A Little Help needed standing up from a chair using your arms (e.g., wheelchair or bedside chair)?: A Little Help needed to walk in hospital room?: A Little Help needed climbing 3-5 steps with a railing? : A Little 6 Click Score: 20    End of Session Equipment Utilized During Treatment: Gait belt Activity Tolerance: Patient tolerated treatment well Patient left: in bed;with call bell/phone within reach;with bed alarm set;with family/visitor present Nurse Communication: Mobility status PT Visit Diagnosis: Other abnormalities of gait and mobility (R26.89)     Time: 4782-9562 PT Time Calculation (min) (ACUTE ONLY): 17 min  Charges:    $Gait Training: 8-22 mins PT General Charges $$ ACUTE PT VISIT: 1 Visit                     Yani Lal B. Beverely Risen PT,  DPT Acute Rehabilitation Services Please use secure chat or  Call Office [phone]    Elon Alas [facility] 07/15/2023, 2:39 PM

## 2023-07-15 NOTE — Plan of Care (Signed)
Problem: Education: Goal: Ability to describe self-care measures that may prevent or decrease complications (Diabetes Survival Skills Education) will improve Outcome: Not Progressing Goal: Individualized Educational Video(s) Outcome: Not Progressing   Problem: Coping: Goal: Ability to adjust to condition or change in health will improve Outcome: Not Progressing   Problem: Health Behavior/Discharge Planning: Goal: Ability to identify and utilize available resources and services will improve Outcome: Not Progressing Goal: Ability to manage health-related needs will improve Outcome: Not Progressing   Problem: Metabolic: Goal: Ability to maintain appropriate glucose levels will improve Outcome: Not Progressing   Problem: Nutritional: Goal: Progress toward achieving an optimal weight will improve Outcome: Not Progressing   Problem: Skin Integrity: Goal: Risk for impaired skin integrity will decrease Outcome: Not Progressing   Problem: Tissue Perfusion: Goal: Adequacy of tissue perfusion will improve Outcome: Not Progressing   Problem: Education: Goal: Knowledge of General Education information will improve Description: Including pain rating scale, medication(s)/side effects and non-pharmacologic comfort measures Outcome: Not Progressing   Problem: Health Behavior/Discharge Planning: Goal: Ability to manage health-related needs will improve Outcome: Not Progressing   Problem: Clinical Measurements: Goal: Ability to maintain clinical measurements within normal limits will improve Outcome: Not Progressing Goal: Will remain free from infection Outcome: Not Progressing Goal: Diagnostic test results will improve Outcome: Not Progressing Goal: Respiratory complications will improve Outcome: Not Progressing Goal: Cardiovascular complication will be avoided Outcome: Not Progressing   Problem: Activity: Goal: Risk for activity intolerance will decrease Outcome: Not  Progressing   Problem: Nutrition: Goal: Adequate nutrition will be maintained Outcome: Not Progressing   Problem: Coping: Goal: Level of anxiety will decrease Outcome: Not Progressing   Problem: Elimination: Goal: Will not experience complications related to bowel motility Outcome: Not Progressing Goal: Will not experience complications related to urinary retention Outcome: Not Progressing   Problem: Pain Managment: Goal: General experience of comfort will improve Outcome: Not Progressing   Problem: Safety: Goal: Ability to remain free from injury will improve Outcome: Not Progressing   Problem: Skin Integrity: Goal: Risk for impaired skin integrity will decrease Outcome: Not Progressing   Problem: Education: Goal: Knowledge of General Education information will improve Description: Including pain rating scale, medication(s)/side effects and non-pharmacologic comfort measures Outcome: Not Progressing   Problem: Health Behavior/Discharge Planning: Goal: Ability to manage health-related needs will improve Outcome: Not Progressing   Problem: Clinical Measurements: Goal: Ability to maintain clinical measurements within normal limits will improve Outcome: Not Progressing Goal: Will remain free from infection Outcome: Not Progressing Goal: Diagnostic test results will improve Outcome: Not Progressing Goal: Respiratory complications will improve Outcome: Not Progressing Goal: Cardiovascular complication will be avoided Outcome: Not Progressing   Problem: Activity: Goal: Risk for activity intolerance will decrease Outcome: Not Progressing   Problem: Nutrition: Goal: Adequate nutrition will be maintained Outcome: Not Progressing   Problem: Coping: Goal: Level of anxiety will decrease Outcome: Not Progressing   Problem: Elimination: Goal: Will not experience complications related to bowel motility Outcome: Not Progressing Goal: Will not experience complications  related to urinary retention Outcome: Not Progressing   Problem: Pain Managment: Goal: General experience of comfort will improve Outcome: Not Progressing   Problem: Safety: Goal: Ability to remain free from injury will improve Outcome: Not Progressing   Problem: Skin Integrity: Goal: Risk for impaired skin integrity will decrease Outcome: Not Progressing   Problem: Education: Goal: Knowledge of the prescribed therapeutic regimen will improve Outcome: Not Progressing Goal: Ability to verbalize activity precautions or restrictions will improve Outcome: Not Progressing Goal:  Understanding of discharge needs will improve Outcome: Not Progressing   Problem: Activity: Goal: Ability to perform//tolerate increased activity and mobilize with assistive devices will improve Outcome: Not Progressing

## 2023-07-16 LAB — GLUCOSE, CAPILLARY
Glucose-Capillary: 131 mg/dL — ABNORMAL HIGH (ref 70–99)
Glucose-Capillary: 157 mg/dL — ABNORMAL HIGH (ref 70–99)
Glucose-Capillary: 136 mg/dL — ABNORMAL HIGH (ref 70–99)
Glucose-Capillary: 107 mg/dL — ABNORMAL HIGH (ref 70–99)

## 2023-07-16 NOTE — Progress Notes (Signed)
Mobility Specialist: Progress Note   07/16/23 1609  Mobility  Activity Ambulated with assistance in hallway  Level of Assistance Contact guard assist, steadying assist  Assistive Device Front wheel walker;Other (Comment) (HHA)  Distance Ambulated (ft) 300 ft  LLE Weight Bearing WBAT (with post op boot)  Activity Response Tolerated well  Mobility Referral Yes  $Mobility charge 1 Mobility  Mobility Specialist Start Time (ACUTE ONLY) 1411  Mobility Specialist Stop Time (ACUTE ONLY) 1416  Mobility Specialist Time Calculation (min) (ACUTE ONLY) 5 min    Pt was agreeable to mobility session - received in bed. Ambulated halfway with RW, then completed ambulated with HHA. CG throughout. Required few verbal cues to correct RW use and gait. Returned to room without fault. Left in bed with all needs met, call bell in reach.   [name] Mobility Specialist Please contact via SecureChat or Rehab office at [phone]

## 2023-07-16 NOTE — Progress Notes (Signed)
Occupational Therapy Treatment Patient Details Name: [name] MRN: [id number] DOB: 08-Mar-1966 Today's Date: 07/16/2023   History of present illness Pt is a 57 year old male admitted for evaluation of being unable to get up and ambulate, and not eating for 2 days, admitted for left foot gangrene with osteomyelitis. 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   OT comments  In house interpretor present during session. Patient continues to demonstrate good progress with OT treatment. Patient is supervision to setup for self care but often requires cues for sequencing and thoroughness. Mobility performed with RW and HHA to address safety and balance. Discharge recommendations continue to be appropriate. Acute OT to continue to follow.       If plan is discharge home, recommend the following:  A little help with walking and/or transfers;A little help with bathing/dressing/bathroom;Assistance with cooking/housework;Assist for transportation   Equipment Recommendations  Other (comment) (defer)    Recommendations for Other Services      Precautions / Restrictions Precautions Precautions: Fall;Other (comment) Precaution Comments: History of TBI with left side weakness and cognitive impairments Required Braces or Orthoses: Other Brace Other Brace: L post op shoe Restrictions Weight Bearing Restrictions: No LLE Weight Bearing: Touchdown weight bearing Other Position/Activity Restrictions: Upgraded to WBAT with postop shoe.       Mobility Bed Mobility Overal bed mobility: Needs Assistance Bed Mobility: Sit to Supine       Sit to supine: Supervision   General bed mobility comments: seated on EOB upon entry, in supine at end of session    Transfers Overall transfer level: Needs assistance Equipment used: Rolling walker (2 wheels), None Transfers: Sit to/from Stand Sit to Stand:  Contact guard assist           General transfer comment: CGA for safety, cues to use RW correctly     Balance Overall balance assessment: Needs assistance Sitting-balance support: No upper extremity supported, Feet supported Sitting balance-Leahy Scale: Normal Sitting balance - Comments: seated on EOB upon entry   Standing balance support: Single extremity supported, During functional activity Standing balance-Leahy Scale: Fair Standing balance comment: requires UE support for dynamic activities                           ADL either performed or assessed with clinical judgement   ADL Overall ADL's : Needs assistance/impaired     Grooming: Wash/dry hands;Wash/dry face;Oral care;Applying deodorant;Brushing hair;Supervision/safety;Standing Grooming Details (indicate cue type and reason): at sink Upper Body Bathing: Supervision/ safety;Cueing for sequencing;Sitting Upper Body Bathing Details (indicate cue type and reason): at sink Lower Body Bathing: Set up;Supervison/ safety;Sit to/from stand Lower Body Bathing Details (indicate cue type and reason): stood at sink with cues to stay on tasks and sequencing Upper Body Dressing : Set up;Supervision/safety;Sitting Upper Body Dressing Details (indicate cue type and reason): to donn paper scrub top Lower Body Dressing: Set up;Supervision/safety;Sitting/lateral leans Lower Body Dressing Details (indicate cue type and reason): to donn scrub bottoms, shoe and post op shoe Toilet Transfer: Contact guard assist;Ambulation;Grab bars;Rolling walker (2 wheels) Toilet Transfer Details (indicate cue type and reason): cues for safety Toileting- Clothing Manipulation and Hygiene: Supervision/safety;Sitting/lateral lean         General ADL Comments: setup to supervision for self care with cues to stay on tasks and for throughness    Extremity/Trunk Assessment  Vision       Perception     Praxis       Cognition Arousal: Alert Behavior During Therapy: Impulsive Overall Cognitive Status: History of cognitive impairments - at baseline                                 General Comments: frequent cues for safety and walker use, history of cognitive impairments        Exercises      Shoulder Instructions       General Comments in house interpretor present during session    Pertinent Vitals/ Pain       Pain Assessment Pain Assessment: No/denies pain Pain Intervention(s): Monitored during session  Home Living                                          Prior Functioning/Environment              Frequency  Min 1X/week        Progress Toward Goals  OT Goals(current goals can now be found in the care plan section)  Progress towards OT goals: Progressing toward goals  Acute Rehab OT Goals OT Goal Formulation: Patient unable to participate in goal setting Time For Goal Achievement: 07/27/23 Potential to Achieve Goals: Good ADL Goals Pt Will Perform Lower Body Dressing: with modified independence Pt Will Transfer to Toilet: with modified independence Pt Will Perform Toileting - Clothing Manipulation and hygiene: with modified independence Additional ADL Goal #1: Pt will independently call/request assistance when necessary for toileting to increase bladder/bowl continence and increase ability to manage personal/toilet hygiene.  Plan      Co-evaluation                 AM-PAC OT "6 Clicks" Daily Activity     Outcome Measure   Help from another person eating meals?: A Little Help from another person taking care of personal grooming?: A Little Help from another person toileting, which includes using toliet, bedpan, or urinal?: A Little Help from another person bathing (including washing, rinsing, drying)?: A Little Help from another person to put on and taking off regular upper body clothing?: A Little Help from another person to  put on and taking off regular lower body clothing?: A Little 6 Click Score: 18    End of Session Equipment Utilized During Treatment: Gait belt;Rolling walker (2 wheels)  OT Visit Diagnosis: Unsteadiness on feet (R26.81);Other abnormalities of gait and mobility (R26.89);Muscle weakness (generalized) (M62.81);Other symptoms and signs involving cognitive function;Pain   Activity Tolerance Patient tolerated treatment well   Patient Left in bed;with call bell/phone within reach;with bed alarm set   Nurse Communication Mobility status        Time: 1914-7829 OT Time Calculation (min): 24 min  Charges: OT General Charges $OT Visit: 1 Visit OT Treatments $Self Care/Home Management : 8-22 mins $Therapeutic Activity: 8-22 mins  Alfonse Flavors, OTA Acute Rehabilitation Services  Office [phone]   Dewain Penning 07/16/2023, 2:34 PM
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
## 2023-07-16 NOTE — Plan of Care (Signed)
Problem: Education: Goal: Ability to describe self-care measures that may prevent or decrease complications (Diabetes Survival Skills Education) will improve Outcome: Not Progressing Goal: Individualized Educational Video(s) Outcome: Not Progressing   Problem: Coping: Goal: Ability to adjust to condition or change in health will improve Outcome: Not Progressing   Problem: Health Behavior/Discharge Planning: Goal: Ability to identify and utilize available resources and services will improve Outcome: Not Progressing Goal: Ability to manage health-related needs will improve Outcome: Not Progressing   Problem: Metabolic: Goal: Ability to maintain appropriate glucose levels will improve Outcome: Not Progressing   Problem: Nutritional: Goal: Progress toward achieving an optimal weight will improve Outcome: Not Progressing   Problem: Skin Integrity: Goal: Risk for impaired skin integrity will decrease Outcome: Not Progressing   Problem: Tissue Perfusion: Goal: Adequacy of tissue perfusion will improve Outcome: Not Progressing   Problem: Education: Goal: Knowledge of General Education information will improve Description: Including pain rating scale, medication(s)/side effects and non-pharmacologic comfort measures Outcome: Not Progressing   Problem: Health Behavior/Discharge Planning: Goal: Ability to manage health-related needs will improve Outcome: Not Progressing   Problem: Clinical Measurements: Goal: Ability to maintain clinical measurements within normal limits will improve Outcome: Not Progressing Goal: Will remain free from infection Outcome: Not Progressing Goal: Diagnostic test results will improve Outcome: Not Progressing Goal: Respiratory complications will improve Outcome: Not Progressing Goal: Cardiovascular complication will be avoided Outcome: Not Progressing   Problem: Activity: Goal: Risk for activity intolerance will decrease Outcome: Not  Progressing   Problem: Nutrition: Goal: Adequate nutrition will be maintained Outcome: Not Progressing   Problem: Coping: Goal: Level of anxiety will decrease Outcome: Not Progressing   Problem: Elimination: Goal: Will not experience complications related to bowel motility Outcome: Not Progressing Goal: Will not experience complications related to urinary retention Outcome: Not Progressing   Problem: Pain Managment: Goal: General experience of comfort will improve Outcome: Not Progressing   Problem: Safety: Goal: Ability to remain free from injury will improve Outcome: Not Progressing   Problem: Skin Integrity: Goal: Risk for impaired skin integrity will decrease Outcome: Not Progressing   Problem: Education: Goal: Knowledge of General Education information will improve Description: Including pain rating scale, medication(s)/side effects and non-pharmacologic comfort measures Outcome: Not Progressing   Problem: Health Behavior/Discharge Planning: Goal: Ability to manage health-related needs will improve Outcome: Not Progressing   Problem: Clinical Measurements: Goal: Ability to maintain clinical measurements within normal limits will improve Outcome: Not Progressing Goal: Will remain free from infection Outcome: Not Progressing Goal: Diagnostic test results will improve Outcome: Not Progressing Goal: Respiratory complications will improve Outcome: Not Progressing Goal: Cardiovascular complication will be avoided Outcome: Not Progressing   Problem: Activity: Goal: Risk for activity intolerance will decrease Outcome: Not Progressing   Problem: Nutrition: Goal: Adequate nutrition will be maintained Outcome: Not Progressing   Problem: Coping: Goal: Level of anxiety will decrease Outcome: Not Progressing   Problem: Elimination: Goal: Will not experience complications related to bowel motility Outcome: Not Progressing Goal: Will not experience complications  related to urinary retention Outcome: Not Progressing   Problem: Pain Managment: Goal: General experience of comfort will improve Outcome: Not Progressing   Problem: Safety: Goal: Ability to remain free from injury will improve Outcome: Not Progressing   Problem: Skin Integrity: Goal: Risk for impaired skin integrity will decrease Outcome: Not Progressing   Problem: Education: Goal: Knowledge of the prescribed therapeutic regimen will improve Outcome: Not Progressing Goal: Ability to verbalize activity precautions or restrictions will improve Outcome: Not Progressing Goal:  Understanding of discharge needs will improve Outcome: Not Progressing   Problem: Activity: Goal: Ability to perform//tolerate increased activity and mobilize with assistive devices will improve Outcome: Not Progressing

## 2023-07-17 LAB — GLUCOSE, CAPILLARY
Glucose-Capillary: 175 mg/dL — ABNORMAL HIGH (ref 70–99)
Glucose-Capillary: 114 mg/dL — ABNORMAL HIGH (ref 70–99)
Glucose-Capillary: 118 mg/dL — ABNORMAL HIGH (ref 70–99)
Glucose-Capillary: 116 mg/dL — ABNORMAL HIGH (ref 70–99)
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
## 2023-07-17 NOTE — Plan of Care (Signed)
Problem: Skin Integrity: Goal: Risk for impaired skin integrity will decrease Outcome: Progressing   Problem: Activity: Goal: Risk for activity intolerance will decrease Outcome: Progressing   Problem: Nutrition: Goal: Adequate nutrition will be maintained Outcome: Progressing

## 2023-07-17 NOTE — Plan of Care (Signed)
  Problem: Skin Integrity: Goal: Risk for impaired skin integrity will decrease Outcome: Progressing   Problem: Activity: Goal: Risk for activity intolerance will decrease Outcome: Progressing   Problem: Nutrition: Goal: Adequate nutrition will be maintained Outcome: Progressing   

## 2023-07-17 NOTE — Progress Notes (Signed)
Mobility Specialist: Progress Note   07/17/23 1552  Mobility  Activity Ambulated with assistance in hallway  Level of Assistance Contact guard assist, steadying assist  Assistive Device Front wheel walker  Distance Ambulated (ft) 550 ft  LLE Weight Bearing WBAT (with post op boot)  Activity Response Tolerated well  Mobility Referral Yes  $Mobility charge 1 Mobility  Mobility Specialist Start Time (ACUTE ONLY) 1328  Mobility Specialist Stop Time (ACUTE ONLY) 1343  Mobility Specialist Time Calculation (min) (ACUTE ONLY) 15 min    Pt was agreeable to mobility session - received in bed. Interpreter Graciella present and helpful throughout session. Gait has improved; retaining instructions well but still required some verbal and tactile cues for RW use, steps, and posture. Stated he was a little tired at EOS but otherwise asymptomatic. Returned to room without fault. Left in bed with all needs met, call bell in reach.   [name] Mobility Specialist Please contact via SecureChat or Rehab office at [phone]

## 2023-07-17 NOTE — Plan of Care (Signed)

## 2023-07-18 LAB — GLUCOSE, CAPILLARY
Glucose-Capillary: 118 mg/dL — ABNORMAL HIGH (ref 70–99)
Glucose-Capillary: 102 mg/dL — ABNORMAL HIGH (ref 70–99)
Glucose-Capillary: 121 mg/dL — ABNORMAL HIGH (ref 70–99)
Glucose-Capillary: 119 mg/dL — ABNORMAL HIGH (ref 70–99)
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
## 2023-07-18 NOTE — Plan of Care (Signed)
Problem: Skin Integrity: Goal: Risk for impaired skin integrity will decrease Outcome: Progressing   Problem: Activity: Goal: Risk for activity intolerance will decrease Outcome: Progressing   Problem: Safety: Goal: Ability to remain free from injury will improve Outcome: Progressing

## 2023-07-18 NOTE — Plan of Care (Signed)
Problem: Pain Managment: Goal: General experience of comfort will improve Outcome: Progressing   Problem: Safety: Goal: Ability to remain free from injury will improve Outcome: Progressing   Problem: Skin Integrity: Goal: Risk for impaired skin integrity will decrease Outcome: Progressing

## 2023-07-18 NOTE — Progress Notes (Signed)
Mobility Specialist: Progress Note   07/18/23 1626  Mobility  Activity Ambulated with assistance in hallway  Level of Assistance Contact guard assist, steadying assist  Assistive Device Front wheel walker  Distance Ambulated (ft) 550 ft  LLE Weight Bearing WBAT (with post op shoe)  Activity Response Tolerated well  Mobility Referral Yes  $Mobility charge 1 Mobility  Mobility Specialist Start Time (ACUTE ONLY) 1333  Mobility Specialist Stop Time (ACUTE ONLY) 1340  Mobility Specialist Time Calculation (min) (ACUTE ONLY) 7 min    Pt was agreeable to mobility session - received in bed. Pt able to don/doff post op shoe and tennis shoe. Required verbal and tactile cues for correcting posture. Retaining information on proper RW postioning and slowing gait. Returned to room without fault. Left in bed with all needs met, call bell in reach.   [name] Mobility Specialist Please contact via SecureChat or Rehab office at [phone]

## 2023-07-19 ENCOUNTER — Ambulatory Visit: Payer: Self-pay | Admitting: Internal Medicine

## 2023-07-19 LAB — GLUCOSE, CAPILLARY
Glucose-Capillary: 107 mg/dL — ABNORMAL HIGH (ref 70–99)
Glucose-Capillary: 169 mg/dL — ABNORMAL HIGH (ref 70–99)
Glucose-Capillary: 111 mg/dL — ABNORMAL HIGH (ref 70–99)
Glucose-Capillary: 130 mg/dL — ABNORMAL HIGH (ref 70–99)

## 2023-07-19 NOTE — Plan of Care (Signed)
  Problem: Education: Goal: Knowledge of General Education information will improve Description: Including pain rating scale, medication(s)/side effects and non-pharmacologic comfort measures Outcome: Progressing   Problem: Activity: Goal: Risk for activity intolerance will decrease Outcome: Progressing   
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
## 2023-07-19 NOTE — Progress Notes (Signed)
   07/19/23 1157  Mobility  Activity Ambulated with assistance in hallway  Level of Assistance Contact guard assist, steadying assist (SV for Bed Mobility)  Assistive Device Front wheel walker  Distance Ambulated (ft) 580 ft  LLE Weight Bearing WBAT (Post Op Shoe)  Activity Response Tolerated well  Mobility Referral Yes  $Mobility charge 1 Mobility  Mobility Specialist Start Time (ACUTE ONLY) 1145  Mobility Specialist Stop Time (ACUTE ONLY) 1155  Mobility Specialist Time Calculation (min) (ACUTE ONLY) 10 min   Mobility Specialist: Progress Note  Pt agreeable to mobility session - received in bed. Required CG using RW with no complaints.Pt able to don/doff post op shoe and tennis shoe. Required verbal and tactile cues for correcting posture. Pt returned to bed with all needs met - call bell within reach. Bed alarm on.   Barnie Mort, BS Mobility Specialist Please contact via SecureChat or Rehab office at [phone].

## 2023-07-20 ENCOUNTER — Ambulatory Visit: Payer: Self-pay | Admitting: Internal Medicine

## 2023-07-20 LAB — GLUCOSE, CAPILLARY
Glucose-Capillary: 115 mg/dL — ABNORMAL HIGH (ref 70–99)
Glucose-Capillary: 108 mg/dL — ABNORMAL HIGH (ref 70–99)
Glucose-Capillary: 116 mg/dL — ABNORMAL HIGH (ref 70–99)
Glucose-Capillary: 106 mg/dL — ABNORMAL HIGH (ref 70–99)
Glucose-Capillary: 107 mg/dL — ABNORMAL HIGH (ref 70–99)

## 2023-07-20 NOTE — Progress Notes (Signed)
Physical Therapy Treatment Patient Details Name: [name] MRN: [id number] DOB: Sep 01, 1966 Today's Date: 07/20/2023   History of Present Illness Pt is a 57 year old male admitted for evaluation of being unable to get up and ambulate, and not eating for 2 days, admitted for left foot gangrene with osteomyelitis. 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    PT Comments  Pt continues to make progress with his stability through strengthening with frequent mobility with mobility specialist and therapy. Pt continues to increase his velocity when he becomes tired instead of taking a rest break. Practiced with decent of steps after increased fatigue and difficulty with foot clearance. Had standing rest break before completing decent of steps. Pt with rushed behavior with return to room and attempt to sit on bed leaning over with UE on bed and walking feet towards bed before sitting. Had pt re-attempt with completely turning and reaching back to bed surface to sit. D/c plans remain appropriate. PT will continue to follow acutely.    If plan is discharge home, recommend the following: A little help with walking and/or transfers;A little help with bathing/dressing/bathroom;Assistance with cooking/housework;Direct supervision/assist for medications management;Assist for transportation;Help with stairs or ramp for entrance   Can travel by private vehicle     No  Equipment Recommendations   (will continue to discern)       Precautions / Restrictions Precautions Precautions: Fall;Other (comment) Precaution Comments: History of TBI with left side weakness and cognitive impairments Required Braces or Orthoses: Other Brace Other Brace: L post op shoe Restrictions Weight Bearing Restrictions: No LLE Weight Bearing: Weight bearing as tolerated Other Position/Activity Restrictions: Upgraded to WBAT with postop  shoe.     Mobility  Bed Mobility Overal bed mobility: Needs Assistance Bed Mobility: Supine to Sit, Sit to Supine     Supine to sit: Supervision Sit to supine: Modified independent (Device/Increase time)   General bed mobility comments: pt with shoes on in bed and pops up to seated EoB easily, takes off shoes and with minor assist of bed rail pulls LE back into bed    Transfers Overall transfer level: Needs assistance Equipment used: Rolling walker (2 wheels) Transfers: Sit to/from Stand Sit to Stand: Contact guard assist           General transfer comment: pt uses bracing of back of LE on bed to gain stability, pt reaches out for bed with UE before close to bed and then walks UE along surface of bed prior bringing bottom around to sit on bed, cued for turning all the way around prior to sitting, and had pt repeat process    Ambulation/Gait Ambulation/Gait assistance: Contact guard assist Gait Distance (Feet): 580 Feet Assistive device: 1 person hand held assist, None Gait Pattern/deviations: Shuffle, Staggering left, Staggering right, Wide base of support Gait velocity: only occasional cuing for decreased velocity Gait velocity interpretation: <1.8 ft/sec, indicate of risk for recurrent falls   General Gait Details: initiated gait with single UE support, but PT removes and educates that pt balance is better when he is able move his arms with his walking,   Stairs Stairs: Yes Stairs assistance: Contact guard assist, Min assist Stair Management: Step to pattern, One rail Left, Forwards Number of Stairs: 15 General stair comments: initially contact guard for safety with good step to pattern, increased fatigue with descent of steps provided min A, pt with increased cadence in attempt to get to  bottom of stairs more quickly educated on standing restbreak instead of rushing going down the stairs      Balance Overall balance assessment: Needs assistance Sitting-balance  support: No upper extremity supported, Feet supported Sitting balance-Leahy Scale: Normal     Standing balance support: Single extremity supported, During functional activity Standing balance-Leahy Scale: Fair Standing balance comment: looks for support but is able to stabilize with both static and dynamic balance at a contact guard level                            Cognition Arousal: Alert Behavior During Therapy: Impulsive Overall Cognitive Status: History of cognitive impairments - at baseline                                 General Comments: hx of cognitive impairment, impulsive, requires cues for safety and to ask for assistance. needs repeated cues to maintain corrections           General Comments General comments (skin integrity, edema, etc.): in house interpreter Raquel used throughout session      Pertinent Vitals/Pain Pain Assessment Pain Assessment: No/denies pain     PT Goals (current goals can now be found in the care plan section) Acute Rehab PT Goals PT Goal Formulation: With patient Time For Goal Achievement: 08/03/23 Potential to Achieve Goals: Good Progress towards PT goals: Progressing toward goals    Frequency    Min 1X/week      PT Plan Current plan remains appropriate       AM-PAC PT "6 Clicks" Mobility   Outcome Measure  Help needed turning from your back to your side while in a flat bed without using bedrails?: None Help needed moving from lying on your back to sitting on the side of a flat bed without using bedrails?: None Help needed moving to and from a bed to a chair (including a wheelchair)?: A Little Help needed standing up from a chair using your arms (e.g., wheelchair or bedside chair)?: A Little Help needed to walk in hospital room?: A Little Help needed climbing 3-5 steps with a railing? : A Little 6 Click Score: 20    End of Session Equipment Utilized During Treatment: Gait belt Activity  Tolerance: Patient tolerated treatment well Patient left: in bed;with call bell/phone within reach;with bed alarm set Nurse Communication: Mobility status PT Visit Diagnosis: Other abnormalities of gait and mobility (R26.89)     Time: 9562-1308 PT Time Calculation (min) (ACUTE ONLY): 16 min  Charges:    $Gait Training: 8-22 mins PT General Charges $$ ACUTE PT VISIT: 1 Visit                     Nicolaos Mitrano B. Beverely Risen PT, DPT Acute Rehabilitation Services Please use secure chat or  Call Office [phone]    Elon Alas [facility] 07/20/2023, 2:32 PM

## 2023-07-20 NOTE — Plan of Care (Signed)
Problem: Education: Goal: Ability to describe self-care measures that may prevent or decrease complications (Diabetes Survival Skills Education) will improve Outcome: Not Progressing Goal: Individualized Educational Video(s) Outcome: Not Progressing   Problem: Coping: Goal: Ability to adjust to condition or change in health will improve Outcome: Not Progressing   Problem: Health Behavior/Discharge Planning: Goal: Ability to identify and utilize available resources and services will improve Outcome: Not Progressing Goal: Ability to manage health-related needs will improve Outcome: Not Progressing   Problem: Metabolic: Goal: Ability to maintain appropriate glucose levels will improve Outcome: Not Progressing   Problem: Nutritional: Goal: Progress toward achieving an optimal weight will improve Outcome: Not Progressing   Problem: Skin Integrity: Goal: Risk for impaired skin integrity will decrease Outcome: Not Progressing   Problem: Tissue Perfusion: Goal: Adequacy of tissue perfusion will improve Outcome: Not Progressing   Problem: Education: Goal: Knowledge of General Education information will improve Description: Including pain rating scale, medication(s)/side effects and non-pharmacologic comfort measures Outcome: Not Progressing   Problem: Health Behavior/Discharge Planning: Goal: Ability to manage health-related needs will improve Outcome: Not Progressing   Problem: Clinical Measurements: Goal: Ability to maintain clinical measurements within normal limits will improve Outcome: Not Progressing Goal: Will remain free from infection Outcome: Not Progressing Goal: Diagnostic test results will improve Outcome: Not Progressing Goal: Respiratory complications will improve Outcome: Not Progressing Goal: Cardiovascular complication will be avoided Outcome: Not Progressing   Problem: Activity: Goal: Risk for activity intolerance will decrease Outcome: Not  Progressing   Problem: Nutrition: Goal: Adequate nutrition will be maintained Outcome: Not Progressing   Problem: Coping: Goal: Level of anxiety will decrease Outcome: Not Progressing   Problem: Elimination: Goal: Will not experience complications related to bowel motility Outcome: Not Progressing Goal: Will not experience complications related to urinary retention Outcome: Not Progressing   Problem: Pain Managment: Goal: General experience of comfort will improve Outcome: Not Progressing   Problem: Safety: Goal: Ability to remain free from injury will improve Outcome: Not Progressing   Problem: Skin Integrity: Goal: Risk for impaired skin integrity will decrease Outcome: Not Progressing   Problem: Education: Goal: Knowledge of General Education information will improve Description: Including pain rating scale, medication(s)/side effects and non-pharmacologic comfort measures Outcome: Not Progressing   Problem: Health Behavior/Discharge Planning: Goal: Ability to manage health-related needs will improve Outcome: Not Progressing   Problem: Clinical Measurements: Goal: Ability to maintain clinical measurements within normal limits will improve Outcome: Not Progressing Goal: Will remain free from infection Outcome: Not Progressing Goal: Diagnostic test results will improve Outcome: Not Progressing Goal: Respiratory complications will improve Outcome: Not Progressing Goal: Cardiovascular complication will be avoided Outcome: Not Progressing   Problem: Activity: Goal: Risk for activity intolerance will decrease Outcome: Not Progressing   Problem: Nutrition: Goal: Adequate nutrition will be maintained Outcome: Not Progressing   Problem: Coping: Goal: Level of anxiety will decrease Outcome: Not Progressing   Problem: Elimination: Goal: Will not experience complications related to bowel motility Outcome: Not Progressing Goal: Will not experience complications  related to urinary retention Outcome: Not Progressing   Problem: Pain Managment: Goal: General experience of comfort will improve Outcome: Not Progressing   Problem: Safety: Goal: Ability to remain free from injury will improve Outcome: Not Progressing   Problem: Skin Integrity: Goal: Risk for impaired skin integrity will decrease Outcome: Not Progressing   Problem: Education: Goal: Knowledge of the prescribed therapeutic regimen will improve Outcome: Not Progressing Goal: Ability to verbalize activity precautions or restrictions will improve Outcome: Not Progressing Goal:  Understanding of discharge needs will improve Outcome: Not Progressing   Problem: Activity: Goal: Ability to perform//tolerate increased activity and mobilize with assistive devices will improve Outcome: Not Progressing
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
## 2023-07-20 NOTE — Progress Notes (Signed)
Occupational Therapy Treatment Patient Details Name: [name] MRN: [id number] DOB: Aug 15, 1966 Today's Date: 07/20/2023   History of present illness Pt is a 57 year old male admitted for evaluation of being unable to get up and ambulate, and not eating for 2 days, admitted for left foot gangrene with osteomyelitis. 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   OT comments  Patient continues to require cues for sequencing and for thoroughness with ADLs. Patient able to donn left post op shoe and right sock and shoe when in supine and requires assistance when sitting. Discharge recommendations continue to be appropriate. Acute OT to continue to follow.       If plan is discharge home, recommend the following:  A little help with walking and/or transfers;A little help with bathing/dressing/bathroom;Assistance with cooking/housework;Assist for transportation   Equipment Recommendations  Other (comment) (defer)    Recommendations for Other Services      Precautions / Restrictions Precautions Precautions: Fall;Other (comment) Precaution Comments: History of TBI with left side weakness and cognitive impairments Required Braces or Orthoses: Other Brace Other Brace: L post op shoe Restrictions Weight Bearing Restrictions: Yes LLE Weight Bearing: Partial weight bearing Other Position/Activity Restrictions: Upgraded to WBAT with postop shoe.       Mobility Bed Mobility Overal bed mobility: Needs Assistance Bed Mobility: Supine to Sit, Sit to Supine     Supine to sit: Supervision Sit to supine: Supervision   General bed mobility comments: cues to initiate    Transfers Overall transfer level: Needs assistance Equipment used: Rolling walker (2 wheels), None Transfers: Sit to/from Stand Sit to Stand: Contact guard assist           General transfer comment: CGA for safety, cues to  use RW correctly     Balance Overall balance assessment: Needs assistance Sitting-balance support: No upper extremity supported, Feet supported Sitting balance-Leahy Scale: Normal Sitting balance - Comments: sitting on EOB   Standing balance support: Single extremity supported, During functional activity Standing balance-Leahy Scale: Fair Standing balance comment: able to stand at sink with one extremity support                           ADL either performed or assessed with clinical judgement   ADL Overall ADL's : Needs assistance/impaired     Grooming: Wash/dry face;Wash/dry hands;Oral care;Brushing hair;Applying deodorant;Supervision/safety;Cueing for sequencing;Standing Grooming Details (indicate cue type and reason): at sink Upper Body Bathing: Supervision/ safety;Cueing for sequencing;Sitting Upper Body Bathing Details (indicate cue type and reason): at sink Lower Body Bathing: Minimal assistance;Sit to/from stand;Cueing for sequencing Lower Body Bathing Details (indicate cue type and reason): assistance with bathing right foot Upper Body Dressing : Set up;Supervision/safety;Sitting Upper Body Dressing Details (indicate cue type and reason): to donn paper scrub top Lower Body Dressing: Minimal assistance;Sitting/lateral leans Lower Body Dressing Details (indicate cue type and reason): assistance with right sock and shoe Toilet Transfer: Contact guard assist;Ambulation;Grab bars;Rolling walker (2 wheels) Toilet Transfer Details (indicate cue type and reason): cues for safety Toileting- Clothing Manipulation and Hygiene: Supervision/safety;Sitting/lateral lean         General ADL Comments: cues for thoroughness    Extremity/Trunk Assessment              Vision       Perception     Praxis      Cognition Arousal: Alert Behavior During Therapy: Impulsive Overall Cognitive  Status: History of cognitive impairments - at baseline                                  General Comments: requires cues for sequencing with ADLs        Exercises      Shoulder Instructions       General Comments      Pertinent Vitals/ Pain       Pain Assessment Pain Assessment: No/denies pain Pain Intervention(s): Monitored during session  Home Living                                          Prior Functioning/Environment              Frequency  Min 1X/week        Progress Toward Goals  OT Goals(current goals can now be found in the care plan section)  Progress towards OT goals: Progressing toward goals  Acute Rehab OT Goals OT Goal Formulation: Patient unable to participate in goal setting Time For Goal Achievement: 07/27/23 Potential to Achieve Goals: Good ADL Goals Pt Will Perform Lower Body Dressing: with modified independence Pt Will Transfer to Toilet: with modified independence Pt Will Perform Toileting - Clothing Manipulation and hygiene: with modified independence Additional ADL Goal #1: Pt will independently call/request assistance when necessary for toileting to increase bladder/bowl continence and increase ability to manage personal/toilet hygiene.  Plan      Co-evaluation                 AM-PAC OT "6 Clicks" Daily Activity     Outcome Measure   Help from another person eating meals?: A Little Help from another person taking care of personal grooming?: A Little Help from another person toileting, which includes using toliet, bedpan, or urinal?: A Little Help from another person bathing (including washing, rinsing, drying)?: A Little Help from another person to put on and taking off regular upper body clothing?: A Little Help from another person to put on and taking off regular lower body clothing?: A Little 6 Click Score: 18    End of Session Equipment Utilized During Treatment: Gait belt;Rolling walker (2 wheels)  OT Visit Diagnosis: Unsteadiness on feet (R26.81);Other  abnormalities of gait and mobility (R26.89);Muscle weakness (generalized) (M62.81);Other symptoms and signs involving cognitive function;Pain   Activity Tolerance Patient tolerated treatment well   Patient Left in bed;with call bell/phone within reach;with bed alarm set   Nurse Communication Mobility status        Time: 0722-0747 OT Time Calculation (min): 25 min  Charges: OT General Charges $OT Visit: 1 Visit OT Treatments $Self Care/Home Management : 23-37 mins  Alfonse Flavors, OTA Acute Rehabilitation Services  Office [phone]   Dewain Penning 07/20/2023, 7:56 AM

## 2023-07-21 LAB — GLUCOSE, CAPILLARY
Glucose-Capillary: 135 mg/dL — ABNORMAL HIGH (ref 70–99)
Glucose-Capillary: 130 mg/dL — ABNORMAL HIGH (ref 70–99)
Glucose-Capillary: 208 mg/dL — ABNORMAL HIGH (ref 70–99)
Glucose-Capillary: 94 mg/dL (ref 70–99)

## 2023-07-21 NOTE — Progress Notes (Signed)
Mobility Specialist: Progress Note   07/21/23 1606  Mobility  Activity Ambulated with assistance in hallway  Level of Assistance Contact guard assist, steadying assist  Assistive Device Front wheel walker;Other (Comment) (HHA)  Distance Ambulated (ft) 550 ft  LLE Weight Bearing WBAT (with post op boot)  Activity Response Tolerated well  Mobility Referral Yes  $Mobility charge 1 Mobility  Mobility Specialist Start Time (ACUTE ONLY) 1335  Mobility Specialist Stop Time (ACUTE ONLY) 1350  Mobility Specialist Time Calculation (min) (ACUTE ONLY) 15 min    Pt was agreeable to mobility session - received in bed. Required some tactile cues but overall able to adhere to instructions for RW use and self-correct. Ambulated ~329ft with RW, then ambulated remaining ~239ft with HHA. Returned to room without fault. Left in bed with all needs met, call bell in reach.   [name] Mobility Specialist Please contact via SecureChat or Rehab office at [phone]
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
## 2023-07-21 NOTE — Plan of Care (Signed)
Problem: Education: Goal: Ability to describe self-care measures that may prevent or decrease complications (Diabetes Survival Skills Education) will improve Outcome: Not Progressing Goal: Individualized Educational Video(s) Outcome: Not Progressing   Problem: Coping: Goal: Ability to adjust to condition or change in health will improve Outcome: Not Progressing   Problem: Health Behavior/Discharge Planning: Goal: Ability to identify and utilize available resources and services will improve Outcome: Not Progressing Goal: Ability to manage health-related needs will improve Outcome: Not Progressing   Problem: Metabolic: Goal: Ability to maintain appropriate glucose levels will improve Outcome: Not Progressing   Problem: Nutritional: Goal: Progress toward achieving an optimal weight will improve Outcome: Not Progressing   Problem: Skin Integrity: Goal: Risk for impaired skin integrity will decrease Outcome: Not Progressing   Problem: Tissue Perfusion: Goal: Adequacy of tissue perfusion will improve Outcome: Not Progressing   Problem: Education: Goal: Knowledge of General Education information will improve Description: Including pain rating scale, medication(s)/side effects and non-pharmacologic comfort measures Outcome: Not Progressing   Problem: Health Behavior/Discharge Planning: Goal: Ability to manage health-related needs will improve Outcome: Not Progressing   Problem: Clinical Measurements: Goal: Ability to maintain clinical measurements within normal limits will improve Outcome: Not Progressing Goal: Will remain free from infection Outcome: Not Progressing Goal: Diagnostic test results will improve Outcome: Not Progressing Goal: Respiratory complications will improve Outcome: Not Progressing Goal: Cardiovascular complication will be avoided Outcome: Not Progressing   Problem: Activity: Goal: Risk for activity intolerance will decrease Outcome: Not  Progressing   Problem: Nutrition: Goal: Adequate nutrition will be maintained Outcome: Not Progressing   Problem: Coping: Goal: Level of anxiety will decrease Outcome: Not Progressing   Problem: Elimination: Goal: Will not experience complications related to bowel motility Outcome: Not Progressing Goal: Will not experience complications related to urinary retention Outcome: Not Progressing   Problem: Pain Managment: Goal: General experience of comfort will improve Outcome: Not Progressing   Problem: Safety: Goal: Ability to remain free from injury will improve Outcome: Not Progressing   Problem: Skin Integrity: Goal: Risk for impaired skin integrity will decrease Outcome: Not Progressing   Problem: Education: Goal: Knowledge of General Education information will improve Description: Including pain rating scale, medication(s)/side effects and non-pharmacologic comfort measures Outcome: Not Progressing   Problem: Health Behavior/Discharge Planning: Goal: Ability to manage health-related needs will improve Outcome: Not Progressing   Problem: Clinical Measurements: Goal: Ability to maintain clinical measurements within normal limits will improve Outcome: Not Progressing Goal: Will remain free from infection Outcome: Not Progressing Goal: Diagnostic test results will improve Outcome: Not Progressing Goal: Respiratory complications will improve Outcome: Not Progressing Goal: Cardiovascular complication will be avoided Outcome: Not Progressing   Problem: Activity: Goal: Risk for activity intolerance will decrease Outcome: Not Progressing   Problem: Nutrition: Goal: Adequate nutrition will be maintained Outcome: Not Progressing   Problem: Coping: Goal: Level of anxiety will decrease Outcome: Not Progressing   Problem: Elimination: Goal: Will not experience complications related to bowel motility Outcome: Not Progressing Goal: Will not experience complications  related to urinary retention Outcome: Not Progressing   Problem: Pain Managment: Goal: General experience of comfort will improve Outcome: Not Progressing   Problem: Safety: Goal: Ability to remain free from injury will improve Outcome: Not Progressing   Problem: Skin Integrity: Goal: Risk for impaired skin integrity will decrease Outcome: Not Progressing   Problem: Education: Goal: Knowledge of the prescribed therapeutic regimen will improve Outcome: Not Progressing Goal: Ability to verbalize activity precautions or restrictions will improve Outcome: Not Progressing Goal:  Understanding of discharge needs will improve Outcome: Not Progressing   Problem: Activity: Goal: Ability to perform//tolerate increased activity and mobilize with assistive devices will improve Outcome: Not Progressing

## 2023-07-21 NOTE — Plan of Care (Signed)
  Problem: Education: Goal: Ability to describe self-care measures that may prevent or decrease complications (Diabetes Survival Skills Education) will improve Outcome: Progressing   Problem: Skin Integrity: Goal: Risk for impaired skin integrity will decrease Outcome: Progressing   Problem: Pain Managment: Goal: General experience of comfort will improve Outcome: Progressing   Problem: Safety: Goal: Ability to remain free from injury will improve Outcome: Progressing   Problem: Skin Integrity: Goal: Risk for impaired skin integrity will decrease Outcome: Progressing

## 2023-07-21 NOTE — TOC Progression Note (Addendum)
Transition of Care [facility]) - Progression Note    Patient Details  Name: [name] MRN: [id number] Date of Birth: 1966/04/05  [facility] CM/SW Contact  Lorri Frederick, LCSW Phone Number: 07/21/2023, 1:48 PM  Clinical Narrative:   Harmon Dun APS.  Her administration is discussing the next steps for this case with regards to placement.  The court date was continued to 9/24.  DSS is seeking permission to act on pt behalf to locate placement.  Again, this is not guardianship.  She will call with more updates when she has them.   Sister [name] in room, updated her.    Expected Discharge Plan: Home w Home Health Services Barriers to Discharge: No Barriers Identified  Expected Discharge Plan and Services       Living arrangements for the past 2 months: Single Family Home                                       Social Determinants of Health (SDOH) Interventions SDOH Screenings   Depression (PHQ2-9): Low Risk  (03/11/2023)  Tobacco Use: Low Risk  (06/02/2023)    Readmission Risk Interventions     No data to display

## 2023-07-22 LAB — GLUCOSE, CAPILLARY
Glucose-Capillary: 148 mg/dL — ABNORMAL HIGH (ref 70–99)
Glucose-Capillary: 110 mg/dL — ABNORMAL HIGH (ref 70–99)
Glucose-Capillary: 90 mg/dL (ref 70–99)
Glucose-Capillary: 97 mg/dL (ref 70–99)

## 2023-07-22 NOTE — Plan of Care (Signed)
  Problem: Health Behavior/Discharge Planning: Goal: Ability to identify and utilize available resources and services will improve Outcome: Progressing   Problem: Metabolic: Goal: Ability to maintain appropriate glucose levels will improve Outcome: Progressing   Problem: Skin Integrity: Goal: Risk for impaired skin integrity will decrease Outcome: Progressing   Problem: Tissue Perfusion: Goal: Adequacy of tissue perfusion will improve Outcome: Progressing   Problem: Activity: Goal: Risk for activity intolerance will decrease Outcome: Progressing

## 2023-07-22 NOTE — Progress Notes (Signed)
[facility]                                                                               [address] [name], is a 57 y.o. 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 He underwent left TMA with wound VAC by Dr. Lajoyce Corners 05/05/2023. Therapy recommended home health but patient has no safe disposition. Per sister's report to [facility], "he was staying with some people because he couldn't stay with sister anymore due to violent behaviors. Sister was bringing food and they were letting him stay for free. And now those people are saying he can't come back and sister can't take him either". TOC filed APS report. Now a placement issue. Wound VAC has been removed, suture remove 9/09.    Assessment & Plan    Assessment and Plan:   Left foot cellulitis with gangrene and osteomyelitis Vascular surgery consulted suggested no indication for vascular intervention S/p left TMA and wound VAC placement by Dr. Concha Se for discharge   History of TBI/cognitive impairment and intermittent aggressive behaviors/depression Psychiatry consulted appreciate recommendations unfortunately patient does not currently have capacity. Continue with Risperdal, Prozac and hydroxyzine. No new complaints.     Type 2 diabetes mellitus with hyperglycemia Continue with sliding scale. CBG (last 3)  Recent Labs    07/22/23 0729 07/22/23 1138 07/22/23 1608  GLUCAP 148* 110* 90   No changes in meds.     Unsafe living situation TOC on board for safe disposition APS is also on board   Anemia of chronic disease Monitor H&H  intermittently Hemoglobin around 10.6 today.    History of seizures Continue with Keppra 500 mg twice daily   Essential hypertension Blood pressure parameters are  optimal.    Hyponatremia Resolved         RN Pressure Injury Documentation:    Malnutrition Type:  Nutrition Problem: Increased nutrient needs Etiology: acute illness (L foot gangrene with osteomyelitis; wound vac)   Malnutrition Characteristics:  Signs/Symptoms: estimated needs   Nutrition Interventions:  Interventions: Refer to RD note for recommendations, Ensure Enlive (each supplement provides 350kcal and 20 grams of protein), Juven, MVI  Estimated body mass index is 22.02 kg/m as calculated from the following:   Height as of 03/11/23: 5\' 2"  (1.575 m).   Weight as of this encounter: 54.6 kg.  Code Status: Full code DVT Prophylaxis:  SCD's Start: 06/02/23 1515 enoxaparin (LOVENOX) injection 40 mg Start: 05/27/23 1000   Level of Care: Level of care: Med-Surg Family Communication: None at bedside  Disposition Plan:     Remains inpatient appropriate: Safe disposition  Procedures:  None.   Consultants:   None.   Antimicrobials:   Anti-infectives (From admission, onward)    Start     Dose/Rate Route Frequency Ordered Stop   06/03/23  0600  ceFAZolin (ANCEF) IVPB 2g/100 mL premix        2 g 200 mL/hr over 30 Minutes Intravenous On call to O.R. 06/02/23 1149 06/02/23 1330   06/02/23 2200  ceFAZolin (ANCEF) IVPB 2g/100 mL premix        2 g 200 mL/hr over 30 Minutes Intravenous Every 8 hours 06/02/23 1514 06/03/23 0639   06/02/23 1142  ceFAZolin (ANCEF) 2-4 GM/100ML-% IVPB       Note to Pharmacy: [facility], [name]: cabinet override      06/02/23 1142 06/02/23 1330   06/01/23 1715  metroNIDAZOLE (FLAGYL) IVPB 500 mg  Status:  Discontinued        500 mg 100 mL/hr over 60 Minutes Intravenous Every 12 hours 06/01/23 1615 06/02/23 1515   05/30/23 0800  ceFAZolin (ANCEF) IVPB 2g/100 mL premix         2 g 200 mL/hr over 30 Minutes Intravenous On call to O.R. 05/30/23 0412 05/30/23 1209   05/28/23 1500  vancomycin (VANCOREADY) IVPB 1250 mg/250 mL  Status:  Discontinued        1,250 mg 166.7 mL/hr over 90 Minutes Intravenous Every 24 hours 05/28/23 1211 06/02/23 1515   05/28/23 1145  vancomycin (VANCOREADY) IVPB 1250 mg/250 mL  Status:  Discontinued        1,250 mg 166.7 mL/hr over 90 Minutes Intravenous Every 24 hours 05/28/23 1051 05/28/23 1211   05/26/23 2030  cefTRIAXone (ROCEPHIN) 2 g in sodium chloride 0.9 % 100 mL IVPB        2 g 200 mL/hr over 30 Minutes Intravenous Every 24 hours 05/26/23 2026 06/01/23 1845        Medications  Scheduled Meds:  vitamin C  1,000 mg Oral Daily   atorvastatin  10 mg Oral Daily   enoxaparin (LOVENOX) injection  40 mg Subcutaneous Daily   feeding supplement  237 mL Oral BID BM   FLUoxetine  30 mg Oral Daily   insulin aspart  0-5 Units Subcutaneous QHS   insulin aspart  0-9 Units Subcutaneous TID WC   insulin glargine-yfgn  10 Units Subcutaneous QHS   levETIRAcetam  500 mg Oral BID   multivitamins with iron  1 tablet Oral Daily   nutrition supplement (JUVEN)  1 packet Oral BID BM   pantoprazole  40 mg Oral Daily   risperiDONE  0.25 mg Oral QHS   Continuous Infusions: PRN Meds:.acetaminophen **OR** acetaminophen, alum & mag hydroxide-simeth, bisacodyl, loperamide, ondansetron **OR** ondansetron (ZOFRAN) IV, polyethylene glycol    Subjective:   [name] was seen and examined today.  No new complaints.   Objective:   Vitals:   07/21/23 2005 07/22/23 0339 07/22/23 0731 07/22/23 1420  BP: 110/67 108/69 106/66 117/67  Pulse: 73 78 73 73  Resp: 18 18 17 17   Temp: 98.3 F (36.8 C) 98.5 F (36.9 C) 98.4 F (36.9 C) 98.2 F (36.8 C)  TempSrc:  Oral Oral Oral  SpO2: 96% 96% 95% 99%  Weight:        Intake/Output Summary (Last 24 hours) at 07/22/2023 1827 Last data filed at 07/22/2023 1355 Gross per 24 hour  Intake  240 ml  Output 525 ml  Net -285 ml   Filed Weights   05/26/23 2321  Weight: 54.6 kg     Exam  General exam: Appears calm and comfortable  Respiratory system: Clear to auscultation. Respiratory effort normal. Cardiovascular system: S1 & S2 heard, RRR.  Gastrointestinal system: Abdomen is nondistended, soft and nontender.  Central nervous system: Alert and oriented.  Skin: No rashes,  Psychiatry:  Mood & affect appropriate.     Data Reviewed:  I have personally reviewed following labs and imaging studies   CBC Lab Results  Component Value Date   WBC 5.2 07/21/2023   RBC 3.48 (L) 07/21/2023   HGB 10.6 (L) 07/21/2023   HCT 30.9 (L) 07/21/2023   MCV 88.8 07/21/2023   MCH 30.5 07/21/2023   PLT 209 07/21/2023   MCHC 34.3 07/21/2023   RDW 12.9 07/21/2023   LYMPHSABS 1.1 06/16/2023   MONOABS 0.4 06/16/2023   EOSABS 0.1 06/16/2023   BASOSABS 0.0 06/16/2023     Last metabolic panel Lab Results  Component Value Date   NA 135 07/21/2023   K 3.9 07/21/2023   CL 104 07/21/2023   CO2 25 07/21/2023   BUN 35 (H) 07/21/2023   CREATININE 1.07 07/21/2023   GLUCOSE 112 (H) 07/21/2023   GFRNONAA >60 07/21/2023   GFRAA >60 01/02/2020   CALCIUM 8.6 (L) 07/21/2023   PHOS 3.7 06/12/2023   PROT 6.9 05/26/2023   ALBUMIN 2.4 (L) 06/07/2023   LABGLOB 2.5 04/20/2019   AGRATIO 1.6 04/20/2019   BILITOT 0.4 05/26/2023   ALKPHOS 59 05/26/2023   AST 14 (L) 05/26/2023   ALT 17 05/26/2023   ANIONGAP 6 07/21/2023    CBG (last 3)  Recent Labs    07/22/23 0729 07/22/23 1138 07/22/23 1608  GLUCAP 148* 110* 90      Coagulation Profile: No results for input(s): "INR", "PROTIME" in the last 168 hours.   Radiology Studies: No results found.     Kathlen Mody M.D. [facility] 07/22/2023, 6:27 PM  Available via Epic secure chat 7am-7pm After 7 pm, please refer to night coverage provider listed on amion.

## 2023-07-22 NOTE — Progress Notes (Signed)
Mobility Specialist: Progress Note   07/22/23 1636  Mobility  Activity Ambulated with assistance in hallway  Level of Assistance Contact guard assist, steadying assist  Assistive Device Front wheel walker  Distance Ambulated (ft) 550 ft  LLE Weight Bearing WBAT (with post op shoe)  Activity Response Tolerated well  Mobility Referral Yes  $Mobility charge 1 Mobility  Mobility Specialist Start Time (ACUTE ONLY) 1600  Mobility Specialist Stop Time (ACUTE ONLY) 1615  Mobility Specialist Time Calculation (min) (ACUTE ONLY) 15 min    Pt was agreeable to mobility session - received in bed. Sister present and able to help translate. Required less verbal and tactile cues for RW management and gait correction. Ambulated ~329ft with RW and completed remaining ~268ft with HHA. Returned to room without fault. Left in bed with all needs met, call bell in reach. Sister in room.   [name] Mobility Specialist Please contact via SecureChat or Rehab office at [phone]

## 2023-07-22 NOTE — Plan of Care (Signed)
  Problem: Education: Goal: Knowledge of General Education information will improve Description: Including pain rating scale, medication(s)/side effects and non-pharmacologic comfort measures Outcome: Progressing   Problem: Activity: Goal: Risk for activity intolerance will decrease Outcome: Progressing   Problem: Nutrition: Goal: Adequate nutrition will be maintained Outcome: Progressing   

## 2023-07-23 LAB — GLUCOSE, CAPILLARY
Glucose-Capillary: 127 mg/dL — ABNORMAL HIGH (ref 70–99)
Glucose-Capillary: 107 mg/dL — ABNORMAL HIGH (ref 70–99)
Glucose-Capillary: 113 mg/dL — ABNORMAL HIGH (ref 70–99)
Glucose-Capillary: 101 mg/dL — ABNORMAL HIGH (ref 70–99)

## 2023-07-23 NOTE — Plan of Care (Signed)
  Problem: Health Behavior/Discharge Planning: Goal: Ability to manage health-related needs will improve Outcome: Progressing   Problem: Skin Integrity: Goal: Risk for impaired skin integrity will decrease Outcome: Progressing   Problem: Tissue Perfusion: Goal: Adequacy of tissue perfusion will improve Outcome: Progressing   Problem: Education: Goal: Knowledge of General Education information will improve Description: Including pain rating scale, medication(s)/side effects and non-pharmacologic comfort measures Outcome: Progressing

## 2023-07-23 NOTE — Plan of Care (Signed)
Problem: Education: Goal: Ability to describe self-care measures that may prevent or decrease complications (Diabetes Survival Skills Education) will improve Outcome: Progressing Goal: Individualized Educational Video(s) Outcome: Progressing   Problem: Coping: Goal: Ability to adjust to condition or change in health will improve Outcome: Progressing   Problem: Health Behavior/Discharge Planning: Goal: Ability to identify and utilize available resources and services will improve Outcome: Progressing Goal: Ability to manage health-related needs will improve Outcome: Progressing   Problem: Metabolic: Goal: Ability to maintain appropriate glucose levels will improve Outcome: Progressing   Problem: Nutritional: Goal: Progress toward achieving an optimal weight will improve Outcome: Progressing   Problem: Skin Integrity: Goal: Risk for impaired skin integrity will decrease Outcome: Progressing   Problem: Tissue Perfusion: Goal: Adequacy of tissue perfusion will improve Outcome: Progressing   Problem: Education: Goal: Knowledge of General Education information will improve Description: Including pain rating scale, medication(s)/side effects and non-pharmacologic comfort measures Outcome: Progressing   Problem: Health Behavior/Discharge Planning: Goal: Ability to manage health-related needs will improve Outcome: Progressing   Problem: Clinical Measurements: Goal: Ability to maintain clinical measurements within normal limits will improve Outcome: Progressing Goal: Will remain free from infection Outcome: Progressing Goal: Diagnostic test results will improve Outcome: Progressing Goal: Respiratory complications will improve Outcome: Progressing Goal: Cardiovascular complication will be avoided Outcome: Progressing   Problem: Activity: Goal: Risk for activity intolerance will decrease Outcome: Progressing   Problem: Nutrition: Goal: Adequate nutrition will be  maintained Outcome: Progressing   Problem: Coping: Goal: Level of anxiety will decrease Outcome: Progressing   Problem: Elimination: Goal: Will not experience complications related to bowel motility Outcome: Progressing Goal: Will not experience complications related to urinary retention Outcome: Progressing   Problem: Pain Managment: Goal: General experience of comfort will improve Outcome: Progressing   Problem: Safety: Goal: Ability to remain free from injury will improve Outcome: Progressing   Problem: Skin Integrity: Goal: Risk for impaired skin integrity will decrease Outcome: Progressing   Problem: Education: Goal: Knowledge of General Education information will improve Description: Including pain rating scale, medication(s)/side effects and non-pharmacologic comfort measures Outcome: Progressing   Problem: Health Behavior/Discharge Planning: Goal: Ability to manage health-related needs will improve Outcome: Progressing   Problem: Clinical Measurements: Goal: Ability to maintain clinical measurements within normal limits will improve Outcome: Progressing Goal: Will remain free from infection Outcome: Progressing Goal: Diagnostic test results will improve Outcome: Progressing Goal: Respiratory complications will improve Outcome: Progressing Goal: Cardiovascular complication will be avoided Outcome: Progressing   Problem: Activity: Goal: Risk for activity intolerance will decrease Outcome: Progressing   Problem: Nutrition: Goal: Adequate nutrition will be maintained Outcome: Progressing   Problem: Coping: Goal: Level of anxiety will decrease Outcome: Progressing   Problem: Elimination: Goal: Will not experience complications related to bowel motility Outcome: Progressing Goal: Will not experience complications related to urinary retention Outcome: Progressing   Problem: Pain Managment: Goal: General experience of comfort will improve Outcome:  Progressing   Problem: Safety: Goal: Ability to remain free from injury will improve Outcome: Progressing   Problem: Skin Integrity: Goal: Risk for impaired skin integrity will decrease Outcome: Progressing   Problem: Education: Goal: Knowledge of the prescribed therapeutic regimen will improve Outcome: Progressing Goal: Ability to verbalize activity precautions or restrictions will improve Outcome: Progressing Goal: Understanding of discharge needs will improve Outcome: Progressing   Problem: Activity: Goal: Ability to perform//tolerate increased activity and mobilize with assistive devices will improve Outcome: Progressing   Problem: Clinical Measurements: Goal: Postoperative complications will be avoided or minimized Outcome:  Progressing   Problem: Self-Care: Goal: Ability to meet self-care needs will improve Outcome: Progressing

## 2023-07-23 NOTE — Progress Notes (Signed)
[facility]                                                                               [address] [name], is a 57 y.o. 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 He underwent left TMA with wound VAC by Dr. Lajoyce Berg 05/05/2023. Therapy recommended home health but patient has no safe disposition. Per sister's report to [facility], "he was staying with some people because he couldn't stay with sister anymore due to violent behaviors. Sister was bringing food and they were letting him stay for free. And now those people are saying he can't come back and sister can't take him either". TOC filed APS report. Now a placement issue. Wound VAC has been removed, suture remove 9/09.    Assessment & Plan    Assessment and Plan:   Left foot cellulitis with gangrene and osteomyelitis Vascular surgery consulted suggested no indication for vascular intervention S/p left TMA and wound VAC placement by Dr. Lajoyce Berg. LLE weight bearing as tolerated.  Stable for discharge   History of TBI/cognitive impairment and intermittent aggressive behaviors/depression Psychiatry consulted appreciate recommendations unfortunately patient does not currently have capacity. Continue with Risperdal, Prozac and hydroxyzine. No new complaints.     Type 2 diabetes mellitus with hyperglycemia Continue with sliding scale. CBG (last 3)  Recent Labs    07/22/23 2056 07/23/23 0715 07/23/23 1119  GLUCAP 97 127* 107*   No changes in meds.     Unsafe living situation TOC on board for safe disposition APS is also on board   Anemia of  chronic disease Monitor H&H intermittently Hemoglobin around 10.6 today.    History of seizures Continue with Keppra 500 mg twice daily   Essential hypertension Blood pressure parameters are well controlled.    Hyponatremia Resolved         RN Pressure Injury Documentation:    Malnutrition Type:  Nutrition Problem: Increased nutrient needs Etiology: acute illness (L foot gangrene with osteomyelitis; wound vac)   Malnutrition Characteristics:  Signs/Symptoms: estimated needs   Nutrition Interventions:  Interventions: Refer to RD note for recommendations, Ensure Enlive (each supplement provides 350kcal and 20 grams of protein), Juven, MVI  Estimated body mass index is 22.02 kg/m as calculated from the following:   Height as of 03/11/23: 5\' 2"  (1.575 m).   Weight as of this encounter: 54.6 kg.  Code Status: Full code DVT Prophylaxis:  SCD's Start: 06/02/23 1515 enoxaparin (LOVENOX) injection 40 mg Start: 05/27/23 1000   Level of Care: Level of care: Med-Surg Family Communication: None at bedside  Disposition Plan:     Remains inpatient appropriate: Safe disposition  Procedures:  None.   Consultants:   None.   Antimicrobials:   Anti-infectives (From admission, onward)    Start     Dose/Rate Route  Frequency Ordered Stop   06/03/23 0600  ceFAZolin (ANCEF) IVPB 2g/100 mL premix        2 g 200 mL/hr over 30 Minutes Intravenous On call to O.R. 06/02/23 1149 06/02/23 1330   06/02/23 2200  ceFAZolin (ANCEF) IVPB 2g/100 mL premix        2 g 200 mL/hr over 30 Minutes Intravenous Every 8 hours 06/02/23 1514 06/03/23 0639   06/02/23 1142  ceFAZolin (ANCEF) 2-4 GM/100ML-% IVPB       Note to Pharmacy: [facility], [name]: cabinet override      06/02/23 1142 06/02/23 1330   06/01/23 1715  metroNIDAZOLE (FLAGYL) IVPB 500 mg  Status:  Discontinued        500 mg 100 mL/hr over 60 Minutes Intravenous Every 12 hours 06/01/23 1615 06/02/23 1515   05/30/23 0800  ceFAZolin  (ANCEF) IVPB 2g/100 mL premix        2 g 200 mL/hr over 30 Minutes Intravenous On call to O.R. 05/30/23 0412 05/30/23 1209   05/28/23 1500  vancomycin (VANCOREADY) IVPB 1250 mg/250 mL  Status:  Discontinued        1,250 mg 166.7 mL/hr over 90 Minutes Intravenous Every 24 hours 05/28/23 1211 06/02/23 1515   05/28/23 1145  vancomycin (VANCOREADY) IVPB 1250 mg/250 mL  Status:  Discontinued        1,250 mg 166.7 mL/hr over 90 Minutes Intravenous Every 24 hours 05/28/23 1051 05/28/23 1211   05/26/23 2030  cefTRIAXone (ROCEPHIN) 2 g in sodium chloride 0.9 % 100 mL IVPB        2 g 200 mL/hr over 30 Minutes Intravenous Every 24 hours 05/26/23 2026 06/01/23 1845        Medications  Scheduled Meds:  vitamin C  1,000 mg Oral Daily   atorvastatin  10 mg Oral Daily   enoxaparin (LOVENOX) injection  40 mg Subcutaneous Daily   feeding supplement  237 mL Oral BID BM   FLUoxetine  30 mg Oral Daily   insulin aspart  0-5 Units Subcutaneous QHS   insulin aspart  0-9 Units Subcutaneous TID WC   insulin glargine-yfgn  10 Units Subcutaneous QHS   levETIRAcetam  500 mg Oral BID   multivitamins with iron  1 tablet Oral Daily   nutrition supplement (JUVEN)  1 packet Oral BID BM   pantoprazole  40 mg Oral Daily   risperiDONE  0.25 mg Oral QHS   Continuous Infusions: PRN Meds:.acetaminophen **OR** acetaminophen, alum & mag hydroxide-simeth, bisacodyl, loperamide, ondansetron **OR** ondansetron (ZOFRAN) IV, polyethylene glycol    Subjective:   [name] was seen and examined today.  No new complaints.   Objective:   Vitals:   07/22/23 1420 07/22/23 2057 07/23/23 0505 07/23/23 0717  BP: 117/67 114/76 101/60 117/71  Pulse: 73 68 79 71  Resp: 17 16 14 16   Temp: 98.2 F (36.8 C) 97.8 F (36.6 C) 98.8 F (37.1 C) 98.2 F (36.8 C)  TempSrc: Oral Oral Oral Oral  SpO2: 99% 98% 98% 98%  Weight:        Intake/Output Summary (Last 24 hours) at 07/23/2023 1152 Last data filed at  07/23/2023 0730 Gross per 24 hour  Intake 480 ml  Output 2200 ml  Net -1720 ml   Filed Weights   05/26/23 2321  Weight: 54.6 kg     Exam  General exam: Appears calm and comfortable  Respiratory system: Clear to auscultation. Respiratory effort normal. Cardiovascular system: S1 & S2 heard, RRR.  Gastrointestinal system:  Abdomen is nondistended, soft and nontender. Central nervous system: Alert and oriented to place and person.  Extremities:      Data Reviewed:  I have personally reviewed following labs and imaging studies   CBC Lab Results  Component Value Date   WBC 5.2 07/21/2023   RBC 3.48 (L) 07/21/2023   HGB 10.6 (L) 07/21/2023   HCT 30.9 (L) 07/21/2023   MCV 88.8 07/21/2023   MCH 30.5 07/21/2023   PLT 209 07/21/2023   MCHC 34.3 07/21/2023   RDW 12.9 07/21/2023   LYMPHSABS 1.1 06/16/2023   MONOABS 0.4 06/16/2023   EOSABS 0.1 06/16/2023   BASOSABS 0.0 06/16/2023     Last metabolic panel Lab Results  Component Value Date   NA 135 07/21/2023   K 3.9 07/21/2023   CL 104 07/21/2023   CO2 25 07/21/2023   BUN 35 (H) 07/21/2023   CREATININE 1.07 07/21/2023   GLUCOSE 112 (H) 07/21/2023   GFRNONAA >60 07/21/2023   GFRAA >60 01/02/2020   CALCIUM 8.6 (L) 07/21/2023   PHOS 3.7 06/12/2023   PROT 6.9 05/26/2023   ALBUMIN 2.4 (L) 06/07/2023   LABGLOB 2.5 04/20/2019   AGRATIO 1.6 04/20/2019   BILITOT 0.4 05/26/2023   ALKPHOS 59 05/26/2023   AST 14 (L) 05/26/2023   ALT 17 05/26/2023   ANIONGAP 6 07/21/2023    CBG (last 3)  Recent Labs    07/22/23 2056 07/23/23 0715 07/23/23 1119  GLUCAP 97 127* 107*      Coagulation Profile: No results for input(s): "INR", "PROTIME" in the last 168 hours.   Radiology Studies: No results found.     Kathlen Mody M.D. [facility] 07/23/2023, 11:52 AM  Available via Epic secure chat 7am-7pm After 7 pm, please refer to night coverage provider listed on amion.

## 2023-07-23 NOTE — Progress Notes (Signed)
Occupational Therapy Treatment Patient Details Name: [name] MRN: [id number] DOB: 1966-02-13 Today's Date: 07/23/2023   History of present illness Pt is a 57 year old male admitted for evaluation of being unable to get up and ambulate, and not eating for 2 days, admitted for left foot gangrene with osteomyelitis. 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   OT comments  Patient continues to demonstrate gains with OT treatment. Patient progressed with LB bathing/dressing with CGA from min assist.  Patient continues to require frequent cues for safety. Acute OT to continue to follow.       If plan is discharge home, recommend the following:  A little help with walking and/or transfers;A little help with bathing/dressing/bathroom;Assistance with cooking/housework;Assist for transportation   Equipment Recommendations  Other (comment) (defer)    Recommendations for Other Services      Precautions / Restrictions Precautions Precautions: Fall;Other (comment) Precaution Comments: History of TBI with left side weakness and cognitive impairments Required Braces or Orthoses: Other Brace Other Brace: L post op shoe Restrictions Weight Bearing Restrictions: Yes LLE Weight Bearing: Weight bearing as tolerated Other Position/Activity Restrictions: Upgraded to WBAT with postop shoe.       Mobility Bed Mobility Overal bed mobility: Needs Assistance Bed Mobility: Supine to Sit, Sit to Supine     Supine to sit: Supervision Sit to supine: Modified independent (Device/Increase time)   General bed mobility comments: cues to initiate for supine to sit    Transfers Overall transfer level: Needs assistance Equipment used: None, 1 person hand held assist Transfers: Sit to/from Stand Sit to Stand: Contact guard assist           General transfer comment: CGA for safety with sit to stands  and transfer with HHA     Balance Overall balance assessment: Needs assistance Sitting-balance support: No upper extremity supported, Feet supported Sitting balance-Leahy Scale: Normal Sitting balance - Comments: sitting on EOB   Standing balance support: Single extremity supported, During functional activity Standing balance-Leahy Scale: Fair Standing balance comment: stands at sink with one extremity assist for self care tasks                           ADL either performed or assessed with clinical judgement   ADL Overall ADL's : Needs assistance/impaired     Grooming: Wash/dry face;Wash/dry hands;Oral care;Brushing hair;Applying deodorant;Supervision/safety;Cueing for sequencing;Standing Grooming Details (indicate cue type and reason): at sink Upper Body Bathing: Supervision/ safety;Cueing for sequencing;Sitting Upper Body Bathing Details (indicate cue type and reason): at sink Lower Body Bathing: Contact guard assist;Sit to/from stand;Cueing for sequencing Lower Body Bathing Details (indicate cue type and reason): seated and standing at sink Upper Body Dressing : Set up;Supervision/safety;Sitting Upper Body Dressing Details (indicate cue type and reason): to donn paper scrub top Lower Body Dressing: Contact guard assist;Sit to/from stand Lower Body Dressing Details (indicate cue type and reason): CGA for safety with balance while standing               General ADL Comments: cues for thoroughness    Extremity/Trunk Assessment              Vision       Perception     Praxis      Cognition Arousal: Alert Behavior During Therapy: Impulsive Overall Cognitive Status: History of cognitive impairments - at baseline  General Comments: requires cues for sequencing and for thoroughness for ADLs        Exercises Exercises: General Upper Extremity General Exercises - Upper Extremity Shoulder Flexion:  Strengthening, Both, 10 reps, Seated Shoulder Horizontal ABduction: Strengthening, Both, 10 reps, Seated    Shoulder Instructions       General Comments      Pertinent Vitals/ Pain       Pain Assessment Pain Assessment: No/denies pain Pain Intervention(s): Monitored during session  Home Living                                          Prior Functioning/Environment              Frequency  Min 1X/week        Progress Toward Goals  OT Goals(current goals can now be found in the care plan section)  Progress towards OT goals: Progressing toward goals  Acute Rehab OT Goals OT Goal Formulation: Patient unable to participate in goal setting Time For Goal Achievement: 07/27/23 Potential to Achieve Goals: Good ADL Goals Pt Will Perform Lower Body Dressing: with modified independence Pt Will Transfer to Toilet: with modified independence Pt Will Perform Toileting - Clothing Manipulation and hygiene: with modified independence Additional ADL Goal #1: Pt will independently call/request assistance when necessary for toileting to increase bladder/bowl continence and increase ability to manage personal/toilet hygiene.  Plan      Co-evaluation                 AM-PAC OT "6 Clicks" Daily Activity     Outcome Measure   Help from another person eating meals?: A Little Help from another person taking care of personal grooming?: A Little Help from another person toileting, which includes using toliet, bedpan, or urinal?: A Little Help from another person bathing (including washing, rinsing, drying)?: A Little Help from another person to put on and taking off regular upper body clothing?: A Little Help from another person to put on and taking off regular lower body clothing?: A Little 6 Click Score: 18    End of Session Equipment Utilized During Treatment: Gait belt;Rolling walker (2 wheels)  OT Visit Diagnosis: Unsteadiness on feet (R26.81);Other  abnormalities of gait and mobility (R26.89);Muscle weakness (generalized) (M62.81);Other symptoms and signs involving cognitive function;Pain   Activity Tolerance Patient tolerated treatment well   Patient Left in bed;with call bell/phone within reach;with bed alarm set   Nurse Communication Mobility status        Time: 0722-0746 OT Time Calculation (min): 24 min  Charges: OT General Charges $OT Visit: 1 Visit OT Treatments $Self Care/Home Management : 23-37 mins  Alfonse Flavors, OTA Acute Rehabilitation Services  Office [phone]   Dewain Penning 07/23/2023, 7:56 AM

## 2023-07-23 NOTE — Progress Notes (Signed)
Physical Therapy Treatment Patient Details Name: [name] MRN: [id number] DOB: June 26, 1966 Today's Date: 07/23/2023   History of Present Illness Pt is a 57 year old male admitted for evaluation of being unable to get up and ambulate, and not eating for 2 days, admitted for left foot gangrene with osteomyelitis. 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    PT Comments  Pt and family in room on entry, with exception of sister family members left at beginning of session. With assistance of in-house interpreter Erie Noe, pt able to participate in wayfinding activities. After seated rest break pt participated in higher level balance activities, but requires increased time and mental effort to participate. D/c plans remain appropriate at this time.PT will continue to follow acutely.     If plan is discharge home, recommend the following: A little help with walking and/or transfers;A little help with bathing/dressing/bathroom;Assistance with cooking/housework;Direct supervision/assist for medications management;Assist for transportation;Help with stairs or ramp for entrance   Can travel by private vehicle     No  Equipment Recommendations   (will continue to discern)       Precautions / Restrictions Precautions Precautions: Fall;Other (comment) Precaution Comments: History of TBI with left side weakness and cognitive impairments Required Braces or Orthoses: Other Brace Other Brace: L post op shoe Restrictions Weight Bearing Restrictions: No LLE Weight Bearing: Weight bearing as tolerated Other Position/Activity Restrictions: Upgraded to WBAT with postop shoe.     Mobility  Bed Mobility Overal bed mobility: Needs Assistance Bed Mobility: Supine to Sit, Sit to Supine     Supine to sit: Supervision     General bed mobility comments: despite cuing for coming to EoB for donning shoes pt  reaches over EoB and pick them up from the floor and puts them on before coming to EoB. Pt cued to make sure that Velcro on postop shoe is snug so that shoe will not fall off    Transfers Overall transfer level: Needs assistance Equipment used: Rolling walker (2 wheels) Transfers: Sit to/from Stand Sit to Stand: Contact guard assist           General transfer comment: CGA for safety    Ambulation/Gait Ambulation/Gait assistance: Contact guard assist Gait Distance (Feet): 580 Feet (+320) Assistive device: None Gait Pattern/deviations: Shuffle, Staggering left, Staggering right, Wide base of support Gait velocity: unable to change velocity on command, but baseline is a safer speed Gait velocity interpretation: <1.8 ft/sec, indicate of risk for recurrent falls   General Gait Details: contact guard assist throughout ambulation, worked on way finding on first bout of ambulation trying to find his way to room 15 and back to his room, on second bout of walking worked on higher level balance activities.         Balance Overall balance assessment: Needs assistance Sitting-balance support: No upper extremity supported, Feet supported Sitting balance-Leahy Scale: Normal     Standing balance support: Single extremity supported, During functional activity Standing balance-Leahy Scale: Fair Standing balance comment: looks for support but is able to stabilize with both static and dynamic balance at a contact guard level             High level balance activites: Direction changes, Sudden stops, Side stepping High Level Balance Comments: pt requires increased time and mental effort to perform changes            Cognition Arousal: Alert Behavior During Therapy: Impulsive Overall Cognitive Status:  History of cognitive impairments - at baseline                                 General Comments: hx of cognitive impairment, impulsive, requires cues for safety and to  ask for assistance. needs repeated cues to maintain corrections        Exercises      General Comments General comments (skin integrity, edema, etc.): In-house interpreter Erie Noe used during session, sister present throughout session      Pertinent Vitals/Pain Pain Assessment Pain Assessment: No/denies pain     PT Goals (current goals can now be found in the care plan section) Acute Rehab PT Goals PT Goal Formulation: With patient Time For Goal Achievement: 08/03/23 Potential to Achieve Goals: Good Progress towards PT goals: Progressing toward goals    Frequency    Min 1X/week      PT Plan Current plan remains appropriate       AM-PAC PT "6 Clicks" Mobility   Outcome Measure  Help needed turning from your back to your side while in a flat bed without using bedrails?: None Help needed moving from lying on your back to sitting on the side of a flat bed without using bedrails?: None Help needed moving to and from a bed to a chair (including a wheelchair)?: A Little Help needed standing up from a chair using your arms (e.g., wheelchair or bedside chair)?: A Little Help needed to walk in hospital room?: A Little Help needed climbing 3-5 steps with a railing? : A Little 6 Click Score: 20    End of Session Equipment Utilized During Treatment: Gait belt Activity Tolerance: Patient tolerated treatment well Patient left: with call bell/phone within reach;in chair;with chair alarm set;with family/visitor present Nurse Communication: Mobility status PT Visit Diagnosis: Other abnormalities of gait and mobility (R26.89)     Time: 1329-1400 PT Time Calculation (min) (ACUTE ONLY): 31 min  Charges:    $Therapeutic Activity: 8-22 mins $Self Care/Home Management: 8-22 PT General Charges $$ ACUTE PT VISIT: 1 Visit                     Delenn Ahn B. Beverely Risen PT, DPT Acute Rehabilitation Services Please use secure chat or  Call Office [phone]    [facility]on [facility] 07/23/2023, 4:07 PM

## 2023-07-24 LAB — GLUCOSE, CAPILLARY
Glucose-Capillary: 127 mg/dL — ABNORMAL HIGH (ref 70–99)
Glucose-Capillary: 103 mg/dL — ABNORMAL HIGH (ref 70–99)
Glucose-Capillary: 134 mg/dL — ABNORMAL HIGH (ref 70–99)
Glucose-Capillary: 115 mg/dL — ABNORMAL HIGH (ref 70–99)

## 2023-07-24 NOTE — Progress Notes (Signed)
[facility]                                                                               [address] [name], is a 57 y.o. male, DOB - 03-Jun-1966, EAV:[id number] Admit date - 05/26/2023    Outpatient Primary MD for the patient is Marcine Matar, MD  LOS - 59  days    Brief summary   57 year old with past medical history significant for diabetes TBI with residual MCI with aggressive behaviors and left hemiparesis, seizure disorder and depression was admitted for left foot gangrene with osteomyelitis and started on IV antibiotics. He underwent left TMA with wound VAC by Dr. Lajoyce Corners 05/05/2023. Therapy recommended home health but patient has no safe disposition. Per sister's report to [facility], "he was staying with some people because he couldn't stay with sister anymore due to violent behaviors. Sister was bringing food and they were letting him stay for free. And now those people are saying he can't come back and sister can't take him either". TOC filed APS report. Now a placement issue. Wound VAC has been removed, suture remove 9/09.    Assessment & Plan    Assessment and Plan:   Left foot cellulitis with gangrene and osteomyelitis Vascular surgery consulted suggested no indication for vascular intervention S/p left TMA and wound VAC placement by Dr. Lajoyce Corners. LLE weight bearing as tolerated.  Stable for discharge. Left foot and ankle bandaged. Pt dneies any pain.    History of TBI/cognitive impairment and intermittent aggressive behaviors/depression Psychiatry consulted appreciate recommendations unfortunately patient does not currently have capacity. Continue with Risperdal, Prozac and hydroxyzine. No new complaints.     Type 2 diabetes mellitus with hyperglycemia Continue with sliding scale. CBG (last 3)  Recent Labs    07/24/23 0828 07/24/23 1148 07/24/23 1614  GLUCAP 127* 103* 134*   No changes in meds.     Unsafe living situation TOC on board for safe  disposition APS is also on board   Anemia of chronic disease Monitor H&H intermittently Hemoglobin around 10.6 today.    History of seizures Continue with Keppra 500 mg twice daily   Essential hypertension Blood pressure parameters are optimal. .    Hyponatremia Resolved         RN Pressure Injury Documentation:    Malnutrition Type:  Nutrition Problem: Increased nutrient needs Etiology: acute illness (L foot gangrene with osteomyelitis; wound vac)   Malnutrition Characteristics:  Signs/Symptoms: estimated needs   Nutrition Interventions:  Interventions: Refer to RD note for recommendations, Ensure Enlive (each supplement provides 350kcal and 20 grams of protein), Juven, MVI  Estimated body mass index is 22.02 kg/m as calculated from the following:   Height as of 03/11/23: 5\' 2"  (1.575 m).   Weight as of this encounter: 54.6 kg.  Code Status: Full code DVT Prophylaxis:  SCD's Start: 06/02/23 1515 enoxaparin (LOVENOX) injection 40 mg Start: 05/27/23 1000   Level of Care: Level of care: Med-Surg Family Communication: None at bedside  Disposition Plan:     Remains inpatient appropriate: Safe disposition  Procedures:  S/p left TMA and wound VAC placement by Dr. Lajoyce Corners.   Consultants:   Vascular  surgery Orthopedics.   Antimicrobials:   Anti-infectives (From admission, onward)    Start     Dose/Rate Route Frequency Ordered Stop   06/03/23 0600  ceFAZolin (ANCEF) IVPB 2g/100 mL premix        2 g 200 mL/hr over 30 Minutes Intravenous On call to O.R. 06/02/23 1149 06/02/23 1330   06/02/23 2200  ceFAZolin (ANCEF) IVPB 2g/100 mL premix        2 g 200 mL/hr over 30 Minutes Intravenous Every 8 hours 06/02/23 1514 06/03/23 0639   06/02/23 1142  ceFAZolin (ANCEF) 2-4 GM/100ML-% IVPB       Note to Pharmacy: [facility], [name]: cabinet override      06/02/23 1142 06/02/23 1330   06/01/23 1715  metroNIDAZOLE (FLAGYL) IVPB 500 mg  Status:  Discontinued        500  mg 100 mL/hr over 60 Minutes Intravenous Every 12 hours 06/01/23 1615 06/02/23 1515   05/30/23 0800  ceFAZolin (ANCEF) IVPB 2g/100 mL premix        2 g 200 mL/hr over 30 Minutes Intravenous On call to O.R. 05/30/23 0412 05/30/23 1209   05/28/23 1500  vancomycin (VANCOREADY) IVPB 1250 mg/250 mL  Status:  Discontinued        1,250 mg 166.7 mL/hr over 90 Minutes Intravenous Every 24 hours 05/28/23 1211 06/02/23 1515   05/28/23 1145  vancomycin (VANCOREADY) IVPB 1250 mg/250 mL  Status:  Discontinued        1,250 mg 166.7 mL/hr over 90 Minutes Intravenous Every 24 hours 05/28/23 1051 05/28/23 1211   05/26/23 2030  cefTRIAXone (ROCEPHIN) 2 g in sodium chloride 0.9 % 100 mL IVPB        2 g 200 mL/hr over 30 Minutes Intravenous Every 24 hours 05/26/23 2026 06/01/23 1845        Medications  Scheduled Meds:  vitamin C  1,000 mg Oral Daily   atorvastatin  10 mg Oral Daily   enoxaparin (LOVENOX) injection  40 mg Subcutaneous Daily   feeding supplement  237 mL Oral BID BM   FLUoxetine  30 mg Oral Daily   insulin aspart  0-5 Units Subcutaneous QHS   insulin aspart  0-9 Units Subcutaneous TID WC   insulin glargine-yfgn  10 Units Subcutaneous QHS   levETIRAcetam  500 mg Oral BID   multivitamins with iron  1 tablet Oral Daily   nutrition supplement (JUVEN)  1 packet Oral BID BM   pantoprazole  40 mg Oral Daily   risperiDONE  0.25 mg Oral QHS   Continuous Infusions: PRN Meds:.acetaminophen **OR** acetaminophen, alum & mag hydroxide-simeth, bisacodyl, loperamide, ondansetron **OR** ondansetron (ZOFRAN) IV, polyethylene glycol    Subjective:   Malike Buenafe was seen and examined today.  No new complaints.   Objective:   Vitals:   07/23/23 2034 07/24/23 0113 07/24/23 0640 07/24/23 0830  BP: 117/72 115/76 105/62 110/76  Pulse: 74 68 71 70  Resp: 18 18 17 18   Temp: 98.4 F (36.9 C) 98.3 F (36.8 C) (!) 97.4 F (36.3 C) 98 F (36.7 C)  TempSrc: Oral Oral Oral Oral  SpO2:  96% 95% 93% 95%  Weight:        Intake/Output Summary (Last 24 hours) at 07/24/2023 1702 Last data filed at 07/24/2023 1636 Gross per 24 hour  Intake 600 ml  Output 550 ml  Net 50 ml   Filed Weights   05/26/23 2321  Weight: 54.6 kg     Exam  General exam: Appears  calm and comfortable  Respiratory system: Clear to auscultation. Respiratory effort normal. Cardiovascular system: S1 & S2 heard, RRR.  Gastrointestinal system: Abdomen is nondistended, soft and nontender. Central nervous system: Alert and oriented to place and person.  Extremities: left foot bandaged.       Data Reviewed:  I have personally reviewed following labs and imaging studies   CBC Lab Results  Component Value Date   WBC 5.2 07/21/2023   RBC 3.48 (L) 07/21/2023   HGB 10.6 (L) 07/21/2023   HCT 30.9 (L) 07/21/2023   MCV 88.8 07/21/2023   MCH 30.5 07/21/2023   PLT 209 07/21/2023   MCHC 34.3 07/21/2023   RDW 12.9 07/21/2023   LYMPHSABS 1.1 06/16/2023   MONOABS 0.4 06/16/2023   EOSABS 0.1 06/16/2023   BASOSABS 0.0 06/16/2023     Last metabolic panel Lab Results  Component Value Date   NA 135 07/21/2023   K 3.9 07/21/2023   CL 104 07/21/2023   CO2 25 07/21/2023   BUN 35 (H) 07/21/2023   CREATININE 1.07 07/21/2023   GLUCOSE 112 (H) 07/21/2023   GFRNONAA >60 07/21/2023   GFRAA >60 01/02/2020   CALCIUM 8.6 (L) 07/21/2023   PHOS 3.7 06/12/2023   PROT 6.9 05/26/2023   ALBUMIN 2.4 (L) 06/07/2023   LABGLOB 2.5 04/20/2019   AGRATIO 1.6 04/20/2019   BILITOT 0.4 05/26/2023   ALKPHOS 59 05/26/2023   AST 14 (L) 05/26/2023   ALT 17 05/26/2023   ANIONGAP 6 07/21/2023    CBG (last 3)  Recent Labs    07/24/23 0828 07/24/23 1148 07/24/23 1614  GLUCAP 127* 103* 134*      Coagulation Profile: No results for input(s): "INR", "PROTIME" in the last 168 hours.   Radiology Studies: No results found.     Kathlen Mody M.D. [facility] 07/24/2023, 5:02 PM  Available via Epic  secure chat 7am-7pm After 7 pm, please refer to night coverage provider listed on amion.

## 2023-07-24 NOTE — Plan of Care (Signed)
  Problem: Coping: Goal: Ability to adjust to condition or change in health will improve Outcome: Progressing   Problem: Metabolic: Goal: Ability to maintain appropriate glucose levels will improve Outcome: Progressing   Problem: Clinical Measurements: Goal: Cardiovascular complication will be avoided Outcome: Progressing

## 2023-07-24 NOTE — Plan of Care (Signed)
Progressing

## 2023-07-24 NOTE — Plan of Care (Signed)
Problem: Education: Goal: Ability to describe self-care measures that may prevent or decrease complications (Diabetes Survival Skills Education) will improve Outcome: Progressing   Problem: Coping: Goal: Ability to adjust to condition or change in health will improve Outcome: Progressing   Problem: Metabolic: Goal: Ability to maintain appropriate glucose levels will improve Outcome: Progressing   Problem: Skin Integrity: Goal: Risk for impaired skin integrity will decrease Outcome: Progressing

## 2023-07-25 LAB — GLUCOSE, CAPILLARY
Glucose-Capillary: 184 mg/dL — ABNORMAL HIGH (ref 70–99)
Glucose-Capillary: 86 mg/dL (ref 70–99)
Glucose-Capillary: 120 mg/dL — ABNORMAL HIGH (ref 70–99)
Glucose-Capillary: 112 mg/dL — ABNORMAL HIGH (ref 70–99)

## 2023-07-25 NOTE — Progress Notes (Signed)
[facility]                                                                               [address] [name], is a 57 y.o. male, DOB - 1966/03/30, ZOX:[id number] Admit date - 05/26/2023    Outpatient Primary MD for the patient is Marcine Matar, MD  LOS - 60  days    Brief summary   57 year old with past medical history significant for diabetes TBI with residual MCI with aggressive behaviors and left hemiparesis, seizure disorder and depression was admitted for left foot gangrene with osteomyelitis and started on IV antibiotics. He underwent left TMA with wound VAC by Dr. Lajoyce Corners 05/05/2023. Therapy recommended home health but patient has no safe disposition. Per sister's report to [facility], "he was staying with some people because he couldn't stay with sister anymore due to violent behaviors. Sister was bringing food and they were letting him stay for free. And now those people are saying he can't come back and sister can't take him either". TOC filed APS report. Now a placement issue. Wound VAC has been removed, suture remove 9/09.    Assessment & Plan    Assessment and Plan:   Left foot cellulitis with gangrene and osteomyelitis Vascular surgery consulted suggested no indication for vascular intervention S/p left TMA and wound VAC placement by Dr. Lajoyce Corners. LLE weight bearing as tolerated.  Stable for discharge. Left foot and ankle bandaged. Pt DENIES  any pain.  No new complaints.    History of TBI/cognitive impairment and intermittent aggressive behaviors/depression Psychiatry consulted appreciate recommendations unfortunately patient does not currently have capacity. Continue with Risperdal, Prozac and hydroxyzine. No new complaints.     Type 2 diabetes mellitus with hyperglycemia Continue with sliding scale. CBG (last 3)  Recent Labs    07/25/23 0814 07/25/23 1120 07/25/23 1614  GLUCAP 184* 86 120*   No changes in meds.     Unsafe living  situation TOC on board for safe disposition APS is also on board   Anemia of chronic disease Monitor H&H intermittently Hemoglobin around 10.6 today.    History of seizures Continue with Keppra 500 mg twice daily   Essential hypertension Blood pressure parameters are optimal. .    Hyponatremia Resolved   RN Pressure Injury Documentation:    Malnutrition Type:  Nutrition Problem: Increased nutrient needs Etiology: acute illness (L foot gangrene with osteomyelitis; wound vac)   Malnutrition Characteristics:  Signs/Symptoms: estimated needs   Nutrition Interventions:  Interventions: Refer to RD note for recommendations, Ensure Enlive (each supplement provides 350kcal and 20 grams of protein), Juven, MVI  Estimated body mass index is 22.02 kg/m as calculated from the following:   Height as of 03/11/23: 5\' 2"  (1.575 m).   Weight as of this encounter: 54.6 kg.  Code Status: Full code DVT Prophylaxis:  SCD's Start: 06/02/23 1515 enoxaparin (LOVENOX) injection 40 mg Start: 05/27/23 1000   Level of Care: Level of care: Med-Surg Family Communication: None at bedside  Disposition Plan:     Remains inpatient appropriate: Safe disposition  Procedures:  S/p left TMA and wound VAC placement by Dr. Lajoyce Corners.   Consultants:   Vascular surgery  Orthopedics.   Antimicrobials:   Anti-infectives (From admission, onward)    Start     Dose/Rate Route Frequency Ordered Stop   06/03/23 0600  ceFAZolin (ANCEF) IVPB 2g/100 mL premix        2 g 200 mL/hr over 30 Minutes Intravenous On call to O.R. 06/02/23 1149 06/02/23 1330   06/02/23 2200  ceFAZolin (ANCEF) IVPB 2g/100 mL premix        2 g 200 mL/hr over 30 Minutes Intravenous Every 8 hours 06/02/23 1514 06/03/23 0639   06/02/23 1142  ceFAZolin (ANCEF) 2-4 GM/100ML-% IVPB       Note to Pharmacy: [facility], [name]: cabinet override      06/02/23 1142 06/02/23 1330   06/01/23 1715  metroNIDAZOLE (FLAGYL) IVPB 500 mg  Status:   Discontinued        500 mg 100 mL/hr over 60 Minutes Intravenous Every 12 hours 06/01/23 1615 06/02/23 1515   05/30/23 0800  ceFAZolin (ANCEF) IVPB 2g/100 mL premix        2 g 200 mL/hr over 30 Minutes Intravenous On call to O.R. 05/30/23 0412 05/30/23 1209   05/28/23 1500  vancomycin (VANCOREADY) IVPB 1250 mg/250 mL  Status:  Discontinued        1,250 mg 166.7 mL/hr over 90 Minutes Intravenous Every 24 hours 05/28/23 1211 06/02/23 1515   05/28/23 1145  vancomycin (VANCOREADY) IVPB 1250 mg/250 mL  Status:  Discontinued        1,250 mg 166.7 mL/hr over 90 Minutes Intravenous Every 24 hours 05/28/23 1051 05/28/23 1211   05/26/23 2030  cefTRIAXone (ROCEPHIN) 2 g in sodium chloride 0.9 % 100 mL IVPB        2 g 200 mL/hr over 30 Minutes Intravenous Every 24 hours 05/26/23 2026 06/01/23 1845        Medications  Scheduled Meds:  vitamin C  1,000 mg Oral Daily   atorvastatin  10 mg Oral Daily   enoxaparin (LOVENOX) injection  40 mg Subcutaneous Daily   feeding supplement  237 mL Oral BID BM   FLUoxetine  30 mg Oral Daily   insulin aspart  0-5 Units Subcutaneous QHS   insulin aspart  0-9 Units Subcutaneous TID WC   insulin glargine-yfgn  10 Units Subcutaneous QHS   levETIRAcetam  500 mg Oral BID   multivitamins with iron  1 tablet Oral Daily   nutrition supplement (JUVEN)  1 packet Oral BID BM   pantoprazole  40 mg Oral Daily   risperiDONE  0.25 mg Oral QHS   Continuous Infusions: PRN Meds:.acetaminophen **OR** acetaminophen, alum & mag hydroxide-simeth, bisacodyl, loperamide, ondansetron **OR** ondansetron (ZOFRAN) IV, polyethylene glycol    Subjective:   Brennen Okita was seen and examined today.  No complaints.   Objective:   Vitals:   07/24/23 2031 07/25/23 0515 07/25/23 0817 07/25/23 1502  BP: 120/66 113/64 107/70 101/65  Pulse: 74 62 72 79  Resp: 16 16 18 18   Temp: 98.4 F (36.9 C)  99 F (37.2 C) 98.3 F (36.8 C)  TempSrc: Oral  Oral Oral  SpO2: 97%  97% 99% 96%  Weight:        Intake/Output Summary (Last 24 hours) at 07/25/2023 1839 Last data filed at 07/25/2023 1500 Gross per 24 hour  Intake 480 ml  Output 1775 ml  Net -1295 ml   Filed Weights   05/26/23 2321  Weight: 54.6 kg     Exam  General exam: Appears calm and comfortable  Respiratory system:  Clear to auscultation. Respiratory effort normal. Cardiovascular system: S1 & S2 heard, RRR. No JVD,  Gastrointestinal system: Abdomen is nondistended, soft and nontender.     Data Reviewed:  I have personally reviewed following labs and imaging studies   CBC Lab Results  Component Value Date   WBC 5.2 07/21/2023   RBC 3.48 (L) 07/21/2023   HGB 10.6 (L) 07/21/2023   HCT 30.9 (L) 07/21/2023   MCV 88.8 07/21/2023   MCH 30.5 07/21/2023   PLT 209 07/21/2023   MCHC 34.3 07/21/2023   RDW 12.9 07/21/2023   LYMPHSABS 1.1 06/16/2023   MONOABS 0.4 06/16/2023   EOSABS 0.1 06/16/2023   BASOSABS 0.0 06/16/2023     Last metabolic panel Lab Results  Component Value Date   NA 135 07/21/2023   K 3.9 07/21/2023   CL 104 07/21/2023   CO2 25 07/21/2023   BUN 35 (H) 07/21/2023   CREATININE 1.07 07/21/2023   GLUCOSE 112 (H) 07/21/2023   GFRNONAA >60 07/21/2023   GFRAA >60 01/02/2020   CALCIUM 8.6 (L) 07/21/2023   PHOS 3.7 06/12/2023   PROT 6.9 05/26/2023   ALBUMIN 2.4 (L) 06/07/2023   LABGLOB 2.5 04/20/2019   AGRATIO 1.6 04/20/2019   BILITOT 0.4 05/26/2023   ALKPHOS 59 05/26/2023   AST 14 (L) 05/26/2023   ALT 17 05/26/2023   ANIONGAP 6 07/21/2023    CBG (last 3)  Recent Labs    07/25/23 0814 07/25/23 1120 07/25/23 1614  GLUCAP 184* 86 120*      Coagulation Profile: No results for input(s): "INR", "PROTIME" in the last 168 hours.   Radiology Studies: No results found.     Kathlen Mody M.D. [facility] 07/25/2023, 6:39 PM  Available via Epic secure chat 7am-7pm After 7 pm, please refer to night coverage provider listed on amion.

## 2023-07-25 NOTE — Plan of Care (Signed)
Progressing

## 2023-07-26 LAB — GLUCOSE, CAPILLARY
Glucose-Capillary: 130 mg/dL — ABNORMAL HIGH (ref 70–99)
Glucose-Capillary: 91 mg/dL (ref 70–99)
Glucose-Capillary: 138 mg/dL — ABNORMAL HIGH (ref 70–99)
Glucose-Capillary: 128 mg/dL — ABNORMAL HIGH (ref 70–99)

## 2023-07-26 MED ORDER — GLUCERNA SHAKE PO LIQD
237.0000 mL | Freq: Two times a day (BID) | ORAL | Status: DC
  Administered 2023-07-26 – 2023-09-09 (×90): 237 mL via ORAL
  Filled 2023-07-26: qty 237

## 2023-07-26 NOTE — Plan of Care (Signed)
Problem: Health Behavior/Discharge Planning: Goal: Ability to manage health-related needs will improve Outcome: Progressing   Problem: Metabolic: Goal: Ability to maintain appropriate glucose levels will improve Outcome: Progressing

## 2023-07-26 NOTE — Progress Notes (Signed)
Mobility Specialist: Progress Note   07/26/23 1607  Mobility  Activity Ambulated with assistance in hallway  Level of Assistance Contact guard assist, steadying assist  Assistive Device Other (Comment) (HHA)  Distance Ambulated (ft) 550 ft  LLE Weight Bearing WBAT (with post op boot)  Activity Response Tolerated well  Mobility Referral Yes  $Mobility charge 1 Mobility  Mobility Specialist Start Time (ACUTE ONLY) 1357  Mobility Specialist Stop Time (ACUTE ONLY) 1408  Mobility Specialist Time Calculation (min) (ACUTE ONLY) 11 min    Pt was agreeable to mobility session - received in bed. Interpreter Raquel was present and helpful during session. Pt did not require any verbal or tactile cues this session for gait. Still taking small steps but steadiness has improved; pt is becoming less reliant on UE support for ambulation. Returned to room without fault. Left in bed with all needs met, call bell in reach.   [name] Mobility Specialist Please contact via SecureChat or Rehab office at [phone]

## 2023-07-26 NOTE — Plan of Care (Signed)
Problem: Education: Goal: Knowledge of General Education information will improve Description: Including pain rating scale, medication(s)/side effects and non-pharmacologic comfort measures Outcome: Progressing   Problem: Activity: Goal: Risk for activity intolerance will decrease Outcome: Progressing

## 2023-07-26 NOTE — Progress Notes (Signed)
[facility]                                                                               [address] [name], is a 57 y.o. 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 He underwent left TMA with wound VAC by Dr. Lajoyce Corners 05/05/2023. Therapy recommended home health but patient has no safe disposition. Per sister's report to [facility], "he was staying with some people because he couldn't stay with sister anymore due to violent behaviors. Sister was bringing food and they were letting him stay for free. And now those people are saying he can't come back and sister can't take him either". TOC filed APS report. Now a placement issue. Wound VAC has been removed, suture remove 9/09.  No new complaints. Patient stable for discharge.    Assessment & Plan    Assessment and Plan:   Left foot cellulitis with gangrene and osteomyelitis Vascular surgery consulted suggested no indication for vascular intervention S/p left TMA and wound VAC placement by Dr. Lajoyce Corners. LLE weight bearing as tolerated.  Stable for discharge. Left foot and ankle bandaged. Pt DENIES  any pain.  No new complaints.    History of TBI/cognitive impairment and intermittent aggressive behaviors/depression Psychiatry consulted appreciate recommendations unfortunately patient does not currently have capacity. Continue with Risperdal, Prozac and hydroxyzine. No new complaints.     Type 2 diabetes mellitus with hyperglycemia Continue with sliding scale and semglee 10 units daily.  CBG (last 3)  Recent Labs    07/25/23 2139 07/26/23 0739 07/26/23 1203   GLUCAP 112* 130* 91   No changes in meds.     Unsafe living situation TOC on board for safe disposition APS is also on board   Anemia of chronic disease Monitor H&H intermittently Hemoglobin around 10.6 today.    History of seizures Continue with Keppra 500 mg twice daily   Essential hypertension Blood pressure parameters are optimal. .    Hyponatremia Resolved  GERD On PPI.    Hyperlipidemia On statin.   RN Pressure Injury Documentation:    Malnutrition Type:  Nutrition Problem: Increased nutrient needs Etiology: acute illness (L foot gangrene with osteomyelitis; wound vac)   Malnutrition Characteristics:  Signs/Symptoms: estimated needs   Nutrition Interventions:  Interventions: Refer to RD note for recommendations, Ensure Enlive (each supplement provides 350kcal and 20 grams of protein), Juven, MVI  Estimated body mass index is 22.02 kg/m as calculated from the following:   Height as of 03/11/23: 5\' 2"  (1.575 m).   Weight as of this encounter: 54.6 kg.  Code Status: Full code DVT Prophylaxis:  SCD's Start: 06/02/23 1515 enoxaparin (LOVENOX) injection 40 mg Start: 05/27/23 1000   Level of Care: Level of care: Med-Surg Family Communication: None at bedside  Disposition Plan:     Remains  inpatient appropriate: Safe disposition  Procedures:  S/p left TMA and wound VAC placement by Dr. Lajoyce Corners.   Consultants:   Vascular surgery Orthopedics.   Antimicrobials:   Anti-infectives (From admission, onward)    Start     Dose/Rate Route Frequency Ordered Stop   06/03/23 0600  ceFAZolin (ANCEF) IVPB 2g/100 mL premix        2 g 200 mL/hr over 30 Minutes Intravenous On call to O.R. 06/02/23 1149 06/02/23 1330   06/02/23 2200  ceFAZolin (ANCEF) IVPB 2g/100 mL premix        2 g 200 mL/hr over 30 Minutes Intravenous Every 8 hours 06/02/23 1514 06/03/23 0639   06/02/23 1142  ceFAZolin (ANCEF) 2-4 GM/100ML-% IVPB       Note to Pharmacy: [facility], [name]:  cabinet override      06/02/23 1142 06/02/23 1330   06/01/23 1715  metroNIDAZOLE (FLAGYL) IVPB 500 mg  Status:  Discontinued        500 mg 100 mL/hr over 60 Minutes Intravenous Every 12 hours 06/01/23 1615 06/02/23 1515   05/30/23 0800  ceFAZolin (ANCEF) IVPB 2g/100 mL premix        2 g 200 mL/hr over 30 Minutes Intravenous On call to O.R. 05/30/23 0412 05/30/23 1209   05/28/23 1500  vancomycin (VANCOREADY) IVPB 1250 mg/250 mL  Status:  Discontinued        1,250 mg 166.7 mL/hr over 90 Minutes Intravenous Every 24 hours 05/28/23 1211 06/02/23 1515   05/28/23 1145  vancomycin (VANCOREADY) IVPB 1250 mg/250 mL  Status:  Discontinued        1,250 mg 166.7 mL/hr over 90 Minutes Intravenous Every 24 hours 05/28/23 1051 05/28/23 1211   05/26/23 2030  cefTRIAXone (ROCEPHIN) 2 g in sodium chloride 0.9 % 100 mL IVPB        2 g 200 mL/hr over 30 Minutes Intravenous Every 24 hours 05/26/23 2026 06/01/23 1845        Medications  Scheduled Meds:  vitamin C  1,000 mg Oral Daily   atorvastatin  10 mg Oral Daily   enoxaparin (LOVENOX) injection  40 mg Subcutaneous Daily   feeding supplement (GLUCERNA SHAKE)  237 mL Oral BID BM   FLUoxetine  30 mg Oral Daily   insulin aspart  0-5 Units Subcutaneous QHS   insulin aspart  0-9 Units Subcutaneous TID WC   insulin glargine-yfgn  10 Units Subcutaneous QHS   levETIRAcetam  500 mg Oral BID   multivitamins with iron  1 tablet Oral Daily   nutrition supplement (JUVEN)  1 packet Oral BID BM   pantoprazole  40 mg Oral Daily   risperiDONE  0.25 mg Oral QHS   Continuous Infusions: PRN Meds:.acetaminophen **OR** acetaminophen, alum & mag hydroxide-simeth, bisacodyl, loperamide, ondansetron **OR** ondansetron (ZOFRAN) IV, polyethylene glycol    Subjective:   Romon Corsino was seen and examined today.  No pain or new complaints.   Objective:   Vitals:   07/25/23 1502 07/25/23 2000 07/26/23 0500 07/26/23 0707  BP: 101/65 103/69 104/71 109/67   Pulse: 79 78 81 78  Resp: 18 16 14 16   Temp: 98.3 F (36.8 C) 98.1 F (36.7 C) 98.9 F (37.2 C) 97.7 F (36.5 C)  TempSrc: Oral Oral Oral Oral  SpO2: 96% 97% 98% 96%  Weight:        Intake/Output Summary (Last 24 hours) at 07/26/2023 1321 Last data filed at 07/26/2023 0700 Gross per 24 hour  Intake 240 ml  Output 1400  ml  Net -1160 ml   Filed Weights   05/26/23 2321  Weight: 54.6 kg     Exam  General exam: Appears calm and comfortable  Respiratory system: Clear to auscultation. Respiratory effort normal. Cardiovascular system: S1 & S2 heard, RRR.  Gastrointestinal system: Abdomen is nondistended, soft and nontender.  Central nervous system: Alert and oriented.       Data Reviewed:  I have personally reviewed following labs and imaging studies   CBC Lab Results  Component Value Date   WBC 5.2 07/21/2023   RBC 3.48 (L) 07/21/2023   HGB 10.6 (L) 07/21/2023   HCT 30.9 (L) 07/21/2023   MCV 88.8 07/21/2023   MCH 30.5 07/21/2023   PLT 209 07/21/2023   MCHC 34.3 07/21/2023   RDW 12.9 07/21/2023   LYMPHSABS 1.1 06/16/2023   MONOABS 0.4 06/16/2023   EOSABS 0.1 06/16/2023   BASOSABS 0.0 06/16/2023     Last metabolic panel Lab Results  Component Value Date   NA 135 07/21/2023   K 3.9 07/21/2023   CL 104 07/21/2023   CO2 25 07/21/2023   BUN 35 (H) 07/21/2023   CREATININE 1.07 07/21/2023   GLUCOSE 112 (H) 07/21/2023   GFRNONAA >60 07/21/2023   GFRAA >60 01/02/2020   CALCIUM 8.6 (L) 07/21/2023   PHOS 3.7 06/12/2023   PROT 6.9 05/26/2023   ALBUMIN 2.4 (L) 06/07/2023   LABGLOB 2.5 04/20/2019   AGRATIO 1.6 04/20/2019   BILITOT 0.4 05/26/2023   ALKPHOS 59 05/26/2023   AST 14 (L) 05/26/2023   ALT 17 05/26/2023   ANIONGAP 6 07/21/2023    CBG (last 3)  Recent Labs    07/25/23 2139 07/26/23 0739 07/26/23 1203  GLUCAP 112* 130* 91      Coagulation Profile: No results for input(s): "INR", "PROTIME" in the last 168 hours.   Radiology  Studies: No results found.     Kathlen Mody M.D. [facility] 07/26/2023, 1:21 PM  Available via Epic secure chat 7am-7pm After 7 pm, please refer to night coverage provider listed on amion.

## 2023-07-26 NOTE — Progress Notes (Signed)
Nutrition Follow-up Brief Note  Patient remains medically stable for discharge. Awaiting safe discharge plan.   Patient remains on a carb modified diet; consuming 100% of meals and all nutrition supplements. No further nutrition interventions to implement at this time. Will continue to monitor for any changes in status.   Reviewed medications and labs.   Please re-consult if additional nutrition related concerns arise in the meantime, please re-consult or reach out via pager/secure chat.   Drusilla Kanner, RDN, LDN Clinical Nutrition

## 2023-07-26 NOTE — Plan of Care (Signed)
  Problem: Education: Goal: Knowledge of General Education information will improve Description: Including pain rating scale, medication(s)/side effects and non-pharmacologic comfort measures 07/26/2023 1123 by Gaspar Cola, RN Outcome: Progressing 07/26/2023 0807 by Gaspar Cola, RN Outcome: Progressing   Problem: Activity: Goal: Risk for activity intolerance will decrease 07/26/2023 1123 by Gaspar Cola, RN Outcome: Progressing 07/26/2023 0807 by Gaspar Cola, RN Outcome: Progressing

## 2023-07-26 NOTE — Progress Notes (Signed)
Chaplain went to visit Pt as a follow-up, Pt has been in the hospital for over 60 days. Pt shared he is tired and bored, that he is considering going back to stay with his family in [place]. Pt misses staying around family. Pt has a daughter and two granddaughters in [place]. Pt relies on his sister to take care of him. Pt is appreciative of the support of his faith community. Volunteers from his church are coming regularly, every week. Pt was also grateful to have an opportunity to have spiritual support in his own language and asked Chaplain if it would be possible to have Catholic sacramental support for him anytime soon. Chaplain will contact local Catholic clergy to come and administer sacramental services for Pt. Chaplain would follow up on Pt.  07/26/23 1637  Spiritual Encounters  Type of Visit Follow up  Care provided to: Patient  Referral source Chaplain assessment  Reason for visit Routine spiritual support  OnCall Visit No  Spiritual Framework  Presenting Themes Meaning/purpose/sources of inspiration;Values and beliefs  Community/Connection Family;Faith community  Patient Stress Factors Exhausted;Loss of control  Family Stress Factors None identified  Interventions  Spiritual Care Interventions Made Meaning making;Encouragement  Intervention Outcomes  Outcomes Awareness around self/spiritual resourses;Awareness of support;Reduced isolation  Spiritual Care Plan  Spiritual Care Issues Still Outstanding Chaplain will continue to follow   Oneida Alar Chaplain Intern

## 2023-07-27 LAB — CBC WITH DIFFERENTIAL/PLATELET
Abs Immature Granulocytes: 0.02 10*3/uL (ref 0.00–0.07)
Basophils Absolute: 0 10*3/uL (ref 0.0–0.1)
Basophils Relative: 1 %
Eosinophils Absolute: 0.2 10*3/uL (ref 0.0–0.5)
Eosinophils Relative: 4 %
HCT: 31.8 % — ABNORMAL LOW (ref 39.0–52.0)
Hemoglobin: 10.9 g/dL — ABNORMAL LOW (ref 13.0–17.0)
Immature Granulocytes: 0 %
Lymphocytes Relative: 25 %
Lymphs Abs: 1.3 10*3/uL (ref 0.7–4.0)
MCH: 30.1 pg (ref 26.0–34.0)
MCHC: 34.3 g/dL (ref 30.0–36.0)
MCV: 87.8 fL (ref 80.0–100.0)
Monocytes Absolute: 0.6 10*3/uL (ref 0.1–1.0)
Monocytes Relative: 10 %
Neutro Abs: 3.2 10*3/uL (ref 1.7–7.7)
Neutrophils Relative %: 60 %
Platelets: 196 10*3/uL (ref 150–400)
RBC: 3.62 MIL/uL — ABNORMAL LOW (ref 4.22–5.81)
RDW: 12.6 % (ref 11.5–15.5)
WBC: 5.3 10*3/uL (ref 4.0–10.5)
nRBC: 0 % (ref 0.0–0.2)

## 2023-07-27 LAB — BASIC METABOLIC PANEL
Anion gap: 6 (ref 5–15)
BUN: 30 mg/dL — ABNORMAL HIGH (ref 6–20)
CO2: 26 mmol/L (ref 22–32)
Calcium: 8.9 mg/dL (ref 8.9–10.3)
Chloride: 104 mmol/L (ref 98–111)
Creatinine, Ser: 1.06 mg/dL (ref 0.61–1.24)
GFR, Estimated: >60 mL/min (ref >60)
Glucose, Bld: 105 mg/dL — ABNORMAL HIGH (ref 70–99)
Potassium: 4.2 mmol/L (ref 3.5–5.1)
Sodium: 136 mmol/L (ref 135–145)

## 2023-07-27 LAB — GLUCOSE, CAPILLARY
Glucose-Capillary: 117 mg/dL — ABNORMAL HIGH (ref 70–99)
Glucose-Capillary: 108 mg/dL — ABNORMAL HIGH (ref 70–99)
Glucose-Capillary: 99 mg/dL (ref 70–99)
Glucose-Capillary: 96 mg/dL (ref 70–99)

## 2023-07-27 NOTE — Progress Notes (Signed)
Physical Therapy Treatment Patient Details Name: [name] MRN: [id number] DOB: 06-06-1966 Today's Date: 07/27/2023   History of Present Illness Pt is a 57 year old male admitted for evaluation of being unable to get up and ambulate, and not eating for 2 days, admitted for left foot gangrene with osteomyelitis. 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    PT Comments  Pt continues to progress his higher level balance through repetition of tasks. Poor short term memory of safety activities like make sure his post op shoe is secured prior to ambulation, and remembering his room number without prompting. Pt is able to ambulate 580+ feet daily at a contact guard level. Pt will need supervision for OOB activities for the foreseeable future. PT will continue to see acutely until discharge.    If plan is discharge home, recommend the following: A little help with walking and/or transfers;A little help with bathing/dressing/bathroom;Assistance with cooking/housework;Direct supervision/assist for medications management;Assist for transportation;Help with stairs or ramp for entrance   Can travel by private vehicle     No  Equipment Recommendations   (will continue to discern)       Precautions / Restrictions Precautions Precautions: Fall;Other (comment) Precaution Comments: History of TBI with left side weakness and cognitive impairments Required Braces or Orthoses: Other Brace Other Brace: L post op shoe Restrictions Weight Bearing Restrictions: No LLE Weight Bearing: Weight bearing as tolerated Other Position/Activity Restrictions: Upgraded to WBAT with postop shoe.     Mobility  Bed Mobility Overal bed mobility: Needs Assistance Bed Mobility: Supine to Sit, Sit to Supine     Supine to sit: Supervision     General bed mobility comments: pt reaches over bed picks up his shoes and puts  them on and then comes to EoB.    Transfers Overall transfer level: Needs assistance Equipment used: Rolling walker (2 wheels) Transfers: Sit to/from Stand Sit to Stand: Contact guard assist           General transfer comment: CGA for safety for power up to RW, once in standing parks RW    Ambulation/Gait Ambulation/Gait assistance: Contact guard assist Gait Distance (Feet): 580 Feet Assistive device: None Gait Pattern/deviations: Shuffle, Staggering left, Staggering right, Wide base of support Gait velocity: unable to change velocity on command, appropriate baseline velocity Gait velocity interpretation: <1.8 ft/sec, indicate of risk for recurrent falls   General Gait Details: continue to work on high level balance with ambulation in hallway, changes in gait speed, turning without outside support Pt reaches out for walls for support however is able to turn with out UE support,         Balance Overall balance assessment: Needs assistance Sitting-balance support: No upper extremity supported, Feet supported Sitting balance-Leahy Scale: Normal     Standing balance support: Single extremity supported, During functional activity Standing balance-Leahy Scale: Fair Standing balance comment: looks for support but is able to stabilize with both static and dynamic balance at a contact guard level             High level balance activites: Direction changes, Sudden stops, Turns, Backward walking, Side stepping High Level Balance Comments: pt requires increased time and mental effort to perform changes            Cognition Arousal: Alert Behavior During Therapy: Impulsive Overall Cognitive Status: History of cognitive impairments - at baseline  General Comments: hx of cognitive impairment, impulsive, requires cues for safety and to ask for assistance. needs repeated cues to maintain corrections        Exercises Other  Exercises Other Exercises: bending down to touch ground without assist x2    General Comments General comments (skin integrity, edema, etc.): In house interpreter utilized throughout session      Pertinent Vitals/Pain Pain Assessment Pain Assessment: No/denies pain     PT Goals (current goals can now be found in the care plan section) Acute Rehab PT Goals PT Goal Formulation: With patient Time For Goal Achievement: 08/03/23 Potential to Achieve Goals: Good Progress towards PT goals: Progressing toward goals    Frequency    Min 1X/week      PT Plan Current plan remains appropriate       AM-PAC PT "6 Clicks" Mobility   Outcome Measure  Help needed turning from your back to your side while in a flat bed without using bedrails?: None Help needed moving from lying on your back to sitting on the side of a flat bed without using bedrails?: None Help needed moving to and from a bed to a chair (including a wheelchair)?: A Little Help needed standing up from a chair using your arms (e.g., wheelchair or bedside chair)?: A Little Help needed to walk in hospital room?: A Little Help needed climbing 3-5 steps with a railing? : A Little 6 Click Score: 20    End of Session Equipment Utilized During Treatment: Gait belt Activity Tolerance: Patient tolerated treatment well Patient left: with call bell/phone within reach;in chair;with chair alarm set;with family/visitor present Nurse Communication: Mobility status PT Visit Diagnosis: Other abnormalities of gait and mobility (R26.89)     Time: 2440-1027 PT Time Calculation (min) (ACUTE ONLY): 18 min  Charges:      PT General Charges $$ ACUTE PT VISIT: 1 Visit                     Josefita Weissmann B. Beverely Risen PT, DPT Acute Rehabilitation Services Please use secure chat or  Call Office [phone]    Elon Alas [facility] 07/27/2023, 4:13 PM

## 2023-07-27 NOTE — Plan of Care (Signed)
Problem: Education: Goal: Knowledge of General Education information will improve Description: Including pain rating scale, medication(s)/side effects and non-pharmacologic comfort measures Outcome: Progressing   Problem: Coping: Goal: Level of anxiety will decrease Outcome: Progressing

## 2023-07-27 NOTE — Plan of Care (Signed)
Problem: Education: Goal: Ability to describe self-care measures that may prevent or decrease complications (Diabetes Survival Skills Education) will improve Outcome: Not Progressing Goal: Individualized Educational Video(s) Outcome: Not Progressing   Problem: Coping: Goal: Ability to adjust to condition or change in health will improve Outcome: Not Progressing   Problem: Health Behavior/Discharge Planning: Goal: Ability to identify and utilize available resources and services will improve Outcome: Not Progressing Goal: Ability to manage health-related needs will improve Outcome: Not Progressing   Problem: Metabolic: Goal: Ability to maintain appropriate glucose levels will improve Outcome: Not Progressing   Problem: Nutritional: Goal: Progress toward achieving an optimal weight will improve Outcome: Not Progressing   Problem: Skin Integrity: Goal: Risk for impaired skin integrity will decrease Outcome: Not Progressing   Problem: Tissue Perfusion: Goal: Adequacy of tissue perfusion will improve Outcome: Not Progressing   Problem: Education: Goal: Knowledge of General Education information will improve Description: Including pain rating scale, medication(s)/side effects and non-pharmacologic comfort measures Outcome: Not Progressing   Problem: Health Behavior/Discharge Planning: Goal: Ability to manage health-related needs will improve Outcome: Not Progressing   Problem: Clinical Measurements: Goal: Ability to maintain clinical measurements within normal limits will improve Outcome: Not Progressing Goal: Will remain free from infection Outcome: Not Progressing Goal: Diagnostic test results will improve Outcome: Not Progressing Goal: Respiratory complications will improve Outcome: Not Progressing Goal: Cardiovascular complication will be avoided Outcome: Not Progressing   Problem: Activity: Goal: Risk for activity intolerance will decrease Outcome: Not  Progressing   Problem: Nutrition: Goal: Adequate nutrition will be maintained Outcome: Not Progressing   Problem: Coping: Goal: Level of anxiety will decrease Outcome: Not Progressing   Problem: Elimination: Goal: Will not experience complications related to bowel motility Outcome: Not Progressing Goal: Will not experience complications related to urinary retention Outcome: Not Progressing   Problem: Pain Managment: Goal: General experience of comfort will improve Outcome: Not Progressing   Problem: Safety: Goal: Ability to remain free from injury will improve Outcome: Not Progressing   Problem: Skin Integrity: Goal: Risk for impaired skin integrity will decrease Outcome: Not Progressing   Problem: Education: Goal: Knowledge of General Education information will improve Description: Including pain rating scale, medication(s)/side effects and non-pharmacologic comfort measures Outcome: Not Progressing   Problem: Health Behavior/Discharge Planning: Goal: Ability to manage health-related needs will improve Outcome: Not Progressing   Problem: Clinical Measurements: Goal: Ability to maintain clinical measurements within normal limits will improve Outcome: Not Progressing Goal: Will remain free from infection Outcome: Not Progressing Goal: Diagnostic test results will improve Outcome: Not Progressing Goal: Respiratory complications will improve Outcome: Not Progressing Goal: Cardiovascular complication will be avoided Outcome: Not Progressing   Problem: Activity: Goal: Risk for activity intolerance will decrease Outcome: Not Progressing   Problem: Nutrition: Goal: Adequate nutrition will be maintained Outcome: Not Progressing   Problem: Coping: Goal: Level of anxiety will decrease Outcome: Not Progressing   Problem: Elimination: Goal: Will not experience complications related to bowel motility Outcome: Not Progressing Goal: Will not experience complications  related to urinary retention Outcome: Not Progressing   Problem: Pain Managment: Goal: General experience of comfort will improve Outcome: Not Progressing   Problem: Safety: Goal: Ability to remain free from injury will improve Outcome: Not Progressing   Problem: Skin Integrity: Goal: Risk for impaired skin integrity will decrease Outcome: Not Progressing   Problem: Education: Goal: Knowledge of the prescribed therapeutic regimen will improve Outcome: Not Progressing Goal: Ability to verbalize activity precautions or restrictions will improve Outcome: Not Progressing Goal:  Understanding of discharge needs will improve Outcome: Not Progressing   Problem: Activity: Goal: Ability to perform//tolerate increased activity and mobilize with assistive devices will improve Outcome: Not Progressing

## 2023-07-27 NOTE — Progress Notes (Signed)
Occupational Therapy Treatment/Discharge Patient Details Name: [name] MRN: [id number] DOB: Apr 01, 1966 Today's Date: 07/27/2023   History of present illness Pt is a 57 year old male admitted for evaluation of being unable to get up and ambulate, and not eating for 2 days, admitted for left foot gangrene with osteomyelitis. 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   OT comments  Pt actively participated in therapy, interpreter used Alesia Banda [phone]. Pt able to ambualte around room as needed supervision with/without RW for support, able to complete dressing independently, able to change bed sheets with supervision for verbal cueing sequencing/problem solving. Pt able to transport objects as needed around room with no LOB independently. Pt able to use call light and express needs, able to use restroom. Pt has reached max potential in acute setting, no further acute OT needs.      If plan is discharge home, recommend the following:  A little help with walking and/or transfers;A little help with bathing/dressing/bathroom;Assistance with cooking/housework;Assist for transportation   Equipment Recommendations  Other (comment)    Recommendations for Other Services      Precautions / Restrictions Precautions Precautions: Fall;Other (comment) Precaution Comments: History of TBI with left side weakness and cognitive impairments Required Braces or Orthoses: Other Brace Other Brace: L post op shoe Restrictions Weight Bearing Restrictions: No LLE Weight Bearing: Weight bearing as tolerated Other Position/Activity Restrictions: Upgraded to WBAT with postop shoe.       Mobility Bed Mobility Overal bed mobility: Modified Independent             General bed mobility comments: able to get in/out of bed as needed    Transfers Overall transfer level: Needs assistance Equipment used: Rolling  walker (2 wheels) Transfers: Sit to/from Stand, Bed to chair/wheelchair/BSC Sit to Stand: Supervision     Step pivot transfers: Supervision     General transfer comment: supervision for mobility around room as needed, with/without RW     Balance Overall balance assessment: Needs assistance Sitting-balance support: No upper extremity supported, Feet supported Sitting balance-Leahy Scale: Normal Sitting balance - Comments: sitting on EOB   Standing balance support: Single extremity supported, During functional activity Standing balance-Leahy Scale: Good Standing balance comment: able to perform mobility/standing supervision, no LOB                           ADL either performed or assessed with clinical judgement   ADL Overall ADL's : Needs assistance/impaired                                       General ADL Comments: set up/supervision for all ADLs, safety/sequencing, daily routines    Extremity/Trunk Assessment Upper Extremity Assessment Upper Extremity Assessment: Overall WFL for tasks assessed            Vision       Perception     Praxis      Cognition Arousal: Alert Behavior During Therapy: WFL for tasks assessed/performed Overall Cognitive Status: History of cognitive impairments - at baseline                                 General Comments: increased cueing for sequencing and problem solving, and for daily routine  Exercises      Shoulder Instructions       General Comments In house interpreter utilized throughout session    Pertinent Vitals/ Pain       Pain Assessment Pain Assessment: No/denies pain  Home Living                                          Prior Functioning/Environment              Frequency  Min 1X/week        Progress Toward Goals  OT Goals(current goals can now be found in the care plan section)  Progress towards OT goals: Goals met/education  completed, patient discharged from OT  Acute Rehab OT Goals Patient Stated Goal: to return home with family OT Goal Formulation: With patient Time For Goal Achievement: 07/27/23 Potential to Achieve Goals: Good ADL Goals Pt Will Perform Lower Body Dressing: with modified independence Pt Will Transfer to Toilet: with modified independence Pt Will Perform Toileting - Clothing Manipulation and hygiene: with modified independence Additional ADL Goal #1: Pt will independently call/request assistance when necessary for toileting to increase bladder/bowl continence and increase ability to manage personal/toilet hygiene.  Plan      Co-evaluation                 AM-PAC OT "6 Clicks" Daily Activity     Outcome Measure   Help from another person eating meals?: A Little Help from another person taking care of personal grooming?: A Little Help from another person toileting, which includes using toliet, bedpan, or urinal?: A Little Help from another person bathing (including washing, rinsing, drying)?: A Little Help from another person to put on and taking off regular upper body clothing?: A Little Help from another person to put on and taking off regular lower body clothing?: A Little 6 Click Score: 18    End of Session Equipment Utilized During Treatment: Rolling walker (2 wheels)  OT Visit Diagnosis: Unsteadiness on feet (R26.81);Other abnormalities of gait and mobility (R26.89);Muscle weakness (generalized) (M62.81);Other symptoms and signs involving cognitive function;Pain Pain - Right/Left: Left Pain - part of body: Ankle and joints of foot   Activity Tolerance Patient tolerated treatment well   Patient Left in bed;with call bell/phone within reach   Nurse Communication Mobility status        Time: 1478-2956 OT Time Calculation (min): 30 min  Charges: OT General Charges $OT Visit: 1 Visit OT Treatments $Self Care/Home Management : 23-37 mins  [place],  OTR/L   [name] 07/27/2023, 5:37 PM

## 2023-07-27 NOTE — Progress Notes (Signed)
[facility]                                                                               [address] [name], is a 57 y.o. 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 He underwent left TMA with wound VAC by Dr. Lajoyce Corners 05/05/2023. Therapy recommended home health but patient has no safe disposition. Per sister's report to [facility], "he was staying with some people because he couldn't stay with sister anymore due to violent behaviors. Sister was bringing food and they were letting him stay for free. And now those people are saying he can't come back and sister can't take him either". TOC filed APS report. Now a placement issue. Wound VAC has been removed, suture remove 9/09.  Patient stable for discharge.  No events from 9/18 to 9/24.    Assessment & Plan    Assessment and Plan:   Left foot cellulitis with gangrene and osteomyelitis Vascular surgery consulted suggested no indication for vascular intervention S/p left TMA and wound VAC placement by Dr. Lajoyce Corners. LLE weight bearing as tolerated.  Stable for discharge. Left foot and ankle bandaged. Pt DENIES  any pain.  No new complaints.    History of TBI/cognitive impairment and intermittent aggressive behaviors/depression Psychiatry consulted appreciate recommendations unfortunately patient does not currently have capacity. Continue with Risperdal, Prozac and hydroxyzine. No new complaints.     Type 2 diabetes mellitus with hyperglycemia Continue with sliding scale and semglee 10 units daily.  CBG (last 3)  Recent Labs    07/26/23 1537 07/26/23 2044  07/27/23 0708  GLUCAP 138* 128* 117*   No changes in meds.     Unsafe living situation TOC on board for safe disposition APS is also on board   Anemia of chronic disease Monitor H&H intermittently Hemoglobin around 10.9 today.    History of seizures Continue with Keppra 500 mg twice daily   Essential hypertension Blood pressure parameters are well controlled.   Hyponatremia Resolved  GERD On PPI.    Hyperlipidemia On statin.   RN Pressure Injury Documentation:    Malnutrition Type:  Nutrition Problem: Increased nutrient needs Etiology: acute illness (L foot gangrene with osteomyelitis; wound vac)   Malnutrition Characteristics:  Signs/Symptoms: estimated needs   Nutrition Interventions:  Interventions: Refer to RD note for recommendations, Ensure Enlive (each supplement provides 350kcal and 20 grams of protein), Juven, MVI  Estimated body mass index is 22.02 kg/m as calculated from the following:   Height as of 03/11/23: 5\' 2"  (1.575 m).   Weight as of this encounter: 54.6 kg.  Code Status: Full code DVT Prophylaxis:  SCD's Start: 06/02/23 1515 enoxaparin (LOVENOX) injection 40 mg Start: 05/27/23 1000   Level of Care: Level of care: Med-Surg Family Communication: None at bedside  Disposition Plan:  Remains inpatient appropriate: Safe disposition  Procedures:  S/p left TMA and wound VAC placement by Dr. Lajoyce Corners.   Consultants:   Vascular surgery Orthopedics.   Antimicrobials:   Anti-infectives (From admission, onward)    Start     Dose/Rate Route Frequency Ordered Stop   06/03/23 0600  ceFAZolin (ANCEF) IVPB 2g/100 mL premix        2 g 200 mL/hr over 30 Minutes Intravenous On call to O.R. 06/02/23 1149 06/02/23 1330   06/02/23 2200  ceFAZolin (ANCEF) IVPB 2g/100 mL premix        2 g 200 mL/hr over 30 Minutes Intravenous Every 8 hours 06/02/23 1514 06/03/23 0639   06/02/23 1142  ceFAZolin (ANCEF) 2-4 GM/100ML-% IVPB       Note to  Pharmacy: [facility], [name]: cabinet override      06/02/23 1142 06/02/23 1330   06/01/23 1715  metroNIDAZOLE (FLAGYL) IVPB 500 mg  Status:  Discontinued        500 mg 100 mL/hr over 60 Minutes Intravenous Every 12 hours 06/01/23 1615 06/02/23 1515   05/30/23 0800  ceFAZolin (ANCEF) IVPB 2g/100 mL premix        2 g 200 mL/hr over 30 Minutes Intravenous On call to O.R. 05/30/23 0412 05/30/23 1209   05/28/23 1500  vancomycin (VANCOREADY) IVPB 1250 mg/250 mL  Status:  Discontinued        1,250 mg 166.7 mL/hr over 90 Minutes Intravenous Every 24 hours 05/28/23 1211 06/02/23 1515   05/28/23 1145  vancomycin (VANCOREADY) IVPB 1250 mg/250 mL  Status:  Discontinued        1,250 mg 166.7 mL/hr over 90 Minutes Intravenous Every 24 hours 05/28/23 1051 05/28/23 1211   05/26/23 2030  cefTRIAXone (ROCEPHIN) 2 g in sodium chloride 0.9 % 100 mL IVPB        2 g 200 mL/hr over 30 Minutes Intravenous Every 24 hours 05/26/23 2026 06/01/23 1845        Medications  Scheduled Meds:  vitamin C  1,000 mg Oral Daily   atorvastatin  10 mg Oral Daily   enoxaparin (LOVENOX) injection  40 mg Subcutaneous Daily   feeding supplement (GLUCERNA SHAKE)  237 mL Oral BID BM   FLUoxetine  30 mg Oral Daily   insulin aspart  0-5 Units Subcutaneous QHS   insulin aspart  0-9 Units Subcutaneous TID WC   insulin glargine-yfgn  10 Units Subcutaneous QHS   levETIRAcetam  500 mg Oral BID   multivitamins with iron  1 tablet Oral Daily   nutrition supplement (JUVEN)  1 packet Oral BID BM   pantoprazole  40 mg Oral Daily   risperiDONE  0.25 mg Oral QHS   Continuous Infusions: PRN Meds:.acetaminophen **OR** acetaminophen, alum & mag hydroxide-simeth, bisacodyl, loperamide, ondansetron **OR** ondansetron (ZOFRAN) IV, polyethylene glycol    Subjective:   Jrake Bankes was seen and examined today.  No pain, no new complaints.   Objective:   Vitals:   07/26/23 0707 07/26/23 1453 07/27/23 0008 07/27/23 0709  BP:  109/67 112/68 133/83 111/71  Pulse: 78 74 85 72  Resp: 16 17 19 18   Temp: 97.7 F (36.5 C) 98 F (36.7 C) 98.4 F (36.9 C) 98.6 F (37 C)  TempSrc: Oral Oral Oral Oral  SpO2: 96% 98% 98% 98%  Weight:        Intake/Output Summary (Last 24 hours) at 07/27/2023 1111 Last data filed at 07/27/2023 0700 Gross per 24 hour  Intake --  Output 900  ml  Net -900 ml   Filed Weights   05/26/23 2321  Weight: 54.6 kg     Exam  General exam: Appears calm and comfortable  Respiratory system: Clear to auscultation. Respiratory effort normal. Cardiovascular system: S1 & S2 heard, RRR. No JVD, Gastrointestinal system: Abdomen is nondistended, soft and nontender.  Central nervous system: Alert and oriented to person and place.  Extremities: left foot bandaged.  Skin: No rashes,        Data Reviewed:  I have personally reviewed following labs and imaging studies   CBC Lab Results  Component Value Date   WBC 5.3 07/27/2023   RBC 3.62 (L) 07/27/2023   HGB 10.9 (L) 07/27/2023   HCT 31.8 (L) 07/27/2023   MCV 87.8 07/27/2023   MCH 30.1 07/27/2023   PLT 196 07/27/2023   MCHC 34.3 07/27/2023   RDW 12.6 07/27/2023   LYMPHSABS 1.3 07/27/2023   MONOABS 0.6 07/27/2023   EOSABS 0.2 07/27/2023   BASOSABS 0.0 07/27/2023     Last metabolic panel Lab Results  Component Value Date   NA 136 07/27/2023   K 4.2 07/27/2023   CL 104 07/27/2023   CO2 26 07/27/2023   BUN 30 (H) 07/27/2023   CREATININE 1.06 07/27/2023   GLUCOSE 105 (H) 07/27/2023   GFRNONAA >60 07/27/2023   GFRAA >60 01/02/2020   CALCIUM 8.9 07/27/2023   PHOS 3.7 06/12/2023   PROT 6.9 05/26/2023   ALBUMIN 2.4 (L) 06/07/2023   LABGLOB 2.5 04/20/2019   AGRATIO 1.6 04/20/2019   BILITOT 0.4 05/26/2023   ALKPHOS 59 05/26/2023   AST 14 (L) 05/26/2023   ALT 17 05/26/2023   ANIONGAP 6 07/27/2023    CBG (last 3)  Recent Labs    07/26/23 1537 07/26/23 2044 07/27/23 0708  GLUCAP 138* 128* 117*      Coagulation  Profile: No results for input(s): "INR", "PROTIME" in the last 168 hours.   Radiology Studies: No results found.     Kathlen Mody M.D. [facility] 07/27/2023, 11:11 AM  Available via Epic secure chat 7am-7pm After 7 pm, please refer to night coverage provider listed on amion.

## 2023-07-28 LAB — GLUCOSE, CAPILLARY
Glucose-Capillary: 177 mg/dL — ABNORMAL HIGH (ref 70–99)
Glucose-Capillary: 101 mg/dL — ABNORMAL HIGH (ref 70–99)
Glucose-Capillary: 187 mg/dL — ABNORMAL HIGH (ref 70–99)
Glucose-Capillary: 105 mg/dL — ABNORMAL HIGH (ref 70–99)

## 2023-07-28 NOTE — Plan of Care (Signed)
Problem: Education: Goal: Ability to describe self-care measures that may prevent or decrease complications (Diabetes Survival Skills Education) will improve Outcome: Progressing   Problem: Pain Managment: Goal: General experience of comfort will improve Outcome: Progressing   Problem: Safety: Goal: Ability to remain free from injury will improve Outcome: Progressing   Problem: Skin Integrity: Goal: Risk for impaired skin integrity will decrease Outcome: Progressing

## 2023-07-28 NOTE — Progress Notes (Signed)
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 He underwent left TMA with wound VAC by Dr. Lajoyce Corners on 05/05/2023. Therapy recommended home health but patient has no safe disposition. Per sister's report to [facility], "he was staying with some people because he couldn't stay with sister anymore due to violent behaviors. Sister was bringing food and they were letting him stay for free. And now those people are saying he can't come back and sister can't take him either". TOC filed APS report. Now a placement issue. Wound VAC has been removed, suture removed 9/09.  Patient stable for discharge.  No events from 9/18 to 9/24.   Assessment & Plan:   Principal Problem:   Cellulitis of left foot Active Problems:   Essential hypertension   Type 2 diabetes mellitus with peripheral neuropathy (HCC)   Seizure disorder (HCC)   MDD (major depressive disorder), recurrent episode, moderate (HCC)   History of traumatic brain injury   Cutaneous abscess of left foot   PAD (peripheral artery disease) (HCC)   Severe protein-calorie malnutrition (HCC)  Left foot cellulitis with gangrene and osteomyelitis: Vascular surgery consulted, suggested no indication for vascular intervention. S/p left TMA and wound VAC placement by Dr. Lajoyce Corners. LLE weight bearing as tolerated.  Stable for discharge. Left foot and ankle bandaged. Pt denies any pain.  No new complaints.      History of TBI/cognitive impairment and intermittent aggressive behaviors/depression Psychiatry consulted appreciate recommendations,  unfortunately patient does not currently have capacity. Continue with Risperdal,  Prozac and hydroxyzine. No new complaints.     Type 2 diabetes mellitus with hyperglycemia Continue with sliding scale and semglee 10 units daily.  No changes in meds.    Unsafe living situation: TOC on board for safe disposition. APS is also on board   Anemia of chronic disease: Monitor H&H intermittently Hemoglobin around 10.9      History of seizures: Continue with Keppra 500 mg twice daily.   Essential hypertension: Blood pressure parameters are well controlled.    Hyponatremia: Resolved.   GERD:  On PPI.     Hyperlipidemia On statin.    RN Pressure Injury Documentation:   Malnutrition Type: Nutrition Problem: Increased nutrient needs Etiology: acute illness (L foot gangrene with osteomyelitis; wound vac)   Malnutrition Characteristics: Signs/Symptoms: estimated needs   Nutrition Interventions: Interventions: Refer to RD note for recommendations, Ensure Enlive (each supplement provides 350kcal and 20 grams of protein), Juven, MVI   Estimated body mass index is 22.02 kg/m as calculated from the following:   Height as of 03/11/23: 5\' 2"  (1.575 m).   Weight as of this encounter: 54.6 kg.   DVT prophylaxis: Lovenox Code Status: Full code Family Communication: No family at bed side. Disposition Plan:     Status is: Inpatient Remains inpatient appropriate because:  Pending safe disposition.   Consultants:  Vascular surgery Orthopedics  Procedures: Status post left TMA and wound VAC placement by Dr. Lajoyce Corners.  Antimicrobials:  Anti-infectives (From admission, onward)    Start     Dose/Rate Route Frequency Ordered Stop   06/03/23 0600  ceFAZolin (ANCEF) IVPB 2g/100 mL premix        2 g 200 mL/hr over  30 Minutes Intravenous On call to O.R. 06/02/23 1149 06/02/23 1330   06/02/23 2200  ceFAZolin (ANCEF) IVPB 2g/100 mL premix        2 g 200 mL/hr over 30 Minutes Intravenous Every 8 hours 06/02/23 1514 06/03/23 0639   06/02/23 1142  ceFAZolin (ANCEF) 2-4  GM/100ML-% IVPB       Note to Pharmacy: [facility], [name]: cabinet override      06/02/23 1142 06/02/23 1330   06/01/23 1715  metroNIDAZOLE (FLAGYL) IVPB 500 mg  Status:  Discontinued        500 mg 100 mL/hr over 60 Minutes Intravenous Every 12 hours 06/01/23 1615 06/02/23 1515   05/30/23 0800  ceFAZolin (ANCEF) IVPB 2g/100 mL premix        2 g 200 mL/hr over 30 Minutes Intravenous On call to O.R. 05/30/23 0412 05/30/23 1209   05/28/23 1500  vancomycin (VANCOREADY) IVPB 1250 mg/250 mL  Status:  Discontinued        1,250 mg 166.7 mL/hr over 90 Minutes Intravenous Every 24 hours 05/28/23 1211 06/02/23 1515   05/28/23 1145  vancomycin (VANCOREADY) IVPB 1250 mg/250 mL  Status:  Discontinued        1,250 mg 166.7 mL/hr over 90 Minutes Intravenous Every 24 hours 05/28/23 1051 05/28/23 1211   05/26/23 2030  cefTRIAXone (ROCEPHIN) 2 g in sodium chloride 0.9 % 100 mL IVPB        2 g 200 mL/hr over 30 Minutes Intravenous Every 24 hours 05/26/23 2026 06/01/23 1845      Subjective: Patient was seen and examined at bedside.  Overnight events noted.   Patient was lying comfortably in bed, denies any concerns.  Objective: Vitals:   07/27/23 0709 07/27/23 1936 07/28/23 0604 07/28/23 0804  BP: 111/71 118/66 120/68 99/61  Pulse: 72 70 66 69  Resp: 18 17 16 16   Temp: 98.6 F (37 C) 99.3 F (37.4 C) 98.4 F (36.9 C) 98.5 F (36.9 C)  TempSrc: Oral   Oral  SpO2: 98% 98% 98% 97%  Weight:        Intake/Output Summary (Last 24 hours) at 07/28/2023 1352 Last data filed at 07/27/2023 2237 Gross per 24 hour  Intake 360 ml  Output 650 ml  Net -290 ml   Filed Weights   05/26/23 2321  Weight: 54.6 kg    Examination:  General exam: Appears calm and comfortable, not in any distress. Respiratory system: Clear to auscultation. Respiratory effort normal.  RR 15 Cardiovascular system: S1 & S2 heard, RRR. No pedal edema. Gastrointestinal system: Abdomen is nondistended, soft and nontender. . Normal  bowel sounds heard. Central nervous system: Alert and oriented x3. No focal neurological deficits. Extremities: Left TMA amputation Skin: No rashes, lesions or ulcers Psychiatry: Judgement and insight appear normal. Mood & affect appropriate.     Data Reviewed: I have personally reviewed following labs and imaging studies  CBC: Recent Labs  Lab 07/27/23 0425  WBC 5.3  NEUTROABS 3.2  HGB 10.9*  HCT 31.8*  MCV 87.8  PLT 196   Basic Metabolic Panel: Recent Labs  Lab 07/27/23 0425  NA 136  K 4.2  CL 104  CO2 26  GLUCOSE 105*  BUN 30*  CREATININE 1.06  CALCIUM 8.9   GFR: Estimated Creatinine Clearance: 59.4 mL/min (by C-G formula based on SCr of 1.06 mg/dL). Liver Function Tests: No results for input(s): "AST", "ALT", "ALKPHOS", "BILITOT", "PROT", "ALBUMIN" in the last 168 hours. No results for input(s): "LIPASE", "AMYLASE" in the  last 168 hours. No results for input(s): "AMMONIA" in the last 168 hours. Coagulation Profile: No results for input(s): "INR", "PROTIME" in the last 168 hours. Cardiac Enzymes: No results for input(s): "CKTOTAL", "CKMB", "CKMBINDEX", "TROPONINI" in the last 168 hours. BNP (last 3 results) No results for input(s): "PROBNP" in the last 8760 hours. HbA1C: No results for input(s): "HGBA1C" in the last 72 hours. CBG: Recent Labs  Lab 07/27/23 1112 07/27/23 1609 07/27/23 2156 07/28/23 0802 07/28/23 1129  GLUCAP 108* 99 96 177* 101*   Lipid Profile: No results for input(s): "CHOL", "HDL", "LDLCALC", "TRIG", "CHOLHDL", "LDLDIRECT" in the last 72 hours. Thyroid Function Tests: No results for input(s): "TSH", "T4TOTAL", "FREET4", "T3FREE", "THYROIDAB" in the last 72 hours. Anemia Panel: No results for input(s): "VITAMINB12", "FOLATE", "FERRITIN", "TIBC", "IRON", "RETICCTPCT" in the last 72 hours. Sepsis Labs: No results for input(s): "PROCALCITON", "LATICACIDVEN" in the last 168 hours.  No results found for this or any previous visit  (from the past 240 hour(s)).   Radiology Studies: No results found.  Scheduled Meds:  vitamin C  1,000 mg Oral Daily   atorvastatin  10 mg Oral Daily   enoxaparin (LOVENOX) injection  40 mg Subcutaneous Daily   feeding supplement (GLUCERNA SHAKE)  237 mL Oral BID BM   FLUoxetine  30 mg Oral Daily   insulin aspart  0-5 Units Subcutaneous QHS   insulin aspart  0-9 Units Subcutaneous TID WC   insulin glargine-yfgn  10 Units Subcutaneous QHS   levETIRAcetam  500 mg Oral BID   multivitamins with iron  1 tablet Oral Daily   nutrition supplement (JUVEN)  1 packet Oral BID BM   pantoprazole  40 mg Oral Daily   risperiDONE  0.25 mg Oral QHS   Continuous Infusions:   LOS: 63 days    Time spent: 50 mins    Willeen Niece, MD [facility]   If 7PM-7AM, please contact night-coverage

## 2023-07-28 NOTE — Progress Notes (Signed)
Mobility Specialist: Progress Note   07/28/23 1604  Mobility  Activity Ambulated with assistance in hallway  Level of Assistance Contact guard assist, steadying assist  Assistive Device None  Distance Ambulated (ft) 550 ft  LLE Weight Bearing WBAT (with post op boot)  Activity Response Tolerated well  Mobility Referral Yes  $Mobility charge 1 Mobility  Mobility Specialist Start Time (ACUTE ONLY) 1334  Mobility Specialist Stop Time (ACUTE ONLY) 1342  Mobility Specialist Time Calculation (min) (ACUTE ONLY) 8 min    Pt was agreeable to mobility session - received in bed. Ambulated without UE support well; still taking small steps but steadiness has improved. Returned to room without fault. Left in chair with all needs met, call bell in reach.   [name] Mobility Specialist Please contact via SecureChat or Rehab office at [phone]

## 2023-07-29 LAB — GLUCOSE, CAPILLARY
Glucose-Capillary: 132 mg/dL — ABNORMAL HIGH (ref 70–99)
Glucose-Capillary: 147 mg/dL — ABNORMAL HIGH (ref 70–99)
Glucose-Capillary: 132 mg/dL — ABNORMAL HIGH (ref 70–99)
Glucose-Capillary: 109 mg/dL — ABNORMAL HIGH (ref 70–99)

## 2023-07-29 NOTE — TOC Progression Note (Signed)
Transition of Care [facility]) - Progression Note    Patient Details  Name: [name] MRN: [id number] Date of Birth: 11-02-1966  [facility]) CM/SW Contact  Lorri Frederick, LCSW Phone Number: 07/29/2023, 8:42 AM  Clinical Narrative:    Email from Anders Grant on 9/23:  Good afternoone:   Did [facility] decided to keep Jamie. [name] as a patient rather than discharging him to a rehab?  Is Jamie. [name] still at [facility] with 30 days up on October 9?  Please respond as soon as possible so that I can inform both [facility] & our agency attorney.  Thanks so much, Earl Lites.   Response sent:   [name] never was offered a bed by any skilled nursing facility.  He has since progressed with his physical therapy so that he no longer requires that level of care.  He is now only recommended for outpatient physical therapy follow up.  His only barrier for discharge is that he has nowhere to go.  We are waiting on DSS to locate placement so that he can discharge back to the community. Daleen Squibb, LCSW [place]   Expected Discharge Plan: Home w Home Health Services Barriers to Discharge: No Barriers Identified  Expected Discharge Plan and Services       Living arrangements for the past 2 months: Single Family Home                                       Social Determinants of Health (SDOH) Interventions SDOH Screenings   Depression (PHQ2-9): Low Risk  (03/11/2023)  Tobacco Use: Low Risk  (06/02/2023)    Readmission Risk Interventions     No data to display

## 2023-07-29 NOTE — Plan of Care (Signed)
  Problem: Coping: Goal: Ability to adjust to condition or change in health will improve Outcome: Progressing   

## 2023-07-29 NOTE — Plan of Care (Signed)
  Problem: Education: Goal: Ability to describe self-care measures that may prevent or decrease complications (Diabetes Survival Skills Education) will improve Outcome: Progressing   Problem: Coping: Goal: Ability to adjust to condition or change in health will improve Outcome: Progressing   Problem: Metabolic: Goal: Ability to maintain appropriate glucose levels will improve Outcome: Progressing   Problem: Pain Managment: Goal: General experience of comfort will improve Outcome: Progressing

## 2023-07-29 NOTE — Progress Notes (Signed)
Physical Therapy Treatment Patient Details Name: [name] MRN: [id number] DOB: [birth year] Today's Date: 07/29/2023   History of Present Illness Pt is a 57 year old male admitted for evaluation of being unable to get up and ambulate, and not eating for 2 days, admitted for left foot gangrene with osteomyelitis. 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    PT Comments  Utilized in house interpreter Raquel throughout session. Focus of session on scavenger hunt to find 8 blocks 2 each of 4 different colors. Task required pt to carry basin to hold blocks, reaching down to objects on floor, counting, determining missing block color and number. Pt with good completion, however once all blocks were found pt unable to find his way back to his room despite attempts to use room numbers and counting. Eventually, able to find room from pink piece of paper on door that has interpreter services information on it. Overall, pt is able to complete ambulation, picking up items from the floor as well as carrying basin at a contact guard level. Pt will need at least supervision level assist in any discharge location at this time.       If plan is discharge home, recommend the following: A little help with walking and/or transfers;A little help with bathing/dressing/bathroom;Assistance with cooking/housework;Direct supervision/assist for medications management;Assist for transportation;Help with stairs or ramp for entrance   Can travel by private vehicle     No  Equipment Recommendations   (will continue to discern)       Precautions / Restrictions Precautions Precautions: Fall;Other (comment) Precaution Comments: History of TBI with left side weakness and cognitive impairments Required Braces or Orthoses: Other Brace Other Brace: L post op shoe Restrictions Weight Bearing Restrictions: No LLE Weight Bearing:  Weight bearing as tolerated Other Position/Activity Restrictions: Upgraded to WBAT with postop shoe.     Mobility  Bed Mobility Overal bed mobility: Needs Assistance Bed Mobility: Supine to Sit, Sit to Supine     Supine to sit: Supervision     General bed mobility comments: able to come to EoB with minor use of bed rails    Transfers Overall transfer level: Needs assistance Equipment used: Rolling walker (2 wheels) Transfers: Sit to/from Stand Sit to Stand: Contact guard assist           General transfer comment: CGA for safety for power up    Ambulation/Gait Ambulation/Gait assistance: Contact guard assist Gait Distance (Feet): 580 Feet Assistive device: None Gait Pattern/deviations: Shuffle, Staggering left, Staggering right, Wide base of support Gait velocity: unable to change velocity on command, appropriate baseline velocity Gait velocity interpretation: <1.8 ft/sec, indicate of risk for recurrent falls   General Gait Details: able to carry basin around unit, continues to have festinating gait         Balance Overall balance assessment: Needs assistance Sitting-balance support: No upper extremity supported, Feet supported Sitting balance-Leahy Scale: Normal     Standing balance support: Single extremity supported, During functional activity Standing balance-Leahy Scale: Fair Standing balance comment: looks for support but is able to stabilize with both static and dynamic balance at a contact guard level               High Level Balance Comments: able to pick up items from floor with support of wall            Cognition Arousal: Alert Behavior During Therapy: Impulsive Overall Cognitive Status: History of  cognitive impairments - at baseline                                 General Comments: hx of cognitive impairment, impulsive, requires cues for safety and to ask for assistance. needs repeated cues to maintain corrections         Exercises Other Exercises Other Exercises: bending down to pick up things from the floor    General Comments General comments (skin integrity, edema, etc.): In house interpreter Raquel utilized throughout session      Pertinent Vitals/Pain Pain Assessment Pain Assessment: No/denies pain     PT Goals (current goals can now be found in the care plan section) Acute Rehab PT Goals PT Goal Formulation: With patient Time For Goal Achievement: 08/03/23 Potential to Achieve Goals: Good Progress towards PT goals: Progressing toward goals    Frequency    Min 1X/week       AM-PAC PT "6 Clicks" Mobility   Outcome Measure  Help needed turning from your back to your side while in a flat bed without using bedrails?: None Help needed moving from lying on your back to sitting on the side of a flat bed without using bedrails?: None Help needed moving to and from a bed to a chair (including a wheelchair)?: A Little Help needed standing up from a chair using your arms (e.g., wheelchair or bedside chair)?: A Little Help needed to walk in hospital room?: A Little Help needed climbing 3-5 steps with a railing? : A Little 6 Click Score: 20    End of Session Equipment Utilized During Treatment: Gait belt Activity Tolerance: Patient tolerated treatment well Patient left: with call bell/phone within reach;in chair;with chair alarm set;with family/visitor present Nurse Communication: Mobility status PT Visit Diagnosis: Other abnormalities of gait and mobility (R26.89)     Time: 1325-1350 PT Time Calculation (min) (ACUTE ONLY): 25 min  Charges:    $Gait Training: 8-22 mins $Therapeutic Exercise: 8-22 mins PT General Charges $$ ACUTE PT VISIT: 1 Visit                     Jani Ploeger B. Beverely Risen PT, DPT Acute Rehabilitation Services Please use secure chat or  Call Office [phone]    Elon Alas [facility] 07/29/2023, 2:32 PM

## 2023-07-29 NOTE — Progress Notes (Signed)
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 He underwent left TMA with wound VAC by Dr. Lajoyce Corners on 05/05/2023. Therapy recommended home health but patient has no safe disposition. Per sister's report to [facility], "he was staying with some people because he couldn't stay with sister anymore due to violent behaviors. Sister was bringing food and they were letting him stay for free. And now those people are saying he can't come back and sister can't take him either". TOC filed APS report. Now a placement issue. Wound VAC has been removed, suture removed 9/09.  Patient stable for discharge.  No events from 9/18 to 9/26.   Assessment & Plan:   Principal Problem:   Cellulitis of left foot Active Problems:   Essential hypertension   Type 2 diabetes mellitus with peripheral neuropathy (HCC)   Seizure disorder (HCC)   MDD (major depressive disorder), recurrent episode, moderate (HCC)   History of traumatic brain injury   Cutaneous abscess of left foot   PAD (peripheral artery disease) (HCC)   Severe protein-calorie malnutrition (HCC)  Left foot cellulitis with gangrene and osteomyelitis: Vascular surgery consulted, suggested no indication for vascular intervention. S/p left TMA and wound VAC placement by Dr. Lajoyce Corners. LLE weight bearing as tolerated.  Stable for discharge. Left foot and ankle bandaged. Pt denies any pain.  No new complaints.    History of TBI/cognitive impairment and intermittent aggressive behaviors/depression Psychiatry consulted appreciate recommendations,  unfortunately patient does not currently have capacity. Continue with Risperdal, Prozac  and hydroxyzine. No new complaints.     Type 2 diabetes mellitus with hyperglycemia Continue with sliding scale and semglee 10 units daily.  No changes in meds.    Unsafe living situation: TOC on board for safe disposition. APS is also on board.   Anemia of chronic disease: Monitor H&H intermittently Hemoglobin around 10.9      History of seizures: Continue with Keppra 500 mg twice daily.   Essential hypertension: Blood pressure parameters are well controlled.    Hyponatremia: Resolved.   GERD:  On PPI.     Hyperlipidemia On statin.    RN Pressure Injury Documentation:   Malnutrition Type: Nutrition Problem: Increased nutrient needs Etiology: acute illness (L foot gangrene with osteomyelitis; wound vac)   Malnutrition Characteristics: Signs/Symptoms: estimated needs   Nutrition Interventions: Interventions: Refer to RD note for recommendations, Ensure Enlive (each supplement provides 350kcal and 20 grams of protein), Juven, MVI   Estimated body mass index is 22.02 kg/m as calculated from the following:   Height as of 03/11/23: 5\' 2"  (1.575 m).   Weight as of this encounter: 54.6 kg.   DVT prophylaxis: Lovenox Code Status: Full code Family Communication: No family at bed side. Disposition Plan:     Status is: Inpatient Remains inpatient appropriate because:  Pending safe disposition.   Consultants:  Vascular surgery Orthopedics  Procedures: Status post left TMA and wound VAC placement by Dr. Lajoyce Corners.  Antimicrobials:  Anti-infectives (From admission, onward)    Start     Dose/Rate Route Frequency Ordered Stop   06/03/23 0600  ceFAZolin (ANCEF) IVPB 2g/100 mL premix        2 g 200 mL/hr over 30 Minutes  Intravenous On call to O.R. 06/02/23 1149 06/02/23 1330   06/02/23 2200  ceFAZolin (ANCEF) IVPB 2g/100 mL premix        2 g 200 mL/hr over 30 Minutes Intravenous Every 8 hours 06/02/23 1514 06/03/23 0639   06/02/23 1142  ceFAZolin (ANCEF) 2-4 GM/100ML-%  IVPB       Note to Pharmacy: [facility], [name]: cabinet override      06/02/23 1142 06/02/23 1330   06/01/23 1715  metroNIDAZOLE (FLAGYL) IVPB 500 mg  Status:  Discontinued        500 mg 100 mL/hr over 60 Minutes Intravenous Every 12 hours 06/01/23 1615 06/02/23 1515   05/30/23 0800  ceFAZolin (ANCEF) IVPB 2g/100 mL premix        2 g 200 mL/hr over 30 Minutes Intravenous On call to O.R. 05/30/23 0412 05/30/23 1209   05/28/23 1500  vancomycin (VANCOREADY) IVPB 1250 mg/250 mL  Status:  Discontinued        1,250 mg 166.7 mL/hr over 90 Minutes Intravenous Every 24 hours 05/28/23 1211 06/02/23 1515   05/28/23 1145  vancomycin (VANCOREADY) IVPB 1250 mg/250 mL  Status:  Discontinued        1,250 mg 166.7 mL/hr over 90 Minutes Intravenous Every 24 hours 05/28/23 1051 05/28/23 1211   05/26/23 2030  cefTRIAXone (ROCEPHIN) 2 g in sodium chloride 0.9 % 100 mL IVPB        2 g 200 mL/hr over 30 Minutes Intravenous Every 24 hours 05/26/23 2026 06/01/23 1845      Subjective: Patient was seen and examined at bedside.  Overnight events noted.   Patient was lying comfortably in the bed, denies any concerns.  Objective: Vitals:   07/28/23 1447 07/28/23 2105 07/29/23 0556 07/29/23 0847  BP: 101/62 109/66 110/64 (!) 101/56  Pulse: 67 70 70 74  Resp: 17 16  18   Temp: 98.7 F (37.1 C) 98.1 F (36.7 C) 98.4 F (36.9 C) 98.3 F (36.8 C)  TempSrc: Oral Oral Oral Oral  SpO2: 100% 97% 98% 97%  Weight:        Intake/Output Summary (Last 24 hours) at 07/29/2023 1344 Last data filed at 07/29/2023 1205 Gross per 24 hour  Intake 480 ml  Output 1175 ml  Net -695 ml   Filed Weights   05/26/23 2321  Weight: 54.6 kg    Examination:  General exam: Appears calm and comfortable, not in any distress. Respiratory system: Clear to auscultation bilaterally. Respiratory effort normal.  RR 13 Cardiovascular system: S1 & S2 heard, RRR. No pedal edema. Gastrointestinal system: Abdomen is nondistended, soft and  nontender. . Normal bowel sounds heard. Central nervous system: Alert and oriented x3. No focal neurological deficits. Extremities: Left TMA amputation. Skin: No rashes, lesions or ulcers Psychiatry: Judgement and insight appear normal. Mood & affect appropriate.     Data Reviewed: I have personally reviewed following labs and imaging studies  CBC: Recent Labs  Lab 07/27/23 0425  WBC 5.3  NEUTROABS 3.2  HGB 10.9*  HCT 31.8*  MCV 87.8  PLT 196   Basic Metabolic Panel: Recent Labs  Lab 07/27/23 0425  NA 136  K 4.2  CL 104  CO2 26  GLUCOSE 105*  BUN 30*  CREATININE 1.06  CALCIUM 8.9   GFR: Estimated Creatinine Clearance: 59.4 mL/min (by C-G formula based on SCr of 1.06 mg/dL). Liver Function Tests: No results for input(s): "AST", "ALT", "ALKPHOS", "BILITOT", "PROT", "ALBUMIN" in the last 168 hours. No results for input(s): "LIPASE", "AMYLASE" in  the last 168 hours. No results for input(s): "AMMONIA" in the last 168 hours. Coagulation Profile: No results for input(s): "INR", "PROTIME" in the last 168 hours. Cardiac Enzymes: No results for input(s): "CKTOTAL", "CKMB", "CKMBINDEX", "TROPONINI" in the last 168 hours. BNP (last 3 results) No results for input(s): "PROBNP" in the last 8760 hours. HbA1C: No results for input(s): "HGBA1C" in the last 72 hours. CBG: Recent Labs  Lab 07/28/23 1129 07/28/23 1730 07/28/23 2105 07/29/23 0808 07/29/23 1150  GLUCAP 101* 187* 105* 132* 147*   Lipid Profile: No results for input(s): "CHOL", "HDL", "LDLCALC", "TRIG", "CHOLHDL", "LDLDIRECT" in the last 72 hours. Thyroid Function Tests: No results for input(s): "TSH", "T4TOTAL", "FREET4", "T3FREE", "THYROIDAB" in the last 72 hours. Anemia Panel: No results for input(s): "VITAMINB12", "FOLATE", "FERRITIN", "TIBC", "IRON", "RETICCTPCT" in the last 72 hours. Sepsis Labs: No results for input(s): "PROCALCITON", "LATICACIDVEN" in the last 168 hours.  No results found for this  or any previous visit (from the past 240 hour(s)).   Radiology Studies: No results found.  Scheduled Meds:  vitamin C  1,000 mg Oral Daily   atorvastatin  10 mg Oral Daily   enoxaparin (LOVENOX) injection  40 mg Subcutaneous Daily   feeding supplement (GLUCERNA SHAKE)  237 mL Oral BID BM   FLUoxetine  30 mg Oral Daily   insulin aspart  0-5 Units Subcutaneous QHS   insulin aspart  0-9 Units Subcutaneous TID WC   insulin glargine-yfgn  10 Units Subcutaneous QHS   levETIRAcetam  500 mg Oral BID   multivitamins with iron  1 tablet Oral Daily   nutrition supplement (JUVEN)  1 packet Oral BID BM   pantoprazole  40 mg Oral Daily   risperiDONE  0.25 mg Oral QHS   Continuous Infusions:   LOS: 64 days    Time spent: 35 mins    Willeen Niece, MD [facility]   If 7PM-7AM, please contact night-coverage

## 2023-07-30 LAB — GLUCOSE, CAPILLARY
Glucose-Capillary: 113 mg/dL — ABNORMAL HIGH (ref 70–99)
Glucose-Capillary: 118 mg/dL — ABNORMAL HIGH (ref 70–99)
Glucose-Capillary: 99 mg/dL (ref 70–99)
Glucose-Capillary: 110 mg/dL — ABNORMAL HIGH (ref 70–99)

## 2023-07-30 NOTE — Progress Notes (Signed)
Mobility Specialist: Progress Note   07/30/23 1418  Mobility  Activity Ambulated with assistance in hallway  Level of Assistance Contact guard assist, steadying assist  Assistive Device None  Distance Ambulated (ft) 550 ft  LLE Weight Bearing WBAT (with post op shoe)  Activity Response Tolerated well  Mobility Referral Yes  $Mobility charge 1 Mobility  Mobility Specialist Start Time (ACUTE ONLY) 1333  Mobility Specialist Stop Time (ACUTE ONLY) 1343  Mobility Specialist Time Calculation (min) (ACUTE ONLY) 10 min    Pt was agreeable to mobility session - received ambulating in BR by himself. Interpreter Raquel was present and helpful throughout session. Ambulation with UE support improving; less of a need for cues. More personable recently and more aware of his surroundings. Able to recognize and explain activities done the day before with PT. Pt was also able to remember his room number. Returned to room without fault. Left on EOB with all needs met, call bell in reach. Bed alarm on.   [name] Mobility Specialist Please contact via SecureChat or Rehab office at [phone]

## 2023-07-30 NOTE — Plan of Care (Signed)
  Problem: Education: Goal: Ability to describe self-care measures that may prevent or decrease complications (Diabetes Survival Skills Education) will improve Outcome: Progressing   Problem: Coping: Goal: Ability to adjust to condition or change in health will improve Outcome: Progressing   Problem: Metabolic: Goal: Ability to maintain appropriate glucose levels will improve Outcome: Progressing   Problem: Skin Integrity: Goal: Risk for impaired skin integrity will decrease Outcome: Progressing   

## 2023-07-30 NOTE — Progress Notes (Signed)
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 He underwent left TMA with wound VAC by Dr. Lajoyce Corners on 05/05/2023. Therapy recommended home health but patient has no safe disposition. Per sister's report to [facility], "he was staying with some people because he couldn't stay with sister anymore due to violent behaviors. Sister was bringing food and they were letting him stay for free. And now those people are saying he can't come back and sister can't take him either". TOC filed APS report. Now a placement issue. Wound VAC has been removed, suture removed 9/09.  Patient stable for discharge.  No events from 9/18 to 9/27.   Assessment & Plan:   Principal Problem:   Cellulitis of left foot Active Problems:   Essential hypertension   Type 2 diabetes mellitus with peripheral neuropathy (HCC)   Seizure disorder (HCC)   MDD (major depressive disorder), recurrent episode, moderate (HCC)   History of traumatic brain injury   Cutaneous abscess of left foot   PAD (peripheral artery disease) (HCC)   Severe protein-calorie malnutrition (HCC)  Left foot cellulitis with gangrene and osteomyelitis: Vascular surgery consulted, suggested no indication for vascular intervention. S/p left TMA and wound VAC placement by Dr. Lajoyce Corners. LLE weight bearing as tolerated.  Stable for discharge. Left foot and ankle bandaged. Pt denies any pain.  No new complaints.    History of TBI/cognitive impairment and intermittent aggressive behaviors/depression Psychiatry consulted appreciate recommendations,  unfortunately patient does not currently have capacity. Continue with Risperdal, Prozac  and hydroxyzine. No new complaints.     Type 2 diabetes mellitus with hyperglycemia Continue with sliding scale and semglee 10 units daily.  No changes in meds.    Unsafe living situation: TOC on board for safe disposition. APS is also on board.   Anemia of chronic disease: Monitor H&H intermittently Hemoglobin around 10.9      History of seizures: Continue with Keppra 500 mg twice daily.   Essential hypertension: Blood pressure parameters are well controlled.    Hyponatremia: Resolved.   GERD:  On PPI.     Hyperlipidemia On statin.    RN Pressure Injury Documentation:   Malnutrition Type: Nutrition Problem: Increased nutrient needs Etiology: acute illness (L foot gangrene with osteomyelitis; wound vac)   Malnutrition Characteristics: Signs/Symptoms: estimated needs   Nutrition Interventions: Interventions: Refer to RD note for recommendations, Ensure Enlive (each supplement provides 350kcal and 20 grams of protein), Juven, MVI   Estimated body mass index is 22.02 kg/m as calculated from the following:   Height as of 03/11/23: 5\' 2"  (1.575 m).   Weight as of this encounter: 54.6 kg.   DVT prophylaxis: Lovenox Code Status: Full code Family Communication: No family at bed side. Disposition Plan:     Status is: Inpatient Remains inpatient appropriate because:  Pending safe disposition.   Consultants:  Vascular surgery Orthopedics  Procedures: Status post left TMA and wound VAC placement by Dr. Lajoyce Corners.  Antimicrobials:  Anti-infectives (From admission, onward)    Start     Dose/Rate Route Frequency Ordered Stop   06/03/23 0600  ceFAZolin (ANCEF) IVPB 2g/100 mL premix        2 g 200 mL/hr over 30 Minutes  Intravenous On call to O.R. 06/02/23 1149 06/02/23 1330   06/02/23 2200  ceFAZolin (ANCEF) IVPB 2g/100 mL premix        2 g 200 mL/hr over 30 Minutes Intravenous Every 8 hours 06/02/23 1514 06/03/23 0639   06/02/23 1142  ceFAZolin (ANCEF) 2-4 GM/100ML-%  IVPB       Note to Pharmacy: [facility], [name]: cabinet override      06/02/23 1142 06/02/23 1330   06/01/23 1715  metroNIDAZOLE (FLAGYL) IVPB 500 mg  Status:  Discontinued        500 mg 100 mL/hr over 60 Minutes Intravenous Every 12 hours 06/01/23 1615 06/02/23 1515   05/30/23 0800  ceFAZolin (ANCEF) IVPB 2g/100 mL premix        2 g 200 mL/hr over 30 Minutes Intravenous On call to O.R. 05/30/23 0412 05/30/23 1209   05/28/23 1500  vancomycin (VANCOREADY) IVPB 1250 mg/250 mL  Status:  Discontinued        1,250 mg 166.7 mL/hr over 90 Minutes Intravenous Every 24 hours 05/28/23 1211 06/02/23 1515   05/28/23 1145  vancomycin (VANCOREADY) IVPB 1250 mg/250 mL  Status:  Discontinued        1,250 mg 166.7 mL/hr over 90 Minutes Intravenous Every 24 hours 05/28/23 1051 05/28/23 1211   05/26/23 2030  cefTRIAXone (ROCEPHIN) 2 g in sodium chloride 0.9 % 100 mL IVPB        2 g 200 mL/hr over 30 Minutes Intravenous Every 24 hours 05/26/23 2026 06/01/23 1845      Subjective: Patient was seen and examined at bedside.  Overnight events noted.   Patient is lying comfortably in the bed, denies any concerns.  Objective: Vitals:   07/29/23 2059 07/30/23 0358 07/30/23 0849 07/30/23 1306  BP: 123/75 123/80 109/65 98/60  Pulse: 68 76 74 70  Resp: 17 17    Temp: 98 F (36.7 C)  98.4 F (36.9 C) 98.3 F (36.8 C)  TempSrc: Oral  Oral Oral  SpO2: 98% 98% 99% 99%  Weight:        Intake/Output Summary (Last 24 hours) at 07/30/2023 1418 Last data filed at 07/30/2023 0527 Gross per 24 hour  Intake 240 ml  Output 2350 ml  Net -2110 ml   Filed Weights   05/26/23 2321  Weight: 54.6 kg    Examination:  General exam: Appears calm and comfortable, not in any distress. Respiratory system: Clear to auscultation bilaterally. Respiratory effort normal.  RR 14 Cardiovascular system: S1 & S2 heard, RRR. No pedal edema. Gastrointestinal system: Abdomen is nondistended, soft and nontender. . Normal bowel sounds  heard. Central nervous system: Alert and oriented x3. No focal neurological deficits. 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 Skin: No rashes, lesions or ulcers Psychiatry: Judgement and insight appear normal. Mood & affect appropriate.     Data Reviewed: I have personally reviewed following labs and imaging studies  CBC: Recent Labs  Lab 07/27/23 0425  WBC 5.3  NEUTROABS 3.2  HGB 10.9*  HCT 31.8*  MCV 87.8  PLT 196   Basic Metabolic Panel: Recent Labs  Lab 07/27/23 0425  NA 136  K 4.2  CL 104  CO2 26  GLUCOSE 105*  BUN 30*  CREATININE 1.06  CALCIUM 8.9   GFR: Estimated Creatinine Clearance: 59.4 mL/min (by C-G formula based on SCr of 1.06 mg/dL). Liver Function Tests: No results for input(s): "AST", "ALT", "ALKPHOS", "BILITOT", "PROT", "ALBUMIN" in the last 168 hours. No results for input(s): "LIPASE", "AMYLASE" in the  last 168 hours. No results for input(s): "AMMONIA" in the last 168 hours. Coagulation Profile: No results for input(s): "INR", "PROTIME" in the last 168 hours. Cardiac Enzymes: No results for input(s): "CKTOTAL", "CKMB", "CKMBINDEX", "TROPONINI" in the last 168 hours. BNP (last 3 results) No results for input(s): "PROBNP" in the last 8760 hours. HbA1C: No results for input(s): "HGBA1C" in the last 72 hours. CBG: Recent Labs  Lab 07/29/23 1150 07/29/23 1601 07/29/23 2045 07/30/23 0850 07/30/23 1105  GLUCAP 147* 132* 109* 113* 118*   Lipid Profile: No results for input(s): "CHOL", "HDL", "LDLCALC", "TRIG", "CHOLHDL", "LDLDIRECT" in the last 72 hours. Thyroid Function Tests: No results for input(s): "TSH", "T4TOTAL", "FREET4", "T3FREE", "THYROIDAB" in the last 72 hours. Anemia Panel: No results for input(s): "VITAMINB12", "FOLATE", "FERRITIN", "TIBC", "IRON", "RETICCTPCT" in the last 72 hours. Sepsis Labs: No results for input(s): "PROCALCITON", "LATICACIDVEN" in the last 168 hours.  No results found for this or any  previous visit (from the past 240 hour(s)).   Radiology Studies: No results found.  Scheduled Meds:  vitamin C  1,000 mg Oral Daily   atorvastatin  10 mg Oral Daily   enoxaparin (LOVENOX) injection  40 mg Subcutaneous Daily   feeding supplement (GLUCERNA SHAKE)  237 mL Oral BID BM   FLUoxetine  30 mg Oral Daily   insulin aspart  0-5 Units Subcutaneous QHS   insulin aspart  0-9 Units Subcutaneous TID WC   insulin glargine-yfgn  10 Units Subcutaneous QHS   levETIRAcetam  500 mg Oral BID   multivitamins with iron  1 tablet Oral Daily   nutrition supplement (JUVEN)  1 packet Oral BID BM   pantoprazole  40 mg Oral Daily   risperiDONE  0.25 mg Oral QHS   Continuous Infusions:   LOS: 65 days    Time spent: 35 mins    Willeen Niece, MD [facility]   If 7PM-7AM, please contact night-coverage

## 2023-07-31 LAB — GLUCOSE, CAPILLARY
Glucose-Capillary: 83 mg/dL (ref 70–99)
Glucose-Capillary: 116 mg/dL — ABNORMAL HIGH (ref 70–99)
Glucose-Capillary: 153 mg/dL — ABNORMAL HIGH (ref 70–99)
Glucose-Capillary: 110 mg/dL — ABNORMAL HIGH (ref 70–99)

## 2023-07-31 NOTE — Progress Notes (Signed)
   07/31/23 0923  Mobility  Activity Ambulated with assistance in hallway  Level of Assistance Contact guard assist, steadying assist  Assistive Device Other (Comment) (HHA)  Distance Ambulated (ft) 250 ft  LLE Weight Bearing WBAT (with post Op Boot)  Activity Response Tolerated well  Mobility Referral Yes  $Mobility charge 1 Mobility  Mobility Specialist Start Time (ACUTE ONLY) 0910  Mobility Specialist Stop Time (ACUTE ONLY) 0920  Mobility Specialist Time Calculation (min) (ACUTE ONLY) 10 min   Mobility Specialist: Progress Note  Pt agreeable to mobility session - received in bed. Required CG throughout using HHA with quickened shuffled gait. Pt was asymptomatic throughout ambulation, had no complaints, and returned to room w/o fault. Returned to chair with all needs met - call bell within reach. Chair alarm on.   Barnie Mort, BS Mobility Specialist Please contact via SecureChat or Rehab office at [phone].

## 2023-07-31 NOTE — Plan of Care (Signed)
  Problem: Education: Goal: Ability to describe self-care measures that may prevent or decrease complications (Diabetes Survival Skills Education) will improve Outcome: Progressing   Problem: Education: Goal: Knowledge of General Education information will improve Description: Including pain rating scale, medication(s)/side effects and non-pharmacologic comfort measures Outcome: Progressing   Problem: Skin Integrity: Goal: Risk for impaired skin integrity will decrease Outcome: Progressing   Problem: Activity: Goal: Risk for activity intolerance will decrease Outcome: Progressing

## 2023-07-31 NOTE — Progress Notes (Signed)
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 He underwent left TMA with wound VAC by Dr. Lajoyce Corners on 05/05/2023. Therapy recommended home health but patient has no safe disposition. Per sister's report to [facility], "he was staying with some people because he couldn't stay with sister anymore due to violent behaviors. Sister was bringing food and they were letting him stay for free. And now those people are saying he can't come back and sister can't take him either". TOC filed APS report. Now a placement issue. Wound VAC has been removed, suture removed 9/09.  Patient stable for discharge.  No events from 9/18 to 9/28.   Assessment & Plan:   Principal Problem:   Cellulitis of left foot Active Problems:   Essential hypertension   Type 2 diabetes mellitus with peripheral neuropathy (HCC)   Seizure disorder (HCC)   MDD (major depressive disorder), recurrent episode, moderate (HCC)   History of traumatic brain injury   Cutaneous abscess of left foot   PAD (peripheral artery disease) (HCC)   Severe protein-calorie malnutrition (HCC)  Left foot cellulitis with gangrene and osteomyelitis: Vascular surgery consulted, suggested no indication for vascular intervention. S/p left TMA and wound VAC placement by Dr. Lajoyce Corners. LLE weight bearing as tolerated.  Stable for discharge. Left foot and ankle bandaged. Pt denies any pain.  No new complaints.    History of TBI/cognitive impairment and intermittent aggressive behaviors/depression Psychiatry consulted appreciate recommendations,  unfortunately patient does not currently have capacity. Continue with Risperdal, Prozac  and hydroxyzine. No new complaints.     Type 2 diabetes mellitus with hyperglycemia Continue with sliding scale and semglee 10 units daily.  No changes in meds.    Unsafe living situation: TOC on board for safe disposition. APS is also on board.   Anemia of chronic disease: Monitor H&H intermittently Hemoglobin around 10.9      History of seizures: Continue with Keppra 500 mg twice daily.   Essential hypertension: Blood pressure parameters are well controlled.    Hyponatremia: Resolved.   GERD:  On PPI.     Hyperlipidemia On statin.    RN Pressure Injury Documentation:   Malnutrition Type: Nutrition Problem: Increased nutrient needs Etiology: acute illness (L foot gangrene with osteomyelitis; wound vac)   Malnutrition Characteristics: Signs/Symptoms: estimated needs   Nutrition Interventions: Interventions: Refer to RD note for recommendations, Ensure Enlive (each supplement provides 350kcal and 20 grams of protein), Juven, MVI   Estimated body mass index is 22.02 kg/m as calculated from the following:   Height as of 03/11/23: 5\' 2"  (1.575 m).   Weight as of this encounter: 54.6 kg.   DVT prophylaxis: Lovenox Code Status: Full code Family Communication: No family at bed side. Disposition Plan:     Status is: Inpatient Remains inpatient appropriate because:  Pending safe disposition.   Consultants:  Vascular surgery Orthopedics  Procedures: Status post left TMA and wound VAC placement by Dr. Lajoyce Corners.  Antimicrobials:  Anti-infectives (From admission, onward)    Start     Dose/Rate Route Frequency Ordered Stop   06/03/23 0600  ceFAZolin (ANCEF) IVPB 2g/100 mL premix        2 g 200 mL/hr over 30 Minutes  Intravenous On call to O.R. 06/02/23 1149 06/02/23 1330   06/02/23 2200  ceFAZolin (ANCEF) IVPB 2g/100 mL premix        2 g 200 mL/hr over 30 Minutes Intravenous Every 8 hours 06/02/23 1514 06/03/23 0639   06/02/23 1142  ceFAZolin (ANCEF) 2-4 GM/100ML-%  IVPB       Note to Pharmacy: [facility], [name]: cabinet override      06/02/23 1142 06/02/23 1330   06/01/23 1715  metroNIDAZOLE (FLAGYL) IVPB 500 mg  Status:  Discontinued        500 mg 100 mL/hr over 60 Minutes Intravenous Every 12 hours 06/01/23 1615 06/02/23 1515   05/30/23 0800  ceFAZolin (ANCEF) IVPB 2g/100 mL premix        2 g 200 mL/hr over 30 Minutes Intravenous On call to O.R. 05/30/23 0412 05/30/23 1209   05/28/23 1500  vancomycin (VANCOREADY) IVPB 1250 mg/250 mL  Status:  Discontinued        1,250 mg 166.7 mL/hr over 90 Minutes Intravenous Every 24 hours 05/28/23 1211 06/02/23 1515   05/28/23 1145  vancomycin (VANCOREADY) IVPB 1250 mg/250 mL  Status:  Discontinued        1,250 mg 166.7 mL/hr over 90 Minutes Intravenous Every 24 hours 05/28/23 1051 05/28/23 1211   05/26/23 2030  cefTRIAXone (ROCEPHIN) 2 g in sodium chloride 0.9 % 100 mL IVPB        2 g 200 mL/hr over 30 Minutes Intravenous Every 24 hours 05/26/23 2026 06/01/23 1845      Subjective: Patient was seen and examined at bedside.  Overnight events noted.   Patient is lying comfortably in the bed,  denies any concerns.  Objective: Vitals:   07/30/23 1306 07/30/23 2022 07/31/23 0458 07/31/23 0745  BP: 98/60 126/72 (!) 105/59 108/68  Pulse: 70 73 67 68  Resp:  20 16 16   Temp: 98.3 F (36.8 C) 98.5 F (36.9 C) 97.8 F (36.6 C) 98.2 F (36.8 C)  TempSrc: Oral Oral Oral Oral  SpO2: 99% 98% 100% 99%  Weight:        Intake/Output Summary (Last 24 hours) at 07/31/2023 1322 Last data filed at 07/31/2023 0800 Gross per 24 hour  Intake 1540 ml  Output 650 ml  Net 890 ml   Filed Weights   05/26/23 2321  Weight: 54.6 kg    Examination:  General exam: Appears calm and comfortable, not in any distress. Respiratory system: Clear to auscultation bilaterally. Respiratory effort normal.  RR 12 Cardiovascular system: S1 & S2 heard, RRR. No pedal edema. Gastrointestinal system: Abdomen is nondistended, soft and  nontender. . Normal bowel sounds heard. Central nervous system: Alert and oriented x3. No focal neurological deficits. 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 Skin: No rashes, lesions or ulcers Psychiatry: Judgement and insight appear normal. Mood & affect appropriate.     Data Reviewed: I have personally reviewed following labs and imaging studies  CBC: Recent Labs  Lab 07/27/23 0425  WBC 5.3  NEUTROABS 3.2  HGB 10.9*  HCT 31.8*  MCV 87.8  PLT 196   Basic Metabolic Panel: Recent Labs  Lab 07/27/23 0425  NA 136  K 4.2  CL 104  CO2 26  GLUCOSE 105*  BUN 30*  CREATININE 1.06  CALCIUM 8.9   GFR: Estimated Creatinine Clearance: 59.4 mL/min (by C-G formula based on SCr of 1.06 mg/dL). Liver Function Tests: No results for input(s): "AST", "ALT", "ALKPHOS", "BILITOT", "PROT", "ALBUMIN" in the last 168 hours. No results for  input(s): "LIPASE", "AMYLASE" in the last 168 hours. No results for input(s): "AMMONIA" in the last 168 hours. Coagulation Profile: No results for input(s): "INR", "PROTIME" in the last 168 hours. Cardiac Enzymes: No results for input(s): "CKTOTAL", "CKMB", "CKMBINDEX", "TROPONINI" in the last 168 hours. BNP (last 3 results) No results for input(s): "PROBNP" in the last 8760 hours. HbA1C: No results for input(s): "HGBA1C" in the last 72 hours. CBG: Recent Labs  Lab 07/30/23 1105 07/30/23 1701 07/30/23 2105 07/31/23 0746 07/31/23 1156  GLUCAP 118* 99 110* 83 116*   Lipid Profile: No results for input(s): "CHOL", "HDL", "LDLCALC", "TRIG", "CHOLHDL", "LDLDIRECT" in the last 72 hours. Thyroid Function Tests: No results for input(s): "TSH", "T4TOTAL", "FREET4", "T3FREE", "THYROIDAB" in the last 72 hours. Anemia Panel: No results for input(s): "VITAMINB12", "FOLATE", "FERRITIN", "TIBC", "IRON", "RETICCTPCT" in the last 72 hours. Sepsis Labs: No results for input(s): "PROCALCITON", "LATICACIDVEN" in the last 168 hours.  No results  found for this or any previous visit (from the past 240 hour(s)).   Radiology Studies: No results found.  Scheduled Meds:  vitamin C  1,000 mg Oral Daily   atorvastatin  10 mg Oral Daily   enoxaparin (LOVENOX) injection  40 mg Subcutaneous Daily   feeding supplement (GLUCERNA SHAKE)  237 mL Oral BID BM   FLUoxetine  30 mg Oral Daily   insulin aspart  0-5 Units Subcutaneous QHS   insulin aspart  0-9 Units Subcutaneous TID WC   insulin glargine-yfgn  10 Units Subcutaneous QHS   levETIRAcetam  500 mg Oral BID   multivitamins with iron  1 tablet Oral Daily   nutrition supplement (JUVEN)  1 packet Oral BID BM   pantoprazole  40 mg Oral Daily   risperiDONE  0.25 mg Oral QHS   Continuous Infusions:   LOS: 66 days    Time spent: 35 mins    Willeen Niece, MD [facility]   If 7PM-7AM, please contact night-coverage

## 2023-08-01 LAB — GLUCOSE, CAPILLARY
Glucose-Capillary: 113 mg/dL — ABNORMAL HIGH (ref 70–99)
Glucose-Capillary: 112 mg/dL — ABNORMAL HIGH (ref 70–99)
Glucose-Capillary: 136 mg/dL — ABNORMAL HIGH (ref 70–99)
Glucose-Capillary: 90 mg/dL (ref 70–99)
Glucose-Capillary: 94 mg/dL (ref 70–99)

## 2023-08-01 NOTE — Plan of Care (Signed)
Problem: Education: Goal: Ability to describe self-care measures that may prevent or decrease complications (Diabetes Survival Skills Education) will improve Outcome: Not Progressing Goal: Individualized Educational Video(s) Outcome: Not Progressing   Problem: Coping: Goal: Ability to adjust to condition or change in health will improve Outcome: Not Progressing   Problem: Health Behavior/Discharge Planning: Goal: Ability to identify and utilize available resources and services will improve Outcome: Not Progressing Goal: Ability to manage health-related needs will improve Outcome: Not Progressing   Problem: Metabolic: Goal: Ability to maintain appropriate glucose levels will improve Outcome: Not Progressing   Problem: Nutritional: Goal: Progress toward achieving an optimal weight will improve Outcome: Not Progressing   Problem: Skin Integrity: Goal: Risk for impaired skin integrity will decrease Outcome: Not Progressing   Problem: Tissue Perfusion: Goal: Adequacy of tissue perfusion will improve Outcome: Not Progressing   Problem: Education: Goal: Knowledge of General Education information will improve Description: Including pain rating scale, medication(s)/side effects and non-pharmacologic comfort measures Outcome: Not Progressing   Problem: Health Behavior/Discharge Planning: Goal: Ability to manage health-related needs will improve Outcome: Not Progressing   Problem: Clinical Measurements: Goal: Ability to maintain clinical measurements within normal limits will improve Outcome: Not Progressing Goal: Will remain free from infection Outcome: Not Progressing Goal: Diagnostic test results will improve Outcome: Not Progressing Goal: Respiratory complications will improve Outcome: Not Progressing Goal: Cardiovascular complication will be avoided Outcome: Not Progressing   Problem: Activity: Goal: Risk for activity intolerance will decrease Outcome: Not  Progressing   Problem: Nutrition: Goal: Adequate nutrition will be maintained Outcome: Not Progressing   Problem: Coping: Goal: Level of anxiety will decrease Outcome: Not Progressing   Problem: Elimination: Goal: Will not experience complications related to bowel motility Outcome: Not Progressing Goal: Will not experience complications related to urinary retention Outcome: Not Progressing   Problem: Pain Managment: Goal: General experience of comfort will improve Outcome: Not Progressing   Problem: Safety: Goal: Ability to remain free from injury will improve Outcome: Not Progressing   Problem: Skin Integrity: Goal: Risk for impaired skin integrity will decrease Outcome: Not Progressing   Problem: Education: Goal: Knowledge of General Education information will improve Description: Including pain rating scale, medication(s)/side effects and non-pharmacologic comfort measures Outcome: Not Progressing   Problem: Health Behavior/Discharge Planning: Goal: Ability to manage health-related needs will improve Outcome: Not Progressing   Problem: Clinical Measurements: Goal: Ability to maintain clinical measurements within normal limits will improve Outcome: Not Progressing Goal: Will remain free from infection Outcome: Not Progressing Goal: Diagnostic test results will improve Outcome: Not Progressing Goal: Respiratory complications will improve Outcome: Not Progressing Goal: Cardiovascular complication will be avoided Outcome: Not Progressing   Problem: Activity: Goal: Risk for activity intolerance will decrease Outcome: Not Progressing   Problem: Nutrition: Goal: Adequate nutrition will be maintained Outcome: Not Progressing   Problem: Coping: Goal: Level of anxiety will decrease Outcome: Not Progressing   Problem: Elimination: Goal: Will not experience complications related to bowel motility Outcome: Not Progressing Goal: Will not experience complications  related to urinary retention Outcome: Not Progressing   Problem: Pain Managment: Goal: General experience of comfort will improve Outcome: Not Progressing   Problem: Safety: Goal: Ability to remain free from injury will improve Outcome: Not Progressing   Problem: Skin Integrity: Goal: Risk for impaired skin integrity will decrease Outcome: Not Progressing   Problem: Education: Goal: Knowledge of the prescribed therapeutic regimen will improve Outcome: Not Progressing Goal: Ability to verbalize activity precautions or restrictions will improve Outcome: Not Progressing Goal:  Understanding of discharge needs will improve Outcome: Not Progressing   Problem: Activity: Goal: Ability to perform//tolerate increased activity and mobilize with assistive devices will improve Outcome: Not Progressing

## 2023-08-01 NOTE — Progress Notes (Signed)
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 He underwent left TMA with wound VAC by Dr. Lajoyce Corners on 05/05/2023. Therapy recommended home health but patient has no safe disposition. Per sister's report to [facility], "he was staying with some people because he couldn't stay with sister anymore due to violent behaviors. Sister was bringing food and they were letting him stay for free. And now those people are saying he can't come back and sister can't take him either". TOC filed APS report. Now a placement issue. Wound VAC has been removed, suture removed 9/09.  Patient stable for discharge.  No events from 9/18 to 9/28.   Assessment & Plan:   Principal Problem:   Cellulitis of left foot Active Problems:   Essential hypertension   Type 2 diabetes mellitus with peripheral neuropathy (HCC)   Seizure disorder (HCC)   MDD (major depressive disorder), recurrent episode, moderate (HCC)   History of traumatic brain injury   Cutaneous abscess of left foot   PAD (peripheral artery disease) (HCC)   Severe protein-calorie malnutrition (HCC)  Left foot cellulitis with gangrene and osteomyelitis: Vascular surgery consulted, suggested no indication for vascular intervention. S/p left TMA and wound VAC placement by Dr. Lajoyce Corners. LLE weight bearing as tolerated.  Stable for discharge. Left foot and ankle bandaged. Pt denies any pain.  No new complaints.    History of TBI/cognitive impairment and intermittent aggressive behaviors/depression Psychiatry consulted appreciate recommendations,  unfortunately patient does not currently have capacity. Continue with Risperdal, Prozac  and hydroxyzine. No new complaints.     Type 2 diabetes mellitus with hyperglycemia Continue with sliding scale and semglee 10 units daily.  No changes in meds.    Unsafe living situation: TOC on board for safe disposition. APS is also on board.   Anemia of chronic disease: Monitor H&H intermittently Hemoglobin around 10.9      History of seizures: Continue with Keppra 500 mg twice daily.   Essential hypertension: Blood pressure parameters are well controlled.    Hyponatremia: Resolved.   GERD:  On PPI.     Hyperlipidemia On statin.    RN Pressure Injury Documentation:   Malnutrition Type: Nutrition Problem: Increased nutrient needs Etiology: acute illness (L foot gangrene with osteomyelitis; wound vac)   Malnutrition Characteristics: Signs/Symptoms: estimated needs   Nutrition Interventions: Interventions: Refer to RD note for recommendations, Ensure Enlive (each supplement provides 350kcal and 20 grams of protein), Juven, MVI   Estimated body mass index is 22.02 kg/m as calculated from the following:   Height as of 03/11/23: 5\' 2"  (1.575 m).   Weight as of this encounter: 54.6 kg.   DVT prophylaxis: Lovenox Code Status: Full code Family Communication: No family at bed side. Disposition Plan:     Status is: Inpatient Remains inpatient appropriate because:  Pending safe disposition.   Consultants:  Vascular surgery Orthopedics  Procedures: Status post left TMA and wound VAC placement by Dr. Lajoyce Corners.  Antimicrobials:  Anti-infectives (From admission, onward)    Start     Dose/Rate Route Frequency Ordered Stop   06/03/23 0600  ceFAZolin (ANCEF) IVPB 2g/100 mL premix        2 g 200 mL/hr over 30 Minutes  Intravenous On call to O.R. 06/02/23 1149 06/02/23 1330   06/02/23 2200  ceFAZolin (ANCEF) IVPB 2g/100 mL premix        2 g 200 mL/hr over 30 Minutes Intravenous Every 8 hours 06/02/23 1514 06/03/23 0639   06/02/23 1142  ceFAZolin (ANCEF) 2-4 GM/100ML-%  IVPB       Note to Pharmacy: [facility], [name]: cabinet override      06/02/23 1142 06/02/23 1330   06/01/23 1715  metroNIDAZOLE (FLAGYL) IVPB 500 mg  Status:  Discontinued        500 mg 100 mL/hr over 60 Minutes Intravenous Every 12 hours 06/01/23 1615 06/02/23 1515   05/30/23 0800  ceFAZolin (ANCEF) IVPB 2g/100 mL premix        2 g 200 mL/hr over 30 Minutes Intravenous On call to O.R. 05/30/23 0412 05/30/23 1209   05/28/23 1500  vancomycin (VANCOREADY) IVPB 1250 mg/250 mL  Status:  Discontinued        1,250 mg 166.7 mL/hr over 90 Minutes Intravenous Every 24 hours 05/28/23 1211 06/02/23 1515   05/28/23 1145  vancomycin (VANCOREADY) IVPB 1250 mg/250 mL  Status:  Discontinued        1,250 mg 166.7 mL/hr over 90 Minutes Intravenous Every 24 hours 05/28/23 1051 05/28/23 1211   05/26/23 2030  cefTRIAXone (ROCEPHIN) 2 g in sodium chloride 0.9 % 100 mL IVPB        2 g 200 mL/hr over 30 Minutes Intravenous Every 24 hours 05/26/23 2026 06/01/23 1845      Subjective: Patient was seen and examined at bedside.  Overnight events noted.   Patient is lying comfortably in the bed, denies any concerns.  Objective: Vitals:   07/31/23 1619 07/31/23 2046 08/01/23 0511 08/01/23 0811  BP: 120/66 109/68 105/74 124/71  Pulse: 74 65 79 73  Resp: 18 17 16 18   Temp: 98 F (36.7 C)   98.5 F (36.9 C)  TempSrc: Oral   Oral  SpO2: 99% 96% 96% 98%  Weight:        Intake/Output Summary (Last 24 hours) at 08/01/2023 1207 Last data filed at 08/01/2023 0531 Gross per 24 hour  Intake 480 ml  Output 1600 ml  Net -1120 ml   Filed Weights   05/26/23 2321  Weight: 54.6 kg    Examination:  General exam: Appears calm and comfortable, not in any distress. Respiratory system: Clear to auscultation bilaterally. Respiratory effort normal.  RR 12 Cardiovascular system: S1 & S2 heard, RRR. No pedal edema. Gastrointestinal system: Abdomen is nondistended, soft and nontender. . Normal bowel sounds heard. Central  nervous system: Alert and oriented x3. No focal neurological deficits. 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 Skin: No rashes, lesions or ulcers Psychiatry: Judgement and insight appear normal. Mood & affect appropriate.     Data Reviewed: I have personally reviewed following labs and imaging studies  CBC: Recent Labs  Lab 07/27/23 0425  WBC 5.3  NEUTROABS 3.2  HGB 10.9*  HCT 31.8*  MCV 87.8  PLT 196   Basic Metabolic Panel: Recent Labs  Lab 07/27/23 0425  NA 136  K 4.2  CL 104  CO2 26  GLUCOSE 105*  BUN 30*  CREATININE 1.06  CALCIUM 8.9   GFR: Estimated Creatinine Clearance: 59.4 mL/min (by C-G formula based on SCr of 1.06 mg/dL). Liver Function Tests: No results for input(s): "AST", "ALT", "ALKPHOS", "BILITOT", "PROT", "ALBUMIN" in the last 168 hours. No results for input(s): "LIPASE", "AMYLASE" in the last 168 hours.  No results for input(s): "AMMONIA" in the last 168 hours. Coagulation Profile: No results for input(s): "INR", "PROTIME" in the last 168 hours. Cardiac Enzymes: No results for input(s): "CKTOTAL", "CKMB", "CKMBINDEX", "TROPONINI" in the last 168 hours. BNP (last 3 results) No results for input(s): "PROBNP" in the last 8760 hours. HbA1C: No results for input(s): "HGBA1C" in the last 72 hours. CBG: Recent Labs  Lab 07/31/23 0746 07/31/23 1156 07/31/23 1723 07/31/23 2127 08/01/23 1024  GLUCAP 83 116* 153* 110* 113*   Lipid Profile: No results for input(s): "CHOL", "HDL", "LDLCALC", "TRIG", "CHOLHDL", "LDLDIRECT" in the last 72 hours. Thyroid Function Tests: No results for input(s): "TSH", "T4TOTAL", "FREET4", "T3FREE", "THYROIDAB" in the last 72 hours. Anemia Panel: No results for input(s): "VITAMINB12", "FOLATE", "FERRITIN", "TIBC", "IRON", "RETICCTPCT" in the last 72 hours. Sepsis Labs: No results for input(s): "PROCALCITON", "LATICACIDVEN" in the last 168 hours.  No results found for this or any previous visit (from the  past 240 hour(s)).   Radiology Studies: No results found.  Scheduled Meds:  vitamin C  1,000 mg Oral Daily   atorvastatin  10 mg Oral Daily   enoxaparin (LOVENOX) injection  40 mg Subcutaneous Daily   feeding supplement (GLUCERNA SHAKE)  237 mL Oral BID BM   FLUoxetine  30 mg Oral Daily   insulin aspart  0-5 Units Subcutaneous QHS   insulin aspart  0-9 Units Subcutaneous TID WC   insulin glargine-yfgn  10 Units Subcutaneous QHS   levETIRAcetam  500 mg Oral BID   multivitamins with iron  1 tablet Oral Daily   nutrition supplement (JUVEN)  1 packet Oral BID BM   pantoprazole  40 mg Oral Daily   risperiDONE  0.25 mg Oral QHS   Continuous Infusions:   LOS: 67 days    Time spent: 25 mins    Willeen Niece, MD [facility]   If 7PM-7AM, please contact night-coverage

## 2023-08-01 NOTE — Progress Notes (Signed)
   08/01/23 1524  Mobility  Activity Transferred from chair to bed  Level of Assistance Contact guard assist, steadying assist  Assistive Device Other (Comment) (HHA)  Distance Ambulated (ft) 2 ft  LLE Weight Bearing WBAT (with Post Op boot)  Activity Response Tolerated well  Mobility Referral Yes  $Mobility charge 1 Mobility  Mobility Specialist Start Time (ACUTE ONLY) [id number]  Mobility Specialist Stop Time (ACUTE ONLY) 0323  Mobility Specialist Time Calculation (min) (ACUTE ONLY) 5 min   Mobility Specialist: Progress Note  Pt agreeable to mobility session - received in chair. Required CG using HHA with quickened shuffled gait, was able to remember to doff post op boot independently. Pt was asymptomatic throughout session, had no complaints, and returned to the room without fault. Returned to bed with all needs met - call bell within reach. Bed alarm on.    Barnie Mort, BS Mobility Specialist Please contact via SecureChat or Rehab office at [phone].

## 2023-08-01 NOTE — Progress Notes (Signed)
   08/01/23 1225  Mobility  Activity Ambulated with assistance in hallway  Level of Assistance Contact guard assist, steadying assist  Assistive Device Other (Comment) (HHA)  Distance Ambulated (ft) 250 ft  LLE Weight Bearing WBAT (with Post Op Boot)  Activity Response Tolerated fair  Mobility Referral Yes  $Mobility charge 1 Mobility  Mobility Specialist Start Time (ACUTE ONLY) 1214  Mobility Specialist Stop Time (ACUTE ONLY) 1224  Mobility Specialist Time Calculation (min) (ACUTE ONLY) 10 min   Mobility Specialist: Progress Note  Pt agreeable to mobility session - received in bed. Required CG using HHA with quickened shuffled gait, was able to remember and don post op boot independently. Pt was asymptomatic throughout session, had no complaints, and returned to the room without fault. Returned to chair with all needs met - call bell within reach. Chair alarm on.   Barnie Mort, BS Mobility Specialist Please contact via SecureChat or Rehab office at [phone].

## 2023-08-01 NOTE — Plan of Care (Signed)
  Problem: Education: Goal: Ability to describe self-care measures that may prevent or decrease complications (Diabetes Survival Skills Education) will improve Outcome: Progressing   Problem: Education: Goal: Knowledge of General Education information will improve Description: Including pain rating scale, medication(s)/side effects and non-pharmacologic comfort measures Outcome: Progressing   Problem: Activity: Goal: Risk for activity intolerance will decrease Outcome: Progressing   Problem: Nutrition: Goal: Adequate nutrition will be maintained Outcome: Progressing

## 2023-08-02 LAB — GLUCOSE, CAPILLARY
Glucose-Capillary: 137 mg/dL — ABNORMAL HIGH (ref 70–99)
Glucose-Capillary: 96 mg/dL (ref 70–99)
Glucose-Capillary: 134 mg/dL — ABNORMAL HIGH (ref 70–99)
Glucose-Capillary: 113 mg/dL — ABNORMAL HIGH (ref 70–99)

## 2023-08-02 NOTE — Progress Notes (Signed)
Mobility Specialist: Progress Note   08/02/23 1608  Mobility  Activity Ambulated with assistance in hallway  Level of Assistance Contact guard assist, steadying assist  Assistive Device None  Distance Ambulated (ft) 550 ft  LLE Weight Bearing WBAT (with post op boot)  Activity Response Tolerated well  Mobility Referral Yes  $Mobility charge 1 Mobility  Mobility Specialist Start Time (ACUTE ONLY) 1326  Mobility Specialist Stop Time (ACUTE ONLY) 1335  Mobility Specialist Time Calculation (min) (ACUTE ONLY) 9 min    Pt was agreeable to mobility session - received in bed. Ambulating without HHA, still with shuffled small steps but improved steadiness. Seems more aware of his surroundings and personable. Returned to room without fault. Left in bed with all needs met, call bell in reach. Bed alarm on.   Interpreter Raquel was present and helpful throughout session.   [name] Mobility Specialist Please contact via SecureChat or Rehab office at [phone]

## 2023-08-02 NOTE — Progress Notes (Signed)
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 He underwent left TMA with wound VAC by Dr. Lajoyce Corners on 05/05/2023. Therapy recommended home health but patient has no safe disposition. Per sister's report to [facility], "he was staying with some people because he couldn't stay with sister anymore due to violent behaviors. Sister was bringing food and they were letting him stay for free. And now those people are saying he can't come back and sister can't take him either". TOC filed APS report. Now a placement issue. Wound VAC has been removed, suture removed 9/09.  Patient stable for discharge.  No events from 9/18 to 9/28.   Assessment & Plan:   Principal Problem:   Cellulitis of left foot Active Problems:   Essential hypertension   Type 2 diabetes mellitus with peripheral neuropathy (HCC)   Seizure disorder (HCC)   MDD (major depressive disorder), recurrent episode, moderate (HCC)   History of traumatic brain injury   Cutaneous abscess of left foot   PAD (peripheral artery disease) (HCC)   Severe protein-calorie malnutrition (HCC)  Left foot cellulitis with gangrene and osteomyelitis: Vascular surgery consulted, suggested no indication for vascular intervention. S/p left TMA and wound VAC placement by Dr. Lajoyce Corners. LLE weight bearing as tolerated.  Stable for discharge. Left foot and ankle bandaged. Pt denies any pain.  No new complaints.    History of TBI/cognitive impairment and intermittent aggressive behaviors/depression Psychiatry consulted appreciate recommendations,  unfortunately patient does not currently have capacity. Continue with Risperdal, Prozac  and hydroxyzine. No new complaints.     Type 2 diabetes mellitus with hyperglycemia Continue with sliding scale and semglee 10 units daily.  No changes in meds.    Unsafe living situation: TOC on board for safe disposition. APS is also on board.   Anemia of chronic disease: Monitor H&H intermittently Hemoglobin around 10.9      History of seizures: Continue with Keppra 500 mg twice daily.   Essential hypertension: Blood pressure parameters are well controlled.    Hyponatremia: Resolved.   GERD:  On PPI.     Hyperlipidemia On statin.    RN Pressure Injury Documentation:   Malnutrition Type: Nutrition Problem: Increased nutrient needs Etiology: acute illness (L foot gangrene with osteomyelitis; wound vac)   Malnutrition Characteristics: Signs/Symptoms: estimated needs   Nutrition Interventions: Interventions: Refer to RD note for recommendations, Ensure Enlive (each supplement provides 350kcal and 20 grams of protein), Juven, MVI   Estimated body mass index is 22.02 kg/m as calculated from the following:   Height as of 03/11/23: 5\' 2"  (1.575 m).   Weight as of this encounter: 54.6 kg.   DVT prophylaxis: Lovenox Code Status: Full code Family Communication: No family at bed side. Disposition Plan:     Status is: Inpatient Remains inpatient appropriate because:  Pending safe disposition.   Consultants:  Vascular surgery Orthopedics  Procedures: Status post left TMA and wound VAC placement by Dr. Lajoyce Corners.  Antimicrobials:  Anti-infectives (From admission, onward)    Start     Dose/Rate Route Frequency Ordered Stop   06/03/23 0600  ceFAZolin (ANCEF) IVPB 2g/100 mL premix        2 g 200 mL/hr over 30 Minutes  Intravenous On call to O.R. 06/02/23 1149 06/02/23 1330   06/02/23 2200  ceFAZolin (ANCEF) IVPB 2g/100 mL premix        2 g 200 mL/hr over 30 Minutes Intravenous Every 8 hours 06/02/23 1514 06/03/23 0639   06/02/23 1142  ceFAZolin (ANCEF) 2-4 GM/100ML-%  IVPB       Note to Pharmacy: [facility], [name]: cabinet override      06/02/23 1142 06/02/23 1330   06/01/23 1715  metroNIDAZOLE (FLAGYL) IVPB 500 mg  Status:  Discontinued        500 mg 100 mL/hr over 60 Minutes Intravenous Every 12 hours 06/01/23 1615 06/02/23 1515   05/30/23 0800  ceFAZolin (ANCEF) IVPB 2g/100 mL premix        2 g 200 mL/hr over 30 Minutes Intravenous On call to O.R. 05/30/23 0412 05/30/23 1209   05/28/23 1500  vancomycin (VANCOREADY) IVPB 1250 mg/250 mL  Status:  Discontinued        1,250 mg 166.7 mL/hr over 90 Minutes Intravenous Every 24 hours 05/28/23 1211 06/02/23 1515   05/28/23 1145  vancomycin (VANCOREADY) IVPB 1250 mg/250 mL  Status:  Discontinued        1,250 mg 166.7 mL/hr over 90 Minutes Intravenous Every 24 hours 05/28/23 1051 05/28/23 1211   05/26/23 2030  cefTRIAXone (ROCEPHIN) 2 g in sodium chloride 0.9 % 100 mL IVPB        2 g 200 mL/hr over 30 Minutes Intravenous Every 24 hours 05/26/23 2026 06/01/23 1845      Subjective: Patient was seen and examined at bedside.  Overnight events noted.   Patient denies any other concerns.  He slept overnight well.  Objective: Vitals:   08/01/23 0811 08/01/23 1442 08/01/23 2136 08/02/23 0738  BP: 124/71 108/67 126/83 (!) 104/59  Pulse: 73 77 68 75  Resp: 18 18 16 18   Temp: 98.5 F (36.9 C) 98.5 F (36.9 C) 98.7 F (37.1 C) 98.5 F (36.9 C)  TempSrc: Oral Oral Oral Oral  SpO2: 98% 99% 98% 97%  Weight:        Intake/Output Summary (Last 24 hours) at 08/02/2023 1450 Last data filed at 08/02/2023 0730 Gross per 24 hour  Intake --  Output 350 ml  Net -350 ml   Filed Weights   05/26/23 2321  Weight: 54.6 kg    Examination:  General exam: Appears calm and comfortable, not in any distress. Respiratory system: Clear to auscultation bilaterally. Respiratory effort normal.  RR 14 Cardiovascular system: S1 & S2 heard, RRR. No pedal edema. Gastrointestinal system: Abdomen is nondistended, soft and  nontender. . Normal bowel sounds heard. Central nervous system: Alert and oriented x3. No focal neurological deficits. 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 Skin: No rashes, lesions or ulcers Psychiatry: Judgement and insight appear normal. Mood & affect appropriate.     Data Reviewed: I have personally reviewed following labs and imaging studies  CBC: Recent Labs  Lab 07/27/23 0425  WBC 5.3  NEUTROABS 3.2  HGB 10.9*  HCT 31.8*  MCV 87.8  PLT 196   Basic Metabolic Panel: Recent Labs  Lab 07/27/23 0425  NA 136  K 4.2  CL 104  CO2 26  GLUCOSE 105*  BUN 30*  CREATININE 1.06  CALCIUM 8.9   GFR: Estimated Creatinine Clearance: 59.4 mL/min (by C-G formula based on SCr of 1.06 mg/dL). Liver Function Tests: No results for input(s): "AST", "ALT", "ALKPHOS", "BILITOT", "PROT", "ALBUMIN" in the last 168 hours. No results for input(s): "LIPASE", "  AMYLASE" in the last 168 hours. No results for input(s): "AMMONIA" in the last 168 hours. Coagulation Profile: No results for input(s): "INR", "PROTIME" in the last 168 hours. Cardiac Enzymes: No results for input(s): "CKTOTAL", "CKMB", "CKMBINDEX", "TROPONINI" in the last 168 hours. BNP (last 3 results) No results for input(s): "PROBNP" in the last 8760 hours. HbA1C: No results for input(s): "HGBA1C" in the last 72 hours. CBG: Recent Labs  Lab 08/01/23 1601 08/01/23 2202 08/01/23 2217 08/02/23 0737 08/02/23 1115  GLUCAP 136* 94 90 137* 96   Lipid Profile: No results for input(s): "CHOL", "HDL", "LDLCALC", "TRIG", "CHOLHDL", "LDLDIRECT" in the last 72 hours. Thyroid Function Tests: No results for input(s): "TSH", "T4TOTAL", "FREET4", "T3FREE", "THYROIDAB" in the last 72 hours. Anemia Panel: No results for input(s): "VITAMINB12", "FOLATE", "FERRITIN", "TIBC", "IRON", "RETICCTPCT" in the last 72 hours. Sepsis Labs: No results for input(s): "PROCALCITON", "LATICACIDVEN" in the last 168 hours.  No results  found for this or any previous visit (from the past 240 hour(s)).   Radiology Studies: No results found.  Scheduled Meds:  vitamin C  1,000 mg Oral Daily   atorvastatin  10 mg Oral Daily   enoxaparin (LOVENOX) injection  40 mg Subcutaneous Daily   feeding supplement (GLUCERNA SHAKE)  237 mL Oral BID BM   FLUoxetine  30 mg Oral Daily   insulin aspart  0-5 Units Subcutaneous QHS   insulin aspart  0-9 Units Subcutaneous TID WC   insulin glargine-yfgn  10 Units Subcutaneous QHS   levETIRAcetam  500 mg Oral BID   multivitamins with iron  1 tablet Oral Daily   nutrition supplement (JUVEN)  1 packet Oral BID BM   pantoprazole  40 mg Oral Daily   risperiDONE  0.25 mg Oral QHS   Continuous Infusions:   LOS: 68 days    Time spent: 25 mins    Willeen Niece, MD [facility]   If 7PM-7AM, please contact night-coverage

## 2023-08-02 NOTE — Plan of Care (Signed)

## 2023-08-03 LAB — GLUCOSE, CAPILLARY
Glucose-Capillary: 145 mg/dL — ABNORMAL HIGH (ref 70–99)
Glucose-Capillary: 119 mg/dL — ABNORMAL HIGH (ref 70–99)
Glucose-Capillary: 110 mg/dL — ABNORMAL HIGH (ref 70–99)

## 2023-08-03 NOTE — Progress Notes (Signed)
Physical Therapy Treatment Patient Details Name: [name] MRN: [id number] DOB: 29-Sep-1966 Today's Date: 08/03/2023   History of Present Illness Pt is a 57 year old male admitted for evaluation of being unable to get up and ambulate, and not eating for 2 days, admitted for left foot gangrene with osteomyelitis. 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    PT Comments  Pt supine in bed with shoes on agreeable to working with therapy. Lunch delivered as pt leaving room. Pt ambulated to novel location, ambulating with festinating gait. With small bout of backwards walking festination improved when he resumed forward gait. Worked on dual tasking and problem solving utilizing foam blocks.Gait is improved when focus shifted to block moving activities. At end of session pt with improved way finding back to his room from novel location.  Pt anxious to eat lunch, once returned to room agreeable to sit up while eating.    If plan is discharge home, recommend the following: A little help with walking and/or transfers;A little help with bathing/dressing/bathroom;Assistance with cooking/housework;Direct supervision/assist for medications management;Assist for transportation;Help with stairs or ramp for entrance   Can travel by private vehicle     No  Equipment Recommendations   (will continue to discern)       Precautions / Restrictions Precautions Precautions: Fall;Other (comment) Precaution Comments: History of TBI with left side weakness and cognitive impairments Required Braces or Orthoses: Other Brace Other Brace: L post op shoe Restrictions Weight Bearing Restrictions: No LLE Weight Bearing: Weight bearing as tolerated Other Position/Activity Restrictions: Upgraded to WBAT with postop shoe.     Mobility  Bed Mobility Overal bed mobility: Needs Assistance Bed Mobility: Supine to Sit      Supine to sit: Modified independent (Device/Increase time)     General bed mobility comments: use of rails to pull up to EoB    Transfers Overall transfer level: Needs assistance Equipment used: Rolling walker (2 wheels) Transfers: Sit to/from Stand Sit to Stand: Contact guard assist           General transfer comment: CGA for safety for power up    Ambulation/Gait Ambulation/Gait assistance: Contact guard assist Gait Distance (Feet): 600 Feet Assistive device: None Gait Pattern/deviations: Shuffle, Staggering left, Staggering right, Wide base of support Gait velocity: unable to change velocity on command, appropriate baseline velocity Gait velocity interpretation: <1.8 ft/sec, indicate of risk for recurrent falls   General Gait Details: starts with fesinating gait, unable to change with cuing but after having patient walk backwards 15 feet gait is more fluid until he stops for dual tasking with blocks, gait after that is fesitnating unless he is focusing on a task like picking up and putting down blocks         Balance Overall balance assessment: Needs assistance Sitting-balance support: No upper extremity supported, Feet supported Sitting balance-Leahy Scale: Normal     Standing balance support: Single extremity supported, During functional activity Standing balance-Leahy Scale: Fair Standing balance comment: looks for support but is able to stabilize with both static and dynamic balance at a contact guard level               High Level Balance Comments: able to pick up items from table with UE support on table            Cognition Arousal: Alert Behavior During Therapy: Impulsive Overall Cognitive Status: History of cognitive impairments - at baseline  General Comments: hx of cognitive impairment, impulsive, requires cues for safety and to ask for assistance. needs repeated cues to maintain corrections         Exercises Other Exercises Other Exercises: bending down to pick up things from the floor    General Comments General comments (skin integrity, edema, etc.): In house interpreter Raquel utilized throughout session      Pertinent Vitals/Pain Pain Assessment Pain Assessment: No/denies pain     PT Goals (current goals can now be found in the care plan section) Acute Rehab PT Goals PT Goal Formulation: With patient Time For Goal Achievement: 08/03/23 Potential to Achieve Goals: Good Progress towards PT goals: Progressing toward goals    Frequency    Min 1X/week       AM-PAC PT "6 Clicks" Mobility   Outcome Measure  Help needed turning from your back to your side while in a flat bed without using bedrails?: None Help needed moving from lying on your back to sitting on the side of a flat bed without using bedrails?: None Help needed moving to and from a bed to a chair (including a wheelchair)?: A Little Help needed standing up from a chair using your arms (e.g., wheelchair or bedside chair)?: A Little Help needed to walk in hospital room?: A Little Help needed climbing 3-5 steps with a railing? : A Little 6 Click Score: 20    End of Session Equipment Utilized During Treatment: Gait belt Activity Tolerance: Patient tolerated treatment well Patient left: with call bell/phone within reach;in chair;with chair alarm set;with family/visitor present Nurse Communication: Mobility status PT Visit Diagnosis: Other abnormalities of gait and mobility (R26.89)     Time: 4098-1191 PT Time Calculation (min) (ACUTE ONLY): 18 min  Charges:    $Therapeutic Activity: 8-22 mins PT General Charges $$ ACUTE PT VISIT: 1 Visit                     Adhya Cocco B. Beverely Risen PT, DPT Acute Rehabilitation Services Please use secure chat or  Call Office [phone]    Elon Alas Fleet 08/03/2023, 2:18 PM

## 2023-08-03 NOTE — Plan of Care (Signed)
  Problem: Education: Goal: Knowledge of General Education information will improve Description: Including pain rating scale, medication(s)/side effects and non-pharmacologic comfort measures Outcome: Progressing   Problem: Activity: Goal: Risk for activity intolerance will decrease Outcome: Progressing   

## 2023-08-03 NOTE — Plan of Care (Signed)
Problem: Education: Goal: Ability to describe self-care measures that may prevent or decrease complications (Diabetes Survival Skills Education) will improve Outcome: Not Progressing Goal: Individualized Educational Video(s) Outcome: Not Progressing   Problem: Coping: Goal: Ability to adjust to condition or change in health will improve Outcome: Not Progressing   Problem: Health Behavior/Discharge Planning: Goal: Ability to identify and utilize available resources and services will improve Outcome: Not Progressing Goal: Ability to manage health-related needs will improve Outcome: Not Progressing   Problem: Metabolic: Goal: Ability to maintain appropriate glucose levels will improve Outcome: Not Progressing   Problem: Nutritional: Goal: Progress toward achieving an optimal weight will improve Outcome: Not Progressing   Problem: Skin Integrity: Goal: Risk for impaired skin integrity will decrease Outcome: Not Progressing   Problem: Tissue Perfusion: Goal: Adequacy of tissue perfusion will improve Outcome: Not Progressing   Problem: Education: Goal: Knowledge of General Education information will improve Description: Including pain rating scale, medication(s)/side effects and non-pharmacologic comfort measures Outcome: Not Progressing   Problem: Health Behavior/Discharge Planning: Goal: Ability to manage health-related needs will improve Outcome: Not Progressing   Problem: Clinical Measurements: Goal: Ability to maintain clinical measurements within normal limits will improve Outcome: Not Progressing Goal: Will remain free from infection Outcome: Not Progressing Goal: Diagnostic test results will improve Outcome: Not Progressing Goal: Respiratory complications will improve Outcome: Not Progressing Goal: Cardiovascular complication will be avoided Outcome: Not Progressing   Problem: Activity: Goal: Risk for activity intolerance will decrease Outcome: Not  Progressing   Problem: Nutrition: Goal: Adequate nutrition will be maintained Outcome: Not Progressing   Problem: Coping: Goal: Level of anxiety will decrease Outcome: Not Progressing   Problem: Elimination: Goal: Will not experience complications related to bowel motility Outcome: Not Progressing Goal: Will not experience complications related to urinary retention Outcome: Not Progressing   Problem: Pain Managment: Goal: General experience of comfort will improve Outcome: Not Progressing   Problem: Safety: Goal: Ability to remain free from injury will improve Outcome: Not Progressing   Problem: Skin Integrity: Goal: Risk for impaired skin integrity will decrease Outcome: Not Progressing   Problem: Education: Goal: Knowledge of General Education information will improve Description: Including pain rating scale, medication(s)/side effects and non-pharmacologic comfort measures Outcome: Not Progressing   Problem: Health Behavior/Discharge Planning: Goal: Ability to manage health-related needs will improve Outcome: Not Progressing   Problem: Clinical Measurements: Goal: Ability to maintain clinical measurements within normal limits will improve Outcome: Not Progressing Goal: Will remain free from infection Outcome: Not Progressing Goal: Diagnostic test results will improve Outcome: Not Progressing Goal: Respiratory complications will improve Outcome: Not Progressing Goal: Cardiovascular complication will be avoided Outcome: Not Progressing   Problem: Activity: Goal: Risk for activity intolerance will decrease Outcome: Not Progressing   Problem: Nutrition: Goal: Adequate nutrition will be maintained Outcome: Not Progressing   Problem: Coping: Goal: Level of anxiety will decrease Outcome: Not Progressing   Problem: Elimination: Goal: Will not experience complications related to bowel motility Outcome: Not Progressing Goal: Will not experience complications  related to urinary retention Outcome: Not Progressing   Problem: Pain Managment: Goal: General experience of comfort will improve Outcome: Not Progressing   Problem: Safety: Goal: Ability to remain free from injury will improve Outcome: Not Progressing   Problem: Skin Integrity: Goal: Risk for impaired skin integrity will decrease Outcome: Not Progressing   Problem: Education: Goal: Knowledge of the prescribed therapeutic regimen will improve Outcome: Not Progressing Goal: Ability to verbalize activity precautions or restrictions will improve Outcome: Not Progressing Goal:  Understanding of discharge needs will improve Outcome: Not Progressing   Problem: Activity: Goal: Ability to perform//tolerate increased activity and mobilize with assistive devices will improve Outcome: Not Progressing

## 2023-08-03 NOTE — Progress Notes (Signed)
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 He underwent left TMA with wound VAC by Dr. Lajoyce Corners on 05/05/2023. Therapy recommended home health but patient has no safe disposition. Per sister's report to [facility], "he was staying with some people because he couldn't stay with sister anymore due to violent behaviors. Sister was bringing food and they were letting him stay for free. And now those people are saying he can't come back and sister can't take him either". TOC filed APS report. Now a placement issue. Wound VAC has been removed, suture removed 9/09.  Patient stable for discharge.  No events from 9/18 to 10/1.   Assessment & Plan:   Principal Problem:   Cellulitis of left foot Active Problems:   Essential hypertension   Type 2 diabetes mellitus with peripheral neuropathy (HCC)   Seizure disorder (HCC)   MDD (major depressive disorder), recurrent episode, moderate (HCC)   History of traumatic brain injury   Cutaneous abscess of left foot   PAD (peripheral artery disease) (HCC)   Severe protein-calorie malnutrition (HCC)  Left foot cellulitis with gangrene and osteomyelitis: Vascular surgery consulted, suggested no indication for vascular intervention. S/p left TMA and wound VAC placement by Dr. Lajoyce Corners. LLE weight bearing as tolerated.  Stable for discharge. Left foot and ankle bandaged. Pt denies any pain.  No new complaints.    History of TBI/cognitive impairment and intermittent aggressive behaviors/depression Psychiatry consulted appreciate recommendations,  unfortunately patient does not currently have capacity. Continue with Risperdal,  Prozac and hydroxyzine. No new complaints.     Type 2 diabetes mellitus with hyperglycemia Continue with sliding scale and semglee 10 units daily.  No changes in meds.    Unsafe living situation: TOC on board for safe disposition. APS is also on board.   Anemia of chronic disease: Monitor H&H intermittently Hemoglobin around 10.9      History of seizures: Continue with Keppra 500 mg twice daily.   Essential hypertension: Blood pressure parameters are well controlled.    Hyponatremia: Resolved.   GERD:  On PPI.     Hyperlipidemia On statin.    RN Pressure Injury Documentation:   Malnutrition Type: Nutrition Problem: Increased nutrient needs Etiology: acute illness (L foot gangrene with osteomyelitis; wound vac)   Malnutrition Characteristics: Signs/Symptoms: estimated needs   Nutrition Interventions: Interventions: Refer to RD note for recommendations, Ensure Enlive (each supplement provides 350kcal and 20 grams of protein), Juven, MVI   Estimated body mass index is 22.02 kg/m as calculated from the following:   Height as of 03/11/23: 5\' 2"  (1.575 m).   Weight as of this encounter: 54.6 kg.   DVT prophylaxis: Lovenox Code Status: Full code Family Communication: No family at bed side. Disposition Plan:     Status is: Inpatient Remains inpatient appropriate because:  Pending safe disposition.   Consultants:  Vascular surgery Orthopedics  Procedures: Status post left TMA and wound VAC placement by Dr. Lajoyce Corners.  Antimicrobials:  Anti-infectives (From admission, onward)    Start     Dose/Rate Route Frequency Ordered Stop   06/03/23 0600  ceFAZolin (ANCEF) IVPB 2g/100 mL premix        2 g 200 mL/hr over 30 Minutes  Intravenous On call to O.R. 06/02/23 1149 06/02/23 1330   06/02/23 2200  ceFAZolin (ANCEF) IVPB 2g/100 mL premix        2 g 200 mL/hr over 30 Minutes Intravenous Every 8 hours 06/02/23 1514 06/03/23 0639   06/02/23 1142  ceFAZolin (ANCEF) 2-4  GM/100ML-% IVPB       Note to Pharmacy: [facility], [name]: cabinet override      06/02/23 1142 06/02/23 1330   06/01/23 1715  metroNIDAZOLE (FLAGYL) IVPB 500 mg  Status:  Discontinued        500 mg 100 mL/hr over 60 Minutes Intravenous Every 12 hours 06/01/23 1615 06/02/23 1515   05/30/23 0800  ceFAZolin (ANCEF) IVPB 2g/100 mL premix        2 g 200 mL/hr over 30 Minutes Intravenous On call to O.R. 05/30/23 0412 05/30/23 1209   05/28/23 1500  vancomycin (VANCOREADY) IVPB 1250 mg/250 mL  Status:  Discontinued        1,250 mg 166.7 mL/hr over 90 Minutes Intravenous Every 24 hours 05/28/23 1211 06/02/23 1515   05/28/23 1145  vancomycin (VANCOREADY) IVPB 1250 mg/250 mL  Status:  Discontinued        1,250 mg 166.7 mL/hr over 90 Minutes Intravenous Every 24 hours 05/28/23 1051 05/28/23 1211   05/26/23 2030  cefTRIAXone (ROCEPHIN) 2 g in sodium chloride 0.9 % 100 mL IVPB        2 g 200 mL/hr over 30 Minutes Intravenous Every 24 hours 05/26/23 2026 06/01/23 1845      Subjective: Patient was seen and examined at bedside.  Overnight events noted.   Patient denies any concerns.  He slept overnight well.  Objective: Vitals:   08/02/23 0738 08/02/23 1518 08/02/23 2005 08/03/23 0718  BP: (!) 104/59 110/65 115/67 102/60  Pulse: 75 70 69 76  Resp: 18 18  18   Temp: 98.5 F (36.9 C) 98.5 F (36.9 C) 97.7 F (36.5 C) 97.8 F (36.6 C)  TempSrc: Oral Oral Oral Oral  SpO2: 97% 98% 98% 96%  Weight:        Intake/Output Summary (Last 24 hours) at 08/03/2023 1450 Last data filed at 08/02/2023 2135 Gross per 24 hour  Intake --  Output 480 ml  Net -480 ml   Filed Weights   05/26/23 2321  Weight: 54.6 kg    Examination:  General exam: Appears calm and comfortable, not in any distress. Respiratory system: CTA bilaterally. Respiratory effort normal.  RR 15 Cardiovascular system: S1 & S2 heard, RRR. No pedal edema. Gastrointestinal system: Abdomen is nondistended, soft and nontender. . Normal  bowel sounds heard. Central nervous system: Alert and oriented x3. No focal neurological deficits. 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 Skin: No rashes, lesions or ulcers Psychiatry: Judgement and insight appear normal. Mood & affect appropriate.     Data Reviewed: I have personally reviewed following labs and imaging studies  CBC: No results for input(s): "WBC", "NEUTROABS", "HGB", "HCT", "MCV", "PLT" in the last 168 hours.  Basic Metabolic Panel: No results for input(s): "NA", "K", "CL", "CO2", "GLUCOSE", "BUN", "CREATININE", "CALCIUM", "MG", "PHOS" in the last 168 hours.  GFR: Estimated Creatinine Clearance: 59.4 mL/min (by C-G formula based on SCr of 1.06 mg/dL). Liver Function Tests: No results for input(s): "AST", "ALT", "ALKPHOS", "BILITOT", "PROT", "ALBUMIN" in the last 168 hours. No results for input(s): "LIPASE", "AMYLASE" in the last 168 hours. No results for input(s): "AMMONIA" in the last 168 hours. Coagulation Profile: No results for input(s): "INR", "PROTIME" in  the last 168 hours. Cardiac Enzymes: No results for input(s): "CKTOTAL", "CKMB", "CKMBINDEX", "TROPONINI" in the last 168 hours. BNP (last 3 results) No results for input(s): "PROBNP" in the last 8760 hours. HbA1C: No results for input(s): "HGBA1C" in the last 72 hours. CBG: Recent Labs  Lab 08/02/23 1115 08/02/23 1628 08/02/23 2057 08/03/23 0717 08/03/23 1104  GLUCAP 96 134* 113* 145* 119*   Lipid Profile: No results for input(s): "CHOL", "HDL", "LDLCALC", "TRIG", "CHOLHDL", "LDLDIRECT" in the last 72 hours. Thyroid Function Tests: No results for input(s): "TSH", "T4TOTAL", "FREET4", "T3FREE", "THYROIDAB" in the last 72 hours. Anemia Panel: No results for input(s): "VITAMINB12", "FOLATE", "FERRITIN", "TIBC", "IRON", "RETICCTPCT" in the last 72 hours. Sepsis Labs: No results for input(s): "PROCALCITON", "LATICACIDVEN" in the last 168 hours.  No results found for this or any  previous visit (from the past 240 hour(s)).   Radiology Studies: No results found.  Scheduled Meds:  vitamin C  1,000 mg Oral Daily   atorvastatin  10 mg Oral Daily   enoxaparin (LOVENOX) injection  40 mg Subcutaneous Daily   feeding supplement (GLUCERNA SHAKE)  237 mL Oral BID BM   FLUoxetine  30 mg Oral Daily   insulin aspart  0-5 Units Subcutaneous QHS   insulin aspart  0-9 Units Subcutaneous TID WC   insulin glargine-yfgn  10 Units Subcutaneous QHS   levETIRAcetam  500 mg Oral BID   multivitamins with iron  1 tablet Oral Daily   nutrition supplement (JUVEN)  1 packet Oral BID BM   pantoprazole  40 mg Oral Daily   risperiDONE  0.25 mg Oral QHS   Continuous Infusions:   LOS: 69 days    Time spent: 25 mins    Willeen Niece, MD [facility]   If 7PM-7AM, please contact night-coverage

## 2023-08-03 NOTE — Progress Notes (Signed)
Mobility Specialist: Progress Note   08/03/23 1603  Mobility  Activity Ambulated with assistance in hallway  Level of Assistance Contact guard assist, steadying assist  Assistive Device None  Distance Ambulated (ft) 550 ft  LLE Weight Bearing WBAT (with post op shoe)  Activity Response Tolerated well  Mobility Referral Yes  $Mobility charge 1 Mobility  Mobility Specialist Start Time (ACUTE ONLY) 1010  Mobility Specialist Stop Time (ACUTE ONLY) 1019  Mobility Specialist Time Calculation (min) (ACUTE ONLY) 9 min    Pt was agreeable to mobility session - received in bed. Required verbal cues to straighten up posture during ambulation. Still taking small shuffled steps, but steadiness improving. Returned to room without fault. Left in bed with all needs met, call bell in reach. Bed alarm on.   [name] Mobility Specialist Please contact via SecureChat or Rehab office at [phone]

## 2023-08-04 LAB — CBC
HCT: 33.6 % — ABNORMAL LOW (ref 39.0–52.0)
Hemoglobin: 11.5 g/dL — ABNORMAL LOW (ref 13.0–17.0)
MCH: 29.8 pg (ref 26.0–34.0)
MCHC: 34.2 g/dL (ref 30.0–36.0)
MCV: 87 fL (ref 80.0–100.0)
Platelets: 198 10*3/uL (ref 150–400)
RBC: 3.86 MIL/uL — ABNORMAL LOW (ref 4.22–5.81)
RDW: 12.7 % (ref 11.5–15.5)
WBC: 5.2 10*3/uL (ref 4.0–10.5)
nRBC: 0 % (ref 0.0–0.2)

## 2023-08-04 LAB — GLUCOSE, CAPILLARY
Glucose-Capillary: 111 mg/dL — ABNORMAL HIGH (ref 70–99)
Glucose-Capillary: 104 mg/dL — ABNORMAL HIGH (ref 70–99)
Glucose-Capillary: 147 mg/dL — ABNORMAL HIGH (ref 70–99)
Glucose-Capillary: 121 mg/dL — ABNORMAL HIGH (ref 70–99)

## 2023-08-04 LAB — BASIC METABOLIC PANEL
Anion gap: 6 (ref 5–15)
BUN: 33 mg/dL — ABNORMAL HIGH (ref 6–20)
CO2: 26 mmol/L (ref 22–32)
Calcium: 9 mg/dL (ref 8.9–10.3)
Chloride: 105 mmol/L (ref 98–111)
Creatinine, Ser: 1.01 mg/dL (ref 0.61–1.24)
GFR, Estimated: >60 mL/min (ref >60)
Glucose, Bld: 109 mg/dL — ABNORMAL HIGH (ref 70–99)
Potassium: 4.2 mmol/L (ref 3.5–5.1)
Sodium: 137 mmol/L (ref 135–145)

## 2023-08-04 LAB — PHOSPHORUS: Phosphorus: 5 mg/dL — ABNORMAL HIGH (ref 2.5–4.6)

## 2023-08-04 LAB — MAGNESIUM: Magnesium: 2.1 mg/dL (ref 1.7–2.4)

## 2023-08-04 NOTE — Progress Notes (Signed)
PROGRESS NOTE  [name]:[id number] DOB: September 02, 1966   PCP: Marcine Matar, MD  Patient is from: Home.  Lives with friend.  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 He underwent left TMA with wound VAC by Dr. Lajoyce Corners on 05/05/2023. Therapy recommended home health but patient has no safe disposition. Per sister's report to [facility], "he was staying with some people because he couldn't stay with sister anymore due to violent behaviors. Sister was bringing food and they were letting him stay for free. And now those people are saying he can't come back and sister can't take him either". TOC filed APS report. Now a placement issue. Wound VAC has been removed, suture removed 9/09.  Patient stable for discharge.     Subjective: Seen and examined earlier this morning.  No major events overnight of this morning.  No complaints.  Objective: Vitals:   08/03/23 1921 08/04/23 0411 08/04/23 0837 08/04/23 1344  BP: 106/61 112/68 115/65 121/70  Pulse: 80 62 65 62  Resp: 16 16 17 18   Temp: 98.5 F (36.9 C)  98.3 F (36.8 C) 98.5 F (36.9 C)  TempSrc: Oral  Oral Oral  SpO2: 96% 97% 98% 98%  Weight:        Examination:  GENERAL: No apparent distress.  Nontoxic. HEENT: MMM.  Vision and hearing grossly intact.  NECK: Supple.  No apparent JVD.  RESP:  No IWOB.  Fair aeration bilaterally. CVS:  RRR. Heart sounds normal.  ABD/GI/GU: BS+. Abd soft, NTND.  MSK/EXT:  Moves extremities.  Left TMA. SKIN: no apparent skin lesion or wound NEURO: Awake, alert and oriented appropriately.  No apparent focal neuro deficit. PSYCH: Calm. Normal affect.     Assessment and plan: Left foot cellulitis with gangrene and osteomyelitis: Vascular surgery consulted, suggested no indication for vascular intervention. S/p left TMA and wound VAC by Dr. Lajoyce Corners. LLE.  Wound healed.  Suture removed.  WBAT -Stable for discharge but difficult placement.  History of TBI/cognitive impairment and intermittent aggressive behaviors/depression: Psychiatry consulted appreciate recommendations,  unfortunately patient does not currently have capacity. -Continue with Risperdal, Prozac and hydroxyzine.  Type 2 diabetes mellitus with hyperglycemia -Continue with sliding scale and semglee 10 units daily.     Unsafe living situation: -TOC on board for safe disposition. APS is also on board.   Anemia of chronic disease: Stable -Monitor intermittently    History of seizures: -Continue with Keppra 500 mg twice daily.   Essential hypertension: Normotensive for most part   Hyponatremia: Resolved   GERD:  -On PPI.      Hyperlipidemia -Continue statin   Increased nutrient needs Body mass index is 22.02 kg/m. Nutrition Problem: Increased nutrient needs Etiology: acute illness (L foot gangrene with osteomyelitis; wound vac) Signs/Symptoms: estimated needs Interventions: Refer to RD note for recommendations, Ensure Enlive (each supplement provides 350kcal and 20 grams of protein), Juven, MVI   DVT prophylaxis:  SCD's Start: 06/02/23 1515 enoxaparin (LOVENOX) injection 40 mg Start: 05/27/23 1000  Code Status: Full code Family Communication: None at bedside Level of care: Med-Surg Status is: Inpatient Remains inpatient appropriate because: Lack of safe disposition   Final disposition: SNF? Consultants:  Orthopedic surgery  Psychiatry  25 minutes with more than 50% spent in reviewing records, counseling patient/family and coordinating care.   Sch Meds:  Scheduled Meds:  vitamin C  1,000 mg Oral Daily   atorvastatin  10 mg Oral Daily   enoxaparin  (LOVENOX) injection  40 mg Subcutaneous Daily   feeding supplement (GLUCERNA SHAKE)  237 mL Oral BID BM   FLUoxetine  30 mg Oral Daily   insulin aspart  0-5 Units Subcutaneous QHS   insulin aspart  0-9 Units Subcutaneous TID WC   insulin glargine-yfgn  10 Units Subcutaneous QHS   levETIRAcetam  500 mg Oral BID   multivitamins with iron  1 tablet Oral Daily   nutrition supplement (JUVEN)  1 packet Oral BID BM   pantoprazole  40 mg Oral Daily   risperiDONE  0.25 mg Oral QHS   Continuous Infusions: PRN Meds:.acetaminophen **OR** acetaminophen, alum & mag hydroxide-simeth, bisacodyl, loperamide, ondansetron **OR** ondansetron (ZOFRAN) IV, polyethylene glycol  Antimicrobials: Anti-infectives (From admission, onward)    Start     Dose/Rate Route Frequency Ordered Stop   06/03/23 0600  ceFAZolin (ANCEF) IVPB 2g/100 mL premix        2 g 200 mL/hr over 30 Minutes Intravenous On call to O.R. 06/02/23 1149 06/02/23 1330   06/02/23 2200  ceFAZolin (ANCEF) IVPB 2g/100 mL premix        2 g 200 mL/hr over 30 Minutes Intravenous Every 8 hours 06/02/23 1514 06/03/23 0639   06/02/23 1142  ceFAZolin (ANCEF) 2-4 GM/100ML-% IVPB       Note to Pharmacy: [facility], [name]: cabinet override      06/02/23 1142 06/02/23 1330   06/01/23 1715  metroNIDAZOLE (FLAGYL) IVPB 500 mg  Status:  Discontinued        500 mg 100 mL/hr over 60 Minutes Intravenous Every 12 hours 06/01/23 1615 06/02/23 1515   05/30/23 0800  ceFAZolin (ANCEF) IVPB 2g/100 mL premix        2 g 200 mL/hr over 30 Minutes Intravenous On call to O.R. 05/30/23 0412 05/30/23 1209   05/28/23 1500  vancomycin (VANCOREADY) IVPB 1250 mg/250 mL  Status:  Discontinued        1,250 mg 166.7 mL/hr over 90 Minutes Intravenous Every 24 hours 05/28/23 1211 06/02/23 1515   05/28/23 1145  vancomycin (VANCOREADY) IVPB 1250 mg/250 mL  Status:  Discontinued        1,250 mg 166.7 mL/hr over 90 Minutes Intravenous Every 24 hours 05/28/23 1051 05/28/23 1211    05/26/23 2030  cefTRIAXone (ROCEPHIN) 2 g in sodium chloride 0.9 % 100 mL IVPB        2 g 200 mL/hr over 30 Minutes Intravenous Every 24 hours 05/26/23 2026 06/01/23 1845        I have personally reviewed the following labs and images: CBC: Recent Labs  Lab 08/04/23 0611  WBC 5.2  HGB 11.5*  HCT 33.6*  MCV 87.0  PLT 198   BMP &GFR Recent Labs  Lab 08/04/23 0611  NA 137  K 4.2  CL 105  CO2 26  GLUCOSE 109*  BUN 33*  CREATININE 1.01  CALCIUM 9.0  MG 2.1  PHOS 5.0*   Estimated Creatinine Clearance: 62.3 mL/min (by C-G formula based on SCr of 1.01 mg/dL). Liver & Pancreas: No results for input(s): "AST", "ALT", "ALKPHOS", "BILITOT", "PROT", "ALBUMIN" in the last 168 hours. No results for input(s): "LIPASE", "AMYLASE" in the last 168 hours. No results for input(s): "AMMONIA" in the last 168 hours.  Diabetic: No results for input(s): "HGBA1C" in the last 72 hours. Recent Labs  Lab 08/03/23 0717 08/03/23 1104 08/03/23 1612 08/04/23 0727 08/04/23 1158  GLUCAP 145* 119* 110* 111* 104*   Cardiac Enzymes: No results for input(s): "CKTOTAL", "CKMB", "CKMBINDEX", "TROPONINI" in the last 168 hours. No results for input(s): "PROBNP" in the last 8760 hours. Coagulation Profile: No results for input(s): "INR", "PROTIME" in the last 168 hours. Thyroid Function Tests: No results for input(s): "TSH", "T4TOTAL", "FREET4", "T3FREE", "THYROIDAB" in the last 72 hours. Lipid Profile: No results for input(s): "CHOL", "HDL", "LDLCALC", "TRIG", "CHOLHDL", "LDLDIRECT" in the last 72 hours. Anemia Panel: No results for input(s): "VITAMINB12", "FOLATE", "FERRITIN", "TIBC", "IRON", "RETICCTPCT" in the last 72 hours. Urine analysis:    Component Value Date/Time   COLORURINE AMBER (A) 05/26/2023 2026   APPEARANCEUR CLEAR 05/26/2023 2026   APPEARANCEUR Clear 01/23/2021 1634   LABSPEC 1.020 05/26/2023 2026   PHURINE 5.0 05/26/2023 2026   GLUCOSEU NEGATIVE 05/26/2023 2026   GLUCOSEU  NEGATIVE 10/01/2020 0853   HGBUR NEGATIVE 05/26/2023 2026   BILIRUBINUR NEGATIVE 05/26/2023 2026   BILIRUBINUR Negative 01/23/2021 1634   KETONESUR 5 (A) 05/26/2023 2026   PROTEINUR 100 (A) 05/26/2023 2026   UROBILINOGEN 0.2 10/01/2020 0853   NITRITE NEGATIVE 05/26/2023 2026   LEUKOCYTESUR NEGATIVE 05/26/2023 2026   Sepsis Labs: Invalid input(s): "PROCALCITONIN", "LACTICIDVEN"  Microbiology: No results found for this or any previous visit (from the past 240 hour(s)).  Radiology Studies: No results found.    Kean Gautreau T. Sanad Fearnow [facility]  If 7PM-7AM, please contact night-coverage [email or web address] 08/04/2023, 1:50 PM

## 2023-08-04 NOTE — Plan of Care (Signed)
Problem: Education: Goal: Ability to describe self-care measures that may prevent or decrease complications (Diabetes Survival Skills Education) will improve Outcome: Not Progressing Goal: Individualized Educational Video(s) Outcome: Not Progressing   Problem: Coping: Goal: Ability to adjust to condition or change in health will improve Outcome: Not Progressing   Problem: Health Behavior/Discharge Planning: Goal: Ability to identify and utilize available resources and services will improve Outcome: Not Progressing Goal: Ability to manage health-related needs will improve Outcome: Not Progressing   Problem: Metabolic: Goal: Ability to maintain appropriate glucose levels will improve Outcome: Not Progressing   Problem: Nutritional: Goal: Progress toward achieving an optimal weight will improve Outcome: Not Progressing   Problem: Skin Integrity: Goal: Risk for impaired skin integrity will decrease Outcome: Not Progressing   Problem: Tissue Perfusion: Goal: Adequacy of tissue perfusion will improve Outcome: Not Progressing   Problem: Education: Goal: Knowledge of General Education information will improve Description: Including pain rating scale, medication(s)/side effects and non-pharmacologic comfort measures Outcome: Not Progressing   Problem: Health Behavior/Discharge Planning: Goal: Ability to manage health-related needs will improve Outcome: Not Progressing   Problem: Clinical Measurements: Goal: Ability to maintain clinical measurements within normal limits will improve Outcome: Not Progressing Goal: Will remain free from infection Outcome: Not Progressing Goal: Diagnostic test results will improve Outcome: Not Progressing Goal: Respiratory complications will improve Outcome: Not Progressing Goal: Cardiovascular complication will be avoided Outcome: Not Progressing   Problem: Activity: Goal: Risk for activity intolerance will decrease Outcome: Not  Progressing   Problem: Nutrition: Goal: Adequate nutrition will be maintained Outcome: Not Progressing   Problem: Coping: Goal: Level of anxiety will decrease Outcome: Not Progressing   Problem: Elimination: Goal: Will not experience complications related to bowel motility Outcome: Not Progressing Goal: Will not experience complications related to urinary retention Outcome: Not Progressing   Problem: Pain Managment: Goal: General experience of comfort will improve Outcome: Not Progressing   Problem: Safety: Goal: Ability to remain free from injury will improve Outcome: Not Progressing   Problem: Skin Integrity: Goal: Risk for impaired skin integrity will decrease Outcome: Not Progressing   Problem: Education: Goal: Knowledge of General Education information will improve Description: Including pain rating scale, medication(s)/side effects and non-pharmacologic comfort measures Outcome: Not Progressing   Problem: Health Behavior/Discharge Planning: Goal: Ability to manage health-related needs will improve Outcome: Not Progressing   Problem: Clinical Measurements: Goal: Ability to maintain clinical measurements within normal limits will improve Outcome: Not Progressing Goal: Will remain free from infection Outcome: Not Progressing Goal: Diagnostic test results will improve Outcome: Not Progressing Goal: Respiratory complications will improve Outcome: Not Progressing Goal: Cardiovascular complication will be avoided Outcome: Not Progressing   Problem: Activity: Goal: Risk for activity intolerance will decrease Outcome: Not Progressing   Problem: Nutrition: Goal: Adequate nutrition will be maintained Outcome: Not Progressing   Problem: Coping: Goal: Level of anxiety will decrease Outcome: Not Progressing   Problem: Elimination: Goal: Will not experience complications related to bowel motility Outcome: Not Progressing Goal: Will not experience complications  related to urinary retention Outcome: Not Progressing   Problem: Pain Managment: Goal: General experience of comfort will improve Outcome: Not Progressing   Problem: Safety: Goal: Ability to remain free from injury will improve Outcome: Not Progressing   Problem: Skin Integrity: Goal: Risk for impaired skin integrity will decrease Outcome: Not Progressing   Problem: Education: Goal: Knowledge of the prescribed therapeutic regimen will improve Outcome: Not Progressing Goal: Ability to verbalize activity precautions or restrictions will improve Outcome: Not Progressing Goal:  Understanding of discharge needs will improve Outcome: Not Progressing   Problem: Activity: Goal: Ability to perform//tolerate increased activity and mobilize with assistive devices will improve Outcome: Not Progressing

## 2023-08-04 NOTE — Progress Notes (Signed)
   08/04/23 1527  Mobility  Activity Ambulated with assistance in hallway  Level of Assistance Contact guard assist, steadying assist  Assistive Device Other (Comment) (HHA)  Distance Ambulated (ft) 1100 ft  LLE Weight Bearing WBAT  Activity Response Tolerated well  Mobility Referral Yes  $Mobility charge 1 Mobility  Mobility Specialist Start Time (ACUTE ONLY) 1511  Mobility Specialist Stop Time (ACUTE ONLY) 1526  Mobility Specialist Time Calculation (min) (ACUTE ONLY) 15 min   Mobility Specialist: Progress Note  Pt agreeable to mobility session - received in EOB. Required CG throughout using HHA with shuffling gait. Given verbal cues to slow down his gait. Pt was asymptomatic throughout session, had no complaints, and returned to the room without fault. Returned to chair with all needs met - call bell within reach. Chair alarm on.   Pt was very motivated to walk this session.  Barnie Mort, BS Mobility Specialist Please contact via SecureChat or Rehab office at [phone].

## 2023-08-04 NOTE — Plan of Care (Signed)
  Problem: Education: Goal: Ability to describe self-care measures that may prevent or decrease complications (Diabetes Survival Skills Education) will improve Outcome: Progressing   Problem: Skin Integrity: Goal: Risk for impaired skin integrity will decrease Outcome: Progressing   Problem: Education: Goal: Knowledge of General Education information will improve Description: Including pain rating scale, medication(s)/side effects and non-pharmacologic comfort measures Outcome: Progressing   Problem: Activity: Goal: Risk for activity intolerance will decrease Outcome: Progressing   Problem: Pain Managment: Goal: General experience of comfort will improve Outcome: Progressing   Problem: Safety: Goal: Ability to remain free from injury will improve Outcome: Progressing   Problem: Skin Integrity: Goal: Risk for impaired skin integrity will decrease Outcome: Progressing   

## 2023-08-05 LAB — GLUCOSE, CAPILLARY
Glucose-Capillary: 112 mg/dL — ABNORMAL HIGH (ref 70–99)
Glucose-Capillary: 144 mg/dL — ABNORMAL HIGH (ref 70–99)
Glucose-Capillary: 142 mg/dL — ABNORMAL HIGH (ref 70–99)
Glucose-Capillary: 93 mg/dL (ref 70–99)

## 2023-08-05 NOTE — Progress Notes (Signed)
Physical Therapy Treatment Patient Details Name: [name] MRN: [id number] DOB: Mar 09, 1966 Today's Date: 08/05/2023   History of Present Illness Pt is a 57 year old male admitted for evaluation of being unable to get up and ambulate, and not eating for 2 days, admitted for left foot gangrene with osteomyelitis. 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    PT Comments  With help of interpreter, continue to work on ambulation, and wayfinding. Focus of session, on balance activities on uneven mat surface. Pt with extreme difficulty with multistep tasks, especially when he is being challenged physically. Pt is grossly supervision for simple mobility but requires at least contact guard assist for higher level balance activities.     If plan is discharge home, recommend the following: A little help with walking and/or transfers;A little help with bathing/dressing/bathroom;Assistance with cooking/housework;Direct supervision/assist for medications management;Assist for transportation;Help with stairs or ramp for entrance   Can travel by private vehicle     No  Equipment Recommendations   (will continue to discern)       Precautions / Restrictions Precautions Precautions: Fall;Other (comment) Precaution Comments: History of TBI with left side weakness and cognitive impairments Required Braces or Orthoses: Other Brace Other Brace: L post op shoe Restrictions Weight Bearing Restrictions: No LLE Weight Bearing: Weight bearing as tolerated Other Position/Activity Restrictions: Upgraded to WBAT with postop shoe.     Mobility  Bed Mobility Overal bed mobility: Needs Assistance Bed Mobility: Supine to Sit     Supine to sit: Modified independent (Device/Increase time)     General bed mobility comments: use of rails to pull up to EoB    Transfers Overall transfer level: Needs  assistance Equipment used: Rolling walker (2 wheels) Transfers: Sit to/from Stand Sit to Stand: Contact guard assist           General transfer comment: CGA for safety for power up    Ambulation/Gait Ambulation/Gait assistance: Contact guard assist   Assistive device: None Gait Pattern/deviations: Shuffle, Staggering left, Staggering right, Wide base of support Gait velocity: unable to change velocity on command, appropriate baseline velocity Gait velocity interpretation: <1.8 ft/sec, indicate of risk for recurrent falls   General Gait Details: continues to have period of extremely stiff festinating gait initially which with time and distance improves, continue to work on higher level activities like backward walking which he does not like very much       Balance Overall balance assessment: Needs assistance Sitting-balance support: No upper extremity supported, Feet supported Sitting balance-Leahy Scale: Normal     Standing balance support: Single extremity supported, During functional activity Standing balance-Leahy Scale: Fair Standing balance comment: looks for support but is able to stabilize with both static and dynamic balance at a contact guard level             High level balance activites: Other (comment) High Level Balance Comments: worked on bending down to [employer] post op shoe that was not on securely, also worked            IT consultant Arousal: Alert Behavior During Therapy: Impulsive Overall Cognitive Status: History of cognitive impairments - at baseline                                 General Comments: hx of cognitive impairment, impulsive, requires cues for safety and to ask for assistance. needs repeated  cues to maintain corrections        Exercises Other Exercises Other Exercises: bending down to secure shoe Other Exercises: supported single leg stance on uneven surface Other Exercises: single UE support for single leg  stance Other Exercises: static standing on uneven surface Other Exercises: marching on uneven surface    General Comments General comments (skin integrity, edema, etc.): In house interpreter Raquel utilized throughout session      Pertinent Vitals/Pain  No pain      PT Goals (current goals can now be found in the care plan section) Acute Rehab PT Goals PT Goal Formulation: With patient Time For Goal Achievement: 08/03/23 Potential to Achieve Goals: Good Progress towards PT goals: Progressing toward goals    Frequency    Min 1X/week       AM-PAC PT "6 Clicks" Mobility   Outcome Measure  Help needed turning from your back to your side while in a flat bed without using bedrails?: None Help needed moving from lying on your back to sitting on the side of a flat bed without using bedrails?: None Help needed moving to and from a bed to a chair (including a wheelchair)?: A Little Help needed standing up from a chair using your arms (e.g., wheelchair or bedside chair)?: A Little Help needed to walk in hospital room?: A Little Help needed climbing 3-5 steps with a railing? : A Little 6 Click Score: 20    End of Session Equipment Utilized During Treatment: Gait belt Activity Tolerance: Patient tolerated treatment well Patient left: with call bell/phone within reach;in chair;with chair alarm set;with family/visitor present Nurse Communication: Mobility status PT Visit Diagnosis: Other abnormalities of gait and mobility (R26.89)     Time: 1327-1401 PT Time Calculation (min) (ACUTE ONLY): 34 min  Charges:    $Gait Training: 8-22 mins $Therapeutic Exercise: 8-22 mins PT General Charges $$ ACUTE PT VISIT: 1 Visit                     Emely Fahy B. Beverely Risen PT, DPT Acute Rehabilitation Services Please use secure chat or  Call Office [phone]    [place] [facility] 08/05/2023, 3:39 PM

## 2023-08-05 NOTE — Plan of Care (Signed)

## 2023-08-05 NOTE — Progress Notes (Signed)
PROGRESS NOTE  [name] ZOX:[id number] DOB: 1966/10/09   PCP: Marcine Matar, MD  Patient is from: Home.  Lives with friend.  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 He underwent left TMA with wound VAC by Dr. Lajoyce Corners on 05/05/2023. Therapy recommended home health but patient has no safe disposition. Per sister's report to [facility], "he was staying with some people because he couldn't stay with sister anymore due to violent behaviors. Sister was bringing food and they were letting him stay for free. And now those people are saying he can't come back and sister can't take him either". TOC filed APS report. Now a placement issue. Wound VAC has been removed, suture removed 9/09.  Patient stable for discharge.     Subjective: Seen and examined earlier this morning.  No major events overnight of this morning.  No complaints.  Objective: Vitals:   08/04/23 2014 08/05/23 0421 08/05/23 0722 08/05/23 1249  BP: 114/75 128/73 101/63 110/72  Pulse: 73 75 71 75  Resp: 17 17 16 18   Temp: 98.2 F (36.8 C) 97.6 F (36.4 C) 98.8 F (37.1 C) 98.7 F (37.1 C)  TempSrc: Oral Oral Oral Oral  SpO2: 96% 99% 97% 98%  Weight:        Examination:  GENERAL: No apparent distress.  Nontoxic. HEENT: MMM.  Vision and hearing grossly intact.  NECK: Supple.  No apparent JVD.  RESP:  No IWOB.  Fair aeration bilaterally. CVS:  RRR. Heart sounds normal.  ABD/GI/GU: BS+. Abd soft, NTND.  MSK/EXT: S/p left TMA. NEURO: Awake and alert. Oriented fairly.  No apparent focal neuro deficit. PSYCH: Calm. Normal affect.    Assessment and plan: Left foot  cellulitis with gangrene and osteomyelitis: Vascular surgery consulted, suggested no indication for vascular intervention. S/p left TMA and wound VAC by Dr. Lajoyce Corners. LLE.  Wound healed.  Suture removed.  WBAT -Stable for discharge but difficult placement.  History of TBI/cognitive impairment and intermittent aggressive behaviors/depression: Psychiatry consulted appreciate recommendations,  unfortunately patient does not currently have capacity. -Continue with Risperdal, Prozac and hydroxyzine.  Type 2 diabetes mellitus with hyperglycemia -Continue with sliding scale and semglee 10 units daily.     Unsafe living situation: -TOC on board for safe disposition. APS is also on board.   Anemia of chronic disease: Stable -Monitor intermittently    History of seizures: -Continue with Keppra 500 mg twice daily.   Essential hypertension: Normotensive for most part   Hyponatremia: Resolved   GERD:  -On PPI.      Hyperlipidemia -Continue statin   Increased nutrient needs Body mass index is 22.02 kg/m. Nutrition Problem: Increased nutrient needs Etiology: acute illness (L foot gangrene with osteomyelitis; wound vac) Signs/Symptoms: estimated needs Interventions: Refer to RD note for recommendations, Ensure Enlive (each supplement provides 350kcal and 20 grams of protein), Juven, MVI   DVT prophylaxis:  SCD's Start: 06/02/23 1515 enoxaparin (LOVENOX) injection 40 mg Start: 05/27/23 1000  Code Status: Full code Family Communication: None at bedside Level of care: Med-Surg Status is: Inpatient Remains inpatient appropriate because: Lack of safe disposition   Final disposition: SNF? Consultants:  Orthopedic surgery Psychiatry  25 minutes with more than  50% spent in reviewing records, counseling patient/family and coordinating care.   Sch Meds:  Scheduled Meds:  vitamin C  1,000 mg Oral Daily   atorvastatin  10 mg Oral Daily   enoxaparin (LOVENOX) injection  40 mg  Subcutaneous Daily   feeding supplement (GLUCERNA SHAKE)  237 mL Oral BID BM   FLUoxetine  30 mg Oral Daily   insulin aspart  0-5 Units Subcutaneous QHS   insulin aspart  0-9 Units Subcutaneous TID WC   insulin glargine-yfgn  10 Units Subcutaneous QHS   levETIRAcetam  500 mg Oral BID   multivitamins with iron  1 tablet Oral Daily   nutrition supplement (JUVEN)  1 packet Oral BID BM   pantoprazole  40 mg Oral Daily   risperiDONE  0.25 mg Oral QHS   Continuous Infusions: PRN Meds:.acetaminophen **OR** acetaminophen, alum & mag hydroxide-simeth, bisacodyl, loperamide, ondansetron **OR** ondansetron (ZOFRAN) IV, polyethylene glycol  Antimicrobials: Anti-infectives (From admission, onward)    Start     Dose/Rate Route Frequency Ordered Stop   06/03/23 0600  ceFAZolin (ANCEF) IVPB 2g/100 mL premix        2 g 200 mL/hr over 30 Minutes Intravenous On call to O.R. 06/02/23 1149 06/02/23 1330   06/02/23 2200  ceFAZolin (ANCEF) IVPB 2g/100 mL premix        2 g 200 mL/hr over 30 Minutes Intravenous Every 8 hours 06/02/23 1514 06/03/23 0639   06/02/23 1142  ceFAZolin (ANCEF) 2-4 GM/100ML-% IVPB       Note to Pharmacy: [facility], [name]: cabinet override      06/02/23 1142 06/02/23 1330   06/01/23 1715  metroNIDAZOLE (FLAGYL) IVPB 500 mg  Status:  Discontinued        500 mg 100 mL/hr over 60 Minutes Intravenous Every 12 hours 06/01/23 1615 06/02/23 1515   05/30/23 0800  ceFAZolin (ANCEF) IVPB 2g/100 mL premix        2 g 200 mL/hr over 30 Minutes Intravenous On call to O.R. 05/30/23 0412 05/30/23 1209   05/28/23 1500  vancomycin (VANCOREADY) IVPB 1250 mg/250 mL  Status:  Discontinued        1,250 mg 166.7 mL/hr over 90 Minutes Intravenous Every 24 hours 05/28/23 1211 06/02/23 1515   05/28/23 1145  vancomycin (VANCOREADY) IVPB 1250 mg/250 mL  Status:  Discontinued        1,250 mg 166.7 mL/hr over 90 Minutes Intravenous Every 24 hours 05/28/23 1051 05/28/23 1211   05/26/23 2030  cefTRIAXone  (ROCEPHIN) 2 g in sodium chloride 0.9 % 100 mL IVPB        2 g 200 mL/hr over 30 Minutes Intravenous Every 24 hours 05/26/23 2026 06/01/23 1845        I have personally reviewed the following labs and images: CBC: Recent Labs  Lab 08/04/23 0611  WBC 5.2  HGB 11.5*  HCT 33.6*  MCV 87.0  PLT 198   BMP &GFR Recent Labs  Lab 08/04/23 0611  NA 137  K 4.2  CL 105  CO2 26  GLUCOSE 109*  BUN 33*  CREATININE 1.01  CALCIUM 9.0  MG 2.1  PHOS 5.0*   Estimated Creatinine Clearance: 62.3 mL/min (by C-G formula based on SCr of 1.01 mg/dL). Liver & Pancreas: No results for input(s): "AST", "ALT", "ALKPHOS", "BILITOT", "PROT", "ALBUMIN" in the last 168 hours. No results for input(s): "LIPASE", "AMYLASE" in the last 168 hours. No results for input(s): "AMMONIA" in the last 168 hours. Diabetic: No results for input(s): "HGBA1C" in  the last 72 hours. Recent Labs  Lab 08/04/23 1158 08/04/23 1558 08/04/23 2210 08/05/23 0720 08/05/23 1129  GLUCAP 104* 147* 121* 112* 144*   Cardiac Enzymes: No results for input(s): "CKTOTAL", "CKMB", "CKMBINDEX", "TROPONINI" in the last 168 hours. No results for input(s): "PROBNP" in the last 8760 hours. Coagulation Profile: No results for input(s): "INR", "PROTIME" in the last 168 hours. Thyroid Function Tests: No results for input(s): "TSH", "T4TOTAL", "FREET4", "T3FREE", "THYROIDAB" in the last 72 hours. Lipid Profile: No results for input(s): "CHOL", "HDL", "LDLCALC", "TRIG", "CHOLHDL", "LDLDIRECT" in the last 72 hours. Anemia Panel: No results for input(s): "VITAMINB12", "FOLATE", "FERRITIN", "TIBC", "IRON", "RETICCTPCT" in the last 72 hours. Urine analysis:    Component Value Date/Time   COLORURINE AMBER (A) 05/26/2023 2026   APPEARANCEUR CLEAR 05/26/2023 2026   APPEARANCEUR Clear 01/23/2021 1634   LABSPEC 1.020 05/26/2023 2026   PHURINE 5.0 05/26/2023 2026   GLUCOSEU NEGATIVE 05/26/2023 2026   GLUCOSEU NEGATIVE 10/01/2020 0853    HGBUR NEGATIVE 05/26/2023 2026   BILIRUBINUR NEGATIVE 05/26/2023 2026   BILIRUBINUR Negative 01/23/2021 1634   KETONESUR 5 (A) 05/26/2023 2026   PROTEINUR 100 (A) 05/26/2023 2026   UROBILINOGEN 0.2 10/01/2020 0853   NITRITE NEGATIVE 05/26/2023 2026   LEUKOCYTESUR NEGATIVE 05/26/2023 2026   Sepsis Labs: Invalid input(s): "PROCALCITONIN", "LACTICIDVEN"  Microbiology: No results found for this or any previous visit (from the past 240 hour(s)).  Radiology Studies: No results found.    Lorin Gawron T. Ralyn Stlaurent [facility]  If 7PM-7AM, please contact night-coverage [email or web address] 08/05/2023, 1:47 PM

## 2023-08-05 NOTE — Plan of Care (Signed)
  Problem: Education: Goal: Ability to describe self-care measures that may prevent or decrease complications (Diabetes Survival Skills Education) will improve Outcome: Progressing Goal: Individualized Educational Video(s) Outcome: Progressing   Problem: Coping: Goal: Ability to adjust to condition or change in health will improve Outcome: Progressing   Problem: Health Behavior/Discharge Planning: Goal: Ability to identify and utilize available resources and services will improve Outcome: Progressing Goal: Ability to manage health-related needs will improve Outcome: Progressing   Problem: Clinical Measurements: Goal: Ability to maintain clinical measurements within normal limits will improve Outcome: Progressing Goal: Will remain free from infection Outcome: Progressing Goal: Diagnostic test results will improve Outcome: Progressing Goal: Respiratory complications will improve Outcome: Progressing Goal: Cardiovascular complication will be avoided Outcome: Progressing   Problem: Self-Care: Goal: Ability to meet self-care needs will improve Outcome: Progressing

## 2023-08-06 LAB — GLUCOSE, CAPILLARY
Glucose-Capillary: 90 mg/dL (ref 70–99)
Glucose-Capillary: 89 mg/dL (ref 70–99)
Glucose-Capillary: 128 mg/dL — ABNORMAL HIGH (ref 70–99)
Glucose-Capillary: 123 mg/dL — ABNORMAL HIGH (ref 70–99)

## 2023-08-06 NOTE — Progress Notes (Signed)
PROGRESS NOTE  [name] ION:[id number] DOB: 05-04-1966   PCP: Marcine Matar, MD  Patient is from: Home.  Lives with friend.  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 He underwent left TMA with wound VAC by Dr. Lajoyce Corners on 05/05/2023. Therapy recommended home health but patient has no safe disposition. Per sister's report to [facility], "he was staying with some people because he couldn't stay with sister anymore due to violent behaviors. Sister was bringing food and they were letting him stay for free. And now those people are saying he can't come back and sister can't take him either". TOC filed APS report. Now a placement issue. Wound VAC has been removed, suture removed 9/09.  Patient stable for discharge.     Subjective: Seen and examined earlier this morning.  No major events overnight of this morning.  No complaints.  Objective: Vitals:   08/05/23 1249 08/05/23 1935 08/06/23 0459 08/06/23 0921  BP: 110/72 (!) 109/59 109/71 107/60  Pulse: 75 78 69 73  Resp: 18 16 14 16   Temp: 98.7 F (37.1 C) 97.9 F (36.6 C) (!) 97.5 F (36.4 C) 98.4 F (36.9 C)  TempSrc: Oral  Oral Oral  SpO2: 98% 95% 97% 98%  Weight:        Examination:  GENERAL: No apparent distress.  Nontoxic. HEENT: MMM.  Vision and hearing grossly intact.  NECK: Supple.  No apparent JVD.  RESP:  No IWOB.  Fair aeration bilaterally. CVS:  RRR. Heart sounds normal.  ABD/GI/GU: BS+. Abd soft, NTND.  MSK/EXT: S/p left TMA.  Surgical wound healed completely. NEURO: Awake and alert. Oriented fairly.  No apparent focal neuro deficit. PSYCH: Calm. Normal affect.     Assessment and plan: Left foot cellulitis with gangrene and osteomyelitis:Vascular surgery consulted, suggested no indication for vascular intervention. S/p left TMA and wound VAC by Dr. Lajoyce Corners.  Wound healed.  WBAT. Stable for discharge but difficult placement.  History of TBI/cognitive impairment and intermittent aggressive behaviors/depression: Psychiatry consulted appreciate recommendations,  unfortunately patient does not currently have capacity. -Continue with Risperdal, Prozac and hydroxyzine.  Type 2 diabetes mellitus with hyperglycemia -Continue with sliding scale and semglee 10 units daily.   Unsafe living situation: -TOC on board for safe disposition. APS is also on board.   Anemia of chronic disease: Stable -Monitor intermittently    History of seizures: -Continue with Keppra 500 mg twice daily.   Essential hypertension: Normotensive for most part   Hyponatremia: Resolved   GERD:  -On PPI.   Hyperlipidemia -Continue statin   Increased nutrient needs Body mass index is 22.02 kg/m. Nutrition Problem: Increased nutrient needs Etiology: acute illness (L foot gangrene with osteomyelitis; wound vac) Signs/Symptoms: estimated needs Interventions: Refer to RD note for recommendations, Ensure Enlive (each supplement provides 350kcal and 20 grams of protein), Juven, MVI   DVT prophylaxis:  SCD's Start: 06/02/23 1515 enoxaparin (LOVENOX) injection 40 mg Start: 05/27/23 1000  Code Status: Full code Family Communication: None at bedside Level of care: Med-Surg Status is: Inpatient Remains inpatient appropriate because: Lack of safe disposition   Final disposition: SNF? Consultants:  Orthopedic surgery Psychiatry  25 minutes with more than 50% spent in  reviewing records, counseling patient/family and coordinating care.   Sch Meds:  Scheduled Meds:  vitamin C  1,000 mg Oral Daily   atorvastatin  10 mg Oral Daily   enoxaparin (LOVENOX) injection  40 mg  Subcutaneous Daily   feeding supplement (GLUCERNA SHAKE)  237 mL Oral BID BM   FLUoxetine  30 mg Oral Daily   insulin aspart  0-5 Units Subcutaneous QHS   insulin aspart  0-9 Units Subcutaneous TID WC   insulin glargine-yfgn  10 Units Subcutaneous QHS   levETIRAcetam  500 mg Oral BID   multivitamins with iron  1 tablet Oral Daily   nutrition supplement (JUVEN)  1 packet Oral BID BM   pantoprazole  40 mg Oral Daily   risperiDONE  0.25 mg Oral QHS   Continuous Infusions: PRN Meds:.acetaminophen **OR** acetaminophen, alum & mag hydroxide-simeth, bisacodyl, loperamide, ondansetron **OR** ondansetron (ZOFRAN) IV, polyethylene glycol  Antimicrobials: Anti-infectives (From admission, onward)    Start     Dose/Rate Route Frequency Ordered Stop   06/03/23 0600  ceFAZolin (ANCEF) IVPB 2g/100 mL premix        2 g 200 mL/hr over 30 Minutes Intravenous On call to O.R. 06/02/23 1149 06/02/23 1330   06/02/23 2200  ceFAZolin (ANCEF) IVPB 2g/100 mL premix        2 g 200 mL/hr over 30 Minutes Intravenous Every 8 hours 06/02/23 1514 06/03/23 0639   06/02/23 1142  ceFAZolin (ANCEF) 2-4 GM/100ML-% IVPB       Note to Pharmacy: [facility], [name]: cabinet override      06/02/23 1142 06/02/23 1330   06/01/23 1715  metroNIDAZOLE (FLAGYL) IVPB 500 mg  Status:  Discontinued        500 mg 100 mL/hr over 60 Minutes Intravenous Every 12 hours 06/01/23 1615 06/02/23 1515   05/30/23 0800  ceFAZolin (ANCEF) IVPB 2g/100 mL premix        2 g 200 mL/hr over 30 Minutes Intravenous On call to O.R. 05/30/23 0412 05/30/23 1209   05/28/23 1500  vancomycin (VANCOREADY) IVPB 1250 mg/250 mL  Status:  Discontinued        1,250 mg 166.7 mL/hr over 90 Minutes Intravenous Every 24 hours 05/28/23 1211 06/02/23 1515   05/28/23 1145  vancomycin (VANCOREADY) IVPB 1250 mg/250 mL  Status:  Discontinued        1,250 mg 166.7 mL/hr over 90 Minutes Intravenous Every 24 hours 05/28/23 1051 05/28/23 1211   05/26/23 2030  cefTRIAXone  (ROCEPHIN) 2 g in sodium chloride 0.9 % 100 mL IVPB        2 g 200 mL/hr over 30 Minutes Intravenous Every 24 hours 05/26/23 2026 06/01/23 1845        I have personally reviewed the following labs and images: CBC: Recent Labs  Lab 08/04/23 0611  WBC 5.2  HGB 11.5*  HCT 33.6*  MCV 87.0  PLT 198   BMP &GFR Recent Labs  Lab 08/04/23 0611  NA 137  K 4.2  CL 105  CO2 26  GLUCOSE 109*  BUN 33*  CREATININE 1.01  CALCIUM 9.0  MG 2.1  PHOS 5.0*   Estimated Creatinine Clearance: 62.3 mL/min (by C-G formula based on SCr of 1.01 mg/dL). Liver & Pancreas: No results for input(s): "AST", "ALT", "ALKPHOS", "BILITOT", "PROT", "ALBUMIN" in the last 168 hours. No results for input(s): "LIPASE", "AMYLASE" in the last 168 hours. No results for input(s): "AMMONIA" in the last 168 hours. Diabetic: No results for input(s): "HGBA1C" in the last 72  hours. Recent Labs  Lab 08/05/23 1129 08/05/23 1645 08/05/23 2119 08/06/23 0845 08/06/23 1102  GLUCAP 144* 142* 93 90 89   Cardiac Enzymes: No results for input(s): "CKTOTAL", "CKMB", "CKMBINDEX", "TROPONINI" in the last 168 hours. No results for input(s): "PROBNP" in the last 8760 hours. Coagulation Profile: No results for input(s): "INR", "PROTIME" in the last 168 hours. Thyroid Function Tests: No results for input(s): "TSH", "T4TOTAL", "FREET4", "T3FREE", "THYROIDAB" in the last 72 hours. Lipid Profile: No results for input(s): "CHOL", "HDL", "LDLCALC", "TRIG", "CHOLHDL", "LDLDIRECT" in the last 72 hours. Anemia Panel: No results for input(s): "VITAMINB12", "FOLATE", "FERRITIN", "TIBC", "IRON", "RETICCTPCT" in the last 72 hours. Urine analysis:    Component Value Date/Time   COLORURINE AMBER (A) 05/26/2023 2026   APPEARANCEUR CLEAR 05/26/2023 2026   APPEARANCEUR Clear 01/23/2021 1634   LABSPEC 1.020 05/26/2023 2026   PHURINE 5.0 05/26/2023 2026   GLUCOSEU NEGATIVE 05/26/2023 2026   GLUCOSEU NEGATIVE 10/01/2020 0853   HGBUR  NEGATIVE 05/26/2023 2026   BILIRUBINUR NEGATIVE 05/26/2023 2026   BILIRUBINUR Negative 01/23/2021 1634   KETONESUR 5 (A) 05/26/2023 2026   PROTEINUR 100 (A) 05/26/2023 2026   UROBILINOGEN 0.2 10/01/2020 0853   NITRITE NEGATIVE 05/26/2023 2026   LEUKOCYTESUR NEGATIVE 05/26/2023 2026   Sepsis Labs: Invalid input(s): "PROCALCITONIN", "LACTICIDVEN"  Microbiology: No results found for this or any previous visit (from the past 240 hour(s)).  Radiology Studies: No results found.    Horris Speros T. Jameison Haji [facility]  If 7PM-7AM, please contact night-coverage [email or web address] 08/06/2023, 12:05 PM

## 2023-08-06 NOTE — Progress Notes (Signed)
Mobility Specialist: Progress Note   08/06/23 1609  Mobility  Activity Ambulated with assistance in hallway  Level of Assistance Contact guard assist, steadying assist  Assistive Device Front wheel walker  Distance Ambulated (ft) 1100 ft  LLE Weight Bearing WBAT (with post op shoe)  Activity Response Tolerated well  Mobility Referral Yes  $Mobility charge 1 Mobility  Mobility Specialist Start Time (ACUTE ONLY) 1342  Mobility Specialist Stop Time (ACUTE ONLY) 1355  Mobility Specialist Time Calculation (min) (ACUTE ONLY) 13 min    Pt was agreeable to mobility session - received in bed. Interpreter Raquel present and helpful throughout session. Required verbal and tactile cues to straighten posture during ambulation. Slightly less shuffled gait than previous session. Ambulated ~870ft with CG, and then ~328ft with SV comfortably. Pt denies feeling unbalanced without CG. Returned to room without fault. Pt was able to state room number. Left in bed with all needs met, call bell in reach. Bed alarm on.   [name] Mobility Specialist Please contact via SecureChat or Rehab office at [phone]

## 2023-08-06 NOTE — Plan of Care (Signed)
  Problem: Coping: Goal: Ability to adjust to condition or change in health will improve Outcome: Progressing   Problem: Health Behavior/Discharge Planning: Goal: Ability to identify and utilize available resources and services will improve Outcome: Progressing   Problem: Health Behavior/Discharge Planning: Goal: Ability to manage health-related needs will improve Outcome: Progressing   Problem: Metabolic: Goal: Ability to maintain appropriate glucose levels will improve Outcome: Progressing   Problem: Education: Goal: Knowledge of General Education information will improve Description: Including pain rating scale, medication(s)/side effects and non-pharmacologic comfort measures Outcome: Progressing   Problem: Skin Integrity: Goal: Risk for impaired skin integrity will decrease Outcome: Progressing   Problem: Education: Goal: Knowledge of General Education information will improve Description: Including pain rating scale, medication(s)/side effects and non-pharmacologic comfort measures Outcome: Progressing

## 2023-08-06 NOTE — Plan of Care (Signed)
Problem: Education: Goal: Ability to describe self-care measures that may prevent or decrease complications (Diabetes Survival Skills Education) will improve Outcome: Progressing Goal: Individualized Educational Video(s) Outcome: Progressing   Problem: Coping: Goal: Ability to adjust to condition or change in health will improve Outcome: Progressing   Problem: Health Behavior/Discharge Planning: Goal: Ability to identify and utilize available resources and services will improve Outcome: Progressing Goal: Ability to manage health-related needs will improve Outcome: Progressing   Problem: Metabolic: Goal: Ability to maintain appropriate glucose levels will improve Outcome: Progressing   Problem: Nutritional: Goal: Progress toward achieving an optimal weight will improve Outcome: Progressing   Problem: Skin Integrity: Goal: Risk for impaired skin integrity will decrease Outcome: Progressing   Problem: Tissue Perfusion: Goal: Adequacy of tissue perfusion will improve Outcome: Progressing   Problem: Education: Goal: Knowledge of General Education information will improve Description: Including pain rating scale, medication(s)/side effects and non-pharmacologic comfort measures Outcome: Progressing   Problem: Health Behavior/Discharge Planning: Goal: Ability to manage health-related needs will improve Outcome: Progressing   Problem: Clinical Measurements: Goal: Ability to maintain clinical measurements within normal limits will improve Outcome: Progressing Goal: Will remain free from infection Outcome: Progressing Goal: Diagnostic test results will improve Outcome: Progressing Goal: Respiratory complications will improve Outcome: Progressing Goal: Cardiovascular complication will be avoided Outcome: Progressing   Problem: Activity: Goal: Risk for activity intolerance will decrease Outcome: Progressing   Problem: Nutrition: Goal: Adequate nutrition will be  maintained Outcome: Progressing   Problem: Coping: Goal: Level of anxiety will decrease Outcome: Progressing   Problem: Elimination: Goal: Will not experience complications related to bowel motility Outcome: Progressing Goal: Will not experience complications related to urinary retention Outcome: Progressing   Problem: Pain Managment: Goal: General experience of comfort will improve Outcome: Progressing   Problem: Safety: Goal: Ability to remain free from injury will improve Outcome: Progressing   Problem: Skin Integrity: Goal: Risk for impaired skin integrity will decrease Outcome: Progressing   Problem: Education: Goal: Knowledge of General Education information will improve Description: Including pain rating scale, medication(s)/side effects and non-pharmacologic comfort measures Outcome: Progressing   Problem: Health Behavior/Discharge Planning: Goal: Ability to manage health-related needs will improve Outcome: Progressing   Problem: Clinical Measurements: Goal: Ability to maintain clinical measurements within normal limits will improve Outcome: Progressing Goal: Will remain free from infection Outcome: Progressing Goal: Diagnostic test results will improve Outcome: Progressing Goal: Respiratory complications will improve Outcome: Progressing Goal: Cardiovascular complication will be avoided Outcome: Progressing   Problem: Activity: Goal: Risk for activity intolerance will decrease Outcome: Progressing   Problem: Nutrition: Goal: Adequate nutrition will be maintained Outcome: Progressing   Problem: Coping: Goal: Level of anxiety will decrease Outcome: Progressing   Problem: Elimination: Goal: Will not experience complications related to bowel motility Outcome: Progressing Goal: Will not experience complications related to urinary retention Outcome: Progressing   Problem: Pain Managment: Goal: General experience of comfort will improve Outcome:  Progressing   Problem: Safety: Goal: Ability to remain free from injury will improve Outcome: Progressing   Problem: Skin Integrity: Goal: Risk for impaired skin integrity will decrease Outcome: Progressing   Problem: Education: Goal: Knowledge of the prescribed therapeutic regimen will improve Outcome: Progressing Goal: Ability to verbalize activity precautions or restrictions will improve Outcome: Progressing Goal: Understanding of discharge needs will improve Outcome: Progressing   Problem: Activity: Goal: Ability to perform//tolerate increased activity and mobilize with assistive devices will improve Outcome: Progressing   Problem: Clinical Measurements: Goal: Postoperative complications will be avoided or minimized Outcome:  Progressing   Problem: Self-Care: Goal: Ability to meet self-care needs will improve Outcome: Progressing

## 2023-08-07 MED ORDER — METFORMIN HCL 500 MG PO TABS
500.0000 mg | ORAL_TABLET | Freq: Two times a day (BID) | ORAL | Status: DC
  Administered 2023-08-07 – 2023-09-09 (×66): 500 mg via ORAL
  Filled 2023-08-07 (×66): qty 1

## 2023-08-07 NOTE — Progress Notes (Signed)
PROGRESS NOTE  [name] WJX:[id number] DOB: 09-Dec-1965   PCP: Marcine Matar, MD  Patient is from: Home.  Lives with friend.  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 He underwent left TMA with wound VAC by Dr. Lajoyce Corners on 05/05/2023. Therapy recommended home health but patient has no safe disposition. Per sister's report to [facility], "he was staying with some people because he couldn't stay with sister anymore due to violent behaviors. Sister was bringing food and they were letting him stay for free. And now those people are saying he can't come back and sister can't take him either". TOC filed APS report. Now a placement issue. Wound VAC has been removed, suture removed 9/09.  Patient stable for discharge.     Subjective: Seen and examined earlier this morning.  No major events overnight of this morning.  No complaints.  Asking for more coffee.  Objective: Vitals:   08/06/23 0921 08/06/23 1219 08/07/23 0334 08/07/23 0955  BP: 107/60 110/65 124/69 (!) 93/58  Pulse: 73 70 70 73  Resp: 16 18  15   Temp: 98.4 F (36.9 C) 98.5 F (36.9 C) 98.3 F (36.8 C) 98.7 F (37.1 C)  TempSrc: Oral Oral Oral Oral  SpO2: 98% 98% 96% 96%  Weight:        Examination:  GENERAL: No apparent distress.  Nontoxic. HEENT: MMM.  Vision and hearing grossly intact.  NECK: Supple.  No apparent JVD.  RESP:  No IWOB.  Fair aeration bilaterally. CVS:  RRR. Heart sounds normal.  ABD/GI/GU: BS+. Abd soft, NTND.  MSK/EXT: S/p left TMA.  Surgical wound healed completely. NEURO: Awake and alert. Oriented fairly.  No apparent focal neuro deficit. PSYCH:  Calm. Normal affect.    Assessment and plan: Left foot cellulitis with gangrene and osteomyelitis:Vascular surgery consulted, suggested no indication for vascular intervention. S/p left TMA and wound VAC by Dr. Lajoyce Corners.  Wound healed.  WBAT. Stable for discharge but difficult placement.  History of TBI/cognitive impairment and intermittent aggressive behaviors/depression: Psychiatry consulted appreciate recommendations,  unfortunately patient does not currently have capacity. -Continue with Risperdal, Prozac and hydroxyzine.  Controlled DM-2: A1c 5.8% on 7/25.  CBG has been fairly controlled. Recent Labs  Lab 08/05/23 2119 08/06/23 0845 08/06/23 1102 08/06/23 1615 08/06/23 2011  GLUCAP 93 90 89 128* 123*  -Discontinue insulin and CBG monitoring -Start low-dose metformin.  Unsafe living situation: -TOC on board for safe disposition. APS is also on board.   Anemia of chronic disease: Stable -Monitor intermittently    History of seizures: -Continue with Keppra 500 mg twice daily.   Essential hypertension: Normotensive for most part.  Not on meds.   Hyponatremia: Resolved   GERD:  -On PPI.   Hyperlipidemia -Continue statin   Increased nutrient needs Body mass index is 22.02 kg/m. Nutrition Problem: Increased nutrient needs Etiology: acute illness (L foot gangrene with osteomyelitis; wound vac) Signs/Symptoms: estimated needs Interventions: Refer to RD note for recommendations, Ensure Enlive (each supplement provides 350kcal and 20 grams of protein), Juven, MVI   DVT prophylaxis:  SCD's Start: 06/02/23 1515 enoxaparin (LOVENOX) injection 40 mg Start: 05/27/23 1000  Code Status: Full code Family Communication: None at bedside  Level of care: Med-Surg Status is: Inpatient Remains inpatient appropriate because: Lack of safe disposition   Final disposition: SNF? Consultants:  Orthopedic surgery Psychiatry  25 minutes with more than 50% spent in reviewing records,  counseling patient/family and coordinating care.   Sch Meds:  Scheduled Meds:  vitamin C  1,000 mg Oral Daily   atorvastatin  10 mg Oral Daily   enoxaparin (LOVENOX) injection  40 mg Subcutaneous Daily   feeding supplement (GLUCERNA SHAKE)  237 mL Oral BID BM   FLUoxetine  30 mg Oral Daily   insulin glargine-yfgn  10 Units Subcutaneous QHS   levETIRAcetam  500 mg Oral BID   multivitamins with iron  1 tablet Oral Daily   nutrition supplement (JUVEN)  1 packet Oral BID BM   pantoprazole  40 mg Oral Daily   risperiDONE  0.25 mg Oral QHS   Continuous Infusions: PRN Meds:.acetaminophen **OR** acetaminophen, alum & mag hydroxide-simeth, bisacodyl, loperamide, ondansetron **OR** ondansetron (ZOFRAN) IV, polyethylene glycol  Antimicrobials: Anti-infectives (From admission, onward)    Start     Dose/Rate Route Frequency Ordered Stop   06/03/23 0600  ceFAZolin (ANCEF) IVPB 2g/100 mL premix        2 g 200 mL/hr over 30 Minutes Intravenous On call to O.R. 06/02/23 1149 06/02/23 1330   06/02/23 2200  ceFAZolin (ANCEF) IVPB 2g/100 mL premix        2 g 200 mL/hr over 30 Minutes Intravenous Every 8 hours 06/02/23 1514 06/03/23 0639   06/02/23 1142  ceFAZolin (ANCEF) 2-4 GM/100ML-% IVPB       Note to Pharmacy: [facility], [name]: cabinet override      06/02/23 1142 06/02/23 1330   06/01/23 1715  metroNIDAZOLE (FLAGYL) IVPB 500 mg  Status:  Discontinued        500 mg 100 mL/hr over 60 Minutes Intravenous Every 12 hours 06/01/23 1615 06/02/23 1515   05/30/23 0800  ceFAZolin (ANCEF) IVPB 2g/100 mL premix        2 g 200 mL/hr over 30 Minutes Intravenous On call to O.R. 05/30/23 0412 05/30/23 1209   05/28/23 1500  vancomycin (VANCOREADY) IVPB 1250 mg/250 mL  Status:  Discontinued        1,250 mg 166.7 mL/hr over 90 Minutes Intravenous Every 24 hours 05/28/23 1211 06/02/23 1515   05/28/23 1145  vancomycin (VANCOREADY) IVPB 1250 mg/250 mL  Status:  Discontinued        1,250 mg 166.7 mL/hr over 90  Minutes Intravenous Every 24 hours 05/28/23 1051 05/28/23 1211   05/26/23 2030  cefTRIAXone (ROCEPHIN) 2 g in sodium chloride 0.9 % 100 mL IVPB        2 g 200 mL/hr over 30 Minutes Intravenous Every 24 hours 05/26/23 2026 06/01/23 1845        I have personally reviewed the following labs and images: CBC: Recent Labs  Lab 08/04/23 0611  WBC 5.2  HGB 11.5*  HCT 33.6*  MCV 87.0  PLT 198   BMP &GFR Recent Labs  Lab 08/04/23 0611  NA 137  K 4.2  CL 105  CO2 26  GLUCOSE 109*  BUN 33*  CREATININE 1.01  CALCIUM 9.0  MG 2.1  PHOS 5.0*   Estimated Creatinine Clearance: 62.3 mL/min (by C-G formula based on SCr of 1.01 mg/dL). Liver & Pancreas: No results for input(s): "AST", "ALT", "ALKPHOS", "BILITOT", "PROT", "ALBUMIN" in the last 168 hours. No results for input(s): "LIPASE", "AMYLASE" in the last 168 hours. No results for input(s): "AMMONIA"  in the last 168 hours. Diabetic: No results for input(s): "HGBA1C" in the last 72 hours. Recent Labs  Lab 08/05/23 2119 08/06/23 0845 08/06/23 1102 08/06/23 1615 08/06/23 2011  GLUCAP 93 90 89 128* 123*   Cardiac Enzymes: No results for input(s): "CKTOTAL", "CKMB", "CKMBINDEX", "TROPONINI" in the last 168 hours. No results for input(s): "PROBNP" in the last 8760 hours. Coagulation Profile: No results for input(s): "INR", "PROTIME" in the last 168 hours. Thyroid Function Tests: No results for input(s): "TSH", "T4TOTAL", "FREET4", "T3FREE", "THYROIDAB" in the last 72 hours. Lipid Profile: No results for input(s): "CHOL", "HDL", "LDLCALC", "TRIG", "CHOLHDL", "LDLDIRECT" in the last 72 hours. Anemia Panel: No results for input(s): "VITAMINB12", "FOLATE", "FERRITIN", "TIBC", "IRON", "RETICCTPCT" in the last 72 hours. Urine analysis:    Component Value Date/Time   COLORURINE AMBER (A) 05/26/2023 2026   APPEARANCEUR CLEAR 05/26/2023 2026   APPEARANCEUR Clear 01/23/2021 1634   LABSPEC 1.020 05/26/2023 2026   PHURINE 5.0  05/26/2023 2026   GLUCOSEU NEGATIVE 05/26/2023 2026   GLUCOSEU NEGATIVE 10/01/2020 0853   HGBUR NEGATIVE 05/26/2023 2026   BILIRUBINUR NEGATIVE 05/26/2023 2026   BILIRUBINUR Negative 01/23/2021 1634   KETONESUR 5 (A) 05/26/2023 2026   PROTEINUR 100 (A) 05/26/2023 2026   UROBILINOGEN 0.2 10/01/2020 0853   NITRITE NEGATIVE 05/26/2023 2026   LEUKOCYTESUR NEGATIVE 05/26/2023 2026   Sepsis Labs: Invalid input(s): "PROCALCITONIN", "LACTICIDVEN"  Microbiology: No results found for this or any previous visit (from the past 240 hour(s)).  Radiology Studies: No results found.    Kalena Mander T. Ngoc Detjen [facility]  If 7PM-7AM, please contact night-coverage [email or web address] 08/07/2023, 1:09 PM

## 2023-08-07 NOTE — Plan of Care (Signed)
Patient progressing 

## 2023-08-07 NOTE — Progress Notes (Signed)
Mobility Specialist: Progress Note   08/07/23 1254  Mobility  Activity Ambulated with assistance in hallway  Level of Assistance Standby assist, set-up cues, supervision of patient - no hands on  Assistive Device None  Distance Ambulated (ft) 550 ft  LLE Weight Bearing WBAT (with post op shoe)  Activity Response Tolerated well  Mobility Referral Yes  $Mobility charge 1 Mobility  Mobility Specialist Start Time (ACUTE ONLY) [id number]  Mobility Specialist Stop Time (ACUTE ONLY) 1011  Mobility Specialist Time Calculation (min) (ACUTE ONLY) 13 min    Pt was agreeable to mobility session. Interpreter Graciella present and helpful throughout session. Ambulated complete distance with SV. Denies feeling unbalanced throughout session. Pt was able to don/doff tennis shoe and post op shoe, and was able to recall room number on first attempt. Still taking small steps but seems less unsteady. Returned to room without fault. Left in bed with all needs met, call bell in reach. Bed alarm on.   [name] Mobility Specialist Please contact via SecureChat or Rehab office at [phone]

## 2023-08-08 NOTE — Progress Notes (Signed)
Mobility Specialist: Progress Note   08/08/23 1226  Mobility  Activity Ambulated with assistance in hallway  Level of Assistance Standby assist, set-up cues, supervision of patient - no hands on  Assistive Device None  Distance Ambulated (ft) 550 ft  LLE Weight Bearing WBAT (with post op shoe)  Activity Response Tolerated well  Mobility Referral Yes  $Mobility charge 1 Mobility  Mobility Specialist Start Time (ACUTE ONLY) [id number]  Mobility Specialist Stop Time (ACUTE ONLY) 0926  Mobility Specialist Time Calculation (min) (ACUTE ONLY) 9 min    Pt was agreeable to mobility session - received in bed. Required verbal and tactile cues to straighten posture during ambulation. Still taking small, shuffled steps but overall steady. Returned to room without fault and recited room number. Left in bed with all needs met, call bell in reach. Bed alarm on.   [name] Mobility Specialist Please contact via SecureChat or Rehab office at [phone]

## 2023-08-08 NOTE — Plan of Care (Signed)
Problem: Education: Goal: Ability to describe self-care measures that may prevent or decrease complications (Diabetes Survival Skills Education) will improve Outcome: Progressing Goal: Individualized Educational Video(s) Outcome: Progressing   Problem: Coping: Goal: Ability to adjust to condition or change in health will improve Outcome: Progressing   Problem: Health Behavior/Discharge Planning: Goal: Ability to identify and utilize available resources and services will improve Outcome: Progressing Goal: Ability to manage health-related needs will improve Outcome: Progressing   Problem: Metabolic: Goal: Ability to maintain appropriate glucose levels will improve Outcome: Progressing   Problem: Nutritional: Goal: Progress toward achieving an optimal weight will improve Outcome: Progressing   Problem: Skin Integrity: Goal: Risk for impaired skin integrity will decrease Outcome: Progressing   Problem: Tissue Perfusion: Goal: Adequacy of tissue perfusion will improve Outcome: Progressing   Problem: Education: Goal: Knowledge of General Education information will improve Description: Including pain rating scale, medication(s)/side effects and non-pharmacologic comfort measures Outcome: Progressing   Problem: Health Behavior/Discharge Planning: Goal: Ability to manage health-related needs will improve Outcome: Progressing   Problem: Clinical Measurements: Goal: Ability to maintain clinical measurements within normal limits will improve Outcome: Progressing Goal: Will remain free from infection Outcome: Progressing Goal: Diagnostic test results will improve Outcome: Progressing Goal: Respiratory complications will improve Outcome: Progressing Goal: Cardiovascular complication will be avoided Outcome: Progressing   Problem: Activity: Goal: Risk for activity intolerance will decrease Outcome: Progressing   Problem: Nutrition: Goal: Adequate nutrition will be  maintained Outcome: Progressing   Problem: Coping: Goal: Level of anxiety will decrease Outcome: Progressing   Problem: Elimination: Goal: Will not experience complications related to bowel motility Outcome: Progressing Goal: Will not experience complications related to urinary retention Outcome: Progressing   Problem: Pain Managment: Goal: General experience of comfort will improve Outcome: Progressing   Problem: Safety: Goal: Ability to remain free from injury will improve Outcome: Progressing   Problem: Skin Integrity: Goal: Risk for impaired skin integrity will decrease Outcome: Progressing   Problem: Education: Goal: Knowledge of General Education information will improve Description: Including pain rating scale, medication(s)/side effects and non-pharmacologic comfort measures Outcome: Progressing   Problem: Health Behavior/Discharge Planning: Goal: Ability to manage health-related needs will improve Outcome: Progressing   Problem: Clinical Measurements: Goal: Ability to maintain clinical measurements within normal limits will improve Outcome: Progressing Goal: Will remain free from infection Outcome: Progressing Goal: Diagnostic test results will improve Outcome: Progressing Goal: Respiratory complications will improve Outcome: Progressing Goal: Cardiovascular complication will be avoided Outcome: Progressing   Problem: Activity: Goal: Risk for activity intolerance will decrease Outcome: Progressing   Problem: Nutrition: Goal: Adequate nutrition will be maintained Outcome: Progressing   Problem: Coping: Goal: Level of anxiety will decrease Outcome: Progressing   Problem: Elimination: Goal: Will not experience complications related to bowel motility Outcome: Progressing Goal: Will not experience complications related to urinary retention Outcome: Progressing   Problem: Pain Managment: Goal: General experience of comfort will improve Outcome:  Progressing   Problem: Safety: Goal: Ability to remain free from injury will improve Outcome: Progressing   Problem: Skin Integrity: Goal: Risk for impaired skin integrity will decrease Outcome: Progressing   Problem: Education: Goal: Knowledge of the prescribed therapeutic regimen will improve Outcome: Progressing Goal: Ability to verbalize activity precautions or restrictions will improve Outcome: Progressing Goal: Understanding of discharge needs will improve Outcome: Progressing   Problem: Activity: Goal: Ability to perform//tolerate increased activity and mobilize with assistive devices will improve Outcome: Progressing   Problem: Clinical Measurements: Goal: Postoperative complications will be avoided or minimized Outcome:  Progressing   Problem: Self-Care: Goal: Ability to meet self-care needs will improve Outcome: Progressing

## 2023-08-08 NOTE — Plan of Care (Signed)
  Problem: Coping: Goal: Ability to adjust to condition or change in health will improve Outcome: Progressing   Problem: Skin Integrity: Goal: Risk for impaired skin integrity will decrease Outcome: Progressing   Problem: Activity: Goal: Risk for activity intolerance will decrease Outcome: Progressing   

## 2023-08-08 NOTE — Progress Notes (Signed)
PROGRESS NOTE  Khristian Boland LKG:[id number] DOB: [birth year]   PCP: Marcine Matar, MD  Patient is from: Home.  Lives with friend.  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 He underwent left TMA with wound VAC by Dr. Lajoyce Corners on 05/05/2023. Therapy recommended home health but patient has no safe disposition. Per sister's report to [facility], "he was staying with some people because he couldn't stay with sister anymore due to violent behaviors. Sister was bringing food and they were letting him stay for free. And now those people are saying he can't come back and sister can't take him either". TOC filed APS report. Now a placement issue. Wound VAC has been removed, suture removed 9/09.  Patient stable for discharge.     Subjective: Seen and examined earlier this morning.  No major events overnight of this morning.  No complaints.  Objective: Vitals:   08/07/23 1500 08/07/23 2020 08/08/23 0410 08/08/23 0902  BP: 119/78 124/71 106/76 118/77  Pulse: 72 64 77 73  Resp: 17 18 16    Temp: 98.3 F (36.8 C) 98.4 F (36.9 C)  98 F (36.7 C)  TempSrc: Oral     SpO2: 97% 99% 97% 98%  Weight:        Examination:  GENERAL: No apparent distress.  Nontoxic. HEENT: MMM.  Vision and hearing grossly intact.  NECK: Supple.  No apparent JVD.  RESP:  No IWOB.  Fair aeration bilaterally. CVS:  RRR. Heart sounds normal.  ABD/GI/GU: BS+. Abd soft, NTND.  MSK/EXT: S/p left TMA.  Surgical wound healed completely. NEURO: Awake and alert. Oriented fairly.  No apparent focal neuro deficit. PSYCH: Calm. Normal affect.    Assessment and plan: Left foot  cellulitis with gangrene and osteomyelitis:Vascular surgery consulted, suggested no indication for vascular intervention. S/p left TMA and wound VAC by Dr. Lajoyce Corners.  Wound healed.  WBAT. Stable for discharge but difficult placement.  History of TBI/cognitive impairment and intermittent aggressive behaviors/depression: Psychiatry consulted appreciate recommendations,  unfortunately patient does not currently have capacity. -Continue with Risperdal, Prozac and hydroxyzine.  Controlled DM-2: A1c 5.8% on 7/25.  CBG has been fairly controlled. -Discontinued insulin and CBG monitoring -Started low-dose metformin on 10/5.  Unsafe living situation: -TOC and APS involved.   Anemia of chronic disease: Stable -Monitor intermittently    History of seizures: -Continue with Keppra 500 mg twice daily.   Essential hypertension: Normotensive for most part.  Not on meds.   Hyponatremia: Resolved   GERD:  -On PPI.   Hyperlipidemia -Continue statin   Increased nutrient needs Body mass index is 22.02 kg/m. Nutrition Problem: Increased nutrient needs Etiology: acute illness (L foot gangrene with osteomyelitis; wound vac) Signs/Symptoms: estimated needs Interventions: Refer to RD note for recommendations, Ensure Enlive (each supplement provides 350kcal and 20 grams of protein), Juven, MVI   DVT prophylaxis:  SCD's Start: 06/02/23 1515 enoxaparin (LOVENOX) injection 40 mg Start: 05/27/23 1000  Code Status: Full code Family Communication: None at bedside Level of care: Med-Surg Status is: Inpatient Remains inpatient appropriate because: Lack of safe disposition   Final disposition: SNF? Consultants:  Orthopedic surgery Psychiatry  25 minutes with more than 50% spent in reviewing records,  counseling patient/family and coordinating care.   Sch Meds:  Scheduled Meds:  vitamin C  1,000 mg Oral Daily   atorvastatin  10 mg Oral Daily   enoxaparin (LOVENOX) injection  40 mg Subcutaneous Daily    feeding supplement (GLUCERNA SHAKE)  237 mL Oral BID BM   FLUoxetine  30 mg Oral Daily   levETIRAcetam  500 mg Oral BID   metFORMIN  500 mg Oral BID WC   multivitamins with iron  1 tablet Oral Daily   nutrition supplement (JUVEN)  1 packet Oral BID BM   pantoprazole  40 mg Oral Daily   risperiDONE  0.25 mg Oral QHS   Continuous Infusions: PRN Meds:.acetaminophen **OR** acetaminophen, alum & mag hydroxide-simeth, bisacodyl, loperamide, ondansetron **OR** ondansetron (ZOFRAN) IV, polyethylene glycol  Antimicrobials: Anti-infectives (From admission, onward)    Start     Dose/Rate Route Frequency Ordered Stop   06/03/23 0600  ceFAZolin (ANCEF) IVPB 2g/100 mL premix        2 g 200 mL/hr over 30 Minutes Intravenous On call to O.R. 06/02/23 1149 06/02/23 1330   06/02/23 2200  ceFAZolin (ANCEF) IVPB 2g/100 mL premix        2 g 200 mL/hr over 30 Minutes Intravenous Every 8 hours 06/02/23 1514 06/03/23 0639   06/02/23 1142  ceFAZolin (ANCEF) 2-4 GM/100ML-% IVPB       Note to Pharmacy: [facility], [name]: cabinet override      06/02/23 1142 06/02/23 1330   06/01/23 1715  metroNIDAZOLE (FLAGYL) IVPB 500 mg  Status:  Discontinued        500 mg 100 mL/hr over 60 Minutes Intravenous Every 12 hours 06/01/23 1615 06/02/23 1515   05/30/23 0800  ceFAZolin (ANCEF) IVPB 2g/100 mL premix        2 g 200 mL/hr over 30 Minutes Intravenous On call to O.R. 05/30/23 0412 05/30/23 1209   05/28/23 1500  vancomycin (VANCOREADY) IVPB 1250 mg/250 mL  Status:  Discontinued        1,250 mg 166.7 mL/hr over 90 Minutes Intravenous Every 24 hours 05/28/23 1211 06/02/23 1515   05/28/23 1145  vancomycin (VANCOREADY) IVPB 1250 mg/250 mL  Status:  Discontinued        1,250 mg 166.7 mL/hr over 90 Minutes Intravenous Every 24 hours 05/28/23 1051 05/28/23 1211   05/26/23 2030  cefTRIAXone (ROCEPHIN) 2 g in sodium chloride 0.9 % 100 mL IVPB        2 g 200 mL/hr over 30 Minutes Intravenous Every 24 hours 05/26/23 2026  06/01/23 1845        I have personally reviewed the following labs and images: CBC: Recent Labs  Lab 08/04/23 0611  WBC 5.2  HGB 11.5*  HCT 33.6*  MCV 87.0  PLT 198   BMP &GFR Recent Labs  Lab 08/04/23 0611  NA 137  K 4.2  CL 105  CO2 26  GLUCOSE 109*  BUN 33*  CREATININE 1.01  CALCIUM 9.0  MG 2.1  PHOS 5.0*   Estimated Creatinine Clearance: 62.3 mL/min (by C-G formula based on SCr of 1.01 mg/dL). Liver & Pancreas: No results for input(s): "AST", "ALT", "ALKPHOS", "BILITOT", "PROT", "ALBUMIN" in the last 168 hours. No results for input(s): "LIPASE", "AMYLASE" in the last 168 hours. No results for input(s): "AMMONIA" in the last 168 hours. Diabetic: No results for input(s): "HGBA1C" in the last 72 hours. Recent Labs  Lab 08/05/23 2119 08/06/23 0845 08/06/23 1102 08/06/23 1615 08/06/23 2011  GLUCAP 93 90 89 128*  123*   Cardiac Enzymes: No results for input(s): "CKTOTAL", "CKMB", "CKMBINDEX", "TROPONINI" in the last 168 hours. No results for input(s): "PROBNP" in the last 8760 hours. Coagulation Profile: No results for input(s): "INR", "PROTIME" in the last 168 hours. Thyroid Function Tests: No results for input(s): "TSH", "T4TOTAL", "FREET4", "T3FREE", "THYROIDAB" in the last 72 hours. Lipid Profile: No results for input(s): "CHOL", "HDL", "LDLCALC", "TRIG", "CHOLHDL", "LDLDIRECT" in the last 72 hours. Anemia Panel: No results for input(s): "VITAMINB12", "FOLATE", "FERRITIN", "TIBC", "IRON", "RETICCTPCT" in the last 72 hours. Urine analysis:    Component Value Date/Time   COLORURINE AMBER (A) 05/26/2023 2026   APPEARANCEUR CLEAR 05/26/2023 2026   APPEARANCEUR Clear 01/23/2021 1634   LABSPEC 1.020 05/26/2023 2026   PHURINE 5.0 05/26/2023 2026   GLUCOSEU NEGATIVE 05/26/2023 2026   GLUCOSEU NEGATIVE 10/01/2020 0853   HGBUR NEGATIVE 05/26/2023 2026   BILIRUBINUR NEGATIVE 05/26/2023 2026   BILIRUBINUR Negative 01/23/2021 1634   KETONESUR 5 (A)  05/26/2023 2026   PROTEINUR 100 (A) 05/26/2023 2026   UROBILINOGEN 0.2 10/01/2020 0853   NITRITE NEGATIVE 05/26/2023 2026   LEUKOCYTESUR NEGATIVE 05/26/2023 2026   Sepsis Labs: Invalid input(s): "PROCALCITONIN", "LACTICIDVEN"  Microbiology: No results found for this or any previous visit (from the past 240 hour(s)).  Radiology Studies: No results found.    Navayah Sok T. Emmakate Hypes [facility]  If 7PM-7AM, please contact night-coverage [email or web address] 08/08/2023, 12:09 PM

## 2023-08-09 LAB — GLUCOSE, CAPILLARY: Glucose-Capillary: 90 mg/dL (ref 70–99)

## 2023-08-09 NOTE — Plan of Care (Signed)
  Problem: Pain Managment: Goal: General experience of comfort will improve Outcome: Progressing   Problem: Safety: Goal: Ability to remain free from injury will improve Outcome: Progressing   Problem: Skin Integrity: Goal: Risk for impaired skin integrity will decrease Outcome: Progressing   Problem: Education: Goal: Knowledge of General Education information will improve Description: Including pain rating scale, medication(s)/side effects and non-pharmacologic comfort measures Outcome: Progressing   

## 2023-08-09 NOTE — Progress Notes (Signed)
Mobility Specialist: Progress Note   08/09/23 1554  Mobility  Activity Ambulated with assistance in hallway  Level of Assistance Standby assist, set-up cues, supervision of patient - no hands on  Assistive Device None  Distance Ambulated (ft) 550 ft  LLE Weight Bearing WBAT (with post op shoe)  Activity Response Tolerated well  Mobility Referral Yes  $Mobility charge 1 Mobility  Mobility Specialist Start Time (ACUTE ONLY) 1358  Mobility Specialist Stop Time (ACUTE ONLY) 1407  Mobility Specialist Time Calculation (min) (ACUTE ONLY) 9 min    Pt was agreeable to mobility session - received in bed. Required verbal and tactile cues to correct posture and slow steps. CG at some points d/t unsteadiness and confusion on directions. Returned to room without fault. Left in bed with all needs met, call bell in reach. Bed alarm on.   [name] Mobility Specialist Please contact via SecureChat or Rehab office at [phone]

## 2023-08-09 NOTE — Progress Notes (Signed)
PROGRESS NOTE  [name] ZDG:[id number] DOB: September 24, 1966   PCP: Marcine Matar, MD  Patient is from: Home.  Lives with friend.  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 He underwent left TMA with wound VAC by Dr. Lajoyce Corners on 05/05/2023. Therapy recommended home health but patient has no safe disposition. Per sister's report to [facility], "he was staying with some people because he couldn't stay with sister anymore due to violent behaviors. Sister was bringing food and they were letting him stay for free. And now those people are saying he can't come back and sister can't take him either". TOC filed APS report. Now a placement issue. Wound VAC has been removed, suture removed 9/09.  Patient stable for discharge.     Subjective: Seen and examined earlier this morning.  No major events overnight of this morning.  No complaints.  Objective: Vitals:   08/08/23 0902 08/08/23 1501 08/08/23 2230 08/09/23 0750  BP: 118/77 116/69 122/87 103/63  Pulse: 73 75 86 76  Resp:  18 16 18   Temp: 98 F (36.7 C) 98.6 F (37 C) 98.4 F (36.9 C) 98.2 F (36.8 C)  TempSrc:   Oral   SpO2: 98% 96% 98% 96%  Weight:        Examination:  GENERAL: No apparent distress.  Nontoxic. HEENT: MMM.  Vision and hearing grossly intact.  NECK: Supple.  No apparent JVD.  RESP:  No IWOB.  Fair aeration bilaterally. CVS:  RRR. Heart sounds normal.  ABD/GI/GU: BS+. Abd soft, NTND.  MSK/EXT: S/p left TMA.  Surgical wound healed completely. NEURO: Awake and alert. Oriented fairly.  No apparent focal neuro deficit. PSYCH: Calm. Normal affect.    Assessment and  plan: Left foot cellulitis with gangrene and osteomyelitis:Vascular surgery consulted, suggested no indication for vascular intervention. S/p left TMA and wound VAC by Dr. Lajoyce Corners.  Wound healed.  WBAT. Stable for discharge but difficult placement.  History of TBI/cognitive impairment and intermittent aggressive behaviors/depression: Psychiatry consulted appreciate recommendations,  unfortunately patient does not currently have capacity. -Continue with Risperdal, Prozac and hydroxyzine.  Controlled DM-2: A1c 5.8% on 7/25.  CBG has been fairly controlled. -Discontinued insulin and CBG monitoring -Started low-dose metformin on 10/5.  Unsafe living situation: -TOC and APS involved.   Anemia of chronic disease: Stable -Monitor intermittently    History of seizures: -Continue with Keppra 500 mg twice daily.   Essential hypertension: Normotensive for most part.  Not on meds.   Hyponatremia: Resolved   GERD:  -On PPI.   Hyperlipidemia -Continue statin   Increased nutrient needs Body mass index is 22.02 kg/m. Nutrition Problem: Increased nutrient needs Etiology: acute illness (L foot gangrene with osteomyelitis; wound vac) Signs/Symptoms: estimated needs Interventions: Refer to RD note for recommendations, Ensure Enlive (each supplement provides 350kcal and 20 grams of protein), Juven, MVI   DVT prophylaxis:  SCD's Start: 06/02/23 1515 enoxaparin (LOVENOX) injection 40 mg Start: 05/27/23 1000  Code Status: Full code Family Communication: None at bedside Level of care: Med-Surg Status is: Inpatient Remains inpatient appropriate because: Lack of safe disposition   Final disposition: SNF? Consultants:  Orthopedic surgery Psychiatry  25 minutes with more than 50% spent  in reviewing records, counseling patient/family and coordinating care.   Sch Meds:  Scheduled Meds:  vitamin C  1,000 mg Oral Daily   atorvastatin  10 mg Oral Daily   enoxaparin (LOVENOX) injection  40 mg  Subcutaneous Daily   feeding supplement (GLUCERNA SHAKE)  237 mL Oral BID BM   FLUoxetine  30 mg Oral Daily   levETIRAcetam  500 mg Oral BID   metFORMIN  500 mg Oral BID WC   multivitamins with iron  1 tablet Oral Daily   nutrition supplement (JUVEN)  1 packet Oral BID BM   pantoprazole  40 mg Oral Daily   risperiDONE  0.25 mg Oral QHS   Continuous Infusions: PRN Meds:.acetaminophen **OR** acetaminophen, alum & mag hydroxide-simeth, bisacodyl, loperamide, ondansetron **OR** ondansetron (ZOFRAN) IV, polyethylene glycol  Antimicrobials: Anti-infectives (From admission, onward)    Start     Dose/Rate Route Frequency Ordered Stop   06/03/23 0600  ceFAZolin (ANCEF) IVPB 2g/100 mL premix        2 g 200 mL/hr over 30 Minutes Intravenous On call to O.R. 06/02/23 1149 06/02/23 1330   06/02/23 2200  ceFAZolin (ANCEF) IVPB 2g/100 mL premix        2 g 200 mL/hr over 30 Minutes Intravenous Every 8 hours 06/02/23 1514 06/03/23 0639   06/02/23 1142  ceFAZolin (ANCEF) 2-4 GM/100ML-% IVPB       Note to Pharmacy: [facility], [name]: cabinet override      06/02/23 1142 06/02/23 1330   06/01/23 1715  metroNIDAZOLE (FLAGYL) IVPB 500 mg  Status:  Discontinued        500 mg 100 mL/hr over 60 Minutes Intravenous Every 12 hours 06/01/23 1615 06/02/23 1515   05/30/23 0800  ceFAZolin (ANCEF) IVPB 2g/100 mL premix        2 g 200 mL/hr over 30 Minutes Intravenous On call to O.R. 05/30/23 0412 05/30/23 1209   05/28/23 1500  vancomycin (VANCOREADY) IVPB 1250 mg/250 mL  Status:  Discontinued        1,250 mg 166.7 mL/hr over 90 Minutes Intravenous Every 24 hours 05/28/23 1211 06/02/23 1515   05/28/23 1145  vancomycin (VANCOREADY) IVPB 1250 mg/250 mL  Status:  Discontinued        1,250 mg 166.7 mL/hr over 90 Minutes Intravenous Every 24 hours 05/28/23 1051 05/28/23 1211   05/26/23 2030  cefTRIAXone (ROCEPHIN) 2 g in sodium chloride 0.9 % 100 mL IVPB        2 g 200 mL/hr over 30 Minutes Intravenous Every 24 hours  05/26/23 2026 06/01/23 1845        I have personally reviewed the following labs and images: CBC: Recent Labs  Lab 08/04/23 0611  WBC 5.2  HGB 11.5*  HCT 33.6*  MCV 87.0  PLT 198   BMP &GFR Recent Labs  Lab 08/04/23 0611  NA 137  K 4.2  CL 105  CO2 26  GLUCOSE 109*  BUN 33*  CREATININE 1.01  CALCIUM 9.0  MG 2.1  PHOS 5.0*   Estimated Creatinine Clearance: 62.3 mL/min (by C-G formula based on SCr of 1.01 mg/dL). Liver & Pancreas: No results for input(s): "AST", "ALT", "ALKPHOS", "BILITOT", "PROT", "ALBUMIN" in the last 168 hours. No results for input(s): "LIPASE", "AMYLASE" in the last 168 hours. No results for input(s): "AMMONIA" in the last 168 hours. Diabetic: No results for input(s): "HGBA1C" in the last 72 hours. Recent Labs  Lab 08/06/23 0845 08/06/23 1102 08/06/23 1615 08/06/23 2011 08/09/23 0717  GLUCAP 90  89 128* 123* 90   Cardiac Enzymes: No results for input(s): "CKTOTAL", "CKMB", "CKMBINDEX", "TROPONINI" in the last 168 hours. No results for input(s): "PROBNP" in the last 8760 hours. Coagulation Profile: No results for input(s): "INR", "PROTIME" in the last 168 hours. Thyroid Function Tests: No results for input(s): "TSH", "T4TOTAL", "FREET4", "T3FREE", "THYROIDAB" in the last 72 hours. Lipid Profile: No results for input(s): "CHOL", "HDL", "LDLCALC", "TRIG", "CHOLHDL", "LDLDIRECT" in the last 72 hours. Anemia Panel: No results for input(s): "VITAMINB12", "FOLATE", "FERRITIN", "TIBC", "IRON", "RETICCTPCT" in the last 72 hours. Urine analysis:    Component Value Date/Time   COLORURINE AMBER (A) 05/26/2023 2026   APPEARANCEUR CLEAR 05/26/2023 2026   APPEARANCEUR Clear 01/23/2021 1634   LABSPEC 1.020 05/26/2023 2026   PHURINE 5.0 05/26/2023 2026   GLUCOSEU NEGATIVE 05/26/2023 2026   GLUCOSEU NEGATIVE 10/01/2020 0853   HGBUR NEGATIVE 05/26/2023 2026   BILIRUBINUR NEGATIVE 05/26/2023 2026   BILIRUBINUR Negative 01/23/2021 1634   KETONESUR  5 (A) 05/26/2023 2026   PROTEINUR 100 (A) 05/26/2023 2026   UROBILINOGEN 0.2 10/01/2020 0853   NITRITE NEGATIVE 05/26/2023 2026   LEUKOCYTESUR NEGATIVE 05/26/2023 2026   Sepsis Labs: Invalid input(s): "PROCALCITONIN", "LACTICIDVEN"  Microbiology: No results found for this or any previous visit (from the past 240 hour(s)).  Radiology Studies: No results found.    Sequoia Witz T. Jammy Plotkin [facility]  If 7PM-7AM, please contact night-coverage [email or web address] 08/09/2023, 12:55 PM

## 2023-08-09 NOTE — Plan of Care (Signed)
Problem: Education: Goal: Ability to describe self-care measures that may prevent or decrease complications (Diabetes Survival Skills Education) will improve Outcome: Not Progressing Goal: Individualized Educational Video(s) Outcome: Not Progressing   Problem: Coping: Goal: Ability to adjust to condition or change in health will improve Outcome: Not Progressing   Problem: Health Behavior/Discharge Planning: Goal: Ability to identify and utilize available resources and services will improve Outcome: Not Progressing Goal: Ability to manage health-related needs will improve Outcome: Not Progressing   Problem: Metabolic: Goal: Ability to maintain appropriate glucose levels will improve Outcome: Not Progressing   Problem: Nutritional: Goal: Progress toward achieving an optimal weight will improve Outcome: Not Progressing   Problem: Skin Integrity: Goal: Risk for impaired skin integrity will decrease Outcome: Not Progressing   Problem: Tissue Perfusion: Goal: Adequacy of tissue perfusion will improve Outcome: Not Progressing   Problem: Education: Goal: Knowledge of General Education information will improve Description: Including pain rating scale, medication(s)/side effects and non-pharmacologic comfort measures Outcome: Not Progressing   Problem: Health Behavior/Discharge Planning: Goal: Ability to manage health-related needs will improve Outcome: Not Progressing   Problem: Clinical Measurements: Goal: Ability to maintain clinical measurements within normal limits will improve Outcome: Not Progressing Goal: Will remain free from infection Outcome: Not Progressing Goal: Diagnostic test results will improve Outcome: Not Progressing Goal: Respiratory complications will improve Outcome: Not Progressing Goal: Cardiovascular complication will be avoided Outcome: Not Progressing   Problem: Activity: Goal: Risk for activity intolerance will decrease Outcome: Not  Progressing   Problem: Nutrition: Goal: Adequate nutrition will be maintained Outcome: Not Progressing   Problem: Coping: Goal: Level of anxiety will decrease Outcome: Not Progressing   Problem: Elimination: Goal: Will not experience complications related to bowel motility Outcome: Not Progressing Goal: Will not experience complications related to urinary retention Outcome: Not Progressing   Problem: Pain Managment: Goal: General experience of comfort will improve Outcome: Not Progressing   Problem: Safety: Goal: Ability to remain free from injury will improve Outcome: Not Progressing   Problem: Skin Integrity: Goal: Risk for impaired skin integrity will decrease Outcome: Not Progressing   Problem: Education: Goal: Knowledge of General Education information will improve Description: Including pain rating scale, medication(s)/side effects and non-pharmacologic comfort measures Outcome: Not Progressing   Problem: Health Behavior/Discharge Planning: Goal: Ability to manage health-related needs will improve Outcome: Not Progressing   Problem: Clinical Measurements: Goal: Ability to maintain clinical measurements within normal limits will improve Outcome: Not Progressing Goal: Will remain free from infection Outcome: Not Progressing Goal: Diagnostic test results will improve Outcome: Not Progressing Goal: Respiratory complications will improve Outcome: Not Progressing Goal: Cardiovascular complication will be avoided Outcome: Not Progressing   Problem: Activity: Goal: Risk for activity intolerance will decrease Outcome: Not Progressing   Problem: Nutrition: Goal: Adequate nutrition will be maintained Outcome: Not Progressing   Problem: Coping: Goal: Level of anxiety will decrease Outcome: Not Progressing   Problem: Elimination: Goal: Will not experience complications related to bowel motility Outcome: Not Progressing Goal: Will not experience complications  related to urinary retention Outcome: Not Progressing   Problem: Pain Managment: Goal: General experience of comfort will improve Outcome: Not Progressing   Problem: Safety: Goal: Ability to remain free from injury will improve Outcome: Not Progressing   Problem: Skin Integrity: Goal: Risk for impaired skin integrity will decrease Outcome: Not Progressing   Problem: Education: Goal: Knowledge of the prescribed therapeutic regimen will improve Outcome: Not Progressing Goal: Ability to verbalize activity precautions or restrictions will improve Outcome: Not Progressing Goal:  Understanding of discharge needs will improve Outcome: Not Progressing   Problem: Activity: Goal: Ability to perform//tolerate increased activity and mobilize with assistive devices will improve Outcome: Not Progressing

## 2023-08-10 NOTE — Plan of Care (Signed)

## 2023-08-10 NOTE — Plan of Care (Signed)
Problem: Education: Goal: Ability to describe self-care measures that may prevent or decrease complications (Diabetes Survival Skills Education) will improve Outcome: Not Progressing Goal: Individualized Educational Video(s) Outcome: Not Progressing   Problem: Coping: Goal: Ability to adjust to condition or change in health will improve Outcome: Not Progressing   Problem: Health Behavior/Discharge Planning: Goal: Ability to identify and utilize available resources and services will improve Outcome: Not Progressing Goal: Ability to manage health-related needs will improve Outcome: Not Progressing   Problem: Metabolic: Goal: Ability to maintain appropriate glucose levels will improve Outcome: Not Progressing   Problem: Nutritional: Goal: Progress toward achieving an optimal weight will improve Outcome: Not Progressing   Problem: Skin Integrity: Goal: Risk for impaired skin integrity will decrease Outcome: Not Progressing   Problem: Tissue Perfusion: Goal: Adequacy of tissue perfusion will improve Outcome: Not Progressing   Problem: Education: Goal: Knowledge of General Education information will improve Description: Including pain rating scale, medication(s)/side effects and non-pharmacologic comfort measures Outcome: Not Progressing   Problem: Health Behavior/Discharge Planning: Goal: Ability to manage health-related needs will improve Outcome: Not Progressing   Problem: Clinical Measurements: Goal: Ability to maintain clinical measurements within normal limits will improve Outcome: Not Progressing Goal: Will remain free from infection Outcome: Not Progressing Goal: Diagnostic test results will improve Outcome: Not Progressing Goal: Respiratory complications will improve Outcome: Not Progressing Goal: Cardiovascular complication will be avoided Outcome: Not Progressing   Problem: Activity: Goal: Risk for activity intolerance will decrease Outcome: Not  Progressing   Problem: Nutrition: Goal: Adequate nutrition will be maintained Outcome: Not Progressing   Problem: Coping: Goal: Level of anxiety will decrease Outcome: Not Progressing   Problem: Elimination: Goal: Will not experience complications related to bowel motility Outcome: Not Progressing Goal: Will not experience complications related to urinary retention Outcome: Not Progressing   Problem: Pain Managment: Goal: General experience of comfort will improve Outcome: Not Progressing   Problem: Safety: Goal: Ability to remain free from injury will improve Outcome: Not Progressing   Problem: Skin Integrity: Goal: Risk for impaired skin integrity will decrease Outcome: Not Progressing   Problem: Education: Goal: Knowledge of General Education information will improve Description: Including pain rating scale, medication(s)/side effects and non-pharmacologic comfort measures Outcome: Not Progressing   Problem: Health Behavior/Discharge Planning: Goal: Ability to manage health-related needs will improve Outcome: Not Progressing   Problem: Clinical Measurements: Goal: Ability to maintain clinical measurements within normal limits will improve Outcome: Not Progressing Goal: Will remain free from infection Outcome: Not Progressing Goal: Diagnostic test results will improve Outcome: Not Progressing Goal: Respiratory complications will improve Outcome: Not Progressing Goal: Cardiovascular complication will be avoided Outcome: Not Progressing   Problem: Activity: Goal: Risk for activity intolerance will decrease Outcome: Not Progressing   Problem: Nutrition: Goal: Adequate nutrition will be maintained Outcome: Not Progressing   Problem: Coping: Goal: Level of anxiety will decrease Outcome: Not Progressing   Problem: Elimination: Goal: Will not experience complications related to bowel motility Outcome: Not Progressing Goal: Will not experience complications  related to urinary retention Outcome: Not Progressing   Problem: Pain Managment: Goal: General experience of comfort will improve Outcome: Not Progressing   Problem: Safety: Goal: Ability to remain free from injury will improve Outcome: Not Progressing   Problem: Skin Integrity: Goal: Risk for impaired skin integrity will decrease Outcome: Not Progressing   Problem: Education: Goal: Knowledge of the prescribed therapeutic regimen will improve Outcome: Not Progressing Goal: Ability to verbalize activity precautions or restrictions will improve Outcome: Not Progressing Goal:  Understanding of discharge needs will improve Outcome: Not Progressing   Problem: Activity: Goal: Ability to perform//tolerate increased activity and mobilize with assistive devices will improve Outcome: Not Progressing

## 2023-08-10 NOTE — Progress Notes (Signed)
Mobility Specialist: Progress Note   08/10/23 1608  Mobility  Activity Ambulated with assistance in hallway  Level of Assistance Standby assist, set-up cues, supervision of patient - no hands on  Assistive Device None  Distance Ambulated (ft) 550 ft  LLE Weight Bearing WBAT  Activity Response Tolerated well  Mobility Referral Yes  $Mobility charge 1 Mobility  Mobility Specialist Start Time (ACUTE ONLY) 1357  Mobility Specialist Stop Time (ACUTE ONLY) 1406  Mobility Specialist Time Calculation (min) (ACUTE ONLY) 9 min    Pt was agreeable to mobility session - received in bed. Sv mostly, but CG at times d/t unsteadiness. Required verbal and tactile cues to slow steps and correct posture. Returned to room without fault. Left in bed with all needs met, call bell in reach. Bed alarm on.   [name] Mobility Specialist Please contact via SecureChat or Rehab office at [phone]
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
## 2023-08-11 NOTE — Progress Notes (Signed)
Physical Therapy Treatment Patient Details Name: [name] MRN: [id number] DOB: 1966/02/19 Today's Date: 08/11/2023   History of Present Illness Pt is a 57 year old male admitted for evaluation of being unable to get up and ambulate, and not eating for 2 days, admitted for left foot gangrene with osteomyelitis. 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    PT Comments  Continue to focus on higher level balance using scavenger hunt for blocks. Also working on short term memory, carryover of safety techniques, dual tasking and wayfinding. Although pt making slowed progress he continues to struggle with these tasks in a way that is unsafe for being unsupervised. D/c plans continue to be appropriate. PT will continue to follow acutely.     If plan is discharge home, recommend the following: A little help with walking and/or transfers;A little help with bathing/dressing/bathroom;Assistance with cooking/housework;Direct supervision/assist for medications management;Assist for transportation;Help with stairs or ramp for entrance   Can travel by private vehicle     No  Equipment Recommendations   (will continue to discern)    Recommendations for Other Services       Precautions / Restrictions Precautions Precautions: Fall;Other (comment) Precaution Comments: History of TBI with left side weakness and cognitive impairments Required Braces or Orthoses: Other Brace Other Brace: L post op shoe Restrictions Weight Bearing Restrictions: No LLE Weight Bearing: Weight bearing as tolerated Other Position/Activity Restrictions: Upgraded to WBAT with postop shoe.     Mobility  Bed Mobility Overal bed mobility: Needs Assistance Bed Mobility: Supine to Sit     Supine to sit: Modified independent (Device/Increase time) Sit to supine: Modified independent (Device/Increase time)   General bed  mobility comments: increased time and effort to come to EoB    Transfers Overall transfer level: Needs assistance Equipment used: Rolling walker (2 wheels) Transfers: Sit to/from Stand Sit to Stand: Contact guard assist           General transfer comment: CGA for safety for power up and steady, continues to have very wide BoS    Ambulation/Gait Ambulation/Gait assistance: Contact guard assist Gait Distance (Feet): 1200 Feet Assistive device: None Gait Pattern/deviations: Shuffle, Staggering left, Staggering right, Wide base of support Gait velocity: sometimes to fast for conditions Gait velocity interpretation: <1.8 ft/sec, indicate of risk for recurrent falls   General Gait Details: very stiff almost waddling gait today, increased use of UE to walk on furniture when performing picking up block task          Balance Overall balance assessment: Needs assistance Sitting-balance support: No upper extremity supported, Feet supported Sitting balance-Leahy Scale: Normal     Standing balance support: Single extremity supported, During functional activity Standing balance-Leahy Scale: Fair Standing balance comment: looks for support but is able to stabilize with both static and dynamic balance at a contact guard level               High Level Balance Comments: continue to work on bending down to pick up items            Cognition Arousal: Alert Behavior During Therapy: Impulsive Overall Cognitive Status: History of cognitive impairments - at baseline                                 General Comments: hx of cognitive impairment, impulsive, requires cues for safety and to ask  for assistance. needs repeated cues to maintain corrections        Exercises Other Exercises Other Exercises: bending down to pick up blocks Other Exercises: remembering colors, numbers and figuring out absent blocks    General Comments General comments (skin integrity,  edema, etc.): In house interpreter Erie Noe utilized during session. Focus on carryover from prior sessions, dual tasking, bending to pick up items, getting close to targeted items before picking it up      Pertinent Vitals/Pain Pain Assessment Pain Assessment: No/denies pain     PT Goals (current goals can now be found in the care plan section) Acute Rehab PT Goals PT Goal Formulation: With patient Time For Goal Achievement: 08/03/23 Potential to Achieve Goals: Good Progress towards PT goals: Progressing toward goals    Frequency    Min 1X/week       AM-PAC PT "6 Clicks" Mobility   Outcome Measure  Help needed turning from your back to your side while in a flat bed without using bedrails?: None Help needed moving from lying on your back to sitting on the side of a flat bed without using bedrails?: None Help needed moving to and from a bed to a chair (including a wheelchair)?: A Little Help needed standing up from a chair using your arms (e.g., wheelchair or bedside chair)?: A Little Help needed to walk in hospital room?: A Little Help needed climbing 3-5 steps with a railing? : A Little 6 Click Score: 20    End of Session Equipment Utilized During Treatment: Gait belt Activity Tolerance: Patient tolerated treatment well Patient left: with call bell/phone within reach;in chair;with chair alarm set;with family/visitor present Nurse Communication: Mobility status PT Visit Diagnosis: Other abnormalities of gait and mobility (R26.89)     Time: 9147-8295 PT Time Calculation (min) (ACUTE ONLY): 29 min  Charges:    $Gait Training: 8-22 mins $Therapeutic Activity: 8-22 mins PT General Charges $$ ACUTE PT VISIT: 1 Visit                     Lealer Marsland B. Beverely Risen PT, DPT Acute Rehabilitation Services Please use secure chat or  Call Office [phone]    Elon Alas [facility] 08/11/2023, 3:19 PM

## 2023-08-11 NOTE — Progress Notes (Signed)
Mobility Specialist: Progress Note   08/11/23 1519  Mobility  Activity Ambulated with assistance in hallway  Level of Assistance Standby assist, set-up cues, supervision of patient - no hands on  Assistive Device None  Distance Ambulated (ft) 550 ft  LLE Weight Bearing WBAT  Activity Response Tolerated well  Mobility Referral Yes  $Mobility charge 1 Mobility  Mobility Specialist Start Time (ACUTE ONLY) 0900  Mobility Specialist Stop Time (ACUTE ONLY) 0906  Mobility Specialist Time Calculation (min) (ACUTE ONLY) 6 min    Pt was agreeable to mobility session - received in bed. SV throughout with less unsteadiness than previous session. Required verbal and tactile cues to slow steps and correct posture. Returned to room without fault. Left in bed with all needs met, call bell in reach. Bed alarm on.   [name] Mobility Specialist Please contact via SecureChat or Rehab office at [phone]

## 2023-08-11 NOTE — Plan of Care (Signed)
Problem: Education: Goal: Ability to describe self-care measures that may prevent or decrease complications (Diabetes Survival Skills Education) will improve Outcome: Not Progressing Goal: Individualized Educational Video(s) Outcome: Not Progressing   Problem: Coping: Goal: Ability to adjust to condition or change in health will improve Outcome: Not Progressing   Problem: Health Behavior/Discharge Planning: Goal: Ability to identify and utilize available resources and services will improve Outcome: Not Progressing Goal: Ability to manage health-related needs will improve Outcome: Not Progressing   Problem: Metabolic: Goal: Ability to maintain appropriate glucose levels will improve Outcome: Not Progressing   Problem: Nutritional: Goal: Progress toward achieving an optimal weight will improve Outcome: Not Progressing   Problem: Skin Integrity: Goal: Risk for impaired skin integrity will decrease Outcome: Not Progressing   Problem: Tissue Perfusion: Goal: Adequacy of tissue perfusion will improve Outcome: Not Progressing   Problem: Education: Goal: Knowledge of General Education information will improve Description: Including pain rating scale, medication(s)/side effects and non-pharmacologic comfort measures Outcome: Not Progressing   Problem: Health Behavior/Discharge Planning: Goal: Ability to manage health-related needs will improve Outcome: Not Progressing   Problem: Clinical Measurements: Goal: Ability to maintain clinical measurements within normal limits will improve Outcome: Not Progressing Goal: Will remain free from infection Outcome: Not Progressing Goal: Diagnostic test results will improve Outcome: Not Progressing Goal: Respiratory complications will improve Outcome: Not Progressing Goal: Cardiovascular complication will be avoided Outcome: Not Progressing   Problem: Activity: Goal: Risk for activity intolerance will decrease Outcome: Not  Progressing   Problem: Nutrition: Goal: Adequate nutrition will be maintained Outcome: Not Progressing   Problem: Coping: Goal: Level of anxiety will decrease Outcome: Not Progressing   Problem: Elimination: Goal: Will not experience complications related to bowel motility Outcome: Not Progressing Goal: Will not experience complications related to urinary retention Outcome: Not Progressing   Problem: Pain Managment: Goal: General experience of comfort will improve Outcome: Not Progressing   Problem: Safety: Goal: Ability to remain free from injury will improve Outcome: Not Progressing   Problem: Skin Integrity: Goal: Risk for impaired skin integrity will decrease Outcome: Not Progressing   Problem: Education: Goal: Knowledge of General Education information will improve Description: Including pain rating scale, medication(s)/side effects and non-pharmacologic comfort measures Outcome: Not Progressing   Problem: Health Behavior/Discharge Planning: Goal: Ability to manage health-related needs will improve Outcome: Not Progressing   Problem: Clinical Measurements: Goal: Ability to maintain clinical measurements within normal limits will improve Outcome: Not Progressing Goal: Will remain free from infection Outcome: Not Progressing Goal: Diagnostic test results will improve Outcome: Not Progressing Goal: Respiratory complications will improve Outcome: Not Progressing Goal: Cardiovascular complication will be avoided Outcome: Not Progressing   Problem: Activity: Goal: Risk for activity intolerance will decrease Outcome: Not Progressing   Problem: Nutrition: Goal: Adequate nutrition will be maintained Outcome: Not Progressing   Problem: Coping: Goal: Level of anxiety will decrease Outcome: Not Progressing   Problem: Elimination: Goal: Will not experience complications related to bowel motility Outcome: Not Progressing Goal: Will not experience complications  related to urinary retention Outcome: Not Progressing   Problem: Pain Managment: Goal: General experience of comfort will improve Outcome: Not Progressing   Problem: Safety: Goal: Ability to remain free from injury will improve Outcome: Not Progressing   Problem: Skin Integrity: Goal: Risk for impaired skin integrity will decrease Outcome: Not Progressing   Problem: Education: Goal: Knowledge of the prescribed therapeutic regimen will improve Outcome: Not Progressing Goal: Ability to verbalize activity precautions or restrictions will improve Outcome: Not Progressing Goal:  Understanding of discharge needs will improve Outcome: Not Progressing   Problem: Activity: Goal: Ability to perform//tolerate increased activity and mobilize with assistive devices will improve Outcome: Not Progressing

## 2023-08-12 LAB — COMPREHENSIVE METABOLIC PANEL
ALT: 62 U/L — ABNORMAL HIGH (ref 0–44)
AST: 26 U/L (ref 15–41)
Albumin: 3.2 g/dL — ABNORMAL LOW (ref 3.5–5.0)
Alkaline Phosphatase: 49 U/L (ref 38–126)
Anion gap: 9 (ref 5–15)
BUN: 36 mg/dL — ABNORMAL HIGH (ref 6–20)
CO2: 27 mmol/L (ref 22–32)
Calcium: 9.1 mg/dL (ref 8.9–10.3)
Chloride: 102 mmol/L (ref 98–111)
Creatinine, Ser: 1.14 mg/dL (ref 0.61–1.24)
GFR, Estimated: >60 mL/min (ref >60)
Glucose, Bld: 112 mg/dL — ABNORMAL HIGH (ref 70–99)
Potassium: 4.4 mmol/L (ref 3.5–5.1)
Sodium: 138 mmol/L (ref 135–145)
Total Bilirubin: 0.6 mg/dL (ref 0.3–1.2)
Total Protein: 6.4 g/dL — ABNORMAL LOW (ref 6.5–8.1)

## 2023-08-12 LAB — CBC
HCT: 33.7 % — ABNORMAL LOW (ref 39.0–52.0)
Hemoglobin: 11.4 g/dL — ABNORMAL LOW (ref 13.0–17.0)
MCH: 28.7 pg (ref 26.0–34.0)
MCHC: 33.8 g/dL (ref 30.0–36.0)
MCV: 84.9 fL (ref 80.0–100.0)
Platelets: 208 10*3/uL (ref 150–400)
RBC: 3.97 MIL/uL — ABNORMAL LOW (ref 4.22–5.81)
RDW: 12.3 % (ref 11.5–15.5)
WBC: 4.7 10*3/uL (ref 4.0–10.5)
nRBC: 0 % (ref 0.0–0.2)

## 2023-08-12 LAB — MAGNESIUM: Magnesium: 1.7 mg/dL (ref 1.7–2.4)

## 2023-08-12 MED ORDER — SODIUM CHLORIDE 0.9% FLUSH: 3.0000 mL | Freq: Two times a day (BID) | INTRAVENOUS | Status: DC

## 2023-08-12 NOTE — Plan of Care (Signed)
  Problem: Education: Goal: Ability to describe self-care measures that may prevent or decrease complications (Diabetes Survival Skills Education) will improve Outcome: Progressing   Problem: Coping: Goal: Ability to adjust to condition or change in health will improve Outcome: Progressing   Problem: Health Behavior/Discharge Planning: Goal: Ability to identify and utilize available resources and services will improve Outcome: Progressing   Problem: Metabolic: Goal: Ability to maintain appropriate glucose levels will improve Outcome: Progressing   Problem: Nutritional: Goal: Progress toward achieving an optimal weight will improve Outcome: Progressing   Problem: Skin Integrity: Goal: Risk for impaired skin integrity will decrease Outcome: Progressing
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
## 2023-08-12 NOTE — Progress Notes (Signed)
Mobility Specialist: Progress Note   08/12/23 1623  Mobility  Activity Ambulated with assistance in hallway  Level of Assistance Standby assist, set-up cues, supervision of patient - no hands on  Assistive Device None  Distance Ambulated (ft) 550 ft  LLE Weight Bearing WBAT  Activity Response Tolerated well  Mobility Referral Yes  $Mobility charge 1 Mobility  Mobility Specialist Start Time (ACUTE ONLY) 1057  Mobility Specialist Stop Time (ACUTE ONLY) 1108  Mobility Specialist Time Calculation (min) (ACUTE ONLY) 11 min    Pt was agreeable to mobility session - received in bed. SV mostly but required CG for unsteadiness at times.  Required verbal and tactile cues to slow steps and correct posture. Returned to room without fault. Left in bed with all needs met, call bell in reach. Bed alarm on.   [name] Mobility Specialist Please contact via SecureChat or Rehab office at [phone]

## 2023-08-13 LAB — GLUCOSE, CAPILLARY
Glucose-Capillary: 92 mg/dL (ref 70–99)
Glucose-Capillary: 104 mg/dL — ABNORMAL HIGH (ref 70–99)

## 2023-08-13 MED ORDER — ORAL CARE MOUTH RINSE: 15.0000 mL | OROMUCOSAL | Status: DC | PRN
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
## 2023-08-13 NOTE — Progress Notes (Signed)
Physical Therapy Treatment Patient Details Name: [name] MRN: [id number] DOB: 02-May-1966 Today's Date: 08/13/2023   History of Present Illness Pt is a 57 year old male admitted for evaluation of being unable to get up and ambulate, and not eating for 2 days, admitted for left foot gangrene with osteomyelitis. 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    PT Comments  The pt was agreeable to session, continues to make slow but steady progress with mobility this session. Focus on functional balance tasks and scavenger hunt to practice mobilizing while completing multi-step instructions and working on STM. The pt continues to have difficulty with balance when challenged (especially with turning, dynamic SLS, and bending over to pick up objects), and needs cues for visual scanning and safety. Will continue to benefit from skilled PT acutely while safe and appropriate d/c is found.    If plan is discharge home, recommend the following: A little help with walking and/or transfers;A little help with bathing/dressing/bathroom;Assistance with cooking/housework;Direct supervision/assist for medications management;Assist for transportation;Help with stairs or ramp for entrance   Can travel by private vehicle     No  Equipment Recommendations   (will continue to discern)    Recommendations for Other Services       Precautions / Restrictions Precautions Precautions: Fall;Other (comment) Precaution Comments: History of TBI with left side weakness and cognitive impairments Required Braces or Orthoses: Other Brace Other Brace: L post op shoe Restrictions Weight Bearing Restrictions: No LLE Weight Bearing: Weight bearing as tolerated Other Position/Activity Restrictions: Upgraded to WBAT with postop shoe.     Mobility  Bed Mobility Overal bed mobility: Needs Assistance, Modified Independent Bed  Mobility: Supine to Sit     Supine to sit: Modified independent (Device/Increase time) Sit to supine: Modified independent (Device/Increase time)   General bed mobility comments: increased time and effort to come to EoB    Transfers Overall transfer level: Needs assistance Equipment used: None Transfers: Sit to/from Stand Sit to Stand: Contact guard assist           General transfer comment: CGA for safety for power up and steady, continues to have very wide BoS    Ambulation/Gait Ambulation/Gait assistance: Contact guard assist Gait Distance (Feet): 600 Feet Assistive device: None Gait Pattern/deviations: Shuffle, Staggering left, Staggering right, Wide base of support Gait velocity: sometimes to fast for conditions     General Gait Details: very stiff almost waddling gait today, no UE support or overt LOB with walking. slowed speed and increased sway with addition of looking for numbers/letters on sticky notes   Stairs Stairs: Yes Stairs assistance: Contact guard assist Stair Management: Two rails, Alternating pattern, Forwards Number of Stairs: 5 General stair comments: CGA with BUE support, pt encouraged to attempt with less UE support and then needed more assistance       Balance Overall balance assessment: Needs assistance Sitting-balance support: No upper extremity supported, Feet supported Sitting balance-Leahy Scale: Normal     Standing balance support: Single extremity supported, During functional activity Standing balance-Leahy Scale: Fair Standing balance comment: looks for support but is able to stabilize with both static and dynamic balance at a contact guard level               High Level Balance Comments: continue to work on bending down to pick up items, stepping over items, and quick turns. pt with increased need for assist with small turns  Cognition Arousal: Alert Behavior During Therapy: Impulsive Overall Cognitive  Status: History of cognitive impairments - at baseline                                 General Comments: hx of cognitive impairment, impulsive, requires cues for safety and to ask for assistance. needs repeated cues to maintain corrections. very poor STM in regards to multi-step command following and application of instructions to functional task        Exercises Other Exercises Other Exercises: stepping over blue 4 square and onto step before picking up object from floor x5 Other Exercises: scavenger hunt for sticky notes with numbers and then letters. cues for visual scanning    General Comments General comments (skin integrity, edema, etc.): VSS      Pertinent Vitals/Pain Pain Assessment Pain Assessment: No/denies pain Pain Score: 0-No pain Faces Pain Scale: No hurt Pain Intervention(s): Monitored during session     PT Goals (current goals can now be found in the care plan section) Acute Rehab PT Goals Patient Stated Goal: to walk around PT Goal Formulation: With patient Time For Goal Achievement: 08/03/23 Potential to Achieve Goals: Good Progress towards PT goals: Progressing toward goals    Frequency    Min 1X/week      PT Plan Current plan remains appropriate       AM-PAC PT "6 Clicks" Mobility   Outcome Measure  Help needed turning from your back to your side while in a flat bed without using bedrails?: None Help needed moving from lying on your back to sitting on the side of a flat bed without using bedrails?: None Help needed moving to and from a bed to a chair (including a wheelchair)?: A Little Help needed standing up from a chair using your arms (e.g., wheelchair or bedside chair)?: A Little Help needed to walk in hospital room?: A Little Help needed climbing 3-5 steps with a railing? : A Little 6 Click Score: 20    End of Session Equipment Utilized During Treatment: Gait belt Activity Tolerance: Patient tolerated treatment  well Patient left: with call bell/phone within reach;in chair;with chair alarm set;with family/visitor present Nurse Communication: Mobility status PT Visit Diagnosis: Other abnormalities of gait and mobility (R26.89)     Time: 0102-7253 PT Time Calculation (min) (ACUTE ONLY): 23 min  Charges:    $Gait Training: 8-22 mins $Neuromuscular Re-education: 8-22 mins PT General Charges $$ ACUTE PT VISIT: 1 Visit                     Vickki Muff, PT, DPT   Acute Rehabilitation Department Office [phone] Secure Chat Communication Preferred   Ronnie Derby 08/13/2023, 4:09 PM

## 2023-08-13 NOTE — Progress Notes (Signed)
   08/13/23 1243  Mobility  Activity Ambulated with assistance in hallway  Level of Assistance Standby assist, set-up cues, supervision of patient - no hands on (CG for STS)  Assistive Device None;Other (Comment) (handrails for STS)  Distance Ambulated (ft) 550 ft  LLE Weight Bearing WBAT (with post op shoe)  Activity Response Tolerated well  Mobility Referral Yes  $Mobility charge 1 Mobility  Mobility Specialist Start Time (ACUTE ONLY) 1223  Mobility Specialist Stop Time (ACUTE ONLY) 1240  Mobility Specialist Time Calculation (min) (ACUTE ONLY) 17 min   Mobility Specialist: Progress Note  Pt agreeable to mobility session - received in EOB. Required CG from standing to sitting reaching for handrails, and SV with no AD. Took one seated break at the window. Pt had no complaints. Returned to EOB with all needs met - call bell within reach. Pt was able to doff post op shoe.  Barnie Mort, BS Mobility Specialist Please contact via SecureChat or Rehab office at [phone].

## 2023-08-13 NOTE — Plan of Care (Signed)
  Problem: Education: Goal: Ability to describe self-care measures that may prevent or decrease complications (Diabetes Survival Skills Education) will improve Outcome: Progressing   Problem: Coping: Goal: Ability to adjust to condition or change in health will improve Outcome: Progressing   Problem: Skin Integrity: Goal: Risk for impaired skin integrity will decrease Outcome: Progressing   

## 2023-08-13 NOTE — Plan of Care (Signed)
  Problem: Education: Goal: Ability to describe self-care measures that may prevent or decrease complications (Diabetes Survival Skills Education) will improve Outcome: Progressing   

## 2023-08-14 NOTE — Plan of Care (Signed)
Problem: Education: Goal: Knowledge of General Education information will improve Description Including pain rating scale, medication(s)/side effects and non-pharmacologic comfort measures Outcome: Progressing   Problem: Activity: Goal: Risk for activity intolerance will decrease Outcome: Progressing   Problem: Safety: Goal: Ability to remain free from injury will improve Outcome: Progressing
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
## 2023-08-14 NOTE — Plan of Care (Signed)
  Problem: Coping: Goal: Ability to adjust to condition or change in health will improve Outcome: Progressing   Problem: Health Behavior/Discharge Planning: Goal: Ability to manage health-related needs will improve Outcome: Progressing   Problem: Skin Integrity: Goal: Risk for impaired skin integrity will decrease Outcome: Progressing   Problem: Activity: Goal: Risk for activity intolerance will decrease Outcome: Progressing

## 2023-08-14 NOTE — Progress Notes (Signed)
Mobility Specialist Progress Note    08/14/23 1238  Mobility  Activity Ambulated with assistance in hallway  Level of Assistance Contact guard assist, steadying assist  Assistive Device Other (Comment) (HHA)  Distance Ambulated (ft) 500 ft  Activity Response Tolerated well  Mobility Referral Yes  $Mobility charge 1 Mobility  Mobility Specialist Start Time (ACUTE ONLY) 1224  Mobility Specialist Stop Time (ACUTE ONLY) 1238  Mobility Specialist Time Calculation (min) (ACUTE ONLY) 14 min   Pt received in bed and agreeable. No complaints on walk. Returned to bed with call bell in reach and bed alarm on.   [place] Mobility Specialist  Please Neurosurgeon or Rehab Office at [phone]

## 2023-08-14 NOTE — Plan of Care (Signed)
Problem: Health Behavior/Discharge Planning: Goal: Ability to manage health-related needs will improve Outcome: Progressing

## 2023-08-15 NOTE — Plan of Care (Signed)
  Problem: Coping: Goal: Ability to adjust to condition or change in health will improve Outcome: Not Progressing   D/T hx TBI   Problem: Health Behavior/Discharge Planning: Goal: Ability to manage health-related needs will improve Outcome: Not Progressing   D/T unsafe disposition    Problem: Health Behavior/Discharge Planning: Goal: Ability to manage health-related needs will improve Outcome: Not Progressing  D/T Unsafe disposition
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
## 2023-08-16 NOTE — Plan of Care (Signed)
  Problem: Coping: Goal: Ability to adjust to condition or change in health will improve Outcome: Progressing   Problem: Tissue Perfusion: Goal: Adequacy of tissue perfusion will improve Outcome: Progressing   Problem: Elimination: Goal: Will not experience complications related to bowel motility Outcome: Progressing   Problem: Pain Managment: Goal: General experience of comfort will improve Outcome: Progressing
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
## 2023-08-16 NOTE — Progress Notes (Addendum)
Mobility Specialist: Progress Note   08/16/23 1541  Mobility  Activity Ambulated with assistance in hallway  Level of Assistance Contact guard assist, steadying assist  Assistive Device None  Distance Ambulated (ft) 650 ft  LLE Weight Bearing WBAT  Activity Response Tolerated well  Mobility Referral Yes  $Mobility charge 1 Mobility  Mobility Specialist Start Time (ACUTE ONLY) 1059  Mobility Specialist Stop Time (ACUTE ONLY) 1109  Mobility Specialist Time Calculation (min) (ACUTE ONLY) 10 min    Pt was agreeable to mobility session - received in bed. Required less verbal and tactile cues today. Still working on information recall, but overall mobility has improved. Returned to room without fault. Left in bed with all needs met, call bell in reach. Bed alarm on.   [name] Mobility Specialist Please contact via SecureChat or Rehab office at [phone]

## 2023-08-16 NOTE — Plan of Care (Signed)
Problem: Education: Goal: Knowledge of General Education information will improve Description: Including pain rating scale, medication(s)/side effects and non-pharmacologic comfort measures Outcome: Progressing   Problem: Health Behavior/Discharge Planning: Goal: Ability to manage health-related needs will improve Outcome: Progressing   Problem: Activity: Goal: Risk for activity intolerance will decrease Outcome: Progressing

## 2023-08-16 NOTE — Plan of Care (Signed)
Problem: Education: Goal: Ability to describe self-care measures that may prevent or decrease complications (Diabetes Survival Skills Education) will improve Outcome: Not Progressing Goal: Individualized Educational Video(s) Outcome: Not Progressing   Problem: Coping: Goal: Ability to adjust to condition or change in health will improve Outcome: Not Progressing   Problem: Health Behavior/Discharge Planning: Goal: Ability to identify and utilize available resources and services will improve Outcome: Not Progressing Goal: Ability to manage health-related needs will improve Outcome: Not Progressing   Problem: Metabolic: Goal: Ability to maintain appropriate glucose levels will improve Outcome: Not Progressing   Problem: Nutritional: Goal: Progress toward achieving an optimal weight will improve Outcome: Not Progressing   Problem: Skin Integrity: Goal: Risk for impaired skin integrity will decrease Outcome: Not Progressing   Problem: Tissue Perfusion: Goal: Adequacy of tissue perfusion will improve Outcome: Not Progressing   Problem: Education: Goal: Knowledge of General Education information will improve Description: Including pain rating scale, medication(s)/side effects and non-pharmacologic comfort measures Outcome: Not Progressing   Problem: Health Behavior/Discharge Planning: Goal: Ability to manage health-related needs will improve Outcome: Not Progressing   Problem: Clinical Measurements: Goal: Ability to maintain clinical measurements within normal limits will improve Outcome: Not Progressing Goal: Will remain free from infection Outcome: Not Progressing Goal: Diagnostic test results will improve Outcome: Not Progressing Goal: Respiratory complications will improve Outcome: Not Progressing Goal: Cardiovascular complication will be avoided Outcome: Not Progressing   Problem: Activity: Goal: Risk for activity intolerance will decrease Outcome: Not  Progressing   Problem: Nutrition: Goal: Adequate nutrition will be maintained Outcome: Not Progressing   Problem: Coping: Goal: Level of anxiety will decrease Outcome: Not Progressing   Problem: Elimination: Goal: Will not experience complications related to bowel motility Outcome: Not Progressing Goal: Will not experience complications related to urinary retention Outcome: Not Progressing   Problem: Pain Managment: Goal: General experience of comfort will improve Outcome: Not Progressing   Problem: Safety: Goal: Ability to remain free from injury will improve Outcome: Not Progressing   Problem: Skin Integrity: Goal: Risk for impaired skin integrity will decrease Outcome: Not Progressing   Problem: Education: Goal: Knowledge of General Education information will improve Description: Including pain rating scale, medication(s)/side effects and non-pharmacologic comfort measures Outcome: Not Progressing   Problem: Health Behavior/Discharge Planning: Goal: Ability to manage health-related needs will improve Outcome: Not Progressing   Problem: Clinical Measurements: Goal: Ability to maintain clinical measurements within normal limits will improve Outcome: Not Progressing Goal: Will remain free from infection Outcome: Not Progressing Goal: Diagnostic test results will improve Outcome: Not Progressing Goal: Respiratory complications will improve Outcome: Not Progressing Goal: Cardiovascular complication will be avoided Outcome: Not Progressing   Problem: Activity: Goal: Risk for activity intolerance will decrease Outcome: Not Progressing   Problem: Nutrition: Goal: Adequate nutrition will be maintained Outcome: Not Progressing   Problem: Coping: Goal: Level of anxiety will decrease Outcome: Not Progressing   Problem: Elimination: Goal: Will not experience complications related to bowel motility Outcome: Not Progressing Goal: Will not experience complications  related to urinary retention Outcome: Not Progressing   Problem: Pain Managment: Goal: General experience of comfort will improve Outcome: Not Progressing   Problem: Safety: Goal: Ability to remain free from injury will improve Outcome: Not Progressing   Problem: Skin Integrity: Goal: Risk for impaired skin integrity will decrease Outcome: Not Progressing   Problem: Education: Goal: Knowledge of the prescribed therapeutic regimen will improve Outcome: Not Progressing Goal: Ability to verbalize activity precautions or restrictions will improve Outcome: Not Progressing Goal:  Understanding of discharge needs will improve Outcome: Not Progressing   Problem: Activity: Goal: Ability to perform//tolerate increased activity and mobilize with assistive devices will improve Outcome: Not Progressing

## 2023-08-17 NOTE — Progress Notes (Signed)
Physical Therapy Treatment Patient Details Name: [name] MRN: [id number] DOB: 1966/09/30 Today's Date: 08/17/2023   History of Present Illness Pt is a 57 year old male admitted for evaluation of being unable to get up and ambulate, and not eating for 2 days, admitted for left foot gangrene with osteomyelitis. 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    PT Comments  Continue to work on more fluidity of gait with less lateral sway, improves with distance and less distraction. Pt also able to perform stair training but continues to need cuing for sequencing. Despite constant level of general instability, pt is ale to perform gait and stairs at a supervision level. Pt would likely be able to live in an Spanish speaking ALF at discharge. PT will continue to work on stability with movement to decrease fall risk.    If plan is discharge home, recommend the following: A little help with walking and/or transfers;A little help with bathing/dressing/bathroom;Assistance with cooking/housework;Direct supervision/assist for medications management;Assist for transportation;Help with stairs or ramp for entrance   Can travel by private vehicle     No  Equipment Recommendations   (will continue to discern)    Recommendations for Other Services       Precautions / Restrictions Precautions Precautions: Fall;Other (comment) Precaution Comments: History of TBI with left side weakness and cognitive impairments Required Braces or Orthoses: Other Brace Other Brace: L post op shoe Restrictions Weight Bearing Restrictions: No LLE Weight Bearing: Weight bearing as tolerated Other Position/Activity Restrictions: Upgraded to WBAT with postop shoe.     Mobility  Bed Mobility Overal bed mobility: Needs Assistance, Modified Independent Bed Mobility: Supine to Sit     Supine to sit: Modified independent  (Device/Increase time) Sit to supine: Modified independent (Device/Increase time)   General bed mobility comments: increased time and effort to come to EoB    Transfers Overall transfer level: Needs assistance Equipment used: None Transfers: Sit to/from Stand Sit to Stand: Supervision           General transfer comment: steadies using very large BoS    Ambulation/Gait Ambulation/Gait assistance: Supervision Gait Distance (Feet): 1200 Feet Assistive device: None Gait Pattern/deviations: Shuffle, Staggering left, Staggering right, Wide base of support Gait velocity: sometimes to fast for conditions     General Gait Details: gait slightly more fluid today with long distance and decreased distraction, continues to have a lot of lateral movemetn   Stairs Stairs: Yes Stairs assistance: Supervision Stair Management: Two rails, Alternating pattern, Forwards Number of Stairs: 5 General stair comments: CGA with BUE support,         Balance Overall balance assessment: Needs assistance Sitting-balance support: No upper extremity supported, Feet supported Sitting balance-Leahy Scale: Normal     Standing balance support: Single extremity supported, During functional activity Standing balance-Leahy Scale: Fair Standing balance comment: looks for support but is able to stabilize with both static and dynamic balance at a contact guard level               High Level Balance Comments: continue to work on bending down to pick up items, stepping over items, and quick turns. pt with increased need for assist with small turns            Cognition Arousal: Alert Behavior During Therapy: Impulsive Overall Cognitive Status: History of cognitive impairments - at baseline  General Comments: hx of cognitive impairment, impulsive, requires cues for safety and to ask for assistance. needs repeated cues to maintain corrections. very  poor STM in regards to multi-step command following and application of instructions to functional task           General Comments General comments (skin integrity, edema, etc.): VSS      Pertinent Vitals/Pain Pain Assessment Pain Assessment: No/denies pain Faces Pain Scale: No hurt     PT Goals (current goals can now be found in the care plan section) Acute Rehab PT Goals Patient Stated Goal: to walk around PT Goal Formulation: With patient Time For Goal Achievement: 08/03/23 Potential to Achieve Goals: Good Progress towards PT goals: Progressing toward goals    Frequency    Min 1X/week      PT Plan Current plan remains appropriate       AM-PAC PT "6 Clicks" Mobility   Outcome Measure  Help needed turning from your back to your side while in a flat bed without using bedrails?: None Help needed moving from lying on your back to sitting on the side of a flat bed without using bedrails?: None Help needed moving to and from a bed to a chair (including a wheelchair)?: A Little Help needed standing up from a chair using your arms (e.g., wheelchair or bedside chair)?: A Little Help needed to walk in hospital room?: A Little Help needed climbing 3-5 steps with a railing? : A Little 6 Click Score: 20    End of Session Equipment Utilized During Treatment: Gait belt Activity Tolerance: Patient tolerated treatment well Patient left: with call bell/phone within reach;in chair;with chair alarm set;with family/visitor present Nurse Communication: Mobility status PT Visit Diagnosis: Other abnormalities of gait and mobility (R26.89)     Time: 1343-1401 PT Time Calculation (min) (ACUTE ONLY): 18 min  Charges:    $Gait Training: 8-22 mins PT General Charges $$ ACUTE PT VISIT: 1 Visit                     Gianella Chismar B. Beverely Risen PT, DPT Acute Rehabilitation Services Please use secure chat or  Call Office [phone]    Elon Alas [facility] 08/17/2023, 3:40  PM

## 2023-08-17 NOTE — Progress Notes (Signed)
Mobility Specialist: Progress Note   08/17/23 1149  Mobility  Activity Ambulated with assistance in hallway  Level of Assistance Contact guard assist, steadying assist  Assistive Device None  Distance Ambulated (ft) 550 ft  LLE Weight Bearing WBAT  Activity Response Tolerated well  Mobility Referral Yes  $Mobility charge 1 Mobility  Mobility Specialist Start Time (ACUTE ONLY) [id number]  Mobility Specialist Stop Time (ACUTE ONLY) [id number]  Mobility Specialist Time Calculation (min) (ACUTE ONLY) 9 min    Pt was agreeable to mobility session - received in bed. Interpreter Bonnye Fava was present and helpful throughout session. Pt required multiple verbal and tactile cues to take longer, slower steps. Initially SV but required CG d/t becoming unsteady a few times throughout session. Returned to room without fault. Left in bed with all needs met, call bell in reach.  [name] Mobility Specialist Please contact via SecureChat or Rehab office at [phone]

## 2023-08-17 NOTE — Plan of Care (Signed)
Problem: Education: Goal: Ability to describe self-care measures that may prevent or decrease complications (Diabetes Survival Skills Education) will improve Outcome: Not Progressing Goal: Individualized Educational Video(s) Outcome: Not Progressing   Problem: Coping: Goal: Ability to adjust to condition or change in health will improve Outcome: Not Progressing   Problem: Health Behavior/Discharge Planning: Goal: Ability to identify and utilize available resources and services will improve Outcome: Not Progressing Goal: Ability to manage health-related needs will improve Outcome: Not Progressing   Problem: Metabolic: Goal: Ability to maintain appropriate glucose levels will improve Outcome: Not Progressing   Problem: Nutritional: Goal: Progress toward achieving an optimal weight will improve Outcome: Not Progressing   Problem: Skin Integrity: Goal: Risk for impaired skin integrity will decrease Outcome: Not Progressing   Problem: Tissue Perfusion: Goal: Adequacy of tissue perfusion will improve Outcome: Not Progressing   Problem: Education: Goal: Knowledge of General Education information will improve Description: Including pain rating scale, medication(s)/side effects and non-pharmacologic comfort measures Outcome: Not Progressing   Problem: Health Behavior/Discharge Planning: Goal: Ability to manage health-related needs will improve Outcome: Not Progressing   Problem: Clinical Measurements: Goal: Ability to maintain clinical measurements within normal limits will improve Outcome: Not Progressing Goal: Will remain free from infection Outcome: Not Progressing Goal: Diagnostic test results will improve Outcome: Not Progressing Goal: Respiratory complications will improve Outcome: Not Progressing Goal: Cardiovascular complication will be avoided Outcome: Not Progressing   Problem: Activity: Goal: Risk for activity intolerance will decrease Outcome: Not  Progressing   Problem: Nutrition: Goal: Adequate nutrition will be maintained Outcome: Not Progressing   Problem: Coping: Goal: Level of anxiety will decrease Outcome: Not Progressing   Problem: Elimination: Goal: Will not experience complications related to bowel motility Outcome: Not Progressing Goal: Will not experience complications related to urinary retention Outcome: Not Progressing   Problem: Pain Managment: Goal: General experience of comfort will improve Outcome: Not Progressing   Problem: Safety: Goal: Ability to remain free from injury will improve Outcome: Not Progressing   Problem: Skin Integrity: Goal: Risk for impaired skin integrity will decrease Outcome: Not Progressing   Problem: Education: Goal: Knowledge of General Education information will improve Description: Including pain rating scale, medication(s)/side effects and non-pharmacologic comfort measures Outcome: Not Progressing   Problem: Health Behavior/Discharge Planning: Goal: Ability to manage health-related needs will improve Outcome: Not Progressing   Problem: Clinical Measurements: Goal: Ability to maintain clinical measurements within normal limits will improve Outcome: Not Progressing Goal: Will remain free from infection Outcome: Not Progressing Goal: Diagnostic test results will improve Outcome: Not Progressing Goal: Respiratory complications will improve Outcome: Not Progressing Goal: Cardiovascular complication will be avoided Outcome: Not Progressing   Problem: Activity: Goal: Risk for activity intolerance will decrease Outcome: Not Progressing   Problem: Nutrition: Goal: Adequate nutrition will be maintained Outcome: Not Progressing   Problem: Coping: Goal: Level of anxiety will decrease Outcome: Not Progressing   Problem: Elimination: Goal: Will not experience complications related to bowel motility Outcome: Not Progressing Goal: Will not experience complications  related to urinary retention Outcome: Not Progressing   Problem: Pain Managment: Goal: General experience of comfort will improve Outcome: Not Progressing   Problem: Safety: Goal: Ability to remain free from injury will improve Outcome: Not Progressing   Problem: Skin Integrity: Goal: Risk for impaired skin integrity will decrease Outcome: Not Progressing   Problem: Education: Goal: Knowledge of the prescribed therapeutic regimen will improve Outcome: Not Progressing Goal: Ability to verbalize activity precautions or restrictions will improve Outcome: Not Progressing Goal:  Understanding of discharge needs will improve Outcome: Not Progressing   Problem: Activity: Goal: Ability to perform//tolerate increased activity and mobilize with assistive devices will improve Outcome: Not Progressing

## 2023-08-17 NOTE — Progress Notes (Signed)
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 He underwent left TMA with wound VAC by Dr. Lajoyce Corners on 05/05/2023. Therapy recommended home health but patient has no safe disposition. Per sister's report to [facility], "he was staying with some people because he couldn't stay with sister anymore due to violent behaviors. Sister was bringing food and they were letting him stay for free. And now those people are saying he can't come back and sister can't take him either". TOC filed APS report. Now a placement issue. Wound VAC has been removed, suture removed 9/09.  Patient has been stable for discharge.   Subjective / 24h Interval events: No complaints.  Asking for more coffee  Assesement and Plan: Principal Problem:   Cellulitis of left foot Active Problems:   Essential hypertension   Type 2 diabetes mellitus with peripheral neuropathy (HCC)   Seizure disorder (HCC)   MDD (major depressive disorder), recurrent episode, moderate (HCC)   History of traumatic brain injury   Cutaneous abscess of left foot   PAD (peripheral artery disease) (HCC)   Severe protein-calorie malnutrition (HCC)   Principal problem Left foot cellulitis with gangrene and osteomyelitis - Vascular surgery consulted, suggested no indication for vascular intervention. S/p left TMA and wound VAC by Dr. Lajoyce Corners.  Wound healed.  WBAT. Stable for discharge but difficult placement.   Active problems History of TBI/cognitive impairment and intermittent aggressive behaviors/depression - Psychiatry consulted appreciate recommendations,  unfortunately patient does not  currently have capacity. Continue with Risperdal, Prozac and hydroxyzine.   Controlled DM-2 - A1c 5.8% on 7/25.  CBG has been fairly controlled. Discontinued insulin and CBG monitoring. Started low-dose metformin on 10/5.   Unsafe living situation -TOC and APS involved.   Anemia of chronic disease -Monitor intermittently    History of seizures -Continue with Keppra 500 mg twice daily.   Essential hypertension - Normotensive for most part.  Not on meds.   Hyponatremia - Resolved   GERD -On PPI.    Hyperlipidemia -Continue statin   Scheduled Meds:  vitamin C  1,000 mg Oral Daily   atorvastatin  10 mg Oral Daily   enoxaparin (LOVENOX) injection  40 mg Subcutaneous Daily   feeding supplement (GLUCERNA SHAKE)  237 mL Oral BID BM   FLUoxetine  30 mg Oral Daily   levETIRAcetam  500 mg Oral BID   metFORMIN  500 mg Oral BID WC   multivitamins with iron  1 tablet Oral Daily   nutrition supplement (JUVEN)  1 packet Oral BID BM   pantoprazole  40 mg Oral Daily   risperiDONE  0.25 mg Oral QHS   sodium chloride flush  3 mL Intravenous Q12H   Continuous Infusions: PRN Meds:.acetaminophen **OR** acetaminophen, alum & mag hydroxide-simeth, bisacodyl, loperamide, ondansetron **OR** ondansetron (ZOFRAN) IV, mouth rinse, polyethylene glycol  Current Outpatient Medications  Medication Instructions   atorvastatin (LIPITOR) 10 mg, Oral, Daily   ferrous sulfate 324 mg, Oral, Daily with breakfast   FLUoxetine (PROZAC) 30 mg, Oral, Daily   hydrOXYzine (ATARAX) 10 mg, Oral, 3 times daily PRN   levETIRAcetam (KEPPRA) 500 MG tablet Take 1.5 tablets (750mg ) by mouth twice daily.   lisinopril (ZESTRIL)  2.5 mg, Oral, Daily   metFORMIN (GLUCOPHAGE) 1,000 mg, Oral, 2 times daily with meals   risperiDONE (RISPERDAL) 0.25 mg, Oral, Daily at bedtime   vitamin B-12 (CYANOCOBALAMIN) 100 mcg, Oral, Daily    Diet Orders (From admission, onward)     Start     Ordered   06/12/23 1309  Diet Carb Modified  Fluid consistency: Thin; Room service appropriate? No  Diet effective now       Question Answer Comment  Calorie Level Medium 1600-2000   Fluid consistency: Thin   Room service appropriate? No      06/12/23 1309            DVT prophylaxis: SCD's Start: 06/02/23 1515 enoxaparin (LOVENOX) injection 40 mg Start: 05/27/23 1000   Lab Results  Component Value Date   PLT 208 08/12/2023      Code Status: Full Code  Family Communication: no family at bedside   Status is: Inpatient Remains inpatient appropriate because: placement issues  Level of care: Med-Surg  Consultants:  Psychiatry Orthopedics  Objective: Vitals:   08/16/23 1450 08/16/23 2133 08/17/23 0507 08/17/23 0817  BP: 119/77 106/64 127/79 130/81  Pulse: 79 78 78 81  Resp:  18 18 18   Temp: 98 F (36.7 C) 98.2 F (36.8 C) 98.1 F (36.7 C) 98.8 F (37.1 C)  TempSrc: Oral Oral Oral Oral  SpO2: 97% 98% 98% 100%  Weight:        Intake/Output Summary (Last 24 hours) at 08/17/2023 0937 Last data filed at 08/16/2023 1400 Gross per 24 hour  Intake 240 ml  Output 850 ml  Net -610 ml   Wt Readings from Last 3 Encounters:  05/26/23 54.6 kg  03/11/23 59.4 kg  02/18/23 59 kg    Examination:  Constitutional: NAD  Data Reviewed: I have independently reviewed following labs and imaging studies   CBC Recent Labs  Lab 08/12/23 0556  WBC 4.7  HGB 11.4*  HCT 33.7*  PLT 208  MCV 84.9  MCH 28.7  MCHC 33.8  RDW 12.3    Recent Labs  Lab 08/12/23 0556  NA 138  K 4.4  CL 102  CO2 27  GLUCOSE 112*  BUN 36*  CREATININE 1.14  CALCIUM 9.1  AST 26  ALT 62*  ALKPHOS 49  BILITOT 0.6  ALBUMIN 3.2*  MG 1.7    ------------------------------------------------------------------------------------------------------------------ No results for input(s): "CHOL", "HDL", "LDLCALC", "TRIG", "CHOLHDL", "LDLDIRECT" in the last 72 hours.  Lab Results  Component Value Date   HGBA1C 5.8 (H) 05/27/2023    ------------------------------------------------------------------------------------------------------------------ No results for input(s): "TSH", "T4TOTAL", "T3FREE", "THYROIDAB" in the last 72 hours.  Invalid input(s): "FREET3"  Cardiac Enzymes No results for input(s): "CKMB", "TROPONINI", "MYOGLOBIN" in the last 168 hours.  Invalid input(s): "CK" ------------------------------------------------------------------------------------------------------------------ No results found for: "BNP"  CBG: Recent Labs  Lab 08/13/23 1214 08/13/23 1628  GLUCAP 92 104*    No results found for this or any previous visit (from the past 240 hour(s)).   Radiology Studies: No results found.   Pamella Pert, MD, PhD [facility]  Between 7 am - 7 pm I am available, please contact me via Amion (for emergencies) or Securechat (non urgent messages)  Between 7 pm - 7 am I am not available, please contact night coverage MD/APP via Amion

## 2023-08-17 NOTE — Plan of Care (Signed)
  Problem: Coping: Goal: Ability to adjust to condition or change in health will improve Outcome: Progressing   Problem: Nutritional: Goal: Progress toward achieving an optimal weight will improve Outcome: Progressing   Problem: Skin Integrity: Goal: Risk for impaired skin integrity will decrease Outcome: Progressing   Problem: Tissue Perfusion: Goal: Adequacy of tissue perfusion will improve Outcome: Progressing   Problem: Nutrition: Goal: Adequate nutrition will be maintained Outcome: Progressing

## 2023-08-18 LAB — CBC
HCT: 32.9 % — ABNORMAL LOW (ref 39.0–52.0)
Hemoglobin: 11.6 g/dL — ABNORMAL LOW (ref 13.0–17.0)
MCH: 29.8 pg (ref 26.0–34.0)
MCHC: 35.3 g/dL (ref 30.0–36.0)
MCV: 84.6 fL (ref 80.0–100.0)
Platelets: 213 10*3/uL (ref 150–400)
RBC: 3.89 MIL/uL — ABNORMAL LOW (ref 4.22–5.81)
RDW: 12.5 % (ref 11.5–15.5)
WBC: 5.8 10*3/uL (ref 4.0–10.5)
nRBC: 0 % (ref 0.0–0.2)

## 2023-08-18 LAB — COMPREHENSIVE METABOLIC PANEL
ALT: 57 U/L — ABNORMAL HIGH (ref 0–44)
AST: 29 U/L (ref 15–41)
Albumin: 3.2 g/dL — ABNORMAL LOW (ref 3.5–5.0)
Alkaline Phosphatase: 50 U/L (ref 38–126)
Anion gap: 8 (ref 5–15)
BUN: 29 mg/dL — ABNORMAL HIGH (ref 6–20)
CO2: 27 mmol/L (ref 22–32)
Calcium: 9 mg/dL (ref 8.9–10.3)
Chloride: 101 mmol/L (ref 98–111)
Creatinine, Ser: 1.04 mg/dL (ref 0.61–1.24)
GFR, Estimated: >60 mL/min (ref >60)
Glucose, Bld: 109 mg/dL — ABNORMAL HIGH (ref 70–99)
Potassium: 4.2 mmol/L (ref 3.5–5.1)
Sodium: 136 mmol/L (ref 135–145)
Total Bilirubin: 0.3 mg/dL (ref 0.3–1.2)
Total Protein: 6.5 g/dL (ref 6.5–8.1)

## 2023-08-18 LAB — MAGNESIUM: Magnesium: 1.8 mg/dL (ref 1.7–2.4)

## 2023-08-18 NOTE — Progress Notes (Signed)
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 He underwent left TMA with wound VAC by Dr. Lajoyce Corners on 05/05/2023. Therapy recommended home health but patient has no safe disposition. Per sister's report to [facility], "he was staying with some people because he couldn't stay with sister anymore due to violent behaviors. Sister was bringing food and they were letting him stay for free. And now those people are saying he can't come back and sister can't take him either". TOC filed APS report. Now a placement issue. Wound VAC has been removed, suture removed 9/09.  Patient has been stable for discharge.     Principal problem Left foot cellulitis with gangrene and osteomyelitis - Vascular surgery consulted, suggested no indication for vascular intervention. S/p left TMA and wound VAC by Dr. Lajoyce Corners.  Wound healed.  WBAT.  Patient currently stable for discharge, TOC manager working on placement   Active problems History of TBI/cognitive impairment and intermittent aggressive behaviors/depression - Psychiatry consulted appreciate recommendations,  unfortunately patient does not currently have capacity.  Continue with Risperdal, Prozac and hydroxyzine.   Controlled DM-2 - A1c 5.8% on 7/25.  CBG has been fairly controlled. Discontinued insulin  Continue current glucose monitoring   Unsafe living situation - [facility] on board as well as APS is  involved   Anemia of chronic disease -Monitor intermittently    History of seizures -continue with Keppra 500 mg twice daily.   Essential hypertension - Normotensive for most part.  Not on meds.   Hyponatremia - Resolved Monitor as needed   GERD -continue PPI therapy   Hyperlipidemia -continue statin therapy  DVT prophylaxis: Lovenox    Code Status: Full Code   Family Communication: no family at bedside    Status is: Inpatient Remains inpatient appropriate because: placement issues   Level of care: Med-Surg   Consultants:  Psychiatry Orthopedics  Physical examination Constitutional: Middle-age male in no acute distress Eyes: PERTLA, lids and conjunctivae normal ENMT: Mucous membranes are moist.  Neck: supple, no masses  Respiratory: no wheezing, no crackles. No accessory muscle use.  Cardiovascular: S1 & S2 heard, regular rate and rhythm. No JVD. Abdomen: No distension, no tenderness Musculoskeletal: no clubbing / cyanosis.  Skin: Dressing noted to the left foot is clean and dry Neurologic: Alert and awake able to engage in conversation Psychiatric: Calm and cooperative   Data Reviewed: Reviewed patient's records including above vitals as well as all labs as shown, orthopedics documentation, TOC manager documentation, previous provider documentation as well as PT OT notes  Time spent: 45 minutes   Vitals:   08/17/23 2045 08/18/23 0554 08/18/23 0800 08/18/23 1455  BP: 115/75 104/66 126/77 103/69  Pulse: 81 81 77 84  Resp: 17 17 16 18   Temp: 98.6 F (37 C) 98.2 F (36.8 C) 98 F (36.7 C) 98.6 F (37 C)  TempSrc:  Oral Oral Oral  SpO2: 98% 97% 98% 97%  Weight:  Latest Ref Rng & Units 08/18/2023    6:24 AM 08/12/2023    5:56 AM 08/04/2023    6:11 AM  CBC  WBC 4.0 - 10.5 K/uL 5.8  4.7  5.2   Hemoglobin 13.0 - 17.0 g/dL 32.4  40.1  02.7   Hematocrit 39.0 - 52.0 % 32.9  33.7  33.6   Platelets 150 - 400 K/uL 213  208  198        Latest Ref Rng  & Units 08/18/2023    6:24 AM 08/12/2023    5:56 AM 08/04/2023    6:11 AM  BMP  Glucose 70 - 99 mg/dL 253  664  403   BUN 6 - 20 mg/dL 29  36  33   Creatinine 0.61 - 1.24 mg/dL 4.74  2.59  5.63   Sodium 135 - 145 mmol/L 136  138  137   Potassium 3.5 - 5.1 mmol/L 4.2  4.4  4.2   Chloride 98 - 111 mmol/L 101  102  105   CO2 22 - 32 mmol/L 27  27  26    Calcium 8.9 - 10.3 mg/dL 9.0  9.1  9.0       Author: Loyce Dys, MD 08/18/2023 4:14 PM  For on call review [email or web address].

## 2023-08-18 NOTE — Progress Notes (Signed)
Mobility Specialist: Progress Note   08/18/23 1149  Mobility  Activity Ambulated with assistance in hallway  Level of Assistance Standby assist, set-up cues, supervision of patient - no hands on  Assistive Device None  Distance Ambulated (ft) 650 ft  LLE Weight Bearing WBAT  Activity Response Tolerated well  Mobility Referral Yes  $Mobility charge 1 Mobility  Mobility Specialist Start Time (ACUTE ONLY) [id number]  Mobility Specialist Stop Time (ACUTE ONLY) 0905  Mobility Specialist Time Calculation (min) (ACUTE ONLY) 12 min    Pt was agreeable to mobility session - received in bed. Mostly SV but required CG at times when receiving instructions or practicing proper techniques. Required multiple verbal and tactile cues for gait. Returned to room without fault. Left in bed with all needs met, call bell in reach. Bed alarm on.   [name] Mobility Specialist Please contact via SecureChat or Rehab office at [phone]

## 2023-08-19 NOTE — Progress Notes (Signed)
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 He underwent left TMA with wound VAC by Dr. Lajoyce Corners on 05/05/2023. Therapy recommended home health but patient has no safe disposition. Per sister's report to [facility], "he was staying with some people because he couldn't stay with sister anymore due to violent behaviors. Sister was bringing food and they were letting him stay for free. And now those people are saying he can't come back and sister can't take him either". TOC filed APS report. Now a placement issue. Wound VAC has been removed, suture removed 9/09.  Patient has been stable for discharge.      Principal problem Left foot cellulitis with gangrene and osteomyelitis - Vascular surgery consulted, suggested no indication for vascular intervention. S/p left TMA and wound VAC by Dr. Lajoyce Corners.  Wound healed.  WBAT.  Likely stable TOC manager working on placement   Active problems History of TBI/cognitive impairment and intermittent aggressive behaviors/depression - Psychiatry consulted appreciate recommendations,  unfortunately patient does not currently have capacity.  Continue with Risperdal, Prozac and hydroxyzine.   Controlled DM-2 - A1c 5.8% on 7/25.  CBG has been fairly controlled. Discontinued insulin  Continue current glucose monitoring   Unsafe living situation - [facility] on board as well as APS is involved   Anemia of chronic disease -continue to  monitor intermittently    History of seizures -continue with Keppra 500 mg twice daily.   Essential hypertension - Normotensive for most part.  Not on meds.   Hyponatremia - Resolved Monitor as needed   GERD -continue PPI therapy   Hyperlipidemia -continue statin therapy   DVT prophylaxis: Lovenox     Code Status: Full Code   Family Communication: no family at bedside    Status is: Inpatient Remains inpatient appropriate because: placement issues   Level of care: Med-Surg   Consultants:  Psychiatry Orthopedics   Physical examination Constitutional: Middle-age male laying in bed in no acute distress Eyes: PERTLA, lids and conjunctivae normal ENMT: Mucous membranes are moist.  Neck: supple, no masses  Respiratory: no wheezing, no crackles. No accessory muscle use.  Cardiovascular: S1 & S2 heard, regular rate and rhythm. No JVD. Abdomen: No distension, no tenderness Musculoskeletal: no clubbing / cyanosis.  Skin: Dressing noted to the left foot is clean and dry Neurologic: Alert and awake able to engage in conversation Psychiatric: Calm and cooperative     Data Reviewed: I have reviewed nursing documentation, physical therapy documentation as well as OT documentation and vitals and labs listed below  Time spent: 40 minutes        Latest Ref Rng & Units 08/18/2023    6:24 AM 08/12/2023    5:56 AM 08/04/2023    6:11 AM  CBC  WBC 4.0 - 10.5 K/uL 5.8  4.7  5.2   Hemoglobin 13.0 - 17.0 g/dL 81.1  91.4  78.2   Hematocrit 39.0 - 52.0 % 32.9  33.7  33.6   Platelets 150 - 400  K/uL 213  208  198      Vitals:   08/18/23 0800 08/18/23 1455 08/18/23 2143 08/19/23 0746  BP: 126/77 103/69 112/70 (!) 102/53  Pulse: 77 84 92 95  Resp: 16 18  18   Temp: 98 F (36.7 C) 98.6 F (37 C) 98.5 F (36.9 C) 98 F (36.7 C)  TempSrc: Oral Oral Oral Oral  SpO2: 98% 97% 96% 95%  Weight:         Author: Loyce Dys, MD 08/19/2023 2:45 PM  For on call review  [email or web address].

## 2023-08-19 NOTE — Progress Notes (Signed)
Mobility Specialist: Progress Note   08/19/23 1504  Mobility  Activity Ambulated with assistance in hallway  Level of Assistance Contact guard assist, steadying assist  Assistive Device None  Distance Ambulated (ft) 550 ft  LLE Weight Bearing WBAT  Activity Response Tolerated well  Mobility Referral Yes  $Mobility charge 1 Mobility  Mobility Specialist Start Time (ACUTE ONLY) 1053  Mobility Specialist Stop Time (ACUTE ONLY) 1104  Mobility Specialist Time Calculation (min) (ACUTE ONLY) 11 min    Pt was agreeable to mobility session - received in bed. Required max verbal and tactile cues to take longer, slower steps. Able to follow directions under CG for about 3-4 steps before reverting back to shuffled, quick gait. Returned to room without fault. Left in bed with all needs met, call bell in reach. Bed alarm on.   [name] Mobility Specialist Please contact via SecureChat or Rehab office at [phone]

## 2023-08-19 NOTE — Plan of Care (Signed)
Problem: Education: Goal: Ability to describe self-care measures that may prevent or decrease complications (Diabetes Survival Skills Education) will improve Outcome: Progressing Goal: Individualized Educational Video(s) Outcome: Progressing   Problem: Coping: Goal: Ability to adjust to condition or change in health will improve Outcome: Progressing   Problem: Health Behavior/Discharge Planning: Goal: Ability to identify and utilize available resources and services will improve Outcome: Progressing Goal: Ability to manage health-related needs will improve Outcome: Progressing   Problem: Metabolic: Goal: Ability to maintain appropriate glucose levels will improve Outcome: Progressing   Problem: Nutritional: Goal: Progress toward achieving an optimal weight will improve Outcome: Progressing   Problem: Skin Integrity: Goal: Risk for impaired skin integrity will decrease Outcome: Progressing   Problem: Tissue Perfusion: Goal: Adequacy of tissue perfusion will improve Outcome: Progressing   Problem: Education: Goal: Knowledge of General Education information will improve Description: Including pain rating scale, medication(s)/side effects and non-pharmacologic comfort measures Outcome: Progressing   Problem: Health Behavior/Discharge Planning: Goal: Ability to manage health-related needs will improve Outcome: Progressing   Problem: Clinical Measurements: Goal: Ability to maintain clinical measurements within normal limits will improve Outcome: Progressing Goal: Will remain free from infection Outcome: Progressing Goal: Diagnostic test results will improve Outcome: Progressing Goal: Respiratory complications will improve Outcome: Progressing Goal: Cardiovascular complication will be avoided Outcome: Progressing   Problem: Activity: Goal: Risk for activity intolerance will decrease Outcome: Progressing   Problem: Nutrition: Goal: Adequate nutrition will be  maintained Outcome: Progressing   Problem: Coping: Goal: Level of anxiety will decrease Outcome: Progressing   Problem: Elimination: Goal: Will not experience complications related to bowel motility Outcome: Progressing Goal: Will not experience complications related to urinary retention Outcome: Progressing   Problem: Pain Managment: Goal: General experience of comfort will improve Outcome: Progressing   Problem: Safety: Goal: Ability to remain free from injury will improve Outcome: Progressing   Problem: Skin Integrity: Goal: Risk for impaired skin integrity will decrease Outcome: Progressing   Problem: Education: Goal: Knowledge of General Education information will improve Description: Including pain rating scale, medication(s)/side effects and non-pharmacologic comfort measures Outcome: Progressing   Problem: Health Behavior/Discharge Planning: Goal: Ability to manage health-related needs will improve Outcome: Progressing   Problem: Clinical Measurements: Goal: Ability to maintain clinical measurements within normal limits will improve Outcome: Progressing Goal: Will remain free from infection Outcome: Progressing Goal: Diagnostic test results will improve Outcome: Progressing Goal: Respiratory complications will improve Outcome: Progressing Goal: Cardiovascular complication will be avoided Outcome: Progressing   Problem: Activity: Goal: Risk for activity intolerance will decrease Outcome: Progressing   Problem: Nutrition: Goal: Adequate nutrition will be maintained Outcome: Progressing   Problem: Coping: Goal: Level of anxiety will decrease Outcome: Progressing   Problem: Elimination: Goal: Will not experience complications related to bowel motility Outcome: Progressing Goal: Will not experience complications related to urinary retention Outcome: Progressing   Problem: Pain Managment: Goal: General experience of comfort will improve Outcome:  Progressing   Problem: Safety: Goal: Ability to remain free from injury will improve Outcome: Progressing   Problem: Skin Integrity: Goal: Risk for impaired skin integrity will decrease Outcome: Progressing   Problem: Education: Goal: Knowledge of the prescribed therapeutic regimen will improve Outcome: Progressing Goal: Ability to verbalize activity precautions or restrictions will improve Outcome: Progressing Goal: Understanding of discharge needs will improve Outcome: Progressing   Problem: Activity: Goal: Ability to perform//tolerate increased activity and mobilize with assistive devices will improve Outcome: Progressing   Problem: Clinical Measurements: Goal: Postoperative complications will be avoided or minimized Outcome:  Progressing   Problem: Self-Care: Goal: Ability to meet self-care needs will improve Outcome: Progressing

## 2023-08-19 NOTE — Progress Notes (Signed)
Physical Therapy Treatment Patient Details Name: [name] MRN: [id number] DOB: Dec 29, 1965 Today's Date: 08/19/2023   History of Present Illness Pt is a 57 year old male admitted for evaluation of being unable to get up and ambulate, and not eating for 2 days, admitted for left foot gangrene with osteomyelitis. 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    PT Comments  Utilized In-house interpreter Raquel throughout session. 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 Pt did not have other sneaker in room but will ask sister to bring it. Pt with increase impulsivity and decreased command follow. Focus of session to be on safety picking things up off the ground however, pt with difficulty listening to commands before trying to complete task. Working on squatting, instead of leaning completely forward,because of loss of balance he experiences forward requiting UE support from the ground and difficulty coming back to standing. Improved command follow when performing tasks that he has previously completed walking to windows in the hallway.     If plan is discharge home, recommend the following: A little help with walking and/or transfers;A little help with bathing/dressing/bathroom;Assistance with cooking/housework;Direct supervision/assist for medications management;Assist for transportation;Help with stairs or ramp for entrance   Can travel by private vehicle     No  Equipment Recommendations   (will continue to discern)       Precautions / Restrictions Precautions Precautions: Fall;Other (comment) Precaution Comments: History of TBI with left side weakness and cognitive impairments Required Braces or Orthoses: Other Brace Other Brace: L post op shoe Restrictions Weight Bearing Restrictions:  No LLE Weight Bearing: Weight bearing as tolerated Other Position/Activity Restrictions: Upgraded to WBAT with postop shoe.     Mobility  Bed Mobility Overal bed mobility: Needs Assistance, Modified Independent Bed Mobility: Supine to Sit       Sit to supine: Modified independent (Device/Increase time)   General bed mobility comments: sitting EoB eating lunch    Transfers Overall transfer level: Needs assistance Equipment used: None Transfers: Sit to/from Stand Sit to Stand: Supervision           General transfer comment: steadies using very large BoS    Ambulation/Gait Ambulation/Gait assistance: Supervision Gait Distance (Feet): 500 Feet Assistive device: None Gait Pattern/deviations: Shuffle, Staggering left, Staggering right, Wide base of support Gait velocity: sometimes to fast for conditions Gait velocity interpretation: <1.8 ft/sec, indicate of risk for recurrent falls   General Gait Details: gait slightly more fluid today with long distance and decreased distraction, continues to have a lot of lateral movemetn   Stairs Stairs: Yes Stairs assistance: Supervision Stair Management: Two rails, Alternating pattern, Forwards Number of Stairs: 5 General stair comments: CGA with BUE support,       Balance Overall balance assessment: Needs assistance Sitting-balance support: No upper extremity supported, Feet supported Sitting balance-Leahy Scale: Normal     Standing balance support: Single extremity supported, During functional activity Standing balance-Leahy Scale: Fair Standing balance comment: looks for support but is able to stabilize with both static and dynamic balance at a contact guard level             High level balance activites: Other (comment) High Level Balance Comments: working on squatting to pick up objects off the ground            Cognition Arousal:  Alert Behavior During Therapy: Impulsive Overall Cognitive Status: History  of cognitive impairments - at baseline                                 General Comments: hx of cognitive impairment, impulsive, requires cues for safety and to ask for assistance. needs repeated cues to maintain corrections. very poor STM in regards to multi-step command following and application of instructions to functional task           General Comments General comments (skin integrity, edema, etc.): VSS      Pertinent Vitals/Pain Pain Assessment Pain Assessment: No/denies pain Faces Pain Scale: No hurt           PT Goals (current goals can now be found in the care plan section) Acute Rehab PT Goals Patient Stated Goal: to walk around PT Goal Formulation: With patient Time For Goal Achievement: 08/03/23 Potential to Achieve Goals: Good Progress towards PT goals: Progressing toward goals    Frequency    Min 1X/week      PT Plan Current plan remains appropriate       AM-PAC PT "6 Clicks" Mobility   Outcome Measure  Help needed turning from your back to your side while in a flat bed without using bedrails?: None Help needed moving from lying on your back to sitting on the side of a flat bed without using bedrails?: None Help needed moving to and from a bed to a chair (including a wheelchair)?: A Little Help needed standing up from a chair using your arms (e.g., wheelchair or bedside chair)?: A Little Help needed to walk in hospital room?: A Little Help needed climbing 3-5 steps with a railing? : A Little 6 Click Score: 20    End of Session Equipment Utilized During Treatment: Gait belt Activity Tolerance: Patient tolerated treatment well Patient left: with call bell/phone within reach;in chair;with chair alarm set;with family/visitor present Nurse Communication: Mobility status PT Visit Diagnosis: Other abnormalities of gait and mobility (R26.89)     Time: 4010-2725 PT Time Calculation (min) (ACUTE ONLY): 23 min  Charges:    $Gait  Training: 8-22 mins $Therapeutic Activity: 8-22 mins PT General Charges $$ ACUTE PT VISIT: 1 Visit                     Jeselle Hiser B. Beverely Risen PT, DPT Acute Rehabilitation Services Please use secure chat or  Call Office [phone]    Elon Alas [facility] 08/19/2023, 4:24 PM

## 2023-08-20 NOTE — TOC Progression Note (Signed)
Transition of Care [facility]) - Progression Note    Patient Details  Name: [name] MRN: [id number] Date of Birth: [birth year]  Transition of Care [facility]) CM/SW Contact  Lorri Frederick, LCSW Phone Number: 08/20/2023, 4:01 PM  Clinical Narrative:   CSW has sent email and text message requesting update to [name], APS.  Still awaiting response.     Expected Discharge Plan: Home w Home Health Services Barriers to Discharge: No Barriers Identified  Expected Discharge Plan and Services       Living arrangements for the past 2 months: Single Family Home                                       Social Determinants of Health (SDOH) Interventions SDOH Screenings   Depression (PHQ2-9): Low Risk  (03/11/2023)  Tobacco Use: Low Risk  (06/02/2023)    Readmission Risk Interventions     No data to display

## 2023-08-20 NOTE — Plan of Care (Signed)
  Problem: Education: Goal: Knowledge of General Education information will improve Description: Including pain rating scale, medication(s)/side effects and non-pharmacologic comfort measures Outcome: Progressing   Problem: Activity: Goal: Risk for activity intolerance will decrease Outcome: Progressing   Problem: Coping: Goal: Level of anxiety will decrease Outcome: Progressing   

## 2023-08-20 NOTE — Plan of Care (Signed)
  Problem: Education: Goal: Knowledge of General Education information will improve Description Including pain rating scale, medication(s)/side effects and non-pharmacologic comfort measures Outcome: Progressing   

## 2023-08-20 NOTE — Plan of Care (Signed)
CHL Tonsillectomy/Adenoidectomy, Postoperative PEDS care plan entered in error.

## 2023-08-20 NOTE — Progress Notes (Signed)
Progress Note   Patient: [name] EPP:[id number] DOB: 12-Dec-1965 DOA: 05/26/2023     86 DOS: the patient was seen and examined on 08/20/2023     Subjective    Seen and examined at bedside this morning Patient is Spanish speaking although understands little bit of English and requires Spanish interpreter  Denies nausea vomiting abdominal pain chest pain or cough     Brief Narrative / Interim history: 57 year old Male with past medical history significant for diabetes, TBI with residual MCI with aggressive behaviors and left hemiparesis, seizure disorder and depression was admitted for left foot gangrene with osteomyelitis and started on IV antibiotics. He underwent left TMA with wound VAC by Dr. Lajoyce Corners on 05/05/2023. Therapy recommended home health but patient has no safe disposition. Per sister's report to [facility], "he was staying with some people because he couldn't stay with sister anymore due to violent behaviors. Sister was bringing food and they were letting him stay for free. And now those people are saying he can't come back and sister can't take him either". TOC filed APS report. Now a placement issue. Wound VAC has been removed, suture removed 9/09.  Patient has been stable for discharge.      Principal problem Left foot cellulitis with gangrene and osteomyelitis - Vascular surgery consulted, suggested no indication for vascular intervention. S/p left TMA and wound VAC by Dr. Lajoyce Corners.  Wound healed.  WBAT.  Medically stable TOC manager working on placement   Active problems History of TBI/cognitive impairment and intermittent aggressive behaviors/depression - Psychiatry consulted appreciate recommendations,  unfortunately patient does not currently have capacity.  Continue with Risperdal, Prozac and hydroxyzine.   Controlled DM-2 - A1c 5.8% on 7/25.  CBG has been fairly controlled. Discontinued insulin  Continue glucose monitoring as needed   Unsafe living situation - [facility] on board as well as APS is involved   Anemia of chronic disease -monitor labs intermittently    History of seizures -continue with Keppra 500 mg twice daily.   Essential hypertension - Normotensive for most part.  Not on meds.   Hyponatremia - Resolved Monitor as needed   GERD -continue PPI therapy   Hyperlipidemia -continue statin therapy   DVT prophylaxis: Lovenox     Code Status: Full Code   Family Communication: no family at bedside    Status is: Inpatient Remains inpatient appropriate because: placement issues   Level of care: Med-Surg   Consultants:  Psychiatry Orthopedics   Physical examination Constitutional: Patrice Paradise male sitting in bed in no acute distress Eyes: PERTLA, lids and conjunctivae normal ENMT: Mucous membranes are moist.  Neck: supple, no masses  Respiratory: no wheezing, no crackles. No accessory muscle use.  Cardiovascular: S1 & S2 heard, regular rate and rhythm. No JVD. Abdomen: No distension, no tenderness Musculoskeletal: no clubbing / cyanosis.  Skin: Dressing noted to the left foot is clean and dry Neurologic: Alert and awake able to engage in conversation Psychiatric: Calm and cooperative     Data Reviewed: I have reviewed below mentioned labs as well as vitals, PT OT documentation as well as transition of care manager documentation   Time spent: 36 minutes      Latest Ref Rng & Units 08/18/2023    6:24 AM 08/12/2023    5:56 AM 08/04/2023    6:11 AM  CBC  WBC 4.0 - 10.5 K/uL 5.8  4.7  5.2   Hemoglobin 13.0 - 17.0 g/dL 60.6  30.1  60.1   Hematocrit  39.0 - 52.0 % 32.9  33.7  33.6   Platelets 150 - 400 K/uL 213  208  198        Latest Ref Rng & Units 08/18/2023    6:24 AM 08/12/2023    5:56 AM 08/04/2023    6:11 AM  BMP  Glucose 70 - 99 mg/dL 086  578  469   BUN 6 - 20 mg/dL 29  36  33   Creatinine 0.61 - 1.24 mg/dL 6.29  5.28  4.13   Sodium 135 - 145 mmol/L 136  138  137   Potassium 3.5 - 5.1 mmol/L 4.2  4.4  4.2    Chloride 98 - 111 mmol/L 101  102  105   CO2 22 - 32 mmol/L 27  27  26    Calcium 8.9 - 10.3 mg/dL 9.0  9.1  9.0      Vitals:   08/19/23 2026 08/20/23 0420 08/20/23 0421 08/20/23 0818  BP: 117/67  (!) 108/57 125/69  Pulse: 97 85 75 82  Resp: 18 18    Temp: 99.1 F (37.3 C) 98.5 F (36.9 C)  99.1 F (37.3 C)  TempSrc: Oral Oral  Oral  SpO2: 91% 95%  99%  Weight:        Author: Loyce Dys, MD 08/20/2023 12:33 PM  For on call review [email or web address].

## 2023-08-20 NOTE — Progress Notes (Signed)
Mobility Specialist: Progress Note   08/20/23 1518  Mobility  Activity Ambulated with assistance in hallway  Level of Assistance Standby assist, set-up cues, supervision of patient - no hands on  Assistive Device None  Distance Ambulated (ft) 565 ft  LLE Weight Bearing WBAT (with post op shoe)  Activity Response Tolerated well  Mobility Referral Yes  $Mobility charge 1 Mobility  Mobility Specialist Start Time (ACUTE ONLY) 1020  Mobility Specialist Stop Time (ACUTE ONLY) 1036  Mobility Specialist Time Calculation (min) (ACUTE ONLY) 16 min    Pt was agreeable to mobility session - received in bed. Interpreter Bonnye Fava was present and helpful throughout session. Required max verbal and tactile cues to take longer, slower steps. Able to follow directions under CG for about 3-4 steps before reverting back to shuffled, quick gait. CG when following directions on gait, SV otherwise. Returned to room without fault. Requested to ambulate to the BR as well. Left in bed with all needs met, call bell in reach. Bed alarm on.   [name] Mobility Specialist Please contact via SecureChat or Rehab office at [phone]

## 2023-08-21 NOTE — Progress Notes (Signed)
Progress Note   Patient: [name] DDU:[id number] DOB: 20-Jul-1966 DOA: 05/26/2023     87 DOS: the patient was seen and examined on 08/21/2023     Subjective  Patient seen. No new complaints. Awaiting disposition.   Brief Narrative / Interim history: Patient is a 57 year old male with past medical history significant for diabetes, TBI with residual MCI with aggressive behaviors and left hemiparesis, seizure disorder and depression.  Patient was admitted with left foot gangrene and osteomyelitis, and was started on IV antibiotics.  Patient underwent left TMA with wound VAC by Dr. Lajoyce Corners on 05/05/2023. Therapy recommended home health but patient has no safe disposition. Per sister's report to [facility], "he was staying with some people because he couldn't stay with sister anymore due to violent behaviors. Sister was bringing food and they were letting him stay for free. And now those people are saying he can't come back and sister can't take him either". TOC filed APS report. Now a placement issue. Wound VAC has been removed, suture removed 9/09.  Patient has been stable for discharge.    Principal problem Left foot cellulitis with gangrene and osteomyelitis: -Vascular surgery consulted, suggested no indication for vascular intervention.  -S/p left TMA and wound VAC by Dr. Lajoyce Corners.   -Wound healed.  WBAT.  Medically stable TOC manager working on placement   Active problems History of TBI/cognitive impairment and intermittent aggressive behaviors/depression - -Psychiatry consulted appreciate recommendations,  unfortunately patient does not currently have capacity.  Continue with Risperdal, Prozac and hydroxyzine.  Controlled DM-2: -A1c 5.8% on 7/25. -Discontinued insulin  -Continue glucose monitoring as needed   Unsafe living situation - [facility] on board as well as APS is involved   Anemia of chronic disease -monitor labs intermittently    History of seizures -continue with  Keppra 500 mg twice daily.   Essential hypertension - Normotensive for most part.  Not on meds.   Hyponatremia - Resolved Monitor as needed   GERD -continue PPI therapy   Hyperlipidemia -continue statin therapy   DVT prophylaxis: Lovenox     Code Status: Full Code   Family Communication: no family at bedside    Status is: Inpatient Remains inpatient appropriate because: placement issues   Level of care: Med-Surg   Consultants:  Psychiatry Orthopedics   Physical examination Constitutional: Not in any distress.  Awake and alert. HEENT: Patient is pale. Neck: Supple. Lungs: Clear to auscultation. CVS: S1-S2. Neuro: Awake and alert.    Data Reviewed: I have reviewed below mentioned labs as well as vitals, PT OT documentation as well as transition of care manager documentation   Time spent: 36 minutes      Latest Ref Rng & Units 08/18/2023    6:24 AM 08/12/2023    5:56 AM 08/04/2023    6:11 AM  CBC  WBC 4.0 - 10.5 K/uL 5.8  4.7  5.2   Hemoglobin 13.0 - 17.0 g/dL 23.7  62.8  31.5   Hematocrit 39.0 - 52.0 % 32.9  33.7  33.6   Platelets 150 - 400 K/uL 213  208  198        Latest Ref Rng & Units 08/18/2023    6:24 AM 08/12/2023    5:56 AM 08/04/2023    6:11 AM  BMP  Glucose 70 - 99 mg/dL 176  160  737   BUN 6 - 20 mg/dL 29  36  33   Creatinine 0.61 - 1.24 mg/dL 1.06  2.69  4.85  Sodium 135 - 145 mmol/L 136  138  137   Potassium 3.5 - 5.1 mmol/L 4.2  4.4  4.2   Chloride 98 - 111 mmol/L 101  102  105   CO2 22 - 32 mmol/L 27  27  26    Calcium 8.9 - 10.3 mg/dL 9.0  9.1  9.0      Vitals:   08/20/23 0818 08/20/23 1318 08/20/23 1913 08/21/23 0812  BP: 125/69 126/71 115/78 110/71  Pulse: 82 75 79 75  Resp:  16 18 18   Temp: 99.1 F (37.3 C) 98.1 F (36.7 C) 98.1 F (36.7 C) 97.9 F (36.6 C)  TempSrc: Oral Oral Oral   SpO2: 99% 97% 99% 96%  Weight:        Author: Barnetta Chapel, MD 08/21/2023 2:13 PM  For on call review [email or web address].

## 2023-08-21 NOTE — Progress Notes (Signed)
   08/21/23 1129  Mobility  Activity Ambulated with assistance in hallway  Level of Assistance Contact guard assist, steadying assist  Assistive Device None  Distance Ambulated (ft) 650 ft  LLE Weight Bearing WBAT (with Post Op Boot)  Activity Response Tolerated well  Mobility Referral Yes  $Mobility charge 1 Mobility  Mobility Specialist Start Time (ACUTE ONLY) 1000  Mobility Specialist Stop Time (ACUTE ONLY) 1012  Mobility Specialist Time Calculation (min) (ACUTE ONLY) 12 min   Mobility Specialist: Progress Note  Pt agreeable to mobility session - received in bed. Required CG with no AD. Pt was asymptomatic throughout session with no complaints. Required max verbal and tactile cues to take longer, slower steps. Able to follow directions under CG for about 3-4 steps before reverting back to shuffled, quick gait. Returned to bed with all needs met - call bell within reach. Bed Alarm On.   [name], BS Mobility Specialist Please contact via SecureChat or Rehab office at [phone].

## 2023-08-21 NOTE — Plan of Care (Signed)
  Problem: Nutrition: Goal: Adequate nutrition will be maintained Outcome: Progressing   

## 2023-08-22 NOTE — Progress Notes (Signed)
Progress Note   Patient: [name] [id number]:[id number] DOB: 1966-05-19 DOA: 05/26/2023     88 DOS: the patient was seen and examined on 08/22/2023     Subjective  Patient seen. No new complaints. Awaiting disposition.   Brief Narrative / Interim history: Patient is a 57 year old male with past medical history significant for diabetes, TBI with residual MCI with aggressive behaviors and left hemiparesis, seizure disorder and depression.  Patient was admitted with left foot gangrene and osteomyelitis, and was started on IV antibiotics.  Patient underwent left TMA with wound VAC by Dr. Lajoyce Corners on 05/05/2023. Therapy recommended home health but patient has no safe disposition. Per sister's report to [facility], "he was staying with some people because he couldn't stay with sister anymore due to violent behaviors. Sister was bringing food and they were letting him stay for free. And now those people are saying he can't come back and sister can't take him either". TOC filed APS report. Now a placement issue. Wound VAC has been removed, suture removed 9/09.  Patient has been stable for discharge.    Principal problem Left foot cellulitis with gangrene and osteomyelitis: -Vascular surgery consulted, suggested no indication for vascular intervention.  -S/p left TMA and wound VAC by Dr. Lajoyce Corners.   -Wound healed.  WBAT.  Medically stable TOC manager is working on placement   Active problems History of TBI/cognitive impairment and intermittent aggressive behaviors/depression - -Psychiatry consulted appreciate recommendations,  unfortunately patient does not currently have capacity.  Continue with Risperdal, Prozac and hydroxyzine.  Controlled DM-2: -A1c 5.8% on 7/25. -Discontinued insulin  -Continue glucose monitoring as needed   Unsafe living situation - [facility] on board as well as APS is involved   Anemia of chronic disease -monitor labs intermittently    History of seizures -continue with  Keppra 500 mg twice daily.   Essential hypertension - Normotensive for most part.  Not on meds.   Hyponatremia - Resolved Monitor as needed   GERD -continue PPI therapy   Hyperlipidemia -continue statin therapy   DVT prophylaxis: Lovenox     Code Status: Full Code   Family Communication: no family at bedside    Status is: Inpatient Remains inpatient appropriate because: placement issues   Level of care: Med-Surg   Consultants:  Psychiatry Orthopedics   Physical examination General condition: Patient is not in any distress.  Patient is awake and alert. HEENT: Mild pallor.  No jaundice. Neck: Supple. Lungs: Clear to auscultation. CVS: S1-S2 2, with systolic murmur. Abdomen: Soft and nontender.  Abdomen is obese. Neuro: Awake and alert. Extremities no leg edema.     Data Reviewed: I have reviewed below mentioned labs as well as vitals, PT OT documentation as well as transition of care manager documentation   Time spent: 36 minutes      Latest Ref Rng & Units 08/18/2023    6:24 AM 08/12/2023    5:56 AM 08/04/2023    6:11 AM  CBC  WBC 4.0 - 10.5 K/uL 5.8  4.7  5.2   Hemoglobin 13.0 - 17.0 g/dL 86.5  78.4  69.6   Hematocrit 39.0 - 52.0 % 32.9  33.7  33.6   Platelets 150 - 400 K/uL 213  208  198        Latest Ref Rng & Units 08/18/2023    6:24 AM 08/12/2023    5:56 AM 08/04/2023    6:11 AM  BMP  Glucose 70 - 99 mg/dL 295  284  109   BUN 6 - 20 mg/dL 29  36  33   Creatinine 0.61 - 1.24 mg/dL 2.72  5.36  6.44   Sodium 135 - 145 mmol/L 136  138  137   Potassium 3.5 - 5.1 mmol/L 4.2  4.4  4.2   Chloride 98 - 111 mmol/L 101  102  105   CO2 22 - 32 mmol/L 27  27  26    Calcium 8.9 - 10.3 mg/dL 9.0  9.1  9.0      Vitals:   08/21/23 2158 08/22/23 0407 08/22/23 0737 08/22/23 1536  BP: 112/65 (!) 92/57 106/71 111/70  Pulse: 71 61 73 77  Resp: 16 16 18 18   Temp: 97.7 F (36.5 C) 97.8 F (36.6 C) 98.4 F (36.9 C) 98.5 F (36.9 C)  TempSrc: Oral Oral Oral Oral   SpO2: 99% 94% 95% 100%  Weight:        Author: Barnetta Chapel, MD 08/22/2023 3:55 PM  For on call review [email or web address]

## 2023-08-22 NOTE — Progress Notes (Signed)
   08/22/23 1554  Mobility  Activity Ambulated with assistance in hallway  Level of Assistance Contact guard assist, steadying assist (SV STS)  Assistive Device None  Distance Ambulated (ft) 550 ft  LLE Weight Bearing WBAT (with Post Op Boot)  Mobility Referral Yes  $Mobility charge 1 Mobility  Mobility Specialist Start Time (ACUTE ONLY) 1540  Mobility Specialist Stop Time (ACUTE ONLY) 1552  Mobility Specialist Time Calculation (min) (ACUTE ONLY) 12 min   Mobility Specialist: Progress Note  Pt agreeable to mobility session - received in bed. Required SV for STS, CG with no AD. Pt was asymptomatic throughout session with no complaints. Returned to bed with all needs met - call bell within reach. bed alarm on.   Barnie Mort, BS Mobility Specialist Please contact via SecureChat or Rehab office at [phone].

## 2023-08-23 NOTE — [place] FL2 (Signed)
[place] MEDICAID FL2 LEVEL OF CARE FORM     IDENTIFICATION  Patient Name: [name] Birthdate: 27-Jun-1966 Sex: male Admission Date (Current Location): 05/26/2023  [facility] and [place] Number:  Producer, television/film/video and Address:  [facility] [place], [address] [place], [place] [address]      Provider Number: [id number]  Attending Physician Name and Address:  Kathrynn Running, MD  Relative Name and Phone Number:  Waylan Boga [phone]    Current Level of Care: Hospital Recommended Level of Care: Other (Comment) (group home) Prior Approval Number:    Date Approved/Denied:   PASRR Number:    Discharge Plan: Other (Comment) (group home)    Current Diagnoses: Patient Active Problem List   Diagnosis Date Noted   Cutaneous abscess of left foot 05/27/2023   PAD (peripheral artery disease) (HCC) 05/27/2023   Severe protein-calorie malnutrition (HCC) 05/27/2023   Cellulitis of left foot 05/26/2023   Burnout of caregiver 07/09/2022   Aggressive behavior of adult 07/09/2022   Gastric polyp    Gastritis and gastroduodenitis    History of traumatic brain injury 05/29/2021   MDD (major depressive disorder), recurrent, in partial remission (HCC) 11/12/2020   MDD (major depressive disorder), recurrent episode, moderate (HCC) 07/16/2020   Influenza vaccine needed 07/14/2019   Microalbuminuria due to type 2 diabetes mellitus (HCC) 04/11/2019   Hypothyroidism 09/21/2018   Seizure disorder (HCC) 06/16/2018   Low testosterone 06/16/2018   Anemia, chronic disease 10/29/2017   Positive depression screening 10/29/2017   Attention and concentration deficit    Type 2 diabetes mellitus with peripheral neuropathy (HCC)    S/P craniotomy 03/19/2017   Essential hypertension    Dysphagia, oropharyngeal 02/01/2017   Diabetes type 2, uncontrolled 06/13/2015    Orientation RESPIRATION BLADDER Height & Weight     Self, Time,  Place  Normal Continent Weight: 120 lb 5.9 oz (54.6 kg) Height:     BEHAVIORAL SYMPTOMS/MOOD NEUROLOGICAL BOWEL NUTRITION STATUS      Incontinent Diet (carb modified)  AMBULATORY STATUS COMMUNICATION OF NEEDS Skin   Supervision Verbally Normal                       Personal Care Assistance Level of Assistance  Bathing, Feeding, Dressing Bathing Assistance: Independent Feeding assistance: Independent Dressing Assistance: Independent     Functional Limitations Info  Sight, Hearing, Speech Sight Info: Adequate Hearing Info: Adequate Speech Info: Adequate    SPECIAL CARE FACTORS FREQUENCY  PT (By licensed PT)     PT Frequency: evaluate and treat: outpatient OT Frequency: 5x week            Contractures Contractures Info: Not present    Additional Factors Info  Code Status, Allergies Code Status Info: full Allergies Info: latex   Insulin Sliding Scale Info: novolog       Current Medications (08/23/2023):  This is the current hospital active medication list Current Facility-Administered Medications  Medication Dose Route Frequency Provider Last Rate Last Admin   acetaminophen (TYLENOL) tablet 650 mg  650 mg Oral Q6H PRN [name], MD   650 mg at 07/01/23 5643   Or   acetaminophen (TYLENOL) suppository 650 mg  650 mg Rectal Q6H PRN [name], MD       alum & mag hydroxide-simeth (MAALOX/MYLANTA) 200-200-20 MG/5ML suspension 15-30 mL  15-30 mL Oral Q2H PRN [name], MD       ascorbic acid (VITAMIN  C) tablet 1,000 mg  1,000 mg Oral Daily [name], MD   1,000 mg at 08/23/23 0817   atorvastatin (LIPITOR) tablet 10 mg  10 mg Oral Daily [name], MD   10 mg at 08/23/23 0816   bisacodyl (DULCOLAX) EC tablet 5 mg  5 mg Oral Daily PRN [name], MD       enoxaparin (LOVENOX) injection 40 mg  40 mg Subcutaneous Daily [name], MD   40 mg at 08/23/23 0815   feeding supplement (GLUCERNA SHAKE) (GLUCERNA SHAKE) liquid 237 mL  237 mL Oral  BID BM Kathlen Mody, MD   237 mL at 08/23/23 1345   FLUoxetine (PROZAC) capsule 30 mg  30 mg Oral Daily [name], MD   30 mg at 08/23/23 0816   levETIRAcetam (KEPPRA) tablet 500 mg  500 mg Oral BID [name], MD   500 mg at 08/23/23 8295   loperamide (IMODIUM) capsule 2 mg  2 mg Oral PRN Candelaria Stagers T, MD   2 mg at 06/10/23 1549   metFORMIN (GLUCOPHAGE) tablet 500 mg  500 mg Oral BID WC Candelaria Stagers T, MD   500 mg at 08/23/23 0816   multivitamins with iron tablet 1 tablet  1 tablet Oral Daily Almon Hercules, MD   1 tablet at 08/23/23 6213   nutrition supplement (JUVEN) (JUVEN) powder packet 1 packet  1 packet Oral BID BM [name], MD   1 packet at 08/23/23 1345   ondansetron (ZOFRAN) tablet 4 mg  4 mg Oral Q6H PRN [name], MD       Or   ondansetron [facility]) injection 4 mg  4 mg Intravenous Q6H PRN [name], MD       Oral care mouth rinse  15 mL Mouth Rinse PRN Leatha Gilding, MD       pantoprazole (PROTONIX) EC tablet 40 mg  40 mg Oral Daily [name], MD   40 mg at 08/23/23 0816   polyethylene glycol (MIRALAX / GLYCOLAX) packet 17 g  17 g Oral Daily PRN [name], MD   17 g at 07/30/23 0914   risperiDONE (RISPERDAL) tablet 0.25 mg  0.25 mg Oral QHS [name], MD   0.25 mg at 08/22/23 2143   sodium chloride flush (NS) 0.9 % injection 3 mL  3 mL Intravenous Q12H Leatha Gilding, MD         Discharge Medications: Please see discharge summary for a list of discharge medications.  Relevant Imaging Results:  Relevant Lab Results:   Additional Information SSN: [id number]  Lorri Frederick, LCSW

## 2023-08-23 NOTE — Progress Notes (Signed)
Mobility Specialist: Progress Note   08/23/23 1644  Mobility  Activity Ambulated with assistance in hallway  Level of Assistance Standby assist, set-up cues, supervision of patient - no hands on  Assistive Device None  Distance Ambulated (ft) 550 ft  LLE Weight Bearing WBAT  Activity Response Tolerated well  Mobility Referral Yes  $Mobility charge 1 Mobility  Mobility Specialist Start Time (ACUTE ONLY) 1123  Mobility Specialist Stop Time (ACUTE ONLY) 1132  Mobility Specialist Time Calculation (min) (ACUTE ONLY) 9 min    Pt was agreeable to mobility session - received in bed. Required max verbal and tactile cues to take longer, slower steps. Able to follow directions under CG for about 3-4 steps before reverting back to shuffled, quick gait. SV otherwise. Returned to room without fault. Left in bed with all needs met, call bell in reach. Bed alarm on.   [name] Mobility Specialist Please contact via SecureChat or Rehab office at [phone]

## 2023-08-23 NOTE — Plan of Care (Signed)
Problem: Education: Goal: Ability to describe self-care measures that may prevent or decrease complications (Diabetes Survival Skills Education) will improve Outcome: Not Progressing Goal: Individualized Educational Video(s) Outcome: Not Progressing   Problem: Coping: Goal: Ability to adjust to condition or change in health will improve Outcome: Not Progressing   Problem: Health Behavior/Discharge Planning: Goal: Ability to identify and utilize available resources and services will improve Outcome: Not Progressing Goal: Ability to manage health-related needs will improve Outcome: Not Progressing   Problem: Metabolic: Goal: Ability to maintain appropriate glucose levels will improve Outcome: Not Progressing   Problem: Nutritional: Goal: Progress toward achieving an optimal weight will improve Outcome: Not Progressing   Problem: Skin Integrity: Goal: Risk for impaired skin integrity will decrease Outcome: Not Progressing   Problem: Tissue Perfusion: Goal: Adequacy of tissue perfusion will improve Outcome: Not Progressing   Problem: Education: Goal: Knowledge of General Education information will improve Description: Including pain rating scale, medication(s)/side effects and non-pharmacologic comfort measures Outcome: Not Progressing   Problem: Health Behavior/Discharge Planning: Goal: Ability to manage health-related needs will improve Outcome: Not Progressing   Problem: Clinical Measurements: Goal: Ability to maintain clinical measurements within normal limits will improve Outcome: Not Progressing Goal: Will remain free from infection Outcome: Not Progressing Goal: Diagnostic test results will improve Outcome: Not Progressing Goal: Respiratory complications will improve Outcome: Not Progressing Goal: Cardiovascular complication will be avoided Outcome: Not Progressing   Problem: Activity: Goal: Risk for activity intolerance will decrease Outcome: Not  Progressing   Problem: Nutrition: Goal: Adequate nutrition will be maintained Outcome: Not Progressing   Problem: Coping: Goal: Level of anxiety will decrease Outcome: Not Progressing   Problem: Elimination: Goal: Will not experience complications related to bowel motility Outcome: Not Progressing Goal: Will not experience complications related to urinary retention Outcome: Not Progressing   Problem: Pain Managment: Goal: General experience of comfort will improve Outcome: Not Progressing   Problem: Safety: Goal: Ability to remain free from injury will improve Outcome: Not Progressing   Problem: Skin Integrity: Goal: Risk for impaired skin integrity will decrease Outcome: Not Progressing   Problem: Education: Goal: Knowledge of General Education information will improve Description: Including pain rating scale, medication(s)/side effects and non-pharmacologic comfort measures Outcome: Not Progressing   Problem: Health Behavior/Discharge Planning: Goal: Ability to manage health-related needs will improve Outcome: Not Progressing   Problem: Clinical Measurements: Goal: Ability to maintain clinical measurements within normal limits will improve Outcome: Not Progressing Goal: Will remain free from infection Outcome: Not Progressing Goal: Diagnostic test results will improve Outcome: Not Progressing Goal: Respiratory complications will improve Outcome: Not Progressing Goal: Cardiovascular complication will be avoided Outcome: Not Progressing   Problem: Activity: Goal: Risk for activity intolerance will decrease Outcome: Not Progressing   Problem: Nutrition: Goal: Adequate nutrition will be maintained Outcome: Not Progressing   Problem: Coping: Goal: Level of anxiety will decrease Outcome: Not Progressing   Problem: Elimination: Goal: Will not experience complications related to bowel motility Outcome: Not Progressing Goal: Will not experience complications  related to urinary retention Outcome: Not Progressing   Problem: Pain Managment: Goal: General experience of comfort will improve Outcome: Not Progressing   Problem: Safety: Goal: Ability to remain free from injury will improve Outcome: Not Progressing   Problem: Skin Integrity: Goal: Risk for impaired skin integrity will decrease Outcome: Not Progressing   Problem: Education: Goal: Knowledge of the prescribed therapeutic regimen will improve Outcome: Not Progressing Goal: Ability to verbalize activity precautions or restrictions will improve Outcome: Not Progressing Goal:  Understanding of discharge needs will improve Outcome: Not Progressing   Problem: Activity: Goal: Ability to perform//tolerate increased activity and mobilize with assistive devices will improve Outcome: Not Progressing

## 2023-08-23 NOTE — TOC Progression Note (Signed)
Transition of Care [facility]) - Progression Note    Patient Details  Name: [name] MRN: [id number] Date of Birth: [birth year]  [facility] CM/SW Contact  Lorri Frederick, LCSW Phone Number: 08/23/2023, 3:55 PM  Clinical Narrative:   CSW received email from Angela/[place] adult protective services, requesting updated FL2.  This was completed, now recommending group home placement, emailed to [facility] and her supervisor.      Expected Discharge Plan: Group Home Barriers to Discharge: Other (must enter comment) (Adult protective services is working on placement)  Expected Discharge Plan and Services       Living arrangements for the past 2 months: Single Family Home                                       Social Determinants of Health (SDOH) Interventions SDOH Screenings   Depression (PHQ2-9): Low Risk  (03/11/2023)  Tobacco Use: Low Risk  (06/02/2023)    Readmission Risk Interventions     No data to display

## 2023-08-23 NOTE — Progress Notes (Signed)
Progress Note   Patient: [name] UXN:[id number] DOB: 18-Sep-1966 DOA: 05/26/2023     89 DOS: the patient was seen and examined on 08/23/2023     Subjective  Patient seen. 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  Patient was admitted with left foot gangrene and osteomyelitis, and was started on IV antibiotics.  Patient underwent left TMA with wound VAC by Dr. Lajoyce Corners on 05/05/2023. Therapy recommended home health but patient has no safe disposition. Per sister's report to [facility], "he was staying with some people because he couldn't stay with sister anymore due to violent behaviors. Sister was bringing food and they were letting him stay for free. And now those people are saying he can't come back and sister can't take him either". TOC filed APS report. Now a placement issue. Wound VAC has been removed, suture removed 9/09.  Patient has been stable for discharge.    Principal problem Left foot cellulitis with gangrene and osteomyelitis: -Vascular surgery consulted, suggested no indication for vascular intervention.  -S/p left TMA and wound VAC by Dr. Lajoyce Corners.   -Wound healed.  WBAT.  Medically stable TOC manager is working on placement   Active problems History of TBI/cognitive impairment and intermittent aggressive behaviors/depression - -Psychiatry consulted appreciate recommendations,  unfortunately patient does not currently have capacity.  Continue with Risperdal, Prozac and hydroxyzine.  Controlled DM-2: -A1c 5.8% on 7/25. -Discontinued insulin  -Continue glucose monitoring as needed   Unsafe living situation - [facility] on board as well as APS is involved   Anemia of chronic disease -monitor labs intermittently    History of seizures  -continue with Keppra 500 mg twice daily.   Essential hypertension - Normotensive for most part.  Not on meds.   Hyponatremia - Resolved Monitor as needed   GERD -continue PPI therapy   Hyperlipidemia -continue statin therapy   DVT prophylaxis: Lovenox     Code Status: Full Code   Family Communication: no family at bedside    Status is: Inpatient Remains inpatient appropriate because: placement issues   Level of care: Med-Surg   Consultants:  Psychiatry Orthopedics   Physical examination General condition: Patient is not in any distress.  Patient is awake and alert. HEENT: atraumatic Lungs: Clear to auscultation. CVS: S1-S2 2, with mild systolic murmur. Abdomen: Soft and nontender.    Neuro: Awake and alert. Extremities no leg edema. S/p left TMA     Data Reviewed: I have reviewed below mentioned labs as well as vitals, PT OT documentation as well as transition of care manager documentation   Time spent: 36 minutes      Latest Ref Rng & Units 08/18/2023    6:24 AM 08/12/2023    5:56 AM 08/04/2023    6:11 AM  CBC  WBC 4.0 - 10.5 K/uL 5.8  4.7  5.2   Hemoglobin 13.0 - 17.0 g/dL 25.4  27.0  62.3   Hematocrit 39.0 - 52.0 % 32.9  33.7  33.6   Platelets 150 - 400 K/uL 213  208  198        Latest Ref Rng & Units 08/18/2023    6:24 AM 08/12/2023    5:56 AM 08/04/2023    6:11 AM  BMP  Glucose 70 - 99 mg/dL 762  831  109   BUN 6 - 20 mg/dL 29  36  33   Creatinine 0.61 - 1.24 mg/dL 6.57  8.46  9.62   Sodium 135 - 145 mmol/L 136  138  137   Potassium 3.5 - 5.1 mmol/L 4.2  4.4  4.2   Chloride 98 - 111 mmol/L 101  102  105   CO2 22 - 32 mmol/L 27  27  26    Calcium 8.9 - 10.3 mg/dL 9.0  9.1  9.0      Vitals:   08/22/23 1536 08/22/23 2147 08/23/23 0721 08/23/23 1404  BP: 111/70 114/66 104/70 125/68  Pulse: 77 75 79 73  Resp: 18 18 16 18   Temp: 98.5 F (36.9 C) 98.2 F (36.8 C) 98.8 F (37.1 C) 98 F (36.7 C)  TempSrc: Oral Oral Oral   SpO2: 100% 95% 95% 96%   Weight:        Author: Silvano Bilis, MD 08/23/2023 2:39 PM  For on call review [email or web address].

## 2023-08-24 NOTE — Plan of Care (Signed)
  Problem: Safety: Goal: Ability to remain free from injury will improve Outcome: Progressing   Problem: Skin Integrity: Goal: Risk for impaired skin integrity will decrease Outcome: Progressing   Problem: Education: Goal: Knowledge of the prescribed therapeutic regimen will improve Outcome: Progressing   Problem: Activity: Goal: Ability to perform//tolerate increased activity and mobilize with assistive devices will improve Outcome: Progressing

## 2023-08-24 NOTE — Progress Notes (Signed)
PROGRESS NOTE    [name]  NWG:[id number] DOB: 10/21/1966 DOA: 05/26/2023 PCP: Marcine Matar, MD    Brief Narrative:   [name] is a 57 y.o. 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 Patient was started on IV antibiotics and underwent left TMA with wound VAC by Dr. Lajoyce Corners on 05/05/2023. Therapy recommended home health but patient has no safe disposition. Per sister's report to [facility], "he was staying with some people because he couldn't stay with sister anymore due to violent behaviors. Sister was bringing food and they were letting him stay for free. And now those people are saying he can't come back and sister can't take him either". TOC filed APS report. Now a placement issue. Wound VAC has been removed, sutures removed 9/09.   Patient has been stable for discharge; has progressed well with therapy and now pending group home placement.   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  Vascular surgery was consulted and no indication for vascular intervention at this time.  Orthopedics was consulted and patient underwent left TMA and wound VAC placement by Dr. Lajoyce Corners on 05/05/2023.  Wound is now healed.  WBAT.   -- Continue therapy efforts while inpatient -- Stable for discharge but pending group home placement per TOC.  History of TBI/cognitive impairment and intermittent aggressive behaviors/depression Psychiatry consulted appreciate recommendations,  unfortunately patient does not currently have capacity.  -- Risperdal 0.25 mg p.o. nightly -- Prozac 30 mg p.o. daily   Type 2 diabetes mellitus Hemoglobin A1c 5.8% on 7/25. -- Metformin 500 mg p.o. twice daily   Unsafe  living situation  TOC following, APS involved --Pending group home placement per TOC   Anemia of chronic disease  Hemoglobin 11.6 on 10/16, stable.    History of seizures  -- Keppra 500 mg twice daily.   Essential hypertension  Remains normotensive off of antihypertensive medication.   Hyponatremia: Resolved Monitor as needed   GERD  -- Protonix 40 mg p.o. daily   Hyperlipidemia  -- Atorvastatin 10 mg.    DVT prophylaxis: SCD's Start: 06/02/23 1515 enoxaparin (LOVENOX) injection 40 mg Start: 05/27/23 1000    Code Status: Full Code Family Communication: No family present at bedside this morning  Disposition Plan:  Level of care: Med-Surg Status is: Inpatient Remains inpatient appropriate because: Medically stable for discharge once safe disposition found, pending group home placement per [facility]    Consultants:  Vascular surgery Orthopedics, Dr. Due to  Procedures:  Left TMA 05/05/2023, Dr. Lajoyce Corners  Antimicrobials:  Ceftriaxone 7/24 - 7/30 Vancomycin 7/26 - 7/30 Metronidazole 7/30 - 7/30   Subjective: Patient seen examined bedside, resting comfortably.  Lying in bed.  No specific complaints this morning.  Denies headache, no chest pain, no shortness of breath, no abdominal pain.  Working with therapy well.  Awaiting group home placement.  No acute events overnight per nursing.  Medically stable for discharge once safe disposition found by TOC, pending group home.  Objective: Vitals:   08/23/23 0721 08/23/23 1404 08/23/23 2225 08/24/23 0840  BP: 104/70 125/68 122/73 113/64  Pulse: 79 73 66 72  Resp: 16 18 17 18   Temp: 98.8 F (37.1 C) 98 F (36.7 C) 98.2 F (36.8 C) 98 F (36.7 C)  TempSrc: Oral  Oral Oral  SpO2: 95% 96% 98% 90%  Weight:        Intake/Output Summary (Last 24 hours) at 08/24/2023 1245 Last data filed at 08/24/2023 1610 Gross per 24 hour  Intake 1080 ml  Output 400 ml  Net 680 ml   Filed Weights   05/26/23 2321  Weight: 54.6 kg     Examination:  Physical Exam: GEN: NAD, alert and oriented x 3, wd/wn HEENT: NCAT, PERRL, EOMI, sclera clear, MMM PULM: CTAB w/o wheezes/crackles, normal respiratory effort, on room air CV: RRR w/o M/G/R GI: abd soft, NTND, NABS MSK: no peripheral edema, none the left TMA, moves all extremities independently NEURO: CN II-XII intact, no focal deficits, sensation to light touch intact PSYCH: normal mood/affect Integumentary: Left foot TMA site as above, otherwise no other concerning rashes/lesions/wounds noted exposed and services     Data Reviewed: I have personally reviewed following labs and imaging studies  CBC: Recent Labs  Lab 08/18/23 0624  WBC 5.8  HGB 11.6*  HCT 32.9*  MCV 84.6  PLT 213   Basic Metabolic Panel: Recent Labs  Lab 08/18/23 0624  NA 136  K 4.2  CL 101  CO2 27  GLUCOSE 109*  BUN 29*  CREATININE 1.04  CALCIUM 9.0  MG 1.8   GFR: Estimated Creatinine Clearance: 59.8 mL/min (by C-G formula based on SCr of 1.04 mg/dL). Liver Function Tests: Recent Labs  Lab 08/18/23 0624  AST 29  ALT 57*  ALKPHOS 50  BILITOT 0.3  PROT 6.5  ALBUMIN 3.2*   No results for input(s): "LIPASE", "AMYLASE" in the last 168 hours. No results for input(s): "AMMONIA" in the last 168 hours. Coagulation Profile: No results for input(s): "INR", "PROTIME" in the last 168 hours. Cardiac Enzymes: No results for input(s): "CKTOTAL", "CKMB", "CKMBINDEX", "TROPONINI" in the last 168 hours. BNP (last 3 results) No results for input(s): "PROBNP" in the last 8760 hours. HbA1C: No results for input(s): "HGBA1C" in the last 72 hours. CBG: No results for input(s): "GLUCAP" in the last 168 hours. Lipid Profile: No results for input(s): "CHOL", "HDL", "LDLCALC", "TRIG", "CHOLHDL", "LDLDIRECT" in the last 72 hours. Thyroid Function Tests: No results for input(s): "TSH", "T4TOTAL", "FREET4", "T3FREE", "THYROIDAB" in the last 72 hours. Anemia Panel: No results for input(s):  "VITAMINB12", "FOLATE", "FERRITIN", "TIBC", "IRON", "RETICCTPCT" in the last 72 hours. Sepsis Labs: No results for input(s): "PROCALCITON", "LATICACIDVEN" in the last 168 hours.  No results found for this or any previous visit (from the past 240 hour(s)).       Radiology Studies: No results found.      Scheduled Meds:  vitamin C  1,000 mg Oral Daily   atorvastatin  10 mg Oral Daily   enoxaparin (LOVENOX) injection  40 mg Subcutaneous Daily   feeding supplement (GLUCERNA SHAKE)  237 mL Oral BID BM   FLUoxetine  30 mg Oral Daily   levETIRAcetam  500 mg Oral BID   metFORMIN  500 mg Oral BID WC   multivitamins with iron  1 tablet Oral Daily   nutrition supplement (JUVEN)  1 packet Oral BID BM   pantoprazole  40 mg Oral Daily   risperiDONE  0.25 mg Oral QHS   sodium chloride flush  3 mL Intravenous Q12H   Continuous Infusions:   LOS: 90 days    Time spent: 50 minutes spent on chart review, discussion with nursing staff, consultants, updating family and interview/physical exam; more than 50% of that time was spent  in counseling and/or coordination of care.    Alvira Philips [place], DO [facility] Available via Epic secure chat 7am-7pm After these hours, please refer to coverage provider listed on [email or web address] 08/24/2023, 12:45 PM

## 2023-08-24 NOTE — Plan of Care (Signed)
Problem: Education: Goal: Ability to describe self-care measures that may prevent or decrease complications (Diabetes Survival Skills Education) will improve Outcome: Not Progressing Goal: Individualized Educational Video(s) Outcome: Not Progressing   Problem: Coping: Goal: Ability to adjust to condition or change in health will improve Outcome: Not Progressing   Problem: Health Behavior/Discharge Planning: Goal: Ability to identify and utilize available resources and services will improve Outcome: Not Progressing Goal: Ability to manage health-related needs will improve Outcome: Not Progressing   Problem: Metabolic: Goal: Ability to maintain appropriate glucose levels will improve Outcome: Not Progressing   Problem: Nutritional: Goal: Progress toward achieving an optimal weight will improve Outcome: Not Progressing   Problem: Skin Integrity: Goal: Risk for impaired skin integrity will decrease Outcome: Not Progressing   Problem: Tissue Perfusion: Goal: Adequacy of tissue perfusion will improve Outcome: Not Progressing   Problem: Education: Goal: Knowledge of General Education information will improve Description: Including pain rating scale, medication(s)/side effects and non-pharmacologic comfort measures Outcome: Not Progressing   Problem: Health Behavior/Discharge Planning: Goal: Ability to manage health-related needs will improve Outcome: Not Progressing   Problem: Clinical Measurements: Goal: Ability to maintain clinical measurements within normal limits will improve Outcome: Not Progressing Goal: Will remain free from infection Outcome: Not Progressing Goal: Diagnostic test results will improve Outcome: Not Progressing Goal: Respiratory complications will improve Outcome: Not Progressing Goal: Cardiovascular complication will be avoided Outcome: Not Progressing   Problem: Activity: Goal: Risk for activity intolerance will decrease Outcome: Not  Progressing   Problem: Nutrition: Goal: Adequate nutrition will be maintained Outcome: Not Progressing   Problem: Coping: Goal: Level of anxiety will decrease Outcome: Not Progressing   Problem: Elimination: Goal: Will not experience complications related to bowel motility Outcome: Not Progressing Goal: Will not experience complications related to urinary retention Outcome: Not Progressing   Problem: Pain Managment: Goal: General experience of comfort will improve Outcome: Not Progressing   Problem: Safety: Goal: Ability to remain free from injury will improve Outcome: Not Progressing   Problem: Skin Integrity: Goal: Risk for impaired skin integrity will decrease Outcome: Not Progressing   Problem: Education: Goal: Knowledge of General Education information will improve Description: Including pain rating scale, medication(s)/side effects and non-pharmacologic comfort measures Outcome: Not Progressing   Problem: Health Behavior/Discharge Planning: Goal: Ability to manage health-related needs will improve Outcome: Not Progressing   Problem: Clinical Measurements: Goal: Ability to maintain clinical measurements within normal limits will improve Outcome: Not Progressing Goal: Will remain free from infection Outcome: Not Progressing Goal: Diagnostic test results will improve Outcome: Not Progressing Goal: Respiratory complications will improve Outcome: Not Progressing Goal: Cardiovascular complication will be avoided Outcome: Not Progressing   Problem: Activity: Goal: Risk for activity intolerance will decrease Outcome: Not Progressing   Problem: Nutrition: Goal: Adequate nutrition will be maintained Outcome: Not Progressing   Problem: Coping: Goal: Level of anxiety will decrease Outcome: Not Progressing   Problem: Elimination: Goal: Will not experience complications related to bowel motility Outcome: Not Progressing Goal: Will not experience complications  related to urinary retention Outcome: Not Progressing   Problem: Pain Managment: Goal: General experience of comfort will improve Outcome: Not Progressing   Problem: Safety: Goal: Ability to remain free from injury will improve Outcome: Not Progressing   Problem: Skin Integrity: Goal: Risk for impaired skin integrity will decrease Outcome: Not Progressing   Problem: Education: Goal: Knowledge of the prescribed therapeutic regimen will improve Outcome: Not Progressing Goal: Ability to verbalize activity precautions or restrictions will improve Outcome: Not Progressing Goal:  Understanding of discharge needs will improve Outcome: Not Progressing   Problem: Activity: Goal: Ability to perform//tolerate increased activity and mobilize with assistive devices will improve Outcome: Not Progressing

## 2023-08-24 NOTE — Progress Notes (Signed)
Mobility Specialist: Progress Note   08/24/23 1442  Mobility  Activity Ambulated with assistance in hallway  Level of Assistance Standby assist, set-up cues, supervision of patient - no hands on  Assistive Device None  Distance Ambulated (ft) 550 ft  LLE Weight Bearing WBAT  Activity Response Tolerated well  Mobility Referral Yes  $Mobility charge 1 Mobility  Mobility Specialist Start Time (ACUTE ONLY) 1341  Mobility Specialist Stop Time (ACUTE ONLY) 1350  Mobility Specialist Time Calculation (min) (ACUTE ONLY) 9 min    Pt was agreeable to mobility session - received in chair. Required max verbal and tactile cues to take longer, slower steps. Able to follow directions under CG for about 3-4 steps before reverting back to shuffled, quick gait. SV otherwise. Returned to room without fault. Left in chair with all needs met, call bell in reach.    [name] Mobility Specialist Please contact via SecureChat or Rehab office at [phone]

## 2023-08-25 NOTE — Progress Notes (Signed)
Physical Therapy Treatment Patient Details Name: [name] MRN: [id number] DOB: [birth year] Today's Date: 08/25/2023   History of Present Illness Pt is a 57 year old male admitted for evaluation of being unable to get up and ambulate, and not eating for 2 days, admitted for left foot gangrene with osteomyelitis. 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    PT Comments  Utilized house interpreter [name] throughout session. [name] now has both of his sneakers and is able to don them both in bed prior to getting up. Pt mobilizing at a supervision level in hallway. His stability is improved with bilateral shoes. Practiced turning to sit without reaching out for surface. Pt requires cuing to complete 2/5 times practiced. Able to find his way back to his room despite having moved this week. Will continue to work to progress mobility and stability, but continues to need a venue with near constant supervision.    If plan is discharge home, recommend the following: A little help with walking and/or transfers;A little help with bathing/dressing/bathroom;Assistance with cooking/housework;Direct supervision/assist for medications management;Assist for transportation;Help with stairs or ramp for entrance   Can travel by private vehicle     No  Equipment Recommendations   (will continue to discern)    Recommendations for Other Services       Precautions / Restrictions Precautions Precautions: Fall;Other (comment) Precaution Comments: History of TBI with left side weakness and cognitive impairments Restrictions Weight Bearing Restrictions: No LLE Weight Bearing: Weight bearing as tolerated     Mobility  Bed Mobility Overal bed mobility: Needs Assistance, Modified Independent Bed Mobility: Supine to Sit       Sit to supine: Modified independent (Device/Increase time)   General bed mobility  comments: able to don bilateral shoes while in bed and then come to EoB with minor use of bed rail    Transfers Overall transfer level: Needs assistance Equipment used: None Transfers: Sit to/from Stand Sit to Stand: Supervision           General transfer comment: steadies using very large BoS    Ambulation/Gait Ambulation/Gait assistance: Supervision Gait Distance (Feet): 620 Feet Assistive device: None Gait Pattern/deviations: Shuffle, Staggering left, Staggering right, Wide base of support Gait velocity: sometimes to fast for conditions Gait velocity interpretation: <1.8 ft/sec, indicate of risk for recurrent falls   General Gait Details: pt with both of his sneakers on today, and while he continues to have a very wide BoS he has more fluidity with his ambulation and better reciprocal arm and leg motion          Balance Overall balance assessment: Needs assistance Sitting-balance support: No upper extremity supported, Feet supported Sitting balance-Leahy Scale: Normal     Standing balance support: Single extremity supported, During functional activity Standing balance-Leahy Scale: Fair Standing balance comment: looks for support but is able to stabilize with both static and dynamic balance at a contact guard level                            Cognition Arousal: Alert Behavior During Therapy: Impulsive Overall Cognitive Status: History of cognitive impairments - at baseline                                 General Comments: hx of cognitive impairment, impulsive, requires cues for safety and  to ask for assistance. needs repeated cues to maintain corrections. very poor STM in regards to multi-step command following and application of instructions to functional task        Exercises Other Exercises Other Exercises: STS x 10 on different surfaces throughout unit focus on turning and sitting with some carryover    General Comments General  comments (skin integrity, edema, etc.): VSS on RA      Pertinent Vitals/Pain Pain Assessment Pain Assessment: No/denies pain Faces Pain Scale: No hurt     PT Goals (current goals can now be found in the care plan section) Acute Rehab PT Goals Patient Stated Goal: to walk around PT Goal Formulation: With patient Time For Goal Achievement: 08/03/23 Potential to Achieve Goals: Good Progress towards PT goals: Progressing toward goals    Frequency    Min 1X/week      PT Plan Current plan remains appropriate       AM-PAC PT "6 Clicks" Mobility   Outcome Measure  Help needed turning from your back to your side while in a flat bed without using bedrails?: None Help needed moving from lying on your back to sitting on the side of a flat bed without using bedrails?: None Help needed moving to and from a bed to a chair (including a wheelchair)?: A Little Help needed standing up from a chair using your arms (e.g., wheelchair or bedside chair)?: A Little Help needed to walk in hospital room?: A Little Help needed climbing 3-5 steps with a railing? : A Little 6 Click Score: 20    End of Session Equipment Utilized During Treatment: Gait belt Activity Tolerance: Patient tolerated treatment well Patient left: with call bell/phone within reach;in chair;with chair alarm set;with family/visitor present Nurse Communication: Mobility status PT Visit Diagnosis: Other abnormalities of gait and mobility (R26.89)     Time: 6295-2841 PT Time Calculation (min) (ACUTE ONLY): 23 min  Charges:    $Gait Training: 8-22 mins $Therapeutic Activity: 8-22 mins PT General Charges $$ ACUTE PT VISIT: 1 Visit                     Vonna Brabson B. Beverely Risen PT, DPT Acute Rehabilitation Services Please use secure chat or  Call Office [phone]    Elon Alas [facility] 08/25/2023, 2:39 PM

## 2023-08-25 NOTE — Plan of Care (Signed)
  Problem: Education: Goal: Knowledge of General Education information will improve Description: Including pain rating scale, medication(s)/side effects and non-pharmacologic comfort measures Outcome: Adequate for Discharge   Problem: Activity: Goal: Risk for activity intolerance will decrease Outcome: Adequate for Discharge   Problem: Nutrition: Goal: Adequate nutrition will be maintained Outcome: Adequate for Discharge   Problem: Coping: Goal: Level of anxiety will decrease Outcome: Adequate for Discharge   Problem: Elimination: Goal: Will not experience complications related to bowel motility Outcome: Adequate for Discharge   Problem: Pain Managment: Goal: General experience of comfort will improve Outcome: Adequate for Discharge   Problem: Safety: Goal: Ability to remain free from injury will improve Outcome: Adequate for Discharge

## 2023-08-25 NOTE — Progress Notes (Signed)
PROGRESS NOTE    [name]  ZOX:[id number] DOB: May 25, 1966 DOA: 05/26/2023 PCP: Marcine Matar, MD    Brief Narrative:   [name] is a 57 y.o. 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 Patient was started on IV antibiotics and underwent left TMA with wound VAC by Dr. Lajoyce Corners on 05/05/2023. Therapy recommended home health but patient has no safe disposition. Per sister's report to [facility], "he was staying with some people because he couldn't stay with sister anymore due to violent behaviors. Sister was bringing food and they were letting him stay for free. And now those people are saying he can't come back and sister can't take him either". TOC filed APS report. Now a placement issue. Wound VAC has been removed, sutures removed 9/09.   Patient has been stable for discharge; has progressed well with therapy and now pending group home placement.   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  Vascular surgery was consulted and no indication for vascular intervention at this time.  Orthopedics was consulted and patient underwent left TMA and wound VAC placement by Dr. Lajoyce Corners on 05/05/2023.  Wound is now healed.  WBAT.   -- Continue therapy efforts while inpatient -- Stable for discharge but pending group home placement per TOC.  History of TBI/cognitive impairment and intermittent aggressive behaviors/depression Psychiatry consulted appreciate recommendations,  unfortunately patient does not currently have capacity.  -- Risperdal 0.25 mg p.o. nightly -- Prozac 30 mg p.o. daily   Type 2 diabetes mellitus Hemoglobin A1c 5.8% on 7/25. -- Metformin 500 mg p.o. twice daily   Unsafe  living situation  TOC following, APS involved -- Pending group home placement per TOC   Anemia of chronic disease  Hemoglobin 11.6 on 10/16, stable.    History of seizures  -- Keppra 500 mg twice daily.   Essential hypertension  Remains normotensive off of antihypertensive medication.   Hyponatremia: Resolved Monitor as needed   GERD  -- Protonix 40 mg p.o. daily   Hyperlipidemia  -- Atorvastatin 10 mg.    DVT prophylaxis: SCD's Start: 06/02/23 1515 enoxaparin (LOVENOX) injection 40 mg Start: 05/27/23 1000    Code Status: Full Code Family Communication: No family present at bedside this morning  Disposition Plan:  Level of care: Med-Surg Status is: Inpatient Remains inpatient appropriate because: Medically stable for discharge once safe disposition found, pending group home placement per [facility]    Consultants:  Vascular surgery Orthopedics, Dr. Due to  Procedures:  Left TMA 05/05/2023, Dr. Lajoyce Corners  Antimicrobials:  Ceftriaxone 7/24 - 7/30 Vancomycin 7/26 - 7/30 Metronidazole 7/30 - 7/30   Subjective: Patient seen examined bedside, resting comfortably.  Lying in bed.  Watching TV.  No specific complaints this morning.  Denies headache, no chest pain, no shortness of breath, no abdominal pain.  Working with therapy well.  Awaiting group home placement.  No acute events overnight per nursing.  Medically stable for discharge once safe disposition found by TOC, pending group home.  TC reports that [place] also now involved in disposition planning.  Objective: Vitals:   08/24/23 0840 08/24/23 1426 08/24/23 2308 08/25/23 0718  BP: 113/64 118/69 111/62 123/74  Pulse: 72 73 70 80  Resp: 18 18 16    Temp:  98 F (36.7 C) 98.2 F (36.8 C) 98.9 F (37.2 C) 98.3 F (36.8 C)  TempSrc: Oral Oral Oral Oral  SpO2: 90% 100% 100% 95%  Weight:        Intake/Output Summary (Last 24 hours) at 08/25/2023 1323 Last data filed at 08/25/2023 0900 Gross per 24 hour   Intake 720 ml  Output 400 ml  Net 320 ml   Filed Weights   05/26/23 2321  Weight: 54.6 kg    Examination:  Physical Exam: GEN: NAD, alert and oriented x 3, wd/wn HEENT: NCAT, PERRL, EOMI, sclera clear, MMM PULM: CTAB w/o wheezes/crackles, normal respiratory effort, on room air CV: RRR w/o M/G/R GI: abd soft, NTND, NABS MSK: no peripheral edema, none the left TMA, moves all extremities independently NEURO: CN II-XII intact, no focal deficits, sensation to light touch intact PSYCH: normal mood/affect Integumentary: Left foot TMA site as above, otherwise no other concerning rashes/lesions/wounds noted exposed and services     Data Reviewed: I have personally reviewed following labs and imaging studies  CBC: No results for input(s): "WBC", "NEUTROABS", "HGB", "HCT", "MCV", "PLT" in the last 168 hours.  Basic Metabolic Panel: No results for input(s): "NA", "K", "CL", "CO2", "GLUCOSE", "BUN", "CREATININE", "CALCIUM", "MG", "PHOS" in the last 168 hours.  GFR: Estimated Creatinine Clearance: 59.8 mL/min (by C-G formula based on SCr of 1.04 mg/dL). Liver Function Tests: No results for input(s): "AST", "ALT", "ALKPHOS", "BILITOT", "PROT", "ALBUMIN" in the last 168 hours.  No results for input(s): "LIPASE", "AMYLASE" in the last 168 hours. No results for input(s): "AMMONIA" in the last 168 hours. Coagulation Profile: No results for input(s): "INR", "PROTIME" in the last 168 hours. Cardiac Enzymes: No results for input(s): "CKTOTAL", "CKMB", "CKMBINDEX", "TROPONINI" in the last 168 hours. BNP (last 3 results) No results for input(s): "PROBNP" in the last 8760 hours. HbA1C: No results for input(s): "HGBA1C" in the last 72 hours. CBG: No results for input(s): "GLUCAP" in the last 168 hours. Lipid Profile: No results for input(s): "CHOL", "HDL", "LDLCALC", "TRIG", "CHOLHDL", "LDLDIRECT" in the last 72 hours. Thyroid Function Tests: No results for input(s): "TSH", "T4TOTAL",  "FREET4", "T3FREE", "THYROIDAB" in the last 72 hours. Anemia Panel: No results for input(s): "VITAMINB12", "FOLATE", "FERRITIN", "TIBC", "IRON", "RETICCTPCT" in the last 72 hours. Sepsis Labs: No results for input(s): "PROCALCITON", "LATICACIDVEN" in the last 168 hours.  No results found for this or any previous visit (from the past 240 hour(s)).       Radiology Studies: No results found.      Scheduled Meds:  vitamin C  1,000 mg Oral Daily   atorvastatin  10 mg Oral Daily   enoxaparin (LOVENOX) injection  40 mg Subcutaneous Daily   feeding supplement (GLUCERNA SHAKE)  237 mL Oral BID BM   FLUoxetine  30 mg Oral Daily   levETIRAcetam  500 mg Oral BID   metFORMIN  500 mg Oral BID WC   multivitamins with iron  1 tablet Oral Daily   nutrition supplement (JUVEN)  1 packet Oral BID BM   pantoprazole  40 mg Oral Daily   risperiDONE  0.25 mg Oral QHS   sodium chloride flush  3 mL Intravenous Q12H   Continuous Infusions:   LOS: 91 days    Time spent: 46 minutes spent on chart review, discussion with nursing staff, consultants, updating family and interview/physical exam; more than 50% of that time was spent in counseling and/or coordination of care.    Alvira Philips [place], DO [facility]  Available via Epic secure chat 7am-7pm After these hours, please refer to coverage provider listed on [email or web address] 08/25/2023, 1:23 PM

## 2023-08-25 NOTE — TOC Progression Note (Signed)
Transition of Care [facility]) - Progression Note    Patient Details  Name: [name] MRN: [id number] Date of Birth: Jan 19, 1966  [facility] CM/SW Contact  Lorri Frederick, LCSW Phone Number: 08/25/2023, 3:18 PM  Clinical Narrative:   CSW received message from [facility] hospice regarding referral for pt.  CSW message with Angela/APS who confirmed that she made this referral.  They are pursuing admission to [facility] and hospice is a potential support for this admission.  Marylene Land also reports that they are ultimately pursuing permanent placement in [place] and are working with [place] consulate on this.  Placement at [facility] would be an intermediate step.  CSW spoke with Misty/[facility].  There are several questions as to what would qualify him for hospice services and who his decision maker is.  She will have MD at hospice evaluate and get back to CSW.  She is aware that this is not a comfort care situation.      Expected Discharge Plan: Group Home Barriers to Discharge: Other (must enter comment) (Adult protective services is working on placement)  Expected Discharge Plan and Services       Living arrangements for the past 2 months: Single Family Home                                       Social Determinants of Health (SDOH) Interventions SDOH Screenings   Depression (PHQ2-9): Low Risk  (03/11/2023)  Tobacco Use: Low Risk  (06/02/2023)    Readmission Risk Interventions     No data to display

## 2023-08-25 NOTE — Progress Notes (Signed)
Mobility Specialist: Progress Note   08/25/23 1124  Mobility  Activity Ambulated with assistance in hallway  Level of Assistance Standby assist, set-up cues, supervision of patient - no hands on  Assistive Device None  Distance Ambulated (ft) 550 ft  LLE Weight Bearing WBAT  Activity Response Tolerated well  Mobility Referral Yes  $Mobility charge 1 Mobility  Mobility Specialist Start Time (ACUTE ONLY) 1028  Mobility Specialist Stop Time (ACUTE ONLY) 1040  Mobility Specialist Time Calculation (min) (ACUTE ONLY) 12 min    Pt was agreeable to mobility session - received in bed. Interpreter Bonnye Fava was present and helpful throughout session. Retention improved since yesterday, able to recall and correct gait to taking slower, longer steps multiple times throughout session without verbal or tactile cues from MS. Pt memory improved from yesterday, able to recall MS and medical interpreter's name without clues. Still needing a few verbal cues to slow down gait but much less than previous sessions. Returned to room without fault. Left in bed with all needs met, call bell in reach. Bed alarm on.   [name] Mobility Specialist Please contact via SecureChat or Rehab office at [phone]

## 2023-08-25 NOTE — Plan of Care (Signed)
Problem: Education: Goal: Ability to describe self-care measures that may prevent or decrease complications (Diabetes Survival Skills Education) will improve Outcome: Not Progressing Goal: Individualized Educational Video(s) Outcome: Not Progressing   Problem: Coping: Goal: Ability to adjust to condition or change in health will improve Outcome: Not Progressing   Problem: Health Behavior/Discharge Planning: Goal: Ability to identify and utilize available resources and services will improve Outcome: Not Progressing Goal: Ability to manage health-related needs will improve Outcome: Not Progressing   Problem: Metabolic: Goal: Ability to maintain appropriate glucose levels will improve Outcome: Not Progressing   Problem: Nutritional: Goal: Progress toward achieving an optimal weight will improve Outcome: Not Progressing   Problem: Skin Integrity: Goal: Risk for impaired skin integrity will decrease Outcome: Not Progressing   Problem: Tissue Perfusion: Goal: Adequacy of tissue perfusion will improve Outcome: Not Progressing   Problem: Education: Goal: Knowledge of General Education information will improve Description: Including pain rating scale, medication(s)/side effects and non-pharmacologic comfort measures Outcome: Not Progressing   Problem: Health Behavior/Discharge Planning: Goal: Ability to manage health-related needs will improve Outcome: Not Progressing   Problem: Clinical Measurements: Goal: Ability to maintain clinical measurements within normal limits will improve Outcome: Not Progressing Goal: Will remain free from infection Outcome: Not Progressing Goal: Diagnostic test results will improve Outcome: Not Progressing Goal: Respiratory complications will improve Outcome: Not Progressing Goal: Cardiovascular complication will be avoided Outcome: Not Progressing   Problem: Activity: Goal: Risk for activity intolerance will decrease Outcome: Not  Progressing   Problem: Nutrition: Goal: Adequate nutrition will be maintained Outcome: Not Progressing   Problem: Coping: Goal: Level of anxiety will decrease Outcome: Not Progressing   Problem: Elimination: Goal: Will not experience complications related to bowel motility Outcome: Not Progressing Goal: Will not experience complications related to urinary retention Outcome: Not Progressing   Problem: Pain Managment: Goal: General experience of comfort will improve Outcome: Not Progressing   Problem: Safety: Goal: Ability to remain free from injury will improve Outcome: Not Progressing   Problem: Skin Integrity: Goal: Risk for impaired skin integrity will decrease Outcome: Not Progressing   Problem: Education: Goal: Knowledge of General Education information will improve Description: Including pain rating scale, medication(s)/side effects and non-pharmacologic comfort measures Outcome: Not Progressing   Problem: Health Behavior/Discharge Planning: Goal: Ability to manage health-related needs will improve Outcome: Not Progressing   Problem: Clinical Measurements: Goal: Ability to maintain clinical measurements within normal limits will improve Outcome: Not Progressing Goal: Will remain free from infection Outcome: Not Progressing Goal: Diagnostic test results will improve Outcome: Not Progressing Goal: Respiratory complications will improve Outcome: Not Progressing Goal: Cardiovascular complication will be avoided Outcome: Not Progressing   Problem: Activity: Goal: Risk for activity intolerance will decrease Outcome: Not Progressing   Problem: Nutrition: Goal: Adequate nutrition will be maintained Outcome: Not Progressing   Problem: Coping: Goal: Level of anxiety will decrease Outcome: Not Progressing   Problem: Elimination: Goal: Will not experience complications related to bowel motility Outcome: Not Progressing Goal: Will not experience complications  related to urinary retention Outcome: Not Progressing   Problem: Pain Managment: Goal: General experience of comfort will improve Outcome: Not Progressing   Problem: Safety: Goal: Ability to remain free from injury will improve Outcome: Not Progressing   Problem: Skin Integrity: Goal: Risk for impaired skin integrity will decrease Outcome: Not Progressing   Problem: Education: Goal: Knowledge of the prescribed therapeutic regimen will improve Outcome: Not Progressing Goal: Ability to verbalize activity precautions or restrictions will improve Outcome: Not Progressing Goal:  Understanding of discharge needs will improve Outcome: Not Progressing   Problem: Activity: Goal: Ability to perform//tolerate increased activity and mobilize with assistive devices will improve Outcome: Not Progressing

## 2023-08-26 ENCOUNTER — Ambulatory Visit: Payer: Self-pay | Admitting: Neurology

## 2023-08-26 NOTE — Progress Notes (Signed)
PROGRESS NOTE    [name]  UJW:[id number] DOB: 09-19-1966 DOA: 05/26/2023 PCP: Marcine Matar, MD    Brief Narrative:   [name] is a 57 y.o. 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 Patient was started on IV antibiotics and underwent left TMA with wound VAC by Dr. Lajoyce Corners on 05/05/2023. Therapy recommended home health but patient has no safe disposition. Per sister's report to [facility], "he was staying with some people because he couldn't stay with sister anymore due to violent behaviors. Sister was bringing food and they were letting him stay for free. And now those people are saying he can't come back and sister can't take him either". TOC filed APS report. Now a placement issue. Wound VAC has been removed, sutures removed 9/09.   Patient has been stable for discharge; has progressed well with therapy and now pending group home placement.   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  Vascular surgery was consulted and no indication for vascular intervention at this time.  Orthopedics was consulted and patient underwent left TMA and wound VAC placement by Dr. Lajoyce Corners on 05/05/2023.  Wound is now healed.  WBAT.   -- Continue therapy efforts while inpatient -- Stable for discharge but pending group home placement per TOC.  History of TBI/cognitive impairment and intermittent aggressive behaviors/depression Psychiatry consulted appreciate recommendations,  unfortunately patient does not currently have capacity.  -- Risperdal 0.25 mg p.o. nightly -- Prozac 30 mg p.o. daily   Type 2 diabetes mellitus Hemoglobin A1c 5.8% on 7/25. -- Metformin 500 mg p.o. twice daily   Unsafe  living situation  TOC following, APS involved -- Pending group home placement per TOC   Anemia of chronic disease  Hemoglobin 11.6 on 10/16, stable.    History of seizures  -- Keppra 500 mg twice daily.   Essential hypertension  Remains normotensive off of antihypertensive medication.   Hyponatremia: Resolved Monitor as needed   GERD  -- Protonix 40 mg p.o. daily   Hyperlipidemia  -- Atorvastatin 10 mg.    DVT prophylaxis: SCD's Start: 06/02/23 1515 enoxaparin (LOVENOX) injection 40 mg Start: 05/27/23 1000    Code Status: Full Code Family Communication: No family present at bedside this morning  Disposition Plan:  Level of care: Med-Surg Status is: Inpatient Remains inpatient appropriate because: Medically stable for discharge once safe disposition found, pending group home placement per [facility]    Consultants:  Vascular surgery Orthopedics, Dr. Due to  Procedures:  Left TMA 05/05/2023, Dr. Lajoyce Corners  Antimicrobials:  Ceftriaxone 7/24 - 7/30 Vancomycin 7/26 - 7/30 Metronidazole 7/30 - 7/30   Subjective: Patient seen examined bedside, resting comfortably.  Lying in bed.  Watching TV.  No specific complaints this morning.  Denies headache, no chest pain, no shortness of breath, no abdominal pain.  Working with therapy well.  Awaiting group home placement.  No acute events overnight per nursing.  Medically stable for discharge once safe disposition found by TOC, pending group home.  TOC reports that [place] also now involved in disposition planning.  Objective: Vitals:   08/25/23 1426 08/25/23 2029 08/26/23 0420 08/26/23 0807  BP: 104/65 98/62 (!) 91/54 116/71  Pulse: 72 81 60 75  Resp:   16 16  Temp: 97.9 F (36.6 C) 98.1 F (36.7 C) 98.5 F (36.9 C) 98.2 F (36.8 C)  TempSrc: Oral Oral Oral Oral  SpO2: 98% 98% 97% 99%  Weight:        Intake/Output Summary (Last 24 hours) at 08/26/2023 1205 Last data filed at 08/26/2023 0850 Gross per 24 hour   Intake --  Output 650 ml  Net -650 ml   Filed Weights   05/26/23 2321  Weight: 54.6 kg    Examination:  Physical Exam: GEN: NAD, alert and oriented x 3, wd/wn HEENT: NCAT, PERRL, EOMI, sclera clear, MMM PULM: CTAB w/o wheezes/crackles, normal respiratory effort, on room air CV: RRR w/o M/G/R GI: abd soft, NTND, NABS MSK: no peripheral edema, none the left TMA, moves all extremities independently NEURO: CN II-XII intact, no focal deficits, sensation to light touch intact PSYCH: normal mood/affect Integumentary: Left foot TMA site as above, otherwise no other concerning rashes/lesions/wounds noted exposed and services     Data Reviewed: I have personally reviewed following labs and imaging studies  CBC: No results for input(s): "WBC", "NEUTROABS", "HGB", "HCT", "MCV", "PLT" in the last 168 hours.  Basic Metabolic Panel: No results for input(s): "NA", "K", "CL", "CO2", "GLUCOSE", "BUN", "CREATININE", "CALCIUM", "MG", "PHOS" in the last 168 hours.  GFR: Estimated Creatinine Clearance: 59.8 mL/min (by C-G formula based on SCr of 1.04 mg/dL). Liver Function Tests: No results for input(s): "AST", "ALT", "ALKPHOS", "BILITOT", "PROT", "ALBUMIN" in the last 168 hours.  No results for input(s): "LIPASE", "AMYLASE" in the last 168 hours. No results for input(s): "AMMONIA" in the last 168 hours. Coagulation Profile: No results for input(s): "INR", "PROTIME" in the last 168 hours. Cardiac Enzymes: No results for input(s): "CKTOTAL", "CKMB", "CKMBINDEX", "TROPONINI" in the last 168 hours. BNP (last 3 results) No results for input(s): "PROBNP" in the last 8760 hours. HbA1C: No results for input(s): "HGBA1C" in the last 72 hours. CBG: No results for input(s): "GLUCAP" in the last 168 hours. Lipid Profile: No results for input(s): "CHOL", "HDL", "LDLCALC", "TRIG", "CHOLHDL", "LDLDIRECT" in the last 72 hours. Thyroid Function Tests: No results for input(s): "TSH", "T4TOTAL",  "FREET4", "T3FREE", "THYROIDAB" in the last 72 hours. Anemia Panel: No results for input(s): "VITAMINB12", "FOLATE", "FERRITIN", "TIBC", "IRON", "RETICCTPCT" in the last 72 hours. Sepsis Labs: No results for input(s): "PROCALCITON", "LATICACIDVEN" in the last 168 hours.  No results found for this or any previous visit (from the past 240 hour(s)).       Radiology Studies: No results found.      Scheduled Meds:  vitamin C  1,000 mg Oral Daily   atorvastatin  10 mg Oral Daily   enoxaparin (LOVENOX) injection  40 mg Subcutaneous Daily   feeding supplement (GLUCERNA SHAKE)  237 mL Oral BID BM   FLUoxetine  30 mg Oral Daily   levETIRAcetam  500 mg Oral BID   metFORMIN  500 mg Oral BID WC   multivitamins with iron  1 tablet Oral Daily   nutrition supplement (JUVEN)  1 packet Oral BID BM   pantoprazole  40 mg Oral Daily   risperiDONE  0.25 mg Oral QHS   sodium chloride flush  3 mL Intravenous Q12H   Continuous Infusions:   LOS: 92 days    Time spent: 46 minutes spent on chart review, discussion with nursing staff, consultants, updating family and interview/physical exam; more than 50% of that time was spent in counseling and/or coordination of care.    Alvira Philips [place], DO [facility]  Available via Epic secure chat 7am-7pm After these hours, please refer to coverage provider listed on [email or web address] 08/26/2023, 12:05 PM

## 2023-08-26 NOTE — Plan of Care (Signed)
  Problem: Education: Goal: Ability to describe self-care measures that may prevent or decrease complications (Diabetes Survival Skills Education) will improve Outcome: Progressing   Problem: Coping: Goal: Ability to adjust to condition or change in health will improve Outcome: Progressing   Problem: Health Behavior/Discharge Planning: Goal: Ability to identify and utilize available resources and services will improve Outcome: Progressing   Problem: Metabolic: Goal: Ability to maintain appropriate glucose levels will improve Outcome: Progressing   Problem: Nutritional: Goal: Progress toward achieving an optimal weight will improve Outcome: Progressing   Problem: Skin Integrity: Goal: Risk for impaired skin integrity will decrease Outcome: Progressing   Problem: Tissue Perfusion: Goal: Adequacy of tissue perfusion will improve Outcome: Progressing

## 2023-08-26 NOTE — Progress Notes (Signed)
Mobility Specialist: Progress Note   08/26/23 1201  Mobility  Activity Ambulated with assistance in hallway  Level of Assistance Standby assist, set-up cues, supervision of patient - no hands on  Assistive Device None  Distance Ambulated (ft) 550 ft  LLE Weight Bearing WBAT  Activity Response Tolerated well  Mobility Referral Yes  $Mobility charge 1 Mobility  Mobility Specialist Start Time (ACUTE ONLY) 1117  Mobility Specialist Stop Time (ACUTE ONLY) 1128  Mobility Specialist Time Calculation (min) (ACUTE ONLY) 11 min    Pt was agreeable to mobility session - received in bed. Interpreter Bonnye Fava was present and helpful throughout session. Don/doff both tennis shoes. Retention consistent with yesterday's session, pt able to recall and correct gait to taking slower, longer steps multiple times throughout session without many verbal or tactile cues from MS. Pt memory also consistent to yesterday, able to recall MS and medical interpreter's names with minimal clues. Still needing a few verbal cues to slow down gait but much less than previous sessions. SV throughout even while follow directions. Returned to room without fault. Left in bed with all needs met, call bell in reach. Bed alarm on.   Prefers regular tennis shoe now as he stated it helps him step better.   [name] Mobility Specialist Please contact via SecureChat or Rehab office at [phone]

## 2023-08-27 NOTE — Progress Notes (Signed)
PROGRESS NOTE    [name]  [id number]:[id number] DOB: 02/27/1966 DOA: 05/26/2023 PCP: Marcine Matar, MD    Brief Narrative:   [name] is a 57 y.o. 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 Patient was started on IV antibiotics and underwent left TMA with wound VAC by Dr. Lajoyce Corners on 05/05/2023. Therapy recommended home health but patient has no safe disposition. Per sister's report to [facility], "he was staying with some people because he couldn't stay with sister anymore due to violent behaviors. Sister was bringing food and they were letting him stay for free. And now those people are saying he can't come back and sister can't take him either". TOC filed APS report. Now a placement issue. Wound VAC has been removed, sutures removed 9/09.   Patient has been stable for discharge; has progressed well with therapy and now pending group home placement.   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  Vascular surgery was consulted and no indication for vascular intervention at this time.  Orthopedics was consulted and patient underwent left TMA and wound VAC placement by Dr. Lajoyce Corners on 05/05/2023.  Wound is now healed.  WBAT.   -- Continue therapy efforts while inpatient -- Stable for discharge but pending group home placement per TOC.  History of TBI/cognitive impairment and intermittent aggressive behaviors/depression Psychiatry consulted appreciate recommendations,  unfortunately patient does not currently have capacity.  -- Risperdal 0.25 mg p.o. nightly -- Prozac 30 mg p.o. daily   Type 2 diabetes mellitus Hemoglobin A1c 5.8% on 7/25. -- Metformin 500 mg p.o. twice daily   Unsafe  living situation  TOC following, APS involved -- Pending group home placement per TOC   Anemia of chronic disease  Hemoglobin 11.6 on 10/16, stable.    History of seizures  -- Keppra 500 mg twice daily.   Essential hypertension  Remains normotensive off of antihypertensive medication.   Hyponatremia: Resolved Monitor as needed   GERD  -- Protonix 40 mg p.o. daily   Hyperlipidemia  -- Atorvastatin 10 mg.    DVT prophylaxis: SCD's Start: 06/02/23 1515 enoxaparin (LOVENOX) injection 40 mg Start: 05/27/23 1000    Code Status: Full Code Family Communication: No family present at bedside this morning  Disposition Plan:  Level of care: Med-Surg Status is: Inpatient Remains inpatient appropriate because: Medically stable for discharge once safe disposition found, pending group home placement per [facility]    Consultants:  Vascular surgery Orthopedics, Dr. Due to  Procedures:  Left TMA 05/05/2023, Dr. Lajoyce Corners  Antimicrobials:  Ceftriaxone 7/24 - 7/30 Vancomycin 7/26 - 7/30 Metronidazole 7/30 - 7/30   Subjective: Patient seen examined bedside, resting comfortably.  Lying in bed.  Watching TV.  No specific complaints this morning.  Denies headache, no chest pain, no shortness of breath, no abdominal pain.  Working with therapy well.  Awaiting group home placement.  No acute events overnight per nursing.  Medically stable for discharge once safe disposition found by TOC, pending group home.  TOC reports that [place] also now involved in disposition planning.  Objective: Vitals:   08/26/23 0420 08/26/23 0807 08/26/23 1353 08/27/23 0724  BP: (!) 91/54 116/71 121/80 125/81  Pulse: 60 75 72 82  Resp: 16 16 16  17  Temp: 98.5 F (36.9 C) 98.2 F (36.8 C) 97.8 F (36.6 C) 97.6 F (36.4 C)  TempSrc: Oral Oral  Oral  SpO2: 97% 99% 92% 97%  Weight:        Intake/Output Summary (Last 24 hours) at 08/27/2023 1106 Last data filed at 08/27/2023 0720 Gross per 24 hour   Intake 600 ml  Output 400 ml  Net 200 ml   Filed Weights   05/26/23 2321  Weight: 54.6 kg    Examination:  Physical Exam: GEN: NAD, alert and oriented x 3, wd/wn HEENT: NCAT, PERRL, EOMI, sclera clear, MMM PULM: CTAB w/o wheezes/crackles, normal respiratory effort, on room air CV: RRR w/o M/G/R GI: abd soft, NTND, NABS MSK: no peripheral edema, none the left TMA, moves all extremities independently NEURO: CN II-XII intact, no focal deficits, sensation to light touch intact PSYCH: normal mood/affect Integumentary: Left foot TMA site as above, otherwise no other concerning rashes/lesions/wounds noted exposed and services     Data Reviewed: I have personally reviewed following labs and imaging studies  CBC: No results for input(s): "WBC", "NEUTROABS", "HGB", "HCT", "MCV", "PLT" in the last 168 hours.  Basic Metabolic Panel: No results for input(s): "NA", "K", "CL", "CO2", "GLUCOSE", "BUN", "CREATININE", "CALCIUM", "MG", "PHOS" in the last 168 hours.  GFR: Estimated Creatinine Clearance: 59.8 mL/min (by C-G formula based on SCr of 1.04 mg/dL). Liver Function Tests: No results for input(s): "AST", "ALT", "ALKPHOS", "BILITOT", "PROT", "ALBUMIN" in the last 168 hours.  No results for input(s): "LIPASE", "AMYLASE" in the last 168 hours. No results for input(s): "AMMONIA" in the last 168 hours. Coagulation Profile: No results for input(s): "INR", "PROTIME" in the last 168 hours. Cardiac Enzymes: No results for input(s): "CKTOTAL", "CKMB", "CKMBINDEX", "TROPONINI" in the last 168 hours. BNP (last 3 results) No results for input(s): "PROBNP" in the last 8760 hours. HbA1C: No results for input(s): "HGBA1C" in the last 72 hours. CBG: No results for input(s): "GLUCAP" in the last 168 hours. Lipid Profile: No results for input(s): "CHOL", "HDL", "LDLCALC", "TRIG", "CHOLHDL", "LDLDIRECT" in the last 72 hours. Thyroid Function Tests: No results for input(s): "TSH", "T4TOTAL",  "FREET4", "T3FREE", "THYROIDAB" in the last 72 hours. Anemia Panel: No results for input(s): "VITAMINB12", "FOLATE", "FERRITIN", "TIBC", "IRON", "RETICCTPCT" in the last 72 hours. Sepsis Labs: No results for input(s): "PROCALCITON", "LATICACIDVEN" in the last 168 hours.  No results found for this or any previous visit (from the past 240 hour(s)).       Radiology Studies: No results found.      Scheduled Meds:  vitamin C  1,000 mg Oral Daily   atorvastatin  10 mg Oral Daily   enoxaparin (LOVENOX) injection  40 mg Subcutaneous Daily   feeding supplement (GLUCERNA SHAKE)  237 mL Oral BID BM   FLUoxetine  30 mg Oral Daily   levETIRAcetam  500 mg Oral BID   metFORMIN  500 mg Oral BID WC   multivitamins with iron  1 tablet Oral Daily   nutrition supplement (JUVEN)  1 packet Oral BID BM   pantoprazole  40 mg Oral Daily   risperiDONE  0.25 mg Oral QHS   sodium chloride flush  3 mL Intravenous Q12H   Continuous Infusions:   LOS: 93 days    Time spent: 46 minutes spent on chart review, discussion with nursing staff, consultants, updating family and interview/physical exam; more than 50% of that time was spent in counseling and/or coordination of care.    Alvira Philips [place], DO [facility] Available via Epic secure chat 7am-7pm After these hours, please refer to coverage provider listed on [email or web address] 08/27/2023, 11:06 AM

## 2023-08-27 NOTE — Plan of Care (Signed)
  Problem: Health Behavior/Discharge Planning: Goal: Ability to manage health-related needs will improve Outcome: Progressing   

## 2023-08-27 NOTE — Plan of Care (Signed)
  Problem: Education: Goal: Ability to describe self-care measures that may prevent or decrease complications (Diabetes Survival Skills Education) will improve Outcome: Progressing   

## 2023-08-27 NOTE — Progress Notes (Signed)
Physical Therapy Treatment Patient Details Name: [name] MRN: [id number] DOB: Jan 10, 1966 Today's Date: 08/27/2023   History of Present Illness Pt is a 57 year old male admitted for evaluation of being unable to get up and ambulate, and not eating for 2 days, admitted for left foot gangrene with osteomyelitis. 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    PT Comments  The pt was agreeable to session, able to demo good progress with ambulation this session, but continues to have significant difficulty with dual tasking, memory, and dynamic balance. With dynamic reaching task while walking, pt with no overt LOB, but wide BOS, slowed steps, and at times stopping all movement to complete reaching without LOB. Pt continues making slow but steady progress, continues to need supervision for all cognitive tasks due to deficits.     If plan is discharge home, recommend the following: A little help with walking and/or transfers;A little help with bathing/dressing/bathroom;Assistance with cooking/housework;Direct supervision/assist for medications management;Assist for transportation;Help with stairs or ramp for entrance   Can travel by private vehicle     No  Equipment Recommendations  None recommended by PT    Recommendations for Other Services       Precautions / Restrictions Precautions Precautions: Fall;Other (comment) Precaution Comments: History of TBI with left side weakness and cognitive impairments Restrictions Weight Bearing Restrictions: No LLE Weight Bearing: Weight bearing as tolerated     Mobility  Bed Mobility Overal bed mobility: Needs Assistance, Modified Independent Bed Mobility: Supine to Sit, Sit to Supine     Supine to sit: Supervision Sit to supine: Supervision   General bed mobility comments: supervision for safety, no assist to move. pt donning shoes in bed then  transitioning to sitting EOB    Transfers Overall transfer level: Needs assistance Equipment used: None Transfers: Sit to/from Stand Sit to Stand: Supervision           General transfer comment: steadies using very large BoS    Ambulation/Gait Ambulation/Gait assistance: Supervision Gait Distance (Feet): 500 Feet Assistive device: None Gait Pattern/deviations: Shuffle, Staggering left, Staggering right, Wide base of support Gait velocity: sometimes to fast for conditions     General Gait Details: pt with both of his sneakers on today, and while he continues to have a very wide BoS he has more fluidity with his ambulation and better reciprocal arm and leg motion. focus on dynamic balance task with gait, no overt LOB but pt at times staggering or stopping to complete task   Stairs             Wheelchair Mobility     Tilt Bed    Modified Rankin (Stroke Patients Only)       Balance Overall balance assessment: Needs assistance Sitting-balance support: No upper extremity supported, Feet supported Sitting balance-Leahy Scale: Normal     Standing balance support: Single extremity supported, During functional activity Standing balance-Leahy Scale: Fair Standing balance comment: looks for support but is able to stabilize with both static and dynamic balance at a contact guard level             High level balance activites: Other (comment) High Level Balance Comments: passing water bottle back and forth laterally initially then on diagonal while walking. at times pt staggering or stopping to complete reach towards R side without losing balance            Cognition Arousal: Alert Behavior During Therapy:  Impulsive Overall Cognitive Status: History of cognitive impairments - at baseline                                 General Comments: hx of cognitive impairment, impulsive, requires cues for safety and to ask for assistance. needs repeated  cues to maintain corrections. very poor STM in regards to multi-step command following and application of instructions to functional task        Exercises Other Exercises Other Exercises: lateral and diagonal reaching while ambulating to challenge dual task and dynamic balance. completed x10 laterally in both directions then diagonally from high-low and low-high x10    General Comments        Pertinent Vitals/Pain Pain Assessment Pain Assessment: Faces Pain Score: 0-No pain Faces Pain Scale: No hurt Pain Intervention(s): Monitored during session     PT Goals (current goals can now be found in the care plan section) Acute Rehab PT Goals Patient Stated Goal: to walk around PT Goal Formulation: With patient Time For Goal Achievement: 09/10/23 Potential to Achieve Goals: Good Progress towards PT goals: Progressing toward goals    Frequency    Min 1X/week      PT Plan Current plan remains appropriate       AM-PAC PT "6 Clicks" Mobility   Outcome Measure  Help needed turning from your back to your side while in a flat bed without using bedrails?: None Help needed moving from lying on your back to sitting on the side of a flat bed without using bedrails?: None Help needed moving to and from a bed to a chair (including a wheelchair)?: A Little Help needed standing up from a chair using your arms (e.g., wheelchair or bedside chair)?: A Little Help needed to walk in hospital room?: A Little Help needed climbing 3-5 steps with a railing? : A Little 6 Click Score: 20    End of Session Equipment Utilized During Treatment: Gait belt Activity Tolerance: Patient tolerated treatment well Patient left: with call bell/phone within reach;in chair;with chair alarm set;with family/visitor present Nurse Communication: Mobility status PT Visit Diagnosis: Other abnormalities of gait and mobility (R26.89)     Time: 1610-9604 PT Time Calculation (min) (ACUTE ONLY): 16  min  Charges:    $Neuromuscular Re-education: 8-22 mins PT General Charges $$ ACUTE PT VISIT: 1 Visit                     Vickki Muff, PT, DPT   Acute Rehabilitation Department Office [phone] Secure Chat Communication Preferred   Ronnie Derby 08/27/2023, 3:20 PM

## 2023-08-27 NOTE — Progress Notes (Signed)
   08/27/23 0800  Mobility  Activity Ambulated with assistance to bathroom  Level of Assistance Standby assist, set-up cues, supervision of patient - no hands on (CG STS)  Assistive Device None  Distance Ambulated (ft) 700 ft  LLE Weight Bearing WBAT (with Post Op Boot)  Activity Response Tolerated well  Mobility Referral Yes  $Mobility charge 1 Mobility  Mobility Specialist Start Time (ACUTE ONLY) 0840  Mobility Specialist Stop Time (ACUTE ONLY) 0850  Mobility Specialist Time Calculation (min) (ACUTE ONLY) 10 min   Mobility Specialist: Progress Note  Pt agreeable to mobility session - received in bed. Required CG from standing to sitting, SV for ambulation with no AD. Verbal cues given to slow gait. Pt with no complaints. Returned to bed with all needs met - call bell within reach.    Barnie Mort, BS Mobility Specialist Please contact via SecureChat or Rehab office at [phone].

## 2023-08-28 NOTE — Plan of Care (Signed)
  Problem: Coping: Goal: Ability to adjust to condition or change in health will improve Outcome: Progressing   

## 2023-08-28 NOTE — Plan of Care (Signed)
Problem: Education: Goal: Ability to describe self-care measures that may prevent or decrease complications (Diabetes Survival Skills Education) will improve Outcome: Progressing Goal: Individualized Educational Video(s) Outcome: Progressing   Problem: Coping: Goal: Ability to adjust to condition or change in health will improve Outcome: Progressing   Problem: Health Behavior/Discharge Planning: Goal: Ability to identify and utilize available resources and services will improve Outcome: Progressing Goal: Ability to manage health-related needs will improve Outcome: Progressing   Problem: Metabolic: Goal: Ability to maintain appropriate glucose levels will improve Outcome: Progressing   Problem: Nutritional: Goal: Progress toward achieving an optimal weight will improve Outcome: Progressing   Problem: Skin Integrity: Goal: Risk for impaired skin integrity will decrease Outcome: Progressing   Problem: Tissue Perfusion: Goal: Adequacy of tissue perfusion will improve Outcome: Progressing   Problem: Education: Goal: Knowledge of General Education information will improve Description: Including pain rating scale, medication(s)/side effects and non-pharmacologic comfort measures Outcome: Progressing   Problem: Health Behavior/Discharge Planning: Goal: Ability to manage health-related needs will improve Outcome: Progressing   Problem: Clinical Measurements: Goal: Ability to maintain clinical measurements within normal limits will improve Outcome: Progressing Goal: Will remain free from infection Outcome: Progressing Goal: Diagnostic test results will improve Outcome: Progressing Goal: Respiratory complications will improve Outcome: Progressing Goal: Cardiovascular complication will be avoided Outcome: Progressing   Problem: Activity: Goal: Risk for activity intolerance will decrease Outcome: Progressing   Problem: Nutrition: Goal: Adequate nutrition will be  maintained Outcome: Progressing   Problem: Coping: Goal: Level of anxiety will decrease Outcome: Progressing   Problem: Elimination: Goal: Will not experience complications related to bowel motility Outcome: Progressing Goal: Will not experience complications related to urinary retention Outcome: Progressing   Problem: Pain Managment: Goal: General experience of comfort will improve Outcome: Progressing   Problem: Safety: Goal: Ability to remain free from injury will improve Outcome: Progressing   Problem: Skin Integrity: Goal: Risk for impaired skin integrity will decrease Outcome: Progressing   Problem: Education: Goal: Knowledge of General Education information will improve Description: Including pain rating scale, medication(s)/side effects and non-pharmacologic comfort measures Outcome: Progressing   Problem: Health Behavior/Discharge Planning: Goal: Ability to manage health-related needs will improve Outcome: Progressing   Problem: Clinical Measurements: Goal: Ability to maintain clinical measurements within normal limits will improve Outcome: Progressing Goal: Will remain free from infection Outcome: Progressing Goal: Diagnostic test results will improve Outcome: Progressing Goal: Respiratory complications will improve Outcome: Progressing Goal: Cardiovascular complication will be avoided Outcome: Progressing   Problem: Activity: Goal: Risk for activity intolerance will decrease Outcome: Progressing   Problem: Nutrition: Goal: Adequate nutrition will be maintained Outcome: Progressing   Problem: Coping: Goal: Level of anxiety will decrease Outcome: Progressing   Problem: Elimination: Goal: Will not experience complications related to bowel motility Outcome: Progressing Goal: Will not experience complications related to urinary retention Outcome: Progressing   Problem: Pain Managment: Goal: General experience of comfort will improve Outcome:  Progressing   Problem: Safety: Goal: Ability to remain free from injury will improve Outcome: Progressing   Problem: Skin Integrity: Goal: Risk for impaired skin integrity will decrease Outcome: Progressing   Problem: Education: Goal: Knowledge of the prescribed therapeutic regimen will improve Outcome: Progressing Goal: Ability to verbalize activity precautions or restrictions will improve Outcome: Progressing Goal: Understanding of discharge needs will improve Outcome: Progressing   Problem: Activity: Goal: Ability to perform//tolerate increased activity and mobilize with assistive devices will improve Outcome: Progressing   Problem: Clinical Measurements: Goal: Postoperative complications will be avoided or minimized Outcome:  Progressing   Problem: Self-Care: Goal: Ability to meet self-care needs will improve Outcome: Progressing

## 2023-08-28 NOTE — Progress Notes (Signed)
Mobility Specialist: Progress Note   08/28/23 1134  Mobility  Activity Ambulated with assistance in hallway  Level of Assistance Standby assist, set-up cues, supervision of patient - no hands on  Assistive Device None  Distance Ambulated (ft) 550 ft  LLE Weight Bearing WBAT  Activity Response Tolerated well  Mobility Referral Yes  $Mobility charge 1 Mobility  Mobility Specialist Start Time (ACUTE ONLY) 1104  Mobility Specialist Stop Time (ACUTE ONLY) 1111  Mobility Specialist Time Calculation (min) (ACUTE ONLY) 7 min    Pt was agreeable - received in bed. Interpreter Darrelyn Hillock was present and helpful throughout session. Don/doff both tennis shoes. Pt required max verbal and tactile cues to recall and correct gait to taking slower, longer steps. SV throughout even while following directions. Returned to room without fault. Left in bed with all needs met, call bell in reach. Bed alarm on.    [name] Mobility Specialist Please contact via SecureChat or Rehab office at [phone]

## 2023-08-28 NOTE — Progress Notes (Signed)
PROGRESS NOTE    [name]  GMW:[id number] DOB: [birth year] DOA: 05/26/2023 PCP: Marcine Matar, MD    Brief Narrative:   [name] is a 57 y.o. 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 Patient was started on IV antibiotics and underwent left TMA with wound VAC by Dr. Lajoyce Corners on 05/05/2023. Therapy recommended home health but patient has no safe disposition. Per sister's report to [facility], "he was staying with some people because he couldn't stay with sister anymore due to violent behaviors. Sister was bringing food and they were letting him stay for free. And now those people are saying he can't come back and sister can't take him either". TOC filed APS report. Now a placement issue. Wound VAC has been removed, sutures removed 9/09.   Patient has been stable for discharge; has progressed well with therapy and now pending group home placement.   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  Vascular surgery was consulted and no indication for vascular intervention at this time.  Orthopedics was consulted and patient underwent left TMA and wound VAC placement by Dr. Lajoyce Corners on 05/05/2023.  Wound is now healed.  WBAT.   -- Continue therapy efforts while inpatient -- Stable for discharge but pending group home placement per TOC.  History of TBI/cognitive impairment and intermittent aggressive behaviors/depression Psychiatry consulted appreciate recommendations,  unfortunately patient does not currently have capacity.  -- Risperdal 0.25 mg p.o. nightly -- Prozac 30 mg p.o. daily   Type 2 diabetes mellitus Hemoglobin A1c 5.8% on 7/25. -- Metformin 500 mg p.o. twice daily   Unsafe  living situation  TOC following, APS involved -- Pending group home placement per TOC   Anemia of chronic disease  Hemoglobin 11.6 on 10/16, stable.    History of seizures  -- Keppra 500 mg twice daily.   Essential hypertension  Remains normotensive off of antihypertensive medication.   Hyponatremia: Resolved Monitor as needed   GERD  -- Protonix 40 mg p.o. daily   Hyperlipidemia  -- Atorvastatin 10 mg.    DVT prophylaxis: SCD's Start: 06/02/23 1515 enoxaparin (LOVENOX) injection 40 mg Start: 05/27/23 1000    Code Status: Full Code Family Communication: No family present at bedside this morning  Disposition Plan:  Level of care: Med-Surg Status is: Inpatient Remains inpatient appropriate because: Medically stable for discharge once safe disposition found, pending group home placement per [facility]    Consultants:  Vascular surgery Orthopedics, Dr. Due to  Procedures:  Left TMA 05/05/2023, Dr. Lajoyce Corners  Antimicrobials:  Ceftriaxone 7/24 - 7/30 Vancomycin 7/26 - 7/30 Metronidazole 7/30 - 7/30   Subjective: Patient seen examined bedside, resting comfortably.  Lying in bed.  Watching TV.  No specific complaints this morning.  Denies headache, no chest pain, no shortness of breath, no abdominal pain.  Working with therapy well.  Awaiting group home placement.  No acute events overnight per nursing.  Medically stable for discharge once safe disposition found by TOC, pending group home.  TOC reports that [place] also now involved in disposition planning.  Objective: Vitals:   08/27/23 1405 08/27/23 1937 08/28/23 0407 08/28/23 0705  BP: 102/61 118/74 98/61 115/79  Pulse: 77 76 72 78  Resp: 16 20 20    Temp:  98.1 F (36.7 C) 97.6 F (36.4 C) 98.2 F (36.8 C) 97.9 F (36.6 C)  TempSrc: Oral Oral Oral Oral  SpO2: 98% 97% 99% 99%  Weight:        Intake/Output Summary (Last 24 hours) at 08/28/2023 1039 Last data filed at 08/28/2023 0408 Gross per 24 hour   Intake 300 ml  Output 750 ml  Net -450 ml   Filed Weights   05/26/23 2321  Weight: 54.6 kg    Examination:  Physical Exam: GEN: NAD, alert and oriented x 3, wd/wn HEENT: NCAT, PERRL, EOMI, sclera clear, MMM PULM: CTAB w/o wheezes/crackles, normal respiratory effort, on room air CV: RRR w/o M/G/R GI: abd soft, NTND, NABS MSK: no peripheral edema, none the left TMA, moves all extremities independently NEURO: CN II-XII intact, no focal deficits, sensation to light touch intact PSYCH: normal mood/affect Integumentary: Left foot TMA site as above, otherwise no other concerning rashes/lesions/wounds noted exposed and services     Data Reviewed: I have personally reviewed following labs and imaging studies  CBC: No results for input(s): "WBC", "NEUTROABS", "HGB", "HCT", "MCV", "PLT" in the last 168 hours.  Basic Metabolic Panel: No results for input(s): "NA", "K", "CL", "CO2", "GLUCOSE", "BUN", "CREATININE", "CALCIUM", "MG", "PHOS" in the last 168 hours.  GFR: Estimated Creatinine Clearance: 59.8 mL/min (by C-G formula based on SCr of 1.04 mg/dL). Liver Function Tests: No results for input(s): "AST", "ALT", "ALKPHOS", "BILITOT", "PROT", "ALBUMIN" in the last 168 hours.  No results for input(s): "LIPASE", "AMYLASE" in the last 168 hours. No results for input(s): "AMMONIA" in the last 168 hours. Coagulation Profile: No results for input(s): "INR", "PROTIME" in the last 168 hours. Cardiac Enzymes: No results for input(s): "CKTOTAL", "CKMB", "CKMBINDEX", "TROPONINI" in the last 168 hours. BNP (last 3 results) No results for input(s): "PROBNP" in the last 8760 hours. HbA1C: No results for input(s): "HGBA1C" in the last 72 hours. CBG: No results for input(s): "GLUCAP" in the last 168 hours. Lipid Profile: No results for input(s): "CHOL", "HDL", "LDLCALC", "TRIG", "CHOLHDL", "LDLDIRECT" in the last 72 hours. Thyroid Function Tests: No results for input(s): "TSH", "T4TOTAL",  "FREET4", "T3FREE", "THYROIDAB" in the last 72 hours. Anemia Panel: No results for input(s): "VITAMINB12", "FOLATE", "FERRITIN", "TIBC", "IRON", "RETICCTPCT" in the last 72 hours. Sepsis Labs: No results for input(s): "PROCALCITON", "LATICACIDVEN" in the last 168 hours.  No results found for this or any previous visit (from the past 240 hour(s)).       Radiology Studies: No results found.      Scheduled Meds:  vitamin C  1,000 mg Oral Daily   atorvastatin  10 mg Oral Daily   enoxaparin (LOVENOX) injection  40 mg Subcutaneous Daily   feeding supplement (GLUCERNA SHAKE)  237 mL Oral BID BM   FLUoxetine  30 mg Oral Daily   levETIRAcetam  500 mg Oral BID   metFORMIN  500 mg Oral BID WC   multivitamins with iron  1 tablet Oral Daily   nutrition supplement (JUVEN)  1 packet Oral BID BM   pantoprazole  40 mg Oral Daily   risperiDONE  0.25 mg Oral QHS   sodium chloride flush  3 mL Intravenous Q12H   Continuous Infusions:   LOS: 94 days    Time spent: 46 minutes spent on chart review, discussion with nursing staff, consultants, updating family and interview/physical exam; more than 50% of that time was spent in counseling and/or coordination of care.    Alvira Philips [place], DO [facility]  Available via Epic secure chat 7am-7pm After these hours, please refer to coverage provider listed on [email or web address] 08/28/2023, 10:39 AM

## 2023-08-29 NOTE — Progress Notes (Signed)
PROGRESS NOTE    [name]  HYW:[id number] DOB: 1965-11-19 DOA: 05/26/2023 PCP: Marcine Matar, MD    Brief Narrative:   [name] is a 57 y.o. 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 Patient was started on IV antibiotics and underwent left TMA with wound VAC by Dr. Lajoyce Corners on 05/05/2023. Therapy recommended home health but patient has no safe disposition. Per sister's report to [facility], "he was staying with some people because he couldn't stay with sister anymore due to violent behaviors. Sister was bringing food and they were letting him stay for free. And now those people are saying he can't come back and sister can't take him either". TOC filed APS report. Now a placement issue. Wound VAC has been removed, sutures removed 9/09.   Patient has been stable for discharge; has progressed well with therapy and now pending group home placement.   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  Vascular surgery was consulted and no indication for vascular intervention at this time.  Orthopedics was consulted and patient underwent left TMA and wound VAC placement by Dr. Lajoyce Corners on 05/05/2023.  Wound is now healed.  WBAT.   -- Continue therapy efforts while inpatient -- Stable for discharge but pending group home placement per TOC.  History of TBI/cognitive impairment and intermittent aggressive behaviors/depression Psychiatry consulted appreciate recommendations,  unfortunately patient does not currently have capacity.  -- Risperdal 0.25 mg p.o. nightly -- Prozac 30 mg p.o. daily   Type 2 diabetes mellitus Hemoglobin A1c 5.8% on 7/25. -- Metformin 500 mg p.o. twice daily   Unsafe  living situation  TOC following, APS involved -- Pending group home placement per TOC   Anemia of chronic disease  Hemoglobin 11.6 on 10/16, stable.    History of seizures  -- Keppra 500 mg twice daily.   Essential hypertension  Remains normotensive off of antihypertensive medication.   Hyponatremia: Resolved Monitor as needed   GERD  -- Protonix 40 mg p.o. daily   Hyperlipidemia  -- Atorvastatin 10 mg.    DVT prophylaxis: SCD's Start: 06/02/23 1515 enoxaparin (LOVENOX) injection 40 mg Start: 05/27/23 1000    Code Status: Full Code Family Communication: No family present at bedside this morning  Disposition Plan:  Level of care: Med-Surg Status is: Inpatient Remains inpatient appropriate because: Medically stable for discharge once safe disposition found, pending group home placement per [facility]    Consultants:  Vascular surgery Orthopedics, Dr. Due to  Procedures:  Left TMA 05/05/2023, Dr. Lajoyce Corners  Antimicrobials:  Ceftriaxone 7/24 - 7/30 Vancomycin 7/26 - 7/30 Metronidazole 7/30 - 7/30   Subjective: Patient seen examined bedside, resting comfortably.  Lying in bed.  Watching TV.  No specific complaints this morning.  Requesting coffee.  Denies headache, no chest pain, no shortness of breath, no abdominal pain.  Working with therapy well.  Awaiting group home placement.  No acute events overnight per nursing.  Medically stable for discharge once safe disposition found by TOC, pending group home.  TOC reports that [name] also now involved in disposition planning.  Objective: Vitals:   08/28/23 1501 08/28/23 2123 08/29/23 0409 08/29/23 0722  BP: 125/75 130/76 121/81 (!) 96/56  Pulse: 69 78 73 77  Resp:  16  18   Temp: 98.2 F (36.8 C) 98 F (36.7 C) 97.7 F (36.5 C) 98.2 F (36.8 C)  TempSrc: Oral Oral  Oral  SpO2: 99% 96% 99% 99%  Weight:        Intake/Output Summary (Last 24 hours) at 08/29/2023 1023 Last data filed at 08/29/2023 0410 Gross  per 24 hour  Intake 596 ml  Output 1425 ml  Net -829 ml   Filed Weights   05/26/23 2321  Weight: 54.6 kg    Examination:  Physical Exam: GEN: NAD, alert and oriented x 3, wd/wn HEENT: NCAT, PERRL, EOMI, sclera clear, MMM PULM: CTAB w/o wheezes/crackles, normal respiratory effort, on room air CV: RRR w/o M/G/R GI: abd soft, NTND, NABS MSK: no peripheral edema, none the left TMA, moves all extremities independently NEURO: CN II-XII intact, no focal deficits, sensation to light touch intact PSYCH: normal mood/affect Integumentary: Left foot TMA site as above, otherwise no other concerning rashes/lesions/wounds noted exposed and services     Data Reviewed: I have personally reviewed following labs and imaging studies  CBC: No results for input(s): "WBC", "NEUTROABS", "HGB", "HCT", "MCV", "PLT" in the last 168 hours.  Basic Metabolic Panel: No results for input(s): "NA", "K", "CL", "CO2", "GLUCOSE", "BUN", "CREATININE", "CALCIUM", "MG", "PHOS" in the last 168 hours.  GFR: Estimated Creatinine Clearance: 59.8 mL/min (by C-G formula based on SCr of 1.04 mg/dL). Liver Function Tests: No results for input(s): "AST", "ALT", "ALKPHOS", "BILITOT", "PROT", "ALBUMIN" in the last 168 hours.  No results for input(s): "LIPASE", "AMYLASE" in the last 168 hours. No results for input(s): "AMMONIA" in the last 168 hours. Coagulation Profile: No results for input(s): "INR", "PROTIME" in the last 168 hours. Cardiac Enzymes: No results for input(s): "CKTOTAL", "CKMB", "CKMBINDEX", "TROPONINI" in the last 168 hours. BNP (last 3 results) No results for input(s): "PROBNP" in the last 8760 hours. HbA1C: No results for input(s): "HGBA1C" in the last 72 hours. CBG: No results for input(s): "GLUCAP" in the last 168 hours. Lipid Profile: No results for input(s): "CHOL", "HDL", "LDLCALC", "TRIG", "CHOLHDL", "LDLDIRECT" in the last 72 hours. Thyroid Function Tests: No results for input(s): "TSH",  "T4TOTAL", "FREET4", "T3FREE", "THYROIDAB" in the last 72 hours. Anemia Panel: No results for input(s): "VITAMINB12", "FOLATE", "FERRITIN", "TIBC", "IRON", "RETICCTPCT" in the last 72 hours. Sepsis Labs: No results for input(s): "PROCALCITON", "LATICACIDVEN" in the last 168 hours.  No results found for this or any previous visit (from the past 240 hour(s)).       Radiology Studies: No results found.      Scheduled Meds:  vitamin C  1,000 mg Oral Daily   atorvastatin  10 mg Oral Daily   enoxaparin (LOVENOX) injection  40 mg Subcutaneous Daily   feeding supplement (GLUCERNA SHAKE)  237 mL Oral BID BM   FLUoxetine  30 mg Oral Daily   levETIRAcetam  500 mg Oral BID   metFORMIN  500 mg Oral BID WC   multivitamins with iron  1 tablet Oral Daily   nutrition supplement (JUVEN)  1 packet Oral BID BM   pantoprazole  40 mg Oral Daily   risperiDONE  0.25 mg Oral QHS   Continuous Infusions:   LOS: 95 days    Time spent: 46 minutes spent on chart review, discussion with nursing staff, consultants, updating family and interview/physical exam; more than 50% of that time was spent in counseling and/or coordination of care.    Alvira Philips [place], DO [facility] Available via Epic secure chat 7am-7pm  After these hours, please refer to coverage provider listed on [email or web address] 08/29/2023, 10:23 AM

## 2023-08-29 NOTE — Progress Notes (Signed)
Mobility Specialist: Progress Note   08/29/23 1538  Mobility  Activity Ambulated with assistance in hallway  Level of Assistance Standby assist, set-up cues, supervision of patient - no hands on  Assistive Device None  Distance Ambulated (ft) 550 ft  LLE Weight Bearing WBAT  Activity Response Tolerated well  Mobility Referral Yes  $Mobility charge 1 Mobility  Mobility Specialist Start Time (ACUTE ONLY) 1057  Mobility Specialist Stop Time (ACUTE ONLY) 1105  Mobility Specialist Time Calculation (min) (ACUTE ONLY) 8 min    Pt was agreeable - received in bed. Interpreter Darrelyn Hillock was present and helpful throughout session. Pt able to don/doff both tennis shoes. Pt required verbal and tactile cues to recall and correct gait to taking slower, longer steps. SV throughout even while following directions. Returned to room without fault. Left in bed with all needs met, call bell in reach. Bed alarm on.   [name] Mobility Specialist Please contact via SecureChat or Rehab office at [phone]

## 2023-08-29 NOTE — Plan of Care (Signed)
Problem: Education: Goal: Ability to describe self-care measures that may prevent or decrease complications (Diabetes Survival Skills Education) will improve Outcome: Progressing Goal: Individualized Educational Video(s) Outcome: Progressing   Problem: Coping: Goal: Ability to adjust to condition or change in health will improve Outcome: Progressing   Problem: Health Behavior/Discharge Planning: Goal: Ability to identify and utilize available resources and services will improve Outcome: Progressing Goal: Ability to manage health-related needs will improve Outcome: Progressing   Problem: Metabolic: Goal: Ability to maintain appropriate glucose levels will improve Outcome: Progressing   Problem: Nutritional: Goal: Progress toward achieving an optimal weight will improve Outcome: Progressing   Problem: Skin Integrity: Goal: Risk for impaired skin integrity will decrease Outcome: Progressing   Problem: Tissue Perfusion: Goal: Adequacy of tissue perfusion will improve Outcome: Progressing   Problem: Education: Goal: Knowledge of General Education information will improve Description: Including pain rating scale, medication(s)/side effects and non-pharmacologic comfort measures Outcome: Progressing   Problem: Health Behavior/Discharge Planning: Goal: Ability to manage health-related needs will improve Outcome: Progressing   Problem: Clinical Measurements: Goal: Ability to maintain clinical measurements within normal limits will improve Outcome: Progressing Goal: Will remain free from infection Outcome: Progressing Goal: Diagnostic test results will improve Outcome: Progressing Goal: Respiratory complications will improve Outcome: Progressing Goal: Cardiovascular complication will be avoided Outcome: Progressing   Problem: Activity: Goal: Risk for activity intolerance will decrease Outcome: Progressing   Problem: Nutrition: Goal: Adequate nutrition will be  maintained Outcome: Progressing   Problem: Coping: Goal: Level of anxiety will decrease Outcome: Progressing   Problem: Elimination: Goal: Will not experience complications related to bowel motility Outcome: Progressing Goal: Will not experience complications related to urinary retention Outcome: Progressing   Problem: Pain Managment: Goal: General experience of comfort will improve Outcome: Progressing   Problem: Safety: Goal: Ability to remain free from injury will improve Outcome: Progressing   Problem: Skin Integrity: Goal: Risk for impaired skin integrity will decrease Outcome: Progressing   Problem: Education: Goal: Knowledge of General Education information will improve Description: Including pain rating scale, medication(s)/side effects and non-pharmacologic comfort measures Outcome: Progressing   Problem: Health Behavior/Discharge Planning: Goal: Ability to manage health-related needs will improve Outcome: Progressing   Problem: Clinical Measurements: Goal: Ability to maintain clinical measurements within normal limits will improve Outcome: Progressing Goal: Will remain free from infection Outcome: Progressing Goal: Diagnostic test results will improve Outcome: Progressing Goal: Respiratory complications will improve Outcome: Progressing Goal: Cardiovascular complication will be avoided Outcome: Progressing   Problem: Activity: Goal: Risk for activity intolerance will decrease Outcome: Progressing   Problem: Nutrition: Goal: Adequate nutrition will be maintained Outcome: Progressing   Problem: Coping: Goal: Level of anxiety will decrease Outcome: Progressing   Problem: Elimination: Goal: Will not experience complications related to bowel motility Outcome: Progressing Goal: Will not experience complications related to urinary retention Outcome: Progressing   Problem: Pain Managment: Goal: General experience of comfort will improve Outcome:  Progressing   Problem: Safety: Goal: Ability to remain free from injury will improve Outcome: Progressing   Problem: Skin Integrity: Goal: Risk for impaired skin integrity will decrease Outcome: Progressing   Problem: Education: Goal: Knowledge of the prescribed therapeutic regimen will improve Outcome: Progressing Goal: Ability to verbalize activity precautions or restrictions will improve Outcome: Progressing Goal: Understanding of discharge needs will improve Outcome: Progressing   Problem: Activity: Goal: Ability to perform//tolerate increased activity and mobilize with assistive devices will improve Outcome: Progressing   Problem: Clinical Measurements: Goal: Postoperative complications will be avoided or minimized Outcome:  Progressing   Problem: Self-Care: Goal: Ability to meet self-care needs will improve Outcome: Progressing

## 2023-08-30 NOTE — Progress Notes (Signed)
Mobility Specialist: Progress Note   08/30/23 1514  Mobility  Activity Ambulated with assistance in hallway  Level of Assistance Standby assist, set-up cues, supervision of patient - no hands on  Assistive Device None  Distance Ambulated (ft) 550 ft  LLE Weight Bearing WBAT  Activity Response Tolerated well  Mobility Referral Yes  $Mobility charge 1 Mobility  Mobility Specialist Start Time (ACUTE ONLY) 1124  Mobility Specialist Stop Time (ACUTE ONLY) 1132  Mobility Specialist Time Calculation (min) (ACUTE ONLY) 8 min    Pt was agreeable - received in bed. Interpreter Bonnye Fava was present and helpful throughout session. Pt able to don/doff both tennis shoes. Pt required max verbal and tactile cues to recall and correct gait to taking slower, longer steps. SV throughout even while following directions. Returned to room without fault. Left in bed with all needs met, call bell in reach. Bed alarm on.   [name] Mobility Specialist Please contact via SecureChat or Rehab office at [phone]

## 2023-08-30 NOTE — Progress Notes (Signed)
PROGRESS NOTE    [name]  ZOX:[id number] DOB: 1966/06/04 DOA: 05/26/2023 PCP: Marcine Matar, MD    Brief Narrative:   [name] is a 57 y.o. 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 Patient was started on IV antibiotics and underwent left TMA with wound VAC by Dr. Lajoyce Corners on 05/05/2023. Therapy recommended home health but patient has no safe disposition. Per sister's report to [facility], "he was staying with some people because he couldn't stay with sister anymore due to violent behaviors. Sister was bringing food and they were letting him stay for free. And now those people are saying he can't come back and sister can't take him either". TOC filed APS report. Now a placement issue. Wound VAC has been removed, sutures removed 9/09.   Patient has been stable for discharge; has progressed well with therapy and now pending group home placement.   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  Vascular surgery was consulted and no indication for vascular intervention at this time.  Orthopedics was consulted and patient underwent left TMA and wound VAC placement by Dr. Lajoyce Corners on 05/05/2023.  Wound is now healed.  WBAT.   -- Continue therapy efforts while inpatient -- Stable for discharge but pending group home placement per TOC.  History of TBI/cognitive impairment and intermittent aggressive behaviors/depression Psychiatry consulted appreciate recommendations,  unfortunately patient does not currently have capacity.  -- Risperdal 0.25 mg p.o. nightly -- Prozac 30 mg p.o. daily   Type 2 diabetes mellitus Hemoglobin A1c 5.8% on 7/25. -- Metformin 500 mg p.o. twice daily   Unsafe  living situation  TOC following, APS involved -- Pending group home placement per TOC   Anemia of chronic disease  Hemoglobin 11.6 on 10/16, stable.    History of seizures  -- Keppra 500 mg twice daily.   Essential hypertension  Remains normotensive off of antihypertensive medication.   Hyponatremia: Resolved Monitor as needed   GERD  -- Protonix 40 mg p.o. daily   Hyperlipidemia  -- Atorvastatin 10 mg.    DVT prophylaxis: SCD's Start: 06/02/23 1515 enoxaparin (LOVENOX) injection 40 mg Start: 05/27/23 1000    Code Status: Full Code Family Communication: No family present at bedside this morning  Disposition Plan:  Level of care: Med-Surg Status is: Inpatient Remains inpatient appropriate because: Medically stable for discharge once safe disposition found, pending group home placement per [facility]    Consultants:  Vascular surgery Orthopedics, Dr. Due to  Procedures:  Left TMA 05/05/2023, Dr. Lajoyce Corners  Antimicrobials:  Ceftriaxone 7/24 - 7/30 Vancomycin 7/26 - 7/30 Metronidazole 7/30 - 7/30   Subjective: Patient seen examined bedside, resting comfortably.  Lying in bed.  Watching TV.  No specific complaints this morning.  Denies headache, no chest pain, no shortness of breath, no abdominal pain.  Working with therapy well.  Awaiting placement.  No acute events overnight per nursing.  Medically stable for discharge once safe disposition found by TOC.  TOC reports that [place] also now involved in disposition planning.  Objective: Vitals:   08/29/23 0722 08/29/23 1520 08/30/23 0400 08/30/23 0752  BP: (!) 96/56 119/71 125/80 130/75  Pulse: 77 77 67 89  Resp:   16 18  Temp: 98.2 F (36.8 C)  98.2 F (36.8 C) 98.5 F (36.9 C) 98.4 F (36.9 C)  TempSrc: Oral Oral Oral Oral  SpO2: 99% 98% 96% 98%  Weight:        Intake/Output Summary (Last 24 hours) at 08/30/2023 1050 Last data filed at 08/29/2023 1400 Gross per 24 hour  Intake 240 ml  Output 450 ml   Net -210 ml   Filed Weights   05/26/23 2321  Weight: 54.6 kg    Examination:  Physical Exam: GEN: NAD, alert and oriented x 3, wd/wn HEENT: NCAT, PERRL, EOMI, sclera clear, MMM PULM: CTAB w/o wheezes/crackles, normal respiratory effort, on room air CV: RRR w/o M/G/R GI: abd soft, NTND, NABS MSK: no peripheral edema, none the left TMA, moves all extremities independently NEURO: CN II-XII intact, no focal deficits, sensation to light touch intact PSYCH: normal mood/affect Integumentary: Left foot TMA site as above, otherwise no other concerning rashes/lesions/wounds noted exposed and services     Data Reviewed: I have personally reviewed following labs and imaging studies  CBC: No results for input(s): "WBC", "NEUTROABS", "HGB", "HCT", "MCV", "PLT" in the last 168 hours.  Basic Metabolic Panel: No results for input(s): "NA", "K", "CL", "CO2", "GLUCOSE", "BUN", "CREATININE", "CALCIUM", "MG", "PHOS" in the last 168 hours.  GFR: Estimated Creatinine Clearance: 59.8 mL/min (by C-G formula based on SCr of 1.04 mg/dL). Liver Function Tests: No results for input(s): "AST", "ALT", "ALKPHOS", "BILITOT", "PROT", "ALBUMIN" in the last 168 hours.  No results for input(s): "LIPASE", "AMYLASE" in the last 168 hours. No results for input(s): "AMMONIA" in the last 168 hours. Coagulation Profile: No results for input(s): "INR", "PROTIME" in the last 168 hours. Cardiac Enzymes: No results for input(s): "CKTOTAL", "CKMB", "CKMBINDEX", "TROPONINI" in the last 168 hours. BNP (last 3 results) No results for input(s): "PROBNP" in the last 8760 hours. HbA1C: No results for input(s): "HGBA1C" in the last 72 hours. CBG: No results for input(s): "GLUCAP" in the last 168 hours. Lipid Profile: No results for input(s): "CHOL", "HDL", "LDLCALC", "TRIG", "CHOLHDL", "LDLDIRECT" in the last 72 hours. Thyroid Function Tests: No results for input(s): "TSH", "T4TOTAL", "FREET4", "T3FREE", "THYROIDAB" in  the last 72 hours. Anemia Panel: No results for input(s): "VITAMINB12", "FOLATE", "FERRITIN", "TIBC", "IRON", "RETICCTPCT" in the last 72 hours. Sepsis Labs: No results for input(s): "PROCALCITON", "LATICACIDVEN" in the last 168 hours.  No results found for this or any previous visit (from the past 240 hour(s)).       Radiology Studies: No results found.      Scheduled Meds:  vitamin C  1,000 mg Oral Daily   atorvastatin  10 mg Oral Daily   enoxaparin (LOVENOX) injection  40 mg Subcutaneous Daily   feeding supplement (GLUCERNA SHAKE)  237 mL Oral BID BM   FLUoxetine  30 mg Oral Daily   levETIRAcetam  500 mg Oral BID   metFORMIN  500 mg Oral BID WC   multivitamins with iron  1 tablet Oral Daily   nutrition supplement (JUVEN)  1 packet Oral BID BM   pantoprazole  40 mg Oral Daily   risperiDONE  0.25 mg Oral QHS   Continuous Infusions:   LOS: 96 days    Time spent: 46 minutes spent on chart review, discussion with nursing staff, consultants, updating family and interview/physical exam; more than 50% of that time was spent in counseling and/or coordination of care.    Alvira Philips [place], DO [facility] Available via Epic secure chat 7am-7pm After these hours, please refer to coverage provider  listed on [email or web address] 08/30/2023, 10:50 AM

## 2023-08-30 NOTE — Plan of Care (Signed)
  Problem: Activity: Goal: Risk for activity intolerance will decrease Outcome: Progressing   Problem: Safety: Goal: Ability to remain free from injury will improve Outcome: Progressing   Problem: Skin Integrity: Goal: Risk for impaired skin integrity will decrease Outcome: Progressing   Problem: Education: Goal: Knowledge of General Education information will improve Description: Including pain rating scale, medication(s)/side effects and non-pharmacologic comfort measures Outcome: Progressing

## 2023-08-31 MED ORDER — VITAMIN C 500 MG PO TABS
250.0000 mg | ORAL_TABLET | Freq: Every day | ORAL | Status: DC
  Administered 2023-09-01 – 2023-09-09 (×9): 250 mg via ORAL
  Filled 2023-08-31 (×9): qty 1

## 2023-08-31 NOTE — Progress Notes (Addendum)
  Nutrition Follow-up Brief Note   Patient remains medically stable for discharge. Awaiting safe discharge plan.    Patient remains on a carb modified diet; consuming 100% of meals, continues with Glucerna Shake bid.  Using language interpretor, patient stated he has been getting enough to eat with a good appetite, taking all of the oral supplements.   Reviewed medications and labs.   Recommend discontinue Juven bid as received adequate length of time and recommend decrease vitamin C to 250 mg daily.   Please re-consult if additional nutrition related concerns arise in the meantime, please re-consult or reach out via pager/secure chat.      Alvino Chapel, RDLD Clinical Dietitian See AMION for contact information

## 2023-08-31 NOTE — Progress Notes (Signed)
   08/31/23 1143  Mobility  Activity Ambulated with assistance in hallway  Level of Assistance Standby assist, set-up cues, supervision of patient - no hands on (CG)  Assistive Device None  Distance Ambulated (ft) 550 ft  LLE Weight Bearing WBAT  Activity Response Tolerated well  Mobility Referral Yes  $Mobility charge 1 Mobility  Mobility Specialist Start Time (ACUTE ONLY) 1133  Mobility Specialist Stop Time (ACUTE ONLY) 1143  Mobility Specialist Time Calculation (min) (ACUTE ONLY) 10 min   Mobility Specialist: Progress Note  Pt agreeable to mobility session - received in bed. Required SV for ambulation, CG when turning corners. Verbal cues to slow down pace of gait. Pt was asymptomatic throughout session with no complaints. Returned to bed with all needs met - call bell within reach. Bed alarm on.   [name], BS Mobility Specialist Please contact via SecureChat or Rehab office at [phone].
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
## 2023-09-01 LAB — BASIC METABOLIC PANEL
Anion gap: 10 (ref 5–15)
BUN: 35 mg/dL — ABNORMAL HIGH (ref 6–20)
CO2: 23 mmol/L (ref 22–32)
Calcium: 9.2 mg/dL (ref 8.9–10.3)
Chloride: 103 mmol/L (ref 98–111)
Creatinine, Ser: 1.07 mg/dL (ref 0.61–1.24)
GFR, Estimated: >60 mL/min (ref >60)
Glucose, Bld: 139 mg/dL — ABNORMAL HIGH (ref 70–99)
Potassium: 4.3 mmol/L (ref 3.5–5.1)
Sodium: 136 mmol/L (ref 135–145)

## 2023-09-01 LAB — CBC
HCT: 34.2 % — ABNORMAL LOW (ref 39.0–52.0)
Hemoglobin: 11.8 g/dL — ABNORMAL LOW (ref 13.0–17.0)
MCH: 29.2 pg (ref 26.0–34.0)
MCHC: 34.5 g/dL (ref 30.0–36.0)
MCV: 84.7 fL (ref 80.0–100.0)
Platelets: 252 10*3/uL (ref 150–400)
RBC: 4.04 MIL/uL — ABNORMAL LOW (ref 4.22–5.81)
RDW: 12.3 % (ref 11.5–15.5)
WBC: 5.6 10*3/uL (ref 4.0–10.5)
nRBC: 0 % (ref 0.0–0.2)
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
## 2023-09-01 NOTE — Plan of Care (Signed)
  Problem: Education: Goal: Knowledge of General Education information will improve Description Including pain rating scale, medication(s)/side effects and non-pharmacologic comfort measures Outcome: Progressing   

## 2023-09-01 NOTE — Progress Notes (Signed)
Physical Therapy Treatment Patient Details Name: [name] MRN: [id number] DOB: September 21, 1966 Today's Date: 09/01/2023   History of Present Illness Pt is a 57 year old male admitted for evaluation of being unable to get up and ambulate, and not eating for 2 days, admitted for left foot gangrene with osteomyelitis. 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    PT Comments  Continuing work on functional mobility and activity tolerance;  Session focused on more complicated amb with an element of pathfinding; Walked to the elevator, pressed elevator buttons with corrective cues, and walked out front doors to the courtyard; performed therex aimed at balance, stretching, and strengthening (trying to ask [place] what exercises he did working in wrestling, [employer]; Particiapted well and seemed to enjoy being outside; plan to consider a way to walk on a more narrow path (perhaps borrow cones from [employer], or place temporary tape on floor?)   If plan is discharge home, recommend the following: A little help with walking and/or transfers;A little help with bathing/dressing/bathroom;Assistance with cooking/housework;Direct supervision/assist for medications management;Assist for transportation;Help with stairs or ramp for entrance   Can travel by private vehicle     Yes  Equipment Recommendations  None recommended by PT    Recommendations for Other Services       Precautions / Restrictions Precautions Precautions: Fall;Other (comment) Precaution Comments: History of TBI with left side weakness and cognitive impairments Restrictions LLE Weight Bearing: Weight bearing as tolerated     Mobility  Bed Mobility Overal bed mobility: Needs Assistance, Modified Independent Bed Mobility: Supine to Sit, Sit to Supine     Supine to sit: Supervision Sit to supine: Supervision   General bed mobility  comments: supervision for safety, no assist to move. pt donning shoes in bed then transitioning to sitting EOB    Transfers Overall transfer level: Needs assistance Equipment used: None Transfers: Sit to/from Stand Sit to Stand: Supervision           General transfer comment: steadies using very large BoS    Ambulation/Gait Ambulation/Gait assistance: Supervision Gait Distance (Feet): 700 Feet (greater than) Assistive device: None Gait Pattern/deviations: Shuffle, Staggering left, Staggering right, Wide base of support       General Gait Details: pt with both of his sneakers on today, and while he continues to have a very wide BoS he has more fluidity with his ambulation and better reciprocal arm and leg motion. focus on dynamic balance task/uneven surface amb with gait, no overt LOB but pt at times staggering or stopping to complete task; walked outside to courtyard, worked on pathfinding back to room   [employer]    Modified Rankin (Stroke Patients Only)       Balance     Sitting balance-Leahy Scale: Normal       Standing balance-Leahy Scale: Fair Standing balance comment: looks for support but is able to stabilize with both static and dynamic balance at a contact guard level                            Cognition Arousal: Alert Behavior During Therapy: Impulsive Overall Cognitive Status: History of cognitive impairments - at baseline  General Comments: hx of cognitive impairment, impulsive, requires cues for safety and to ask for assistance. needs repeated cues to maintain corrections. very poor STM in regards to multi-step command following and application of instructions to functional task        Exercises Other Exercises Other Exercises: squats x5 Other Exercises: standing side bends R and L x2 Other Exercises: single UE support for single leg  stance with tapping non-stance foot, r and L    General Comments        Pertinent Vitals/Pain Pain Assessment Pain Assessment: No/denies pain Pain Intervention(s): Monitored during session    Home Living                          Prior Function            PT Goals (current goals can now be found in the care plan section) Acute Rehab PT Goals Patient Stated Goal: agreeable to going outside PT Goal Formulation: With patient Time For Goal Achievement: 09/10/23 Potential to Achieve Goals: Good Progress towards PT goals: Progressing toward goals    Frequency    Min 1X/week      PT Plan Current plan remains appropriate    Co-evaluation              AM-PAC PT "6 Clicks" Mobility   Outcome Measure  Help needed turning from your back to your side while in a flat bed without using bedrails?: None Help needed moving from lying on your back to sitting on the side of a flat bed without using bedrails?: None Help needed moving to and from a bed to a chair (including a wheelchair)?: A Little Help needed standing up from a chair using your arms (e.g., wheelchair or bedside chair)?: A Little Help needed to walk in hospital room?: A Little Help needed climbing 3-5 steps with a railing? : A Little 6 Click Score: 20    End of Session Equipment Utilized During Treatment: Gait belt Activity Tolerance: Patient tolerated treatment well Patient left: in bed;with call bell/phone within reach;with bed alarm set Nurse Communication: Mobility status PT Visit Diagnosis: Other abnormalities of gait and mobility (R26.89)     Time: 8657-8469 PT Time Calculation (min) (ACUTE ONLY): 31 min  Charges:    $Gait Training: 23-37 mins PT General Charges $$ ACUTE PT VISIT: 1 Visit                     Van Clines, PT  Acute Rehabilitation Services Office [phone] Secure Chat welcomed    [name] 09/01/2023, 2:12 PM

## 2023-09-01 NOTE — Progress Notes (Signed)
Mobility Specialist: Progress Note   09/01/23 1107  Mobility  Activity Ambulated with assistance in hallway  Level of Assistance Standby assist, set-up cues, supervision of patient - no hands on  Assistive Device None  Distance Ambulated (ft) 650 ft  LLE Weight Bearing WBAT  Activity Response Tolerated well  Mobility Referral Yes  $Mobility charge 1 Mobility  Mobility Specialist Start Time (ACUTE ONLY) 1000  Mobility Specialist Stop Time (ACUTE ONLY) 1012  Mobility Specialist Time Calculation (min) (ACUTE ONLY) 12 min    Pt was agreeable - received in bed. Interpreter Bonnye Fava was present and helpful throughout session. Pt able to don/doff both tennis shoes. Pt required max verbal and tactile cues to recall and correct gait to taking slower, longer steps. SV throughout even while following directions. Returned to room without fault. Left in bed with all needs met, call bell in reach. Bed alarm on.   [name] Mobility Specialist Please contact via SecureChat or Rehab office at [phone]

## 2023-09-02 NOTE — Progress Notes (Signed)
Physical Therapy Treatment Patient Details Name: [name] MRN: [id number] DOB: [birth year] Today's Date: 09/02/2023   History of Present Illness Pt is a 57 year old male admitted for evaluation of being unable to get up and ambulate, and not eating for 2 days, admitted for left foot gangrene with osteomyelitis. 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    PT Comments  The pt was agreeable to session, continues to demo improved endurance and stability with regular walking. He does however demo slowed pace, even stopping at times, with addition of cognitive task or reaching activity with gait. He benefits from minA or UE support with stairs or dynamic single leg tasks. He is able to initiate reaching for UE support, showing good awareness of deficits, but requires max cues to maintain instructions or find his way back to his room. Continue to recommend acute PT to progress independence and safety with all movements.    If plan is discharge home, recommend the following: A little help with walking and/or transfers;A little help with bathing/dressing/bathroom;Assistance with cooking/housework;Direct supervision/assist for medications management;Assist for transportation;Help with stairs or ramp for entrance   Can travel by private vehicle     No  Equipment Recommendations  None recommended by PT    Recommendations for Other Services       Precautions / Restrictions Precautions Precautions: Fall;Other (comment) Precaution Comments: History of TBI with left side weakness and cognitive impairments Restrictions Weight Bearing Restrictions: No LLE Weight Bearing: Weight bearing as tolerated     Mobility  Bed Mobility Overal bed mobility: Needs Assistance, Modified Independent Bed Mobility: Supine to Sit, Sit to Supine     Supine to sit: Supervision Sit to supine: Supervision   General  bed mobility comments: supervision for safety, no assist to move. pt donning shoes in bed then transitioning to sitting EOB    Transfers Overall transfer level: Needs assistance Equipment used: None Transfers: Sit to/from Stand Sit to Stand: Supervision           General transfer comment: steadies using very large BoS    Ambulation/Gait Ambulation/Gait assistance: Supervision Gait Distance (Feet): 500 Feet (+ 500) Assistive device: None Gait Pattern/deviations: Shuffle, Staggering left, Staggering right, Wide base of support Gait velocity: sometimes to fast for conditions     General Gait Details: pt with both of his sneakers on today, and while he continues to have a very wide BoS he has more fluidity with his ambulation and better reciprocal arm and leg motion. focus on dynamic balance task with gait, no overt LOB but pt at times staggering or stopping to complete task   Stairs Stairs: Yes Stairs assistance: Contact guard assist, Min assist Stair Management: Two rails, Alternating pattern, Forwards Number of Stairs: 3 (x3) General stair comments: CGA with bilateral rails, minA with single UE support.      Balance Overall balance assessment: Needs assistance Sitting-balance support: No upper extremity supported, Feet supported Sitting balance-Leahy Scale: Normal     Standing balance support: Single extremity supported, During functional activity Standing balance-Leahy Scale: Fair Standing balance comment: looks for support but is able to stabilize with both static and dynamic balance at a contact guard level               High Level Balance Comments: passing water bottle back and forth laterally initially then on diagonal while walking. at times pt staggering or stopping to complete reach towards R side  without losing balance.            Cognition Arousal: Alert Behavior During Therapy: Impulsive Overall Cognitive Status: History of cognitive impairments  - at baseline                                 General Comments: hx of cognitive impairment, impulsive, requires cues for safety and to ask for assistance. needs repeated cues to maintain corrections. very poor STM in regards to multi-step command following and application of instructions to functional task        Exercises Other Exercises Other Exercises: lateral and diagonal reaching while ambulating to challenge dual task and dynamic balance. completed x10 laterally in both directions then diagonally from high-low and low-high x10 Other Exercises: toe taps to 6" stair and 12" step. pt needing CGA with BUE support or minA with single UE support Other Exercises: stepping over obstacle on floor x5, pt prefers to lead with RLE stepping over.    General Comments General comments (skin integrity, edema, etc.): VSS on RA      Pertinent Vitals/Pain Pain Assessment Pain Assessment: Faces Faces Pain Scale: No hurt Pain Intervention(s): Monitored during session           PT Goals (current goals can now be found in the care plan section) Acute Rehab PT Goals Patient Stated Goal: to walk around PT Goal Formulation: With patient Time For Goal Achievement: 09/10/23 Potential to Achieve Goals: Good Progress towards PT goals: Progressing toward goals    Frequency    Min 1X/week      PT Plan Current plan remains appropriate       AM-PAC PT "6 Clicks" Mobility   Outcome Measure  Help needed turning from your back to your side while in a flat bed without using bedrails?: None Help needed moving from lying on your back to sitting on the side of a flat bed without using bedrails?: None Help needed moving to and from a bed to a chair (including a wheelchair)?: A Little Help needed standing up from a chair using your arms (e.g., wheelchair or bedside chair)?: A Little Help needed to walk in hospital room?: A Little Help needed climbing 3-5 steps with a railing? : A  Little 6 Click Score: 20    End of Session Equipment Utilized During Treatment: Gait belt Activity Tolerance: Patient tolerated treatment well Patient left: with call bell/phone within reach;in chair;with chair alarm set;with family/visitor present Nurse Communication: Mobility status PT Visit Diagnosis: Other abnormalities of gait and mobility (R26.89)     Time: 2841-3244 PT Time Calculation (min) (ACUTE ONLY): 27 min  Charges:    $Gait Training: 8-22 mins $Neuromuscular Re-education: 8-22 mins PT General Charges $$ ACUTE PT VISIT: 1 Visit                     Vickki Muff, PT, DPT   Acute Rehabilitation Department Office [phone] Secure Chat Communication Preferred   [name] 09/02/2023, 3:32 PM

## 2023-09-02 NOTE — Plan of Care (Signed)
  Problem: Coping: Goal: Ability to adjust to condition or change in health will improve Outcome: Progressing   

## 2023-09-02 NOTE — Progress Notes (Signed)
PROGRESS NOTE    [name]  ZOX:[id number] DOB: December 04, 1965 DOA: 05/26/2023 PCP: Marcine Matar, MD   Brief Narrative:   57 y.o. 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  He underwent left TMA with wound VAC by Dr. Lajoyce Corners on 06/02/2023.  Therapy recommended home health but patient has no safe disposition. Per sister's report to [facility], "he was staying with some people because he couldn't stay with sister anymore due to violent behaviors. Sister was bringing food and they were letting him stay for free. And now those people are saying he can't come back and sister can't take him either". TOC filed APS report. Now a placement issue. Wound VAC has been removed, sutures removed 9/09.    Assessment & Plan:   Left foot cellulitis with gangrene/osteomyelitis -MRI with osteo of the 2nd toe and plantar muscles suggesting myositis He was seen by the vascular surgeon and there was no indication for vascular intervention. -S/p left TMA and wound VAC placement by Dr. Lajoyce Corners on 7/31. Wound VAC has been removed, sutures removed 9/09.  -Per Ortho, surgical source control achieved.  Antibiotics discontinued. -Analgesics as needed for pain. -Stable for discharge but pending group home placement per TOC.     H/o TBI/cognitive impairment and intermittent aggressive behaviors Depression -Continue Risperdal, Prozac  -Psychiatry evaluation appreciated: Patient does not have capacity   Unsafe living situation: Sister says living in a filthy, unsafe living situation.  Patient with TBI/cognitive impairment.  Has no capacity to make medical decision. -TOC filed APS report.   Controlled DM-2 with hyperglycemia:  -Hemoglobin A1c was 5.8% on 05/27/2023.  Continue metformin and carb and fat diet..   Normocytic anemia: H&H stable, 11.8  today.  Anemia panel suggests anemia of chronic disease.  Monitor intermittently.   Fall in the hospital: No apparent injury or acute focal neurodeficit. -Fall precaution   Seizure disorder  -Continue Keppra    Recurrent hyponatremia: Resolved  Hypertension -Blood pressure stable.  Currently not on any antihypertensives   Hyperlipidemia -Continue statin  GERD -Continue Protonix     DVT prophylaxis: Lovenox Code Status: Full Family Communication: None at bedside Disposition Plan: Status is: Inpatient Remains inpatient appropriate because: Of severity of illness.  Unsafe disposition plan    Consultants: Orthopedic/vascular surgery/psychiatry   Procedures: As above    Antimicrobials:  Ceftriaxone 7/24 - 7/30 Vancomycin 7/26 - 7/30 Metronidazole 7/30 - 7/30  Subjective: Patient seen and examined at bedside.  No agitation, seizures, fever or vomiting reported. Objective: Vitals:   09/01/23 2009 09/02/23 0544 09/02/23 0732 09/02/23 0736  BP: 130/75 (!) 141/79  114/74  Pulse: 72 70  75  Resp:  14    Temp: 97.6 F (36.4 C) 97.9 F (36.6 C)  98.2 F (36.8 C)  TempSrc: Oral Oral  Oral  SpO2: 97% 95%  99%  Weight:   63.2 kg     Intake/Output Summary (Last 24 hours) at 09/02/2023 0845 Last data filed at 09/01/2023 2000 Gross per 24 hour  Intake 116 ml  Output --  Net 116 ml   Filed Weights   05/26/23 2321 09/02/23 0732  Weight: 54.6 kg 63.2 kg    Examination:  General: Remains on room air.  No distress.  Chronically ill and deconditioned looking.  Extremely slow to respond.  Poor historian.  Flat affect. respiratory:  Decreased breath sounds at bases bilaterally with some crackles CVS: Currently rate controlled; S1-S2 heard  abdominal: Soft, nontender, slightly distended, no organomegaly; bowel sounds are heard  extremities: Trace lower extremity edema; no clubbing.  Left foot TMA present   Data Reviewed: I have personally reviewed following labs and  imaging studies  CBC: Recent Labs  Lab 09/01/23 0844  WBC 5.6  HGB 11.8*  HCT 34.2*  MCV 84.7  PLT 252   Basic Metabolic Panel: Recent Labs  Lab 09/01/23 0844  NA 136  K 4.3  CL 103  CO2 23  GLUCOSE 139*  BUN 35*  CREATININE 1.07  CALCIUM 9.2   GFR: Estimated Creatinine Clearance: 58.1 mL/min (by C-G formula based on SCr of 1.07 mg/dL). Liver Function Tests: No results for input(s): "AST", "ALT", "ALKPHOS", "BILITOT", "PROT", "ALBUMIN" in the last 168 hours. No results for input(s): "LIPASE", "AMYLASE" in the last 168 hours. No results for input(s): "AMMONIA" in the last 168 hours. Coagulation Profile: No results for input(s): "INR", "PROTIME" in the last 168 hours. Cardiac Enzymes: No results for input(s): "CKTOTAL", "CKMB", "CKMBINDEX", "TROPONINI" in the last 168 hours. BNP (last 3 results) No results for input(s): "PROBNP" in the last 8760 hours. HbA1C: No results for input(s): "HGBA1C" in the last 72 hours. CBG: No results for input(s): "GLUCAP" in the last 168 hours. Lipid Profile: No results for input(s): "CHOL", "HDL", "LDLCALC", "TRIG", "CHOLHDL", "LDLDIRECT" in the last 72 hours. Thyroid Function Tests: No results for input(s): "TSH", "T4TOTAL", "FREET4", "T3FREE", "THYROIDAB" in the last 72 hours. Anemia Panel: No results for input(s): "VITAMINB12", "FOLATE", "FERRITIN", "TIBC", "IRON", "RETICCTPCT" in the last 72 hours. Sepsis Labs: No results for input(s): "PROCALCITON", "LATICACIDVEN" in the last 168 hours.  No results found for this or any previous visit (from the past 240 hour(s)).       Radiology Studies: No results found.      Scheduled Meds:  vitamin C  250 mg Oral Daily   atorvastatin  10 mg Oral Daily   enoxaparin (LOVENOX) injection  40 mg Subcutaneous Daily   feeding supplement (GLUCERNA SHAKE)  237 mL Oral BID BM   FLUoxetine  30 mg Oral Daily   levETIRAcetam  500 mg Oral BID   metFORMIN  500 mg Oral BID WC   multivitamins  with iron  1 tablet Oral Daily   pantoprazole  40 mg Oral Daily   risperiDONE  0.25 mg Oral QHS   Continuous Infusions:        Glade Lloyd, MD [facility] 09/02/2023, 8:45 AM

## 2023-09-02 NOTE — Progress Notes (Signed)
Mobility Specialist: Progress Note   09/02/23 1213  Mobility  Activity Ambulated with assistance in hallway  Level of Assistance Standby assist, set-up cues, supervision of patient - no hands on  Assistive Device None  Distance Ambulated (ft) 550 ft  LLE Weight Bearing WBAT  Activity Response Tolerated well  Mobility Referral Yes  $Mobility charge 1 Mobility  Mobility Specialist Start Time (ACUTE ONLY) 0955  Mobility Specialist Stop Time (ACUTE ONLY) 1003  Mobility Specialist Time Calculation (min) (ACUTE ONLY) 8 min    Pt was agreeable - received in bed. Interpreter Bonnye Fava was present and helpful throughout session. Pt able to don/doff both tennis shoes. Pt required max verbal and tactile cues to take slower, longer steps and to correct posture. SV throughout even while following directions. Returned to room without fault. Left in bed with all needs met, call bell in reach. Bed alarm on.   [name] Mobility Specialist Please contact via SecureChat or Rehab office at [phone]

## 2023-09-02 NOTE — Plan of Care (Signed)
  Problem: Education: Goal: Knowledge of General Education information will improve Description Including pain rating scale, medication(s)/side effects and non-pharmacologic comfort measures Outcome: Progressing   

## 2023-09-03 NOTE — Progress Notes (Signed)
PROGRESS NOTE    [name]  ZOX:[id number] DOB: 01/22/1966 DOA: 05/26/2023 PCP: Marcine Matar, MD   Brief Narrative:   57 y.o. 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  He underwent left TMA with wound VAC by Dr. Lajoyce Corners on 06/02/2023.  Therapy recommended home health but patient has no safe disposition. Per sister's report to [facility], "he was staying with some people because he couldn't stay with sister anymore due to violent behaviors. Sister was bringing food and they were letting him stay for free. And now those people are saying he can't come back and sister can't take him either". TOC filed APS report. Now a placement issue. Wound VAC has been removed, sutures removed 9/09.    Assessment & Plan:   Left foot cellulitis with gangrene/osteomyelitis -MRI with osteo of the 2nd toe and plantar muscles suggesting myositis He was seen by the vascular surgeon and there was no indication for vascular intervention. -S/p left TMA and wound VAC placement by Dr. Lajoyce Corners on 7/31. Wound VAC has been removed, sutures removed 9/09.  -Per Ortho, surgical source control achieved.  Antibiotics discontinued. -Analgesics as needed for pain. -Stable for discharge but pending group home placement per TOC.     H/o TBI/cognitive impairment and intermittent aggressive behaviors Depression -Continue Risperdal, Prozac  -Psychiatry evaluation appreciated: Patient does not have capacity   Unsafe living situation: Sister says living in a filthy, unsafe living situation.  Patient with TBI/cognitive impairment.  Has no capacity to make medical decision. -TOC filed APS report.   Controlled DM-2 with hyperglycemia:  -Hemoglobin A1c was 5.8% on 05/27/2023.  Continue metformin and carb and fat diet..   Normocytic anemia: H&H stable, 11.8  today.  Anemia panel suggests anemia of chronic disease.  Monitor intermittently.   Fall in the hospital: No apparent injury or acute focal neurodeficit. -Fall precaution   Seizure disorder  -Continue Keppra    Recurrent hyponatremia: Resolved  Hypertension -Blood pressure stable.  Currently not on any antihypertensives   Hyperlipidemia -Continue statin  GERD -Continue Protonix     DVT prophylaxis: Lovenox Code Status: Full Family Communication: None at bedside Disposition Plan: Status is: Inpatient Remains inpatient appropriate because: Of severity of illness.  Unsafe disposition plan    Consultants: Orthopedic/vascular surgery/psychiatry   Procedures: As above    Antimicrobials:  Ceftriaxone 7/24 - 7/30 Vancomycin 7/26 - 7/30 Metronidazole 7/30 - 7/30  Subjective: Patient seen and examined at bedside.  No fever, vomiting or agitation reported.   Objective: Vitals:   09/02/23 1432 09/02/23 2009 09/03/23 0425 09/03/23 0739  BP: 115/78 121/82 96/60 139/75  Pulse: 76 69 73 67  Resp: 16 18 14 16   Temp: 97.8 F (36.6 C) 98.3 F (36.8 C) 98.3 F (36.8 C) 97.7 F (36.5 C)  TempSrc:  Oral Oral Oral  SpO2: 98% 97% 96% 98%  Weight:        Intake/Output Summary (Last 24 hours) at 09/03/2023 0740 Last data filed at 09/02/2023 1800 Gross per 24 hour  Intake 720 ml  Output 300 ml  Net 420 ml   Filed Weights   05/26/23 2321 09/02/23 0732  Weight: 54.6 kg 63.2 kg    Examination:  General: No acute distress.  On room air.  Chronically ill and deconditioned looking.  Extremely slow to respond.  Poor historian.  Flat affect. respiratory: Bilateral decreased breath sounds at bases with scattered crackles CVS: S1 and S2 are heard; rate mostly controlled abdominal: Soft, nontender, distended slightly, no organomegaly; bowel sounds normally heard  extremities: No cyanosis; mild lower extremity edema present.  Left foot TMA present   Data Reviewed: I have personally  reviewed following labs and imaging studies  CBC: Recent Labs  Lab 09/01/23 0844  WBC 5.6  HGB 11.8*  HCT 34.2*  MCV 84.7  PLT 252   Basic Metabolic Panel: Recent Labs  Lab 09/01/23 0844  NA 136  K 4.3  CL 103  CO2 23  GLUCOSE 139*  BUN 35*  CREATININE 1.07  CALCIUM 9.2   GFR: Estimated Creatinine Clearance: 58.1 mL/min (by C-G formula based on SCr of 1.07 mg/dL). Liver Function Tests: No results for input(s): "AST", "ALT", "ALKPHOS", "BILITOT", "PROT", "ALBUMIN" in the last 168 hours. No results for input(s): "LIPASE", "AMYLASE" in the last 168 hours. No results for input(s): "AMMONIA" in the last 168 hours. Coagulation Profile: No results for input(s): "INR", "PROTIME" in the last 168 hours. Cardiac Enzymes: No results for input(s): "CKTOTAL", "CKMB", "CKMBINDEX", "TROPONINI" in the last 168 hours. BNP (last 3 results) No results for input(s): "PROBNP" in the last 8760 hours. HbA1C: No results for input(s): "HGBA1C" in the last 72 hours. CBG: No results for input(s): "GLUCAP" in the last 168 hours. Lipid Profile: No results for input(s): "CHOL", "HDL", "LDLCALC", "TRIG", "CHOLHDL", "LDLDIRECT" in the last 72 hours. Thyroid Function Tests: No results for input(s): "TSH", "T4TOTAL", "FREET4", "T3FREE", "THYROIDAB" in the last 72 hours. Anemia Panel: No results for input(s): "VITAMINB12", "FOLATE", "FERRITIN", "TIBC", "IRON", "RETICCTPCT" in the last 72 hours. Sepsis Labs: No results for input(s): "PROCALCITON", "LATICACIDVEN" in the last 168 hours.  No results found for this or any previous visit (from the past 240 hour(s)).       Radiology Studies: No results found.      Scheduled Meds:  vitamin C  250 mg Oral Daily   atorvastatin  10 mg Oral Daily   enoxaparin (LOVENOX) injection  40 mg Subcutaneous Daily   feeding supplement (GLUCERNA SHAKE)  237 mL Oral BID BM   FLUoxetine  30 mg Oral Daily   levETIRAcetam  500 mg Oral BID   metFORMIN  500 mg  Oral BID WC   multivitamins with iron  1 tablet Oral Daily   pantoprazole  40 mg Oral Daily   risperiDONE  0.25 mg Oral QHS   Continuous Infusions:        Glade Lloyd, MD [facility] 09/03/2023, 7:40 AM

## 2023-09-03 NOTE — Plan of Care (Signed)
  Problem: Education: Goal: Knowledge of General Education information will improve Description Including pain rating scale, medication(s)/side effects and non-pharmacologic comfort measures Outcome: Progressing   

## 2023-09-03 NOTE — Progress Notes (Signed)
Mobility Specialist: Progress Note   09/03/23 1236  Mobility  Activity Ambulated with assistance in hallway  Level of Assistance Standby assist, set-up cues, supervision of patient - no hands on  Assistive Device None  Distance Ambulated (ft) 550 ft  LLE Weight Bearing WBAT  Activity Response Tolerated well  Mobility Referral Yes  $Mobility charge 1 Mobility  Mobility Specialist Start Time (ACUTE ONLY) [id number]  Mobility Specialist Stop Time (ACUTE ONLY) 1006  Mobility Specialist Time Calculation (min) (ACUTE ONLY) 10 min    Pt was agreeable - received in bed. Interpreter Bonnye Fava was present and helpful throughout session. Pt able to don/doff both tennis shoes. Pt required max verbal and tactile cues to take slower, longer steps and to correct posture. SV throughout even while following directions. Returned to room without fault. Left in bed with all needs met, call bell in reach. RN in room.   [name] Mobility Specialist Please contact via SecureChat or Rehab office at [phone]

## 2023-09-04 NOTE — Progress Notes (Signed)
PROGRESS NOTE    [name]  QIH:[id number] DOB: 1966/03/25 DOA: 05/26/2023 PCP: Marcine Matar, MD   Brief Narrative:   57 y.o. 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  He underwent left TMA with wound VAC by Dr. Lajoyce Corners on 06/02/2023.  Therapy recommended home health but patient has no safe disposition. Per sister's report to [facility], "he was staying with some people because he couldn't stay with sister anymore due to violent behaviors. Sister was bringing food and they were letting him stay for free. And now those people are saying he can't come back and sister can't take him either". TOC filed APS report. Now a placement issue. Wound VAC has been removed, sutures removed 9/09.    Assessment & Plan:   Left foot cellulitis with gangrene/osteomyelitis -MRI with osteo of the 2nd toe and plantar muscles suggesting myositis He was seen by the vascular surgeon and there was no indication for vascular intervention. -S/p left TMA and wound VAC placement by Dr. Lajoyce Corners on 7/31. Wound VAC has been removed, sutures removed 9/09.  -Per Ortho, surgical source control achieved.  Antibiotics discontinued. -Analgesics as needed for pain. -Stable for discharge but pending group home placement per TOC.     H/o TBI/cognitive impairment and intermittent aggressive behaviors Depression -Continue Risperdal, Prozac  -Psychiatry evaluation appreciated: Patient does not have capacity   Unsafe living situation: Sister says living in a filthy, unsafe living situation.  Patient with TBI/cognitive impairment.  Has no capacity to make medical decision. -TOC filed APS report.   Controlled DM-2 with hyperglycemia:  -Hemoglobin A1c was 5.8% on 05/27/2023.  Continue metformin and carb and fat diet..   Normocytic anemia: H&H stable, 11.8  today.  Anemia panel suggests anemia of chronic disease.  Monitor intermittently.   Fall in the hospital: No apparent injury or acute focal neurodeficit. -Fall precaution   Seizure disorder  -Continue Keppra    Recurrent hyponatremia: Resolved  Hypertension -Blood pressure stable.  Currently not on any antihypertensives   Hyperlipidemia -Continue statin  GERD -Continue Protonix     DVT prophylaxis: Lovenox Code Status: Full Family Communication: None at bedside Disposition Plan: Status is: Inpatient Remains inpatient appropriate because: Of severity of illness.  Unsafe disposition plan    Consultants: Orthopedic/vascular surgery/psychiatry   Procedures: As above    Antimicrobials:  Ceftriaxone 7/24 - 7/30 Vancomycin 7/26 - 7/30 Metronidazole 7/30 - 7/30  Subjective: Patient seen and examined at bedside.  No chest pain, vomiting or fever reported. Objective: Vitals:   09/03/23 1423 09/03/23 2012 09/04/23 0505 09/04/23 0720  BP: 110/66 127/73 115/71 123/60  Pulse: 73 71 63 78  Resp: 17 16 14 17   Temp: 98.5 F (36.9 C) 98.8 F (37.1 C) 99 F (37.2 C) 98.2 F (36.8 C)  TempSrc: Oral Oral Oral Oral  SpO2: 99% 95% 94% 97%  Weight:      Height:        Intake/Output Summary (Last 24 hours) at 09/04/2023 0738 Last data filed at 09/03/2023 2015 Gross per 24 hour  Intake 600 ml  Output --  Net 600 ml   Filed Weights   05/26/23 2321 09/02/23 0732  Weight: 54.6 kg 63.2 kg    Examination:  General: Currently on room air.  No distress.  Chronically ill and deconditioned looking.  Extremely slow to respond.  Poor historian.  Flat affect. respiratory: Decreased breath sounds at bases with some crackles  CVS: Rate currently controlled; S1 and S2 heard  abdominal: Soft, nontender, has some distention; no organomegaly; normal bowel sounds heard  extremities: Slight lower extremity edema present; no clubbing.  Left foot TMA present   Data Reviewed: I have  personally reviewed following labs and imaging studies  CBC: Recent Labs  Lab 09/01/23 0844  WBC 5.6  HGB 11.8*  HCT 34.2*  MCV 84.7  PLT 252   Basic Metabolic Panel: Recent Labs  Lab 09/01/23 0844  NA 136  K 4.3  CL 103  CO2 23  GLUCOSE 139*  BUN 35*  CREATININE 1.07  CALCIUM 9.2   GFR: Estimated Creatinine Clearance: 65.4 mL/min (by C-G formula based on SCr of 1.07 mg/dL). Liver Function Tests: No results for input(s): "AST", "ALT", "ALKPHOS", "BILITOT", "PROT", "ALBUMIN" in the last 168 hours. No results for input(s): "LIPASE", "AMYLASE" in the last 168 hours. No results for input(s): "AMMONIA" in the last 168 hours. Coagulation Profile: No results for input(s): "INR", "PROTIME" in the last 168 hours. Cardiac Enzymes: No results for input(s): "CKTOTAL", "CKMB", "CKMBINDEX", "TROPONINI" in the last 168 hours. BNP (last 3 results) No results for input(s): "PROBNP" in the last 8760 hours. HbA1C: No results for input(s): "HGBA1C" in the last 72 hours. CBG: No results for input(s): "GLUCAP" in the last 168 hours. Lipid Profile: No results for input(s): "CHOL", "HDL", "LDLCALC", "TRIG", "CHOLHDL", "LDLDIRECT" in the last 72 hours. Thyroid Function Tests: No results for input(s): "TSH", "T4TOTAL", "FREET4", "T3FREE", "THYROIDAB" in the last 72 hours. Anemia Panel: No results for input(s): "VITAMINB12", "FOLATE", "FERRITIN", "TIBC", "IRON", "RETICCTPCT" in the last 72 hours. Sepsis Labs: No results for input(s): "PROCALCITON", "LATICACIDVEN" in the last 168 hours.  No results found for this or any previous visit (from the past 240 hour(s)).       Radiology Studies: No results found.      Scheduled Meds:  vitamin C  250 mg Oral Daily   atorvastatin  10 mg Oral Daily   enoxaparin (LOVENOX) injection  40 mg Subcutaneous Daily   feeding supplement (GLUCERNA SHAKE)  237 mL Oral BID BM   FLUoxetine  30 mg Oral Daily   levETIRAcetam  500 mg Oral BID    metFORMIN  500 mg Oral BID WC   multivitamins with iron  1 tablet Oral Daily   pantoprazole  40 mg Oral Daily   risperiDONE  0.25 mg Oral QHS   Continuous Infusions:        Glade Lloyd, MD [facility] 09/04/2023, 7:38 AM

## 2023-09-04 NOTE — Plan of Care (Signed)
  Problem: Education: Goal: Ability to describe self-care measures that may prevent or decrease complications (Diabetes Survival Skills Education) will improve Outcome: Progressing   Problem: Skin Integrity: Goal: Risk for impaired skin integrity will decrease Outcome: Progressing   Problem: Safety: Goal: Ability to remain free from injury will improve Outcome: Progressing   Problem: Skin Integrity: Goal: Risk for impaired skin integrity will decrease Outcome: Progressing   

## 2023-09-04 NOTE — Progress Notes (Signed)
Mobility Specialist Progress Note:   09/04/23 1414  Therapy Vitals  Temp 98.3 F (36.8 C)  Temp Source Oral  Pulse Rate 75  Resp 17  BP 113/69  Patient Position (if appropriate) Lying  Oxygen Therapy  SpO2 97 %  O2 Device Room Air  Mobility  Activity Ambulated with assistance in hallway  Level of Assistance Contact guard assist, steadying assist  Assistive Device None  Distance Ambulated (ft) 1000 ft  LLE Weight Bearing WBAT  Activity Response Tolerated well  Mobility Referral Yes  $Mobility charge 1 Mobility  Mobility Specialist Start Time (ACUTE ONLY) 1437  Mobility Specialist Stop Time (ACUTE ONLY) 1447  Mobility Specialist Time Calculation (min) (ACUTE ONLY) 10 min    Received pt in bed having no complaints and agreeable to mobility. Pt was asymptomatic throughout ambulation and returned to room w/o fault. Left in bed w/ call bell in reach and all needs met.   D'Vante Earlene Plater Mobility Specialist Please contact via Special educational needs teacher or Rehab office at [phone]

## 2023-09-05 NOTE — Plan of Care (Signed)
Problem: Education: Goal: Ability to describe self-care measures that may prevent or decrease complications (Diabetes Survival Skills Education) will improve Outcome: Not Progressing Goal: Individualized Educational Video(s) Outcome: Not Progressing   Problem: Coping: Goal: Ability to adjust to condition or change in health will improve Outcome: Not Progressing   Problem: Health Behavior/Discharge Planning: Goal: Ability to identify and utilize available resources and services will improve Outcome: Not Progressing Goal: Ability to manage health-related needs will improve Outcome: Not Progressing   Problem: Metabolic: Goal: Ability to maintain appropriate glucose levels will improve Outcome: Not Progressing   Problem: Nutritional: Goal: Progress toward achieving an optimal weight will improve Outcome: Not Progressing   Problem: Skin Integrity: Goal: Risk for impaired skin integrity will decrease Outcome: Not Progressing   Problem: Tissue Perfusion: Goal: Adequacy of tissue perfusion will improve Outcome: Not Progressing   Problem: Education: Goal: Knowledge of General Education information will improve Description: Including pain rating scale, medication(s)/side effects and non-pharmacologic comfort measures Outcome: Not Progressing   Problem: Health Behavior/Discharge Planning: Goal: Ability to manage health-related needs will improve Outcome: Not Progressing   Problem: Clinical Measurements: Goal: Ability to maintain clinical measurements within normal limits will improve Outcome: Not Progressing Goal: Will remain free from infection Outcome: Not Progressing Goal: Diagnostic test results will improve Outcome: Not Progressing Goal: Respiratory complications will improve Outcome: Not Progressing Goal: Cardiovascular complication will be avoided Outcome: Not Progressing   Problem: Activity: Goal: Risk for activity intolerance will decrease Outcome: Not  Progressing   Problem: Nutrition: Goal: Adequate nutrition will be maintained Outcome: Not Progressing   Problem: Coping: Goal: Level of anxiety will decrease Outcome: Not Progressing   Problem: Elimination: Goal: Will not experience complications related to bowel motility Outcome: Not Progressing Goal: Will not experience complications related to urinary retention Outcome: Not Progressing   Problem: Pain Managment: Goal: General experience of comfort will improve Outcome: Not Progressing   Problem: Safety: Goal: Ability to remain free from injury will improve Outcome: Not Progressing   Problem: Skin Integrity: Goal: Risk for impaired skin integrity will decrease Outcome: Not Progressing   Problem: Education: Goal: Knowledge of General Education information will improve Description: Including pain rating scale, medication(s)/side effects and non-pharmacologic comfort measures Outcome: Not Progressing   Problem: Health Behavior/Discharge Planning: Goal: Ability to manage health-related needs will improve Outcome: Not Progressing   Problem: Clinical Measurements: Goal: Ability to maintain clinical measurements within normal limits will improve Outcome: Not Progressing Goal: Will remain free from infection Outcome: Not Progressing Goal: Diagnostic test results will improve Outcome: Not Progressing Goal: Respiratory complications will improve Outcome: Not Progressing Goal: Cardiovascular complication will be avoided Outcome: Not Progressing   Problem: Activity: Goal: Risk for activity intolerance will decrease Outcome: Not Progressing   Problem: Nutrition: Goal: Adequate nutrition will be maintained Outcome: Not Progressing   Problem: Coping: Goal: Level of anxiety will decrease Outcome: Not Progressing   Problem: Elimination: Goal: Will not experience complications related to bowel motility Outcome: Not Progressing Goal: Will not experience complications  related to urinary retention Outcome: Not Progressing   Problem: Pain Managment: Goal: General experience of comfort will improve Outcome: Not Progressing   Problem: Safety: Goal: Ability to remain free from injury will improve Outcome: Not Progressing   Problem: Skin Integrity: Goal: Risk for impaired skin integrity will decrease Outcome: Not Progressing   Problem: Education: Goal: Knowledge of the prescribed therapeutic regimen will improve Outcome: Not Progressing Goal: Ability to verbalize activity precautions or restrictions will improve Outcome: Not Progressing Goal:  Understanding of discharge needs will improve Outcome: Not Progressing   Problem: Activity: Goal: Ability to perform//tolerate increased activity and mobilize with assistive devices will improve Outcome: Not Progressing

## 2023-09-05 NOTE — Plan of Care (Signed)
  Problem: Education: Goal: Ability to describe self-care measures that may prevent or decrease complications (Diabetes Survival Skills Education) will improve Outcome: Progressing Goal: Individualized Educational Video(s) Outcome: Progressing   Problem: Coping: Goal: Ability to adjust to condition or change in health will improve Outcome: Progressing   Problem: Health Behavior/Discharge Planning: Goal: Ability to identify and utilize available resources and services will improve Outcome: Progressing Goal: Ability to manage health-related needs will improve Outcome: Progressing   Problem: Metabolic: Goal: Ability to maintain appropriate glucose levels will improve Outcome: Progressing   Problem: Nutritional: Goal: Progress toward achieving an optimal weight will improve Outcome: Progressing   Problem: Skin Integrity: Goal: Risk for impaired skin integrity will decrease Outcome: Progressing   Problem: Tissue Perfusion: Goal: Adequacy of tissue perfusion will improve Outcome: Progressing   Problem: Education: Goal: Knowledge of General Education information will improve Description: Including pain rating scale, medication(s)/side effects and non-pharmacologic comfort measures Outcome: Progressing   Problem: Health Behavior/Discharge Planning: Goal: Ability to manage health-related needs will improve Outcome: Progressing   Problem: Clinical Measurements: Goal: Ability to maintain clinical measurements within normal limits will improve Outcome: Progressing Goal: Will remain free from infection Outcome: Progressing Goal: Diagnostic test results will improve Outcome: Progressing Goal: Respiratory complications will improve Outcome: Progressing Goal: Cardiovascular complication will be avoided Outcome: Progressing   Problem: Activity: Goal: Risk for activity intolerance will decrease Outcome: Progressing   Problem: Nutrition: Goal: Adequate nutrition will be  maintained Outcome: Progressing   Problem: Coping: Goal: Level of anxiety will decrease Outcome: Progressing   Problem: Elimination: Goal: Will not experience complications related to bowel motility Outcome: Progressing Goal: Will not experience complications related to urinary retention Outcome: Progressing   Problem: Pain Managment: Goal: General experience of comfort will improve Outcome: Progressing   Problem: Safety: Goal: Ability to remain free from injury will improve Outcome: Progressing   Problem: Skin Integrity: Goal: Risk for impaired skin integrity will decrease Outcome: Progressing   Problem: Education: Goal: Knowledge of General Education information will improve Description: Including pain rating scale, medication(s)/side effects and non-pharmacologic comfort measures Outcome: Progressing   Problem: Health Behavior/Discharge Planning: Goal: Ability to manage health-related needs will improve Outcome: Progressing   Problem: Clinical Measurements: Goal: Ability to maintain clinical measurements within normal limits will improve Outcome: Progressing Goal: Will remain free from infection Outcome: Progressing Goal: Diagnostic test results will improve Outcome: Progressing Goal: Respiratory complications will improve Outcome: Progressing Goal: Cardiovascular complication will be avoided Outcome: Progressing   Problem: Activity: Goal: Risk for activity intolerance will decrease Outcome: Progressing   Problem: Nutrition: Goal: Adequate nutrition will be maintained Outcome: Progressing   Problem: Coping: Goal: Level of anxiety will decrease Outcome: Progressing   Problem: Elimination: Goal: Will not experience complications related to bowel motility Outcome: Progressing Goal: Will not experience complications related to urinary retention Outcome: Progressing   Problem: Pain Managment: Goal: General experience of comfort will improve Outcome:  Progressing   Problem: Safety: Goal: Ability to remain free from injury will improve Outcome: Progressing   Problem: Skin Integrity: Goal: Risk for impaired skin integrity will decrease Outcome: Progressing   Problem: Education: Goal: Knowledge of the prescribed therapeutic regimen will improve Outcome: Progressing Goal: Ability to verbalize activity precautions or restrictions will improve Outcome: Progressing Goal: Understanding of discharge needs will improve Outcome: Progressing   Problem: Activity: Goal: Ability to perform//tolerate increased activity and mobilize with assistive devices will improve Outcome: Progressing

## 2023-09-05 NOTE — Progress Notes (Signed)
PROGRESS NOTE    [name]  ZOX:[id number] DOB: 1966/04/15 DOA: 05/26/2023 PCP: Marcine Matar, MD   Brief Narrative:   57 y.o. 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  He underwent left TMA with wound VAC by Dr. Lajoyce Corners on 06/02/2023.  Therapy recommended home health but patient has no safe disposition. Per sister's report to [facility], "he was staying with some people because he couldn't stay with sister anymore due to violent behaviors. Sister was bringing food and they were letting him stay for free. And now those people are saying he can't come back and sister can't take him either". TOC filed APS report. Now a placement issue. Wound VAC has been removed, sutures removed 9/09.    Assessment & Plan:   Left foot cellulitis with gangrene/osteomyelitis -MRI with osteo of the 2nd toe and plantar muscles suggesting myositis He was seen by the vascular surgeon and there was no indication for vascular intervention. -S/p left TMA and wound VAC placement by Dr. Lajoyce Corners on 7/31. Wound VAC has been removed, sutures removed 9/09.  -Per Ortho, surgical source control achieved.  Antibiotics discontinued. -Analgesics as needed for pain. -Stable for discharge but pending group home placement per TOC.     H/o TBI/cognitive impairment and intermittent aggressive behaviors Depression -Continue Risperdal, Prozac  -Psychiatry evaluation appreciated: Patient does not have capacity   Unsafe living situation: Sister says living in a filthy, unsafe living situation.  Patient with TBI/cognitive impairment.  Has no capacity to make medical decision. -TOC filed APS report.   Controlled DM-2 with hyperglycemia:  -Hemoglobin A1c was 5.8% on 05/27/2023.  Continue metformin and carb and fat diet..   Normocytic anemia: H&H stable, 11.8  today.  Anemia panel suggests anemia of chronic disease.  Monitor intermittently.   Fall in the hospital: No apparent injury or acute focal neurodeficit. -Fall precaution   Seizure disorder  -Continue Keppra    Recurrent hyponatremia: Resolved  Hypertension -Blood pressure stable.  Currently not on any antihypertensives   Hyperlipidemia -Continue statin  GERD -Continue Protonix     DVT prophylaxis: Lovenox Code Status: Full Family Communication: None at bedside Disposition Plan: Status is: Inpatient Remains inpatient appropriate because: Of severity of illness.  Unsafe disposition plan    Consultants: Orthopedic/vascular surgery/psychiatry   Procedures: As above    Antimicrobials:  Ceftriaxone 7/24 - 7/30 Vancomycin 7/26 - 7/30 Metronidazole 7/30 - 7/30  Subjective: Patient seen and examined at bedside.  No agitation, seizures, vomiting reported. Objective: Vitals:   09/04/23 0720 09/04/23 1414 09/04/23 2134 09/05/23 0309  BP: 123/60 113/69 128/82 107/61  Pulse: 78 75 68 70  Resp: 17 17 16 16   Temp: 98.2 F (36.8 C) 98.3 F (36.8 C) 97.8 F (36.6 C) 98.5 F (36.9 C)  TempSrc: Oral Oral Oral Oral  SpO2: 97% 97% 96% 98%  Weight:      Height:        Intake/Output Summary (Last 24 hours) at 09/05/2023 0741 Last data filed at 09/05/2023 0310 Gross per 24 hour  Intake 240 ml  Output 925 ml  Net -685 ml   Filed Weights   05/26/23 2321 09/02/23 0732  Weight: 54.6 kg 63.2 kg    Examination:  General: No acute distress.  On room air currently.  Chronically ill and deconditioned looking.  Extremely slow to respond.  Poor historian.  Flat affect. respiratory: Bilateral decreased breath sounds at bases with scattered crackles CVS: S1-S2 heard; rate mostly controlled abdominal: Soft, nontender, distended slightly; no organomegaly; bowel sounds normally heard  extremities: No cyanosis.  Mild lower extremity edema present.  Left foot TMA present   Data  Reviewed: I have personally reviewed following labs and imaging studies  CBC: Recent Labs  Lab 09/01/23 0844  WBC 5.6  HGB 11.8*  HCT 34.2*  MCV 84.7  PLT 252   Basic Metabolic Panel: Recent Labs  Lab 09/01/23 0844  NA 136  K 4.3  CL 103  CO2 23  GLUCOSE 139*  BUN 35*  CREATININE 1.07  CALCIUM 9.2   GFR: Estimated Creatinine Clearance: 65.4 mL/min (by C-G formula based on SCr of 1.07 mg/dL). Liver Function Tests: No results for input(s): "AST", "ALT", "ALKPHOS", "BILITOT", "PROT", "ALBUMIN" in the last 168 hours. No results for input(s): "LIPASE", "AMYLASE" in the last 168 hours. No results for input(s): "AMMONIA" in the last 168 hours. Coagulation Profile: No results for input(s): "INR", "PROTIME" in the last 168 hours. Cardiac Enzymes: No results for input(s): "CKTOTAL", "CKMB", "CKMBINDEX", "TROPONINI" in the last 168 hours. BNP (last 3 results) No results for input(s): "PROBNP" in the last 8760 hours. HbA1C: No results for input(s): "HGBA1C" in the last 72 hours. CBG: No results for input(s): "GLUCAP" in the last 168 hours. Lipid Profile: No results for input(s): "CHOL", "HDL", "LDLCALC", "TRIG", "CHOLHDL", "LDLDIRECT" in the last 72 hours. Thyroid Function Tests: No results for input(s): "TSH", "T4TOTAL", "FREET4", "T3FREE", "THYROIDAB" in the last 72 hours. Anemia Panel: No results for input(s): "VITAMINB12", "FOLATE", "FERRITIN", "TIBC", "IRON", "RETICCTPCT" in the last 72 hours. Sepsis Labs: No results for input(s): "PROCALCITON", "LATICACIDVEN" in the last 168 hours.  No results found for this or any previous visit (from the past 240 hour(s)).       Radiology Studies: No results found.      Scheduled Meds:  vitamin C  250 mg Oral Daily   atorvastatin  10 mg Oral Daily   enoxaparin (LOVENOX) injection  40 mg Subcutaneous Daily   feeding supplement (GLUCERNA SHAKE)  237 mL Oral BID BM   FLUoxetine  30 mg Oral Daily   levETIRAcetam  500 mg  Oral BID   metFORMIN  500 mg Oral BID WC   multivitamins with iron  1 tablet Oral Daily   pantoprazole  40 mg Oral Daily   risperiDONE  0.25 mg Oral QHS   Continuous Infusions:        Glade Lloyd, MD [facility] 09/05/2023, 7:41 AM

## 2023-09-05 NOTE — Plan of Care (Signed)

## 2023-09-06 NOTE — Plan of Care (Signed)
  Problem: Education: Goal: Ability to describe self-care measures that may prevent or decrease complications (Diabetes Survival Skills Education) will improve Outcome: Progressing   Problem: Coping: Goal: Ability to adjust to condition or change in health will improve Outcome: Progressing   Problem: Health Behavior/Discharge Planning: Goal: Ability to identify and utilize available resources and services will improve Outcome: Progressing Goal: Ability to manage health-related needs will improve Outcome: Progressing   Problem: Nutritional: Goal: Progress toward achieving an optimal weight will improve Outcome: Progressing   Problem: Skin Integrity: Goal: Risk for impaired skin integrity will decrease Outcome: Progressing   Problem: Tissue Perfusion: Goal: Adequacy of tissue perfusion will improve Outcome: Progressing   Problem: Education: Goal: Knowledge of General Education information will improve Description: Including pain rating scale, medication(s)/side effects and non-pharmacologic comfort measures Outcome: Progressing   Problem: Health Behavior/Discharge Planning: Goal: Ability to manage health-related needs will improve Outcome: Progressing   Problem: Clinical Measurements: Goal: Ability to maintain clinical measurements within normal limits will improve Outcome: Progressing   Problem: Activity: Goal: Risk for activity intolerance will decrease Outcome: Progressing   Problem: Nutrition: Goal: Adequate nutrition will be maintained Outcome: Progressing   Problem: Coping: Goal: Level of anxiety will decrease Outcome: Progressing   Problem: Safety: Goal: Ability to remain free from injury will improve Outcome: Progressing

## 2023-09-06 NOTE — Progress Notes (Signed)
PROGRESS NOTE    [name]  GNF:[id number] DOB: November 07, 1965 DOA: 05/26/2023 PCP: Jamie Matar, MD   Brief Narrative:   57 y.o. 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  He underwent left TMA with wound VAC by Dr. Lajoyce Berg on 06/02/2023.  Therapy recommended home health but patient has no safe disposition. Per sister's report to [facility], "he was staying with some people because he couldn't stay with sister anymore due to violent behaviors. Sister was bringing food and they were letting him stay for free. And now those people are saying he can't come back and sister can't take him either". TOC filed APS report. Now a placement issue. Wound VAC has been removed, sutures removed 9/09.    Assessment & Plan:   Left foot cellulitis with gangrene/osteomyelitis -MRI with osteo of the 2nd toe and plantar muscles suggesting myositis He was seen by the vascular surgeon and there was no indication for vascular intervention. -S/p left TMA and wound VAC placement by Dr. Lajoyce Berg on 7/31. Wound VAC has been removed, sutures removed 9/09.  -Per Ortho, surgical source control achieved.  Antibiotics discontinued. -Analgesics as needed for pain. -Stable for discharge but pending group home placement per TOC.     H/o TBI/cognitive impairment and intermittent aggressive behaviors Depression -Continue Risperdal, Prozac  -Psychiatry evaluation appreciated: Patient does not have capacity   Unsafe living situation: Sister says living in a filthy, unsafe living situation.  Patient with TBI/cognitive impairment.  Has no capacity to make medical decision. -TOC filed APS report.   Controlled DM-2 with hyperglycemia:  -Hemoglobin A1c was 5.8% on 05/27/2023.  Continue metformin and carb and fat diet..   Normocytic anemia: H&H stable, 11.8  today.  Anemia panel suggests anemia of chronic disease.  Monitor intermittently.   Fall in the hospital: No apparent injury or acute focal neurodeficit. -Fall precaution   Seizure disorder  -Continue Keppra    Recurrent hyponatremia: Resolved  Hypertension -Blood pressure stable.  Currently not on any antihypertensives   Hyperlipidemia -Continue statin  GERD -Continue Protonix     DVT prophylaxis: Lovenox Code Status: Full Family Communication: None at bedside Disposition Plan: Status is: Inpatient Remains inpatient appropriate because: Of severity of illness.  Unsafe disposition plan    Consultants: Orthopedic/vascular surgery/psychiatry   Procedures: As above    Antimicrobials:  Ceftriaxone 7/24 - 7/30 Vancomycin 7/26 - 7/30 Metronidazole 7/30 - 7/30  Subjective: Patient seen and examined at bedside.  No fever, vomiting, agitation reported.   Objective: Vitals:   09/05/23 0309 09/05/23 0758 09/05/23 1928 09/06/23 0429  BP: 107/61 115/65 116/69 105/65  Pulse: 70 68 75 66  Resp: 16 18 18 19   Temp: 98.5 F (36.9 C) 98.6 F (37 C) 98.1 F (36.7 C) 98.3 F (36.8 C)  TempSrc: Oral Oral Oral Oral  SpO2: 98% 98% 98% 97%  Weight:      Height:        Intake/Output Summary (Last 24 hours) at 09/06/2023 0717 Last data filed at 09/05/2023 2100 Gross per 24 hour  Intake --  Output 250 ml  Net -250 ml   Filed Weights   05/26/23 2321 09/02/23 0732  Weight: 54.6 kg 63.2 kg    Examination:  General: On room air.  No distress.  Chronically ill and deconditioned looking.  Extremely slow to respond.  Poor historian.  Flat affect. respiratory: Decreased breath sounds at bases bilaterally with some crackles  CVS: Currently rate controlled; S1 and S2 are heard abdominal: Soft, nontender, mildly distended; no organomegaly; normal bowel sounds heard  extremities: Trace lower extremity edema present; no clubbing.  Left foot TMA present   Data Reviewed: I have  personally reviewed following labs and imaging studies  CBC: Recent Labs  Lab 09/01/23 0844  WBC 5.6  HGB 11.8*  HCT 34.2*  MCV 84.7  PLT 252   Basic Metabolic Panel: Recent Labs  Lab 09/01/23 0844  NA 136  K 4.3  CL 103  CO2 23  GLUCOSE 139*  BUN 35*  CREATININE 1.07  CALCIUM 9.2   GFR: Estimated Creatinine Clearance: 65.4 mL/min (by C-G formula based on SCr of 1.07 mg/dL). Liver Function Tests: No results for input(s): "AST", "ALT", "ALKPHOS", "BILITOT", "PROT", "ALBUMIN" in the last 168 hours. No results for input(s): "LIPASE", "AMYLASE" in the last 168 hours. No results for input(s): "AMMONIA" in the last 168 hours. Coagulation Profile: No results for input(s): "INR", "PROTIME" in the last 168 hours. Cardiac Enzymes: No results for input(s): "CKTOTAL", "CKMB", "CKMBINDEX", "TROPONINI" in the last 168 hours. BNP (last 3 results) No results for input(s): "PROBNP" in the last 8760 hours. HbA1C: No results for input(s): "HGBA1C" in the last 72 hours. CBG: No results for input(s): "GLUCAP" in the last 168 hours. Lipid Profile: No results for input(s): "CHOL", "HDL", "LDLCALC", "TRIG", "CHOLHDL", "LDLDIRECT" in the last 72 hours. Thyroid Function Tests: No results for input(s): "TSH", "T4TOTAL", "FREET4", "T3FREE", "THYROIDAB" in the last 72 hours. Anemia Panel: No results for input(s): "VITAMINB12", "FOLATE", "FERRITIN", "TIBC", "IRON", "RETICCTPCT" in the last 72 hours. Sepsis Labs: No results for input(s): "PROCALCITON", "LATICACIDVEN" in the last 168 hours.  No results found for this or any previous visit (from the past 240 hour(s)).       Radiology Studies: No results found.      Scheduled Meds:  vitamin C  250 mg Oral Daily   atorvastatin  10 mg Oral Daily   enoxaparin (LOVENOX) injection  40 mg Subcutaneous Daily   feeding supplement (GLUCERNA SHAKE)  237 mL Oral BID BM   FLUoxetine  30 mg Oral Daily   levETIRAcetam  500 mg Oral BID    metFORMIN  500 mg Oral BID WC   multivitamins with iron  1 tablet Oral Daily   pantoprazole  40 mg Oral Daily   risperiDONE  0.25 mg Oral QHS   Continuous Infusions:        Jamie Lloyd, MD [facility] 09/06/2023, 7:17 AM

## 2023-09-06 NOTE — Progress Notes (Signed)
Mobility Specialist: Progress Note   09/06/23 1014  Mobility  Activity Ambulated with assistance in hallway  Level of Assistance Standby assist, set-up cues, supervision of patient - no hands on  Assistive Device None  Distance Ambulated (ft) 550 ft  LLE Weight Bearing WBAT  Activity Response Tolerated well  Mobility Referral Yes  $Mobility charge 1 Mobility  Mobility Specialist Start Time (ACUTE ONLY) [id number]  Mobility Specialist Stop Time (ACUTE ONLY) 0957  Mobility Specialist Time Calculation (min) (ACUTE ONLY) 8 min    Pt was agreeable to mobility session - received in bed. SV throughout. Able to don/doff shoes independently. Practiced cross body reaching activity during ambulation - tolerated well, required few verbal cues to reach with opposite hand and continue walking throughout activity. Returned to room without fault. Left in bed with all needs met, call bell in reach.   [name] Mobility Specialist Please contact via SecureChat or Rehab office at [phone]

## 2023-09-07 MED ORDER — CARMEX CLASSIC LIP BALM EX OINT
TOPICAL_OINTMENT | CUTANEOUS | Status: DC | PRN
  Filled 2023-09-07: qty 10

## 2023-09-07 NOTE — Progress Notes (Addendum)
PROGRESS NOTE    [name]  EXB:[id number] DOB: 05-Jul-1966 DOA: 05/26/2023 PCP: Marcine Matar, MD   Brief Narrative:   57 y.o. 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  He underwent left TMA with wound VAC by Dr. Lajoyce Corners on 06/02/2023.  Therapy recommended home health but patient has no safe disposition. Per sister's report to [facility], "he was staying with some people because he couldn't stay with sister anymore due to violent behaviors. Sister was bringing food and they were letting him stay for free. And now those people are saying he can't come back and sister can't take him either". TOC filed APS report. Now a placement issue. Wound VAC has been removed, sutures removed 9/09.    Assessment & Plan:   Left foot cellulitis with gangrene/osteomyelitis -MRI with osteo of the 2nd toe and plantar muscles suggesting myositis He was seen by the vascular surgeon and there was no indication for vascular intervention. -S/p left TMA and wound VAC placement by Dr. Lajoyce Corners on 7/31. Wound VAC has been removed, sutures removed 9/09.  -Per Ortho, surgical source control achieved.  Antibiotics discontinued. -Analgesics as needed for pain. -Stable for discharge but pending group home placement per TOC.     H/o TBI/cognitive impairment and intermittent aggressive behaviors Depression -Continue Risperdal, Prozac  -Psychiatry evaluation appreciated: Patient does not have capacity   Unsafe living situation: Sister says living in a filthy, unsafe living situation.  Patient with TBI/cognitive impairment.  Has no capacity to make medical decision. -TOC filed APS report.   Controlled DM-2 with hyperglycemia:  -Hemoglobin A1c was 5.8% on 05/27/2023.  Continue metformin and carb modified diet   Normocytic anemia: H&H stable, 11.8  today.  Anemia panel suggests anemia of chronic disease.  Monitor intermittently.   Fall in the hospital: No apparent injury or acute focal neurodeficit. -Fall precaution   Seizure disorder  -Continue Keppra    Recurrent hyponatremia: Resolved  Hypertension -Blood pressure stable.  Currently not on any antihypertensives   Hyperlipidemia -Continue statin  GERD -Continue Protonix     DVT prophylaxis: Lovenox Code Status: Full Family Communication: None at bedside Disposition Plan: Status is: Inpatient Remains inpatient appropriate because: Of severity of illness.  Unsafe disposition plan    Consultants: Orthopedic/vascular surgery/psychiatry   Procedures: As above    Antimicrobials:  Ceftriaxone 7/24 - 7/30 Vancomycin 7/26 - 7/30 Metronidazole 7/30 - 7/30  Subjective: Patient seen and examined at bedside.  No agitation, fever or vomiting reported. Objective: Vitals:   09/06/23 1404 09/06/23 1920 09/07/23 0511 09/07/23 0707  BP: 115/72 112/68 (!) 113/59 123/69  Pulse: 70 75 70 73  Resp: 18 18 18 18   Temp: 98.2 F (36.8 C) 98 F (36.7 C) 98.9 F (37.2 C) 98.5 F (36.9 C)  TempSrc: Oral Oral Oral Oral  SpO2: 98% 97% 97% 95%  Weight:      Height:       No intake or output data in the 24 hours ending 09/07/23 0727  Filed Weights   05/26/23 2321 09/02/23 0732  Weight: 54.6 kg 63.2 kg    Examination:  General: No acute distress.  Remains on room air.  Chronically ill and deconditioned looking.  Extremely slow to respond.  Poor historian.  Flat affect. respiratory: Decreased breath sounds at bases with scattered crackles  CVS: S1-S2 heard;  rate mostly controlled abdominal: Soft, nontender, distended slightly; no organomegaly; bowel sounds normally heard  extremities: No cyanosis.  Mild lower extremity edema present.  Left foot TMA present   Data Reviewed: I have personally reviewed following labs and imaging studies  CBC: Recent Labs  Lab  09/01/23 0844  WBC 5.6  HGB 11.8*  HCT 34.2*  MCV 84.7  PLT 252   Basic Metabolic Panel: Recent Labs  Lab 09/01/23 0844  NA 136  K 4.3  CL 103  CO2 23  GLUCOSE 139*  BUN 35*  CREATININE 1.07  CALCIUM 9.2   GFR: Estimated Creatinine Clearance: 65.4 mL/min (by C-G formula based on SCr of 1.07 mg/dL). Liver Function Tests: No results for input(s): "AST", "ALT", "ALKPHOS", "BILITOT", "PROT", "ALBUMIN" in the last 168 hours. No results for input(s): "LIPASE", "AMYLASE" in the last 168 hours. No results for input(s): "AMMONIA" in the last 168 hours. Coagulation Profile: No results for input(s): "INR", "PROTIME" in the last 168 hours. Cardiac Enzymes: No results for input(s): "CKTOTAL", "CKMB", "CKMBINDEX", "TROPONINI" in the last 168 hours. BNP (last 3 results) No results for input(s): "PROBNP" in the last 8760 hours. HbA1C: No results for input(s): "HGBA1C" in the last 72 hours. CBG: No results for input(s): "GLUCAP" in the last 168 hours. Lipid Profile: No results for input(s): "CHOL", "HDL", "LDLCALC", "TRIG", "CHOLHDL", "LDLDIRECT" in the last 72 hours. Thyroid Function Tests: No results for input(s): "TSH", "T4TOTAL", "FREET4", "T3FREE", "THYROIDAB" in the last 72 hours. Anemia Panel: No results for input(s): "VITAMINB12", "FOLATE", "FERRITIN", "TIBC", "IRON", "RETICCTPCT" in the last 72 hours. Sepsis Labs: No results for input(s): "PROCALCITON", "LATICACIDVEN" in the last 168 hours.  No results found for this or any previous visit (from the past 240 hour(s)).       Radiology Studies: No results found.      Scheduled Meds:  vitamin C  250 mg Oral Daily   atorvastatin  10 mg Oral Daily   enoxaparin (LOVENOX) injection  40 mg Subcutaneous Daily   feeding supplement (GLUCERNA SHAKE)  237 mL Oral BID BM   FLUoxetine  30 mg Oral Daily   levETIRAcetam  500 mg Oral BID   metFORMIN  500 mg Oral BID WC   multivitamins with iron  1 tablet Oral Daily    pantoprazole  40 mg Oral Daily   risperiDONE  0.25 mg Oral QHS   Continuous Infusions:        Glade Lloyd, MD [facility] 09/07/2023, 7:27 AM

## 2023-09-07 NOTE — Plan of Care (Signed)
Problem: Education: Goal: Ability to describe self-care measures that may prevent or decrease complications (Diabetes Survival Skills Education) will improve Outcome: Not Progressing Goal: Individualized Educational Video(s) Outcome: Not Progressing   Problem: Coping: Goal: Ability to adjust to condition or change in health will improve Outcome: Not Progressing   Problem: Health Behavior/Discharge Planning: Goal: Ability to identify and utilize available resources and services will improve Outcome: Not Progressing Goal: Ability to manage health-related needs will improve Outcome: Not Progressing   Problem: Metabolic: Goal: Ability to maintain appropriate glucose levels will improve Outcome: Not Progressing   Problem: Nutritional: Goal: Progress toward achieving an optimal weight will improve Outcome: Not Progressing   Problem: Skin Integrity: Goal: Risk for impaired skin integrity will decrease Outcome: Not Progressing   Problem: Tissue Perfusion: Goal: Adequacy of tissue perfusion will improve Outcome: Not Progressing   Problem: Education: Goal: Knowledge of General Education information will improve Description: Including pain rating scale, medication(s)/side effects and non-pharmacologic comfort measures Outcome: Not Progressing   Problem: Health Behavior/Discharge Planning: Goal: Ability to manage health-related needs will improve Outcome: Not Progressing   Problem: Clinical Measurements: Goal: Ability to maintain clinical measurements within normal limits will improve Outcome: Not Progressing Goal: Will remain free from infection Outcome: Not Progressing Goal: Diagnostic test results will improve Outcome: Not Progressing Goal: Respiratory complications will improve Outcome: Not Progressing Goal: Cardiovascular complication will be avoided Outcome: Not Progressing   Problem: Activity: Goal: Risk for activity intolerance will decrease Outcome: Not  Progressing   Problem: Nutrition: Goal: Adequate nutrition will be maintained Outcome: Not Progressing   Problem: Coping: Goal: Level of anxiety will decrease Outcome: Not Progressing   Problem: Elimination: Goal: Will not experience complications related to bowel motility Outcome: Not Progressing Goal: Will not experience complications related to urinary retention Outcome: Not Progressing   Problem: Pain Managment: Goal: General experience of comfort will improve Outcome: Not Progressing   Problem: Safety: Goal: Ability to remain free from injury will improve Outcome: Not Progressing   Problem: Skin Integrity: Goal: Risk for impaired skin integrity will decrease Outcome: Not Progressing   Problem: Education: Goal: Knowledge of General Education information will improve Description: Including pain rating scale, medication(s)/side effects and non-pharmacologic comfort measures Outcome: Not Progressing   Problem: Health Behavior/Discharge Planning: Goal: Ability to manage health-related needs will improve Outcome: Not Progressing   Problem: Clinical Measurements: Goal: Ability to maintain clinical measurements within normal limits will improve Outcome: Not Progressing Goal: Will remain free from infection Outcome: Not Progressing Goal: Diagnostic test results will improve Outcome: Not Progressing Goal: Respiratory complications will improve Outcome: Not Progressing Goal: Cardiovascular complication will be avoided Outcome: Not Progressing   Problem: Activity: Goal: Risk for activity intolerance will decrease Outcome: Not Progressing   Problem: Nutrition: Goal: Adequate nutrition will be maintained Outcome: Not Progressing   Problem: Coping: Goal: Level of anxiety will decrease Outcome: Not Progressing   Problem: Elimination: Goal: Will not experience complications related to bowel motility Outcome: Not Progressing Goal: Will not experience complications  related to urinary retention Outcome: Not Progressing   Problem: Pain Managment: Goal: General experience of comfort will improve Outcome: Not Progressing   Problem: Safety: Goal: Ability to remain free from injury will improve Outcome: Not Progressing   Problem: Skin Integrity: Goal: Risk for impaired skin integrity will decrease Outcome: Not Progressing   Problem: Education: Goal: Knowledge of the prescribed therapeutic regimen will improve Outcome: Not Progressing Goal: Ability to verbalize activity precautions or restrictions will improve Outcome: Not Progressing Goal:  Understanding of discharge needs will improve Outcome: Not Progressing   Problem: Activity: Goal: Ability to perform//tolerate increased activity and mobilize with assistive devices will improve Outcome: Not Progressing

## 2023-09-07 NOTE — Progress Notes (Signed)
Mobility Specialist: Progress Note   09/07/23 1340  Mobility  Activity Ambulated with assistance in hallway  Level of Assistance Standby assist, set-up cues, supervision of patient - no hands on  Assistive Device None  Distance Ambulated (ft) 550 ft  LLE Weight Bearing WBAT  Activity Response Tolerated well  Mobility Referral Yes  $Mobility charge 1 Mobility  Mobility Specialist Start Time (ACUTE ONLY) 0957  Mobility Specialist Stop Time (ACUTE ONLY) 1006  Mobility Specialist Time Calculation (min) (ACUTE ONLY) 9 min    Pt was agreeable to mobility session - received in bed. Interpreter Bonnye Fava was present and helpful throughout session. SV throughout. Able to don/doff shoes independently. Practiced cross body reaching activity during ambulation - tolerated well, required few verbal cues to reach with opposite hand and continue walking throughout activity. Returned to room without fault. Left in bed with all needs met, call bell in reach.   [name] Mobility Specialist Please contact via SecureChat or Rehab office at [phone]

## 2023-09-07 NOTE — Plan of Care (Signed)
  Problem: Education: Goal: Knowledge of General Education information will improve Description: Including pain rating scale, medication(s)/side effects and non-pharmacologic comfort measures Outcome: Progressing   Problem: Coping: Goal: Level of anxiety will decrease Outcome: Progressing   

## 2023-09-07 NOTE — TOC Progression Note (Signed)
Transition of Care [facility]) - Progression Note    Patient Details  Name: [name] MRN: [id number] Date of Birth: 1965-11-04  Transition of Care [facility]) CM/SW Contact  Lorri Frederick, LCSW Phone Number: 09/07/2023, 2:32 PM  Clinical Narrative:  CSW received email from Angela/APS that they have secured placement at [facility].  CSW spoke with [name] by phone and updated her.   1130: CSW received call from [facility]: [phone] with a number of questions and request for new FL2.  1430: FL2 emailed.     Expected Discharge Plan: Group Home Barriers to Discharge: Other (must enter comment) (Adult protective services is working on placement)  Expected Discharge Plan and Services       Living arrangements for the past 2 months: Single Family Home                                       Social Determinants of Health (SDOH) Interventions SDOH Screenings   Depression (PHQ2-9): Low Risk  (03/11/2023)  Tobacco Use: Low Risk  (06/02/2023)    Readmission Risk Interventions     No data to display

## 2023-09-07 NOTE — NC FL2 (Signed)
[place] MEDICAID FL2 LEVEL OF CARE FORM     IDENTIFICATION  Patient Name: [name] Birthdate: 1966/09/19 Sex: male Admission Date (Current Location): 05/26/2023  [facility] and [place] Number:  Producer, television/film/video and Address:  [facility] [place], [address], [place], [place] [address]      Provider Number: [id number]  Attending Physician Name and Address:  Glade Lloyd, MD  Relative Name and Phone Number:  Waylan Boga [phone]    Current Level of Care: Hospital Recommended Level of Care: Assisted Living Facility [facility] [place]) Prior Approval Number:    Date Approved/Denied:   PASRR Number:    Discharge Plan: Other (Comment) ([facility])    Current Diagnoses: Patient Active Problem List   Diagnosis Date Noted   Cutaneous abscess of left foot 05/27/2023   PAD (peripheral artery disease) (HCC) 05/27/2023   Severe protein-calorie malnutrition (HCC) 05/27/2023   Cellulitis of left foot 05/26/2023   Burnout of caregiver 07/09/2022   Aggressive behavior of adult 07/09/2022   Gastric polyp    Gastritis and gastroduodenitis    History of traumatic brain injury 05/29/2021   MDD (major depressive disorder), recurrent, in partial remission (HCC) 11/12/2020   MDD (major depressive disorder), recurrent episode, moderate (HCC) 07/16/2020   Influenza vaccine needed 07/14/2019   Microalbuminuria due to type 2 diabetes mellitus (HCC) 04/11/2019   Hypothyroidism 09/21/2018   Seizure disorder (HCC) 06/16/2018   Low testosterone 06/16/2018   Anemia, chronic disease 10/29/2017   Positive depression screening 10/29/2017   Attention and concentration deficit    Type 2 diabetes mellitus with peripheral neuropathy (HCC)    S/P craniotomy 03/19/2017   Essential hypertension    Dysphagia, oropharyngeal 02/01/2017   Diabetes type 2, uncontrolled 06/13/2015    Orientation RESPIRATION BLADDER Height & Weight      Self  Normal Continent Weight: 139 lb 4.8 oz (63.2 kg) Height:  5' 4.96" (165 cm)  BEHAVIORAL SYMPTOMS/MOOD NEUROLOGICAL BOWEL NUTRITION STATUS      Continent Diet (diabetic diet)  AMBULATORY STATUS COMMUNICATION OF NEEDS Skin   Supervision Verbally Normal                       Personal Care Assistance Level of Assistance  Bathing, Feeding, Dressing Bathing Assistance: Independent (with supervision) Feeding assistance: Independent Dressing Assistance: Independent     Functional Limitations Info  Sight, Hearing, Speech Sight Info: Adequate Hearing Info: Adequate Speech Info: Adequate    SPECIAL CARE FACTORS FREQUENCY  PT (By licensed PT)     PT Frequency: evaluate and treat: outpt OT Frequency: none            Contractures Contractures Info: Not present    Additional Factors Info  Code Status, Allergies Code Status Info: full Allergies Info: latex   Insulin Sliding Scale Info: novolog       Current Medications (09/07/2023):  This is the current hospital active medication list Current Facility-Administered Medications  Medication Dose Route Frequency Provider Last Rate Last Admin   acetaminophen (TYLENOL) tablet 650 mg  650 mg Oral Q6H PRN [name], MD   650 mg at 09/05/23 2100   Or   acetaminophen (TYLENOL) suppository 650 mg  650 mg Rectal Q6H PRN [name], MD       alum & mag hydroxide-simeth (MAALOX/MYLANTA) 200-200-20 MG/5ML suspension 15-30 mL  15-30 mL Oral Q2H PRN [name], MD       ascorbic  acid (VITAMIN C) tablet 250 mg  250 mg Oral Daily [place], Alvira Philips, DO   250 mg at 09/07/23 0900   atorvastatin (LIPITOR) tablet 10 mg  10 mg Oral Daily [name], MD   10 mg at 09/07/23 0900   bisacodyl (DULCOLAX) EC tablet 5 mg  5 mg Oral Daily PRN [name], MD       enoxaparin (LOVENOX) injection 40 mg  40 mg Subcutaneous Daily [name], MD   40 mg at 09/07/23 0900   feeding supplement (GLUCERNA SHAKE) (GLUCERNA SHAKE) liquid  237 mL  237 mL Oral BID BM Kathlen Mody, MD   237 mL at 09/07/23 1400   FLUoxetine (PROZAC) capsule 30 mg  30 mg Oral Daily [name], MD   30 mg at 09/07/23 0900   levETIRAcetam (KEPPRA) tablet 500 mg  500 mg Oral BID [name], MD   500 mg at 09/07/23 0900   loperamide (IMODIUM) capsule 2 mg  2 mg Oral PRN Candelaria Stagers T, MD   2 mg at 06/10/23 1549   metFORMIN (GLUCOPHAGE) tablet 500 mg  500 mg Oral BID WC Candelaria Stagers T, MD   500 mg at 09/07/23 0810   multivitamins with iron tablet 1 tablet  1 tablet Oral Daily Candelaria Stagers T, MD   1 tablet at 09/07/23 0900   ondansetron (ZOFRAN) tablet 4 mg  4 mg Oral Q6H PRN [name], MD       Or   ondansetron [facility]) injection 4 mg  4 mg Intravenous Q6H PRN [name], MD       Oral care mouth rinse  15 mL Mouth Rinse PRN Leatha Gilding, MD       pantoprazole (PROTONIX) EC tablet 40 mg  40 mg Oral Daily [name], MD   40 mg at 09/07/23 0900   polyethylene glycol (MIRALAX / GLYCOLAX) packet 17 g  17 g Oral Daily PRN [name], MD   17 g at 07/30/23 0914   risperiDONE (RISPERDAL) tablet 0.25 mg  0.25 mg Oral QHS [name], MD   0.25 mg at 09/06/23 2230     Discharge Medications: Please see discharge summary for a list of discharge medications.  Relevant Imaging Results:  Relevant Lab Results:   Additional Information SSN: [id number]  Lorri Frederick, LCSW

## 2023-09-07 NOTE — Progress Notes (Signed)
Physical Therapy Treatment Patient Details Name: [name] MRN: [id number] DOB: 30-May-1966 Today's Date: 09/07/2023   History of Present Illness Pt is a 57 year old male admitted for evaluation of being unable to get up and ambulate, and not eating for 2 days, admitted for left foot gangrene with osteomyelitis. 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    PT Comments  Pt tolerated treatment well today. Session today focused on stairs and dynamic gait training which pt was able to perform with supervision. No change in DC/DME recs at this time.  PT will continue to follow.    If plan is discharge home, recommend the following: A little help with walking and/or transfers;A little help with bathing/dressing/bathroom;Assistance with cooking/housework;Direct supervision/assist for medications management;Assist for transportation;Help with stairs or ramp for entrance   Can travel by private vehicle     No  Equipment Recommendations  None recommended by PT    Recommendations for Other Services       Precautions / Restrictions Precautions Precautions: Fall;Other (comment) Precaution Comments: History of TBI with left side weakness and cognitive impairments Restrictions Weight Bearing Restrictions: No LLE Weight Bearing: Weight bearing as tolerated     Mobility  Bed Mobility Overal bed mobility: Modified Independent                  Transfers Overall transfer level: Needs assistance Equipment used: None Transfers: Sit to/from Stand Sit to Stand: Supervision           General transfer comment: steadies using very large BoS    Ambulation/Gait Ambulation/Gait assistance: Supervision Gait Distance (Feet): 500 Feet Assistive device: None Gait Pattern/deviations: Shuffle, Staggering left, Staggering right, Wide base of support Gait velocity: sometimes to fast for  conditions     General Gait Details: pt with both of his sneakers on today, and while he continues to have a very wide BoS he has more fluidity with his ambulation and better reciprocal arm and leg motion. focus on dynamic balance task with gait, no overt LOB but pt at times staggering or stopping to complete task (No change from previous session)   Stairs Stairs: Yes Stairs assistance: Contact guard assist Stair Management: Two rails, One rail Right, Alternating pattern, Forwards Number of Stairs: 6 General stair comments: 2 steps with bilateral rails, 4 steps with single rail. Pt cued for no rails however pt did not follow command.   Wheelchair Mobility     Tilt Bed    Modified Rankin (Stroke Patients Only)       Balance Overall balance assessment: Needs assistance Sitting-balance support: No upper extremity supported, Feet supported Sitting balance-Leahy Scale: Normal Sitting balance - Comments: sitting on EOB   Standing balance support: Single extremity supported, During functional activity Standing balance-Leahy Scale: Good Standing balance comment: looks for support but is able to stabilize with both static and dynamic balance at a contact guard level                            Cognition Arousal: Alert Behavior During Therapy: Impulsive Overall Cognitive Status: History of cognitive impairments - at baseline                                 General Comments: hx of cognitive impairment, impulsive, requires cues for safety and to ask for  assistance. needs repeated cues to maintain corrections. very poor STM in regards to multi-step command following and application of instructions to functional task        Exercises      General Comments General comments (skin integrity, edema, etc.): VSS      Pertinent Vitals/Pain Pain Assessment Pain Assessment: No/denies pain    Home Living                          Prior Function             PT Goals (current goals can now be found in the care plan section) Progress towards PT goals: Progressing toward goals    Frequency    Min 1X/week      PT Plan Current plan remains appropriate    Co-evaluation              AM-PAC PT "6 Clicks" Mobility   Outcome Measure  Help needed turning from your back to your side while in a flat bed without using bedrails?: None Help needed moving from lying on your back to sitting on the side of a flat bed without using bedrails?: None Help needed moving to and from a bed to a chair (including a wheelchair)?: A Little Help needed standing up from a chair using your arms (e.g., wheelchair or bedside chair)?: A Little Help needed to walk in hospital room?: A Little Help needed climbing 3-5 steps with a railing? : A Little 6 Click Score: 20    End of Session Equipment Utilized During Treatment: Gait belt Activity Tolerance: Patient tolerated treatment well Patient left: with call bell/phone within reach;in chair;with chair alarm set;with family/visitor present Nurse Communication: Mobility status PT Visit Diagnosis: Other abnormalities of gait and mobility (R26.89)     Time: 2956-2130 PT Time Calculation (min) (ACUTE ONLY): 10 min  Charges:    $Gait Training: 8-22 mins PT General Charges $$ ACUTE PT VISIT: 1 Visit                     Shela Nevin, PT, DPT Acute Rehab Services [phone]    [name] 09/07/2023, 4:02 PM

## 2023-09-08 NOTE — NC FL2 (Signed)
[place] MEDICAID FL2 LEVEL OF CARE FORM     IDENTIFICATION  Patient Name: [name] Birthdate: [birth year] Sex: male Admission Date (Current Location): 05/26/2023  [facility] and [place] Number:  Producer, television/film/video and Address:  [facility], [address], [place], [place] [address]      Provider Number: [id number]  Attending Physician Name and Address:  Meredeth Ide, MD  Relative Name and Phone Number:  Waylan Boga [phone]    Current Level of Care: Hospital Recommended Level of Care: Assisted Living Facility [facility]) Prior Approval Number:    Date Approved/Denied:   PASRR Number:    Discharge Plan: Other (Comment) ([facility])    Current Diagnoses: Patient Active Problem List   Diagnosis Date Noted   Cutaneous abscess of left foot 05/27/2023   PAD (peripheral artery disease) (HCC) 05/27/2023   Severe protein-calorie malnutrition (HCC) 05/27/2023   Cellulitis of left foot 05/26/2023   Burnout of caregiver 07/09/2022   Aggressive behavior of adult 07/09/2022   Gastric polyp    Gastritis and gastroduodenitis    History of traumatic brain injury 05/29/2021   MDD (major depressive disorder), recurrent, in partial remission (HCC) 11/12/2020   MDD (major depressive disorder), recurrent episode, moderate (HCC) 07/16/2020   Influenza vaccine needed 07/14/2019   Microalbuminuria due to type 2 diabetes mellitus (HCC) 04/11/2019   Hypothyroidism 09/21/2018   Seizure disorder (HCC) 06/16/2018   Low testosterone 06/16/2018   Anemia, chronic disease 10/29/2017   Positive depression screening 10/29/2017   Attention and concentration deficit    Type 2 diabetes mellitus with peripheral neuropathy (HCC)    S/P craniotomy 03/19/2017   Essential hypertension    Dysphagia, oropharyngeal 02/01/2017   Diabetes type 2, uncontrolled 06/13/2015    Orientation RESPIRATION BLADDER Height & Weight      Self  Normal Continent Weight: 139 lb 4.8 oz (63.2 kg) Height:  5' 4.96" (165 cm)  BEHAVIORAL SYMPTOMS/MOOD NEUROLOGICAL BOWEL NUTRITION STATUS      Continent Diet (diabetic diet)  AMBULATORY STATUS COMMUNICATION OF NEEDS Skin   Supervision Verbally Normal                       Personal Care Assistance Level of Assistance  Bathing, Feeding, Dressing Bathing Assistance: Independent (with supervision) Feeding assistance: Independent Dressing Assistance: Independent     Functional Limitations Info  Sight, Hearing, Speech Sight Info: Adequate Hearing Info: Adequate Speech Info: Adequate    SPECIAL CARE FACTORS FREQUENCY  PT (By licensed PT)     PT Frequency: evaluate and treat: outpt OT Frequency: none            Contractures Contractures Info: Not present    Additional Factors Info  Code Status, Allergies Code Status Info: full Allergies Info: latex          Current Medications (09/08/2023):  This is the current hospital active medication list Current Facility-Administered Medications  Medication Dose Route Frequency Provider Last Rate Last Admin   acetaminophen (TYLENOL) tablet 650 mg  650 mg Oral Q6H PRN [name], MD   650 mg at 09/05/23 2100   Or   acetaminophen (TYLENOL) suppository 650 mg  650 mg Rectal Q6H PRN [name], MD       alum & mag hydroxide-simeth (MAALOX/MYLANTA) 200-200-20 MG/5ML suspension 15-30 mL  15-30 mL Oral Q2H PRN [name], MD       ascorbic acid (VITAMIN C)  tablet 250 mg  250 mg Oral Daily [place], Eric J, DO   250 mg at 09/08/23 0901   atorvastatin (LIPITOR) tablet 10 mg  10 mg Oral Daily [name], MD   10 mg at 09/08/23 0900   bisacodyl (DULCOLAX) EC tablet 5 mg  5 mg Oral Daily PRN [name], MD       enoxaparin (LOVENOX) injection 40 mg  40 mg Subcutaneous Daily [name], MD   40 mg at 09/08/23 0910   feeding supplement (GLUCERNA SHAKE) (GLUCERNA SHAKE) liquid 237 mL  237 mL Oral BID BM Kathlen Mody, MD   237 mL at 09/08/23 0833   FLUoxetine (PROZAC) capsule 30 mg  30 mg Oral Daily [name], MD   30 mg at 09/08/23 1610   levETIRAcetam (KEPPRA) tablet 500 mg  500 mg Oral BID [name], MD   500 mg at 09/08/23 0940   lip balm (CARMEX) ointment   Topical PRN Glade Lloyd, MD       loperamide (IMODIUM) capsule 2 mg  2 mg Oral PRN Candelaria Stagers T, MD   2 mg at 06/10/23 1549   metFORMIN (GLUCOPHAGE) tablet 500 mg  500 mg Oral BID WC Candelaria Stagers T, MD   500 mg at 09/08/23 9604   multivitamins with iron tablet 1 tablet  1 tablet Oral Daily Candelaria Stagers T, MD   1 tablet at 09/08/23 0832   ondansetron (ZOFRAN) tablet 4 mg  4 mg Oral Q6H PRN [name], MD       Or   ondansetron [facility]) injection 4 mg  4 mg Intravenous Q6H PRN [name], MD       Oral care mouth rinse  15 mL Mouth Rinse PRN Leatha Gilding, MD       pantoprazole (PROTONIX) EC tablet 40 mg  40 mg Oral Daily [name], MD   40 mg at 09/08/23 0832   polyethylene glycol (MIRALAX / GLYCOLAX) packet 17 g  17 g Oral Daily PRN [name], MD   17 g at 07/30/23 0914   risperiDONE (RISPERDAL) tablet 0.25 mg  0.25 mg Oral QHS [name], MD   0.25 mg at 09/07/23 2230     Discharge Medications: Please see discharge summary for a list of discharge medications.  Relevant Imaging Results:  Relevant Lab Results:   Additional Information SSN: [id number]  Lorri Frederick, LCSW

## 2023-09-08 NOTE — TOC Progression Note (Addendum)
Transition of Care [facility]) - Progression Note    Patient Details  Name: [name] MRN: [id number] Date of Birth: [birth year]  [facility]) CM/SW Contact  Lorri Frederick, LCSW Phone Number: 09/08/2023, 12:52 PM  Clinical Narrative:   CSW spoke with [facility], she needed one change to [facility], updated copy emailed to her.  She is able to receive pt tomorrow.    Sister updated.  Expected Discharge Plan: Group Home Barriers to Discharge: Other (must enter comment) (Adult protective services is working on placement)  Expected Discharge Plan and Services       Living arrangements for the past 2 months: Single Family Home                                       Social Determinants of Health (SDOH) Interventions SDOH Screenings   Depression (PHQ2-9): Low Risk  (03/11/2023)  Tobacco Use: Low Risk  (06/02/2023)    Readmission Risk Interventions     No data to display

## 2023-09-08 NOTE — Plan of Care (Signed)
  Problem: Education: Goal: Ability to describe self-care measures that may prevent or decrease complications (Diabetes Survival Skills Education) will improve Outcome: Adequate for Discharge   Problem: Education: Goal: Knowledge of General Education information will improve Description: Including pain rating scale, medication(s)/side effects and non-pharmacologic comfort measures Outcome: Adequate for Discharge   Problem: Health Behavior/Discharge Planning: Goal: Ability to manage health-related needs will improve Outcome: Adequate for Discharge   Problem: Clinical Measurements: Goal: Will remain free from infection Outcome: Adequate for Discharge   Problem: Activity: Goal: Risk for activity intolerance will decrease Outcome: Adequate for Discharge   Problem: Nutrition: Goal: Adequate nutrition will be maintained Outcome: Adequate for Discharge   Problem: Elimination: Goal: Will not experience complications related to bowel motility Outcome: Adequate for Discharge   Problem: Pain Managment: Goal: General experience of comfort will improve Outcome: Adequate for Discharge   Problem: Safety: Goal: Ability to remain free from injury will improve Outcome: Adequate for Discharge   Problem: Skin Integrity: Goal: Risk for impaired skin integrity will decrease Outcome: Adequate for Discharge

## 2023-09-08 NOTE — Progress Notes (Signed)
Mobility Specialist: Progress Note   09/08/23 1044  Mobility  Activity Ambulated with assistance in hallway  Level of Assistance Standby assist, set-up cues, supervision of patient - no hands on  Assistive Device None  Distance Ambulated (ft) 550 ft  LLE Weight Bearing WBAT  Activity Response Tolerated well  Mobility Referral Yes  $Mobility charge 1 Mobility  Mobility Specialist Start Time (ACUTE ONLY) [id number]  Mobility Specialist Stop Time (ACUTE ONLY) 1003  Mobility Specialist Time Calculation (min) (ACUTE ONLY) 7 min    Pt was agreeable to mobility session - received in bed. Interpreter Bonnye Fava was present and helpful throughout session. SV throughout. Able to don/doff shoes independently. Practiced cross body reaching activity during ambulation - tolerated well, required few verbal cues to reach with opposite hand and continue walking throughout activity. Observed that pt is having a harder time reaching across with left hand to right side of body than opposite hand/side. Returned to room without fault. Left in bed with all needs met, call bell in reach. Sister in room.   [name] Mobility Specialist Please contact via SecureChat or Rehab office at [phone]

## 2023-09-08 NOTE — Plan of Care (Signed)
Problem: Education: Goal: Ability to describe self-care measures that may prevent or decrease complications (Diabetes Survival Skills Education) will improve Outcome: Not Progressing Goal: Individualized Educational Video(s) Outcome: Not Progressing   Problem: Coping: Goal: Ability to adjust to condition or change in health will improve Outcome: Not Progressing   Problem: Health Behavior/Discharge Planning: Goal: Ability to identify and utilize available resources and services will improve Outcome: Not Progressing Goal: Ability to manage health-related needs will improve Outcome: Not Progressing   Problem: Metabolic: Goal: Ability to maintain appropriate glucose levels will improve Outcome: Not Progressing   Problem: Nutritional: Goal: Progress toward achieving an optimal weight will improve Outcome: Not Progressing   Problem: Skin Integrity: Goal: Risk for impaired skin integrity will decrease Outcome: Not Progressing   Problem: Tissue Perfusion: Goal: Adequacy of tissue perfusion will improve Outcome: Not Progressing   Problem: Education: Goal: Knowledge of General Education information will improve Description: Including pain rating scale, medication(s)/side effects and non-pharmacologic comfort measures Outcome: Not Progressing   Problem: Health Behavior/Discharge Planning: Goal: Ability to manage health-related needs will improve Outcome: Not Progressing   Problem: Clinical Measurements: Goal: Ability to maintain clinical measurements within normal limits will improve Outcome: Not Progressing Goal: Will remain free from infection Outcome: Not Progressing Goal: Diagnostic test results will improve Outcome: Not Progressing Goal: Respiratory complications will improve Outcome: Not Progressing Goal: Cardiovascular complication will be avoided Outcome: Not Progressing   Problem: Activity: Goal: Risk for activity intolerance will decrease Outcome: Not  Progressing   Problem: Nutrition: Goal: Adequate nutrition will be maintained Outcome: Not Progressing   Problem: Coping: Goal: Level of anxiety will decrease Outcome: Not Progressing   Problem: Elimination: Goal: Will not experience complications related to bowel motility Outcome: Not Progressing Goal: Will not experience complications related to urinary retention Outcome: Not Progressing   Problem: Pain Managment: Goal: General experience of comfort will improve Outcome: Not Progressing   Problem: Safety: Goal: Ability to remain free from injury will improve Outcome: Not Progressing   Problem: Skin Integrity: Goal: Risk for impaired skin integrity will decrease Outcome: Not Progressing   Problem: Education: Goal: Knowledge of General Education information will improve Description: Including pain rating scale, medication(s)/side effects and non-pharmacologic comfort measures Outcome: Not Progressing   Problem: Health Behavior/Discharge Planning: Goal: Ability to manage health-related needs will improve Outcome: Not Progressing   Problem: Clinical Measurements: Goal: Ability to maintain clinical measurements within normal limits will improve Outcome: Not Progressing Goal: Will remain free from infection Outcome: Not Progressing Goal: Diagnostic test results will improve Outcome: Not Progressing Goal: Respiratory complications will improve Outcome: Not Progressing Goal: Cardiovascular complication will be avoided Outcome: Not Progressing   Problem: Activity: Goal: Risk for activity intolerance will decrease Outcome: Not Progressing   Problem: Nutrition: Goal: Adequate nutrition will be maintained Outcome: Not Progressing   Problem: Coping: Goal: Level of anxiety will decrease Outcome: Not Progressing   Problem: Elimination: Goal: Will not experience complications related to bowel motility Outcome: Not Progressing Goal: Will not experience complications  related to urinary retention Outcome: Not Progressing   Problem: Pain Managment: Goal: General experience of comfort will improve Outcome: Not Progressing   Problem: Safety: Goal: Ability to remain free from injury will improve Outcome: Not Progressing   Problem: Skin Integrity: Goal: Risk for impaired skin integrity will decrease Outcome: Not Progressing   Problem: Education: Goal: Knowledge of the prescribed therapeutic regimen will improve Outcome: Not Progressing Goal: Ability to verbalize activity precautions or restrictions will improve Outcome: Not Progressing Goal:  Understanding of discharge needs will improve Outcome: Not Progressing   Problem: Activity: Goal: Ability to perform//tolerate increased activity and mobilize with assistive devices will improve Outcome: Not Progressing

## 2023-09-08 NOTE — Progress Notes (Signed)
[facility]  PROGRESS NOTE  Martyn Timme WJX:[id number] DOB: May 05, 1966 DOA: 05/26/2023 PCP: Marcine Matar, MD   Brief HPI:   57 y.o. 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 He underwent left TMA with wound VAC by Dr. Lajoyce Corners on 06/02/2023. Therapy recommended home health but patient has no safe disposition. Per sister's report to [facility], "he was staying with some people because he couldn't stay with sister anymore due to violent behaviors. Sister was bringing food and they were letting him stay for free. And now those people are saying he can't come back and sister can't take him either". TOC filed APS report. Now a placement issue. Wound VAC has been removed, sutures removed 9/09.     Assessment/Plan:   Left foot cellulitis with gangrene/osteomyelitis -MRI with osteo of the 2nd toe and plantar muscles suggesting myositis He was seen by the vascular surgeon and there was no indication for vascular intervention. -S/p left TMA and wound VAC placement by Dr. Lajoyce Corners on 7/31. Wound VAC has been removed, sutures removed 9/09.  -Per Ortho, surgical source control achieved.  Antibiotics discontinued. -Analgesics as needed for pain. -Stable for discharge to ALF tomorrow    H/o TBI/cognitive impairment and intermittent aggressive behaviors Depression -Continue Risperdal, Prozac  -Psychiatry evaluation appreciated: Patient does not have capacity   Unsafe living situation: Sister says living in a filthy, unsafe living situation.  Patient with TBI/cognitive impairment.  Has no capacity to make medical decision. -TOC filed APS report.   Controlled DM-2 with hyperglycemia:  -Hemoglobin A1c was 5.8% on 05/27/2023.  Continue metformin and carb modified diet   Normocytic anemia: H&H stable, 11.8 today.  Anemia panel  suggests anemia of chronic disease.  Monitor intermittently.   Fall in the hospital: No apparent injury or acute focal neurodeficit. -Fall precaution   Seizure disorder  -Continue Keppra    Recurrent hyponatremia: Resolved   Hypertension -Blood pressure stable.  Currently not on any antihypertensives   Hyperlipidemia -Continue statin   GERD -Continue Protonix    Medications     vitamin C  250 mg Oral Daily   atorvastatin  10 mg Oral Daily   enoxaparin (LOVENOX) injection  40 mg Subcutaneous Daily   feeding supplement (GLUCERNA SHAKE)  237 mL Oral BID BM   FLUoxetine  30 mg Oral Daily   levETIRAcetam  500 mg Oral BID   metFORMIN  500 mg Oral BID WC   multivitamins with iron  1 tablet Oral Daily   pantoprazole  40 mg Oral Daily   risperiDONE  0.25 mg Oral QHS     Data Reviewed:   CBG:  No results for input(s): "GLUCAP" in the last 168 hours.  SpO2: 96 %    Vitals:   09/07/23 0707 09/07/23 1502 09/07/23 2140 09/08/23 0722  BP: 123/69 130/71 123/67 (!) 141/82  Pulse: 73 74 72 68  Resp: 18 18 16 18   Temp: 98.5 F (36.9 C) 98 F (36.7 C) 98.2 F (36.8 C) 98.2 F (36.8 C)  TempSrc: Oral Oral Oral Oral  SpO2: 95% 95% 98% 96%  Weight:      Height:          Data Reviewed:  Basic Metabolic Panel: No results for input(s): "NA", "K", "CL", "CO2", "GLUCOSE", "BUN", "CREATININE", "CALCIUM", "MG", "PHOS" in the last 168 hours.  CBC:  No results for input(s): "WBC", "NEUTROABS", "HGB", "HCT", "MCV", "PLT" in the last 168 hours.  LFT No results for input(s): "AST", "ALT", "ALKPHOS", "BILITOT", "PROT", "ALBUMIN" in the last 168 hours.   Antibiotics: Anti-infectives (From admission, onward)    Start     Dose/Rate Route Frequency Ordered Stop   06/03/23 0600  ceFAZolin (ANCEF) IVPB 2g/100 mL premix        2 g 200 mL/hr over 30 Minutes Intravenous On call to O.R. 06/02/23 1149 06/02/23 1330   06/02/23 2200  ceFAZolin (ANCEF) IVPB 2g/100 mL premix        2  g 200 mL/hr over 30 Minutes Intravenous Every 8 hours 06/02/23 1514 06/03/23 0639   06/02/23 1142  ceFAZolin (ANCEF) 2-4 GM/100ML-% IVPB       Note to Pharmacy: [facility] [name]: cabinet override      06/02/23 1142 06/02/23 1330   06/01/23 1715  metroNIDAZOLE (FLAGYL) IVPB 500 mg  Status:  Discontinued        500 mg 100 mL/hr over 60 Minutes Intravenous Every 12 hours 06/01/23 1615 06/02/23 1515   05/30/23 0800  ceFAZolin (ANCEF) IVPB 2g/100 mL premix        2 g 200 mL/hr over 30 Minutes Intravenous On call to O.R. 05/30/23 0412 05/30/23 1209   05/28/23 1500  vancomycin (VANCOREADY) IVPB 1250 mg/250 mL  Status:  Discontinued        1,250 mg 166.7 mL/hr over 90 Minutes Intravenous Every 24 hours 05/28/23 1211 06/02/23 1515   05/28/23 1145  vancomycin (VANCOREADY) IVPB 1250 mg/250 mL  Status:  Discontinued        1,250 mg 166.7 mL/hr over 90 Minutes Intravenous Every 24 hours 05/28/23 1051 05/28/23 1211   05/26/23 2030  cefTRIAXone (ROCEPHIN) 2 g in sodium chloride 0.9 % 100 mL IVPB        2 g 200 mL/hr over 30 Minutes Intravenous Every 24 hours 05/26/23 2026 06/01/23 1845        DVT prophylaxis: Lovenox  Code Status: Full code  Family Communication: Discussed with patient's sister at bedside  CONSULTS    Subjective   Patient seen, denies any complaints.   Objective    Physical Examination:   General-appears in no acute distress Heart-S1-S2, regular, no murmur auscultated Lungs-clear to auscultation bilaterally, no wheezing or crackles auscultated Abdomen-soft, nontender, no organomegaly Extremities-no edema in the lower extremities Neuro-alert, oriented x3, no focal deficit noted   Status is: Inpatient:             Meredeth Ide   [facility] If 7PM-7AM, please contact night-coverage at [email or web address], Office  [phone]   09/08/2023, 9:30 AM  LOS: 105 days

## 2023-09-09 ENCOUNTER — Inpatient Hospital Stay (HOSPITAL_COMMUNITY): Payer: Self-pay

## 2023-09-09 ENCOUNTER — Other Ambulatory Visit: Payer: Self-pay

## 2023-09-09 MED ORDER — PANTOPRAZOLE SODIUM 40 MG PO TBEC: 40.0000 mg | DELAYED_RELEASE_TABLET | Freq: Every day | ORAL | Status: AC

## 2023-09-09 MED ORDER — POLYETHYLENE GLYCOL 3350 17 G PO PACK
17.0000 g | PACK | Freq: Every day | ORAL | 0 refills | Status: AC | PRN
  Filled 2023-09-09: qty 14, 14d supply, fill #0

## 2023-09-09 NOTE — TOC Transition Note (Addendum)
Transition of Care [facility]) - CM/SW Discharge Note   Patient Details  Name: [name] MRN: [id number] Date of Birth: 1966/02/06  [facility] Care [facility]) CM/SW Contact:  Lorri Frederick, LCSW Phone Number: 09/09/2023, 10:26 AM   Clinical Narrative:   Pt discharging to [facility].  RN call report to [phone].  Please make sure chest xray is completed to rule out TB before calling report.  [name] will be here to transport pt between 2-3pm.     Final next level of care: Assisted Living Barriers to Discharge: Barriers Resolved   Patient Goals and CMS Choice      Discharge Placement                Patient chooses bed at:  [facility]) Patient to be transferred to facility by: sister [name] Name of family member notified: sister Karna Christmas, APS worker [name] Patient and family notified of of transfer: 09/09/23  Discharge Plan and Services Additional resources added to the After Visit Summary for                                       Social Determinants of Health (SDOH) Interventions SDOH Screenings   Depression (PHQ2-9): Low Risk  (03/11/2023)  Tobacco Use: Low Risk  (06/02/2023)     Readmission Risk Interventions     No data to display

## 2023-09-09 NOTE — Plan of Care (Signed)
Problem: Education: Goal: Ability to describe self-care measures that may prevent or decrease complications (Diabetes Survival Skills Education) will improve Outcome: Not Progressing Goal: Individualized Educational Video(s) Outcome: Not Progressing   Problem: Coping: Goal: Ability to adjust to condition or change in health will improve Outcome: Not Progressing   Problem: Health Behavior/Discharge Planning: Goal: Ability to identify and utilize available resources and services will improve Outcome: Not Progressing Goal: Ability to manage health-related needs will improve Outcome: Not Progressing   Problem: Metabolic: Goal: Ability to maintain appropriate glucose levels will improve Outcome: Not Progressing   Problem: Nutritional: Goal: Progress toward achieving an optimal weight will improve Outcome: Not Progressing   Problem: Skin Integrity: Goal: Risk for impaired skin integrity will decrease Outcome: Not Progressing   Problem: Tissue Perfusion: Goal: Adequacy of tissue perfusion will improve Outcome: Not Progressing   Problem: Education: Goal: Knowledge of General Education information will improve Description: Including pain rating scale, medication(s)/side effects and non-pharmacologic comfort measures Outcome: Not Progressing   Problem: Health Behavior/Discharge Planning: Goal: Ability to manage health-related needs will improve Outcome: Not Progressing   Problem: Clinical Measurements: Goal: Ability to maintain clinical measurements within normal limits will improve Outcome: Not Progressing Goal: Will remain free from infection Outcome: Not Progressing Goal: Diagnostic test results will improve Outcome: Not Progressing Goal: Respiratory complications will improve Outcome: Not Progressing Goal: Cardiovascular complication will be avoided Outcome: Not Progressing   Problem: Activity: Goal: Risk for activity intolerance will decrease Outcome: Not  Progressing   Problem: Nutrition: Goal: Adequate nutrition will be maintained Outcome: Not Progressing   Problem: Coping: Goal: Level of anxiety will decrease Outcome: Not Progressing   Problem: Elimination: Goal: Will not experience complications related to bowel motility Outcome: Not Progressing Goal: Will not experience complications related to urinary retention Outcome: Not Progressing   Problem: Pain Managment: Goal: General experience of comfort will improve Outcome: Not Progressing   Problem: Safety: Goal: Ability to remain free from injury will improve Outcome: Not Progressing   Problem: Skin Integrity: Goal: Risk for impaired skin integrity will decrease Outcome: Not Progressing   Problem: Education: Goal: Knowledge of General Education information will improve Description: Including pain rating scale, medication(s)/side effects and non-pharmacologic comfort measures Outcome: Not Progressing   Problem: Health Behavior/Discharge Planning: Goal: Ability to manage health-related needs will improve Outcome: Not Progressing   Problem: Clinical Measurements: Goal: Ability to maintain clinical measurements within normal limits will improve Outcome: Not Progressing Goal: Will remain free from infection Outcome: Not Progressing Goal: Diagnostic test results will improve Outcome: Not Progressing Goal: Respiratory complications will improve Outcome: Not Progressing Goal: Cardiovascular complication will be avoided Outcome: Not Progressing   Problem: Activity: Goal: Risk for activity intolerance will decrease Outcome: Not Progressing   Problem: Nutrition: Goal: Adequate nutrition will be maintained Outcome: Not Progressing   Problem: Coping: Goal: Level of anxiety will decrease Outcome: Not Progressing   Problem: Elimination: Goal: Will not experience complications related to bowel motility Outcome: Not Progressing Goal: Will not experience complications  related to urinary retention Outcome: Not Progressing   Problem: Pain Managment: Goal: General experience of comfort will improve Outcome: Not Progressing   Problem: Safety: Goal: Ability to remain free from injury will improve Outcome: Not Progressing   Problem: Skin Integrity: Goal: Risk for impaired skin integrity will decrease Outcome: Not Progressing   Problem: Education: Goal: Knowledge of the prescribed therapeutic regimen will improve Outcome: Not Progressing Goal: Ability to verbalize activity precautions or restrictions will improve Outcome: Not Progressing Goal:  Understanding of discharge needs will improve Outcome: Not Progressing   Problem: Activity: Goal: Ability to perform//tolerate increased activity and mobilize with assistive devices will improve Outcome: Not Progressing

## 2023-09-09 NOTE — TOC Progression Note (Addendum)
Transition of Care [facility]) - Progression Note    Patient Details  Name: [name] MRN: [id number] Date of Birth: 08-Nov-1965  Transition of Care [facility]) CM/SW Contact  Lorri Frederick, LCSW Phone Number: 09/09/2023, 8:44 AM  Clinical Narrative:   CSW spoke with pt sister [name] regarding transportation.  She can bring pt to ALF, has MD appt, can be here around 2pm.  She asked CSW to confirm that she will not be the one signing him into the facility.  CSW spoke with Angela/APS.  She confirmed they will handle paperwork.  Discussed transport and she thought [facility] would transport pt.    1000: CSW spoke with [employer].  DSS will handle the paperwork.  They cannot come pick pt up, need hospital to arrange.  Need TB test or chest xray.  MD informed, will do xray.   DSS updated that sister will transport.   Expected Discharge Plan: Group Home Barriers to Discharge: Other (must enter comment) (Adult protective services is working on placement)  Expected Discharge Plan and Services       Living arrangements for the past 2 months: Single Family Home                                       Social Determinants of Health (SDOH) Interventions SDOH Screenings   Depression (PHQ2-9): Low Risk  (03/11/2023)  Tobacco Use: Low Risk  (06/02/2023)    Readmission Risk Interventions     No data to display

## 2023-09-09 NOTE — Discharge Summary (Addendum)
Physician Discharge Summary   Patient: [name] MRN: [id number] DOB: [birth year]  Admit date:     05/26/2023  Discharge date: 09/09/23  Discharge Physician: Meredeth Ide   PCP: Marcine Matar, MD   Recommendations at discharge:   Patient will be discharged to a assisted living facility  Discharge Diagnoses: Principal Problem:   Cellulitis of left foot Active Problems:   Essential hypertension   Type 2 diabetes mellitus with peripheral neuropathy (HCC)   Seizure disorder (HCC)   MDD (major depressive disorder), recurrent episode, moderate (HCC)   History of traumatic brain injury   Cutaneous abscess of left foot   PAD (peripheral artery disease) (HCC)   Severe protein-calorie malnutrition (HCC)  Resolved Problems:   * No resolved hospital problems. *  Hospital Course:  57 y.o. 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 He underwent left TMA with wound VAC by Dr. Lajoyce Corners on 06/02/2023. Therapy recommended home health but patient has no safe disposition. Per sister's report to [facility], "he was staying with some people because he couldn't stay with sister anymore due to violent behaviors. Sister was bringing food and they were letting him stay for free. And now those people are saying he can't come back and sister can't take him either". TOC filed APS report. Now a placement issue. Wound VAC has been removed, sutures removed 9/09.   Assessment and Plan:  Left foot cellulitis with gangrene/osteomyelitis -MRI with osteo of the 2nd toe and plantar muscles suggesting myositis He was seen by the vascular surgeon and there was no indication for vascular intervention. -S/p left TMA and wound VAC placement by Dr. Lajoyce Corners on 7/31. Wound VAC has been removed, sutures removed 9/09.  -Per Ortho, surgical source  control achieved.  Antibiotics discontinued. -Analgesics as needed for pain. -Stable for discharge to ALF     H/o TBI/cognitive impairment and intermittent aggressive behaviors Depression -Continue Risperdal, Prozac  -Psychiatry evaluation appreciated: Patient does not have capacity   Unsafe living situation: Sister says living in a filthy, unsafe living situation.  Patient with TBI/cognitive impairment.  Has no capacity to make medical decision. -TOC filed APS report.   Controlled DM-2 with hyperglycemia:  -Hemoglobin A1c was 5.8% on 05/27/2023.   Continue metformin and carb modified diet   Normocytic anemia: H&H stable, 11.8 today.  Anemia panel suggests anemia of chronic disease.     Fall in the hospital: No apparent injury or acute focal neurodeficit. -Fall precaution   Seizure disorder  -Continue Keppra 750 mg p.o. twice daily    Recurrent hyponatremia: Resolved   Hypertension -Blood pressure stable.  Currently not on any antihypertensives   Hyperlipidemia -Continue statin   GERD -Continue Protonix   Chest x-ray obtained today was unremarkable, no infiltrate noted.  No active tuberculosis infection      Consultants: Psychiatry, orthopedics Procedures performed: Left TMA, wound VAC placement Disposition: Assisted living Diet recommendation:  Discharge Diet Orders (From admission, onward)     Start     Ordered   09/09/23 0000  Diet - low sodium heart healthy        09/09/23 1415           Carb modified diet DISCHARGE MEDICATION: Allergies as of 09/09/2023       Reactions   Latex Itching, Rash  Medication List     STOP taking these medications    lisinopril 2.5 MG tablet Commonly known as: ZESTRIL       TAKE these medications    atorvastatin 10 MG tablet Commonly known as: LIPITOR Tome 1 tableta (10 mg en total) por va oral diariamente. (Take 1 tablet (10 mg total) by mouth daily.)   ferrous sulfate 324 MG Tbec Take 324 mg by  mouth daily with breakfast.   FLUoxetine 10 MG capsule Commonly known as: PROZAC Take 3 capsules (30 mg total) by mouth daily.   hydrOXYzine 10 MG tablet Commonly known as: ATARAX Take 1 tablet (10 mg total) by mouth 3 (three) times daily as needed. What changed: reasons to take this   levETIRAcetam 500 MG tablet Commonly known as: KEPPRA Take 1.5 tablets (750mg ) by mouth twice daily. What changed: how much to take   metFORMIN 1000 MG tablet Commonly known as: GLUCOPHAGE Take 1 tablet (1,000 mg total) by mouth 2 (two) times daily with a meal.   pantoprazole 40 MG tablet Commonly known as: PROTONIX Take 1 tablet (40 mg total) by mouth daily. Start taking on: September 10, 2023   polyethylene glycol 17 g packet Commonly known as: MIRALAX / GLYCOLAX Take 17 g by mouth daily as needed for mild constipation.   risperiDONE 0.25 MG tablet Commonly known as: RISPERDAL Take 1 tablet (0.25 mg total) by mouth at bedtime.   vitamin B-12 100 MCG tablet Commonly known as: CYANOCOBALAMIN Take 100 mcg by mouth daily.               Discharge Care Instructions  (From admission, onward)           Start     Ordered   06/03/23 0000  Change dressing       Comments: At time of discharge remove the incisional wound VAC dressing left foot.  Apply 4 x 4 gauze plus ABD pad and an Ace wrap.   06/03/23 1819            Follow-up Information     Marcine Matar, MD Follow up.   Specialty: Internal Medicine Contact information: [address] [place] [address] [phone]         [name], MD Follow up in 1 week(s).   Specialty: Orthopedic Surgery Contact information: [address] [place] [address] [phone]         [facility] Follow up.   Why: home health services wil be provided by [facility], start of care within 48 hours post discharge Contact information: [address] [place] [place]  [address] [phone]                Discharge Exam: Filed Weights   05/26/23 2321 09/02/23 0732  Weight: 54.6 kg 63.2 kg   General-appears in no acute distress Heart-S1-S2, regular, no murmur auscultated Lungs-clear to auscultation bilaterally, no wheezing or crackles auscultated Abdomen-soft, nontender, no organomegaly Extremities-no edema in the lower extremities Neuro-alert, oriented x3, no focal deficit noted  Condition at discharge: good  The results of significant diagnostics from this hospitalization (including imaging, microbiology, ancillary and laboratory) are listed below for reference.   Imaging Studies: DG Chest 2 View  Result Date: 09/09/2023 CLINICAL DATA:  Tuberculosis screening EXAM: CHEST - 2 VIEW COMPARISON:  Chest x-ray dated May 26, 2023 FINDINGS: The heart size and mediastinal contours are within normal limits. Mild subsegmental atelectasis of  the left lower lobe. Both lungs are otherwise clear. The visualized skeletal structures are unremarkable. IMPRESSION: No evidence of active tuberculosis infection. Electronically Signed   By: Allegra Lai M.D.   On: 09/09/2023 14:11    Microbiology: Results for orders placed or performed during the hospital encounter of 05/26/23  Blood Culture (routine x 2)     Status: None   Collection Time: 05/26/23  8:23 PM   Specimen: BLOOD RIGHT FOREARM  Result Value Ref Range Status   Specimen Description BLOOD RIGHT FOREARM  Final   Special Requests   Final    BOTTLES DRAWN AEROBIC AND ANAEROBIC Blood Culture adequate volume   Culture   Final    NO GROWTH 5 DAYS Performed at [facility], [address] [place], [place] [address]    Report Status 05/31/2023 FINAL  Final  Resp panel by RT-PCR (RSV, Flu A&B, Covid) Anterior Nasal Swab     Status: None   Collection Time: 05/26/23  8:26 PM   Specimen: Anterior Nasal Swab  Result Value Ref Range Status   SARS Coronavirus 2 by RT PCR NEGATIVE NEGATIVE Final    Influenza A by PCR NEGATIVE NEGATIVE Final   Influenza B by PCR NEGATIVE NEGATIVE Final    Comment: (NOTE) The Xpert Xpress SARS-CoV-2/FLU/RSV plus assay is intended as an aid in the diagnosis of influenza from Nasopharyngeal swab specimens and should not be used as a sole basis for treatment. Nasal washings and aspirates are unacceptable for Xpert Xpress SARS-CoV-2/FLU/RSV testing.  Fact Sheet for Patients: BloggerCourse.com  Fact Sheet for Healthcare Providers: SeriousBroker.it  This test is not yet approved or cleared by the [place] FDA and has been authorized for detection and/or diagnosis of SARS-CoV-2 by FDA under an Emergency Use Authorization (EUA). This EUA will remain in effect (meaning this test can be used) for the duration of the COVID-19 declaration under Section 564(b)(1) of the Act, 21 U.S.C. section 360bbb-3(b)(1), unless the authorization is terminated or revoked.     Resp Syncytial Virus by PCR NEGATIVE NEGATIVE Final    Comment: (NOTE) Fact Sheet for Patients: BloggerCourse.com  Fact Sheet for Healthcare Providers: SeriousBroker.it  This test is not yet approved or cleared by the [place] FDA and has been authorized for detection and/or diagnosis of SARS-CoV-2 by FDA under an Emergency Use Authorization (EUA). This EUA will remain in effect (meaning this test can be used) for the duration of the COVID-19 declaration under Section 564(b)(1) of the Act, 21 U.S.C. section 360bbb-3(b)(1), unless the authorization is terminated or revoked.  Performed at [facility], [address], [place], [place] [address]   Blood Culture (routine x 2)     Status: None   Collection Time: 05/26/23  8:31 PM   Specimen: BLOOD  Result Value Ref Range Status   Specimen Description BLOOD RIGHT ANTECUBITAL  Final   Special Requests   Final    BOTTLES DRAWN  AEROBIC AND ANAEROBIC Blood Culture adequate volume   Culture   Final    NO GROWTH 5 DAYS Performed at [facility], [address] [place], [place] [address]    Report Status 05/31/2023 FINAL  Final  Surgical pcr screen     Status: Abnormal   Collection Time: 05/27/23  9:11 PM   Specimen: Nasal Mucosa; Nasal Swab  Result Value Ref Range Status   MRSA, PCR NEGATIVE NEGATIVE Final   Staphylococcus aureus POSITIVE (A) NEGATIVE Final    Comment: (NOTE)  The Xpert SA Assay (FDA approved for NASAL specimens in patients 33 years of age and older), is one component of a comprehensive surveillance program. It is not intended to diagnose infection nor to guide or monitor treatment. Performed at [facility], [address], [place], [place] [address]     Labs: CBC: No results for input(s): "WBC", "NEUTROABS", "HGB", "HCT", "MCV", "PLT" in the last 168 hours. Basic Metabolic Panel: No results for input(s): "NA", "K", "CL", "CO2", "GLUCOSE", "BUN", "CREATININE", "CALCIUM", "MG", "PHOS" in the last 168 hours. Liver Function Tests: No results for input(s): "AST", "ALT", "ALKPHOS", "BILITOT", "PROT", "ALBUMIN" in the last 168 hours. CBG: No results for input(s): "GLUCAP" in the last 168 hours.  Discharge time spent: greater than 30 minutes.  Signed: Meredeth Ide, MD [facility] 09/09/2023

## 2023-09-09 NOTE — Progress Notes (Signed)
Mobility Specialist: Progress Note   09/09/23 1024  Mobility  Activity Ambulated with assistance in hallway  Level of Assistance Standby assist, set-up cues, supervision of patient - no hands on  Assistive Device None  Distance Ambulated (ft) 550 ft  LLE Weight Bearing WBAT  Activity Response Tolerated well  Mobility Referral Yes  $Mobility charge 1 Mobility  Mobility Specialist Start Time (ACUTE ONLY) 1000  Mobility Specialist Stop Time (ACUTE ONLY) 1014  Mobility Specialist Time Calculation (min) (ACUTE ONLY) 14 min    Pt was agreeable - received in bed. Interpreter Bonnye Fava was present and helpful throughout session. Pt able to don/doff both tennis shoes. SV throughout. Returned to room without fault. Left in bed with all needs met, call bell in reach. Bed alarm on.   [name] Mobility Specialist Please contact via SecureChat or Rehab office at [phone]

## 2023-09-09 NOTE — Progress Notes (Signed)
Report given to [facility]. All questions were answered.

## 2023-11-11 ENCOUNTER — Other Ambulatory Visit: Payer: Self-pay
